# Patient Record
Sex: Male | Born: 1965
Health system: Southern US, Community
[De-identification: ages and names within clinical notes are randomized; demographics above are authoritative.]

## PROBLEM LIST (undated history)

## (undated) DIAGNOSIS — K219 Gastro-esophageal reflux disease without esophagitis: Secondary | ICD-10-CM

## (undated) DIAGNOSIS — R0600 Dyspnea, unspecified: Secondary | ICD-10-CM

## (undated) DIAGNOSIS — E119 Type 2 diabetes mellitus without complications: Secondary | ICD-10-CM

## (undated) DIAGNOSIS — I10 Essential (primary) hypertension: Secondary | ICD-10-CM

## (undated) DIAGNOSIS — J449 Chronic obstructive pulmonary disease, unspecified: Secondary | ICD-10-CM

## (undated) DIAGNOSIS — Z720 Tobacco use: Secondary | ICD-10-CM

## (undated) DIAGNOSIS — I509 Heart failure, unspecified: Secondary | ICD-10-CM

## (undated) DIAGNOSIS — I2510 Atherosclerotic heart disease of native coronary artery without angina pectoris: Secondary | ICD-10-CM

## (undated) DIAGNOSIS — I2102 ST elevation (STEMI) myocardial infarction involving left anterior descending coronary artery: Secondary | ICD-10-CM

## (undated) DIAGNOSIS — F1410 Cocaine abuse, uncomplicated: Secondary | ICD-10-CM

---

## 1998-04-16 ENCOUNTER — Emergency Department (HOSPITAL_COMMUNITY): Admission: EM | Admit: 1998-04-16 | Discharge: 1998-04-16 | Payer: Self-pay | Admitting: Emergency Medicine

## 2005-11-15 ENCOUNTER — Emergency Department (HOSPITAL_COMMUNITY): Admission: EM | Admit: 2005-11-15 | Discharge: 2005-11-15 | Payer: Self-pay | Admitting: Emergency Medicine

## 2005-11-16 ENCOUNTER — Ambulatory Visit (HOSPITAL_COMMUNITY): Admission: RE | Admit: 2005-11-16 | Discharge: 2005-11-16 | Payer: Self-pay | Admitting: *Deleted

## 2010-11-17 ENCOUNTER — Emergency Department (HOSPITAL_COMMUNITY)
Admission: EM | Admit: 2010-11-17 | Discharge: 2010-11-18 | Payer: Self-pay | Source: Home / Self Care | Admitting: Emergency Medicine

## 2011-03-10 LAB — HEPATITIS C ANTIBODY (REFLEX)

## 2011-03-10 LAB — HIV ANTIBODY (ROUTINE TESTING W REFLEX): HIV: NONREACTIVE

## 2011-03-10 LAB — HEPATITIS B SURFACE ANTIGEN: Hepatitis B Surface Ag: NEGATIVE

## 2011-05-15 NOTE — Op Note (Signed)
Tyler Wong, Tyler Wong NO.:  [id number]   MEDICAL RECORD NO.:  [id number]          PATIENT TYPE:  AMB   LOCATION:  SDS                          FACILITY:  [facility]   PHYSICIAN:  Tennis Must Meyerdierks, M.D.DATE OF BIRTH:  [birth year]   DATE OF PROCEDURE:  11/16/2005  DATE OF DISCHARGE:  11/16/2005                                 OPERATIVE REPORT   PREOPERATIVE DIAGNOSIS:  Lacerated flexor digitorum profundus and  superficialis tendons with lacerated ulnar digital nerve, right small  finger.   POSTOPERATIVE DIAGNOSIS:  Lacerated flexor digitorum profundus and  superficialis tendons with lacerated ulnar digital nerve, right small  finger.   PROCEDURE:  Repair of flexor digitorum profundus and superficialis tendons  with repair of ulnar digital nerve, right small finger.   SURGEON:  Lowell Bouton, M.D.   ANESTHESIA:  General.   OPERATIVE FINDINGS:  The patient had complete transection of the tendons at  the level of A3.  The ulnar digital nerve was divided at the same level and  the radial neurovascular bundle was intact.  The ulnar digital artery was  also transected, but was not repaired.   PROCEDURE:  Under general anesthesia, with a tourniquet on the right arm,  the right hand was prepped and draped in the usual fashion.  After elevating  the limb, the tourniquet was inflated to 250 mmHg.  The previous sutures  were removed from a transverse laceration over the proximal phalanx; the  laceration was extended in a zigzag fashion proximally and distally.  Blunt  dissection was carried down to the flexor tendon sheath.  The tendons were  retrieved proximally with a fine hemostat and held out to length with a 21-  gauge needle in the tendon sheath.  The distal end of the tendons were  identified after flexing the tip and a transverse incision was made at the  level of the proximal end of A2 to expose the stump of the profundus and  superficialis.  The  proximal quarter of the A2 was opened to allow for the  repair.  A 3-0 Ethibond Kessler suture was placed in the profundus stump  distally.  A 3entered the hospital Kessler suture was placed in the  profundus proximally and then in the superficialis proximally.  Those  tendons were then passed beneath the remaining pulley at A3, out to the  level of the repair.  The superficialis was repaired using the Kessler type  suture of 3-0 Ethibond.  The profundus had a core suture inserted in  addition to the Kessler sutures using a 3-0 Ethibond and the repair was  performed without difficulty.  There was good gliding of the tendon beneath  the pulleys.  The microscope was then brought into the field and the ulnar  digital nerve was identified.  It was trimmed back with a scissors to good  fascicles.  A 9-0 nylon suture was used to perform an epineurial repair.  Five sutures were inserted in the nerve.  The microscope was then brought  out of the field, the radial digit to the ulnar  digital artery was  coagulated and the radial neurovascular bundle was intact.  The wound  was irrigated with saline and the skin was closed with 4-0 nylon sutures.  Sterile dressings were applied followed by a dorsal splint.  The patient  tolerated the procedure well.  He was given a 0.5% Marcaine digital block  for pain control.  He went to the recovery room, awake and stable, in good  condition.      Lowell Bouton, M.D.  Electronically Signed     EMM/MEDQ  D:  11/16/2005  T:  11/17/2005  Job:  [id number]

## 2014-07-17 ENCOUNTER — Emergency Department (HOSPITAL_COMMUNITY): Payer: Self-pay

## 2014-07-17 ENCOUNTER — Encounter (HOSPITAL_COMMUNITY)
Admission: EM | Disposition: A | Discharge status: Home / Self Care | Payer: Self-pay | Source: Home / Self Care | Attending: Cardiovascular Disease

## 2014-07-17 ENCOUNTER — Encounter (HOSPITAL_COMMUNITY): Payer: Self-pay | Admitting: Emergency Medicine

## 2014-07-17 ENCOUNTER — Inpatient Hospital Stay (HOSPITAL_COMMUNITY)
Admission: EM | Admit: 2014-07-17 | Discharge: 2014-07-20 | DRG: 246 | Disposition: A | Discharge status: Home / Self Care | Payer: Self-pay | Attending: Cardiovascular Disease | Admitting: Cardiovascular Disease

## 2014-07-17 DIAGNOSIS — Z7982 Long term (current) use of aspirin: Secondary | ICD-10-CM

## 2014-07-17 DIAGNOSIS — Z720 Tobacco use: Secondary | ICD-10-CM

## 2014-07-17 DIAGNOSIS — Z8249 Family history of ischemic heart disease and other diseases of the circulatory system: Secondary | ICD-10-CM

## 2014-07-17 DIAGNOSIS — I5021 Acute systolic (congestive) heart failure: Secondary | ICD-10-CM | POA: Diagnosis present

## 2014-07-17 DIAGNOSIS — I2582 Chronic total occlusion of coronary artery: Secondary | ICD-10-CM | POA: Diagnosis present

## 2014-07-17 DIAGNOSIS — Z79899 Other long term (current) drug therapy: Secondary | ICD-10-CM

## 2014-07-17 DIAGNOSIS — F1410 Cocaine abuse, uncomplicated: Secondary | ICD-10-CM | POA: Diagnosis present

## 2014-07-17 DIAGNOSIS — I2102 ST elevation (STEMI) myocardial infarction involving left anterior descending coronary artery: Secondary | ICD-10-CM

## 2014-07-17 DIAGNOSIS — Z7902 Long term (current) use of antithrombotics/antiplatelets: Secondary | ICD-10-CM

## 2014-07-17 DIAGNOSIS — Z9861 Coronary angioplasty status: Secondary | ICD-10-CM

## 2014-07-17 DIAGNOSIS — F1490 Cocaine use, unspecified, uncomplicated: Secondary | ICD-10-CM

## 2014-07-17 DIAGNOSIS — I472 Ventricular tachycardia, unspecified: Secondary | ICD-10-CM | POA: Diagnosis not present

## 2014-07-17 DIAGNOSIS — E785 Hyperlipidemia, unspecified: Secondary | ICD-10-CM | POA: Diagnosis present

## 2014-07-17 DIAGNOSIS — I4729 Other ventricular tachycardia: Secondary | ICD-10-CM | POA: Diagnosis not present

## 2014-07-17 DIAGNOSIS — I213 ST elevation (STEMI) myocardial infarction of unspecified site: Secondary | ICD-10-CM

## 2014-07-17 DIAGNOSIS — I219 Acute myocardial infarction, unspecified: Secondary | ICD-10-CM

## 2014-07-17 DIAGNOSIS — F17200 Nicotine dependence, unspecified, uncomplicated: Secondary | ICD-10-CM | POA: Diagnosis present

## 2014-07-17 DIAGNOSIS — I2510 Atherosclerotic heart disease of native coronary artery without angina pectoris: Secondary | ICD-10-CM

## 2014-07-17 DIAGNOSIS — I509 Heart failure, unspecified: Secondary | ICD-10-CM | POA: Diagnosis present

## 2014-07-17 DIAGNOSIS — I2109 ST elevation (STEMI) myocardial infarction involving other coronary artery of anterior wall: Principal | ICD-10-CM | POA: Diagnosis present

## 2014-07-17 DIAGNOSIS — I10 Essential (primary) hypertension: Secondary | ICD-10-CM | POA: Diagnosis present

## 2014-07-17 HISTORY — DX: Cocaine abuse, uncomplicated: F14.10

## 2014-07-17 HISTORY — DX: Atherosclerotic heart disease of native coronary artery without angina pectoris: I25.10

## 2014-07-17 HISTORY — DX: ST elevation (STEMI) myocardial infarction involving left anterior descending coronary artery: I21.02

## 2014-07-17 HISTORY — DX: Tobacco use: Z72.0

## 2014-07-17 HISTORY — PX: PERCUTANEOUS CORONARY STENT INTERVENTION (PCI-S): SHX5485

## 2014-07-17 HISTORY — PX: LEFT HEART CATH: SHX5478

## 2014-07-17 LAB — POCT I-STAT TROPONIN I: Troponin i, poc: 0.24 ng/mL (ref 0.00–0.08)

## 2014-07-17 SURGERY — LEFT HEART CATH
Anesthesia: LOCAL

## 2014-07-17 MED ORDER — NITROGLYCERIN IN D5W 200-5 MCG/ML-% IV SOLN
INTRAVENOUS | Status: AC
  Filled 2014-07-17: qty 250

## 2014-07-17 MED ORDER — HEPARIN (PORCINE) IN NACL 2-0.9 UNIT/ML-% IJ SOLN
INTRAMUSCULAR | Status: AC
  Filled 2014-07-17: qty 1500

## 2014-07-17 MED ORDER — ASPIRIN 325 MG PO TABS
325.0000 mg | ORAL_TABLET | ORAL | Status: DC
  Filled 2014-07-17: qty 1

## 2014-07-17 MED ORDER — HEPARIN SODIUM (PORCINE) 5000 UNIT/ML IJ SOLN
4000.0000 [IU] | Freq: Once | INTRAMUSCULAR | Status: AC
  Administered 2014-07-17: 4000 [IU] via INTRAVENOUS
  Filled 2014-07-17: qty 1

## 2014-07-17 MED ORDER — NITROGLYCERIN 1 MG/10 ML FOR IR/CATH LAB
INTRA_ARTERIAL | Status: AC
Start: 2014-07-17 — End: 2014-07-17
  Filled 2014-07-17: qty 10

## 2014-07-17 MED ORDER — NITROGLYCERIN 1 MG/10 ML FOR IR/CATH LAB
INTRA_ARTERIAL | Status: AC
  Filled 2014-07-17: qty 10

## 2014-07-17 MED ORDER — FENTANYL CITRATE 0.05 MG/ML IJ SOLN
INTRAMUSCULAR | Status: AC
  Filled 2014-07-17: qty 2

## 2014-07-17 MED ORDER — BIVALIRUDIN 250 MG IV SOLR
INTRAVENOUS | Status: AC
  Filled 2014-07-17: qty 250

## 2014-07-17 MED ORDER — VERAPAMIL HCL 2.5 MG/ML IV SOLN
INTRAVENOUS | Status: AC
  Filled 2014-07-17: qty 2

## 2014-07-17 MED ORDER — MIDAZOLAM HCL 2 MG/2ML IJ SOLN
INTRAMUSCULAR | Status: AC
Start: 2014-07-17 — End: 2014-07-17
  Filled 2014-07-17: qty 2

## 2014-07-17 MED ORDER — ASPIRIN 81 MG PO CHEW
324.0000 mg | CHEWABLE_TABLET | Freq: Once | ORAL | Status: AC
  Administered 2014-07-17: 324 mg via ORAL
  Filled 2014-07-17: qty 4

## 2014-07-17 MED ORDER — HEPARIN (PORCINE) IN NACL 100-0.45 UNIT/ML-% IJ SOLN
1000.0000 [IU]/h | INTRAMUSCULAR | Status: DC
  Filled 2014-07-17: qty 250

## 2014-07-17 MED ORDER — LIDOCAINE HCL (PF) 1 % IJ SOLN
INTRAMUSCULAR | Status: AC
  Filled 2014-07-17: qty 30

## 2014-07-17 MED ORDER — MIDAZOLAM HCL 2 MG/2ML IJ SOLN
INTRAMUSCULAR | Status: AC
  Filled 2014-07-17: qty 2

## 2014-07-17 NOTE — ED Notes (Signed)
To Cath lab 

## 2014-07-17 NOTE — ED Notes (Signed)
EKG obtained and shown to Dr. Darl Householder. Repeat one obtained per MD.

## 2014-07-17 NOTE — ED Notes (Addendum)
Cardiology fellow at bedside. EDP and resident at bedside. Pt placed on cath lab pads.

## 2014-07-17 NOTE — ED Notes (Addendum)
Pt arrived back to room, being placed on monitor, IV being placed.

## 2014-07-17 NOTE — ED Notes (Signed)
Pt c/o non radiating left sided chest pain starting two hours ago. Pt reports pain as burning. Pt also c/o right arm numbness with the pain. Pt reports some shortness of breath and nausea associated with the pain.

## 2014-07-17 NOTE — H&P (Signed)
Tyler Wong is an 48 y.o. male.     Chief Complaint: chest pain HPI: Tyler Wong is a 47 yo man with no PMH, tobacco use ongoing, intermittent cocaine use and a mother that had an MI at unknown age who presents with 3 hours of chest pain. STEMI called on presentation by ER. He tells me his chest pain began 3 hours ago at rest, somewhat was with moving around. He's quite stoic but on deep questioning he'll answer. His spouse and daughter accompany him. He called his wife to come home and take him to the ER because he felt unwell.  He tells me nothing has improved his pain and he characterizes it more as burning substernal pain than pressure. He says last use of cocaine 24 hours ago.   History reviewed. No pertinent past medical history.  No past surgical history on file.  Family History  Problem Relation Age of Onset  . Heart attack Mother    Social History:  reports that he has been smoking.  He does not have any smokeless tobacco history on file. He reports that he uses illicit drugs (Cocaine). His alcohol history is not on file.  Allergies: No Known Allergies  No prescriptions prior to admission    Results for orders placed during the hospital encounter of 07/17/14 (from the past 48 hour(s))  POCT I-STAT TROPONIN I     Status: Abnormal   Collection Time    07/17/14 11:18 PM      Result Value Ref Range   Troponin i, poc 0.24 (*) 0.00 - 0.08 ng/mL   Comment NOTIFIED PHYSICIAN     Comment 3            Comment: Due to the release kinetics of cTnI,     a negative result within the first hours     of the onset of symptoms does not rule out     myocardial infarction with certainty.     If myocardial infarction is still suspected,     repeat the test at appropriate intervals.   Dg Chest Port 1 View  07/17/2014   CLINICAL DATA:  Chest pain with shortness of breath.  EXAM: PORTABLE CHEST - 1 VIEW  COMPARISON:  None.  FINDINGS: 2310 hr. The heart size and mediastinal contours are  normal. The lungs are clear. There is no pleural effusion or pneumothorax. No acute osseous findings are identified. Telemetry leads overlie the chest.  IMPRESSION: No active cardiopulmonary process.   Electronically Signed   By: Camie Patience M.D.   On: 07/17/2014 23:31    Review of Systems  Constitutional: Negative for fever, chills and weight loss.  HENT: Negative for ear discharge and hearing loss.   Eyes: Negative for double vision and pain.  Respiratory: Positive for shortness of breath. Negative for hemoptysis and sputum production.   Cardiovascular: Positive for chest pain. Negative for orthopnea and leg swelling.  Gastrointestinal: Negative for nausea, vomiting and abdominal pain.  Genitourinary: Negative for dysuria and hematuria.  Musculoskeletal: Negative for myalgias and neck pain.  Skin: Negative for itching and rash.  Neurological: Negative for dizziness, tingling and tremors.  Endo/Heme/Allergies: Negative for polydipsia. Does not bruise/bleed easily.  Psychiatric/Behavioral: Positive for substance abuse. Negative for suicidal ideas and hallucinations. The patient is not nervous/anxious.     Blood pressure 165/111, pulse 95, temperature 97.9 F (36.6 C), resp. rate 18, SpO2 99.00%. Physical Exam  Nursing note and vitals reviewed. Constitutional: He is oriented to person,  place, and time. He appears well-developed and well-nourished. He appears distressed.  HENT:  Head: Normocephalic and atraumatic.  Nose: Nose normal.  Mouth/Throat: Oropharynx is clear and moist. No oropharyngeal exudate.  Eyes: Conjunctivae and EOM are normal. Pupils are equal, round, and reactive to light. No scleral icterus.  Neck: Normal range of motion. Neck supple. No JVD present. No tracheal deviation present.  Cardiovascular: Normal rate, regular rhythm, normal heart sounds and intact distal pulses.  Exam reveals no gallop.   No murmur heard. Respiratory: Effort normal and breath sounds normal.  No respiratory distress. He has no wheezes. He has no rales.  GI: Soft. Bowel sounds are normal. He exhibits no distension. There is no tenderness. There is no rebound.  Musculoskeletal: Normal range of motion. He exhibits no edema and no tenderness.  Neurological: He is alert and oriented to person, place, and time. No cranial nerve deficit. Coordination normal.  Skin: Skin is warm and dry. No rash noted. He is not diaphoretic. No erythema.  Psychiatric: He has a normal mood and affect. His behavior is normal. Thought content normal.  labs pending ECG reviewed; anteroseptal infarct, ST elevation V2-V5 myocardial injury, NSR  Assessment/Plan Tyler Wong is a 48 yo man with no PMH, tobacco use and cocaine use who presents with 3 hours of chest pain. ECG consistent with evolving ST elevation MI. Cath lab activated immediately and LHC/PCI in progress with left dominant system and 100% mLAD lesion noted. Patient and family updated of need for emergent LHC and likely PCI.  1. STEMI: LHC/PCI currently of mLAD. Received aspirin 324 mg, heparin 4000 units. P2Y12 choice pending. Ticagrelor 90 mg bid written for.  - admit to ICU, daily aspirin 81 mg daily, P2Y12 ticagrelor 90 mg bid.  - atorvastatin 80 mg qHS first dose now - metoprolol 12.5 mg bid  2. Tobacco abuse: smoking cessation, nicotine patch if needed 3. Cocaine abuse: drug and alcohol counseling, urine tox   Tyler Wong 07/17/2014, 11:50 PM

## 2014-07-18 ENCOUNTER — Encounter (HOSPITAL_COMMUNITY): Payer: Self-pay | Admitting: Cardiology

## 2014-07-18 DIAGNOSIS — I10 Essential (primary) hypertension: Secondary | ICD-10-CM

## 2014-07-18 DIAGNOSIS — I2109 ST elevation (STEMI) myocardial infarction involving other coronary artery of anterior wall: Principal | ICD-10-CM

## 2014-07-18 DIAGNOSIS — Z9861 Coronary angioplasty status: Secondary | ICD-10-CM

## 2014-07-18 DIAGNOSIS — F1410 Cocaine abuse, uncomplicated: Secondary | ICD-10-CM

## 2014-07-18 DIAGNOSIS — I059 Rheumatic mitral valve disease, unspecified: Secondary | ICD-10-CM

## 2014-07-18 DIAGNOSIS — F17200 Nicotine dependence, unspecified, uncomplicated: Secondary | ICD-10-CM

## 2014-07-18 DIAGNOSIS — E785 Hyperlipidemia, unspecified: Secondary | ICD-10-CM

## 2014-07-18 DIAGNOSIS — I2510 Atherosclerotic heart disease of native coronary artery without angina pectoris: Secondary | ICD-10-CM

## 2014-07-18 HISTORY — PX: CORONARY ANGIOPLASTY WITH STENT PLACEMENT: SHX49

## 2014-07-18 LAB — TROPONIN I
Troponin I: 5.6 ng/mL (ref <0.30)
Troponin I: >20 ng/mL (ref <0.30)
Troponin I: >20 ng/mL (ref <0.30)

## 2014-07-18 LAB — CBC
HCT: 47.8 % (ref 39.0–52.0)
Hemoglobin: 16 g/dL (ref 13.0–17.0)
MCH: 30.7 pg (ref 26.0–34.0)
MCHC: 33.5 g/dL (ref 30.0–36.0)
MCV: 91.6 fL (ref 78.0–100.0)
Platelets: 235 10*3/uL (ref 150–400)
RBC: 5.22 MIL/uL (ref 4.22–5.81)
RDW: 15.2 % (ref 11.5–15.5)
WBC: 12.8 10*3/uL — ABNORMAL HIGH (ref 4.0–10.5)

## 2014-07-18 LAB — HEMOGLOBIN A1C
Hgb A1c MFr Bld: 5.8 % — ABNORMAL HIGH (ref <5.7)
Mean Plasma Glucose: 120 mg/dL — ABNORMAL HIGH (ref <117)

## 2014-07-18 LAB — BASIC METABOLIC PANEL
Anion gap: 15 (ref 5–15)
BUN: 20 mg/dL (ref 6–23)
CO2: 23 mEq/L (ref 19–32)
Calcium: 9.2 mg/dL (ref 8.4–10.5)
Chloride: 103 mEq/L (ref 96–112)
Creatinine, Ser: 1.05 mg/dL (ref 0.50–1.35)
GFR calc Af Amer: >90 mL/min (ref >90)
GFR calc non Af Amer: 82 mL/min — ABNORMAL LOW (ref >90)
Glucose, Bld: 143 mg/dL — ABNORMAL HIGH (ref 70–99)
Potassium: 4.6 mEq/L (ref 3.7–5.3)
Sodium: 141 mEq/L (ref 137–147)

## 2014-07-18 LAB — COMPREHENSIVE METABOLIC PANEL
ALT: 20 U/L (ref 0–53)
AST: 72 U/L — ABNORMAL HIGH (ref 0–37)
Albumin: 3.6 g/dL (ref 3.5–5.2)
Alkaline Phosphatase: 88 U/L (ref 39–117)
Anion gap: 15 (ref 5–15)
BUN: 16 mg/dL (ref 6–23)
CO2: 22 mEq/L (ref 19–32)
Calcium: 8.6 mg/dL (ref 8.4–10.5)
Chloride: 101 mEq/L (ref 96–112)
Creatinine, Ser: 0.82 mg/dL (ref 0.50–1.35)
GFR calc Af Amer: >90 mL/min (ref >90)
GFR calc non Af Amer: >90 mL/min (ref >90)
Glucose, Bld: 139 mg/dL — ABNORMAL HIGH (ref 70–99)
Potassium: 4.5 mEq/L (ref 3.7–5.3)
Sodium: 138 mEq/L (ref 137–147)
Total Bilirubin: 0.4 mg/dL (ref 0.3–1.2)
Total Protein: 6.8 g/dL (ref 6.0–8.3)

## 2014-07-18 LAB — LIPID PANEL
Cholesterol: 209 mg/dL — ABNORMAL HIGH (ref 0–200)
HDL: 85 mg/dL (ref >39)
LDL Cholesterol: 109 mg/dL — ABNORMAL HIGH (ref 0–99)
Total CHOL/HDL Ratio: 2.5 RATIO
Triglycerides: 77 mg/dL (ref <150)
VLDL: 15 mg/dL (ref 0–40)

## 2014-07-18 LAB — PRO B NATRIURETIC PEPTIDE: Pro B Natriuretic peptide (BNP): 441.2 pg/mL — ABNORMAL HIGH (ref 0–125)

## 2014-07-18 LAB — RAPID URINE DRUG SCREEN, HOSP PERFORMED
Amphetamines: NOT DETECTED
Barbiturates: NOT DETECTED
Benzodiazepines: POSITIVE — AB
Cocaine: POSITIVE — AB
Opiates: NOT DETECTED
Tetrahydrocannabinol: NOT DETECTED

## 2014-07-18 LAB — TSH: TSH: 0.382 u[IU]/mL (ref 0.350–4.500)

## 2014-07-18 LAB — MRSA PCR SCREENING: MRSA by PCR: NEGATIVE

## 2014-07-18 LAB — MAGNESIUM: Magnesium: 2 mg/dL (ref 1.5–2.5)

## 2014-07-18 LAB — POCT ACTIVATED CLOTTING TIME: Activated Clotting Time: 95 seconds

## 2014-07-18 MED ORDER — SODIUM CHLORIDE 0.9 % IV SOLN
1.7500 mg/kg/h | INTRAVENOUS | Status: AC
  Administered 2014-07-18: 1.75 mg/kg/h via INTRAVENOUS
  Filled 2014-07-18 (×2): qty 250

## 2014-07-18 MED ORDER — TICAGRELOR 90 MG PO TABS
90.0000 mg | ORAL_TABLET | Freq: Two times a day (BID) | ORAL | Status: DC
  Administered 2014-07-18 – 2014-07-20 (×5): 90 mg via ORAL
  Filled 2014-07-18 (×6): qty 1

## 2014-07-18 MED ORDER — BIVALIRUDIN 250 MG IV SOLR
INTRAVENOUS | Status: AC
  Filled 2014-07-18: qty 250

## 2014-07-18 MED ORDER — ATORVASTATIN CALCIUM 80 MG PO TABS
80.0000 mg | ORAL_TABLET | Freq: Every day | ORAL | Status: DC
  Administered 2014-07-18 – 2014-07-19 (×2): 80 mg via ORAL
  Filled 2014-07-18 (×3): qty 1

## 2014-07-18 MED ORDER — LORAZEPAM 2 MG/ML IJ SOLN
1.0000 mg | INTRAMUSCULAR | Status: DC | PRN
  Administered 2014-07-18: 1 mg via INTRAVENOUS
  Filled 2014-07-18: qty 1

## 2014-07-18 MED ORDER — ONDANSETRON HCL 4 MG/2ML IJ SOLN: 4.0000 mg | Freq: Four times a day (QID) | INTRAMUSCULAR | Status: DC | PRN

## 2014-07-18 MED ORDER — ASPIRIN EC 81 MG PO TBEC: 81.0000 mg | DELAYED_RELEASE_TABLET | Freq: Every day | ORAL | Status: DC

## 2014-07-18 MED ORDER — ASPIRIN 300 MG RE SUPP: 300.0000 mg | RECTAL | Status: DC

## 2014-07-18 MED ORDER — ACETAMINOPHEN 325 MG PO TABS: 650.0000 mg | ORAL_TABLET | ORAL | Status: DC | PRN

## 2014-07-18 MED ORDER — ASPIRIN EC 81 MG PO TBEC
81.0000 mg | DELAYED_RELEASE_TABLET | Freq: Every day | ORAL | Status: DC
  Administered 2014-07-18 – 2014-07-20 (×3): 81 mg via ORAL
  Filled 2014-07-18 (×3): qty 1

## 2014-07-18 MED ORDER — ATORVASTATIN CALCIUM 80 MG PO TABS: 80.0000 mg | ORAL_TABLET | Freq: Every day | ORAL | Status: DC

## 2014-07-18 MED ORDER — NITROGLYCERIN 0.4 MG SL SUBL: 0.4000 mg | SUBLINGUAL_TABLET | SUBLINGUAL | Status: DC | PRN

## 2014-07-18 MED ORDER — SODIUM CHLORIDE 0.9 % IV SOLN
INTRAVENOUS | Status: DC
Start: 2014-07-18 — End: 2014-07-20
  Administered 2014-07-18 (×2): via INTRAVENOUS

## 2014-07-18 MED ORDER — NITROGLYCERIN IN D5W 200-5 MCG/ML-% IV SOLN: 2.0000 ug/min | INTRAVENOUS | Status: DC

## 2014-07-18 MED ORDER — TICAGRELOR 90 MG PO TABS
ORAL_TABLET | ORAL | Status: AC
  Filled 2014-07-18: qty 2

## 2014-07-18 MED ORDER — LISINOPRIL 2.5 MG PO TABS
2.5000 mg | ORAL_TABLET | Freq: Every day | ORAL | Status: DC
  Filled 2014-07-18: qty 1

## 2014-07-18 MED ORDER — ASPIRIN 81 MG PO CHEW: 324.0000 mg | CHEWABLE_TABLET | ORAL | Status: DC

## 2014-07-18 MED ORDER — LISINOPRIL 5 MG PO TABS
5.0000 mg | ORAL_TABLET | Freq: Every day | ORAL | Status: DC
  Administered 2014-07-18 – 2014-07-20 (×3): 5 mg via ORAL
  Filled 2014-07-18 (×3): qty 1

## 2014-07-18 MED ORDER — METOPROLOL TARTRATE 12.5 MG HALF TABLET
12.5000 mg | ORAL_TABLET | Freq: Two times a day (BID) | ORAL | Status: DC
  Administered 2014-07-18 (×2): 12.5 mg via ORAL
  Filled 2014-07-18 (×5): qty 1

## 2014-07-18 MED FILL — Sodium Chloride IV Soln 0.9%: INTRAVENOUS | Qty: 50 | Status: AC

## 2014-07-18 NOTE — Progress Notes (Signed)
Paged Dr. Jules Husbands to update on pt status.  Advised pt right femoral site was still bleeding.  Advised Angiomaxx gtt was turned off as ordered.  Advised received critical troponin of 5.60.

## 2014-07-18 NOTE — Progress Notes (Deleted)
Subjective: No Chest pain or SOB, no new complaints currently.   Objective: Vital signs in last 24 hours: Temp:  [97.9 F (36.6 C)-98.8 F (37.1 C)] 98.8 F (37.1 C) (07/22 0832) Pulse Rate:  [67-116] 116 (07/22 0830) Resp:  [4-27] 19 (07/22 0830) BP: (128-174)/(80-120) 128/80 mmHg (07/22 0830) SpO2:  [97 %-100 %] 99 % (07/22 0830) Weight:  [176 lb 9.4 oz (80.1 kg)] 176 lb 9.4 oz (80.1 kg) (07/22 0419) Weight change:    Intake/Output from previous day: 07/21 0701 - 07/22 0700 In: 142 [I.V.:142] Out: 500 [Urine:500] Intake/Output this shift: Total I/O In: 12 [I.V.:12] Out: -   PE: General:Pleasant affect, NAD Skin:Warm and dry, brisk capillary refill HEENT:normocephalic, sclera clear, mucus membranes moist Neck:supple, no JVD, no bruits  Heart:S1S2 RRR without murmur, gallup, rub or click Lungs:clear without rales, rhonchi, or wheezes NID:POEU, non tender, + BS, do not palpate liver spleen or masses Ext:no lower ext edema, 2+ pedal pulses, 2+ radial pulses Neuro:sleepy but oriented, MAE, follows commands, + facial symmetry Lab Results:  Recent Labs  07/18/14 0255  WBC 12.8*  HGB 16.0  HCT 47.8  PLT 235   BMET  Recent Labs  07/17/14 2315 07/18/14 0255  NA 141 138  K 4.6 4.5  CL 103 101  CO2 23 22  GLUCOSE 143* 139*  BUN 20 16  CREATININE 1.05 0.82  CALCIUM 9.2 8.6    Recent Labs  07/18/14 0255  TROPONINI 5.60*    Lab Results  Component Value Date   CHOL 209* 07/18/2014   HDL 85 07/18/2014   LDLCALC 109* 07/18/2014   TRIG 77 07/18/2014   CHOLHDL 2.5 07/18/2014   No results found for this basename: HGBA1C     Lab Results  Component Value Date   TSH 0.382 07/18/2014    Hepatic Function Panel  Recent Labs  07/18/14 0255  PROT 6.8  ALBUMIN 3.6  AST 72*  ALT 20  ALKPHOS 88  BILITOT 0.4    Recent Labs  07/18/14 0255  CHOL 209*   No results found for this basename: PROTIME,  in the last 72  hours     Studies/Results: Dg Chest Port 1 View  07/17/2014   CLINICAL DATA:  Chest pain with shortness of breath.  EXAM: PORTABLE CHEST - 1 VIEW  COMPARISON:  None.  FINDINGS: 2310 hr. The heart size and mediastinal contours are normal. The lungs are clear. There is no pleural effusion or pneumothorax. No acute osseous findings are identified. Telemetry leads overlie the chest.  IMPRESSION: No active cardiopulmonary process.   Electronically Signed   By: Camie Patience M.D.   On: 07/17/2014 23:31    Medications: I have reviewed the patient's current medications. Scheduled Meds: . aspirin EC  81 mg Oral Daily  . atorvastatin  80 mg Oral q1800  . metoprolol tartrate  12.5 mg Oral BID  . ticagrelor  90 mg Oral BID   Continuous Infusions: . sodium chloride 100 mL/hr at 07/18/14 0858  . nitroGLYCERIN 40 mcg/min (07/18/14 0800)   PRN Meds:.acetaminophen, LORazepam, nitroGLYCERIN, ondansetron (ZOFRAN) IV  Assessment/Plan: Principal Problem:   ST elevation (STEMI) myocardial infarction involving left anterior descending coronary artery-Troponin currently 5.6 patient stable no chest pain. s/p LAD stent EF 45% On beta blocker and currently weaning nitro drip, add ACE inhibitor Lisinopril 2.5mg  daily. BMET and CBC in the am. Active Problems:   STEMI (ST elevation myocardial infarction)   Tobacco abuse  Cocaine use   Hyperlipidemia Lipitor 80mg  added    LOS: 1 day   Time spent with pt. :15  minutes. [facility] R  Nurse Practitioner Certified Pager [phone] or after 5pm and on weekends call [phone] 07/18/2014, 9:49 AM

## 2014-07-18 NOTE — Plan of Care (Signed)
Problem: Phase I Progression Outcomes Goal: Vascular site scale level 0 - I Vascular Site Scale Level 0: No bruising/bleeding/hematoma Level I (Mild): Bruising/Ecchymosis, minimal bleeding/ooozing, palpable hematoma < 3 cm Level II (Moderate): Bleeding not affecting hemodynamic parameters, pseudoaneurysm, palpable hematoma > 3 cm Level III (Severe) Bleeding which affects hemodynamic parameters or retroperitoneal hemorrhage  Outcome: Not Progressing Right femoral site is oozy/bleeding.  Dr. Jules Husbands aware.

## 2014-07-18 NOTE — Progress Notes (Signed)
Assessed pt right femoral cath site.  Site bleeding.  Dressing saturated and blood leaking out from dressing.  Updated Dr. Jules Husbands.  Received order from PRN Ativan to help pt rest better and remain still as pt has been very restless and has been moving right leg significantly.  Will initiate orders and continue to monitor.

## 2014-07-18 NOTE — CV Procedure (Addendum)
Tyler Wong is a 48 y.o. male    [id number]  [id number] LOCATION:  FACILITY: [place]  PHYSICIAN: [name], MD, [facility] December 20, 1966   DATE OF PROCEDURE:  07/18/2014    EMERGENCY CARDIAC CATHETERIZATION/PERCUTANEOUS CORONARY INTERVENTION     HISTORY:    Tyler Wong is a 48 y.o. male who denies prior cardiac history.  The patient has a history of tobacco, as well as cocaine use at apparently had taken cocaine yesterday.  This evening, he developed severe substernal chest pain.  He presented to the emergency room and his ECG showed Q waves V1 through V4, but with ST elevation.  ST segment elevation was dynamic period is now brought to the cardiac catheterization laboratory with a diagnosis of anterior wall ST segment elevation myocardial infarction.   PROCEDURE: Emergent cardiac catheterization: Coronary angiography, left larvae, percutaneous coronary intervention with PTCA and DES stenting of a totally occluded LAD.  The patient was brought to the cardiac catheterization laboratory and was still complaining of 5/10 chest pain.  In the emergency room he had received 4000 units of intravenous heparin, and 4 baby aspirin. He  was premedicated with Versed 2 mg and fentanyl 50 mcg. His right groin was prepped and shaved in usual sterile fashion. Xylocaine 1% was used for local anesthesia. A 6 French sheath was inserted into the R femoral artery. Diagnostic catheterizatiion was done with 5 [place] LF4, FR4, and pigtail catheters.  With the demonstration of total, the occlusion with TIMI 0 flow beyond the proximal septal perforator arteries.  The decision was made to attempt acute intervention.  IV nitroglycerin was started and titrated up to 30 mcg.  Bivalirudin bolus plus infusion was administered.  Brilinta 180 mg was given orally.  A 6 French XB LAD 3.5 guide was used.  An [employer]ahi medium wire was able to cross the total occlusion per and emerge 2.5x15 mm balloon was inserted and multiple dilatations  were made in the proximal-mid to mid LAD region.  A resolute integrity DES stent 3.5x3, 4 mm, was then inserted and covered on the septal vessels to the mid LAD.  This was dilated at 12 and 13 atmospheres.  An and see you for a 4.0x27 mm balloon was used for post stent dilatation with stent taper from 3.90-3.78 mm.  Scout angiography confirmed an excellent angiographic result.  The pigtail catheter was then inserted and left ventriculography was done with 25 cc Omnipaque contrast.  The arterial sheath was sutured in place with plans for Angiomax infusion to run 4 hours post procedure. The patient tolerated the procedure well and left the catheterization laboratory, chest pain-free with stable hemodynamics.  The   HEMODYNAMICS:   Central Aorta: 160/112   Left Ventricle: 160/19   ANGIOGRAPHY:  Left main: Angiographically normal vessel, which trifurcated into the LAD, ramus intermediate, and dominant left circumflex coronary artery   LAD: A 30% proximal narrowing prior to the 2 septal perforating arteries.  The LAD was totally occluded after this second proximal septal perforating artery.  There was TIMI 0 flow  Ramus Intermediate:  Angiographically normal vessel, which bifurcated in its midsegment  Left circumflex: Large, dominant circumflex vessel, which gave rise to a bifurcating marginal and ended in 3 branches supplying the posterior descending and posterolateral as well as inferolateral vessels.   Right coronary artery: Nondominant normal vessel  Following successful percutaneous coronary intervention with PTCA and proximal to mid and mid LAD with ultimate stenting with a 3.5x34 mm Resolute integrity DES stent, postdilated  to 3.9 mm, tapering to 3.7, 8 mm, the entire stented segment was reduced to 0%.  The LAD was large caliber and was a large vessel, which extended and wrapped around the LV apex.  The vessel, supplying 2 additional more distal diagonal vessels and several smaller septal  perforating arteries.  Left ventriculography revealed mild to moderate acute left ventricular dysfunction with mid distal, apical and infero-apical hypocontractility.  Ejection fraction is approximately 45%    IMPRESSION:  Acute ST segment elevation anterior wall myocardial infarction secondary to proximal-mid LAD occlusion.  Normal ramus intermediate vessel, dominant, left circumflex coronary artery, and nondominant right coronary arteries.  Mild/moderate acute left ventricular dysfunction with hypokinesis of the mid distal anterolateral wall and significant hypo-kinesis involving the apex and distal inferoapical segment.  Ejection fraction approximately 45%.  Successful PCI of the totally occluded LAD with PTCA/DES stenting with a resolute 3.5x34 mm stent, postdilated to 3.9 mm, tapering to 3.7, 8 mm.   Angiomax/180 mg oral Brilinta/IC and IV nitroglycerin  RECOMMENDATION:  Patient will be maintained on Angiomax 4 hours post this ST segment elevation MI treated with DES stenting.  Lifestyle modification is imperative with discontinuance of cocaine.  He will require dual antiplatelet therapy for a minimum of a year.  ACE inhibition/beta blocker therapy will be initiated and titrated as tolerated along with aggressive statin therapy.  [name], MD, [facility] 07/18/2014 12:42 AM

## 2014-07-18 NOTE — Progress Notes (Signed)
TELEMETRY: Reviewed telemetry pt in NSR, one 7 beat run NSVT: Filed Vitals:   07/18/14 0900 07/18/14 0930 07/18/14 1000 07/18/14 1030  BP: 143/87 140/78 143/81 127/70  Pulse: 73 73 68 78  Temp:      TempSrc:      Resp: 20 0 20 19  Height:      Weight:      SpO2: 99% 98% 98% 97%    Intake/Output Summary (Last 24 hours) at 07/18/14 1039 Last data filed at 07/18/14 1015  Gross per 24 hour  Intake 199.13 ml  Output   1000 ml  Net -800.87 ml   Filed Weights   07/18/14 0118 07/18/14 0419  Weight: 176 lb 9.4 oz (80.1 kg) 176 lb 9.4 oz (80.1 kg)    Subjective Denies any chest pain. Feels OK. Stoic. States he had chest pain for 5-6 hours before presentation.   Marland Kitchen aspirin EC  81 mg Oral Daily  . atorvastatin  80 mg Oral q1800  . lisinopril  5 mg Oral Daily  . metoprolol tartrate  12.5 mg Oral BID  . ticagrelor  90 mg Oral BID   . sodium chloride 100 mL/hr at 07/18/14 0858  . nitroGLYCERIN 30 mcg/min (07/18/14 1015)    LABS: Basic Metabolic Panel:  Recent Labs  07/17/14 2315 07/18/14 0255  NA 141 138  K 4.6 4.5  CL 103 101  CO2 23 22  GLUCOSE 143* 139*  BUN 20 16  CREATININE 1.05 0.82  CALCIUM 9.2 8.6  MG  --  2.0   Liver Function Tests:  Recent Labs  07/18/14 0255  AST 72*  ALT 20  ALKPHOS 88  BILITOT 0.4  PROT 6.8  ALBUMIN 3.6   No results found for this basename: LIPASE, AMYLASE,  in the last 72 hours CBC:  Recent Labs  07/18/14 0255  WBC 12.8*  HGB 16.0  HCT 47.8  MCV 91.6  PLT 235   Cardiac Enzymes:  Recent Labs  07/18/14 0255  TROPONINI 5.60*   BNP:  Recent Labs  07/18/14 0255  PROBNP 441.2*   D-Dimer: No results found for this basename: DDIMER,  in the last 72 hours Hemoglobin A1C:  Recent Labs  07/18/14 0255  HGBA1C 5.8*   Fasting Lipid Panel:  Recent Labs  07/18/14 0255  CHOL 209*  HDL 85  LDLCALC 109*  TRIG 77  CHOLHDL 2.5   Thyroid Function Tests:  Recent Labs  07/18/14 0255  TSH 0.382      Radiology/Studies:  Dg Chest Port 1 View  07/17/2014   CLINICAL DATA:  Chest pain with shortness of breath.  EXAM: PORTABLE CHEST - 1 VIEW  COMPARISON:  None.  FINDINGS: 2310 hr. The heart size and mediastinal contours are normal. The lungs are clear. There is no pleural effusion or pneumothorax. No acute osseous findings are identified. Telemetry leads overlie the chest.  IMPRESSION: No active cardiopulmonary process.   Electronically Signed   By: Camie Patience M.D.   On: 07/17/2014 23:31   Ecg: NSR with evolving anterior MI. Q waves. ST elevation persistent but improved. PHYSICAL EXAM General: Well developed, well nourished, in no acute distress. Head: Normocephalic, atraumatic, sclera non-icteric, oropharynx is clear Neck: Negative for carotid bruits. JVD not elevated. No adenopathy Lungs: Clear bilaterally to auscultation without wheezes, rales, or rhonchi. Breathing is unlabored. Heart: RRR S1 S2 without murmurs, rubs, or gallops.  Abdomen: Soft, non-tender, non-distended with normoactive bowel sounds. No hepatomegaly. No rebound/guarding. No obvious abdominal masses.  Msk:  Strength and tone appears normal for age. Extremities: No clubbing, cyanosis or edema.  Distal pedal pulses are 2+ and equal bilaterally. No right groin hematoma. Neuro: Alert and oriented X 3. Moves all extremities spontaneously. Psych:  Responds to questions appropriately with a normal affect.  ASSESSMENT AND PLAN: 1. Anterior STEMI. S/p DES of mid LAD. Needs DAPT for at least one year. Will need to use beta blocker cautiously given history of cocaine use. Continue low dose for now. Initiate ACEi. Wean IV Ntg as tolerated. Will keep in unit today.  2. Acute systolic CHF. EF 45% by cath. Will check Echo today. On ACEi and beta blocker  3. Polysubstance abuse. Tobacco and Cocaine. UDS pending. Counseled on importance of cessation.  4. HTN. BP improving. Wean IV Ntg and titrate ACEi today.  5. Dyslipidemia. On  high dose statin.  Present on Admission:  . STEMI (ST elevation myocardial infarction) . Tobacco abuse . Cocaine use . ST elevation (STEMI) myocardial infarction involving left anterior descending coronary artery  Signed, Peter Martinique, [place] 07/18/2014 10:39 AM

## 2014-07-18 NOTE — Care Management Note (Addendum)
    Page 1 of 1   07/20/2014     11:25:42 AM CARE MANAGEMENT NOTE 07/20/2014  Patient:  Tyler Wong, Tyler Wong   Account Number:  [id number]  Date Initiated:  07/18/2014  Documentation initiated by:  Cacie Gaskins  Subjective/Objective Assessment:   dx STEMI     DC Planning Services  CM consult  Medication [facility]      Status of service:  Completed, signed off  Discharge Disposition:  HOME/SELF CARE  Per UR Regulation:  Reviewed for med. necessity/level of care/duration of stay  Comments:  07/20/14 [place] patient assistance program application  for [employer] and [employer] faxed to [employer].  Pt has good support from dtr and sister and has a follow-up appt with [facility].  07/19/14 [place] MSN BSN CCM Application for [place] patient assistance program on shadow chart.  Lisinopril and carvedilol are on [employer] $4 list.  Lovastatin is also on [employer] $4 list - atorvastatin is $30 for a 30-day supply.  07/18/14 1200 [place] MSN BSN CCM Provided pt with pharmaceutical patient assistance application for [place] and card for 30-day free trial. Sister present and will assist with completion of application.  Also provided pt with contact information for [facility] and [facility] and [facility].  Pt to call chosen clinic and arrange appts for orange card application and hospital f/u.

## 2014-07-18 NOTE — ED Provider Notes (Signed)
CSN: [id number]     Arrival date & time 07/17/14  2234 History   First MD Initiated Contact with Patient 07/17/14 2309     Chief Complaint  Patient presents with  . Chest Pain  . Code STEMI     (Consider location/radiation/quality/duration/timing/severity/associated sxs/prior Treatment) HPI Comments: 48 y/o man with no PMH, tobacco use ongoing, intermittent cocaine use and a mother that had an MI at unknown age who presents with 3 hours of chest pain. Pt reports that the pain is sharp, pressure like. Pain started about 3 hours ago, while he was walking, and has been constant, and mild-moderately severe. He has had some chest pains intermittently over the past week. Pt has hx of tobacco use, intermittent cocaine use. + dyspnea.  Patient is a 48 y.o. male presenting with chest pain. The history is provided by the patient.  Chest Pain Associated symptoms: shortness of breath   Associated symptoms: no abdominal pain, no cough, no dizziness, no fever and no headache     Past Medical History  Diagnosis Date  . CAD in native artery 07/17/14    STEMI- LAD stenosis with DES  . ST elevation myocardial infarction (STEMI) involving left anterior descending (LAD) coronary artery with complication   . Cocaine abuse   . Tobacco abuse 07/17/2014   Past Surgical History  Procedure Laterality Date  . Coronary angioplasty with stent placement  07/18/14    resolute DES to LAD STEMI   Family History  Problem Relation Age of Onset  . Heart attack Mother    History  Substance Use Topics  . Smoking status: Current Every Day Smoker  . Smokeless tobacco: Not on file  . Alcohol Use: Not on file    Review of Systems  Unable to perform ROS: Acuity of condition  Constitutional: Negative for fever and activity change.  Eyes: Negative for visual disturbance.  Respiratory: Positive for shortness of breath. Negative for cough.   Cardiovascular: Positive for chest pain.  Gastrointestinal: Negative for  abdominal pain.  Musculoskeletal: Negative for neck pain.  Neurological: Negative for dizziness, light-headedness and headaches.      Allergies  Review of patient's allergies indicates no known allergies.  Home Medications   Prior to Admission medications   Not on File   BP 140/88  Pulse 77  Temp(Src) 98.1 F (36.7 C) (Oral)  Resp 20  Ht 5' 10.5" (1.791 m)  Wt 176 lb 9.4 oz (80.1 kg)  BMI 24.97 kg/m2  SpO2 99% Physical Exam  Nursing note and vitals reviewed. Constitutional: He is oriented to person, place, and time. He appears well-developed.  HENT:  Head: Normocephalic and atraumatic.  Eyes: Conjunctivae and EOM are normal. Pupils are equal, round, and reactive to light.  Neck: Normal range of motion. Neck supple.  Cardiovascular: Normal rate, regular rhythm and intact distal pulses.   Pulmonary/Chest: Effort normal and breath sounds normal.  Abdominal: Soft. Bowel sounds are normal. He exhibits no distension. There is no tenderness. There is no rebound and no guarding.  Neurological: He is alert and oriented to person, place, and time.  Skin: Skin is warm.    ED Course  Procedures (including critical care time) Labs Review Labs Reviewed  BASIC METABOLIC PANEL - Abnormal; Notable for the following:    Glucose, Bld 143 (*)    GFR calc non Af Amer 82 (*)    All other components within normal limits  TROPONIN I - Abnormal; Notable for the following:    Troponin  I 5.60 (*)    All other components within normal limits  COMPREHENSIVE METABOLIC PANEL - Abnormal; Notable for the following:    Glucose, Bld 139 (*)    AST 72 (*)    All other components within normal limits  PRO B NATRIURETIC PEPTIDE - Abnormal; Notable for the following:    Pro B Natriuretic peptide (BNP) 441.2 (*)    All other components within normal limits  HEMOGLOBIN A1C - Abnormal; Notable for the following:    Hemoglobin A1C 5.8 (*)    Mean Plasma Glucose 120 (*)    All other components within  normal limits  CBC - Abnormal; Notable for the following:    WBC 12.8 (*)    All other components within normal limits  LIPID PANEL - Abnormal; Notable for the following:    Cholesterol 209 (*)    LDL Cholesterol 109 (*)    All other components within normal limits  URINE RAPID DRUG SCREEN (HOSP PERFORMED) - Abnormal; Notable for the following:    Cocaine POSITIVE (*)    Benzodiazepines POSITIVE (*)    All other components within normal limits  POCT I-STAT TROPONIN I - Abnormal; Notable for the following:    Troponin i, poc 0.24 (*)    All other components within normal limits  MRSA PCR SCREENING  TSH  MAGNESIUM  TROPONIN I  TROPONIN I  I-STAT TROPOININ, ED  POCT ACTIVATED CLOTTING TIME    Imaging Review Dg Chest Port 1 View  07/17/2014   CLINICAL DATA:  Chest pain with shortness of breath.  EXAM: PORTABLE CHEST - 1 VIEW  COMPARISON:  None.  FINDINGS: 2310 hr. The heart size and mediastinal contours are normal. The lungs are clear. There is no pleural effusion or pneumothorax. No acute osseous findings are identified. Telemetry leads overlie the chest.  IMPRESSION: No active cardiopulmonary process.   Electronically Signed   By: Camie Patience M.D.   On: 07/17/2014 23:31     EKG Interpretation None      Date: 07/18/2014  Rate: 73  Rhythm: normal sinus rhythm  QRS Axis: normal  Intervals: normal  ST/T Wave abnormalities: ST elevations anteriorly and acute myocardial infarction  Conduction Disutrbances:none  Narrative Interpretation:   Old EKG Reviewed: none available    MDM   Final diagnoses:  ST elevation myocardial infarction (STEMI), unspecified artery    Pt comes in with cc of chest pain. Pt's triage EKG showed concerning ST elevation in the anterior leads, with q waves in the same region. Pt was roomed as soon as possible after the EKG was seen by the EDP, and STEMI was activated. Dr. Claiborne Billings from Cards at bedside immediately, and communicated directly with the  cath team.  Pt was given heparin and plavix. Stable for cath lab.  CRITICAL CARE Performed by: Varney Biles   Total critical care time: 40 minutes  Critical care time was exclusive of separately billable procedures and treating other patients.  Critical care was necessary to treat or prevent imminent or life-threatening deterioration.  Critical care was time spent personally by me on the following activities: development of treatment plan with patient and/or surrogate as well as nursing, discussions with consultants, evaluation of patient's response to treatment, examination of patient, obtaining history from patient or surrogate, ordering and performing treatments and interventions, ordering and review of laboratory studies, ordering and review of radiographic studies, pulse oximetry and re-evaluation of patient's condition.   Varney Biles, MD 07/18/14 [phone]

## 2014-07-18 NOTE — Progress Notes (Signed)
CRITICAL VALUE ALERT  Critical value received:  Troponin 5.60  Date of notification:  07/18/14  Time of notification:  0412  Critical value read back:Yes.    Nurse who received alert:  Vivia Ewing, RN  MD notified (1st page):  Dr. Jules Husbands  Time of first page:  [phone]  Responding MD:  Dr. Jules Husbands  Time MD responded:  0500

## 2014-07-18 NOTE — Progress Notes (Signed)
Order for sheath removal verified per post procedural orders. Procedure explained to patient and Rt femoral artery access site assessed: level 0, palpable dorsalis pedis and posterior tibial pulses. 6 French Sheath removed and manual pressure applied for30 minutes. Pre, peri, & post procedural vitals: HR 74, RR 16, O2 Sat upper 90's, BP128/80, Pain 0. Distal pulses remained intact after sheath removal. Access site level 0 and dressed with 4X4 gauze and tegaderm. Haskell Flirt, RN confirmed condition of site. Post procedural instructions discussed with return demonstration from patient.

## 2014-07-18 NOTE — Progress Notes (Signed)
Pt ACT =95.  Called cath lab to have someone come to pull sheath.  Barbaraann Rondo from cath lab advised it would be about an hour before someone could come over.  Will pass information along to day shift.  Pt having to reminded to keep leg straight and not move around.  Will continue to monitor.

## 2014-07-18 NOTE — Progress Notes (Signed)
EKG CRITICAL VALUE     12 lead EKG performed.  Critical value noted.  Vivia Ewing   RN notified.   Nayib Remer, CCT 07/18/2014 7:51 AM

## 2014-07-18 NOTE — Progress Notes (Signed)
  Echocardiogram 2D Echocardiogram has been performed.  Tyler Wong 07/18/2014, 4:56 PM

## 2014-07-18 NOTE — Progress Notes (Signed)
Pt restless since arrival to SICU.  Right femoral cath site oozy and bleeding.  Per verbal order 1mg  IV Ativan given.  Will continue to monitor.

## 2014-07-19 DIAGNOSIS — I5021 Acute systolic (congestive) heart failure: Secondary | ICD-10-CM

## 2014-07-19 DIAGNOSIS — I219 Acute myocardial infarction, unspecified: Secondary | ICD-10-CM

## 2014-07-19 DIAGNOSIS — I509 Heart failure, unspecified: Secondary | ICD-10-CM

## 2014-07-19 LAB — BASIC METABOLIC PANEL
Anion gap: 10 (ref 5–15)
BUN: 13 mg/dL (ref 6–23)
CO2: 28 mEq/L (ref 19–32)
Calcium: 9 mg/dL (ref 8.4–10.5)
Chloride: 101 mEq/L (ref 96–112)
Creatinine, Ser: 0.97 mg/dL (ref 0.50–1.35)
GFR calc Af Amer: >90 mL/min (ref >90)
GFR calc non Af Amer: >90 mL/min (ref >90)
Glucose, Bld: 120 mg/dL — ABNORMAL HIGH (ref 70–99)
Potassium: 4.2 mEq/L (ref 3.7–5.3)
Sodium: 139 mEq/L (ref 137–147)

## 2014-07-19 LAB — CBC
HCT: 45.7 % (ref 39.0–52.0)
Hemoglobin: 15.3 g/dL (ref 13.0–17.0)
MCH: 29.9 pg (ref 26.0–34.0)
MCHC: 33.5 g/dL (ref 30.0–36.0)
MCV: 89.4 fL (ref 78.0–100.0)
Platelets: 236 10*3/uL (ref 150–400)
RBC: 5.11 MIL/uL (ref 4.22–5.81)
RDW: 15.1 % (ref 11.5–15.5)
WBC: 12.3 10*3/uL — ABNORMAL HIGH (ref 4.0–10.5)

## 2014-07-19 MED ORDER — CARVEDILOL 6.25 MG PO TABS
6.2500 mg | ORAL_TABLET | Freq: Two times a day (BID) | ORAL | Status: DC
  Administered 2014-07-19 – 2014-07-20 (×3): 6.25 mg via ORAL
  Filled 2014-07-19 (×5): qty 1

## 2014-07-19 MED FILL — Sodium Chloride IV Soln 0.9%: INTRAVENOUS | Qty: 50 | Status: AC

## 2014-07-19 NOTE — Progress Notes (Signed)
APPLICATION FOR [facility] PATIENT ASSISTANCE PROGRAM ON SHADOW CHART.  PT HAS 30-DAY FREE TRIAL CARD.  LISINOPRIL AND CARVEDILOL ARE ON [facility] $4 LIST.  LOVASTATIN IS ALSO ON [employer] $4 LIST - ATORVASTATIN IS $30 FOR A 30-DAY SUPPLY.

## 2014-07-19 NOTE — Progress Notes (Signed)
Patient Name: Tyler Wong Date of Encounter: 07/19/2014  Principal Problem:   ST elevation (STEMI) myocardial infarction involving left anterior descending coronary artery Active Problems:   STEMI (ST elevation myocardial infarction)   Tobacco abuse   Cocaine use    Patient Profile: 48 yo male w/ hx Tob, FH CAD, intermittent cocaine use admitted 07/21 w/ STEMI.  SUBJECTIVE: No chest pain, no SOB  OBJECTIVE Filed Vitals:   07/19/14 0500 07/19/14 0600 07/19/14 0700 07/19/14 0800  BP: 106/83 137/89 130/78   Pulse: 89 70 83   Temp:    97.8 F (36.6 C)  TempSrc:    Oral  Resp: 20 18 15    Height:      Weight:      SpO2: 97% 97% 98%     Intake/Output Summary (Last 24 hours) at 07/19/14 0859 Last data filed at 07/19/14 0700  Gross per 24 hour  Intake 1658.38 ml  Output   3000 ml  Net -1341.62 ml   Filed Weights   07/18/14 0118 07/18/14 0419 07/19/14 0455  Weight: 176 lb 9.4 oz (80.1 kg) 176 lb 9.4 oz (80.1 kg) 173 lb 15.1 oz (78.9 kg)    PHYSICAL EXAM General: Well developed, well nourished, male in no acute distress. Head: Normocephalic, atraumatic.  Neck: Supple without bruits, JVD not elevated Lungs:  Resp regular and unlabored, CTA. Heart: RRR, S1, S2, no S3, S4, soft murmur; no rub. Abdomen: Soft, non-tender, non-distended, BS + x 4.  Extremities: No clubbing, cyanosis, no edema. Right groin without hematoma or bruit. Neuro: Alert and oriented X 3. Moves all extremities spontaneously. Psych: Normal affect.  LABS: CBC: Recent Labs  07/18/14 0255 07/19/14 0221  WBC 12.8* 12.3*  HGB 16.0 15.3  HCT 47.8 45.7  MCV 91.6 89.4  PLT 235 544   Basic Metabolic Panel: Recent Labs  07/17/14 2315 07/18/14 0255 07/19/14 0221  NA 141 138 139  K 4.6 4.5 4.2  CL 103 101 101  CO2 23 22 28   GLUCOSE 143* 139* 120*  BUN 20 16 13   CREATININE 1.05 0.82 0.97  CALCIUM 9.2 8.6 9.0  MG  --  2.0  --    Liver Function Tests: Recent Labs  07/18/14 0255  AST  72*  ALT 20  ALKPHOS 88  BILITOT 0.4  PROT 6.8  ALBUMIN 3.6   Cardiac Enzymes: Recent Labs  07/18/14 0255 07/18/14 1430 07/18/14 2042  TROPONINI 5.60* >20.00* >20.00*    Recent Labs  07/17/14 2318  TROPIPOC 0.24*   BNP: Pro B Natriuretic peptide (BNP)  Date/Time Value Ref Range Status  07/18/2014  2:55 AM 441.2* 0 - 125 pg/mL Final   Hemoglobin A1C: Recent Labs  07/18/14 0255  HGBA1C 5.8*   Fasting Lipid Panel: Recent Labs  07/18/14 0255  CHOL 209*  HDL 85  LDLCALC 109*  TRIG 77  CHOLHDL 2.5   Thyroid Function Tests: Recent Labs  07/18/14 0255  TSH 0.382   Drugs of Abuse     Component Value Date/Time   LABOPIA NONE DETECTED 07/18/2014 1000   COCAINSCRNUR POSITIVE* 07/18/2014 1000   LABBENZ POSITIVE* 07/18/2014 1000   AMPHETMU NONE DETECTED 07/18/2014 1000   THCU NONE DETECTED 07/18/2014 1000   LABBARB NONE DETECTED 07/18/2014 1000     TELE:  SR w/ occ PVCs      ECHO: 07/18/2014 Study Conclusions - Left ventricle: The cavity size was normal. Systolic function was mildly to moderately reduced. The estimated ejection fraction was in  the range of 40% to 45%. There is akinesis of the mid-apical anteroseptal, distal septal, distal lateral myocardium. There is akinesis of the apical myocardium. - Mitral valve: There was mild regurgitation. - Tricuspid valve: There was mild regurgitation.   Radiology/Studies: Dg Chest Port 1 View 07/17/2014   CLINICAL DATA:  Chest pain with shortness of breath.  EXAM: PORTABLE CHEST - 1 VIEW  COMPARISON:  None.  FINDINGS: 2310 hr. The heart size and mediastinal contours are normal. The lungs are clear. There is no pleural effusion or pneumothorax. No acute osseous findings are identified. Telemetry leads overlie the chest.  IMPRESSION: No active cardiopulmonary process.   Electronically Signed   By: Camie Patience M.D.   On: 07/17/2014 23:31     Current Medications:  . aspirin EC  81 mg Oral Daily  . atorvastatin  80 mg  Oral q1800  . lisinopril  5 mg Oral Daily  . metoprolol tartrate  12.5 mg Oral BID  . ticagrelor  90 mg Oral BID   . sodium chloride 100 mL/hr at 07/18/14 0858  . nitroGLYCERIN Stopped (07/18/14 1800)    ASSESSMENT AND PLAN: Principal Problem:   ST elevation (STEMI) myocardial infarction involving left anterior descending coronary artery - s/p DES LAD, EF 45%, continue ASA, statin, change BB to Coreg.  Active Problems:   Tobacco abuse/Cocaine use  - cessation encouraged for both  Plan - tx telemetry, rehab and care management to see, possible d/c in am if does well.  Signed, Rosaria Ferries , PA-C 8:59 AM 07/19/2014 Patient seen and examined and history reviewed. Agree with above findings and plan. Feeling well. No chest pain or SOB. Ecg shows evolving anteroseptal MI with Q waves and marked T wave inversion. EF 40-45% by Echo. One 4 beat run of NSVT noted on monitor. Will transfer to telemetry today. Switch metoprolol to coreg 6.25 mg bid. Continue ASA, Brilinta, ACEi, and statin. Stressed importance to smoking and Cocaine cessation. Will need to investigate drug assistance program to help with meds. Patient has no insurance and is currently unemployed.   Tyler Wong, [place] 07/19/2014 9:57 AM

## 2014-07-19 NOTE — Progress Notes (Signed)
CARDIAC REHAB PHASE I   PRE:  Rate/Rhythm: 76 SR  BP:  Supine:   Sitting: 112/88  Standing:    SaO2: 100 RA  MODE:  Ambulation: 890 ft   POST:  Rate/Rhythm: 84  BP:  Supine:   Sitting: 126/73  Standing:    SaO2: 100 RA 1330-1440 Pt tolerated ambulation well without c/o of cp or SOB. VS stable Pt back to recliner after walk with call light in reach. Completed MI and stent education with pt. He voices understanding. We discussed smoking cessation. He wants to quit, but knows that it is going to be hard for him. I gave him tips for quitting, quit smart class information and coaching contact number. We discussed Outpt. CRP, he does not drive and would not have transportation to get here. I encouraged pt to watch MI video on TV and showed him how to get it on. He was able to return information with teach  Back.  Rodney Langton RN 07/19/2014 2:42 PM

## 2014-07-19 NOTE — Progress Notes (Signed)
EKG CRITICAL VALUE     12 lead EKG performed.  Critical value noted.  Beckie Salts, RN notified.   Brinly Maietta C, CCT 07/19/2014 8:00 AM

## 2014-07-19 NOTE — Plan of Care (Signed)
Problem: Phase I Progression Outcomes Goal: Anginal pain relieved Outcome: Completed/Met Date Met:  07/19/14 s/p PCI  Problem: Phase II Progression Outcomes Goal: Anginal pain relieved Outcome: Completed/Met Date Met:  07/19/14 s/p PCI Goal: CV Risk Factors identified Outcome: Completed/Met Date Met:  07/19/14 H/o cocaine use  Problem: Phase III Progression Outcomes Goal: No anginal pain Outcome: Completed/Met Date Met:  07/19/14 S/p PCI

## 2014-07-19 NOTE — Progress Notes (Signed)
Utilization Review Completed.Donne Anon T7/23/2015

## 2014-07-20 ENCOUNTER — Encounter (HOSPITAL_COMMUNITY): Payer: Self-pay | Admitting: Physician Assistant

## 2014-07-20 MED ORDER — ATORVASTATIN CALCIUM 80 MG PO TABS: 80.0000 mg | ORAL_TABLET | Freq: Every day | ORAL | Status: DC

## 2014-07-20 MED ORDER — TICAGRELOR 90 MG PO TABS: 90.0000 mg | ORAL_TABLET | Freq: Two times a day (BID) | ORAL | Status: DC

## 2014-07-20 MED ORDER — CARVEDILOL 12.5 MG PO TABS: 12.5000 mg | ORAL_TABLET | Freq: Two times a day (BID) | ORAL | Status: DC

## 2014-07-20 MED ORDER — NITROGLYCERIN 0.4 MG SL SUBL: 0.4000 mg | SUBLINGUAL_TABLET | SUBLINGUAL | Status: DC | PRN

## 2014-07-20 MED ORDER — LISINOPRIL 5 MG PO TABS: 5.0000 mg | ORAL_TABLET | Freq: Every day | ORAL | Status: DC

## 2014-07-20 MED ORDER — ASPIRIN 81 MG PO TABS: 81.0000 mg | ORAL_TABLET | Freq: Every day | ORAL | Status: DC

## 2014-07-20 MED ORDER — CARVEDILOL 12.5 MG PO TABS
12.5000 mg | ORAL_TABLET | Freq: Two times a day (BID) | ORAL | Status: DC
  Filled 2014-07-20 (×2): qty 1

## 2014-07-20 NOTE — Discharge Instructions (Signed)
PLEASE REMEMBER TO BRING ALL OF YOUR MEDICATIONS TO EACH OF YOUR FOLLOW-UP OFFICE VISITS. ° °PLEASE ATTEND ALL SCHEDULED FOLLOW-UP APPOINTMENTS.  ° °Activity: Increase activity slowly as tolerated. You may shower, but no soaking baths (or swimming) for 1 week. No driving for 1 week. No lifting over 5 lbs for 2 weeks. No sexual activity for 1 week.  ° °You May Return to Work: in 3 weeks (if applicable) ° °Wound Care: You may wash cath site gently with soap and water. Keep cath site clean and dry. If you notice pain, swelling, bleeding or pus at your cath site, please call [phone]. ° ° ° °Cardiac Cath Site Care °Refer to this sheet in the next few weeks. These instructions provide you with information on caring for yourself after your procedure. Your caregiver may also give you more specific instructions. Your treatment has been planned according to current medical practices, but problems sometimes occur. Call your caregiver if you have any problems or questions after your procedure. °HOME CARE INSTRUCTIONS °· You may shower 24 hours after the procedure. Remove the bandage (dressing) and gently wash the site with plain soap and water. Gently pat the site dry.  °· Do not apply powder or lotion to the site.  °· Do not sit in a bathtub, swimming pool, or whirlpool for 5 to 7 days.  °· No bending, squatting, or lifting anything over 10 pounds (4.5 kg) as directed by your caregiver.  °· Inspect the site at least twice daily.  °· Do not drive home if you are discharged the same day of the procedure. Have someone else drive you.  °· You may drive 24 hours after the procedure unless otherwise instructed by your caregiver.  °What to expect: °· Any bruising will usually fade within 1 to 2 weeks.  °· Blood that collects in the tissue (hematoma) may be painful to the touch. It should usually decrease in size and tenderness within 1 to 2 weeks.  °SEEK IMMEDIATE MEDICAL CARE IF: °· You have unusual pain at the site or down the  affected limb.  °· You have redness, warmth, swelling, or pain at the site.  °· You have drainage (other than a small amount of blood on the dressing).  °· You have chills.  °· You have a fever or persistent symptoms for more than 72 hours.  °· You have a fever and your symptoms suddenly get worse.  °· Your leg becomes pale, cool, tingly, or numb.  °· You have heavy bleeding from the site. Hold pressure on the site.  °Document Released: 01/16/2011 Document Revised: 12/03/2011 Document Reviewed:  ° °

## 2014-07-20 NOTE — Progress Notes (Signed)
[id number] Cardiac Rehab On arrival pt asking some of the same things we discussed yesterday.He seems to have forgot some of the information. We reviewed information. He seems to be most concerned about his medications. I have ask his nurse to get case manager to stop back by to see him. Deon Pilling, RN 07/20/2014 8:54 AM

## 2014-07-20 NOTE — Progress Notes (Signed)
Patient Name: Tyler Wong Date of Encounter: 07/20/2014  Principal Problem:   ST elevation (STEMI) myocardial infarction involving left anterior descending coronary artery Active Problems:   STEMI (ST elevation myocardial infarction)   Tobacco abuse   Cocaine use    Patient Profile: 48 yo male w/ hx Tob, FH CAD, intermittent cocaine use admitted 07/21 w/ STEMI.  SUBJECTIVE: No chest pain, no SOB. Feels great. Wants a cigarette badly but is motivated to quit.  OBJECTIVE Filed Vitals:   07/19/14 0800 07/19/14 1213 07/19/14 2027 07/20/14 0640  BP:  112/88 121/90 120/88  Pulse:  85 77 72  Temp: 97.8 F (36.6 C) 97.8 F (36.6 C) 98.6 F (37 C) 97.7 F (36.5 C)  TempSrc: Oral Oral Oral Oral  Resp:  16 18 18   Height:      Weight:    169 lb 1.6 oz (76.703 kg)  SpO2:  98% 98% 99%    Intake/Output Summary (Last 24 hours) at 07/20/14 0821 Last data filed at 07/19/14 1800  Gross per 24 hour  Intake     20 ml  Output    750 ml  Net   -730 ml   Filed Weights   07/18/14 0419 07/19/14 0455 07/20/14 0640  Weight: 176 lb 9.4 oz (80.1 kg) 173 lb 15.1 oz (78.9 kg) 169 lb 1.6 oz (76.703 kg)    PHYSICAL EXAM General: Well developed, well nourished, male in no acute distress. Head: Normocephalic, atraumatic.  Neck: Supple without bruits, JVD not elevated Lungs:  Resp regular and unlabored, CTA. Heart: RRR, S1, S2, no S3, S4, soft murmur; no rub. Abdomen: Soft, non-tender, non-distended, BS + x 4.  Extremities: No clubbing, cyanosis, no edema. Right groin without hematoma or bruit. Neuro: Alert and oriented X 3. Moves all extremities spontaneously. Psych: Normal affect.  LABS: CBC:  Recent Labs  07/18/14 0255 07/19/14 0221  WBC 12.8* 12.3*  HGB 16.0 15.3  HCT 47.8 45.7  MCV 91.6 89.4  PLT 235 001   Basic Metabolic Panel:  Recent Labs  07/17/14 2315 07/18/14 0255 07/19/14 0221  NA 141 138 139  K 4.6 4.5 4.2  CL 103 101 101  CO2 23 22 28   GLUCOSE 143* 139*  120*  BUN 20 16 13   CREATININE 1.05 0.82 0.97  CALCIUM 9.2 8.6 9.0  MG  --  2.0  --    Liver Function Tests:  Recent Labs  07/18/14 0255  AST 72*  ALT 20  ALKPHOS 88  BILITOT 0.4  PROT 6.8  ALBUMIN 3.6   Cardiac Enzymes:  Recent Labs  07/18/14 0255 07/18/14 1430 07/18/14 2042  TROPONINI 5.60* >20.00* >20.00*    Recent Labs  07/17/14 2318  TROPIPOC 0.24*   BNP: Pro B Natriuretic peptide (BNP)  Date/Time Value Ref Range Status  07/18/2014  2:55 AM 441.2* 0 - 125 pg/mL Final   Hemoglobin A1C:  Recent Labs  07/18/14 0255  HGBA1C 5.8*   Fasting Lipid Panel:  Recent Labs  07/18/14 0255  CHOL 209*  HDL 85  LDLCALC 109*  TRIG 77  CHOLHDL 2.5   Thyroid Function Tests:  Recent Labs  07/18/14 0255  TSH 0.382   Drugs of Abuse     Component Value Date/Time   LABOPIA NONE DETECTED 07/18/2014 1000   COCAINSCRNUR POSITIVE* 07/18/2014 1000   LABBENZ POSITIVE* 07/18/2014 1000   AMPHETMU NONE DETECTED 07/18/2014 1000   THCU NONE DETECTED 07/18/2014 1000   LABBARB NONE DETECTED 07/18/2014 1000  TELE:  SR w/ rare PVCs      ECHO: 07/18/2014 Study Conclusions - Left ventricle: The cavity size was normal. Systolic function was mildly to moderately reduced. The estimated ejection fraction was in the range of 40% to 45%. There is akinesis of the mid-apical anteroseptal, distal septal, distal lateral myocardium. There is akinesis of the apical myocardium. - Mitral valve: There was mild regurgitation. - Tricuspid valve: There was mild regurgitation.   Radiology/Studies: Dg Chest Port 1 View 07/17/2014   CLINICAL DATA:  Chest pain with shortness of breath.  EXAM: PORTABLE CHEST - 1 VIEW  COMPARISON:  None.  FINDINGS: 2310 hr. The heart size and mediastinal contours are normal. The lungs are clear. There is no pleural effusion or pneumothorax. No acute osseous findings are identified. Telemetry leads overlie the chest.  IMPRESSION: No active cardiopulmonary  process.   Electronically Signed   By: Camie Patience M.D.   On: 07/17/2014 23:31   Ecg: Evolving changes of anterior MI  Current Medications:  . aspirin EC  81 mg Oral Daily  . atorvastatin  80 mg Oral q1800  . carvedilol  12.5 mg Oral BID WC  . lisinopril  5 mg Oral Daily  . ticagrelor  90 mg Oral BID   . sodium chloride 100 mL/hr at 07/18/14 3474    ASSESSMENT AND PLAN: Principal Problem:   ST elevation (STEMI) myocardial infarction involving left anterior descending coronary artery - s/p DES LAD, EF 45%, continue ASA, statin, Coreg. Will increase Coreg to 12.5. Continue ACEi.   Active Problems:   Tobacco abuse/Cocaine use  - cessation encouraged for both  Plan -DC home today.   Stressed importance to smoking and Cocaine cessation. Will need to investigate drug assistance program to help with meds. Can get Crestor 40 mg daily and Brilinta thru [employer] patient assistance. Other meds are on [facility] $4 plan. Patient has no insurance and is currently unemployed.   Kindel Rochefort [place], [place] 07/20/2014 8:21 AM

## 2014-07-20 NOTE — Progress Notes (Signed)
Patient given discharged instructions and prescription; all questions answered.  Patient discharged home.  IV and telemetry removed.  Patient escorted via wheelchair by volunteer to daughter's vehicle.

## 2014-07-20 NOTE — Discharge Summary (Signed)
Patient seen and examined and history reviewed. Agree with above findings and plan. See earlier rounding note.  Tyler Wong, Tyler Wong 07/20/2014 1:00 PM

## 2014-07-20 NOTE — Discharge Summary (Signed)
CARDIOLOGY DISCHARGE SUMMARY   Patient ID: Tyler Wong MRN: [id number] DOB/AGE: [birth year] 48 y.o.  Admit date: 07/17/2014 Discharge date: 07/20/2014  PCP: No PCP Per Patient Primary Cardiologist: Dr. Claiborne Billings  Primary Discharge Diagnosis: ST elevation (STEMI) myocardial infarction involving left anterior descending coronary artery - s/p 3.5x34 mm Resolute integrity DES stent to the LAD  Secondary Discharge Diagnosis:    STEMI (ST elevation myocardial infarction)   Tobacco abuse   Cocaine use  Procedures: Emergent cardiac catheterization: Coronary angiography, left larvae, percutaneous coronary intervention with PTCA and DES stenting of a totally occluded LAD, 2-D echocardiogram  Hospital Course: Tyler Wong is a 48 y.o. male with no history of CAD. He had chest pain for 3 hours, the day after using cocaine. He came to the emergency room. His ECG was consistent with a STEMI and he was taken emergently to the cath lab.  Cardiac catheterization results are below. He had single vessel disease with an LAD occlusion. This was treated with a drug-eluting stent. His EF was 45%. He tolerated the procedure well.  He was counseled on smoking cessation and cocaine cessation. The patient responded well and stated he thought he could quit cocaine and tobacco. He was seen by cardiac rehabilitation and educated on MI restrictions, heart healthy lifestyle modifications and exercise guidelines. Financial issues are significant so he is encouraged to follow up with cardiac rehabilitation but may not be able to afford it. Also because of financial issues, a Brilinta assistance form was filled out and signed. He was getting coupons to reduce the cost of his statin and will be on an inexpensive beta blocker/ACE inhibitor. If he is unable to afford the Brilinta, he can be changed to Plavix after the first 30 days.  On 07/23, he was seen by Dr. Martin[place] and all data were reviewed. No further inpatient workup is  indicated and he is considered stable for discharge, to follow up as an outpatient.  Labs:   Lab Results  Component Value Date   WBC 12.3* 07/19/2014   HGB 15.3 07/19/2014   HCT 45.7 07/19/2014   MCV 89.4 07/19/2014   PLT 236 07/19/2014    Recent Labs Lab 07/18/14 0255 07/19/14 0221  NA 138 139  K 4.5 4.2  CL 101 101  CO2 22 28  BUN 16 13  CREATININE 0.82 0.97  CALCIUM 8.6 9.0  PROT 6.8  --   BILITOT 0.4  --   ALKPHOS 88  --   ALT 20  --   AST 72*  --   GLUCOSE 139* 120*    Recent Labs  07/18/14 0255 07/18/14 1430 07/18/14 2042  TROPONINI 5.60* >20.00* >20.00*   Lipid Panel     Component Value Date/Time   CHOL 209* 07/18/2014 0255   TRIG 77 07/18/2014 0255   HDL 85 07/18/2014 0255   CHOLHDL 2.5 07/18/2014 0255   VLDL 15 07/18/2014 0255   LDLCALC 109* 07/18/2014 0255    Pro B Natriuretic peptide (BNP)  Date/Time Value Ref Range Status  07/18/2014  2:55 AM 441.2* 0 - 125 pg/mL Final   Drugs of Abuse     Component Value Date/Time   LABOPIA NONE DETECTED 07/18/2014 1000   COCAINSCRNUR POSITIVE* 07/18/2014 1000   LABBENZ POSITIVE* 07/18/2014 1000   AMPHETMU NONE DETECTED 07/18/2014 1000   THCU NONE DETECTED 07/18/2014 1000   LABBARB NONE DETECTED 07/18/2014 1000       Radiology: Dg Chest Port 1 View 07/17/2014   CLINICAL  DATA:  Chest pain with shortness of breath.  EXAM: PORTABLE CHEST - 1 VIEW  COMPARISON:  None.  FINDINGS: 2310 hr. The heart size and mediastinal contours are normal. The lungs are clear. There is no pleural effusion or pneumothorax. No acute osseous findings are identified. Telemetry leads overlie the chest.  IMPRESSION: No active cardiopulmonary process.   Electronically Signed   By: Camie Patience M.D.   On: 07/17/2014 23:31    Cardiac Cath: 07/18/2014 ANGIOGRAPHY:  Left main: Angiographically normal vessel, which trifurcated into the LAD, ramus intermediate, and dominant left circumflex coronary artery  LAD: A 30% proximal narrowing prior to the 2  septal perforating arteries. The LAD was totally occluded after this second proximal septal perforating artery. There was TIMI 0 flow  Ramus Intermediate: Angiographically normal vessel, which bifurcated in its midsegment  Left circumflex: Large, dominant circumflex vessel, which gave rise to a bifurcating marginal and ended in 3 branches supplying the posterior descending and posterolateral as well as inferolateral vessels.  Right coronary artery: Nondominant normal vessel  Following successful percutaneous coronary intervention with PTCA and proximal to mid and mid LAD with ultimate stenting with a , postdilated to 3.9 mm, tapering to 3.7, 8 mm, the entire stented segment was reduced to 0%. The LAD was large caliber and was a large vessel, which extended and wrapped around the LV apex. The vessel, supplying 2 additional more distal diagonal vessels and several smaller septal perforating arteries.  Left ventriculography revealed mild to moderate acute left ventricular dysfunction with mid distal, apical and infero-apical hypocontractility. Ejection fraction is approximately 45%  IMPRESSION:  Acute ST segment elevation anterior wall myocardial infarction secondary to proximal-mid LAD occlusion.  Normal ramus intermediate vessel, dominant, left circumflex coronary artery, and nondominant right coronary arteries.  Mild/moderate acute left ventricular dysfunction with hypokinesis of the mid distal anterolateral wall and significant hypo-kinesis involving the apex and distal inferoapical segment. Ejection fraction approximately 45%.  Successful PCI of the totally occluded LAD with PTCA/DES stenting with a resolute 3.5x34 mm stent, postdilated to 3.9 mm, tapering to 3.7, 8 mm.  Angiomax/180 mg oral Brilinta/IC and IV nitroglycerin  RECOMMENDATION:  Patient will be maintained on Angiomax 4 hours post this ST segment elevation MI treated with DES stenting. Lifestyle modification is imperative with  discontinuance of cocaine. He will require dual antiplatelet therapy for a minimum of a year. ACE inhibition/beta blocker therapy will be initiated and titrated as tolerated along with aggressive statin therapy.  EKG: 07/19/2014 Sinus rhythm, anterior ST and T wave changes consistent with evolving MI Vent. rate 69 BPM PR interval 150 ms QRS duration 82 ms QT/QTc 556/595 ms P-R-T axes 59 -12 112  Echo: 07/18/2014 Conclusions - Left ventricle: The cavity size was normal. Systolic function was mildly to moderately reduced. The estimated ejection fraction was in the range of 40% to 45%. There is akinesis of the mid-apical anteroseptal, distal septal, distal lateral myocardium. There is akinesis of the apical myocardium. - Mitral valve: There was mild regurgitation. - Tricuspid valve: There was mild regurgitation.   FOLLOW UP PLANS AND APPOINTMENTS No Known Allergies   Medication List         aspirin 81 MG tablet  Take 1 tablet (81 mg total) by mouth daily.     atorvastatin 80 MG tablet  Commonly known as:  LIPITOR  Take 1 tablet (80 mg total) by mouth daily.     carvedilol 12.5 MG tablet  Commonly known as:  COREG  Take  1 tablet (12.5 mg total) by mouth 2 (two) times daily with a meal.     lisinopril 5 MG tablet  Commonly known as:  PRINIVIL,ZESTRIL  Take 1 tablet (5 mg total) by mouth daily.     nitroGLYCERIN 0.4 MG SL tablet  Commonly known as:  NITROSTAT  Place 1 tablet (0.4 mg total) under the tongue every 5 (five) minutes as needed for chest pain.     ticagrelor 90 MG Tabs tablet  Commonly known as:  BRILINTA  Take 1 tablet (90 mg total) by mouth 2 (two) times daily.           Discharge Instructions   Diet - low sodium heart healthy    Complete by:  As directed      Increase activity slowly    Complete by:  As directed           Follow-up Information   Follow up with Lyda Jester, PA-C On 08/02/2014. (See for Dr. Claiborne Billings at 9:00 am)    Specialty:   Cardiology   Contact information:   [address] [place] [address] [phone]       BRING ALL MEDICATIONS WITH YOU TO FOLLOW UP APPOINTMENTS  Time spent with patient to include physician time: 44 min Signed: Rosaria Ferries, PA-C 07/20/2014, 9:43 AM Co-Sign MD

## 2014-08-02 ENCOUNTER — Ambulatory Visit (INDEPENDENT_AMBULATORY_CARE_PROVIDER_SITE_OTHER): Payer: Self-pay | Admitting: Cardiology

## 2014-08-02 ENCOUNTER — Encounter: Payer: Self-pay | Admitting: Cardiology

## 2014-08-02 VITALS — BP 126/88 | HR 66 | Ht 70.5 in | Wt 175.0 lb

## 2014-08-02 DIAGNOSIS — I2510 Atherosclerotic heart disease of native coronary artery without angina pectoris: Secondary | ICD-10-CM

## 2014-08-02 MED ORDER — TICAGRELOR 90 MG PO TABS: 90.0000 mg | ORAL_TABLET | Freq: Two times a day (BID) | ORAL | Status: DC

## 2014-08-02 NOTE — Progress Notes (Signed)
Patient ID: Tyler Wong, male   DOB: June 17, 1966, 48 y.o.   MRN: [id number]    08/02/2014 Tyler Wong   1966/05/20  [id number]  Primary Physicia No PCP Per Patient Primary Cardiologist: Dr. Claiborne Billings  HPI: The patient is a 48 year old African American male, who presents to clinic today for post hospital followup after recent hospitalization for ST elevation myocardial infarction. He was [facility] to [facility] on 07/17/2014. He presented with severe chest discomfort that occurred in the setting of recent cocaine use. He denied any prior cardiac history but also noted a history of tobacco abuse. On presentation to the emergency room, an EKG demonstrated Q waves in V1 through V4 as well as ST elevations. Subsequently, he was taken urgently to the cardiac catheterization laboratory for emergent PCI. The procedure was performed by Dr. Claiborne Billings. He was found to have a totally occluded LAD. This was successfully treated with PCI utilizing a drug-eluting stent. Ventricular ejection fraction was estimated at 45%. He was placed on dual antiplatelet therapy with aspirin plus Brilinta as well as a beta blocker, ACE inhibitor and statin. He was discharged home on 07/20/2014.  He presents to clinic today for posthospital followup. He states that he has done well since discharge. He denies any recurrent anginal symptoms and no dyspnea. He also denies any orthopnea, PND or lower extremity edema. He reports full medication compliance including a full compliance with dual antiplatelet therapy. He has not required any use of sublingual nitroglycerin. He denies any further cocaine use and also reports that he quit smoking after being discharged the hospital.   Current Outpatient Prescriptions  Medication Sig Dispense Refill  . aspirin 81 MG tablet Take 1 tablet (81 mg total) by mouth daily.      Marland Kitchen atorvastatin (LIPITOR) 80 MG tablet Take 1 tablet (80 mg total) by mouth daily.  30 tablet  11  . carvedilol (COREG) 12.5  MG tablet Take 1 tablet (12.5 mg total) by mouth 2 (two) times daily with a meal.  60 tablet  11  . lisinopril (PRINIVIL,ZESTRIL) 5 MG tablet Take 1 tablet (5 mg total) by mouth daily.  30 tablet  11  . nitroGLYCERIN (NITROSTAT) 0.4 MG SL tablet Place 1 tablet (0.4 mg total) under the tongue every 5 (five) minutes as needed for chest pain.  25 tablet  3  . ticagrelor (BRILINTA) 90 MG TABS tablet Take 1 tablet (90 mg total) by mouth 2 (two) times daily.  48 tablet  0   No current facility-administered medications for this visit.    No Known Allergies  History   Social History  . Marital Status: Single    Spouse Name: N/A    Number of Children: N/A  . Years of Education: N/A   Occupational History  . Landscaping    Social History Main Topics  . Smoking status: Former Smoker    Quit date: 07/17/2014  . Smokeless tobacco: Never Used  . Alcohol Use: No     Comment: Patient denies abuse, states social drinker  . Drug Use: No  . Sexual Activity: Not on file   Other Topics Concern  . Not on file   Social History Narrative  . No narrative on file     Review of Systems: General: negative for chills, fever, night sweats or weight changes.  Cardiovascular: negative for chest pain, dyspnea on exertion, edema, orthopnea, palpitations, paroxysmal nocturnal dyspnea or shortness of breath Dermatological: negative for rash Respiratory: negative for cough or wheezing Urologic:  negative for hematuria Abdominal: negative for nausea, vomiting, diarrhea, bright red blood per rectum, melena, or hematemesis Neurologic: negative for visual changes, syncope, or dizziness All other systems reviewed and are otherwise negative except as noted above.    Blood pressure 126/88, pulse 66, height 5' 10.5" (1.791 m), weight 175 lb (79.379 kg).  General appearance: alert, cooperative and no distress Neck: no carotid bruit and no JVD Lungs: clear to auscultation bilaterally Heart: regular rate and  rhythm, S1, S2 normal, no murmur, click, rub or gallop Extremities: no LEE Pulses: 2+ and symmetric Skin: warm and dry Neurologic: Grossly normal  EKG NSR 67 bpm  ASSESSMENT AND PLAN:   1. CAD: Status post STEMI, successfully treated with PCI plus drug-eluting stenting to the LAD. He denies any recurrent anginal symptoms. Continue dual antiplatelet therapy with aspirin plus Brilinta as well as beta blocker, ACE inhibitor and statin. Both blood pressure and heart rate are well controlled.  2. Tobacco abuse: Patient reports smoking cessation.  3. Cocaine abuse: Patient denies any further use since his MI. He was encouraged to continue to refrain from substance abuse.  PLAN  Patient appears to be doing well post discharge. He has not had any recurrent anginal symptoms. Blood pressure and heart rate are both well controlled. He has given up smoking as well as substance abuse with cocaine. He is on the appropriate medications with dual antiplatelet therapy, beta blocker therapy, ACE inhibitor and statin therapy. He has been instructed to continue his medications as directed. We discussed the importance of full daily compliance with dual antiplatelet therapy. He has been instructed to followup with Dr. Claiborne Billings in 4- 6 weeks for reassessment.  SIMMONS, BRITTAINYPA-C 08/02/2014 6:10 PM

## 2014-08-02 NOTE — Patient Instructions (Signed)
Your physician recommends that you schedule a follow-up appointment in: 6 weeks with Dr. Kelly.   

## 2014-08-03 ENCOUNTER — Encounter: Payer: Self-pay | Admitting: Cardiovascular Disease

## 2014-09-17 ENCOUNTER — Encounter: Payer: Self-pay | Admitting: Cardiovascular Disease

## 2014-09-17 ENCOUNTER — Telehealth: Payer: Self-pay | Admitting: *Deleted

## 2014-09-17 ENCOUNTER — Other Ambulatory Visit: Payer: Self-pay | Admitting: *Deleted

## 2014-09-17 ENCOUNTER — Ambulatory Visit (INDEPENDENT_AMBULATORY_CARE_PROVIDER_SITE_OTHER): Payer: Self-pay | Admitting: Cardiovascular Disease

## 2014-09-17 VITALS — BP 134/97 | HR 84 | Ht 70.5 in | Wt 169.4 lb

## 2014-09-17 DIAGNOSIS — F17200 Nicotine dependence, unspecified, uncomplicated: Secondary | ICD-10-CM

## 2014-09-17 DIAGNOSIS — I2102 ST elevation (STEMI) myocardial infarction involving left anterior descending coronary artery: Secondary | ICD-10-CM

## 2014-09-17 DIAGNOSIS — I213 ST elevation (STEMI) myocardial infarction of unspecified site: Secondary | ICD-10-CM

## 2014-09-17 DIAGNOSIS — F1490 Cocaine use, unspecified, uncomplicated: Secondary | ICD-10-CM

## 2014-09-17 DIAGNOSIS — Z720 Tobacco use: Secondary | ICD-10-CM

## 2014-09-17 DIAGNOSIS — I2109 ST elevation (STEMI) myocardial infarction involving other coronary artery of anterior wall: Secondary | ICD-10-CM

## 2014-09-17 DIAGNOSIS — F1410 Cocaine abuse, uncomplicated: Secondary | ICD-10-CM

## 2014-09-17 DIAGNOSIS — I219 Acute myocardial infarction, unspecified: Secondary | ICD-10-CM

## 2014-09-17 MED ORDER — TICAGRELOR 90 MG PO TABS: 90.0000 mg | ORAL_TABLET | Freq: Two times a day (BID) | ORAL | Status: DC

## 2014-09-17 MED ORDER — LISINOPRIL 10 MG PO TABS: 10.0000 mg | ORAL_TABLET | Freq: Every day | ORAL | Status: DC

## 2014-09-17 NOTE — Patient Instructions (Addendum)
Your physician wants you to have a Echo and  follow-up office appointment in: January 2016 You will receive a reminder letter in the mail two months in advance. If you don't receive a letter, please call our office to schedule the follow-up appointment.  Your physician has recommended you make the following change in your medication: THE LISINOPRIL HAS BEEN INCREASED ROM 5 MG TO 10 MG DAILY . This has already been sent to your [place].

## 2014-09-17 NOTE — Telephone Encounter (Signed)
Left message Brilinta samples will be left at the front desk for him to pick up.

## 2014-09-17 NOTE — Progress Notes (Signed)
Patient ID: Tyler Wong, male   DOB: June 08, 1966, 48 y.o.   MRN: [id number]     HPI: Tyler Wong is a 48 y.o. male who presents to the office today for a 2 month follow up cardiology evaluation following his ST segment elevation anterior wall myocardial infarction.  [name] is a 48 year old, African American male, who denied any significant prior cardiac history.  He presented to: Hospital in the middle of the night of 07/17/2014 with an ST segment elevation anterior wall myocardial infarction.  The patient had admitted to cocaine use.  The day before.  I performed emergent cardiac catheterization and he was found to have total proximal to mid LAD occlusion.  In a normal ramus intermediate, a dominant left circumflex, and a nondominant Right Coronary Artery.  There is mild to moderate acute left ventricular dysfunction involving the mid distal anterolateral wall and apex as well as distal inferoapical segment with initial acute ejection fraction of 45%.  He underwent successful DES stenting of his LAD and a resolute integrity 3.5x34 mm stent was inserted, postdilated to 3.9 mm, tapering to 3.7, 8 mm.  Subsequently, he was started on low-dose ACE inhibitor in addition to beta blocker therapy and statin therapy.  He saw Ellen Henri for initial evaluation, and was doing well.  He presents now for followup evaluation with me.  He denies any further cocaine use.  He denies any recurrent episodes of chest pain.  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  Past Medical History  Diagnosis Date  . CAD in native artery 07/17/14    STEMI- LAD stenosis with DES  . ST elevation myocardial infarction (STEMI) involving left anterior descending (LAD) coronary artery with complication   . Cocaine abuse   . Tobacco abuse 07/17/2014    Past Surgical History  Procedure Laterality Date  . Coronary angioplasty with stent placement  07/18/14    resolute DES to LAD STEMI    No Known Allergies  Current  Outpatient Prescriptions  Medication Sig Dispense Refill  . aspirin 81 MG tablet Take 1 tablet (81 mg total) by mouth daily.      Marland Kitchen atorvastatin (LIPITOR) 80 MG tablet Take 1 tablet (80 mg total) by mouth daily.  30 tablet  11  . carvedilol (COREG) 12.5 MG tablet Take 1 tablet (12.5 mg total) by mouth 2 (two) times daily with a meal.  60 tablet  11  . nitroGLYCERIN (NITROSTAT) 0.4 MG SL tablet Place 1 tablet (0.4 mg total) under the tongue every 5 (five) minutes as needed for chest pain.  25 tablet  3  . ticagrelor (BRILINTA) 90 MG TABS tablet Take 1 tablet (90 mg total) by mouth 2 (two) times daily.  48 tablet  0  . lisinopril (PRINIVIL,ZESTRIL) 10 MG tablet Take 1 tablet (10 mg total) by mouth daily.  90 tablet  3   No current facility-administered medications for this visit.    History   Social History  . Marital Status: Single    Spouse Name: N/A    Number of Children: N/A  . Years of Education: N/A   Occupational History  . Landscaping    Social History Main Topics  . Smoking status: Former Smoker    Quit date: 07/17/2014  . Smokeless tobacco: Never Used  . Alcohol Use: No     Comment: Patient denies abuse, states social drinker  . Drug Use: No  . Sexual Activity: Not on file   Other Topics Concern  .  Not on file   Social History Narrative  . No narrative on file    Family History  Problem Relation Age of Onset  . Heart attack Mother     ROS General: Negative; No fevers, chills, or night sweats HEENT: Negative; No changes in vision or hearing, sinus congestion, difficulty swallowing Pulmonary: Negative; No cough, wheezing, shortness of breath, hemoptysis Cardiovascular: See HPI: No chest pain, presyncope, syncope, palpatations GI: Negative; No nausea, vomiting, diarrhea, or abdominal pain GU: Negative; No dysuria, hematuria, or difficulty voiding Musculoskeletal: Negative; no myalgias, joint pain, or weakness Hematologic: Negative; no easy bruising,  bleeding Endocrine: Negative; no heat/cold intolerance; no diabetes, Neuro: Negative; no changes in balance, headaches Skin: Negative; No rashes or skin lesions Psychiatric: Negative; No behavioral problems, depression Sleep: Negative; No snoring,  daytime sleepiness, hypersomnolence, bruxism, restless legs, hypnogognic hallucinations. Other comprehensive 14 point system review is negative   Physical Exam BP 134/97  Pulse 84  Ht 5' 10.5" (1.791 m)  Wt 169 lb 6.4 oz (76.839 kg)  BMI 23.95 kg/m2 General: Alert, oriented, no distress.  Skin: normal turgor, no rashes, warm and dry HEENT: Normocephalic, atraumatic. Pupils equal round and reactive to light; sclera anicteric; extraocular muscles intact, No lid lag; Nose without nasal septal hypertrophy; Mouth/Parynx benign; Mallinpatti scale 2 Neck: No JVD, no carotid bruits; normal carotid upstroke Lungs: clear to ausculatation and percussion bilaterally; no wheezing or rales, normal inspiratory and expiratory effort Chest wall: without tenderness to palpitation Heart: PMI not displaced, RRR, s1 s2 normal, faint 1 over systolic murmur, No diastolic murmur, no rubs, gallops, thrills, or heaves Abdomen: soft, nontender; no hepatosplenomehaly, BS+; abdominal aorta nontender and not dilated by palpation. Back: no CVA tenderness Pulses: 2+ ; right groin catheterization site.  Stable  Musculoskeletal: full range of motion, normal strength, no joint deformities Extremities: Pulses 2+, no clubbing cyanosis or edema, Homan's sign negative  Neurologic: grossly nonfocal; Cranial nerves grossly wnl Psychologic: Normal mood and affect   ECG (independently read by me): Normal sinus rhythm.  Q waves V1 through V4 with T wave inversion across the precordium, concordant with his recent anterior wall myocardial infarction.  LABS:  BMET    Component Value Date/Time   NA 139 07/19/2014 0221   K 4.2 07/19/2014 0221   CL 101 07/19/2014 0221   CO2 28  07/19/2014 0221   GLUCOSE 120* 07/19/2014 0221   BUN 13 07/19/2014 0221   CREATININE 0.97 07/19/2014 0221   CALCIUM 9.0 07/19/2014 0221   GFRNONAA >90 07/19/2014 0221   GFRAA >90 07/19/2014 0221     Hepatic Function Panel     Component Value Date/Time   PROT 6.8 07/18/2014 0255   ALBUMIN 3.6 07/18/2014 0255   AST 72* 07/18/2014 0255   ALT 20 07/18/2014 0255   ALKPHOS 88 07/18/2014 0255   BILITOT 0.4 07/18/2014 0255     CBC    Component Value Date/Time   WBC 12.3* 07/19/2014 0221   RBC 5.11 07/19/2014 0221   HGB 15.3 07/19/2014 0221   HCT 45.7 07/19/2014 0221   PLT 236 07/19/2014 0221   MCV 89.4 07/19/2014 0221   MCH 29.9 07/19/2014 0221   MCHC 33.5 07/19/2014 0221   RDW 15.1 07/19/2014 0221     BNP    Component Value Date/Time   PROBNP 441.2* 07/18/2014 0255    Lipid Panel     Component Value Date/Time   CHOL 209* 07/18/2014 0255   TRIG 77 07/18/2014 0255   HDL 85 07/18/2014  0255   CHOLHDL 2.5 07/18/2014 0255   VLDL 15 07/18/2014 0255   LDLCALC 109* 07/18/2014 0255     RADIOLOGY: No results found.    ASSESSMENT AND PLAN: Mr. Keishon Chavarin is a 48 year old gentleman, who is now 2 months or sustaining an anterior wall ST segment elevation myocardial infarction undoubtedly contributed by cocaine use.  He underwent successful stenting of a totally occluded LAD with a DES stent.  His initial ejection fraction was approximately 45%.  His blood pressure today was 134/97 when taken by the nurse was 130/88 when taken by me.  I'm recommending further titration of his lisinopril from 5 mg to 10 mg.  I discussed the absolute importance of continuing to antiplatelet therapy for a minimum of a year.  He is tolerating aspirin and Brilinta without side effects.  His heart rate is controlled on his current dose of carvedilol 12.5 mg.  In January 2016, I am scheduling him for a six-month followup echo Doppler study following his event to see if there has been some improvement in LV function and wall  motion.  Again, I discussed the absolute importance of continued smoking cessation, as well as avoidance of cocaine.    [name], MD, [facility]  09/17/2014 3:37 PM

## 2014-09-17 NOTE — Telephone Encounter (Signed)
Informed patient's EC Brilinta samples left a front desk for pick-up.

## 2014-09-18 ENCOUNTER — Telehealth (HOSPITAL_COMMUNITY): Payer: Self-pay | Admitting: *Deleted

## 2014-09-19 ENCOUNTER — Telehealth (HOSPITAL_COMMUNITY): Payer: Self-pay | Admitting: *Deleted

## 2014-09-26 ENCOUNTER — Telehealth (HOSPITAL_COMMUNITY): Payer: Self-pay | Admitting: *Deleted

## 2014-10-02 ENCOUNTER — Telehealth (HOSPITAL_COMMUNITY): Payer: Self-pay | Admitting: *Deleted

## 2014-12-06 ENCOUNTER — Encounter (HOSPITAL_COMMUNITY): Payer: Self-pay | Admitting: Cardiovascular Disease

## 2016-04-13 ENCOUNTER — Encounter (HOSPITAL_COMMUNITY): Payer: Self-pay | Admitting: Emergency Medicine

## 2016-04-13 ENCOUNTER — Emergency Department (HOSPITAL_COMMUNITY)
Admission: EM | Admit: 2016-04-13 | Discharge: 2016-04-13 | Disposition: A | Discharge status: Home / Self Care | Payer: Self-pay | Attending: Emergency Medicine | Admitting: Emergency Medicine

## 2016-04-13 ENCOUNTER — Emergency Department (HOSPITAL_COMMUNITY): Payer: Self-pay

## 2016-04-13 DIAGNOSIS — R7989 Other specified abnormal findings of blood chemistry: Secondary | ICD-10-CM | POA: Insufficient documentation

## 2016-04-13 DIAGNOSIS — M546 Pain in thoracic spine: Secondary | ICD-10-CM | POA: Insufficient documentation

## 2016-04-13 DIAGNOSIS — I252 Old myocardial infarction: Secondary | ICD-10-CM | POA: Insufficient documentation

## 2016-04-13 DIAGNOSIS — Z9889 Other specified postprocedural states: Secondary | ICD-10-CM | POA: Insufficient documentation

## 2016-04-13 DIAGNOSIS — R778 Other specified abnormalities of plasma proteins: Secondary | ICD-10-CM

## 2016-04-13 DIAGNOSIS — T507X1A Poisoning by analeptics and opioid receptor antagonists, accidental (unintentional), initial encounter: Secondary | ICD-10-CM | POA: Insufficient documentation

## 2016-04-13 DIAGNOSIS — F1110 Opioid abuse, uncomplicated: Secondary | ICD-10-CM | POA: Insufficient documentation

## 2016-04-13 DIAGNOSIS — Z87891 Personal history of nicotine dependence: Secondary | ICD-10-CM | POA: Insufficient documentation

## 2016-04-13 DIAGNOSIS — F1490 Cocaine use, unspecified, uncomplicated: Secondary | ICD-10-CM | POA: Diagnosis present

## 2016-04-13 DIAGNOSIS — I2510 Atherosclerotic heart disease of native coronary artery without angina pectoris: Secondary | ICD-10-CM | POA: Insufficient documentation

## 2016-04-13 DIAGNOSIS — R0789 Other chest pain: Secondary | ICD-10-CM | POA: Insufficient documentation

## 2016-04-13 DIAGNOSIS — T401X1A Poisoning by heroin, accidental (unintentional), initial encounter: Secondary | ICD-10-CM

## 2016-04-13 DIAGNOSIS — I5042 Chronic combined systolic (congestive) and diastolic (congestive) heart failure: Secondary | ICD-10-CM

## 2016-04-13 DIAGNOSIS — Y9289 Other specified places as the place of occurrence of the external cause: Secondary | ICD-10-CM | POA: Insufficient documentation

## 2016-04-13 DIAGNOSIS — Z720 Tobacco use: Secondary | ICD-10-CM | POA: Diagnosis present

## 2016-04-13 DIAGNOSIS — Y9389 Activity, other specified: Secondary | ICD-10-CM | POA: Insufficient documentation

## 2016-04-13 DIAGNOSIS — F1410 Cocaine abuse, uncomplicated: Secondary | ICD-10-CM | POA: Insufficient documentation

## 2016-04-13 DIAGNOSIS — F1910 Other psychoactive substance abuse, uncomplicated: Secondary | ICD-10-CM

## 2016-04-13 DIAGNOSIS — Y998 Other external cause status: Secondary | ICD-10-CM | POA: Insufficient documentation

## 2016-04-13 LAB — I-STAT VENOUS BLOOD GAS, ED
Acid-Base Excess: 6 mmol/L — ABNORMAL HIGH (ref 0.0–2.0)
Bicarbonate: 32.2 mEq/L — ABNORMAL HIGH (ref 20.0–24.0)
O2 Saturation: 76 %
TCO2: 34 mmol/L (ref 0–100)
pCO2, Ven: 49.1 mmHg (ref 45.0–50.0)
pH, Ven: 7.426 — ABNORMAL HIGH (ref 7.250–7.300)
pO2, Ven: 41 mmHg (ref 31.0–45.0)

## 2016-04-13 LAB — TROPONIN I
Troponin I: 0.04 ng/mL — ABNORMAL HIGH (ref <0.031)
Troponin I: 0.03 ng/mL (ref <0.031)

## 2016-04-13 LAB — CBC WITH DIFFERENTIAL/PLATELET
Basophils Absolute: 0 10*3/uL (ref 0.0–0.1)
Basophils Relative: 0 %
Eosinophils Absolute: 0.1 10*3/uL (ref 0.0–0.7)
Eosinophils Relative: 1 %
HCT: 44.7 % (ref 39.0–52.0)
Hemoglobin: 14.6 g/dL (ref 13.0–17.0)
Lymphocytes Relative: 11 %
Lymphs Abs: 0.9 10*3/uL (ref 0.7–4.0)
MCH: 29.1 pg (ref 26.0–34.0)
MCHC: 32.7 g/dL (ref 30.0–36.0)
MCV: 89 fL (ref 78.0–100.0)
Monocytes Absolute: 0.5 10*3/uL (ref 0.1–1.0)
Monocytes Relative: 6 %
Neutro Abs: 6.7 10*3/uL (ref 1.7–7.7)
Neutrophils Relative %: 82 %
Platelets: 263 10*3/uL (ref 150–400)
RBC: 5.02 MIL/uL (ref 4.22–5.81)
RDW: 13.9 % (ref 11.5–15.5)
WBC: 8.2 10*3/uL (ref 4.0–10.5)

## 2016-04-13 LAB — COMPREHENSIVE METABOLIC PANEL
ALT: 57 U/L (ref 17–63)
AST: 80 U/L — ABNORMAL HIGH (ref 15–41)
Albumin: 3.2 g/dL — ABNORMAL LOW (ref 3.5–5.0)
Alkaline Phosphatase: 96 U/L (ref 38–126)
Anion gap: 9 (ref 5–15)
BUN: 19 mg/dL (ref 6–20)
CO2: 26 mmol/L (ref 22–32)
Calcium: 8.8 mg/dL — ABNORMAL LOW (ref 8.9–10.3)
Chloride: 104 mmol/L (ref 101–111)
Creatinine, Ser: 1.14 mg/dL (ref 0.61–1.24)
GFR calc Af Amer: >60 mL/min (ref >60)
GFR calc non Af Amer: >60 mL/min (ref >60)
Glucose, Bld: 142 mg/dL — ABNORMAL HIGH (ref 65–99)
Potassium: 4.7 mmol/L (ref 3.5–5.1)
Sodium: 139 mmol/L (ref 135–145)
Total Bilirubin: 1.1 mg/dL (ref 0.3–1.2)
Total Protein: 6.7 g/dL (ref 6.5–8.1)

## 2016-04-13 LAB — RAPID URINE DRUG SCREEN, HOSP PERFORMED
Amphetamines: NOT DETECTED
Barbiturates: NOT DETECTED
Benzodiazepines: NOT DETECTED
Cocaine: POSITIVE — AB
Opiates: POSITIVE — AB
Tetrahydrocannabinol: NOT DETECTED

## 2016-04-13 LAB — ACETAMINOPHEN LEVEL: Acetaminophen (Tylenol), Serum: <10 ug/mL — ABNORMAL LOW (ref 10–30)

## 2016-04-13 LAB — I-STAT TROPONIN, ED
Troponin i, poc: 0 ng/mL (ref 0.00–0.08)
Troponin i, poc: 0.02 ng/mL (ref 0.00–0.08)

## 2016-04-13 LAB — ETHANOL: Alcohol, Ethyl (B): <5 mg/dL (ref <5)

## 2016-04-13 LAB — SALICYLATE LEVEL: Salicylate Lvl: <4 mg/dL (ref 2.8–30.0)

## 2016-04-13 LAB — LIPASE, BLOOD: Lipase: 19 U/L (ref 11–51)

## 2016-04-13 MED ORDER — LISINOPRIL 10 MG PO TABS: 10.0000 mg | ORAL_TABLET | Freq: Every day | ORAL | Status: DC

## 2016-04-13 MED ORDER — SODIUM CHLORIDE 0.9 % IV BOLUS (SEPSIS)
1000.0000 mL | Freq: Once | INTRAVENOUS | Status: AC
  Administered 2016-04-13: 1000 mL via INTRAVENOUS

## 2016-04-13 MED ORDER — TICAGRELOR 90 MG PO TABS: 90.0000 mg | ORAL_TABLET | Freq: Two times a day (BID) | ORAL | Status: DC

## 2016-04-13 MED ORDER — ATORVASTATIN CALCIUM 80 MG PO TABS: 80.0000 mg | ORAL_TABLET | Freq: Every day | ORAL | Status: DC

## 2016-04-13 MED ORDER — ASPIRIN 81 MG PO TABS: 81.0000 mg | ORAL_TABLET | Freq: Every day | ORAL | Status: DC

## 2016-04-13 MED ORDER — GI COCKTAIL ~~LOC~~
30.0000 mL | Freq: Once | ORAL | Status: DC
  Filled 2016-04-13: qty 30
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
## 2016-04-13 NOTE — ED Provider Notes (Signed)
[name]     Arrival date & time 04/13/16  1432 History   None    Chief Complaint  Patient presents with  . Abdominal Pain  . Drug Overdose     (Consider location/radiation/quality/duration/timing/severity/associated sxs/prior Treatment) HPI   50 year old male with extensive previous cardiac history including ST elevation myocardial infarction of the LAD with drug-eluting stent in 2015 and history of polysubstance abuse presents with chest pain, nausea, and shortness of breath after receiving Narcan. The patient states he was using heroin and possibly cocaine for the last several days. He believes that he accidentally overdosed today. The first thing he remembers is EMS arriving and giving him Narcan. After receiving Narcan, the patient had acute onset of epigastric pain and nausea. He has had belching as well. He states the pain feels somewhat similar to his previous myocardial infarction but the nausea is more predominant at this time. Denies any cocaine use today but also states he is unable to remember the last 24 hours.  Past Medical History  Diagnosis Date  . CAD in native artery 07/17/14    STEMI- LAD stenosis with DES  . ST elevation myocardial infarction (STEMI) involving left anterior descending (LAD) coronary artery with complication ([place])   . Cocaine abuse   . Tobacco abuse 07/17/2014   Past Surgical History  Procedure Laterality Date  . Coronary angioplasty with stent placement  07/18/14    resolute DES to LAD STEMI  . Left heart cath N/A 07/17/2014    Procedure: LEFT HEART CATH;  Surgeon: [name], MD;  Location: [facility] CATH LAB;  Service: Cardiovascular;  Laterality: N/A;  . Percutaneous coronary stent intervention (pci-s)  07/17/2014    Procedure: PERCUTANEOUS CORONARY STENT INTERVENTION (PCI-S);  Surgeon: [name], MD;  Location: [facility] CATH LAB;  Service: Cardiovascular;;   Family History  Problem Relation Age of Onset  . Heart attack Mother    Social  History  Substance Use Topics  . Smoking status: Former Smoker -- 1.00 packs/day    Types: Cigarettes    Quit date: 07/17/2014  . Smokeless tobacco: Never Used  . Alcohol Use: No     Comment: Patient denies abuse, states social drinker    Review of Systems  Constitutional: Positive for fatigue. Negative for fever and chills.  HENT: Negative for congestion.   Eyes: Negative for visual disturbance.  Respiratory: Negative for cough and shortness of breath.   Cardiovascular: Positive for chest pain. Negative for leg swelling.  Gastrointestinal: Positive for nausea. Negative for vomiting, abdominal pain and diarrhea.  Genitourinary: Negative for flank pain.  Musculoskeletal: Negative for neck pain and neck stiffness.  Skin: Negative for rash.  Neurological: Negative for syncope, weakness and headaches.      Allergies  Review of patient's allergies indicates no known allergies.  Home Medications   Prior to Admission medications   Medication Sig Start Date End Date Taking? Authorizing Provider  nitroGLYCERIN (NITROSTAT) 0.4 MG SL tablet Place 1 tablet (0.4 mg total) under the tongue every 5 (five) minutes as needed for chest pain. 07/20/14  Yes Rhonda G Barrett, PA-C  aspirin 81 MG tablet Take 1 tablet (81 mg total) by mouth daily. 04/13/16   Duffy Bruce, MD  atorvastatin (LIPITOR) 80 MG tablet Take 1 tablet (80 mg total) by mouth daily. 04/13/16   Duffy Bruce, MD  lisinopril (PRINIVIL,ZESTRIL) 10 MG tablet Take 1 tablet (10 mg total) by mouth daily. 04/13/16   Duffy Bruce, MD  ticagrelor [facility]) 90  MG TABS tablet Take 1 tablet (90 mg total) by mouth 2 (two) times daily. 04/13/16   Duffy Bruce, MD   BP 117/87 mmHg  Pulse 76  Temp(Src) 97.7 F (36.5 C) (Oral)  Resp 13  SpO2 96% Physical Exam  Constitutional: He is oriented to person, place, and time. He appears well-developed and well-nourished. No distress.  HENT:  Head: Normocephalic.  Mouth/Throat: No  oropharyngeal exudate.  Eyes: Conjunctivae are normal. Pupils are equal, round, and reactive to light.  Neck: Normal range of motion. Neck supple.  Cardiovascular: Normal rate, regular rhythm, normal heart sounds and intact distal pulses.  Exam reveals no friction rub.   No murmur heard. Pulmonary/Chest: Effort normal and breath sounds normal. No respiratory distress. He has no wheezes. He has no rales. He exhibits tenderness (moderate chest wall TTP across anterior chest).  Abdominal: Soft. Bowel sounds are normal. He exhibits no distension. There is no tenderness.  Musculoskeletal: He exhibits no edema.  Neurological: He is alert and oriented to person, place, and time.  Skin: Skin is warm.  Nursing note and vitals reviewed.   ED Course  Procedures (including critical care time) Labs Review Labs Reviewed  COMPREHENSIVE METABOLIC PANEL - Abnormal; Notable for the following:    Glucose, Bld 142 (*)    Calcium 8.8 (*)    Albumin 3.2 (*)    AST 80 (*)    All other components within normal limits  ACETAMINOPHEN LEVEL - Abnormal; Notable for the following:    Acetaminophen (Tylenol), Serum <10 (*)    All other components within normal limits  URINE RAPID DRUG SCREEN, HOSP PERFORMED - Abnormal; Notable for the following:    Opiates POSITIVE (*)    Cocaine POSITIVE (*)    All other components within normal limits  TROPONIN I - Abnormal; Notable for the following:    Troponin I 0.04 (*)    All other components within normal limits  I-STAT VENOUS BLOOD GAS, ED - Abnormal; Notable for the following:    pH, Ven 7.426 (*)    Bicarbonate 32.2 (*)    Acid-Base Excess 6.0 (*)    All other components within normal limits  CBC WITH DIFFERENTIAL/PLATELET  LIPASE, BLOOD  SALICYLATE LEVEL  ETHANOL  TROPONIN I  I-STAT TROPOININ, ED  Randolm Idol, ED    Imaging Review Dg Chest Portable 1 View  04/13/2016  CLINICAL DATA:  Found unresponsive with suppressed respirations. Drug use. EXAM:  PORTABLE CHEST 1 VIEW COMPARISON:  07/17/2014 FINDINGS: The heart is enlarged. Mediastinal shadows are normal. The patient has taken a poor inspiration. Allowing for that, the lungs are probably clear. Vascularity is normal. No effusions. No bony abnormalities. IMPRESSION: Poor inspiration.  No active disease suspected. Electronically Signed   By: Nelson Chimes M.D.   On: 04/13/2016 15:48   I have personally reviewed and evaluated these images and lab results as part of my medical decision-making.   EKG Interpretation   Date/Time:  Monday April 13 2016 23:18:21 EDT Ventricular Rate:  89 PR Interval:  150 QRS Duration: 74 QT Interval:  371 QTC Calculation: 451 R Axis:   -27 Text Interpretation:  Sinus rhythm Borderline left axis deviation Probable  anterior infarct, age indeterminate Interpretation limited secondary to  artifact Confirmed by BEATON  MD, ROBERT [id number]) on 04/14/2016 10:38:44 AM      MDM   50 yo M with PMHx of CAD s/p PCI to LAD, h/o polysubstance abuse who presents with accidental overdose on heroin, now  alert after narcan by EMS. Pt briefly received CPR prior to EMS arrival but no loss of pulses per EMS. On arrival, VSS and WNL. Exam is as above. EKG shows likely LVH but with ST changes in V2, V3. I suspect this is 2/2 old STEMI based on prior EKGs but called and discussed with Dr. Ellyn Hack, Cardiology STEMI attending, immediately on pt arrival. Per Dr. Ellyn Hack, likely resolving changes of prior MI - no indication for STEMI activation at this time. Suspect CP is 2/2 chest wall pain from brief CPR. CXR shows no PTX, rib fx, or PNA. Pt o/w well appearing. Suspect nausea is 2/2 narcan use in setting of heroin OD. No abdominal TTP. Will check labs, monitor in ED.  Labs reviewed. CBC shows no leukocytosis or anemia. CMP with AST elevation, likely 2/2 EtOH use. Trop 0.04. Consulted cardiology. Pt is CP free now and EKGs remain unchanged. ASA has already been given.   Cardiology has  evaluated. They do not feel ACS likely and suspect mild trop elevation 2/2 cocaine abuse, overdose. They recommend d/c home if second trop is unchanged/downtrending. Pt remains HDS.  Delta trop is now downtrending and negative at 0.03. Pt remains CP free. He is alert and oriented. Will d/c home with refill of home meds, d/c of coreg given cocaine abuse (as per Cards recommendations). Discussed substance abuse with pt, as well as importance of taking his medications. PCP referral made. Will d/c home.  Clinical Impression: 1. Polysubstance abuse   2. Heroin overdose, accidental or unintentional, initial encounter   3. Cocaine abuse   4. Elevated troponin     Disposition: Discharge  Condition: Good  I have discussed the results, Dx and Tx plan with the pt(& family if present). He/she/they expressed understanding and agree(s) with the plan. Discharge instructions discussed at great length. Strict return precautions discussed and pt &/or family have verbalized understanding of the instructions. No further questions at time of discharge.    Discharge Medication List as of 04/13/2016 11:14 PM      Follow Up: [place] [address] [place] [phone]  Follow-up with your doctor in 2-3 days. It is very important that you start taking her medications as prescribed to prevent a heart attack.   Pt seen in conjunction with Dr. Hassan Buckler, MD 04/14/16 1415  Duffy Bruce, MD 04/14/16 [place], MD 04/15/16 1334

## 2016-04-13 NOTE — ED Notes (Signed)
Troponin recollected; pt given sandwich and Coke

## 2016-04-13 NOTE — Discharge Instructions (Signed)
Accidental Overdose °A drug overdose occurs when a chemical substance (drug or medication) is used in amounts large enough to overcome a person. This may result in severe illness or death. This is a type of poisoning. Accidental overdoses of medications or other substances come from a variety of reasons. When this happens accidentally, it is often because the person taking the substance does not know enough about what they have taken. Drugs which commonly cause overdose deaths are alcohol, psychotropic medications (medications which affect the mind), pain medications, illegal drugs (street drugs) such as cocaine and heroin, and multiple drugs taken at the same time. It may result from careless behavior (such as over-indulging at a party). Other causes of overdose may include multiple drug use, a lapse in memory, or drug use after a period of no drug use.  °Sometimes overdosing occurs because a person cannot remember if they have taken their medication.  °A common unintentional overdose in young children involves multi-vitamins containing iron. Iron is a part of the hemoglobin molecule in blood. It is used to transport oxygen to living cells. When taken in small amounts, iron allows the body to restock hemoglobin. In large amounts, it causes problems in the body. If this overdose is not treated, it can lead to death. °Never take medicines that show signs of tampering or do not seem quite right. Never take medicines in the dark or in poor lighting. Read the label and check each dose of medicine before you take it. When adults are poisoned, it happens most often through carelessness or lack of information. Taking medicines in the dark or taking medicine prescribed for someone else to treat the same type of problem is a dangerous practice. °SYMPTOMS  °Symptoms of overdose depend on the medication and amount taken. They can vary from over-activity with stimulant over-dosage, to sleepiness from depressants such as  alcohol, narcotics and tranquilizers. Confusion, dizziness, nausea and vomiting may be present. If problems are severe enough coma and death may result. °DIAGNOSIS  °Diagnosis and management are generally straightforward if the drug is known. Otherwise it is more difficult. At times, certain symptoms and signs exhibited by the patient, or blood tests, can reveal the drug in question.  °TREATMENT  °In an emergency department, most patients can be treated with supportive measures. Antidotes may be available if there has been an overdose of opioids or benzodiazepines. A rapid improvement will often occur if this is the cause of overdose. °At home or away from medical care: °· There may be no immediate problems or warning signs in children. °· Not everything works well in all cases of poisoning. °· Take immediate action. Poisons may act quickly. °· If you think someone has swallowed medicine or a household product, and the person is unconscious, having seizures (convulsions), or is not breathing, immediately call for an ambulance. °IF a person is conscious and appears to be doing OK but has swallowed a poison: °· Do not wait to see what effect the poison will have. Immediately call a poison control center (listed in the white pages of your telephone book under "Poison Control" or inside the front cover with other emergency numbers). Some poison control centers have TTY capability for the deaf. Check with your local center if you or someone in your family requires this service. °· Keep the container so you can read the label on the product for ingredients. °· Describe what, when, and how much was taken and the age and condition of the person poisoned.   Inform them if the person is vomiting, choking, drowsy, shows a change in color or temperature of skin, is conscious or unconscious, or is convulsing. °· Do not cause vomiting unless instructed by medical personnel. Do not induce vomiting or force liquids into a person who  is convulsing, unconscious, or very drowsy. °Stay calm and in control.  °· Activated charcoal also is sometimes used in certain types of poisoning and you may wish to add a supply to your emergency medicines. It is available without a prescription. Call a poison control center before using this medication. °PREVENTION  °Thousands of children die every year from unintentional poisoning. This may be from household chemicals, poisoning from carbon monoxide in a car, taking their parent's medications, or simply taking a few iron pills or vitamins with iron. Poisoning comes from unexpected sources. °· Store medicines out of the sight and reach of children, preferably in a locked cabinet. Do not keep medications in a food cabinet. Always store your medicines in a secure place. Get rid of expired medications. °· If you have children living with you or have them as occasional guests, you should have child-resistant caps on your medicine containers. Keep everything out of reach. Child proof your home. °· If you are called to the telephone or to answer the door while you are taking a medicine, take the container with you or put the medicine out of the reach of small children. °· Do not take your medication in front of children. Do not tell your child how good a medication is and how good it is for them. They may get the idea it is more of a treat. °· If you are an adult and have accidentally taken an overdose, you need to consider how this happened and what can be done to prevent it from happening again. If this was from a street drug or alcohol, determine if there is a problem that needs addressing. If you are not sure a problems exists, it is easy to talk to a professional and ask them if they think you have a problem. It is better to handle this problem in this way before it happens again and has a much worse consequence. °  °This information is not intended to replace advice given to you by your health care provider. Make  sure you discuss any questions you have with your health care provider. °  °Document Released: 02/27/2005 Document Revised: 01/04/2015 Document Reviewed: 06/03/2015 °Elsevier Interactive Patient Education ©2016 [employer] ° °Narcotic Overdose °A narcotic overdose is the misuse or overuse of a narcotic drug. A narcotic overdose can make you pass out and stop breathing. If you are not treated right away, this can cause permanent brain damage or stop your heart. Medicine may be given to reverse the effects of an overdose. If so, this medicine may bring on withdrawal symptoms. The symptoms may be abdominal cramps, throwing up (vomiting), sweating, chills, and nervousness. °Injecting narcotics can cause more problems than just an overdose. AIDS, hepatitis, and other very serious infections are transmitted by sharing needles and syringes. If you decide to quit using, there are medicines which can help you through the withdrawal period. Trying to quit all at once on your own can be uncomfortable, but not life-threatening. Call your caregiver, Narcotics Anonymous, or any drug and alcohol treatment program for further help.  °  °This information is not intended to replace advice given to you by your health care provider. Make sure you discuss any questions   you have with your health care provider. °  °Document Released: 01/21/2005 Document Revised: 01/04/2015 Document Reviewed: 06/05/2015 °Elsevier Interactive Patient Education ©2016 [employer] ° °

## 2016-04-13 NOTE — ED Notes (Signed)
Cardiology at bedside.

## 2016-04-13 NOTE — ED Notes (Signed)
Pt found unresponsive and "barely breathing" and called ambulance by unknown person. Fire dept first to scene and gave narcan, with little change. EMS arrived and gave second dosage of narcan. Pt received total 6mg  Narcan. En route pt received 384 ASA but refused ntg. Pt admits to using herion but is unaware of the amount. EMS vitals BP 123/85, HR 94, CBG 244. Pt A x O x 4 upon arrival and complaining of abdominal pain rated at 5. Pt blelching and pacing flatulence.

## 2016-09-01 ENCOUNTER — Encounter (HOSPITAL_COMMUNITY): Payer: Self-pay

## 2016-09-01 ENCOUNTER — Emergency Department (HOSPITAL_COMMUNITY): Payer: Self-pay

## 2016-09-01 ENCOUNTER — Emergency Department (HOSPITAL_COMMUNITY)
Admission: EM | Admit: 2016-09-01 | Discharge: 2016-09-01 | Disposition: A | Discharge status: Home / Self Care | Payer: Self-pay | Attending: Emergency Medicine | Admitting: Emergency Medicine

## 2016-09-01 DIAGNOSIS — Y999 Unspecified external cause status: Secondary | ICD-10-CM | POA: Insufficient documentation

## 2016-09-01 DIAGNOSIS — W19XXXA Unspecified fall, initial encounter: Secondary | ICD-10-CM

## 2016-09-01 DIAGNOSIS — W14XXXA Fall from tree, initial encounter: Secondary | ICD-10-CM | POA: Insufficient documentation

## 2016-09-01 DIAGNOSIS — Y929 Unspecified place or not applicable: Secondary | ICD-10-CM | POA: Insufficient documentation

## 2016-09-01 DIAGNOSIS — Z955 Presence of coronary angioplasty implant and graft: Secondary | ICD-10-CM | POA: Insufficient documentation

## 2016-09-01 DIAGNOSIS — M25521 Pain in right elbow: Secondary | ICD-10-CM | POA: Insufficient documentation

## 2016-09-01 DIAGNOSIS — I2510 Atherosclerotic heart disease of native coronary artery without angina pectoris: Secondary | ICD-10-CM | POA: Insufficient documentation

## 2016-09-01 DIAGNOSIS — F17210 Nicotine dependence, cigarettes, uncomplicated: Secondary | ICD-10-CM | POA: Insufficient documentation

## 2016-09-01 DIAGNOSIS — G589 Mononeuropathy, unspecified: Secondary | ICD-10-CM | POA: Insufficient documentation

## 2016-09-01 DIAGNOSIS — I252 Old myocardial infarction: Secondary | ICD-10-CM | POA: Insufficient documentation

## 2016-09-01 DIAGNOSIS — Y939 Activity, unspecified: Secondary | ICD-10-CM | POA: Insufficient documentation

## 2016-09-01 MED ORDER — PREDNISONE 20 MG PO TABS: 60.0000 mg | ORAL_TABLET | Freq: Every day | ORAL | 0 refills | Status: DC

## 2016-09-01 NOTE — ED Triage Notes (Signed)
Pt states while at work today he fell out of a tree, appx about 42ft. C/o R elbow pain, numbness in fingers, and forearm, swelling noted to forearm and wrist.

## 2016-09-01 NOTE — ED Provider Notes (Signed)
[facility] DEPT Provider Note   CSN: [id number] Arrival date & time: 09/01/16  [id number]  By signing my name below, I, Reola Mosher, attest that this documentation has been prepared under the direction and in the presence of Everlene Balls, MD. Electronically Signed: Reola Mosher, ED Scribe. 09/01/16. 2:21 AM.  History   Chief Complaint Chief Complaint  Patient presents with  . Fall   The history is provided by the patient. No language interpreter was used.    HPI Comments: Vikas Leis is a 50 y.o. male who presents to the Emergency Department complaining of sudden onset, gradually improving right elbow pain s/p ~53ft fall that occurred this afternoon PTA. Pt reports that he was up in a tree, when he suddenly fell out, landing on his right side. Denies LOC or head injury. Pt notes associated right-sided neck pain, right shoulder numbness, and right 2nd-4th distal digit numbness secondary to his elbow pain. He states that since the incident that he can no longer bend his elbow, and with attempted movement his pain is mildly exacerbated. Pt has been ambulatory since the incident without difficulty. No treatments were tried prior to coming into the ED. His pain has not radiated into his hand or shoulder. Pt denies any other pain otherwise, weakness, or any other associated symptoms.    Past Medical History:  Diagnosis Date  . CAD in native artery 07/17/14   STEMI- LAD stenosis with DES  . Cocaine abuse   . ST elevation myocardial infarction (STEMI) involving left anterior descending (LAD) coronary artery with complication ([place])   . Tobacco abuse 07/17/2014   Patient Active Problem List   Diagnosis Date Noted  . ST elevation (STEMI) myocardial infarction involving left anterior descending coronary artery ([place]) 07/18/2014  . STEMI (ST elevation myocardial infarction) ([place]) 07/17/2014  . Tobacco abuse 07/17/2014  . Cocaine use 07/17/2014   Past Surgical History:  Procedure  Laterality Date  . CORONARY ANGIOPLASTY WITH STENT PLACEMENT  07/18/14   resolute DES to LAD STEMI  . LEFT HEART CATH N/A 07/17/2014   Procedure: LEFT HEART CATH;  Surgeon: [name], MD;  Location: [facility] CATH LAB;  Service: Cardiovascular;  Laterality: N/A;  . PERCUTANEOUS CORONARY STENT INTERVENTION (PCI-S)  07/17/2014   Procedure: PERCUTANEOUS CORONARY STENT INTERVENTION (PCI-S);  Surgeon: [name], MD;  Location: [facility] CATH LAB;  Service: Cardiovascular;;    Home Medications    Prior to Admission medications   Medication Sig Start Date End Date Taking? Authorizing Provider  nitroGLYCERIN (NITROSTAT) 0.4 MG SL tablet Place 1 tablet (0.4 mg total) under the tongue every 5 (five) minutes as needed for chest pain. 07/20/14  Yes Rhonda G Barrett, PA-C   Family History Family History  Problem Relation Age of Onset  . Heart attack Mother    Social History Social History  Substance Use Topics  . Smoking status: Former Smoker    Packs/day: 1.00    Types: Cigarettes    Quit date: 07/17/2014  . Smokeless tobacco: Never Used  . Alcohol use No     Comment: Patient denies abuse, states social drinker   Allergies   Review of patient's allergies indicates no known allergies.  Review of Systems Review of Systems A complete 10 system review of systems was obtained and all systems are negative except as noted in the HPI and PMH.   Physical Exam Updated Vital Signs BP 128/94   Pulse 86   Temp 97.9 F (36.6 C) (Oral)  Resp 20   Ht 5\' 11"  (1.803 m)   Wt 175 lb (79.4 kg)   SpO2 98%   BMI 24.41 kg/m   Physical Exam  Constitutional: He is oriented to person, place, and time. Vital signs are normal. He appears well-developed and well-nourished.  Non-toxic appearance. He does not appear ill. No distress.  HENT:  Head: Normocephalic and atraumatic.  Nose: Nose normal.  Mouth/Throat: Oropharynx is clear and moist. No oropharyngeal exudate.  Eyes: Conjunctivae and EOM are normal.  Pupils are equal, round, and reactive to light. No scleral icterus.  Neck: Normal range of motion. Neck supple. No tracheal deviation, no edema, no erythema and normal range of motion present. No thyroid mass and no thyromegaly present.  Cardiovascular: Normal rate, regular rhythm, S1 normal, S2 normal, normal heart sounds, intact distal pulses and normal pulses.  Exam reveals no gallop and no friction rub.   No murmur heard. Pulmonary/Chest: Effort normal and breath sounds normal. No respiratory distress. He has no wheezes. He has no rhonchi. He has no rales.  Abdominal: Soft. Normal appearance and bowel sounds are normal. He exhibits no distension, no ascites and no mass. There is no hepatosplenomegaly. There is no tenderness. There is no rebound, no guarding and no CVA tenderness.  Musculoskeletal: Normal range of motion. He exhibits no edema or tenderness.  Lymphadenopathy:    He has no cervical adenopathy.  Neurological: He is alert and oriented to person, place, and time. He has normal strength. No cranial nerve deficit or sensory deficit.  Decreased sensation of radial distrubution of the right arm. 0/5 strength at the bicep muscle.   Skin: Skin is warm, dry and intact. No petechiae and no rash noted. He is not diaphoretic. No erythema. No pallor.  Nursing note and vitals reviewed.  ED Treatments / Results  DIAGNOSTIC STUDIES: Oxygen Saturation is 99% on RA, normal by my interpretation.  COORDINATION OF CARE: 2:21 AM-Discussed next steps with pt. Pt verbalized understanding and is agreeable with the plan.   Labs (all labs ordered are listed, but only abnormal results are displayed) Labs Reviewed - No data to display  EKG  EKG Interpretation None      Radiology Dg Elbow Complete Right (3+view)  Result Date: 09/01/2016 CLINICAL DATA:  50 year old male with fall and trauma to the right upper extremity EXAM: RIGHT FOREARM - 2 VIEW; RIGHT WRIST - COMPLETE 3+ VIEW; RIGHT ELBOW -  COMPLETE 3+ VIEW COMPARISON:  None. FINDINGS: There is no acute fracture or dislocation. The bones are well mineralized. No arthritic changes. There is no joint effusion. The soft tissues appear unremarkable. IMPRESSION: Negative. Electronically Signed   By: Anner Crete M.D.   On: 09/01/2016 01:31   Dg Forearm Right  Result Date: 09/01/2016 CLINICAL DATA:  50 year old male with fall and trauma to the right upper extremity EXAM: RIGHT FOREARM - 2 VIEW; RIGHT WRIST - COMPLETE 3+ VIEW; RIGHT ELBOW - COMPLETE 3+ VIEW COMPARISON:  None. FINDINGS: There is no acute fracture or dislocation. The bones are well mineralized. No arthritic changes. There is no joint effusion. The soft tissues appear unremarkable. IMPRESSION: Negative. Electronically Signed   By: Anner Crete M.D.   On: 09/01/2016 01:31   Dg Wrist Complete Right  Result Date: 09/01/2016 CLINICAL DATA:  50 year old male with fall and trauma to the right upper extremity EXAM: RIGHT FOREARM - 2 VIEW; RIGHT WRIST - COMPLETE 3+ VIEW; RIGHT ELBOW - COMPLETE 3+ VIEW COMPARISON:  None. FINDINGS: There is no  acute fracture or dislocation. The bones are well mineralized. No arthritic changes. There is no joint effusion. The soft tissues appear unremarkable. IMPRESSION: Negative. Electronically Signed   By: Anner Crete M.D.   On: 09/01/2016 01:31   Mr Cervical Spine Wo Contrast  Result Date: 09/01/2016 CLINICAL DATA:  Initial evaluation for recent trauma, fall. Difficulty with moving right upper extremity. EXAM: MRI CERVICAL SPINE WITHOUT CONTRAST TECHNIQUE: Multiplanar, multisequence MR imaging of the cervical spine was performed. No intravenous contrast was administered. COMPARISON:  None. FINDINGS: Alignment: Study moderately degraded by motion artifact. Vertebral bodies normally aligned with preservation of the normal cervical lordosis. No listhesis. Vertebrae: Vertebral body heights preserved. Signal intensity within the vertebral body bone  marrow normal. No evidence for fracture on this exam. Cord: Signal intensity within the cervical spinal cord is grossly normal without acute injury. Isointense T1 signal intensity/T2 hyperintense signal intensity within the right and left ventral epidural space extending from the craniocervical junction inferiorly through the upper thoracic spine favored to reflect prominent epidural fat. No signal loss as might be expected with blood seen on gradient echo sequence. No definite epidural collection identified. Posterior Fossa, vertebral arteries, paraspinal tissues: Visualized portions of the brain and posterior fossa demonstrate no acute abnormality. Craniocervical junction normal. Paraspinous soft tissues demonstrate no acute abnormality. No paraspinous or prevertebral edema. No findings to suggest ligamentous injury. Normal intravascular flow voids present within the vertebral arteries bilaterally. Disc levels: C2-C3: Mild bilateral uncovertebral spurring. No significant stenosis. C3-C4: Diffuse degenerative disc bulge with bilateral uncovertebral spurring. Resultant mild right foraminal stenosis. No significant left foraminal narrowing. Central disc osteophyte indents the ventral thecal sac results in mild canal stenosis. C4-C5: Diffuse degenerative disc bulge with bilateral uncovertebral spurring. Mild facet arthrosis. Resultant mild left foraminal stenosis. Central disc osteophyte indents the ventral thecal sac results in mild canal stenosis. C5-C6: Diffuse disc bulge with mild bile uncovertebral spurring and facet arthrosis. No significant canal or foraminal stenosis. C6-C7: Central disc protrusion indents the ventral thecal sac resulting in mild canal stenosis. Mild bilateral uncovertebral spurring and facet arthrosis, slightly worse on the right. Resultant moderate right with mild left foraminal narrowing. No significant canal stenosis. C7-T1:  Negative. Visualized portions of the upper thoracic spine  demonstrate no acute abnormality. IMPRESSION: 1. Motion degraded study with no definite acute traumatic injury within the cervical spine. 2. 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 No significant canal stenosis identified. Please see above report for a full description of these findings. Electronically Signed   By: Jeannine Boga M.D.   On: 09/01/2016 05:59   Procedures Procedures (including critical care time)  Medications Ordered in ED Medications - No data to display  Initial Impression / Assessment and Plan / ED Course  I have reviewed the triage vital signs and the nursing notes.  Pertinent labs & imaging results that were available during my care of the patient were reviewed by me and considered in my medical decision making (see chart for details).  Clinical Course    Patient presents to the ED for decreased movement of his R arm after a fall from a tree.  Neurological exam is significant.  Likely brachial plexus injury and he has good strength distal to his elbow.  Will obtain MRI c spine to be sure this is not a cervical injury.     6:20 AM MRI neg for cord compression.  Patient advised to follow up with neurology for further care.  I spoke  with Dr. Nicole Kindred who agrees with outpatient referral to neurology.  He also states a short course of steroids may be helpful.  He appears well and in NAD. VS remain within his normal limits and he is safe for DC. Final Clinical Impressions(s) / ED Diagnoses   Final diagnoses:  Fall    New Prescriptions New Prescriptions   No medications on file    I personally performed the services described in this documentation, which was scribed in my presence. The recorded information has been reviewed and is accurate.      Everlene Balls, MD 09/01/16 [phone]

## 2016-10-13 ENCOUNTER — Ambulatory Visit: Payer: Self-pay | Admitting: Internal Medicine

## 2016-11-04 ENCOUNTER — Ambulatory Visit: Payer: Self-pay | Admitting: Neurology

## 2016-11-13 ENCOUNTER — Encounter: Payer: Self-pay | Admitting: Neurology

## 2016-12-30 ENCOUNTER — Emergency Department (HOSPITAL_COMMUNITY): Payer: Self-pay

## 2016-12-30 ENCOUNTER — Observation Stay (HOSPITAL_COMMUNITY)
Admission: EM | Admit: 2016-12-30 | Discharge: 2016-12-31 | Disposition: A | Discharge status: Home / Self Care | Payer: Self-pay | Attending: Internal Medicine | Admitting: Internal Medicine

## 2016-12-30 ENCOUNTER — Encounter (HOSPITAL_COMMUNITY): Payer: Self-pay | Admitting: Emergency Medicine

## 2016-12-30 DIAGNOSIS — I5042 Chronic combined systolic (congestive) and diastolic (congestive) heart failure: Secondary | ICD-10-CM

## 2016-12-30 DIAGNOSIS — I252 Old myocardial infarction: Secondary | ICD-10-CM | POA: Insufficient documentation

## 2016-12-30 DIAGNOSIS — R079 Chest pain, unspecified: Secondary | ICD-10-CM | POA: Diagnosis present

## 2016-12-30 DIAGNOSIS — N179 Acute kidney failure, unspecified: Secondary | ICD-10-CM

## 2016-12-30 DIAGNOSIS — I2510 Atherosclerotic heart disease of native coronary artery without angina pectoris: Secondary | ICD-10-CM | POA: Insufficient documentation

## 2016-12-30 DIAGNOSIS — F1490 Cocaine use, unspecified, uncomplicated: Secondary | ICD-10-CM | POA: Diagnosis present

## 2016-12-30 DIAGNOSIS — Z955 Presence of coronary angioplasty implant and graft: Secondary | ICD-10-CM | POA: Insufficient documentation

## 2016-12-30 DIAGNOSIS — J069 Acute upper respiratory infection, unspecified: Secondary | ICD-10-CM

## 2016-12-30 DIAGNOSIS — Z79899 Other long term (current) drug therapy: Secondary | ICD-10-CM | POA: Insufficient documentation

## 2016-12-30 DIAGNOSIS — Z7982 Long term (current) use of aspirin: Secondary | ICD-10-CM | POA: Insufficient documentation

## 2016-12-30 DIAGNOSIS — R0789 Other chest pain: Principal | ICD-10-CM | POA: Insufficient documentation

## 2016-12-30 DIAGNOSIS — R0602 Shortness of breath: Secondary | ICD-10-CM | POA: Insufficient documentation

## 2016-12-30 DIAGNOSIS — Z87891 Personal history of nicotine dependence: Secondary | ICD-10-CM | POA: Insufficient documentation

## 2016-12-30 HISTORY — DX: Dyspnea, unspecified: R06.00

## 2016-12-30 HISTORY — DX: Essential (primary) hypertension: I10

## 2016-12-30 LAB — INFLUENZA PANEL BY PCR (TYPE A & B)
Influenza A By PCR: NEGATIVE
Influenza B By PCR: NEGATIVE

## 2016-12-30 LAB — CBC
HCT: 44.4 % (ref 39.0–52.0)
Hemoglobin: 15 g/dL (ref 13.0–17.0)
MCH: 29.2 pg (ref 26.0–34.0)
MCHC: 33.8 g/dL (ref 30.0–36.0)
MCV: 86.4 fL (ref 78.0–100.0)
Platelets: 276 10*3/uL (ref 150–400)
RBC: 5.14 MIL/uL (ref 4.22–5.81)
RDW: 14.6 % (ref 11.5–15.5)
WBC: 7.5 10*3/uL (ref 4.0–10.5)

## 2016-12-30 LAB — I-STAT TROPONIN, ED
Troponin i, poc: 0.01 ng/mL (ref 0.00–0.08)
Troponin i, poc: 0 ng/mL (ref 0.00–0.08)

## 2016-12-30 LAB — BASIC METABOLIC PANEL
Anion gap: 7 (ref 5–15)
BUN: 23 mg/dL — ABNORMAL HIGH (ref 6–20)
CO2: 27 mmol/L (ref 22–32)
Calcium: 9.4 mg/dL (ref 8.9–10.3)
Chloride: 103 mmol/L (ref 101–111)
Creatinine, Ser: 1.14 mg/dL (ref 0.61–1.24)
GFR calc Af Amer: >60 mL/min (ref >60)
GFR calc non Af Amer: >60 mL/min (ref >60)
Glucose, Bld: 107 mg/dL — ABNORMAL HIGH (ref 65–99)
Potassium: 4 mmol/L (ref 3.5–5.1)
Sodium: 137 mmol/L (ref 135–145)

## 2016-12-30 LAB — BRAIN NATRIURETIC PEPTIDE: B Natriuretic Peptide: 375.7 pg/mL — ABNORMAL HIGH (ref 0.0–100.0)

## 2016-12-30 LAB — TROPONIN I
Troponin I: <0.03 ng/mL (ref <0.03)
Troponin I: <0.03 ng/mL (ref <0.03)

## 2016-12-30 LAB — D-DIMER, QUANTITATIVE: D-Dimer, Quant: <0.27 ug/mL-FEU (ref 0.00–0.50)

## 2016-12-30 MED ORDER — ACETAMINOPHEN 325 MG PO TABS: 650.0000 mg | ORAL_TABLET | Freq: Four times a day (QID) | ORAL | Status: DC | PRN

## 2016-12-30 MED ORDER — ASPIRIN EC 81 MG PO TBEC
81.0000 mg | DELAYED_RELEASE_TABLET | Freq: Every day | ORAL | Status: DC
  Administered 2016-12-31: 81 mg via ORAL
  Filled 2016-12-30: qty 1

## 2016-12-30 MED ORDER — SENNOSIDES-DOCUSATE SODIUM 8.6-50 MG PO TABS
1.0000 | ORAL_TABLET | Freq: Every day | ORAL | Status: DC
  Filled 2016-12-30: qty 1

## 2016-12-30 MED ORDER — ONDANSETRON HCL 4 MG/2ML IJ SOLN: 4.0000 mg | Freq: Four times a day (QID) | INTRAMUSCULAR | Status: DC | PRN

## 2016-12-30 MED ORDER — ONDANSETRON HCL 4 MG PO TABS: 4.0000 mg | ORAL_TABLET | Freq: Four times a day (QID) | ORAL | Status: DC | PRN

## 2016-12-30 MED ORDER — ENOXAPARIN SODIUM 40 MG/0.4ML ~~LOC~~ SOLN
40.0000 mg | SUBCUTANEOUS | Status: DC
  Administered 2016-12-30: 40 mg via SUBCUTANEOUS
  Filled 2016-12-30: qty 0.4

## 2016-12-30 MED ORDER — ASPIRIN 81 MG PO CHEW
324.0000 mg | CHEWABLE_TABLET | Freq: Once | ORAL | Status: AC
  Administered 2016-12-30: 324 mg via ORAL
  Filled 2016-12-30: qty 4

## 2016-12-30 MED ORDER — ACETAMINOPHEN 650 MG RE SUPP: 650.0000 mg | Freq: Four times a day (QID) | RECTAL | Status: DC | PRN

## 2016-12-30 MED ORDER — NITROGLYCERIN 0.4 MG SL SUBL: 0.4000 mg | SUBLINGUAL_TABLET | SUBLINGUAL | Status: DC | PRN

## 2016-12-30 MED ORDER — SODIUM CHLORIDE 0.9% FLUSH
3.0000 mL | Freq: Two times a day (BID) | INTRAVENOUS | Status: DC
  Administered 2016-12-30 – 2016-12-31 (×2): 3 mL via INTRAVENOUS

## 2016-12-30 NOTE — H&P (Signed)
Date: 12/30/2016               Patient Name:  Tyler Wong MRN: [id number]  DOB: December 29, 1965 Age / Sex: 51 y.o., male   PCP: No Pcp Per Patient         Medical Service: Internal Medicine Teaching Service         Attending Physician: Dr. Gilles Chiquito    First Contact: Dr. Asencion Partridge Pager: [phone]  Second Contact: Dr. Burgess Estelle Pager: [phone]       After Hours (After 5p/  First Contact Pager: [phone]  weekends / holidays): Second Contact Pager: [phone]   Chief Complaint: Chest pain  History of Present Illness: Tyler Wong is a 51 y.o. gentleman with PMH CAD (s/p DES to LAD for STEMI in 2015), HFrEF (LVEF 40-45%), cocaine abuse, tobacco abuse who presents for chest pain and palpitations that began yesterday evening around 7 pm. He was sitting/at rest and left-sided chest pain and palpitations began suddenly, coinciding with dyspnea, intermittent dizziness, and eventually "sweats." He reports the pain across the left side of his chest that feels like constant waxing/waning pressure that does not radiate, non-pleuritic. He reports worsened dyspnea when supine and improvements in his pain and breathing when he stood up and paced the room. He took some Aspirin with no symptom relief. He could not sleep and his pain had not improved by this morning so he decided to come to the ED. He tried taking SL nitro prior to leaving for the ED and experienced pain relief, his pain returned and he took another for symptom relief. He endorses "reflux-like" chest discomfort, increased urination and BMs in the past 24 hours. He is also experiencing about 1 week of congestion, rhinorrhea, cough productive of scant phlegm, and subjective fevers/chills. He is supposed to be taking Brilinta and Aspirin but stopping taking the Brilinta "months to years" ago due to financial reasons. He reports taking no meds daily He has not followed up with his cardiologist. He denies orthopnea, leg swelling, denies abdominal  pain, nausea, constipation, diarrhea.  In the ED - vitals T 97.9, HR 96, RR 16, BP 122/86, 97% on RA. Received aspirin 325 mg and labs remarkable for BNP 375. Troponin 0.01 and 0.00. CBC, BMP, d-dimer unremarkable. CXR with no acute findings and EKG with PACs, biatrial enlargement, anteroseptal infarct but unchanged from previous. IMTS contacted for admission.   Meds:  Current Meds  Medication Sig  . aspirin 325 MG tablet Take 325 mg by mouth every 6 (six) hours as needed for mild pain.  . Multiple Vitamin (MULTIVITAMIN) tablet Take 1 tablet by mouth daily.  . nitroGLYCERIN (NITROSTAT) 0.4 MG SL tablet Place 1 tablet (0.4 mg total) under the tongue every 5 (five) minutes as needed for chest pain.  . Pseudoeph-CPM-DM-APAP (NIGHTTIME COLD/COUGH PO) Take 1 capsule by mouth at bedtime as needed (cold).  . ticagrelor (BRILINTA) 90 MG TABS tablet Take 90 mg by mouth 2 (two) times daily.    Allergies: Allergies as of 12/30/2016  . (No Known Allergies)   Past Medical History:  Diagnosis Date  . CAD in native artery 07/17/14   STEMI- LAD stenosis with DES  . Cocaine abuse   . ST elevation myocardial infarction (STEMI) involving left anterior descending (LAD) coronary artery with complication ([place])   . Tobacco abuse 07/17/2014    Family History:  Family History  Problem Relation Age of Onset  . Heart attack Mother   . Heart attack  Sister   . Heart attack Brother    Social History:  Social History   Social History  . Marital status: Single    Spouse name: N/A  . Number of children: N/A  . Years of education: N/A   Occupational History  . Landscaping    Social History Main Topics  . Smoking status: Former Smoker    Packs/day: 1.00    Types: Cigarettes    Quit date: 07/17/2014  . Smokeless tobacco: Never Used  . Alcohol use No     Comment: Patient denies abuse, states social drinker  . Drug use: No     Comment: heroin  . Sexual activity: Not on file   Other Topics Concern    . Not on file   Social History Narrative   Lives in [place] with his sister and girlfriend. He works Aeronautical engineer work.     Review of Systems: A complete ROS was negative except as per HPI.   Physical Exam: Blood pressure 113/73, pulse 94, temperature 98.3 F (36.8 C), temperature source Oral, resp. rate 18, height 5' 10.5" (1.791 m), weight 76.7 kg (169 lb), SpO2 97 %.  General appearance: Well-developed African American gentleman resting comfortably in bed, in no distress HENT: Normocephalic, atraumatic, moist mucous membranes, oropharynx clear, no lymphadenopathy Eyes: PERRL, EOM inact, non-icteric Cardiovascular: Regular rate and rhythm, no murmurs, rubs, gallops Respiratory: Clear to auscultation bilaterally, normal work of breathing Abdomen: BS+, soft, non-tender, non-distended Extremities: Normal bulk and range of motion, no edema, 2+ peripheral pulses Skin: Warm, dry, intact Neuro: Alert and oriented, cranial nerves grossly intact Psych: Appropriate affect, clear speech, thoughts linear and goal-directed   Assessment & Plan by Problem: Active Problems:   Chest pain  Chest pain, left-sided, constant pressure starting yesterday evening, relieved with nitroglycerin. Coinciding with dyspnea, dizziness, sweating, palpitations. Patient with history of STEMI and DES noncompliant with antiplatelet therapy, active tobacco abuse and history of cocaine abuse. Also with history of mild HFrEF 40-45% in 2015 with multifocal akinesis. Initial troponins negative x2, negative Ddimer, no clear EKG or CXR changes, BNP mildly elevated. ACS rule-out and may need further ischemic evaluation/workup. Current asymptomatic/pain-free at time of our evaluation. - Trend serial troponins - Repeat EKG in AM - UDS to screen for recent cocaine use - TTE - Telemetry, pulse ox - Cardiology consulted from ED, appreciate recs - prn Nitro for chest pain - Aspirin 81 mg daily  Upper  respiratory infection, 1 week of congestion, rhinorrhea, cough, subjective f/c - CXR clear, no leukocytosis - no overt symptoms currently - Trend temp and CBC - Check influenza A/B - negative - prns for symptom relief  FEN/GI: HH diet, replete electrolytes as needed  DVT ppx: Lovenox  Code status: Full code  Dispo: Admit patient to Observation with expected length of stay less than 2 midnights.  Signed: Asencion Partridge, MD 12/30/2016, 10:04 PM  Pager: [phone]

## 2016-12-30 NOTE — ED Triage Notes (Signed)
Patient reports central chest pain with palpitations , SOB and dry cough onset last night , denies nausea or diaphoresis. He has a history of CAD/Coronary stent placement .

## 2016-12-30 NOTE — ED Notes (Signed)
Attempted report x1. 

## 2016-12-30 NOTE — ED Provider Notes (Signed)
[facility] DEPT Provider Note   CSN: [id number] Arrival date & time: 12/30/16  0520     History   Chief Complaint Chief Complaint  Patient presents with  . Chest Pain    HPI Tyler Wong Current is a 51 y.o. male.  HPI   Last night had chest tightness, fluttering, dizziness, couldn't catch breath. Took some nitro and came here. Tried to wait it out and thought it would pass but it didn't.  When went into cold air chest pain better, but fluttering in chest continued. Nitro helped. Also seemed to be better with ambulation.  Slight dizziness, lightheadedness.  Pain similar in quality to past MI but not as severe.  Pain was left side of chest, no radiation. Associated dyspnea and orthopnea.   Smoking cigarettes. Cath 2015 for STEMI. Not currently using cocaine.   Took nyquil gel caps. Has been feeling hot and cold.  Cough for 1 week, nasal congestion.    Past Medical History:  Diagnosis Date  . CAD in native artery 07/17/14   STEMI- LAD stenosis with DES  . Cocaine abuse   . ST elevation myocardial infarction (STEMI) involving left anterior descending (LAD) coronary artery with complication ([place])   . Tobacco abuse 07/17/2014    Patient Active Problem List   Diagnosis Date Noted  . Chest pain 12/30/2016  . ST elevation (STEMI) myocardial infarction involving left anterior descending coronary artery ([place]) 07/18/2014  . STEMI (ST elevation myocardial infarction) ([place]) 07/17/2014  . Tobacco abuse 07/17/2014  . Cocaine use 07/17/2014    Past Surgical History:  Procedure Laterality Date  . CORONARY ANGIOPLASTY WITH STENT PLACEMENT  07/18/14   resolute DES to LAD STEMI  . LEFT HEART CATH N/A 07/17/2014   Procedure: LEFT HEART CATH;  Surgeon: [name], MD;  Location: [facility] CATH LAB;  Service: Cardiovascular;  Laterality: N/A;  . PERCUTANEOUS CORONARY STENT INTERVENTION (PCI-S)  07/17/2014   Procedure: PERCUTANEOUS CORONARY STENT INTERVENTION (PCI-S);  Surgeon: [name], MD;   Location: [facility] CATH LAB;  Service: Cardiovascular;;       Home Medications    Prior to Admission medications   Medication Sig Start Date End Date Taking? Authorizing Provider  aspirin 325 MG tablet Take 325 mg by mouth every 6 (six) hours as needed for mild pain.   Yes Historical Provider, MD  Multiple Vitamin (MULTIVITAMIN) tablet Take 1 tablet by mouth daily.   Yes Historical Provider, MD  nitroGLYCERIN (NITROSTAT) 0.4 MG SL tablet Place 1 tablet (0.4 mg total) under the tongue every 5 (five) minutes as needed for chest pain. 07/20/14  Yes Rhonda G Barrett, PA-C  Pseudoeph-CPM-DM-APAP (NIGHTTIME COLD/COUGH PO) Take 1 capsule by mouth at bedtime as needed (cold).   Yes Historical Provider, MD  ticagrelor (BRILINTA) 90 MG TABS tablet Take 90 mg by mouth 2 (two) times daily.   Yes Historical Provider, MD  predniSONE (DELTASONE) 20 MG tablet Take 3 tablets (60 mg total) by mouth daily. Patient not taking: Reported on 12/30/2016 09/01/16   Everlene Balls, MD    Family History Family History  Problem Relation Age of Onset  . Heart attack Mother   . Heart attack Sister   . Heart attack Brother     Social History Social History  Substance Use Topics  . Smoking status: Former Smoker    Packs/day: 0.50    Years: 30.00    Types: Cigarettes    Quit date: 07/17/2014  . Smokeless tobacco: Never Used  . Alcohol use  No     Comment: Patient denies abuse, states social drinker     Allergies   Patient has no known allergies.   Review of Systems Review of Systems  Constitutional: Positive for diaphoresis. Negative for fever.  HENT: Positive for congestion. Negative for sore throat.   Eyes: Negative for visual disturbance.  Respiratory: Positive for cough (1 week, not productive) and shortness of breath.   Cardiovascular: Positive for chest pain. Negative for leg swelling.  Gastrointestinal: Positive for nausea. Negative for abdominal pain, diarrhea and vomiting.  Genitourinary: Negative for  difficulty urinating.  Musculoskeletal: Negative for back pain and neck stiffness.  Skin: Negative for rash.  Neurological: Negative for syncope and headaches.     Physical Exam Updated Vital Signs BP 113/73 (BP Location: Left Arm)   Pulse 94   Temp 98.3 F (36.8 C) (Oral)   Resp 18   Ht 5' 10.5" (1.791 m)   Wt 169 lb (76.7 kg)   SpO2 97%   BMI 23.91 kg/m   Physical Exam  Constitutional: He is oriented to person, place, and time. He appears well-developed and well-nourished. No distress.  HENT:  Head: Normocephalic and atraumatic.  Eyes: Conjunctivae and EOM are normal.  Neck: Normal range of motion.  Cardiovascular: Normal rate, regular rhythm, normal heart sounds and intact distal pulses.  Exam reveals no gallop and no friction rub.   No murmur heard. Pulmonary/Chest: Effort normal and breath sounds normal. No respiratory distress. He has no wheezes. He has no rales.  Abdominal: Soft. He exhibits no distension. There is no tenderness. There is no guarding.  Musculoskeletal: He exhibits no edema.  Neurological: He is alert and oriented to person, place, and time.  Skin: Skin is warm and dry. He is not diaphoretic.  Nursing note and vitals reviewed.    ED Treatments / Results  Labs (all labs ordered are listed, but only abnormal results are displayed) Labs Reviewed  BASIC METABOLIC PANEL - Abnormal; Notable for the following:       Result Value   Glucose, Bld 107 (*)    BUN 23 (*)    All other components within normal limits  BRAIN NATRIURETIC PEPTIDE - Abnormal; Notable for the following:    B Natriuretic Peptide 375.7 (*)    All other components within normal limits  CBC  D-DIMER, QUANTITATIVE (NOT AT [facility])  TROPONIN I  INFLUENZA PANEL BY PCR (TYPE A & B, H1N1)  TROPONIN I  TROPONIN I  COMPREHENSIVE METABOLIC PANEL  CBC  RAPID URINE DRUG SCREEN, HOSP PERFORMED  I-STAT TROPOININ, ED  I-STAT TROPOININ, ED    EKG  EKG  Interpretation  Date/Time:  Wednesday December 30 2016 05:30:27 EST Ventricular Rate:  98 PR Interval:  154 QRS Duration: 88 QT Interval:  382 QTC Calculation: 487 R Axis:   -61 Text Interpretation:  Sinus rhythm with Premature atrial complexes Biatrial enlargement Anteroseptal infarct , age undetermined Abnormal ECG Similar ECG to 21-Jul02015 Confirmed by [facility] MD, Juno Bozard ([id number]) on 12/30/2016 9:04:57 AM       Radiology Dg Chest 2 View  Result Date: 12/30/2016 CLINICAL DATA:  Chest pain and dyspnea tonight. EXAM: CHEST  2 VIEW COMPARISON:  04/13/2016 FINDINGS: The heart size and mediastinal contours are within normal limits. Both lungs are clear. The visualized skeletal structures are unremarkable. IMPRESSION: No active cardiopulmonary disease. Electronically Signed   By: Andreas Newport M.D.   On: 12/30/2016 05:51    Procedures Procedures (including critical care time)  Medications  Ordered in ED Medications  enoxaparin (LOVENOX) injection 40 mg (40 mg Subcutaneous Given 12/30/16 2058)  sodium chloride flush (NS) 0.9 % injection 3 mL (3 mLs Intravenous Given 12/30/16 2111)  acetaminophen (TYLENOL) tablet 650 mg (not administered)    Or  acetaminophen (TYLENOL) suppository 650 mg (not administered)  senna-docusate (Senokot-S) tablet 1 tablet (1 tablet Oral Not Given 12/30/16 2111)  ondansetron (ZOFRAN) tablet 4 mg (not administered)    Or  ondansetron (ZOFRAN) injection 4 mg (not administered)  aspirin EC tablet 81 mg (not administered)  nitroGLYCERIN (NITROSTAT) SL tablet 0.4 mg (not administered)  aspirin chewable tablet 324 mg (324 mg Oral Given 12/30/16 1047)     Initial Impression / Assessment and Plan / ED Course  I have reviewed the triage vital signs and the nursing notes.  Pertinent labs & imaging results that were available during my care of the patient were reviewed by me and considered in my medical decision making (see chart for details).  Clinical Course    51yo  male with hx of CAD, past polysubstance abuse presents with concern for chest pain. ECG unchanged. Troponin negative. DDimer neg and pt low risk [place].  Doubt dissection given normal bilateral pulses, normal XR, hx. No sign of pneumonia nor pneumothorax.  Pt CP free at this time.  Pt with some atypical features, however is high risk, has not had cardiac work up since prior cath and after discussion with pt will admit for chest pain observation. Given ASA.  Final Clinical Impressions(s) / ED Diagnoses   Final diagnoses:  Chest pain, unspecified type    New Prescriptions Current Discharge Medication List       Gareth Morgan, MD 12/30/16 2244

## 2016-12-30 NOTE — Progress Notes (Signed)
Pt educated & informed that he's in a safety video room. Pt stated it was okay to remain in the room. Hoover Brunette, RN

## 2016-12-31 ENCOUNTER — Observation Stay (HOSPITAL_BASED_OUTPATIENT_CLINIC_OR_DEPARTMENT_OTHER): Payer: Self-pay

## 2016-12-31 ENCOUNTER — Encounter (HOSPITAL_COMMUNITY): Payer: Self-pay | Admitting: General Practice

## 2016-12-31 DIAGNOSIS — R079 Chest pain, unspecified: Secondary | ICD-10-CM

## 2016-12-31 DIAGNOSIS — F1410 Cocaine abuse, uncomplicated: Secondary | ICD-10-CM

## 2016-12-31 DIAGNOSIS — R0789 Other chest pain: Secondary | ICD-10-CM

## 2016-12-31 DIAGNOSIS — I340 Nonrheumatic mitral (valve) insufficiency: Secondary | ICD-10-CM

## 2016-12-31 LAB — CBC
HCT: 41.4 % (ref 39.0–52.0)
Hemoglobin: 14 g/dL (ref 13.0–17.0)
MCH: 29.2 pg (ref 26.0–34.0)
MCHC: 33.8 g/dL (ref 30.0–36.0)
MCV: 86.3 fL (ref 78.0–100.0)
Platelets: 279 10*3/uL (ref 150–400)
RBC: 4.8 MIL/uL (ref 4.22–5.81)
RDW: 14.6 % (ref 11.5–15.5)
WBC: 5.9 10*3/uL (ref 4.0–10.5)

## 2016-12-31 LAB — COMPREHENSIVE METABOLIC PANEL
ALT: 17 U/L (ref 17–63)
AST: 17 U/L (ref 15–41)
Albumin: 3 g/dL — ABNORMAL LOW (ref 3.5–5.0)
Alkaline Phosphatase: 73 U/L (ref 38–126)
Anion gap: 7 (ref 5–15)
BUN: 18 mg/dL (ref 6–20)
CO2: 26 mmol/L (ref 22–32)
Calcium: 8.8 mg/dL — ABNORMAL LOW (ref 8.9–10.3)
Chloride: 106 mmol/L (ref 101–111)
Creatinine, Ser: 1.34 mg/dL — ABNORMAL HIGH (ref 0.61–1.24)
GFR calc Af Amer: >60 mL/min (ref >60)
GFR calc non Af Amer: 60 mL/min — ABNORMAL LOW (ref >60)
Glucose, Bld: 113 mg/dL — ABNORMAL HIGH (ref 65–99)
Potassium: 4.1 mmol/L (ref 3.5–5.1)
Sodium: 139 mmol/L (ref 135–145)
Total Bilirubin: 1.1 mg/dL (ref 0.3–1.2)
Total Protein: 6 g/dL — ABNORMAL LOW (ref 6.5–8.1)

## 2016-12-31 LAB — RAPID URINE DRUG SCREEN, HOSP PERFORMED
Amphetamines: NOT DETECTED
Barbiturates: NOT DETECTED
Benzodiazepines: NOT DETECTED
Cocaine: POSITIVE — AB
Opiates: NOT DETECTED
Tetrahydrocannabinol: NOT DETECTED

## 2016-12-31 LAB — ECHOCARDIOGRAM COMPLETE
Height: 70.5 in
Weight: 2664 oz

## 2016-12-31 LAB — LIPID PANEL
Cholesterol: 191 mg/dL (ref 0–200)
HDL: 54 mg/dL (ref >40)
LDL Cholesterol: 112 mg/dL — ABNORMAL HIGH (ref 0–99)
Total CHOL/HDL Ratio: 3.5 RATIO
Triglycerides: 125 mg/dL (ref <150)
VLDL: 25 mg/dL (ref 0–40)

## 2016-12-31 LAB — GLUCOSE, CAPILLARY: Glucose-Capillary: 140 mg/dL — ABNORMAL HIGH (ref 65–99)

## 2016-12-31 MED ORDER — NITROGLYCERIN 0.4 MG SL SUBL: 0.4000 mg | SUBLINGUAL_TABLET | SUBLINGUAL | 3 refills | Status: DC | PRN

## 2016-12-31 MED ORDER — LISINOPRIL 5 MG PO TABS
5.0000 mg | ORAL_TABLET | Freq: Every day | ORAL | 2 refills | Status: DC
Start: 2016-12-31 — End: 2017-01-18

## 2016-12-31 MED ORDER — ASPIRIN 81 MG PO TBEC: 81.0000 mg | DELAYED_RELEASE_TABLET | Freq: Every day | ORAL | 2 refills | Status: DC

## 2016-12-31 MED ORDER — LISINOPRIL 5 MG PO TABS
5.0000 mg | ORAL_TABLET | Freq: Every day | ORAL | Status: DC
  Administered 2016-12-31: 5 mg via ORAL

## 2016-12-31 MED ORDER — LISINOPRIL 5 MG PO TABS
5.0000 mg | ORAL_TABLET | Freq: Every day | ORAL | 2 refills | Status: DC
Start: 2016-12-31 — End: 2016-12-31

## 2016-12-31 MED ORDER — ROSUVASTATIN CALCIUM 20 MG PO TABS: 20.0000 mg | ORAL_TABLET | Freq: Every day | ORAL | 2 refills | Status: DC

## 2016-12-31 MED ORDER — ROSUVASTATIN CALCIUM 10 MG PO TABS: 20.0000 mg | ORAL_TABLET | Freq: Every day | ORAL | Status: DC

## 2016-12-31 NOTE — Progress Notes (Signed)
Stopped by to visit w/ pt, who is better and will be discharged. It had been scary for him when he couldn't catch his breath. He was appreciative of the visit, conversation, prayer for healing, and copy of the New Testament/[place].Chaplain available for f/u.    12/31/16 1600  Clinical Encounter Type  Visited With Patient  Visit Type Follow-up;Psychological support;Spiritual support;Social support  Referral From Nurse  Spiritual Encounters  Spiritual Needs Prayer;Emotional  Stress Factors  Patient Stress Factors Health changes;Loss of control   Gerrit Heck, Chaplain

## 2016-12-31 NOTE — Discharge Instructions (Signed)
Angina Pectoris Angina pectoris is a very bad feeling in the chest, neck, or arm. Your doctor may call it angina. There are four types of angina. Angina is caused by a lack of blood in the middle and thickest layer of the heart wall (myocardium). Angina may feel like a crushing or squeezing pain in the chest. It may feel like tightness or heavy pressure in the chest. Some people say it feels like gas, heartburn, or indigestion. Some people have symptoms other than pain. These include:  Shortness of breath.  Cold sweats.  Feeling sick to your stomach (nausea).  Feeling light-headed.  Many women have chest discomfort and some of the other symptoms. However, women often have different symptoms, such as:  Feeling tired (fatigue).  Feeling nervous for no reason.  Feeling weak for no reason.  Dizziness or fainting.  Women may have angina without any symptoms. Follow these instructions at home:  Take medicines only as told by your doctor.  Take care of other health issues as told by your doctor. These include: ? High blood pressure (hypertension). ? Diabetes.  Follow a heart-healthy diet. Your doctor can help you to choose healthy food options and make changes.  Talk to your doctor to learn more about healthy cooking methods and use them. These include: ? Roasting. ? Grilling. ? Broiling. ? Baking. ? Poaching. ? Steaming. ? Stir-frying.  Follow an exercise program approved by your doctor.  Keep a healthy weight. Lose weight as told by your doctor.  Rest when you are tired.  Learn to manage stress.  Do not use any tobacco, such as cigarettes, chewing tobacco, or electronic cigarettes. If you need help quitting, ask your doctor.  If you drink alcohol, and your doctor says it is okay, limit yourself to no more than 1 drink per day. One drink equals 12 ounces of beer, 5 ounces of wine, or 1 ounces of hard liquor.  Stop illegal drug use.  Keep all follow-up visits as told  by your doctor. This is important. Do not take these medicines unless your doctor says that you can:  Nonsteroidal anti-inflammatory drugs (NSAIDs). These include: ? Ibuprofen. ? Naproxen. ? Celecoxib.  Vitamin supplements that have vitamin A, vitamin E, or both.  Hormone therapy that contains estrogen with or without progestin.  Get help right away if:  You have pain in your chest, neck, arm, jaw, stomach, or back that: ? Lasts more than a few minutes. ? Comes back. ? Does not get better after you take medicine under your tongue (sublingual nitroglycerin).  You have any of these symptoms for no reason: ? Gas, heartburn, or indigestion. ? Sweating a lot. ? Shortness of breath or trouble breathing. ? Feeling sick to your stomach or throwing up. ? Feeling more tired than usual. ? Feeling nervous or worrying more than usual. ? Feeling weak. ? Diarrhea.  You are suddenly dizzy or light-headed.  You faint or pass out. These symptoms may be an emergency. Do not wait to see if the symptoms will go away. Get medical help right away. Call your local emergency services (911 in the [place] Do not drive yourself to the hospital. This information is not intended to replace advice given to you by your health care provider. Make sure you discuss any questions you have with your health care provider. Document Released: 06/01/2008 Document Revised: 05/21/2016 Document Reviewed: 04/17/2014 Elsevier Interactive Patient Education  2017 Elsevier Inc.  

## 2016-12-31 NOTE — Care Management Note (Signed)
Case Management Note  Patient Details  Name: Roddy Laidig MRN: [id number] Date of Birth: [birth year]  Subjective/Objective:  Pt presented for Chest Pain. Pt is without insurance and a job. Pt last worked in December (seasonal). Pt states he is living with family at this time. 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                 Action/Plan: CM did try to call the [facility] to establish PCP. No appointments available for Hospital f/u at this time. Pt will have to call on Monday 01-04-17 for an appointment. Pt will still be able to utilize the pharmacy and pt is aware that medications range in cost from $4.00-$10.00. CM will provide pt with bus pas home. No further needs from CM @ this time.   Expected Discharge Date:                  Expected Discharge Plan:  Home/Self Care  In-House Referral:  NA  Discharge planning Services  CM Consult, Medication Assistance, [facility]  Post Acute Care Choice:  NA Choice offered to:  NA  DME Arranged:  N/A DME Agency:  NA  HH Arranged:  NA HH Agency:  NA  Status of Service:  Completed, signed off  If discussed at [place] of Stay Meetings, dates discussed:    Additional Comments:  [name], RN 12/31/2016, 12:49 PM

## 2016-12-31 NOTE — Progress Notes (Signed)
PT Cancellation Note  Patient Details Name: Giuliano Buckmaster MRN: [id number] DOB: 1966-03-11   Cancelled Treatment:    Reason Eval/Treat Not Completed: PT screened, no needs identified, will sign off Pt ambulating and negotiating stairs independently without issues or LOB. HR ranged from 97-117 bpm. Encouraged ambulation while in hospital. No needs identified. Signing off.    Marguarite Arbour A Candid Bovey 12/31/2016, 10:53 AM Wray Kearns, PT, DPT [phone]

## 2016-12-31 NOTE — Progress Notes (Signed)
Subjective: Tyler Wong has no acute complaints this morning. He denies chest pain, shortness of breath, or recurrence of palpitations. He is curious to know the results of his echocardiogram and this was discussed. We also discussed his recent cocaine + UDS and dangers of this substance, especially in those with a cardiac history and he voiced strong understanding - asking several questions about other ways to improve his health.  Objective: Vital signs in last 24 hours: Vitals:   12/30/16 1445 12/30/16 1526 12/30/16 2053 12/31/16 0602  BP: 124/91 (!) 114/93 113/73 (!) 123/94  Pulse: 91 93 94 98  Resp: 23 20 18 18   Temp:  98.2 F (36.8 C) 98.3 F (36.8 C) 98 F (36.7 C)  TempSrc:  Oral Oral Oral  SpO2: 95% 96% 97% 96%  Weight:  76.7 kg (169 lb)  75.5 kg (166 lb 8 oz)  Height:  5' 10.5" (1.791 m)      Intake/Output Summary (Last 24 hours) at 12/31/16 1015 Last data filed at 12/31/16 [id number]  Gross per 24 hour  Intake              220 ml  Output              400 ml  Net             -180 ml    Physical Exam Physical Exam  Constitutional: He is oriented to person, place, and time. He appears well-developed and well-nourished. No distress.  HENT:  Head: Normocephalic and atraumatic.  Mouth/Throat: Oropharynx is clear and moist.  Neck: Normal range of motion. Neck supple.  Cardiovascular: Normal rate and regular rhythm.   Murmur (3/6 holosystolic best heard over apex) heard. Pulmonary/Chest: Effort normal and breath sounds normal. No respiratory distress.  Abdominal: Soft. There is no tenderness.  Musculoskeletal: Normal range of motion. He exhibits no edema.  Neurological: He is alert and oriented to person, place, and time.  Skin: Skin is warm and dry.   Labs / Imaging / Procedures: CBC Latest Ref Rng & Units 12/31/2016 12/30/2016 04/13/2016  WBC 4.0 - 10.5 K/uL 5.9 7.5 8.2  Hemoglobin 13.0 - 17.0 g/dL 14.0 15.0 14.6  Hematocrit 39.0 - 52.0 % 41.4 44.4 44.7  Platelets 150 -  400 K/uL 279 276 263   BMP Latest Ref Rng & Units 12/31/2016 12/30/2016 04/13/2016  Glucose 65 - 99 mg/dL 113(H) 107(H) 142(H)  BUN 6 - 20 mg/dL 18 23(H) 19  Creatinine 0.61 - 1.24 mg/dL 1.34(H) 1.14 1.14  Sodium 135 - 145 mmol/L 139 137 139  Potassium 3.5 - 5.1 mmol/L 4.1 4.0 4.7  Chloride 101 - 111 mmol/L 106 103 104  CO2 22 - 32 mmol/L 26 27 26   Calcium 8.9 - 10.3 mg/dL 8.8(L) 9.4 8.8(L)   TTE 12/31/16  Study Conclusions  - Left ventricle: The cavity size was normal. Systolic function was   moderately to severely reduced. The estimated ejection fraction   was in the range of 30% to 35%. There is akinesis of the   mid-apicalanteroseptal, inferoseptal, and apical myocardium.   Doppler parameters are consistent with abnormal left ventricular   relaxation (grade 1 diastolic dysfunction). - Mitral valve: There was moderate to severe regurgitation. - Pulmonary arteries: Systolic pressure was mildly to moderately   increased. PA peak pressure: 48 mm Hg (S).  Impressions:  - Since last echo EF decreased from 45 to 35%  Also, moderate to severe mitral regurgitation  Assessment/Plan: Angelia Mordhorst is a 51  y.o. man with PMH CAD (hx STEMI s/p DES to LAD in 2015), cocaine abuse, HFrEF 40-45% previously admitted for chest pain and palpitations and URI.   Chest pain, resolved, remains asymptomatic following nitro administration, serial troponins negative, no significant changes on EKG or CXR, mild BNP elevation to 375 asymptomatic/pain-free at time of our evaluation. Now with worsening systolic heart failure and cocaine + UDS. - Cardiology consulted this AM, appreciate recs - TTE today revealed worsening EF to 30-35%, and mitral regurg - Will start Lisinopril 5 mg daily as SBP remains in 110-120s, can titrate up as needed outpatient - Will consider adding Imdur outpatient, avoid starting beta blockers due to concern over possible cocaine use - Start Crestor 20 mg daily history of STEMI and DES  to LAD plaque/stenosis - prn Nitro for chest pain - Aspirin 81 mg daily  Chronic systolic heart failure, EF 35% from 45% in 2015, due to cocaine abuse vs uncontrolled HTN - Begin ACE inhibitor today, avoiding beta blockers for now  Cocaine abuse - admitted to use within past week, discussed abstinence from further use with patient   Upper respiratory infection, 1 week of congestion, rhinorrhea, cough, subjective f/c - CXR clear, no leukocytosis - no overt symptoms currently. Influenza A/B - Conservative/symptomatic management  AKI, creatinine 1.34 today from baseline 1.1 - Encouraged hydration - Recheck as outpatient  Dispo: Anticipated discharge today.  LOS: 0 days   Asencion Partridge, MD 12/31/2016, 10:15 AM Pager: [phone]

## 2016-12-31 NOTE — Progress Notes (Signed)
  Echocardiogram 2D Echocardiogram has been performed.  Dmani, Eckerd 12/31/2016, 9:21 AM

## 2016-12-31 NOTE — Discharge Summary (Signed)
Name: Tyler Wong MRN: [id number] DOB: 11-Dec-1966 51 y.o. PCP: No Pcp Per Patient  Date of Admission: 12/30/2016  8:37 AM Date of Discharge: 12/31/2016 Attending Physician: Sid Falcon, MD  Discharge Diagnosis: 1. Cocaine-induced chest pain 2. Chronic combined systolic and diastolic congestive heart failure  Principal Problem:   Chest pain Active Problems:   Cocaine use   Chronic combined systolic and diastolic congestive heart failure (HCC)   Viral URI   AKI (acute kidney injury) ([place])   Discharge Medications: Allergies as of 12/31/2016   No Known Allergies     Medication List    STOP taking these medications   aspirin 325 MG tablet Replaced by:  aspirin 81 MG EC tablet   NIGHTTIME COLD/COUGH PO   predniSONE 20 MG tablet Commonly known as:  DELTASONE   ticagrelor 90 MG Tabs tablet Commonly known as:  BRILINTA     TAKE these medications   aspirin 81 MG EC tablet Take 1 tablet (81 mg total) by mouth daily. Replaces:  aspirin 325 MG tablet   lisinopril 5 MG tablet Commonly known as:  PRINIVIL,ZESTRIL Take 1 tablet (5 mg total) by mouth daily.   multivitamin tablet Take 1 tablet by mouth daily.   nitroGLYCERIN 0.4 MG SL tablet Commonly known as:  NITROSTAT Place 1 tablet (0.4 mg total) under the tongue every 5 (five) minutes as needed for chest pain.   rosuvastatin 20 MG tablet Commonly known as:  CRESTOR Take 1 tablet (20 mg total) by mouth daily at 6 PM.       Disposition and follow-up:   Mr.Siris Stutzman was discharged from [facility] in Stable condition.  At the hospital follow up visit please address:  1.  Chest pain - assess for anginal symptoms, palpitations  Chronic systolic heart failure - assess for orthopnea, PND, leg swelling, dyspnea, exercise intolerance, salt intake. Started Lisinopril 5 mg on discharge, titrate up to 10mg  daily if tolerated. Also may tolerate addition of Imdur for optimal medical management of  CHF.  Cocaine abuse - assess for use or abstinence from cocaine and other recreational drugs  Upper respiratory infection - assess for resolution of cold symptoms  AKI - mild creatitine elevation to 1.34 on day of discharge from baseline 1.1, also started ACE inhibitor  2.  Labs / imaging needed at time of follow-up: Urine drug screen, BMP  3.  Pending labs/ test needing follow-up: None  Follow-up Appointments: Follow-up Information    [facility]. Go on 01/14/2017.   Why:  At 9:45 AM for hospital follow up, please arrive 15 minutes early to check in. Contact information: [address] [place] [facility] Course by problem list: Principal Problem:   Chest pain Active Problems:   Cocaine use   Chronic combined systolic and diastolic congestive heart failure (HCC)   Viral URI   AKI (acute kidney injury) ([place])   1. Chest pain Tyler Wong is a 51 y.o. gentleman with PMH CAD (s/p DES to LAD for STEMI in 2015), HFrEF (LVEF 40-45%), cocaine abuse, tobacco abuse who presented on 1/3 for chest pain and palpitations that started the previous evening and seemed to resolve with nitroglycerin administration. Also was experiencing one week of cold symptoms. He had self-discontinued all medications (including antiplatelets) and had been lost to follow up with his cardiologist. He had no significant EKG changes on presentation, serial troponins negative,  but had elevated BNP, and UDS was positive for cocaine. He initially denied cocaine use but later admitted to recent use around new years. Telemetry revealed no arrhythmias and he remained completely asymptomatic from shortly after admission onward. TTE on 1/4 revealed LVEF worsened from previous at 30-35% with grade 1 diastolic dysfunction, mitral regurgitation, and moderate PAH. No signs/symptoms of CHF on exam. He was restarted on Lisinopril, Crestor, and Aspirin 81 mg for  optimal medical management of HTN, HLD, CHF. Decision was made to hold addition of beta blocker given his possible continued cocaine abuse, but would benefit from addition of Imdur in the future.    Discharge Vitals:   BP 124/73 (BP Location: Left Arm)   Pulse 91   Temp 97.5 F (36.4 C) (Axillary)   Resp 18   Ht 5' 10.5" (1.791 m)   Wt 166 lb 8 oz (75.5 kg)   SpO2 97%   BMI 23.55 kg/m   Pertinent Labs, Studies, and Procedures:   Lipid Panel     Component Value Date/Time   CHOL 191 12/31/2016 0323   TRIG 125 12/31/2016 0323   HDL 54 12/31/2016 0323   CHOLHDL 3.5 12/31/2016 0323   VLDL 25 12/31/2016 0323   LDLCALC 112 (H) 12/31/2016 0323   Drugs of Abuse     Component Value Date/Time   LABOPIA NONE DETECTED 12/30/2016 0018   COCAINSCRNUR POSITIVE (A) 12/30/2016 0018   LABBENZ NONE DETECTED 12/30/2016 0018   AMPHETMU NONE DETECTED 12/30/2016 0018   THCU NONE DETECTED 12/30/2016 0018   LABBARB NONE DETECTED 12/30/2016 0018    Results for TARRIS, BHAT (MRN [id number]) as of 01/04/2017 22:27  Ref. Range 12/30/2016 18:25 12/30/2016 23:10  Troponin I Latest Ref Range: <0.03 ng/mL <0.03 <0.03   Transthoracic echocardiogram - 12/31/2016  Study Conclusions  - Left ventricle: The cavity size was normal. Systolic function was   moderately to severely reduced. The estimated ejection fraction   was in the range of 30% to 35%. There is akinesis of the   mid-apicalanteroseptal, inferoseptal, and apical myocardium.   Doppler parameters are consistent with abnormal left ventricular   relaxation (grade 1 diastolic dysfunction). - Mitral valve: There was moderate to severe regurgitation. - Pulmonary arteries: Systolic pressure was mildly to moderately   increased. PA peak pressure: 48 mm Hg (S).  Impressions: - Since last echo EF decreased from 45 to 35%  EKG 12/31/16   Discharge Instructions: Discharge Instructions    ([place]) Call MD:  Anytime you have any of the  following symptoms: 1) 3 pound weight gain in 24 hours or 5 pounds in 1 week 2) shortness of breath, with or without a dry hacking cough 3) swelling in the hands, feet or stomach 4) if you have to sleep on extra pillows at night in order to breathe.    Complete by:  As directed    Call MD for:  difficulty breathing, headache or visual disturbances    Complete by:  As directed    Call MD for:  extreme fatigue    Complete by:  As directed    Call MD for:  severe uncontrolled pain    Complete by:  As directed    Diet - low sodium heart healthy    Complete by:  As directed    Discharge instructions    Complete by:  As directed    Please start taking Aspirin (81 mg), Lisinopril (5 mg), and Rosuvastatin (20 mg) daily from now on.  Please avoid smoking, excess drinking, and ANY drug use. Please adhere to a low-sodium diet, exercise regularly, and stay well-hydrated. Please return to our internal medicine clinic in 2 weeks for a follow up visit.   If you develop sudden chest pain, shortness of breath, palpitations, leg swelling, difficulty breathing when laying flat, or passing out please return to the ED for immediate evaluation.   Increase activity slowly    Complete by:  As directed       Signed: Asencion Partridge, MD 01/04/2017, 10:44 PM   Pager: [phone]

## 2016-12-31 NOTE — Progress Notes (Signed)
Responded to consult for major life transition. Pt out of rm for tests. Will check back upon his return.   12/31/16 0900  Clinical Encounter Type  Visited With Patient not available  Visit Type Initial  Referral From Nurse   Gerrit Heck, Chaplain

## 2016-12-31 NOTE — Progress Notes (Signed)
OT Cancellation Note  Patient Details Name: Tyler Wong MRN: [id number] DOB: Jul 20, 1966   Cancelled Treatment:    Reason Eval/Treat Not Completed: OT screened, no needs identified, will sign off. Pt reports physically he feels at baseline with no acute OT needs. Will screen and sign off. Please re-consult if needs change. Thank you for this referral.  Binnie Kand M.S., OTR/L Pager: [phone]  12/31/2016, 9:54 AM

## 2016-12-31 NOTE — Consult Note (Addendum)
Admit date: 12/30/2016 Referring Physician  Dr. Daryll Drown Primary Physician No PCP Per Patient Primary Cardiologist  Dr. Claiborne Billings Reason for Consultation  chest pain  HPI: 51 year old male with horn artery artery disease status post ST elevation myocardial infarction in 2015, LAD stenosis DES,  cocaine use (urine toxicology positive for cocaine on this admission), prior ejection fraction 40-45% here with chest discomfort beginning last evening. This occurred at rest bilateral chest wall, felt dyspnea and may be some dizziness as well as diaphoresis. The pain feels like a pressure sensation. He was anxious with this. Ended up taking extra aspirin. Just could not get to sleep. Nitroglycerin seemed to help with pain but then the pain returned and he decided to go to the emergency room.  He's also battling an upper respiratory cold. He has not been on dual antiplatelet therapy for months. Has not followed up with Dr. Claiborne Billings.  His EKG shows sinus rhythm with poor R-wave progression, subtle persistent ST elevation in the precordial leads, left anterior fascicular block with nonspecific ST-T wave changes, relatively unchanged from 04/13/16.  Troponin multiple sets, normal.  He originally denied using cocaine. I explained that we sought in his urine and he said maybe I did a week ago.  Currently chest pain-free. No SOB.  PMH:   Past Medical History:  Diagnosis Date  . CAD in native artery 07/17/14   STEMI- LAD stenosis with DES  . Cocaine abuse   . ST elevation myocardial infarction (STEMI) involving left anterior descending (LAD) coronary artery with complication ([place])   . Tobacco abuse 07/17/2014    PSH:   Past Surgical History:  Procedure Laterality Date  . CORONARY ANGIOPLASTY WITH STENT PLACEMENT  07/18/14   resolute DES to LAD STEMI  . LEFT HEART CATH N/A 07/17/2014   Procedure: LEFT HEART CATH;  Surgeon: [name], MD;  [facility] C[facility] LAB;  Service: Cardiovascular;  Laterality: N/A;    . PERCUTANEOUS CORONARY STENT INTERVENTION (PCI-S)  07/17/2014   Procedure: PERCUTANEOUS CORONARY STENT INTERVENTION (PCI-S);  Surgeon: [name], MD;  Location: [facility] CATH LAB;  Service: Cardiovascular;;   Allergies:  Patient has no known allergies. Prior to Admit Meds:   Prior to Admission medications   Medication Sig Start Date End Date Taking? Authorizing Provider  aspirin 325 MG tablet Take 325 mg by mouth every 6 (six) hours as needed for mild pain.   Yes Historical Provider, MD  Multiple Vitamin (MULTIVITAMIN) tablet Take 1 tablet by mouth daily.   Yes Historical Provider, MD  nitroGLYCERIN (NITROSTAT) 0.4 MG SL tablet Place 1 tablet (0.4 mg total) under the tongue every 5 (five) minutes as needed for chest pain. 07/20/14  Yes Rhonda G Barrett, PA-C  Pseudoeph-CPM-DM-APAP (NIGHTTIME COLD/COUGH PO) Take 1 capsule by mouth at bedtime as needed (cold).   Yes Historical Provider, MD  ticagrelor (BRILINTA) 90 MG TABS tablet Take 90 mg by mouth 2 (two) times daily.   Yes Historical Provider, MD  predniSONE (DELTASONE) 20 MG tablet Take 3 tablets (60 mg total) by mouth daily. Patient not taking: Reported on 12/30/2016 09/01/16   Everlene Balls, MD   Fam HX:    Family History  Problem Relation Age of Onset  . Heart attack Mother   . Heart attack Sister   . Heart attack Brother    Social HX:    Social History   Social History  . Marital status: Single    Spouse name: N/A  . Number of children:  N/A  . Years of education: N/A   Occupational History  . Landscaping    Social History Main Topics  . Smoking status: Former Smoker    Packs/day: 0.50    Years: 30.00    Types: Cigarettes    Quit date: 07/17/2014  . Smokeless tobacco: Never Used  . Alcohol use No     Comment: Patient denies abuse, states social drinker  . Drug use: No     Comment: heroin  . Sexual activity: Not on file   Other Topics Concern  . Not on file   Social History Narrative   Lives in [place] with his  sister and girlfriend. He works Aeronautical engineer work.      ROS: No syncope, no bleeding, no orthopnea, no PND All 11 ROS were addressed and are negative except what is stated in the HPI   Physical Exam: Blood pressure (!) 123/94, pulse 98, temperature 98 F (36.7 C), temperature source Oral, resp. rate 18, height 5' 10.5" (1.791 m), weight 166 lb 8 oz (75.5 kg), SpO2 96 %.   General: Well developed, well nourished, in no acute distress Head: Eyes PERRLA, No xanthomas.   Normal cephalic and atramatic  Lungs:   Clear bilaterally to auscultation and percussion. Normal respiratory effort. No wheezes, no rales. Heart:   HRRR S1 S2 Pulses are 2+ & equal. 2/6 HSM apex, no rubs, gallops.  No carotid bruit. No JVD.  No abdominal bruits.  Abdomen: Bowel sounds are positive, abdomen soft and non-tender without masses. No hepatosplenomegaly. Msk:  Back normal. Normal strength and tone for age. Extremities:  No clubbing, cyanosis or edema.  DP +1 Neuro: Alert and oriented X 3, non-focal, MAE x 4 GU: Deferred Rectal: Deferred Psych:  Good affect, responds appropriately      Labs: Lab Results  Component Value Date   WBC 5.9 12/31/2016   HGB 14.0 12/31/2016   HCT 41.4 12/31/2016   MCV 86.3 12/31/2016   PLT 279 12/31/2016     Recent Labs Lab 12/31/16 0321  NA 139  K 4.1  CL 106  CO2 26  BUN 18  CREATININE 1.34*  CALCIUM 8.8*  PROT 6.0*  BILITOT 1.1  ALKPHOS 73  ALT 17  AST 17  GLUCOSE 113*    Recent Labs  12/30/16 1825 12/30/16 2310  TROPONINI <0.03 <0.03   Lab Results  Component Value Date   CHOL 209 (H) 07/18/2014   HDL 85 07/18/2014   LDLCALC 109 (H) 07/18/2014   TRIG 77 07/18/2014   Lab Results  Component Value Date   DDIMER <0.27 12/30/2016     Radiology:  Dg Chest 2 View  Result Date: 12/30/2016 CLINICAL DATA:  Chest pain and dyspnea tonight. EXAM: CHEST  2 VIEW COMPARISON:  04/13/2016 FINDINGS: The heart size and mediastinal contours are within  normal limits. Both lungs are clear. The visualized skeletal structures are unremarkable. IMPRESSION: No active cardiopulmonary disease. Electronically Signed   By: Andreas Newport M.D.   On: 12/30/2016 05:51   Personally viewed.  EKG:  sinus rhythm with poor R-wave progression, subtle persistent ST elevation in the precordial leads, left anterior fascicular block with nonspecific ST-T wave changes, relatively unchanged from 04/13/16.   Personally viewed.   Echocardiogram 07/18/14:  - Left ventricle: The cavity size was normal. Systolic function was mildly to moderately reduced. The estimated ejection fraction was in the range of 40% to 45%. There is akinesis of the mid-apical anteroseptal, distal septal, distal lateral myocardium.  There is akinesis of the apical myocardium. - Mitral valve: There was mild regurgitation. - Tricuspid valve: There was mild regurgitation.  Cardiac catheterization 07/18/14:  Acute ST segment elevation anterior wall myocardial infarction secondary to proximal-mid LAD occlusion.  Normal ramus intermediate vessel, dominant, left circumflex coronary artery, and nondominant right coronary arteries.  Mild/moderate acute left ventricular dysfunction with hypokinesis of the mid distal anterolateral wall and significant hypo-kinesis involving the apex and distal inferoapical segment.  Ejection fraction approximately 45%.  Successful PCI of the totally occluded LAD with PTCA/DES stenting with a resolute 3.5x34 mm stent, postdilated to 3.9 mm, tapering to 3.7, 8 mm.  [name], MD, [facility]  ASSESSMENT/PLAN:    51 year old male with cocaine use, chest pain, coronary artery disease with prior ST elevation myocardial infarction in 2015 with DES to proximal-mid LAD with ejection fraction approximate 45% previously.  Cocaine induced chest pain  - EKG relatively unchanged from previous with no significant increase in ST elevation.  - Prior ejection fraction  45%, moderately reduced, however current echocardiogram demonstrates 35%.  - Troponins have been normal  - Agree with use of nitrates, trying to avoid beta blocker use since he is still using cocaine  - Amlodipine/isosorbide usually helpful for vasodilatory effects as well as blood pressure effects.  - Aspirin. Should be on statin.  - Since he is greater than one year out of stent placement, no need for Brilinta.  - He understands that continued use of cocaine especially with his prior history of ST elevation myocardial infarction will greatly increase his chance of morbidity/mortality.  Chronic systolic heart failure  - EF is reduced to 35% from 45%. This certainly can be related to ongoing cocaine use and perhaps uncontrolled hypertension. His anteroseptal wall is akinetic from prior heart attack.  - Normally, we would prescribe beta blockers, carvedilol for instance however I'm hesitant given his ongoing cocaine use.  - ACE inhibitor or angiotensin receptor blocker would be reasonable. I would recommend lisinopril 10 mg once a day to start.    Cocaine use  - As above. We discussed the mortality surrounding this.  Mitral regurgitation  - Likely a result of dilated left ventricle with decreased ejection fraction.  - He does not appear volume overloaded nor is he short of breath currently, no crackles on exam.  Candee Furbish, MD  12/31/2016  8:39 AM

## 2017-01-01 MED FILL — ROSUVASTATIN CALCIUM 20 MG: 20 | 30 days supply | Qty: 30 | Fill #0

## 2017-01-01 MED FILL — LISINOPRIL 5 MG TABLET: 5 | 30 days supply | Qty: 30 | Fill #0

## 2017-01-01 MED FILL — NITROGLYCERIN 0.4 MG TAB SL: 0.4 | 13 days supply | Qty: 25 | Fill #0

## 2017-01-04 DIAGNOSIS — E785 Hyperlipidemia, unspecified: Secondary | ICD-10-CM | POA: Insufficient documentation

## 2017-01-04 DIAGNOSIS — N179 Acute kidney failure, unspecified: Secondary | ICD-10-CM

## 2017-01-04 DIAGNOSIS — J069 Acute upper respiratory infection, unspecified: Secondary | ICD-10-CM

## 2017-01-04 DIAGNOSIS — I5042 Chronic combined systolic (congestive) and diastolic (congestive) heart failure: Secondary | ICD-10-CM

## 2017-01-14 ENCOUNTER — Ambulatory Visit: Payer: Self-pay

## 2017-01-15 ENCOUNTER — Telehealth: Payer: Self-pay | Admitting: General Practice

## 2017-01-15 NOTE — Telephone Encounter (Signed)
APT. REMINDER CALL, LMTCB °

## 2017-01-18 ENCOUNTER — Ambulatory Visit (INDEPENDENT_AMBULATORY_CARE_PROVIDER_SITE_OTHER): Payer: Self-pay | Admitting: Internal Medicine

## 2017-01-18 VITALS — BP 128/89 | HR 95 | Temp 98.0°F | Ht 70.5 in | Wt 179.0 lb

## 2017-01-18 DIAGNOSIS — Z09 Encounter for follow-up examination after completed treatment for conditions other than malignant neoplasm: Secondary | ICD-10-CM

## 2017-01-18 DIAGNOSIS — F1490 Cocaine use, unspecified, uncomplicated: Secondary | ICD-10-CM

## 2017-01-18 DIAGNOSIS — I5042 Chronic combined systolic (congestive) and diastolic (congestive) heart failure: Secondary | ICD-10-CM

## 2017-01-18 DIAGNOSIS — N179 Acute kidney failure, unspecified: Secondary | ICD-10-CM

## 2017-01-18 DIAGNOSIS — F17210 Nicotine dependence, cigarettes, uncomplicated: Secondary | ICD-10-CM

## 2017-01-18 DIAGNOSIS — G4700 Insomnia, unspecified: Secondary | ICD-10-CM

## 2017-01-18 DIAGNOSIS — I2510 Atherosclerotic heart disease of native coronary artery without angina pectoris: Secondary | ICD-10-CM

## 2017-01-18 DIAGNOSIS — Z9861 Coronary angioplasty status: Secondary | ICD-10-CM

## 2017-01-18 DIAGNOSIS — F1421 Cocaine dependence, in remission: Secondary | ICD-10-CM

## 2017-01-18 DIAGNOSIS — Z955 Presence of coronary angioplasty implant and graft: Secondary | ICD-10-CM

## 2017-01-18 DIAGNOSIS — Z79899 Other long term (current) drug therapy: Secondary | ICD-10-CM

## 2017-01-18 DIAGNOSIS — Z8679 Personal history of other diseases of the circulatory system: Secondary | ICD-10-CM

## 2017-01-18 DIAGNOSIS — I252 Old myocardial infarction: Secondary | ICD-10-CM

## 2017-01-18 MED ORDER — LISINOPRIL 10 MG PO TABS: 10.0000 mg | ORAL_TABLET | Freq: Every day | ORAL | 1 refills | Status: DC

## 2017-01-18 NOTE — Progress Notes (Signed)
Internal Medicine Clinic Attending  Case discussed with Dr. Rice at the time of the visit.  We reviewed the resident's history and exam and pertinent patient test results.  I agree with the assessment, diagnosis, and plan of care documented in the resident's note.  

## 2017-01-18 NOTE — Progress Notes (Signed)
   CC: Hospital follow up for chest pain  HPI:  [name] is a 51 y.o. man with a history of HFrEF secondary to cocaine use and CAD s/p STEMI in 2015 with PCI to LAD presenting for a two week follow up after hospitalization for chest pain. His chest pain resolved at the hospital without evidence of new acute coronary syndrome, but repeat TTE showed progression of his heart failure with LVEF now 30-50%. Since discharge he has not had a recurrence of this chest pain. He has had some heartburn when eating large meals that was relieved with water or ginger ale. He has had some palpitations but these are isolated and happening only a few times per day. He reports not using cocaine again since the week before his hospitalization. He has continued to have fatigue and low energy. He does not become dyspneic with mild to moderate such as walking long distances on nearly level ground.  He has some disordered sleeping with frequent awakening every 2-3 hours. He is sleepy during the day and takes naps which is not normal for him. He gets out of bed to urinate approximately once per night. He previously tried taking cough medicine at night to help sleep but stopped doing so at the recommendation of his cardiologist. He has not tried anything else for sleep.   See problem based assessment and plan below for additional details  Past Medical History:  Diagnosis Date  . CAD in native artery 07/17/14   STEMI- LAD stenosis with DES  . Cocaine abuse   . Dyspnea   . Hypertension   . ST elevation myocardial infarction (STEMI) involving left anterior descending (LAD) coronary artery with complication ([place])   . Tobacco abuse 07/17/2014    Review of Systems:  Review of Systems  Constitutional: Positive for malaise/fatigue.  HENT: Negative for tinnitus.   Eyes: Negative for blurred vision.  Respiratory: Positive for shortness of breath.   Cardiovascular: Positive for palpitations. Negative for leg swelling.    Gastrointestinal: Positive for heartburn.  Genitourinary: Negative for frequency.  Skin: Negative for rash.  Neurological: Negative for dizziness.  Psychiatric/Behavioral: The patient has insomnia.     Physical Exam: Physical Exam  Constitutional: He is oriented to person, place, and time and well-developed, well-nourished, and in no distress.  HENT:  Head: Normocephalic and atraumatic.  Eyes: Conjunctivae are normal.  Neck: Normal range of motion. Neck supple. No JVD present.  Cardiovascular:  Regular rate and rhythm, early systolic murmur audible over LUSB  Pulmonary/Chest: Effort normal and breath sounds normal. He has no wheezes.  Abdominal: Soft. There is no tenderness.  Musculoskeletal: He exhibits no edema.  Neurological: He is alert and oriented to person, place, and time. Gait normal.  Skin: Skin is warm and dry. No rash noted.  Psychiatric: Affect normal.    Vitals:   01/18/17 0817  BP: 128/89  Pulse: 95  Temp: 98 F (36.7 C)  TempSrc: Oral  SpO2: 98%  Weight: 179 lb (81.2 kg)  Height: 5' 10.5" (1.791 m)    Assessment & Plan:   See Encounters Tab for problem based charting.  Patient discussed with Dr. Dareen Piano

## 2017-01-18 NOTE — Assessment & Plan Note (Signed)
The cause of his insomnia is not readily apparent to a review today. He is having fragmented sleep due to multiple awakenings without any real problem of sleep latency. He does admit to nocturia about once per night but this does not seem to be the underlying cause. He reads, uses his phone in bed, and has been napping in the day which are poor sleep hygiene and might be contributing. He denies awakening out of breath or with headaches, has normal blood pressure, and he has a pretty thin body habitus so obstructive sleep apnea is less likely. He denies having much anxiety or restlessness distracting him from sleeping.   Asked him to try reducing naps Recommended trial of melatonin for sleep aid

## 2017-01-18 NOTE — Assessment & Plan Note (Signed)
He has previously had a normal baseline renal function with SCr 0.9-1.1 increased at 1.34 prior to discharge from the hospital 2 weeks ago. He was also started on lisinopril at that time.  -check Bmet today

## 2017-01-18 NOTE — Assessment & Plan Note (Signed)
He has an extensive family history as well as risk factors of smoking, hyperlipidemia, and cocaine use. He is not having continued chest pain since his hospitalization.  He will need indefinite ASA and high intensity statin unless these become contraindicated.

## 2017-01-18 NOTE — Assessment & Plan Note (Addendum)
Blood pressure is adequately controlled today at 128/89 but we can continue to treat aggressively unless he becomes symptomatic. He has fairly good functional status tolerating mild to moderate exertion although daily fatigue is worse possibly due to this. He is now on ASA, ACE-I, and a statin. We can discuss adding a low dose of Coreg at his next visit assuming he is tolerating the regimen well.  Extensive workups, medications, or frequent visits are problematic for him due to lack of money or insurance so he will need to meet with Bonna Gains about the [employer] since this problem is going to require regular care from [place] and Cardiology.  -Increase lisinopril to 10mg  daily -RTC in about a month -Consider starting a beta blocker at next office visit

## 2017-01-18 NOTE — Patient Instructions (Addendum)
It was a pleasure to meet you today Tyler Wong.  Your blood pressure is doing well today but I think we should treat it as aggressively as we can unless you develop symptoms such as lightheadedness. Based on this I recommend increasing lisinopril from 5mg  to 10mg  daily.  The most important change you can make to avoid worsening your heart function is to avoid any additional cocaine use. This drug puts a very high strain on the heart and can even cause another heart attack. There is no reliable safe dose of exposure so avoiding this is the only safe plan.  We are checking a blood test today to make sure your kidney function has returned to normal after this hospitalization and is tolerating the medication without problems.  For your sleep difficulty I recommend trying melatonin as a sleep aid if you are still having difficulties. If this problem persists we may need to consider alternative factors such as frequent episodes of waking up to urinate.

## 2017-01-18 NOTE — Assessment & Plan Note (Signed)
He reports abstinence from cocaine use since the week before his hospitalization (last week of December 2017). This is a major risk factor for precipitating additional ischemic injury. I counseled him for several minutes about how cocaine can contribute to worsening his heart failure or even causing another heart attack. He seemed to have fair understanding and agreed to try his best at maintenance at this time.

## 2017-01-19 LAB — BMP8+ANION GAP
Anion Gap: 15 mmol/L (ref 10.0–18.0)
BUN/Creatinine Ratio: 19 (ref 9–20)
BUN: 23 mg/dL (ref 6–24)
CO2: 25 mmol/L (ref 18–29)
Calcium: 9.4 mg/dL (ref 8.7–10.2)
Chloride: 100 mmol/L (ref 96–106)
Creatinine, Ser: 1.21 mg/dL (ref 0.76–1.27)
GFR calc Af Amer: 80 mL/min/{1.73_m2} (ref >59)
GFR calc non Af Amer: 69 mL/min/{1.73_m2} (ref >59)
Glucose: 95 mg/dL (ref 65–99)
Potassium: 4.8 mmol/L (ref 3.5–5.2)
Sodium: 140 mmol/L (ref 134–144)

## 2017-01-20 ENCOUNTER — Telehealth: Payer: Self-pay | Admitting: Internal Medicine

## 2017-01-20 NOTE — Telephone Encounter (Signed)
CALLED PT, LMTCB, NEEDS TO DO APP FOR GCCN CARD

## 2017-01-24 ENCOUNTER — Emergency Department (HOSPITAL_COMMUNITY): Payer: Self-pay

## 2017-01-24 ENCOUNTER — Encounter: Payer: Self-pay | Admitting: Internal Medicine

## 2017-01-24 ENCOUNTER — Encounter (HOSPITAL_COMMUNITY): Payer: Self-pay | Admitting: Emergency Medicine

## 2017-01-24 ENCOUNTER — Inpatient Hospital Stay (HOSPITAL_COMMUNITY)
Admission: EM | Admit: 2017-01-24 | Discharge: 2017-01-26 | DRG: 291 | Disposition: A | Discharge status: Home / Self Care | Payer: Self-pay | Attending: Internal Medicine | Admitting: Internal Medicine

## 2017-01-24 DIAGNOSIS — T502X5A Adverse effect of carbonic-anhydrase inhibitors, benzothiadiazides and other diuretics, initial encounter: Secondary | ICD-10-CM | POA: Diagnosis not present

## 2017-01-24 DIAGNOSIS — R0789 Other chest pain: Secondary | ICD-10-CM

## 2017-01-24 DIAGNOSIS — F1410 Cocaine abuse, uncomplicated: Secondary | ICD-10-CM | POA: Diagnosis present

## 2017-01-24 DIAGNOSIS — F17210 Nicotine dependence, cigarettes, uncomplicated: Secondary | ICD-10-CM | POA: Diagnosis present

## 2017-01-24 DIAGNOSIS — R079 Chest pain, unspecified: Secondary | ICD-10-CM | POA: Diagnosis present

## 2017-01-24 DIAGNOSIS — Z8249 Family history of ischemic heart disease and other diseases of the circulatory system: Secondary | ICD-10-CM

## 2017-01-24 DIAGNOSIS — R109 Unspecified abdominal pain: Secondary | ICD-10-CM

## 2017-01-24 DIAGNOSIS — Z9861 Coronary angioplasty status: Secondary | ICD-10-CM

## 2017-01-24 DIAGNOSIS — Z7982 Long term (current) use of aspirin: Secondary | ICD-10-CM

## 2017-01-24 DIAGNOSIS — Z955 Presence of coronary angioplasty implant and graft: Secondary | ICD-10-CM

## 2017-01-24 DIAGNOSIS — F1490 Cocaine use, unspecified, uncomplicated: Secondary | ICD-10-CM | POA: Diagnosis present

## 2017-01-24 DIAGNOSIS — Z79899 Other long term (current) drug therapy: Secondary | ICD-10-CM

## 2017-01-24 DIAGNOSIS — I252 Old myocardial infarction: Secondary | ICD-10-CM

## 2017-01-24 DIAGNOSIS — Y95 Nosocomial condition: Secondary | ICD-10-CM | POA: Diagnosis present

## 2017-01-24 DIAGNOSIS — J101 Influenza due to other identified influenza virus with other respiratory manifestations: Secondary | ICD-10-CM | POA: Diagnosis present

## 2017-01-24 DIAGNOSIS — I110 Hypertensive heart disease with heart failure: Principal | ICD-10-CM | POA: Diagnosis present

## 2017-01-24 DIAGNOSIS — I509 Heart failure, unspecified: Secondary | ICD-10-CM

## 2017-01-24 DIAGNOSIS — I5023 Acute on chronic systolic (congestive) heart failure: Secondary | ICD-10-CM

## 2017-01-24 DIAGNOSIS — R0600 Dyspnea, unspecified: Secondary | ICD-10-CM

## 2017-01-24 DIAGNOSIS — J189 Pneumonia, unspecified organism: Secondary | ICD-10-CM | POA: Diagnosis present

## 2017-01-24 DIAGNOSIS — I2510 Atherosclerotic heart disease of native coronary artery without angina pectoris: Secondary | ICD-10-CM | POA: Diagnosis present

## 2017-01-24 DIAGNOSIS — I5043 Acute on chronic combined systolic (congestive) and diastolic (congestive) heart failure: Secondary | ICD-10-CM | POA: Diagnosis present

## 2017-01-24 DIAGNOSIS — E785 Hyperlipidemia, unspecified: Secondary | ICD-10-CM | POA: Diagnosis present

## 2017-01-24 DIAGNOSIS — J1000 Influenza due to other identified influenza virus with unspecified type of pneumonia: Secondary | ICD-10-CM | POA: Diagnosis present

## 2017-01-24 DIAGNOSIS — N179 Acute kidney failure, unspecified: Secondary | ICD-10-CM | POA: Diagnosis not present

## 2017-01-24 DIAGNOSIS — I255 Ischemic cardiomyopathy: Secondary | ICD-10-CM | POA: Diagnosis present

## 2017-01-24 LAB — TROPONIN I: Troponin I: <0.03 ng/mL (ref <0.03)

## 2017-01-24 LAB — BASIC METABOLIC PANEL
Anion gap: 7 (ref 5–15)
BUN: 15 mg/dL (ref 6–20)
CO2: 25 mmol/L (ref 22–32)
Calcium: 9 mg/dL (ref 8.9–10.3)
Chloride: 105 mmol/L (ref 101–111)
Creatinine, Ser: 1.17 mg/dL (ref 0.61–1.24)
GFR calc Af Amer: >60 mL/min (ref >60)
GFR calc non Af Amer: >60 mL/min (ref >60)
Glucose, Bld: 132 mg/dL — ABNORMAL HIGH (ref 65–99)
Potassium: 4.3 mmol/L (ref 3.5–5.1)
Sodium: 137 mmol/L (ref 135–145)

## 2017-01-24 LAB — URINALYSIS, ROUTINE W REFLEX MICROSCOPIC
Bilirubin Urine: NEGATIVE
Glucose, UA: NEGATIVE mg/dL
Hgb urine dipstick: NEGATIVE
Ketones, ur: NEGATIVE mg/dL
Leukocytes, UA: NEGATIVE
Nitrite: NEGATIVE
Protein, ur: NEGATIVE mg/dL
Specific Gravity, Urine: 1.012 (ref 1.005–1.030)
pH: 8 (ref 5.0–8.0)

## 2017-01-24 LAB — I-STAT TROPONIN, ED: Troponin i, poc: 0.01 ng/mL (ref 0.00–0.08)

## 2017-01-24 LAB — BRAIN NATRIURETIC PEPTIDE: B Natriuretic Peptide: 1743.5 pg/mL — ABNORMAL HIGH (ref 0.0–100.0)

## 2017-01-24 LAB — CBC
HCT: 41.7 % (ref 39.0–52.0)
Hemoglobin: 13.4 g/dL (ref 13.0–17.0)
MCH: 28.8 pg (ref 26.0–34.0)
MCHC: 32.1 g/dL (ref 30.0–36.0)
MCV: 89.5 fL (ref 78.0–100.0)
Platelets: 184 10*3/uL (ref 150–400)
RBC: 4.66 MIL/uL (ref 4.22–5.81)
RDW: 14.8 % (ref 11.5–15.5)
WBC: 6.4 10*3/uL (ref 4.0–10.5)

## 2017-01-24 LAB — RAPID URINE DRUG SCREEN, HOSP PERFORMED
Amphetamines: NOT DETECTED
Barbiturates: NOT DETECTED
Benzodiazepines: NOT DETECTED
Cocaine: POSITIVE — AB
Opiates: NOT DETECTED
Tetrahydrocannabinol: NOT DETECTED

## 2017-01-24 LAB — PROCALCITONIN: Procalcitonin: 7.9 ng/mL

## 2017-01-24 LAB — STREP PNEUMONIAE URINARY ANTIGEN: Strep Pneumo Urinary Antigen: NEGATIVE

## 2017-01-24 LAB — INFLUENZA PANEL BY PCR (TYPE A & B)
Influenza A By PCR: POSITIVE — AB
Influenza B By PCR: NEGATIVE

## 2017-01-24 LAB — LIPASE, BLOOD: Lipase: 20 U/L (ref 11–51)

## 2017-01-24 MED ORDER — ROSUVASTATIN CALCIUM 10 MG PO TABS
20.0000 mg | ORAL_TABLET | Freq: Every day | ORAL | Status: DC
Start: 2017-01-24 — End: 2017-01-26
  Administered 2017-01-24 – 2017-01-25 (×2): 20 mg via ORAL
  Filled 2017-01-24 (×2): qty 2
  Filled 2017-01-24: qty 1

## 2017-01-24 MED ORDER — ENOXAPARIN SODIUM 40 MG/0.4ML ~~LOC~~ SOLN
40.0000 mg | SUBCUTANEOUS | Status: DC
  Administered 2017-01-25 – 2017-01-26 (×2): 40 mg via SUBCUTANEOUS
  Filled 2017-01-24 (×2): qty 0.4

## 2017-01-24 MED ORDER — GI COCKTAIL ~~LOC~~
30.0000 mL | Freq: Once | ORAL | Status: AC
  Administered 2017-01-24: 30 mL via ORAL
  Filled 2017-01-24: qty 30

## 2017-01-24 MED ORDER — ASPIRIN EC 81 MG PO TBEC
81.0000 mg | DELAYED_RELEASE_TABLET | Freq: Every day | ORAL | Status: DC
  Administered 2017-01-24 – 2017-01-26 (×3): 81 mg via ORAL
  Filled 2017-01-24 (×3): qty 1

## 2017-01-24 MED ORDER — FUROSEMIDE 10 MG/ML IJ SOLN
20.0000 mg | Freq: Two times a day (BID) | INTRAMUSCULAR | Status: DC
  Administered 2017-01-24 – 2017-01-26 (×5): 20 mg via INTRAVENOUS
  Filled 2017-01-24 (×2): qty 2
  Filled 2017-01-24: qty 4
  Filled 2017-01-24 (×2): qty 2

## 2017-01-24 MED ORDER — AZITHROMYCIN 250 MG PO TABS
500.0000 mg | ORAL_TABLET | ORAL | Status: DC
  Administered 2017-01-25 – 2017-01-26 (×2): 500 mg via ORAL
  Filled 2017-01-24 (×2): qty 2

## 2017-01-24 MED ORDER — ACETAMINOPHEN 325 MG PO TABS
650.0000 mg | ORAL_TABLET | Freq: Four times a day (QID) | ORAL | Status: DC | PRN
  Administered 2017-01-24: 650 mg via ORAL
  Filled 2017-01-24: qty 2

## 2017-01-24 MED ORDER — GUAIFENESIN-DM 100-10 MG/5ML PO SYRP: 5.0000 mL | ORAL_SOLUTION | ORAL | Status: DC | PRN

## 2017-01-24 MED ORDER — GI COCKTAIL ~~LOC~~
30.0000 mL | Freq: Three times a day (TID) | ORAL | Status: DC | PRN
  Administered 2017-01-24: 30 mL via ORAL
  Filled 2017-01-24: qty 30

## 2017-01-24 MED ORDER — OSELTAMIVIR PHOSPHATE 75 MG PO CAPS
75.0000 mg | ORAL_CAPSULE | Freq: Two times a day (BID) | ORAL | Status: DC
  Administered 2017-01-24 – 2017-01-26 (×4): 75 mg via ORAL
  Filled 2017-01-24 (×5): qty 1

## 2017-01-24 MED ORDER — DEXTROSE 5 % IV SOLN
1.0000 g | Freq: Once | INTRAVENOUS | Status: AC
  Administered 2017-01-24: 1 g via INTRAVENOUS
  Filled 2017-01-24: qty 1

## 2017-01-24 MED ORDER — DEXTROSE 5 % IV SOLN
1.0000 g | INTRAVENOUS | Status: DC
  Administered 2017-01-25: 1 g via INTRAVENOUS
  Filled 2017-01-24 (×3): qty 10

## 2017-01-24 MED ORDER — ACETAMINOPHEN 650 MG RE SUPP: 650.0000 mg | Freq: Four times a day (QID) | RECTAL | Status: DC | PRN

## 2017-01-24 MED ORDER — VANCOMYCIN HCL IN DEXTROSE 1-5 GM/200ML-% IV SOLN: 1000.0000 mg | Freq: Two times a day (BID) | INTRAVENOUS | Status: DC

## 2017-01-24 MED ORDER — SODIUM CHLORIDE 0.9% FLUSH
3.0000 mL | Freq: Two times a day (BID) | INTRAVENOUS | Status: DC
  Administered 2017-01-24 – 2017-01-26 (×5): 3 mL via INTRAVENOUS

## 2017-01-24 MED ORDER — LISINOPRIL 10 MG PO TABS
10.0000 mg | ORAL_TABLET | Freq: Every day | ORAL | Status: DC
  Administered 2017-01-24 – 2017-01-26 (×3): 10 mg via ORAL
  Filled 2017-01-24 (×3): qty 1

## 2017-01-24 MED ORDER — ADULT MULTIVITAMIN W/MINERALS CH
1.0000 | ORAL_TABLET | Freq: Every day | ORAL | Status: DC
  Administered 2017-01-24 – 2017-01-26 (×3): 1 via ORAL
  Filled 2017-01-24 (×3): qty 1

## 2017-01-24 MED ORDER — VANCOMYCIN HCL 10 G IV SOLR
1500.0000 mg | Freq: Once | INTRAVENOUS | Status: DC
  Administered 2017-01-24: 1500 mg via INTRAVENOUS
  Filled 2017-01-24: qty 1500

## 2017-01-24 NOTE — ED Notes (Signed)
Patient up ambulatory without any difficulty or distress to the bathroom at this time

## 2017-01-24 NOTE — H&P (Signed)
Date: 01/24/2017               Patient Name:  Tyler Wong MRN: [id number]  DOB: June 06, 1966 Age / Sex: 51 y.o., male   PCP: Collier Salina, MD         Medical Service: Internal Medicine Teaching Service         Attending Physician: Dr. Sid Falcon, MD    First Contact: Dr. Wynetta Emery Pager: [phone]  Second Contact: Dr. Tiburcio Pea  Pager: [phone]       After Hours (After 5p/  First Contact Pager: [phone]  weekends / holidays): Second Contact Pager: [phone]   Chief Complaint: cough, difficulty breathing, and abdominal pain   History of Present Illness: [name] is a 51 y.o. male with a PMH of CAD (s/p DES to LAD for STEMI in 2015), HFrEF,  cocaine and tobacco abuse who presents with cough, difficulty breathing, and abdominal pain. These symptoms began suddenly at 7 pm yesterday evening while he was sitting on the couch watching TV. He started coughing and felt chest tightness, had a hard time breathing, and developed stomach pain. He feels as though his stomach is bloated and that is what is causing his pain and difficulty breathing. He had four bowel movements yesterday which worked to relive the sensation for a short while. His stomach pain is worse over his epigastrum but he felt relief after GI cocktail given in the ED. His dry cough began yesterday and he has felt relief with mucinex and over the counter cough syrup. He denies fever, chills, myalgia, sick contacts, or sore throat. He has had difficulty breathing when he lays down flat and sudden onset of shortness of breath in the middle of the night for months. He denies leg swelling or difficulty breathing with exertion.    Of note he was recently admit 1/3-12/31/2016 for cocaine induced chest pain.   Meds:  Current Meds  Medication Sig  . aspirin 81 MG EC tablet Take 1 tablet (81 mg total) by mouth daily.  Marland Kitchen guaiFENesin-dextromethorphan (ROBITUSSIN DM) 100-10 MG/5ML syrup Take 5 mLs by mouth every 4 (four) hours as  needed for cough.  Marland Kitchen lisinopril (PRINIVIL,ZESTRIL) 10 MG tablet Take 1 tablet (10 mg total) by mouth daily.  . Multiple Vitamin (MULTIVITAMIN) tablet Take 1 tablet by mouth daily.  . nitroGLYCERIN (NITROSTAT) 0.4 MG SL tablet Place 1 tablet (0.4 mg total) under the tongue every 5 (five) minutes as needed for chest pain.  . rosuvastatin (CRESTOR) 20 MG tablet Take 1 tablet (20 mg total) by mouth daily at 6 PM.     Allergies: Allergies as of 01/24/2017  . (No Known Allergies)   Past Medical History:  Diagnosis Date  . CAD in native artery 07/17/14   STEMI- LAD stenosis with DES  . Cocaine abuse   . Dyspnea   . Hypertension   . ST elevation myocardial infarction (STEMI) involving left anterior descending (LAD) coronary artery with complication ([place])   . Tobacco abuse 07/17/2014    Family History: Sister and brother- coronary artery disease   Social History:  Smokes 2 cigarettes per day but has smoked 1/2 PPD for the past 30 years. Reports that he rarely drinks alcohol. He uses cocaine but states that he has not used this since before his admission on 1/3. He is married with 3 children.   Review of Systems: A complete ROS was negative except as per HPI.   Physical Exam: Blood pressure Marland Kitchen)  152/108, pulse 97, temperature 97.8 F (36.6 C), temperature source Oral, resp. rate 22, SpO2 95 %. Physical Exam  Constitutional: He is oriented to person, place, and time. He appears well-developed and well-nourished. No distress.  Eyes: Conjunctivae are normal. Scleral icterus is present.  Cardiovascular: Normal rate and regular rhythm.   No murmur heard. Pulmonary/Chest: Effort normal and breath sounds normal. No respiratory distress. He has no wheezes. He has no rales.  Abdominal: Soft. Bowel sounds are normal. He exhibits no distension. There is tenderness.  Epigastric, left upper and lower quadrant tenderness  Neurological: He is alert and oriented to person, place, and time.  Skin: Skin  is warm and dry. He is not diaphoretic.  Psychiatric: He has a normal mood and affect. His behavior is normal.    EKG: Sinus tachycardia, left axis deviation, biatrial enlargement, anterior septal ST segment changes unchanged from prior EKG.   [id number] review of the chest xray reveals left lower lobe consolidation. Small right pleural effusion.   BMP - Na 137, K 4.3, CO2 25, BUN 15, Crt 1.17, Glucose 132 CBC - WBC 6.4, Hgb 13.4, plt 184 BNP - 1,743 Trop poc 0.01 Urinalysis - negative hemoglobin, protein, nitrites, and protein Urine drug screen- positive for cocaine  Assessment & Plan by Problem: Principal Problem:   Acute exacerbation of congestive heart failure (HCC) Active Problems:   Cocaine use   CAD S/P percutaneous coronary angioplasty   Chest pain   HCAP (healthcare-associated pneumonia)  Acute exacerbation of congestive heart failure  Chest pain 51 y.o. male with a PMH of CAD (s/p DES to LAD for STEMI in 2015), HFrEF, cocaine and tobacco abuse who presents with cough, difficulty breathing, and abdominal pain. He describes orthopnea and PND which have been ongoing for months and appears euvolemic with no peripheral edema, abdominal distension or pulmonary congestion on exam. Echo 12/31/2016 showed EF 30-35% and grade 1 diastolic dysfunction with peak pulmonary artery pressure 48 mmHg. His weight is 179 lbs today increased from 166 lbs at the time of discharge from hospitalization in early January. In the ED labs revealed BNP 1700 on admission with baseline 370s. He does have a hx of coronary artery disease and ACS could be a cause of acute worsening of CHF but he denies chest pain at this time and initial trop poc was 0.01. PE is on the differential list given his history of recent hospitalization, Wells score for PE = 1.5 which correlates with low risk. Will begin IV diuresis and monitor for improvement in his symptoms.  -ordered IV lasix 20 gm BID  -continue GI cocktail TID PRN    -admit to telemetry -daily weights, strict intake and output   Abnormal chest xray  He describes sudden onset of dry cough last night which could be related to worsening of CHF but chest xray shows a new infiltrate which could be related to underlying pneumonia. He received 1 dose of vancomycin and cefepime which will cover him for pneumonia. He is afebrile.  -follow up procalcitonin, will use that in the setting of his clinical picture to guide need for further antibiotic therapy -follow up Influenza PCR  -follow up morning repeat chest xray  -robitussin PRN   CAD s/p LAD DES placed in 2015  Denies chest pain at this time but did recently use cocaine and described a pleuritic chest tightness last night. Initial trop poc 0.01 and EKG shows anterio septal ST segment changes consistent with prior EKG and reassuring that this presentation  is less likely ACS. Is on ASA 81 mg qd at home.  - continue ASA, lisinopril 10 mg qd, and rosuvastatin   Cocaine use  -social work consult  -follow up HIV antibody screen    DVT Ppx lovenox  Code Status FULL   Dispo: Admit patient to Observation with expected length of stay less than 2 midnights.  Signed: Ledell Noss, MD 01/24/2017, 10:56 AM  Pager: [phone]

## 2017-01-24 NOTE — ED Triage Notes (Signed)
Pt c/o 4/10 left cp and lower abd pain since last night a 1900, having some chills, nausea and vomiting.

## 2017-01-24 NOTE — ED Notes (Signed)
Admitting MDs at bedside.

## 2017-01-24 NOTE — ED Provider Notes (Signed)
[facility] DEPT Provider Note   CSN: [id number] Arrival date & time: 01/24/17  0544     History   Chief Complaint Chief Complaint  Patient presents with  . Chest Pain  . Abdominal Pain    HPI Tyler Wong is a 51 y.o. male.  HPI Patient presents with chest pain and abdominal pain. Again last night. Pain is dull. He has been coughing. Has had some nausea and vomiting. States this both feels like his previous heart attack and does feel like there is something else going on. Previous cocaine associated chest pain and STEMI. States he last used a month ago or 2 weeks ago. Also has had dull upper abdominal pain. Also reported some lower bowel pain. No dysuria. Has had some nausea vomiting. No sick contacts. Chest pain is dull. It is somewhat diffusely was chest but worse in the mid chest.   Past Medical History:  Diagnosis Date  . CAD in native artery 07/17/14   STEMI- LAD stenosis with DES  . Cocaine abuse   . Dyspnea   . Hypertension   . ST elevation myocardial infarction (STEMI) involving left anterior descending (LAD) coronary artery with complication ([place])   . Tobacco abuse 07/17/2014    Patient Active Problem List   Diagnosis Date Noted  . Insomnia 01/18/2017  . Chronic combined systolic and diastolic congestive heart failure ([place]) 01/04/2017  . AKI (acute kidney injury) ([place]) 01/04/2017  . Hyperlipidemia 01/04/2017  . Chest pain 12/30/2016  . CAD S/P percutaneous coronary angioplasty 07/18/2014  . Tobacco abuse 07/17/2014  . Cocaine use 07/17/2014    Past Surgical History:  Procedure Laterality Date  . CORONARY ANGIOPLASTY WITH STENT PLACEMENT  07/18/14   resolute DES to LAD STEMI  . LEFT HEART CATH N/A 07/17/2014   Procedure: LEFT HEART CATH;  Surgeon: [name], MD;  Location: [facility] CATH LAB;  Service: Cardiovascular;  Laterality: N/A;  . PERCUTANEOUS CORONARY STENT INTERVENTION (PCI-S)  07/17/2014   Procedure: PERCUTANEOUS CORONARY STENT INTERVENTION (PCI-S);   Surgeon: [name], MD;  Location: [facility] CATH LAB;  Service: Cardiovascular;;       Home Medications    Prior to Admission medications   Medication Sig Start Date End Date Taking? Authorizing Provider  aspirin 81 MG EC tablet Take 1 tablet (81 mg total) by mouth daily. 01/01/17  Yes Asencion Partridge, MD  guaiFENesin-dextromethorphan [facility] DM) 100-10 MG/5ML syrup Take 5 mLs by mouth every 4 (four) hours as needed for cough.   Yes Historical Provider, MD  lisinopril (PRINIVIL,ZESTRIL) 10 MG tablet Take 1 tablet (10 mg total) by mouth daily. 01/18/17  Yes Collier Salina, MD  Multiple Vitamin (MULTIVITAMIN) tablet Take 1 tablet by mouth daily.   Yes Historical Provider, MD  nitroGLYCERIN (NITROSTAT) 0.4 MG SL tablet Place 1 tablet (0.4 mg total) under the tongue every 5 (five) minutes as needed for chest pain. 12/31/16  Yes Asencion Partridge, MD  rosuvastatin (CRESTOR) 20 MG tablet Take 1 tablet (20 mg total) by mouth daily at 6 PM. 12/31/16  Yes Asencion Partridge, MD    Family History Family History  Problem Relation Age of Onset  . Heart attack Mother   . Heart attack Sister   . Heart failure Sister   . Heart attack Brother     Social History Social History  Substance Use Topics  . Smoking status: Current Every Day Smoker    Packs/day: 0.50    Years: 30.00    Types: Cigarettes  .  Smokeless tobacco: Never Used     Comment: cutting back 2-3 per day  . Alcohol use No     Comment: Patient denies abuse, states social drinker     Allergies   Patient has no known allergies.   Review of Systems Review of Systems  Constitutional: Negative for appetite change and fever.  Eyes: Negative for visual disturbance.  Respiratory: Positive for cough and shortness of breath.   Gastrointestinal: Positive for abdominal pain, nausea and vomiting.  Genitourinary: Negative for dysuria.  Musculoskeletal: Negative for back pain.  Neurological: Negative for weakness.  Hematological: Negative for  adenopathy.  Psychiatric/Behavioral: Negative for confusion.     Physical Exam Updated Vital Signs BP (!) 146/110   Pulse 90   Temp 98.7 F (37.1 C) (Oral)   Resp 26   SpO2 96%   Physical Exam  Constitutional: He appears well-developed and well-nourished.  HENT:  Head: Normocephalic.  Eyes: EOM are normal.  Neck: Normal range of motion. JVD present.  Cardiovascular: Normal rate.   Pulmonary/Chest:  Mildly harsh breath sounds without focal rales or rhonchi.  Abdominal: Soft. There is no tenderness.  Musculoskeletal: He exhibits no tenderness.  Neurological: He is alert.  Skin: Skin is warm. Capillary refill takes less than 2 seconds.  Psychiatric: He has a normal mood and affect.     ED Treatments / Results  Labs (all labs ordered are listed, but only abnormal results are displayed) Labs Reviewed  BASIC METABOLIC PANEL - Abnormal; Notable for the following:       Result Value   Glucose, Bld 132 (*)    All other components within normal limits  RAPID URINE DRUG SCREEN, HOSP PERFORMED - Abnormal; Notable for the following:    Cocaine POSITIVE (*)    All other components within normal limits  BRAIN NATRIURETIC PEPTIDE - Abnormal; Notable for the following:    B Natriuretic Peptide 1,743.5 (*)    All other components within normal limits  CBC  LIPASE, BLOOD  URINALYSIS, ROUTINE W REFLEX MICROSCOPIC  I-STAT TROPOININ, ED    EKG  EKG Interpretation  Date/Time:  Sunday January 24 2017 05:51:36 EST Ventricular Rate:  103 PR Interval:  158 QRS Duration: 78 QT Interval:  344 QTC Calculation: 450 R Axis:   -57 Text Interpretation:  Sinus tachycardia Biatrial enlargement Left anterior fascicular block Anteroseptal infarct , age undetermined Abnormal ECG Confirmed by Alvino Chapel  MD, Gianni Mihalik [id number]) on 01/24/2017 7:02:01 AM       Radiology Dg Chest 2 View  Result Date: 01/24/2017 CLINICAL DATA:  Chest pain EXAM: CHEST  2 VIEW COMPARISON:  12/30/2016 FINDINGS:  Interval development of left lower lobe airspace disease best seen on the lateral view. Suspicious for pneumonia. Small right pleural effusion. Cardiac enlargement with vascular congestion. Negative for pulmonary edema. IMPRESSION: Left lower lobe infiltrate, possible pneumonia Cardiac enlargement with vascular congestion. Small right pleural effusion. Electronically Signed   By: Franchot Gallo M.D.   On: 01/24/2017 07:07    Procedures Procedures (including critical care time)  Medications Ordered in ED Medications  ceFEPIme (MAXIPIME) 1 g in dextrose 5 % 50 mL IVPB (not administered)  gi cocktail (Maalox,Lidocaine,Donnatal) (not administered)     Initial Impression / Assessment and Plan / ED Course  I have reviewed the triage vital signs and the nursing notes.  Pertinent labs & imaging results that were available during my care of the patient were reviewed by me and considered in my medical decision making (see chart for  details).      patient with  Chest pain. Has had cough. X-ray shows possible pneumonia. Also has cocaine abuse. Patient denies recent use but drug screen is positive. History of noncompliance with his medications. Also abdominal pain. Labs reassuring. Will admit to internal medicine residents,  Who are the primary care for him. BNP also elevated.  Final Clinical Impressions(s) / ED Diagnoses   Final diagnoses:  HCAP (healthcare-associated pneumonia)  Abdominal pain, unspecified abdominal location  Cocaine abuse    New Prescriptions New Prescriptions   No medications on file     Davonna Belling, MD 01/24/17 [phone]

## 2017-01-24 NOTE — ED Notes (Signed)
Pt ambulatory to restroom

## 2017-01-25 ENCOUNTER — Observation Stay (HOSPITAL_COMMUNITY): Payer: Self-pay

## 2017-01-25 DIAGNOSIS — Z955 Presence of coronary angioplasty implant and graft: Secondary | ICD-10-CM

## 2017-01-25 DIAGNOSIS — Z7982 Long term (current) use of aspirin: Secondary | ICD-10-CM

## 2017-01-25 DIAGNOSIS — J1100 Influenza due to unidentified influenza virus with unspecified type of pneumonia: Secondary | ICD-10-CM

## 2017-01-25 DIAGNOSIS — I252 Old myocardial infarction: Secondary | ICD-10-CM

## 2017-01-25 DIAGNOSIS — E785 Hyperlipidemia, unspecified: Secondary | ICD-10-CM

## 2017-01-25 DIAGNOSIS — Z79899 Other long term (current) drug therapy: Secondary | ICD-10-CM

## 2017-01-25 LAB — COMPREHENSIVE METABOLIC PANEL
ALT: 56 U/L (ref 17–63)
AST: 31 U/L (ref 15–41)
Albumin: 3.2 g/dL — ABNORMAL LOW (ref 3.5–5.0)
Alkaline Phosphatase: 77 U/L (ref 38–126)
Anion gap: 7 (ref 5–15)
BUN: 17 mg/dL (ref 6–20)
CO2: 28 mmol/L (ref 22–32)
Calcium: 8.8 mg/dL — ABNORMAL LOW (ref 8.9–10.3)
Chloride: 102 mmol/L (ref 101–111)
Creatinine, Ser: 1.27 mg/dL — ABNORMAL HIGH (ref 0.61–1.24)
GFR calc Af Amer: >60 mL/min (ref >60)
GFR calc non Af Amer: >60 mL/min (ref >60)
Glucose, Bld: 96 mg/dL (ref 65–99)
Potassium: 3.8 mmol/L (ref 3.5–5.1)
Sodium: 137 mmol/L (ref 135–145)
Total Bilirubin: 1.5 mg/dL — ABNORMAL HIGH (ref 0.3–1.2)
Total Protein: 6.3 g/dL — ABNORMAL LOW (ref 6.5–8.1)

## 2017-01-25 LAB — CBC
HCT: 44.3 % (ref 39.0–52.0)
Hemoglobin: 14.9 g/dL (ref 13.0–17.0)
MCH: 29.3 pg (ref 26.0–34.0)
MCHC: 33.6 g/dL (ref 30.0–36.0)
MCV: 87.2 fL (ref 78.0–100.0)
Platelets: 206 10*3/uL (ref 150–400)
RBC: 5.08 MIL/uL (ref 4.22–5.81)
RDW: 14.2 % (ref 11.5–15.5)
WBC: 6.5 10*3/uL (ref 4.0–10.5)

## 2017-01-25 LAB — HIV ANTIBODY (ROUTINE TESTING W REFLEX): HIV Screen 4th Generation wRfx: NONREACTIVE

## 2017-01-25 LAB — TROPONIN I
Troponin I: <0.03 ng/mL (ref <0.03)
Troponin I: <0.03 ng/mL (ref <0.03)

## 2017-01-25 MED ORDER — CARVEDILOL 3.125 MG PO TABS
3.1250 mg | ORAL_TABLET | Freq: Two times a day (BID) | ORAL | Status: DC
  Administered 2017-01-26: 3.125 mg via ORAL
  Filled 2017-01-25: qty 1

## 2017-01-25 NOTE — Progress Notes (Signed)
Subjective: Tyler Wong reports feeling significantly better today, continues to have a mild dry cough. He endorses a history of orthopnea for several weeks/months and was interested in learning about CHF and diuretics. We discussed the need to remain vigilant over salt and fluid intake (particularly hidden salt in pre-packaged foods or restaurant meals) and keeping track of daily weights.    Discussed our concerns over his ongoing cocaine use and adverse effects on his cardiac health. He states that it is something he has continued to struggle with but understands the need for cessation.  Net -2.2 L, 7 lbs down however moved floors  Objective: Vital signs in last 24 hours: Vitals:   01/24/17 1545 01/24/17 2030 01/25/17 0008 01/25/17 0406  BP: (!) 144/110 111/79 100/86 117/89  Pulse: (!) 102 98 91 89  Resp:      Temp: 99 F (37.2 C) 100 F (37.8 C)  98.1 F (36.7 C)  TempSrc: Oral Oral  Oral  SpO2: 97% 93% 93% 97%  Weight:    172 lb 11.2 oz (78.3 kg)  Height: 5' 10.5" (1.791 m)       Intake/Output Summary (Last 24 hours) at 01/25/17 0753 Last data filed at 01/24/17 2200  Gross per 24 hour  Intake             1220 ml  Output             3460 ml  Net            -2240 ml   Filed Weights   01/25/17 0406  Weight: 172 lb 11.2 oz (78.3 kg)   Physical Exam General appearance: Well-developed thin gentleman resting comfortably in bed, in no distress, breathing comfortably HENT: Normocephalic, atraumatic, moist mucous membranes Cardiovascular: Regular rate and rhythm, no murmurs, rubs, gallops Respiratory/Chest: Clear to ausculation bilaterally, normal work of breathing Abdomen: Soft, non-tender, non-distended Skin: Warm, dry, intact Psych: Normal affect  Labs / Imaging / Procedures: CBC Latest Ref Rng & Units 01/25/2017 01/24/2017 12/31/2016  WBC 4.0 - 10.5 K/uL 6.5 6.4 5.9  Hemoglobin 13.0 - 17.0 g/dL 14.9 13.4 14.0  Hematocrit 39.0 - 52.0 % 44.3 41.7 41.4  Platelets 150 - 400  K/uL 206 184 279   BMP Latest Ref Rng & Units 01/24/2017 01/18/2017 12/31/2016  Glucose 65 - 99 mg/dL 132(H) 95 113(H)  BUN 6 - 20 mg/dL 15 23 18   Creatinine 0.61 - 1.24 mg/dL 1.17 1.21 1.34(H)  BUN/Creat Ratio 9 - 20 - 19 -  Sodium 135 - 145 mmol/L 137 140 139  Potassium 3.5 - 5.1 mmol/L 4.3 4.8 4.1  Chloride 101 - 111 mmol/L 105 100 106  CO2 22 - 32 mmol/L 25 25 26   Calcium 8.9 - 10.3 mg/dL 9.0 9.4 8.8(L)   Dg Chest 2 View  Result Date: 01/25/2017 CLINICAL DATA:  Shortness of breath. EXAM: CHEST  2 VIEW COMPARISON:  01/24/2017. FINDINGS: Mediastinum hilar structures normal. Cardiomegaly with stable mild pulmonary venous congestion. Mild bibasilar subsegmental atelectasis. Small right pleural effusion. No pneumothorax. IMPRESSION: 1. Stable cardiomegaly with mild pulmonary venous congestion. Small right pleural effusion again noted without significant change. 2. Mild bibasilar atelectasis. No evidence of progressive infiltrate. Electronically Signed   By: Marcello Moores  Register   On: 01/25/2017 07:32   Assessment/Plan:  Principal Problem:   Acute exacerbation of congestive heart failure ([place]) Active Problems:   Cocaine use   CAD S/P percutaneous coronary angioplasty   Chest pain   HCAP (healthcare-associated pneumonia)  CHF exacerbation (HCC)   Influenza A  Acute on chronic combined CHF, LVEF 30-35% and G1DD in 12/2016, ICM with CAD, recent admit for cocaine associated chest pain, ongoing cocaine abuse. Minimal evidence of CHF on physical exam but reports significant orthopnea. BNP 1700 on admit, pulm vasc congestion on CXR, and weight 13 lbs up from baseline 166 lbs - Lasix 20 mg IV BID, change to po tomorrow - Strict I/Os, daily weights - Telemetry - Continue Lisinopril 10 mg  - Will consider starting low-dose Coreg tomorrow - Will reinforce need for daily weights and rx PRN Lasix on discharge  Influenza A, several days of URI symptoms and malaise - Tamiflu 75 mg for 5 days -  Supportive care, encourage hydration, robitussin PRN  LLL infiltrate, seen on initial CXR, largely absent on repeat 1 day later. Procalcitonin elevated at 7.9, received 1 dose of vanc/cef in ED, started on Ctx/Azithromycin on 1/28 for empiric cap coverage. Patient with dry cough and influenza A at risk for superimposed bacterial pneumonia however infiltrate resolution with diuresis within 24 hours makes infectious etiology questionable. Afebrile and no leukocytosis. -- Continue empiric CTX/Azithromycin for now -- Trend CBC -- Follow repeat PCT  Cocaine abuse, UDS positive for cocaine, has been positive since 2015 -- Discussed the severity of this habit on his cardiac health and the need for cessation and he voiced understanding  Abdominal pain, likely related to cough or nonspecific gastritis, resolved with GI cocktail - GI cocktail TID PRN  CAD, s/p LAD DES placed in 2015, no chest pain or signif EKG changes this admission, serial troponins negative - continue Asa 81 mg  HLD - continue home Rosuvastatin  Diet: HH diet DVT ppx: Lovenox  Dispo: Anticipated discharge in approximately 1 day(s).   LOS: 1 day   Asencion Partridge, MD 01/25/2017, 7:53 AM Pager: [phone]

## 2017-01-25 NOTE — Progress Notes (Signed)
Report received in patient's room via mary RN using SBAR format, reviewed VS, labs, tests and patient's general condition, assumed care of patient.

## 2017-01-26 ENCOUNTER — Telehealth: Payer: Self-pay

## 2017-01-26 DIAGNOSIS — N179 Acute kidney failure, unspecified: Secondary | ICD-10-CM

## 2017-01-26 LAB — BASIC METABOLIC PANEL
Anion gap: 12 (ref 5–15)
BUN: 22 mg/dL — ABNORMAL HIGH (ref 6–20)
CO2: 27 mmol/L (ref 22–32)
Calcium: 9.1 mg/dL (ref 8.9–10.3)
Chloride: 99 mmol/L — ABNORMAL LOW (ref 101–111)
Creatinine, Ser: 1.41 mg/dL — ABNORMAL HIGH (ref 0.61–1.24)
GFR calc Af Amer: >60 mL/min (ref >60)
GFR calc non Af Amer: 56 mL/min — ABNORMAL LOW (ref >60)
Glucose, Bld: 109 mg/dL — ABNORMAL HIGH (ref 65–99)
Potassium: 4.6 mmol/L (ref 3.5–5.1)
Sodium: 138 mmol/L (ref 135–145)

## 2017-01-26 LAB — CBC
HCT: 48.1 % (ref 39.0–52.0)
Hemoglobin: 16.3 g/dL (ref 13.0–17.0)
MCH: 29.6 pg (ref 26.0–34.0)
MCHC: 33.9 g/dL (ref 30.0–36.0)
MCV: 87.3 fL (ref 78.0–100.0)
Platelets: 224 10*3/uL (ref 150–400)
RBC: 5.51 MIL/uL (ref 4.22–5.81)
RDW: 14.2 % (ref 11.5–15.5)
WBC: 5.6 10*3/uL (ref 4.0–10.5)

## 2017-01-26 LAB — PROCALCITONIN: Procalcitonin: 3.55 ng/mL

## 2017-01-26 MED ORDER — OSELTAMIVIR PHOSPHATE 75 MG PO CAPS: 75.0000 mg | ORAL_CAPSULE | Freq: Two times a day (BID) | ORAL | 0 refills | Status: AC

## 2017-01-26 MED ORDER — FUROSEMIDE 20 MG PO TABS: 20.0000 mg | ORAL_TABLET | Freq: Every day | ORAL | 1 refills | Status: DC | PRN

## 2017-01-26 MED ORDER — OSELTAMIVIR PHOSPHATE 75 MG PO CAPS: 75.0000 mg | ORAL_CAPSULE | Freq: Two times a day (BID) | ORAL | 0 refills | Status: DC

## 2017-01-26 MED ORDER — AZITHROMYCIN 250 MG PO TABS: 250.0000 mg | ORAL_TABLET | ORAL | Status: DC

## 2017-01-26 MED ORDER — AZITHROMYCIN 250 MG PO TABS: 250.0000 mg | ORAL_TABLET | Freq: Every day | ORAL | 0 refills | Status: AC

## 2017-01-26 MED ORDER — AZITHROMYCIN 250 MG PO TABS: 250.0000 mg | ORAL_TABLET | Freq: Every day | ORAL | 0 refills | Status: DC

## 2017-01-26 MED ORDER — CARVEDILOL 3.125 MG PO TABS: 3.1250 mg | ORAL_TABLET | Freq: Two times a day (BID) | ORAL | 3 refills | Status: DC

## 2017-01-26 MED FILL — CARVEDILOL 3.125 MG TABLET: 3.125 | 30 days supply | Qty: 60 | Fill #0

## 2017-01-26 MED FILL — FUROSEMIDE 20 MG TABLET: 20 | 30 days supply | Qty: 30 | Fill #0

## 2017-01-26 MED FILL — AZITHROMYCIN 250 MG TABLET: 250 | 3 days supply | Qty: 3 | Fill #0

## 2017-01-26 MED FILL — OSELTAMIVIR PHOS 75 MG CAP: 75 | 3 days supply | Qty: 6 | Fill #0

## 2017-01-26 NOTE — Telephone Encounter (Signed)
Lm for rtc 

## 2017-01-26 NOTE — Discharge Summary (Signed)
Name: Tyler Wong MRN: [id number] DOB: January 28, 1966 51 y.o. PCP: Collier Salina, MD  Date of Admission: 01/24/2017  6:44 AM Date of Discharge: 01/26/2017 Attending Physician: Sid Falcon, MD  Discharge Diagnosis: 1. Acute on chronic combined CHF 2. Influenza A 3. Community acquired pneumonia 4. Cocaine abuse 5. AKI  Principal Problem:   Acute exacerbation of congestive heart failure (HCC) Active Problems:   Cocaine use   CAD S/P percutaneous coronary angioplasty   Chest pain   AKI (acute kidney injury) ([place])   CAP (community acquired pneumonia)   CHF exacerbation ([place])   Influenza A   Discharge Medications: Allergies as of 01/26/2017   No Known Allergies     Medication List    TAKE these medications   aspirin 81 MG EC tablet Take 1 tablet (81 mg total) by mouth daily.   azithromycin 250 MG tablet Commonly known as:  ZITHROMAX Take 1 tablet (250 mg total) by mouth daily. Through 2/2 Start taking on:  01/27/2017   carvedilol 3.125 MG tablet Commonly known as:  COREG Take 1 tablet (3.125 mg total) by mouth 2 (two) times daily with a meal.   furosemide 20 MG tablet Commonly known as:  LASIX Take 1 tablet (20 mg total) by mouth daily as needed. For weight gain of 3 lbs in 1 day or 5 lbs in 1 week   guaiFENesin-dextromethorphan 100-10 MG/5ML syrup Commonly known as:  ROBITUSSIN DM Take 5 mLs by mouth every 4 (four) hours as needed for cough.   lisinopril 10 MG tablet Commonly known as:  PRINIVIL,ZESTRIL Take 1 tablet (10 mg total) by mouth daily.   multivitamin tablet Take 1 tablet by mouth daily.   nitroGLYCERIN 0.4 MG SL tablet Commonly known as:  NITROSTAT Place 1 tablet (0.4 mg total) under the tongue every 5 (five) minutes as needed for chest pain.   oseltamivir 75 MG capsule Commonly known as:  TAMIFLU Take 1 capsule (75 mg total) by mouth 2 (two) times daily. Through 2/2   rosuvastatin 20 MG tablet Commonly known as:  CRESTOR Take 1 tablet  (20 mg total) by mouth daily at 6 PM.       Disposition and follow-up:   [name] was discharged from [facility] in Stable condition.  At the hospital follow up visit please address:  1.  Acute on chronic combined CHF - assess weight, orthopnea, frequency of prn Lasix use, adherence with daily Lisinopril and Coreg BID  Influenza A - assess for resolution of URI symptoms, adherence/completion of Tamiflu - end date 2/2  Community acquired pneumonia - assess for resolution/worsening of cough, adherence/completion of Azithromycin - end date 2/2  Cocaine abuse - assess for continued cocaine use and counsel cessation or provide rehabilitation resources  AKI - assess for resolution to baseline creatinine 1.1  2.  Labs / imaging needed at time of follow-up: BMP, UDS  3.  Pending labs/ test needing follow-up: None  Follow-up Appointments: Follow-up Information    [facility]. Go on 02/09/2017.   Why:  At 9:45 AM, hospital follow up visit. Please arrive 15 minutes early to check in. Contact information: [address] [place] [facility] Course by problem list: Principal Problem:   Acute exacerbation of congestive heart failure ([place]) Active Problems:   Cocaine use   CAD S/P percutaneous coronary angioplasty   Chest pain   AKI (acute  kidney injury) ([place])   CAP (community acquired pneumonia)   CHF exacerbation ([place])   Influenza A   1. Acute on chronic combined CHF  Tyler Wong  is a 51yo man with PMH of CAD (DES in 2015), HFrEF (EF 30-35% with G1DD in 12/2016), cocaine and tobacco abuse who presented on 1/28 with cough, dyspnea, weight gain, stomach bloating, and orthopnea. BNP 1700 on admission and CXR revealed pulmonary vascular congestion and LLL infiltrate, weighing 179 lbs - 13 lbs above baseline from a few weeks ago. He was started on scheduled IV lasix and achieved brisk diuresis with  visible improvement in his repeat CXR on 1/29. He was started on Coreg 3.125 mg BID on 1/30 with incident. His symptoms had greatly improved and he was deemed stable for discharge on 1/30 with short term follow up scheduled in the IM clinic.  Influenza A - patient found to be influenza A positive on admission,  started on 5 day course of Tamiflu 75mg  BID. Symptoms improved during hospital stay and he was provided with a prescription for his remaining Tamiflu doses on discharge.  Community acquired pneumonia - presenting with new dry cough and found to have left lower lobe infiltrate on 1/28 CXR, received 1 dose Vancomycin and Cefepime in ED. Initial procalcitonin elevated to 7.90 so antibiotics were continued but switched to Ceftriaxone and Azithromycin. Repeat procalcitonin 3.55 on 1/30 and patient remained afebrile without leukocytosis so Ceftriaxone was discontinued. Patient discharge with short prescription to complete 5 day course of Azithromycin.  Cocaine abuse - UDS was cocaine positive on admission, patient endorsed ongoing struggles and use since last hospitalization. Again discussed the need for cessation and the likelihood of continued cardiac damage and worsening heart failure.   AKI - developed mild AKI with creatinine increasing from 1.17 on admission to 1.41 by day of discharge, attributed to IV diuretics.   Discharge Vitals:   BP 105/82 (BP Location: Right Arm)   Pulse 96   Temp 98.4 F (36.9 C) (Oral)   Resp 25   Ht 5' 10.5" (1.791 m)   Wt 170 lb 3.2 oz (77.2 kg)   SpO2 95%   BMI 24.08 kg/m   Pertinent Labs, Studies, and Procedures:  BMP Latest Ref Rng & Units 01/26/2017 01/25/2017 01/24/2017  Glucose 65 - 99 mg/dL 109(H) 96 132(H)  BUN 6 - 20 mg/dL 22(H) 17 15  Creatinine 0.61 - 1.24 mg/dL 1.41(H) 1.27(H) 1.17  BUN/Creat Ratio 9 - 20 - - -  Sodium 135 - 145 mmol/L 138 137 137  Potassium 3.5 - 5.1 mmol/L 4.6 3.8 4.3  Chloride 101 - 111 mmol/L 99(L) 102 105  CO2 22 - 32  mmol/L 27 28 25   Calcium 8.9 - 10.3 mg/dL 9.1 8.8(L) 9.0   Drugs of Abuse     Component Value Date/Time   LABOPIA NONE DETECTED 01/24/2017 0740   COCAINSCRNUR POSITIVE (A) 01/24/2017 0740   LABBENZ NONE DETECTED 01/24/2017 0740   AMPHETMU NONE DETECTED 01/24/2017 0740   THCU NONE DETECTED 01/24/2017 0740   LABBARB NONE DETECTED 01/24/2017 0740    Dg Chest 2 View  Result Date: 01/25/2017 CLINICAL DATA:  Shortness of breath. EXAM: CHEST  2 VIEW COMPARISON:  01/24/2017. FINDINGS: Mediastinum hilar structures normal. Cardiomegaly with stable mild pulmonary venous congestion. Mild bibasilar subsegmental atelectasis. Small right pleural effusion. No pneumothorax. IMPRESSION: 1. Stable cardiomegaly with mild pulmonary venous congestion. Small right pleural effusion again noted without significant change. 2. Mild bibasilar atelectasis. No evidence of progressive  infiltrate. Electronically Signed   By: Marcello Moores  Register   On: 01/25/2017 07:32   Dg Chest 2 View  Result Date: 01/24/2017 CLINICAL DATA:  Chest pain EXAM: CHEST  2 VIEW COMPARISON:  12/30/2016 FINDINGS: Interval development of left lower lobe airspace disease best seen on the lateral view. Suspicious for pneumonia. Small right pleural effusion. Cardiac enlargement with vascular congestion. Negative for pulmonary edema. IMPRESSION: Left lower lobe infiltrate, possible pneumonia Cardiac enlargement with vascular congestion. Small right pleural effusion. Electronically Signed   By: Franchot Gallo M.D.   On: 01/24/2017 07:07    Discharge Instructions: Discharge Instructions    (HEART FAILURE PATIENTS) Call MD:  Anytime you have any of the following symptoms: 1) 3 pound weight gain in 24 hours or 5 pounds in 1 week 2) shortness of breath, with or without a dry hacking cough 3) swelling in the hands, feet or stomach 4) if you have to sleep on extra pillows at night in order to breathe.    Complete by:  As directed    Call MD for:  difficulty  breathing, headache or visual disturbances    Complete by:  As directed    Call MD for:  persistant dizziness or light-headedness    Complete by:  As directed    Diet - low sodium heart healthy    Complete by:  As directed    Discharge instructions    Complete by:  As directed    We believe you this recent illness was the results of a combination of flu, mild pneumonia, and excess fluid on your lungs from your moderate heart failure.   Please take the Lasix 20 mg every other day for the next week.  It is very important that you weigh yourself each day and consistently keep track of your weight. If you notice 3 or more pounds of weight gain in one day or 5 or more pounds of weight gain over the course of a week you should start taking the Lasix 20 mg tablets - once daily - until your weight returns to its normal level.  If you have any questions or concerns, please call the clinic at [phone] or if it is the weekend or after hours, you may call [phone] and ask for the internal medicine resident on call.  We have added a new home medication - Carvedilol (Coreg) 3.125 mg twice daily - which should help protect your heart from worsening heart failure. Please take this in addition to your other heart-protective medications like Lisinopril, Aspirin, and Crestor.  If you notice yourself feeling more dizzy or lightheaded - try taking a half tablet of your Lisinopril (5 mg) daily instead of the full dose.  To treat your mild pneumonia we are sending a prescription for a few more days of antibiotic (Azithromycin) and a few more days of flu medicine (Tamiflu, taken twice daily). Both should end on Feb 2nd.   It is also very important that you avoid cocaine use in the future, as this will continue to damage your heart, eventually beyond repair.  Please return to the internal medicine clinic on 2/13 at 9:45 AM for a hospital follow up appointment.   Increase activity slowly    Complete by:  As  directed       Signed: Asencion Partridge, MD 01/26/2017, 2:49 PM   Pager: [phone]

## 2017-01-26 NOTE — Discharge Instructions (Addendum)
We believe you this recent illness was the results of a combination of flu, mild pneumonia, and excess fluid on your lungs from your moderate heart failure.   Please take the Lasix 20 mg every other day for the next week.  It is very important that you weigh yourself each day and consistently keep track of your weight. If you notice 3 or more pounds of weight gain in one day or 5 or more pounds of weight gain over the course of a week you should start taking the Lasix 20 mg tablets - once daily - until your weight returns to its normal level.  We have added a new home medication - Carvedilol (Coreg) 3.125 mg twice daily - which should help protect your heart from worsening heart failure. Please take this in addition to your other heart-protective medications like Lisinopril, Aspirin, and Crestor.  If you notice yourself feeling more dizzy or lightheaded - try taking a half tablet of your Lisinopril (5 mg) daily instead of the full dose.  To treat your mild pneumonia we are sending a prescription for a few more days of antibiotic (Azithromycin) and a few more days of flu medicine (Tamiflu, taken twice daily). Both should end on Feb 2nd.   It is also very important that you avoid cocaine use in the future, as this will continue to damage your heart, eventually beyond repair.  Please return to the internal medicine clinic on 2/13 at 9:45 AM for a hospital follow up appointment.

## 2017-01-26 NOTE — Progress Notes (Signed)
Subjective: Tyler Wong continues to improve, largely asymptomatic except for mild dry cough. He was started on Coreg this morning and denies any new symptoms or dizziness. He is interested in discharge this afternoon and we discussed the plan for PRN diuretics moving forward.  Net -2.9 L, -2.5 lbs  Objective: Vital signs in last 24 hours: Vitals:   01/25/17 2219 01/26/17 0105 01/26/17 0546 01/26/17 0822  BP: (!) 120/95 120/89 (!) 120/99 (!) 120/99  Pulse: 83 86 87 88  Resp:      Temp: 97.8 F (36.6 C)  98.5 F (36.9 C)   TempSrc: Oral  Oral   SpO2: 94% 95% 97%   Weight:   170 lb 3.2 oz (77.2 kg)   Height:        Intake/Output Summary (Last 24 hours) at 01/26/17 0913 Last data filed at 01/26/17 0600  Gross per 24 hour  Intake              360 ml  Output             3275 ml  Net            -2915 ml   Filed Weights   01/25/17 0406 01/26/17 0546  Weight: 172 lb 11.2 oz (78.3 kg) 170 lb 3.2 oz (77.2 kg)   Physical Exam General appearance: Well-developed thin gentleman resting comfortably in bed, in no distress, breathing comfortably HENT: Normocephalic, atraumatic, moist mucous membranes Cardiovascular: Regular rate and rhythm, no murmurs, rubs, gallops Respiratory/Chest: Clear to ausculation bilaterally, normal work of breathing Abdomen: Soft, non-tender, non-distended Skin: Warm, dry, intact Psych: Normal affect  Labs / Imaging / Procedures: CBC Latest Ref Rng & Units 01/26/2017 01/25/2017 01/24/2017  WBC 4.0 - 10.5 K/uL 5.6 6.5 6.4  Hemoglobin 13.0 - 17.0 g/dL 16.3 14.9 13.4  Hematocrit 39.0 - 52.0 % 48.1 44.3 41.7  Platelets 150 - 400 K/uL 224 206 184   BMP Latest Ref Rng & Units 01/26/2017 01/25/2017 01/24/2017  Glucose 65 - 99 mg/dL 109(H) 96 132(H)  BUN 6 - 20 mg/dL 22(H) 17 15  Creatinine 0.61 - 1.24 mg/dL 1.41(H) 1.27(H) 1.17  BUN/Creat Ratio 9 - 20 - - -  Sodium 135 - 145 mmol/L 138 137 137  Potassium 3.5 - 5.1 mmol/L 4.6 3.8 4.3  Chloride 101 - 111 mmol/L  99(L) 102 105  CO2 22 - 32 mmol/L 27 28 25   Calcium 8.9 - 10.3 mg/dL 9.1 8.8(L) 9.0   No results found. Assessment/Plan:  Principal Problem:   Acute exacerbation of congestive heart failure (HCC) Active Problems:   Cocaine use   CAD S/P percutaneous coronary angioplasty   Chest pain   HCAP (healthcare-associated pneumonia)   CHF exacerbation (HCC)   Influenza A  Acute on chronic combined CHF, LVEF 30-35% and G1DD in 12/2016, ICM with CAD, recent admit for cocaine associated chest pain, ongoing cocaine abuse. Minimal evidence of CHF on physical exam but reports significant orthopnea. BNP 1700 on admit, pulm vasc congestion on CXR, and weight 13 lbs up from baseline 166 lbs. Weight down to 170 lbs, creatinine increasing and 1.4 today. - Dc Lasix today, will Rx prn Lasix po today - Strict I/Os, daily weights - Lisinopril 10 mg  - Start low dose Carvedilol 3.125 mg BID - there is increasing evidence of safety of beta blocker use in HF patients with ongoing cocaine use - Leta Speller al 2017 - PMID: [id number])  Influenza A, several days of URI symptoms and malaise -  Tamiflu 75 mg for 5 days, 6 doses left, ends on 2/2 - Supportive care, encourage hydration, robitussin PRN  LLL infiltrate, seen on initial CXR, largely absent on repeat 1 day later. Procalcitonin elevated at 7.9, received 1 dose of vanc/cef in ED, started on Ctx/Azithromycin on 1/28 for empiric cap coverage. Patient with dry cough and influenza A at risk for superimposed bacterial pneumonia however infiltrate resolution with diuresis within 24 hours makes infectious etiology questionable. Afebrile and no leukocytosis. Repeat PCT 3.55 -- Continue empiric Azithromycin 250 mg for 3 more days to complete 5 day course for CAP, ends 2/2 -- Dc CTX  Mild AKI, Cr 1.4 from baseline 1.1 - likely due to diuresis - Encouraged adequate fluid intake  Cocaine abuse, UDS positive for cocaine, has been positive since 2015 -- Discussed the  severity of this habit on his cardiac health and the need for cessation and he voiced understanding  Abdominal pain, likely related to cough or nonspecific gastritis, resolved with GI cocktail - GI cocktail TID PRN  CAD, s/p LAD DES placed in 2015, no chest pain or signif EKG changes this admission, serial troponins negative - continue Asa 81 mg  HLD - continue home Rosuvastatin  Reports getting paperwork together for orange card, likely has financial barriers to obtaining medications, CM consult to address this today prior to DC, may need match letter  Diet: HH diet DVT ppx: Lovenox  Dispo: Anticipated discharge today.  LOS: 2 days   Tyler Partridge, MD 01/26/2017, 9:13 AM Pager: [phone]

## 2017-01-26 NOTE — Care Management Note (Signed)
Case Management Note  Patient Details  Name: Tyler Wong MRN: [id number] Date of Birth: 04-Jul-1966  Subjective/Objective:   CHF, Influenza A                 Action/Plan: Discharge Planning: AVS reviewed: NCM spoke to pt and does not have any insurance. Provided pt with MATCH. Explained he can use once per year with a $3 copay for his medication. Has appt with PCP on 02/09/2017 at 945 am.  PCP  RICE, Resa Miner MD  Expected Discharge Date:  01/26/17               Expected Discharge Plan:  Home/Self Care  In-House Referral:  NA  Discharge planning Services  CM Consult, Follow-up appt scheduled, Medication Assistance, [place] [facility]  Post Acute Care Choice:  NA Choice offered to:  NA  DME Arranged:  N/A DME Agency:  NA  HH Arranged:  NA HH Agency:  NA  Status of Service:  Completed, signed off  If discussed at [place] of Stay Meetings, dates discussed:    Additional Comments:  Erenest Rasher, RN 01/26/2017, 3:15 PM

## 2017-01-26 NOTE — Progress Notes (Signed)
Report received in patient's room via [facility] RN using [employer], updated on new orders, VS, and events of the day, assumed care of patient.

## 2017-01-26 NOTE — Telephone Encounter (Signed)
Hospital TOC. 

## 2017-01-29 NOTE — Telephone Encounter (Signed)
Pt not available - talked to his friend, Chrys Racer, stated he's doing fine but will have him to call back.

## 2017-02-08 NOTE — Telephone Encounter (Signed)
Called again, pt not available, closed

## 2017-02-09 ENCOUNTER — Ambulatory Visit: Payer: Self-pay

## 2017-03-18 ENCOUNTER — Telehealth: Payer: Self-pay | Admitting: Internal Medicine

## 2017-03-18 NOTE — Telephone Encounter (Signed)
APT. REMINDER CALL, LMTCB °

## 2017-03-19 ENCOUNTER — Encounter: Payer: Self-pay | Admitting: Internal Medicine

## 2017-04-12 ENCOUNTER — Encounter (HOSPITAL_COMMUNITY): Payer: Self-pay | Admitting: Emergency Medicine

## 2017-04-12 ENCOUNTER — Emergency Department (HOSPITAL_COMMUNITY): Payer: Self-pay

## 2017-04-12 ENCOUNTER — Observation Stay (HOSPITAL_COMMUNITY)
Admission: EM | Admit: 2017-04-12 | Discharge: 2017-04-13 | Disposition: A | Discharge status: Home / Self Care | Payer: Self-pay | Attending: Internal Medicine | Admitting: Internal Medicine

## 2017-04-12 DIAGNOSIS — I110 Hypertensive heart disease with heart failure: Principal | ICD-10-CM | POA: Insufficient documentation

## 2017-04-12 DIAGNOSIS — E785 Hyperlipidemia, unspecified: Secondary | ICD-10-CM | POA: Diagnosis present

## 2017-04-12 DIAGNOSIS — F17210 Nicotine dependence, cigarettes, uncomplicated: Secondary | ICD-10-CM | POA: Insufficient documentation

## 2017-04-12 DIAGNOSIS — Z9861 Coronary angioplasty status: Secondary | ICD-10-CM

## 2017-04-12 DIAGNOSIS — Z91120 Patient's intentional underdosing of medication regimen due to financial hardship: Secondary | ICD-10-CM

## 2017-04-12 DIAGNOSIS — N179 Acute kidney failure, unspecified: Secondary | ICD-10-CM | POA: Insufficient documentation

## 2017-04-12 DIAGNOSIS — I252 Old myocardial infarction: Secondary | ICD-10-CM | POA: Insufficient documentation

## 2017-04-12 DIAGNOSIS — I5040 Unspecified combined systolic (congestive) and diastolic (congestive) heart failure: Secondary | ICD-10-CM | POA: Diagnosis present

## 2017-04-12 DIAGNOSIS — I2510 Atherosclerotic heart disease of native coronary artery without angina pectoris: Secondary | ICD-10-CM

## 2017-04-12 DIAGNOSIS — R7989 Other specified abnormal findings of blood chemistry: Secondary | ICD-10-CM

## 2017-04-12 DIAGNOSIS — R740 Nonspecific elevation of levels of transaminase and lactic acid dehydrogenase [LDH]: Secondary | ICD-10-CM | POA: Insufficient documentation

## 2017-04-12 DIAGNOSIS — I5042 Chronic combined systolic (congestive) and diastolic (congestive) heart failure: Secondary | ICD-10-CM | POA: Diagnosis present

## 2017-04-12 DIAGNOSIS — Z79899 Other long term (current) drug therapy: Secondary | ICD-10-CM | POA: Insufficient documentation

## 2017-04-12 DIAGNOSIS — I10 Essential (primary) hypertension: Secondary | ICD-10-CM | POA: Diagnosis present

## 2017-04-12 DIAGNOSIS — Z720 Tobacco use: Secondary | ICD-10-CM | POA: Diagnosis present

## 2017-04-12 DIAGNOSIS — F1490 Cocaine use, unspecified, uncomplicated: Secondary | ICD-10-CM | POA: Diagnosis present

## 2017-04-12 DIAGNOSIS — Z8249 Family history of ischemic heart disease and other diseases of the circulatory system: Secondary | ICD-10-CM | POA: Insufficient documentation

## 2017-04-12 DIAGNOSIS — Z955 Presence of coronary angioplasty implant and graft: Secondary | ICD-10-CM | POA: Insufficient documentation

## 2017-04-12 DIAGNOSIS — Z91199 Patient's noncompliance with other medical treatment and regimen due to unspecified reason: Secondary | ICD-10-CM

## 2017-04-12 DIAGNOSIS — B1920 Unspecified viral hepatitis C without hepatic coma: Secondary | ICD-10-CM | POA: Diagnosis present

## 2017-04-12 DIAGNOSIS — Z9119 Patient's noncompliance with other medical treatment and regimen: Secondary | ICD-10-CM

## 2017-04-12 DIAGNOSIS — I509 Heart failure, unspecified: Secondary | ICD-10-CM

## 2017-04-12 DIAGNOSIS — I5043 Acute on chronic combined systolic (congestive) and diastolic (congestive) heart failure: Secondary | ICD-10-CM | POA: Diagnosis present

## 2017-04-12 DIAGNOSIS — Z7982 Long term (current) use of aspirin: Secondary | ICD-10-CM | POA: Insufficient documentation

## 2017-04-12 DIAGNOSIS — Z9114 Patient's other noncompliance with medication regimen: Secondary | ICD-10-CM | POA: Insufficient documentation

## 2017-04-12 DIAGNOSIS — F1410 Cocaine abuse, uncomplicated: Secondary | ICD-10-CM | POA: Insufficient documentation

## 2017-04-12 LAB — I-STAT VENOUS BLOOD GAS, ED
Acid-Base Excess: 3 mmol/L — ABNORMAL HIGH (ref 0.0–2.0)
Bicarbonate: 25.5 mmol/L (ref 20.0–28.0)
O2 Saturation: 93 %
TCO2: 26 mmol/L (ref 0–100)
pCO2, Ven: 32.9 mmHg — ABNORMAL LOW (ref 44.0–60.0)
pH, Ven: 7.497 — ABNORMAL HIGH (ref 7.250–7.430)
pO2, Ven: 60 mmHg — ABNORMAL HIGH (ref 32.0–45.0)

## 2017-04-12 LAB — COMPREHENSIVE METABOLIC PANEL
ALT: 103 U/L — ABNORMAL HIGH (ref 17–63)
AST: 48 U/L — ABNORMAL HIGH (ref 15–41)
Albumin: 2.9 g/dL — ABNORMAL LOW (ref 3.5–5.0)
Alkaline Phosphatase: 113 U/L (ref 38–126)
Anion gap: 8 (ref 5–15)
BUN: 25 mg/dL — ABNORMAL HIGH (ref 6–20)
CO2: 22 mmol/L (ref 22–32)
Calcium: 8.6 mg/dL — ABNORMAL LOW (ref 8.9–10.3)
Chloride: 108 mmol/L (ref 101–111)
Creatinine, Ser: 1.84 mg/dL — ABNORMAL HIGH (ref 0.61–1.24)
GFR calc Af Amer: 47 mL/min — ABNORMAL LOW (ref >60)
GFR calc non Af Amer: 41 mL/min — ABNORMAL LOW (ref >60)
Glucose, Bld: 120 mg/dL — ABNORMAL HIGH (ref 65–99)
Potassium: 4.8 mmol/L (ref 3.5–5.1)
Sodium: 138 mmol/L (ref 135–145)
Total Bilirubin: 0.9 mg/dL (ref 0.3–1.2)
Total Protein: 6 g/dL — ABNORMAL LOW (ref 6.5–8.1)

## 2017-04-12 LAB — D-DIMER, QUANTITATIVE: D-Dimer, Quant: 0.69 ug/mL-FEU — ABNORMAL HIGH (ref 0.00–0.50)

## 2017-04-12 LAB — RAPID URINE DRUG SCREEN, HOSP PERFORMED
Amphetamines: NOT DETECTED
Barbiturates: NOT DETECTED
Benzodiazepines: NOT DETECTED
Cocaine: POSITIVE — AB
Opiates: NOT DETECTED
Tetrahydrocannabinol: NOT DETECTED

## 2017-04-12 LAB — CBC
HCT: 43.5 % (ref 39.0–52.0)
Hemoglobin: 14.8 g/dL (ref 13.0–17.0)
MCH: 30 pg (ref 26.0–34.0)
MCHC: 34 g/dL (ref 30.0–36.0)
MCV: 88.1 fL (ref 78.0–100.0)
Platelets: 251 10*3/uL (ref 150–400)
RBC: 4.94 MIL/uL (ref 4.22–5.81)
RDW: 15.2 % (ref 11.5–15.5)
WBC: 6.6 10*3/uL (ref 4.0–10.5)

## 2017-04-12 LAB — TROPONIN I
Troponin I: 0.04 ng/mL (ref <0.03)
Troponin I: <0.03 ng/mL (ref <0.03)

## 2017-04-12 LAB — BRAIN NATRIURETIC PEPTIDE: B Natriuretic Peptide: 1332.5 pg/mL — ABNORMAL HIGH (ref 0.0–100.0)

## 2017-04-12 LAB — LIPASE, BLOOD: Lipase: 29 U/L (ref 11–51)

## 2017-04-12 LAB — I-STAT TROPONIN, ED: Troponin i, poc: 0.01 ng/mL (ref 0.00–0.08)

## 2017-04-12 MED ORDER — ACETAMINOPHEN 325 MG PO TABS: 650.0000 mg | ORAL_TABLET | Freq: Four times a day (QID) | ORAL | Status: DC | PRN

## 2017-04-12 MED ORDER — NITROGLYCERIN 0.4 MG SL SUBL
0.4000 mg | SUBLINGUAL_TABLET | Freq: Once | SUBLINGUAL | Status: AC
  Administered 2017-04-12: 0.4 mg via SUBLINGUAL
  Filled 2017-04-12: qty 1

## 2017-04-12 MED ORDER — ALBUTEROL SULFATE (2.5 MG/3ML) 0.083% IN NEBU: 5.0000 mg | INHALATION_SOLUTION | Freq: Once | RESPIRATORY_TRACT | Status: DC

## 2017-04-12 MED ORDER — ASPIRIN EC 81 MG PO TBEC
81.0000 mg | DELAYED_RELEASE_TABLET | Freq: Every day | ORAL | Status: DC
  Administered 2017-04-13: 81 mg via ORAL
  Filled 2017-04-12: qty 1

## 2017-04-12 MED ORDER — LISINOPRIL 10 MG PO TABS: 10.0000 mg | ORAL_TABLET | Freq: Every day | ORAL | Status: DC

## 2017-04-12 MED ORDER — HYDRALAZINE HCL 20 MG/ML IJ SOLN
5.0000 mg | Freq: Once | INTRAMUSCULAR | Status: AC
  Administered 2017-04-12: 5 mg via INTRAVENOUS
  Filled 2017-04-12: qty 1

## 2017-04-12 MED ORDER — FUROSEMIDE 10 MG/ML IJ SOLN
20.0000 mg | Freq: Once | INTRAMUSCULAR | Status: AC
  Administered 2017-04-12: 20 mg via INTRAVENOUS
  Filled 2017-04-12: qty 2

## 2017-04-12 MED ORDER — HYDRALAZINE HCL 20 MG/ML IJ SOLN: 5.0000 mg | Freq: Four times a day (QID) | INTRAMUSCULAR | Status: DC | PRN

## 2017-04-12 MED ORDER — ACETAMINOPHEN 650 MG RE SUPP: 650.0000 mg | Freq: Four times a day (QID) | RECTAL | Status: DC | PRN

## 2017-04-12 MED ORDER — HEPARIN SODIUM (PORCINE) 5000 UNIT/ML IJ SOLN
5000.0000 [IU] | Freq: Three times a day (TID) | INTRAMUSCULAR | Status: DC
  Administered 2017-04-12 – 2017-04-13 (×2): 5000 [IU] via SUBCUTANEOUS
  Filled 2017-04-12 (×2): qty 1

## 2017-04-12 MED ORDER — ROSUVASTATIN CALCIUM 20 MG PO TABS
20.0000 mg | ORAL_TABLET | Freq: Every day | ORAL | Status: DC
  Administered 2017-04-12: 20 mg via ORAL
  Filled 2017-04-12: qty 1

## 2017-04-12 MED ORDER — FUROSEMIDE 10 MG/ML IJ SOLN
20.0000 mg | Freq: Two times a day (BID) | INTRAMUSCULAR | Status: DC
  Administered 2017-04-12 – 2017-04-13 (×2): 20 mg via INTRAVENOUS
  Filled 2017-04-12 (×2): qty 2

## 2017-04-12 MED ORDER — SODIUM CHLORIDE 0.9% FLUSH
3.0000 mL | Freq: Two times a day (BID) | INTRAVENOUS | Status: DC
  Administered 2017-04-12 – 2017-04-13 (×3): 3 mL via INTRAVENOUS

## 2017-04-12 MED ORDER — IOPAMIDOL (ISOVUE-370) INJECTION 76%
INTRAVENOUS | Status: AC
  Administered 2017-04-12: 100 mL
  Filled 2017-04-12: qty 100

## 2017-04-12 MED ORDER — NITROGLYCERIN 0.4 MG SL SUBL: 0.4000 mg | SUBLINGUAL_TABLET | SUBLINGUAL | Status: DC | PRN

## 2017-04-12 NOTE — H&P (Signed)
Date: 04/12/2017               Patient Name:  Tyler Wong MRN: [id number]  DOB: 01-Feb-1966 Age / Sex: 51 y.o., male   PCP: Collier Salina, MD         Medical Service: Internal Medicine Teaching Service         Attending Physician: Dr. Lucious Groves, DO    First Contact: Dr. Gay Filler Pager: [phone]  Second Contact: Dr. Tiburcio Pea Pager: [phone]       After Hours (After 5p /  First Contact Pager: [phone]  Weekends / Holidays): Second Contact Pager: [phone]   Chief Complaint: SOB  History of Present Illness: Tyler Wong is a 51 y.o. male with a h/o of HFrEF, HTN, Cocaine abuse who presents with 3 day c/o SOB.  Patient reports that he had shortness of breath and dry cough which began 3 days ago. He denies any chest pain associated with this. He has a history of recent exacerbation of heart failure and admission on 01/24/2017. At that time his echocardiogram showed reduced ejection fraction of 30-35% from his previous of 40-45%. Patient has history of cocaine abuse and reports that his last use was one week ago. Patient endorses some increase in salt consumption over the last several days, reports that he was not taking his daily diuretics but was using them as needed due to financial barriers.  Patient reports that he began to notice some abdominal tightness, early satiety, some swelling in his lower extremity. He reported significant orthopnea over the last several days. Patient denies any productive cough, URI symptoms. In the emergency department he received 20 mg of IV Lasix and on our interview was feeling significantly improved. Pt did still complain of some shortness of breath.  On arrival patient was hemodynamically stable with mild tachypnea and borderline tachycardia. His blood pressures were 364W to 803O systolic and 12Y to 482N diastolic. Patient received 1 dose of sublingual nitroglycerin in the emergency department in addition to his IV Lasix. Patient did not require  supplemental O2 and maintained saturations in the high 90s on RA. Patient underwent CTA dissection protocol which was remarkable only for mild amount of pulmonary edema and small pleural effusions as well as some bowel wall edema and abdominal wall edema. Patient was admitted for exacerbation of CHF and diuresis.  Meds: Medications Prior to Admission  Medication Sig Dispense Refill  . aspirin 81 MG EC tablet Take 1 tablet (81 mg total) by mouth daily. 30 tablet 2  . carvedilol (COREG) 3.125 MG tablet Take 1 tablet (3.125 mg total) by mouth 2 (two) times daily with a meal. 60 tablet 3  . furosemide (LASIX) 20 MG tablet Take 1 tablet (20 mg total) by mouth daily as needed. For weight gain of 3 lbs in 1 day or 5 lbs in 1 week 30 tablet 1  . lisinopril (PRINIVIL,ZESTRIL) 10 MG tablet Take 1 tablet (10 mg total) by mouth daily. 30 tablet 1  . Multiple Vitamin (MULTIVITAMIN) tablet Take 1 tablet by mouth daily.    . rosuvastatin (CRESTOR) 20 MG tablet Take 1 tablet (20 mg total) by mouth daily at 6 PM. 30 tablet 2  . guaiFENesin-dextromethorphan (ROBITUSSIN DM) 100-10 MG/5ML syrup Take 5 mLs by mouth every 4 (four) hours as needed for cough.    . nitroGLYCERIN (NITROSTAT) 0.4 MG SL tablet Place 1 tablet (0.4 mg total) under the tongue every 5 (five) minutes as needed for chest pain.  25 tablet 3   Allergies: Allergies as of 04/12/2017  . (No Known Allergies)   Past Medical History:  Diagnosis Date  . CAD in native artery 07/17/14   STEMI- LAD stenosis with DES  . Cocaine abuse   . Dyspnea   . Hypertension   . ST elevation myocardial infarction (STEMI) involving left anterior descending (LAD) coronary artery with complication ([place])   . Tobacco abuse 07/17/2014   Family History: Pt family history includes Heart attack in his brother, mother, and sister; Heart failure in his sister.  Social History: Pt  reports that he has been smoking Cigarettes.  He has a 15.00 pack-year smoking history. He has  never used smokeless tobacco. He reports that he uses drugs, including Cocaine. He reports that he does not drink alcohol.  Review of Systems: A complete ROS was negative except as per HPI. Review of Systems  Constitutional: Negative for chills, fever and weight loss.  HENT: Negative for congestion.   Eyes: Negative for blurred vision.  Respiratory: Positive for shortness of breath. Negative for cough, sputum production and wheezing.   Cardiovascular: Positive for orthopnea and leg swelling. Negative for chest pain and palpitations.  Gastrointestinal: Negative for abdominal pain, constipation, diarrhea, nausea and vomiting.  Genitourinary: Negative for dysuria, frequency and urgency.  Musculoskeletal: Negative for myalgias.  Skin: Negative for rash.  Neurological: Negative for dizziness, tremors and headaches.  Endo/Heme/Allergies: Negative for polydipsia.  Psychiatric/Behavioral: The patient is not nervous/anxious.    Physical Exam: Vitals:   04/12/17 0904 04/12/17 0930 04/12/17 1000 04/12/17 1310  BP: (!) 129/106 (!) 124/104 (!) 130/91 (!) 137/106  Pulse:  96  (!) 103  Resp:    18  Temp:    98.3 F (36.8 C)  TempSrc:    Oral  SpO2:  97%  98%  Weight:    163 lb 14.4 oz (74.3 kg)  Height:    5\' 11"  (1.803 m)   Physical Exam  Constitutional: He is oriented to person, place, and time. He appears well-developed and well-nourished. He is cooperative. No distress.  HENT:  Head: Normocephalic and atraumatic.  Right Ear: Hearing normal.  Left Ear: Hearing normal.  Nose: Nose normal.  Mouth/Throat: Mucous membranes are normal.  Cardiovascular: Normal rate, regular rhythm, S1 normal, S2 normal and intact distal pulses.  Exam reveals no gallop.   No murmur heard. Pulmonary/Chest: Effort normal. No respiratory distress. He has no wheezes. He has no rhonchi. He has rales (coarse crackles in bilateral bases). He exhibits no tenderness.  Abdominal: Soft. Normal appearance and bowel sounds  are normal. He exhibits no distension, no fluid wave and no ascites. There is no hepatosplenomegaly. There is generalized tenderness (mild).  Musculoskeletal: He exhibits edema (No LE edema LLE, trace RLE).  Neurological: He is alert and oriented to person, place, and time. He has normal strength.  Skin: Skin is warm, dry and intact. He is not diaphoretic.  Psychiatric: He has a normal mood and affect. His speech is normal and behavior is normal.   Labs: ABG:  Recent Labs Lab 04/12/17 0926  HCO3 25.5  TCO2 26  O2SAT 93.0   CBC:  Recent Labs Lab 04/12/17 0845  WBC 6.6  HGB 14.8  HCT 43.5  MCV 88.1  PLT 976   Basic Metabolic Panel:  Recent Labs Lab 04/12/17 0845  NA 138  K 4.8  CL 108  CO2 22  GLUCOSE 120*  BUN 25*  CREATININE 1.84*  CALCIUM 8.6*   Cardiac  Enzymes:  Recent Labs Lab 04/12/17 0924  TROPIPOC 0.01   BNP (last 3 results)  Recent Labs  12/30/16 1005 01/24/17 0608 04/12/17 0845  BNP 375.7* 1,743.5* 1,332.5*   Liver Function Tests:  Recent Labs Lab 04/12/17 0845  AST 48*  ALT 103*  ALKPHOS 113  BILITOT 0.9  PROT 6.0*  ALBUMIN 2.9*    Recent Labs Lab 04/12/17 0845  LIPASE 29   CBG: Lab Results  Component Value Date   HGBA1C 5.8 (H) 07/18/2014   Drugs of Abuse     Component Value Date/Time   LABOPIA NONE DETECTED 04/12/2017 0859   COCAINSCRNUR POSITIVE (A) 04/12/2017 0859   LABBENZ NONE DETECTED 04/12/2017 0859   AMPHETMU NONE DETECTED 04/12/2017 0859   THCU NONE DETECTED 04/12/2017 0859   LABBARB NONE DETECTED 04/12/2017 0859    Imaging: EKG Interpretation  Date/Time:  Monday April 12 2017 07:57:02 EDT Ventricular Rate:  95 PR Interval:  166 QRS Duration: 78 QT Interval:  396 QTC Calculation: 497 R Axis:   -63 Text Interpretation:  Normal sinus rhythm Biatrial enlargement Left axis deviation Inferior infarct , age undetermined Anterolateral infarct , age undetermined Abnormal ECG No significant change since last  tracing Confirmed by ISAACS MD, CAMERON [phone]) on 04/12/2017 8:44:06 AM  Echocardiogram   Collection Time: 12/31/16  9:21 AM   Study Conclusions: - Left ventricle: The cavity size was normal. Systolic function was moderately to severely reduced. The estimated ejection fraction was in the range of 30% to 35%. There is akinesis of the mid-apicalanteroseptal, inferoseptal, and apical myocardium. Doppler parameters are consistent with abnormal left ventricular relaxation (grade 1 diastolic dysfunction). - Mitral valve: There was moderate to severe regurgitation. - Pulmonary arteries: Systolic pressure was mildly to moderately increased. PA peak pressure: 48 mm Hg (S).  Impressions: - Since last echo EF decreased from 45 to 35%   Dg Chest 2 View  Result Date: 04/12/2017 CLINICAL DATA:  Shortness of breath. EXAM: CHEST  2 VIEW COMPARISON:  01/25/2017.  04/13/2016. FINDINGS: Mediastinum and hilar structures are normal. Cardiomegaly with pulmonary vascular prominence and interstitial prominence. Small bilateral pleural effusions. Findings are consistent with mild CHF similar findings noted on prior exam. No pneumothorax. No acute bony abnormality . IMPRESSION: Congestive heart failure with mild bilateral pulmonary interstitial edema and small bilateral pleural effusions. Electronically Signed   By: Marcello Moores  Register   On: 04/12/2017 08:57   Ct Angio Chest/abd/pel For Dissection W And/or Wo Contrast  Result Date: 04/12/2017 CLINICAL DATA:  Chest tightness since yesterday in patient with a history of cocaine abuse. Shortness of breath today. EXAM: CT ANGIOGRAPHY CHEST, ABDOMEN AND PELVIS TECHNIQUE: Multidetector CT imaging through the chest, abdomen and pelvis was performed using the standard protocol during bolus administration of intravenous contrast. Multiplanar reconstructed images and MIPs were obtained and reviewed to evaluate the vascular anatomy. CONTRAST:  80 cc Isovue 370. COMPARISON:  PA and  lateral chest 04/12/2017 04/13/2016. FINDINGS: CTA CHEST FINDINGS Cardiovascular: There is no aortic dissection or aneurysm. Marked cardiomegaly is identified. Small pericardial effusion is identified. Stent in the LAD is noted. Contrast refluxes into the inferior vena cava consistent with right heart insufficiency. Mediastinum/Nodes: No enlarged mediastinal, hilar, or axillary lymph nodes. Thyroid gland, trachea, and esophagus demonstrate no significant findings. Lungs/Pleura: Small bilateral pleural effusions are present, larger on the right. Mild dependent atelectasis is noted. Scattered ground-glass attenuation is seen. There is some interlobular septal thickening. Airways are unremarkable. Musculoskeletal: No fracture or focal bony lesion. Review of  the MIP images confirms the above findings. CTA ABDOMEN AND PELVIS FINDINGS VASCULAR Aorta: No dissection or aneurysm.  The aorta is tortuous. Celiac: Widely patent. SMA: Widely patent. Renals: Single bilateral renal arteries are widely patent. IMA: Widely patent. Veins: Negative. Review of the MIP images confirms the above findings. NON-VASCULAR Hepatobiliary: The gallbladder appears normal. Small cluster of calcifications in the right hepatic lobe may be due to some prior infectious or inflammatory process. The liver otherwise appears normal. Pancreas: Unremarkable. No pancreatic ductal dilatation or surrounding inflammatory changes. Spleen: Normal in size without focal abnormality. Adrenals/Urinary Tract: Adrenal glands are unremarkable. Kidneys are normal, without renal calculi, focal lesion, or hydronephrosis. Bladder is unremarkable. Stomach/Bowel: Stomach is within normal limits. Appendix appears normal. No evidence of bowel wall thickening, distention, or inflammatory changes. Lymphatic: No significant vascular findings are present. No enlarged abdominal or pelvic lymph nodes. Reproductive: Prostate is unremarkable. Other: There is mild body wall and  mesenteric edema. No ascites. No hernia. Musculoskeletal: No lytic or sclerotic lesion. Review of the MIP images confirms the above findings. IMPRESSION: Negative for aortic dissection or aneurysm. Marked cardiomegaly with small bilateral pleural effusions and findings consistent with interstitial pulmonary edema. Mild body wall and mesenteric edema consistent with volume overload. Electronically Signed   By: Inge Rise M.D.   On: 04/12/2017 11:33   Assessment & Plan by Problem: Principal Problem:   Acute exacerbation of congestive heart failure (HCC) Active Problems:   Tobacco abuse   Cocaine use   CAD S/P percutaneous coronary angioplasty   Chronic combined systolic and diastolic congestive heart failure (HCC)   Hyperlipidemia   HTN (hypertension)  Tyler Wong is a 51 y.o. male with HTN, HFrEF, cocaine abuse who presents with SOB 2/2 CHF exacerbation.  1) Acute on chronic HFrEF (EF 35%): Patient presents with shortness of breath, chest x-ray concerning for pulmonary edema, CTA with abdominal bowel wall edema in addition to small bilateral pleural effusion, BNP elevated to 1300, rales on exam, small amount of lower show any edema. Patient has a history of CHF which has worsened recently at the beginning of this year on echo 12/31/2016. Patient denies chest pain, initial troponin and EKG not concerning for new ischemia. Patient does have a history of cocaine abuse with STEMI in 2015. Patient does endorse dietary indiscretion as well as poor compliance with diuretic regimen at home. This is likely etiology of his exacerbation. S/p 20 mg IV Lasix with good improvement. Patient's dry weight is unclear, patient reports 278 pounds which was his presenting weight during his last hospitalization, however his discharge weight was 263 pounds. In the ED weight was 170, on the floor 163 today. Patient presents with a cat which may be prerenal we'll follow renal function for improvement with  diuresis. - admit to tele - trend trops x3 - additional 20mg  IV Lassix tonight, 20mg  in AM - strict I/Os + daily wts - recheck BMP in AM  2) AKI: Patient with a CAD serum creatinine to 1.84 was 1.4 on discharge after his last hospitalization. Earlier in the year has had normal creatinine. Suspect he has prerenal hypoperfusion secondary to hypovolemia in the setting of CHF exacerbation. We'll continue to trend serum creatinine. - hold lisinopril - trend BMP  3) HTN: Patient presented with mild diastolic hypertension. He was given nitroglycerin without significant response. We'll hold his home lisinopril due to his history of present illness. Holding beta blocker secondary CHF exacerbation and also in the setting of positive UDS  for cocaine. Patient reports he's run out of one of his blood pressure medicines at home and is not taking this for at least several days, but is unclear on which medication this may be. - hold lisinopril, carvedilol - hydralazine 5mg  IV PRN SBP > 160, DBP > 110.  4) Cocaine Abuse: Patient with long history of cocaine abuse. Reports last use was one week ago, but UDS positive for cocaine today. Denies any chest pain. Currently prescribed carvedilol for heart failure and history of MI, however will touch base with cardiology to verify appropriateness of this medication the setting of current cocaine use. Pt was counseled on dangers of cocaine related to his cardiac history.  DVT PPx - low molecular weight heparin  Code Status - Full  Dispo: Admit patient to Inpatient with expected length of stay greater than 2 midnights.  Signed: Holley Raring, MD 04/12/2017, 3:16 PM  Pager: [phone]

## 2017-04-12 NOTE — ED Notes (Signed)
Pt taken to xray 

## 2017-04-12 NOTE — ED Triage Notes (Signed)
Pt arrives to the ED with sob and tightness in his chest. Pt has a dry non productive cough in triage, pt has labored respirations but lungs sounds clear and equal. pt reports cocaine abuse last use 1 week ago per pt. Pt able to speak in complete sentences.

## 2017-04-12 NOTE — ED Provider Notes (Signed)
[place] DEPT Provider Note   CSN: [id number] Arrival date & time: 04/12/17  0751     History   Chief Complaint Chief Complaint  Patient presents with  . Shortness of Breath    HPI Tyler Wong is a 51 y.o. male.  HPI   51 yo M with PMHx of CAD, cocaine abuse here with SOB. Pt reportedly has h/o heavy cocaine use but last use one week ago. Over past 3 days, he has had progressively worsening SOB and abdominal pain. His SOB is constant but worse with exertion and lying flat. He has also noticed swelling in his bilateral legs. He denies any overt chest pain. His abdominal pain is an aching fullness sensation in his upper abdomen which is worse with palpation and eating. No vomiting but has had nausea. No diarrhea. No alleviating factors.  Past Medical History:  Diagnosis Date  . CAD in native artery 07/17/14   STEMI- LAD stenosis with DES  . Cocaine abuse   . Dyspnea   . Hypertension   . ST elevation myocardial infarction (STEMI) involving left anterior descending (LAD) coronary artery with complication ([place])   . Tobacco abuse 07/17/2014    Patient Active Problem List   Diagnosis Date Noted  . HTN (hypertension) 04/12/2017  . CAP (community acquired pneumonia) 01/24/2017  . Acute exacerbation of congestive heart failure ([place]) 01/24/2017  . CHF exacerbation ([place]) 01/24/2017  . Influenza A 01/24/2017  . Insomnia 01/18/2017  . Chronic combined systolic and diastolic congestive heart failure ([place]) 01/04/2017  . AKI (acute kidney injury) ([place]) 01/04/2017  . Hyperlipidemia 01/04/2017  . Chest pain 12/30/2016  . CAD S/P percutaneous coronary angioplasty 07/18/2014  . Tobacco abuse 07/17/2014  . Cocaine use 07/17/2014    Past Surgical History:  Procedure Laterality Date  . CORONARY ANGIOPLASTY WITH STENT PLACEMENT  07/18/14   resolute DES to LAD STEMI  . LEFT HEART CATH N/A 07/17/2014   Procedure: LEFT HEART CATH;  Surgeon: [name], MD;  Location: [facility] CATH LAB;   Service: Cardiovascular;  Laterality: N/A;  . PERCUTANEOUS CORONARY STENT INTERVENTION (PCI-S)  07/17/2014   Procedure: PERCUTANEOUS CORONARY STENT INTERVENTION (PCI-S);  Surgeon: [name], MD;  Location: [facility] CATH LAB;  Service: Cardiovascular;;       Home Medications    Prior to Admission medications   Medication Sig Start Date End Date Taking? Authorizing Provider  aspirin 81 MG EC tablet Take 1 tablet (81 mg total) by mouth daily. 01/01/17  Yes Asencion Partridge, MD  carvedilol (COREG) 3.125 MG tablet Take 1 tablet (3.125 mg total) by mouth 2 (two) times daily with a meal. 01/26/17  Yes Asencion Partridge, MD  furosemide (LASIX) 20 MG tablet Take 1 tablet (20 mg total) by mouth daily as needed. For weight gain of 3 lbs in 1 day or 5 lbs in 1 week 01/26/17  Yes Asencion Partridge, MD  lisinopril (PRINIVIL,ZESTRIL) 10 MG tablet Take 1 tablet (10 mg total) by mouth daily. 01/18/17  Yes Collier Salina, MD  Multiple Vitamin (MULTIVITAMIN) tablet Take 1 tablet by mouth daily.   Yes Historical Provider, MD  rosuvastatin (CRESTOR) 20 MG tablet Take 1 tablet (20 mg total) by mouth daily at 6 PM. 12/31/16  Yes Asencion Partridge, MD  guaiFENesin-dextromethorphan [facility] DM) 100-10 MG/5ML syrup Take 5 mLs by mouth every 4 (four) hours as needed for cough.    Historical Provider, MD  nitroGLYCERIN (NITROSTAT) 0.4 MG SL tablet Place 1 tablet (0.4 mg total) under  the tongue every 5 (five) minutes as needed for chest pain. 12/31/16   Asencion Partridge, MD    Family History Family History  Problem Relation Age of Onset  . Heart attack Mother   . Heart attack Sister   . Heart failure Sister   . Heart attack Brother     Social History Social History  Substance Use Topics  . Smoking status: Current Every Day Smoker    Packs/day: 0.50    Years: 30.00    Types: Cigarettes  . Smokeless tobacco: Never Used     Comment: cutting back 2-3 per day  . Alcohol use No     Comment: Patient denies abuse, states social drinker      Allergies   Patient has no known allergies.   Review of Systems Review of Systems  Constitutional: Positive for fatigue. Negative for chills and fever.  HENT: Negative for congestion and rhinorrhea.   Eyes: Negative for visual disturbance.  Respiratory: Positive for shortness of breath. Negative for cough and wheezing.   Cardiovascular: Negative for chest pain and leg swelling.  Gastrointestinal: Positive for abdominal pain and nausea. Negative for diarrhea and vomiting.  Genitourinary: Negative for dysuria and flank pain.  Musculoskeletal: Negative for neck pain and neck stiffness.  Skin: Negative for rash and wound.  Allergic/Immunologic: Negative for immunocompromised state.  Neurological: Positive for weakness (generalized). Negative for syncope and headaches.  All other systems reviewed and are negative.    Physical Exam Updated Vital Signs BP (!) 137/106 (BP Location: Right Arm)   Pulse (!) 103   Temp 98.3 F (36.8 C) (Oral)   Resp 18   Ht 5\' 11"  (1.803 m)   Wt 163 lb 14.4 oz (74.3 kg)   SpO2 98%   BMI 22.86 kg/m   Physical Exam  Constitutional: He is oriented to person, place, and time. He appears well-developed and well-nourished. No distress.  HENT:  Head: Normocephalic and atraumatic.  Eyes: Conjunctivae are normal.  Neck: Neck supple.  Cardiovascular: Normal rate, regular rhythm and normal heart sounds.  Exam reveals no friction rub.   No murmur heard. Pulmonary/Chest: Effort normal and breath sounds normal. No respiratory distress. He has no wheezes. He has no rales.  Abdominal: He exhibits no distension.  Musculoskeletal: He exhibits no edema.  Neurological: He is alert and oriented to person, place, and time. He exhibits normal muscle tone.  Skin: Skin is warm. Capillary refill takes less than 2 seconds.  Psychiatric: He has a normal mood and affect.  Nursing note and vitals reviewed.    ED Treatments / Results  Labs (all labs ordered are  listed, but only abnormal results are displayed) Labs Reviewed  RAPID URINE DRUG SCREEN, HOSP PERFORMED - Abnormal; Notable for the following:       Result Value   Cocaine POSITIVE (*)    All other components within normal limits  D-DIMER, QUANTITATIVE (NOT AT [facility]) - Abnormal; Notable for the following:    D-Dimer, Quant 0.69 (*)    All other components within normal limits  BRAIN NATRIURETIC PEPTIDE - Abnormal; Notable for the following:    B Natriuretic Peptide 1,332.5 (*)    All other components within normal limits  COMPREHENSIVE METABOLIC PANEL - Abnormal; Notable for the following:    Glucose, Bld 120 (*)    BUN 25 (*)    Creatinine, Ser 1.84 (*)    Calcium 8.6 (*)    Total Protein 6.0 (*)    Albumin 2.9 (*)  AST 48 (*)    ALT 103 (*)    GFR calc non Af Amer 41 (*)    GFR calc Af Amer 47 (*)    All other components within normal limits  I-STAT VENOUS BLOOD GAS, ED - Abnormal; Notable for the following:    pH, Ven 7.497 (*)    pCO2, Ven 32.9 (*)    pO2, Ven 60.0 (*)    Acid-Base Excess 3.0 (*)    All other components within normal limits  CBC  LIPASE, BLOOD  TROPONIN I  TROPONIN I  I-STAT TROPOININ, ED    EKG  EKG Interpretation  Date/Time:  Monday April 12 2017 07:57:02 EDT Ventricular Rate:  95 PR Interval:  166 QRS Duration: 78 QT Interval:  396 QTC Calculation: 497 R Axis:   -63 Text Interpretation:  Normal sinus rhythm Biatrial enlargement Left axis deviation Inferior infarct , age undetermined Anterolateral infarct , age undetermined Abnormal ECG No significant change since last tracing Confirmed by Khalifa Knecht MD, Nesiah Jump [phone]) on 04/12/2017 8:44:06 AM       Radiology Dg Chest 2 View  Result Date: 04/12/2017 CLINICAL DATA:  Shortness of breath. EXAM: CHEST  2 VIEW COMPARISON:  01/25/2017.  04/13/2016. FINDINGS: Mediastinum and hilar structures are normal. Cardiomegaly with pulmonary vascular prominence and interstitial prominence. Small bilateral  pleural effusions. Findings are consistent with mild CHF similar findings noted on prior exam. No pneumothorax. No acute bony abnormality . IMPRESSION: Congestive heart failure with mild bilateral pulmonary interstitial edema and small bilateral pleural effusions. Electronically Signed   By: Marcello Moores  Register   On: 04/12/2017 08:57   Ct Angio Chest/abd/pel For Dissection W And/or Wo Contrast  Result Date: 04/12/2017 CLINICAL DATA:  Chest tightness since yesterday in patient with a history of cocaine abuse. Shortness of breath today. EXAM: CT ANGIOGRAPHY CHEST, ABDOMEN AND PELVIS TECHNIQUE: Multidetector CT imaging through the chest, abdomen and pelvis was performed using the standard protocol during bolus administration of intravenous contrast. Multiplanar reconstructed images and MIPs were obtained and reviewed to evaluate the vascular anatomy. CONTRAST:  80 cc Isovue 370. COMPARISON:  PA and lateral chest 04/12/2017 04/13/2016. FINDINGS: CTA CHEST FINDINGS Cardiovascular: There is no aortic dissection or aneurysm. Marked cardiomegaly is identified. Small pericardial effusion is identified. Stent in the LAD is noted. Contrast refluxes into the inferior vena cava consistent with right heart insufficiency. Mediastinum/Nodes: No enlarged mediastinal, hilar, or axillary lymph nodes. Thyroid gland, trachea, and esophagus demonstrate no significant findings. Lungs/Pleura: Small bilateral pleural effusions are present, larger on the right. Mild dependent atelectasis is noted. Scattered ground-glass attenuation is seen. There is some interlobular septal thickening. Airways are unremarkable. Musculoskeletal: No fracture or focal bony lesion. Review of the MIP images confirms the above findings. CTA ABDOMEN AND PELVIS FINDINGS VASCULAR Aorta: No dissection or aneurysm.  The aorta is tortuous. Celiac: Widely patent. SMA: Widely patent. Renals: Single bilateral renal arteries are widely patent. IMA: Widely patent. Veins:  Negative. Review of the MIP images confirms the above findings. NON-VASCULAR Hepatobiliary: The gallbladder appears normal. Small cluster of calcifications in the right hepatic lobe may be due to some prior infectious or inflammatory process. The liver otherwise appears normal. Pancreas: Unremarkable. No pancreatic ductal dilatation or surrounding inflammatory changes. Spleen: Normal in size without focal abnormality. Adrenals/Urinary Tract: Adrenal glands are unremarkable. Kidneys are normal, without renal calculi, focal lesion, or hydronephrosis. Bladder is unremarkable. Stomach/Bowel: Stomach is within normal limits. Appendix appears normal. No evidence of bowel wall thickening, distention, or inflammatory  changes. Lymphatic: No significant vascular findings are present. No enlarged abdominal or pelvic lymph nodes. Reproductive: Prostate is unremarkable. Other: There is mild body wall and mesenteric edema. No ascites. No hernia. Musculoskeletal: No lytic or sclerotic lesion. Review of the MIP images confirms the above findings. IMPRESSION: Negative for aortic dissection or aneurysm. Marked cardiomegaly with small bilateral pleural effusions and findings consistent with interstitial pulmonary edema. Mild body wall and mesenteric edema consistent with volume overload. Electronically Signed   By: Inge Rise M.D.   On: 04/12/2017 11:33    Procedures Procedures (including critical care time)  Medications Ordered in ED Medications  aspirin EC tablet 81 mg (not administered)  nitroGLYCERIN (NITROSTAT) SL tablet 0.4 mg (not administered)  rosuvastatin (CRESTOR) tablet 20 mg (not administered)  heparin injection 5,000 Units (not administered)  sodium chloride flush (NS) 0.9 % injection 3 mL (not administered)  acetaminophen (TYLENOL) tablet 650 mg (not administered)    Or  acetaminophen (TYLENOL) suppository 650 mg (not administered)  furosemide (LASIX) injection 20 mg (not administered)    hydrALAZINE (APRESOLINE) injection 5 mg (not administered)  hydrALAZINE (APRESOLINE) injection 5 mg (not administered)  nitroGLYCERIN (NITROSTAT) SL tablet 0.4 mg (0.4 mg Sublingual Given 04/12/17 0943)  furosemide (LASIX) injection 20 mg (20 mg Intravenous Given 04/12/17 0946)  iopamidol (ISOVUE-370) 76 % injection (100 mLs  Contrast Given 04/12/17 1033)     Initial Impression / Assessment and Plan / ED Course  I have reviewed the triage vital signs and the nursing notes.  Pertinent labs & imaging results that were available during my care of the patient were reviewed by me and considered in my medical decision making (see chart for details).    51 yo M with PMHx as above here with progressively worsening DOE, abdominal pain. Pt hypertensive on arrival, o/w HDS. Exam is as above. Labs show elevated BNP, CXR c/f CHF. Given his h/o cocaine use and severe abdominal pain, CT Angio obtained and is neg for dissection. He did have mildly elevated D-Dimer but in setting of cocaine use and CHf, suspect HTN urgency and CHF more likely and he has no LE signs of DVT. Will admit for further work-up. IV lasix given with good effect. BP improving with nitro and sx improved.  Final Clinical Impressions(s) / ED Diagnoses   Final diagnoses:  Acute on chronic combined systolic and diastolic congestive heart failure (HCC)  Elevated d-dimer  Elevated brain natriuretic peptide (BNP) level  AKI (acute kidney injury) [facility])    New Prescriptions Current Discharge Medication List       Duffy Bruce, MD 04/12/17 [phone]

## 2017-04-12 NOTE — Progress Notes (Signed)
MD paged and notified of critical troponin level.  No new orders. Marcille Blanco, RN

## 2017-04-12 NOTE — ED Notes (Signed)
Patient transported to CT 

## 2017-04-13 ENCOUNTER — Telehealth: Payer: Self-pay | Admitting: Internal Medicine

## 2017-04-13 DIAGNOSIS — B1920 Unspecified viral hepatitis C without hepatic coma: Secondary | ICD-10-CM | POA: Diagnosis present

## 2017-04-13 DIAGNOSIS — E785 Hyperlipidemia, unspecified: Secondary | ICD-10-CM

## 2017-04-13 DIAGNOSIS — Z91199 Patient's noncompliance with other medical treatment and regimen due to unspecified reason: Secondary | ICD-10-CM

## 2017-04-13 DIAGNOSIS — I5043 Acute on chronic combined systolic (congestive) and diastolic (congestive) heart failure: Secondary | ICD-10-CM | POA: Diagnosis present

## 2017-04-13 DIAGNOSIS — Z9119 Patient's noncompliance with other medical treatment and regimen: Secondary | ICD-10-CM

## 2017-04-13 LAB — BASIC METABOLIC PANEL
Anion gap: 10 (ref 5–15)
BUN: 22 mg/dL — ABNORMAL HIGH (ref 6–20)
CO2: 27 mmol/L (ref 22–32)
Calcium: 8.6 mg/dL — ABNORMAL LOW (ref 8.9–10.3)
Chloride: 101 mmol/L (ref 101–111)
Creatinine, Ser: 1.42 mg/dL — ABNORMAL HIGH (ref 0.61–1.24)
GFR calc Af Amer: >60 mL/min (ref >60)
GFR calc non Af Amer: 56 mL/min — ABNORMAL LOW (ref >60)
Glucose, Bld: 117 mg/dL — ABNORMAL HIGH (ref 65–99)
Potassium: 3.8 mmol/L (ref 3.5–5.1)
Sodium: 138 mmol/L (ref 135–145)

## 2017-04-13 LAB — CBC
HCT: 44 % (ref 39.0–52.0)
Hemoglobin: 14.8 g/dL (ref 13.0–17.0)
MCH: 29.4 pg (ref 26.0–34.0)
MCHC: 33.6 g/dL (ref 30.0–36.0)
MCV: 87.3 fL (ref 78.0–100.0)
Platelets: 266 10*3/uL (ref 150–400)
RBC: 5.04 MIL/uL (ref 4.22–5.81)
RDW: 15.3 % (ref 11.5–15.5)
WBC: 8.1 10*3/uL (ref 4.0–10.5)

## 2017-04-13 MED ORDER — CARVEDILOL 3.125 MG PO TABS: 3.1250 mg | ORAL_TABLET | Freq: Two times a day (BID) | ORAL | 0 refills | Status: DC

## 2017-04-13 MED ORDER — LISINOPRIL 10 MG PO TABS: 10.0000 mg | ORAL_TABLET | Freq: Every day | ORAL | 0 refills | Status: DC

## 2017-04-13 MED ORDER — ASPIRIN 81 MG PO TBEC: 81.0000 mg | DELAYED_RELEASE_TABLET | Freq: Every day | ORAL | 0 refills | Status: DC

## 2017-04-13 MED ORDER — ROSUVASTATIN CALCIUM 20 MG PO TABS: 20.0000 mg | ORAL_TABLET | Freq: Every day | ORAL | 0 refills | Status: DC

## 2017-04-13 MED ORDER — FUROSEMIDE 20 MG PO TABS: 20.0000 mg | ORAL_TABLET | Freq: Every day | ORAL | 1 refills | Status: DC

## 2017-04-13 MED FILL — ASPIR-LOW 81 MG TABLET EC: 81 | 30 days supply | Qty: 30 | Fill #0

## 2017-04-13 MED FILL — ROSUVASTATIN CALCIUM 20 MG: 20 | 30 days supply | Qty: 30 | Fill #0

## 2017-04-13 MED FILL — FUROSEMIDE 20 MG TABLET: 20 | 30 days supply | Qty: 30 | Fill #0

## 2017-04-13 MED FILL — LISINOPRIL 10 MG TABLET: 10 | 30 days supply | Qty: 30 | Fill #0

## 2017-04-13 MED FILL — CARVEDILOL 3.125 MG TABLET: 3.125 | 30 days supply | Qty: 60 | Fill #0

## 2017-04-13 NOTE — Progress Notes (Signed)
Subjective: Currently, the patient feels improved. Small amount of SOB episodically ON. No LE swelling, abd swelling. Denies CP. Ready for DC. Acknowledges need for medication compliance.  Objective: Vital signs in last 24 hours: Vitals:   04/12/17 2110 04/13/17 0000 04/13/17 0400 04/13/17 0812  BP: (!) 137/93 126/90 (!) 131/96 120/85  Pulse: (!) 104 95 98 (!) 101  Resp: 20 18 18 18   Temp: 98.6 F (37 C) 98.3 F (36.8 C) 98 F (36.7 C) 97.9 F (36.6 C)  TempSrc: Oral Oral Oral Oral  SpO2: 99% 99% 99% 97%  Weight:   160 lb 11.2 oz (72.9 kg)   Height:       24-hour weight change: Filed Weights   04/12/17 0758 04/12/17 1310 04/13/17 0400  Weight: 170 lb (77.1 kg) 163 lb 14.4 oz (74.3 kg) 160 lb 11.2 oz (72.9 kg)   Dry Wt: ?  Intake/Output:  04/16 0701 - 04/17 0700 In: 480 [P.O.:480] Out: 4125 [Urine:4125]    Physical Exam: Physical Exam  Constitutional: He appears well-developed and well-nourished. No distress.  Cardiovascular: Normal rate, regular rhythm, normal heart sounds and intact distal pulses.   Pulmonary/Chest: Effort normal and breath sounds normal. No respiratory distress. He has no wheezes. He has no rales.  Abdominal: Soft. Bowel sounds are normal. There is no tenderness.  Musculoskeletal: He exhibits edema (trace RLE, none LLE).   Labs: CBC:  Recent Labs Lab 04/12/17 0845 04/13/17 0400  WBC 6.6 8.1  HGB 14.8 14.8  HCT 43.5 44.0  MCV 88.1 87.3  PLT 627 035   Metabolic Panel:  Recent Labs Lab 04/12/17 0845 04/13/17 0400  NA 138 138  K 4.8 3.8  CL 108 101  CO2 22 27  GLUCOSE 120* 117*  BUN 25* 22*  CREATININE 1.84* 1.42*  CALCIUM 8.6* 8.6*  ALT 103*  --   ALKPHOS 113  --   BILITOT 0.9  --   PROT 6.0*  --   ALBUMIN 2.9*  --   LIPASE 29  --    Cardiac Labs:  Recent Labs Lab 04/12/17 0845 04/12/17 0924 04/12/17 1609 04/12/17 2121  TROPIPOC  --  0.01  --   --   TROPONINI  --   --  0.04* <0.03  BNP 1,332.5*  --   --   --     BG: No results for input(s): GLUCAP in the last 168 hours. Lab Results  Component Value Date   HGBA1C 5.8 (H) 07/18/2014    Medications: Infusions:  Scheduled Medications: . aspirin EC  81 mg Oral Daily  . furosemide  20 mg Intravenous BID  . heparin  5,000 Units Subcutaneous Q8H  . rosuvastatin  20 mg Oral q1800  . sodium chloride flush  3 mL Intravenous Q12H   PRN Medications: acetaminophen **OR** acetaminophen, hydrALAZINE, nitroGLYCERIN  Assessment/Plan: Pt is a 51 y.o. yo male with a PMHx of cocaine, HTN, CHF who was admitted on 04/12/2017 with symptoms of SOB, which was determined to be secondary to CHF exacerbation.  1) Acute on chronic HFrEF (EF 35%): SOB improved. Lung clear. Output net 3.6L recorded, in addition to ~3L UOP in ED. Will need to have good plan for follow up and will attempt to secure temporary medication assistance at discharge d/t financial barriers. - Restart Lasix 20mg  qD PO at home - Restart lisinopril, coreg, pravastatin, ASA for BP, HF, CAD control at home  2) AKI: Resolved w/ IVF, back to baseline from discharge in January. May have some  underlying CKD 2/2 chronic HTN, HF, cocaine use. f/u as outpt.  3) HTN: Improved this AM with diuresis. Restart home meds.  4) Cocaine Abuse:Counseled on importance of cessation.  5) Reactive HCV Ab: Mild transaminitis, h/o positive HCV Ab w/o documented f/u. Will get HCV quant and other hepatitis serologies. - f/u hepatitis serologies  Length of Stay: 1 day(s) Dispo: Anticipated discharge today.  Holley Raring, MD Pager: [phone] (7AM-5PM) 04/13/2017, 10:35 AM

## 2017-04-13 NOTE — Telephone Encounter (Deleted)
Transition Care Management Follow-up Telephone Call   Date discharged?   How have you been since you were released from the hospital? ***   Do you understand why you were in the hospital?    Do you understand the discharge instructions?   Where were you discharged to?    Items Reviewed:  Medications reviewed:   Allergies reviewed:  Dietary changes reviewed:   Referrals reviewed:    Functional Questionnaire:   Activities of Daily Living (ADLs):   He states they are independent in the following: {adls:17999} States they require assistance with the following: {adls:17999}   Any transportation issues/concerns?: {YES/NO/WILD CARDS:18581}   Any patient concerns? {YES/NO/WILD KLKJZ:79150}   Confirmed importance and date/time of follow-up visits scheduled {YES/NO/WILD VWPVX:48016}  Provider Appointment booked with  Confirmed with patient if condition begins to worsen call PCP or go to the ER.  Patient was given the office number and encouraged to call back with question or concerns.  : {YES/NO/WILD PVVZS:82707}

## 2017-04-13 NOTE — Progress Notes (Signed)
Pt has orders to be discharged. Discharge instructions given and pt has no additional questions at this time. Medication regimen reviewed and pt educated. Pt verbalized understanding and has no additional questions. Telemetry box removed. IV removed and site in good condition. Pt stable and waiting for transportation.   Damion Kant RN 

## 2017-04-13 NOTE — Telephone Encounter (Signed)
Attempt TOC wife reports he is still in hospital will call back for [facility]

## 2017-04-13 NOTE — Discharge Summary (Signed)
Name: Tyler Wong MRN: [id number] DOB: [birth year] 51 y.o. PCP: Collier Salina, MD  Date of Admission: 04/12/2017  8:02 AM Date of Discharge: 04/13/2017 Attending Physician: Lucious Groves, DO  Discharge Diagnosis: Principal Problem:   Acute exacerbation of congestive heart failure ([place]) Active Problems:   Tobacco abuse   Cocaine use   CAD S/P percutaneous coronary angioplasty   Chronic combined systolic and diastolic congestive heart failure (HCC)   Hyperlipidemia   HTN (hypertension)   Hepatitis C   Current non-adherence to medical treatment   Acute on chronic combined systolic (congestive) and diastolic (congestive) heart failure ([place])   Discharge Medications: Allergies as of 04/13/2017   No Known Allergies     Medication List    STOP taking these medications   guaiFENesin-dextromethorphan 100-10 MG/5ML syrup Commonly known as:  ROBITUSSIN DM     TAKE these medications   aspirin 81 MG EC tablet Take 1 tablet (81 mg total) by mouth daily.   carvedilol 3.125 MG tablet Commonly known as:  COREG Take 1 tablet (3.125 mg total) by mouth 2 (two) times daily with a meal.   furosemide 20 MG tablet Commonly known as:  LASIX Take 1 tablet (20 mg total) by mouth daily. What changed:  when to take this  reasons to take this  additional instructions   lisinopril 10 MG tablet Commonly known as:  PRINIVIL,ZESTRIL Take 1 tablet (10 mg total) by mouth daily.   multivitamin tablet Take 1 tablet by mouth daily.   nitroGLYCERIN 0.4 MG SL tablet Commonly known as:  NITROSTAT Place 1 tablet (0.4 mg total) under the tongue every 5 (five) minutes as needed for chest pain.   rosuvastatin 20 MG tablet Commonly known as:  CRESTOR Take 1 tablet (20 mg total) by mouth daily at 6 PM.       Disposition and follow-up:   [name] was discharged from [facility] in Good condition.  At the hospital follow up visit please address:  1.  HFrEF: ensure  medication access and compliance, assess wt and SOB. AKI: consider recheck SCr and urine protein to assess for underlying medicorenal disease as the pt has not ever returned to his baseline since hospitalization in January. HTN / CAD: ensure medication compliance Reactive HCV Ab: Mild transaminitis, h/o HCV Ab positive w/o documented f/u. Ordered HCV quant and other hepatitis serologies to be f/u  Medication Access: Please work with Dr. Maudie Mercury to assist pt with assess to medication. Received one-time fill from [facility], but will need regular access.  2.  Labs / imaging needed at time of follow-up: BMP  3. Pending labs: Hepatitis serologies  Follow-up Appointments: Follow-up Information    [place]. Go on 04/16/2017.   Why:  Appt at 1:15pm. Contact information: [address] [place] Hospital Course by problem list: Principal Problem:   Acute exacerbation of congestive heart failure ([place]) Active Problems:   Tobacco abuse   Cocaine use   CAD S/P percutaneous coronary angioplasty   Chronic combined systolic and diastolic congestive heart failure (HCC)   Hyperlipidemia   HTN (hypertension)   Hepatitis C   Current non-adherence to medical treatment   Acute on chronic combined systolic (congestive) and diastolic (congestive) heart failure ([place])   1. Acute on chronic exacerbation of HFrEF 2/2 ischemic cardiomyopathy: Patient presented with 3 day history of shortness of breath in the setting  of noncompliance with his medications due to financial barriers, dietary indiscretion with increased salt intake, and continued cocaine abuse. Patient denied any chest pain on arrival but was short of breath. He did have an oxygen requirement. His pulmonary edema was noted on chest x-ray as well as CTA dissection protocol which was negative other than pulmonary edema, bowel wall and abdominal wall edema. His BNP was elevated. He was  diuresed with IV Lasix and had good urine output. At the time of discharge he was feeling back to his baseline. He was counseled on appropriate use of his medications. Pt was provided a short supply of medication from the [facility] outpatient pharmacy in order to ensure compliance. He will follow up in our clinic in 3 days. He was discharged on 20 mg Lasix daily as well as his lisinopril, Coreg, rosuvastatin, and aspirin for management of hypertension and ischemic heart disease.  2. AKI: Patient presented with a mild increase in his serum creatinine which was thought secondary to hypervolemia related to CHF exacerbation. Patient's creatinine trended down with diuresis. At the time of discharge it was 1.4 which is at the same level of his discharge back in January 2018. He likely has some underlying CK D as result of his long-standing hypertension, cocaine abuse, and heart failure. He will need continue follow-up for his renal disease in our clinic.  3. Cocaine abuse: Patient continues to abuse cocaine regularly. He was counseled on the importance of cessation for his heart health. Patient acknowledges this and reports that he will attempt to cut back, but I'm concerned that he does not fully understand the gravity of the situation. Continue efforts to encourage sensation as an outpatient.   Discharge Vitals:   BP (!) 125/91 (BP Location: Left Arm)   Pulse 95   Temp 98.1 F (36.7 C) (Oral)   Resp 17   Ht 5\' 11"  (1.803 m)   Wt 160 lb 11.2 oz (72.9 kg)   SpO2 98%   BMI 22.41 kg/m   Pertinent Labs, Studies, and Procedures: As above.  Procedures Performed:  Dg Chest 2 View  Result Date: 04/12/2017 CLINICAL DATA:  Shortness of breath. EXAM: CHEST  2 VIEW COMPARISON:  01/25/2017.  04/13/2016. FINDINGS: Mediastinum and hilar structures are normal. Cardiomegaly with pulmonary vascular prominence and interstitial prominence. Small bilateral pleural effusions. Findings are consistent with mild CHF  similar findings noted on prior exam. No pneumothorax. No acute bony abnormality . IMPRESSION: Congestive heart failure with mild bilateral pulmonary interstitial edema and small bilateral pleural effusions. Electronically Signed   By: Marcello Moores  Register   On: 04/12/2017 08:57   Ct Angio Chest/abd/pel For Dissection W And/or Wo Contrast  Result Date: 04/12/2017 CLINICAL DATA:  Chest tightness since yesterday in patient with a history of cocaine abuse. Shortness of breath today. EXAM: CT ANGIOGRAPHY CHEST, ABDOMEN AND PELVIS TECHNIQUE: Multidetector CT imaging through the chest, abdomen and pelvis was performed using the standard protocol during bolus administration of intravenous contrast. Multiplanar reconstructed images and MIPs were obtained and reviewed to evaluate the vascular anatomy. CONTRAST:  80 cc Isovue 370. COMPARISON:  PA and lateral chest 04/12/2017 04/13/2016. FINDINGS: CTA CHEST FINDINGS Cardiovascular: There is no aortic dissection or aneurysm. Marked cardiomegaly is identified. Small pericardial effusion is identified. Stent in the LAD is noted. Contrast refluxes into the inferior vena cava consistent with right heart insufficiency. Mediastinum/Nodes: No enlarged mediastinal, hilar, or axillary lymph nodes. Thyroid gland, trachea, and esophagus demonstrate no significant findings. Lungs/Pleura: Small bilateral  pleural effusions are present, larger on the right. Mild dependent atelectasis is noted. Scattered ground-glass attenuation is seen. There is some interlobular septal thickening. Airways are unremarkable. Musculoskeletal: No fracture or focal bony lesion. Review of the MIP images confirms the above findings. CTA ABDOMEN AND PELVIS FINDINGS VASCULAR Aorta: No dissection or aneurysm.  The aorta is tortuous. Celiac: Widely patent. SMA: Widely patent. Renals: Single bilateral renal arteries are widely patent. IMA: Widely patent. Veins: Negative. Review of the MIP images confirms the above  findings. NON-VASCULAR Hepatobiliary: The gallbladder appears normal. Small cluster of calcifications in the right hepatic lobe may be due to some prior infectious or inflammatory process. The liver otherwise appears normal. Pancreas: Unremarkable. No pancreatic ductal dilatation or surrounding inflammatory changes. Spleen: Normal in size without focal abnormality. Adrenals/Urinary Tract: Adrenal glands are unremarkable. Kidneys are normal, without renal calculi, focal lesion, or hydronephrosis. Bladder is unremarkable. Stomach/Bowel: Stomach is within normal limits. Appendix appears normal. No evidence of bowel wall thickening, distention, or inflammatory changes. Lymphatic: No significant vascular findings are present. No enlarged abdominal or pelvic lymph nodes. Reproductive: Prostate is unremarkable. Other: There is mild body wall and mesenteric edema. No ascites. No hernia. Musculoskeletal: No lytic or sclerotic lesion. Review of the MIP images confirms the above findings. IMPRESSION: Negative for aortic dissection or aneurysm. Marked cardiomegaly with small bilateral pleural effusions and findings consistent with interstitial pulmonary edema. Mild body wall and mesenteric edema consistent with volume overload. Electronically Signed   By: Inge Rise M.D.   On: 04/12/2017 11:33   Discharge Instructions: Discharge Instructions    (HEART FAILURE PATIENTS) Call MD:  Anytime you have any of the following symptoms: 1) 3 pound weight gain in 24 hours or 5 pounds in 1 week 2) shortness of breath, with or without a dry hacking cough 3) swelling in the hands, feet or stomach 4) if you have to sleep on extra pillows at night in order to breathe.    Complete by:  As directed    Call MD for:  difficulty breathing, headache or visual disturbances    Complete by:  As directed    Call MD for:  extreme fatigue    Complete by:  As directed    Call MD for:  persistant dizziness or light-headedness    Complete by:   As directed    Diet - low sodium heart healthy    Complete by:  As directed    Discharge instructions    Complete by:  As directed    Had an exacerbation of her heart failure. We very important to take her medications regularly including your fluid pill every day. Additionally you should weigh yourself daily and if you notice an increase in weight or worsening shortness of breath please call our clinic.  We will be able to provide you a short supply of your medications from [facility] outpatient pharmacy. It will be very important that he picked these medications up and go to your follow-up visit which is scheduled for this Friday.   Increase activity slowly    Complete by:  As directed       Signed: Holley Raring, MD 04/13/2017, 2:42 PM   Pager: [phone]

## 2017-04-13 NOTE — Telephone Encounter (Signed)
Needs TOC discharge date 04/13/17 HFU 04/16/17

## 2017-04-14 LAB — HEPATITIS A ANTIBODY, IGM: Hep A IgM: NEGATIVE

## 2017-04-14 LAB — HEPATITIS B CORE ANTIBODY, IGM: Hep B C IgM: NEGATIVE

## 2017-04-14 LAB — HCV RNA QUANT: HCV Quantitative: NOT DETECTED IU/mL (ref >50)

## 2017-04-14 LAB — HEPATITIS B CORE ANTIBODY, TOTAL: Hep B Core Total Ab: POSITIVE — AB

## 2017-04-14 LAB — HEPATITIS B SURFACE ANTIGEN: Hepatitis B Surface Ag: NEGATIVE

## 2017-04-14 LAB — HEPATITIS B SURFACE ANTIBODY,QUALITATIVE: Hep B S Ab: REACTIVE

## 2017-04-16 ENCOUNTER — Ambulatory Visit: Payer: Self-pay

## 2017-04-26 ENCOUNTER — Ambulatory Visit (INDEPENDENT_AMBULATORY_CARE_PROVIDER_SITE_OTHER): Payer: Self-pay | Admitting: Internal Medicine

## 2017-04-26 ENCOUNTER — Encounter: Payer: Self-pay | Admitting: Internal Medicine

## 2017-04-26 VITALS — BP 124/84 | HR 100 | Temp 98.2°F | Wt 170.7 lb

## 2017-04-26 DIAGNOSIS — I5042 Chronic combined systolic (congestive) and diastolic (congestive) heart failure: Secondary | ICD-10-CM

## 2017-04-26 DIAGNOSIS — I5043 Acute on chronic combined systolic (congestive) and diastolic (congestive) heart failure: Secondary | ICD-10-CM

## 2017-04-26 DIAGNOSIS — F1490 Cocaine use, unspecified, uncomplicated: Secondary | ICD-10-CM

## 2017-04-26 DIAGNOSIS — F17210 Nicotine dependence, cigarettes, uncomplicated: Secondary | ICD-10-CM

## 2017-04-26 DIAGNOSIS — I255 Ischemic cardiomyopathy: Secondary | ICD-10-CM

## 2017-04-26 DIAGNOSIS — I252 Old myocardial infarction: Secondary | ICD-10-CM

## 2017-04-26 DIAGNOSIS — I5023 Acute on chronic systolic (congestive) heart failure: Secondary | ICD-10-CM

## 2017-04-26 DIAGNOSIS — F1420 Cocaine dependence, uncomplicated: Secondary | ICD-10-CM

## 2017-04-26 MED ORDER — FUROSEMIDE 20 MG PO TABS: 60.0000 mg | ORAL_TABLET | Freq: Every day | ORAL | 0 refills | Status: DC

## 2017-04-26 MED FILL — FUROSEMIDE 20 MG TABLET: 20 | 30 days supply | Qty: 90 | Fill #0

## 2017-04-26 NOTE — Patient Instructions (Signed)
Thank you for coming to see me today. It was a pleasure. Today we talked about:   Heart Failure: - I have sent in a new prescription to [facility] for Lasix.  Your dose is now 60mg  daily. - Check your weight daily and let us know if you gain more than 3 pounds in 24 hours or 5 pounds in 3 days. - Keep your scheduled appointment on May 3 - I am checking some labs today.  For your cough, can continue Coricidian HBP and try taking Robitussin Dm over the counter cough syrup.  If you get worse, please let us know or go to the ED/Urgent care.  If you have any questions or concerns, please do not hesitate to call the office at [phone].  Take Care,   Jule Ser, DO

## 2017-04-26 NOTE — Assessment & Plan Note (Addendum)
Assessment: Exacerbation of HFrEF due to ischemic cardiomyopathy. In the setting of not taking his Lasix for 10 days, he has experienced 10 pounds of weight gain, although, is without much peripheral edema.  He reports self-titrating his dose from 20mg  daily to 60mg  daily and this improved his symptoms until he ran out of medications.  He otherwise has been adherent to his Lisinopril and Coreg.  Plan: - resume Lasix at 60mg  daily.  New prescription sent - check BMET - reinforced importance of dietary discretion and abstinence from cocaine use - f/u on 5/3 as previously scheduled to assess response and ability to obtain meds - continue lisinopril and beta blocker as he reports adherence  ADDENDUM 7:42 AM 04/27/2017: SCr 1.5 (baseline appears around 1.4).  Plan to repeat BMET 5/3 at f/u visit.  Anticipate improvement with diuresis.  Other electrolytes ok.

## 2017-04-26 NOTE — Assessment & Plan Note (Signed)
Assessment: Ongoing use.  He reports daily use prior to hospitalization but only has used once in the past 10 days.  This is a major riks factor for him in terms of another ischemic injury.    Plan: - we discussed the need for abstinence and the potential contribution of cocaine to his worsening HF or potential for another heart attack (STEMI July 2015)

## 2017-04-26 NOTE — Progress Notes (Signed)
[facility] Medicine Clinic Attending  Case discussed with Dr. Wallace at the time of the visit.  We reviewed the resident's history and exam and pertinent patient test results.  I agree with the assessment, diagnosis, and plan of care documented in the resident's note.  

## 2017-04-26 NOTE — Progress Notes (Signed)
CC: medication issue, SOB, weight gain  HPI:  [name] is a 51 y.o. man with a past medical history listed below here today for follow up of his HFrEF and recent hospitalization for exacerbation thereof.  Upon hospital discharge, his weight was 160 pounds.  Today, his weight is 170 pounds.  He reports being discharged on 20mg  daily of Lasix, however, reports symptoms of worse peripheral edema, SOB, inadequate urine output, and dyspnea on this dose.  He subsequently self-titrated his Lasix to 60mg  daily, but prematurely ran out of his medications.  He has now been without Lasix for 10 days and reports symptoms as above.  Additionally, he reports SOB, orthopnea, cough (brownish sputum), and chills.  He thinks he has a viral illness and has been taking Coric[place] HBP without signifcant relief.  He reports trying to avoid salty foods and has used cocaine once since discharge.  Prior to hospitalization he was a daily cocaine user.  He otherwise denies hx of seasonal allergies or nasal congestion.  Denies fevers but has had some chills.  Has not tried anything else for his cough.   For details of today's visit and the status of his chronic medical issues please refer to the assessment and plan.   Past Medical History:  Diagnosis Date  . CAD in native artery 07/17/14   STEMI- LAD stenosis with DES  . Cocaine abuse   . Dyspnea   . Hypertension   . ST elevation myocardial infarction (STEMI) involving left anterior descending (LAD) coronary artery with complication ([place])   . Tobacco abuse 07/17/2014    Review of Systems:  Please see pertinent ROS reviewed in HPI and problem based charting.   Physical Exam:  Vitals:   04/26/17 0915  BP: 124/84  Pulse: 100  Temp: 98.2 F (36.8 C)  TempSrc: Oral  SpO2: 100%  Weight: 170 lb 11.2 oz (77.4 kg)   General: sitting in chair, dyspneic, on room air HEENT: EOMI, no scleral icterus Neck: + JVD Cardiac: mildly tachycardic, no rubs, murmurs or  gallops Pulm: diffuse crackles bilaterally, moving normal volumes of air Ext: warm and well perfused, trace to 1+ pedal edema Neuro: alert and oriented X3, cranial nerves II-XII grossly intact   Assessment & Plan:   See Encounters Tab for problem based charting.  Patient discussed with Dr. Lynnae January.  Acute exacerbation of congestive heart failure (HCC) Assessment: Exacerbation of HFrEF due to ischemic cardiomyopathy. In the setting of not taking his Lasix for 10 days, he has experienced 10 pounds of weight gain, although, is without much peripheral edema.  He reports self-titrating his dose from 20mg  daily to 60mg  daily and this improved his symptoms until he ran out of medications.  He otherwise has been adherent to his Lisinopril and Coreg.  Plan: - resume Lasix at 60mg  daily.  New prescription sent - check BMET - reinforced importance of dietary discretion and abstinence from cocaine use - f/u on 5/3 as previously scheduled to assess response and ability to obtain meds - continue lisinopril and beta blocker as he reports adherence  Cocaine use Assessment: Ongoing use.  He reports daily use prior to hospitalization but only has used once in the past 10 days.  This is a major riks factor for him in terms of another ischemic injury.    Plan: - we discussed the need for abstinence and the potential contribution of cocaine to his worsening HF or potential for another heart attack (STEMI July 2015)

## 2017-04-27 LAB — BMP8+ANION GAP
Anion Gap: 16 mmol/L (ref 10.0–18.0)
BUN/Creatinine Ratio: 16 (ref 9–20)
BUN: 25 mg/dL — ABNORMAL HIGH (ref 6–24)
CO2: 21 mmol/L (ref 18–29)
Calcium: 9 mg/dL (ref 8.7–10.2)
Chloride: 103 mmol/L (ref 96–106)
Creatinine, Ser: 1.56 mg/dL — ABNORMAL HIGH (ref 0.76–1.27)
GFR calc Af Amer: 59 mL/min/{1.73_m2} — ABNORMAL LOW (ref >59)
GFR calc non Af Amer: 51 mL/min/{1.73_m2} — ABNORMAL LOW (ref >59)
Glucose: 133 mg/dL — ABNORMAL HIGH (ref 65–99)
Potassium: 5.1 mmol/L (ref 3.5–5.2)
Sodium: 140 mmol/L (ref 134–144)

## 2017-04-28 ENCOUNTER — Telehealth: Payer: Self-pay | Admitting: Internal Medicine

## 2017-04-28 NOTE — Telephone Encounter (Signed)
APT. REMINDER CALL, LMTCB °

## 2017-04-29 ENCOUNTER — Ambulatory Visit: Payer: Self-pay

## 2017-04-29 ENCOUNTER — Encounter: Payer: Self-pay | Admitting: Internal Medicine

## 2017-06-03 ENCOUNTER — Encounter (HOSPITAL_COMMUNITY): Payer: Self-pay | Admitting: Emergency Medicine

## 2017-06-03 ENCOUNTER — Emergency Department (HOSPITAL_COMMUNITY)
Admission: EM | Admit: 2017-06-03 | Discharge: 2017-06-03 | Disposition: A | Discharge status: Home / Self Care | Payer: Self-pay | Attending: Emergency Medicine | Admitting: Emergency Medicine

## 2017-06-03 ENCOUNTER — Emergency Department (HOSPITAL_COMMUNITY): Payer: Self-pay

## 2017-06-03 DIAGNOSIS — I110 Hypertensive heart disease with heart failure: Secondary | ICD-10-CM | POA: Insufficient documentation

## 2017-06-03 DIAGNOSIS — I2510 Atherosclerotic heart disease of native coronary artery without angina pectoris: Secondary | ICD-10-CM | POA: Insufficient documentation

## 2017-06-03 DIAGNOSIS — R05 Cough: Secondary | ICD-10-CM | POA: Insufficient documentation

## 2017-06-03 DIAGNOSIS — F17210 Nicotine dependence, cigarettes, uncomplicated: Secondary | ICD-10-CM | POA: Insufficient documentation

## 2017-06-03 DIAGNOSIS — I5043 Acute on chronic combined systolic (congestive) and diastolic (congestive) heart failure: Secondary | ICD-10-CM | POA: Insufficient documentation

## 2017-06-03 DIAGNOSIS — R059 Cough, unspecified: Secondary | ICD-10-CM

## 2017-06-03 DIAGNOSIS — Z955 Presence of coronary angioplasty implant and graft: Secondary | ICD-10-CM | POA: Insufficient documentation

## 2017-06-03 DIAGNOSIS — I252 Old myocardial infarction: Secondary | ICD-10-CM | POA: Insufficient documentation

## 2017-06-03 DIAGNOSIS — Z7982 Long term (current) use of aspirin: Secondary | ICD-10-CM | POA: Insufficient documentation

## 2017-06-03 DIAGNOSIS — Z79899 Other long term (current) drug therapy: Secondary | ICD-10-CM | POA: Insufficient documentation

## 2017-06-03 MED ORDER — AMOXICILLIN 500 MG PO CAPS: 500.0000 mg | ORAL_CAPSULE | Freq: Three times a day (TID) | ORAL | 0 refills | Status: AC

## 2017-06-03 MED ORDER — FUROSEMIDE 20 MG PO TABS: 60.0000 mg | ORAL_TABLET | Freq: Every day | ORAL | 0 refills | Status: DC

## 2017-06-03 MED ORDER — ALBUTEROL SULFATE (2.5 MG/3ML) 0.083% IN NEBU
5.0000 mg | INHALATION_SOLUTION | Freq: Once | RESPIRATORY_TRACT | Status: AC
  Administered 2017-06-03: 5 mg via RESPIRATORY_TRACT
  Filled 2017-06-03: qty 6

## 2017-06-03 NOTE — ED Triage Notes (Signed)
Patient here from home with complaints of cough with blood tinged sputum for 3 days. Difficulty sleeping at night due to cough. Hx of CHF.

## 2017-06-03 NOTE — Discharge Instructions (Signed)
You have been seen today for a productive cough. Bronchitis is suspected. Please take all of your antibiotics until finished!   You may develop abdominal discomfort or diarrhea from the antibiotic.  You may help offset this with probiotics which you can buy or get in yogurt. Do not eat or take the probiotics until 2 hours after your antibiotic.  Continue to use the Mucinex. Be sure to take your Lasix and other medications as prescribed. Follow-up with your primary care provider on this matter. Follow up with your cardiologist. Return to the ED if symptoms worsen.

## 2017-06-03 NOTE — ED Provider Notes (Signed)
[facility] DEPT Provider Note   CSN: [id number] Arrival date & time: 06/03/17  1410     History   Chief Complaint Chief Complaint  Patient presents with  . Cough  . Hemoptysis  . Shortness of Breath    HPI Tyler Wong is a 51 y.o. male.  HPI    Tyler Wong is a 51 y.o. male, with a history of CAD, cocaine abuse,, MI, and HTN, presenting to the ED with productive cough for last four days. Improvement with Mucinex. Patient endorses some small flecks of blood intermittently in his sputum. Frequent cocaine use, last use yesterday.  Is supposed to be taking 60mg  lasix daily, but has not been compliant. Was diagnosed with heart failure in January 2018 with EF 30-35%.  Denies CP, SOB, fever/chills, peripheral edema, N/V/D, or any other complaints.    Past Medical History:  Diagnosis Date  . CAD in native artery 07/17/14   STEMI- LAD stenosis with DES  . Cocaine abuse   . Dyspnea   . Hypertension   . ST elevation myocardial infarction (STEMI) involving left anterior descending (LAD) coronary artery with complication (Home)   . Tobacco abuse 07/17/2014    Patient Active Problem List   Diagnosis Date Noted  . Hepatitis C 04/13/2017  . Current non-adherence to medical treatment 04/13/2017  . Acute on chronic combined systolic (congestive) and diastolic (congestive) heart failure ([place]) 04/13/2017  . HTN (hypertension) 04/12/2017  . CAP (community acquired pneumonia) 01/24/2017  . Acute exacerbation of congestive heart failure ([place]) 01/24/2017  . CHF exacerbation ([place]) 01/24/2017  . Influenza A 01/24/2017  . Insomnia 01/18/2017  . Chronic combined systolic and diastolic congestive heart failure ([place]) 01/04/2017  . AKI (acute kidney injury) ([place]) 01/04/2017  . Hyperlipidemia 01/04/2017  . Chest pain 12/30/2016  . CAD S/P percutaneous coronary angioplasty 07/18/2014  . Tobacco abuse 07/17/2014  . Cocaine use 07/17/2014    Past Surgical History:  Procedure Laterality  Date  . CORONARY ANGIOPLASTY WITH STENT PLACEMENT  07/18/14   resolute DES to LAD STEMI  . LEFT HEART CATH N/A 07/17/2014   Procedure: LEFT HEART CATH;  Surgeon: [name], MD;  Location: [facility] CATH LAB;  Service: Cardiovascular;  Laterality: N/A;  . PERCUTANEOUS CORONARY STENT INTERVENTION (PCI-S)  07/17/2014   Procedure: PERCUTANEOUS CORONARY STENT INTERVENTION (PCI-S);  Surgeon: [name], MD;  Location: [facility] CATH LAB;  Service: Cardiovascular;;       Home Medications    Prior to Admission medications   Medication Sig Start Date End Date Taking? Authorizing Provider  aspirin 81 MG EC tablet Take 1 tablet (81 mg total) by mouth daily. 04/13/17  Yes Holley Raring, MD  carvedilol (COREG) 3.125 MG tablet Take 1 tablet (3.125 mg total) by mouth 2 (two) times daily with a meal. 04/13/17  Yes Holley Raring, MD  furosemide (LASIX) 20 MG tablet Take 3 tablets (60 mg total) by mouth daily. 04/26/17  Yes Jule Ser, DO  lisinopril (PRINIVIL,ZESTRIL) 10 MG tablet Take 1 tablet (10 mg total) by mouth daily. 04/13/17  Yes Holley Raring, MD  Multiple Vitamin (MULTIVITAMIN) tablet Take 1 tablet by mouth daily.   Yes [provider]  nitroGLYCERIN (NITROSTAT) 0.4 MG SL tablet Place 1 tablet (0.4 mg total) under the tongue every 5 (five) minutes as needed for chest pain. 12/31/16  Yes Asencion Partridge, MD  rosuvastatin (CRESTOR) 20 MG tablet Take 1 tablet (20 mg total) by mouth daily at 6 PM. 04/13/17  Yes Holley Raring,  MD  amoxicillin (AMOXIL) 500 MG capsule Take 1 capsule (500 mg total) by mouth 3 (three) times daily. 06/03/17 06/06/17  Carigan Lister C, PA-C  furosemide (LASIX) 20 MG tablet Take 3 tablets (60 mg total) by mouth daily. 06/03/17 07/03/17  Lorayne Bender, PA-C    Family History Family History  Problem Relation Age of Onset  . Heart attack Mother   . Heart attack Sister   . Heart failure Sister   . Heart attack Brother     Social History Social History  Substance Use Topics  .  Smoking status: Current Every Day Smoker    Packs/day: 0.50    Years: 30.00    Types: Cigarettes  . Smokeless tobacco: Never Used     Comment: cutting back 2-3 per day  . Alcohol use No     Comment: Patient denies abuse, states social drinker     Allergies   Patient has no known allergies.   Review of Systems Review of Systems  Constitutional: Negative for chills and fever.  Respiratory: Positive for cough. Negative for shortness of breath.   Cardiovascular: Negative for chest pain.  Gastrointestinal: Negative for abdominal pain, diarrhea, nausea and vomiting.  Neurological: Negative for weakness.  All other systems reviewed and are negative.    Physical Exam Updated Vital Signs BP (!) 138/93 (BP Location: Right Arm)   Pulse (!) 110   Temp 97.9 F (36.6 C) (Oral)   Resp 20   Ht 5' 10.5" (1.791 m)   Wt 75.8 kg (167 lb)   SpO2 98%   BMI 23.62 kg/m   Physical Exam  Constitutional: He appears well-developed and well-nourished. No distress.  HENT:  Head: Normocephalic and atraumatic.  Eyes: Conjunctivae are normal.  Neck: Neck supple.  Cardiovascular: Normal rate, regular rhythm, normal heart sounds and intact distal pulses.   Pulmonary/Chest: Effort normal and breath sounds normal. No respiratory distress.  Abdominal: Soft. There is no tenderness. There is no guarding.  Musculoskeletal: He exhibits no edema.  Lymphadenopathy:    He has no cervical adenopathy.  Neurological: He is alert.  Skin: Skin is warm and dry. He is not diaphoretic.  Psychiatric: He has a normal mood and affect. His behavior is normal.  Nursing note and vitals reviewed.    ED Treatments / Results  Labs (all labs ordered are listed, but only abnormal results are displayed) Labs Reviewed - No data to display  EKG  EKG Interpretation  Date/Time:  Thursday June 03 2017 14:35:28 EDT Ventricular Rate:  102 PR Interval:    QRS Duration: 89 QT Interval:  379 QTC Calculation: 494 R  Axis:   -80 Text Interpretation:  Sinus tachycardia Biatrial enlargement Left anterior fascicular block Left ventricular hypertrophy Anterior Q waves, possibly due to LVH No significant change since last tracing Confirmed by Orlie Dakin [phone]) on 06/03/2017 2:42:52 PM Also confirmed by Orlie Dakin [phone]), editor Drema Pry [phone])  on 06/03/2017 2:52:48 PM       Radiology Dg Chest 2 View  Result Date: 06/03/2017 CLINICAL DATA:  Cough and blood-tinged sputum. EXAM: CHEST  2 VIEW COMPARISON:  04/12/2017 FINDINGS: Mild cardiac enlargement. Scar like density within the right upper lobe noted. The visualized skeletal structures are unremarkable. IMPRESSION: 1. Mild cardiac enlargement. 2. Right upper lobe scar Electronically Signed   By: Kerby Moors M.D.   On: 06/03/2017 14:56    Procedures Procedures (including critical care time)  Medications Ordered in ED Medications  albuterol (PROVENTIL) (2.5 MG/3ML)  0.083% nebulizer solution 5 mg (5 mg Nebulization Given 06/03/17 1603)     Initial Impression / Assessment and Plan / ED Course  I have reviewed the triage vital signs and the nursing notes.  Pertinent labs & imaging results that were available during my care of the patient were reviewed by me and considered in my medical decision making (see chart for details).     Patient presents with complaint of a cough. No acute abnormalities on chest x-ray. Patient is nontoxic appearing, afebrile, not tachycardic on my exam, not tachypneic, not hypotensive, maintains SPO2 of 97-98% on room air, and is in no apparent distress. Patient has no signs of sepsis or other serious or life-threatening condition. Doubt PE or CHF exacerbation. The patient was given instructions for home care as well as return precautions. Patient voices understanding of these instructions, accepts the plan, and is comfortable with discharge.  Findings and plan of care discussed with Jola Schmidt, MD.    Vitals:   06/03/17 1415 06/03/17 1737  BP: (!) 138/93 (!) 120/95  Pulse: (!) 110 70  Resp: 20 16  Temp: 97.9 F (36.6 C)   TempSrc: Oral   SpO2: 98% 97%  Weight: 75.8 kg (167 lb)   Height: 5' 10.5" (1.791 m)       Final Clinical Impressions(s) / ED Diagnoses   Final diagnoses:  Cough    New Prescriptions Discharge Medication List as of 06/03/2017  5:24 PM    START taking these medications   Details  amoxicillin (AMOXIL) 500 MG capsule Take 1 capsule (500 mg total) by mouth 3 (three) times daily., Starting Thu 06/03/2017, Until Sun 06/06/2017, Print    !! furosemide (LASIX) 20 MG tablet Take 3 tablets (60 mg total) by mouth daily., Starting Thu 06/03/2017, Until Sat 07/03/2017, Print     !! - Potential duplicate medications found. Please discuss with provider.       Lorayne Bender, PA-C 06/04/17 1315    Jola Schmidt, MD 06/04/17 [phone]

## 2017-06-04 MED FILL — FUROSEMIDE 20 MG TABLET: 20 | 30 days supply | Qty: 90 | Fill #0

## 2017-06-04 MED FILL — AMOXICILLIN 500 MG CAPSULE: 500 | 3 days supply | Qty: 9 | Fill #0

## 2017-07-12 ENCOUNTER — Emergency Department (HOSPITAL_COMMUNITY)
Admission: EM | Admit: 2017-07-12 | Discharge: 2017-07-12 | Disposition: A | Discharge status: Home / Self Care | Payer: Self-pay | Attending: Emergency Medicine | Admitting: Emergency Medicine

## 2017-07-12 ENCOUNTER — Encounter (HOSPITAL_COMMUNITY): Payer: Self-pay

## 2017-07-12 ENCOUNTER — Emergency Department (HOSPITAL_COMMUNITY): Payer: Self-pay

## 2017-07-12 DIAGNOSIS — J209 Acute bronchitis, unspecified: Secondary | ICD-10-CM | POA: Insufficient documentation

## 2017-07-12 DIAGNOSIS — F17210 Nicotine dependence, cigarettes, uncomplicated: Secondary | ICD-10-CM | POA: Insufficient documentation

## 2017-07-12 DIAGNOSIS — I2510 Atherosclerotic heart disease of native coronary artery without angina pectoris: Secondary | ICD-10-CM | POA: Insufficient documentation

## 2017-07-12 DIAGNOSIS — R0602 Shortness of breath: Secondary | ICD-10-CM

## 2017-07-12 DIAGNOSIS — I110 Hypertensive heart disease with heart failure: Secondary | ICD-10-CM | POA: Insufficient documentation

## 2017-07-12 DIAGNOSIS — J40 Bronchitis, not specified as acute or chronic: Secondary | ICD-10-CM

## 2017-07-12 DIAGNOSIS — Z79899 Other long term (current) drug therapy: Secondary | ICD-10-CM | POA: Insufficient documentation

## 2017-07-12 DIAGNOSIS — F1410 Cocaine abuse, uncomplicated: Secondary | ICD-10-CM | POA: Insufficient documentation

## 2017-07-12 DIAGNOSIS — Z7982 Long term (current) use of aspirin: Secondary | ICD-10-CM | POA: Insufficient documentation

## 2017-07-12 DIAGNOSIS — Z955 Presence of coronary angioplasty implant and graft: Secondary | ICD-10-CM | POA: Insufficient documentation

## 2017-07-12 DIAGNOSIS — I5042 Chronic combined systolic (congestive) and diastolic (congestive) heart failure: Secondary | ICD-10-CM | POA: Insufficient documentation

## 2017-07-12 LAB — BASIC METABOLIC PANEL
Anion gap: 8 (ref 5–15)
BUN: 27 mg/dL — ABNORMAL HIGH (ref 6–20)
CO2: 22 mmol/L (ref 22–32)
Calcium: 8.5 mg/dL — ABNORMAL LOW (ref 8.9–10.3)
Chloride: 108 mmol/L (ref 101–111)
Creatinine, Ser: 1.52 mg/dL — ABNORMAL HIGH (ref 0.61–1.24)
GFR calc Af Amer: 60 mL/min — ABNORMAL LOW (ref >60)
GFR calc non Af Amer: 51 mL/min — ABNORMAL LOW (ref >60)
Glucose, Bld: 121 mg/dL — ABNORMAL HIGH (ref 65–99)
Potassium: 3.9 mmol/L (ref 3.5–5.1)
Sodium: 138 mmol/L (ref 135–145)

## 2017-07-12 LAB — CBC WITH DIFFERENTIAL/PLATELET
Basophils Absolute: 0 10*3/uL (ref 0.0–0.1)
Basophils Relative: 1 %
Eosinophils Absolute: 0.2 10*3/uL (ref 0.0–0.7)
Eosinophils Relative: 3 %
HCT: 39.1 % (ref 39.0–52.0)
Hemoglobin: 13.1 g/dL (ref 13.0–17.0)
Lymphocytes Relative: 23 %
Lymphs Abs: 1.5 10*3/uL (ref 0.7–4.0)
MCH: 29.8 pg (ref 26.0–34.0)
MCHC: 33.5 g/dL (ref 30.0–36.0)
MCV: 89.1 fL (ref 78.0–100.0)
Monocytes Absolute: 0.5 10*3/uL (ref 0.1–1.0)
Monocytes Relative: 7 %
Neutro Abs: 4.3 10*3/uL (ref 1.7–7.7)
Neutrophils Relative %: 66 %
Platelets: 190 10*3/uL (ref 150–400)
RBC: 4.39 MIL/uL (ref 4.22–5.81)
RDW: 16 % — ABNORMAL HIGH (ref 11.5–15.5)
WBC: 6.4 10*3/uL (ref 4.0–10.5)

## 2017-07-12 LAB — HEPATIC FUNCTION PANEL
ALT: 41 U/L (ref 17–63)
AST: 32 U/L (ref 15–41)
Albumin: 2.8 g/dL — ABNORMAL LOW (ref 3.5–5.0)
Alkaline Phosphatase: 92 U/L (ref 38–126)
Bilirubin, Direct: 0.2 mg/dL (ref 0.1–0.5)
Indirect Bilirubin: 0.6 mg/dL (ref 0.3–0.9)
Total Bilirubin: 0.8 mg/dL (ref 0.3–1.2)
Total Protein: 5.8 g/dL — ABNORMAL LOW (ref 6.5–8.1)

## 2017-07-12 LAB — LIPASE, BLOOD: Lipase: 32 U/L (ref 11–51)

## 2017-07-12 LAB — I-STAT TROPONIN, ED: Troponin i, poc: 0 ng/mL (ref 0.00–0.08)

## 2017-07-12 LAB — D-DIMER, QUANTITATIVE: D-Dimer, Quant: 0.35 ug/mL-FEU (ref 0.00–0.50)

## 2017-07-12 MED ORDER — ALBUTEROL SULFATE HFA 108 (90 BASE) MCG/ACT IN AERS
2.0000 | INHALATION_SPRAY | Freq: Once | RESPIRATORY_TRACT | Status: AC
  Administered 2017-07-12: 2 via RESPIRATORY_TRACT
  Filled 2017-07-12: qty 6.7

## 2017-07-12 MED ORDER — PREDNISONE 20 MG PO TABS
60.0000 mg | ORAL_TABLET | Freq: Once | ORAL | Status: AC
  Administered 2017-07-12: 60 mg via ORAL
  Filled 2017-07-12: qty 3

## 2017-07-12 MED ORDER — PREDNISONE 20 MG PO TABS: 40.0000 mg | ORAL_TABLET | Freq: Every day | ORAL | 0 refills | Status: DC

## 2017-07-12 MED ORDER — IPRATROPIUM-ALBUTEROL 0.5-2.5 (3) MG/3ML IN SOLN
3.0000 mL | Freq: Once | RESPIRATORY_TRACT | Status: AC
  Administered 2017-07-12: 3 mL via RESPIRATORY_TRACT
  Filled 2017-07-12: qty 3

## 2017-07-12 MED ORDER — ALBUTEROL SULFATE HFA 108 (90 BASE) MCG/ACT IN AERS: 2.0000 | INHALATION_SPRAY | RESPIRATORY_TRACT | 0 refills | Status: DC | PRN

## 2017-07-12 NOTE — ED Provider Notes (Signed)
[facility] DEPT Provider Note   CSN: [id number] Arrival date & time: 07/12/17  0107   By signing my name below, I, Eunice Blase, attest that this documentation has been prepared under the direction and in the presence of Anquanette Bahner, Barbette Hair, MD. Electronically signed, Eunice Blase, ED Scribe. 07/12/17. 2:01 AM.  History   Chief Complaint Chief Complaint  Patient presents with  . Respiratory Distress   The history is provided by the patient and medical records. No language interpreter was used.    Tyler Wong is a 51 y.o. male with h/o CAD, STEMI and polysubstance abuse presenting to the Emergency Department concerning SOB x 3-4 hours prior to evaluation. He describes difficulty taking deep breaths, associated foot swelling, cough productive of grey/ yellow sputum, nausea and abdominal pain. He describes mild to moderate painful tightness in the abdomen. No inhaler use at home currently and historically. Tobacco use noted. H/o heart attack and stent placement noted. Pt taking lisinopril, ASA and carvedilol regularly at home. No chest or vomiting. No other complaints at this time.   Past Medical History:  Diagnosis Date  . CAD in native artery 07/17/14   STEMI- LAD stenosis with DES  . Cocaine abuse   . Dyspnea   . Hypertension   . ST elevation myocardial infarction (STEMI) involving left anterior descending (LAD) coronary artery with complication ([place])   . Tobacco abuse 07/17/2014    Patient Active Problem List   Diagnosis Date Noted  . Hepatitis C 04/13/2017  . Current non-adherence to medical treatment 04/13/2017  . Acute on chronic combined systolic (congestive) and diastolic (congestive) heart failure ([place]) 04/13/2017  . HTN (hypertension) 04/12/2017  . CAP (community acquired pneumonia) 01/24/2017  . Acute exacerbation of congestive heart failure ([place]) 01/24/2017  . CHF exacerbation ([place]) 01/24/2017  . Influenza A 01/24/2017  . Insomnia 01/18/2017  . Chronic  combined systolic and diastolic congestive heart failure ([place]) 01/04/2017  . AKI (acute kidney injury) ([place]) 01/04/2017  . Hyperlipidemia 01/04/2017  . Chest pain 12/30/2016  . CAD S/P percutaneous coronary angioplasty 07/18/2014  . Tobacco abuse 07/17/2014  . Cocaine use 07/17/2014    Past Surgical History:  Procedure Laterality Date  . CORONARY ANGIOPLASTY WITH STENT PLACEMENT  07/18/14   resolute DES to LAD STEMI  . LEFT HEART CATH N/A 07/17/2014   Procedure: LEFT HEART CATH;  Surgeon: [name], MD;  Location: [facility] CATH LAB;  Service: Cardiovascular;  Laterality: N/A;  . PERCUTANEOUS CORONARY STENT INTERVENTION (PCI-S)  07/17/2014   Procedure: PERCUTANEOUS CORONARY STENT INTERVENTION (PCI-S);  Surgeon: [name], MD;  Location: [facility] CATH LAB;  Service: Cardiovascular;;       Home Medications    Prior to Admission medications   Medication Sig Start Date End Date Taking? Authorizing Provider  aspirin 81 MG EC tablet Take 1 tablet (81 mg total) by mouth daily. 04/13/17  Yes Holley Raring, MD  carvedilol (COREG) 3.125 MG tablet Take 1 tablet (3.125 mg total) by mouth 2 (two) times daily with a meal. 04/13/17  Yes Holley Raring, MD  furosemide (LASIX) 20 MG tablet Take 3 tablets (60 mg total) by mouth daily. 06/03/17 07/12/17 Yes Joy, Shawn C, PA-C  HYDROCODONE-ACETAMINOPHEN PO Take 1 tablet by mouth once.   Yes [provider]  lisinopril (PRINIVIL,ZESTRIL) 10 MG tablet Take 1 tablet (10 mg total) by mouth daily. 04/13/17  Yes Holley Raring, MD  Multiple Vitamin (MULTIVITAMIN) tablet Take 1 tablet by mouth daily.   Yes [provider]  nitroGLYCERIN (NITROSTAT) 0.4 MG SL tablet Place 1 tablet (0.4 mg total) under the tongue every 5 (five) minutes as needed for chest pain. 12/31/16  Yes Asencion Partridge, MD  rosuvastatin (CRESTOR) 20 MG tablet Take 1 tablet (20 mg total) by mouth daily at 6 PM. 04/13/17  Yes Holley Raring, MD  albuterol (PROVENTIL HFA;VENTOLIN HFA) 108  (90 Base) MCG/ACT inhaler Inhale 2 puffs into the lungs every 4 (four) hours as needed for wheezing or shortness of breath. 07/12/17   Thijs Brunton, Barbette Hair, MD  furosemide (LASIX) 20 MG tablet Take 3 tablets (60 mg total) by mouth daily. Patient not taking: Reported on 07/12/2017 04/26/17   Jule Ser, DO  predniSONE (DELTASONE) 20 MG tablet Take 2 tablets (40 mg total) by mouth daily. 07/12/17   Telisa Ohlsen, Barbette Hair, MD    Family History Family History  Problem Relation Age of Onset  . Heart attack Mother   . Heart attack Sister   . Heart failure Sister   . Heart attack Brother     Social History Social History  Substance Use Topics  . Smoking status: Current Every Day Smoker    Packs/day: 0.50    Years: 30.00    Types: Cigarettes  . Smokeless tobacco: Never Used     Comment: cutting back 2-3 per day  . Alcohol use No     Comment: Patient denies abuse, states social drinker     Allergies   Patient has no known allergies.   Review of Systems Review of Systems  Constitutional: Negative for fever.  Respiratory: Positive for cough and shortness of breath.   Cardiovascular: Positive for leg swelling. Negative for chest pain.  Gastrointestinal: Positive for abdominal pain and nausea. Negative for vomiting.  All other systems reviewed and are negative.    Physical Exam Updated Vital Signs BP (!) 110/95 (BP Location: Left Arm)   Pulse (!) 106   Temp (!) 97.4 F (36.3 C) (Oral)   Resp (!) 26   Ht 5\' 10"  (1.778 m)   Wt 168 lb (76.2 kg)   SpO2 100%   BMI 24.11 kg/m   Physical Exam  Constitutional: He is oriented to person, place, and time. He appears well-developed and well-nourished. No distress.  HENT:  Head: Normocephalic and atraumatic.  Cardiovascular: Normal rate, regular rhythm and normal heart sounds.   No murmur heard. Pulmonary/Chest: Effort normal. No respiratory distress. He has wheezes.  Abdominal: Soft. Bowel sounds are normal. He exhibits no  distension. There is no tenderness. There is no rebound.  Musculoskeletal: He exhibits no edema.  Neurological: He is alert and oriented to person, place, and time.  Skin: Skin is warm and dry.  Psychiatric: He has a normal mood and affect.  Nursing note and vitals reviewed.    ED Treatments / Results  DIAGNOSTIC STUDIES: Oxygen Saturation is 100% on RA, NL by my interpretation.    COORDINATION OF CARE: 1:47 AM-Discussed next steps with pt. Pt verbalized understanding and is agreeable with the plan.   Labs (all labs ordered are listed, but only abnormal results are displayed) Labs Reviewed  CBC WITH DIFFERENTIAL/PLATELET - Abnormal; Notable for the following:       Result Value   RDW 16.0 (*)    All other components within normal limits  BASIC METABOLIC PANEL - Abnormal; Notable for the following:    Glucose, Bld 121 (*)    BUN 27 (*)    Creatinine, Ser 1.52 (*)    Calcium  8.5 (*)    GFR calc non Af Amer 51 (*)    GFR calc Af Amer 60 (*)    All other components within normal limits  HEPATIC FUNCTION PANEL - Abnormal; Notable for the following:    Total Protein 5.8 (*)    Albumin 2.8 (*)    All other components within normal limits  D-DIMER, QUANTITATIVE (NOT AT [facility])  LIPASE, BLOOD  I-STAT TROPOININ, ED    EKG  EKG Interpretation  Date/Time:  Monday July 12 2017 01:11:56 EDT Ventricular Rate:  106 PR Interval:  160 QRS Duration: 82 QT Interval:  384 QTC Calculation: 510 R Axis:   -60 Text Interpretation:  Sinus tachycardia Right atrial enlargement Left anterior fascicular block Anterolateral infarct , age undetermined Abnormal ECG No significant change since last tracing Confirmed by Thayer Jew [phone]) on 07/12/2017 1:17:37 AM       Radiology Dg Chest 2 View  Result Date: 07/12/2017 CLINICAL DATA:  Dyspnea for several hours EXAM: CHEST  2 VIEW COMPARISON:  CXR from 06/03/2017 and chest CT from 04/13/2019 FINDINGS: Cardiomegaly is again noted with  scarring in the right upper lobe. No pneumonic consolidation, effusion or pneumothorax. Aortic atherosclerosis without aneurysm is identified. No acute nor suspicious osseous abnormality. IMPRESSION: Cardiomegaly with aortic atherosclerosis. Right upper lobe scarring. Electronically Signed   By: Ashley Royalty M.D.   On: 07/12/2017 02:00    Procedures Procedures (including critical care time)  Medications Ordered in ED Medications  ipratropium-albuterol (DUONEB) 0.5-2.5 (3) MG/3ML nebulizer solution 3 mL (3 mLs Nebulization Given 07/12/17 0225)  predniSONE (DELTASONE) tablet 60 mg (60 mg Oral Given 07/12/17 0350)  albuterol (PROVENTIL HFA;VENTOLIN HFA) 108 (90 Base) MCG/ACT inhaler 2 puff (2 puffs Inhalation Given 07/12/17 0351)     Initial Impression / Assessment and Plan / ED Course  I have reviewed the triage vital signs and the nursing notes.  Pertinent labs & imaging results that were available during my care of the patient were reviewed by me and considered in my medical decision making (see chart for details).     Patient presents with shortness of breath. History of coronary artery disease and cocaine abuse. He is wheezing on exam. No signs of volume overloaded. Basic labwork obtained. EKG shows no evidence of acute ischemia. Troponin is negative. He has a history of smoking but no history of COPD. Given wheezing, will give a duo neb. Patient did report some abdominal fullness. Screening lab work is reassuring. Exam is benign. On recheck, patient states he feels much better after a duo neb. He ambulates and maintains pulse ox 98%. Will treat for bronchitis with prednisone and albuterol.  After history, exam, and medical workup I feel the patient has been appropriately medically screened and is safe for discharge home. Pertinent diagnoses were discussed with the patient. Patient was given return precautions.   Final Clinical Impressions(s) / ED Diagnoses   Final diagnoses:  Shortness of  breath  Bronchitis    New Prescriptions New Prescriptions   ALBUTEROL (PROVENTIL HFA;VENTOLIN HFA) 108 (90 BASE) MCG/ACT INHALER    Inhale 2 puffs into the lungs every 4 (four) hours as needed for wheezing or shortness of breath.   PREDNISONE (DELTASONE) 20 MG TABLET    Take 2 tablets (40 mg total) by mouth daily.  I personally performed the services described in this documentation, which was scribed in my presence. The recorded information has been reviewed and is accurate.    Merryl Hacker, MD 07/12/17 [phone]

## 2017-07-12 NOTE — ED Triage Notes (Signed)
Pt reports SOB x several hours. PT states  "i just cant catch my breath". PT is tachypenic in triage NAD. He reports a productive cough producing grey/yellow sputum

## 2017-07-12 NOTE — Discharge Instructions (Signed)
You were seen today for shortness of breath. You are wheezing on exam. He may have some bronchitis. Take prednisone as instructed. You can usually inhaler every 4 hours. If you have any new or worsening symptoms he should be reevaluated.

## 2017-07-12 NOTE — ED Notes (Signed)
Pt ambulated in hallway, with no difficulty noted. SpO2 remained at or above 98% throughout.

## 2017-07-12 NOTE — ED Notes (Signed)
Pt departed in NAD, refused use of wheelchair.  

## 2017-07-26 ENCOUNTER — Other Ambulatory Visit: Payer: Self-pay | Admitting: *Deleted

## 2017-07-28 MED ORDER — FUROSEMIDE 20 MG PO TABS
60.0000 mg | ORAL_TABLET | Freq: Every day | ORAL | 0 refills | Status: DC
Start: 2017-07-28 — End: 2017-08-03

## 2017-07-28 MED FILL — FUROSEMIDE 20 MG TABLET: 20 | 30 days supply | Qty: 90 | Fill #0

## 2017-08-03 ENCOUNTER — Emergency Department (HOSPITAL_COMMUNITY): Payer: Self-pay

## 2017-08-03 ENCOUNTER — Encounter (HOSPITAL_COMMUNITY): Payer: Self-pay | Admitting: *Deleted

## 2017-08-03 ENCOUNTER — Emergency Department (HOSPITAL_COMMUNITY)
Admission: EM | Admit: 2017-08-03 | Discharge: 2017-08-03 | Disposition: A | Discharge status: Home / Self Care | Payer: Self-pay | Attending: Emergency Medicine | Admitting: Emergency Medicine

## 2017-08-03 ENCOUNTER — Other Ambulatory Visit: Payer: Self-pay

## 2017-08-03 DIAGNOSIS — F17210 Nicotine dependence, cigarettes, uncomplicated: Secondary | ICD-10-CM | POA: Insufficient documentation

## 2017-08-03 DIAGNOSIS — E785 Hyperlipidemia, unspecified: Secondary | ICD-10-CM | POA: Insufficient documentation

## 2017-08-03 DIAGNOSIS — I2510 Atherosclerotic heart disease of native coronary artery without angina pectoris: Secondary | ICD-10-CM | POA: Insufficient documentation

## 2017-08-03 DIAGNOSIS — I10 Essential (primary) hypertension: Secondary | ICD-10-CM | POA: Insufficient documentation

## 2017-08-03 DIAGNOSIS — I5042 Chronic combined systolic (congestive) and diastolic (congestive) heart failure: Secondary | ICD-10-CM | POA: Insufficient documentation

## 2017-08-03 DIAGNOSIS — Z7982 Long term (current) use of aspirin: Secondary | ICD-10-CM | POA: Insufficient documentation

## 2017-08-03 DIAGNOSIS — Z79899 Other long term (current) drug therapy: Secondary | ICD-10-CM | POA: Insufficient documentation

## 2017-08-03 DIAGNOSIS — I509 Heart failure, unspecified: Secondary | ICD-10-CM

## 2017-08-03 DIAGNOSIS — I252 Old myocardial infarction: Secondary | ICD-10-CM | POA: Insufficient documentation

## 2017-08-03 LAB — BASIC METABOLIC PANEL
Anion gap: 7 (ref 5–15)
BUN: 25 mg/dL — ABNORMAL HIGH (ref 6–20)
CO2: 26 mmol/L (ref 22–32)
Calcium: 8.5 mg/dL — ABNORMAL LOW (ref 8.9–10.3)
Chloride: 106 mmol/L (ref 101–111)
Creatinine, Ser: 1.51 mg/dL — ABNORMAL HIGH (ref 0.61–1.24)
GFR calc Af Amer: >60 mL/min (ref >60)
GFR calc non Af Amer: 52 mL/min — ABNORMAL LOW (ref >60)
Glucose, Bld: 153 mg/dL — ABNORMAL HIGH (ref 65–99)
Potassium: 3.5 mmol/L (ref 3.5–5.1)
Sodium: 139 mmol/L (ref 135–145)

## 2017-08-03 LAB — CBC
HCT: 40.4 % (ref 39.0–52.0)
Hemoglobin: 13.5 g/dL (ref 13.0–17.0)
MCH: 29.8 pg (ref 26.0–34.0)
MCHC: 33.4 g/dL (ref 30.0–36.0)
MCV: 89.2 fL (ref 78.0–100.0)
Platelets: 257 10*3/uL (ref 150–400)
RBC: 4.53 MIL/uL (ref 4.22–5.81)
RDW: 15.8 % — ABNORMAL HIGH (ref 11.5–15.5)
WBC: 8.6 10*3/uL (ref 4.0–10.5)

## 2017-08-03 LAB — I-STAT TROPONIN, ED: Troponin i, poc: 0.03 ng/mL (ref 0.00–0.08)

## 2017-08-03 MED ORDER — FUROSEMIDE 10 MG/ML IJ SOLN
80.0000 mg | Freq: Once | INTRAMUSCULAR | Status: AC
  Administered 2017-08-03: 80 mg via INTRAVENOUS
  Filled 2017-08-03: qty 8

## 2017-08-03 MED ORDER — FUROSEMIDE 20 MG PO TABS: 60.0000 mg | ORAL_TABLET | Freq: Every day | ORAL | 0 refills | Status: DC

## 2017-08-03 NOTE — ED Notes (Signed)
ED Provider at bedside. 

## 2017-08-03 NOTE — ED Notes (Addendum)
Ambulated pt in hallway pt states states slight sob.pt has steady gait denies dizziness or other issues. HR 113 02%97 RA.

## 2017-08-03 NOTE — Discharge Planning (Signed)
Chelsia Serres J. Clydene Laming, RN, BSN, [place]  [facility] set up appointment with Domenica Fail, PA-C at [place] on 8/28@2 :00.  Spoke with pt at bedside and advised to please arrive 15 min early and take a picture ID and your current medications.  Pt verbalizes understanding of keeping appointment.

## 2017-08-03 NOTE — ED Triage Notes (Signed)
Pt arrives ambulatory, with c/o sob for several days. States he is out of his Lasix. Reports swelling in his legs. He has been using his inhaler for bronchitis. He states a "slight discomfort" in his left chest. Hx of cardiac stent.

## 2017-08-03 NOTE — ED Provider Notes (Signed)
[facility] DEPT Provider Note   CSN: [id number] Arrival date & time: 08/03/17  0454     History   Chief Complaint Chief Complaint  Patient presents with  . Shortness of Breath    HPI Tyler Wong is a 51 y.o. male.  Pt presents to the ED today with sob.  The pt has been out of his lasix for 8 days.  The pt has been using his inhaler, but it has not been helping.  The pt denies any f/c.  He had a little cp, but it is gone now.      Past Medical History:  Diagnosis Date  . CAD in native artery 07/17/14   STEMI- LAD stenosis with DES  . Cocaine abuse   . Dyspnea   . Hypertension   . ST elevation myocardial infarction (STEMI) involving left anterior descending (LAD) coronary artery with complication ([place])   . Tobacco abuse 07/17/2014    Patient Active Problem List   Diagnosis Date Noted  . Hepatitis C 04/13/2017  . Current non-adherence to medical treatment 04/13/2017  . Acute on chronic combined systolic (congestive) and diastolic (congestive) heart failure ([place]) 04/13/2017  . HTN (hypertension) 04/12/2017  . CAP (community acquired pneumonia) 01/24/2017  . Acute exacerbation of congestive heart failure ([place]) 01/24/2017  . CHF exacerbation ([place]) 01/24/2017  . Influenza A 01/24/2017  . Insomnia 01/18/2017  . Chronic combined systolic and diastolic congestive heart failure ([place]) 01/04/2017  . AKI (acute kidney injury) ([place]) 01/04/2017  . Hyperlipidemia 01/04/2017  . Chest pain 12/30/2016  . CAD S/P percutaneous coronary angioplasty 07/18/2014  . Tobacco abuse 07/17/2014  . Cocaine use 07/17/2014    Past Surgical History:  Procedure Laterality Date  . CORONARY ANGIOPLASTY WITH STENT PLACEMENT  07/18/14   resolute DES to LAD STEMI  . LEFT HEART CATH N/A 07/17/2014   Procedure: LEFT HEART CATH;  Surgeon: [name], MD;  Location: [facility] CATH LAB;  Service: Cardiovascular;  Laterality: N/A;  . PERCUTANEOUS CORONARY STENT INTERVENTION (PCI-S)  07/17/2014   Procedure: PERCUTANEOUS CORONARY STENT INTERVENTION (PCI-S);  Surgeon: [name], MD;  Location: [facility] CATH LAB;  Service: Cardiovascular;;       Home Medications    Prior to Admission medications   Medication Sig Start Date End Date Taking? Authorizing Provider  albuterol (PROVENTIL HFA;VENTOLIN HFA) 108 (90 Base) MCG/ACT inhaler Inhale 2 puffs into the lungs every 4 (four) hours as needed for wheezing or shortness of breath. 07/12/17   Horton, Barbette Hair, MD  aspirin 81 MG EC tablet Take 1 tablet (81 mg total) by mouth daily. 04/13/17   Holley Raring, MD  carvedilol (COREG) 3.125 MG tablet Take 1 tablet (3.125 mg total) by mouth 2 (two) times daily with a meal. 04/13/17   Holley Raring, MD  furosemide (LASIX) 20 MG tablet Take 3 tablets (60 mg total) by mouth daily. 08/03/17 09/02/17  Isla Pence, MD  HYDROCODONE-ACETAMINOPHEN PO Take 1 tablet by mouth once.    [provider]  lisinopril (PRINIVIL,ZESTRIL) 10 MG tablet Take 1 tablet (10 mg total) by mouth daily. 04/13/17   Holley Raring, MD  Multiple Vitamin (MULTIVITAMIN) tablet Take 1 tablet by mouth daily.    [provider]  nitroGLYCERIN (NITROSTAT) 0.4 MG SL tablet Place 1 tablet (0.4 mg total) under the tongue every 5 (five) minutes as needed for chest pain. 12/31/16   Asencion Partridge, MD  predniSONE (DELTASONE) 20 MG tablet Take 2 tablets (40 mg total) by mouth daily. 07/12/17  Horton, Barbette Hair, MD  rosuvastatin (CRESTOR) 20 MG tablet Take 1 tablet (20 mg total) by mouth daily at 6 PM. 04/13/17   Holley Raring, MD    Family History Family History  Problem Relation Age of Onset  . Heart attack Mother   . Heart attack Sister   . Heart failure Sister   . Heart attack Brother     Social History Social History  Substance Use Topics  . Smoking status: Current Every Day Smoker    Packs/day: 0.50    Years: 30.00    Types: Cigarettes  . Smokeless tobacco: Never Used     Comment: cutting back 2-3 per day  .  Alcohol use No     Comment: Patient denies abuse, states social drinker     Allergies   Patient has no known allergies.   Review of Systems Review of Systems  Respiratory: Positive for shortness of breath.   Cardiovascular: Positive for chest pain.  All other systems reviewed and are negative.    Physical Exam Updated Vital Signs BP (!) 131/106 (BP Location: Right Arm)   Pulse (!) 115   Temp (!) 97.5 F (36.4 C) (Oral)   Resp (!) 22   SpO2 100%   Physical Exam  Constitutional: He is oriented to person, place, and time. He appears well-developed and well-nourished.  HENT:  Head: Normocephalic and atraumatic.  Right Ear: External ear normal.  Left Ear: External ear normal.  Nose: Nose normal.  Mouth/Throat: Oropharynx is clear and moist.  Eyes: Pupils are equal, round, and reactive to light. Conjunctivae and EOM are normal.  Neck: Normal range of motion. Neck supple.  Cardiovascular: Tachycardia present.   Pulmonary/Chest: Tachypnea noted. He has rales.  Abdominal: Soft. Bowel sounds are normal.  Musculoskeletal: Normal range of motion.  Neurological: He is alert and oriented to person, place, and time.  Skin: Skin is warm.  Psychiatric: He has a normal mood and affect. His behavior is normal. Judgment and thought content normal.  Nursing note and vitals reviewed.    ED Treatments / Results  Labs (all labs ordered are listed, but only abnormal results are displayed) Labs Reviewed  BASIC METABOLIC PANEL - Abnormal; Notable for the following:       Result Value   Glucose, Bld 153 (*)    BUN 25 (*)    Creatinine, Ser 1.51 (*)    Calcium 8.5 (*)    GFR calc non Af Amer 52 (*)    All other components within normal limits  CBC - Abnormal; Notable for the following:    RDW 15.8 (*)    All other components within normal limits  I-STAT TROPONIN, ED    EKG  EKG Interpretation None       Radiology Dg Chest 2 View  Result Date: 08/03/2017 CLINICAL DATA:   Initial evaluation for acute cough, shortness of breath. EXAM: CHEST  2 VIEW COMPARISON:  Prior radiograph from 07/12/2017. FINDINGS: Moderate to advanced cardiomegaly, stable. Mediastinal silhouette normal. Lungs mildly hypoinflated. Diffuse vascular congestion with interstitial prominence, compatible pulmonary edema. Small bilateral pleural effusions. Probable superimposed bibasilar atelectasis. Right upper lobe linear scarring noted. No focal infiltrates. No pneumothorax. No acute osseus abnormality. IMPRESSION: Cardiomegaly with mild to moderate pulmonary edema and small bilateral pleural effusions, suggesting CHF. Electronically Signed   By: Jeannine Boga M.D.   On: 08/03/2017 05:37    Procedures Procedures (including critical care time)  Medications Ordered in ED Medications  furosemide (LASIX) injection 80  mg (80 mg Intravenous Given 08/03/17 0748)     Initial Impression / Assessment and Plan / ED Course  I have reviewed the triage vital signs and the nursing notes.  Pertinent labs & imaging results that were available during my care of the patient were reviewed by me and considered in my medical decision making (see chart for details).    Pt does not have a pcp and needs one.  He has been coming here for medication refills.  I spoke with case management who set pt up with Domenica Fail, a PA at [facility] on 8/28.  Pt knows to keep apt.    Final Clinical Impressions(s) / ED Diagnoses   Final diagnoses:  Acute on chronic congestive heart failure, unspecified heart failure type [facility])    New Prescriptions Current Discharge Medication List       Isla Pence, MD 08/03/17 [phone]

## 2017-08-17 ENCOUNTER — Telehealth: Payer: Self-pay

## 2017-08-17 ENCOUNTER — Ambulatory Visit (INDEPENDENT_AMBULATORY_CARE_PROVIDER_SITE_OTHER): Payer: Self-pay | Admitting: Internal Medicine

## 2017-08-17 ENCOUNTER — Other Ambulatory Visit: Payer: Self-pay

## 2017-08-17 VITALS — BP 105/72 | HR 95 | Temp 97.5°F | Ht 70.5 in | Wt 165.5 lb

## 2017-08-17 DIAGNOSIS — Z720 Tobacco use: Secondary | ICD-10-CM

## 2017-08-17 DIAGNOSIS — I110 Hypertensive heart disease with heart failure: Secondary | ICD-10-CM

## 2017-08-17 DIAGNOSIS — F17200 Nicotine dependence, unspecified, uncomplicated: Secondary | ICD-10-CM

## 2017-08-17 DIAGNOSIS — I5042 Chronic combined systolic (congestive) and diastolic (congestive) heart failure: Secondary | ICD-10-CM

## 2017-08-17 MED ORDER — NITROGLYCERIN 0.4 MG SL SUBL: 0.4000 mg | SUBLINGUAL_TABLET | SUBLINGUAL | 3 refills | Status: DC | PRN

## 2017-08-17 MED ORDER — CARVEDILOL 3.125 MG PO TABS: 3.1250 mg | ORAL_TABLET | Freq: Two times a day (BID) | ORAL | 0 refills | Status: DC

## 2017-08-17 MED ORDER — ALBUTEROL SULFATE HFA 108 (90 BASE) MCG/ACT IN AERS: 2.0000 | INHALATION_SPRAY | RESPIRATORY_TRACT | 0 refills | Status: DC | PRN

## 2017-08-17 MED ORDER — FUROSEMIDE 20 MG PO TABS: 80.0000 mg | ORAL_TABLET | Freq: Every day | ORAL | 2 refills | Status: DC

## 2017-08-17 MED ORDER — LISINOPRIL 10 MG PO TABS: 10.0000 mg | ORAL_TABLET | Freq: Every day | ORAL | 2 refills | Status: DC

## 2017-08-17 MED FILL — CARVEDILOL 3.125 MG TABLET: 3.125 | 30 days supply | Qty: 60 | Fill #0

## 2017-08-17 MED FILL — FUROSEMIDE 20 MG TABLET: 20 | 30 days supply | Qty: 120 | Fill #0

## 2017-08-17 MED FILL — LISINOPRIL 10 MG TABLET: 10 | 30 days supply | Qty: 30 | Fill #0

## 2017-08-17 NOTE — Progress Notes (Signed)
   CC: Shortness of Breath   HPI:  [name] is a 51 y.o. male with a PMHx significant for HFrEF with an EF of 30-35% on his most recent echo presenting with a 1 day history of SOA. He recently ran out of his Lasix 8 days prior and since has had worsening orthopnea. At baseline he is able to sleep flat. He has also noticed increased SOA with chores around that house which is changed from baseline. He feels like the Lasix 60 mg is not working as it should and most days he takes 80-100 mg, which is why he ran out so fast. He does not take his Coreg or Lisinopril on a regular basis as he feels well without them. Fails to follow a fluid restriction and drinks 4L of fluids a day. Limits his salt intake. Does not weight himself daily. Has not seen his cardiologist (Dr. Claiborne Billings) in 3 years.   Past Medical History:  Diagnosis Date  . CAD in native artery 07/17/14   STEMI- LAD stenosis with DES  . Cocaine abuse   . Dyspnea   . Hypertension   . ST elevation myocardial infarction (STEMI) involving left anterior descending (LAD) coronary artery with complication ([place])   . Tobacco abuse 07/17/2014   Review of Systems:   Endorses orthopnea, SOA, and increased cough Denies PND, LE swelling, chest pain, palpitations, weight gain, abdominal distention, and abdominal pain  Physical Exam: Vitals:   08/17/17 1344  BP: 105/72  Pulse: 95  Temp: (!) 97.5 F (36.4 C)  TempSrc: Oral  SpO2: 99%  Weight: 165 lb 8 oz (75.1 kg)  Height: 5' 10.5" (1.791 m)   Physical Exam  Constitutional: He is oriented to person, place, and time. He appears well-developed and well-nourished.  HENT:  Head: Normocephalic and atraumatic.  Eyes: Pupils are equal, round, and reactive to light. Conjunctivae are normal.  Cardiovascular: Normal rate, regular rhythm and intact distal pulses.   Systolic murmur, no JVD  Pulmonary/Chest: Effort normal and breath sounds normal.  No crackles  Abdominal: Soft. Bowel sounds are  normal. He exhibits no distension. There is no tenderness.  No hepatojugular reflex  Musculoskeletal: He exhibits no edema.  Neurological: He is alert and oriented to person, place, and time.  Skin: Skin is warm and dry. Capillary refill takes less than 2 seconds.   Assessment & Plan:   See Encounters Tab for problem based charting.  Patient seen with Dr. Daryll Drown

## 2017-08-17 NOTE — Assessment & Plan Note (Addendum)
Has not been taking his Lisinopril or Coreg regularly. Feels the 60 mg Lasix is not working so he has increased his home dose, as a result he ran out. Since he has had worsening SOA, orthopnea, and cough. Denies further cocaine use. Does not follow a fluid restriction. Limits his salt intake.   Plan: - Increased Furosemide to 80mg  QD - Continue Lisinopril and Coreg - Encouraged daily weight checks  - We discussed the need to take all his medications properly and limit his fluid intake.

## 2017-08-17 NOTE — Telephone Encounter (Signed)
furosemide (LASIX) 20 MG tablet, refill request.

## 2017-08-17 NOTE — Patient Instructions (Addendum)
It was a pleasure to meet you today. Please begin taking all your medications daily.   - START taking your Coreg and Lisinopril daily   - START taking Lasix 80 mg daily  - Cut back on how much water and soda you drink per day   - Please weigh yourself daily and if you notice your weight begins to increase call us.   - Continue to limit your salt intake.   - Dr. Claiborne Billings, your heart doctor, will contact you to schedule an appointment  Heart Failure Heart failure means your heart has trouble pumping blood. This makes it hard for your body to work well. Heart failure is usually a long-term (chronic) condition. You must take good care of yourself and follow your doctor's treatment plan. Follow these instructions at home:  Take your heart medicine as told by your doctor. ? Do not stop taking medicine unless your doctor tells you to. ? Do not skip any dose of medicine. ? Refill your medicines before they run out. ? Take other medicines only as told by your doctor or pharmacist.  Stay active if told by your doctor. The elderly and people with severe heart failure should talk with a doctor about physical activity.  Eat heart-healthy foods. Choose foods that are without trans fat and are low in saturated fat, cholesterol, and salt (sodium). This includes fresh or frozen fruits and vegetables, fish, lean meats, fat-free or low-fat dairy foods, whole grains, and high-fiber foods. Lentils and dried peas and beans (legumes) are also good choices.  Limit salt if told by your doctor.  Cook in a healthy way. Roast, grill, broil, bake, poach, steam, or stir-fry foods.  Limit fluids as told by your doctor.  Weigh yourself every morning. Do this after you pee (urinate) and before you eat breakfast. Write down your weight to give to your doctor.  Take your blood pressure and write it down if your doctor tells you to.  Ask your doctor how to check your pulse. Check your pulse as told.  Lose weight if  told by your doctor.  Stop smoking or chewing tobacco. Do not use gum or patches that help you quit without your doctor's approval.  Schedule and go to doctor visits as told.  Nonpregnant women should have no more than 1 drink a day. Men should have no more than 2 drinks a day. Talk to your doctor about drinking alcohol.  Stop illegal drug use.  Stay current with shots (immunizations).  Manage your health conditions as told by your doctor.  Learn to manage your stress.  Rest when you are tired.  If it is really hot outside: ? Avoid intense activities. ? Use air conditioning or fans, or get in a cooler place. ? Avoid caffeine and alcohol. ? Wear loose-fitting, lightweight, and light-colored clothing.  If it is really cold outside: ? Avoid intense activities. ? Layer your clothing. ? Wear mittens or gloves, a hat, and a scarf when going outside. ? Avoid alcohol.  Learn about heart failure and get support as needed.  Get help to maintain or improve your quality of life and your ability to care for yourself as needed. Contact a doctor if:  You gain weight quickly.  You are more short of breath than usual.  You cannot do your normal activities.  You tire easily.  You cough more than normal, especially with activity.  You have any or more puffiness (swelling) in areas such as your hands, feet, ankles,  or belly (abdomen).  You cannot sleep because it is hard to breathe.  You feel like your heart is beating fast (palpitations).  You get dizzy or light-headed when you stand up. Get help right away if:  You have trouble breathing.  There is a change in mental status, such as becoming less alert or not being able to focus.  You have chest pain or discomfort.  You faint. This information is not intended to replace advice given to you by your health care provider. Make sure you discuss any questions you have with your health care provider. Document Released:  09/22/2008 Document Revised: 05/21/2016 Document Reviewed: 01/30/2013 Elsevier Interactive Patient Education  2017 [employer].

## 2017-08-17 NOTE — Assessment & Plan Note (Signed)
Chronic tobacco use. Given an inhaler in the ED on 7/16 which significantly helps his breathing. Given a Proventil inhaler and ordered PFTs.

## 2017-08-17 NOTE — Telephone Encounter (Signed)
WALK-IN Pt presents with shortness of breath since last evening, states he may have gained appr 1 lb over night, he is able to carry on conversation but is having some difficulty at times, states he is no worse than last eval in ED, would rather be seen in clinic by "his" doctors and also discuss lasix refills for 90 days at a time if possible, he is added at 1415 to [facility] and is instructed to call if he feels worse and will transfer to ED via wh/ch. There are 2 people with him and they are agreeable

## 2017-08-17 NOTE — Telephone Encounter (Signed)
furosemide (LASIX) 20 MG tablet, refill request. Would like med by today.

## 2017-08-19 NOTE — Progress Notes (Signed)
Internal Medicine Clinic Attending  I saw and evaluated the patient.  I personally confirmed the key portions of the history and exam documented by Dr. Helberg and I reviewed pertinent patient test results.  The assessment, diagnosis, and plan were formulated together and I agree with the documentation in the resident's note. 

## 2017-08-24 ENCOUNTER — Telehealth: Payer: Self-pay | Admitting: Pharmacist

## 2017-08-24 ENCOUNTER — Inpatient Hospital Stay (INDEPENDENT_AMBULATORY_CARE_PROVIDER_SITE_OTHER): Payer: Self-pay | Admitting: Physician Assistant

## 2017-08-26 NOTE — Progress Notes (Signed)
Called patient to offer help with meds, unable to reach, left message to call back.

## 2017-08-27 ENCOUNTER — Telehealth: Payer: Self-pay | Admitting: Internal Medicine

## 2017-08-27 NOTE — Telephone Encounter (Signed)
CALLED PATIENT, LMTCB- HE NEEDS TO DO APPLICATION FOR [facility] CARD AND CAFA LETTER, HE IS UNINSURED.

## 2017-09-03 ENCOUNTER — Encounter (HOSPITAL_COMMUNITY): Payer: Self-pay

## 2017-09-08 ENCOUNTER — Other Ambulatory Visit: Payer: Self-pay | Admitting: Pharmacist

## 2017-09-08 DIAGNOSIS — I5042 Chronic combined systolic (congestive) and diastolic (congestive) heart failure: Secondary | ICD-10-CM

## 2017-09-08 DIAGNOSIS — Z9861 Coronary angioplasty status: Principal | ICD-10-CM

## 2017-09-08 DIAGNOSIS — I2510 Atherosclerotic heart disease of native coronary artery without angina pectoris: Secondary | ICD-10-CM

## 2017-09-08 DIAGNOSIS — Z720 Tobacco use: Secondary | ICD-10-CM

## 2017-09-09 ENCOUNTER — Encounter: Payer: Self-pay | Admitting: Pharmacist

## 2017-09-09 MED ORDER — ASPIRIN 81 MG PO TBEC: 81.0000 mg | DELAYED_RELEASE_TABLET | Freq: Every day | ORAL | 3 refills | Status: DC

## 2017-09-09 MED ORDER — LISINOPRIL 10 MG PO TABS: 10.0000 mg | ORAL_TABLET | Freq: Every day | ORAL | 3 refills | Status: DC

## 2017-09-09 MED ORDER — NITROGLYCERIN 0.4 MG SL SUBL: 0.4000 mg | SUBLINGUAL_TABLET | SUBLINGUAL | 3 refills | Status: DC | PRN

## 2017-09-09 MED ORDER — ALBUTEROL SULFATE HFA 108 (90 BASE) MCG/ACT IN AERS: 2.0000 | INHALATION_SPRAY | RESPIRATORY_TRACT | 3 refills | Status: DC | PRN

## 2017-09-09 MED ORDER — ATORVASTATIN CALCIUM 80 MG PO TABS: 80.0000 mg | ORAL_TABLET | Freq: Every day | ORAL | 3 refills | Status: DC

## 2017-09-09 MED ORDER — CARVEDILOL 3.125 MG PO TABS: 3.1250 mg | ORAL_TABLET | Freq: Two times a day (BID) | ORAL | 0 refills | Status: DC

## 2017-09-09 MED ORDER — FUROSEMIDE 20 MG PO TABS: 80.0000 mg | ORAL_TABLET | Freq: Every day | ORAL | 2 refills | Status: DC

## 2017-09-10 MED FILL — FUROSEMIDE 20 MG TABLET: 20 | 30 days supply | Qty: 120 | Fill #1

## 2017-09-28 ENCOUNTER — Encounter: Payer: Self-pay | Admitting: Cardiovascular Disease

## 2017-09-28 ENCOUNTER — Ambulatory Visit (INDEPENDENT_AMBULATORY_CARE_PROVIDER_SITE_OTHER): Payer: Self-pay | Admitting: Cardiovascular Disease

## 2017-09-28 VITALS — BP 102/72 | HR 85 | Ht 70.5 in | Wt 164.8 lb

## 2017-09-28 DIAGNOSIS — I2510 Atherosclerotic heart disease of native coronary artery without angina pectoris: Secondary | ICD-10-CM

## 2017-09-28 DIAGNOSIS — Z9861 Coronary angioplasty status: Secondary | ICD-10-CM

## 2017-09-28 DIAGNOSIS — I5042 Chronic combined systolic (congestive) and diastolic (congestive) heart failure: Secondary | ICD-10-CM

## 2017-09-28 DIAGNOSIS — I255 Ischemic cardiomyopathy: Secondary | ICD-10-CM

## 2017-09-28 DIAGNOSIS — F1410 Cocaine abuse, uncomplicated: Secondary | ICD-10-CM

## 2017-09-28 DIAGNOSIS — E7800 Pure hypercholesterolemia, unspecified: Secondary | ICD-10-CM

## 2017-09-28 DIAGNOSIS — Z79899 Other long term (current) drug therapy: Secondary | ICD-10-CM

## 2017-09-28 DIAGNOSIS — I10 Essential (primary) hypertension: Secondary | ICD-10-CM

## 2017-09-28 MED ORDER — FUROSEMIDE 20 MG PO TABS: 20.0000 mg | ORAL_TABLET | ORAL | 2 refills | Status: DC | PRN

## 2017-09-28 MED ORDER — SPIRONOLACTONE 25 MG PO TABS: 25.0000 mg | ORAL_TABLET | Freq: Every day | ORAL | 3 refills | Status: DC

## 2017-09-28 MED ORDER — FUROSEMIDE 20 MG PO TABS: 40.0000 mg | ORAL_TABLET | Freq: Every day | ORAL | 2 refills | Status: DC

## 2017-09-28 MED ORDER — SPIRONOLACTONE 25 MG PO TABS: 12.5000 mg | ORAL_TABLET | Freq: Every day | ORAL | 3 refills | Status: DC

## 2017-09-28 NOTE — Progress Notes (Signed)
Patient ID: Tyler Wong, male   DOB: 22-Dec-1966, 51 y.o.   MRN: [id number]     HPI: Tyler Wong is a 51 y.o. male who presents to the office today for a 45 month cardiology evaluation following his ST segment elevation anterior wall myocardial infarction.  Tyler Wong is an [place] American male, who denied any significant prior cardiac history.  He presented to: Hospital in the middle of the night of 07/17/2014 with an ST segment elevation anterior wall myocardial infarction.  The patient had admitted to cocaine use the day before.  I performed emergent cardiac catheterization and he was found to have total proximal to mid LAD occlusion.  In a normal ramus intermediate, a dominant left circumflex, and a nondominant Right Coronary Artery.  There is mild to moderate acute left ventricular dysfunction involving the mid distal anterolateral wall and apex as well as distal inferoapical segment with initial acute ejection fraction of 45%.  He underwent successful DES stenting of his LAD and a Resolute integrity 3.5x34 mm stent was inserted, postdilated to 3.9 mm, tapering to 3.7, 8 mm.  Subsequently, he was started on low-dose ACE inhibitor in addition to beta blocker therapy and statin therapy.  He saw Ellen Henri for initial evaluation, and was doing well.  Since his initial MI, I only seen him once in the office in September 2015.  Socially, the patient has had numerous readmissions for recurrent anginal symptoms and was admitted in January 2 hospitalizations with acute on chronic combined CHF.  He also again tested positive for cocaine.  An echo Doppler study showed an EF of 30-35% on 12/31/2006.  During that hospitalization he was seen by Dr. Tana Conch.  He was readmitted in late January with CHF exacerbation, and again has had several evaluations in June, July, and several in August.  He still admits to occasional cocaine use.  He has been taking furosemide up to 40 mg if he feels he is getting short  of breath but has not been taking this daily.  He has been on atorvastatin 80 mg, carvedilol 3.125 million twice a day, lisinopril 10 mg.  He presents to the office today with his sister.  He denies recent chest tightness.  He is unaware of palpitations.  He denies presyncope or syncope.  Past Medical History:  Diagnosis Date  . CAD in native artery 07/17/14   STEMI- LAD stenosis with DES  . Cocaine abuse ([place])   . Dyspnea   . Hypertension   . ST elevation myocardial infarction (STEMI) involving left anterior descending (LAD) coronary artery with complication ([place])   . Tobacco abuse 07/17/2014    Past Surgical History:  Procedure Laterality Date  . CORONARY ANGIOPLASTY WITH STENT PLACEMENT  07/18/14   resolute DES to LAD STEMI  . LEFT HEART CATH N/A 07/17/2014   Procedure: LEFT HEART CATH;  Surgeon: Tyler Sine, MD;  Location: [facility] CATH LAB;  Service: Cardiovascular;  Laterality: N/A;  . PERCUTANEOUS CORONARY STENT INTERVENTION (PCI-S)  07/17/2014   Procedure: PERCUTANEOUS CORONARY STENT INTERVENTION (PCI-S);  Surgeon: Tyler Sine, MD;  Location: [facility] CATH LAB;  Service: Cardiovascular;;    No Known Allergies  Current Outpatient Prescriptions  Medication Sig Dispense Refill  . albuterol (PROVENTIL HFA;VENTOLIN HFA) 108 (90 Base) MCG/ACT inhaler Inhale 2 puffs into the lungs every 4 (four) hours as needed for wheezing or shortness of breath. 1 Inhaler 3  . aspirin 81 MG EC tablet Take 1 tablet (81 mg total) by mouth  daily. 90 tablet 3  . atorvastatin (LIPITOR) 80 MG tablet Take 1 tablet (80 mg total) by mouth daily. 90 tablet 3  . carvedilol (COREG) 3.125 MG tablet Take 1 tablet (3.125 mg total) by mouth 2 (two) times daily with a meal. 180 tablet 0  . furosemide (LASIX) 20 MG tablet Take 1 tablet (20 mg total) by mouth as needed. 60 tablet 2  . HYDROCODONE-ACETAMINOPHEN PO Take 1 tablet by mouth once.    Marland Kitchen lisinopril (PRINIVIL,ZESTRIL) 10 MG tablet Take 1 tablet (10 mg total) by mouth  daily. 90 tablet 3  . Multiple Vitamin (MULTIVITAMIN) tablet Take 1 tablet by mouth daily.    . nitroGLYCERIN (NITROSTAT) 0.4 MG SL tablet Place 1 tablet (0.4 mg total) under the tongue every 5 (five) minutes as needed for chest pain. 25 tablet 3  . spironolactone (ALDACTONE) 25 MG tablet Take 1 tablet (25 mg total) by mouth daily. 90 tablet 3   No current facility-administered medications for this visit.     Social History   Social History  . Marital status: Single    Spouse name: N/A  . Number of children: N/A  . Years of education: N/A   Occupational History  . Landscaping    Social History Main Topics  . Smoking status: Current Every Day Smoker    Packs/day: 0.50    Years: 30.00    Types: Cigarettes  . Smokeless tobacco: Never Used     Comment: .5 PK PER DAY  . Alcohol use No     Comment: Patient denies abuse, states social drinker  . Drug use: Yes    Types: Cocaine     Comment: heroin  . Sexual activity: Not on file   Other Topics Concern  . Not on file   Social History Narrative   Lives in [place] with his sister and girlfriend. He works Aeronautical engineer work.     Family History  Problem Relation Age of Onset  . Cirrhosis Father   . Heart attack Mother   . Heart attack Sister   . Heart failure Sister   . Heart attack Brother     ROS General: Negative; No fevers, chills, or night sweats HEENT: Negative; No changes in vision or hearing, sinus congestion, difficulty swallowing Pulmonary: Negative; No cough, wheezing, shortness of breath, hemoptysis Cardiovascular: See history of present illness GI: Negative; No nausea, vomiting, diarrhea, or abdominal pain GU: Negative; No dysuria, hematuria, or difficulty voiding Musculoskeletal: Negative; no myalgias, joint pain, or weakness Hematologic: Negative; no easy bruising, bleeding Endocrine: Negative; no heat/cold intolerance; no diabetes, Neuro: Negative; no changes in balance, headaches Skin:  Negative; No rashes or skin lesions Psychiatric: Positive for frequent dependence with cocaine Sleep: Negative; No snoring,  daytime sleepiness, hypersomnolence, bruxism, restless legs, hypnogognic hallucinations. Other comprehensive 14 point system review is negative    Physical Exam BP 102/72   Pulse 85   Ht 5' 10.5" (1.791 m)   Wt 164 lb 12.8 oz (74.8 kg)   BMI 23.31 kg/m    Repeat blood pressure by me was 98/64 supine and 102/64 standing.  Wt Readings from Last 3 Encounters:  09/28/17 164 lb 12.8 oz (74.8 kg)  08/17/17 165 lb 8 oz (75.1 kg)  07/12/17 168 lb (76.2 kg)   General: Alert, oriented, no distress.  Skin: normal turgor, no rashes, warm and dry HEENT: Normocephalic, atraumatic. Pupils equal round and reactive to light; sclera anicteric; extraocular muscles intact;  Nose without nasal septal hypertrophy Mouth/Parynx  benign; Mallinpatti scale 2 Neck: No JVD, no carotid bruits; normal carotid upstroke Lungs: clear to ausculatation and percussion; no wheezing or rales Chest wall: without tenderness to palpitation Heart: PMI not displaced, RRR, s1 s2 normal, 1/6 systolic murmur, no diastolic murmur, no rubs, gallops, thrills, or heaves Abdomen: soft, nontender; no hepatosplenomehaly, BS+; abdominal aorta nontender and not dilated by palpation. Back: no CVA tenderness Pulses 2+ Musculoskeletal: full range of motion, normal strength, no joint deformities Extremities: no clubbing cyanosis or edema, Homan's sign negative  Neurologic: grossly nonfocal; Cranial nerves grossly wnl Psychologic: Normal mood and affect   ECG (independently read by me): Normal sinus rhythm.  Q waves V1 through V4 with T wave inversion across the precordium, concordant with his recent anterior wall myocardial infarction.  LABS: BMP Latest Ref Rng & Units 08/03/2017 07/12/2017 04/26/2017  Glucose 65 - 99 mg/dL 153(H) 121(H) 133(H)  BUN 6 - 20 mg/dL 25(H) 27(H) 25(H)  Creatinine 0.61 - 1.24 mg/dL  1.51(H) 1.52(H) 1.56(H)  BUN/Creat Ratio 9 - 20 - - 16  Sodium 135 - 145 mmol/L 139 138 140  Potassium 3.5 - 5.1 mmol/L 3.5 3.9 5.1  Chloride 101 - 111 mmol/L 106 108 103  CO2 22 - 32 mmol/L _0 Calcium 8.9 - 10.3 mg/dL 8.5(L) 8.5(L) 9.0   Hepatic Function Latest Ref Rng & Units 07/12/2017 04/12/2017 01/25/2017  Total Protein 6.5 - 8.1 g/dL 5.8(L) 6.0(L) 6.3(L)  Albumin 3.5 - 5.0 g/dL 2.8(L) 2.9(L) 3.2(L)  AST 15 - 41 U/L 32 48(H) 31  ALT 17 - 63 U/L 41 103(H) 56  Alk Phosphatase 38 - 126 U/L 92 113 77  Total Bilirubin 0.3 - 1.2 mg/dL 0.8 0.9 1.5(H)  Bilirubin, Direct 0.1 - 0.5 mg/dL 0.2 - -   CBC Latest Ref Rng & Units 08/03/2017 07/12/2017 04/13/2017  WBC 4.0 - 10.5 K/uL 8.6 6.4 8.1  Hemoglobin 13.0 - 17.0 g/dL 13.5 13.1 14.8  Hematocrit 39.0 - 52.0 % 40.4 39.1 44.0  Platelets 150 - 400 K/uL 257 190 266   Lab Results  Component Value Date   MCV 89.2 08/03/2017   MCV 89.1 07/12/2017   MCV 87.3 04/13/2017   Lab Results  Component Value Date   TSH 0.382 07/18/2014   Lipid Panel     Component Value Date/Time   CHOL 191 12/31/2016 0323   TRIG 125 12/31/2016 0323   HDL 54 12/31/2016 0323   CHOLHDL 3.5 12/31/2016 0323   VLDL 25 12/31/2016 0323   LDLCALC 112 (H) 12/31/2016 0323   RADIOLOGY: No results found.  IMPRESSION:  1. Chronic combined systolic and diastolic congestive heart failure ([place])   2. Ischemic cardiomyopathy   3. Hypertension, unspecified type   4. Pure hypercholesterolemia   5. CAD S/P percutaneous coronary angioplasty   6. Medication management   7. Cocaine abuse [facility])     ASSESSMENT AND PLAN: Tyler Wong is a 51 year old gentleman, who Suffered an anterior wall myocardial infarction most likely contributed by cocaine use in July 2015.  He underwent successful DES stenting to a totally occluded LAD.  an anterior wall ST segment elevation myocardial infarction undoubtedly contributed by cocaine use.  He underwent successful stenting of a totally  occluded LAD.  Unfortunately, the patient has had persistent cocaine use off and on a weekly basis.  He has had numerous readmissions to the hospital and has had issues with CHF exacerbation.  He is now found to have an EF of 30-35% on his most  recent echo Doppler study in January 2018.  I had a long discussion with both he and his sister.  I discussed the vasospasm often associated with cocaine and potential for subsequent MI and death.  His blood pressure is low on exam.  He has not been having significant chest pain.  At times he has been taking up to 80 mg of Lasix as needed for shortness of breath, but has not been taking Lasix on a daily basis.  As result, I have recommended he not do this.  I will initiate aldosterone blockade without with spironolactone initially 12.5 mg for the first week and then he will increase this to twice a day.  If he does have periods of increased shortness of breath.  He can take 20-40 mg of Lasix as needed.  I am recommending a follow-up echo Doppler study to reassess his LV systolic and diastolic function 10 months after his last evaluation.  I am recommending a complete set of fasting laboratory be obtained.  He is on atorvastatin 80 mg for hyperlipidemia with target LDL less than 70.  He's not having recurrent anginal type symptoms.  I will see him in 4 weeks for reevaluation and if he is doing well .  Additional guidelines recommend medical therapy will be implemented.  Time spent: 40 minutes  Tyler Sine, MD, [facility]  09/30/2017 8:02 PM

## 2017-09-28 NOTE — Patient Instructions (Addendum)
Medication Instructions:  DECREASE furosemide to 20 mg AS NEEDED.  START spironolactone 12.5mg  (1/2 tablet) daily --after 3-5 days, Blood pressure is okay you may increase to 12.5 mg (1/2 tablet) two times daily.  Labwork: Please return for FASTING labs (CMET, CBC, TSH, Lipid, HmgA1C)  Our in office lab hours are Monday-Friday 8:00-4:30, closed for lunch 1-2 pm.  No appointment needed.   Testing/Procedures: Your physician has requested that you have an echocardiogram. Echocardiography is a painless test that uses sound waves to create images of your heart. It provides your doctor with information about the size and shape of your heart and how well your heart's chambers and valves are working. This procedure takes approximately one hour. There are no restrictions for this procedure.  This will be done at our [facility] location:  [place]: Your physician recommends that you schedule a follow-up appointment in: 4 weeks with Dr. Claiborne Billings.   Any Other Special Instructions Will Be Listed Below (If Applicable).     If you need a refill on your cardiac medications before your next appointment, please call your pharmacy.

## 2017-09-30 MED FILL — SPIRONOLACTONE 25 MG TABLET: 25 | 30 days supply | Qty: 30 | Fill #0

## 2017-10-06 ENCOUNTER — Other Ambulatory Visit (HOSPITAL_COMMUNITY): Payer: Self-pay

## 2017-10-19 ENCOUNTER — Other Ambulatory Visit (HOSPITAL_COMMUNITY): Payer: Self-pay

## 2017-10-23 ENCOUNTER — Emergency Department (HOSPITAL_COMMUNITY): Payer: Self-pay

## 2017-10-23 ENCOUNTER — Emergency Department (HOSPITAL_COMMUNITY)
Admission: EM | Admit: 2017-10-23 | Discharge: 2017-10-23 | Disposition: A | Discharge status: Home / Self Care | Payer: Self-pay | Attending: Emergency Medicine | Admitting: Emergency Medicine

## 2017-10-23 ENCOUNTER — Encounter (HOSPITAL_COMMUNITY): Payer: Self-pay | Admitting: Emergency Medicine

## 2017-10-23 ENCOUNTER — Other Ambulatory Visit: Payer: Self-pay

## 2017-10-23 DIAGNOSIS — Z7982 Long term (current) use of aspirin: Secondary | ICD-10-CM | POA: Insufficient documentation

## 2017-10-23 DIAGNOSIS — R1084 Generalized abdominal pain: Secondary | ICD-10-CM | POA: Insufficient documentation

## 2017-10-23 DIAGNOSIS — Z79899 Other long term (current) drug therapy: Secondary | ICD-10-CM | POA: Insufficient documentation

## 2017-10-23 DIAGNOSIS — Z9861 Coronary angioplasty status: Secondary | ICD-10-CM

## 2017-10-23 DIAGNOSIS — I5042 Chronic combined systolic (congestive) and diastolic (congestive) heart failure: Secondary | ICD-10-CM

## 2017-10-23 DIAGNOSIS — I110 Hypertensive heart disease with heart failure: Secondary | ICD-10-CM | POA: Insufficient documentation

## 2017-10-23 DIAGNOSIS — R0789 Other chest pain: Secondary | ICD-10-CM

## 2017-10-23 DIAGNOSIS — I2510 Atherosclerotic heart disease of native coronary artery without angina pectoris: Secondary | ICD-10-CM

## 2017-10-23 DIAGNOSIS — I5043 Acute on chronic combined systolic (congestive) and diastolic (congestive) heart failure: Secondary | ICD-10-CM | POA: Insufficient documentation

## 2017-10-23 DIAGNOSIS — R0602 Shortness of breath: Secondary | ICD-10-CM

## 2017-10-23 DIAGNOSIS — F17210 Nicotine dependence, cigarettes, uncomplicated: Secondary | ICD-10-CM | POA: Insufficient documentation

## 2017-10-23 DIAGNOSIS — I252 Old myocardial infarction: Secondary | ICD-10-CM | POA: Insufficient documentation

## 2017-10-23 DIAGNOSIS — R140 Abdominal distension (gaseous): Secondary | ICD-10-CM | POA: Insufficient documentation

## 2017-10-23 LAB — CBC
HCT: 41.3 % (ref 39.0–52.0)
Hemoglobin: 14.1 g/dL (ref 13.0–17.0)
MCH: 30.3 pg (ref 26.0–34.0)
MCHC: 34.1 g/dL (ref 30.0–36.0)
MCV: 88.6 fL (ref 78.0–100.0)
Platelets: 193 10*3/uL (ref 150–400)
RBC: 4.66 MIL/uL (ref 4.22–5.81)
RDW: 14.7 % (ref 11.5–15.5)
WBC: 6.6 10*3/uL (ref 4.0–10.5)

## 2017-10-23 LAB — BASIC METABOLIC PANEL
Anion gap: 10 (ref 5–15)
BUN: 24 mg/dL — ABNORMAL HIGH (ref 6–20)
CO2: 23 mmol/L (ref 22–32)
Calcium: 9.1 mg/dL (ref 8.9–10.3)
Chloride: 104 mmol/L (ref 101–111)
Creatinine, Ser: 1.44 mg/dL — ABNORMAL HIGH (ref 0.61–1.24)
GFR calc Af Amer: >60 mL/min (ref >60)
GFR calc non Af Amer: 55 mL/min — ABNORMAL LOW (ref >60)
Glucose, Bld: 131 mg/dL — ABNORMAL HIGH (ref 65–99)
Potassium: 3.6 mmol/L (ref 3.5–5.1)
Sodium: 137 mmol/L (ref 135–145)

## 2017-10-23 LAB — HEPATIC FUNCTION PANEL
ALT: 27 U/L (ref 17–63)
AST: 25 U/L (ref 15–41)
Albumin: 3.3 g/dL — ABNORMAL LOW (ref 3.5–5.0)
Alkaline Phosphatase: 90 U/L (ref 38–126)
Bilirubin, Direct: 0.1 mg/dL (ref 0.1–0.5)
Indirect Bilirubin: 0.6 mg/dL (ref 0.3–0.9)
Total Bilirubin: 0.7 mg/dL (ref 0.3–1.2)
Total Protein: 6.3 g/dL — ABNORMAL LOW (ref 6.5–8.1)

## 2017-10-23 LAB — BRAIN NATRIURETIC PEPTIDE: B Natriuretic Peptide: 972.2 pg/mL — ABNORMAL HIGH (ref 0.0–100.0)

## 2017-10-23 LAB — I-STAT TROPONIN, ED
Troponin i, poc: 0.02 ng/mL (ref 0.00–0.08)
Troponin i, poc: 0.01 ng/mL (ref 0.00–0.08)

## 2017-10-23 LAB — LIPASE, BLOOD: Lipase: 36 U/L (ref 11–51)

## 2017-10-23 MED ORDER — SPIRONOLACTONE 25 MG PO TABS: 25.0000 mg | ORAL_TABLET | Freq: Every day | ORAL | 0 refills | Status: DC

## 2017-10-23 MED ORDER — FUROSEMIDE 10 MG/ML IJ SOLN
40.0000 mg | Freq: Once | INTRAMUSCULAR | Status: AC
  Administered 2017-10-23: 40 mg via INTRAVENOUS
  Filled 2017-10-23: qty 4

## 2017-10-23 MED ORDER — FUROSEMIDE 40 MG PO TABS: 40.0000 mg | ORAL_TABLET | ORAL | 0 refills | Status: DC | PRN

## 2017-10-23 NOTE — ED Triage Notes (Signed)
PT reports  He takes Lasix  As needed  By MD instructions. Pt does not weigh daily as directed  By MD. Pt takes the lasix when his ABD is distended.  Pt last dose of  Lasix was last night.

## 2017-10-23 NOTE — ED Triage Notes (Signed)
Pt presents with CP and SOB that began yesterday and gradually worsened; pt suggests "fluid on the lungs"; lungs sounds clear bilaterally; pt appears anxious and pupils pinpoint

## 2017-10-23 NOTE — ED Notes (Signed)
Pt's oxygen saturation fluctuated between 92% and 97% on room air while ambulating

## 2017-10-23 NOTE — ED Notes (Addendum)
Pt had steady gait while ambulating in hall. HR stayed stable.

## 2017-10-23 NOTE — Discharge Instructions (Signed)
Your lab work and imaging has been reassuring.  Did speak with the cardiologist who recommends you taking 40 mg of Lasix during tomorrow for the next 4 days.  Continue your spironolactone.  Follow-up with your echocardiogram and the cardiologist on Monday.  Return to the ED if you develop any worsening shortness of breath, worsening chest pain or your symptoms return.

## 2017-10-25 ENCOUNTER — Ambulatory Visit (HOSPITAL_COMMUNITY): Payer: Self-pay | Attending: Cardiology

## 2017-10-25 ENCOUNTER — Other Ambulatory Visit: Payer: Self-pay

## 2017-10-25 DIAGNOSIS — I252 Old myocardial infarction: Secondary | ICD-10-CM | POA: Insufficient documentation

## 2017-10-25 DIAGNOSIS — F1110 Opioid abuse, uncomplicated: Secondary | ICD-10-CM | POA: Insufficient documentation

## 2017-10-25 DIAGNOSIS — F1410 Cocaine abuse, uncomplicated: Secondary | ICD-10-CM | POA: Insufficient documentation

## 2017-10-25 DIAGNOSIS — F17200 Nicotine dependence, unspecified, uncomplicated: Secondary | ICD-10-CM | POA: Insufficient documentation

## 2017-10-25 DIAGNOSIS — R29898 Other symptoms and signs involving the musculoskeletal system: Secondary | ICD-10-CM | POA: Insufficient documentation

## 2017-10-25 DIAGNOSIS — I110 Hypertensive heart disease with heart failure: Secondary | ICD-10-CM | POA: Insufficient documentation

## 2017-10-25 DIAGNOSIS — I10 Essential (primary) hypertension: Secondary | ICD-10-CM

## 2017-10-25 DIAGNOSIS — I255 Ischemic cardiomyopathy: Secondary | ICD-10-CM | POA: Insufficient documentation

## 2017-10-25 DIAGNOSIS — Z8249 Family history of ischemic heart disease and other diseases of the circulatory system: Secondary | ICD-10-CM | POA: Insufficient documentation

## 2017-10-25 DIAGNOSIS — Z9861 Coronary angioplasty status: Secondary | ICD-10-CM | POA: Insufficient documentation

## 2017-10-25 DIAGNOSIS — I5042 Chronic combined systolic (congestive) and diastolic (congestive) heart failure: Secondary | ICD-10-CM | POA: Insufficient documentation

## 2017-10-25 DIAGNOSIS — I2510 Atherosclerotic heart disease of native coronary artery without angina pectoris: Secondary | ICD-10-CM | POA: Insufficient documentation

## 2017-10-25 DIAGNOSIS — I081 Rheumatic disorders of both mitral and tricuspid valves: Secondary | ICD-10-CM | POA: Insufficient documentation

## 2017-10-27 NOTE — ED Provider Notes (Signed)
[facility] EMERGENCY DEPARTMENT Provider Note   CSN: [id number] Arrival date & time: 10/23/17  0510     History   Chief Complaint Chief Complaint  Patient presents with  . Chest Pain  . Shortness of Breath    HPI Tyler Wong is a 51 y.o. male.  HPI 51 year old African-American male past medical history significant for cocaine abuse, CAD status post PCI, hypertension, CHF last EF was 30-35% in 2018, hypertension, tobacco abuse that presents to the emergency department today with complaints of chest pain or shortness of breath.  The patient states that his symptoms started yesterday have gradually worsened.  Reports substernal chest pain does not radiate.  States the pain is intermittent.  Describes the pain as a tightness.  Denies any associated diaphoresis, nausea, emesis.  Reports associated shortness of breath.  States that "there is fluid on my lungs or in my abdomen".  Patient states that he was started on spironolactone approximately 1 month ago when he saw his cardiologist for same symptoms.  They started about 12.5 mg and after 2 weeks he increased to 25 mg.  He is supposed to take hsis Lasix as needed for continued shortness of breath.  Patient was seen by his cardiologist one month ago for same symptoms.  I did review their note.  The patient does report generalized abdominal pain states that it is due to swelling in his belly from fluid.  Denies any associated symptoms of fever, chills, nausea, emesis, urinary symptoms, change in bowel habits.  He has not tried anything for his symptoms prior to arrival.  Patient has been admitted to the hospital several times this year for recurrent anginal symptoms and acute on chronic combined CHF.  He tested positive for cocaine at those hospitalizations.  Patient still admits to occasional cocaine use.  States that he is out of his Lasix and has not taken it since yesterday morning.  Pt denies any fever, chill, ha, vision  changes, lightheadedness, dizziness, congestion, neck pain, cough,  n/v/d, urinary symptoms, change in bowel habits, melena, hematochezia, lower extremity paresthesias.   Past Medical History:  Diagnosis Date  . CAD in native artery 07/17/14   STEMI- LAD stenosis with DES  . Cocaine abuse ([place])   . Dyspnea   . Hypertension   . ST elevation myocardial infarction (STEMI) involving left anterior descending (LAD) coronary artery with complication ([place])   . Tobacco abuse 07/17/2014    Patient Active Problem List   Diagnosis Date Noted  . Hepatitis C 04/13/2017  . Current non-adherence to medical treatment 04/13/2017  . Acute on chronic combined systolic (congestive) and diastolic (congestive) heart failure ([place]) 04/13/2017  . HTN (hypertension) 04/12/2017  . CAP (community acquired pneumonia) 01/24/2017  . Acute exacerbation of congestive heart failure ([place]) 01/24/2017  . CHF exacerbation ([place]) 01/24/2017  . Influenza A 01/24/2017  . Insomnia 01/18/2017  . Chronic combined systolic and diastolic congestive heart failure ([place]) 01/04/2017  . AKI (acute kidney injury) ([place]) 01/04/2017  . Hyperlipidemia 01/04/2017  . Chest pain 12/30/2016  . CAD S/P percutaneous coronary angioplasty 07/18/2014  . Tobacco abuse 07/17/2014  . Cocaine use 07/17/2014    Past Surgical History:  Procedure Laterality Date  . CORONARY ANGIOPLASTY WITH STENT PLACEMENT  07/18/14   resolute DES to LAD STEMI  . LEFT HEART CATH N/A 07/17/2014   Procedure: LEFT HEART CATH;  Surgeon: [name], MD;  Location[facility] C[facility] LAB;  Service: Cardiovascular;  Laterality: N/A;  .  PERCUTANEOUS CORONARY STENT INTERVENTION (PCI-S)  07/17/2014   Procedure: PERCUTANEOUS CORONARY STENT INTERVENTION (PCI-S);  Surgeon: [name], MD;  Location: [facility] CATH LAB;  Service: Cardiovascular;;       Home Medications    Prior to Admission medications   Medication Sig Start Date End Date Taking? Authorizing Provider  albuterol  (PROVENTIL HFA;VENTOLIN HFA) 108 (90 Base) MCG/ACT inhaler Inhale 2 puffs into the lungs every 4 (four) hours as needed for wheezing or shortness of breath. 09/09/17  Yes Rice, Resa Miner, MD  aspirin 81 MG EC tablet Take 1 tablet (81 mg total) by mouth daily. 09/09/17  Yes Rice, Resa Miner, MD  atorvastatin (LIPITOR) 80 MG tablet Take 1 tablet (80 mg total) by mouth daily. 09/09/17  Yes Rice, Resa Miner, MD  carvedilol (COREG) 3.125 MG tablet Take 1 tablet (3.125 mg total) by mouth 2 (two) times daily with a meal. 09/09/17 12/08/17 Yes Rice, Resa Miner, MD  lisinopril (PRINIVIL,ZESTRIL) 10 MG tablet Take 1 tablet (10 mg total) by mouth daily. 09/09/17 12/08/17 Yes Rice, Resa Miner, MD  Multiple Vitamin (MULTIVITAMIN) tablet Take 1 tablet by mouth daily.   Yes [provider]  furosemide (LASIX) 40 MG tablet Take 1 tablet (40 mg total) by mouth as needed. 10/23/17 01/21/18  Doristine Devoid, PA-C  nitroGLYCERIN (NITROSTAT) 0.4 MG SL tablet Place 1 tablet (0.4 mg total) under the tongue every 5 (five) minutes as needed for chest pain. 09/09/17   Collier Salina, MD  spironolactone (ALDACTONE) 25 MG tablet Take 1 tablet (25 mg total) by mouth daily. 10/23/17 01/21/18  Doristine Devoid, PA-C    Family History Family History  Problem Relation Age of Onset  . Cirrhosis Father   . Heart attack Mother   . Heart attack Sister   . Heart failure Sister   . Heart attack Brother     Social History Social History  Substance Use Topics  . Smoking status: Current Every Day Smoker    Packs/day: 0.50    Years: 30.00    Types: Cigarettes  . Smokeless tobacco: Never Used     Comment: .5 PK PER DAY  . Alcohol use No     Comment: Patient denies abuse, states social drinker     Allergies   Patient has no known allergies.   Review of Systems Review of Systems  Constitutional: Negative for chills, diaphoresis and fever.  HENT: Negative for congestion.   Eyes: Negative for  visual disturbance.  Respiratory: Positive for shortness of breath. Negative for cough and wheezing.   Cardiovascular: Positive for chest pain. Negative for palpitations and leg swelling.  Gastrointestinal: Positive for abdominal pain. Negative for diarrhea, nausea and vomiting.  Genitourinary: Negative for dysuria, flank pain, frequency, hematuria and urgency.  Musculoskeletal: Negative for arthralgias and myalgias.  Skin: Negative for rash.  Neurological: Negative for dizziness, syncope, weakness, light-headedness, numbness and headaches.  Psychiatric/Behavioral: Negative for sleep disturbance. The patient is not nervous/anxious.      Physical Exam Updated Vital Signs BP (!) 122/93   Pulse (!) 105   Temp 98.7 F (37.1 C) (Oral)   Resp 18   SpO2 96%   Physical Exam  Constitutional: He is oriented to person, place, and time. He appears well-developed and well-nourished.  Non-toxic appearance. No distress.  HENT:  Head: Normocephalic and atraumatic.  Nose: Nose normal.  Mouth/Throat: Oropharynx is clear and moist.  Eyes: Pupils are equal, round, and reactive to light. Conjunctivae are normal. Right eye  exhibits no discharge. Left eye exhibits no discharge.  Neck: Normal range of motion. Neck supple. No JVD present. No tracheal deviation present.  Cardiovascular: Normal rate, regular rhythm, normal heart sounds and intact distal pulses.  Exam reveals no gallop and no friction rub.   No murmur heard. Pulmonary/Chest: Effort normal and breath sounds normal. No respiratory distress. He has no wheezes. He has no rales. He exhibits no tenderness.  No hypoxia or tachypnea.  Abdominal: Soft. Bowel sounds are normal. He exhibits no distension. There is generalized tenderness. There is no rigidity, no rebound, no guarding, no CVA tenderness, no tenderness at McBurney's point and negative Murphy's sign.  Abdomen does not appear distended.  Generalized abdominal pain with palpation but no focal  tenderness.  No signs of rigidity or rebound.  Musculoskeletal: Normal range of motion.  No lower extremity edema or calf tenderness.  Lymphadenopathy:    He has no cervical adenopathy.  Neurological: He is alert and oriented to person, place, and time.  Skin: Skin is warm and dry. Capillary refill takes less than 2 seconds. He is not diaphoretic.  Psychiatric: His behavior is normal. Judgment and thought content normal.  Nursing note and vitals reviewed.    ED Treatments / Results  Labs (all labs ordered are listed, but only abnormal results are displayed) Labs Reviewed  BASIC METABOLIC PANEL - Abnormal; Notable for the following:       Result Value   Glucose, Bld 131 (*)    BUN 24 (*)    Creatinine, Ser 1.44 (*)    GFR calc non Af Amer 55 (*)    All other components within normal limits  BRAIN NATRIURETIC PEPTIDE - Abnormal; Notable for the following:    B Natriuretic Peptide 972.2 (*)    All other components within normal limits  HEPATIC FUNCTION PANEL - Abnormal; Notable for the following:    Total Protein 6.3 (*)    Albumin 3.3 (*)    All other components within normal limits  CBC  LIPASE, BLOOD  I-STAT TROPONIN, ED  I-STAT TROPONIN, ED    EKG  EKG Interpretation  Date/Time:  Saturday October 23 2017 10:45:11 EDT Ventricular Rate:  96 PR Interval:  170 QRS Duration: 87 QT Interval:  384 QTC Calculation: 486 R Axis:   -82 Text Interpretation:  Sinus rhythm Ventricular premature complex Biatrial enlargement Left anterior fascicular block Left ventricular hypertrophy Anterior infarct, old Confirmed by Ripley Fraise [phone]) on 10/24/2017 8:40:10 AM       Radiology No results found.  Procedures Procedures (including critical care time)  Medications Ordered in ED Medications  furosemide (LASIX) injection 40 mg (40 mg Intravenous Given 10/23/17 1053)     Initial Impression / Assessment and Plan / ED Course  I have reviewed the triage vital signs and  the nursing notes.  Pertinent labs & imaging results that were available during my care of the patient were reviewed by me and considered in my medical decision making (see chart for details).     Patient presents to the ED for evaluation of chest pain, shortness of breath, "fluid in the abdomen and on lungs" and generalized abdominal pain.  Patient has a significant cardiac history with congestive heart failure last EF measuring 30-35%.  Was seen by his cardiologist on 09/28/2017 for same symptoms.  At that time he was started on spironolactone and told to take Lasix as needed for increasing shortness of breath.  Patient has scheduled a cardiac ultrasound in 2  days.  Patient has had several admissions in the past year for similar symptoms.  Patient is a cocaine user and continues to use cocaine despite multiple attempts to stop.  Patient is overall well-appearing and nontoxic.  Vital signs are reassuring.  Patient is afebrile.  Mild tachypnea noted.  On exam patient's lungs are clear to auscultation bilaterally.  No crackles noted.  Generalized abdominal pain to palpation but no focal tenderness.  Regular rate and rhythm.  No lower extremity edema or pitting edema noted.  Patient's workup has been reassuring.  No leukocytosis.  Patient's creatinine is elevated at 1.44 but stable at baseline.  Liver function is normal.  Lipase is normal.  Delta troponins negative.  EKG shows no ischemic changes and appears the patient's baseline.  Chest x-ray again notes cardiomegaly with bilateral pulmonary congestion but no interstitial edema.  No focal area of consolidation concerning for pneumonia.  Patient symptoms likely due to worsening congestive heart failure versus patient's continue cocaine use.  Patient has been seen by cardiologist for the same symptoms approximate 1 month ago.  Has cardiac ultrasound in 2 days.  Patient has been out of his Lasix since yesterday morning.  On exam patient does not appear  fluid overloaded.  There is no pitting edema.  Lungs clear to auscultation bilaterally.  No interstitial edema noted on the chest x-ray.  His BNP is elevated at 950 however patient does have a history of same and actually is down from his prior admission and CHF exacerbation.  Patient does complain of generalized abdominal pain and ascites.  The patient has no focal abdominal tenderness on exam.  CT of abdomen was ordered that showed no acute abnormalities of the abdomen.  It does note cardiomegaly with small pericardial effusion and small bilateral pleural effusions.  Lipase is normal.  Liver enzymes are normal.  Low suspicion for any acute abnormalities.  No significant ascites noted.  Patient symptoms likely due to worsening congestive heart failure however patient does not appear to be fluid overload at this time.  I did discuss with cardiology Dr. Curt Bears.  He agrees to get IV Lasix and have patient follow-up in the outpatient setting.  Did not feel that he needs admitting at this time.  Patient was given IV Lasix 40 mg.  He feels very much improved.  He was able to ambulate with saturation of 95%.  Patient symptoms seem very atypical for ACS, PE, dissection. Exam reassuring given negative delta trop, stable ekg, and no sig signs of fluid overload.   I did go back to reexamine patient to see if he felt stable for discharge he had already pulled out his IV and states he was ready to go he did not want to stay any longer. He was wiping up blood on the floor.  Pt is hemodynamically stable, in NAD, & able to ambulate in the ED. Evaluation does not show pathology that would require ongoing emergent intervention or inpatient treatment. I explained the diagnosis to the patient. Pain has been managed & has no complaints prior to dc. Pt is comfortable with above plan and is stable for discharge at this time. All questions were answered prior to disposition. Strict return precautions for f/u to the ED were  discussed. Encouraged follow up with PCP.  Pt dicussed with Dr. Billy Fischer who is agreeable to the above plan.     Final Clinical Impressions(s) / ED Diagnoses   Final diagnoses:  Shortness of breath  Atypical chest pain  New Prescriptions Discharge Medication List as of 10/23/2017  1:50 PM       Doristine Devoid, PA-C 10/27/17 1606    Gareth Morgan, MD 10/28/17 1302

## 2017-10-29 ENCOUNTER — Ambulatory Visit: Payer: Self-pay | Admitting: Cardiovascular Disease

## 2017-11-08 MED FILL — FUROSEMIDE 20 MG TABLET: 20 | 30 days supply | Qty: 120 | Fill #2

## 2017-11-09 ENCOUNTER — Telehealth: Payer: Self-pay | Admitting: Cardiovascular Disease

## 2017-11-09 NOTE — Telephone Encounter (Signed)
LVM for patient to call back to schedule f/u appt with dr. Claiborne Billings or app.

## 2017-11-16 NOTE — Telephone Encounter (Signed)
Closed Encounter  °

## 2017-12-13 ENCOUNTER — Emergency Department (HOSPITAL_COMMUNITY)
Admission: EM | Admit: 2017-12-13 | Discharge: 2017-12-13 | Disposition: A | Discharge status: Home / Self Care | Payer: Self-pay | Attending: Emergency Medicine | Admitting: Emergency Medicine

## 2017-12-13 ENCOUNTER — Encounter (HOSPITAL_COMMUNITY): Payer: Self-pay

## 2017-12-13 ENCOUNTER — Emergency Department (HOSPITAL_COMMUNITY): Payer: Self-pay

## 2017-12-13 ENCOUNTER — Other Ambulatory Visit: Payer: Self-pay

## 2017-12-13 DIAGNOSIS — F17210 Nicotine dependence, cigarettes, uncomplicated: Secondary | ICD-10-CM | POA: Insufficient documentation

## 2017-12-13 DIAGNOSIS — R0602 Shortness of breath: Secondary | ICD-10-CM | POA: Insufficient documentation

## 2017-12-13 DIAGNOSIS — Z79899 Other long term (current) drug therapy: Secondary | ICD-10-CM | POA: Insufficient documentation

## 2017-12-13 DIAGNOSIS — Z955 Presence of coronary angioplasty implant and graft: Secondary | ICD-10-CM | POA: Insufficient documentation

## 2017-12-13 DIAGNOSIS — I5042 Chronic combined systolic (congestive) and diastolic (congestive) heart failure: Secondary | ICD-10-CM | POA: Insufficient documentation

## 2017-12-13 DIAGNOSIS — Z7982 Long term (current) use of aspirin: Secondary | ICD-10-CM | POA: Insufficient documentation

## 2017-12-13 DIAGNOSIS — R079 Chest pain, unspecified: Secondary | ICD-10-CM | POA: Insufficient documentation

## 2017-12-13 DIAGNOSIS — Z9861 Coronary angioplasty status: Secondary | ICD-10-CM

## 2017-12-13 DIAGNOSIS — I110 Hypertensive heart disease with heart failure: Secondary | ICD-10-CM | POA: Insufficient documentation

## 2017-12-13 DIAGNOSIS — I2510 Atherosclerotic heart disease of native coronary artery without angina pectoris: Secondary | ICD-10-CM | POA: Insufficient documentation

## 2017-12-13 LAB — CBC
HCT: 38.9 % — ABNORMAL LOW (ref 39.0–52.0)
Hemoglobin: 13.2 g/dL (ref 13.0–17.0)
MCH: 30.6 pg (ref 26.0–34.0)
MCHC: 33.9 g/dL (ref 30.0–36.0)
MCV: 90.3 fL (ref 78.0–100.0)
Platelets: 203 10*3/uL (ref 150–400)
RBC: 4.31 MIL/uL (ref 4.22–5.81)
RDW: 14.5 % (ref 11.5–15.5)
WBC: 6.9 10*3/uL (ref 4.0–10.5)

## 2017-12-13 LAB — BASIC METABOLIC PANEL
Anion gap: 8 (ref 5–15)
BUN: 15 mg/dL (ref 6–20)
CO2: 25 mmol/L (ref 22–32)
Calcium: 8.6 mg/dL — ABNORMAL LOW (ref 8.9–10.3)
Chloride: 105 mmol/L (ref 101–111)
Creatinine, Ser: 1.26 mg/dL — ABNORMAL HIGH (ref 0.61–1.24)
GFR calc Af Amer: >60 mL/min (ref >60)
GFR calc non Af Amer: >60 mL/min (ref >60)
Glucose, Bld: 154 mg/dL — ABNORMAL HIGH (ref 65–99)
Potassium: 3.6 mmol/L (ref 3.5–5.1)
Sodium: 138 mmol/L (ref 135–145)

## 2017-12-13 LAB — BRAIN NATRIURETIC PEPTIDE: B Natriuretic Peptide: 1462.3 pg/mL — ABNORMAL HIGH (ref 0.0–100.0)

## 2017-12-13 LAB — I-STAT TROPONIN, ED: Troponin i, poc: 0 ng/mL (ref 0.00–0.08)

## 2017-12-13 MED ORDER — FUROSEMIDE 10 MG/ML IJ SOLN
40.0000 mg | Freq: Once | INTRAMUSCULAR | Status: AC
  Administered 2017-12-13: 40 mg via INTRAVENOUS
  Filled 2017-12-13: qty 4

## 2017-12-13 MED ORDER — SPIRONOLACTONE 25 MG PO TABS: 25.0000 mg | ORAL_TABLET | Freq: Every day | ORAL | 2 refills | Status: DC

## 2017-12-13 MED ORDER — FUROSEMIDE 40 MG PO TABS: 40.0000 mg | ORAL_TABLET | Freq: Every day | ORAL | 2 refills | Status: DC

## 2017-12-13 NOTE — ED Triage Notes (Signed)
Pt states CP and SOB for several days. He reports he has not been taking his lasix X8 days due to being out. Pt states he feels as though fluid is building up. Pt talking in complete sentences but does appear SOB. Skin warm and dry.

## 2017-12-13 NOTE — ED Provider Notes (Signed)
[facility] EMERGENCY DEPARTMENT Provider Note   CSN: [id number] Arrival date & time: 12/13/17  1730     History   Chief Complaint Chief Complaint  Patient presents with  . Chest Pain  . Shortness of Breath    HPI Tyler Wong is a 51 y.o. male.   Shortness of Breath  This is a new problem. The problem occurs continuously.The current episode started more than 1 week ago. The problem has been gradually worsening. Pertinent negatives include no fever, no sore throat, no ear pain, no cough, no sputum production, no chest pain, no syncope, no vomiting, no abdominal pain and no rash. Precipitated by: Hasn't taken Lasix or spironolactone in 8days. Risk factors: CHF. He has tried nothing for the symptoms. Associated medical issues include CAD and heart failure.    Past Medical History:  Diagnosis Date  . CAD in native artery 07/17/14   STEMI- LAD stenosis with DES  . Cocaine abuse ([place])   . Dyspnea   . Hypertension   . ST elevation myocardial infarction (STEMI) involving left anterior descending (LAD) coronary artery with complication ([place])   . Tobacco abuse 07/17/2014    Patient Active Problem List   Diagnosis Date Noted  . Hepatitis C 04/13/2017  . Current non-adherence to medical treatment 04/13/2017  . Acute on chronic combined systolic (congestive) and diastolic (congestive) heart failure ([place]) 04/13/2017  . HTN (hypertension) 04/12/2017  . CAP (community acquired pneumonia) 01/24/2017  . Acute exacerbation of congestive heart failure ([place]) 01/24/2017  . CHF exacerbation ([place]) 01/24/2017  . Influenza A 01/24/2017  . Insomnia 01/18/2017  . Chronic combined systolic and diastolic congestive heart failure ([place]) 01/04/2017  . AKI (acute kidney injury) ([place]) 01/04/2017  . Hyperlipidemia 01/04/2017  . Chest pain 12/30/2016  . CAD S/P percutaneous coronary angioplasty 07/18/2014  . Tobacco abuse 07/17/2014  . Cocaine use 07/17/2014    Past Surgical  History:  Procedure Laterality Date  . CORONARY ANGIOPLASTY WITH STENT PLACEMENT  07/18/14   resolute DES to LAD STEMI  . LEFT HEART CATH N/A 07/17/2014   Procedure: LEFT HEART CATH;  Surgeon: [name], MD;  Location: [facility] CATH LAB;  Service: Cardiovascular;  Laterality: N/A;  . PERCUTANEOUS CORONARY STENT INTERVENTION (PCI-S)  07/17/2014   Procedure: PERCUTANEOUS CORONARY STENT INTERVENTION (PCI-S);  Surgeon: [name], MD;  Location: [facility] CATH LAB;  Service: Cardiovascular;;       Home Medications    Prior to Admission medications   Medication Sig Start Date End Date Taking? Authorizing Provider  albuterol (PROVENTIL HFA;VENTOLIN HFA) 108 (90 Base) MCG/ACT inhaler Inhale 2 puffs into the lungs every 4 (four) hours as needed for wheezing or shortness of breath. 09/09/17   Collier Salina, MD  aspirin 81 MG EC tablet Take 1 tablet (81 mg total) by mouth daily. 09/09/17   Rice, Resa Miner, MD  atorvastatin (LIPITOR) 80 MG tablet Take 1 tablet (80 mg total) by mouth daily. 09/09/17   Rice, Resa Miner, MD  carvedilol (COREG) 3.125 MG tablet Take 1 tablet (3.125 mg total) by mouth 2 (two) times daily with a meal. 09/09/17 12/13/17  Rice, Resa Miner, MD  furosemide (LASIX) 40 MG tablet Take 1 tablet (40 mg total) by mouth daily. 12/13/17 03/13/18  Jenny Reichmann, MD  lisinopril (PRINIVIL,ZESTRIL) 10 MG tablet Take 1 tablet (10 mg total) by mouth daily. 09/09/17 12/08/17  Collier Salina, MD  Multiple Vitamin (MULTIVITAMIN) tablet Take 1 tablet by mouth daily.  [provider]  nitroGLYCERIN (NITROSTAT) 0.4 MG SL tablet Place 1 tablet (0.4 mg total) under the tongue every 5 (five) minutes as needed for chest pain. 09/09/17   Collier Salina, MD  spironolactone (ALDACTONE) 25 MG tablet Take 1 tablet (25 mg total) by mouth daily. 12/13/17 03/13/18  Jenny Reichmann, MD    Family History Family History  Problem Relation Age of Onset  . Cirrhosis Father   . Heart  attack Mother   . Heart attack Sister   . Heart failure Sister   . Heart attack Brother     Social History Social History   Tobacco Use  . Smoking status: Current Every Day Smoker    Packs/day: 0.50    Years: 30.00    Pack years: 15.00    Types: Cigarettes  . Smokeless tobacco: Never Used  . Tobacco comment: .5 PK PER DAY  Substance Use Topics  . Alcohol use: No    Comment: Patient denies abuse, states social drinker  . Drug use: Yes    Types: Cocaine    Comment: heroin     Allergies   Patient has no known allergies.   Review of Systems Review of Systems  Constitutional: Negative for chills and fever.  HENT: Negative for ear pain and sore throat.   Eyes: Negative for pain and visual disturbance.  Respiratory: Positive for chest tightness and shortness of breath. Negative for cough and sputum production.   Cardiovascular: Negative for chest pain, palpitations and syncope.  Gastrointestinal: Negative for abdominal pain and vomiting.  Genitourinary: Negative for dysuria and hematuria.  Musculoskeletal: Negative for arthralgias and back pain.  Skin: Negative for color change and rash.  Neurological: Negative for seizures and syncope.  All other systems reviewed and are negative.    Physical Exam Updated Vital Signs BP 121/78   Pulse 99   Temp 98.6 F (37 C) (Oral)   Resp (!) 33   SpO2 93%   Physical Exam  Constitutional: He appears well-developed and well-nourished.  HENT:  Head: Normocephalic and atraumatic.  Eyes: Conjunctivae are normal.  Neck: Neck supple.  Cardiovascular: Regular rhythm. Tachycardia present.  No murmur heard. Pulmonary/Chest: Effort normal and breath sounds normal. No respiratory distress. He has no wheezes. He has no rhonchi. He has no rales.  Abdominal: Soft. There is no tenderness.  Musculoskeletal: Normal range of motion.       Right lower leg: He exhibits no edema.       Left lower leg: He exhibits no edema.  Neurological: He  is alert.  Skin: Skin is warm and dry.  Psychiatric: He has a normal mood and affect.  Nursing note and vitals reviewed.    ED Treatments / Results  Labs (all labs ordered are listed, but only abnormal results are displayed) Labs Reviewed  BASIC METABOLIC PANEL - Abnormal; Notable for the following components:      Result Value   Glucose, Bld 154 (*)    Creatinine, Ser 1.26 (*)    Calcium 8.6 (*)    All other components within normal limits  CBC - Abnormal; Notable for the following components:   HCT 38.9 (*)    All other components within normal limits  BRAIN NATRIURETIC PEPTIDE - Abnormal; Notable for the following components:   B Natriuretic Peptide 1,462.3 (*)    All other components within normal limits  I-STAT TROPONIN, ED    EKG  EKG Interpretation  Date/Time:  Monday December 13 2017 17:36:33 EST Ventricular  Rate:  110 PR Interval:  164 QRS Duration: 90 QT Interval:  372 QTC Calculation: 503 R Axis:   -62 Text Interpretation:  Sinus tachycardia with occasional Premature ventricular complexes Biatrial enlargement Left axis deviation Anterior infarct , age undetermined T wave abnormality, consider lateral ischemia Abnormal ECG since last tracing no significant change Confirmed by Malvin Johns [phone]) on 12/13/2017 5:41:41 PM       Radiology Dg Chest 2 View  Result Date: 12/13/2017 CLINICAL DATA:  Chest pain and shortness breath for several days. EXAM: CHEST  2 VIEW COMPARISON:  Chest x-rays dated 10/23/2017 and 12/30/2016. FINDINGS: Cardiomegaly is stable. Overall cardiomediastinal silhouette is stable. Mild scarring within the right upper lung, stable. Lungs otherwise clear. No pleural effusion or pneumothorax seen. Osseous structures about the chest are unremarkable. IMPRESSION: No active cardiopulmonary disease. No evidence of pneumonia or pulmonary edema. Stable cardiomegaly. Electronically Signed   By: Franki Cabot M.D.   On: 12/13/2017 18:22     Procedures Procedures (including critical care time)  Medications Ordered in ED Medications  furosemide (LASIX) injection 40 mg (40 mg Intravenous Given 12/13/17 2201)     Initial Impression / Assessment and Plan / ED Course  I have reviewed the triage vital signs and the nursing notes.  Pertinent labs & imaging results that were available during my care of the patient were reviewed by me and considered in my medical decision making (see chart for details).     Pt with h/o cocaine use, CAD s/p PCI, HTN, CHF w/EF 30-35%, tobacco abuse presents with SOB. Pt says he ran out of his Lasix ~8days ago and has the sensation that "fluid is building up" in his lungs. Says he's tried to get his PCP to fax a new prescription to his pharmacy, but hasn't had any luck doing so.   VS & exam as above. EKG: ST @ 110bpm w/biatrial enlargement & LAD; similar to prior tracings. CXR w/o PNA or pulmonary edema. Labs remarkable for BNP 1462.3 & undetectable troponin. 40mg  IV Lasix given in the ED. Pt likely symptomatic purely from not taking his medications; will write new rx for the Pt so that he can get them filled ASAP.  Explained all results to the Pt. Will discharge the Pt home with rx for Lasix & spironolactone. Recommending follow-up with PCP. ED return precautions provided. Pt acknowledged understanding of, and concurrence with the plan. All questions answered to his satisfaction. In stable condition at the time of discharge.  Final Clinical Impressions(s) / ED Diagnoses   Final diagnoses:  Shortness of breath    ED Discharge Orders        Ordered    spironolactone (ALDACTONE) 25 MG tablet  Daily    Comments:  CORRECT DOSE   12/13/17 2326    furosemide (LASIX) 40 MG tablet  Daily     12/13/17 2326       Jenny Reichmann, MD 12/13/17 5329    Pattricia Boss, MD 12/14/17 [phone]

## 2017-12-13 NOTE — ED Notes (Signed)
ED Provider at bedside. 

## 2017-12-13 NOTE — ED Notes (Signed)
Patient left at this time with all belongings. 

## 2017-12-14 ENCOUNTER — Other Ambulatory Visit: Payer: Self-pay | Admitting: Pharmacist

## 2017-12-14 DIAGNOSIS — I2510 Atherosclerotic heart disease of native coronary artery without angina pectoris: Secondary | ICD-10-CM

## 2017-12-14 DIAGNOSIS — Z9861 Coronary angioplasty status: Secondary | ICD-10-CM

## 2017-12-14 DIAGNOSIS — I5042 Chronic combined systolic (congestive) and diastolic (congestive) heart failure: Secondary | ICD-10-CM

## 2017-12-15 ENCOUNTER — Other Ambulatory Visit: Payer: Self-pay | Admitting: Internal Medicine

## 2017-12-15 DIAGNOSIS — Z9861 Coronary angioplasty status: Secondary | ICD-10-CM

## 2017-12-15 DIAGNOSIS — I2510 Atherosclerotic heart disease of native coronary artery without angina pectoris: Secondary | ICD-10-CM

## 2017-12-15 DIAGNOSIS — I5042 Chronic combined systolic (congestive) and diastolic (congestive) heart failure: Secondary | ICD-10-CM

## 2017-12-15 MED ORDER — EPLERENONE 25 MG PO TABS: 25.0000 mg | ORAL_TABLET | Freq: Every day | ORAL | 0 refills | Status: DC

## 2017-12-15 MED ORDER — CARVEDILOL 3.125 MG PO TABS: 3.1250 mg | ORAL_TABLET | Freq: Two times a day (BID) | ORAL | 3 refills | Status: DC

## 2017-12-15 MED ORDER — FUROSEMIDE 40 MG PO TABS: 40.0000 mg | ORAL_TABLET | Freq: Every day | ORAL | 0 refills | Status: DC

## 2017-12-17 ENCOUNTER — Ambulatory Visit: Payer: Self-pay

## 2017-12-17 ENCOUNTER — Encounter: Payer: Self-pay | Admitting: Internal Medicine

## 2017-12-29 ENCOUNTER — Encounter: Payer: Self-pay | Admitting: Internal Medicine

## 2018-03-02 MED FILL — FUROSEMIDE 20 MG TABS: 20 | 30 days supply | Qty: 120 | Fill #0

## 2018-04-18 MED FILL — FUROSEMIDE 20 MG TABS: 20 | 30 days supply | Qty: 120 | Fill #1

## 2018-05-31 MED FILL — FUROSEMIDE 20 MG TABS: 20 | 30 days supply | Qty: 120 | Fill #2

## 2018-07-19 ENCOUNTER — Other Ambulatory Visit: Payer: Self-pay | Admitting: Internal Medicine

## 2018-07-19 DIAGNOSIS — Z720 Tobacco use: Secondary | ICD-10-CM

## 2018-07-19 DIAGNOSIS — I5042 Chronic combined systolic (congestive) and diastolic (congestive) heart failure: Secondary | ICD-10-CM

## 2018-07-19 DIAGNOSIS — Z9861 Coronary angioplasty status: Secondary | ICD-10-CM

## 2018-07-19 DIAGNOSIS — I2510 Atherosclerotic heart disease of native coronary artery without angina pectoris: Secondary | ICD-10-CM

## 2018-08-05 ENCOUNTER — Other Ambulatory Visit: Payer: Self-pay | Admitting: Internal Medicine

## 2018-08-05 DIAGNOSIS — I5042 Chronic combined systolic (congestive) and diastolic (congestive) heart failure: Secondary | ICD-10-CM

## 2018-08-05 DIAGNOSIS — Z9861 Coronary angioplasty status: Secondary | ICD-10-CM

## 2018-08-05 DIAGNOSIS — I2510 Atherosclerotic heart disease of native coronary artery without angina pectoris: Secondary | ICD-10-CM

## 2018-08-06 ENCOUNTER — Emergency Department (HOSPITAL_COMMUNITY)
Admission: EM | Admit: 2018-08-06 | Discharge: 2018-08-06 | Disposition: A | Discharge status: Home / Self Care | Payer: Self-pay | Attending: Emergency Medicine | Admitting: Emergency Medicine

## 2018-08-06 ENCOUNTER — Emergency Department (HOSPITAL_COMMUNITY): Payer: Self-pay

## 2018-08-06 ENCOUNTER — Other Ambulatory Visit: Payer: Self-pay

## 2018-08-06 ENCOUNTER — Encounter (HOSPITAL_COMMUNITY): Payer: Self-pay | Admitting: Emergency Medicine

## 2018-08-06 DIAGNOSIS — R600 Localized edema: Secondary | ICD-10-CM | POA: Insufficient documentation

## 2018-08-06 DIAGNOSIS — R079 Chest pain, unspecified: Secondary | ICD-10-CM | POA: Insufficient documentation

## 2018-08-06 DIAGNOSIS — I110 Hypertensive heart disease with heart failure: Secondary | ICD-10-CM | POA: Insufficient documentation

## 2018-08-06 DIAGNOSIS — Z79899 Other long term (current) drug therapy: Secondary | ICD-10-CM | POA: Insufficient documentation

## 2018-08-06 DIAGNOSIS — I2510 Atherosclerotic heart disease of native coronary artery without angina pectoris: Secondary | ICD-10-CM | POA: Insufficient documentation

## 2018-08-06 DIAGNOSIS — F17210 Nicotine dependence, cigarettes, uncomplicated: Secondary | ICD-10-CM | POA: Insufficient documentation

## 2018-08-06 DIAGNOSIS — R0602 Shortness of breath: Secondary | ICD-10-CM | POA: Insufficient documentation

## 2018-08-06 DIAGNOSIS — I5042 Chronic combined systolic (congestive) and diastolic (congestive) heart failure: Secondary | ICD-10-CM | POA: Insufficient documentation

## 2018-08-06 DIAGNOSIS — F419 Anxiety disorder, unspecified: Secondary | ICD-10-CM | POA: Insufficient documentation

## 2018-08-06 DIAGNOSIS — J449 Chronic obstructive pulmonary disease, unspecified: Secondary | ICD-10-CM | POA: Insufficient documentation

## 2018-08-06 DIAGNOSIS — Z955 Presence of coronary angioplasty implant and graft: Secondary | ICD-10-CM | POA: Insufficient documentation

## 2018-08-06 DIAGNOSIS — Z7982 Long term (current) use of aspirin: Secondary | ICD-10-CM | POA: Insufficient documentation

## 2018-08-06 LAB — BASIC METABOLIC PANEL
Anion gap: 10 (ref 5–15)
BUN: 25 mg/dL — ABNORMAL HIGH (ref 6–20)
CO2: 27 mmol/L (ref 22–32)
Calcium: 9.1 mg/dL (ref 8.9–10.3)
Chloride: 102 mmol/L (ref 98–111)
Creatinine, Ser: 1.61 mg/dL — ABNORMAL HIGH (ref 0.61–1.24)
GFR calc Af Amer: 55 mL/min — ABNORMAL LOW (ref >60)
GFR calc non Af Amer: 48 mL/min — ABNORMAL LOW (ref >60)
Glucose, Bld: 117 mg/dL — ABNORMAL HIGH (ref 70–99)
Potassium: 4.1 mmol/L (ref 3.5–5.1)
Sodium: 139 mmol/L (ref 135–145)

## 2018-08-06 LAB — I-STAT TROPONIN, ED
Troponin i, poc: 0 ng/mL (ref 0.00–0.08)
Troponin i, poc: 0 ng/mL (ref 0.00–0.08)

## 2018-08-06 LAB — CBC
HCT: 45 % (ref 39.0–52.0)
Hemoglobin: 14.5 g/dL (ref 13.0–17.0)
MCH: 28.8 pg (ref 26.0–34.0)
MCHC: 32.2 g/dL (ref 30.0–36.0)
MCV: 89.5 fL (ref 78.0–100.0)
Platelets: 249 10*3/uL (ref 150–400)
RBC: 5.03 MIL/uL (ref 4.22–5.81)
RDW: 15.6 % — ABNORMAL HIGH (ref 11.5–15.5)
WBC: 8.7 10*3/uL (ref 4.0–10.5)

## 2018-08-06 LAB — BRAIN NATRIURETIC PEPTIDE: B Natriuretic Peptide: 1277.7 pg/mL — ABNORMAL HIGH (ref 0.0–100.0)

## 2018-08-06 MED ORDER — FUROSEMIDE 40 MG PO TABS: 40.0000 mg | ORAL_TABLET | Freq: Two times a day (BID) | ORAL | 1 refills | Status: DC

## 2018-08-06 MED ORDER — FUROSEMIDE 20 MG PO TABS
40.0000 mg | ORAL_TABLET | Freq: Once | ORAL | Status: AC
  Administered 2018-08-06: 40 mg via ORAL
  Filled 2018-08-06: qty 2

## 2018-08-06 NOTE — Discharge Instructions (Addendum)
Follow-up with your cardiologist as soon as possible.  Return here as needed °

## 2018-08-06 NOTE — ED Notes (Signed)
Pt stable, ambulatory, states understanding of discharge instructions 

## 2018-08-06 NOTE — ED Notes (Signed)
Pt did well ambulated on hall pt did not complain of any pain or sob nor dizzy o2 %100 and Hr 16 at most when walking

## 2018-08-06 NOTE — ED Provider Notes (Signed)
[facility] EMERGENCY DEPARTMENT Provider Note   CSN: [id number] Arrival date & time: 08/06/18  0756     History   Chief Complaint Chief Complaint  Patient presents with  . Chest Pain  . Shortness of Breath    HPI Tyler Wong is a 52 y.o. male.  HPI Patient presents to the emergency department with shortness of breath that started yesterday.  The patient states he also may have had a little chest discomfort associated with it.  Patient states that nothing seems to make the condition better or worse.  Patient states that he does have a history of COPD and heart failure.  Patient states his been out of his Lasix for several months.  Patient states that he has not been tracking his weights.  Patient states last night shortness of breath got worse and he is unable to lay flat in bed.  The patient deniesheadache,blurred vision, neck pain, fever, cough, weakness, numbness, dizziness, anorexia, edema, abdominal pain, nausea, vomiting, diarrhea, rash, back pain, dysuria, hematemesis, bloody stool, near syncope, or syncope. Past Medical History:  Diagnosis Date  . CAD in native artery 07/17/14   STEMI- LAD stenosis with DES  . Cocaine abuse ([place])   . Dyspnea   . Hypertension   . ST elevation myocardial infarction (STEMI) involving left anterior descending (LAD) coronary artery with complication ([place])   . Tobacco abuse 07/17/2014    Patient Active Problem List   Diagnosis Date Noted  . Hepatitis C 04/13/2017  . Current non-adherence to medical treatment 04/13/2017  . HTN (hypertension) 04/12/2017  . Insomnia 01/18/2017  . Chronic combined systolic and diastolic congestive heart failure ([place]) 01/04/2017  . Hyperlipidemia 01/04/2017  . Chest pain 12/30/2016  . CAD S/P percutaneous coronary angioplasty 07/18/2014  . Tobacco abuse 07/17/2014  . Cocaine use 07/17/2014    Past Surgical History:  Procedure Laterality Date  . CORONARY ANGIOPLASTY WITH STENT PLACEMENT   07/18/14   resolute DES to LAD STEMI  . LEFT HEART CATH N/A 07/17/2014   Procedure: LEFT HEART CATH;  Surgeon: [name], MD;  Location: [facility] CATH LAB;  Service: Cardiovascular;  Laterality: N/A;  . PERCUTANEOUS CORONARY STENT INTERVENTION (PCI-S)  07/17/2014   Procedure: PERCUTANEOUS CORONARY STENT INTERVENTION (PCI-S);  Surgeon: [name], MD;  Location: [facility] CATH LAB;  Service: Cardiovascular;;        Home Medications    Prior to Admission medications   Medication Sig Start Date End Date Taking? Authorizing Provider  albuterol (PROVENTIL HFA;VENTOLIN HFA) 108 (90 Base) MCG/ACT inhaler Inhale 2 puffs into the lungs every 4 (four) hours as needed for wheezing or shortness of breath. 09/09/17   Collier Salina, MD  aspirin 81 MG EC tablet Take 1 tablet (81 mg total) by mouth daily. 09/09/17   Rice, Resa Miner, MD  atorvastatin (LIPITOR) 80 MG tablet Take 1 tablet (80 mg total) by mouth daily. 09/09/17   Collier Salina, MD  carvedilol (COREG) 3.125 MG tablet Take 1 tablet (3.125 mg total) by mouth 2 (two) times daily with a meal. 12/15/17 03/15/18  Rice, Resa Miner, MD  eplerenone (INSPRA) 25 MG tablet Take 1 tablet (25 mg total) by mouth daily. Stop spironolactone 12/15/17   Rice, Resa Miner, MD  furosemide (LASIX) 40 MG tablet Take 1 tablet (40 mg total) by mouth daily. 12/15/17 03/15/18  Rice, Resa Miner, MD  lisinopril (PRINIVIL,ZESTRIL) 10 MG tablet Take 1 tablet (10 mg total) by mouth daily. 09/09/17 12/08/17  Collier Salina, MD  Multiple Vitamin (MULTIVITAMIN) tablet Take 1 tablet by mouth daily.    [provider]  NITROSTAT 0.4 MG SL tablet PLACE 1 TABLET UNDER TONGUE AS NEEDED FOR CHEST PAIN EVERY 5 MINUTES X 3 MAX DOSES.  CALL 911 IF PAIN PERSISTS 02/10/18   [name], MD    Family History Family History  Problem Relation Age of Onset  . Cirrhosis Father   . Heart attack Mother   . Heart attack Sister   . Heart failure Sister   . Heart  attack Brother     Social History Social History   Tobacco Use  . Smoking status: Current Every Day Smoker    Packs/day: 0.50    Years: 30.00    Pack years: 15.00    Types: Cigarettes  . Smokeless tobacco: Never Used  . Tobacco comment: .5 PK PER DAY  Substance Use Topics  . Alcohol use: No    Comment: Patient denies abuse, states social drinker  . Drug use: Yes    Types: Cocaine    Comment: heroin     Allergies   Patient has no known allergies.   Review of Systems Review of Systems  All other systems negative except as documented in the HPI. All pertinent positives and negatives as reviewed in the HPI. Physical Exam Updated Vital Signs BP (!) 115/91 (BP Location: Right Arm)   Pulse (!) 103   Temp 97.6 F (36.4 C) (Oral)   Resp (!) 35   Ht 5\' 10"  (1.778 m)   Wt 77.1 kg   SpO2 99%   BMI 24.39 kg/m   Physical Exam  Constitutional: He is oriented to person, place, and time. He appears well-developed and well-nourished. No distress.  HENT:  Head: Normocephalic and atraumatic.  Mouth/Throat: Oropharynx is clear and moist.  Eyes: Pupils are equal, round, and reactive to light.  Neck: Normal range of motion. Neck supple.  Cardiovascular: Normal rate, regular rhythm and normal pulses. Exam reveals no gallop, no distant heart sounds and no friction rub.  Murmur heard.  Systolic murmur is present with a grade of 4/6. The murmur is a harsh blowing type murmur  Pulmonary/Chest: Effort normal and breath sounds normal. No tachypnea. No respiratory distress. He has no decreased breath sounds. He has no wheezes. He has no rhonchi.  No specific abnormality other than harsher breath sounds noted in the bases.  Abdominal: Soft. Bowel sounds are normal. He exhibits no distension. There is no tenderness.  Musculoskeletal:  Patient has trace edema bilaterally  Neurological: He is alert and oriented to person, place, and time. He is not disoriented. He exhibits normal muscle  tone. Coordination normal.  Skin: Skin is warm and dry. Capillary refill takes less than 2 seconds. No rash noted. No erythema.  Psychiatric: He has a normal mood and affect. His behavior is normal. He is not agitated.  Nursing note and vitals reviewed.    ED Treatments / Results  Labs (all labs ordered are listed, but only abnormal results are displayed) Labs Reviewed  BASIC METABOLIC PANEL  CBC  BRAIN NATRIURETIC PEPTIDE  I-STAT TROPONIN, ED    EKG EKG Interpretation  Date/Time:  Saturday August 06 2018 08:06:29 EDT Ventricular Rate:  99 PR Interval:    QRS Duration: 96 QT Interval:  378 QTC Calculation: 486 R Axis:   -79 Text Interpretation:  Sinus tachycardia Multiple premature complexes, vent & supraven Biatrial enlargement Left ventricular hypertrophy Inferior infarct, old Anterior Q  waves, possibly due to LVH No significant change since last tracing Confirmed by Davonna Belling [phone]) on 08/06/2018 8:14:13 AM   Radiology No results found.  Procedures Procedures (including critical care time)  Medications Ordered in ED Medications - No data to display   Initial Impression / Assessment and Plan / ED Course  I have reviewed the triage vital signs and the nursing notes.  Pertinent labs & imaging results that were available during my care of the patient were reviewed by me and considered in my medical decision making (see chart for details).     Work-up will be initiated looking for congestive heart failure.  There is no wheezing or rhonchi noted on exam thus making COPD exacerbation a less likely cause.  Patient has minimal edema noted on exam and there is no rales on exam.  Patient is been saturating well in the room with no difficulties.  Patient does appear very anxious as well.  I feel that the patient will need close follow-up with his cardiologist I did give him a refill on his Lasix.  Final Clinical Impressions(s) / ED Diagnoses   Final diagnoses:    None    ED Discharge Orders    None       Dalia Heading, PA-C 08/06/18 1507    Davonna Belling, MD 08/06/18 [phone]

## 2018-08-06 NOTE — ED Triage Notes (Signed)
Patient presents to the ED with complaints of left-sided chest pain and Shortness of breath. Patient denies any nausea, vomiting dizziness.Patient reports Pain started last night and intermitted. Patient alert and oriented.

## 2018-08-16 ENCOUNTER — Other Ambulatory Visit: Payer: Self-pay | Admitting: Internal Medicine

## 2018-08-16 DIAGNOSIS — I2510 Atherosclerotic heart disease of native coronary artery without angina pectoris: Secondary | ICD-10-CM

## 2018-08-16 DIAGNOSIS — Z9861 Coronary angioplasty status: Secondary | ICD-10-CM

## 2018-08-16 DIAGNOSIS — I5042 Chronic combined systolic (congestive) and diastolic (congestive) heart failure: Secondary | ICD-10-CM

## 2018-08-18 ENCOUNTER — Other Ambulatory Visit: Payer: Self-pay | Admitting: Internal Medicine

## 2018-08-18 DIAGNOSIS — I2510 Atherosclerotic heart disease of native coronary artery without angina pectoris: Secondary | ICD-10-CM

## 2018-08-18 DIAGNOSIS — Z9861 Coronary angioplasty status: Secondary | ICD-10-CM

## 2018-08-18 DIAGNOSIS — I5042 Chronic combined systolic (congestive) and diastolic (congestive) heart failure: Secondary | ICD-10-CM

## 2018-09-10 ENCOUNTER — Emergency Department (HOSPITAL_COMMUNITY)
Admission: EM | Admit: 2018-09-10 | Discharge: 2018-09-10 | Disposition: A | Discharge status: Home / Self Care | Payer: Medicaid Other | Attending: Emergency Medicine | Admitting: Emergency Medicine

## 2018-09-10 ENCOUNTER — Other Ambulatory Visit: Payer: Self-pay

## 2018-09-10 ENCOUNTER — Emergency Department (HOSPITAL_COMMUNITY): Payer: Medicaid Other

## 2018-09-10 ENCOUNTER — Encounter (HOSPITAL_COMMUNITY): Payer: Self-pay | Admitting: Emergency Medicine

## 2018-09-10 DIAGNOSIS — I2510 Atherosclerotic heart disease of native coronary artery without angina pectoris: Secondary | ICD-10-CM | POA: Diagnosis not present

## 2018-09-10 DIAGNOSIS — Z7982 Long term (current) use of aspirin: Secondary | ICD-10-CM | POA: Insufficient documentation

## 2018-09-10 DIAGNOSIS — Z79899 Other long term (current) drug therapy: Secondary | ICD-10-CM | POA: Diagnosis not present

## 2018-09-10 DIAGNOSIS — I10 Essential (primary) hypertension: Secondary | ICD-10-CM | POA: Insufficient documentation

## 2018-09-10 DIAGNOSIS — R0602 Shortness of breath: Secondary | ICD-10-CM | POA: Diagnosis present

## 2018-09-10 DIAGNOSIS — J449 Chronic obstructive pulmonary disease, unspecified: Secondary | ICD-10-CM | POA: Diagnosis not present

## 2018-09-10 DIAGNOSIS — I509 Heart failure, unspecified: Secondary | ICD-10-CM | POA: Diagnosis not present

## 2018-09-10 DIAGNOSIS — F17210 Nicotine dependence, cigarettes, uncomplicated: Secondary | ICD-10-CM | POA: Insufficient documentation

## 2018-09-10 HISTORY — DX: Heart failure, unspecified: I50.9

## 2018-09-10 HISTORY — DX: Chronic obstructive pulmonary disease, unspecified: J44.9

## 2018-09-10 LAB — BASIC METABOLIC PANEL
Anion gap: 9 (ref 5–15)
BUN: 26 mg/dL — ABNORMAL HIGH (ref 6–20)
CO2: 24 mmol/L (ref 22–32)
Calcium: 8.9 mg/dL (ref 8.9–10.3)
Chloride: 107 mmol/L (ref 98–111)
Creatinine, Ser: 1.42 mg/dL — ABNORMAL HIGH (ref 0.61–1.24)
GFR calc Af Amer: >60 mL/min (ref >60)
GFR calc non Af Amer: 55 mL/min — ABNORMAL LOW (ref >60)
Glucose, Bld: 114 mg/dL — ABNORMAL HIGH (ref 70–99)
Potassium: 3.8 mmol/L (ref 3.5–5.1)
Sodium: 140 mmol/L (ref 135–145)

## 2018-09-10 LAB — URINALYSIS, ROUTINE W REFLEX MICROSCOPIC
Bacteria, UA: NONE SEEN
Bilirubin Urine: NEGATIVE
Glucose, UA: NEGATIVE mg/dL
Hgb urine dipstick: NEGATIVE
Ketones, ur: NEGATIVE mg/dL
Leukocytes, UA: NEGATIVE
Nitrite: NEGATIVE
Protein, ur: 30 mg/dL — AB
Specific Gravity, Urine: 1.027 (ref 1.005–1.030)
pH: 5 (ref 5.0–8.0)

## 2018-09-10 LAB — CBC
HCT: 43.3 % (ref 39.0–52.0)
Hemoglobin: 13.9 g/dL (ref 13.0–17.0)
MCH: 28.8 pg (ref 26.0–34.0)
MCHC: 32.1 g/dL (ref 30.0–36.0)
MCV: 89.8 fL (ref 78.0–100.0)
Platelets: 215 10*3/uL (ref 150–400)
RBC: 4.82 MIL/uL (ref 4.22–5.81)
RDW: 15.5 % (ref 11.5–15.5)
WBC: 7.2 10*3/uL (ref 4.0–10.5)

## 2018-09-10 LAB — HEPATIC FUNCTION PANEL
ALT: 41 U/L (ref 0–44)
AST: 22 U/L (ref 15–41)
Albumin: 2.9 g/dL — ABNORMAL LOW (ref 3.5–5.0)
Alkaline Phosphatase: 81 U/L (ref 38–126)
Bilirubin, Direct: 0.1 mg/dL (ref 0.0–0.2)
Indirect Bilirubin: 0.8 mg/dL (ref 0.3–0.9)
Total Bilirubin: 0.9 mg/dL (ref 0.3–1.2)
Total Protein: 5.7 g/dL — ABNORMAL LOW (ref 6.5–8.1)

## 2018-09-10 LAB — LIPASE, BLOOD: Lipase: 39 U/L (ref 11–51)

## 2018-09-10 LAB — RAPID URINE DRUG SCREEN, HOSP PERFORMED
Amphetamines: NOT DETECTED
Barbiturates: NOT DETECTED
Benzodiazepines: NOT DETECTED
Cocaine: POSITIVE — AB
Opiates: NOT DETECTED
Tetrahydrocannabinol: NOT DETECTED

## 2018-09-10 LAB — I-STAT CG4 LACTIC ACID, ED
Lactic Acid, Venous: 1.95 mmol/L — ABNORMAL HIGH (ref 0.5–1.9)
Lactic Acid, Venous: 1.39 mmol/L (ref 0.5–1.9)

## 2018-09-10 LAB — MAGNESIUM: Magnesium: 1.8 mg/dL (ref 1.7–2.4)

## 2018-09-10 LAB — I-STAT TROPONIN, ED: Troponin i, poc: 0.01 ng/mL (ref 0.00–0.08)

## 2018-09-10 LAB — BRAIN NATRIURETIC PEPTIDE: B Natriuretic Peptide: 1637.1 pg/mL — ABNORMAL HIGH (ref 0.0–100.0)

## 2018-09-10 LAB — D-DIMER, QUANTITATIVE (NOT AT ARMC): D-Dimer, Quant: <0.27 ug/mL-FEU (ref 0.00–0.50)

## 2018-09-10 MED ORDER — IPRATROPIUM-ALBUTEROL 0.5-2.5 (3) MG/3ML IN SOLN
3.0000 mL | Freq: Once | RESPIRATORY_TRACT | Status: AC
  Administered 2018-09-10: 3 mL via RESPIRATORY_TRACT
  Filled 2018-09-10: qty 3

## 2018-09-10 MED ORDER — FUROSEMIDE 40 MG PO TABS: 40.0000 mg | ORAL_TABLET | Freq: Two times a day (BID) | ORAL | 0 refills | Status: DC

## 2018-09-10 MED ORDER — FUROSEMIDE 10 MG/ML IJ SOLN
40.0000 mg | Freq: Once | INTRAMUSCULAR | Status: AC
  Administered 2018-09-10: 40 mg via INTRAVENOUS
  Filled 2018-09-10: qty 4

## 2018-09-10 NOTE — ED Triage Notes (Signed)
Pt ran out of lasix 3 days ago.  Feels fluid built up in chest.  Very short of breath and coughing.  Smokes and cocoaine.  No CP Burning in chest.

## 2018-09-10 NOTE — ED Notes (Signed)
Patient standing by side of bed stating "it's easier to breath this way". Patient placed back into bed for treatment.

## 2018-09-10 NOTE — ED Provider Notes (Signed)
[place] EMERGENCY DEPARTMENT Provider Note   CSN: [id number] Arrival date & time: 09/10/18  0700     History   Chief Complaint Chief Complaint  Patient presents with  . Shortness of Breath    HPI Tyler Wong is a 52 y.o. male.  The history is provided by the patient and medical records. No language interpreter was used.  Shortness of Breath  This is a recurrent problem. The average episode lasts 3 days. The problem occurs continuously.The current episode started more than 2 days ago. The problem has been gradually worsening. Associated symptoms include cough, sputum production, orthopnea and leg swelling (bialteral and mild). Pertinent negatives include no fever, no headaches, no coryza, no rhinorrhea, no neck pain, no hemoptysis, no wheezing, no chest pain, no syncope, no vomiting, no abdominal pain and no leg pain. It is unknown what precipitated the problem. He has tried nothing for the symptoms. The treatment provided no relief. Associated medical issues include COPD, CAD, heart failure and past MI.    Past Medical History:  Diagnosis Date  . CAD in native artery 07/17/14   STEMI- LAD stenosis with DES  . CHF (congestive heart failure) ([place])   . Cocaine abuse ([place])   . COPD (chronic obstructive pulmonary disease) ([place])   . Dyspnea   . Hypertension   . ST elevation myocardial infarction (STEMI) involving left anterior descending (LAD) coronary artery with complication ([place])   . Tobacco abuse 07/17/2014    Patient Active Problem List   Diagnosis Date Noted  . Hepatitis C 04/13/2017  . Current non-adherence to medical treatment 04/13/2017  . HTN (hypertension) 04/12/2017  . Insomnia 01/18/2017  . Chronic combined systolic and diastolic congestive heart failure ([place]) 01/04/2017  . Hyperlipidemia 01/04/2017  . Chest pain 12/30/2016  . CAD S/P percutaneous coronary angioplasty 07/18/2014  . Tobacco abuse 07/17/2014  . Cocaine use 07/17/2014     Past Surgical History:  Procedure Laterality Date  . CORONARY ANGIOPLASTY WITH STENT PLACEMENT  07/18/14   resolute DES to LAD STEMI  . LEFT HEART CATH N/A 07/17/2014   Procedure: LEFT HEART CATH;  Surgeon: [name], MD;  Location: [facility] CATH LAB;  Service: Cardiovascular;  Laterality: N/A;  . PERCUTANEOUS CORONARY STENT INTERVENTION (PCI-S)  07/17/2014   Procedure: PERCUTANEOUS CORONARY STENT INTERVENTION (PCI-S);  Surgeon: [name], MD;  Location: [facility] CATH LAB;  Service: Cardiovascular;;        Home Medications    Prior to Admission medications   Medication Sig Start Date End Date Taking? Authorizing Provider  albuterol (PROVENTIL HFA;VENTOLIN HFA) 108 (90 Base) MCG/ACT inhaler Inhale 2 puffs into the lungs every 4 (four) hours as needed for wheezing or shortness of breath. 09/09/17   Collier Salina, MD  aspirin 81 MG EC tablet Take 1 tablet (81 mg total) by mouth daily. 09/09/17   Rice, Resa Miner, MD  atorvastatin (LIPITOR) 80 MG tablet Take 1 tablet (80 mg total) by mouth daily. 09/09/17   Collier Salina, MD  carvedilol (COREG) 3.125 MG tablet Take 1 tablet (3.125 mg total) by mouth 2 (two) times daily with a meal. 12/15/17 08/06/18  Rice, Resa Miner, MD  eplerenone (INSPRA) 25 MG tablet Take 1 tablet (25 mg total) by mouth daily. Stop spironolactone Patient not taking: Reported on 08/06/2018 12/15/17   Collier Salina, MD  furosemide (LASIX) 40 MG tablet Take 1 tablet (40 mg total) by mouth daily. 12/15/17 08/06/18  Collier Salina, MD  furosemide (LASIX) 40 MG tablet Take 1 tablet (40 mg total) by mouth 2 (two) times daily. 08/06/18   Lawyer, Harrell Gave, PA-C  lisinopril (PRINIVIL,ZESTRIL) 10 MG tablet Take 1 tablet (10 mg total) by mouth daily. 09/09/17 08/06/18  Collier Salina, MD  Multiple Vitamin (MULTIVITAMIN) tablet Take 1 tablet by mouth daily.    [provider]  NITROSTAT 0.4 MG SL tablet PLACE 1 TABLET UNDER TONGUE AS NEEDED FOR  CHEST PAIN EVERY 5 MINUTES X 3 MAX DOSES.  CALL 911 IF PAIN PERSISTS Patient taking differently: Place 0.4 mg under the tongue every 5 (five) minutes as needed for chest pain.  02/10/18   [name], MD    Family History Family History  Problem Relation Age of Onset  . Cirrhosis Father   . Heart attack Mother   . Heart attack Sister   . Heart failure Sister   . Heart attack Brother     Social History Social History   Tobacco Use  . Smoking status: Current Every Day Smoker    Packs/day: 0.50    Years: 30.00    Pack years: 15.00    Types: Cigarettes  . Smokeless tobacco: Never Used  . Tobacco comment: .5 PK PER DAY  Substance Use Topics  . Alcohol use: No    Comment: Patient denies abuse, states social drinker  . Drug use: Yes    Types: Cocaine    Comment: heroin     Allergies   Patient has no known allergies.   Review of Systems Review of Systems  Constitutional: Positive for chills and fatigue. Negative for diaphoresis and fever.  HENT: Negative for congestion and rhinorrhea.   Eyes: Negative for visual disturbance.  Respiratory: Positive for cough, sputum production, chest tightness and shortness of breath. Negative for hemoptysis, choking, wheezing and stridor.   Cardiovascular: Positive for orthopnea and leg swelling (bialteral and mild). Negative for chest pain, palpitations and syncope.  Gastrointestinal: Negative for abdominal pain, constipation, diarrhea, nausea and vomiting.  Genitourinary: Negative for dysuria and flank pain.  Musculoskeletal: Negative for back pain, neck pain and neck stiffness.  Neurological: Negative for light-headedness and headaches.  Psychiatric/Behavioral: Negative for agitation.  All other systems reviewed and are negative.    Physical Exam Updated Vital Signs Pulse (!) 106   Temp 98.1 F (36.7 C) (Oral)   Resp (!) 26   Ht 5\' 10"  (1.778 m)   Wt 79.8 kg   SpO2 98%   BMI 25.25 kg/m   Physical Exam  Constitutional:  He appears well-developed and well-nourished.  Non-toxic appearance. He does not appear ill. No distress.  HENT:  Head: Normocephalic and atraumatic.  Eyes: Pupils are equal, round, and reactive to light. Conjunctivae are normal.  Neck: Normal range of motion. Neck supple.  Cardiovascular: Regular rhythm and normal heart sounds. Tachycardia present.  No murmur heard. Pulmonary/Chest: Effort normal. No stridor. Tachypnea noted. No bradypnea. No respiratory distress. He has rhonchi. He has rales in the right lower field and the left lower field.  Abdominal: Soft. He exhibits no distension. There is no tenderness. There is no guarding.  Musculoskeletal:       Right lower leg: He exhibits edema.       Left lower leg: He exhibits edema.  Neurological: He is alert.  Skin: Skin is warm and dry. No erythema. No pallor.  Psychiatric: He has a normal mood and affect.  Nursing note and vitals reviewed.    ED Treatments / Results  Labs (all labs ordered are listed, but only abnormal results are displayed) Labs Reviewed  BASIC METABOLIC PANEL - Abnormal; Notable for the following components:      Result Value   Glucose, Bld 114 (*)    BUN 26 (*)    Creatinine, Ser 1.42 (*)    GFR calc non Af Amer 55 (*)    All other components within normal limits  BRAIN NATRIURETIC PEPTIDE - Abnormal; Notable for the following components:   B Natriuretic Peptide 1,637.1 (*)    All other components within normal limits  HEPATIC FUNCTION PANEL - Abnormal; Notable for the following components:   Total Protein 5.7 (*)    Albumin 2.9 (*)    All other components within normal limits  RAPID URINE DRUG SCREEN, HOSP PERFORMED - Abnormal; Notable for the following components:   Cocaine POSITIVE (*)    All other components within normal limits  URINALYSIS, ROUTINE W REFLEX MICROSCOPIC - Abnormal; Notable for the following components:   Protein, ur 30 (*)    All other components within normal limits  I-STAT CG4  LACTIC ACID, ED - Abnormal; Notable for the following components:   Lactic Acid, Venous 1.95 (*)    All other components within normal limits  URINE CULTURE  CBC  LIPASE, BLOOD  D-DIMER, QUANTITATIVE (NOT AT [facility])  MAGNESIUM  I-STAT TROPONIN, ED  I-STAT CG4 LACTIC ACID, ED    EKG EKG Interpretation  Date/Time:  Saturday September 10 2018 07:04:36 EDT Ventricular Rate:  96 PR Interval:  176 QRS Duration: 92 QT Interval:  386 QTC Calculation: 487 R Axis:   -59 Text Interpretation:  Sinus rhythm with occasional Premature ventricular complexes Biatrial enlargement Left anterior fascicular block Anterior infarct , age undetermined Abnormal ECG When compared to prior, no significant changes seen.  No STEMI Confirmed by Antony Blackbird [phone]) on 09/10/2018 7:24:02 AM   Radiology Dg Chest 2 View  Result Date: 09/10/2018 CLINICAL DATA:  Shortness of breath for 1 day EXAM: CHEST - 2 VIEW COMPARISON:  08/06/2018 FINDINGS: Cardiac shadow remains enlarged. The lungs are well aerated bilaterally with some mild scarring in the right upper lobe. No focal infiltrate or sizable effusion is seen. Minimal scarring is noted posteriorly in the right lung base stable from the previous exam as well as a prior CT from 2018. No bony abnormality is noted. IMPRESSION: Mild chronic scarring without acute abnormality. Electronically Signed   By: Inez Catalina M.D.   On: 09/10/2018 07:54    Procedures Procedures (including critical care time)  Medications Ordered in ED Medications  ipratropium-albuterol (DUONEB) 0.5-2.5 (3) MG/3ML nebulizer solution 3 mL (3 mLs Nebulization Given 09/10/18 0853)  furosemide (LASIX) injection 40 mg (40 mg Intravenous Given 09/10/18 1026)     Initial Impression / Assessment and Plan / ED Course  I have reviewed the triage vital signs and the nursing notes.  Pertinent labs & imaging results that were available during my care of the patient were reviewed by me and considered in  my medical decision making (see chart for details).     Ghazi Rumpf is a 52 y.o. male with a past medical history significant for polysubstance abuse, COPD, CAD status post PCI, hypertension, hyperlipidemia, CHF, and hepatitis C who presents with shortness of breath and cough.  Patient reports that he has had symptoms for the last few days worsening.  He reports that he did run out of Lasix 3 days ago and has not been taking it.  He  thinks that his legs have been slightly edematous bilaterally and the fluid is going into his lungs.  He reports his shortness of breath is exertional and he has had a productive cough with phlegm.  No hemoptysis.  He denies any chest pain or chest tightness at this time.  He denies any nausea, vomiting, constipation, diarrhea, or dysuria.  No recent trauma.    On exam, patient does have some coarse breath sounds and crackles in the bases bilaterally.  Abdomen and chest are nontender.  Legs have very mild edema.  Patient symmetric pulses in upper and lower tremors.  Patient denies recent drug use.    Clinically I am most concerned about CHF exacerbation or pneumonia given the productive cough.  With his tachycardia and shortness of breath, patient will have a d-dimer ordered to help rule out PE as the etiology of the tachycardia and shortness of breath.  Anticipate reassessment after work-up.  Patient has reassuring work-up and is able to ambulate without hypoxia, and with improvement in his vital signs, patient may be a candidate for discharge home.  If any significant abnormality are discovered, patient may require admission.     8:47 AM On reassessment, patient is more tachypneic.  He was reassessed and he was having some wheezing.  DuoNeb will be given.  Patient still awaiting results of BNP.  Other laboratory testing does not show elevation of troponin or d-dimer.  Doubt PE.  Chest x-ray did not show evidence of pneumonia or frank pulmonary edema however crackles were  still appreciated in the bases.  BNP elevated at 1600.  This appears to be worse than prior.    Patient says she does not want to be admitted but wants an IV dose of Lasix and then reassess.  Shared decision making conversation was held with patient and he does not want admission.  Patient given dose of Lasix and had significant urine output.  He is already reporting feeling better.  He would like his Lasix refilled orally and he will follow-up with his primary doctor.  He reports that he will return if any symptoms change or worsen.  Patient understands risks of death for CHF exacerbation if he leaves.  Patient does not want to be admitted.   Patient is able to ablate without difficulty and had no hypoxia or dyspnea.  Patient will was discharged in stable condition with improving symptoms.  Final Clinical Impressions(s) / ED Diagnoses   Final diagnoses:  Acute on chronic congestive heart failure, unspecified heart failure type (HCC)  SOB (shortness of breath)    ED Discharge Orders         Ordered    furosemide (LASIX) 40 MG tablet  2 times daily     09/10/18 1445          Clinical Impression: 1. Acute on chronic congestive heart failure, unspecified heart failure type ([place])   2. SOB (shortness of breath)     Disposition: Discharge  Condition: Good  I have discussed the results, Dx and Tx plan with the pt(& family if present). He/she/they expressed understanding and agree(s) with the plan. Discharge instructions discussed at great length. Strict return precautions discussed and pt &/or family have verbalized understanding of the instructions. No further questions at time of discharge.    Discharge Medication List as of 09/10/2018  2:48 PM      Follow Up: [place] [id number] [phone] Schedule an appointment  as soon as possible for a visit    [facility] [address] [place]       Silus Lanzo, Gwenyth Allegra, MD 09/10/18 [phone]

## 2018-09-10 NOTE — ED Notes (Signed)
Pt alert and oriented in NAD. Pt verbilized understanding of discharge instructions.

## 2018-09-10 NOTE — Discharge Instructions (Signed)
Your work-up today showed worsened fluid overload from your congestive heart failure and being out of your diuretic medicine, Lasix.  We discussed admitting you to the hospital due to the shortness of breath however you did not want this.  Please follow-up with a primary care physician for further CHF management.  Please take the Lasix we are prescribing.  If any symptoms change or worsen, please return to the nearest emergency department.

## 2018-09-10 NOTE — ED Notes (Signed)
Pt pulled into hallway.

## 2018-09-10 NOTE — ED Notes (Signed)
Ambulated pt in hallway with pulse ox. Pts oxygen went between 90-96 with a good pleth. Pts respirations were slightly elevated but pt was walking "faster" than he normally would because he wanted to get it over with and eat something. Pt denied feeling short of breath during and after ambulation. Pt was given food.

## 2018-09-10 NOTE — ED Notes (Signed)
Nurse request to get the blood from patient he is starting  put in an IV.

## 2018-09-11 LAB — URINE CULTURE: Culture: NO GROWTH

## 2018-10-02 ENCOUNTER — Other Ambulatory Visit: Payer: Self-pay

## 2018-10-02 ENCOUNTER — Inpatient Hospital Stay (HOSPITAL_COMMUNITY)
Admission: EM | Admit: 2018-10-02 | Discharge: 2018-10-04 | DRG: 287 | Disposition: A | Discharge status: Home / Self Care | Payer: Medicaid Other | Attending: Oncology | Admitting: Oncology

## 2018-10-02 ENCOUNTER — Encounter (HOSPITAL_COMMUNITY): Payer: Self-pay | Admitting: *Deleted

## 2018-10-02 ENCOUNTER — Inpatient Hospital Stay (HOSPITAL_COMMUNITY): Payer: Medicaid Other

## 2018-10-02 ENCOUNTER — Emergency Department (HOSPITAL_COMMUNITY): Payer: Medicaid Other

## 2018-10-02 DIAGNOSIS — Z8249 Family history of ischemic heart disease and other diseases of the circulatory system: Secondary | ICD-10-CM

## 2018-10-02 DIAGNOSIS — Z955 Presence of coronary angioplasty implant and graft: Secondary | ICD-10-CM | POA: Diagnosis not present

## 2018-10-02 DIAGNOSIS — I361 Nonrheumatic tricuspid (valve) insufficiency: Secondary | ICD-10-CM

## 2018-10-02 DIAGNOSIS — F1410 Cocaine abuse, uncomplicated: Secondary | ICD-10-CM | POA: Diagnosis present

## 2018-10-02 DIAGNOSIS — F17210 Nicotine dependence, cigarettes, uncomplicated: Secondary | ICD-10-CM | POA: Diagnosis present

## 2018-10-02 DIAGNOSIS — I2510 Atherosclerotic heart disease of native coronary artery without angina pectoris: Secondary | ICD-10-CM | POA: Diagnosis present

## 2018-10-02 DIAGNOSIS — J449 Chronic obstructive pulmonary disease, unspecified: Secondary | ICD-10-CM | POA: Diagnosis present

## 2018-10-02 DIAGNOSIS — E785 Hyperlipidemia, unspecified: Secondary | ICD-10-CM | POA: Diagnosis present

## 2018-10-02 DIAGNOSIS — Z9114 Patient's other noncompliance with medication regimen: Secondary | ICD-10-CM | POA: Diagnosis not present

## 2018-10-02 DIAGNOSIS — Z79899 Other long term (current) drug therapy: Secondary | ICD-10-CM | POA: Diagnosis not present

## 2018-10-02 DIAGNOSIS — I252 Old myocardial infarction: Secondary | ICD-10-CM

## 2018-10-02 DIAGNOSIS — Z7982 Long term (current) use of aspirin: Secondary | ICD-10-CM

## 2018-10-02 DIAGNOSIS — I110 Hypertensive heart disease with heart failure: Secondary | ICD-10-CM | POA: Diagnosis present

## 2018-10-02 DIAGNOSIS — I5043 Acute on chronic combined systolic (congestive) and diastolic (congestive) heart failure: Secondary | ICD-10-CM | POA: Diagnosis present

## 2018-10-02 DIAGNOSIS — I5041 Acute combined systolic (congestive) and diastolic (congestive) heart failure: Secondary | ICD-10-CM | POA: Diagnosis present

## 2018-10-02 LAB — BASIC METABOLIC PANEL
Anion gap: 9 (ref 5–15)
BUN: 20 mg/dL (ref 6–20)
CO2: 22 mmol/L (ref 22–32)
Calcium: 8.5 mg/dL — ABNORMAL LOW (ref 8.9–10.3)
Chloride: 109 mmol/L (ref 98–111)
Creatinine, Ser: 1.42 mg/dL — ABNORMAL HIGH (ref 0.61–1.24)
GFR calc Af Amer: >60 mL/min (ref >60)
GFR calc non Af Amer: 55 mL/min — ABNORMAL LOW (ref >60)
Glucose, Bld: 162 mg/dL — ABNORMAL HIGH (ref 70–99)
Potassium: 4.1 mmol/L (ref 3.5–5.1)
Sodium: 140 mmol/L (ref 135–145)

## 2018-10-02 LAB — CBC
HCT: 41.9 % (ref 39.0–52.0)
Hemoglobin: 13.6 g/dL (ref 13.0–17.0)
MCH: 29.1 pg (ref 26.0–34.0)
MCHC: 32.5 g/dL (ref 30.0–36.0)
MCV: 89.7 fL (ref 78.0–100.0)
Platelets: 207 10*3/uL (ref 150–400)
RBC: 4.67 MIL/uL (ref 4.22–5.81)
RDW: 15.7 % — ABNORMAL HIGH (ref 11.5–15.5)
WBC: 7 10*3/uL (ref 4.0–10.5)

## 2018-10-02 LAB — TROPONIN I: Troponin I: <0.03 ng/mL (ref <0.03)

## 2018-10-02 LAB — ECHOCARDIOGRAM COMPLETE

## 2018-10-02 LAB — RENAL FUNCTION PANEL
Albumin: 3 g/dL — ABNORMAL LOW (ref 3.5–5.0)
Anion gap: 7 (ref 5–15)
BUN: 17 mg/dL (ref 6–20)
CO2: 28 mmol/L (ref 22–32)
Calcium: 8.4 mg/dL — ABNORMAL LOW (ref 8.9–10.3)
Chloride: 103 mmol/L (ref 98–111)
Creatinine, Ser: 1.41 mg/dL — ABNORMAL HIGH (ref 0.61–1.24)
GFR calc Af Amer: >60 mL/min (ref >60)
GFR calc non Af Amer: 56 mL/min — ABNORMAL LOW (ref >60)
Glucose, Bld: 85 mg/dL (ref 70–99)
Phosphorus: 2.7 mg/dL (ref 2.5–4.6)
Potassium: 3.8 mmol/L (ref 3.5–5.1)
Sodium: 138 mmol/L (ref 135–145)

## 2018-10-02 LAB — HEPATIC FUNCTION PANEL
ALT: 38 U/L (ref 0–44)
AST: 24 U/L (ref 15–41)
Albumin: 3.1 g/dL — ABNORMAL LOW (ref 3.5–5.0)
Alkaline Phosphatase: 80 U/L (ref 38–126)
Bilirubin, Direct: 0.2 mg/dL (ref 0.0–0.2)
Indirect Bilirubin: 0.5 mg/dL (ref 0.3–0.9)
Total Bilirubin: 0.7 mg/dL (ref 0.3–1.2)
Total Protein: 6.4 g/dL — ABNORMAL LOW (ref 6.5–8.1)

## 2018-10-02 LAB — MAGNESIUM: Magnesium: 2 mg/dL (ref 1.7–2.4)

## 2018-10-02 LAB — BRAIN NATRIURETIC PEPTIDE: B Natriuretic Peptide: 1660.5 pg/mL — ABNORMAL HIGH (ref 0.0–100.0)

## 2018-10-02 LAB — LIPASE, BLOOD: Lipase: 39 U/L (ref 11–51)

## 2018-10-02 MED ORDER — FUROSEMIDE 10 MG/ML IJ SOLN
40.0000 mg | Freq: Once | INTRAMUSCULAR | Status: AC
  Administered 2018-10-02: 40 mg via INTRAVENOUS
  Filled 2018-10-02: qty 4

## 2018-10-02 MED ORDER — ENOXAPARIN SODIUM 40 MG/0.4ML ~~LOC~~ SOLN
40.0000 mg | SUBCUTANEOUS | Status: DC
  Administered 2018-10-03: 40 mg via SUBCUTANEOUS
  Filled 2018-10-02 (×2): qty 0.4

## 2018-10-02 MED ORDER — ACETAMINOPHEN 325 MG PO TABS: 650.0000 mg | ORAL_TABLET | Freq: Four times a day (QID) | ORAL | Status: DC | PRN

## 2018-10-02 MED ORDER — SENNOSIDES-DOCUSATE SODIUM 8.6-50 MG PO TABS: 1.0000 | ORAL_TABLET | Freq: Every evening | ORAL | Status: DC | PRN

## 2018-10-02 MED ORDER — FUROSEMIDE 10 MG/ML IJ SOLN: 40.0000 mg | Freq: Once | INTRAMUSCULAR | Status: DC

## 2018-10-02 MED ORDER — ASPIRIN 81 MG PO CHEW
324.0000 mg | CHEWABLE_TABLET | Freq: Once | ORAL | Status: AC
  Administered 2018-10-02: 324 mg via ORAL
  Filled 2018-10-02: qty 4

## 2018-10-02 MED ORDER — ACETAMINOPHEN 650 MG RE SUPP: 650.0000 mg | Freq: Four times a day (QID) | RECTAL | Status: DC | PRN

## 2018-10-02 MED ORDER — POTASSIUM CHLORIDE CRYS ER 20 MEQ PO TBCR
40.0000 meq | EXTENDED_RELEASE_TABLET | Freq: Once | ORAL | Status: AC
  Administered 2018-10-02: 40 meq via ORAL
  Filled 2018-10-02: qty 2

## 2018-10-02 NOTE — ED Provider Notes (Signed)
[facility]ERGENCY DEPARTMENT Provider Note   CSN: [id number] Arrival date & time: 10/02/18  0154     History   Chief Complaint Chief Complaint  Patient presents with  . COPD  . Abdominal Pain    HPI Tyler Wong is a 52 y.o. male.  The history is provided by the patient.  He has history of hypertension, COPD, coronary artery disease, combined systolic and diastolic heart failure and comes in with difficulty breathing over the last 3 days.  He states that his prescription for furosemide had run out, and he was unable to get it refilled because he does not have a primary care provider.  His legs have been swollen, but the swelling in the legs has gone down, and now he feels that his abdomen is swollen.  He states his difficulty breathing is usually not a problem in his legs are swollen.  Dyspnea is worse when he lays flat.  He denies chest pain, heaviness, tightness, pressure.  He is complaining of some fullness in his abdomen.  There is been no nausea, vomiting, diaphoresis.  Past Medical History:  Diagnosis Date  . CAD in native artery 07/17/14   STEMI- LAD stenosis with DES  . CHF (congestive heart failure) ([place])   . Cocaine abuse ([place])   . COPD (chronic obstructive pulmonary disease) ([place])   . Dyspnea   . Hypertension   . ST elevation myocardial infarction (STEMI) involving left anterior descending (LAD) coronary artery with complication ([place])   . Tobacco abuse 07/17/2014    Patient Active Problem List   Diagnosis Date Noted  . Hepatitis C 04/13/2017  . Current non-adherence to medical treatment 04/13/2017  . HTN (hypertension) 04/12/2017  . Insomnia 01/18/2017  . Chronic combined systolic and diastolic congestive heart failure ([place]) 01/04/2017  . Hyperlipidemia 01/04/2017  . Chest pain 12/30/2016  . CAD S/P percutaneous coronary angioplasty 07/18/2014  . Tobacco abuse 07/17/2014  . Cocaine use 07/17/2014    Past Surgical History:  Procedure  Laterality Date  . CORONARY ANGIOPLASTY WITH STENT PLACEMENT  07/18/14   resolute DES to LAD STEMI  . LEFT HEART CATH N/A 07/17/2014   Procedure: LEFT HEART CATH;  Surgeon: [name], MD;  Location: [facility] CATH LAB;  Service: Cardiovascular;  Laterality: N/A;  . PERCUTANEOUS CORONARY STENT INTERVENTION (PCI-S)  07/17/2014   Procedure: PERCUTANEOUS CORONARY STENT INTERVENTION (PCI-S);  Surgeon: [name], MD;  Location: [facility] CATH LAB;  Service: Cardiovascular;;        Home Medications    Prior to Admission medications   Medication Sig Start Date End Date Taking? Authorizing Provider  albuterol (PROVENTIL HFA;VENTOLIN HFA) 108 (90 Base) MCG/ACT inhaler Inhale 2 puffs into the lungs every 4 (four) hours as needed for wheezing or shortness of breath. 09/09/17   Collier Salina, MD  aspirin 81 MG EC tablet Take 1 tablet (81 mg total) by mouth daily. 09/09/17   Rice, Resa Miner, MD  atorvastatin (LIPITOR) 80 MG tablet Take 1 tablet (80 mg total) by mouth daily. 09/09/17   Collier Salina, MD  carvedilol (COREG) 3.125 MG tablet Take 1 tablet (3.125 mg total) by mouth 2 (two) times daily with a meal. 12/15/17 09/10/18  Rice, Resa Miner, MD  eplerenone (INSPRA) 25 MG tablet Take 1 tablet (25 mg total) by mouth daily. Stop spironolactone Patient not taking: Reported on 08/06/2018 12/15/17   Collier Salina, MD  furosemide (LASIX) 40 MG tablet Take 1 tablet (40 mg  total) by mouth daily. Patient not taking: Reported on 09/10/2018 12/15/17 08/06/18  Collier Salina, MD  furosemide (LASIX) 40 MG tablet Take 1 tablet (40 mg total) by mouth 2 (two) times daily. 08/06/18   Lawyer, Harrell Gave, PA-C  furosemide (LASIX) 40 MG tablet Take 1 tablet (40 mg total) by mouth 2 (two) times daily. 09/10/18   Tegeler, Gwenyth Allegra, MD  lisinopril (PRINIVIL,ZESTRIL) 10 MG tablet Take 1 tablet (10 mg total) by mouth daily. 09/09/17 09/10/18  Collier Salina, MD  Multiple Vitamin (MULTIVITAMIN) tablet  Take 1 tablet by mouth daily.    [provider]  NITROSTAT 0.4 MG SL tablet PLACE 1 TABLET UNDER TONGUE AS NEEDED FOR CHEST PAIN EVERY 5 MINUTES X 3 MAX DOSES.  CALL 911 IF PAIN PERSISTS Patient taking differently: Place 0.4 mg under the tongue every 5 (five) minutes as needed for chest pain.  02/10/18   [name], MD    Family History Family History  Problem Relation Age of Onset  . Cirrhosis Father   . Heart attack Mother   . Heart attack Sister   . Heart failure Sister   . Heart attack Brother     Social History Social History   Tobacco Use  . Smoking status: Current Every Day Smoker    Packs/day: 0.50    Years: 30.00    Pack years: 15.00    Types: Cigarettes  . Smokeless tobacco: Never Used  . Tobacco comment: .5 PK PER DAY  Substance Use Topics  . Alcohol use: No    Comment: Patient denies abuse, states social drinker  . Drug use: Yes    Types: Cocaine    Comment: heroin     Allergies   Patient has no known allergies.   Review of Systems Review of Systems  All other systems reviewed and are negative.    Physical Exam Updated Vital Signs BP (!) 120/92   Pulse 98   Resp 20   SpO2 99%   Physical Exam  Nursing note and vitals reviewed.  52 year old male, here is dyspneic at rest, but is in no acute distress.  He is not using accessory muscles of respiration.  Vital signs are significant for borderline elevated diastolic blood pressure. Oxygen saturation is 99%, which is normal. Head is normocephalic and atraumatic. PERRLA, EOMI. Oropharynx is clear. Neck is nontender and supple without adenopathy or JVD. Back is nontender and there is no CVA tenderness. Lungs are clear without rales, wheezes, or rhonchi. Chest is nontender. Heart has regular rate and rhythm with 2/6 systolic ejection murmur best heard at the cardiac apex. Abdomen is soft, flat, nontender without masses or hepatosplenomegaly and peristalsis is normoactive. Extremities have  no cyanosis or edema, full range of motion is present. Skin is warm and dry without rash. Neurologic: Mental status is normal, cranial nerves are intact, there are no motor or sensory deficits.  ED Treatments / Results  Labs (all labs ordered are listed, but only abnormal results are displayed) Labs Reviewed  BASIC METABOLIC PANEL - Abnormal; Notable for the following components:      Result Value   Glucose, Bld 162 (*)    Creatinine, Ser 1.42 (*)    Calcium 8.5 (*)    GFR calc non Af Amer 55 (*)    All other components within normal limits  CBC - Abnormal; Notable for the following components:   RDW 15.7 (*)    All other components within normal limits  BRAIN NATRIURETIC PEPTIDE - Abnormal; Notable for the following components:   B Natriuretic Peptide 1,660.5 (*)    All other components within normal limits  HEPATIC FUNCTION PANEL - Abnormal; Notable for the following components:   Total Protein 6.4 (*)    Albumin 3.1 (*)    All other components within normal limits  TROPONIN I  LIPASE, BLOOD    EKG EKG Interpretation  Date/Time:  Sunday October 02 2018 01:58:18 EDT Ventricular Rate:  92 PR Interval:  172 QRS Duration: 96 QT Interval:  392 QTC Calculation: 484 R Axis:   -88 Text Interpretation:  Sinus rhythm with occasional Premature ventricular complexes Biatrial enlargement Left axis deviation Anterolateral infarct , age undetermined Abnormal ECG When compared with ECG of 09/10/2018, No significant change was found Confirmed by Delora Fuel ([id number]) on 10/02/2018 4:19:52 AM   Radiology Dg Chest 2 View  Result Date: 10/02/2018 CLINICAL DATA:  Shortness of breath. EXAM: CHEST - 2 VIEW COMPARISON:  September 10, 2018 FINDINGS: Stable cardiomegaly. No pneumothorax. Scarring in the right upper lung is stable. Increasing interstitial opacities suggest edema. I suspect small effusions with underlying atelectasis. IMPRESSION: Cardiomegaly with new mild edema. Small bilateral  effusions with underlying opacity, likely atelectasis. Electronically Signed   By: Dorise Bullion III M.D   On: 10/02/2018 02:40    Procedures Procedures  Medications Ordered in ED Medications  aspirin chewable tablet 324 mg (has no administration in time range)  furosemide (LASIX) injection 40 mg (has no administration in time range)     Initial Impression / Assessment and Plan / ED Course  I have reviewed the triage vital signs and the nursing notes.  Pertinent labs & imaging results that were available during my care of the patient were reviewed by me and considered in my medical decision making (see chart for details).  Acute dyspnea which is likely CHF secondary to running out of furosemide.  Chest x-ray is consistent with pulmonary edema.  Old records are reviewed showing numerous ED visits for CHF, also several hospitalizations for CHF.  ECG is unchanged from previous ECG.  Screening labs are ordered and he will be given a dose of furosemide intravenously.  He had excellent diuresis with above-noted treatment.  However, he continues to be dyspneic at rest.  He is maintaining adequate oxygen saturation, but he does not feel that he has responded today as he usually does.  Case is discussed with Dr. Heber Coolidge of internal medicine teaching service, who agrees to admit the patient.  Final Clinical Impressions(s) / ED Diagnoses   Final diagnoses:  Acute on chronic combined systolic (congestive) and diastolic (congestive) heart failure [facility])    ED Discharge Orders    None       Delora Fuel, MD 67/67/20 [phone]

## 2018-10-02 NOTE — ED Notes (Signed)
Pt requested water ok per [employer]. Pt given the same.

## 2018-10-02 NOTE — ED Triage Notes (Signed)
Pt c/o SOB since yesterday with epigastric discomfort. Hx of COPD, MI with stent placement. Denies CP at present

## 2018-10-02 NOTE — ED Notes (Signed)
Breakfast tray ordered; heart healthy, 1200 fluid restriction

## 2018-10-02 NOTE — Progress Notes (Signed)
MD on call notified patient in the unit at 1630. Marcille Blanco, RN

## 2018-10-02 NOTE — ED Notes (Signed)
Pt reports being out of his lasix prescription. Pt also reports feeling SOB and having abd pains. Pt states this all started 4 days ago.

## 2018-10-02 NOTE — H&P (Signed)
Date: 10/02/2018               Patient Name:  Tyler Wong MRN: [id number]  DOB: 09/13/1966 Age / Sex: 52 y.o., male   PCP: Patient, No Pcp Per         Medical Service: Internal Medicine Teaching Service         Attending Physician: Dr. Beryle Beams, Alyson Locket, MD    First Contact: Dr. Laural Golden, Areeg Pager: [phone]  Second Contact: Dr. Kalman Shan Pager: [phone]       After Hours (After 5p/  First Contact Pager: [phone]  weekends / holidays): Second Contact Pager: [phone]   Chief Complaint: SOB  History of Present Illness: Tyler Wong is a 52 yo male with a PMHx of CAD, past MI, combined systolic and diastolic CHF, HLD, HTN, cocaine and tobacco use and COPD presenting with a 3 day history of SOB, lower extremity swelling, and abdominal pain. He reported he ran out of his lasix 3 days ago and started notice difficulty breathing especially when he laid down. He said he felt a pressure on the right side of his abdomen and left flank. He said he has had this issue in the past where he runs out of his lasix and presents to the ED because of SOB and his volume status. He said he normally feels better after being diuresed but today still feels swollen and SOB after urinating ~1L. Denied any trouble urinitating. He felt nauseated but denies vomiting or diarrhea. He felt constipated the past few days but had two bowel movements today. He said he felt more comfortable standing up as that relieved the abdominal pressure he was feeling. He said he has been taking all of his medications but missed a few doses of his carvedilol as well. He has refills on his heart medications and has an appointment Thursday with a new PCP.  In the ED, he was found to be tachypneic, normotensive and afebrile. BNP elevated at 1660.5, which appears near his baseline. CXR showed cardiomegaly with new mild edema and small bilateral effusions with underlying opacity, likely atelectasis.    Meds:  Current Meds    Medication Sig  . albuterol (PROVENTIL HFA;VENTOLIN HFA) 108 (90 Base) MCG/ACT inhaler Inhale 2 puffs into the lungs every 4 (four) hours as needed for wheezing or shortness of breath.  Marland Kitchen aspirin 81 MG EC tablet Take 1 tablet (81 mg total) by mouth daily.  Marland Kitchen atorvastatin (LIPITOR) 80 MG tablet Take 1 tablet (80 mg total) by mouth daily.  . carvedilol (COREG) 3.125 MG tablet Take 1 tablet (3.125 mg total) by mouth 2 (two) times daily with a meal.  . furosemide (LASIX) 40 MG tablet Take 1 tablet (40 mg total) by mouth 2 (two) times daily.  Marland Kitchen lisinopril (PRINIVIL,ZESTRIL) 10 MG tablet Take 1 tablet (10 mg total) by mouth daily.  . Multiple Vitamin (MULTIVITAMIN) tablet Take 1 tablet by mouth daily.  Marland Kitchen NITROSTAT 0.4 MG SL tablet PLACE 1 TABLET UNDER TONGUE AS NEEDED FOR CHEST PAIN EVERY 5 MINUTES X 3 MAX DOSES.  CALL 911 IF PAIN PERSISTS (Patient taking differently: Place 0.4 mg under the tongue every 5 (five) minutes as needed for chest pain. )     Allergies: Allergies as of 10/02/2018  . (No Known Allergies)   Past Medical History:  Diagnosis Date  . CAD in native artery 07/17/14   STEMI- LAD stenosis with DES  . CHF (congestive heart failure) ([place])   .  Cocaine abuse ([place])   . COPD (chronic obstructive pulmonary disease) ([place])   . Dyspnea   . Hypertension   . ST elevation myocardial infarction (STEMI) involving left anterior descending (LAD) coronary artery with complication ([place])   . Tobacco abuse 07/17/2014    Family History:  Family History  Problem Relation Age of Onset  . Cirrhosis Father   . Heart attack Mother   . Heart attack Sister   . Heart failure Sister   . Heart attack Brother     Social History:  Social History   Socioeconomic History  . Marital status: Single    Spouse name: Not on file  . Number of children: Not on file  . Years of education: Not on file  . Highest education level: Not on file  Occupational History  . Occupation: Landscaping  Social  Needs  . Financial resource strain: Not on file  . Food insecurity:    Worry: Not on file    Inability: Not on file  . Transportation needs:    Medical: Not on file    Non-medical: Not on file  Tobacco Use  . Smoking status: Current Every Day Smoker    Packs/day: 0.50    Years: 30.00    Pack years: 15.00    Types: Cigarettes  . Smokeless tobacco: Never Used  . Tobacco comment: .5 PK PER DAY  Substance and Sexual Activity  . Alcohol use: No    Comment: Patient denies abuse, states social drinker  . Drug use: Yes    Types: Cocaine    Comment: heroin  . Sexual activity: Yes  Lifestyle  . Physical activity:    Days per week: Not on file    Minutes per session: Not on file  . Stress: Not on file  Relationships  . Social connections:    Talks on phone: Not on file    Gets together: Not on file    Attends religious service: Not on file    Active member of club or organization: Not on file    Attends meetings of clubs or organizations: Not on file    Relationship status: Not on file  . Intimate partner violence:    Fear of current or ex partner: Not on file    Emotionally abused: Not on file    Physically abused: Not on file    Forced sexual activity: Not on file  Other Topics Concern  . Not on file  Social History Narrative   Lives in [place] with his sister and girlfriend. He works Aeronautical engineer work.     Review of Systems: A complete ROS was negative except as per HPI.   Physical Exam: Blood pressure (!) 117/95, pulse 85, resp. rate (!) 30, SpO2 99 %.  Physical exam: General- seen standing next to the bedside, in NAD, at times SOB during interview Heart- pericardial friction rub appreciated Lungs- CTA bilaterally, normal effort Abdomen- no distension, RUQ and RLQ tenderness, bowel sounds present Extremities- no edema  EKG: personally reviewed my interpretation is sinus rhythm, atrial enlargement, abnormal when compared to ECG from 09/10/18  CXR:  personally reviewed my interpretation is cardiomegaly with mild edema  Assessment & Plan by Problem: Active Problems:   Acute on chronic systolic (congestive) heart failure [facility])  Mr. Guay is a 52 yo male with a PMHx of CAD, past MI, combined systolic and diastolic CHF, cocaine use, HLD, HTN, and COPD presenting with a 3 day history of SOB, orthopnea, lower extremity  swelling, and abdominal pain after his ran out of his lasix.  Acute on chronic systolic heart failure - last furosemide dose was 3 days ago; euvolemic on exam after 1 dose of 40 mg IV furosemide  - urine output ~1.2 L  - on exam pericardial friction rub appreciated; f/u echo - last echo 11/05/2017 showed LV EF 28-36%, grade 2 diastolic dysfunction, biatrial enlargement, diffuse hypokinesis and mild pulmonary hypertension   CAD Prior MI - Total proximal to mid LAD occlusion, 07/17/14; underwent successful DES stenting of his LAD  - hold atorvastatin 80 mg, carvedilol 3.125 mg bid, lisinopril 10 mg   HLD - hold atorvastatin 80 mg qd   HTN  - normotensive on admission   - hold lisinopril 10 mg qd   COPD  - continue albuterol q4h prn - started on 2L [place]   Diet: Heart Healthy, strict fluid ins and outs  DVT prophylaxis: Lovenox  Full Code  Dispo: Admit patient to Inpatient with expected length of stay greater than 2 midnights.  SignedMike Craze, DO 10/02/2018, 9:56 AM  Pager: [phone]

## 2018-10-02 NOTE — Progress Notes (Signed)
  Echocardiogram 2D Echocardiogram has been performed.  Tyler Wong 10/02/2018, 10:17 AM

## 2018-10-03 ENCOUNTER — Encounter (HOSPITAL_COMMUNITY): Payer: Self-pay | Admitting: Cardiology

## 2018-10-03 ENCOUNTER — Telehealth (HOSPITAL_COMMUNITY): Payer: Self-pay | Admitting: Surgery

## 2018-10-03 ENCOUNTER — Encounter (HOSPITAL_COMMUNITY)
Admission: EM | Disposition: A | Discharge status: Home / Self Care | Payer: Self-pay | Source: Home / Self Care | Attending: Oncology

## 2018-10-03 DIAGNOSIS — I5023 Acute on chronic systolic (congestive) heart failure: Secondary | ICD-10-CM

## 2018-10-03 DIAGNOSIS — I2510 Atherosclerotic heart disease of native coronary artery without angina pectoris: Secondary | ICD-10-CM

## 2018-10-03 DIAGNOSIS — I509 Heart failure, unspecified: Secondary | ICD-10-CM

## 2018-10-03 HISTORY — PX: RIGHT/LEFT HEART CATH AND CORONARY ANGIOGRAPHY: CATH118266

## 2018-10-03 LAB — BASIC METABOLIC PANEL
Anion gap: 9 (ref 5–15)
BUN: 17 mg/dL (ref 6–20)
CO2: 24 mmol/L (ref 22–32)
Calcium: 8.2 mg/dL — ABNORMAL LOW (ref 8.9–10.3)
Chloride: 105 mmol/L (ref 98–111)
Creatinine, Ser: 1.29 mg/dL — ABNORMAL HIGH (ref 0.61–1.24)
GFR calc Af Amer: >60 mL/min (ref >60)
GFR calc non Af Amer: >60 mL/min (ref >60)
Glucose, Bld: 110 mg/dL — ABNORMAL HIGH (ref 70–99)
Potassium: 4.3 mmol/L (ref 3.5–5.1)
Sodium: 138 mmol/L (ref 135–145)

## 2018-10-03 LAB — POCT I-STAT 3, VENOUS BLOOD GAS (G3P V)
Acid-Base Excess: 4 mmol/L — ABNORMAL HIGH (ref 0.0–2.0)
Acid-Base Excess: 5 mmol/L — ABNORMAL HIGH (ref 0.0–2.0)
Bicarbonate: 29.7 mmol/L — ABNORMAL HIGH (ref 20.0–28.0)
Bicarbonate: 30.1 mmol/L — ABNORMAL HIGH (ref 20.0–28.0)
O2 Saturation: 63 %
O2 Saturation: 67 %
TCO2: 31 mmol/L (ref 22–32)
TCO2: 31 mmol/L (ref 22–32)
pCO2, Ven: 45 mmHg (ref 44.0–60.0)
pCO2, Ven: 45.1 mmHg (ref 44.0–60.0)
pH, Ven: 7.426 (ref 7.250–7.430)
pH, Ven: 7.433 — ABNORMAL HIGH (ref 7.250–7.430)
pO2, Ven: 32 mmHg (ref 32.0–45.0)
pO2, Ven: 34 mmHg (ref 32.0–45.0)

## 2018-10-03 LAB — CBC
HCT: 41.9 % (ref 39.0–52.0)
Hemoglobin: 13.5 g/dL (ref 13.0–17.0)
MCH: 28.5 pg (ref 26.0–34.0)
MCHC: 32.2 g/dL (ref 30.0–36.0)
MCV: 88.4 fL (ref 78.0–100.0)
Platelets: 200 10*3/uL (ref 150–400)
RBC: 4.74 MIL/uL (ref 4.22–5.81)
RDW: 15.7 % — ABNORMAL HIGH (ref 11.5–15.5)
WBC: 8.2 10*3/uL (ref 4.0–10.5)

## 2018-10-03 SURGERY — RIGHT/LEFT HEART CATH AND CORONARY ANGIOGRAPHY
Anesthesia: LOCAL

## 2018-10-03 MED ORDER — SACUBITRIL-VALSARTAN 24-26 MG PO TABS
1.0000 | ORAL_TABLET | Freq: Two times a day (BID) | ORAL | Status: DC
  Administered 2018-10-03 – 2018-10-04 (×3): 1 via ORAL
  Filled 2018-10-03 (×3): qty 1

## 2018-10-03 MED ORDER — FENTANYL CITRATE (PF) 100 MCG/2ML IJ SOLN
INTRAMUSCULAR | Status: DC | PRN
  Administered 2018-10-03 (×2): 25 ug via INTRAVENOUS

## 2018-10-03 MED ORDER — ASPIRIN 81 MG PO CHEW: 81.0000 mg | CHEWABLE_TABLET | ORAL | Status: DC

## 2018-10-03 MED ORDER — LIDOCAINE HCL (PF) 1 % IJ SOLN
INTRAMUSCULAR | Status: DC | PRN
  Administered 2018-10-03: 13 mL via SUBCUTANEOUS
  Administered 2018-10-03: 1 mL via SUBCUTANEOUS

## 2018-10-03 MED ORDER — SODIUM CHLORIDE 0.9% FLUSH
3.0000 mL | Freq: Two times a day (BID) | INTRAVENOUS | Status: DC
  Administered 2018-10-03: 3 mL via INTRAVENOUS

## 2018-10-03 MED ORDER — MIDAZOLAM HCL 2 MG/2ML IJ SOLN
INTRAMUSCULAR | Status: DC | PRN
  Administered 2018-10-03: 1 mg via INTRAVENOUS

## 2018-10-03 MED ORDER — ASPIRIN EC 81 MG PO TBEC
81.0000 mg | DELAYED_RELEASE_TABLET | Freq: Every day | ORAL | Status: DC
  Administered 2018-10-03 – 2018-10-04 (×2): 81 mg via ORAL
  Filled 2018-10-03 (×2): qty 1

## 2018-10-03 MED ORDER — CARVEDILOL 3.125 MG PO TABS
3.1250 mg | ORAL_TABLET | Freq: Two times a day (BID) | ORAL | Status: DC
  Administered 2018-10-03 – 2018-10-04 (×2): 3.125 mg via ORAL
  Filled 2018-10-03 (×2): qty 1

## 2018-10-03 MED ORDER — MIDAZOLAM HCL 2 MG/2ML IJ SOLN
INTRAMUSCULAR | Status: AC
  Filled 2018-10-03: qty 2

## 2018-10-03 MED ORDER — HEPARIN SODIUM (PORCINE) 1000 UNIT/ML IJ SOLN
INTRAMUSCULAR | Status: AC
  Filled 2018-10-03: qty 1

## 2018-10-03 MED ORDER — ATORVASTATIN CALCIUM 40 MG PO TABS: 40.0000 mg | ORAL_TABLET | Freq: Every day | ORAL | Status: DC

## 2018-10-03 MED ORDER — ATORVASTATIN CALCIUM 80 MG PO TABS
80.0000 mg | ORAL_TABLET | Freq: Every day | ORAL | Status: DC
  Administered 2018-10-03: 80 mg via ORAL
  Filled 2018-10-03: qty 1

## 2018-10-03 MED ORDER — LIDOCAINE HCL (PF) 1 % IJ SOLN
INTRAMUSCULAR | Status: AC
  Filled 2018-10-03: qty 30

## 2018-10-03 MED ORDER — FUROSEMIDE 10 MG/ML IJ SOLN
40.0000 mg | Freq: Two times a day (BID) | INTRAMUSCULAR | Status: DC
  Administered 2018-10-03 (×2): 40 mg via INTRAVENOUS
  Filled 2018-10-03 (×3): qty 4

## 2018-10-03 MED ORDER — VERAPAMIL HCL 2.5 MG/ML IV SOLN
INTRAVENOUS | Status: AC
  Filled 2018-10-03: qty 2

## 2018-10-03 MED ORDER — SODIUM CHLORIDE 0.9% FLUSH: 3.0000 mL | Freq: Two times a day (BID) | INTRAVENOUS | Status: DC

## 2018-10-03 MED ORDER — SODIUM CHLORIDE 0.9 % IV SOLN
INTRAVENOUS | Status: DC
  Administered 2018-10-03: 11:00:00 via INTRAVENOUS

## 2018-10-03 MED ORDER — ACETAMINOPHEN 325 MG PO TABS: 650.0000 mg | ORAL_TABLET | ORAL | Status: DC | PRN

## 2018-10-03 MED ORDER — HEPARIN (PORCINE) IN NACL 1000-0.9 UT/500ML-% IV SOLN
INTRAVENOUS | Status: AC
  Filled 2018-10-03: qty 1000

## 2018-10-03 MED ORDER — SODIUM CHLORIDE 0.9 % IV SOLN: 250.0000 mL | INTRAVENOUS | Status: DC | PRN

## 2018-10-03 MED ORDER — VERAPAMIL HCL 2.5 MG/ML IV SOLN
INTRAVENOUS | Status: DC | PRN
  Administered 2018-10-03: 10 mL via INTRA_ARTERIAL

## 2018-10-03 MED ORDER — IOHEXOL 350 MG/ML SOLN
INTRAVENOUS | Status: DC | PRN
  Administered 2018-10-03: 70 mL via INTRA_ARTERIAL

## 2018-10-03 MED ORDER — HEPARIN (PORCINE) IN NACL 1000-0.9 UT/500ML-% IV SOLN
INTRAVENOUS | Status: DC | PRN
  Administered 2018-10-03 (×2): 500 mL

## 2018-10-03 MED ORDER — FENTANYL CITRATE (PF) 100 MCG/2ML IJ SOLN
INTRAMUSCULAR | Status: AC
  Filled 2018-10-03: qty 2

## 2018-10-03 MED ORDER — SODIUM CHLORIDE 0.9% FLUSH: 3.0000 mL | INTRAVENOUS | Status: DC | PRN

## 2018-10-03 MED ORDER — ENOXAPARIN SODIUM 40 MG/0.4ML ~~LOC~~ SOLN
40.0000 mg | SUBCUTANEOUS | Status: DC
  Filled 2018-10-03: qty 0.4

## 2018-10-03 MED ORDER — ONDANSETRON HCL 4 MG/2ML IJ SOLN: 4.0000 mg | Freq: Four times a day (QID) | INTRAMUSCULAR | Status: DC | PRN

## 2018-10-03 SURGICAL SUPPLY — 11 items

## 2018-10-03 NOTE — Consult Note (Addendum)
Advanced Heart Failure Team Consult Note   Primary Physician: Tyler Wong, No Pcp Per PCP-Cardiologist:Dr Claiborne Billings   Reason for Consultation: Heart Failure   HPI:    Tyler Wong is seen today for evaluation of heart faiure at the request of Dr Beryle Beams.   Tyler Wong is a 52 year old with history of chronic systolic heart failure, CAD MI 2015 with DES to LAD , HTN, hyperlipidemia, and cocaine abuse. He has been using cocaine for 30 years. Typically uses cocaine once a month. Drinks 1 beer a week. Smokes 1 PPD every 3 days. Runs out medications every month because he cant get refills..   Evaluated in the ED 9/14 and 08/06/2018 with shortness of breath and chest pain. Diuresed with IV lasix and was sent home the same day.    Prior to to admit he was out lasix for 3 days. Says he only takes lipitor a few days a week. Mother and sister and had heart failure. Both are deceased.   Admitted 10/22/2018 with increased shortness of breath and chest pain. CXR with mild edema and small pleural effusions.Diuresing with IV lasix. Pertinent admission labs included BNP 1660, troponin < 0.03, Creatine 1.42, and Hgb 13.6.   Feeling a little better.   Echo Oct 22, 2018  RV moderately dilated. RA severly dilated.  . LV EF20-25% severely reduced with ventricular dilation. Wall motion   appears diffusely worse at apex, involving dyskinesis of inferior   apex and severe hypokinesis/akinesis of the anterior,   anterolateral, inferolateral, and inferoseptal apex.    2. At least moderate Tyler due to functional noncoaptation of the   leaflets.    3. Moderate-severe TR with elevated right sided pressures.    4. Trivial pericardial effusion.  [place] 2015  DES to LAD 2015     Review of Systems: [y] = yes, [ ]  = no   General: Weight gain [ ] ; Weight loss [ ] ; Anorexia [ ] ; Fatigue [ ] ; Fever [ ] ; Chills [ ] ; Weakness [Y ]  Cardiac: Chest pain/pressure [ ] ; Resting SOB [ ] ; Exertional SOB [ ] ; Orthopnea [ ] ; Pedal Edema  [ ] ; Palpitations [ ] ; Syncope [ ] ; Presyncope [ ] ; Paroxysmal nocturnal dyspnea[ ]   Pulmonary: Cough [ ] ; Wheezing[ ] ; Hemoptysis[ ] ; Sputum [ ] ; Snoring [ ]   GI: Vomiting[ ] ; Dysphagia[ ] ; Melena[ ] ; Hematochezia [ ] ; Heartburn[ ] ; Abdominal pain [ ] ; Constipation [ ] ; Diarrhea [ ] ; BRBPR [ ]   GU: Hematuria[ ] ; Dysuria [ ] ; Nocturia[ ]   Vascular: Pain in legs with walking [ ] ; Pain in feet with lying flat [ ] ; Non-healing sores [ ] ; Stroke [ ] ; TIA [ ] ; Slurred speech [ ] ;  Neuro: Headaches[ ] ; Vertigo[ ] ; Seizures[ ] ; Paresthesias[ ] ;Blurred vision [ ] ; Diplopia [ ] ; Vision changes [ ]   Ortho/Skin: Arthritis [ ] ; Joint pain [Y ]; Muscle pain [ ] ; Joint swelling [ ] ; Back Pain [Y ]; Rash [ ]   Psych: Depression[ ] ; Anxiety[ ]   Heme: Bleeding problems [ ] ; Clotting disorders [ ] ; Anemia [ ]   Endocrine: Diabetes [ ] ; Thyroid dysfunction[ ]   Home Medications Prior to Admission medications   Medication Sig Start Date End Date Taking? Authorizing Provider  albuterol (PROVENTIL HFA;VENTOLIN HFA) 108 (90 Base) MCG/ACT inhaler Inhale 2 puffs into the lungs every 4 (four) hours as needed for wheezing or shortness of breath. 09/09/17  Yes Rice, Resa Miner, MD  aspirin 81 MG EC tablet Take 1 tablet (81 mg total) by  mouth daily. 09/09/17  Yes Rice, Resa Miner, MD  atorvastatin (LIPITOR) 80 MG tablet Take 1 tablet (80 mg total) by mouth daily. 09/09/17  Yes Rice, Resa Miner, MD  carvedilol (COREG) 3.125 MG tablet Take 1 tablet (3.125 mg total) by mouth 2 (two) times daily with a meal. 12/15/17 10/02/26 Yes Rice, Resa Miner, MD  furosemide (LASIX) 40 MG tablet Take 1 tablet (40 mg total) by mouth 2 (two) times daily. 08/06/18  Yes Lawyer, Harrell Gave, PA-C  lisinopril (PRINIVIL,ZESTRIL) 10 MG tablet Take 1 tablet (10 mg total) by mouth daily. 09/09/17 10/02/26 Yes Rice, Resa Miner, MD  Multiple Vitamin (MULTIVITAMIN) tablet Take 1 tablet by mouth daily.   Yes [provider]  NITROSTAT 0.4  MG SL tablet PLACE 1 TABLET UNDER TONGUE AS NEEDED FOR CHEST PAIN EVERY 5 MINUTES X 3 MAX DOSES.  CALL 911 IF PAIN PERSISTS Tyler Wong taking differently: Place 0.4 mg under the tongue every 5 (five) minutes as needed for chest pain.  02/10/18  Yes [name], MD  eplerenone (INSPRA) 25 MG tablet Take 1 tablet (25 mg total) by mouth daily. Stop spironolactone Tyler Wong not taking: Reported on 08/06/2018 12/15/17   Collier Salina, MD  furosemide (LASIX) 40 MG tablet Take 1 tablet (40 mg total) by mouth daily. Tyler Wong not taking: Reported on 10/02/2018 12/15/17 10/02/24  Collier Salina, MD  furosemide (LASIX) 40 MG tablet Take 1 tablet (40 mg total) by mouth 2 (two) times daily. Tyler Wong not taking: Reported on 10/02/2018 09/10/18   Tegeler, Gwenyth Allegra, MD    Past Medical History: Past Medical History:  Diagnosis Date  . CAD in native artery 07/17/14   STEMI- LAD stenosis with DES  . CHF (congestive heart failure) ([place])   . Cocaine abuse ([place])   . COPD (chronic obstructive pulmonary disease) ([place])   . Dyspnea   . Hypertension   . ST elevation myocardial infarction (STEMI) involving left anterior descending (LAD) coronary artery with complication ([place])   . Tobacco abuse 07/17/2014    Past Surgical History: Past Surgical History:  Procedure Laterality Date  . CORONARY ANGIOPLASTY WITH STENT PLACEMENT  07/18/14   resolute DES to LAD STEMI  . LEFT HEART CATH N/A 07/17/2014   Procedure: LEFT HEART CATH;  Surgeon: [name], MD;  Location: [facility] CATH LAB;  Service: Cardiovascular;  Laterality: N/A;  . PERCUTANEOUS CORONARY STENT INTERVENTION (PCI-S)  07/17/2014   Procedure: PERCUTANEOUS CORONARY STENT INTERVENTION (PCI-S);  Surgeon: [name], MD;  Location: [facility] CATH LAB;  Service: Cardiovascular;;    Family History: Family History  Problem Relation Age of Onset  . Cirrhosis Father   . Heart attack Mother   . Heart attack Sister   . Heart failure Sister   . Heart attack  Brother     Social History: Social History   Socioeconomic History  . Marital status: Single    Spouse name: Not on file  . Number of children: Not on file  . Years of education: Not on file  . Highest education level: Not on file  Occupational History  . Occupation: Landscaping  Social Needs  . Financial resource strain: Not on file  . Food insecurity:    Worry: Not on file    Inability: Not on file  . Transportation needs:    Medical: Not on file    Non-medical: Not on file  Tobacco Use  . Smoking status: Current Every Day Smoker    Packs/day: 0.50  Years: 30.00    Pack years: 15.00    Types: Cigarettes  . Smokeless tobacco: Never Used  . Tobacco comment: .5 PK PER DAY  Substance and Sexual Activity  . Alcohol use: No    Comment: Tyler Wong denies abuse, states social drinker  . Drug use: Yes    Types: Cocaine    Comment: heroin  . Sexual activity: Yes  Lifestyle  . Physical activity:    Days per week: Not on file    Minutes per session: Not on file  . Stress: Not on file  Relationships  . Social connections:    Talks on phone: Not on file    Gets together: Not on file    Attends religious service: Not on file    Active member of club or organization: Not on file    Attends meetings of clubs or organizations: Not on file    Relationship status: Not on file  Other Topics Concern  . Not on file  Social History Narrative   Lives in [place] with his sister and girlfriend. He works Aeronautical engineer work.     Allergies:  No Known Allergies  Objective:    Vital Signs:   Temp:  [97.6 F (36.4 C)-99 F (37.2 C)] 98.6 F (37 C) (10/07 0433) Pulse Rate:  [80-101] 80 (10/07 0433) Resp:  [18-24] 18 (10/07 0433) BP: (107-126)/(73-100) 123/90 (10/07 0433) SpO2:  [91 %-100 %] 97 % (10/07 0433) Weight:  [76.8 kg] 76.8 kg (10/07 0433) Last BM Date: 10/02/18  Weight change: Filed Weights   10/02/18 1447 10/03/18 0433  Weight: 76.8 kg 76.8 kg     Intake/Output:   Intake/Output Summary (Last 24 hours) at 10/03/2018 0830 Last data filed at 10/02/2018 2200 Gross per 24 hour  Intake 345 ml  Output 3100 ml  Net -2755 ml      Physical Exam    General: In bed  No resp difficulty HEENT: normal Neck: supple. JVP 10. Carotids 2+ bilat; no bruits. No lymphadenopathy or thyromegaly appreciated. Cor: PMI nondisplaced. Regular rate & rhythm. No rubs, gallops.  2/6 HSM LLSB/apex.  Lungs: clear Abdomen: soft, nontender, nondistended. No hepatosplenomegaly. No bruits or masses. Good bowel sounds. Extremities: no cyanosis, clubbing, rash, edema Neuro: alert & orientedx3, cranial nerves grossly intact. moves all 4 extremities w/o difficulty. Affect pleasant   Telemetry   NSR 90s personally reviewed.   EKG   SR 92 bpm with occasional PVCs.   Labs   Basic Metabolic Panel: Recent Labs  Lab 10/02/18 0210 10/02/18 0459 10/02/18 1459 10/03/18 0530  NA 140  --  138 138  K 4.1  --  3.8 4.3  CL 109  --  103 105  CO2 22  --  28 24  GLUCOSE 162*  --  85 110*  BUN 20  --  17 17  CREATININE 1.42*  --  1.41* 1.29*  CALCIUM 8.5*  --  8.4* 8.2*  MG  --  2.0  --   --   PHOS  --   --  2.7  --     Liver Function Tests: Recent Labs  Lab 10/02/18 0459 10/02/18 1459  AST 24  --   ALT 38  --   ALKPHOS 80  --   BILITOT 0.7  --   PROT 6.4*  --   ALBUMIN 3.1* 3.0*   Recent Labs  Lab 10/02/18 0459  LIPASE 39   No results for input(s): AMMONIA in the last 168 hours.  CBC: Recent Labs  Lab 10/02/18 0210 10/03/18 0530  WBC 7.0 8.2  HGB 13.6 13.5  HCT 41.9 41.9  MCV 89.7 88.4  PLT 207 200    Cardiac Enzymes: Recent Labs  Lab 10/02/18 0459  TROPONINI <0.03    BNP: BNP (last 3 results) Recent Labs    08/06/18 0819 09/10/18 0731 10/02/18 0459  BNP 1,277.7* 1,637.1* 1,660.5*    ProBNP (last 3 results) No results for input(s): PROBNP in the last 8760 hours.   CBG: No results for input(s): GLUCAP in the  last 168 hours.  Coagulation Studies: No results for input(s): LABPROT, INR in the last 72 hours.   Imaging   No results found.   Medications:     Current Medications: . enoxaparin (LOVENOX) injection  40 mg Subcutaneous Q24H    Infusions:     Tyler Wong Profile   Tyler Cassaday is a 52 year old with history of chronic systolic heart failure, CAD MI 2015 with DES to LAD , HTN, hyperlipidemia, and cocaine abuse. He has been using cocaine for 30 years. Typically uses cocaine once a month. Drinks 1 beer a week. Smokes 1 PPD every 3 days. Runs out medications every month because he cant get refills..    Assessment/Plan   1. A/C Systolic Heart Failure In 2015 EF 40-45% and now down to 20-25%. Likely ICM but also could be familial with sister and mom having heart failure  Volume status improved with IV lasix.  Continue IV lasix  40 mg IV twice a day today. Start entresto 24-26 mg twice a day. He has not had lisinopril in 4 days.  Add 3.125 mg carvedilol twice a day.  RHC/LHC today.   2. CAD  Had [place] 2015 with DES to LAD Restart lipitor and 81 mg aspirin  3. Cocaine Abuse  4. Tobacco Abuse Discussed cessation.   5. Social  Limited income. Has hard time pay for medications and transportation.   Refer to Paramedicine.  Medication concerns reviewed with Tyler Wong and pharmacy team. Barriers identified: Yes money.   Length of Stay: 1  Amy Clegg, NP  10/03/2018, 8:30 AM  Advanced Heart Failure Team Pager [phone] (M-F; [place])  Please contact [facility] for night-coverage after hours (4p -7a ) and weekends on [email or web address]  Tyler Wong seen with NP, agree with the above note.   Tyler Wong has history of CAD and ischemic cardiomyopathy, had LAD PCI in 2015.  He continues to use cocaine occasionally and to smoke cigarettes.  He had been on cardiac meds but had been out for "several days" prior to admission.  He came to the hospital with progressive dyspnea, no chest pain.  He has  diuresed well so far with IV Lasix.  ECG with NSR, old anterior MI.   On exam, JVP 10 cm, 2/6 HSM LLSB/apex, clear lungs, no edema.   1. Acute on chronic systolic CHF: Echo in 5176 with EF 40-45%, thought to be ischemic cardiomyopathy.  Now with EF down to 20-25% and RV dysfunction.  Worsening coronary disease is certainly a possibility for fall in EF though he denies chest pain.  Additionally, he has a strong family history of CHF/cardiomyopathy, so may be a component of familial cardiomyopathy.  Cocaine additionally could play a role in fall in EF.  On exam, he is still mildly volume overloaded and he says that he still feels short of breath.  - He will need left/right heart catheterization, especially to look for progressive CAD as cause  in fall in EF.  Will arrange to do this afternoon, will keep NPO.  I discussed risks/benefits with Tyler Wong and he agrees to proceed.  - Continue Lasix 40 mg IV bid today, diuresed well overnight.  - Can restart Coreg 3.125 mg bid and will add Entresto 24/26 bid.  - If BP stable tomorrow, will add back eplerenone.  2. CAD: H/o anterior STEMI with DES to LAD in 7/15. No chest pain recently but concern that fall in EF may be related to progression of CAD.  - Cath today as above.  - Restart ASA 81 and atorvastatin 80 mg daily.  3. Cocaine abuse: Counseled to quit.  4. Active smoker: Counseled to quit.  5. Social: He has no insurance.  Will involve social work, will try to get him in paramedicine program.  Loralie Champagne 10/03/2018 8:51 AM

## 2018-10-03 NOTE — Progress Notes (Signed)
Patient has continued to be non compliant with bedrest since returning from cath lab.   TR band level 0. Pt offering no c/o.

## 2018-10-03 NOTE — Progress Notes (Signed)
Patient returned from cath lab awake and alert.  V/S are being obtained in sequence per protocol. Orders released, including diet.  Right radial level 0. Right brachial level 0. Will continue to monitor.

## 2018-10-03 NOTE — Progress Notes (Signed)
Pt noted to be dressed in street clothes, walking thru hospital in search of food. Pt was seen by [facility] staff member.  This scriber was giving direct patient care in a room 3 doors down and not aware that pt had left his room /unit.  Pt encouraged to return to unit and he did so. Patient stated he was hungry and not going to wait. Patient had been told previously that a sandwich and fruit could be provided for him but he didn't wait for it to be brought to his room.  By leaving his room unattended, the patient violated the 4 hour bedrest order, Left the unit with a TR band to right radial, removed his tele box therefore was not being monitored. And also interrupted the sequential vital signs that were being obtained.  Internal Medicine and Cardiology notified of this event.  Advised patient to remain patient with staff and all needs would be addressed as timely as possible. Explained to patient the risk of bleeding. Pt stated" yea, well that didn't happen".

## 2018-10-03 NOTE — H&P (View-Only) (Signed)
Advanced Heart Failure Team Consult Note   Primary Physician: Patient, No Pcp Per PCP-Cardiologist:Dr Claiborne Billings   Reason for Consultation: Heart Failure   HPI:    Tyler Wong is seen today for evaluation of heart faiure at the request of Dr Beryle Beams.   Tyler Wong is a 52 year old with history of chronic systolic heart failure, CAD MI 2015 with DES to LAD , HTN, hyperlipidemia, and cocaine abuse. He has been using cocaine for 30 years. Typically uses cocaine once a month. Drinks 1 beer a week. Smokes 1 PPD every 3 days. Runs out medications every month because he cant get refills..   Evaluated in the ED 9/14 and 08/06/2018 with shortness of breath and chest pain. Diuresed with IV lasix and was sent home the same day.    Prior to to admit he was out lasix for 3 days. Says he only takes lipitor a few days a week. Mother and sister and had heart failure. Both are deceased.   Admitted 10-21-18 with increased shortness of breath and chest pain. CXR with mild edema and small pleural effusions.Diuresing with IV lasix. Pertinent admission labs included BNP 1660, troponin < 0.03, Creatine 1.42, and Hgb 13.6.   Feeling a little better.   Echo 21-Oct-2018  RV moderately dilated. RA severly dilated.  . LV EF20-25% severely reduced with ventricular dilation. Wall motion   appears diffusely worse at apex, involving dyskinesis of inferior   apex and severe hypokinesis/akinesis of the anterior,   anterolateral, inferolateral, and inferoseptal apex.    2. At least moderate Tyler due to functional noncoaptation of the   leaflets.    3. Moderate-severe TR with elevated right sided pressures.    4. Trivial pericardial effusion.  [place] 2015  DES to LAD 2015     Review of Systems: [y] = yes, [ ]  = no   General: Weight gain [ ] ; Weight loss [ ] ; Anorexia [ ] ; Fatigue [ ] ; Fever [ ] ; Chills [ ] ; Weakness [Y ]  Cardiac: Chest pain/pressure [ ] ; Resting SOB [ ] ; Exertional SOB [ ] ; Orthopnea [ ] ; Pedal Edema  [ ] ; Palpitations [ ] ; Syncope [ ] ; Presyncope [ ] ; Paroxysmal nocturnal dyspnea[ ]   Pulmonary: Cough [ ] ; Wheezing[ ] ; Hemoptysis[ ] ; Sputum [ ] ; Snoring [ ]   GI: Vomiting[ ] ; Dysphagia[ ] ; Melena[ ] ; Hematochezia [ ] ; Heartburn[ ] ; Abdominal pain [ ] ; Constipation [ ] ; Diarrhea [ ] ; BRBPR [ ]   GU: Hematuria[ ] ; Dysuria [ ] ; Nocturia[ ]   Vascular: Pain in legs with walking [ ] ; Pain in feet with lying flat [ ] ; Non-healing sores [ ] ; Stroke [ ] ; TIA [ ] ; Slurred speech [ ] ;  Neuro: Headaches[ ] ; Vertigo[ ] ; Seizures[ ] ; Paresthesias[ ] ;Blurred vision [ ] ; Diplopia [ ] ; Vision changes [ ]   Ortho/Skin: Arthritis [ ] ; Joint pain [Y ]; Muscle pain [ ] ; Joint swelling [ ] ; Back Pain [Y ]; Rash [ ]   Psych: Depression[ ] ; Anxiety[ ]   Heme: Bleeding problems [ ] ; Clotting disorders [ ] ; Anemia [ ]   Endocrine: Diabetes [ ] ; Thyroid dysfunction[ ]   Home Medications Prior to Admission medications   Medication Sig Start Date End Date Taking? Authorizing Provider  albuterol (PROVENTIL HFA;VENTOLIN HFA) 108 (90 Base) MCG/ACT inhaler Inhale 2 puffs into the lungs every 4 (four) hours as needed for wheezing or shortness of breath. 09/09/17  Yes Rice, Resa Miner, MD  aspirin 81 MG EC tablet Take 1 tablet (81 mg total) by  mouth daily. 09/09/17  Yes Rice, Resa Miner, MD  atorvastatin (LIPITOR) 80 MG tablet Take 1 tablet (80 mg total) by mouth daily. 09/09/17  Yes Rice, Resa Miner, MD  carvedilol (COREG) 3.125 MG tablet Take 1 tablet (3.125 mg total) by mouth 2 (two) times daily with a meal. 12/15/17 10/02/26 Yes Rice, Resa Miner, MD  furosemide (LASIX) 40 MG tablet Take 1 tablet (40 mg total) by mouth 2 (two) times daily. 08/06/18  Yes Lawyer, Harrell Gave, PA-C  lisinopril (PRINIVIL,ZESTRIL) 10 MG tablet Take 1 tablet (10 mg total) by mouth daily. 09/09/17 10/02/26 Yes Rice, Resa Miner, MD  Multiple Vitamin (MULTIVITAMIN) tablet Take 1 tablet by mouth daily.   Yes [provider]  NITROSTAT 0.4  MG SL tablet PLACE 1 TABLET UNDER TONGUE AS NEEDED FOR CHEST PAIN EVERY 5 MINUTES X 3 MAX DOSES.  CALL 911 IF PAIN PERSISTS Patient taking differently: Place 0.4 mg under the tongue every 5 (five) minutes as needed for chest pain.  02/10/18  Yes [name], MD  eplerenone (INSPRA) 25 MG tablet Take 1 tablet (25 mg total) by mouth daily. Stop spironolactone Patient not taking: Reported on 08/06/2018 12/15/17   Collier Salina, MD  furosemide (LASIX) 40 MG tablet Take 1 tablet (40 mg total) by mouth daily. Patient not taking: Reported on 10/02/2018 12/15/17 10/02/24  Collier Salina, MD  furosemide (LASIX) 40 MG tablet Take 1 tablet (40 mg total) by mouth 2 (two) times daily. Patient not taking: Reported on 10/02/2018 09/10/18   Tegeler, Gwenyth Allegra, MD    Past Medical History: Past Medical History:  Diagnosis Date  . CAD in native artery 07/17/14   STEMI- LAD stenosis with DES  . CHF (congestive heart failure) ([place])   . Cocaine abuse ([place])   . COPD (chronic obstructive pulmonary disease) ([place])   . Dyspnea   . Hypertension   . ST elevation myocardial infarction (STEMI) involving left anterior descending (LAD) coronary artery with complication ([place])   . Tobacco abuse 07/17/2014    Past Surgical History: Past Surgical History:  Procedure Laterality Date  . CORONARY ANGIOPLASTY WITH STENT PLACEMENT  07/18/14   resolute DES to LAD STEMI  . LEFT HEART CATH N/A 07/17/2014   Procedure: LEFT HEART CATH;  Surgeon: [name], MD;  Location: [facility] CATH LAB;  Service: Cardiovascular;  Laterality: N/A;  . PERCUTANEOUS CORONARY STENT INTERVENTION (PCI-S)  07/17/2014   Procedure: PERCUTANEOUS CORONARY STENT INTERVENTION (PCI-S);  Surgeon: [name], MD;  Location: [facility] CATH LAB;  Service: Cardiovascular;;    Family History: Family History  Problem Relation Age of Onset  . Cirrhosis Father   . Heart attack Mother   . Heart attack Sister   . Heart failure Sister   . Heart attack  Brother     Social History: Social History   Socioeconomic History  . Marital status: Single    Spouse name: Not on file  . Number of children: Not on file  . Years of education: Not on file  . Highest education level: Not on file  Occupational History  . Occupation: Landscaping  Social Needs  . Financial resource strain: Not on file  . Food insecurity:    Worry: Not on file    Inability: Not on file  . Transportation needs:    Medical: Not on file    Non-medical: Not on file  Tobacco Use  . Smoking status: Current Every Day Smoker    Packs/day: 0.50  Years: 30.00    Pack years: 15.00    Types: Cigarettes  . Smokeless tobacco: Never Used  . Tobacco comment: .5 PK PER DAY  Substance and Sexual Activity  . Alcohol use: No    Comment: Patient denies abuse, states social drinker  . Drug use: Yes    Types: Cocaine    Comment: heroin  . Sexual activity: Yes  Lifestyle  . Physical activity:    Days per week: Not on file    Minutes per session: Not on file  . Stress: Not on file  Relationships  . Social connections:    Talks on phone: Not on file    Gets together: Not on file    Attends religious service: Not on file    Active member of club or organization: Not on file    Attends meetings of clubs or organizations: Not on file    Relationship status: Not on file  Other Topics Concern  . Not on file  Social History Narrative   Lives in [place] with his sister and girlfriend. He works Aeronautical engineer work.     Allergies:  No Known Allergies  Objective:    Vital Signs:   Temp:  [97.6 F (36.4 C)-99 F (37.2 C)] 98.6 F (37 C) (10/07 0433) Pulse Rate:  [80-101] 80 (10/07 0433) Resp:  [18-24] 18 (10/07 0433) BP: (107-126)/(73-100) 123/90 (10/07 0433) SpO2:  [91 %-100 %] 97 % (10/07 0433) Weight:  [76.8 kg] 76.8 kg (10/07 0433) Last BM Date: 10/02/18  Weight change: Filed Weights   10/02/18 1447 10/03/18 0433  Weight: 76.8 kg 76.8 kg     Intake/Output:   Intake/Output Summary (Last 24 hours) at 10/03/2018 0830 Last data filed at 10/02/2018 2200 Gross per 24 hour  Intake 345 ml  Output 3100 ml  Net -2755 ml      Physical Exam    General: In bed  No resp difficulty HEENT: normal Neck: supple. JVP 10. Carotids 2+ bilat; no bruits. No lymphadenopathy or thyromegaly appreciated. Cor: PMI nondisplaced. Regular rate & rhythm. No rubs, gallops.  2/6 HSM LLSB/apex.  Lungs: clear Abdomen: soft, nontender, nondistended. No hepatosplenomegaly. No bruits or masses. Good bowel sounds. Extremities: no cyanosis, clubbing, rash, edema Neuro: alert & orientedx3, cranial nerves grossly intact. moves all 4 extremities w/o difficulty. Affect pleasant   Telemetry   NSR 90s personally reviewed.   EKG   SR 92 bpm with occasional PVCs.   Labs   Basic Metabolic Panel: Recent Labs  Lab 10/02/18 0210 10/02/18 0459 10/02/18 1459 10/03/18 0530  NA 140  --  138 138  K 4.1  --  3.8 4.3  CL 109  --  103 105  CO2 22  --  28 24  GLUCOSE 162*  --  85 110*  BUN 20  --  17 17  CREATININE 1.42*  --  1.41* 1.29*  CALCIUM 8.5*  --  8.4* 8.2*  MG  --  2.0  --   --   PHOS  --   --  2.7  --     Liver Function Tests: Recent Labs  Lab 10/02/18 0459 10/02/18 1459  AST 24  --   ALT 38  --   ALKPHOS 80  --   BILITOT 0.7  --   PROT 6.4*  --   ALBUMIN 3.1* 3.0*   Recent Labs  Lab 10/02/18 0459  LIPASE 39   No results for input(s): AMMONIA in the last 168 hours.  CBC: Recent Labs  Lab 10/02/18 0210 10/03/18 0530  WBC 7.0 8.2  HGB 13.6 13.5  HCT 41.9 41.9  MCV 89.7 88.4  PLT 207 200    Cardiac Enzymes: Recent Labs  Lab 10/02/18 0459  TROPONINI <0.03    BNP: BNP (last 3 results) Recent Labs    08/06/18 0819 09/10/18 0731 10/02/18 0459  BNP 1,277.7* 1,637.1* 1,660.5*    ProBNP (last 3 results) No results for input(s): PROBNP in the last 8760 hours.   CBG: No results for input(s): GLUCAP in the  last 168 hours.  Coagulation Studies: No results for input(s): LABPROT, INR in the last 72 hours.   Imaging   No results found.   Medications:     Current Medications: . enoxaparin (LOVENOX) injection  40 mg Subcutaneous Q24H    Infusions:     Patient Profile   Tyler Wong is a 52 year old with history of chronic systolic heart failure, CAD MI 2015 with DES to LAD , HTN, hyperlipidemia, and cocaine abuse. He has been using cocaine for 30 years. Typically uses cocaine once a month. Drinks 1 beer a week. Smokes 1 PPD every 3 days. Runs out medications every month because he cant get refills..    Assessment/Plan   1. A/C Systolic Heart Failure In 2015 EF 40-45% and now down to 20-25%. Likely ICM but also could be familial with sister and mom having heart failure  Volume status improved with IV lasix.  Continue IV lasix  40 mg IV twice a day today. Start entresto 24-26 mg twice a day. He has not had lisinopril in 4 days.  Add 3.125 mg carvedilol twice a day.  RHC/LHC today.   2. CAD  Had [place] 2015 with DES to LAD Restart lipitor and 81 mg aspirin  3. Cocaine Abuse  4. Tobacco Abuse Discussed cessation.   5. Social  Limited income. Has hard time pay for medications and transportation.   Refer to Paramedicine.  Medication concerns reviewed with patient and pharmacy team. Barriers identified: Yes money.   Length of Stay: 1  Amy Clegg, NP  10/03/2018, 8:30 AM  Advanced Heart Failure Team Pager [phone] (M-F; [place])  Please contact [facility] for night-coverage after hours (4p -7a ) and weekends on [email or web address]  Patient seen with NP, agree with the above note.   Patient has history of CAD and ischemic cardiomyopathy, had LAD PCI in 2015.  He continues to use cocaine occasionally and to smoke cigarettes.  He had been on cardiac meds but had been out for "several days" prior to admission.  He came to the hospital with progressive dyspnea, no chest pain.  He has  diuresed well so far with IV Lasix.  ECG with NSR, old anterior MI.   On exam, JVP 10 cm, 2/6 HSM LLSB/apex, clear lungs, no edema.   1. Acute on chronic systolic CHF: Echo in 4540 with EF 40-45%, thought to be ischemic cardiomyopathy.  Now with EF down to 20-25% and RV dysfunction.  Worsening coronary disease is certainly a possibility for fall in EF though he denies chest pain.  Additionally, he has a strong family history of CHF/cardiomyopathy, so may be a component of familial cardiomyopathy.  Cocaine additionally could play a role in fall in EF.  On exam, he is still mildly volume overloaded and he says that he still feels short of breath.  - He will need left/right heart catheterization, especially to look for progressive CAD as cause  in fall in EF.  Will arrange to do this afternoon, will keep NPO.  I discussed risks/benefits with patient and he agrees to proceed.  - Continue Lasix 40 mg IV bid today, diuresed well overnight.  - Can restart Coreg 3.125 mg bid and will add Entresto 24/26 bid.  - If BP stable tomorrow, will add back eplerenone.  2. CAD: H/o anterior STEMI with DES to LAD in 7/15. No chest pain recently but concern that fall in EF may be related to progression of CAD.  - Cath today as above.  - Restart ASA 81 and atorvastatin 80 mg daily.  3. Cocaine abuse: Counseled to quit.  4. Active smoker: Counseled to quit.  5. Social: He has no insurance.  Will involve social work, will try to get him in paramedicine program.  Loralie Champagne 10/03/2018 8:51 AM

## 2018-10-03 NOTE — Telephone Encounter (Signed)
Patient referral to HF [facility] received.  All appropriate paperwork sent via secure email to the Paramedic team.

## 2018-10-03 NOTE — Progress Notes (Signed)
Note for MEWS pilot:  Charge RN notices red MEWS. Reason for high mews score is due to patient leaving the floor without permission to search for food while frequent vitals were automatically inputting. Primary RN has notifed MD and waiting for response. Vitals obtained and WNL. MEWS is now normal.

## 2018-10-03 NOTE — Discharge Summary (Addendum)
Name: Tyler Wong MRN: [id number] DOB: 1966-05-07 52 y.o. PCP: Patient, No Pcp Per  Date of Admission: 10/02/2018  2:03 AM Date of Discharge: 10/8/201910/07/2018 Attending Physician: No att. providers found  Discharge Diagnosis: 1. Acute on Chronic Systolic Heart Failure  Discharge Medications: Allergies as of 10/04/2018   No Known Allergies     Medication List    STOP taking these medications   lisinopril 10 MG tablet Commonly known as:  PRINIVIL,ZESTRIL     TAKE these medications   albuterol 108 (90 Base) MCG/ACT inhaler Commonly known as:  PROVENTIL HFA;VENTOLIN HFA Inhale 2 puffs into the lungs every 4 (four) hours as needed for wheezing or shortness of breath.   aspirin 81 MG EC tablet Take 1 tablet (81 mg total) by mouth daily.   atorvastatin 80 MG tablet Commonly known as:  LIPITOR Take 1 tablet (80 mg total) by mouth daily.   carvedilol 3.125 MG tablet Commonly known as:  COREG Take 1 tablet (3.125 mg total) by mouth 2 (two) times daily with a meal.   eplerenone 25 MG tablet Commonly known as:  INSPRA Take 1 tablet (25 mg total) by mouth daily. Stop spironolactone   furosemide 40 MG tablet Commonly known as:  LASIX Take 1 tablet (40 mg total) by mouth 2 (two) times daily. What changed:  Another medication with the same name was removed. Continue taking this medication, and follow the directions you see here.   multivitamin tablet Take 1 tablet by mouth daily.   NITROSTAT 0.4 MG SL tablet Generic drug:  nitroGLYCERIN PLACE 1 TABLET UNDER TONGUE AS NEEDED FOR CHEST PAIN EVERY 5 MINUTES X 3 MAX DOSES.  CALL 911 IF PAIN PERSISTS What changed:  See the new instructions.   sacubitril-valsartan 24-26 MG Commonly known as:  ENTRESTO Take 1 tablet by mouth 2 (two) times daily.   spironolactone 25 MG tablet Commonly known as:  ALDACTONE Take 0.5 tablets (12.5 mg total) by mouth daily.       Disposition and follow-up:   [name] was discharged  from [facility] in Stable condition.  At the hospital follow up visit please address:  1.  Acute on Chronic Systolic HF: reassess medication compliance and understanding of new medical regimen  2.  Labs / imaging needed at time of follow-up: none  3.  Pending labs/ test needing follow-up: none  Follow-up Appointments: Follow-up Information    [facility]. Go on 10/17/2018.   Specialty:  Cardiology Why:  In the [facility] at 11:30AM.  Please bring all medications to appt.  [place] code 1700 for October.  Contact information: [address] [id number] [place] [facility] Course by problem list: 1. Acute on chronic systolic HF- [name] presented with a 3 day history of SOB, lower extremity swelling, and abdominal pain. He reported he ran out of his lasix 3 days ago and started notice difficulty breathing especially when he laid down. He said he felt a pressure on the right side of his abdomen and left flank. He said he has had this issue in the past where he runs out of his lasix and presents to the ED because of SOB and his volume status. He said he normally feels better after being diuresed but today still feels swollen and SOB after urinating ~1L. Denied any trouble urinitating. CXR showed cardiomegaly with new mild  edema and small bilateral effusions with underlying opacity, likely atelectasis. HF was consulted and they recommended starting Entresto and performed a heart catheterization to look for progressive CAD as the cause in the fall in EF. He was started on lasix 40 mg bid, entresto 24-26 mg, 3.125 mg carvedilol bid, and spironolactone 12.5 mg qd. He is to follow up with Cardiology outpatient. He was down 8 pounds on day of discharge after being diuresed for two days with IV lasix.    Left/right heart catheterization showed :  1. Elevated left heart  filling pressure (PCWP and LVEDP).  2. Preserved cardiac output.  3. Primarily pulmonary venous hypertension.  4. Nondominant RCA providing a PLV with 70% stenosis. 90% ostial moderate OM1. Will plan medical management of coronary disease. No chest pain and does not explain fall in EF.  Discharge Vitals:   BP 112/88   Pulse 75   Temp 98.4 F (36.9 C) (Oral)   Resp 18   Ht 5' 10.5" (1.791 m)   Wt 73.2 kg Comment: Scale C  SpO2 99%   BMI 22.83 kg/m   Pertinent Labs, Studies, and Procedures:   CBC Latest Ref Rng & Units 10/03/2018 10/02/2018 09/10/2018  WBC 4.0 - 10.5 K/uL 8.2 7.0 7.2  Hemoglobin 13.0 - 17.0 g/dL 13.5 13.6 13.9  Hematocrit 39.0 - 52.0 % 41.9 41.9 43.3  Platelets 150 - 400 K/uL 200 207 215    CMP Latest Ref Rng & Units 10/04/2018 10/03/2018 10/02/2018  Glucose 70 - 99 mg/dL 123(H) 110(H) 85  BUN 6 - 20 mg/dL 19 17 17   Creatinine 0.61 - 1.24 mg/dL 1.33(H) 1.29(H) 1.41(H)  Sodium 135 - 145 mmol/L 137 138 138  Potassium 3.5 - 5.1 mmol/L 4.0 4.3 3.8  Chloride 98 - 111 mmol/L 104 105 103  CO2 22 - 32 mmol/L 26 24 28   Calcium 8.9 - 10.3 mg/dL 8.5(L) 8.2(L) 8.4(L)  Total Protein 6.5 - 8.1 g/dL - - -  Total Bilirubin 0.3 - 1.2 mg/dL - - -  Alkaline Phos 38 - 126 U/L - - -  AST 15 - 41 U/L - - -  ALT 0 - 44 U/L - - -     Dg Chest 2 View  Result Date: 10/02/2018 CLINICAL DATA:  Shortness of breath. EXAM: CHEST - 2 VIEW COMPARISON:  September 10, 2018 FINDINGS: Stable cardiomegaly. No pneumothorax. Scarring in the right upper lung is stable. Increasing interstitial opacities suggest edema. I suspect small effusions with underlying atelectasis. IMPRESSION: Cardiomegaly with new mild edema. Small bilateral effusions with underlying opacity, likely atelectasis. Electronically Signed   By: Dorise Bullion III M.D   On: 10/02/2018 02:40   Dg Chest 2 View  Result Date: 09/10/2018 CLINICAL DATA:  Shortness of breath for 1 day EXAM: CHEST - 2 VIEW COMPARISON:  08/06/2018  FINDINGS: Cardiac shadow remains enlarged. The lungs are well aerated bilaterally with some mild scarring in the right upper lobe. No focal infiltrate or sizable effusion is seen. Minimal scarring is noted posteriorly in the right lung base stable from the previous exam as well as a prior CT from 2018. No bony abnormality is noted. IMPRESSION: Mild chronic scarring without acute abnormality. Electronically Signed   By: Inez Catalina M.D.   On: 09/10/2018 07:54     Discharge Instructions: Discharge Instructions    Diet - low sodium heart healthy   Complete by:  As directed    Discharge instructions   Complete by:  As directed  Tyler Wong,  Please note the following changes to your medications:   - START Entresto 24-26 mg twice a day - CONTINUE 3.125 mg carvedilol twice a day - START Spironolactone 12.5 mg daily once (half of one tablet a day) - CONTINUE Furosemide (Lasix) 40 mg twice a day  - STOP taking Lisinopril 10 mg daily   Thank you for allowing [place] to be a part of your care!   Increase activity slowly   Complete by:  As directed       Signed: Mike Craze, DO 10/04/2018, 4:04 PM   Pager: [phone]

## 2018-10-03 NOTE — Plan of Care (Signed)
  Problem: Education: Goal: Knowledge of General Education information will improve Description: Including pain rating scale, medication(s)/side effects and non-pharmacologic comfort measures Outcome: Progressing   Problem: Activity: Goal: Risk for activity intolerance will decrease Outcome: Progressing   Problem: Pain Managment: Goal: General experience of comfort will improve Outcome: Progressing   

## 2018-10-03 NOTE — Progress Notes (Addendum)
   Subjective: [name] reported feeling well today. He said his breathing has improved. He is still having some pressure in his abdomen. He denies any lower extremity swelling.   Objective:  Vital signs in last 24 hours: Vitals:   10/02/18 1447 10/02/18 2134 10/03/18 0014 10/03/18 0433  BP: (!) 119/92 121/84 125/90 123/90  Pulse: 91 (!) 101 97 80  Resp: 18 (!) 22 18 18   Temp: 97.6 F (36.4 C) 98.8 F (37.1 C) 99 F (37.2 C) 98.6 F (37 C)  TempSrc: Oral Oral Oral Oral  SpO2: 98% 96% 96% 97%  Weight: 76.8 kg   76.8 kg  Height: 5' 10.5" (1.791 m)      General- seen lying in bed in NAD Heart- RRR, no murmurs Lungs- CTA bilaterally, normal effort Abdomen- no distension, non tender, bowel sounds present Extremities- no edema  Assessment/Plan:  Active Problems:   Acute combined systolic and diastolic congestive heart failure [facility])  [name] is a 51 yo male with a PMHx of CAD, past MI, combined systolic and diastolic CHF, cocaine use, HLD, HTN, and COPD presenting with a 3 day history of SOB, orthopnea, lower extremity swelling, and abdominal pain after his ran out of his lasix.  Acute on chronic systolic heart failure - last furosemide dose was 3 days ago; euvolemic on exam, given 40 mg IV furosemide x2 - urine output ~3.5 L over the past 24 hours - Echo LV EF 34-19%, grade 3 diastolic dysfunction, diffuse hypokinesis and overall slightly worse than last echo - consulted heart failure, appreciate their recommendations - continue IV lasix 40 mg bid, start entresto 24-26 mg, add 3.125 mg carvedilol bid  - cardiology planning on left/right heart catheterization today to look for progressive CAD as cause in fall in EF  CAD Prior MI - Total proximal to mid LAD occlusion, 07/17/14; underwent successful DES stenting of his LAD  - restart atorvastatin 80 mg, carvedilol 3.125 mg bid  HLD - restart atorvastatin 80 mg qd   HTN  - normotensive on admission   - hold lisinopril  10 mg qd  - If BP stable tomorrow, add back eplerenone per HF  COPD  - continue albuterol q4h prn - started on 2L [place]   Dispo: Anticipated discharge is approximately in 1 day  Mike Craze, DO 10/03/2018, 7:13 AM Pager: [phone]

## 2018-10-03 NOTE — Interval H&P Note (Signed)
History and Physical Interval Note:  10/03/2018 2:07 PM  Tyler Wong  has presented today for surgery, with the diagnosis of hf  The various methods of treatment have been discussed with the patient and family. After consideration of risks, benefits and other options for treatment, the patient has consented to  Procedure(s): RIGHT/LEFT HEART CATH AND CORONARY ANGIOGRAPHY (N/A) as a surgical intervention .  The patient's history has been reviewed, patient examined, no change in status, stable for surgery.  I have reviewed the patient's chart and labs.  Questions were answered to the patient's satisfaction.     Vale Peraza [employer]

## 2018-10-03 NOTE — Progress Notes (Signed)
Medicine attending: I examined this patient today and reviewed clinical and laboratory database.  I concur with the evaluation and management plan as recorded by resident physician Dr Harlow Ohms.  We appreciate heart failure service consultation and will follow recommendations.  Patient symptomatically improved following total of 80 mg of parenteral furosemide yesterday.  Good diuresis but no decrease in his baseline weight.  Lungs with minimal rales at the right base.  Borderline jugular venous distention.  Regular cardiac rhythm.  No S3 gallop.  No peripheral edema. Echocardiogram shows progressive systolic and diastolic dysfunction with estimated LVEF 20-25% and grade 3 diastolic dysfunction.  In addition, there is moderate mitral regurgitation and moderate to severe tricuspid regurgitation.  Impression: Acute on chronic systolic and diastolic heart failure. Continue parenteral diuretics.  Add Entresto and Coreg.  Resume lisinopril?

## 2018-10-04 DIAGNOSIS — I5041 Acute combined systolic (congestive) and diastolic (congestive) heart failure: Secondary | ICD-10-CM

## 2018-10-04 LAB — BASIC METABOLIC PANEL
Anion gap: 7 (ref 5–15)
BUN: 19 mg/dL (ref 6–20)
CO2: 26 mmol/L (ref 22–32)
Calcium: 8.5 mg/dL — ABNORMAL LOW (ref 8.9–10.3)
Chloride: 104 mmol/L (ref 98–111)
Creatinine, Ser: 1.33 mg/dL — ABNORMAL HIGH (ref 0.61–1.24)
GFR calc Af Amer: >60 mL/min (ref >60)
GFR calc non Af Amer: 60 mL/min — ABNORMAL LOW (ref >60)
Glucose, Bld: 123 mg/dL — ABNORMAL HIGH (ref 70–99)
Potassium: 4 mmol/L (ref 3.5–5.1)
Sodium: 137 mmol/L (ref 135–145)

## 2018-10-04 MED ORDER — FUROSEMIDE 10 MG/ML IJ SOLN: 40.0000 mg | Freq: Two times a day (BID) | INTRAMUSCULAR | Status: DC

## 2018-10-04 MED ORDER — SPIRONOLACTONE 12.5 MG HALF TABLET
12.5000 mg | ORAL_TABLET | Freq: Every day | ORAL | Status: DC
  Administered 2018-10-04: 12.5 mg via ORAL
  Filled 2018-10-04: qty 1

## 2018-10-04 MED ORDER — FUROSEMIDE 40 MG PO TABS
40.0000 mg | ORAL_TABLET | Freq: Two times a day (BID) | ORAL | Status: DC
  Administered 2018-10-04: 40 mg via ORAL

## 2018-10-04 MED ORDER — FUROSEMIDE 40 MG PO TABS
40.0000 mg | ORAL_TABLET | Freq: Two times a day (BID) | ORAL | Status: DC
  Filled 2018-10-04: qty 1

## 2018-10-04 MED ORDER — SPIRONOLACTONE 25 MG PO TABS: 12.5000 mg | ORAL_TABLET | Freq: Every day | ORAL | 0 refills | Status: DC

## 2018-10-04 MED ORDER — SACUBITRIL-VALSARTAN 24-26 MG PO TABS: 1.0000 | ORAL_TABLET | Freq: Two times a day (BID) | ORAL | 0 refills | Status: DC

## 2018-10-04 MED FILL — Heparin Sodium (Porcine) Inj 1000 Unit/ML: INTRAMUSCULAR | Qty: 30 | Status: AC

## 2018-10-04 MED FILL — FUROSEMIDE 40 MG TAB: 40 | 34 days supply | Qty: 100 | Fill #0

## 2018-10-04 MED FILL — ENTRESTO 24 MG-26 MG TABLET: 24-26 | 30 days supply | Qty: 60 | Fill #0

## 2018-10-04 MED FILL — SPIRONOLACTONE 25 MG TABLET: 25 | 34 days supply | Qty: 17 | Fill #0

## 2018-10-04 MED FILL — ATORVASTATIN 80 MG TABLET: 80 | 34 days supply | Qty: 34 | Fill #0

## 2018-10-04 MED FILL — CARVEDILOL 3.125 MG TABLET: 3.125 | 34 days supply | Qty: 68 | Fill #0

## 2018-10-04 MED FILL — ASPIRIN ADULT LOW STRENGTH: 81 | 34 days supply | Qty: 34 | Fill #0

## 2018-10-04 NOTE — Progress Notes (Addendum)
Advanced Heart Failure Rounding Note  PCP-Cardiologist: No primary care provider on file.   Subjective:    Yesterday, he was started on Entresto and coreg. He went for [facility], results below.   Great diuresis with I/O -4.2 L. Weight down 8 lbs. SBP 100-110s. BMET pending.  Feels much better today. No CP, SOB, orthopnea. Walked in hallways with no problem. No dizziness.  R/LHC 10/03/18: [facility] Procedural Findings: Hemodynamics (mmHg) RA mean 7 RV 55/9 PA 57/27, mean 39 PCWP mean 20 LV 95/26 AO 95/69  Oxygen saturations: PA 65% AO 93%  Cardiac Output (Fick) 5.09  Cardiac Index (Fick) 2.59 PVR 3.7 WU  Left Main  Short, no significant disease.  Left Anterior Descending  Patent proximal LAD stent. 30% proximal LAD stenosis just proximal to stent.  Ramus Intermedius  Luminal irregularities with moderate diffuse disease in the distal branches.  Left Circumflex  Dominant vessel providing left PDA. Luminal irregularities in the LCx itself. Moderate OM1 with 90% ostial stenosis.  Right Coronary Artery  Nondominant RCA. It does provide a moderate PLV branch with 70% mid-vessel stenosis.   1. Elevated left heart filling pressure (PCWP and LVEDP).  2. Preserved cardiac output.  3. Primarily pulmonary venous hypertension.  4. Nondominant RCA providing a PLV with 70% stenosis.  90% ostial moderate OM1.  Will plan medical management of coronary disease.  No chest pain and does not explain fall in EF.   Objective:   Weight Range: 73.2 kg Body mass index is 22.83 kg/m.   Vital Signs:   Temp:  [97.6 F (36.4 C)-98.9 F (37.2 C)] 97.6 F (36.4 C) (10/08 0644) Pulse Rate:  [0-104] 79 (10/08 0644) Resp:  [0-84] 18 (10/08 0644) BP: (97-125)/(76-95) 109/85 (10/08 0644) SpO2:  [0 %-100 %] 97 % (10/08 0644) Weight:  [73.2 kg] 73.2 kg (10/08 0644) Last BM Date: 10/02/18  Weight change: Filed Weights   10/02/18 1447 10/03/18 0433 10/04/18 0644  Weight: 76.8 kg 76.8 kg 73.2 kg      Intake/Output:   Intake/Output Summary (Last 24 hours) at 10/04/2018 0730 Last data filed at 10/04/2018 0647 Gross per 24 hour  Intake 360 ml  Output 4576 ml  Net -4216 ml      Physical Exam    General:  Lying in bed No resp difficulty HEENT: Normal Neck: Supple. JVP ~7. Carotids 2+ bilat; no bruits. No lymphadenopathy or thyromegaly appreciated. Cor: PMI nondisplaced. Regular rate & rhythm. No rubs, gallops. 2/6 HSM LLSB/apex Lungs: Clear Abdomen: Soft, nontender, nondistended. No hepatosplenomegaly. No bruits or masses. Good bowel sounds. Extremities: No cyanosis, clubbing, rash, edema Neuro: Alert & orientedx3, cranial nerves grossly intact. moves all 4 extremities w/o difficulty. Affect pleasant   Telemetry   NSR 70s. Personally reviewed.   EKG    No new tracings.  Labs    CBC Recent Labs    10/02/18 0210 10/03/18 0530  WBC 7.0 8.2  HGB 13.6 13.5  HCT 41.9 41.9  MCV 89.7 88.4  PLT 207 671   Basic Metabolic Panel Recent Labs    10/02/18 0459 10/02/18 1459 10/03/18 0530  NA  --  138 138  K  --  3.8 4.3  CL  --  103 105  CO2  --  28 24  GLUCOSE  --  85 110*  BUN  --  17 17  CREATININE  --  1.41* 1.29*  CALCIUM  --  8.4* 8.2*  MG 2.0  --   --   PHOS  --  2.7  --    Liver Function Tests Recent Labs    10/02/18 0459 10/02/18 1459  AST 24  --   ALT 38  --   ALKPHOS 80  --   BILITOT 0.7  --   PROT 6.4*  --   ALBUMIN 3.1* 3.0*   Recent Labs    10/02/18 0459  LIPASE 39   Cardiac Enzymes Recent Labs    10/02/18 0459  TROPONINI <0.03    BNP: BNP (last 3 results) Recent Labs    08/06/18 0819 09/10/18 0731 10/02/18 0459  BNP 1,277.7* 1,637.1* 1,660.5*    ProBNP (last 3 results) No results for input(s): PROBNP in the last 8760 hours.   D-Dimer No results for input(s): DDIMER in the last 72 hours. Hemoglobin A1C No results for input(s): HGBA1C in the last 72 hours. Fasting Lipid Panel No results for input(s): CHOL, HDL,  LDLCALC, TRIG, CHOLHDL, LDLDIRECT in the last 72 hours. Thyroid Function Tests No results for input(s): TSH, T4TOTAL, T3FREE, THYROIDAB in the last 72 hours.  Invalid input(s): FREET3  Other results:   Imaging     No results found.   Medications:     Scheduled Medications: . aspirin EC  81 mg Oral Daily  . atorvastatin  80 mg Oral q1800  . carvedilol  3.125 mg Oral BID WC  . enoxaparin (LOVENOX) injection  40 mg Subcutaneous Q24H  . furosemide  40 mg Intravenous BID  . sacubitril-valsartan  1 tablet Oral BID  . sodium chloride flush  3 mL Intravenous Q12H     Infusions: . sodium chloride       PRN Medications:  sodium chloride, acetaminophen, ondansetron (ZOFRAN) IV, senna-docusate, sodium chloride flush    Patient Profile   Tyler Wong is a 52 year old with history of chronic systolic heart failure, CAD MI 2015 with DES to LAD , HTN, hyperlipidemia, and cocaine abuse. He has been using cocaine for 30 years. Typically uses cocaine once a month. Drinks 1 beer a week. Smokes 1 PPD every 3 days. Runs out medications every month because he cant get refills..    Assessment/Plan   1. A/C Systolic Heart Failure due to NICM In 2015 EF 40-45% and now down to 20-25%. Likely ICM but also could be familial with sister and mom having heart failure  - R/LHC 10/03/18 with CAD, but does not explain drop in EF. Elevated filling pressure with preserved CO. - Volume status okay on exam. Elevated filling pressures on cath yesterday. Continue IV lasix this morning. Can probably then transition to PO. - Continue entresto 24-26 mg twice a day.  - Continue 3.125 mg carvedilol twice a day.  - Add spiro 12.5 mg daily once BMET comes back.  - Referred to HF paramedicine.   2. CAD  Had [place] 2015 with DES to LAD - As above, LHC yesterday showed CAD, but did not explain decreased in EF - Continue lipitor and 81 mg aspirin - No s/s ischemia.  3. Cocaine Abuse  4. Tobacco  Abuse Discussed cessation. No change.  5. Social  Limited income. Has hard time pay for medications and transportation. Has been referred to HF paramedicine  Medication concerns reviewed with patient and pharmacy team. Barriers identified: Yes money.   Length of Stay: [place], NP  10/04/2018, 7:30 AM  Advanced Heart Failure Team Pager [phone] (M-F; [place])  Please contact [facility] for night-coverage after hours (4p -7a ) and weekends on [email or web address]  Patient seen with NP, agree with the above note.  He diuresed well yesterday, weight down.  On exam, he looks euvolemic.  Feeling much better.   See cath results above.  He has significant CAD but no interventional target at this time (does not explain fall in EF).  It is possible that decline in EF is related to cocaine or even to a familial cardiomyopathy.   I think he can transition to po diuretic and potentially go home today.  We will follow him in CHF clinic.  Will arrange for paramedicine.  Will see if we can get his meds through HF fund.  Cardiac meds for home: Lasix 40 mg po bid, spironolactone 12.5 daily, Entresto 24/26 bid, Coreg 3.125 bid, ASA 81 daily, atorvastatin 80 mg daily.   Loralie Champagne 10/04/2018 8:41 AM

## 2018-10-04 NOTE — Progress Notes (Addendum)
I sent the following medications to be filled through the HF Fund at [facility] per Dr. Aundra Dubin.   Entresto 24/26 BID (30 day free card) Furosemide 40 mg BID Spironolactone 12.5mg  Daily Carvedilol 3.125mg  BID Atorvastatin 80 mg Daily ASA 81 mg Daily  I have delivered medications to patient bedside.  He is requesting a bus pass to help him get home.  I will discuss with Care Management.

## 2018-10-04 NOTE — Discharge Instructions (Signed)
Please note that the following medications are at the [facility] and not at the [place]:  Delene Loll 24/26 BID (30 day free card) Furosemide 40 mg BID Spironolactone 12.5mg  Daily Carvedilol 3.125mg  BID Atorvastatin 80 mg Daily ASA 81 mg Daily

## 2018-10-04 NOTE — Progress Notes (Signed)
Hospital follow-up appointment scheduled in the Advanced Heart Failure clinic for 10/17/18 at 11:30AM.

## 2018-10-04 NOTE — Progress Notes (Signed)
Medicine attending discharge note: I personally examined this patient on the day of planned discharge and I attest to the accuracy of the discharge evaluation and plan as recorded by resident physician Dr Harlow Ohms. We appreciate ongoing cardiology input.  Patient was taken for a right and left heart catheterization yesterday.  Not clear I understand all the results.  Preserved cardiac output but ejection fraction was not estimated.  This is a disconnect with the findings on echocardiogram.  Coronaries showed a 30% proximal LAD stenosis just proximal to previous stent.  The left posterior descending artery was the dominant vessel with 90% ostial stenosis in the obtuse marginal branch.  Apparently this area is getting collateral flow from other vessels. Presenting symptoms of dyspnea have resolved with parenteral diuresis.  Lungs are clear.  No JVD.  No gallop.  No peripheral edema. Weight down from 169 pounds to 161 pounds.  Good diuresis. Electrolytes normal.  No change in chronic kidney disease stage II. Cardiology recommends adding low-dose spironolactone to his regimen.  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  Dr. Marigene Ehlers raises issue of possible inherited cardiomyopathy given family history. I wonder if he would benefit from a cardiac MRI or a technetium pyrophosphate scan to look for infiltrative disease/transthyretin amyloidosis?  Disposition: Condition stable at time of discharge He will follow-up with his main cardiologist and also with the heart failure clinic. He needs a primary care referral. There were no complications

## 2018-10-04 NOTE — Progress Notes (Signed)
   Subjective: Mr. Friedel reported feeling well today. Denied SOB, orthopnea or LE swelling.   Objective:  Vital signs in last 24 hours: Vitals:   10/03/18 1815 10/03/18 2036 10/03/18 2200 10/04/18 0644  BP: 109/81 103/85 117/84 109/85  Pulse: 87 95 80 79  Resp:  19  18  Temp:  98.9 F (37.2 C)  97.6 F (36.4 C)  TempSrc:  Oral  Oral  SpO2:  91%  97%  Weight:    73.2 kg  Height:       General- seen lying in bed in NAD Heart- RRR, no murmurs Lungs- CTA bilaterally, normal effort, no rhonchi or crackles  Abdomen- bowel sounds present, no distension Extremities- no edema   Assessment/Plan:  Active Problems:   Acute combined systolic and diastolic congestive heart failure [facility])  Mr. Cech is a 52 yo male with a PMHx of CAD, past MI, combined systolic and diastolic CHF,cocaine use,HLD, HTN,andCOPD presenting witha 3 day history of SOB, orthopnea, lower extremity swelling, and abdominal pain after his ran out of his lasix.  Acute on chronic systolic heart failure - urine output ~4.5L over the past 24 hours - Echo LV EF 41-96%, grade 3 diastolic dysfunction, diffuse hypokinesis and overall slightly worse than last echo - consulted heart failure, appreciate their recommendations - continue IV lasix 40 mg bid, start entresto 24-26 mg, add 3.125 mg carvedilol bid, add spironolactone 12.5 mg qd - Left/right heart catheterization showed :  1. Elevated left heart filling pressure (PCWP and LVEDP).  2. Preserved cardiac output.  3. Primarily pulmonary venous hypertension.  4. Nondominant RCA providing a PLV with 70% stenosis. 90% ostial moderate OM1. Will plan medical management of coronary disease. No chest pain and does not explain fall in EF.   CAD Prior MI - Total proximal to mid LAD occlusion, 07/17/14; underwent successful DES stenting of his LAD  -medical management as above  HLD -restartatorvastatin 80 mg qd   HTN  - normotensive on admission   -discontinuelisinopril 10 mg qd  - management as above  COPD  - continue albuterol q4h prn  Dispo: Anticipated discharge is today.   Mike Craze, DO 10/04/2018, 6:53 AM Pager: [phone]

## 2018-10-05 ENCOUNTER — Telehealth (HOSPITAL_COMMUNITY): Payer: Self-pay

## 2018-10-05 LAB — HIV ANTIBODY (ROUTINE TESTING W REFLEX): HIV Screen 4th Generation wRfx: NONREACTIVE

## 2018-10-05 NOTE — Telephone Encounter (Signed)
I called pt today regarding referral and to schedule home visit. Pts call went directly to VM, I left a message asking for a return call.   Marylouise Stacks, EMT-Paramedic  10/05/18

## 2018-10-05 NOTE — Progress Notes (Signed)
Patient ID: Tyler Wong, male   DOB: 01-31-1966, 52 y.o.   MRN: [id number]      Tyler Wong, is a 52 y.o. male  [id number]  [id number]  DOB - Jan 22, 1966  Subjective:  Chief Complaint and HPI: Tyler Wong is a 52 y.o. male here today to establish care and for a follow up visit After hospitalization from 10/06-10/07/2018 for acute on chronic SHF. He feels good today.  NO SOB.  No CP.  Still smoking about 5 cigs/day.  Needs RF on meds bc doesn't want to run out.   From discharge summary: Hospital Course by problem list: 1. Acute on chronic systolic HF- Mr. Harmes presented witha 3 day history of SOB, lower extremity swelling, and abdominal pain. He reported he ran out of his lasix 3 days ago and started notice difficulty breathing especially when he laid down. He said he felt a pressure on the right side of his abdomen and left flank. He said he has had this issue in the past where he runs out of his lasix and presents to the ED because of SOB and his volume status. He said he normally feels better after being diuresed but today still feels swollen and SOB after urinating ~1L. Denied any trouble urinitating. CXR showed cardiomegaly with new mild edema and small bilateral effusions with underlying opacity, likely atelectasis. HF was consulted and they recommended starting Entresto and performed a heart catheterization to look for progressive CAD as the cause in the fall in EF. He was started on lasix 40 mg bid, entresto 24-26 mg, 3.125 mg carvedilol bid, and spironolactone 12.5 mg qd. He is to follow up with Cardiology outpatient. He was down 8 pounds on day of discharge after being diuresed for two days with IV lasix.   Left/right heart catheterization showed : 1. Elevated left heart filling pressure (PCWP and LVEDP).  2. Preserved cardiac output.  3. Primarily pulmonary venous hypertension.  4. Nondominant RCA providing a PLV with 70% stenosis. 90% ostial moderate OM1. Will plan  medical management of coronary disease. No chest pain and does not explain fall in EF.  From I/P: 1.  Acute on Chronic Systolic HF: reassess medication compliance and understanding of new medical regimen  2.  Labs / imaging needed at time of follow-up: none  3.  Pending labs/ test needing follow-up: none  ED/Hospital notes reviewed and summarized above.    Family history: Mom and sister with MI, sister with HF  ROS:   Constitutional:  No f/c, No night sweats, No unexplained weight loss. EENT:  No vision changes, No blurry vision, No hearing changes. No mouth, throat, or ear problems.  Respiratory: No cough, No SOB Cardiac: No CP, no palpitations GI:  No abd pain, No N/V/D. GU: No Urinary s/sx Musculoskeletal: No joint pain Neuro: No headache, no dizziness, no motor weakness.  Skin: No rash Endocrine:  No polydipsia. No polyuria.  Psych: Denies SI/HI  No problems updated.  ALLERGIES: No Known Allergies  PAST MEDICAL HISTORY: Past Medical History:  Diagnosis Date  . CAD in native artery 07/17/14   STEMI- LAD stenosis with DES  . CHF (congestive heart failure) ([place])   . Cocaine abuse ([place])   . COPD (chronic obstructive pulmonary disease) ([place])   . Dyspnea   . Hypertension   . ST elevation myocardial infarction (STEMI) involving left anterior descending (LAD) coronary artery with complication ([place])   . Tobacco abuse 07/17/2014    MEDICATIONS AT HOME: Prior to Admission medications  Medication Sig Start Date End Date Taking? Authorizing Provider  albuterol (PROVENTIL HFA;VENTOLIN HFA) 108 (90 Base) MCG/ACT inhaler Inhale 2 puffs into the lungs every 4 (four) hours as needed for wheezing or shortness of breath. 10/06/18  Yes Argentina Donovan, PA-C  aspirin 81 MG EC tablet Take 1 tablet (81 mg total) by mouth daily. 10/06/18  Yes Zanyah Lentsch M, PA-C  atorvastatin (LIPITOR) 80 MG tablet Take 1 tablet (80 mg total) by mouth daily. 10/06/18  Yes Freeman Caldron M, PA-C   carvedilol (COREG) 3.125 MG tablet Take 1 tablet (3.125 mg total) by mouth 2 (two) times daily with a meal. 10/06/18 01/04/19 Yes Ammar Moffatt, Dionne Bucy, PA-C  furosemide (LASIX) 40 MG tablet Take 1 tablet (40 mg total) by mouth 2 (two) times daily. 10/06/18  Yes Argentina Donovan, PA-C  Multiple Vitamin (MULTIVITAMIN) tablet Take 1 tablet by mouth daily.   Yes [provider]  NITROSTAT 0.4 MG SL tablet PLACE 1 TABLET UNDER TONGUE AS NEEDED FOR CHEST PAIN EVERY 5 MINUTES X 3 MAX DOSES.  CALL 911 IF PAIN PERSISTS Patient taking differently: Place 0.4 mg under the tongue every 5 (five) minutes as needed for chest pain.  02/10/18  Yes [name], MD  sacubitril-valsartan (ENTRESTO) 24-26 MG Take 1 tablet by mouth 2 (two) times daily. 10/06/18  Yes Freeman Caldron M, PA-C  spironolactone (ALDACTONE) 25 MG tablet Take 0.5 tablets (12.5 mg total) by mouth daily. 10/06/18 11/05/18 Yes Beatriz Quintela, Dionne Bucy, PA-C     Objective:  EXAM:   Vitals:   10/06/18 1015  BP: 105/72  Pulse: 82  Resp: 18  Temp: 97.9 F (36.6 C)  TempSrc: Oral  SpO2: 97%  Weight: 177 lb (80.3 kg)  Height: 5\' 11"  (1.803 m)    General appearance : A&OX3. NAD. Non-toxic-appearing HEENT: Atraumatic and Normocephalic.  PERRLA. EOM intact.  TM clear B. Mouth-MMM, post pharynx WNL w/o erythema, No PND. Neck: supple, no JVD. No cervical lymphadenopathy. No thyromegaly Chest/Lungs:  Breathing-non-labored, Good air entry bilaterally, breath sounds normal without rales, rhonchi, or wheezing  CVS: S1 S2 regular, no murmurs, gallops, rubs  Extremities: Bilateral Lower Ext shows no edema, both legs are warm to touch with = pulse throughout Neurology:  CN II-XII grossly intact, Non focal.   Psych:  TP linear. J/I WNL. Normal speech. Appropriate eye contact and affect.  Skin:  No Rash  Data Review Lab Results  Component Value Date   HGBA1C 5.8 (H) 07/18/2014     Assessment & Plan   1. Hospital discharge follow-up Much  improved/no complaints today  2. Tobacco abuse He is doing great cutting back.  He is at about 5 cigs/day.  We discussed coming up with a plan, using patches, gum, etc as needed.  Discussed this at length.   - albuterol (PROVENTIL HFA;VENTOLIN HFA) 108 (90 Base) MCG/ACT inhaler; Inhale 2 puffs into the lungs every 4 (four) hours as needed for wheezing or shortness of breath.  Dispense: 1 Inhaler; Refill: 2  3. CAD S/P percutaneous coronary angioplasty Cardiology f/up 10/21 - atorvastatin (LIPITOR) 80 MG tablet; Take 1 tablet (80 mg total) by mouth daily.  Dispense: 90 tablet; Refill: 0 - sacubitril-valsartan (ENTRESTO) 24-26 MG; Take 1 tablet by mouth 2 (two) times daily.  Dispense: 60 tablet; Refill: 0 - aspirin 81 MG EC tablet; Take 1 tablet (81 mg total) by mouth daily.  Dispense: 90 tablet; Refill: 3  4. Chronic combined systolic and diastolic congestive heart failure ([place]) -  carvedilol (COREG) 3.125 MG tablet; Take 1 tablet (3.125 mg total) by mouth 2 (two) times daily with a meal.  Dispense: 180 tablet; Refill: 0 - sacubitril-valsartan (ENTRESTO) 24-26 MG; Take 1 tablet by mouth 2 (two) times daily.  Dispense: 60 tablet; Refill: 0 - furosemide (LASIX) 40 MG tablet; Take 1 tablet (40 mg total) by mouth 2 (two) times daily.  Dispense: 60 tablet; Refill: 1 - spironolactone (ALDACTONE) 25 MG tablet; Take 0.5 tablets (12.5 mg total) by mouth daily.  Dispense: 90 tablet; Refill: 0  5. Hyperlipidemia, unspecified hyperlipidemia type - atorvastatin (LIPITOR) 80 MG tablet; Take 1 tablet (80 mg total) by mouth daily.  Dispense: 90 tablet; Refill: 0  Patient have been counseled extensively about nutrition and exercise  Return in about 6 weeks (around 11/17/2018) for assign PCP; f/up CHF and hyperlipidemia.  The patient was given clear instructions to go to ER or return to medical center if symptoms don't improve, worsen or new problems develop. The patient verbalized understanding. The patient  was told to call to get lab results if they haven't heard anything in the next week.     Freeman Caldron, PA-C [facility] and [facility] [place], [place]   10/06/2018, 10:46 AM

## 2018-10-06 ENCOUNTER — Ambulatory Visit: Payer: Medicaid Other | Attending: Family Medicine | Admitting: Physician Assistant

## 2018-10-06 ENCOUNTER — Telehealth (HOSPITAL_COMMUNITY): Payer: Self-pay

## 2018-10-06 ENCOUNTER — Other Ambulatory Visit: Payer: Self-pay

## 2018-10-06 VITALS — BP 105/72 | HR 82 | Temp 97.9°F | Resp 18 | Ht 71.0 in | Wt 177.0 lb

## 2018-10-06 DIAGNOSIS — I5042 Chronic combined systolic (congestive) and diastolic (congestive) heart failure: Secondary | ICD-10-CM

## 2018-10-06 DIAGNOSIS — Z9861 Coronary angioplasty status: Secondary | ICD-10-CM

## 2018-10-06 DIAGNOSIS — J449 Chronic obstructive pulmonary disease, unspecified: Secondary | ICD-10-CM | POA: Diagnosis not present

## 2018-10-06 DIAGNOSIS — I110 Hypertensive heart disease with heart failure: Secondary | ICD-10-CM | POA: Diagnosis not present

## 2018-10-06 DIAGNOSIS — I5023 Acute on chronic systolic (congestive) heart failure: Secondary | ICD-10-CM | POA: Insufficient documentation

## 2018-10-06 DIAGNOSIS — I252 Old myocardial infarction: Secondary | ICD-10-CM | POA: Insufficient documentation

## 2018-10-06 DIAGNOSIS — E785 Hyperlipidemia, unspecified: Secondary | ICD-10-CM | POA: Diagnosis not present

## 2018-10-06 DIAGNOSIS — I2510 Atherosclerotic heart disease of native coronary artery without angina pectoris: Secondary | ICD-10-CM

## 2018-10-06 DIAGNOSIS — Z8249 Family history of ischemic heart disease and other diseases of the circulatory system: Secondary | ICD-10-CM | POA: Diagnosis not present

## 2018-10-06 DIAGNOSIS — F17210 Nicotine dependence, cigarettes, uncomplicated: Secondary | ICD-10-CM | POA: Diagnosis not present

## 2018-10-06 DIAGNOSIS — Z720 Tobacco use: Secondary | ICD-10-CM

## 2018-10-06 DIAGNOSIS — Z09 Encounter for follow-up examination after completed treatment for conditions other than malignant neoplasm: Secondary | ICD-10-CM

## 2018-10-06 MED ORDER — FUROSEMIDE 40 MG PO TABS: 40.0000 mg | ORAL_TABLET | Freq: Two times a day (BID) | ORAL | 1 refills | Status: DC

## 2018-10-06 MED ORDER — SPIRONOLACTONE 25 MG PO TABS: 12.5000 mg | ORAL_TABLET | Freq: Every day | ORAL | 0 refills | Status: DC

## 2018-10-06 MED ORDER — CARVEDILOL 3.125 MG PO TABS: 3.1250 mg | ORAL_TABLET | Freq: Two times a day (BID) | ORAL | 0 refills | Status: DC

## 2018-10-06 MED ORDER — ALBUTEROL SULFATE HFA 108 (90 BASE) MCG/ACT IN AERS: 2.0000 | INHALATION_SPRAY | RESPIRATORY_TRACT | 2 refills | Status: DC | PRN

## 2018-10-06 MED ORDER — FUROSEMIDE 40 MG PO TABS: 40.0000 mg | ORAL_TABLET | Freq: Two times a day (BID) | ORAL | 0 refills | Status: DC

## 2018-10-06 MED ORDER — ATORVASTATIN CALCIUM 80 MG PO TABS: 80.0000 mg | ORAL_TABLET | Freq: Every day | ORAL | 0 refills | Status: DC

## 2018-10-06 MED ORDER — SACUBITRIL-VALSARTAN 24-26 MG PO TABS: 1.0000 | ORAL_TABLET | Freq: Two times a day (BID) | ORAL | 0 refills | Status: DC

## 2018-10-06 MED ORDER — ASPIRIN 81 MG PO TBEC: 81.0000 mg | DELAYED_RELEASE_TABLET | Freq: Every day | ORAL | 3 refills | Status: DC

## 2018-10-06 NOTE — Telephone Encounter (Signed)
Attempted to call pt again regarding home visit, call went straight to VM. But it appears he made it to PCP visit today, if I dont see or talk to him next week I will catch him at his next clinic visit.   Marylouise Stacks, EMT-Paramedic  10/06/18

## 2018-10-11 ENCOUNTER — Telehealth (HOSPITAL_COMMUNITY): Payer: Self-pay | Admitting: Licensed Clinical Social Worker

## 2018-10-11 NOTE — Telephone Encounter (Signed)
Encounter entered in error.

## 2018-10-17 ENCOUNTER — Other Ambulatory Visit (HOSPITAL_COMMUNITY): Payer: Self-pay

## 2018-10-17 ENCOUNTER — Encounter (HOSPITAL_COMMUNITY): Payer: Self-pay

## 2018-10-17 ENCOUNTER — Ambulatory Visit (HOSPITAL_COMMUNITY)
Admission: RE | Admit: 2018-10-17 | Discharge: 2018-10-17 | Disposition: A | Discharge status: Home / Self Care | Payer: Medicaid Other | Source: Ambulatory Visit | Attending: Internal Medicine | Admitting: Internal Medicine

## 2018-10-17 VITALS — BP 118/68 | HR 68 | Wt 164.8 lb

## 2018-10-17 DIAGNOSIS — E785 Hyperlipidemia, unspecified: Secondary | ICD-10-CM | POA: Diagnosis not present

## 2018-10-17 DIAGNOSIS — I110 Hypertensive heart disease with heart failure: Secondary | ICD-10-CM | POA: Insufficient documentation

## 2018-10-17 DIAGNOSIS — I255 Ischemic cardiomyopathy: Secondary | ICD-10-CM

## 2018-10-17 DIAGNOSIS — J449 Chronic obstructive pulmonary disease, unspecified: Secondary | ICD-10-CM | POA: Insufficient documentation

## 2018-10-17 DIAGNOSIS — I10 Essential (primary) hypertension: Secondary | ICD-10-CM

## 2018-10-17 DIAGNOSIS — I2510 Atherosclerotic heart disease of native coronary artery without angina pectoris: Secondary | ICD-10-CM | POA: Insufficient documentation

## 2018-10-17 DIAGNOSIS — Z79899 Other long term (current) drug therapy: Secondary | ICD-10-CM | POA: Diagnosis not present

## 2018-10-17 DIAGNOSIS — F17210 Nicotine dependence, cigarettes, uncomplicated: Secondary | ICD-10-CM | POA: Insufficient documentation

## 2018-10-17 DIAGNOSIS — I428 Other cardiomyopathies: Secondary | ICD-10-CM | POA: Insufficient documentation

## 2018-10-17 DIAGNOSIS — Z7982 Long term (current) use of aspirin: Secondary | ICD-10-CM | POA: Diagnosis not present

## 2018-10-17 DIAGNOSIS — F1410 Cocaine abuse, uncomplicated: Secondary | ICD-10-CM | POA: Diagnosis not present

## 2018-10-17 DIAGNOSIS — Z8249 Family history of ischemic heart disease and other diseases of the circulatory system: Secondary | ICD-10-CM | POA: Insufficient documentation

## 2018-10-17 DIAGNOSIS — F17200 Nicotine dependence, unspecified, uncomplicated: Secondary | ICD-10-CM

## 2018-10-17 DIAGNOSIS — I252 Old myocardial infarction: Secondary | ICD-10-CM | POA: Insufficient documentation

## 2018-10-17 DIAGNOSIS — I5022 Chronic systolic (congestive) heart failure: Secondary | ICD-10-CM | POA: Insufficient documentation

## 2018-10-17 DIAGNOSIS — I5042 Chronic combined systolic (congestive) and diastolic (congestive) heart failure: Secondary | ICD-10-CM

## 2018-10-17 LAB — BASIC METABOLIC PANEL
Anion gap: 9 (ref 5–15)
BUN: 34 mg/dL — ABNORMAL HIGH (ref 6–20)
CO2: 25 mmol/L (ref 22–32)
Calcium: 8.8 mg/dL — ABNORMAL LOW (ref 8.9–10.3)
Chloride: 104 mmol/L (ref 98–111)
Creatinine, Ser: 1.31 mg/dL — ABNORMAL HIGH (ref 0.61–1.24)
GFR calc Af Amer: >60 mL/min (ref >60)
GFR calc non Af Amer: >60 mL/min (ref >60)
Glucose, Bld: 91 mg/dL (ref 70–99)
Potassium: 4.3 mmol/L (ref 3.5–5.1)
Sodium: 138 mmol/L (ref 135–145)

## 2018-10-17 MED ORDER — SPIRONOLACTONE 25 MG PO TABS: 25.0000 mg | ORAL_TABLET | Freq: Every day | ORAL | 3 refills | Status: DC

## 2018-10-17 NOTE — Patient Instructions (Signed)
INCREASE Spironolactone to 25 mg, one tab daily   Labs today We will only contact you if something comes back abnormal or we need to make some changes. Otherwise no news is good news!   Your physician recommends that you schedule a follow-up appointment in: 2-3 weeks  in the Advanced Practitioners (PA/NP) Clinic    Do the following things EVERYDAY: 1) Weigh yourself in the morning before breakfast. Write it down and keep it in a log. 2) Take your medicines as prescribed 3) Eat low salt foods-Limit salt (sodium) to 2000 mg per day.  4) Stay as active as you can everyday 5) Limit all fluids for the day to less than 2 liters

## 2018-10-17 NOTE — Progress Notes (Signed)
PCP: None  Primary Cardiologist: Dr Aundra Dubin   HPI: Tyler Wong is a 52 year old with history of chronic systolic heart failure, CAD MI 2015 with DES to LAD , HTN, hyperlipidemia, and cocaine abuse.He has been using cocaine for 30 years. Typically uses cocaine once a month. Drinks 1 beer a week. Smokes 1 PPD every 3 days. Runs out medications every month because he cant get refills..  Admitted 10/02/18 with increased shortness of breath and chest pain. Underwent RHC/LHC with  significant CAD but no target. Possible that low EF is due to cocaine abuse. HF medications started. He was given 30 day supply of HF medications prior to discharge. He was referred to paramedicine.   Today he returns for post hospital follow up. Overall feeling fine. Has SOB with steps and brisk walking. Denies PND/Orthopnea. Appetite ok. No fever or chills. No chest pain. Weight at home has been stable. No chest pain. Smoking 3-4 cigarettes per day. aking all medications. He has trouble with transportation and getting food. He has trouble paying for medications.   Echo 10/02/2018  RV moderately dilated. RA severly dilated.  . LV EF20-25% severely reduced with ventricular dilation. Wall motion appears diffusely worse at apex, involving dyskinesis of inferior apex and severe hypokinesis/akinesis of the anterior, anterolateral, inferolateral, and inferoseptal apex. 2. At least moderate Tyler due to functional noncoaptation of the leaflets. 3. Moderate-severe TR with elevated   R/LHC 10/03/18: RHC Procedural Findings: Hemodynamics (mmHg) RA mean 7 RV 55/9 PA 57/27, mean 39 PCWP mean 20 LV 95/26 AO 95/69 Oxygen saturations: PA 65% AO 93% Cardiac Output (Fick) 5.09  Cardiac Index (Fick) 2.59 PVR 3.7 WU  Left Main  Short, no significant disease.  Left Anterior Descending  Patent proximal LAD stent. 30% proximal LAD stenosis just proximal to stent.  Ramus Intermedius  Luminal irregularities with  moderate diffuse disease in the distal branches.  Left Circumflex  Dominant vessel providing left PDA. Luminal irregularities in the LCx itself. Moderate OM1 with 90% ostial stenosis.  Right Coronary Artery  Nondominant RCA. It does provide a moderate PLV branch with 70% mid-vessel stenosis.   1. Elevated left heart filling pressure (PCWP and LVEDP).  2. Preserved cardiac output.  3. Primarily pulmonary venous hypertension.  4. Nondominant RCA providing a PLV with 70% stenosis. 90% ostial moderate OM1. Will plan medical management of coronary disease. No chest pain and does not explain fall in EF.    ROS: All systems negative except as listed in HPI, PMH and Problem List.  SH:  Social History   Socioeconomic History  . Marital status: Single    Spouse name: Not on file  . Number of children: Not on file  . Years of education: Not on file  . Highest education level: Not on file  Occupational History  . Occupation: Landscaping  Social Needs  . Financial resource strain: Not on file  . Food insecurity:    Worry: Not on file    Inability: Not on file  . Transportation needs:    Medical: Not on file    Non-medical: Not on file  Tobacco Use  . Smoking status: Current Every Day Smoker    Packs/day: 0.50    Years: 30.00    Pack years: 15.00    Types: Cigarettes  . Smokeless tobacco: Never Used  . Tobacco comment: .5 PK PER DAY  Substance and Sexual Activity  . Alcohol use: No    Comment: Patient denies abuse, states social drinker  .  Drug use: Yes    Types: Cocaine    Comment: heroin  . Sexual activity: Yes  Lifestyle  . Physical activity:    Days per week: Not on file    Minutes per session: Not on file  . Stress: Not on file  Relationships  . Social connections:    Talks on phone: Not on file    Gets together: Not on file    Attends religious service: Not on file    Active member of club or organization: Not on file    Attends meetings of clubs or  organizations: Not on file    Relationship status: Not on file  . Intimate partner violence:    Fear of current or ex partner: Not on file    Emotionally abused: Not on file    Physically abused: Not on file    Forced sexual activity: Not on file  Other Topics Concern  . Not on file  Social History Narrative   Lives in [place] with his sister and girlfriend. He works Aeronautical engineer work.     FH:  Family History  Problem Relation Age of Onset  . Cirrhosis Father   . Heart attack Mother   . Heart attack Sister   . Heart failure Sister   . Heart attack Brother     Past Medical History:  Diagnosis Date  . CAD in native artery 07/17/14   STEMI- LAD stenosis with DES  . CHF (congestive heart failure) ([place])   . Cocaine abuse ([place])   . COPD (chronic obstructive pulmonary disease) ([place])   . Dyspnea   . Hypertension   . ST elevation myocardial infarction (STEMI) involving left anterior descending (LAD) coronary artery with complication ([place])   . Tobacco abuse 07/17/2014    Current Outpatient Medications  Medication Sig Dispense Refill  . albuterol (PROVENTIL HFA;VENTOLIN HFA) 108 (90 Base) MCG/ACT inhaler Inhale 2 puffs into the lungs every 4 (four) hours as needed for wheezing or shortness of breath. 1 Inhaler 2  . aspirin 81 MG EC tablet Take 1 tablet (81 mg total) by mouth daily. 90 tablet 3  . atorvastatin (LIPITOR) 80 MG tablet Take 1 tablet (80 mg total) by mouth daily. 90 tablet 0  . carvedilol (COREG) 3.125 MG tablet Take 1 tablet (3.125 mg total) by mouth 2 (two) times daily with a meal. 180 tablet 0  . furosemide (LASIX) 40 MG tablet Take 1 tablet (40 mg total) by mouth 2 (two) times daily. 180 tablet 0  . Multiple Vitamin (MULTIVITAMIN) tablet Take 1 tablet by mouth daily.    Marland Kitchen NITROSTAT 0.4 MG SL tablet PLACE 1 TABLET UNDER TONGUE AS NEEDED FOR CHEST PAIN EVERY 5 MINUTES X 3 MAX DOSES.  CALL 911 IF PAIN PERSISTS (Patient taking differently: Place 0.4 mg  under the tongue every 5 (five) minutes as needed for chest pain. ) 25 tablet 3  . sacubitril-valsartan (ENTRESTO) 24-26 MG Take 1 tablet by mouth 2 (two) times daily. 60 tablet 0  . spironolactone (ALDACTONE) 25 MG tablet Take 0.5 tablets (12.5 mg total) by mouth daily. 90 tablet 0   No current facility-administered medications for this encounter.     Vitals:   10/17/18 1129  BP: 118/68  Pulse: 68  SpO2: 96%  Weight: 74.8 kg (164 lb 12.8 oz)   Wt Readings from Last 3 Encounters:  10/17/18 74.8 kg (164 lb 12.8 oz)  10/06/18 80.3 kg (177 lb)  10/04/18 73.2 kg (161 lb 6.4  oz)    PHYSICAL EXAM: General:  Appears anxious. No resp difficulty HEENT: normal Neck: supple. JVP flat. Carotids 2+ bilaterally; no bruits. No lymphadenopathy or thryomegaly appreciated. Cor: PMI normal. Regular rate & rhythm. No rubs, gallops or murmurs. No 3rd heart sound.  Lungs: clear Abdomen: soft, nontender, nondistended. No hepatosplenomegaly. No bruits or masses. Good bowel sounds. Extremities: no cyanosis, clubbing, rash, edema Neuro: alert & orientedx3, cranial nerves grossly intact. Moves all 4 extremities w/o difficulty. Affect pleasant.   ASSESSMENT & PLAN: 1. Chronic Systolic Heart Failure due to NICM In 2015 EF 40-45% and now down to 20-25%. Likely ICM but also could be familial with sister and mom having heart failure - R/LHC 10/03/18 with CAD, but does not explain drop in EF.  -NYHA IIIB. Volume status stable. Continue lasix 40 mg twice a day.  -Increase spironolactone to 25 mg daily - Continue carvedilol 3.125 mg twice a day  - Continue entresto 24-26 mg twice a day, . -Plan to repeat ECHO in January after HF medications optimized.   2. CAD  Had [place] 2015 with DES to LAD - As above, LHC showed CAD, but did not explain decreased in EF - Continue lipitor and 81 mg aspirin - No s/s ischemia   3.Cocaine Abuse He has not used in the last few weeks. Encouraged to abstain from cocaine.     4. Tobacco Abuse Discussed cessation. No change.  5. Social  HFSW and Paramedicine saw today.   Follow up in 2 weeks. BMEt today.

## 2018-10-17 NOTE — Progress Notes (Signed)
Paramedicine CSW Initial Assessment  Tyler Wong is enrolled in the [employer] Paramedicine Program through [facility].  The patient presents today in association with an [facility] Appointment.  Patient seen today by CSW for follow up/assistance on Financial difficulties Health problems Substance abuse and/or Food, Financial trader.   Social Determinants impacting successful heart failure regimen:  Housing: patient lives with S.O. Food: Do you have enough food? No  Patient states he struggles with food insecurity and reports $192 in food stamps.  Do you know and understand healthy eating and how that affects your heart failure diagnosis? new to paramedicine and open to learning. Do you follow a low salt diet?  Yes  Utilities: Do you have gas and/or electricity on in your home? Yes  Income: What is your source of income? No income at the moment Insurance: Pt thinks he may have a pending medicaid. Transportation: Do you have transportation to your medical appointments?Yes  If yes, how? He states that family can bring him to the appointments but sometimes will need transport home after the appointment.     Daily Health Needs: Do you have a scale and weigh each day?  Yes  Are you able to adhere to your medication regimen? Yes  Do you ever take medications differently than prescribed? No  Do you know the zones of Heart Failure?  Yes  Do you know how to contact the HF Clinic appropriately with worsening symptoms or weight increases?  Yes    Do you have any identified obstacles / challenges for adherence to current treatment plan?Yes  Currently income and insurance   Patient admitted to substance and tobacco use although states not interested in any treatment at the moment. Patient provided some resources to assist in the event he decides he is ready to quit. Patient aware of community resources for food, transportation and will be  followed by [employer] for continued assessment of needs and interventions.   CSW assisted patient with [employer], Problem-solving teaching/coping strategies, Supportive Counseling and Patient declines referrals for substance/tobacco at this time.  The goal will be to Increase healthy adjustment to current life circumstances and Increase motivation to adhere to plan of care  [facility] Par[facility] Program and [facility] will continue to coordinate and monitor patient's treatment plan. CSW continues to be available for identified needs. Raquel Sarna, [place], [place]

## 2018-10-17 NOTE — Progress Notes (Signed)
Paramedicine Encounter   Patient ID: Tyler Wong , male,   DOB: 11/02/1966,51 y.o.,  MRN: [id number]   Met patient in clinic today with provider.  Time spent with patient 75mn   First visit and meeting with the pt, pt states he gets sob upon exertion, normal walking is ok. Pt denies any dizziness.  Pt lives with "baby mama" at the moment.  Pt reports very little alcohol use, if any one drink every now and then. He states last cocaine use was approx 1 month ago.  No cigarette use since last Wednesday. Usually goes through a pack every 3 days. Walking up set of stairs is difficult-he has to take breaks.  Unable to sleep lying flat because it feels uncomfortable.  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  He [facility].  Pt denies missing any doses of meds.  Pt states he has hard time with sodium intake. He feels it affects him very easily now. He gets it around his abd area.  He is using [place] med assist program.  Transportation is either daughter or his roommate.  SArlyce Harmanincreased to full tab today.  He does get food stamps.  Shows medicaid potential--? verify  B/p-118/68 P-68 Weight [facility] [facility] [facility]   ACTION: Home visit completed

## 2018-10-19 ENCOUNTER — Telehealth (HOSPITAL_COMMUNITY): Payer: Self-pay | Admitting: Licensed Clinical Social Worker

## 2018-10-19 NOTE — Telephone Encounter (Signed)
CSW called pt to follow up regarding Medicaid- pt had thought he had pending application.  Patient states that he has never submitted an application for Medicaid or disability but had discussed it with financial counseling at hospital- they have not started anything yet but are monitoring his chart to see if he qualifies for disability.  Pt states he is familiar with SS office and DHHS and understands that no one has started an application for him at this time but could initiate one for himself.  CSW will continue to follow in clinic and assist as needed  [name], [facility] [phone]

## 2018-10-24 ENCOUNTER — Telehealth (HOSPITAL_COMMUNITY): Payer: Self-pay

## 2018-10-24 NOTE — Telephone Encounter (Signed)
Pt called me this morning to cancel our appointment for this morning, he advised he was not at home but wished to resch for wednesday am around 11.   Marylouise Stacks, EMT-Paramedic  10/24/18

## 2018-10-26 ENCOUNTER — Other Ambulatory Visit (HOSPITAL_COMMUNITY): Payer: Self-pay

## 2018-10-26 NOTE — Progress Notes (Signed)
Paramedicine Encounter    Patient ID: Tyler Wong, male    DOB: Aug 18, 1966, 52 y.o.   MRN: [id number]   Patient Care Team: Patient, No Pcp Per as PCP - General (General Practice)  Patient Active Problem List   Diagnosis Date Noted  . Acute combined systolic and diastolic congestive heart failure ([place]) 10/02/2018  . Hepatitis C 04/13/2017  . Current non-adherence to medical treatment 04/13/2017  . Acute on chronic combined systolic (congestive) and diastolic (congestive) heart failure ([place]) 04/13/2017  . HTN (hypertension) 04/12/2017  . Insomnia 01/18/2017  . Chronic combined systolic and diastolic congestive heart failure ([place]) 01/04/2017  . Hyperlipidemia 01/04/2017  . Chest pain 12/30/2016  . CAD S/P percutaneous coronary angioplasty 07/18/2014  . Tobacco abuse 07/17/2014  . Cocaine use 07/17/2014    Current Outpatient Medications:  .  albuterol (PROVENTIL HFA;VENTOLIN HFA) 108 (90 Base) MCG/ACT inhaler, Inhale 2 puffs into the lungs every 4 (four) hours as needed for wheezing or shortness of breath., Disp: 1 Inhaler, Rfl: 2 .  aspirin 81 MG EC tablet, Take 1 tablet (81 mg total) by mouth daily., Disp: 90 tablet, Rfl: 3 .  atorvastatin (LIPITOR) 80 MG tablet, Take 1 tablet (80 mg total) by mouth daily., Disp: 90 tablet, Rfl: 0 .  carvedilol (COREG) 3.125 MG tablet, Take 1 tablet (3.125 mg total) by mouth 2 (two) times daily with a meal., Disp: 180 tablet, Rfl: 0 .  furosemide (LASIX) 40 MG tablet, Take 1 tablet (40 mg total) by mouth 2 (two) times daily., Disp: 180 tablet, Rfl: 0 .  Multiple Vitamin (MULTIVITAMIN) tablet, Take 1 tablet by mouth daily., Disp: , Rfl:  .  sacubitril-valsartan (ENTRESTO) 24-26 MG, Take 1 tablet by mouth 2 (two) times daily., Disp: 60 tablet, Rfl: 0 .  spironolactone (ALDACTONE) 25 MG tablet, Take 1 tablet (25 mg total) by mouth daily., Disp: 90 tablet, Rfl: 3 .  NITROSTAT 0.4 MG SL tablet, PLACE 1 TABLET UNDER TONGUE AS NEEDED FOR CHEST PAIN EVERY 5  MINUTES X 3 MAX DOSES.  CALL 911 IF PAIN PERSISTS (Patient not taking: No sig reported), Disp: 25 tablet, Rfl: 3 No Known Allergies    Social History   Socioeconomic History  . Marital status: Single    Spouse name: Not on file  . Number of children: 2  . Years of education: 8  . Highest education level: 12th grade  Occupational History  . Occupation: Landscaping  Social Needs  . Financial resource strain: Very hard  . Food insecurity:    Worry: Often true    Inability: Often true  . Transportation needs:    Medical: Yes    Non-medical: Yes  Tobacco Use  . Smoking status: Current Every Day Smoker    Packs/day: 0.50    Years: 30.00    Pack years: 15.00    Types: Cigarettes  . Smokeless tobacco: Never Used  . Tobacco comment: .5 PK PER DAY  Substance and Sexual Activity  . Alcohol use: No    Comment: Patient denies abuse, states social drinker  . Drug use: Yes    Types: Cocaine    Comment: heroin  . Sexual activity: Yes  Lifestyle  . Physical activity:    Days per week: Not on file    Minutes per session: Not on file  . Stress: Not on file  Relationships  . Social connections:    Talks on phone: Not on file    Gets together: Not on file  Attends religious service: Not on file    Active member of club or organization: Not on file    Attends meetings of clubs or organizations: Not on file    Relationship status: Not on file  . Intimate partner violence:    Fear of current or ex partner: Not on file    Emotionally abused: Not on file    Physically abused: Not on file    Forced sexual activity: Not on file  Other Topics Concern  . Not on file  Social History Narrative   Lives in [place] with his sister and girlfriend. He works Aeronautical engineer work.     Physical Exam      Future Appointments  Date Time Provider [place]  10/31/2018  8:30 AM [facility] PA/NP [facility] None  11/17/2018  9:50 AM Fulp, Cammie, MD [facility] None     There were no vitals taken for this visit.  Weight yesterday-164  Last visit weight-164 @ clinic   1st meeting with pt in the home, he lives with ex-wfie, "baby mama".  Pt states his breathing is doing ok, verified meds, he would like to use pill box, he is comfortable taking his pills out of bottle, however just to save time to use pill box. I filled it for him today and explained how to use pill box which he was familiar.  His spiro is not able to be filled by med assist pharmacy, erika will send rx to outpt pharmacy. He also has not been taking entresto BID, most of time he does do that but sometimes He is not weighing daily, he said he forgets sometimes and takes it once. He takes meds at various times of day depending on his sleep schedule, so I asked him to just pick a time he knows he is always awake to take meds same time daily.  Pt denies any dizziness, pt states he fell a few days ago helping someone cross the road and he injured his left hand, he does have swelling and tenderness to the left hand-advised him to ice it and if it doesn't improve he needs to contact his PCP for further care.  Pt denies c/p, he does get sob when he walks the stairs in his house.  Pt reports he is on low sodium foods, pt states he is keeping his fluid intake down to less than 2L.  Pt states he is smoking cigarettes when he can, hasnt smoked in a few days just because he hasnt been able to get any, but denies any drinking etoh or use of drugs.  He is not ready nor interested to quit.  Advised him of the mens support group meetings and invited him to that, he seemed semi-interested--transportation is an issue for him.    Marylouise Stacks, [facility] Paramedic  10/26/18

## 2018-10-31 ENCOUNTER — Inpatient Hospital Stay (HOSPITAL_COMMUNITY)
Admission: RE | Admit: 2018-10-31 | Discharge: 2018-10-31 | Disposition: A | Discharge status: Home / Self Care | Payer: Self-pay | Source: Ambulatory Visit

## 2018-11-10 ENCOUNTER — Inpatient Hospital Stay (HOSPITAL_COMMUNITY)
Admission: RE | Admit: 2018-11-10 | Discharge: 2018-11-10 | Disposition: A | Discharge status: Home / Self Care | Payer: Self-pay | Source: Ambulatory Visit

## 2018-11-14 ENCOUNTER — Telehealth (HOSPITAL_COMMUNITY): Payer: Self-pay

## 2018-11-14 NOTE — Telephone Encounter (Signed)
Called pt today to f/u on his missed clinic appointment last week. No answer, left VM to return my call.   Marylouise Stacks, EMT-Paramedic  11/14/18

## 2018-11-17 ENCOUNTER — Ambulatory Visit: Payer: Self-pay | Admitting: Family Medicine

## 2018-11-21 ENCOUNTER — Telehealth (HOSPITAL_COMMUNITY): Payer: Self-pay

## 2018-11-21 NOTE — Telephone Encounter (Signed)
Called pt regarding home visit, phone went straight to vm, I left my name and number for him to return my call.   Marylouise Stacks, EMT-Paramedic  11/21/18

## 2018-11-23 ENCOUNTER — Telehealth (HOSPITAL_COMMUNITY): Payer: Self-pay | Admitting: Surgery

## 2018-11-23 NOTE — Telephone Encounter (Signed)
Patient will be discharged at this time from [place] program at this time.  He has been unavailable and not answering calls to schedule home visits.

## 2019-02-22 ENCOUNTER — Emergency Department (HOSPITAL_COMMUNITY)
Admission: EM | Admit: 2019-02-22 | Discharge: 2019-02-22 | Disposition: A | Discharge status: Home / Self Care | Payer: Medicaid Other | Attending: Emergency Medicine | Admitting: Emergency Medicine

## 2019-02-22 ENCOUNTER — Encounter (HOSPITAL_COMMUNITY): Payer: Self-pay | Admitting: Emergency Medicine

## 2019-02-22 ENCOUNTER — Other Ambulatory Visit: Payer: Self-pay

## 2019-02-22 ENCOUNTER — Emergency Department (HOSPITAL_COMMUNITY): Payer: Medicaid Other

## 2019-02-22 DIAGNOSIS — I509 Heart failure, unspecified: Secondary | ICD-10-CM | POA: Diagnosis not present

## 2019-02-22 DIAGNOSIS — F17210 Nicotine dependence, cigarettes, uncomplicated: Secondary | ICD-10-CM | POA: Diagnosis not present

## 2019-02-22 DIAGNOSIS — I110 Hypertensive heart disease with heart failure: Secondary | ICD-10-CM | POA: Insufficient documentation

## 2019-02-22 DIAGNOSIS — R0602 Shortness of breath: Secondary | ICD-10-CM

## 2019-02-22 DIAGNOSIS — J449 Chronic obstructive pulmonary disease, unspecified: Secondary | ICD-10-CM | POA: Insufficient documentation

## 2019-02-22 DIAGNOSIS — B349 Viral infection, unspecified: Secondary | ICD-10-CM | POA: Diagnosis not present

## 2019-02-22 LAB — CBC
HCT: 43.6 % (ref 39.0–52.0)
Hemoglobin: 13.9 g/dL (ref 13.0–17.0)
MCH: 27.8 pg (ref 26.0–34.0)
MCHC: 31.9 g/dL (ref 30.0–36.0)
MCV: 87.2 fL (ref 80.0–100.0)
Platelets: 212 10*3/uL (ref 150–400)
RBC: 5 MIL/uL (ref 4.22–5.81)
RDW: 15.2 % (ref 11.5–15.5)
WBC: 6.7 10*3/uL (ref 4.0–10.5)
nRBC: 0 % (ref 0.0–0.2)

## 2019-02-22 LAB — BASIC METABOLIC PANEL
Anion gap: 8 (ref 5–15)
BUN: 16 mg/dL (ref 6–20)
CO2: 23 mmol/L (ref 22–32)
Calcium: 8.3 mg/dL — ABNORMAL LOW (ref 8.9–10.3)
Chloride: 104 mmol/L (ref 98–111)
Creatinine, Ser: 1.31 mg/dL — ABNORMAL HIGH (ref 0.61–1.24)
GFR calc Af Amer: >60 mL/min (ref >60)
GFR calc non Af Amer: >60 mL/min (ref >60)
Glucose, Bld: 115 mg/dL — ABNORMAL HIGH (ref 70–99)
Potassium: 4 mmol/L (ref 3.5–5.1)
Sodium: 135 mmol/L (ref 135–145)

## 2019-02-22 LAB — INFLUENZA PANEL BY PCR (TYPE A & B)
Influenza A By PCR: NEGATIVE
Influenza B By PCR: NEGATIVE

## 2019-02-22 LAB — LACTIC ACID, PLASMA: Lactic Acid, Venous: 1.3 mmol/L (ref 0.5–1.9)

## 2019-02-22 LAB — I-STAT TROPONIN, ED: Troponin i, poc: 0.01 ng/mL (ref 0.00–0.08)

## 2019-02-22 LAB — BRAIN NATRIURETIC PEPTIDE: B Natriuretic Peptide: 757.7 pg/mL — ABNORMAL HIGH (ref 0.0–100.0)

## 2019-02-22 MED ORDER — FUROSEMIDE 40 MG PO TABS: 40.0000 mg | ORAL_TABLET | Freq: Two times a day (BID) | ORAL | 2 refills | Status: DC

## 2019-02-22 MED ORDER — IPRATROPIUM-ALBUTEROL 0.5-2.5 (3) MG/3ML IN SOLN
3.0000 mL | Freq: Once | RESPIRATORY_TRACT | Status: AC
  Administered 2019-02-22: 3 mL via RESPIRATORY_TRACT
  Filled 2019-02-22: qty 3

## 2019-02-22 MED ORDER — PREDNISONE 20 MG PO TABS: 40.0000 mg | ORAL_TABLET | Freq: Every day | ORAL | 0 refills | Status: AC

## 2019-02-22 MED ORDER — SODIUM CHLORIDE 0.9 % IV BOLUS
500.0000 mL | Freq: Once | INTRAVENOUS | Status: AC
  Administered 2019-02-22: 500 mL via INTRAVENOUS

## 2019-02-22 MED ORDER — METHYLPREDNISOLONE SODIUM SUCC 125 MG IJ SOLR
125.0000 mg | Freq: Once | INTRAMUSCULAR | Status: AC
  Administered 2019-02-22: 125 mg via INTRAVENOUS
  Filled 2019-02-22: qty 2

## 2019-02-22 MED FILL — FUROSEMIDE 40 MG TAB: 40 | 15 days supply | Qty: 30 | Fill #0 | Status: TO

## 2019-02-22 MED FILL — predniSONE 20 MG TABS: 20 | 4 days supply | Qty: 8 | Fill #0

## 2019-02-22 NOTE — Discharge Instructions (Addendum)
You were evaluated in the Emergency Department and after careful evaluation, we did not find any emergent condition requiring admission or further testing in the hospital.  Your symptoms today seem to be due to a viral illness.  Please take the steroids and lasix as directed.  Please return to the Emergency Department if you experience any worsening of your condition.  We encourage you to follow up with a primary care provider.  Thank you for allowing [place] to be a part of your care.

## 2019-02-22 NOTE — ED Provider Notes (Signed)
[facility] Emergency Department Provider Note MRN:  [id number]  Arrival date & time: 02/22/19     Chief Complaint   Shortness of Breath   History of Present Illness   Tyler Wong is a 53 y.o. year-old male with a history of tobacco abuse, CAD, CHF presenting to the ED with chief complaint of shortness of breath.  4 days of persistent cough, originally dry, becoming more productive.  Developing shortness of breath, pain in the center of the chest that is worse when coughing, worse when taking deep breaths.  Endorsing nasal congestion, diffuse body aches, denies abdominal pain, no numbness weakness to the arms or legs.  Symptoms are constant, no exacerbating relieving factors.  Review of Systems  A complete 10 system review of systems was obtained and all systems are negative except as noted in the HPI and PMH.   Patient's Health History    Past Medical History:  Diagnosis Date  . CAD in native artery 07/17/14   STEMI- LAD stenosis with DES  . CHF (congestive heart failure) ([place])   . Cocaine abuse ([place])   . COPD (chronic obstructive pulmonary disease) ([place])   . Dyspnea   . Hypertension   . ST elevation myocardial infarction (STEMI) involving left anterior descending (LAD) coronary artery with complication ([place])   . Tobacco abuse 07/17/2014    Past Surgical History:  Procedure Laterality Date  . CORONARY ANGIOPLASTY WITH STENT PLACEMENT  07/18/14   resolute DES to LAD STEMI  . LEFT HEART CATH N/A 07/17/2014   Procedure: LEFT HEART CATH;  Surgeon: [name], MD;  Location: [facility] CATH LAB;  Service: Cardiovascular;  Laterality: N/A;  . PERCUTANEOUS CORONARY STENT INTERVENTION (PCI-S)  07/17/2014   Procedure: PERCUTANEOUS CORONARY STENT INTERVENTION (PCI-S);  Surgeon: [name], MD;  Location: [facility] CATH LAB;  Service: Cardiovascular;;  . RIGHT/LEFT HEART CATH AND CORONARY ANGIOGRAPHY N/A 10/03/2018   Procedure: RIGHT/LEFT HEART CATH AND CORONARY ANGIOGRAPHY;   Surgeon: [name], MD;  Location: [facility] CV LAB;  Service: Cardiovascular;  Laterality: N/A;    Family History  Problem Relation Age of Onset  . Cirrhosis Father   . Heart attack Mother   . Heart attack Sister   . Heart failure Sister   . Heart attack Brother     Social History   Socioeconomic History  . Marital status: Single    Spouse name: Not on file  . Number of children: 2  . Years of education: 73  . Highest education level: 12th grade  Occupational History  . Occupation: Landscaping  Social Needs  . Financial resource strain: Very hard  . Food insecurity:    Worry: Often true    Inability: Often true  . Transportation needs:    Medical: Yes    Non-medical: Yes  Tobacco Use  . Smoking status: Current Every Day Smoker    Packs/day: 0.50    Years: 30.00    Pack years: 15.00    Types: Cigarettes  . Smokeless tobacco: Never Used  . Tobacco comment: .5 PK PER DAY  Substance and Sexual Activity  . Alcohol use: No    Comment: Patient denies abuse, states social drinker  . Drug use: Yes    Types: Cocaine    Comment: heroin  . Sexual activity: Yes  Lifestyle  . Physical activity:    Days per week: Not on file    Minutes per session: Not on file  . Stress: Not on file  Relationships  . Social connections:    Talks on phone: Not on file    Gets together: Not on file    Attends religious service: Not on file    Active member of club or organization: Not on file    Attends meetings of clubs or organizations: Not on file    Relationship status: Not on file  . Intimate partner violence:    Fear of current or ex partner: Not on file    Emotionally abused: Not on file    Physically abused: Not on file    Forced sexual activity: Not on file  Other Topics Concern  . Not on file  Social History Narrative   Lives in [place] with his sister and girlfriend. He works Aeronautical engineer work.      Physical Exam  Vital Signs and Nursing Notes  reviewed Vitals:   02/22/19 0900 02/22/19 0930  BP: (!) 127/93 123/89  Pulse: 97 98  Resp: (!) 24 (!) 26  Temp:    SpO2: 96% 96%    CONSTITUTIONAL: Well-appearing, NAD NEURO:  Alert and oriented x 3, no focal deficits EYES:  eyes equal and reactive ENT/NECK:  no LAD, no JVD CARDIO: Tachycardic rate, well-perfused, normal S1 and S2 PULM: Faint scattered wheezes, tachypneic with shallow breaths GI/GU:  normal bowel sounds, non-distended, non-tender MSK/SPINE:  No gross deformities, no edema SKIN:  no rash, atraumatic PSYCH:  Appropriate speech and behavior  Diagnostic and Interventional Summary    EKG Interpretation  Date/Time:  Wednesday February 22 2019 07:39:04 EST Ventricular Rate:  102 PR Interval:    QRS Duration: 91 QT Interval:  377 QTC Calculation: 492 R Axis:   -70 Text Interpretation:  Sinus tachycardia Biatrial enlargement Left anterior fascicular block Left ventricular hypertrophy Anterolateral infarct, age indeterminate Baseline wander in lead(s) V2 Confirmed by Gerlene Fee [phone]) on 02/22/2019 8:07:52 AM      Labs Reviewed  BASIC METABOLIC PANEL - Abnormal; Notable for the following components:      Result Value   Glucose, Bld 115 (*)    Creatinine, Ser 1.31 (*)    Calcium 8.3 (*)    All other components within normal limits  BRAIN NATRIURETIC PEPTIDE - Abnormal; Notable for the following components:   B Natriuretic Peptide 757.7 (*)    All other components within normal limits  CULTURE, BLOOD (SINGLE)  CBC  LACTIC ACID, PLASMA  INFLUENZA PANEL BY PCR (TYPE A & B)  I-STAT TROPONIN, ED    DG Chest 2 View  Final Result      Medications  sodium chloride 0.9 % bolus 500 mL (0 mLs Intravenous Stopped 02/22/19 0921)  ipratropium-albuterol (DUONEB) 0.5-2.5 (3) MG/3ML nebulizer solution 3 mL (3 mLs Nebulization Given 02/22/19 0726)  ipratropium-albuterol (DUONEB) 0.5-2.5 (3) MG/3ML nebulizer solution 3 mL (3 mLs Nebulization Given 02/22/19 0726)    methylPREDNISolone sodium succinate (SOLU-MEDROL) 125 mg/2 mL injection 125 mg (125 mg Intravenous Given 02/22/19 5573)     Procedures Critical Care  ED Course and Medical Decision Making  I have reviewed the triage vital signs and the nursing notes.  Pertinent labs & imaging results that were available during my care of the patient were reviewed by me and considered in my medical decision making (see below for details).  Favoring viral illness, likely influenza in this 53 year old male with history of CAD, CHF.  No peripheral edema or JVD, chest pain is atypical and felt to be more likely related to chest wall pain related  to coughing.  No evidence of DVT, little to no concern for PE.  Patient also has likely underlying COPD given his smoking history, will provide duo nebs, steroids.  Pneumonia also possibility, chest x-ray pending.  Work-up is unrevealing, troponin negative, chest x-ray unremarkable, influenza negative.  Suspecting viral illness triggering reactive airway disease, possibly COPD given his tobacco use history.  Possibly multifactorial given his elevated BNP, patient explains he ran out of his Lasix 2 days ago, will represcribed.  Will also provide steroid burst, strict return precautions.  After the discussed management above, the patient was determined to be safe for discharge.  The patient was in agreement with this plan and all questions regarding their care were answered.  ED return precautions were discussed and the patient will return to the ED with any significant worsening of condition.  Barth Kirks. Sedonia Small, MD [place] [email or web address]  Final Clinical Impressions(s) / ED Diagnoses     ICD-10-CM   1. Viral illness B34.9   2. SOB (shortness of breath) R06.02 DG Chest 2 View    DG Chest 2 View    ED Discharge Orders         Ordered    predniSONE (DELTASONE) 20 MG tablet  Daily     02/22/19 0954    furosemide  (LASIX) 40 MG tablet  2 times daily     02/22/19 0954             Maudie Flakes, MD 02/22/19 1530

## 2019-02-22 NOTE — ED Notes (Signed)
Patient transported to X-ray 

## 2019-02-22 NOTE — ED Triage Notes (Signed)
Patient c/o shortness of breath and productive cough for 4 days. Patient had one episode of emesis this morning. Patient does smoke but unable to recently due to difficulty breathing.

## 2019-02-27 LAB — CULTURE, BLOOD (SINGLE): Culture: NO GROWTH

## 2019-02-27 NOTE — Progress Notes (Signed)
PCP: None  Primary Cardiologist: Dr Aundra Dubin   HPI: Tyler Wong is a 53 y.o. male  with history of chronic systolic heart failure, CAD MI 2015 with DES to LAD , HTN, hyperlipidemia, and cocaine abuse.He has been using cocaine for 30 years. Typically uses cocaine once a month. Drinks 1 beer a week. Smokes 1 PPD every 3 days. Runs out medications every month because he cant get refills..  Admitted 10/02/18 with increased shortness of breath and chest pain. Underwent RHC/LHC with  significant CAD but no target. Possible that low EF is due to cocaine abuse. HF medications started. He was given 30 day supply of HF medications prior to discharge. He was referred to paramedicine.   He presents today for regular follow up. Last seen 09/2018. Has been non-compliant with follow up. He is feeling OK, but getting over a URI for which he was seen in the ED last week. Paramedicine here today to re-establish. He has continued to get his medications filled through [facility], all but spironolactone. He has SOB with stairs, but denies with ADLs. He denies PND or Orthopnea. Appetite stable. No fevers or chills in setting of cough. Having RUQ pain with coughing, that has only started since his URI. He still does not have insurance. SW to see today.   Echo 10/02/2018  RV moderately dilated. RA severly dilated.  . LV EF20-25% severely reduced with ventricular dilation. Wall motion appears diffusely worse at apex, involving dyskinesis of inferior apex and severe hypokinesis/akinesis of the anterior, anterolateral, inferolateral, and inferoseptal apex. 2. At least moderate MR due to functional noncoaptation of the leaflets. 3. Moderate-severe TR with elevated   R/LHC 10/03/18: RHC Procedural Findings: Hemodynamics (mmHg) RA mean 7 RV 55/9 PA 57/27, mean 39 PCWP mean 20 LV 95/26 AO 95/69 Oxygen saturations: PA 65% AO 93% Cardiac Output (Fick) 5.09  Cardiac Index (Fick) 2.59 PVR 3.7  WU  Left Main  Short, no significant disease.  Left Anterior Descending  Patent proximal LAD stent. 30% proximal LAD stenosis just proximal to stent.  Ramus Intermedius  Luminal irregularities with moderate diffuse disease in the distal branches.  Left Circumflex  Dominant vessel providing left PDA. Luminal irregularities in the LCx itself. Moderate OM1 with 90% ostial stenosis.  Right Coronary Artery  Nondominant RCA. It does provide a moderate PLV branch with 70% mid-vessel stenosis.   1. Elevated left heart filling pressure (PCWP and LVEDP).  2. Preserved cardiac output.  3. Primarily pulmonary venous hypertension.  4. Nondominant RCA providing a PLV with 70% stenosis. 90% ostial moderate OM1. Will plan medical management of coronary disease. No chest pain and does not explain fall in EF.   Review of systems complete and found to be negative unless listed in HPI.    SH:  Social History   Socioeconomic History  . Marital status: Single    Spouse name: Not on file  . Number of children: 2  . Years of education: 35  . Highest education level: 12th grade  Occupational History  . Occupation: Landscaping  Social Needs  . Financial resource strain: Very hard  . Food insecurity:    Worry: Often true    Inability: Often true  . Transportation needs:    Medical: Yes    Non-medical: Yes  Tobacco Use  . Smoking status: Current Every Day Smoker    Packs/day: 0.50    Years: 30.00    Pack years: 15.00    Types: Cigarettes  . Smokeless tobacco: Never  Used  . Tobacco comment: .5 PK PER DAY  Substance and Sexual Activity  . Alcohol use: No    Comment: Patient denies abuse, states social drinker  . Drug use: Yes    Types: Cocaine    Comment: heroin  . Sexual activity: Yes  Lifestyle  . Physical activity:    Days per week: Not on file    Minutes per session: Not on file  . Stress: Not on file  Relationships  . Social connections:    Talks on phone: Not on file     Gets together: Not on file    Attends religious service: Not on file    Active member of club or organization: Not on file    Attends meetings of clubs or organizations: Not on file    Relationship status: Not on file  . Intimate partner violence:    Fear of current or ex partner: Not on file    Emotionally abused: Not on file    Physically abused: Not on file    Forced sexual activity: Not on file  Other Topics Concern  . Not on file  Social History Narrative   Lives in [place] with his sister and girlfriend. He works Aeronautical engineer work.     FH:  Family History  Problem Relation Age of Onset  . Cirrhosis Father   . Heart attack Mother   . Heart attack Sister   . Heart failure Sister   . Heart attack Brother     Past Medical History:  Diagnosis Date  . CAD in native artery 07/17/14   STEMI- LAD stenosis with DES  . CHF (congestive heart failure) ([place])   . Cocaine abuse ([place])   . COPD (chronic obstructive pulmonary disease) ([place])   . Dyspnea   . Hypertension   . ST elevation myocardial infarction (STEMI) involving left anterior descending (LAD) coronary artery with complication ([place])   . Tobacco abuse 07/17/2014    Current Outpatient Medications  Medication Sig Dispense Refill  . albuterol (PROVENTIL HFA;VENTOLIN HFA) 108 (90 Base) MCG/ACT inhaler Inhale 2 puffs into the lungs every 4 (four) hours as needed for wheezing or shortness of breath. 1 Inhaler 2  . aspirin 81 MG EC tablet Take 1 tablet (81 mg total) by mouth daily. 90 tablet 3  . atorvastatin (LIPITOR) 80 MG tablet Take 1 tablet (80 mg total) by mouth daily. 90 tablet 0  . carvedilol (COREG) 3.125 MG tablet Take 1 tablet (3.125 mg total) by mouth 2 (two) times daily with a meal. 180 tablet 0  . furosemide (LASIX) 40 MG tablet Take 1 tablet (40 mg total) by mouth 2 (two) times daily. 30 tablet 2  . Multiple Vitamin (MULTIVITAMIN) tablet Take 1 tablet by mouth daily.    Marland Kitchen NITROSTAT 0.4 MG SL tablet  PLACE 1 TABLET UNDER TONGUE AS NEEDED FOR CHEST PAIN EVERY 5 MINUTES X 3 MAX DOSES.  CALL 911 IF PAIN PERSISTS (Patient taking differently: Place 0.4 mg under the tongue every 5 (five) minutes as needed for chest pain. ) 25 tablet 3  . sacubitril-valsartan (ENTRESTO) 24-26 MG Take 1 tablet by mouth 2 (two) times daily. 60 tablet 0  . spironolactone (ALDACTONE) 25 MG tablet Take 1 tablet (25 mg total) by mouth daily. (Patient not taking: Reported on 02/28/2019) 90 tablet 3   No current facility-administered medications for this encounter.    Vitals:   02/28/19 0819  BP: 108/78  Pulse: 88  SpO2: 96%  Weight: 79.9 kg (176 lb 3.2 oz)     Wt Readings from Last 3 Encounters:  02/28/19 79.9 kg (176 lb 3.2 oz)  02/22/19 79.8 kg (176 lb)  10/17/18 74.8 kg (164 lb 12.8 oz)   PHYSICAL EXAM: General: Well appearing. No resp difficulty. HEENT: Normal Neck: Supple. JVP 5-6. Carotids 2+ bilat; no bruits. No thyromegaly or nodule noted. Cor: PMI nondisplaced. RRR, No M/G/R noted Lungs: CTAB, normal effort. Abdomen: Soft, non-tender, non-distended, no HSM. No bruits or masses. +BS  Extremities: No cyanosis, clubbing, or rash. R and LLE no edema.  Neuro: Alert & orientedx3, cranial nerves grossly intact. moves all 4 extremities w/o difficulty. Affect pleasant   ASSESSMENT & PLAN: 1. Chronic Systolic Heart Failure due to NICM In 2015 EF 40-45% and now down to 20-25%. Likely ICM but also could be familial with sister and mom having heart failure - R/LHC 10/03/18 with CAD, but does not explain drop in EF.  - NYHA III symptoms. - Volume status stable on exam.   - Continue lasix 40 mg twice a day.  - Restart spironolactone at 12.5 mg daily  - Continue carvedilol 3.125 mg BID - Continue entresto 24-26 mg BID - Plan to repeat Echo once HF meds optimized if compliant. Would plan repeat this summer.   2. CAD  - Had [place] 2015 with DES to LAD - As above, LHC showed CAD, but did not explain decreased in  EF - Continue lipitor and 81 mg aspirin - No s/s of ischemia.     3.Cocaine Abuse - He states he has not used drugs for quite some time.   4. Tobacco Abuse - Encouraged complete cessation.   5. Social  - Re-establishing with paramedicine.  - HFSW to see today.   He is doing well overall after long period of non-compliance, states he was out of town. Paramedicine and SW to see today. Meds as above above. Repeat labs 10 days with spiro. RTC 4 weeks for continued med titration, then set up for echo with MD.   Shirley Friar, PA-C  02/28/19   Greater than 50% of the 25 minute visit was spent in counseling/coordination of care regarding disease state education, salt/fluid restriction, sliding scale diuretics, and medication compliance.

## 2019-02-28 ENCOUNTER — Other Ambulatory Visit: Payer: Self-pay | Admitting: Physician Assistant

## 2019-02-28 ENCOUNTER — Ambulatory Visit (HOSPITAL_COMMUNITY)
Admission: RE | Admit: 2019-02-28 | Discharge: 2019-02-28 | Disposition: A | Discharge status: Home / Self Care | Payer: Medicaid Other | Source: Ambulatory Visit | Attending: Cardiology | Admitting: Cardiology

## 2019-02-28 ENCOUNTER — Encounter (HOSPITAL_COMMUNITY): Payer: Self-pay

## 2019-02-28 ENCOUNTER — Other Ambulatory Visit (HOSPITAL_COMMUNITY): Payer: Self-pay

## 2019-02-28 ENCOUNTER — Other Ambulatory Visit: Payer: Self-pay

## 2019-02-28 VITALS — BP 108/78 | HR 88 | Wt 176.2 lb

## 2019-02-28 DIAGNOSIS — I5042 Chronic combined systolic (congestive) and diastolic (congestive) heart failure: Secondary | ICD-10-CM

## 2019-02-28 DIAGNOSIS — F17210 Nicotine dependence, cigarettes, uncomplicated: Secondary | ICD-10-CM | POA: Diagnosis not present

## 2019-02-28 DIAGNOSIS — Z9119 Patient's noncompliance with other medical treatment and regimen: Secondary | ICD-10-CM | POA: Diagnosis not present

## 2019-02-28 DIAGNOSIS — Z7982 Long term (current) use of aspirin: Secondary | ICD-10-CM | POA: Diagnosis not present

## 2019-02-28 DIAGNOSIS — I2510 Atherosclerotic heart disease of native coronary artery without angina pectoris: Secondary | ICD-10-CM | POA: Diagnosis not present

## 2019-02-28 DIAGNOSIS — I110 Hypertensive heart disease with heart failure: Secondary | ICD-10-CM | POA: Diagnosis not present

## 2019-02-28 DIAGNOSIS — Z720 Tobacco use: Secondary | ICD-10-CM

## 2019-02-28 DIAGNOSIS — I428 Other cardiomyopathies: Secondary | ICD-10-CM | POA: Insufficient documentation

## 2019-02-28 DIAGNOSIS — J449 Chronic obstructive pulmonary disease, unspecified: Secondary | ICD-10-CM | POA: Diagnosis not present

## 2019-02-28 DIAGNOSIS — E785 Hyperlipidemia, unspecified: Secondary | ICD-10-CM | POA: Diagnosis not present

## 2019-02-28 DIAGNOSIS — F1410 Cocaine abuse, uncomplicated: Secondary | ICD-10-CM

## 2019-02-28 DIAGNOSIS — I255 Ischemic cardiomyopathy: Secondary | ICD-10-CM

## 2019-02-28 DIAGNOSIS — Z8249 Family history of ischemic heart disease and other diseases of the circulatory system: Secondary | ICD-10-CM | POA: Insufficient documentation

## 2019-02-28 DIAGNOSIS — I252 Old myocardial infarction: Secondary | ICD-10-CM | POA: Insufficient documentation

## 2019-02-28 DIAGNOSIS — Z79899 Other long term (current) drug therapy: Secondary | ICD-10-CM | POA: Diagnosis not present

## 2019-02-28 DIAGNOSIS — Z955 Presence of coronary angioplasty implant and graft: Secondary | ICD-10-CM | POA: Diagnosis not present

## 2019-02-28 DIAGNOSIS — I5022 Chronic systolic (congestive) heart failure: Secondary | ICD-10-CM | POA: Diagnosis present

## 2019-02-28 DIAGNOSIS — I10 Essential (primary) hypertension: Secondary | ICD-10-CM

## 2019-02-28 DIAGNOSIS — Z9861 Coronary angioplasty status: Secondary | ICD-10-CM

## 2019-02-28 MED ORDER — SACUBITRIL-VALSARTAN 24-26 MG PO TABS: 1.0000 | ORAL_TABLET | Freq: Two times a day (BID) | ORAL | 0 refills | Status: DC

## 2019-02-28 MED ORDER — SPIRONOLACTONE 25 MG PO TABS: 12.5000 mg | ORAL_TABLET | Freq: Every day | ORAL | 3 refills | Status: DC

## 2019-02-28 NOTE — Progress Notes (Signed)
Paramedicine Encounter   Patient ID: Tyler Wong , male,   DOB: 1966-12-27,52 y.o.,  MRN: [id number]   Met patient in clinic today with provider.   Pt called me the other week asking to restart paramedicine services--I told him to call to make appointment with clinic and I would restart after he is seen.  He did come today and he reports being out of town for past few months and needs assistance with meds.  He has caught a URI, he has had cough.   Smoking-2-3 cigarettes a day No insurance--[facility] med assist for a few meds --spiro added back on --he has been sent paperwork for medicaid but has not started the process yet--[facility] but had missed several appointments with them as well.   He has transportation issues--he can be dropped off for appointments in the morning but usually has to take the bus for trips back.  [name] got his meds worked out--all of his meds except sp[facility]. Arlyce Harman will be at [employer] and he knows where to go and will pick it up.  I will make a home visit next week.   Marylouise Stacks, [place] 02/28/2019

## 2019-02-28 NOTE — Progress Notes (Signed)
CSW consulted to help with medication concerns.  Pt reports he was getting medications through [facility] but is not sure if he is still eligible as you have to reapply every year.  CSW called [facility] who confirms he is eligible until 06/28/2019- CSW requested they fill mediations at this time and sent in [facility] refill request.  Medications not covered by [facility] sent to outpatient pharmacy through [facility]  CSW will continue to follow and assist as needed  [name], [place] Worker [facility] [phone]

## 2019-02-28 NOTE — Progress Notes (Signed)
1 bottle of Entresto 24-26 given. Lot # [id number]. Expiration: 3/22

## 2019-02-28 NOTE — Patient Instructions (Addendum)
Labs will need to be done in 10-14 days  RESTART Spironolactone 12.5mg  (0.5 tab) daily  REFILLED Entresto  Please Follow up with the Advanced Practice Provider in 4 weeks

## 2019-03-09 ENCOUNTER — Telehealth (HOSPITAL_COMMUNITY): Payer: Self-pay

## 2019-03-09 NOTE — Telephone Encounter (Signed)
Called pt to schedule home visit this week however he did not answer, I left VM for him to return my call.   Marylouise Stacks, EMT-Paramedic  03/09/19

## 2019-03-10 ENCOUNTER — Other Ambulatory Visit (HOSPITAL_COMMUNITY): Payer: Self-pay

## 2019-03-13 ENCOUNTER — Telehealth (HOSPITAL_COMMUNITY): Payer: Self-pay

## 2019-03-14 NOTE — Telephone Encounter (Signed)
Called pt to sch home visit, he did not answer, I left a message for him to return my call.   Marylouise Stacks, EMT-Paramedic  03/14/19

## 2019-03-20 ENCOUNTER — Telehealth (HOSPITAL_COMMUNITY): Payer: Self-pay

## 2019-03-20 NOTE — Telephone Encounter (Signed)
Attempted to contact patient again regarding home visit-he did not answer and I left another voicemail asking him to call me back.   Marylouise Stacks, EMT-Paramedic  03/20/19

## 2019-03-27 ENCOUNTER — Telehealth (HOSPITAL_COMMUNITY): Payer: Self-pay

## 2019-03-27 NOTE — Telephone Encounter (Signed)
Due to pt not responding back again in 3 attempts pt is going to be d/c from paramedicine program.   Marylouise Stacks, EMT-Paramedic  03/27/19

## 2019-03-28 ENCOUNTER — Telehealth (HOSPITAL_COMMUNITY): Payer: Self-pay | Admitting: Licensed Clinical Social Worker

## 2019-03-28 ENCOUNTER — Ambulatory Visit (HOSPITAL_COMMUNITY)
Admission: RE | Admit: 2019-03-28 | Discharge: 2019-03-28 | Disposition: A | Discharge status: Home / Self Care | Payer: Self-pay | Source: Ambulatory Visit | Attending: Cardiology | Admitting: Cardiology

## 2019-03-28 ENCOUNTER — Other Ambulatory Visit: Payer: Self-pay

## 2019-03-28 DIAGNOSIS — F17200 Nicotine dependence, unspecified, uncomplicated: Secondary | ICD-10-CM

## 2019-03-28 DIAGNOSIS — F17210 Nicotine dependence, cigarettes, uncomplicated: Secondary | ICD-10-CM

## 2019-03-28 DIAGNOSIS — I2510 Atherosclerotic heart disease of native coronary artery without angina pectoris: Secondary | ICD-10-CM

## 2019-03-28 DIAGNOSIS — I5042 Chronic combined systolic (congestive) and diastolic (congestive) heart failure: Secondary | ICD-10-CM

## 2019-03-28 DIAGNOSIS — Z955 Presence of coronary angioplasty implant and graft: Secondary | ICD-10-CM

## 2019-03-28 DIAGNOSIS — I5022 Chronic systolic (congestive) heart failure: Secondary | ICD-10-CM

## 2019-03-28 DIAGNOSIS — Z9861 Coronary angioplasty status: Secondary | ICD-10-CM

## 2019-03-28 DIAGNOSIS — R0683 Snoring: Secondary | ICD-10-CM

## 2019-03-28 DIAGNOSIS — I428 Other cardiomyopathies: Secondary | ICD-10-CM

## 2019-03-28 DIAGNOSIS — E785 Hyperlipidemia, unspecified: Secondary | ICD-10-CM

## 2019-03-28 DIAGNOSIS — Z79899 Other long term (current) drug therapy: Secondary | ICD-10-CM

## 2019-03-28 MED ORDER — SACUBITRIL-VALSARTAN 24-26 MG PO TABS: 1.0000 | ORAL_TABLET | Freq: Two times a day (BID) | ORAL | 3 refills | Status: DC

## 2019-03-28 MED ORDER — ATORVASTATIN CALCIUM 80 MG PO TABS: 80.0000 mg | ORAL_TABLET | Freq: Every day | ORAL | 3 refills | Status: DC

## 2019-03-28 MED ORDER — SPIRONOLACTONE 25 MG PO TABS: 12.5000 mg | ORAL_TABLET | Freq: Every day | ORAL | 3 refills | Status: DC

## 2019-03-28 MED ORDER — FUROSEMIDE 40 MG PO TABS: 40.0000 mg | ORAL_TABLET | Freq: Two times a day (BID) | ORAL | 3 refills | Status: DC

## 2019-03-28 MED ORDER — CARVEDILOL 3.125 MG PO TABS: 3.1250 mg | ORAL_TABLET | Freq: Two times a day (BID) | ORAL | 3 refills | Status: DC

## 2019-03-28 NOTE — Telephone Encounter (Signed)
CSW consulted to reconsider patient for paramedicine program- pt just discharged due to being nonresponsive to paramedic.  CSW spoke with pt who states he is interested in working with paramedicine program and will be better about responding to paramedic outreach- CSW informed pt he needs to call back paramedic to set up initial visit and if he is able to do this we will re-enroll him in the program- pt expressed understanding.  CSW will continue to follow and assist as needed  [name], [facility] Desk#: [phone] Cell#: [phone]

## 2019-03-28 NOTE — Addendum Note (Signed)
Encounter addended by: Marlise Eves, RN on: 03/28/2019 10:05 AM  Actions taken: Pharmacy for encounter modified, Order list changed, Diagnosis association updated, Clinical Note Signed

## 2019-03-28 NOTE — Patient Instructions (Addendum)
Lab work will need to be done in 2 weeks. This will be Tuesday April 14th at 8:45am. The parking code for April is 8007.  REFILLED all cardiac meds as a 90 day supply to [facility]  RESTART Spironolactone 12.5mg  (0.5 tab) daily  You have been referred to our paramedicine program. Someone will contact you in order to schedule appointments.   Please follow up with a telephone visit in 4 weeks. This will be Tuesday April 28th at 10:00am.

## 2019-03-28 NOTE — Progress Notes (Addendum)
Heart Failure TeleHealth Note  Due to national recommendations of social distancing due to [place] 19, telehealth visit is felt to be most appropriate for this patient at this time.  I discussed the limitations, risks, security and privacy concerns of performing an evaluation and management service by telephone and the availability of in person appointments. I also discussed with the patient that there may be a patient responsible charge related to this service. The patient expressed understanding and agreed to proceed.   ID:  Tyler Wong, DOB [birth year], MRN [id number]  Location: Home  Provider location: [place] Type of Visit: Established patient, etc  PCP:  Patient, No Pcp Per  Cardiologist:  Dr Aundra Dubin  Chief Complaint: Chronic Systolic Heart Failure   History of Present Illness: Tyler Wong is a 53 y.o. male with a history of chronic systolic heart failure, CAD MI 2015 with DES to LAD, HTN, hyperlipidemia, prior tobacco abuse, and prior cocaine abuse  Last seen in clinic 02/28/19 and spiro was restarted. He did not get recheck labs. Paramedicine has since signed off on him because he will not return their phone calls to schedule meetings.   He presents via Psychiatric nurse for a telehealth visit today. Did not start spiro - says the pharmacy was out of it and he forgot to check back. He only gets SOB when he eats high salt foods, but says lasix resolves symptoms. Doing fine now. Gets SOB with stairs, but recovers quickly. No edema, orthopnea, or PND. No CP. Appetite okay. Energy level poor. He does snore and has never had a sleep study. Has rare dizziness if he goes from bending over to standing quickly. Eats high salt foods when he eats out. No missed doses of medication. He forgot to call Katie with paramedicine back to set up a visit. He does not currently have insurance, but has Medicaid pending. Smoking 1 pack every 2-3 days. No cocaine use. He has somebody  delivering groceries for him and does not expect to have issues in the future getting food.   Weights: ~176 lbs, stable for him.   Pt denies symptoms of cough, fevers, chills, or new SOB worrisome for COVID 19.  No sick contacts.   Past Medical History:  Diagnosis Date  . CAD in native artery 07/17/14   STEMI- LAD stenosis with DES  . CHF (congestive heart failure) ([place])   . Cocaine abuse ([place])   . COPD (chronic obstructive pulmonary disease) ([place])   . Dyspnea   . Hypertension   . ST elevation myocardial infarction (STEMI) involving left anterior descending (LAD) coronary artery with complication ([place])   . Tobacco abuse 07/17/2014   Past Surgical History:  Procedure Laterality Date  . CORONARY ANGIOPLASTY WITH STENT PLACEMENT  07/18/14   resolute DES to LAD STEMI  . LEFT HEART CATH N/A 07/17/2014   Procedure: LEFT HEART CATH;  Surgeon: [name], MD;  Location: [facility] CATH LAB;  Service: Cardiovascular;  Laterality: N/A;  . PERCUTANEOUS CORONARY STENT INTERVENTION (PCI-S)  07/17/2014   Procedure: PERCUTANEOUS CORONARY STENT INTERVENTION (PCI-S);  Surgeon: [name], MD;  Location: [facility] CATH LAB;  Service: Cardiovascular;;  . RIGHT/LEFT HEART CATH AND CORONARY ANGIOGRAPHY N/A 10/03/2018   Procedure: RIGHT/LEFT HEART CATH AND CORONARY ANGIOGRAPHY;  Surgeon: [name], MD;  Location: [facility] CV LAB;  Service: Cardiovascular;  Laterality: N/A;     Current Outpatient Medications  Medication Sig Dispense Refill  . aspirin 81  MG EC tablet Take 1 tablet (81 mg total) by mouth daily. 90 tablet 3  . atorvastatin (LIPITOR) 80 MG tablet Take 1 tablet (80 mg total) by mouth daily. 90 tablet 0  . carvedilol (COREG) 3.125 MG tablet Take 1 tablet (3.125 mg total) by mouth 2 (two) times daily with a meal. 180 tablet 0  . furosemide (LASIX) 40 MG tablet Take 1 tablet (40 mg total) by mouth 2 (two) times daily. 30 tablet 2  . Multiple Vitamin (MULTIVITAMIN) tablet Take 1 tablet by mouth  daily.    . sacubitril-valsartan (ENTRESTO) 24-26 MG Take 1 tablet by mouth 2 (two) times daily. 60 tablet 0  . albuterol (PROVENTIL HFA;VENTOLIN HFA) 108 (90 Base) MCG/ACT inhaler Inhale 2 puffs into the lungs every 4 (four) hours as needed for wheezing or shortness of breath. 1 Inhaler 2  . NITROSTAT 0.4 MG SL tablet PLACE 1 TABLET UNDER TONGUE AS NEEDED FOR CHEST PAIN EVERY 5 MINUTES X 3 MAX DOSES.  CALL 911 IF PAIN PERSISTS (Patient taking differently: Place 0.4 mg under the tongue every 5 (five) minutes as needed for chest pain. ) 25 tablet 3  . spironolactone (ALDACTONE) 25 MG tablet Take 0.5 tablets (12.5 mg total) by mouth daily for 30 days. (Patient not taking: Reported on 03/28/2019) 90 tablet 3   No current facility-administered medications for this encounter.     Allergies:   Patient has no known allergies.   Social History:  The patient  reports that he has been smoking cigarettes. He has a 15.00 pack-year smoking history. He has never used smokeless tobacco. He reports current drug use. Drug: Cocaine. He reports that he does not drink alcohol.   Family History:  The patient's family history includes Cirrhosis in his father; Heart attack in his brother, mother, and sister; Heart failure in his sister.   ROS:  Please see the history of present illness.   All other systems are personally reviewed and negative.   Exam:  [facility] Call) Lungs: Normal respiratory effort with conversation.  Neuro: Alert & oriented x 3.   Recent Labs: 10/02/2018: ALT 38; Magnesium 2.0 02/22/2019: B Natriuretic Peptide 757.7; BUN 16; Creatinine, Ser 1.31; Hemoglobin 13.9; Platelets 212; Potassium 4.0; Sodium 135  Personally reviewed   Wt Readings from Last 3 Encounters:  02/28/19 79.9 kg (176 lb 3.2 oz)  02/22/19 79.8 kg (176 lb)  10/17/18 74.8 kg (164 lb 12.8 oz)      ASSESSMENT AND PLAN:  1. Chronic systolic HF. NICM - Echo 2015 EF 40-45% - Echo 09/2018: EF 20-25%, grade 3 DD, mod MR, LA  and RA severely dilated, RV mod dilated, mod to severe TR, trivial pericardial effusion - LHC 09/2018: showed some CAD, but did not explain drop in EF - NYHA II. Volume sounds stable.  - Continue lasix 40 mg BID - Start spiro 12.5 mg daily. He says it takes about 1 week for this to be delivered through [facility]. Will need to get a BMET a week after starting. - Continue coreg 3.125 mg BID - Continue Entresto 24/26 mg BID - Repeat echo in June or July - Discharged from paramedicine due to not returning phone calls. He said he would like to be re-enrolled and would return calls. I will contact our [place] and nurse navigator to see if this is possible.    2. CAD - Had [place] 2015 with DES to LAD - As above, LHC 09/2018 showed CAD, but did not explain decreased in  EF - Continuelipitor and 81 mg aspirin - No s/s of ischemia.    3. History of cocaine/tobacco abuse - Smoking 1 pack every 2-3 days. Encourages complete cessation. - No cocaine use recently.   4. Snores - With daytime sleepiness. He has never had a sleep study.  - He is medicaid pending. Once approved, will plan to set up for sleep study.   5. Goals of Care - We discussed that he is at risk of having complications if he were to get COVID. I encouraged him to think about and talk to his family about what he would want if he were to get sick. He did not want to discuss further.   COVID screen The patient does not have any symptoms that suggest any further testing/ screening at this time.  Social distancing reinforced today.  Food Insecurity Screen He has somebody to deliver groceries. Does not anticipate this being a problem, but knows to call us if anything changes.   Relevant cardiac medications were reviewed at length with the patient today.  The patient does not have concerns regarding their medications at this time. Will provide 90 day refills of HF medications through [facility].   Patient Risk: After full review of this  patients clinical status, I feel that they are at moderate risk for cardiac decompensation at this time.  Follow up in 6 weeks.   Today, I have spent 16 minutes with the patient with telehealth technology discussing heart failure, medications, tobacco use, OSA, and COVID.    Signed, Georgiana Shore, NP  03/28/2019 9:43 AM  [facility] [address] [facility] [address] [phone] (office) [phone] (fax)

## 2019-03-28 NOTE — Addendum Note (Signed)
Encounter addended by: Georgiana Shore, NP on: 03/28/2019 9:43 AM  Actions taken: Clinical Note Signed

## 2019-04-10 MED FILL — FUROSEMIDE 40 MG TAB: 40 | 30 days supply | Qty: 60 | Fill #0

## 2019-04-11 ENCOUNTER — Other Ambulatory Visit (HOSPITAL_COMMUNITY): Payer: Self-pay

## 2019-04-25 ENCOUNTER — Ambulatory Visit (HOSPITAL_COMMUNITY)
Admission: RE | Admit: 2019-04-25 | Discharge: 2019-04-25 | Disposition: A | Discharge status: Home / Self Care | Payer: MEDICAID | Source: Ambulatory Visit | Attending: Internal Medicine | Admitting: Internal Medicine

## 2019-04-25 ENCOUNTER — Other Ambulatory Visit: Payer: Self-pay

## 2019-05-04 ENCOUNTER — Telehealth (HOSPITAL_COMMUNITY): Payer: Self-pay | Admitting: Licensed Clinical Social Worker

## 2019-05-04 ENCOUNTER — Emergency Department (HOSPITAL_COMMUNITY): Payer: Medicaid Other

## 2019-05-04 ENCOUNTER — Other Ambulatory Visit: Payer: Self-pay

## 2019-05-04 ENCOUNTER — Inpatient Hospital Stay (HOSPITAL_COMMUNITY)
Admission: EM | Admit: 2019-05-04 | Discharge: 2019-05-06 | DRG: 292 | Disposition: A | Discharge status: Home / Self Care | Payer: Medicaid Other | Attending: Internal Medicine | Admitting: Internal Medicine

## 2019-05-04 DIAGNOSIS — I5023 Acute on chronic systolic (congestive) heart failure: Secondary | ICD-10-CM | POA: Diagnosis present

## 2019-05-04 DIAGNOSIS — Z716 Tobacco abuse counseling: Secondary | ICD-10-CM

## 2019-05-04 DIAGNOSIS — Z7151 Drug abuse counseling and surveillance of drug abuser: Secondary | ICD-10-CM

## 2019-05-04 DIAGNOSIS — I5043 Acute on chronic combined systolic (congestive) and diastolic (congestive) heart failure: Secondary | ICD-10-CM

## 2019-05-04 DIAGNOSIS — F1410 Cocaine abuse, uncomplicated: Secondary | ICD-10-CM

## 2019-05-04 DIAGNOSIS — I313 Pericardial effusion (noninflammatory): Secondary | ICD-10-CM | POA: Diagnosis present

## 2019-05-04 DIAGNOSIS — Z1159 Encounter for screening for other viral diseases: Secondary | ICD-10-CM

## 2019-05-04 DIAGNOSIS — Z955 Presence of coronary angioplasty implant and graft: Secondary | ICD-10-CM

## 2019-05-04 DIAGNOSIS — E785 Hyperlipidemia, unspecified: Secondary | ICD-10-CM

## 2019-05-04 DIAGNOSIS — Z9114 Patient's other noncompliance with medication regimen: Secondary | ICD-10-CM

## 2019-05-04 DIAGNOSIS — I110 Hypertensive heart disease with heart failure: Principal | ICD-10-CM | POA: Diagnosis present

## 2019-05-04 DIAGNOSIS — J449 Chronic obstructive pulmonary disease, unspecified: Secondary | ICD-10-CM | POA: Diagnosis present

## 2019-05-04 DIAGNOSIS — F17210 Nicotine dependence, cigarettes, uncomplicated: Secondary | ICD-10-CM | POA: Diagnosis present

## 2019-05-04 DIAGNOSIS — Z8249 Family history of ischemic heart disease and other diseases of the circulatory system: Secondary | ICD-10-CM

## 2019-05-04 DIAGNOSIS — B1920 Unspecified viral hepatitis C without hepatic coma: Secondary | ICD-10-CM | POA: Diagnosis present

## 2019-05-04 DIAGNOSIS — Z7982 Long term (current) use of aspirin: Secondary | ICD-10-CM

## 2019-05-04 DIAGNOSIS — I5042 Chronic combined systolic (congestive) and diastolic (congestive) heart failure: Secondary | ICD-10-CM | POA: Diagnosis present

## 2019-05-04 DIAGNOSIS — F1190 Opioid use, unspecified, uncomplicated: Secondary | ICD-10-CM | POA: Diagnosis present

## 2019-05-04 DIAGNOSIS — R0602 Shortness of breath: Secondary | ICD-10-CM | POA: Diagnosis not present

## 2019-05-04 DIAGNOSIS — I248 Other forms of acute ischemic heart disease: Secondary | ICD-10-CM | POA: Diagnosis present

## 2019-05-04 DIAGNOSIS — I25118 Atherosclerotic heart disease of native coronary artery with other forms of angina pectoris: Secondary | ICD-10-CM

## 2019-05-04 DIAGNOSIS — I081 Rheumatic disorders of both mitral and tricuspid valves: Secondary | ICD-10-CM | POA: Diagnosis present

## 2019-05-04 DIAGNOSIS — Z720 Tobacco use: Secondary | ICD-10-CM | POA: Diagnosis present

## 2019-05-04 DIAGNOSIS — Z9119 Patient's noncompliance with other medical treatment and regimen: Secondary | ICD-10-CM

## 2019-05-04 DIAGNOSIS — Z79899 Other long term (current) drug therapy: Secondary | ICD-10-CM

## 2019-05-04 DIAGNOSIS — I252 Old myocardial infarction: Secondary | ICD-10-CM

## 2019-05-04 DIAGNOSIS — I5020 Unspecified systolic (congestive) heart failure: Secondary | ICD-10-CM | POA: Diagnosis present

## 2019-05-04 DIAGNOSIS — G4700 Insomnia, unspecified: Secondary | ICD-10-CM | POA: Diagnosis present

## 2019-05-04 DIAGNOSIS — I2510 Atherosclerotic heart disease of native coronary artery without angina pectoris: Secondary | ICD-10-CM

## 2019-05-04 DIAGNOSIS — I509 Heart failure, unspecified: Secondary | ICD-10-CM

## 2019-05-04 DIAGNOSIS — F1490 Cocaine use, unspecified, uncomplicated: Secondary | ICD-10-CM | POA: Diagnosis present

## 2019-05-04 LAB — RAPID URINE DRUG SCREEN, HOSP PERFORMED
Amphetamines: NOT DETECTED
Barbiturates: NOT DETECTED
Benzodiazepines: NOT DETECTED
Cocaine: POSITIVE — AB
Opiates: NOT DETECTED
Tetrahydrocannabinol: NOT DETECTED

## 2019-05-04 LAB — COMPREHENSIVE METABOLIC PANEL
ALT: 19 U/L (ref 0–44)
AST: 16 U/L (ref 15–41)
Albumin: 3.1 g/dL — ABNORMAL LOW (ref 3.5–5.0)
Alkaline Phosphatase: 83 U/L (ref 38–126)
Anion gap: 12 (ref 5–15)
BUN: 26 mg/dL — ABNORMAL HIGH (ref 6–20)
CO2: 23 mmol/L (ref 22–32)
Calcium: 8.8 mg/dL — ABNORMAL LOW (ref 8.9–10.3)
Chloride: 105 mmol/L (ref 98–111)
Creatinine, Ser: 1.22 mg/dL (ref 0.61–1.24)
GFR calc Af Amer: >60 mL/min (ref >60)
GFR calc non Af Amer: >60 mL/min (ref >60)
Glucose, Bld: 123 mg/dL — ABNORMAL HIGH (ref 70–99)
Potassium: 4 mmol/L (ref 3.5–5.1)
Sodium: 140 mmol/L (ref 135–145)
Total Bilirubin: 0.9 mg/dL (ref 0.3–1.2)
Total Protein: 6.5 g/dL (ref 6.5–8.1)

## 2019-05-04 LAB — CBC WITH DIFFERENTIAL/PLATELET
Abs Immature Granulocytes: 0.03 10*3/uL (ref 0.00–0.07)
Basophils Absolute: 0 10*3/uL (ref 0.0–0.1)
Basophils Relative: 1 %
Eosinophils Absolute: 0.2 10*3/uL (ref 0.0–0.5)
Eosinophils Relative: 2 %
HCT: 42.4 % (ref 39.0–52.0)
Hemoglobin: 14 g/dL (ref 13.0–17.0)
Immature Granulocytes: 0 %
Lymphocytes Relative: 14 %
Lymphs Abs: 1.1 10*3/uL (ref 0.7–4.0)
MCH: 28.6 pg (ref 26.0–34.0)
MCHC: 33 g/dL (ref 30.0–36.0)
MCV: 86.5 fL (ref 80.0–100.0)
Monocytes Absolute: 0.9 10*3/uL (ref 0.1–1.0)
Monocytes Relative: 11 %
Neutro Abs: 5.7 10*3/uL (ref 1.7–7.7)
Neutrophils Relative %: 72 %
Platelets: 243 10*3/uL (ref 150–400)
RBC: 4.9 MIL/uL (ref 4.22–5.81)
RDW: 15.8 % — ABNORMAL HIGH (ref 11.5–15.5)
WBC: 7.9 10*3/uL (ref 4.0–10.5)
nRBC: 0 % (ref 0.0–0.2)

## 2019-05-04 LAB — SARS CORONAVIRUS 2 BY RT PCR (HOSPITAL ORDER, PERFORMED IN ~~LOC~~ HOSPITAL LAB): SARS Coronavirus 2: NEGATIVE

## 2019-05-04 LAB — TROPONIN I
Troponin I: 0.07 ng/mL (ref <0.03)
Troponin I: 0.07 ng/mL (ref <0.03)
Troponin I: 0.06 ng/mL (ref <0.03)

## 2019-05-04 LAB — BRAIN NATRIURETIC PEPTIDE: B Natriuretic Peptide: 1206.6 pg/mL — ABNORMAL HIGH (ref 0.0–100.0)

## 2019-05-04 MED ORDER — SACUBITRIL-VALSARTAN 24-26 MG PO TABS
1.0000 | ORAL_TABLET | Freq: Two times a day (BID) | ORAL | Status: DC
  Administered 2019-05-04 – 2019-05-06 (×4): 1 via ORAL
  Filled 2019-05-04 (×4): qty 1

## 2019-05-04 MED ORDER — ALBUTEROL SULFATE (2.5 MG/3ML) 0.083% IN NEBU: 3.0000 mL | INHALATION_SOLUTION | RESPIRATORY_TRACT | Status: DC | PRN

## 2019-05-04 MED ORDER — ASPIRIN 81 MG PO TBEC: 81.0000 mg | DELAYED_RELEASE_TABLET | Freq: Every day | ORAL | Status: DC

## 2019-05-04 MED ORDER — ATORVASTATIN CALCIUM 80 MG PO TABS
80.0000 mg | ORAL_TABLET | Freq: Every day | ORAL | Status: DC
  Administered 2019-05-05 – 2019-05-06 (×2): 80 mg via ORAL
  Filled 2019-05-04 (×2): qty 1

## 2019-05-04 MED ORDER — ASPIRIN 81 MG PO CHEW
324.0000 mg | CHEWABLE_TABLET | Freq: Once | ORAL | Status: AC
  Administered 2019-05-04: 324 mg via ORAL
  Filled 2019-05-04: qty 4

## 2019-05-04 MED ORDER — ACETAMINOPHEN 650 MG RE SUPP: 650.0000 mg | Freq: Four times a day (QID) | RECTAL | Status: DC | PRN

## 2019-05-04 MED ORDER — ACETAMINOPHEN 325 MG PO TABS: 650.0000 mg | ORAL_TABLET | Freq: Four times a day (QID) | ORAL | Status: DC | PRN

## 2019-05-04 MED ORDER — FUROSEMIDE 10 MG/ML IJ SOLN
40.0000 mg | Freq: Once | INTRAMUSCULAR | Status: AC
  Administered 2019-05-04: 40 mg via INTRAVENOUS
  Filled 2019-05-04: qty 4

## 2019-05-04 MED ORDER — FUROSEMIDE 10 MG/ML IJ SOLN
40.0000 mg | Freq: Two times a day (BID) | INTRAMUSCULAR | Status: DC
  Administered 2019-05-04 – 2019-05-05 (×2): 40 mg via INTRAVENOUS
  Filled 2019-05-04 (×2): qty 4

## 2019-05-04 MED ORDER — ASPIRIN EC 81 MG PO TBEC
81.0000 mg | DELAYED_RELEASE_TABLET | Freq: Every day | ORAL | Status: DC
  Administered 2019-05-05 – 2019-05-06 (×2): 81 mg via ORAL
  Filled 2019-05-04 (×2): qty 1

## 2019-05-04 MED ORDER — ENOXAPARIN SODIUM 40 MG/0.4ML ~~LOC~~ SOLN
40.0000 mg | SUBCUTANEOUS | Status: DC
  Administered 2019-05-04 – 2019-05-05 (×2): 40 mg via SUBCUTANEOUS
  Filled 2019-05-04 (×2): qty 0.4

## 2019-05-04 MED ORDER — SPIRONOLACTONE 12.5 MG HALF TABLET
12.5000 mg | ORAL_TABLET | Freq: Every day | ORAL | Status: DC
  Administered 2019-05-04 – 2019-05-06 (×3): 12.5 mg via ORAL
  Filled 2019-05-04 (×3): qty 1

## 2019-05-04 NOTE — H&P (Signed)
Date: 05/04/2019               Patient Name:  Tyler Wong MRN: [id number]  DOB: 01-Oct-1966 Age / Sex: 53 y.o., male   PCP: Patient, No Pcp Per         Medical Service: Internal Medicine Teaching Service         Attending Physician: Dr. Rebeca Alert, Raynaldo Opitz, MD    First Contact: Dr. Annie Paras Pager: [phone]  Second Contact: Dr. Shan Levans Pager: [phone]       After Hours (After 5p/  First Contact Pager: [phone]  weekends / holidays): Second Contact Pager: [phone]   Chief Complaint: dyspnea  History of Present Illness: Tyler Wong is 53 yo man with a medical history of systolic heart failure (EF 20-25% in 2019), CAD with MI and DES to LAD in 2015, HTN, HLD, and cocaine use disorder who presented with four days of dyspnea and abdominal swelling. His dyspnea is present at rest and worsens with activity and lying down. He endorses mild "burning" left-sided chest pain that worsens when he lies down as well. He has a nonproductive cough. He denies lower extremity edema, but states that his abdomen usually gets full when he gets volume overloaded. He states that he takes lasix 40mg  BID at home and has not missed any doses. He feels like he has not been urinating as much as he used to after taking lasix. He weighs himself regularly at home and has gained 5lbs recently (167 to 172). He reports compliance with a low-sodium diet. He ran out of spironolactone one month ago because of difficulties getting it through [facility]. He reports he uses cocaine infrequently with his last use one month ago.   Upon arrival to the ED, he was afebrile and normotensive. Labs significant for Cr 1.2 (BL 1.3), BNP 1206, troponin 0.07x2, and positive cocaine on UDS. EKG showed NSR with bi-atrial enlargement and no changes from prior. CXR showed cardiomegaly, but no edema or opacities. He received aspirin and lasix 40mg  IV in the ED.   Meds:  Current Meds  Medication Sig  . albuterol (PROVENTIL HFA;VENTOLIN HFA) 108  (90 Base) MCG/ACT inhaler Inhale 2 puffs into the lungs every 4 (four) hours as needed for wheezing or shortness of breath.  Marland Kitchen aspirin 81 MG EC tablet Take 1 tablet (81 mg total) by mouth daily.  Marland Kitchen atorvastatin (LIPITOR) 80 MG tablet Take 1 tablet (80 mg total) by mouth daily.  . carvedilol (COREG) 3.125 MG tablet Take 1 tablet (3.125 mg total) by mouth 2 (two) times daily with a meal.  . furosemide (LASIX) 40 MG tablet Take 1 tablet (40 mg total) by mouth 2 (two) times daily.  . Multiple Vitamin (MULTIVITAMIN) tablet Take 1 tablet by mouth daily.  Marland Kitchen NITROSTAT 0.4 MG SL tablet PLACE 1 TABLET UNDER TONGUE AS NEEDED FOR CHEST PAIN EVERY 5 MINUTES X 3 MAX DOSES.  CALL 911 IF PAIN PERSISTS (Patient taking differently: Place 0.4 mg under the tongue every 5 (five) minutes as needed for chest pain. )  . sacubitril-valsartan (ENTRESTO) 24-26 MG Take 1 tablet by mouth 2 (two) times daily.   Allergies: Allergies as of 05/04/2019  . (No Known Allergies)   Past Medical History:  Diagnosis Date  . CAD in native artery 07/17/14   STEMI- LAD stenosis with DES  . CHF (congestive heart failure) ([place])   . Cocaine abuse ([place])   . COPD (chronic obstructive pulmonary disease) ([place])   .  Dyspnea   . Hypertension   . ST elevation myocardial infarction (STEMI) involving left anterior descending (LAD) coronary artery with complication ([place])   . Tobacco abuse 07/17/2014   Past Surgical History:  Procedure Laterality Date  . CORONARY ANGIOPLASTY WITH STENT PLACEMENT  07/18/14   resolute DES to LAD STEMI  . LEFT HEART CATH N/A 07/17/2014   Procedure: LEFT HEART CATH;  Surgeon: [name], MD;  Location: [facility] CATH LAB;  Service: Cardiovascular;  Laterality: N/A;  . PERCUTANEOUS CORONARY STENT INTERVENTION (PCI-S)  07/17/2014   Procedure: PERCUTANEOUS CORONARY STENT INTERVENTION (PCI-S);  Surgeon: [name], MD;  Location: [facility] CATH LAB;  Service: Cardiovascular;;  . RIGHT/LEFT HEART CATH AND CORONARY ANGIOGRAPHY  N/A 10/03/2018   Procedure: RIGHT/LEFT HEART CATH AND CORONARY ANGIOGRAPHY;  Surgeon: [name], MD;  Location: [facility] CV LAB;  Service: Cardiovascular;  Laterality: N/A;   Family History:  Family History  Problem Relation Age of Onset  . Cirrhosis Father   . Heart attack Mother   . Heart attack Sister   . Heart failure Sister   . Heart attack Brother    Social History: Lives with male partner. Not currently working. Smokes 1 pack of cigarettes per week. Infrequent etoh use. Smokes crack cocaine, last used one month ago. Denies other illicit drugs.   Review of Systems: A complete ROS was negative except as per HPI.   Physical Exam: Blood pressure 107/87, pulse 93, temperature 97.8 F (36.6 C), temperature source Oral, resp. rate (!) 27, height 5' 10.5" (1.791 m), weight 78 kg, SpO2 99 %.  Constitutional: Well-developed, well-nourished, and in no distress. No JVD.  Eyes: Pupils are equal, round, and reactive to light. EOM are normal.  Cardiovascular: Regular rate. S4 present. Systolic murmur.  Pulmonary/Chest: Effort normal on room air. Faint bibasilar crackles.  Abdominal: Bowel sounds present. Soft, non-distended, non-tender. No edema.  Ext: No lower extremity edema. Skin: Warm and dry. No rashes or wounds.  EKG: personally reviewed my interpretation is NSR with bi-atrial enlargement and no changes from prior  CXR: personally reviewed my interpretation is cardiomegaly without edema or opacities  Assessment & Plan by Problem: Active Problems:   HFrEF (heart failure with reduced ejection fraction) [facility])  Tyler Wong is 53 yo man with a medical history of systolic heart failure (EF 20-25% in 2019), CAD with MI and DES to LAD in 2015, HTN, HLD, and cocaine use disorder who presented with dyspnea and abdominal swelling consistent with heart failure exacerbation. He is hemodynamically stable and satting well on room air.   Acute on chronic systolic heart failure  exacerbation HTN CAD  In the setting of cocaine use and medication noncompliance (has been without spironolactone for a month). Subjective weight gain of 5lbs. He reports a dry weight of 167, but weighed 176lbs at cardiology visit two months ago. Mildly volume up on exam with bibasilar crackles, but no lower extremity edema. BNP elevated >1,000. CXR without pulmonary edema. Troponin mildly elevated, likely demand ischemia in the setting of heart failure exacerbation. Last echo 09/2018 showed EF 20-25% with diffuse hypokinesis, grade 3 diastolic dysfunction, moderate MV regurg, moderate-severe TV regurg, and moderately increased pulmonary artery pressure. - Cards following, appreciate recs - IV lasix 40mg  BID - Trend troponins - Echo - Continue home aspirin  - Continue home entresto - Resume home spironolactone 12.5mg  daily - Hold home coreg in the setting of acute heart failure exacerbation - Daily weights - Strict I/Os - Tele  HLD: continue home lipitor  FEN: no IV fluids, heart healthy with 1852ml fluid restriction diet, replace electrolytes as needed  DVT ppx: lovenox Code status: FULL code  Dispo: Admit patient to Inpatient with expected length of stay greater than 2 midnights.  Signed: Corinne Ports, MD 05/04/2019, 3:36 PM  Pager: [phone]

## 2019-05-04 NOTE — Consult Note (Signed)
Cardiology Consultation:   Patient ID: Tyler Wong MRN: [id number]; DOB: 1966/11/15  Admit date: 05/04/2019 Date of Consult: 05/04/2019  Primary Care Provider: Patient, No Pcp Per Primary Cardiologist: Shelva Majestic, MD  Primary Electrophysiologist:  None  Advanced Heart failure:  Dr. Aundra Dubin  CC: SOB   Patient Profile:   Tyler Wong is a 53 y.o. male with a hx of chronic systolic HF, CAD with MI 5885, DES to LAD, HTN, HLD, and cocaine abuse.  Has been using cocaine for 30 years, + tobacco who is being seen today for the evaluation of SOB at the request of Dr. Ronnald Nian.  History of Present Illness:   Mr. Grays with above hx including chronic systolic HF with echo 02/03/73 with EF 20-25%, mod MR, mod-severe TR.  CAD with MI 2015 and LAD stent and last cath 10/02/18 with 30% in stent restenosis of LAD, nondominant RCA providing a PLV with 70% stenosis, 90% ostial OM1 with planned medical therapy.  Elevated Lt heart filling pressure, preserved cardiac output, primarily pulmonary venous hypertension.  Plan had been to repeat echo in Jan 2020.  On coreg, entresto, ASA and lipitor.    Last seen with virtual visit 03/28/19   Today he has increased SOB and sent to ER. He has increasing SOB over last several days and unable to lie flat.  He did have some chest pain yesterday.  His SOB began 4 days ago.  Some cough but more allergy type.  No fever, no headache until mild one today.  No diarrhea.  He has not been able to get his spironolactone and has been out over 1 month.  He tries not to eat salt.  He feels he has fluid in his abd, not so much in his legs.  He stated he was 172 lbs today at home in March he was 176 lbs.   He continue to smoke tobacco 1 ppW and cocain once a month.     Na 140, k+ 4.0, BUN 26 Cr 1.22 Alb. 3.1  Troponin 0.07 WBC 7.9, hgb 14, Hct 42.4  PCXR;   Cardiomegaly and right upper lobe scarring. No acute finding when compared to priors  COVID test not yet back   EKG:  The EKG  was personally reviewed and demonstrates:  SR with bi-atrial enlargement and prior R wave progression, and LVH but no acute changes. Telemetry:  Telemetry was personally reviewed and demonstrates:  SR  Currently is to receive lasix 40 mg once IV.  He is SOB and wants the lasix.  Past Medical History:  Diagnosis Date  . CAD in native artery 07/17/14   STEMI- LAD stenosis with DES  . CHF (congestive heart failure) ([place])   . Cocaine abuse ([place])   . COPD (chronic obstructive pulmonary disease) ([place])   . Dyspnea   . Hypertension   . ST elevation myocardial infarction (STEMI) involving left anterior descending (LAD) coronary artery with complication ([place])   . Tobacco abuse 07/17/2014    Past Surgical History:  Procedure Laterality Date  . CORONARY ANGIOPLASTY WITH STENT PLACEMENT  07/18/14   resolute DES to LAD STEMI  . LEFT HEART CATH N/A 07/17/2014   Procedure: LEFT HEART CATH;  Surgeon: [name], MD;  Location: [facility] CATH LAB;  Service: Cardiovascular;  Laterality: N/A;  . PERCUTANEOUS CORONARY STENT INTERVENTION (PCI-S)  07/17/2014   Procedure: PERCUTANEOUS CORONARY STENT INTERVENTION (PCI-S);  Surgeon: [name], MD;  Location: [facility] CATH LAB;  Service: Cardiovascular;;  . RIGHT/LEFT HEART  CATH AND CORONARY ANGIOGRAPHY N/A 10/03/2018   Procedure: RIGHT/LEFT HEART CATH AND CORONARY ANGIOGRAPHY;  Surgeon: [name], MD;  Location: [place] CV LAB;  Service: Cardiovascular;  Laterality: N/A;     Home Medications:  Prior to Admission medications   Medication Sig Start Date End Date Taking? Authorizing Provider  albuterol (PROVENTIL HFA;VENTOLIN HFA) 108 (90 Base) MCG/ACT inhaler Inhale 2 puffs into the lungs every 4 (four) hours as needed for wheezing or shortness of breath. 10/06/18   Argentina Donovan, PA-C  aspirin 81 MG EC tablet Take 1 tablet (81 mg total) by mouth daily. 10/06/18   Argentina Donovan, PA-C  atorvastatin (LIPITOR) 80 MG tablet Take 1 tablet (80 mg total)  by mouth daily. 03/28/19   Georgiana Shore, NP  carvedilol (COREG) 3.125 MG tablet Take 1 tablet (3.125 mg total) by mouth 2 (two) times daily with a meal. 03/28/19 06/26/19  Georgiana Shore, NP  furosemide (LASIX) 40 MG tablet Take 1 tablet (40 mg total) by mouth 2 (two) times daily. 03/28/19   Georgiana Shore, NP  Multiple Vitamin (MULTIVITAMIN) tablet Take 1 tablet by mouth daily.    [provider]  NITROSTAT 0.4 MG SL tablet PLACE 1 TABLET UNDER TONGUE AS NEEDED FOR CHEST PAIN EVERY 5 MINUTES X 3 MAX DOSES.  CALL 911 IF PAIN PERSISTS Patient taking differently: Place 0.4 mg under the tongue every 5 (five) minutes as needed for chest pain.  02/10/18   [name], MD  sacubitril-valsartan (ENTRESTO) 24-26 MG Take 1 tablet by mouth 2 (two) times daily. 03/28/19   Georgiana Shore, NP  spironolactone (ALDACTONE) 25 MG tablet Take 0.5 tablets (12.5 mg total) by mouth daily for 30 days. 03/28/19 04/27/19  Georgiana Shore, NP    Inpatient Medications: Scheduled Meds:  Continuous Infusions:  PRN Meds:   Allergies:   No Known Allergies  Social History:   Social History   Socioeconomic History  . Marital status: Single    Spouse name: Not on file  . Number of children: 2  . Years of education: 57  . Highest education level: 12th grade  Occupational History  . Occupation: Landscaping  Social Needs  . Financial resource strain: Very hard  . Food insecurity:    Worry: Often true    Inability: Often true  . Transportation needs:    Medical: Yes    Non-medical: Yes  Tobacco Use  . Smoking status: Current Every Day Smoker    Packs/day: 0.50    Years: 30.00    Pack years: 15.00    Types: Cigarettes  . Smokeless tobacco: Never Used  . Tobacco comment: .5 PK PER DAY  Substance and Sexual Activity  . Alcohol use: No    Comment: Patient denies abuse, states social drinker  . Drug use: Yes    Types: Cocaine    Comment: heroin  . Sexual activity: Yes  Lifestyle  . Physical  activity:    Days per week: Not on file    Minutes per session: Not on file  . Stress: Not on file  Relationships  . Social connections:    Talks on phone: Not on file    Gets together: Not on file    Attends religious service: Not on file    Active member of club or organization: Not on file    Attends meetings of clubs or organizations: Not on file    Relationship status: Not on file  .  Intimate partner violence:    Fear of current or ex partner: Not on file    Emotionally abused: Not on file    Physically abused: Not on file    Forced sexual activity: Not on file  Other Topics Concern  . Not on file  Social History Narrative   Lives in [place] with his sister and girlfriend. He works Aeronautical engineer work.     Family History:    Family History  Problem Relation Age of Onset  . Cirrhosis Father   . Heart attack Mother   . Heart attack Sister   . Heart failure Sister   . Heart attack Brother      ROS:  Please see the history of present illness.  General:no colds or fevers, no weight changes Skin:no rashes or ulcers HEENT:no blurred vision, no congestion CV:see HPI PUL:see HPI GI:no diarrhea constipation or melena, no indigestion GU:no hematuria, no dysuria MS:no joint pain, no claudication Neuro:no syncope, no lightheadedness Endo:no diabetes, no thyroid disease  All other ROS reviewed and negative.     Physical Exam/Data:   Vitals:   05/04/19 1030 05/04/19 1100 05/04/19 1115 05/04/19 1130  BP: 111/86 112/90 (!) 117/93 118/81  Pulse: 87 89 91 97  Resp: (!) 28 (!) 28 (!) 28 (!) 24  Temp:      TempSrc:      SpO2: 98% 99% 99% 100%  Weight:      Height:       No intake or output data in the 24 hours ending 05/04/19 1147 Last 3 Weights 05/04/2019 02/28/2019 02/22/2019  Weight (lbs) 172 lb 176 lb 3.2 oz 176 lb  Weight (kg) 78.019 kg 79.924 kg 79.833 kg     Body mass index is 24.33 kg/m.  Per Dr. Meda Coffee General:  Well nourished, well developed,  in no acute distress HEENT: normal Lymph: no adenopathy Neck: no JVD Endocrine:  No thryomegaly Vascular: No carotid bruits; FA pulses 2+ bilaterally without bruits  Cardiac:  normal S1, S2; +S4, RRR; 2/6 systolic murmur Lungs:  minimal crackles at bases B/L, no wheezing, rhonchi or rales  Abd: increased abdominal girth, no hepatomegaly  Ext: no edema Musculoskeletal:  No deformities, BUE and BLE strength normal and equal Skin: warm and dry  Neuro:  CNs 2-12 intact, no focal abnormalities noted Psych:  Normal affect   Relevant CV Studies: Echo 10/02/18 Study Conclusions  - Left ventricle: The cavity size was severely dilated. Systolic   function was severely reduced. The estimated ejection fraction   was in the range of 20% to 25%. Diffuse hypokinesis. Akinesis of   the apical myocardium. Doppler parameters are consistent with a   reversible restrictive pattern, indicative of decreased left   ventricular diastolic compliance and/or increased left atrial   pressure (grade 3 diastolic dysfunction). - Aortic valve: There was trivial regurgitation. - Mitral valve: Calcified annulus. Mildly thickened leaflets . The   findings are consistent with mild stenosis. There was moderate   regurgitation. - Left atrium: The atrium was severely dilated. - Right ventricle: The cavity size was moderately dilated. Wall   thickness was normal. - Right atrium: The atrium was severely dilated. - Atrial septum: No defect or patent foramen ovale was identified. - Tricuspid valve: There was moderate-severe regurgitation. - Pulmonary arteries: Systolic pressure was moderately increased. - Pericardium, extracardiac: A trivial pericardial effusion was   identified. There was a left pleural effusion.  Impressions: - 1. LV EF severely reduced with ventricular dilation. Wall  motion   appears diffusely worse at apex, involving dyskinesis of inferior   apex and severe hypokinesis/akinesis of the anterior,    anterolateral, inferolateral, and inferoseptal apex.     2. At least moderate MR due to functional noncoaptation of the   leaflets.     3. Moderate-severe TR with elevated right sided pressures.     4. Trivial pericardial effusion.     Overall findings are similar to prior, with slightly worse   function but similar pattern.  Rt and Lt cardiac cath 10/03/18 . Elevated left heart filling pressure (PCWP and LVEDP).  2. Preserved cardiac output.  3. Primarily pulmonary venous hypertension.  4. Nondominant RCA providing a PLV with 70% stenosis.  90% ostial moderate OM1.  Will plan medical management of coronary disease.  No chest pain and does not explain fall in EF.   Laboratory Data:  Chemistry Recent Labs  Lab 05/04/19 1006  NA 140  K 4.0  CL 105  CO2 23  GLUCOSE 123*  BUN 26*  CREATININE 1.22  CALCIUM 8.8*  GFRNONAA >60  GFRAA >60  ANIONGAP 12    Recent Labs  Lab 05/04/19 1006  PROT 6.5  ALBUMIN 3.1*  AST 16  ALT 19  ALKPHOS 83  BILITOT 0.9   Hematology Recent Labs  Lab 05/04/19 1006  WBC 7.9  RBC 4.90  HGB 14.0  HCT 42.4  MCV 86.5  MCH 28.6  MCHC 33.0  RDW 15.8*  PLT 243   Cardiac Enzymes Recent Labs  Lab 05/04/19 1006  TROPONINI 0.07*   No results for input(s): TROPIPOC in the last 168 hours.  BNPNo results for input(s): BNP, PROBNP in the last 168 hours.  DDimer No results for input(s): DDIMER in the last 168 hours.  Radiology/Studies:  Dg Chest Portable 1 View  Result Date: 05/04/2019 CLINICAL DATA:  Shortness of breath today EXAM: PORTABLE CHEST 1 VIEW COMPARISON:  02/22/2019 FINDINGS: Cardiomegaly.  Stable mediastinal contours. Unchanged streaky opacity over the right upper lobe. No edema, effusion, or pneumothorax. IMPRESSION: Cardiomegaly and right upper lobe scarring. No acute finding when compared to priors. Electronically Signed   By: Monte Fantasia M.D.   On: 05/04/2019 10:16    Assessment and Plan:   1. Acute on chronic  systolic HF with EF 09-81%.   1. Continue IV lasix 40 mg BID.  2. RESTART SPIRONOLACTONE 12.5 MG PO DAILY 3. Continue Entresto 4. repeat echocardiogram   5. Hold carvedilol in the settings of acute CHF 2. CAD with patent LAD stent and RCA disease non dominant. No chest pain. 3. Tobacco use has decreased,   4. Cocaine abuse - needs to stop. 5. COVID test neg.  We will follow  For questions or updates, please contact [place] Please consult [email or web address] for contact info under   Signed, Ena Dawley, MD 05/04/2019 11:47 AM

## 2019-05-04 NOTE — Telephone Encounter (Signed)
CSW received call from Heart Failure front desk stating that pt was there inquiring about medications that he though CSW was ordering for him.  After chart review CSW found that pt is an active member with [facility] who sends the patient medications for free but it is the patients responsibility to order these medications.    Pt without a phone at this time so CSW called [facility] and had medications ordered- they will arrive to pt home within 7-10 days.  Pt states that he only has about 2 days left of lasix- CSW saw that prescription had been sent to [facility] at the end of march and it is on the [facility] $4 list- informed pt he will need to pick up this medication to hold him over until his meds come from [facility] provided pt with number to [facility] so that he can follow up with them in the future when he is low on meds  CSW will continue to follow and assist as needed  [name], [facility] Desk#: [phone] Cell#: [phone]

## 2019-05-04 NOTE — ED Provider Notes (Signed)
[facility] EMERGENCY DEPARTMENT Tyler Wong Note   CSN: [id number] Arrival date & time: 05/04/19  0945    History   Chief Complaint Chief Complaint  Tyler Wong presents with  . Shortness of Breath    HPI Tyler Wong is a 53 y.o. male.     The history is provided by the Tyler Wong.  Shortness of Breath  Severity:  Moderate Onset quality:  Gradual Timing:  Constant Progression:  Worsening Chronicity:  Recurrent Context: not URI   Context comment:  Hx of CAD, COPD, CHF with worsening SOB. Tyler Wong went to clinic today and was sent due to increased work of breathing. Has had some chest pain yesterday. None today. Increasing SOB over last several days, unable to lay flat and worse when walking.  Relieved by:  Nothing Worsened by:  Exertion Associated symptoms: chest pain   Associated symptoms: no abdominal pain, no claudication, no cough, no diaphoresis, no ear pain, no fever, no headaches, no neck pain, no rash, no sore throat, no sputum production, no vomiting and no wheezing   Risk factors comment:  CAD, CHF   Past Medical History:  Diagnosis Date  . CAD in native artery 07/17/14   STEMI- LAD stenosis with DES  . CHF (congestive heart failure) ([place])   . Cocaine abuse ([place])   . COPD (chronic obstructive pulmonary disease) ([place])   . Dyspnea   . Hypertension   . ST elevation myocardial infarction (STEMI) involving left anterior descending (LAD) coronary artery with complication ([place])   . Tobacco abuse 07/17/2014    Tyler Wong Active Problem List   Diagnosis Date Noted  . Acute combined systolic and diastolic congestive heart failure ([place]) 10/02/2018  . Hepatitis C 04/13/2017  . Current non-adherence to medical treatment 04/13/2017  . Acute on chronic combined systolic (congestive) and diastolic (congestive) heart failure ([place]) 04/13/2017  . HTN (hypertension) 04/12/2017  . Insomnia 01/18/2017  . Chronic combined systolic and diastolic congestive heart failure  ([place]) 01/04/2017  . Hyperlipidemia 01/04/2017  . Chest pain 12/30/2016  . CAD S/P percutaneous coronary angioplasty 07/18/2014  . Tobacco abuse 07/17/2014  . Cocaine use 07/17/2014    Past Surgical History:  Procedure Laterality Date  . CORONARY ANGIOPLASTY WITH STENT PLACEMENT  07/18/14   resolute DES to LAD STEMI  . LEFT HEART CATH N/A 07/17/2014   Procedure: LEFT HEART CATH;  Surgeon: Tyler Sine, MD;  Location: [facility] CATH LAB;  Service: Cardiovascular;  Laterality: N/A;  . PERCUTANEOUS CORONARY STENT INTERVENTION (PCI-S)  07/17/2014   Procedure: PERCUTANEOUS CORONARY STENT INTERVENTION (PCI-S);  Surgeon: Tyler Sine, MD;  Location: [facility] CATH LAB;  Service: Cardiovascular;;  . RIGHT/LEFT HEART CATH AND CORONARY ANGIOGRAPHY N/A 10/03/2018   Procedure: RIGHT/LEFT HEART CATH AND CORONARY ANGIOGRAPHY;  Surgeon: [name], MD;  Location: [facility] CV LAB;  Service: Cardiovascular;  Laterality: N/A;        Home Medications    Prior to Admission medications   Medication Sig Start Date End Date Taking? Authorizing Tyler Wong  albuterol (PROVENTIL HFA;VENTOLIN HFA) 108 (90 Base) MCG/ACT inhaler Inhale 2 puffs into the lungs every 4 (four) hours as needed for wheezing or shortness of breath. 10/06/18  Yes Tyler Donovan, Tyler Wong  aspirin 81 MG EC tablet Take 1 tablet (81 mg total) by mouth daily. 10/06/18  Yes Tyler Wong, Tyler Wong, Tyler Wong  atorvastatin (LIPITOR) 80 MG tablet Take 1 tablet (80 mg total) by mouth daily. 03/28/19  Yes Tyler Shore, NP  carvedilol (COREG)  3.125 MG tablet Take 1 tablet (3.125 mg total) by mouth 2 (two) times daily with a meal. 03/28/19 06/26/19 Yes Tyler Shore, NP  furosemide (LASIX) 40 MG tablet Take 1 tablet (40 mg total) by mouth 2 (two) times daily. 03/28/19  Yes Tyler Shore, NP  Multiple Vitamin (MULTIVITAMIN) tablet Take 1 tablet by mouth daily.   Yes Tyler Wong, Historical, MD  NITROSTAT 0.4 MG SL tablet PLACE 1 TABLET UNDER TONGUE AS NEEDED FOR CHEST  PAIN EVERY 5 MINUTES X 3 MAX DOSES.  CALL 911 IF PAIN PERSISTS Tyler Wong taking differently: Place 0.4 mg under the tongue every 5 (five) minutes as needed for chest pain.  02/10/18  Yes Tyler Sine, MD  sacubitril-valsartan (ENTRESTO) 24-26 MG Take 1 tablet by mouth 2 (two) times daily. 03/28/19  Yes Tyler Shore, NP  spironolactone (ALDACTONE) 25 MG tablet Take 0.5 tablets (12.5 mg total) by mouth daily for 30 days. Tyler Wong not taking: Reported on 05/04/2019 03/28/19 04/27/19  Tyler Shore, NP    Family History Family History  Problem Relation Age of Onset  . Cirrhosis Father   . Heart attack Mother   . Heart attack Sister   . Heart failure Sister   . Heart attack Brother     Social History Social History   Tobacco Use  . Smoking status: Current Every Day Smoker    Packs/day: 0.50    Years: 30.00    Pack years: 15.00    Types: Cigarettes  . Smokeless tobacco: Never Used  . Tobacco comment: .5 PK PER DAY  Substance Use Topics  . Alcohol use: No    Comment: Tyler Wong denies abuse, states social drinker  . Drug use: Yes    Types: Cocaine    Comment: heroin     Allergies   Tyler Wong has no known allergies.   Review of Systems Review of Systems  Constitutional: Negative for chills, diaphoresis and fever.  HENT: Negative for ear pain and sore throat.   Eyes: Negative for pain and visual disturbance.  Respiratory: Positive for shortness of breath. Negative for cough, sputum production and wheezing.   Cardiovascular: Positive for chest pain. Negative for palpitations and claudication.  Gastrointestinal: Negative for abdominal pain and vomiting.  Genitourinary: Negative for dysuria and hematuria.  Musculoskeletal: Negative for arthralgias, back pain and neck pain.  Skin: Negative for color change and rash.  Neurological: Negative for seizures, syncope and headaches.  All other systems reviewed and are negative.    Physical Exam Updated Vital Signs  ED Triage Vitals   Enc Vitals Group     BP 05/04/19 0957 (!) 131/96     Pulse Rate 05/04/19 0957 91     Resp 05/04/19 0957 20     Temp 05/04/19 0957 97.8 F (36.6 C)     Temp Source 05/04/19 0957 Oral     SpO2 05/04/19 0957 99 %     Weight 05/04/19 0957 172 lb (78 kg)     Height 05/04/19 0957 5' 10.5" (1.791 Wong)     Head Circumference --      Peak Flow --      Pain Score 05/04/19 0954 0     Pain Loc --      Pain Edu? --      Excl. in [place]? --     Physical Exam Vitals signs and nursing note reviewed.  Constitutional:      General: Tyler Wong is not in acute distress.    Appearance: Tyler Wong  is well-developed. Tyler Wong is not ill-appearing.  HENT:     Head: Normocephalic and atraumatic.  Eyes:     Extraocular Movements: Extraocular movements intact.     Conjunctiva/sclera: Conjunctivae normal.  Neck:     Musculoskeletal: Normal range of motion and neck supple.  Cardiovascular:     Rate and Rhythm: Normal rate and regular rhythm.     Heart sounds: No murmur.  Pulmonary:     Effort: Tachypnea present. No respiratory distress.     Breath sounds: Decreased breath sounds and rales present. No wheezing or rhonchi.  Abdominal:     Palpations: Abdomen is soft.     Tenderness: There is no abdominal tenderness.  Musculoskeletal:     Right lower leg: No edema.     Left lower leg: No edema.  Skin:    General: Skin is warm and dry.  Neurological:     General: No focal deficit present.     Mental Status: Tyler Wong is alert.  Psychiatric:        Mood and Affect: Mood normal.      ED Treatments / Results  Labs (all labs ordered are listed, but only abnormal results are displayed) Labs Reviewed  CBC WITH DIFFERENTIAL/PLATELET - Abnormal; Notable for the following components:      Result Value   RDW 15.8 (*)    All other components within normal limits  COMPREHENSIVE METABOLIC PANEL - Abnormal; Notable for the following components:   Glucose, Bld 123 (*)    BUN 26 (*)    Calcium 8.8 (*)    Albumin 3.1 (*)    All other  components within normal limits  BRAIN NATRIURETIC PEPTIDE - Abnormal; Notable for the following components:   B Natriuretic Peptide 1,206.6 (*)    All other components within normal limits  TROPONIN I - Abnormal; Notable for the following components:   Troponin I 0.07 (*)    All other components within normal limits  RAPID URINE DRUG SCREEN, HOSP PERFORMED - Abnormal; Notable for the following components:   Cocaine POSITIVE (*)    All other components within normal limits  SARS CORONAVIRUS 2 (HOSPITAL ORDER, [facility])  TROPONIN I    EKG EKG Interpretation  Date/Time:  Thursday May 04 2019 09:57:30 EDT Ventricular Rate:  90 PR Interval:    QRS Duration: 96 QT Interval:  408 QTC Calculation: 500 R Axis:   -77 Text Interpretation:  Sinus rhythm Biatrial enlargement RSR' in V1 or V2, probably normal variant Left ventricular hypertrophy Inferior infarct, old Anterior Q waves, possibly due to LVH Confirmed by Tyler Wong [phone]) on 05/04/2019 10:05:01 AM Also confirmed by Tyler Wong [phone]), editor Tyler Wong [phone])  on 05/04/2019 11:34:29 AM   Radiology Dg Chest Portable 1 View  Result Date: 05/04/2019 CLINICAL DATA:  Shortness of breath today EXAM: PORTABLE CHEST 1 VIEW COMPARISON:  02/22/2019 FINDINGS: Cardiomegaly.  Stable mediastinal contours. Unchanged streaky opacity over the right upper lobe. No edema, effusion, or pneumothorax. IMPRESSION: Cardiomegaly and right upper lobe scarring. No acute finding when compared to priors. Electronically Signed   By: Tyler Fantasia Wong.D.   On: 05/04/2019 10:16    Procedures Procedures (including critical care time)  Medications Ordered in ED Medications  furosemide (LASIX) injection 40 mg (has no administration in time range)  spironolactone (ALDACTONE) tablet 12.5 mg (has no administration in time range)  sacubitril-valsartan (ENTRESTO) 24-26 mg per tablet (has no administration in time range)  aspirin  chewable  tablet 324 mg (324 mg Oral Given 05/04/19 1134)  furosemide (LASIX) injection 40 mg (40 mg Intravenous Given 05/04/19 1251)     Initial Impression / Assessment and Plan / ED Course  I have reviewed the triage vital signs and the nursing notes.  Pertinent labs & imaging results that were available during my care of the Tyler Wong were reviewed by me and considered in my medical decision making (see chart for details).     Tyler Wong is a 53 year old male with history of heart failure, CAD, asthma who presents to the ED with shortness of breath for the last several days.  [facility]yler Wong [facility] for follow-up appointment but they sent him here for increased work of breathing.  Tyler Wong states that Tyler Wong has been without his spironolactone for several weeks.  Has been taking his Lasix as prescribed.  States that Tyler Wong has had gradual shortness of breath over the last several days with difficulty with exertion, laying flat.  Tyler Wong has increased work of breathing on exam.  Has some rales.  No peripheral edema.  Tyler Wong states she typically does not get fluid in his legs.  Had echocardiogram last year that had an EF of 20%.  Had heart cath that showed pulmonary hypertension.  Has a history of a stent.  States Tyler Wong had some chest pain yesterday but none currently.  EKG shows sinus rhythm.  No new ischemic changes.  Overall likely Tyler Wong has volume overload.  Will screen for coronavirus given the need that Tyler Wong will likely need to be admitted.  Vital signs are overall reassuring however does have tachypnea.  Chest x-ray fairly unchanged.  No major effusions, pneumothorax, pneumonia.  Tyler Wong positive for cocaine.  BNP is elevated at 1200.  Troponin elevated to 0.07.  Suspect that Tyler Wong with heart failure exacerbation in the setting of noncompliance and cocaine abuse.  Given IV Lasix.  Possibly also has vasospasm as well.  Cardiology consulted and will come down to the ED to evaluate  the Tyler Wong for admission.   This chart was dictated using voice recognition software.  Despite best efforts to proofread,  errors can occur which can change the documentation meaning.   Kemarion Abbey was evaluated in Emergency Department on 05/04/2019 for the symptoms described in the history of present illness. Tyler Wong was evaluated in the context of the global COVID-19 pandemic, which necessitated consideration that the Tyler Wong might be at risk for infection with the SARS-CoV-2 virus that causes COVID-19. Institutional protocols and algorithms that pertain to the evaluation of patients at risk for COVID-19 are in a state of rapid change based on information released by regulatory bodies including the CDC and federal and state organizations. These policies and algorithms were followed during the Tyler Wong's care in the ED.  Final Diagnosis   Final diagnoses:  Acute on chronic congestive heart failure, unspecified heart failure type [facility])  Cocaine abuse [facility])    ED Discharge Orders    None       Tyler Sites, DO 05/04/19 1358

## 2019-05-04 NOTE — Plan of Care (Signed)
  Problem: Clinical Measurements: Goal: Will remain free from infection Outcome: Progressing Goal: Respiratory complications will improve Outcome: Progressing   Problem: Activity: Goal: Risk for activity intolerance will decrease Outcome: Progressing   Problem: Elimination: Goal: Will not experience complications related to bowel motility Outcome: Progressing

## 2019-05-04 NOTE — ED Triage Notes (Signed)
Pt here for evaluation of sob x 4 days. Says [facility] for a routine appointment and they sent him here because of his trouble breathing.

## 2019-05-04 NOTE — ED Notes (Signed)
ED TO INPATIENT HANDOFF REPORT  ED Nurse Name and Phone #: [phone]  S Name/Age/Gender Tyler Wong 53 y.o. male Room/Bed: 025C/025C  Code Status   Code Status: Full Code  Home/SNF/Other Home Patient oriented to: self, place, time and situation Is this baseline? Yes   Triage Complete: Triage complete  Chief Complaint sob  Triage Note Pt here for evaluation of sob x 4 days. [facility] and they sent him here because of his trouble breathing.    Allergies No Known Allergies  Level of Care/Admitting Diagnosis ED Disposition    ED Disposition Condition Comment   Admit  Hospital Area: [place] [[id number]]  Level of Care: Telemetry Cardiac [103]  Covid Evaluation: N/A  Diagnosis: HFrEF (heart failure with reduced ejection fraction) [facility]) [[id number]]  Admitting Physician: Oda Kilts [[id number]]  Attending Physician: Oda Kilts [phone]  PT Class (Do Not Modify): Observation [104]  PT Acc Code (Do Not Modify): Observation [10022]       B Medical/Surgery History Past Medical History:  Diagnosis Date  . CAD in native artery 07/17/14   STEMI- LAD stenosis with DES  . CHF (congestive heart failure) ([place])   . Cocaine abuse ([place])   . COPD (chronic obstructive pulmonary disease) ([place])   . Dyspnea   . Hypertension   . ST elevation myocardial infarction (STEMI) involving left anterior descending (LAD) coronary artery with complication ([place])   . Tobacco abuse 07/17/2014   Past Surgical History:  Procedure Laterality Date  . CORONARY ANGIOPLASTY WITH STENT PLACEMENT  07/18/14   resolute DES to LAD STEMI  . LEFT HEART CATH N/A 07/17/2014   Procedure: LEFT HEART CATH;  Surgeon: [name], MD;  Location: [facility] CATH LAB;  Service: Cardiovascular;  Laterality: N/A;  . PERCUTANEOUS CORONARY STENT INTERVENTION (PCI-S)  07/17/2014   Procedure: PERCUTANEOUS CORONARY STENT INTERVENTION  (PCI-S);  Surgeon: [name], MD;  Location: [facility] CATH LAB;  Service: Cardiovascular;;  . RIGHT/LEFT HEART CATH AND CORONARY ANGIOGRAPHY N/A 10/03/2018   Procedure: RIGHT/LEFT HEART CATH AND CORONARY ANGIOGRAPHY;  Surgeon: [name], MD;  Location: [facility] CV LAB;  Service: Cardiovascular;  Laterality: N/A;     A IV Location/Drains/Wounds Patient Lines/Drains/Airways Status   Active Line/Drains/Airways    Name:   Placement date:   Placement time:   Site:   Days:   Peripheral IV 05/04/19 Left Antecubital   05/04/19    0959    Antecubital   less than 1          Intake/Output Last 24 hours No intake or output data in the 24 hours ending 05/04/19 1503  Labs/Imaging Results for orders placed or performed during the hospital encounter of 05/04/19 (from the past 48 hour(s))  CBC with Differential     Status: Abnormal   Collection Time: 05/04/19 10:06 AM  Result Value Ref Range   WBC 7.9 4.0 - 10.5 K/uL   RBC 4.90 4.22 - 5.81 MIL/uL   Hemoglobin 14.0 13.0 - 17.0 g/dL   HCT 42.4 39.0 - 52.0 %   MCV 86.5 80.0 - 100.0 fL   MCH 28.6 26.0 - 34.0 pg   MCHC 33.0 30.0 - 36.0 g/dL   RDW 15.8 (H) 11.5 - 15.5 %   Platelets 243 150 - 400 K/uL   nRBC 0.0 0.0 - 0.2 %   Neutrophils Relative % 72 %   Neutro Abs 5.7 1.7 -  7.7 K/uL   Lymphocytes Relative 14 %   Lymphs Abs 1.1 0.7 - 4.0 K/uL   Monocytes Relative 11 %   Monocytes Absolute 0.9 0.1 - 1.0 K/uL   Eosinophils Relative 2 %   Eosinophils Absolute 0.2 0.0 - 0.5 K/uL   Basophils Relative 1 %   Basophils Absolute 0.0 0.0 - 0.1 K/uL   Immature Granulocytes 0 %   Abs Immature Granulocytes 0.03 0.00 - 0.07 K/uL    Comment: Performed at [facility] [address], [place], [place] [address]  Comprehensive metabolic panel     Status: Abnormal   Collection Time: 05/04/19 10:06 AM  Result Value Ref Range   Sodium 140 135 - 145 mmol/L   Potassium 4.0 3.5 - 5.1 mmol/L   Chloride 105 98 - 111 mmol/L   CO2 23 22 - 32 mmol/L    Glucose, Bld 123 (H) 70 - 99 mg/dL   BUN 26 (H) 6 - 20 mg/dL   Creatinine, Ser 1.22 0.61 - 1.24 mg/dL   Calcium 8.8 (L) 8.9 - 10.3 mg/dL   Total Protein 6.5 6.5 - 8.1 g/dL   Albumin 3.1 (L) 3.5 - 5.0 g/dL   AST 16 15 - 41 U/L   ALT 19 0 - 44 U/L   Alkaline Phosphatase 83 38 - 126 U/L   Total Bilirubin 0.9 0.3 - 1.2 mg/dL   GFR calc non Af Amer >60 >60 mL/min   GFR calc Af Amer >60 >60 mL/min   Anion gap 12 5 - 15    Comment: Performed at [facility] [address], [place], [place] [address]  Brain natriuretic peptide     Status: Abnormal   Collection Time: 05/04/19 10:06 AM  Result Value Ref Range   B Natriuretic Peptide 1,206.6 (H) 0.0 - 100.0 pg/mL    Comment: Performed at [facility] [address] [place], [place] [address]  Troponin I - ONCE - STAT     Status: Abnormal   Collection Time: 05/04/19 10:06 AM  Result Value Ref Range   Troponin I 0.07 (HH) <0.03 ng/mL    Comment: CRITICAL RESULT CALLED TO, READ BACK BY AND VERIFIED WITH: Eara Burruel,S RN @ 9528 05/04/19 LEONARD,A Performed at [facility], [place] [address], [place], [place] [address]   SARS Coronavirus 2 (CEPHEID- Performed in [facility] hospital lab), Hosp Order     Status: None   Collection Time: 05/04/19 10:06 AM  Result Value Ref Range   SARS Coronavirus 2 NEGATIVE NEGATIVE    Comment: (NOTE) If result is NEGATIVE SARS-CoV-2 target nucleic acids are NOT DETECTED. The SARS-CoV-2 RNA is generally detectable in upper and lower  respiratory specimens during the acute phase of infection. The lowest  concentration of SARS-CoV-2 viral copies this assay can detect is 250  copies / mL. A negative result does not preclude SARS-CoV-2 infection  and should not be used as the sole basis for treatment or other  patient management decisions.  A negative result may occur with  improper specimen collection / handling, submission of specimen other  than nasopharyngeal swab, presence of viral  mutation(s) within the  areas targeted by this assay, and inadequate number of viral copies  (<250 copies / mL). A negative result must be combined with clinical  observations, patient history, and epidemiological information. If result is POSITIVE SARS-CoV-2 target nucleic acids are DETECTED. The SARS-CoV-2 RNA is generally detectable in upper and lower  respiratory specimens  dur ing the acute phase of infection.  Positive  results are indicative of active infection with SARS-CoV-2.  Clinical  correlation with patient history and other diagnostic information is  necessary to determine patient infection status.  Positive results do  not rule out bacterial infection or co-infection with other viruses. If result is PRESUMPTIVE POSTIVE SARS-CoV-2 nucleic acids MAY BE PRESENT.   A presumptive positive result was obtained on the submitted specimen  and confirmed on repeat testing.  While 2019 novel coronavirus  (SARS-CoV-2) nucleic acids may be present in the submitted sample  additional confirmatory testing may be necessary for epidemiological  and / or clinical management purposes  to differentiate between  SARS-CoV-2 and other Sarbecovirus currently known to infect humans.  If clinically indicated additional testing with an alternate test  methodology [phone]) is advised. The SARS-CoV-2 RNA is generally  detectable in upper and lower respiratory sp ecimens during the acute  phase of infection. The expected result is Negative. Fact Sheet for Patients:  StrictlyIdeas.no Fact Sheet for Healthcare Providers: BankingDealers.co.za This test is not yet approved or cleared by the [place] FDA and has been authorized for detection and/or diagnosis of SARS-CoV-2 by FDA under an Emergency Use Authorization (EUA).  This EUA will remain in effect (meaning this test can be used) for the duration of the COVID-19 declaration under Section 564(b)(1)  of the Act, 21 U.S.C. section 360bbb-3(b)(1), unless the authorization is terminated or revoked sooner. Performed at [facility], [place] [address], [place], [place] [address]   Rapid urine drug screen (hospital performed)     Status: Abnormal   Collection Time: 05/04/19 11:22 AM  Result Value Ref Range   Opiates NONE DETECTED NONE DETECTED   Cocaine POSITIVE (A) NONE DETECTED   Benzodiazepines NONE DETECTED NONE DETECTED   Amphetamines NONE DETECTED NONE DETECTED   Tetrahydrocannabinol NONE DETECTED NONE DETECTED   Barbiturates NONE DETECTED NONE DETECTED    Comment: (NOTE) DRUG SCREEN FOR MEDICAL PURPOSES ONLY.  IF CONFIRMATION IS NEEDED FOR ANY PURPOSE, NOTIFY LAB WITHIN 5 DAYS. LOWEST DETECTABLE LIMITS FOR URINE DRUG SCREEN Drug Class                     Cutoff (ng/mL) Amphetamine and metabolites    1000 Barbiturate and metabolites    200 Benzodiazepine                 502 Tricyclics and metabolites     300 Opiates and metabolites        300 Cocaine and metabolites        300 THC                            50 Performed at [facility], [place] [address], [place], [place] [address]    Dg Chest Portable 1 View  Result Date: 05/04/2019 CLINICAL DATA:  Shortness of breath today EXAM: PORTABLE CHEST 1 VIEW COMPARISON:  02/22/2019 FINDINGS: Cardiomegaly.  Stable mediastinal contours. Unchanged streaky opacity over the right upper lobe. No edema, effusion, or pneumothorax. IMPRESSION: Cardiomegaly and right upper lobe scarring. No acute finding when compared to priors. Electronically Signed   By: Monte Fantasia M.D.   On: 05/04/2019 10:16    Pending Labs Unresulted Labs (From admission, onward)    Start     Ordered   05/05/19 8786  Basic metabolic panel  Tomorrow morning,   R  05/04/19 1442   05/05/19 0500  CBC  Tomorrow morning,   R     05/04/19 1442   05/04/19 1343  Troponin I - ONCE - STAT  ONCE - STAT,   STAT     05/04/19 1343           Vitals/Pain Today's Vitals   05/04/19 1315 05/04/19 1330 05/04/19 1345 05/04/19 1400  BP: 119/87 113/83 107/82 112/88  Pulse: 90 79 75 79  Resp: (!) 25 (!) 23 (!) 21 20  Temp:      TempSrc:      SpO2: 97% 100% 98% 98%  Weight:      Height:      PainSc:        Isolation Precautions Droplet and Contact precautions  Medications Medications  furosemide (LASIX) injection 40 mg (has no administration in time range)  spironolactone (ALDACTONE) tablet 12.5 mg (has no administration in time range)  sacubitril-valsartan (ENTRESTO) 24-26 mg per tablet (has no administration in time range)  enoxaparin (LOVENOX) injection 40 mg (has no administration in time range)  acetaminophen (TYLENOL) tablet 650 mg (has no administration in time range)    Or  acetaminophen (TYLENOL) suppository 650 mg (has no administration in time range)  aspirin EC tablet 81 mg (has no administration in time range)  albuterol (VENTOLIN HFA) 108 (90 Base) MCG/ACT inhaler 2 puff (has no administration in time range)  atorvastatin (LIPITOR) tablet 80 mg (has no administration in time range)  aspirin chewable tablet 324 mg (324 mg Oral Given 05/04/19 1134)  furosemide (LASIX) injection 40 mg (40 mg Intravenous Given 05/04/19 1251)    Mobility walks Low fall risk   Focused Assessments Pulmonary Assessment Handoff:  Lung sounds: Bilateral Breath Sounds: Clear L Breath Sounds: Clear R Breath Sounds: Clear O2 Device: Room Air        R Recommendations: See Admitting Provider Note  Report given to:   Additional Notes:

## 2019-05-04 NOTE — H&P (Deleted)
Cardiology Consultation:   Patient ID: Tyler Wong MRN: [id number]; DOB: Apr 02, 1966  Admit date: 05/04/2019 Date of Consult: 05/04/2019  Primary Care Provider: Patient, No Pcp Per Primary Cardiologist: Shelva Majestic, MD  Primary Electrophysiologist:  None  Advanced Heart failure:  Dr. Aundra Dubin  CC: SOB   Patient Profile:   Tyler Wong is a 53 y.o. male with a hx of chronic systolic HF, CAD with MI 1856, DES to LAD, HTN, HLD, and cocaine abuse.  Has been using cocaine for 30 years, + tobacco who is being seen today for the evaluation of SOB at the request of Dr. Ronnald Nian.  History of Present Illness:   Tyler Wong with above hx including chronic systolic HF with echo 31/4/97 with EF 20-25%, mod MR, mod-severe TR.  CAD with MI 2015 and LAD stent and last cath 10/02/18 with 30% in stent restenosis of LAD, nondominant RCA providing a PLV with 70% stenosis, 90% ostial OM1 with planned medical therapy.  Elevated Lt heart filling pressure, preserved cardiac output, primarily pulmonary venous hypertension.  Plan had been to repeat echo in Jan 2020.  On coreg, entresto, ASA and lipitor.    Last seen with virtual visit 03/28/19   Today he has increased SOB and sent to ER. He has increasing SOB over last several days and unable to lie flat.  He did have some chest pain yesterday.  His SOB began 4 days ago.  Some cough but more allergy type.  No fever, no headache until mild one today.  No diarrhea.  He has not been able to get his spironolactone and has been out over 1 month.  He tries not to eat salt.  He feels he has fluid in his abd, not so much in his legs.  He stated he was 172 lbs today at home in March he was 176 lbs.   He continue to smoke tobacco 1 ppW and cocain once a month.     Na 140, k+ 4.0, BUN 26 Cr 1.22 Alb. 3.1  Troponin 0.07 WBC 7.9, hgb 14, Hct 42.4  PCXR;   Cardiomegaly and right upper lobe scarring. No acute finding when compared to priors  COVID test not yet back   EKG:  The EKG  was personally reviewed and demonstrates:  SR with bi-atrial enlargement and prior R wave progression, and LVH but no acute changes. Telemetry:  Telemetry was personally reviewed and demonstrates:  SR  Currently is to receive lasix 40 mg once IV.  He is SOB and wants the lasix.  Past Medical History:  Diagnosis Date  . CAD in native artery 07/17/14   STEMI- LAD stenosis with DES  . CHF (congestive heart failure) ([place])   . Cocaine abuse ([place])   . COPD (chronic obstructive pulmonary disease) ([place])   . Dyspnea   . Hypertension   . ST elevation myocardial infarction (STEMI) involving left anterior descending (LAD) coronary artery with complication ([place])   . Tobacco abuse 07/17/2014    Past Surgical History:  Procedure Laterality Date  . CORONARY ANGIOPLASTY WITH STENT PLACEMENT  07/18/14   resolute DES to LAD STEMI  . LEFT HEART CATH N/A 07/17/2014   Procedure: LEFT HEART CATH;  Surgeon: [name], MD;  Location: [facility] CATH LAB;  Service: Cardiovascular;  Laterality: N/A;  . PERCUTANEOUS CORONARY STENT INTERVENTION (PCI-S)  07/17/2014   Procedure: PERCUTANEOUS CORONARY STENT INTERVENTION (PCI-S);  Surgeon: [name], MD;  Location: [facility] CATH LAB;  Service: Cardiovascular;;  . RIGHT/LEFT HEART  CATH AND CORONARY ANGIOGRAPHY N/A 10/03/2018   Procedure: RIGHT/LEFT HEART CATH AND CORONARY ANGIOGRAPHY;  Surgeon: [name], MD;  Location: [place] CV LAB;  Service: Cardiovascular;  Laterality: N/A;     Home Medications:  Prior to Admission medications   Medication Sig Start Date End Date Taking? Authorizing Provider  albuterol (PROVENTIL HFA;VENTOLIN HFA) 108 (90 Base) MCG/ACT inhaler Inhale 2 puffs into the lungs every 4 (four) hours as needed for wheezing or shortness of breath. 10/06/18   Argentina Donovan, PA-C  aspirin 81 MG EC tablet Take 1 tablet (81 mg total) by mouth daily. 10/06/18   Argentina Donovan, PA-C  atorvastatin (LIPITOR) 80 MG tablet Take 1 tablet (80 mg total)  by mouth daily. 03/28/19   Georgiana Shore, NP  carvedilol (COREG) 3.125 MG tablet Take 1 tablet (3.125 mg total) by mouth 2 (two) times daily with a meal. 03/28/19 06/26/19  Georgiana Shore, NP  furosemide (LASIX) 40 MG tablet Take 1 tablet (40 mg total) by mouth 2 (two) times daily. 03/28/19   Georgiana Shore, NP  Multiple Vitamin (MULTIVITAMIN) tablet Take 1 tablet by mouth daily.    [provider]  NITROSTAT 0.4 MG SL tablet PLACE 1 TABLET UNDER TONGUE AS NEEDED FOR CHEST PAIN EVERY 5 MINUTES X 3 MAX DOSES.  CALL 911 IF PAIN PERSISTS Patient taking differently: Place 0.4 mg under the tongue every 5 (five) minutes as needed for chest pain.  02/10/18   [name], MD  sacubitril-valsartan (ENTRESTO) 24-26 MG Take 1 tablet by mouth 2 (two) times daily. 03/28/19   Georgiana Shore, NP  spironolactone (ALDACTONE) 25 MG tablet Take 0.5 tablets (12.5 mg total) by mouth daily for 30 days. 03/28/19 04/27/19  Georgiana Shore, NP    Inpatient Medications: Scheduled Meds:  Continuous Infusions:  PRN Meds:   Allergies:   No Known Allergies  Social History:   Social History   Socioeconomic History  . Marital status: Single    Spouse name: Not on file  . Number of children: 2  . Years of education: 5  . Highest education level: 12th grade  Occupational History  . Occupation: Landscaping  Social Needs  . Financial resource strain: Very hard  . Food insecurity:    Worry: Often true    Inability: Often true  . Transportation needs:    Medical: Yes    Non-medical: Yes  Tobacco Use  . Smoking status: Current Every Day Smoker    Packs/day: 0.50    Years: 30.00    Pack years: 15.00    Types: Cigarettes  . Smokeless tobacco: Never Used  . Tobacco comment: .5 PK PER DAY  Substance and Sexual Activity  . Alcohol use: No    Comment: Patient denies abuse, states social drinker  . Drug use: Yes    Types: Cocaine    Comment: heroin  . Sexual activity: Yes  Lifestyle  . Physical  activity:    Days per week: Not on file    Minutes per session: Not on file  . Stress: Not on file  Relationships  . Social connections:    Talks on phone: Not on file    Gets together: Not on file    Attends religious service: Not on file    Active member of club or organization: Not on file    Attends meetings of clubs or organizations: Not on file    Relationship status: Not on file  .  Intimate partner violence:    Fear of current or ex partner: Not on file    Emotionally abused: Not on file    Physically abused: Not on file    Forced sexual activity: Not on file  Other Topics Concern  . Not on file  Social History Narrative   Lives in [place] with his sister and girlfriend. He works Aeronautical engineer work.     Family History:    Family History  Problem Relation Age of Onset  . Cirrhosis Father   . Heart attack Mother   . Heart attack Sister   . Heart failure Sister   . Heart attack Brother      ROS:  Please see the history of present illness.  General:no colds or fevers, no weight changes Skin:no rashes or ulcers HEENT:no blurred vision, no congestion CV:see HPI PUL:see HPI GI:no diarrhea constipation or melena, no indigestion GU:no hematuria, no dysuria MS:no joint pain, no claudication Neuro:no syncope, no lightheadedness Endo:no diabetes, no thyroid disease  All other ROS reviewed and negative.     Physical Exam/Data:   Vitals:   05/04/19 1030 05/04/19 1100 05/04/19 1115 05/04/19 1130  BP: 111/86 112/90 (!) 117/93 118/81  Pulse: 87 89 91 97  Resp: (!) 28 (!) 28 (!) 28 (!) 24  Temp:      TempSrc:      SpO2: 98% 99% 99% 100%  Weight:      Height:       No intake or output data in the 24 hours ending 05/04/19 1147 Last 3 Weights 05/04/2019 02/28/2019 02/22/2019  Weight (lbs) 172 lb 176 lb 3.2 oz 176 lb  Weight (kg) 78.019 kg 79.924 kg 79.833 kg     Body mass index is 24.33 kg/m.  Per Dr. Meda Coffee General:  Well nourished, well developed,  in no acute distress HEENT: normal Lymph: no adenopathy Neck: no JVD Endocrine:  No thryomegaly Vascular: No carotid bruits; FA pulses 2+ bilaterally without bruits  Cardiac:  normal S1, S2; +S4, RRR; 2/6 systolic murmur Lungs:  minimal crackles at bases B/L, no wheezing, rhonchi or rales  Abd: increased abdominal girth, no hepatomegaly  Ext: no edema Musculoskeletal:  No deformities, BUE and BLE strength normal and equal Skin: warm and dry  Neuro:  CNs 2-12 intact, no focal abnormalities noted Psych:  Normal affect   Relevant CV Studies: Echo 10/02/18 Study Conclusions  - Left ventricle: The cavity size was severely dilated. Systolic   function was severely reduced. The estimated ejection fraction   was in the range of 20% to 25%. Diffuse hypokinesis. Akinesis of   the apical myocardium. Doppler parameters are consistent with a   reversible restrictive pattern, indicative of decreased left   ventricular diastolic compliance and/or increased left atrial   pressure (grade 3 diastolic dysfunction). - Aortic valve: There was trivial regurgitation. - Mitral valve: Calcified annulus. Mildly thickened leaflets . The   findings are consistent with mild stenosis. There was moderate   regurgitation. - Left atrium: The atrium was severely dilated. - Right ventricle: The cavity size was moderately dilated. Wall   thickness was normal. - Right atrium: The atrium was severely dilated. - Atrial septum: No defect or patent foramen ovale was identified. - Tricuspid valve: There was moderate-severe regurgitation. - Pulmonary arteries: Systolic pressure was moderately increased. - Pericardium, extracardiac: A trivial pericardial effusion was   identified. There was a left pleural effusion.  Impressions: - 1. LV EF severely reduced with ventricular dilation. Wall  motion   appears diffusely worse at apex, involving dyskinesis of inferior   apex and severe hypokinesis/akinesis of the anterior,    anterolateral, inferolateral, and inferoseptal apex.     2. At least moderate MR due to functional noncoaptation of the   leaflets.     3. Moderate-severe TR with elevated right sided pressures.     4. Trivial pericardial effusion.     Overall findings are similar to prior, with slightly worse   function but similar pattern.  Rt and Lt cardiac cath 10/03/18 . Elevated left heart filling pressure (PCWP and LVEDP).  2. Preserved cardiac output.  3. Primarily pulmonary venous hypertension.  4. Nondominant RCA providing a PLV with 70% stenosis.  90% ostial moderate OM1.  Will plan medical management of coronary disease.  No chest pain and does not explain fall in EF.   Laboratory Data:  Chemistry Recent Labs  Lab 05/04/19 1006  NA 140  K 4.0  CL 105  CO2 23  GLUCOSE 123*  BUN 26*  CREATININE 1.22  CALCIUM 8.8*  GFRNONAA >60  GFRAA >60  ANIONGAP 12    Recent Labs  Lab 05/04/19 1006  PROT 6.5  ALBUMIN 3.1*  AST 16  ALT 19  ALKPHOS 83  BILITOT 0.9   Hematology Recent Labs  Lab 05/04/19 1006  WBC 7.9  RBC 4.90  HGB 14.0  HCT 42.4  MCV 86.5  MCH 28.6  MCHC 33.0  RDW 15.8*  PLT 243   Cardiac Enzymes Recent Labs  Lab 05/04/19 1006  TROPONINI 0.07*   No results for input(s): TROPIPOC in the last 168 hours.  BNPNo results for input(s): BNP, PROBNP in the last 168 hours.  DDimer No results for input(s): DDIMER in the last 168 hours.  Radiology/Studies:  Dg Chest Portable 1 View  Result Date: 05/04/2019 CLINICAL DATA:  Shortness of breath today EXAM: PORTABLE CHEST 1 VIEW COMPARISON:  02/22/2019 FINDINGS: Cardiomegaly.  Stable mediastinal contours. Unchanged streaky opacity over the right upper lobe. No edema, effusion, or pneumothorax. IMPRESSION: Cardiomegaly and right upper lobe scarring. No acute finding when compared to priors. Electronically Signed   By: Monte Fantasia M.D.   On: 05/04/2019 10:16    Assessment and Plan:   1. Acute on chronic  systolic HF with EF 11-91%.   1. Continue IV lasix 40 mg BID.  2. RESTART SPIRONOLACTONE 12.5 MG PO DAILY 3. Continue Entresto 4. repeat echocardiogram   5. Hold carvedilol in the settings of acute CHF 2. CAD with patent LAD stent and RCA disease non dominant. No chest pain. 3. Tobacco use has decreased,   4. Cocaine abuse - needs to stop. 5. COVID test neg.  We will follow  For questions or updates, please contact [place] Please consult [email or web address] for contact info under   Signed, Ena Dawley, MD 05/04/2019 11:47 AM

## 2019-05-05 ENCOUNTER — Observation Stay (HOSPITAL_BASED_OUTPATIENT_CLINIC_OR_DEPARTMENT_OTHER): Payer: Medicaid Other

## 2019-05-05 ENCOUNTER — Telehealth (HOSPITAL_COMMUNITY): Payer: Self-pay | Admitting: Surgery

## 2019-05-05 DIAGNOSIS — Z9119 Patient's noncompliance with other medical treatment and regimen: Secondary | ICD-10-CM | POA: Diagnosis not present

## 2019-05-05 DIAGNOSIS — I5023 Acute on chronic systolic (congestive) heart failure: Secondary | ICD-10-CM | POA: Diagnosis present

## 2019-05-05 DIAGNOSIS — J449 Chronic obstructive pulmonary disease, unspecified: Secondary | ICD-10-CM | POA: Diagnosis present

## 2019-05-05 DIAGNOSIS — I313 Pericardial effusion (noninflammatory): Secondary | ICD-10-CM | POA: Diagnosis present

## 2019-05-05 DIAGNOSIS — B1920 Unspecified viral hepatitis C without hepatic coma: Secondary | ICD-10-CM | POA: Diagnosis present

## 2019-05-05 DIAGNOSIS — Z1159 Encounter for screening for other viral diseases: Secondary | ICD-10-CM | POA: Diagnosis not present

## 2019-05-05 DIAGNOSIS — Z7982 Long term (current) use of aspirin: Secondary | ICD-10-CM | POA: Diagnosis not present

## 2019-05-05 DIAGNOSIS — Z7151 Drug abuse counseling and surveillance of drug abuser: Secondary | ICD-10-CM | POA: Diagnosis not present

## 2019-05-05 DIAGNOSIS — I5043 Acute on chronic combined systolic (congestive) and diastolic (congestive) heart failure: Secondary | ICD-10-CM | POA: Diagnosis present

## 2019-05-05 DIAGNOSIS — Z79899 Other long term (current) drug therapy: Secondary | ICD-10-CM | POA: Diagnosis not present

## 2019-05-05 DIAGNOSIS — I110 Hypertensive heart disease with heart failure: Secondary | ICD-10-CM | POA: Diagnosis present

## 2019-05-05 DIAGNOSIS — R0602 Shortness of breath: Secondary | ICD-10-CM | POA: Diagnosis not present

## 2019-05-05 DIAGNOSIS — F17210 Nicotine dependence, cigarettes, uncomplicated: Secondary | ICD-10-CM | POA: Diagnosis present

## 2019-05-05 DIAGNOSIS — I081 Rheumatic disorders of both mitral and tricuspid valves: Secondary | ICD-10-CM | POA: Diagnosis present

## 2019-05-05 DIAGNOSIS — F1190 Opioid use, unspecified, uncomplicated: Secondary | ICD-10-CM | POA: Diagnosis present

## 2019-05-05 DIAGNOSIS — Z8249 Family history of ischemic heart disease and other diseases of the circulatory system: Secondary | ICD-10-CM | POA: Diagnosis not present

## 2019-05-05 DIAGNOSIS — I340 Nonrheumatic mitral (valve) insufficiency: Secondary | ICD-10-CM

## 2019-05-05 DIAGNOSIS — Z9114 Patient's other noncompliance with medication regimen: Secondary | ICD-10-CM | POA: Diagnosis not present

## 2019-05-05 DIAGNOSIS — I252 Old myocardial infarction: Secondary | ICD-10-CM | POA: Diagnosis not present

## 2019-05-05 DIAGNOSIS — I361 Nonrheumatic tricuspid (valve) insufficiency: Secondary | ICD-10-CM

## 2019-05-05 DIAGNOSIS — G4700 Insomnia, unspecified: Secondary | ICD-10-CM | POA: Diagnosis present

## 2019-05-05 DIAGNOSIS — I2510 Atherosclerotic heart disease of native coronary artery without angina pectoris: Secondary | ICD-10-CM | POA: Diagnosis present

## 2019-05-05 DIAGNOSIS — Z716 Tobacco abuse counseling: Secondary | ICD-10-CM | POA: Diagnosis not present

## 2019-05-05 DIAGNOSIS — F1490 Cocaine use, unspecified, uncomplicated: Secondary | ICD-10-CM | POA: Diagnosis present

## 2019-05-05 DIAGNOSIS — I248 Other forms of acute ischemic heart disease: Secondary | ICD-10-CM | POA: Diagnosis present

## 2019-05-05 DIAGNOSIS — Z955 Presence of coronary angioplasty implant and graft: Secondary | ICD-10-CM | POA: Diagnosis not present

## 2019-05-05 DIAGNOSIS — E785 Hyperlipidemia, unspecified: Secondary | ICD-10-CM | POA: Diagnosis present

## 2019-05-05 LAB — BASIC METABOLIC PANEL
Anion gap: 12 (ref 5–15)
BUN: 24 mg/dL — ABNORMAL HIGH (ref 6–20)
CO2: 27 mmol/L (ref 22–32)
Calcium: 8.6 mg/dL — ABNORMAL LOW (ref 8.9–10.3)
Chloride: 100 mmol/L (ref 98–111)
Creatinine, Ser: 1.3 mg/dL — ABNORMAL HIGH (ref 0.61–1.24)
GFR calc Af Amer: >60 mL/min (ref >60)
GFR calc non Af Amer: >60 mL/min (ref >60)
Glucose, Bld: 116 mg/dL — ABNORMAL HIGH (ref 70–99)
Potassium: 3.8 mmol/L (ref 3.5–5.1)
Sodium: 139 mmol/L (ref 135–145)

## 2019-05-05 LAB — ECHOCARDIOGRAM LIMITED
Height: 70.5 in
Weight: 2686.4 oz

## 2019-05-05 LAB — CBC
HCT: 43 % (ref 39.0–52.0)
Hemoglobin: 14.4 g/dL (ref 13.0–17.0)
MCH: 28.2 pg (ref 26.0–34.0)
MCHC: 33.5 g/dL (ref 30.0–36.0)
MCV: 84.3 fL (ref 80.0–100.0)
Platelets: 243 10*3/uL (ref 150–400)
RBC: 5.1 MIL/uL (ref 4.22–5.81)
RDW: 15.5 % (ref 11.5–15.5)
WBC: 6.4 10*3/uL (ref 4.0–10.5)
nRBC: 0 % (ref 0.0–0.2)

## 2019-05-05 MED ORDER — FUROSEMIDE 40 MG PO TABS
40.0000 mg | ORAL_TABLET | Freq: Two times a day (BID) | ORAL | Status: DC
  Administered 2019-05-05 – 2019-05-06 (×2): 40 mg via ORAL
  Filled 2019-05-05 (×2): qty 1

## 2019-05-05 NOTE — Progress Notes (Signed)
   Subjective: No overnight events. Patient seen walking around the hallway this morning. He states he likes to move to help get the fluid off. He reports he feels better this morning without dyspnea or abdominal swelling. He is motivated to stop using cocaine and discusses the risks/benefits of his cocaine use with the team with good insight. He states he was supposed to establish care with a [facility] PCP, but he doesn't have a phone and all visits have been tele-health recently. All questions addressed.   Objective:  Vital signs in last 24 hours: Vitals:   05/04/19 1900 05/05/19 0033 05/05/19 0145 05/05/19 0421  BP: 96/67 97/69 (!) 142/65 102/77  Pulse: 76 83 62 90  Resp: 18 18 18 18   Temp: (!) 97 F (36.1 C) 97.9 F (36.6 C) 98.3 F (36.8 C) 98.5 F (36.9 C)  TempSrc: Oral Oral Oral Oral  SpO2: 96% 100% 97% 100%  Weight:  76.2 kg    Height:       Gen: alert and oriented, no distress CV: RRR, systolic murmur, no JVD Pulm: CTAB, no crackles, normal effort on room air Abd: bs+, soft, non-distended, non-tender Ext: no edema   ECHO - EF 35-40% - Diffuse LV hypokinesis - Trivial pericardial effusion - Moderate MV regurg with mild thickening of the MV leaflet - Moderate tricuspid regurg  Assessment/Plan:  Active Problems:   HFrEF (heart failure with reduced ejection fraction) [facility])  Mr. Markwood is 53 yo man with a medical history of systolic heart failure (EF 20-25% in 2019), CAD with MI and DES to LAD in 2015, HTN, HLD, and cocaine use disorder who presented with dyspnea and abdominal swelling consistent with heart failure exacerbation. He is hemodynamically stable and satting well on room air.   Acute on chronic systolic heart failure exacerbation HTN CAD  In the setting of cocaine use and medication noncompliance (has been without spironolactone for a month).  Mildly elevated troponin, likely demand ischemia. Diuresed 3L after 40mg  IV lasix x2 yesterday, total net negative 2.2L.  Weight stable at 167lbs today. Appears euvolemic on exam today. ECHO showed improved EF from 2019 study, increasing from 20-25% to 35-40% with persistent diffuse hypokinesis. MV and tricuspid valve regurg still present.  - Cards following, appreciate recs - IV lasix 40mg  BID - Continue home aspirin, entresto, and spironolactone - Holding home coreg - Daily weights - Strict I/Os - Tele  HLD: continue home lipitor  Dispo: Anticipated discharge in approximately 1 day   Dorrell, Andree Elk, MD 05/05/2019, 6:20 AM Pager: [phone]

## 2019-05-05 NOTE — TOC Initial Note (Signed)
Transition of Care [facility]) - Initial/Assessment Note    Patient Details  Name: Tyler Wong MRN: [id number] Date of Birth: 20-May-1966  Transition of Care [facility]) CM/SW Contact:    Sherrilyn Rist Phone Number: [phone] 05/05/2019, 12:49 PM  Clinical Narrative:                 Patient lives with his girlfriend and child; no PCP, he is agreeable to go to the [facility] for primary care. No medical insurance; Financial counselor to see patient to determine what he might qualify for. Patient is independent prior to admission; CM will continue to follow for progression of care.  Expected Discharge Plan: Home/Self Care Barriers to Discharge: No Barriers Identified   Patient Goals and CMS Choice Patient states their goals for this hospitalization and ongoing recovery are:: to stay at home CMS Medicare.gov Compare Post Acute Care list provided to:: Patient    Expected Discharge Plan and Services Expected Discharge Plan: Home/Self Care In-house Referral: Financial Counselor Discharge Planning Services: CM Consult, [place] arrangements for the past 2 months: Single Family Home                 DME Arranged: N/A DME Agency: NA       HH Arranged: NA HH Agency: NA        Prior Living Arrangements/Services Living arrangements for the past 2 months: Single Family Home Lives with:: Significant Other Patient language and need for interpreter reviewed:: Yes Do you feel safe going back to the place where you live?: Yes      Need for Family Participation in Patient Care: No (Comment) Care giver support system in place?: Yes (comment)   Criminal Activity/Legal Involvement Pertinent to Current Situation/Hospitalization: No - Comment as needed  Activities of Daily Living      Permission Sought/Granted Permission sought to share information with : Case Manager Permission granted to share information with : Yes, Verbal Permission Granted               Emotional Assessment Appearance:: Developmentally appropriate Attitude/Demeanor/Rapport: Gracious Affect (typically observed): Accepting Orientation: : Oriented to Self, Oriented to  Time, Oriented to Place, Oriented to Situation Alcohol / Substance Use: Illicit Drugs Psych Involvement: No (comment)  Admission diagnosis:  Cocaine abuse (HCC) [F14.10] Acute on chronic congestive heart failure, unspecified heart failure type [name]) [I50.9] Patient Active Problem List   Diagnosis Date Noted  . HFrEF (heart failure with reduced ejection fraction) ([place]) 05/04/2019  . Acute combined systolic and diastolic congestive heart failure ([place]) 10/02/2018  . Hepatitis C 04/13/2017  . Current non-adherence to medical treatment 04/13/2017  . Acute on chronic combined systolic (congestive) and diastolic (congestive) heart failure ([place]) 04/13/2017  . HTN (hypertension) 04/12/2017  . Insomnia 01/18/2017  . Chronic combined systolic and diastolic congestive heart failure ([place]) 01/04/2017  . Hyperlipidemia 01/04/2017  . Chest pain 12/30/2016  . CAD S/P percutaneous coronary angioplasty 07/18/2014  . Tobacco abuse 07/17/2014  . Cocaine use 07/17/2014   PCP:  Patient, No Pcp Per Pharmacy:   [place], [place] - [address] [place] [address] Phone: [phone] Fax: [place], [place] - [address] [facility] [place] [address] Phone: [phone] Fax: [phone]  [facility], [place], [place] [address], [place] [address] Phone: [phone] Fax:  [phone]     Social Determinants of Health (SDOH) Interventions    Readmission Risk Interventions No flowsheet data found.

## 2019-05-05 NOTE — TOC Initial Note (Signed)
Transition of Care [facility]) - Initial/Assessment Note    Patient Details  Name: Tyler Wong MRN: [id number] Date of Birth: [birth year]  Transition [facility]) CM/SW Contact:    Candie Chroman, LCSW Phone Number: 05/05/2019, 12:03 PM  Clinical Narrative:  CSW met with patient, introduced role, and inquired about substance abuse treatment interest. Patient agreeable to taking list of local inpatient and outpatient options. He also inquired about getting set back up with the paramedicine program. Patient lost his phone so he believes this is why did did not follow up after that. CSW called Daphne with the heart failure clinic. She will notify the heart failure social workers of patient's continued interest. She stated that since he doesn't have a phone they might be able to just show up at his house. Patient stated he may be able to buy a new phone. This CSW provided him information on getting an [employer] phone if needed. Also provided booklets for food pantries and free meals and list for emergency assistance needs. Patient has someone that can drive him if needed 1-2 times per week. His Medicaid is currently pending. CSW encouraged him to sign up for Medicaid transportation through DSS once he has been approved. No further concerns. CSW signing off as social work intervention is no longer needed.         Expected Discharge Plan: Home/Self Care Barriers to Discharge: Continued Medical Work up   Patient Goals and CMS Choice        Expected Discharge Plan and Services Expected Discharge Plan: Home/Self Care       Living arrangements for the past 2 months: Single Family Home                                      Prior Living Arrangements/Services Living arrangements for the past 2 months: Single Family Home   Patient language and need for interpreter reviewed:: No Do you feel safe going back to the place where you live?: Yes      Need for Family Participation in Patient Care: No  (Comment) Care giver support system in place?: No (comment)   Criminal Activity/Legal Involvement Pertinent to Current Situation/Hospitalization: No - Comment as needed  Activities of Daily Living      Permission Sought/Granted Permission sought to share information with : Other (comment)(Heart failure team)                Emotional Assessment Appearance:: Appears stated age Attitude/Demeanor/Rapport: Engaged, Gracious Affect (typically observed): Accepting, Appropriate, Calm, Pleasant Orientation: : Oriented to Self, Oriented to Place, Oriented to  Time, Oriented to Situation Alcohol / Substance Use: Illicit Drugs, Tobacco Use Psych Involvement: No (comment)  Admission diagnosis:  Cocaine abuse (HCC) [F14.10] Acute on chronic congestive heart failure, unspecified heart failure type [facility]) [I50.9] Patient Active Problem List   Diagnosis Date Noted  . HFrEF (heart failure with reduced ejection fraction) ([place]) 05/04/2019  . Acute combined systolic and diastolic congestive heart failure ([place]) 10/02/2018  . Hepatitis C 04/13/2017  . Current non-adherence to medical treatment 04/13/2017  . Acute on chronic combined systolic (congestive) and diastolic (congestive) heart failure ([place]) 04/13/2017  . HTN (hypertension) 04/12/2017  . Insomnia 01/18/2017  . Chronic combined systolic and diastolic congestive heart failure ([place]) 01/04/2017  . Hyperlipidemia 01/04/2017  . Chest pain 12/30/2016  . CAD S/P percutaneous coronary angioplasty 07/18/2014  . Tobacco abuse 07/17/2014  .  Cocaine use 07/17/2014   PCP:  Patient, No Pcp Per Pharmacy:   [place], [place] - [address] [place] [address] Phone: [phone] Fax: [place], [place] - [address] [facility]. [address] [place] [address] Phone: [phone] Fax: [phone]  [facility], [place], [place] [address] [place] [address] Phone: [phone] Fax: [phone]     Social Determinants of Health (SDOH) Interventions    Readmission Risk Interventions No flowsheet data found.

## 2019-05-05 NOTE — Progress Notes (Signed)
Progress Note  Patient Name: Tyler Wong Date of Encounter: 05/05/2019  Primary Cardiologist: Shelva Majestic, MD  Heart failure cardiologist- Dr. Loralie Champagne   Subjective   Feeling better this morning after diuresing 2.3 L with IV Lasix overnight.  Inpatient Medications    Scheduled Meds: . aspirin EC  81 mg Oral Daily  . atorvastatin  80 mg Oral Daily  . enoxaparin (LOVENOX) injection  40 mg Subcutaneous Q24H  . furosemide  40 mg Intravenous BID  . sacubitril-valsartan  1 tablet Oral BID  . spironolactone  12.5 mg Oral Daily   Continuous Infusions:  PRN Meds: acetaminophen **OR** acetaminophen, albuterol   Vital Signs    Vitals:   05/05/19 0033 05/05/19 0145 05/05/19 0421 05/05/19 0733  BP: 97/69 (!) 142/65 102/77 111/85  Pulse: 83 62 90 96  Resp: 18 18 18 18   Temp: 97.9 F (36.6 C) 98.3 F (36.8 C) 98.5 F (36.9 C) 98.3 F (36.8 C)  TempSrc: Oral Oral Oral Oral  SpO2: 100% 97% 100% 99%  Weight: 76.2 kg     Height:        Intake/Output Summary (Last 24 hours) at 05/05/2019 0951 Last data filed at 05/05/2019 0901 Gross per 24 hour  Intake 1025 ml  Output 3325 ml  Net -2300 ml   Last 3 Weights 05/05/2019 05/04/2019 05/04/2019  Weight (lbs) 167 lb 14.4 oz 167 lb 12.8 oz 172 lb  Weight (kg) 76.159 kg 76.114 kg 78.019 kg      Telemetry    Normal sinus rhythm- Personally Reviewed  ECG    None today- Personally Reviewed  Physical Exam   GEN: No acute distress.   Neck: No JVD Cardiac: RRR, no murmurs, rubs, or gallops.  Respiratory: Clear to auscultation bilaterally. GI: Soft, nontender, non-distended  MS: No edema; No deformity. Neuro:  Nonfocal  Psych: Normal affect   Labs    Chemistry Recent Labs  Lab 05/04/19 1006 05/05/19 0245  NA 140 139  K 4.0 3.8  CL 105 100  CO2 23 27  GLUCOSE 123* 116*  BUN 26* 24*  CREATININE 1.22 1.30*  CALCIUM 8.8* 8.6*  PROT 6.5  --   ALBUMIN 3.1*  --   AST 16  --   ALT 19  --   ALKPHOS 83  --   BILITOT  0.9  --   GFRNONAA >60 >60  GFRAA >60 >60  ANIONGAP 12 12     Hematology Recent Labs  Lab 05/04/19 1006 05/05/19 0245  WBC 7.9 6.4  RBC 4.90 5.10  HGB 14.0 14.4  HCT 42.4 43.0  MCV 86.5 84.3  MCH 28.6 28.2  MCHC 33.0 33.5  RDW 15.8* 15.5  PLT 243 243    Cardiac Enzymes Recent Labs  Lab 05/04/19 1006 05/04/19 1437 05/04/19 2131  TROPONINI 0.07* 0.07* 0.06*   No results for input(s): TROPIPOC in the last 168 hours.   BNP Recent Labs  Lab 05/04/19 1006  BNP 1,206.6*     DDimer No results for input(s): DDIMER in the last 168 hours.   Radiology    Dg Chest Portable 1 View  Result Date: 05/04/2019 CLINICAL DATA:  Shortness of breath today EXAM: PORTABLE CHEST 1 VIEW COMPARISON:  02/22/2019 FINDINGS: Cardiomegaly.  Stable mediastinal contours. Unchanged streaky opacity over the right upper lobe. No edema, effusion, or pneumothorax. IMPRESSION: Cardiomegaly and right upper lobe scarring. No acute finding when compared to priors. Electronically Signed   By: Monte Fantasia M.D.   On:  05/04/2019 10:16    Cardiac Studies   2D echo pending  Patient Profile     Tyler Wong is a 53 y.o. male with a hx of chronic systolic HF, CAD with MI 1610, DES to LAD, HTN, HLD, and cocaine abuse.  Has been using cocaine for 30 years, + tobacco who we were asked to see because of volume overload and systolic heart failure most likely due to medication noncompliance.  Assessment & Plan    1: Acute on chronic systolic heart failure- EF in the 20 to 25% range on appropriate pharmacology as an outpatient including Entresto, carvedilol, Lasix and spironolactone.  He ran out of his diuretic a month ago and was admitted with volume overload and heart failure.  He has diuresed 2.3 L and IV diuretics.  Can probably transition back to spironolactone and anticipate discharge in next 24 to 48 hours.  2: Coronary artery disease- history of CAD status post remote LAD stent with RCA disease that was  nondominant.  He denies chest pain.  3: Tobacco abuse- continues to smoke, counseled about the importance of smoking cessation  4: Cocaine abuse- counseled about the importance of stopping  Discussed case with Dr. Aundra Dubin who is going to assume his care.     For questions or updates, please contact [place] Please consult [email or web address] for contact info under        Signed, Quay Burow, MD  05/05/2019, 9:51 AM

## 2019-05-05 NOTE — Progress Notes (Signed)
  Echocardiogram 2D Echocardiogram has been performed.  Tyler Wong 05/05/2019, 8:27 AM

## 2019-05-05 NOTE — Telephone Encounter (Signed)
Referral received for HF [facility].  I have sent all appropriate paperwork via secure email to the paramedic team.

## 2019-05-05 NOTE — Plan of Care (Signed)
  Problem: Activity: Goal: Risk for activity intolerance will decrease Outcome: Progressing   Problem: Safety: Goal: Ability to remain free from injury will improve Outcome: Progressing   Problem: Activity: Goal: Capacity to carry out activities will improve Outcome: Progressing   

## 2019-05-06 DIAGNOSIS — Z9114 Patient's other noncompliance with medication regimen: Secondary | ICD-10-CM

## 2019-05-06 DIAGNOSIS — I081 Rheumatic disorders of both mitral and tricuspid valves: Secondary | ICD-10-CM

## 2019-05-06 DIAGNOSIS — R011 Cardiac murmur, unspecified: Secondary | ICD-10-CM

## 2019-05-06 DIAGNOSIS — I2510 Atherosclerotic heart disease of native coronary artery without angina pectoris: Secondary | ICD-10-CM

## 2019-05-06 DIAGNOSIS — Z9861 Coronary angioplasty status: Secondary | ICD-10-CM

## 2019-05-06 LAB — BASIC METABOLIC PANEL
Anion gap: 10 (ref 5–15)
BUN: 24 mg/dL — ABNORMAL HIGH (ref 6–20)
CO2: 27 mmol/L (ref 22–32)
Calcium: 8.7 mg/dL — ABNORMAL LOW (ref 8.9–10.3)
Chloride: 101 mmol/L (ref 98–111)
Creatinine, Ser: 1.35 mg/dL — ABNORMAL HIGH (ref 0.61–1.24)
GFR calc Af Amer: >60 mL/min (ref >60)
GFR calc non Af Amer: 60 mL/min — ABNORMAL LOW (ref >60)
Glucose, Bld: 124 mg/dL — ABNORMAL HIGH (ref 70–99)
Potassium: 3.9 mmol/L (ref 3.5–5.1)
Sodium: 138 mmol/L (ref 135–145)

## 2019-05-06 MED ORDER — SPIRONOLACTONE 25 MG PO TABS: 12.5000 mg | ORAL_TABLET | Freq: Every day | ORAL | 0 refills | Status: DC

## 2019-05-06 MED ORDER — FUROSEMIDE 40 MG PO TABS: 40.0000 mg | ORAL_TABLET | Freq: Two times a day (BID) | ORAL | 0 refills | Status: DC

## 2019-05-06 NOTE — Progress Notes (Signed)
Progress Note  Patient Name: Tyler Wong Date of Encounter: 05/06/2019  Primary Cardiologist: Shelva Majestic, MD; Candee Furbish, MD  Subjective   Feels much better. Lying fully supine, walking w/o dyspnea. Weight 167 lb, looks like dry weight is around 165 lb.  Inpatient Medications    Scheduled Meds: . aspirin EC  81 mg Oral Daily  . atorvastatin  80 mg Oral Daily  . enoxaparin (LOVENOX) injection  40 mg Subcutaneous Q24H  . furosemide  40 mg Oral BID  . sacubitril-valsartan  1 tablet Oral BID  . spironolactone  12.5 mg Oral Daily   Continuous Infusions:  PRN Meds: acetaminophen **OR** acetaminophen, albuterol   Vital Signs    Vitals:   05/05/19 1945 05/06/19 0417 05/06/19 0421 05/06/19 0836  BP: 100/81 98/72  103/69  Pulse: 98 85  78  Resp: 20 20    Temp: 98.3 F (36.8 C) 98.4 F (36.9 C)    TempSrc: Oral Oral    SpO2: 98% 94%  98%  Weight:   76 kg   Height:        Intake/Output Summary (Last 24 hours) at 05/06/2019 0931 Last data filed at 05/06/2019 0900 Gross per 24 hour  Intake 1044 ml  Output 2525 ml  Net -1481 ml   Last 3 Weights 05/06/2019 05/05/2019 05/04/2019  Weight (lbs) 167 lb 8 oz 167 lb 14.4 oz 167 lb 12.8 oz  Weight (kg) 75.978 kg 76.159 kg 76.114 kg      Telemetry    NSR, occ PVCs - Personally Reviewed  ECG    NSR, LAA, left axis deviation, PRWP and old inferior MI. No acute ischemic changes - Personally Reviewed  Physical Exam  Comfortable GEN: No acute distress.   Neck: No JVD Cardiac: laterally displaced apical impulse, RRR, mild holosystolic murmur LLSB, no diastolic murmurs, rubs, or gallops.  Respiratory: Clear to auscultation bilaterally. GI: Soft, nontender, non-distended  MS: No edema; No deformity. Neuro:  Nonfocal  Psych: Normal affect   Labs    Chemistry Recent Labs  Lab 05/04/19 1006 05/05/19 0245 05/06/19 0239  NA 140 139 138  K 4.0 3.8 3.9  CL 105 100 101  CO2 23 27 27   GLUCOSE 123* 116* 124*  BUN 26* 24* 24*   CREATININE 1.22 1.30* 1.35*  CALCIUM 8.8* 8.6* 8.7*  PROT 6.5  --   --   ALBUMIN 3.1*  --   --   AST 16  --   --   ALT 19  --   --   ALKPHOS 83  --   --   BILITOT 0.9  --   --   GFRNONAA >60 >60 60*  GFRAA >60 >60 >60  ANIONGAP 12 12 10      Hematology Recent Labs  Lab 05/04/19 1006 05/05/19 0245  WBC 7.9 6.4  RBC 4.90 5.10  HGB 14.0 14.4  HCT 42.4 43.0  MCV 86.5 84.3  MCH 28.6 28.2  MCHC 33.0 33.5  RDW 15.8* 15.5  PLT 243 243    Cardiac Enzymes Recent Labs  Lab 05/04/19 1006 05/04/19 1437 05/04/19 2131  TROPONINI 0.07* 0.07* 0.06*   No results for input(s): TROPIPOC in the last 168 hours.   BNP Recent Labs  Lab 05/04/19 1006  BNP 1,206.6*     DDimer No results for input(s): DDIMER in the last 168 hours.   Radiology    Dg Chest Portable 1 View  Result Date: 05/04/2019 CLINICAL DATA:  Shortness of breath today EXAM:  PORTABLE CHEST 1 VIEW COMPARISON:  02/22/2019 FINDINGS: Cardiomegaly.  Stable mediastinal contours. Unchanged streaky opacity over the right upper lobe. No edema, effusion, or pneumothorax. IMPRESSION: Cardiomegaly and right upper lobe scarring. No acute finding when compared to priors. Electronically Signed   By: Monte Fantasia M.D.   On: 05/04/2019 10:16    Cardiac Studies   ECHO 05/05/2019  1. The left ventricle has moderately reduced systolic function, with an ejection fraction of 35-40%. The cavity size was moderately dilated. There is mildly increased left ventricular wall thickness. Left ventricular diastolic Doppler parameters are  consistent with pseudonormalization. Elevated left atrial and left ventricular end-diastolic pressures Left ventricular diffuse hypokinesis.  2. The right ventricle has normal systolic function. The cavity was normal. There is no increase in right ventricular wall thickness.  3. Left atrial size was moderately dilated.  4. Right atrial size was mildly dilated.  5. The pericardial effusion is posterior to the  left ventricle.  6. Trivial pericardial effusion is present.  7. The mitral valve is abnormal. Mild thickening of the mitral valve leaflet. Mitral valve regurgitation is moderate to severe by color flow Doppler.  8. The tricuspid valve is grossly normal. Tricuspid valve regurgitation is moderate.  9. The aortic valve is tricuspid. Mild sclerosis of the aortic valve.  Patient Profile     53 y.o. male with history of chronic systolic heart failure, CAD MI 2015 with DES to LAD , HTN, hyperlipidemia, and cocaine abuse, admitted w heart failure exacerbation after running out of diuretics.  Assessment & Plan    1: Acute on chronic systolic heart failure- EF improved on medical Rx including Entresto, carvedilol, Lasix and spironolactone.  He ran out of his diuretic a month ago and was admitted with volume overload and heart failure.  Appears close to dry weight of about 165 lb.His meds are delivered with about 1 week delay - would benefit from [place]. Reinforced the need for sodium restriction and daily weight monitoring.  2: Coronary artery disease- history of CAD status post remote LAD stent with RCA disease that was nondominant. Angina free, no evidence of acute coronary insufficiency.  3: Tobacco abuse- continues to smoke, counseled about the importance of smoking cessation  4: Cocaine abuse- counseled about the importance of stopping and high risk of death with cocaine.  [facility] will sign off.   Medication Recommendations:  Resume home meds Other recommendations (labs, testing, etc):  n/a Follow up as an outpatient:  CHF clinic 2-3 weeks  For questions or updates, please contact [facility] Please consult [email or web address] for contact info under        Signed, Sanda Klein, MD  05/06/2019, 9:31 AM

## 2019-05-06 NOTE — Discharge Summary (Signed)
Name: Tyler Wong MRN: [id number] DOB: 14-Dec-1966 53 y.o. PCP: Patient, No Pcp Per  Date of Admission: 05/04/2019  9:47 AM Date of Discharge: 05/06/2019 Attending Physician: Moise Boring Discharge Diagnosis: Acute on chronic systolic and diastolic heart failure  Discharge Medications: Allergies as of 05/06/2019   No Known Allergies     Medication List    TAKE these medications   albuterol 108 (90 Base) MCG/ACT inhaler Commonly known as:  VENTOLIN HFA Inhale 2 puffs into the lungs every 4 (four) hours as needed for wheezing or shortness of breath.   aspirin 81 MG EC tablet Take 1 tablet (81 mg total) by mouth daily.   atorvastatin 80 MG tablet Commonly known as:  LIPITOR Take 1 tablet (80 mg total) by mouth daily.   carvedilol 3.125 MG tablet Commonly known as:  COREG Take 1 tablet (3.125 mg total) by mouth 2 (two) times daily with a meal.   furosemide 40 MG tablet Commonly known as:  Lasix Take 1 tablet (40 mg total) by mouth 2 (two) times daily for 30 days.   multivitamin tablet Take 1 tablet by mouth daily.   Nitrostat 0.4 MG SL tablet Generic drug:  nitroGLYCERIN PLACE 1 TABLET UNDER TONGUE AS NEEDED FOR CHEST PAIN EVERY 5 MINUTES X 3 MAX DOSES.  CALL 911 IF PAIN PERSISTS What changed:  See the new instructions.   sacubitril-valsartan 24-26 MG Commonly known as:  ENTRESTO Take 1 tablet by mouth 2 (two) times daily.   spironolactone 25 MG tablet Commonly known as:  ALDACTONE Take 0.5 tablets (12.5 mg total) by mouth daily for 30 days. What changed:  Another medication with the same name was added. Make sure you understand how and when to take each.   spironolactone 25 MG tablet Commonly known as:  ALDACTONE Take 0.5 tablets (12.5 mg total) by mouth daily. What changed:  You were already taking a medication with the same name, and this prescription was added. Make sure you understand how and when to take each.       Disposition and follow-up:   Mr.Tyler  Wong was discharged from [facility] in Good condition.  At the hospital follow up visit please address:  1. Acute on chronic systolic and diastolic heart failure  -ensure pt compliant with medication regimen -continue to encourage cocaine cessation -repeat bmp and weight  2.  Labs / imaging needed at time of follow-up: bmp  3.  Pending labs/ test needing follow-up: none  Follow-up Appointments: Follow-up Information    [name], MD Follow up in 1 week(s).   Specialty:  Cardiology Contact information: [place] [address] [phone]        Argentina Donovan, PA-C Follow up in 1 week(s).   Specialty:  Family Medicine Why:  Please reestablish care with your primary care provider Contact information: [place] [address] [phone]           Hospital Course by problem list:  Acute on chronic systolic and diastolic heart failure  [name] is 53 yo man with a medical history of systolic heart failure (EF 20-25% in 2019), CAD with MI and DES to LAD in 2015, HTN, HLD, and cocaine use disorderwho presented with dyspnea and abdominal swelling consistent with heart failure exacerbation. He was hemodynamically stable and satting well on room air. In the setting of cocaine use and medication noncompliance (has been without spironolactone for a month).  Mildly elevated troponin,  likely demand ischemia. Diuresed with IV lasix and transitioned to PO. Weight stable at 167lbs. Diuresed to euvolemia. ECHO showed improved EF from 2019 study, increasing from 20-25% to 35-40% with persistent diffuse hypokinesis. MV and tricuspid valve regurg still present. Pt was discharged with prescriptions for medications he needed namely spironolactone and lasix.  He does have medassist and will order resupply as well so he was given a short supply.  He will have close follow up with cardiology and will reestablish with his PCP.     Discharge Vitals:   BP 92/71 (BP Location: Left Arm)   Pulse 86   Temp 97.9 F (36.6 C)   Resp 20   Ht 5' 10.5" (1.791 m)   Wt 76 kg Comment: C scale  SpO2 98%   BMI 23.69 kg/m   Pertinent Labs, Studies, and Procedures:  CBC Latest Ref Rng & Units 05/05/2019 05/04/2019 02/22/2019  WBC 4.0 - 10.5 K/uL 6.4 7.9 6.7  Hemoglobin 13.0 - 17.0 g/dL 14.4 14.0 13.9  Hematocrit 39.0 - 52.0 % 43.0 42.4 43.6  Platelets 150 - 400 K/uL 243 243 212   BMP Latest Ref Rng & Units 05/06/2019 05/05/2019 05/04/2019  Glucose 70 - 99 mg/dL 124(H) 116(H) 123(H)  BUN 6 - 20 mg/dL 24(H) 24(H) 26(H)  Creatinine 0.61 - 1.24 mg/dL 1.35(H) 1.30(H) 1.22  BUN/Creat Ratio 9 - 20 - - -  Sodium 135 - 145 mmol/L 138 139 140  Potassium 3.5 - 5.1 mmol/L 3.9 3.8 4.0  Chloride 98 - 111 mmol/L 101 100 105  CO2 22 - 32 mmol/L 27 27 23   Calcium 8.9 - 10.3 mg/dL 8.7(L) 8.6(L) 8.8(L)   IMPRESSIONS    1. The left ventricle has moderately reduced systolic function, with an ejection fraction of 35-40%. The cavity size was moderately dilated. There is mildly increased left ventricular wall thickness. Left ventricular diastolic Doppler parameters are  consistent with pseudonormalization. Elevated left atrial and left ventricular end-diastolic pressures Left ventricular diffuse hypokinesis.  2. The right ventricle has normal systolic function. The cavity was normal. There is no increase in right ventricular wall thickness.  3. Left atrial size was moderately dilated.  4. Right atrial size was mildly dilated.  5. The pericardial effusion is posterior to the left ventricle.  6. Trivial pericardial effusion is present.  7. The mitral valve is abnormal. Mild thickening of the mitral valve leaflet. Mitral valve regurgitation is moderate to severe by color flow Doppler.  8. The tricuspid valve is grossly normal. Tricuspid valve regurgitation is moderate.  9. The aortic valve is tricuspid. Mild sclerosis of the aortic valve.  SUMMARY    LVEF 35-40%, global hypokinesis, moderately dilated LV, grade 2 DD with elevated LV filling pressure, moderate to severe MR, moderate LAE, moderate TR, RVSP 45 mmHg, normal IVC, trivial pericardial effusion  Discharge Instructions: Discharge Instructions    Diet - low sodium heart healthy   Complete by:  As directed    Discharge instructions   Complete by:  As directed    [name] please make an effort to discontinue cocaine use as we discussed.  Please take your medications as prescribed and please follow these special instructions:  1. Follow a low-salt diet - you are allowed no more than 2,000mg  of sodium per day. Watch your fluid intake. In general, you should not be taking in more than 2 liters of fluid per day (no more than 8 glasses per day). This includes sources of water in foods like soup,  coffee, tea, milk, etc. 2. Weigh yourself on the same scale at same time of day and keep a log. 3. Call your doctor: (Anytime you feel any of the following symptoms)  - 3lb weight gain overnight or 5lb within a few days - Shortness of breath, with or without a dry hacking cough  - Swelling in the hands, feet or stomach  - If you have to sleep on extra pillows at night in order to breathe   Increase activity slowly   Complete by:  As directed       Signed: Katherine Roan, MD 05/06/2019, 6:40 PM

## 2019-05-06 NOTE — Progress Notes (Signed)
   Subjective: No overnight events. Patient walked around more yesterday without dyspnea.  He denies PND, orthopnea or chest pain.     Objective:  Vital signs in last 24 hours: Vitals:   05/05/19 1945 05/06/19 0417 05/06/19 0421 05/06/19 0836  BP: 100/81 98/72  103/69  Pulse: 98 85  78  Resp: 20 20    Temp: 98.3 F (36.8 C) 98.4 F (36.9 C)    TempSrc: Oral Oral    SpO2: 98% 94%  98%  Weight:   76 kg   Height:       Gen: alert and oriented, no distress CV: RRR, systolic murmur, no JVD Pulm: CTAB, no crackles, normal effort on room air Abd: bs+, soft, non-distended, non-tender Ext: no edema   ECHO - EF 35-40% - Diffuse LV hypokinesis - Trivial pericardial effusion - Moderate MV regurg with mild thickening of the MV leaflet - Moderate tricuspid regurg  Assessment/Plan:  Principal Problem:   Acute on chronic combined systolic (congestive) and diastolic (congestive) heart failure (HCC) Active Problems:   Tobacco abuse   Cocaine use   CAD S/P percutaneous coronary angioplasty   Chronic combined systolic and diastolic congestive heart failure (HCC)   HFrEF (heart failure with reduced ejection fraction) (HCC)   Acute on chronic HFrEF (heart failure with reduced ejection fraction) [facility])  Tyler Wong is 53 yo man with a medical history of systolic heart failure (EF 20-25% in 2019), CAD with MI and DES to LAD in 2015, HTN, HLD, and cocaine use disorder who presented with dyspnea and abdominal swelling consistent with heart failure exacerbation. He was hemodynamically stable and satting well on room air. In the setting of cocaine use and medication noncompliance (has been without spironolactone for a month).  Mildly elevated troponin, likely demand ischemia. Diuresed with IV lasix and transitioned to PO. Weight stable at 167lbs. Diuresed to euvolemia. ECHO showed improved EF from 2019 study, increasing from 20-25% to 35-40% with persistent diffuse hypokinesis. MV and tricuspid valve  regurg still present.   Acute on chronic systolic heart failure exacerbation HTN CAD: euvolemic to dry on exam, urine output decreasing uncertain if from being dry or transition to lasix.  Feeling better, eager to get home  - Cards following, appreciate recs - He is back on home lasix dose - Continue home aspirin, entresto, and spironolactone - Holding home coreg - Daily weights - Strict I/Os - Tele  HLD: continue home lipitor  Dispo: Anticipated discharge today pending cardiology review Katherine Roan, MD 05/06/2019, 9:11 AM

## 2019-05-06 NOTE — Progress Notes (Signed)
Talked with MD Hazle Coca since there are two orders for spironolactone on AVS. Per MD patient needs to take only spironolactone one timed daily. Patient informed and understands instructions.

## 2019-05-06 NOTE — Progress Notes (Signed)
Patient discharged: Home   Via: Wheelchair   Discharge paperwork given: to patient and family  Reviewed with teach back  IV and telemetry disconnected  Belongings given to patient    

## 2019-05-08 ENCOUNTER — Telehealth (HOSPITAL_COMMUNITY): Payer: Self-pay | Admitting: Licensed Clinical Social Worker

## 2019-05-08 NOTE — Telephone Encounter (Addendum)
Pt is new referral to [facility].  Pt currently without a phone and has no way to reliable communicate with clinic or assigned paramedic.  CSW discussed with CSW team member who will pick up pt a minute phone on Wednesday and community paramedic will take it out in person to the patient.  CSW able to speak with pt through his friends phone number and inform him of the plan.  Pt is agreeable and is thankful for clinic assistance.  Pt also reports he was able to pick up his lasix at [facility] today so he will be able to take it until his delivery from [facility] arrives.  CSW will continue to follow and assist as needed  [name], [place] Desk#: [phone] Cell#: [phone]

## 2019-05-10 ENCOUNTER — Telehealth (HOSPITAL_COMMUNITY): Payer: Self-pay | Admitting: Licensed Clinical Social Worker

## 2019-05-10 NOTE — Telephone Encounter (Signed)
CSW called pt friend, Chrys Racer, and informed her that we have gotten pt a minute phone and it is at the clinic front desk for him to pick up.  Pt friend thinks she will be seeing the patient today and will pass along the message.  CSW will continue to follow and assist as needed  [name], [place] [facility] Desk#: [phone] Cell#: [phone]

## 2019-05-12 ENCOUNTER — Telehealth (HOSPITAL_COMMUNITY): Payer: Self-pay | Admitting: Licensed Clinical Social Worker

## 2019-05-12 NOTE — Telephone Encounter (Signed)
CSW called pt friend and was able to confirm pt picked up the minute from from the clinic.  Pt was at the home and able to provide CSW with new cell phone number.  Number added to patient chart.  CSW will continue to follow and assist as needed  [name], [place] [facility] Desk#: [phone] Cell#: [phone]

## 2019-05-17 ENCOUNTER — Telehealth (HOSPITAL_COMMUNITY): Payer: Self-pay | Admitting: Licensed Clinical Social Worker

## 2019-05-17 ENCOUNTER — Other Ambulatory Visit (HOSPITAL_COMMUNITY): Payer: Self-pay

## 2019-05-17 ENCOUNTER — Telehealth: Payer: Self-pay

## 2019-05-17 NOTE — Telephone Encounter (Signed)
Message received from Marylouise Stacks, EMT requesting a hospital follow up for patient.  Informed her that an appointment has been scheduled for 05/26/2019 @ 1030.

## 2019-05-17 NOTE — Progress Notes (Signed)
Paramedicine Encounter    Patient ID: [name], male    DOB: [birth year], 53 y.o.   MRN: [id number]   Patient Care Team: Patient, No Pcp Per as PCP - General (General Practice) [name], MD as PCP - Cardiology (Cardiology) [name], MD as PCP - Advanced Heart Failure (Cardiology)  Patient Active Problem List   Diagnosis Date Noted  . Acute on chronic HFrEF (heart failure with reduced ejection fraction) ([place]) 05/05/2019  . HFrEF (heart failure with reduced ejection fraction) ([place]) 05/04/2019  . Acute combined systolic and diastolic congestive heart failure ([place]) 10/02/2018  . Hepatitis C 04/13/2017  . Current non-adherence to medical treatment 04/13/2017  . Acute on chronic combined systolic (congestive) and diastolic (congestive) heart failure ([place]) 04/13/2017  . HTN (hypertension) 04/12/2017  . Insomnia 01/18/2017  . Chronic combined systolic and diastolic congestive heart failure ([place]) 01/04/2017  . Hyperlipidemia 01/04/2017  . Chest pain 12/30/2016  . CAD S/P percutaneous coronary angioplasty 07/18/2014  . Tobacco abuse 07/17/2014  . Cocaine use 07/17/2014    Current Outpatient Medications:  .  albuterol (PROVENTIL HFA;VENTOLIN HFA) 108 (90 Base) MCG/ACT inhaler, Inhale 2 puffs into the lungs every 4 (four) hours as needed for wheezing or shortness of breath., Disp: 1 Inhaler, Rfl: 2 .  aspirin 81 MG EC tablet, Take 1 tablet (81 mg total) by mouth daily., Disp: 90 tablet, Rfl: 3 .  atorvastatin (LIPITOR) 80 MG tablet, Take 1 tablet (80 mg total) by mouth daily., Disp: 90 tablet, Rfl: 3 .  carvedilol (COREG) 3.125 MG tablet, Take 1 tablet (3.125 mg total) by mouth 2 (two) times daily with a meal., Disp: 180 tablet, Rfl: 3 .  furosemide (LASIX) 40 MG tablet, Take 1 tablet (40 mg total) by mouth 2 (two) times daily for 30 days., Disp: 60 tablet, Rfl: 0 .  Multiple Vitamin (MULTIVITAMIN) tablet, Take 1 tablet by mouth daily., Disp: , Rfl:  .  sacubitril-valsartan  (ENTRESTO) 24-26 MG, Take 1 tablet by mouth 2 (two) times daily., Disp: 180 tablet, Rfl: 3 .  spironolactone (ALDACTONE) 25 MG tablet, Take 0.5 tablets (12.5 mg total) by mouth daily., Disp: 7 tablet, Rfl: 0 .  NITROSTAT 0.4 MG SL tablet, PLACE 1 TABLET UNDER TONGUE AS NEEDED FOR CHEST PAIN EVERY 5 MINUTES X 3 MAX DOSES.  CALL 911 IF PAIN PERSISTS (Patient not taking: No sig reported), Disp: 25 tablet, Rfl: 3 .  spironolactone (ALDACTONE) 25 MG tablet, Take 0.5 tablets (12.5 mg total) by mouth daily for 30 days. (Patient not taking: Reported on 05/04/2019), Disp: 45 tablet, Rfl: 3 No Known Allergies    Social History   Socioeconomic History  . Marital status: Single    Spouse name: Not on file  . Number of children: 2  . Years of education: 39  . Highest education level: 12th grade  Occupational History  . Occupation: Landscaping  Social Needs  . Financial resource strain: Very hard  . Food insecurity:    Worry: Often true    Inability: Often true  . Transportation needs:    Medical: Yes    Non-medical: Yes  Tobacco Use  . Smoking status: Current Every Day Smoker    Packs/day: 0.50    Years: 30.00    Pack years: 15.00    Types: Cigarettes  . Smokeless tobacco: Never Used  . Tobacco comment: .5 PK PER DAY  Substance and Sexual Activity  . Alcohol use: No    Comment: Patient denies abuse, states  social drinker  . Drug use: Yes    Types: Cocaine    Comment: heroin  . Sexual activity: Yes  Lifestyle  . Physical activity:    Days per week: Not on file    Minutes per session: Not on file  . Stress: Not on file  Relationships  . Social connections:    Talks on phone: Not on file    Gets together: Not on file    Attends religious service: Not on file    Active member of club or organization: Not on file    Attends meetings of clubs or organizations: Not on file    Relationship status: Not on file  . Intimate partner violence:    Fear of current or ex partner: Not on file     Emotionally abused: Not on file    Physically abused: Not on file    Forced sexual activity: Not on file  Other Topics Concern  . Not on file  Social History Narrative   Lives in [place] with his sister and girlfriend. He works Aeronautical engineer work.     Physical Exam      Future Appointments  Date Time Provider [place]  05/19/2019  9:00 AM [facility] PA/NP [facility] None    BP 110/74   Pulse 80   Temp (!) 97.5 F (36.4 C)   Resp 15   SpO2 98%  B/p standing 124/systolic  Weight [id number]  First visit back with pt, he had lost his phone and the clinic was able to provide one for him and we were able to make a home visit.  He reports no drug use since he was d/c . He said he was on a good path right now and does not need counseling at this time but will keep it in mind.  He states prior to his last admission he felt his breathing was worsening. He also noticed more swelling to his abd and slight edema to lower extremities.   He has albuterol inhaler however the dog chewed on the end of it but he thinks he has another one.   He started process of medicaid/disabilty but lost track and states he needs to start it over.   Pt reports he has been feeling ok. Taking all his meds per pt. He does use a pill box but he hasnt filled it up recently. He states getting food was an issue due to lack of transportation-will see if he can be placed on food program.    Pt denies h/a, no dizziness, no c/p, no sob.  Will need spiro sent in to outpt pharm through HF fund and lasix needs to be sent to [facility] for future refills.   Marylouise Stacks, [facility] Paramedic  05/17/19

## 2019-05-17 NOTE — Telephone Encounter (Signed)
CSW received call from community paramedic requesting help getting refills for pt medications.  CSW sent in request to clinic for refill on pt spirolactone sent to [facility] under the heart failure fund and refill for pt lasix sent to [facility] will continue to follow and assist as needed  [name], [place] [facility] Desk#: [phone] Cell#: [phone]

## 2019-05-18 ENCOUNTER — Other Ambulatory Visit (HOSPITAL_COMMUNITY): Payer: Self-pay

## 2019-05-18 ENCOUNTER — Telehealth (HOSPITAL_COMMUNITY): Payer: Self-pay | Admitting: Licensed Clinical Social Worker

## 2019-05-18 DIAGNOSIS — I5042 Chronic combined systolic (congestive) and diastolic (congestive) heart failure: Secondary | ICD-10-CM

## 2019-05-18 MED ORDER — FUROSEMIDE 40 MG PO TABS: 40.0000 mg | ORAL_TABLET | Freq: Two times a day (BID) | ORAL | 0 refills | Status: DC

## 2019-05-18 MED ORDER — SPIRONOLACTONE 25 MG PO TABS: 12.5000 mg | ORAL_TABLET | Freq: Every day | ORAL | 3 refills | Status: DC

## 2019-05-18 MED FILL — SPIRONOLACTONE 25 MG TABS: 25 | 34 days supply | Qty: 17 | Fill #0

## 2019-05-18 NOTE — Progress Notes (Signed)
Heart Failure TeleHealth Note  Due to national recommendations of social distancing due to [place] 19, telehealth visit is felt to be most appropriate for this patient at this time.  I discussed the limitations, risks, security and privacy concerns of performing an evaluation and management service by telephone and the availability of in person appointments. I also discussed with the patient that there may be a patient responsible charge related to this service. The patient expressed understanding and agreed to proceed.   ID:  Tyler Wong, DOB Nov 12, 1966, MRN [id number]  Location: Home  Provider location: [address] [place] Type of Visit: Established patient   PCP:  Patient, No Pcp Per  Cardiologist:  Shelva Majestic, MD Primary HF: Dr Aundra Dubin  Chief Complaint: Hospital f/u   History of Present Illness: Tyler Wong is a 53 y.o. male  with history of chronic systolic heart failure, CAD MI 2015 with DES to LAD , HTN, hyperlipidemia, and cocaine abuse.He has been using cocaine for 30 years. Typically uses cocaine once a month. Drinks 1 beer a week. Smokes 1 PPD every 3 days. Runs out medications every month because he cant get refills..  Admitted 10/02/18 with increased shortness of breathand chest pain. Underwent RHC/LHC with  significant CAD but no target. Possible that low EF is due to cocaine abuse. HF medications started. He was given 30 day supply of HF medications prior to discharge. He was referred to paramedicine.   Admitted 04/2019 with volume overload in setting of cocaine use and medication noncompliance (out of spiro). Echo showed improved EF 35-40%. Diuresed and received refills of medications at [facility].  Patient presents via audio conferencing for a telehealth visit today. Overall doing fine now. No more SOB or CP. No edema, orthopnea, or PND. UOP better with lasix now. He thought that he had built up a tolerance. Appetite fine. No dizziness. Getting meds through med  assist and HF fund. Followed by HF paramedicine. Does not have transportation for food, but has been enrolled in meal delivery program. Weight at baseline 167 lbs. BP 110s on paramedic checks. He is not working right now. Denies recent cocaine use. Smoking 1 pack in 7 days.   Pt denies symptoms of cough, fevers, chills, or new SOB worrisome for COVID 19.   Echo 04/2019: EF 35-40%, RV normal, mod to severe MR, mod TR   Echo10/05/2018  RV moderately dilated. RA severly dilated. . LV EF20-25%severely reduced with ventricular dilation. Wall motion appears diffusely worse at apex, involving dyskinesis of inferior apex and severe hypokinesis/akinesis of the anterior, anterolateral, inferolateral, and inferoseptal apex. 2. At least moderate MR due to functional noncoaptation of the leaflets. 3. Moderate-severe TR with elevated   R/LHC 10/03/18: RHC Procedural Findings: Hemodynamics (mmHg) RA mean 7 RV 55/9 PA 57/27, mean 39 PCWP mean 20 LV 95/26 AO 95/69 Oxygen saturations: PA 65% AO 93% Cardiac Output (Fick) 5.09  Cardiac Index (Fick) 2.59 PVR 3.7 WU  Left Main  Short, no significant disease.  Left Anterior Descending  Patent proximal LAD stent. 30% proximal LAD stenosis just proximal to stent.  [place]amus Intermedius  Luminal irregularities with moderate diffuse disease in the distal branches.  Left Circumflex  Dominant vessel providing left PDA. Luminal irregularities in the LCx itself. Moderate OM1 with 90% ostial stenosis.  Right Coronary Artery  Nondominant RCA. It does provide a moderate PLV branch with 70% mid-vessel stenosis.   1. Elevated left heart filling pressure (PCWP and LVEDP).  2. Preserved  cardiac output.  3. Primarily pulmonary venous hypertension.  4. Nondominant RCA providing a PLV with 70% stenosis. 90% ostial moderate OM1. Will plan medical management of coronary disease. No chest pain and does not explain fall in EF.  Past Medical  History:  Diagnosis Date   CAD in native artery 07/17/14   STEMI- LAD stenosis with DES   CHF (congestive heart failure) (HCC)    Cocaine abuse (HCC)    COPD (chronic obstructive pulmonary disease) (HCC)    Dyspnea    Hypertension    ST elevation myocardial infarction (STEMI) involving left anterior descending (LAD) coronary artery with complication ([place])    Tobacco abuse 07/17/2014   Past Surgical History:  Procedure Laterality Date   CORONARY ANGIOPLASTY WITH STENT PLACEMENT  07/18/14   resolute DES to LAD STEMI   LEFT HEART CATH N/A 07/17/2014   Procedure: LEFT HEART CATH;  Surgeon: [name], MD;  Location: [facility] CATH LAB;  Service: Cardiovascular;  Laterality: N/A;   PERCUTANEOUS CORONARY STENT INTERVENTION (PCI-S)  07/17/2014   Procedure: PERCUTANEOUS CORONARY STENT INTERVENTION (PCI-S);  Surgeon: [name], MD;  Location: [facility] CATH LAB;  Service: Cardiovascular;;   RIGHT/LEFT HEART CATH AND CORONARY ANGIOGRAPHY N/A 10/03/2018   Procedure: RIGHT/LEFT HEART CATH AND CORONARY ANGIOGRAPHY;  Surgeon: [name], MD;  Location: [facility] CV LAB;  Service: Cardiovascular;  Laterality: N/A;     Current Outpatient Medications  Medication Sig Dispense Refill   albuterol (PROVENTIL HFA;VENTOLIN HFA) 108 (90 Base) MCG/ACT inhaler Inhale 2 puffs into the lungs every 4 (four) hours as needed for wheezing or shortness of breath. 1 Inhaler 2   aspirin 81 MG EC tablet Take 1 tablet (81 mg total) by mouth daily. 90 tablet 3   atorvastatin (LIPITOR) 80 MG tablet Take 1 tablet (80 mg total) by mouth daily. 90 tablet 3   carvedilol (COREG) 3.125 MG tablet Take 1 tablet (3.125 mg total) by mouth 2 (two) times daily with a meal. 180 tablet 3   furosemide (LASIX) 40 MG tablet Take 1 tablet (40 mg total) by mouth 2 (two) times daily for 30 days. 60 tablet 0   Multiple Vitamin (MULTIVITAMIN) tablet Take 1 tablet by mouth daily.     NITROSTAT 0.4 MG SL tablet PLACE 1 TABLET  UNDER TONGUE AS NEEDED FOR CHEST PAIN EVERY 5 MINUTES X 3 MAX DOSES.  CALL 911 IF PAIN PERSISTS (Patient not taking: No sig reported) 25 tablet 3   sacubitril-valsartan (ENTRESTO) 24-26 MG Take 1 tablet by mouth 2 (two) times daily. 180 tablet 3   spironolactone (ALDACTONE) 25 MG tablet Take 0.5 tablets (12.5 mg total) by mouth daily. 7 tablet 0   spironolactone (ALDACTONE) 25 MG tablet Take 0.5 tablets (12.5 mg total) by mouth daily for 30 days. 45 tablet 3   No current facility-administered medications for this encounter.     Allergies:   Patient has no known allergies.   Social History:  The patient  reports that he has been smoking cigarettes. He has a 15.00 pack-year smoking history. He has never used smokeless tobacco. He reports current drug use. Drug: Cocaine. He reports that he does not drink alcohol.   Family History:  The patient's family history includes Cirrhosis in his father; Heart attack in his brother, mother, and sister; Heart failure in his sister.   ROS:  Please see the history of present illness.   All other systems are personally reviewed and negative.    Exam:  (  Video/Tele Health Call; Exam is subjective and or/visual.) General:  Speaks in full sentences. No resp difficulty. Lungs: Normal respiratory effort with conversation.  Abdomen: No distension per patient report Extremities: Pt denies edema. Neuro: Alert & oriented x 3.   Recent Labs: 10/02/2018: Magnesium 2.0 05/04/2019: ALT 19; B Natriuretic Peptide 1,206.6 05/05/2019: Hemoglobin 14.4; Platelets 243 05/06/2019: BUN 24; Creatinine, Ser 1.35; Potassium 3.9; Sodium 138  Personally reviewed   Wt Readings from Last 3 Encounters:  05/06/19 76 kg (167 lb 8 oz)  02/28/19 79.9 kg (176 lb 3.2 oz)  02/22/19 79.8 kg (176 lb)      ASSESSMENT AND PLAN:  1. Chronic systolic HF. NICM - Echo 2015 EF 40-45% - Echo 09/2018: EF 20-25%, grade 3 DD, mod MR, LA and RA severely dilated, RV mod dilated, mod to severe TR,  trivial pericardial effusion - LHC 09/2018: showed some CAD, but did not explain drop in EF - Echo 04/2019: EF 35-40%, RV normal, mod to severe MR, mod TR  - NYHA II. Volume sounds stable. - Continue lasix 40 mg BID - Continue spiro 12.5 mg daily. Getting filled at [facility] now because [facility] doesn't have it. - Continue coreg 3.125 mg BID - Continue Entresto 24/26 mg BID - Followed by HF paramedicine. Gets meds through med assist and HF fund.   2. CAD -Had LHC 2015 with DES to LAD - As above, LHC 09/2018 showed CAD, but did not explain decreased in EF - Continuelipitor and 81 mg aspirin -No s/s of ischemia.  3. History of cocaine/tobacco abuse - Smoking 1 pack in 1 week. Encouraged complete cessation. - Denies cocaine use recently.   4. Snores - With daytime sleepiness. He has never had a sleep study.  - He is medicaid pending. He says he did not complete the paperwork. Once approved, will plan to set up for sleep study.   5. No PCP - He has been calling [facility], but has been unable to get an appointment.   COVID screen The patient does not have any symptoms that suggest any further testing/ screening at this time.  Social distancing reinforced today.  Patient Risk: After full review of this patients clinical status, I feel that they are at moderate risk for cardiac decompensation at this time.  Orders/Follow up: Refer to HF CSW for assistance with applying for medicaid and help with getting appointment CHW. BMET in HF clinic next week. He will take the bus. Follow up 6 weeks in APP clinic.   Today, I have spent 13 minutes with the patient with telehealth technology discussing the above issues.    Signed, Georgiana Shore, NP  05/19/2019 9:01 AM   [facility] [address] [facility] [place] [address] [phone] (office) [phone] (fax)

## 2019-05-18 NOTE — Telephone Encounter (Signed)
CSW informed by community paramedic that pt with food insecurity concerns.  CSW spoke with patient regarding current concerns and he endorses that it has been hard to get enough food due to financial and transportation barriers.  CSW discussed CV food delivery program with patient and he is agreeable to referral.  Pt expresses understanding that food would be delivered to front door with no face to face contact.  CSW also called [facility] and had them fill and mail out patients spirolactone.  CSW will continue to follow and assist as needed  [name], [facility] Desk#: [phone] Cell#: [phone]

## 2019-05-19 ENCOUNTER — Telehealth (HOSPITAL_COMMUNITY): Payer: Self-pay

## 2019-05-19 ENCOUNTER — Other Ambulatory Visit: Payer: Self-pay

## 2019-05-19 ENCOUNTER — Ambulatory Visit (HOSPITAL_COMMUNITY)
Admission: RE | Admit: 2019-05-19 | Discharge: 2019-05-19 | Disposition: A | Discharge status: Home / Self Care | Payer: Medicaid Other | Source: Ambulatory Visit | Attending: Cardiology | Admitting: Cardiology

## 2019-05-19 DIAGNOSIS — Z9861 Coronary angioplasty status: Secondary | ICD-10-CM

## 2019-05-19 DIAGNOSIS — F17200 Nicotine dependence, unspecified, uncomplicated: Secondary | ICD-10-CM

## 2019-05-19 DIAGNOSIS — I2510 Atherosclerotic heart disease of native coronary artery without angina pectoris: Secondary | ICD-10-CM

## 2019-05-19 DIAGNOSIS — I5022 Chronic systolic (congestive) heart failure: Secondary | ICD-10-CM

## 2019-05-19 DIAGNOSIS — R0683 Snoring: Secondary | ICD-10-CM

## 2019-05-19 NOTE — Telephone Encounter (Signed)
Reviewed AVS with pt  Lab appt 6/1 @9   6 wk f/u APP 7/6 @930 

## 2019-05-19 NOTE — Addendum Note (Signed)
Encounter addended by: Jovita Kussmaul, RN on: 05/19/2019 9:26 AM  Actions taken: Order list changed, Diagnosis association updated

## 2019-05-24 ENCOUNTER — Telehealth (HOSPITAL_COMMUNITY): Payer: Self-pay | Admitting: Licensed Clinical Social Worker

## 2019-05-24 NOTE — Telephone Encounter (Signed)
CSW received call from pt to confirm upcoming appts.  CSW informed pt of CHW appt on Friday as well as lab appt with clinic next Monday- pt has written these appts down and does not forsee any barriers to attending them.  CSW will continue to follow and assist as needed  [name], [facility] Desk#: [phone] Cell#: [phone]

## 2019-05-26 ENCOUNTER — Ambulatory Visit: Payer: Self-pay | Attending: Primary Care | Admitting: Primary Care

## 2019-05-26 ENCOUNTER — Encounter: Payer: Self-pay | Admitting: Primary Care

## 2019-05-26 ENCOUNTER — Telehealth (HOSPITAL_COMMUNITY): Payer: Self-pay | Admitting: Licensed Clinical Social Worker

## 2019-05-26 ENCOUNTER — Other Ambulatory Visit: Payer: Self-pay

## 2019-05-26 DIAGNOSIS — F1910 Other psychoactive substance abuse, uncomplicated: Secondary | ICD-10-CM

## 2019-05-26 DIAGNOSIS — I5041 Acute combined systolic (congestive) and diastolic (congestive) heart failure: Secondary | ICD-10-CM

## 2019-05-26 DIAGNOSIS — I10 Essential (primary) hypertension: Secondary | ICD-10-CM

## 2019-05-26 DIAGNOSIS — R0602 Shortness of breath: Secondary | ICD-10-CM

## 2019-05-26 NOTE — Progress Notes (Signed)
Virtual Visit via Telephone Note  I connected with Tyler Wong on 05/26/19 at 10:30 AM EDT by telephone and verified that I am speaking with the correct person using two identifiers.   I discussed the limitations, risks, security and privacy concerns of performing an evaluation and management service by telephone and the availability of in person appointments. I also discussed with the patient that there may be a patient responsible charge related to this service. The patient expressed understanding and agreed to proceed.   History of Present Illness: Tyler Wong is being seen via tele to establish care.  Previous hospitalization May 7 through May 06, 2019 for acute on chronic combined systolic and diastolic heart failure.  Past medical history of CAD, dyspnea on exertion, cocaine abuse   Observations/Objective: Review of Systems  Constitutional: Negative.   HENT: Negative.   Eyes: Negative.   Respiratory: Positive for shortness of breath.   Cardiovascular: Negative.   Gastrointestinal: Negative.   Genitourinary: Positive for frequency.  Musculoskeletal: Negative.   Skin: Negative.   Neurological: Positive for weakness.  Endo/Heme/Allergies: Negative.     Assessment and Plan: [name] was seen today for congestive heart failure.  Diagnoses and all orders for this visit:  Short of breath on exertion This was exacerbated by congestive heart failure.  Diuresed in the hospital improving  Acute combined systolic and diastolic congestive heart failure (HCC) Followed by Dr. Loralie Champagne cardiology for acute on chronic systolic and diastolic heart failure at that appointment he will need a repeat BMP and weight.  Ask patient was seen instructed to weigh daily stated yes and when to inform cardiologist.  Essential hypertension Discharge from the hospital on Entresto take 1 tablet twice daily patient is uninsured and unlikely to be able to afford medication continuously.  Advised patient to  discuss affordability with medications  Drug abuse ([place]) Patient has a substance abuse with cocaine drug of choice explained this drug can increase blood pressure and heart rate and distress.heart electrical signals also stimulates to nervous system to include having a heart attack.  Patient voiced understanding.    Follow Up Instructions:    I discussed the assessment and treatment plan with the patient. The patient was provided an opportunity to ask questions and all were answered. The patient agreed with the plan and demonstrated an understanding of the instructions.   The patient was advised to call back or seek an in-person evaluation if the symptoms worsen or if the condition fails to improve as anticipated.  I provided 20 minutes of non-face-to-face time during this encounter.  This includes reviewing encounters, labs and images   [name], NP

## 2019-05-26 NOTE — Progress Notes (Signed)
Per pt this visit is about his CHF. Per pt right now everything has been feeling well, but just run out of breath easily and it's been going on for a little while but got worse lately. Per pt it's worse when going up a stairs. Per pt he fall asleep too much

## 2019-05-26 NOTE — Telephone Encounter (Signed)
CSW contacted patient to follow up on weekly food delivery package. Patient informed of no face to face contact during delivery. CSW discussed transition option for food delivery as the Covid 19 Food relief program will be ending on June 09, 2019. Patient verbalizes understanding and grateful for the assistance.  CSW continues to follow for supportive needs.  Loleta Frommelt H. Taylia Berber, LCSW Clinical Social Worker [facility] Desk#: [phone] Cell#: [phone]   

## 2019-05-28 ENCOUNTER — Encounter: Payer: Self-pay | Admitting: Primary Care

## 2019-05-29 ENCOUNTER — Other Ambulatory Visit (HOSPITAL_COMMUNITY): Payer: Self-pay

## 2019-05-29 NOTE — Progress Notes (Signed)
Pt contacted me and advised he has an emergency and is unable to keep his appointment with me today. Will resch for next week.   Marylouise Stacks, EMT-Paramedic  05/29/19

## 2019-06-01 ENCOUNTER — Telehealth (HOSPITAL_COMMUNITY): Payer: Self-pay | Admitting: Licensed Clinical Social Worker

## 2019-06-01 NOTE — Telephone Encounter (Signed)
CSW contacted patient to follow up on weekly food delivery package. Patient informed of delivery time and no face to face contact during delivery. CSW shared transition option for food delivery as the Covid 19 Food relief program will be ending on June 09, 2019. Message left as no answer.  CSW continues to follow for supportive needs.   Marris Frontera H. Laruen Risser, LCSW Clinical Social Worker [facility] Desk#: [phone] Cell#: [phone]   

## 2019-06-05 ENCOUNTER — Telehealth (HOSPITAL_COMMUNITY): Payer: Self-pay

## 2019-06-05 NOTE — Telephone Encounter (Signed)
I contacted pt regarding home visit this week--he was not able to meet last week due to an emergency. He told me he would contact me in the morning as he is in and out this week.   Marylouise Stacks, EMT-Paramedic  06/05/19

## 2019-06-08 ENCOUNTER — Telehealth (HOSPITAL_COMMUNITY): Payer: Self-pay

## 2019-06-08 NOTE — Telephone Encounter (Signed)
Pt texted me and said he is still away and will text me in the morning.   By Thursday--he has yet to reach out to me.   Marylouise Stacks, EMT-Paramedic  06/08/19

## 2019-06-12 ENCOUNTER — Telehealth (HOSPITAL_COMMUNITY): Payer: Self-pay

## 2019-06-12 NOTE — Telephone Encounter (Signed)
Unable to reach pt--his phone is not accepting calls nor texts.   Marylouise Stacks, EMT-Paramedic  06/12/19

## 2019-06-19 ENCOUNTER — Telehealth (HOSPITAL_COMMUNITY): Payer: Self-pay

## 2019-06-19 NOTE — Telephone Encounter (Signed)
Pt texted me last night he reports he had new phone and new number--I asked him if he was available for a visit this week and he advised he would check and get back to me.  His number is updated in chart.   Marylouise Stacks, EMT-Paramedic  06/19/19

## 2019-06-26 ENCOUNTER — Telehealth (HOSPITAL_COMMUNITY): Payer: Self-pay

## 2019-06-28 ENCOUNTER — Telehealth (HOSPITAL_COMMUNITY): Payer: Self-pay

## 2019-06-28 NOTE — Telephone Encounter (Signed)
Attempted to reach pt regarding appointment this week, he did not respond back.  He has clinic appoint next week.   Marylouise Stacks, EMT-Paramedic  06/28/19

## 2019-06-28 NOTE — Telephone Encounter (Signed)
Pt contacted me back, pt states he has been staying with his sister in [place] and will be back to his place soon.  I also reminded pt of his upcoming appointment on Monday, he states he will not be available for that appointment. I advised him to call the clinic to reschedule that appointment.  I did call to cancel that appoint for him, but he needs to call to get appoint that is best for him.   Marylouise Stacks, EMT-Paramedic  06/28/19

## 2019-07-03 ENCOUNTER — Encounter (HOSPITAL_COMMUNITY): Payer: Self-pay

## 2019-07-03 ENCOUNTER — Telehealth (HOSPITAL_COMMUNITY): Payer: Self-pay

## 2019-07-03 NOTE — Telephone Encounter (Signed)
Contacted pt regarding home visit this week--no answer.   Marylouise Stacks, EMT-Paramedic  07/03/19

## 2019-07-10 ENCOUNTER — Telehealth (HOSPITAL_COMMUNITY): Payer: Self-pay

## 2019-07-10 NOTE — Telephone Encounter (Signed)
Pt is being d/c from paramedicine program due to lack of contact with pt after numerous attempts.   Marylouise Stacks, EMT-Paramedic  07/10/19

## 2019-07-17 MED FILL — FUROSEMIDE 40 MG TAB: 40 | 34 days supply | Qty: 100 | Fill #0

## 2019-09-11 MED FILL — FUROSEMIDE 40 MG TAB: 40 | 34 days supply | Qty: 100 | Fill #1

## 2019-10-19 ENCOUNTER — Other Ambulatory Visit: Payer: Self-pay | Admitting: Internal Medicine

## 2019-10-19 DIAGNOSIS — K029 Dental caries, unspecified: Secondary | ICD-10-CM | POA: Diagnosis not present

## 2019-10-19 MED FILL — HYDROCODON-APAP 10-325: 10-325 | 5 days supply | Qty: 20 | Fill #0

## 2019-10-19 MED FILL — AMOX-CLAV 875-125 MG TABLET: 875-125 | 10 days supply | Qty: 20 | Fill #0

## 2019-10-19 MED FILL — FUROSEMIDE 40 MG TAB: 40 | 30 days supply | Qty: 60 | Fill #0

## 2019-11-13 MED FILL — FUROSEMIDE 40 MG TAB: 40 | 30 days supply | Qty: 60 | Fill #1

## 2019-12-01 MED FILL — FUROSEMIDE 40 MG TAB: 40 | 30 days supply | Qty: 60 | Fill #2

## 2019-12-21 ENCOUNTER — Other Ambulatory Visit (HOSPITAL_COMMUNITY): Payer: Self-pay

## 2019-12-21 MED ORDER — FUROSEMIDE 40 MG PO TABS: 40.0000 mg | ORAL_TABLET | Freq: Two times a day (BID) | ORAL | 3 refills | Status: DC

## 2019-12-21 MED FILL — FUROSEMIDE 40 MG TAB: 40 | 30 days supply | Qty: 60 | Fill #3

## 2020-01-16 ENCOUNTER — Other Ambulatory Visit: Payer: Self-pay | Admitting: Cardiology

## 2020-01-16 MED FILL — FUROSEMIDE 40 MG TAB: 40 | 30 days supply | Qty: 60 | Fill #0

## 2020-01-24 ENCOUNTER — Other Ambulatory Visit: Payer: Self-pay

## 2020-01-24 ENCOUNTER — Encounter (INDEPENDENT_AMBULATORY_CARE_PROVIDER_SITE_OTHER): Payer: Self-pay | Admitting: Primary Care

## 2020-01-24 ENCOUNTER — Ambulatory Visit (INDEPENDENT_AMBULATORY_CARE_PROVIDER_SITE_OTHER): Payer: Medicaid Other | Admitting: Primary Care

## 2020-01-24 VITALS — BP 117/78 | HR 93 | Temp 97.3°F | Ht 70.5 in | Wt 185.0 lb

## 2020-01-24 DIAGNOSIS — Z23 Encounter for immunization: Secondary | ICD-10-CM | POA: Diagnosis not present

## 2020-01-24 DIAGNOSIS — R7303 Prediabetes: Secondary | ICD-10-CM | POA: Diagnosis not present

## 2020-01-24 DIAGNOSIS — R0602 Shortness of breath: Secondary | ICD-10-CM

## 2020-01-24 DIAGNOSIS — Z720 Tobacco use: Secondary | ICD-10-CM | POA: Diagnosis not present

## 2020-01-24 DIAGNOSIS — R1084 Generalized abdominal pain: Secondary | ICD-10-CM

## 2020-01-24 DIAGNOSIS — I509 Heart failure, unspecified: Secondary | ICD-10-CM

## 2020-01-24 LAB — POCT GLYCOSYLATED HEMOGLOBIN (HGB A1C): Hemoglobin A1C: 6.5 % — AB (ref 4.0–5.6)

## 2020-01-24 MED ORDER — OMEPRAZOLE 20 MG PO CPDR: 20.0000 mg | DELAYED_RELEASE_CAPSULE | Freq: Every day | ORAL | 3 refills | Status: DC

## 2020-01-24 MED ORDER — ALBUTEROL SULFATE HFA 108 (90 BASE) MCG/ACT IN AERS: 2.0000 | INHALATION_SPRAY | RESPIRATORY_TRACT | 1 refills | Status: DC | PRN

## 2020-01-24 MED FILL — OMEPRAZOLE DR 20 MG CAPSULE: 20 | 30 days supply | Qty: 30 | Fill #0

## 2020-01-24 MED FILL — VENTOLIN HFA 90 MCG INHALER: 108 (90 BAS | 17 days supply | Qty: 18 | Fill #0

## 2020-01-24 NOTE — Progress Notes (Signed)
Acute Office Visit  Subjective:    Patient ID: Tyler Wong, male    DOB: 11-26-1966, 54 y.o.   MRN: [id number]  Chief Complaint  Patient presents with  . Abdominal Pain  . Shortness of Breath    HPI Patient is in today for an acute visit with complaints of shortness of breath and abdominal pain this has been present for several months. Unable to identify what makes it worst or better.  Past Medical History:  Diagnosis Date  . CAD in native artery 07/17/14   STEMI- LAD stenosis with DES  . CHF (congestive heart failure) ([place])   . Cocaine abuse ([place])   . COPD (chronic obstructive pulmonary disease) ([place])   . Dyspnea   . Hypertension   . ST elevation myocardial infarction (STEMI) involving left anterior descending (LAD) coronary artery with complication ([place])   . Tobacco abuse 07/17/2014    Past Surgical History:  Procedure Laterality Date  . CORONARY ANGIOPLASTY WITH STENT PLACEMENT  07/18/14   resolute DES to LAD STEMI  . LEFT HEART CATH N/A 07/17/2014   Procedure: LEFT HEART CATH;  Surgeon: [name], MD;  Location: [facility] CATH LAB;  Service: Cardiovascular;  Laterality: N/A;  . PERCUTANEOUS CORONARY STENT INTERVENTION (PCI-S)  07/17/2014   Procedure: PERCUTANEOUS CORONARY STENT INTERVENTION (PCI-S);  Surgeon: [name], MD;  Location: [facility] CATH LAB;  Service: Cardiovascular;;  . RIGHT/LEFT HEART CATH AND CORONARY ANGIOGRAPHY N/A 10/03/2018   Procedure: RIGHT/LEFT HEART CATH AND CORONARY ANGIOGRAPHY;  Surgeon: [name], MD;  Location: [place] CV LAB;  Service: Cardiovascular;  Laterality: N/A;    Family History  Problem Relation Age of Onset  . Cirrhosis Father   . Heart attack Mother   . Heart attack Sister   . Heart failure Sister   . Heart attack Brother     Social History   Socioeconomic History  . Marital status: Single    Spouse name: Not on file  . Number of children: 2  . Years of education: 38  . Highest education level: 12th grade   Occupational History  . Occupation: Landscaping  Tobacco Use  . Smoking status: Current Every Day Smoker    Packs/day: 0.50    Years: 30.00    Pack years: 15.00    Types: Cigarettes  . Smokeless tobacco: Never Used  . Tobacco comment: .5 PK PER DAY  Substance and Sexual Activity  . Alcohol use: No    Comment: Patient denies abuse, states social drinker  . Drug use: Not Currently    Types: Cocaine    Comment: heroin  . Sexual activity: Yes  Other Topics Concern  . Not on file  Social History Narrative   Lives in [place] with his sister and girlfriend. He works Aeronautical engineer work.    Social Determinants of Health   Financial Resource Strain:   . Difficulty of Paying Living Expenses: Not on file  Food Insecurity:   . Worried About Charity fundraiser in the Last Year: Not on file  . Ran Out of Food in the Last Year: Not on file  Transportation Needs:   . Lack of Transportation (Medical): Not on file  . Lack of Transportation (Non-Medical): Not on file  Physical Activity:   . Days of Exercise per Week: Not on file  . Minutes of Exercise per Session: Not on file  Stress:   . Feeling of Stress : Not on file  Social Connections:   .  Frequency of Communication with Friends and Family: Not on file  . Frequency of Social Gatherings with Friends and Family: Not on file  . Attends Religious Services: Not on file  . Active Member of Clubs or Organizations: Not on file  . Attends Archivist Meetings: Not on file  . Marital Status: Not on file  Intimate Partner Violence:   . Fear of Current or Ex-Partner: Not on file  . Emotionally Abused: Not on file  . Physically Abused: Not on file  . Sexually Abused: Not on file    Outpatient Medications Prior to Visit  Medication Sig Dispense Refill  . furosemide (LASIX) 40 MG tablet TAKE 1 TABLET BY MOUTH TWICE A DAY 60 tablet 3  . aspirin 81 MG EC tablet Take 1 tablet (81 mg total) by mouth daily. 90 tablet 3   . atorvastatin (LIPITOR) 80 MG tablet Take 1 tablet (80 mg total) by mouth daily. 90 tablet 3  . Multiple Vitamin (MULTIVITAMIN) tablet Take 1 tablet by mouth daily.    Marland Kitchen NITROSTAT 0.4 MG SL tablet PLACE 1 TABLET UNDER TONGUE AS NEEDED FOR CHEST PAIN EVERY 5 MINUTES X 3 MAX DOSES.  CALL 911 IF PAIN PERSISTS 25 tablet 3  . sacubitril-valsartan (ENTRESTO) 24-26 MG Take 1 tablet by mouth 2 (two) times daily. 180 tablet 3  . albuterol (PROVENTIL HFA;VENTOLIN HFA) 108 (90 Base) MCG/ACT inhaler Inhale 2 puffs into the lungs every 4 (four) hours as needed for wheezing or shortness of breath. 1 Inhaler 2  . carvedilol (COREG) 3.125 MG tablet Take 1 tablet (3.125 mg total) by mouth 2 (two) times daily with a meal. 180 tablet 3  . spironolactone (ALDACTONE) 25 MG tablet Take 0.5 tablets (12.5 mg total) by mouth daily for 30 days. 45 tablet 3   No facility-administered medications prior to visit.    No Known Allergies  Review of Systems  Respiratory: Positive for shortness of breath.   Gastrointestinal: Positive for abdominal distention and abdominal pain.  All other systems reviewed and are negative.      Objective:    Physical Exam Vitals reviewed.  Constitutional:      Appearance: He is well-developed. He is obese.  HENT:     Head: Normocephalic.  Cardiovascular:     Rate and Rhythm: Normal rate and regular rhythm.  Abdominal:     General: Abdomen is protuberant.     Tenderness: There is generalized abdominal tenderness.  Skin:    General: Skin is warm and dry.  Neurological:     Mental Status: He is alert and oriented to person, place, and time.  Psychiatric:        Mood and Affect: Mood normal.        Behavior: Behavior normal.    BP 117/78 (BP Location: Right Arm, Patient Position: Sitting, Cuff Size: Normal)   Pulse 93   Temp (!) 97.3 F (36.3 C) (Temporal)   Ht 5' 10.5" (1.791 m)   Wt 185 lb (83.9 kg)   SpO2 98%   BMI 26.17 kg/m  Wt Readings from Last 3 Encounters:   01/30/20 179 lb (81.2 kg)  01/24/20 185 lb (83.9 kg)  05/06/19 167 lb 8 oz (76 kg)    Health Maintenance Due  Topic Date Due  . COLONOSCOPY  11/12/2015    There are no preventive care reminders to display for this patient.   Lab Results  Component Value Date   TSH 0.382 07/18/2014   Lab Results  Component Value Date   WBC 6.4 05/05/2019   HGB 14.4 05/05/2019   HCT 43.0 05/05/2019   MCV 84.3 05/05/2019   PLT 243 05/05/2019   Lab Results  Component Value Date   NA 138 05/06/2019   K 3.9 05/06/2019   CO2 27 05/06/2019   GLUCOSE 124 (H) 05/06/2019   BUN 24 (H) 05/06/2019   CREATININE 1.35 (H) 05/06/2019   BILITOT 0.9 05/04/2019   ALKPHOS 83 05/04/2019   AST 16 05/04/2019   ALT 19 05/04/2019   PROT 6.5 05/04/2019   ALBUMIN 3.1 (L) 05/04/2019   CALCIUM 8.7 (L) 05/06/2019   ANIONGAP 10 05/06/2019   Lab Results  Component Value Date   CHOL 191 12/31/2016   Lab Results  Component Value Date   HDL 54 12/31/2016   Lab Results  Component Value Date   LDLCALC 112 (H) 12/31/2016   Lab Results  Component Value Date   TRIG 125 12/31/2016   Lab Results  Component Value Date   CHOLHDL 3.5 12/31/2016   Lab Results  Component Value Date   HGBA1C 6.5 (A) 01/24/2020       Assessment & Plan:  Brevin was seen today for abdominal pain and shortness of breath.  Diagnoses and all orders for this visit:  Prediabetes -     HgB A1c 6.5  New diagnosis of Type 2 diabetes . Start with lifestyle modifications diet modification decreasing carbohydrates, rice, potatoes, sweets, soda's and exercise 7mins daily or 150 mins weekly   Tobacco abuse He is aware of increased risk for lung cancer and other respiratory diseases recommend cessation.  This will be reminded at each clinical visit. -     albuterol (VENTOLIN HFA) 108 (90 Base) MCG/ACT inhaler; Inhale 2 puffs into the lungs every 4 (four) hours as needed for wheezing or shortness of breath.  Generalized abdominal  pain Discussed eating small frequent meal, reduction in acidic foods, fried foods ,spicy foods, alcohol caffeine and tobacco and certain medications. Avoid laying down after eating 42mins-1hour, elevated head of the bed.  Shortness of breath May be contributed to CHF weigh daily call if weight gain >/= 5lb . Chest pain , increase shortness of breath proceed to ED or call 911  Need for Tdap vaccination Tdap is recommended every 10 years for adults weekly or primary she gets tetanus..  At least 1 of those doses should be with Tdap in adults age 75 and older who have previously received Tdap.  Recommend by the CDC. -     Tdap vaccine greater than or equal to 7yo IM  Congestive heart failure, unspecified HF chronicity, unspecified heart failure type ([place]) -     Ambulatory referral to Cardiology  Other orders -     omeprazole (PRILOSEC) 20 MG capsule; Take 1 capsule (20 mg total) by mouth daily.    Meds ordered this encounter  Medications  . albuterol (VENTOLIN HFA) 108 (90 Base) MCG/ACT inhaler    Sig: Inhale 2 puffs into the lungs every 4 (four) hours as needed for wheezing or shortness of breath.    Dispense:  6.7 g    Refill:  1  . omeprazole (PRILOSEC) 20 MG capsule    Sig: Take 1 capsule (20 mg total) by mouth daily.    Dispense:  30 capsule    Refill:  [place], NP

## 2020-01-24 NOTE — Patient Instructions (Signed)

## 2020-01-24 NOTE — Progress Notes (Signed)
Pt believes he has an ulcer Pt states he has fluid build up that he believes is causing his SOB

## 2020-01-24 NOTE — Progress Notes (Deleted)
Cardiology Office Note   Date:  01/24/2020   ID:  Tyler Wong, DOB 1966/05/11, MRN [id number]  PCP:  [name], NP  Cardiologist:   Shelva Majestic, MD Referring:  ***  No chief complaint on file.     History of Present Illness: Tyler Wong is a 54 y.o. male who presents for ***    She had an echo in May 2020 and had an EF of 35 - 40%.    There was moderate to severe MR.    Past Medical History:  Diagnosis Date  . CAD in native artery 07/17/14   STEMI- LAD stenosis with DES  . CHF (congestive heart failure) ([place])   . Cocaine abuse ([place])   . COPD (chronic obstructive pulmonary disease) ([place])   . Dyspnea   . Hypertension   . ST elevation myocardial infarction (STEMI) involving left anterior descending (LAD) coronary artery with complication ([place])   . Tobacco abuse 07/17/2014    Past Surgical History:  Procedure Laterality Date  . CORONARY ANGIOPLASTY WITH STENT PLACEMENT  07/18/14   resolute DES to LAD STEMI  . LEFT HEART CATH N/A 07/17/2014   Procedure: LEFT HEART CATH;  Surgeon: [name], MD;  Location: [facility] CATH LAB;  Service: Cardiovascular;  Laterality: N/A;  . PERCUTANEOUS CORONARY STENT INTERVENTION (PCI-S)  07/17/2014   Procedure: PERCUTANEOUS CORONARY STENT INTERVENTION (PCI-S);  Surgeon: [name], MD;  Location: [facility] CATH LAB;  Service: Cardiovascular;;  . RIGHT/LEFT HEART CATH AND CORONARY ANGIOGRAPHY N/A 10/03/2018   Procedure: RIGHT/LEFT HEART CATH AND CORONARY ANGIOGRAPHY;  Surgeon: [name], MD;  Location: [place] CV LAB;  Service: Cardiovascular;  Laterality: N/A;     Current Outpatient Medications  Medication Sig Dispense Refill  . albuterol (VENTOLIN HFA) 108 (90 Base) MCG/ACT inhaler Inhale 2 puffs into the lungs every 4 (four) hours as needed for wheezing or shortness of breath. 6.7 g 1  . aspirin 81 MG EC tablet Take 1 tablet (81 mg total) by mouth daily. 90 tablet 3  . atorvastatin (LIPITOR) 80 MG tablet Take 1 tablet  (80 mg total) by mouth daily. 90 tablet 3  . carvedilol (COREG) 3.125 MG tablet Take 1 tablet (3.125 mg total) by mouth 2 (two) times daily with a meal. 180 tablet 3  . furosemide (LASIX) 40 MG tablet TAKE 1 TABLET BY MOUTH TWICE A DAY 60 tablet 3  . Multiple Vitamin (MULTIVITAMIN) tablet Take 1 tablet by mouth daily.    Marland Kitchen NITROSTAT 0.4 MG SL tablet PLACE 1 TABLET UNDER TONGUE AS NEEDED FOR CHEST PAIN EVERY 5 MINUTES X 3 MAX DOSES.  CALL 911 IF PAIN PERSISTS 25 tablet 3  . omeprazole (PRILOSEC) 20 MG capsule Take 1 capsule (20 mg total) by mouth daily. 30 capsule 3  . sacubitril-valsartan (ENTRESTO) 24-26 MG Take 1 tablet by mouth 2 (two) times daily. 180 tablet 3  . spironolactone (ALDACTONE) 25 MG tablet Take 0.5 tablets (12.5 mg total) by mouth daily for 30 days. 45 tablet 3   No current facility-administered medications for this visit.    Allergies:   Patient has no known allergies.    Social History:  The patient  reports that he has been smoking cigarettes. He has a 15.00 pack-year smoking history. He has never used smokeless tobacco. He reports previous drug use. Drug: Cocaine. He reports that he does not drink alcohol.   Family History:  The patient's ***family history includes Cirrhosis in his father; Heart  attack in his brother, mother, and sister; Heart failure in his sister.    ROS:  Please see the history of present illness.   Otherwise, review of systems are positive for {NONE DEFAULTED:18576::"none"}.   All other systems are reviewed and negative.    PHYSICAL EXAM: VS:  There were no vitals taken for this visit. , BMI There is no height or weight on file to calculate BMI. GENERAL:  Well appearing HEENT:  Pupils equal round and reactive, fundi not visualized, oral mucosa unremarkable NECK:  No jugular venous distention, waveform within normal limits, carotid upstroke brisk and symmetric, no bruits, no thyromegaly LYMPHATICS:  No cervical, inguinal adenopathy LUNGS:  Clear  to auscultation bilaterally BACK:  No CVA tenderness CHEST:  Unremarkable HEART:  PMI not displaced or sustained,S1 and S2 within normal limits, no S3, no S4, no clicks, no rubs, *** murmurs ABD:  Flat, positive bowel sounds normal in frequency in pitch, no bruits, no rebound, no guarding, no midline pulsatile mass, no hepatomegaly, no splenomegaly EXT:  2 plus pulses throughout, no edema, no cyanosis no clubbing SKIN:  No rashes no nodules NEURO:  Cranial nerves II through XII grossly intact, motor grossly intact throughout PSYCH:  Cognitively intact, oriented to person place and time    EKG:  EKG {ACTION; IS/IS [id number] ordered today. The ekg ordered today demonstrates ***   Recent Labs: 05/04/2019: ALT 19; B Natriuretic Peptide 1,206.6 05/05/2019: Hemoglobin 14.4; Platelets 243 05/06/2019: BUN 24; Creatinine, Ser 1.35; Potassium 3.9; Sodium 138    Lipid Panel    Component Value Date/Time   CHOL 191 12/31/2016 0323   TRIG 125 12/31/2016 0323   HDL 54 12/31/2016 0323   CHOLHDL 3.5 12/31/2016 0323   VLDL 25 12/31/2016 0323   LDLCALC 112 (H) 12/31/2016 0323      Wt Readings from Last 3 Encounters:  01/24/20 185 lb (83.9 kg)  05/06/19 167 lb 8 oz (76 kg)  02/28/19 176 lb 3.2 oz (79.9 kg)      Other studies Reviewed: Additional studies/ records that were reviewed today include: ***. Review of the above records demonstrates:  Please see elsewhere in the note.  ***   ASSESSMENT AND PLAN:  ***   Current medicines are reviewed at length with the patient today.  The patient {ACTIONS; HAS/DOES NOT HAVE:19233} concerns regarding medicines.  The following changes have been made:  {PLAN; NO CHANGE:13088:s}  Labs/ tests ordered today include: *** No orders of the defined types were placed in this encounter.    Disposition:   FU with ***    Signed, Minus Breeding, MD  01/24/2020 7:22 PM    [facility]

## 2020-01-25 ENCOUNTER — Ambulatory Visit (INDEPENDENT_AMBULATORY_CARE_PROVIDER_SITE_OTHER): Payer: Medicaid Other | Admitting: Primary Care

## 2020-01-25 ENCOUNTER — Telehealth: Payer: Self-pay | Admitting: *Deleted

## 2020-01-25 ENCOUNTER — Ambulatory Visit: Payer: Medicaid Other | Admitting: Cardiology

## 2020-01-25 NOTE — Telephone Encounter (Signed)
Per Dr Percival Spanish patient needs follow up with Dr Claiborne Billings Patient already scheduled for 2/5

## 2020-01-30 ENCOUNTER — Encounter: Payer: Self-pay | Admitting: Cardiovascular Disease

## 2020-01-30 ENCOUNTER — Ambulatory Visit (INDEPENDENT_AMBULATORY_CARE_PROVIDER_SITE_OTHER): Payer: Medicaid Other | Admitting: Cardiovascular Disease

## 2020-01-30 ENCOUNTER — Other Ambulatory Visit: Payer: Self-pay

## 2020-01-30 DIAGNOSIS — I5043 Acute on chronic combined systolic (congestive) and diastolic (congestive) heart failure: Secondary | ICD-10-CM

## 2020-01-30 DIAGNOSIS — I340 Nonrheumatic mitral (valve) insufficiency: Secondary | ICD-10-CM

## 2020-01-30 DIAGNOSIS — Z9861 Coronary angioplasty status: Secondary | ICD-10-CM

## 2020-01-30 DIAGNOSIS — I255 Ischemic cardiomyopathy: Secondary | ICD-10-CM

## 2020-01-30 DIAGNOSIS — Z720 Tobacco use: Secondary | ICD-10-CM

## 2020-01-30 DIAGNOSIS — I5041 Acute combined systolic (congestive) and diastolic (congestive) heart failure: Secondary | ICD-10-CM | POA: Diagnosis not present

## 2020-01-30 DIAGNOSIS — E785 Hyperlipidemia, unspecified: Secondary | ICD-10-CM

## 2020-01-30 DIAGNOSIS — I10 Essential (primary) hypertension: Secondary | ICD-10-CM | POA: Diagnosis not present

## 2020-01-30 DIAGNOSIS — Z79899 Other long term (current) drug therapy: Secondary | ICD-10-CM

## 2020-01-30 DIAGNOSIS — I2510 Atherosclerotic heart disease of native coronary artery without angina pectoris: Secondary | ICD-10-CM

## 2020-01-30 DIAGNOSIS — F1410 Cocaine abuse, uncomplicated: Secondary | ICD-10-CM

## 2020-01-30 DIAGNOSIS — I5042 Chronic combined systolic (congestive) and diastolic (congestive) heart failure: Secondary | ICD-10-CM | POA: Diagnosis not present

## 2020-01-30 MED ORDER — FUROSEMIDE 40 MG PO TABS: 40.0000 mg | ORAL_TABLET | Freq: Two times a day (BID) | ORAL | 3 refills | Status: DC

## 2020-01-30 MED ORDER — DIGOXIN 125 MCG PO TABS: 0.0625 mg | ORAL_TABLET | Freq: Every day | ORAL | 3 refills | Status: DC

## 2020-01-30 MED ORDER — SPIRONOLACTONE 25 MG PO TABS: 12.5000 mg | ORAL_TABLET | Freq: Every day | ORAL | 3 refills | Status: DC

## 2020-01-30 MED ORDER — CARVEDILOL 6.25 MG PO TABS: 6.2500 mg | ORAL_TABLET | Freq: Two times a day (BID) | ORAL | 3 refills | Status: DC

## 2020-01-30 MED FILL — CARVEDILOL 6.25 MG TABLET: 6.25 | 90 days supply | Qty: 180 | Fill #0

## 2020-01-30 MED FILL — SPIRONOLACTONE 25 MG TABS: 25 | 90 days supply | Qty: 45 | Fill #0

## 2020-01-30 MED FILL — DIGOXIN 0.125 MG TABLET: 125 | 90 days supply | Qty: 45 | Fill #0

## 2020-01-30 NOTE — Progress Notes (Signed)
Patient ID: Tyler Wong, male   DOB: [birth year], 54 y.o.   MRN: [id number]     HPI: Tyler Wong is a 54 y.o. male who presents to the office today for a 55 month cardiology evaluation.  I last saw him in October 2018.  Tyler Wong is an African American male who denied any significant prior cardiac history and presented to [facility] in the middle of the night of 07/17/2014 with an ST segment elevation anterior wall myocardial infarction.  The patient had admitted to cocaine use the day before.  I performed emergent cardiac catheterization and he was found to have total proximal to mid LAD occlusion.  In a normal ramus intermediate, a dominant left circumflex, and a nondominant RCA.  There was mild to moderate acute left ventricular dysfunction involving the mid distal anterolateral wall and apex as well as distal inferoapical segment with initial acute ejection fraction of 45%.  He underwent successful DES stenting of his LAD and a Resolute integrity 3.5x34 mm stent was inserted, postdilated to 3.9 mm, tapering to 3.7, 8 mm.  Subsequently, he was started on low-dose ACE inhibitor in addition to beta blocker therapy and statin therapy.  He saw Ellen Henri for initial evaluation, and was doing well.  Since his initial MI, I only seen him once in the office in September 2015.  Subsequently he had  numerous readmissions for recurrent anginal symptoms and was admitted in January 2 hospitalizations with acute on chronic combined CHF.  He also again tested positive for cocaine.  An echo Doppler study showed an EF of 30-35% on 12/31/2016.  He was readmitted in late January with CHF exacerbation, and again has had several evaluations in June, July, and several in August.  I had last seen him in October 2018 at which time he still admitted to occasional cocaine use.  He was taking furosemide up to 40 mg if he feels he is getting short of breath but has not been taking this daily.  He was also on atorvastatin  80 mg, carvedilol 3.125 mg twice a day, lisinopril 10 mg.  During that evaluation I had a lengthy discussion with he and his sister and discussed the vasospasm often associated with cocaine and potential for subsequent MI and death.  I initiated aldosterone blockade with spironolactone initially at 12.5 mg with plans to titrate up to twice a day.  Complete set of laboratory was recommended as well as a follow-up echo.  I have not seen Tyler Wong since the 2019 evaluation.  Unfortunately, he had immediately used cocaine although significantly less than previously.  He was hospitalized on several occasions and in October 2019 underwent right and left heart catheterization by Dr. Aundra Dubin which showed elevated left heart filling pressure with LVEDP at 26 mm.  He had preserved cardiac output.  He had primarily pulmonary venous hypertension.  It was 70% PLV stenosis of a small nondominant RCA and 90% ostial moderate OM1 stenosis.  His LAD was patent.  His most recent hospitalization was in  May 2020 when again he was readmitted with acute on chronic systolic and diastolic heart failure.  He was seen in the advanced heart failure clinic post hospitalization.  Presently, he admits to occasional shortness of breath and feels that there is fluid and pressure in his stomach.  He states he rarely uses cocaine but admits to perhaps 1 time per month and he smokes a pack cigarettes every 4 days.  He has been on atorvastatin 80 mg,  carvedilol 3.125 twice a day, furosemide 40 mg twice a day, and low-dose Entresto 24/26 twice a day.  Admits to short of breath with activity.  He last echo was during his May 2020 hospitalization which showed slightly improved LV function with EF 35 to 40%.  There was biatrial enlargement with left atrium and moderate and right atrial mildly dilated.  There was moderate to severe mitral regurgitation noted at Doppler.  He had mild aortic valve sclerosis.  He presents for in office cardiology  evaluation.   Past Medical History:  Diagnosis Date  . CAD in native artery 07/17/14   STEMI- LAD stenosis with DES  . CHF (congestive heart failure) ([place])   . Cocaine abuse ([place])   . COPD (chronic obstructive pulmonary disease) ([place])   . Dyspnea   . Hypertension   . ST elevation myocardial infarction (STEMI) involving left anterior descending (LAD) coronary artery with complication ([place])   . Tobacco abuse 07/17/2014    Past Surgical History:  Procedure Laterality Date  . CORONARY ANGIOPLASTY WITH STENT PLACEMENT  07/18/14   resolute DES to LAD STEMI  . LEFT HEART CATH N/A 07/17/2014   Procedure: LEFT HEART CATH;  Surgeon: [name], MD;  Location: [facility] CATH LAB;  Service: Cardiovascular;  Laterality: N/A;  . PERCUTANEOUS CORONARY STENT INTERVENTION (PCI-S)  07/17/2014   Procedure: PERCUTANEOUS CORONARY STENT INTERVENTION (PCI-S);  Surgeon: [name], MD;  Location: [facility] CATH LAB;  Service: Cardiovascular;;  . RIGHT/LEFT HEART CATH AND CORONARY ANGIOGRAPHY N/A 10/03/2018   Procedure: RIGHT/LEFT HEART CATH AND CORONARY ANGIOGRAPHY;  Surgeon: [name], MD;  Location: [place] CV LAB;  Service: Cardiovascular;  Laterality: N/A;    No Known Allergies  Current Outpatient Medications  Medication Sig Dispense Refill  . albuterol (VENTOLIN HFA) 108 (90 Base) MCG/ACT inhaler Inhale 2 puffs into the lungs every 4 (four) hours as needed for wheezing or shortness of breath. 6.7 g 1  . aspirin 81 MG EC tablet Take 1 tablet (81 mg total) by mouth daily. 90 tablet 3  . atorvastatin (LIPITOR) 80 MG tablet Take 1 tablet (80 mg total) by mouth daily. 90 tablet 3  . furosemide (LASIX) 40 MG tablet Take 1 tablet (40 mg total) by mouth 2 (two) times daily. 180 tablet 3  . Multiple Vitamin (MULTIVITAMIN) tablet Take 1 tablet by mouth daily.    Marland Kitchen NITROSTAT 0.4 MG SL tablet PLACE 1 TABLET UNDER TONGUE AS NEEDED FOR CHEST PAIN EVERY 5 MINUTES X 3 MAX DOSES.  CALL 911 IF PAIN PERSISTS 25  tablet 3  . omeprazole (PRILOSEC) 20 MG capsule Take 1 capsule (20 mg total) by mouth daily. 30 capsule 3  . sacubitril-valsartan (ENTRESTO) 24-26 MG Take 1 tablet by mouth 2 (two) times daily. 180 tablet 3  . carvedilol (COREG) 6.25 MG tablet Take 1 tablet (6.25 mg total) by mouth 2 (two) times daily with a meal. 180 tablet 3  . digoxin (LANOXIN) 0.125 MG tablet Take 0.5 tablets (0.0625 mg total) by mouth daily. 45 tablet 3  . spironolactone (ALDACTONE) 25 MG tablet Take 0.5 tablets (12.5 mg total) by mouth daily. 45 tablet 3   No current facility-administered medications for this visit.    Social History   Socioeconomic History  . Marital status: Single    Spouse name: Not on file  . Number of children: 2  . Years of education: 40  . Highest education level: 12th grade  Occupational History  . Occupation: Biomedical scientist  Tobacco Use  . Smoking status: Current Every Day Smoker    Packs/day: 0.50    Years: 30.00    Pack years: 15.00    Types: Cigarettes  . Smokeless tobacco: Never Used  . Tobacco comment: .5 PK PER DAY  Substance and Sexual Activity  . Alcohol use: No    Comment: Patient denies abuse, states social drinker  . Drug use: Not Currently    Types: Cocaine    Comment: heroin  . Sexual activity: Yes  Other Topics Concern  . Not on file  Social History Narrative   Lives in [place] with his sister and girlfriend. He works Aeronautical engineer work.    Social Determinants of Health   Financial Resource Strain:   . Difficulty of Paying Living Expenses: Not on file  Food Insecurity:   . Worried About Charity fundraiser in the Last Year: Not on file  . Ran Out of Food in the Last Year: Not on file  Transportation Needs:   . Lack of Transportation (Medical): Not on file  . Lack of Transportation (Non-Medical): Not on file  Physical Activity:   . Days of Exercise per Week: Not on file  . Minutes of Exercise per Session: Not on file  Stress:   . Feeling  of Stress : Not on file  Social Connections:   . Frequency of Communication with Friends and Family: Not on file  . Frequency of Social Gatherings with Friends and Family: Not on file  . Attends Religious Services: Not on file  . Active Member of Clubs or Organizations: Not on file  . Attends Archivist Meetings: Not on file  . Marital Status: Not on file  Intimate Partner Violence:   . Fear of Current or Ex-Partner: Not on file  . Emotionally Abused: Not on file  . Physically Abused: Not on file  . Sexually Abused: Not on file    Family History  Problem Relation Age of Onset  . Cirrhosis Father   . Heart attack Mother   . Heart attack Sister   . Heart failure Sister   . Heart attack Brother     ROS General: Negative; No fevers, chills, or night sweats HEENT: Negative; No changes in vision or hearing, sinus congestion, difficulty swallowing Pulmonary: Negative; No cough, wheezing, shortness of breath, hemoptysis Cardiovascular: See history of present illness GI: Negative; No nausea, vomiting, diarrhea, or abdominal pain GU: Negative; No dysuria, hematuria, or difficulty voiding Musculoskeletal: Negative; no myalgias, joint pain, or weakness Hematologic: Negative; no easy bruising, bleeding Endocrine: Negative; no heat/cold intolerance; no diabetes, Neuro: Negative; no changes in balance, headaches Skin: Negative; No rashes or skin lesions Psychiatric: Positive for frequent dependence with cocaine Sleep: Negative; No snoring,  daytime sleepiness, hypersomnolence, bruxism, restless legs, hypnogognic hallucinations. Other comprehensive 14 point system review is negative    Physical Exam BP 106/80   Pulse 97   Ht 5' 10"  (1.778 m)   Wt 179 lb (81.2 kg)   SpO2 97%   BMI 25.68 kg/m    Repeat blood pressure by me was 104/80  Wt Readings from Last 3 Encounters:  01/30/20 179 lb (81.2 kg)  01/24/20 185 lb (83.9 kg)  05/06/19 167 lb 8 oz (76 kg)   General:  Alert, oriented, no distress.  Skin: normal turgor, no rashes, warm and dry HEENT: Normocephalic, atraumatic. Pupils equal round and reactive to light; sclera anicteric; extraocular muscles intact;  Nose without nasal septal hypertrophy Mouth/Parynx benign;  Mallinpatti scale 3 Neck: Mildly elevated JVD at 8 cm no carotid bruits; normal carotid upstroke Lungs: clear to ausculatation and percussion; no wheezing or rales Chest wall: without tenderness to palpitation Heart: PMI not displaced, RRR, s1 s2 normal, 3/6 harsh systolic murmur at the apex radiating to the axilla consistent with MR, no diastolic murmur, no rubs, gallops, thrills, or heaves Abdomen: soft, nontender; no hepatosplenomehaly, BS+; abdominal aorta nontender and not dilated by palpation. Back: no CVA tenderness Pulses 2+ Musculoskeletal: full range of motion, normal strength, no joint deformities Extremities: no clubbing cyanosis or edema, Homan's sign negative  Neurologic: grossly nonfocal; Cranial nerves grossly wnl Psychologic: Normal mood and affect   ECG (independently read by me): Normal sinus rhythm with an isolated PAC.  Biatrial enlargement.  Left anterior hemiblock.  QS complex V1 through V5 consistent with old anterior MI.  QTc interval 497 ms  October 2018 ECG (independently read by me): Normal sinus rhythm.  Q waves V1 through V4 with T wave inversion across the precordium, concordant with his recent anterior wall myocardial infarction.  LABS: BMP Latest Ref Rng & Units 01/30/2020 05/06/2019 05/05/2019  Glucose 65 - 99 mg/dL 101(H) 124(H) 116(H)  BUN 6 - 24 mg/dL 23 24(H) 24(H)  Creatinine 0.76 - 1.27 mg/dL 1.55(H) 1.35(H) 1.30(H)  BUN/Creat Ratio 9 - 20 15 - -  Sodium 134 - 144 mmol/L 141 138 139  Potassium 3.5 - 5.2 mmol/L 4.1 3.9 3.8  Chloride 96 - 106 mmol/L 99 101 100  CO2 20 - 29 mmol/L 25 27 27   Calcium 8.7 - 10.2 mg/dL 9.5 8.7(L) 8.6(L)   Hepatic Function Latest Ref Rng & Units 01/30/2020 05/04/2019  10/02/2018  Total Protein 6.0 - 8.5 g/dL 7.1 6.5 -  Albumin 3.8 - 4.9 g/dL 4.1 3.1(L) 3.0(L)  AST 0 - 40 IU/L 21 16 -  ALT 0 - 44 IU/L 26 19 -  Alk Phosphatase 39 - 117 IU/L 112 83 -  Total Bilirubin 0.0 - 1.2 mg/dL 0.6 0.9 -  Bilirubin, Direct 0.0 - 0.2 mg/dL - - -   CBC Latest Ref Rng & Units 01/30/2020 05/05/2019 05/04/2019  WBC 3.4 - 10.8 x10E3/uL 8.6 6.4 7.9  Hemoglobin 13.0 - 17.7 g/dL 14.1 14.4 14.0  Hematocrit 37.5 - 51.0 % 45.5 43.0 42.4  Platelets 150 - 450 x10E3/uL 302 243 243   Lab Results  Component Value Date   MCV 83 01/30/2020   MCV 84.3 05/05/2019   MCV 86.5 05/04/2019   Lab Results  Component Value Date   TSH 1.920 01/30/2020   Lipid Panel     Component Value Date/Time   CHOL 191 12/31/2016 0323   TRIG 125 12/31/2016 0323   HDL 54 12/31/2016 0323   CHOLHDL 3.5 12/31/2016 0323   VLDL 25 12/31/2016 0323   LDLCALC 112 (H) 12/31/2016 0323   RADIOLOGY: No results found.  IMPRESSION:  1. CAD S/P anterior MI 2015 with DES stent to LAD   2. Ischemic cardiomyopathy   3. Acute on chronic combined systolic (congestive) and diastolic (congestive) heart failure ([place])   4. Essential hypertension   5. Moderate to severe mitral regurgitation   6. Hyperlipidemia, unspecified hyperlipidemia type   7. Cocaine abuse ([place])   8. Tobacco abuse   9. Medication management     ASSESSMENT AND PLAN: Tyler Wong is a 54 year old African-American male who suffered an anterior wall myocardial infarction most likely contributed by cocaine use in July 2015.  He underwent successful DES stenting  to a totally occluded LAD.  He has subsequently undergone cardiac catheterization which has shown his LAD to be patent and he had 70% stenosis in the PLV branch of a nondominant RCA and 90% ostial moderate OM1 stenosis at catheterization by Dr. Aundra Dubin in 2019 for which medical therapy was recommended.  Unfortunately, he had continued to use cocaine over the last several years.  He now admits  that uses approximately 1 time per month.  I have not seen him since my evaluation in the office in October 2018.  On exam today he has a significant murmur of mitral regurgitation.  His last echo Doppler study in May 2020 which was done in the setting of cocaine use and medication noncompliance after running out of spironolactone did show some slight improvement in EF at 35 to 40%.  He apparently is no longer taking spironolactone.  His ventricular rate is elevated at 97 bpm.  Presently, I am adding low-dose digoxin at 0.0 6.25 mg and then resuming spironolactone 12.5 mg daily.  In 1 week, I have recommended slight titration of carvedilol to 1-1/2 pills of his 3.125 mg tablet and after 1 week if he feels improved pulse is still elevated to further titrate to 6.25 mg twice a day.  His blood pressure is too low today to further titrate Entresto.  In the future he may be a candidate for [place] or [place].  However I am concerned about his significant MR murmur and recommending a follow-up 2D echo Doppler study for reassessment.  If LV function remains further reduced and significant MR is confirmed, he most likely will need TEE with subsequent consultation for mitral valve clip consideration.  I have asked that he see Joslyn Hy, Pharm.D. in 2 weeks with planned follow-up with me in 4 weeks   Time spent: 35 minutes [name], MD, [facility]  01/31/2020 4:40 PM

## 2020-01-30 NOTE — Patient Instructions (Addendum)
Medication Instructions:  RESUME TAKING SPIRONOLACTONE 12.5 MG DAILY = 1/2 TABLET  BEGIN TAKING DIGOXIN 0.0625 MG DAILY = 1/2 TABLET    BEGIN TAKING CARVEDILOL( START BY TAKING 1 (3.125MG ) AND 1/2(1.5625 MG) TABLET 2TIMES DAILY FOR 1 WEEK) THEN INCREASE TO 1 (6.25 MG) TABLET 2 TIMES DAILY  *If you need a refill on your cardiac medications before your next appointment, please call your pharmacy*  Lab Work: CMET, PRO BNP, TSH, CBC TODAY If you have labs (blood work) drawn today and your tests are completely normal, you will receive your results only by: Marland Kitchen MyChart Message (if you have MyChart) OR . A paper copy in the mail If you have any lab test that is abnormal or we need to change your treatment, we will call you to review the results.  Testing/Procedures: Your physician has requested that you have an echocardiogram. Echocardiography is a painless test that uses sound waves to create images of your heart. It provides your doctor with information about the size and shape of your heart and how well your heart's chambers and valves are working. This procedure takes approximately one hour. There are no restrictions for this procedure.  [place]  Follow-Up: At [facility], you and your health needs are our priority.  As part of our continuing mission to provide you with exceptional heart care, we have created designated Provider Care Teams.  These Care Teams include your primary Cardiologist (physician) and Advanced Practice Providers (APPs -  Physician Assistants and Nurse Practitioners) who all work together to provide you with the care you need, when you need it.  Your next appointment:   4 week(s)  The format for your next appointment:   In Person  Provider:   Shelva Majestic, MD  Other Instructions SEE KRISTIN IN PHARMACY IN 2 WEEKS FOR HTN CLINIC/MED REC.

## 2020-01-31 ENCOUNTER — Encounter: Payer: Self-pay | Admitting: Cardiovascular Disease

## 2020-01-31 LAB — CBC
Hematocrit: 45.5 % (ref 37.5–51.0)
Hemoglobin: 14.1 g/dL (ref 13.0–17.7)
MCH: 25.7 pg — ABNORMAL LOW (ref 26.6–33.0)
MCHC: 31 g/dL — ABNORMAL LOW (ref 31.5–35.7)
MCV: 83 fL (ref 79–97)
Platelets: 302 10*3/uL (ref 150–450)
RBC: 5.48 x10E6/uL (ref 4.14–5.80)
RDW: 18.1 % — ABNORMAL HIGH (ref 11.6–15.4)
WBC: 8.6 10*3/uL (ref 3.4–10.8)

## 2020-01-31 LAB — COMPREHENSIVE METABOLIC PANEL
ALT: 26 IU/L (ref 0–44)
AST: 21 IU/L (ref 0–40)
Albumin/Globulin Ratio: 1.4 (ref 1.2–2.2)
Albumin: 4.1 g/dL (ref 3.8–4.9)
Alkaline Phosphatase: 112 IU/L (ref 39–117)
BUN/Creatinine Ratio: 15 (ref 9–20)
BUN: 23 mg/dL (ref 6–24)
Bilirubin Total: 0.6 mg/dL (ref 0.0–1.2)
CO2: 25 mmol/L (ref 20–29)
Calcium: 9.5 mg/dL (ref 8.7–10.2)
Chloride: 99 mmol/L (ref 96–106)
Creatinine, Ser: 1.55 mg/dL — ABNORMAL HIGH (ref 0.76–1.27)
GFR calc Af Amer: 58 mL/min/{1.73_m2} — ABNORMAL LOW (ref >59)
GFR calc non Af Amer: 50 mL/min/{1.73_m2} — ABNORMAL LOW (ref >59)
Globulin, Total: 3 g/dL (ref 1.5–4.5)
Glucose: 101 mg/dL — ABNORMAL HIGH (ref 65–99)
Potassium: 4.1 mmol/L (ref 3.5–5.2)
Sodium: 141 mmol/L (ref 134–144)
Total Protein: 7.1 g/dL (ref 6.0–8.5)

## 2020-01-31 LAB — PRO B NATRIURETIC PEPTIDE: NT-Pro BNP: 1759 pg/mL — ABNORMAL HIGH (ref 0–121)

## 2020-01-31 LAB — TSH: TSH: 1.92 u[IU]/mL (ref 0.450–4.500)

## 2020-02-02 ENCOUNTER — Ambulatory Visit: Payer: Medicaid Other | Admitting: Cardiovascular Disease

## 2020-02-08 MED FILL — SPIRONOLACTONE 25 MG TABS: 25 | 90 days supply | Qty: 45 | Fill #0

## 2020-02-08 MED FILL — FUROSEMIDE 40 MG TAB: 40 | 90 days supply | Qty: 180 | Fill #0

## 2020-02-08 MED FILL — DIGOXIN 0.125 MG TABLET: 125 | 90 days supply | Qty: 45 | Fill #0

## 2020-02-08 MED FILL — CARVEDILOL 6.25 MG TABLET: 6.25 | 90 days supply | Qty: 180 | Fill #0

## 2020-02-09 ENCOUNTER — Ambulatory Visit (HOSPITAL_COMMUNITY): Payer: Medicaid Other | Attending: Internal Medicine

## 2020-02-09 ENCOUNTER — Other Ambulatory Visit: Payer: Self-pay

## 2020-02-09 ENCOUNTER — Other Ambulatory Visit (HOSPITAL_COMMUNITY): Payer: Medicaid Other

## 2020-02-09 DIAGNOSIS — I10 Essential (primary) hypertension: Secondary | ICD-10-CM | POA: Diagnosis not present

## 2020-02-09 DIAGNOSIS — I5043 Acute on chronic combined systolic (congestive) and diastolic (congestive) heart failure: Secondary | ICD-10-CM | POA: Insufficient documentation

## 2020-02-09 DIAGNOSIS — E785 Hyperlipidemia, unspecified: Secondary | ICD-10-CM | POA: Insufficient documentation

## 2020-02-15 ENCOUNTER — Other Ambulatory Visit (HOSPITAL_COMMUNITY): Payer: Medicaid Other

## 2020-02-22 ENCOUNTER — Telehealth: Payer: Self-pay | Admitting: Cardiovascular Disease

## 2020-02-22 DIAGNOSIS — Z79899 Other long term (current) drug therapy: Secondary | ICD-10-CM

## 2020-02-22 NOTE — Telephone Encounter (Signed)
New Message  Pt was calling back to receive his echo results.  Please call to go over

## 2020-02-22 NOTE — Telephone Encounter (Signed)
Patient called w/echo & lab results. Advised to decrease lasix in half from 40mg  BID to 40mg  QD per Dr. Claiborne Billings and repeat BMET next week, if possible before 3/4 OV with MD

## 2020-02-29 ENCOUNTER — Ambulatory Visit: Payer: Medicaid Other | Admitting: Cardiovascular Disease

## 2020-03-19 ENCOUNTER — Encounter: Payer: Self-pay | Admitting: Cardiovascular Disease

## 2020-03-28 ENCOUNTER — Telehealth: Payer: Self-pay | Admitting: Cardiovascular Disease

## 2020-03-28 NOTE — Telephone Encounter (Signed)
Agree with recommendations, thanks.

## 2020-03-28 NOTE — Telephone Encounter (Signed)
Spoke with Chrys Racer (mother of pts children) she states that pt is having trouble breathing, he is constipated, not eating, "talking out his head" like hallucinations, restless, coughing, and cannot catch his breath.Denies mention of chest pain. They know he missed an OV with Dr.Kelly on 3/4. But per Chrys Racer he has been at her home for 4 days sick and he is scared.  Spoke with DOD Dr.Jordan and he recommended immediate evaluation in the emergency room.   Notified Chrys Racer of Dr.Jordan's advice that Shakim needs to be seen in the ER for evaluation immediately. Also told them to keep his up coming appt with Dr.Kelly as well. She verbalized understanding and that she would take him to the ER. No other questions at this time.

## 2020-03-28 NOTE — Telephone Encounter (Signed)
New Message  Pt c/o Shortness Of Breath: STAT if SOB developed within the last 24 hours or pt is noticeably SOB on the phone  1. Are you currently SOB (can you hear that pt is SOB on the phone)? Yes  2. How long have you been experiencing SOB? 4 days  3. Are you SOB when sitting or when up moving around? Both  4. Are you currently experiencing any other symptoms? Coughing, hallucinating, constipated, not eating, SOB.  Patient went and had covid vaccine yesterday.

## 2020-04-01 ENCOUNTER — Encounter (HOSPITAL_COMMUNITY): Payer: Self-pay | Admitting: *Deleted

## 2020-04-01 ENCOUNTER — Emergency Department (HOSPITAL_COMMUNITY): Payer: Medicaid Other

## 2020-04-01 ENCOUNTER — Emergency Department (HOSPITAL_COMMUNITY)
Admission: EM | Admit: 2020-04-01 | Discharge: 2020-04-01 | Discharge status: Against Medical Advice | Payer: Medicaid Other | Attending: Emergency Medicine | Admitting: Emergency Medicine

## 2020-04-01 ENCOUNTER — Other Ambulatory Visit: Payer: Self-pay

## 2020-04-01 DIAGNOSIS — I110 Hypertensive heart disease with heart failure: Secondary | ICD-10-CM | POA: Diagnosis not present

## 2020-04-01 DIAGNOSIS — I2510 Atherosclerotic heart disease of native coronary artery without angina pectoris: Secondary | ICD-10-CM | POA: Diagnosis not present

## 2020-04-01 DIAGNOSIS — Z7982 Long term (current) use of aspirin: Secondary | ICD-10-CM | POA: Insufficient documentation

## 2020-04-01 DIAGNOSIS — Z955 Presence of coronary angioplasty implant and graft: Secondary | ICD-10-CM | POA: Diagnosis not present

## 2020-04-01 DIAGNOSIS — Z5320 Procedure and treatment not carried out because of patient's decision for unspecified reasons: Secondary | ICD-10-CM | POA: Insufficient documentation

## 2020-04-01 DIAGNOSIS — R778 Other specified abnormalities of plasma proteins: Secondary | ICD-10-CM | POA: Insufficient documentation

## 2020-04-01 DIAGNOSIS — R05 Cough: Secondary | ICD-10-CM | POA: Insufficient documentation

## 2020-04-01 DIAGNOSIS — R0602 Shortness of breath: Secondary | ICD-10-CM | POA: Diagnosis not present

## 2020-04-01 DIAGNOSIS — I5043 Acute on chronic combined systolic (congestive) and diastolic (congestive) heart failure: Secondary | ICD-10-CM | POA: Diagnosis not present

## 2020-04-01 DIAGNOSIS — R7989 Other specified abnormal findings of blood chemistry: Secondary | ICD-10-CM

## 2020-04-01 DIAGNOSIS — E875 Hyperkalemia: Secondary | ICD-10-CM | POA: Diagnosis not present

## 2020-04-01 DIAGNOSIS — N289 Disorder of kidney and ureter, unspecified: Secondary | ICD-10-CM

## 2020-04-01 DIAGNOSIS — R000 Tachycardia, unspecified: Secondary | ICD-10-CM | POA: Diagnosis not present

## 2020-04-01 DIAGNOSIS — Z79899 Other long term (current) drug therapy: Secondary | ICD-10-CM | POA: Diagnosis not present

## 2020-04-01 DIAGNOSIS — R7401 Elevation of levels of liver transaminase levels: Secondary | ICD-10-CM

## 2020-04-01 DIAGNOSIS — K5900 Constipation, unspecified: Secondary | ICD-10-CM | POA: Insufficient documentation

## 2020-04-01 DIAGNOSIS — R079 Chest pain, unspecified: Secondary | ICD-10-CM | POA: Diagnosis not present

## 2020-04-01 DIAGNOSIS — I252 Old myocardial infarction: Secondary | ICD-10-CM | POA: Diagnosis not present

## 2020-04-01 DIAGNOSIS — F17210 Nicotine dependence, cigarettes, uncomplicated: Secondary | ICD-10-CM | POA: Diagnosis not present

## 2020-04-01 DIAGNOSIS — R1013 Epigastric pain: Secondary | ICD-10-CM | POA: Diagnosis not present

## 2020-04-01 DIAGNOSIS — J449 Chronic obstructive pulmonary disease, unspecified: Secondary | ICD-10-CM | POA: Diagnosis not present

## 2020-04-01 LAB — BASIC METABOLIC PANEL
Anion gap: 13 (ref 5–15)
BUN: 21 mg/dL — ABNORMAL HIGH (ref 6–20)
CO2: 27 mmol/L (ref 22–32)
Calcium: 8.6 mg/dL — ABNORMAL LOW (ref 8.9–10.3)
Chloride: 97 mmol/L — ABNORMAL LOW (ref 98–111)
Creatinine, Ser: 1.58 mg/dL — ABNORMAL HIGH (ref 0.61–1.24)
GFR calc Af Amer: 57 mL/min — ABNORMAL LOW (ref >60)
GFR calc non Af Amer: 49 mL/min — ABNORMAL LOW (ref >60)
Glucose, Bld: 147 mg/dL — ABNORMAL HIGH (ref 70–99)
Potassium: 3.4 mmol/L — ABNORMAL LOW (ref 3.5–5.1)
Sodium: 137 mmol/L (ref 135–145)

## 2020-04-01 LAB — BRAIN NATRIURETIC PEPTIDE: B Natriuretic Peptide: 773.7 pg/mL — ABNORMAL HIGH (ref 0.0–100.0)

## 2020-04-01 LAB — HEPATIC FUNCTION PANEL
ALT: 66 U/L — ABNORMAL HIGH (ref 0–44)
AST: 28 U/L (ref 15–41)
Albumin: 3 g/dL — ABNORMAL LOW (ref 3.5–5.0)
Alkaline Phosphatase: 84 U/L (ref 38–126)
Bilirubin, Direct: 0.2 mg/dL (ref 0.0–0.2)
Indirect Bilirubin: 0.6 mg/dL (ref 0.3–0.9)
Total Bilirubin: 0.8 mg/dL (ref 0.3–1.2)
Total Protein: 7.1 g/dL (ref 6.5–8.1)

## 2020-04-01 LAB — LIPASE, BLOOD: Lipase: 50 U/L (ref 11–51)

## 2020-04-01 LAB — CBC
HCT: 41.3 % (ref 39.0–52.0)
Hemoglobin: 13.3 g/dL (ref 13.0–17.0)
MCH: 27.5 pg (ref 26.0–34.0)
MCHC: 32.2 g/dL (ref 30.0–36.0)
MCV: 85.3 fL (ref 80.0–100.0)
Platelets: 275 10*3/uL (ref 150–400)
RBC: 4.84 MIL/uL (ref 4.22–5.81)
RDW: 19.3 % — ABNORMAL HIGH (ref 11.5–15.5)
WBC: 7.2 10*3/uL (ref 4.0–10.5)
nRBC: 0 % (ref 0.0–0.2)

## 2020-04-01 LAB — TROPONIN I (HIGH SENSITIVITY)
Troponin I (High Sensitivity): 111 ng/L (ref <18)
Troponin I (High Sensitivity): 104 ng/L (ref <18)

## 2020-04-01 MED ORDER — ONDANSETRON HCL 4 MG/2ML IJ SOLN
4.0000 mg | Freq: Once | INTRAMUSCULAR | Status: DC
  Filled 2020-04-01: qty 2

## 2020-04-01 MED ORDER — PANTOPRAZOLE SODIUM 40 MG PO TBEC
40.0000 mg | DELAYED_RELEASE_TABLET | Freq: Once | ORAL | Status: AC
  Administered 2020-04-01: 40 mg via ORAL
  Filled 2020-04-01: qty 1

## 2020-04-01 MED ORDER — FUROSEMIDE 10 MG/ML IJ SOLN
40.0000 mg | Freq: Once | INTRAMUSCULAR | Status: AC
  Administered 2020-04-01: 40 mg via INTRAVENOUS
  Filled 2020-04-01: qty 4

## 2020-04-01 MED ORDER — POTASSIUM CHLORIDE CRYS ER 20 MEQ PO TBCR
40.0000 meq | EXTENDED_RELEASE_TABLET | Freq: Once | ORAL | Status: AC
  Administered 2020-04-01: 40 meq via ORAL
  Filled 2020-04-01: qty 2

## 2020-04-01 MED ORDER — ALUM & MAG HYDROXIDE-SIMETH 200-200-20 MG/5ML PO SUSP
30.0000 mL | Freq: Once | ORAL | Status: AC
  Administered 2020-04-01: 30 mL via ORAL
  Filled 2020-04-01: qty 30

## 2020-04-01 MED ORDER — SODIUM CHLORIDE 0.9% FLUSH: 3.0000 mL | Freq: Once | INTRAVENOUS | Status: DC

## 2020-04-01 MED ORDER — LIDOCAINE VISCOUS HCL 2 % MT SOLN
15.0000 mL | Freq: Once | OROMUCOSAL | Status: AC
  Administered 2020-04-01: 15 mL via ORAL
  Filled 2020-04-01: qty 15

## 2020-04-01 NOTE — ED Notes (Signed)
Rounded on patient who was taking off leads and expressed desire to leave. Dr Angus Seller informed, stated that patient did not meet criteria for admission and that he would come and talk to patient when available but that patient would be discharged. Pt was informed of this

## 2020-04-01 NOTE — ED Notes (Signed)
Patient has left room and wants to leave. Patient seen walking out of room to ED main entrance. Patients IV removed. MD Tegeler notified.

## 2020-04-01 NOTE — ED Triage Notes (Signed)
The pt has had chest pain for one week with some sob for 2 days he has a cold  Chills no temp cocaine use

## 2020-04-01 NOTE — ED Notes (Signed)
Patient D/C AMA

## 2020-04-01 NOTE — ED Provider Notes (Signed)
[facility] EMERGENCY DEPARTMENT Provider Note   CSN: [id number] Arrival date & time: 04/01/20  0416     History Chief Complaint  Patient presents with  . Chest Pain  . Shortness of Breath    Saket Papini is a 54 y.o. male.  The history is provided by the patient.  Chest Pain Associated symptoms: shortness of breath   Shortness of Breath Associated symptoms: chest pain   He has history of hypertension, coronary artery disease, combined systolic and diastolic heart failure who comes in with worsening dyspnea.  He has been having worsening shortness of breath over the last 3 weeks.  This is worse when he lays flat.  There is a mild, chronic cough which is nonproductive.  He denies fever, chills, sweats.  He has also been having some intermittent pressure feeling in the left anterior chest.  This also seems to come on more when he lays flat and is not exertional.  He has chronic nausea but has not vomited and nausea is not any worse with current symptoms.  He denies diaphoresis.  He is also complaining of problems with constipation and epigastric pain.  He had been taking omeprazole for acid reflux, but had stopped taking it.  Of note, he does have history of cocaine abuse in the past, but denies current cocaine use.  Past Medical History:  Diagnosis Date  . CAD in native artery 07/17/14   STEMI- LAD stenosis with DES  . CHF (congestive heart failure) ([place])   . Cocaine abuse ([place])   . COPD (chronic obstructive pulmonary disease) ([place])   . Dyspnea   . Hypertension   . ST elevation myocardial infarction (STEMI) involving left anterior descending (LAD) coronary artery with complication ([place])   . Tobacco abuse 07/17/2014    Patient Active Problem List   Diagnosis Date Noted  . Acute on chronic HFrEF (heart failure with reduced ejection fraction) ([place]) 05/05/2019  . HFrEF (heart failure with reduced ejection fraction) ([place]) 05/04/2019  . Acute combined systolic and  diastolic congestive heart failure ([place]) 10/02/2018  . Hepatitis C 04/13/2017  . Current non-adherence to medical treatment 04/13/2017  . Acute on chronic combined systolic (congestive) and diastolic (congestive) heart failure ([place]) 04/13/2017  . HTN (hypertension) 04/12/2017  . Insomnia 01/18/2017  . Chronic combined systolic and diastolic congestive heart failure ([place]) 01/04/2017  . Hyperlipidemia 01/04/2017  . Chest pain 12/30/2016  . CAD S/P percutaneous coronary angioplasty 07/18/2014  . Tobacco abuse 07/17/2014  . Cocaine use 07/17/2014    Past Surgical History:  Procedure Laterality Date  . CORONARY ANGIOPLASTY WITH STENT PLACEMENT  07/18/14   resolute DES to LAD STEMI  . LEFT HEART CATH N/A 07/17/2014   Procedure: LEFT HEART CATH;  Surgeon: [name], MD;  Location: [facility] CATH LAB;  Service: Cardiovascular;  Laterality: N/A;  . PERCUTANEOUS CORONARY STENT INTERVENTION (PCI-S)  07/17/2014   Procedure: PERCUTANEOUS CORONARY STENT INTERVENTION (PCI-S);  Surgeon: [name], MD;  Location: [facility] CATH LAB;  Service: Cardiovascular;;  . RIGHT/LEFT HEART CATH AND CORONARY ANGIOGRAPHY N/A 10/03/2018   Procedure: RIGHT/LEFT HEART CATH AND CORONARY ANGIOGRAPHY;  Surgeon: [name], MD;  Location: [facility] CV LAB;  Service: Cardiovascular;  Laterality: N/A;       Family History  Problem Relation Age of Onset  . Cirrhosis Father   . Heart attack Mother   . Heart attack Sister   . Heart failure Sister   . Heart attack Brother  Social History   Tobacco Use  . Smoking status: Current Every Day Smoker    Packs/day: 0.50    Years: 30.00    Pack years: 15.00    Types: Cigarettes  . Smokeless tobacco: Never Used  . Tobacco comment: .5 PK PER DAY  Substance Use Topics  . Alcohol use: No    Comment: Patient denies abuse, states social drinker  . Drug use: Not Currently    Types: Cocaine    Comment: heroin    Home Medications Prior to Admission medications     Medication Sig Start Date End Date Taking? Authorizing Provider  albuterol (VENTOLIN HFA) 108 (90 Base) MCG/ACT inhaler Inhale 2 puffs into the lungs every 4 (four) hours as needed for wheezing or shortness of breath. 01/24/20   [name], NP  aspirin 81 MG EC tablet Take 1 tablet (81 mg total) by mouth daily. 10/06/18   Argentina Donovan, PA-C  atorvastatin (LIPITOR) 80 MG tablet Take 1 tablet (80 mg total) by mouth daily. 03/28/19   Georgiana Shore, NP  carvedilol (COREG) 6.25 MG tablet Take 1 tablet (6.25 mg total) by mouth 2 (two) times daily with a meal. 01/30/20 04/29/20  [name], MD  digoxin (LANOXIN) 0.125 MG tablet Take 0.5 tablets (0.0625 mg total) by mouth daily. 01/30/20   [name], MD  furosemide (LASIX) 40 MG tablet Take 40 mg by mouth daily.    [provider]  Multiple Vitamin (MULTIVITAMIN) tablet Take 1 tablet by mouth daily.    [provider]  NITROSTAT 0.4 MG SL tablet PLACE 1 TABLET UNDER TONGUE AS NEEDED FOR CHEST PAIN EVERY 5 MINUTES X 3 MAX DOSES.  CALL 911 IF PAIN PERSISTS 02/10/18   [name], MD  omeprazole (PRILOSEC) 20 MG capsule Take 1 capsule (20 mg total) by mouth daily. 01/24/20   [name], NP  sacubitril-valsartan (ENTRESTO) 24-26 MG Take 1 tablet by mouth 2 (two) times daily. 03/28/19   Georgiana Shore, NP  spironolactone (ALDACTONE) 25 MG tablet Take 0.5 tablets (12.5 mg total) by mouth daily. 01/30/20 02/29/20  [name], MD    Allergies    Patient has no known allergies.  Review of Systems   Review of Systems  Respiratory: Positive for shortness of breath.   Cardiovascular: Positive for chest pain.  All other systems reviewed and are negative.   Physical Exam Updated Vital Signs BP 110/81 (BP Location: Right Arm)   Pulse (!) 55   Temp 97.7 F (36.5 C) (Oral)   Resp 16   Ht 5' 10.5" (1.791 m)   Wt 81.2 kg   SpO2 100%   BMI 25.32 kg/m   Physical Exam Vitals and nursing note reviewed.   54  year old male, resting comfortably and in no acute distress. Vital signs are significant for slightly low heart rate. Oxygen saturation is 100%, which is normal. Head is normocephalic and atraumatic. PERRLA, EOMI. Oropharynx is clear. Neck is nontender and supple without adenopathy. JVD is present. Back is nontender and there is no CVA tenderness. Lungs have faint bibasilar rales without wheezes or rhonchi. Chest is nontender. Heart has regular rate and rhythm without murmur. Abdomen is soft, flat, with mild epigastric tenderness.  There is no rebound or guarding.  There are no masses or hepatosplenomegaly and peristalsis is normoactive. Extremities have 1+ edema, full range of motion is present. Skin is warm and dry without rash. Neurologic: Mental status is normal,  cranial nerves are intact, there are no motor or sensory deficits.  ED Results / Procedures / Treatments   Labs (all labs ordered are listed, but only abnormal results are displayed) Labs Reviewed  CBC - Abnormal; Notable for the following components:      Result Value   RDW 19.3 (*)    All other components within normal limits  BASIC METABOLIC PANEL  BRAIN NATRIURETIC PEPTIDE  HEPATIC FUNCTION PANEL  LIPASE, BLOOD  TROPONIN I (HIGH SENSITIVITY)  TROPONIN I (HIGH SENSITIVITY)    EKG EKG Interpretation  Date/Time:  Monday April 01 2020 04:19:34 EDT Ventricular Rate:  112 PR Interval:  176 QRS Duration: 92 QT Interval:  318 QTC Calculation: 434 R Axis:   -65 Text Interpretation: Sinus tachycardia with frequent and consecutive Premature ventricular complexes Biatrial enlargement Left anterior fascicular block Minimal voltage criteria for LVH, may be normal variant ( Cornell product ) Cannot rule out Inferior infarct (masked by fascicular block?) , age undetermined Anterior infarct , age undetermined Abnormal ECG When compared with ECG of 05/04/2019, Premature ventricular complexes are now present Reconfirmed by Delora Fuel ([id number]) on 04/01/2020 6:26:45 AM   Radiology DG Chest Portable 1 View  Result Date: 04/01/2020 CLINICAL DATA:  Chest pain and shortness of breath for 2 days. EXAM: PORTABLE CHEST 1 VIEW COMPARISON:  05/04/2019 FINDINGS: Cardiac enlargement appears stable. Mild central vascular congestion without pulmonary edema. No pleural effusions. No focal infiltrates. No worrisome pulmonary lesions. IMPRESSION: Stable cardiac enlargement and mild central vascular congestion. No edema, infiltrates or effusions. Electronically Signed   By: Marijo Sanes M.D.   On: 04/01/2020 05:26    Procedures Procedures   Medications Ordered in ED Medications  sodium chloride flush (NS) 0.9 % injection 3 mL (3 mLs Intravenous Not Given 04/01/20 0503)  ondansetron (ZOFRAN) injection 4 mg (has no administration in time range)  furosemide (LASIX) injection 40 mg (has no administration in time range)    ED Course  I have reviewed the triage vital signs and the nursing notes.  Pertinent labs & imaging results that were available during my care of the patient were reviewed by me and considered in my medical decision making (see chart for details).  Dyspnea which seems most likely to be CHF exacerbation.  He does have findings on physical exam suggestive of CHF.  Although he does have cough, doubt pneumonia without fever.  Chest x-ray shows no evidence of pneumonia.  He has chronic cardiomegaly and mild pulmonary vascular congestion.  ECG shows no acute changes.  Epigastric pain is most likely GERD.  He will be given a dose of antacids and pantoprazole.  We will also give a dose of intravenous furosemide.  Old records are reviewed showing outpatient management of coronary artery disease and combined systolic and diastolic heart failure, prior admissions for heart failure.  Potassium has come back borderline low at 3.4 and is given oral potassium.  BNP is moderately elevated at 774.  Hepatic function studies show trivial  elevation of ALT.  Case is signed out to Dr. Sherry Ruffing.  MDM Rules/Calculators/A&P  Final Clinical Impression(s) / ED Diagnoses Final diagnoses:  Acute on chronic combined systolic and diastolic heart failure (HCC)  Elevated troponin  Renal insufficiency  Hyperkalemia  Elevated ALT measurement    Rx / DC Orders ED Discharge Orders    None       Delora Fuel, MD [id number] [phone]

## 2020-04-01 NOTE — ED Provider Notes (Signed)
8:44 AM Care assumed from Dr. Roxanne Mins.  At time of transfer of care, patient is awaiting reassessment after work-up is completed.  Patient presented with concern for worsening chest discomfort and shortness of breath for the last week.  Here reports that his symptoms are worse when he lays flat and was nonexertional.  Previous team was more concerned about CHF exacerbation and ordered Lasix for him.  He also does not been taking his omeprazole for reflux.  Initial troponin was 111 and second was slightly down creasing to 104.  Previous team had low suspicion for pneumonia without fever.  Patient was found to have borderline low potassium of 3.4 and was given oral potassium.  If patient is able to go home, the previous provider wanted patient to be on oral potassium supplementation.  We will reassess patient shortly and then may touch base with medicine team about his heart failure management.  10:03 AM Just reassessed the patient and is still complaining of chest tightness and shortness of breath.  He does not think the Lasix he received earlier helped him very much.  I spoke with Dr. Tarri Abernethy with the internal medicine and heart failure team who recommended giving the second dose of 40 IV Lasix and he is going to review the chart and discuss disposition.  Anticipate following up on the recommendations.  Spoke again with the internal medicine team and they feel he does not meet the criteria as he has been positive for cocaine on previous UDS test.  They feel he is safe for discharge home with outpatient follow-up instead.  I went back to talk with the patient but he eloped while waiting for the internal medicine recommendations.  Anticipate patient will follow up with his PCP and return if symptoms change or worsen.  Patient eloped in stable condition.  Clinical Impression: 1. Acute on chronic combined systolic and diastolic heart failure (HCC)   2. Elevated troponin   3. Renal insufficiency   4.  Hyperkalemia   5. Elevated ALT measurement     Disposition: Eloped   Condition: stable   New Prescriptions   No medications on file    Follow Up: No follow-up provider specified.    Jaquane Boughner, Gwenyth Allegra, MD 04/01/20 [phone]

## 2020-04-09 ENCOUNTER — Other Ambulatory Visit: Payer: Self-pay

## 2020-04-09 ENCOUNTER — Emergency Department (HOSPITAL_COMMUNITY): Payer: Medicaid Other

## 2020-04-09 ENCOUNTER — Inpatient Hospital Stay (HOSPITAL_COMMUNITY): Payer: Medicaid Other

## 2020-04-09 ENCOUNTER — Encounter (HOSPITAL_COMMUNITY): Payer: Self-pay

## 2020-04-09 ENCOUNTER — Inpatient Hospital Stay (HOSPITAL_COMMUNITY)
Admission: EM | Admit: 2020-04-09 | Discharge: 2020-04-15 | DRG: 917 | Disposition: A | Discharge status: Home / Self Care | Payer: Medicaid Other | Attending: Internal Medicine | Admitting: Internal Medicine

## 2020-04-09 DIAGNOSIS — I493 Ventricular premature depolarization: Secondary | ICD-10-CM | POA: Diagnosis not present

## 2020-04-09 DIAGNOSIS — R079 Chest pain, unspecified: Secondary | ICD-10-CM | POA: Diagnosis not present

## 2020-04-09 DIAGNOSIS — J449 Chronic obstructive pulmonary disease, unspecified: Secondary | ICD-10-CM | POA: Diagnosis present

## 2020-04-09 DIAGNOSIS — T405X1A Poisoning by cocaine, accidental (unintentional), initial encounter: Principal | ICD-10-CM | POA: Diagnosis present

## 2020-04-09 DIAGNOSIS — E876 Hypokalemia: Secondary | ICD-10-CM | POA: Diagnosis not present

## 2020-04-09 DIAGNOSIS — K761 Chronic passive congestion of liver: Secondary | ICD-10-CM | POA: Diagnosis present

## 2020-04-09 DIAGNOSIS — Z20822 Contact with and (suspected) exposure to covid-19: Secondary | ICD-10-CM | POA: Diagnosis present

## 2020-04-09 DIAGNOSIS — R05 Cough: Secondary | ICD-10-CM

## 2020-04-09 DIAGNOSIS — Z9114 Patient's other noncompliance with medication regimen: Secondary | ICD-10-CM

## 2020-04-09 DIAGNOSIS — N1832 Chronic kidney disease, stage 3b: Secondary | ICD-10-CM | POA: Diagnosis present

## 2020-04-09 DIAGNOSIS — D649 Anemia, unspecified: Secondary | ICD-10-CM | POA: Diagnosis not present

## 2020-04-09 DIAGNOSIS — Z9861 Coronary angioplasty status: Secondary | ICD-10-CM

## 2020-04-09 DIAGNOSIS — Z515 Encounter for palliative care: Secondary | ICD-10-CM

## 2020-04-09 DIAGNOSIS — R570 Cardiogenic shock: Secondary | ICD-10-CM

## 2020-04-09 DIAGNOSIS — E872 Acidosis: Secondary | ICD-10-CM | POA: Diagnosis not present

## 2020-04-09 DIAGNOSIS — I2510 Atherosclerotic heart disease of native coronary artery without angina pectoris: Secondary | ICD-10-CM

## 2020-04-09 DIAGNOSIS — D631 Anemia in chronic kidney disease: Secondary | ICD-10-CM | POA: Diagnosis present

## 2020-04-09 DIAGNOSIS — E875 Hyperkalemia: Secondary | ICD-10-CM | POA: Diagnosis not present

## 2020-04-09 DIAGNOSIS — Z79899 Other long term (current) drug therapy: Secondary | ICD-10-CM

## 2020-04-09 DIAGNOSIS — I5041 Acute combined systolic (congestive) and diastolic (congestive) heart failure: Secondary | ICD-10-CM

## 2020-04-09 DIAGNOSIS — I5043 Acute on chronic combined systolic (congestive) and diastolic (congestive) heart failure: Secondary | ICD-10-CM | POA: Diagnosis present

## 2020-04-09 DIAGNOSIS — F17210 Nicotine dependence, cigarettes, uncomplicated: Secondary | ICD-10-CM | POA: Diagnosis present

## 2020-04-09 DIAGNOSIS — R7989 Other specified abnormal findings of blood chemistry: Secondary | ICD-10-CM

## 2020-04-09 DIAGNOSIS — N179 Acute kidney failure, unspecified: Secondary | ICD-10-CM | POA: Diagnosis present

## 2020-04-09 DIAGNOSIS — T50906A Underdosing of unspecified drugs, medicaments and biological substances, initial encounter: Secondary | ICD-10-CM | POA: Diagnosis present

## 2020-04-09 DIAGNOSIS — R0602 Shortness of breath: Secondary | ICD-10-CM | POA: Diagnosis not present

## 2020-04-09 DIAGNOSIS — I252 Old myocardial infarction: Secondary | ICD-10-CM

## 2020-04-09 DIAGNOSIS — R109 Unspecified abdominal pain: Secondary | ICD-10-CM | POA: Diagnosis not present

## 2020-04-09 DIAGNOSIS — I427 Cardiomyopathy due to drug and external agent: Secondary | ICD-10-CM | POA: Diagnosis present

## 2020-04-09 DIAGNOSIS — E785 Hyperlipidemia, unspecified: Secondary | ICD-10-CM | POA: Diagnosis present

## 2020-04-09 DIAGNOSIS — Z955 Presence of coronary angioplasty implant and graft: Secondary | ICD-10-CM

## 2020-04-09 DIAGNOSIS — Z7982 Long term (current) use of aspirin: Secondary | ICD-10-CM

## 2020-04-09 DIAGNOSIS — I081 Rheumatic disorders of both mitral and tricuspid valves: Secondary | ICD-10-CM | POA: Diagnosis present

## 2020-04-09 DIAGNOSIS — R059 Cough, unspecified: Secondary | ICD-10-CM

## 2020-04-09 DIAGNOSIS — I2781 Cor pulmonale (chronic): Secondary | ICD-10-CM | POA: Diagnosis present

## 2020-04-09 DIAGNOSIS — Z8249 Family history of ischemic heart disease and other diseases of the circulatory system: Secondary | ICD-10-CM

## 2020-04-09 DIAGNOSIS — N183 Chronic kidney disease, stage 3 unspecified: Secondary | ICD-10-CM

## 2020-04-09 DIAGNOSIS — F1490 Cocaine use, unspecified, uncomplicated: Secondary | ICD-10-CM | POA: Diagnosis not present

## 2020-04-09 DIAGNOSIS — I110 Hypertensive heart disease with heart failure: Secondary | ICD-10-CM | POA: Diagnosis not present

## 2020-04-09 DIAGNOSIS — I130 Hypertensive heart and chronic kidney disease with heart failure and stage 1 through stage 4 chronic kidney disease, or unspecified chronic kidney disease: Secondary | ICD-10-CM | POA: Diagnosis present

## 2020-04-09 DIAGNOSIS — Y92009 Unspecified place in unspecified non-institutional (private) residence as the place of occurrence of the external cause: Secondary | ICD-10-CM

## 2020-04-09 DIAGNOSIS — I361 Nonrheumatic tricuspid (valve) insufficiency: Secondary | ICD-10-CM | POA: Diagnosis not present

## 2020-04-09 DIAGNOSIS — I248 Other forms of acute ischemic heart disease: Secondary | ICD-10-CM | POA: Diagnosis present

## 2020-04-09 DIAGNOSIS — F1420 Cocaine dependence, uncomplicated: Secondary | ICD-10-CM | POA: Diagnosis present

## 2020-04-09 DIAGNOSIS — Z91128 Patient's intentional underdosing of medication regimen for other reason: Secondary | ICD-10-CM

## 2020-04-09 DIAGNOSIS — R579 Shock, unspecified: Secondary | ICD-10-CM

## 2020-04-09 LAB — URINALYSIS, ROUTINE W REFLEX MICROSCOPIC
Bilirubin Urine: NEGATIVE
Glucose, UA: NEGATIVE mg/dL
Hgb urine dipstick: NEGATIVE
Ketones, ur: NEGATIVE mg/dL
Leukocytes,Ua: NEGATIVE
Nitrite: NEGATIVE
Protein, ur: 100 mg/dL — AB
Specific Gravity, Urine: 1.018 (ref 1.005–1.030)
pH: 5 (ref 5.0–8.0)

## 2020-04-09 LAB — COMPREHENSIVE METABOLIC PANEL
ALT: 321 U/L — ABNORMAL HIGH (ref 0–44)
AST: 92 U/L — ABNORMAL HIGH (ref 15–41)
Albumin: 4 g/dL (ref 3.5–5.0)
Alkaline Phosphatase: 154 U/L — ABNORMAL HIGH (ref 38–126)
Anion gap: 13 (ref 5–15)
BUN: 41 mg/dL — ABNORMAL HIGH (ref 6–20)
CO2: 26 mmol/L (ref 22–32)
Calcium: 9.8 mg/dL (ref 8.9–10.3)
Chloride: 100 mmol/L (ref 98–111)
Creatinine, Ser: 1.76 mg/dL — ABNORMAL HIGH (ref 0.61–1.24)
GFR calc Af Amer: 50 mL/min — ABNORMAL LOW (ref >60)
GFR calc non Af Amer: 43 mL/min — ABNORMAL LOW (ref >60)
Glucose, Bld: 114 mg/dL — ABNORMAL HIGH (ref 70–99)
Potassium: 4.5 mmol/L (ref 3.5–5.1)
Sodium: 139 mmol/L (ref 135–145)
Total Bilirubin: 2.3 mg/dL — ABNORMAL HIGH (ref 0.3–1.2)
Total Protein: 9 g/dL — ABNORMAL HIGH (ref 6.5–8.1)

## 2020-04-09 LAB — CBC
HCT: 42.3 % (ref 39.0–52.0)
Hemoglobin: 13.4 g/dL (ref 13.0–17.0)
MCH: 27.8 pg (ref 26.0–34.0)
MCHC: 31.7 g/dL (ref 30.0–36.0)
MCV: 87.8 fL (ref 80.0–100.0)
Platelets: 232 10*3/uL (ref 150–400)
RBC: 4.82 MIL/uL (ref 4.22–5.81)
RDW: 19.8 % — ABNORMAL HIGH (ref 11.5–15.5)
WBC: 11.1 10*3/uL — ABNORMAL HIGH (ref 4.0–10.5)
nRBC: 0.3 % — ABNORMAL HIGH (ref 0.0–0.2)

## 2020-04-09 LAB — RETICULOCYTES
Immature Retic Fract: 22.4 % — ABNORMAL HIGH (ref 2.3–15.9)
RBC.: 5.46 MIL/uL (ref 4.22–5.81)
Retic Count, Absolute: 134.9 10*3/uL (ref 19.0–186.0)
Retic Ct Pct: 2.5 % (ref 0.4–3.1)

## 2020-04-09 LAB — VITAMIN B12: Vitamin B-12: 1401 pg/mL — ABNORMAL HIGH (ref 180–914)

## 2020-04-09 LAB — CBC WITH DIFFERENTIAL/PLATELET
Abs Immature Granulocytes: 0.02 10*3/uL (ref 0.00–0.07)
Basophils Absolute: 0 10*3/uL (ref 0.0–0.1)
Basophils Relative: 0 %
Eosinophils Absolute: 0 10*3/uL (ref 0.0–0.5)
Eosinophils Relative: 1 %
HCT: 12.1 % — ABNORMAL LOW (ref 39.0–52.0)
Hemoglobin: 3.6 g/dL — CL (ref 13.0–17.0)
Immature Granulocytes: 1 %
Lymphocytes Relative: 10 %
Lymphs Abs: 0.3 10*3/uL — ABNORMAL LOW (ref 0.7–4.0)
MCH: 27.1 pg (ref 26.0–34.0)
MCHC: 29.8 g/dL — ABNORMAL LOW (ref 30.0–36.0)
MCV: 91 fL (ref 80.0–100.0)
Monocytes Absolute: 0.3 10*3/uL (ref 0.1–1.0)
Monocytes Relative: 9 %
Neutro Abs: 2.3 10*3/uL (ref 1.7–7.7)
Neutrophils Relative %: 79 %
Platelets: 253 10*3/uL (ref 150–400)
RBC: 1.33 MIL/uL — ABNORMAL LOW (ref 4.22–5.81)
RDW: 19.8 % — ABNORMAL HIGH (ref 11.5–15.5)
WBC: 2.9 10*3/uL — ABNORMAL LOW (ref 4.0–10.5)
nRBC: 0 % (ref 0.0–0.2)

## 2020-04-09 LAB — LIPASE, BLOOD: Lipase: 178 U/L — ABNORMAL HIGH (ref 11–51)

## 2020-04-09 LAB — FERRITIN: Ferritin: 47 ng/mL (ref 24–336)

## 2020-04-09 LAB — RAPID URINE DRUG SCREEN, HOSP PERFORMED
Amphetamines: NOT DETECTED
Barbiturates: NOT DETECTED
Benzodiazepines: NOT DETECTED
Cocaine: POSITIVE — AB
Opiates: NOT DETECTED
Tetrahydrocannabinol: NOT DETECTED

## 2020-04-09 LAB — IRON AND TIBC
Iron: 49 ug/dL (ref 45–182)
Saturation Ratios: 13 % — ABNORMAL LOW (ref 17.9–39.5)
TIBC: 387 ug/dL (ref 250–450)
UIBC: 338 ug/dL

## 2020-04-09 LAB — FOLATE: Folate: 12.6 ng/mL (ref >5.9)

## 2020-04-09 LAB — PREPARE RBC (CROSSMATCH)

## 2020-04-09 LAB — HEMOGLOBIN AND HEMATOCRIT, BLOOD
HCT: 46.1 % (ref 39.0–52.0)
Hemoglobin: 15.6 g/dL (ref 13.0–17.0)

## 2020-04-09 LAB — BRAIN NATRIURETIC PEPTIDE: B Natriuretic Peptide: 1195.9 pg/mL — ABNORMAL HIGH (ref 0.0–100.0)

## 2020-04-09 LAB — ABO/RH: ABO/RH(D): B POS

## 2020-04-09 LAB — AMYLASE: Amylase: 152 U/L — ABNORMAL HIGH (ref 28–100)

## 2020-04-09 LAB — LACTIC ACID, PLASMA: Lactic Acid, Venous: 4 mmol/L (ref 0.5–1.9)

## 2020-04-09 LAB — TROPONIN I (HIGH SENSITIVITY): Troponin I (High Sensitivity): 173 ng/L (ref <18)

## 2020-04-09 MED ORDER — SODIUM CHLORIDE (PF) 0.9 % IJ SOLN
INTRAMUSCULAR | Status: AC
  Filled 2020-04-09: qty 50

## 2020-04-09 MED ORDER — OCTREOTIDE LOAD VIA INFUSION
50.0000 ug | Freq: Once | INTRAVENOUS | Status: DC
  Filled 2020-04-09: qty 25

## 2020-04-09 MED ORDER — IOHEXOL 350 MG/ML SOLN
100.0000 mL | Freq: Once | INTRAVENOUS | Status: AC | PRN
  Administered 2020-04-09: 100 mL via INTRAVENOUS

## 2020-04-09 MED ORDER — SODIUM CHLORIDE 0.9 % IV SOLN
10.0000 mL/h | Freq: Once | INTRAVENOUS | Status: AC
  Administered 2020-04-09: 15:00:00 10 mL/h via INTRAVENOUS

## 2020-04-09 MED ORDER — SODIUM CHLORIDE 0.9 % IV SOLN
50.0000 ug/h | INTRAVENOUS | Status: DC
  Filled 2020-04-09: qty 1

## 2020-04-09 MED ORDER — FUROSEMIDE 10 MG/ML IJ SOLN
40.0000 mg | Freq: Once | INTRAMUSCULAR | Status: AC
  Administered 2020-04-09: 40 mg via INTRAVENOUS
  Filled 2020-04-09: qty 4

## 2020-04-09 MED ORDER — SODIUM CHLORIDE 0.9 % IV SOLN
80.0000 mg | Freq: Once | INTRAVENOUS | Status: AC
  Administered 2020-04-09: 80 mg via INTRAVENOUS
  Filled 2020-04-09: qty 80

## 2020-04-09 MED ORDER — SODIUM CHLORIDE 0.9% FLUSH: 3.0000 mL | Freq: Once | INTRAVENOUS | Status: DC

## 2020-04-09 MED ORDER — PANTOPRAZOLE SODIUM 40 MG IV SOLR
40.0000 mg | Freq: Two times a day (BID) | INTRAVENOUS | Status: DC
  Administered 2020-04-09 – 2020-04-10 (×2): 40 mg via INTRAVENOUS
  Filled 2020-04-09 (×2): qty 40

## 2020-04-09 MED ORDER — SODIUM CHLORIDE 0.9 % IV BOLUS
1000.0000 mL | Freq: Once | INTRAVENOUS | Status: AC
  Administered 2020-04-09: 1000 mL via INTRAVENOUS

## 2020-04-09 NOTE — ED Provider Notes (Addendum)
[facility] DEPT Provider Note   CSN: [id number] Arrival date & time: 04/09/20  [id number]     History Chief Complaint  Patient presents with  . Abdominal Pain    Tyler Wong is a 54 y.o. male.  HPI    54 year old male comes in a chief complaint of abdominal pain and shortness of breath.  He has history of coronary artery disease, CHF, cocaine use, COPD.  He reports that over the last 2 days he has been having abdominal pain.  Abdominal pain is generalized and he noted that there was associated distention of his abdomen.  He went to [facility] emergency room earlier today and left without being seen.  He came to the ER because he wants to ensure there is nothing wrong, however his abdominal distention has come down and he no longer has severe abdominal pain.  Patient also complains of worsening swelling in his legs.  He has history of CHF and he has been taking his medications as prescribed.  Review of system is also positive for orthopnea and paroxysmal nocturnal dyspnea.  Patient denies any chest pain.  He denies any cocaine use, history does indicate that he has used cocaine in the past.  Past Medical History:  Diagnosis Date  . CAD in native artery 07/17/14   STEMI- LAD stenosis with DES  . CHF (congestive heart failure) ([place])   . Cocaine abuse ([place])   . COPD (chronic obstructive pulmonary disease) ([place])   . Dyspnea   . Hypertension   . ST elevation myocardial infarction (STEMI) involving left anterior descending (LAD) coronary artery with complication ([place])   . Tobacco abuse 07/17/2014    Patient Active Problem List   Diagnosis Date Noted  . Acute on chronic HFrEF (heart failure with reduced ejection fraction) ([place]) 05/05/2019  . HFrEF (heart failure with reduced ejection fraction) ([place]) 05/04/2019  . Acute combined systolic and diastolic congestive heart failure ([place]) 10/02/2018  . Hepatitis C 04/13/2017  . Current non-adherence to medical  treatment 04/13/2017  . Acute on chronic combined systolic (congestive) and diastolic (congestive) heart failure ([place]) 04/13/2017  . HTN (hypertension) 04/12/2017  . Insomnia 01/18/2017  . Chronic combined systolic and diastolic congestive heart failure ([place]) 01/04/2017  . Hyperlipidemia 01/04/2017  . Chest pain 12/30/2016  . CAD S/P percutaneous coronary angioplasty 07/18/2014  . Tobacco abuse 07/17/2014  . Cocaine use 07/17/2014    Past Surgical History:  Procedure Laterality Date  . CORONARY ANGIOPLASTY WITH STENT PLACEMENT  07/18/14   resolute DES to LAD STEMI  . LEFT HEART CATH N/A 07/17/2014   Procedure: LEFT HEART CATH;  Surgeon: [name], MD;  Location: [facility] CATH LAB;  Service: Cardiovascular;  Laterality: N/A;  . PERCUTANEOUS CORONARY STENT INTERVENTION (PCI-S)  07/17/2014   Procedure: PERCUTANEOUS CORONARY STENT INTERVENTION (PCI-S);  Surgeon: [name], MD;  Location: [facility] CATH LAB;  Service: Cardiovascular;;  . RIGHT/LEFT HEART CATH AND CORONARY ANGIOGRAPHY N/A 10/03/2018   Procedure: RIGHT/LEFT HEART CATH AND CORONARY ANGIOGRAPHY;  Surgeon: [name], MD;  Location: [facility] CV LAB;  Service: Cardiovascular;  Laterality: N/A;       Family History  Problem Relation Age of Onset  . Cirrhosis Father   . Heart attack Mother   . Heart attack Sister   . Heart failure Sister   . Heart attack Brother     Social History   Tobacco Use  . Smoking status: Current Every Day Smoker    Packs/day:  0.50    Years: 30.00    Pack years: 15.00    Types: Cigarettes  . Smokeless tobacco: Never Used  . Tobacco comment: .5 PK PER DAY  Substance Use Topics  . Alcohol use: No    Comment: Patient denies abuse, states social drinker  . Drug use: Not Currently    Types: Cocaine    Comment: heroin    Home Medications Prior to Admission medications   Medication Sig Start Date End Date Taking? Authorizing Provider  albuterol (VENTOLIN HFA) 108 (90 Base) MCG/ACT  inhaler Inhale 2 puffs into the lungs every 4 (four) hours as needed for wheezing or shortness of breath. 01/24/20   [name], NP  aspirin 81 MG EC tablet Take 1 tablet (81 mg total) by mouth daily. 10/06/18   Argentina Donovan, PA-C  atorvastatin (LIPITOR) 80 MG tablet Take 1 tablet (80 mg total) by mouth daily. 03/28/19   Georgiana Shore, NP  carvedilol (COREG) 6.25 MG tablet Take 1 tablet (6.25 mg total) by mouth 2 (two) times daily with a meal. 01/30/20 04/29/20  [name], MD  digoxin (LANOXIN) 0.125 MG tablet Take 0.5 tablets (0.0625 mg total) by mouth daily. 01/30/20   [name], MD  docusate sodium (COLACE) 100 MG capsule Take 100 mg by mouth daily as needed for mild constipation.    [provider]  furosemide (LASIX) 40 MG tablet Take 40 mg by mouth daily.    [provider]  Multiple Vitamin (MULTIVITAMIN) tablet Take 1 tablet by mouth daily.    [provider]  NITROSTAT 0.4 MG SL tablet PLACE 1 TABLET UNDER TONGUE AS NEEDED FOR CHEST PAIN EVERY 5 MINUTES X 3 MAX DOSES.  CALL 911 IF PAIN PERSISTS Patient taking differently: Place 0.4 mg under the tongue every 5 (five) minutes as needed for chest pain.  02/10/18   [name], MD  omeprazole (PRILOSEC) 20 MG capsule Take 1 capsule (20 mg total) by mouth daily. Patient taking differently: Take 20 mg by mouth daily as needed (reflux/heartburn).  01/24/20   [name], NP  sacubitril-valsartan (ENTRESTO) 24-26 MG Take 1 tablet by mouth 2 (two) times daily. 03/28/19   Georgiana Shore, NP  spironolactone (ALDACTONE) 25 MG tablet Take 0.5 tablets (12.5 mg total) by mouth daily. 01/30/20 04/01/20  [name], MD    Allergies    Patient has no known allergies.  Review of Systems   Review of Systems  Constitutional: Positive for activity change.  Respiratory: Positive for shortness of breath.   Cardiovascular: Negative for chest pain.  Gastrointestinal: Positive for abdominal pain.  Negative for nausea and vomiting.  All other systems reviewed and are negative.   Physical Exam Updated Vital Signs BP (!) 116/38   Pulse 89   Temp 98.1 F (36.7 C) (Oral)   Resp (!) 22   Ht 5\' 10"  (1.778 m)   Wt 78 kg   SpO2 98%   BMI 24.68 kg/m   Physical Exam Vitals and nursing note reviewed.  Constitutional:      Appearance: He is well-developed.  HENT:     Head: Atraumatic.  Cardiovascular:     Rate and Rhythm: Normal rate.  Pulmonary:     Effort: Pulmonary effort is normal.  Musculoskeletal:     Cervical back: Neck supple.  Skin:    General: Skin is warm.  Neurological:     Mental Status: He is alert and oriented to person,  place, and time.     ED Results / Procedures / Treatments   Labs (all labs ordered are listed, but only abnormal results are displayed) Labs Reviewed  URINALYSIS, ROUTINE W REFLEX MICROSCOPIC - Abnormal; Notable for the following components:      Result Value   APPearance HAZY (*)    Protein, ur 100 (*)    Bacteria, UA RARE (*)    All other components within normal limits  CBC WITH DIFFERENTIAL/PLATELET - Abnormal; Notable for the following components:   WBC 2.9 (*)    RBC 1.33 (*)    Hemoglobin 3.6 (*)    HCT 12.1 (*)    MCHC 29.8 (*)    RDW 19.8 (*)    Lymphs Abs 0.3 (*)    All other components within normal limits  BRAIN NATRIURETIC PEPTIDE - Abnormal; Notable for the following components:   B Natriuretic Peptide 1,195.9 (*)    All other components within normal limits  COMPREHENSIVE METABOLIC PANEL - Abnormal; Notable for the following components:   Glucose, Bld 114 (*)    BUN 41 (*)    Creatinine, Ser 1.76 (*)    Total Protein 9.0 (*)    AST 92 (*)    ALT 321 (*)    Alkaline Phosphatase 154 (*)    Total Bilirubin 2.3 (*)    GFR calc non Af Amer 43 (*)    GFR calc Af Amer 50 (*)    All other components within normal limits  LIPASE, BLOOD - Abnormal; Notable for the following components:   Lipase 178 (*)    All other  components within normal limits  RAPID URINE DRUG SCREEN, HOSP PERFORMED - Abnormal; Notable for the following components:   Cocaine POSITIVE (*)    All other components within normal limits  VITAMIN B12 - Abnormal; Notable for the following components:   Vitamin B-12 1,401 (*)    All other components within normal limits  IRON AND TIBC - Abnormal; Notable for the following components:   Saturation Ratios 13 (*)    All other components within normal limits  RETICULOCYTES - Abnormal; Notable for the following components:   Immature Retic Fract 22.4 (*)    All other components within normal limits  FOLATE  FERRITIN  POC OCCULT BLOOD, ED  TYPE AND SCREEN  PREPARE RBC (CROSSMATCH)  ABO/RH    EKG EKG Interpretation  Date/Time:  Tuesday April 09 2020 06:08:31 EDT Ventricular Rate:  113 PR Interval:    QRS Duration: 103 QT Interval:  364 QTC Calculation: 500 R Axis:   -86 Text Interpretation: Sinus tachycardia Multiple ventricular premature complexes Right atrial enlargement Inferior infarct, old Abnormal lateral Q waves Anterior infarct, old Reconfirmed by Varney Biles [phone]) on 04/09/2020 12:57:10 PM   Radiology DG Chest Port 1 View  Result Date: 04/09/2020 CLINICAL DATA:  Shortness of breath. EXAM: PORTABLE CHEST 1 VIEW COMPARISON:  04/01/2020 FINDINGS: Lungs are adequately inflated without focal airspace consolidation or effusion. Minimal stable scarring right upper lung. Stable cardiomegaly. Remainder the exam is unchanged. IMPRESSION: No acute cardiopulmonary disease. Stable cardiomegaly. Electronically Signed   By: Marin Olp M.D.   On: 04/09/2020 12:54   CT Angio Chest/Abd/Pel for Dissection W and/or Wo Contrast  Result Date: 04/09/2020 CLINICAL DATA:  Chest pain, shortness of breath, severe anemia and abdominal pain. History of cocaine abuse. Evaluate for possible dissection or retroperitoneal bleed. EXAM: CT ANGIOGRAPHY CHEST, ABDOMEN AND PELVIS TECHNIQUE:  Multidetector CT imaging through the chest, abdomen  and pelvis was performed using the standard protocol during bolus administration of intravenous contrast. Multiplanar reconstructed images and MIPs were obtained and reviewed to evaluate the vascular anatomy. CONTRAST:  159mL OMNIPAQUE IOHEXOL 350 MG/ML SOLN COMPARISON:  CT abdomen/pelvis 10/23/2017 and CT angio chest, FINDINGS: CTA CHEST FINDINGS Cardiovascular: Mild to moderate cardiac enlargement. Four-chamber enlargement but most notably the left ventricle. The aorta is normal in caliber. No dissection. The aortic branch vessels are patent. Minimal scattered atherosclerotic calcifications at the aortic arch. Fairly extensive/age advanced three-vessel coronary artery calcifications. Moderate reflux of contrast down the IVC and into dilated hepatic veins suggesting tricuspid regurgitation or some component of right heart failure. Mediastinum/Nodes: No mediastinal or hilar mass or lymphadenopathy. The esophagus is grossly normal. Lungs/Pleura: Stable mild emphysematous changes and areas of pulmonary scarring. There are also some areas of mild bronchiectasis. I do not see any worrisome pulmonary lesions or pulmonary nodules. There is a small right-sided pleural effusion with minimal overlying atelectasis. Musculoskeletal: No significant bony findings. Review of the MIP images confirms the above findings. CTA ABDOMEN AND PELVIS FINDINGS VASCULAR Aorta: Normal Celiac: Normal SMA: Normal Renals: Normal IMA: Normal Inflow: Normal Veins: Normal Review of the MIP images confirms the above findings. NON-VASCULAR Hepatobiliary: Severe hepatic congestion findings with reflux of contrast down the IVC, into the dilated hepatic veins and into the adjacent lung parenchyma likely due to heart failure and or tricuspid regurgitation. Small cluster of calcifications in the right hepatic lobe. No worrisome hepatic lesions. No biliary dilatation. The gallbladder is grossly normal. No  common bile duct dilatation. Pancreas: No mass, inflammation or ductal dilatation. Spleen: Normal size. No focal lesions. Adrenals/Urinary Tract: The adrenal glands and kidneys are grossly normal. The bladder is unremarkable. Stomach/Bowel: The stomach, duodenum, small bowel and colon are grossly normal without oral contrast. No acute inflammatory changes, mass lesions or obstructive findings. The terminal ileum and appendix are normal. Lymphatic: No mesenteric or retroperitoneal mass, adenopathy or hematoma. Reproductive: The prostate gland and seminal vesicles are unremarkable. Other: Mild mesenteric edema with a small amount of free abdominal/pelvic fluid. There is also body wall edema. Findings suggest anasarca are related to chronic CHF. Musculoskeletal: No significant bony findings. Review of the MIP images confirms the above findings. IMPRESSION: 1. No CT findings for aortic dissection or aneurysm. 2. Four-chamber enlargement but most notably the left ventricle. 3. Age advanced three-vessel coronary artery calcifications. 4. Reflux of contrast down the IVC and into the dilated hepatic veins and into the adjacent hepatic parenchyma (hepatic congestion) likely due to heart failure and or tricuspid regurgitation. 5. Small right-sided pleural effusion and minimal overlying atelectasis. 6. Mild emphysematous changes and pulmonary scarring. 7. No acute abdominal/pelvic findings, mass lesions or adenopathy. 8. Emphysema and aortic atherosclerosis. Aortic Atherosclerosis (ICD10-I70.0) and Emphysema (ICD10-J43.9). Electronically Signed   By: Marijo Sanes M.D.   On: 04/09/2020 15:28    Procedures .Critical Care Performed by: Varney Biles, MD Authorized by: Varney Biles, MD   Critical care provider statement:    Critical care time (minutes):  101   Critical care was necessary to treat or prevent imminent or life-threatening deterioration of the following conditions:  Cardiac failure   Critical care was  time spent personally by me on the following activities:  Discussions with consultants, evaluation of patient's response to treatment, examination of patient, ordering and performing treatments and interventions, ordering and review of laboratory studies, ordering and review of radiographic studies, pulse oximetry, re-evaluation of patient's condition, obtaining history from patient or  surrogate and review of old charts   (including critical care time)  Medications Ordered in ED Medications  sodium chloride flush (NS) 0.9 % injection 3 mL (3 mLs Intravenous Not Given 04/09/20 1229)  sodium chloride (PF) 0.9 % injection (has no administration in time range)  pantoprazole (PROTONIX) 80 mg in sodium chloride 0.9 % 100 mL IVPB (0 mg Intravenous Stopped 04/09/20 1447)  0.9 %  sodium chloride infusion (10 mL/hr Intravenous New Bag/Given 04/09/20 1527)  iohexol (OMNIPAQUE) 350 MG/ML injection 100 mL (100 mLs Intravenous Contrast Given 04/09/20 1435)    ED Course  I have reviewed the triage vital signs and the nursing notes.  Pertinent labs & imaging results that were available during my care of the patient were reviewed by me and considered in my medical decision making (see chart for details).    MDM Rules/Calculators/A&P                       54 year old comes in a chief complaint of abdominal pain.  He also complains of shortness of breath.  He has history of CHF with a EF of about 25%, CAD, COPD and prior history of cocaine use.  He also appears to have hepatitis history.  On exam he is noted to have epigastric and right upper quadrant tenderness.  He denies any chest pain.  Lungs are clear to auscultation with pitting edema over the lower extremities.  Clinically it appears that he is having CHF exacerbation causing shortness of breath.  Abdominal pain is slightly unclear.  Epigastric pain due to gastritis, pancreatitis in the differential.  Cholelithiasis also considered.  Additionally because he  is using cocaine, cocaine related complications are also possible.  For now we will start with getting basic labs and reassess the patient.   Final Clinical Impression(s) / ED Diagnoses Final diagnoses:  Symptomatic anemia  Acute combined systolic and diastolic congestive heart failure (HCC)  Hepatic congestion    Rx / DC Orders ED Discharge Orders    None       Varney Biles, MD 04/09/20 [place], Whittany [place], MD 04/09/20 1544

## 2020-04-09 NOTE — H&P (Signed)
NAME:  Tyler Wong, MRN:  [id number], DOB:  April 02, 1966, LOS: 0 ADMISSION DATE:  04/09/2020, CONSULTATION DATE:  04/09/20 REFERRING MD:  Dollene Cleveland MD, CHIEF COMPLAINT: Symptomatic anemia, CHF  Brief History   54 year old with history of coronary artery disease, CHF [EF 25%], cocaine use, COPD Admitted with shortness of breath, abdominal pain.  Found to have symptomatic anemia with hemoglobin of 3.6.  PCCM consulted for admission  Patient was recently evaluated in the ED on 4/5 with hemoglobin of 13.3.  Denies any melena, dark stools, hematemesis Hemoccult ED is reportedly negative Currently getting 1 unit PRBC. He is hemodynamically stable.  Asking for something to eat.`  Past Medical History    has a past medical history of CAD in native artery (07/17/14), CHF (congestive heart failure) ([place]), Cocaine abuse ([place]), COPD (chronic obstructive pulmonary disease) ([place]), Dyspnea, Hypertension, ST elevation myocardial infarction (STEMI) involving left anterior descending (LAD) coronary artery with complication ([place]), and Tobacco abuse (07/17/2014).  Significant Hospital Events   4/13- admit  Consults:    Procedures:  CTA chest abdomen pelvis 04/09/2020-cardiac enlargement, coronary artery calcification, mild emphysema with pulmonary scarring.  No acute abdominal process.  Significant Diagnostic Tests:    Micro Data:    Antimicrobials:    Interim history/subjective:    Objective   Blood pressure 119/72, pulse 80, temperature 98.1 F (36.7 C), temperature source Oral, resp. rate 18, height 5\' 10"  (1.778 m), weight 78 kg, SpO2 98 %.        Intake/Output Summary (Last 24 hours) at 04/09/2020 1705 Last data filed at 04/09/2020 1516 Gross per 24 hour  Intake 300 ml  Output --  Net 300 ml   Filed Weights   04/09/20 0609 04/09/20 1251  Weight: 79.8 kg 78 kg    Examination: Gen:      No acute distress HEENT:  EOMI, sclera anicteric Neck:     No masses; no thyromegaly Lungs:     Clear to auscultation bilaterally; normal respiratory effort CV:         Regular rate and rhythm; no murmurs Abd:      + bowel sounds; soft, non-tender; no palpable masses, no distension Ext:    Trace edema; adequate peripheral perfusion Skin:      Warm and dry; no rash Neuro: alert and oriented x 3 Psych: normal mood and affect  Resolved Hospital Problem list     Assessment & Plan:  Severe anemia, unknown type Not sure why his hemoglobin is still low.  He has had normal hemoglobin 10 days ago with no obvious source of bleed, Hemoccult, iron studies are negative.  Wonder if it is a lab error Is currently getting 1 unit PRBC.  We will repeat a CBC after this to reassess Transfuse as needed to keep hemoglobin greater then 8 at least given his history of CHF, coronary artery disease IV PPI twice daily No emergent need for EGD until he is resuscitated and CBC reassessed.  We will hold off on GI consult for now  Acute on chronic systolic and diastolic heart failure Lasix 40 IV after first unit of blood Telemetry monitoring, check troponins Hold home aspirin, Lipitor and Coreg, Entresto and diuretics for now  Cocaine, tobacco abuse We will need cessation counseling   Best practice:  Diet: NPO Pain/Anxiety/Delirium protocol (if indicated): NA VAP protocol (if indicated): NA DVT prophylaxis: SCDs GI prophylaxis: PPI Glucose control: Monitor Mobility: Bed Code Status: Full Family Communication: patient updated Disposition: Full  Labs  CBC: Recent Labs  Lab 04/09/20 1209  WBC 2.9*  NEUTROABS 2.3  HGB 3.6*  HCT 12.1*  MCV 91.0  PLT [id number]    Basic Metabolic Panel: Recent Labs  Lab 04/09/20 1342  NA 139  K 4.5  CL 100  CO2 26  GLUCOSE 114*  BUN 41*  CREATININE 1.76*  CALCIUM 9.8   GFR: Estimated Creatinine Clearance: 49.5 mL/min (A) (by C-G formula based on SCr of 1.76 mg/dL (H)). Recent Labs  Lab 04/09/20 1209  WBC 2.9*    Liver Function Tests: Recent  Labs  Lab 04/09/20 1342  AST 92*  ALT 321*  ALKPHOS 154*  BILITOT 2.3*  PROT 9.0*  ALBUMIN 4.0   Recent Labs  Lab 04/09/20 1342  LIPASE 178*   No results for input(s): AMMONIA in the last 168 hours.  ABG    Component Value Date/Time   HCO3 29.7 (H) 10/03/2018 1419   HCO3 30.1 (H) 10/03/2018 1419   TCO2 31 10/03/2018 1419   TCO2 31 10/03/2018 1419   O2SAT 63.0 10/03/2018 1419   O2SAT 67.0 10/03/2018 1419     Coagulation Profile: No results for input(s): INR, PROTIME in the last 168 hours.  Cardiac Enzymes: No results for input(s): CKTOTAL, CKMB, CKMBINDEX, TROPONINI in the last 168 hours.  HbA1C: Hemoglobin A1C  Date/Time Value Ref Range Status  01/24/2020 11:21 AM 6.5 (A) 4.0 - 5.6 % Final   Hgb A1c MFr Bld  Date/Time Value Ref Range Status  07/18/2014 02:55 AM 5.8 (H) <5.7 % Final    Comment:    (NOTE)                                                                       According to the ADA Clinical Practice Recommendations for 2011, when HbA1c is used as a screening test:  >=6.5%   Diagnostic of Diabetes Mellitus           (if abnormal result is confirmed) 5.7-6.4%   Increased risk of developing Diabetes Mellitus References:Diagnosis and Classification of Diabetes Mellitus,Diabetes [id number] 1):S62-S69 and Standards of Medical Care in         Diabetes - 2011,Diabetes [id number] (Suppl 1):S11-S61.    CBG: No results for input(s): GLUCAP in the last 168 hours.  Review of Systems:   REVIEW OF SYSTEMS:   All negative; except for those that are bolded, which indicate positives.  Constitutional: weight loss, weight gain, night sweats, fevers, chills, fatigue, weakness.  HEENT: headaches, sore throat, sneezing, nasal congestion, post nasal drip, difficulty swallowing, tooth/dental problems, visual complaints, visual changes, ear aches. Neuro: difficulty with speech, weakness, numbness, ataxia. CV:  chest pain, orthopnea, PND, swelling in lower  extremities, dizziness, palpitations, syncope.  Resp: cough, hemoptysis, dyspnea, wheezing. GI: heartburn, indigestion, abdominal pain, nausea, vomiting, diarrhea, constipation, change in bowel habits, loss of appetite, hematemesis, melena, hematochezia.  GU: dysuria, change in color of urine, urgency or frequency, flank pain, hematuria. MSK: joint pain or swelling, decreased range of motion. Psych: change in mood or affect, depression, anxiety, suicidal ideations, homicidal ideations. Skin: rash, itching, bruising.  Past Medical History  He,  has a past medical history of CAD in native artery (07/17/14), CHF (congestive heart failure) ([place]), Cocaine abuse ([place]), COPD (  chronic obstructive pulmonary disease) ([place]), Dyspnea, Hypertension, ST elevation myocardial infarction (STEMI) involving left anterior descending (LAD) coronary artery with complication ([place]), and Tobacco abuse (07/17/2014).   Surgical History    Past Surgical History:  Procedure Laterality Date  . CORONARY ANGIOPLASTY WITH STENT PLACEMENT  07/18/14   resolute DES to LAD STEMI  . LEFT HEART CATH N/A 07/17/2014   Procedure: LEFT HEART CATH;  Surgeon: [name], MD;  Location: [facility] CATH LAB;  Service: Cardiovascular;  Laterality: N/A;  . PERCUTANEOUS CORONARY STENT INTERVENTION (PCI-S)  07/17/2014   Procedure: PERCUTANEOUS CORONARY STENT INTERVENTION (PCI-S);  Surgeon: [name], MD;  Location[facility] CATH LAB;  Service: Cardiovascular;;  . RIGHT/LEFT HEART CATH AND CORONARY ANGIOGRAPHY N/A 10/03/2018   Procedure: RIGHT/LEFT HEART CATH AND CORONARY ANGIOGRAPHY;  Surgeon: [name], MD;  Location: [facility] CV LAB;  Service: Cardiovascular;  Laterality: N/A;     Social History   reports that he has been smoking cigarettes. He has a 15.00 pack-year smoking history. He has never used smokeless tobacco. He reports previous drug use. Drug: Cocaine. He reports that he does not drink alcohol.   Family History   His family  history includes Cirrhosis in his father; Heart attack in his brother, mother, and sister; Heart failure in his sister.   Allergies No Known Allergies   Home Medications  Prior to Admission medications   Medication Sig Start Date End Date Taking? Authorizing Provider  albuterol (VENTOLIN HFA) 108 (90 Base) MCG/ACT inhaler Inhale 2 puffs into the lungs every 4 (four) hours as needed for wheezing or shortness of breath. 01/24/20   [name], NP  aspirin 81 MG EC tablet Take 1 tablet (81 mg total) by mouth daily. 10/06/18   Argentina Donovan, PA-C  atorvastatin (LIPITOR) 80 MG tablet Take 1 tablet (80 mg total) by mouth daily. 03/28/19   Georgiana Shore, NP  carvedilol (COREG) 6.25 MG tablet Take 1 tablet (6.25 mg total) by mouth 2 (two) times daily with a meal. 01/30/20 04/29/20  [name], MD  digoxin (LANOXIN) 0.125 MG tablet Take 0.5 tablets (0.0625 mg total) by mouth daily. 01/30/20   [name], MD  docusate sodium (COLACE) 100 MG capsule Take 100 mg by mouth daily as needed for mild constipation.    [provider]  furosemide (LASIX) 40 MG tablet Take 40 mg by mouth daily.    [provider]  Multiple Vitamin (MULTIVITAMIN) tablet Take 1 tablet by mouth daily.    [provider]  NITROSTAT 0.4 MG SL tablet PLACE 1 TABLET UNDER TONGUE AS NEEDED FOR CHEST PAIN EVERY 5 MINUTES X 3 MAX DOSES.  CALL 911 IF PAIN PERSISTS Patient taking differently: Place 0.4 mg under the tongue every 5 (five) minutes as needed for chest pain.  02/10/18   [name], MD  omeprazole (PRILOSEC) 20 MG capsule Take 1 capsule (20 mg total) by mouth daily. Patient taking differently: Take 20 mg by mouth daily as needed (reflux/heartburn).  01/24/20   [name], NP  sacubitril-valsartan (ENTRESTO) 24-26 MG Take 1 tablet by mouth 2 (two) times daily. 03/28/19   Georgiana Shore, NP  spironolactone (ALDACTONE) 25 MG tablet Take 0.5 tablets (12.5 mg total) by mouth daily.  01/30/20 04/01/20  [name], MD     Critical care time:     The patient is critically ill with multiple organ system failure and requires high complexity decision making for  assessment and support, frequent evaluation and titration of therapies, advanced monitoring, review of radiographic studies and interpretation of complex data.   Critical Care Time devoted to patient care services, exclusive of separately billable procedures, described in this note is 45 minutes.   Marshell Garfinkel MD [facility] Please see [email or web address] for pager details.  04/09/2020, 5:21 PM

## 2020-04-09 NOTE — ED Notes (Signed)
IV team at bedside 

## 2020-04-09 NOTE — Progress Notes (Signed)
[facility] Progress Note Patient Name: Kinney Guereca DOB: 11-13-1966 MRN: [id number]   Date of Service  04/09/2020  HPI/Events of Note  Multiple issues: 1. Lactic Acid = 4.0. Recent CXR negative and 2. Troponin = 173. Demand ischemia? Repeat Troponin pending.   eICU Interventions  Will order: 1. Bolus with 0.9 NaCl 1 liter over 1 hour now.  2. 12 Lead EKG STAT 3. Continue to trend Troponin.      Intervention Category Major Interventions: Other:;Acid-Base disturbance - evaluation and management  Lysle Dingwall 04/09/2020, 10:35 PM

## 2020-04-09 NOTE — ED Notes (Signed)
Attempted to call report- per ICU, rapid response pt is occupying room 1230 and Mr Stavig cannot be placed there. Informed bed placement.

## 2020-04-09 NOTE — ED Triage Notes (Signed)
Patient arrived with complaints of abdominal pressure that feels like "bloating" that started two days ago. Reports one episode of vomiting.

## 2020-04-09 NOTE — ED Notes (Signed)
Per Dr. Kathrynn Humble, increase rate of blood tranfusion. Rate changed from 120 mL/hr to 150 mL/hr.

## 2020-04-09 NOTE — ED Notes (Signed)
1510 Blood bank called to say blood is ready.

## 2020-04-09 NOTE — ED Notes (Signed)
Date and time results received: 04/09/20 6:34 PM  (use smartphrase ".now" to insert current time)  Test: Trop Critical Value: 173  Name of Provider Notified: Mannam  Orders Received? Or Actions Taken?: Orders Received - See Orders for details

## 2020-04-09 NOTE — ED Notes (Signed)
Pt asked numerous times to stay in bed, pt hbg 3.6 and pt has SOB with exertion. Pt educated about patient safety.

## 2020-04-09 NOTE — Progress Notes (Signed)
1945: RN noticed Hgb 13.4 after previous Hgb 3.6. 2nd unit of RBC infusing. E-Link notified. Pt c/o difficulty breathing. Ordered to stop RBC infusion. Stat H&H ordered. Will continue to monitor.  2230: E-Link notified of critical Lactic Acid 4.0. RN also pointed out Hgb is now 15.6. and Troponins elevated from previous lab draw. Stat EKG ordered and 1L fluid bolus. E-link notified that EKG is resulted. Will continue to monitor.

## 2020-04-09 NOTE — ED Notes (Signed)
Delay in report requested by ICU- responding to rapid response.

## 2020-04-09 NOTE — ED Notes (Signed)
Pt reminded to stay in bed for patient safety, pt c/o SOB with exertion but RN found patient walking around room.

## 2020-04-09 NOTE — ED Notes (Signed)
Per Dr. Kathrynn Humble, collect new ordered CBC after transfusion of 1 bag RBC before starting second bag of RBC. First bag started at 1516.

## 2020-04-09 NOTE — Progress Notes (Signed)
CRITICAL VALUE ALERT  Critical Value:  Lactic Acid 4.0  Date & Time Notied:  04/09/2020 2220   Provider Notified: E-Link  Orders Received/Actions taken: waiting for new orders.

## 2020-04-10 ENCOUNTER — Inpatient Hospital Stay: Payer: Self-pay

## 2020-04-10 ENCOUNTER — Inpatient Hospital Stay (HOSPITAL_COMMUNITY): Payer: Medicaid Other

## 2020-04-10 DIAGNOSIS — I361 Nonrheumatic tricuspid (valve) insufficiency: Secondary | ICD-10-CM

## 2020-04-10 DIAGNOSIS — R570 Cardiogenic shock: Secondary | ICD-10-CM | POA: Diagnosis not present

## 2020-04-10 DIAGNOSIS — D649 Anemia, unspecified: Secondary | ICD-10-CM | POA: Diagnosis not present

## 2020-04-10 LAB — BASIC METABOLIC PANEL
Anion gap: 15 (ref 5–15)
Anion gap: 17 — ABNORMAL HIGH (ref 5–15)
Anion gap: 12 (ref 5–15)
BUN: 48 mg/dL — ABNORMAL HIGH (ref 6–20)
BUN: 49 mg/dL — ABNORMAL HIGH (ref 6–20)
BUN: 48 mg/dL — ABNORMAL HIGH (ref 6–20)
CO2: 18 mmol/L — ABNORMAL LOW (ref 22–32)
CO2: 21 mmol/L — ABNORMAL LOW (ref 22–32)
CO2: 27 mmol/L (ref 22–32)
Calcium: 8.6 mg/dL — ABNORMAL LOW (ref 8.9–10.3)
Calcium: 9.1 mg/dL (ref 8.9–10.3)
Calcium: 9 mg/dL (ref 8.9–10.3)
Chloride: 103 mmol/L (ref 98–111)
Chloride: 101 mmol/L (ref 98–111)
Chloride: 100 mmol/L (ref 98–111)
Creatinine, Ser: 1.94 mg/dL — ABNORMAL HIGH (ref 0.61–1.24)
Creatinine, Ser: 2.05 mg/dL — ABNORMAL HIGH (ref 0.61–1.24)
Creatinine, Ser: 2.05 mg/dL — ABNORMAL HIGH (ref 0.61–1.24)
GFR calc Af Amer: 44 mL/min — ABNORMAL LOW (ref >60)
GFR calc Af Amer: 41 mL/min — ABNORMAL LOW (ref >60)
GFR calc Af Amer: 41 mL/min — ABNORMAL LOW (ref >60)
GFR calc non Af Amer: 38 mL/min — ABNORMAL LOW (ref >60)
GFR calc non Af Amer: 36 mL/min — ABNORMAL LOW (ref >60)
GFR calc non Af Amer: 36 mL/min — ABNORMAL LOW (ref >60)
Glucose, Bld: 105 mg/dL — ABNORMAL HIGH (ref 70–99)
Glucose, Bld: 94 mg/dL (ref 70–99)
Glucose, Bld: 154 mg/dL — ABNORMAL HIGH (ref 70–99)
Potassium: 6 mmol/L — ABNORMAL HIGH (ref 3.5–5.1)
Potassium: 6.1 mmol/L — ABNORMAL HIGH (ref 3.5–5.1)
Potassium: 4.4 mmol/L (ref 3.5–5.1)
Sodium: 136 mmol/L (ref 135–145)
Sodium: 139 mmol/L (ref 135–145)
Sodium: 139 mmol/L (ref 135–145)

## 2020-04-10 LAB — CBC WITH DIFFERENTIAL/PLATELET
Abs Immature Granulocytes: 0.1 10*3/uL — ABNORMAL HIGH (ref 0.00–0.07)
Basophils Absolute: 0 10*3/uL (ref 0.0–0.1)
Basophils Relative: 0 %
Eosinophils Absolute: 0 10*3/uL (ref 0.0–0.5)
Eosinophils Relative: 0 %
HCT: 43 % (ref 39.0–52.0)
Hemoglobin: 14.2 g/dL (ref 13.0–17.0)
Immature Granulocytes: 1 %
Lymphocytes Relative: 12 %
Lymphs Abs: 1.3 10*3/uL (ref 0.7–4.0)
MCH: 28.2 pg (ref 26.0–34.0)
MCHC: 33 g/dL (ref 30.0–36.0)
MCV: 85.3 fL (ref 80.0–100.0)
Monocytes Absolute: 1.1 10*3/uL — ABNORMAL HIGH (ref 0.1–1.0)
Monocytes Relative: 10 %
Neutro Abs: 8.7 10*3/uL — ABNORMAL HIGH (ref 1.7–7.7)
Neutrophils Relative %: 77 %
Platelets: 233 10*3/uL (ref 150–400)
RBC: 5.04 MIL/uL (ref 4.22–5.81)
RDW: 19.4 % — ABNORMAL HIGH (ref 11.5–15.5)
WBC: 11.3 10*3/uL — ABNORMAL HIGH (ref 4.0–10.5)
nRBC: 0.3 % — ABNORMAL HIGH (ref 0.0–0.2)

## 2020-04-10 LAB — LACTIC ACID, PLASMA
Lactic Acid, Venous: 5.4 mmol/L (ref 0.5–1.9)
Lactic Acid, Venous: 3.7 mmol/L (ref 0.5–1.9)
Lactic Acid, Venous: 3.9 mmol/L (ref 0.5–1.9)

## 2020-04-10 LAB — HIV ANTIBODY (ROUTINE TESTING W REFLEX): HIV Screen 4th Generation wRfx: NONREACTIVE

## 2020-04-10 LAB — MAGNESIUM: Magnesium: 2.1 mg/dL (ref 1.7–2.4)

## 2020-04-10 LAB — TROPONIN I (HIGH SENSITIVITY): Troponin I (High Sensitivity): 124 ng/L (ref <18)

## 2020-04-10 LAB — ECHOCARDIOGRAM COMPLETE
Height: 70 in
Weight: 2752 oz

## 2020-04-10 LAB — COOXEMETRY PANEL
Carboxyhemoglobin: 1.4 % (ref 0.5–1.5)
Carboxyhemoglobin: 1.6 % — ABNORMAL HIGH (ref 0.5–1.5)
Methemoglobin: 0.9 % (ref 0.0–1.5)
Methemoglobin: 0.9 % (ref 0.0–1.5)
O2 Saturation: 37.3 %
O2 Saturation: 62.7 %
Total hemoglobin: 14 g/dL (ref 12.0–16.0)
Total hemoglobin: 13.3 g/dL (ref 12.0–16.0)

## 2020-04-10 LAB — DIGOXIN LEVEL: Digoxin Level: <0.2 ng/mL — ABNORMAL LOW (ref 0.8–2.0)

## 2020-04-10 LAB — MRSA PCR SCREENING: MRSA by PCR: NEGATIVE

## 2020-04-10 LAB — GLUCOSE, CAPILLARY: Glucose-Capillary: 118 mg/dL — ABNORMAL HIGH (ref 70–99)

## 2020-04-10 LAB — SARS CORONAVIRUS 2 (TAT 6-24 HRS): SARS Coronavirus 2: NEGATIVE

## 2020-04-10 MED ORDER — ASPIRIN EC 81 MG PO TBEC
81.0000 mg | DELAYED_RELEASE_TABLET | Freq: Every day | ORAL | Status: DC
  Administered 2020-04-10 – 2020-04-15 (×6): 81 mg via ORAL
  Filled 2020-04-10 (×6): qty 1

## 2020-04-10 MED ORDER — SODIUM CHLORIDE 0.9% FLUSH: 10.0000 mL | INTRAVENOUS | Status: DC | PRN

## 2020-04-10 MED ORDER — CHLORHEXIDINE GLUCONATE CLOTH 2 % EX PADS
6.0000 | MEDICATED_PAD | Freq: Every day | CUTANEOUS | Status: DC
  Administered 2020-04-10 – 2020-04-15 (×5): 6 via TOPICAL

## 2020-04-10 MED ORDER — PANTOPRAZOLE SODIUM 40 MG PO TBEC
40.0000 mg | DELAYED_RELEASE_TABLET | Freq: Every day | ORAL | Status: DC
  Administered 2020-04-10 – 2020-04-15 (×6): 40 mg via ORAL
  Filled 2020-04-10 (×6): qty 1

## 2020-04-10 MED ORDER — MILRINONE LACTATE IN DEXTROSE 20-5 MG/100ML-% IV SOLN
0.1250 ug/kg/min | INTRAVENOUS | Status: DC
  Administered 2020-04-10 – 2020-04-11 (×2): 0.25 ug/kg/min via INTRAVENOUS
  Filled 2020-04-10 (×2): qty 100

## 2020-04-10 MED ORDER — SODIUM ZIRCONIUM CYCLOSILICATE 10 G PO PACK
10.0000 g | PACK | Freq: Once | ORAL | Status: AC
  Administered 2020-04-10: 11:00:00 10 g via ORAL
  Filled 2020-04-10: qty 1

## 2020-04-10 MED ORDER — MILRINONE LACTATE IN DEXTROSE 20-5 MG/100ML-% IV SOLN
0.2500 ug/kg/min | INTRAVENOUS | Status: DC
  Filled 2020-04-10: qty 100

## 2020-04-10 MED ORDER — CARVEDILOL 6.25 MG PO TABS: 6.2500 mg | ORAL_TABLET | Freq: Two times a day (BID) | ORAL | Status: DC

## 2020-04-10 MED ORDER — ATORVASTATIN CALCIUM 80 MG PO TABS
80.0000 mg | ORAL_TABLET | Freq: Every day | ORAL | Status: DC
  Administered 2020-04-10 – 2020-04-15 (×6): 80 mg via ORAL
  Filled 2020-04-10: qty 1
  Filled 2020-04-10: qty 2
  Filled 2020-04-10 (×3): qty 1
  Filled 2020-04-10: qty 2

## 2020-04-10 MED ORDER — SIMETHICONE 80 MG PO CHEW
160.0000 mg | CHEWABLE_TABLET | Freq: Once | ORAL | Status: AC
  Administered 2020-04-10: 160 mg via ORAL
  Filled 2020-04-10: qty 2

## 2020-04-10 MED ORDER — FUROSEMIDE 10 MG/ML IJ SOLN
160.0000 mg | Freq: Two times a day (BID) | INTRAVENOUS | Status: DC
  Administered 2020-04-10 (×2): 160 mg via INTRAVENOUS
  Filled 2020-04-10 (×5): qty 16

## 2020-04-10 MED ORDER — FUROSEMIDE 40 MG PO TABS: 40.0000 mg | ORAL_TABLET | Freq: Two times a day (BID) | ORAL | Status: DC

## 2020-04-10 MED ORDER — HYDRALAZINE HCL 10 MG PO TABS
10.0000 mg | ORAL_TABLET | Freq: Three times a day (TID) | ORAL | Status: DC
  Administered 2020-04-10 – 2020-04-11 (×3): 10 mg via ORAL
  Filled 2020-04-10 (×4): qty 1

## 2020-04-10 MED ORDER — SODIUM CHLORIDE 0.9% FLUSH
10.0000 mL | Freq: Two times a day (BID) | INTRAVENOUS | Status: DC
  Administered 2020-04-10 – 2020-04-14 (×5): 10 mL

## 2020-04-10 MED ORDER — HEPARIN SODIUM (PORCINE) 5000 UNIT/ML IJ SOLN
5000.0000 [IU] | Freq: Three times a day (TID) | INTRAMUSCULAR | Status: DC
  Administered 2020-04-10 – 2020-04-15 (×16): 5000 [IU] via SUBCUTANEOUS
  Filled 2020-04-10 (×16): qty 1

## 2020-04-10 MED ORDER — ISOSORBIDE DINITRATE 5 MG PO TABS
5.0000 mg | ORAL_TABLET | Freq: Three times a day (TID) | ORAL | Status: DC
  Administered 2020-04-10 (×2): 5 mg via ORAL
  Filled 2020-04-10 (×5): qty 1

## 2020-04-10 NOTE — Progress Notes (Signed)
  Echocardiogram 2D Echocardiogram has been performed.  Darlina Sicilian M 04/10/2020, 12:22 PM

## 2020-04-10 NOTE — Progress Notes (Signed)
CRITICAL VALUE ALERT  Critical Value:  LA 3.9  Date & Time Notied:  04/10/20 9:09 PM  Provider Notified: Warren Lacy

## 2020-04-10 NOTE — Progress Notes (Signed)
[facility] Progress Note Patient Name: Tyler Wong DOB: June 11, 1966 MRN: [id number]   Date of Service  04/10/2020  HPI/Events of Note  Lactic Acid = 3.7 --> 3.9. Will need to recheck COOX on Milrinone.   eICU Interventions  Will order: 1. COOX at 10 PM.      Intervention Category Major Interventions: Acid-Base disturbance - evaluation and management  Lysle Dingwall 04/10/2020, 9:23 PM

## 2020-04-10 NOTE — Progress Notes (Signed)
[facility] Progress Note Patient Name: Ohene Petrosyan DOB: 01-01-1966 MRN: [id number]   Date of Service  04/10/2020  HPI/Events of Note  Troponin = 173 --> 124. EKG: Sinus tachycardia. HR = 104.  Biatrial enlargement. Left axis deviation. Left ventricular hypertrophy. Lateral infarct , age undetermined. Inferior infarct , age undetermined  eICU Interventions  Clinical picture suggests demand ischemia.      Intervention Category Major Interventions: Other:  Lysle Dingwall 04/10/2020, 2:13 AM

## 2020-04-10 NOTE — Progress Notes (Signed)
[facility] Progress Note Patient Name: Tyler Wong DOB: [birth year] MRN: [id number]   Date of Service  04/10/2020  HPI/Events of Note  Patient c/o intestinal gas.   eICU Interventions  Will order: 1. Simethicone 160 mg PO X 1 now.      Intervention Category Major Interventions: Other:  Lysle Dingwall 04/10/2020, 6:27 AM

## 2020-04-10 NOTE — Consult Note (Addendum)
Cardiology Consultation:   Patient ID: Tyler Wong MRN: [id number]; DOB: June 01, 1966  Admit date: 04/09/2020 Date of Consult: 04/10/2020  Primary Care Provider: [name], NP Primary Cardiologist: Shelva Majestic, MD  Primary Electrophysiologist:  None    Patient Profile:   Tyler Wong is a 54 y.o. male with a hx of CAD with MI in 2015 s/p DES to LAD, HLD, HTN, CHF (EF 25%-30 01/2019), cocaine use, tobacco use, severe MR, and COPD who is being seen today for the evaluation of heart failure at the request of Dr. Vaughan Browner.  History of Present Illness:   Tyler Wong has history of CAD with MI in 2015 with LAD stent. EF at that time was 40-45%.  was Patient admitted 09/2018 for sob and chest pain. Echo in 2019 showed EF 30-35%.  Patient was admitted 09/2018 for chest pain and sob. Echo 09/2018 showed decrease function 20-25%. Cath showed 30% in stent restenosis of LAD, nondominant RCA providing a PLV with 70% stenosis, 90% ostial OM1. No PCI targets; decreased EF thought to be due to cocaine use. Continued medical therapy. Admitted 04/2019 with volume overload in the setting of cocaine use and noncompliance. Echo showed improved EF 35-40%. Last seen 01/30/20 by Dr. Claiborne Billings and patient reported compliance with meds and shortness of breath. Echo was ordered for follow-up of MR which showed EF 25-30%, G2DD, mildly reduced RV function, severely dilated atrium, severe MR. He was referred to Dr. Burt Knack for Mitral Valve clip evaluation.  The patient presented to the ED 4/13 for shortness of breath, abdominal pain found to have symptomatic anemia with hemoglobin 3.6. He had been seen in the ED 4/5 and Hgb was 13.3 at that time. He said symptoms were worsening over the last week. Also had orthopnea and lower leg edema. Stomach was feeling full. No chest pain. He ran out of lasix 6 days ago. He has not been consistent with his other heart failure medications taking them every other day. He last used cocaine 2  weeks ago. He still smokes. He tries to follow low salt diet. In the ED other labs showed potassium 4.5, BUN 41, creatinine 1.76, alk phos 154, lipase 178, AST 92, ALT 321, total bili 2.3. BNP 1,195. WBC 2.9. HS troponin 173>124. Lactic acid 4.0. Vitals were stable other than RR 22. Urine cocaine positive. CXR unremarkable. CTA chest/abd/pelvis negative for aortic dissection, aneurysm, dilated hepatic veins, small right sided-pleural effusion. EKG showed sinus tachycardia, LVH, LAD, biatrial enlargement. He was [facility] to [facility].    Past Medical History:  Diagnosis Date  . CAD in native artery 07/17/14   STEMI- LAD stenosis with DES  . CHF (congestive heart failure) ([place])   . Cocaine abuse ([place])   . COPD (chronic obstructive pulmonary disease) ([place])   . Dyspnea   . Hypertension   . ST elevation myocardial infarction (STEMI) involving left anterior descending (LAD) coronary artery with complication ([place])   . Tobacco abuse 07/17/2014    Past Surgical History:  Procedure Laterality Date  . CORONARY ANGIOPLASTY WITH STENT PLACEMENT  07/18/14   resolute DES to LAD STEMI  . LEFT HEART CATH N/A 07/17/2014   Procedure: LEFT HEART CATH;  Surgeon: [name], MD;  Location: [facility] CATH LAB;  Service: Cardiovascular;  Laterality: N/A;  . PERCUTANEOUS CORONARY STENT INTERVENTION (PCI-S)  07/17/2014   Procedure: PERCUTANEOUS CORONARY STENT INTERVENTION (PCI-S);  Surgeon: [name], MD;  Location: [facility] CATH LAB;  Service: Cardiovascular;;  . RIGHT/LEFT HEART CATH AND CORONARY  ANGIOGRAPHY N/A 10/03/2018   Procedure: RIGHT/LEFT HEART CATH AND CORONARY ANGIOGRAPHY;  Surgeon: [name], MD;  Location: [place] CV LAB;  Service: Cardiovascular;  Laterality: N/A;     Home Medications:  Prior to Admission medications   Medication Sig Start Date End Date Taking? Authorizing Provider  albuterol (VENTOLIN HFA) 108 (90 Base) MCG/ACT inhaler Inhale 2 puffs into the lungs every 4 (four) hours as needed  for wheezing or shortness of breath. 01/24/20  Yes [name], NP  aspirin 81 MG EC tablet Take 1 tablet (81 mg total) by mouth daily. 10/06/18  Yes Freeman Caldron M, PA-C  carvedilol (COREG) 6.25 MG tablet Take 1 tablet (6.25 mg total) by mouth 2 (two) times daily with a meal. 01/30/20 04/29/20 Yes [name], MD  docusate sodium (COLACE) 100 MG capsule Take 100 mg by mouth daily as needed for mild constipation.   Yes [provider]  furosemide (LASIX) 40 MG tablet Take 40 mg by mouth daily.   Yes [provider]  Multiple Vitamin (MULTIVITAMIN) tablet Take 1 tablet by mouth daily.   Yes [provider]  NITROSTAT 0.4 MG SL tablet PLACE 1 TABLET UNDER TONGUE AS NEEDED FOR CHEST PAIN EVERY 5 MINUTES X 3 MAX DOSES.  CALL 911 IF PAIN PERSISTS Patient taking differently: Place 0.4 mg under the tongue every 5 (five) minutes as needed for chest pain.  02/10/18  Yes [name], MD  omeprazole (PRILOSEC) 20 MG capsule Take 1 capsule (20 mg total) by mouth daily. Patient taking differently: Take 20 mg by mouth daily as needed (reflux/heartburn).  01/24/20  Yes Edwards, Milford Cage, NP  sacubitril-valsartan (ENTRESTO) 24-26 MG Take 1 tablet by mouth 2 (two) times daily. 03/28/19  Yes Georgiana Shore, NP  spironolactone (ALDACTONE) 25 MG tablet Take 0.5 tablets (12.5 mg total) by mouth daily. 01/30/20 04/09/20 Yes [name], MD  atorvastatin (LIPITOR) 80 MG tablet Take 1 tablet (80 mg total) by mouth daily. Patient not taking: Reported on 04/09/2020 03/28/19   Georgiana Shore, NP  digoxin (LANOXIN) 0.125 MG tablet Take 0.5 tablets (0.0625 mg total) by mouth daily. Patient not taking: Reported on 04/09/2020 01/30/20   [name], MD    Inpatient Medications: Scheduled Meds: . Chlorhexidine Gluconate Cloth  6 each Topical Daily  . pantoprazole (PROTONIX) IV  40 mg Intravenous Q12H  . sodium chloride flush  3 mL Intravenous Once   Continuous Infusions:  PRN  Meds:   Allergies:   No Known Allergies  Social History:   Social History   Socioeconomic History  . Marital status: Single    Spouse name: Not on file  . Number of children: 2  . Years of education: 34  . Highest education level: 12th grade  Occupational History  . Occupation: Landscaping  Tobacco Use  . Smoking status: Current Every Day Smoker    Packs/day: 0.50    Years: 30.00    Pack years: 15.00    Types: Cigarettes  . Smokeless tobacco: Never Used  . Tobacco comment: .5 PK PER DAY  Substance and Sexual Activity  . Alcohol use: No    Comment: Patient denies abuse, states social drinker  . Drug use: Not Currently    Types: Cocaine    Comment: heroin  . Sexual activity: Yes  Other Topics Concern  . Not on file  Social History Narrative   Lives in [place] with his sister and girlfriend. He works [employer]  doing landscape work.    Social Determinants of Health   Financial Resource Strain:   . Difficulty of Paying Living Expenses:   Food Insecurity:   . Worried About Charity fundraiser in the Last Year:   . Arboriculturist in the Last Year:   Transportation Needs:   . Film/video editor (Medical):   Marland Kitchen Lack of Transportation (Non-Medical):   Physical Activity:   . Days of Exercise per Week:   . Minutes of Exercise per Session:   Stress:   . Feeling of Stress :   Social Connections:   . Frequency of Communication with Friends and Family:   . Frequency of Social Gatherings with Friends and Family:   . Attends Religious Services:   . Active Member of Clubs or Organizations:   . Attends Archivist Meetings:   Marland Kitchen Marital Status:   Intimate Partner Violence:   . Fear of Current or Ex-Partner:   . Emotionally Abused:   Marland Kitchen Physically Abused:   . Sexually Abused:     Family History:   Family History  Problem Relation Age of Onset  . Cirrhosis Father   . Heart attack Mother   . Heart attack Sister   . Heart failure Sister   . Heart attack  Brother      ROS:  Please see the history of present illness.  All other ROS reviewed and negative.     Physical Exam/Data:   Vitals:   04/10/20 0500 04/10/20 0600 04/10/20 0700 04/10/20 0800  BP: 114/78 (!) 110/42 (!) 120/101   Pulse: 94 99 95   Resp: 17 (!) 26 13   Temp:    (!) 97.2 F (36.2 C)  TempSrc:    Axillary  SpO2: 100% 94% 100%   Weight:      Height:        Intake/Output Summary (Last 24 hours) at 04/10/2020 0937 Last data filed at 04/10/2020 0400 Gross per 24 hour  Intake 1111 ml  Output 1075 ml  Net 36 ml   Last 3 Weights 04/09/2020 04/09/2020 04/01/2020  Weight (lbs) 172 lb 176 lb 179 lb 0.2 oz  Weight (kg) 78.019 kg 79.833 kg 81.2 kg     Body mass index is 24.68 kg/m.  General:  Well nourished, well developed, in no acute distress HEENT: normal Lymph: no adenopathy Neck: + JVD Endocrine:  No thryomegaly Vascular: No carotid bruits; FA pulses 2+ bilaterally without bruits  Cardiac:  normal S1, S2; RRR; + murmur  Lungs:  Mild crackles  Abd: soft, nontender, no hepatomegaly  Ext: 1+ edema Musculoskeletal:  No deformities, BUE and BLE strength normal and equal Skin: warm and dry  Neuro:  CNs 2-12 intact, no focal abnormalities noted Psych:  Normal affect   EKG:  The EKG was personally reviewed and demonstrates:   EKG showed sinus tachycardia, LVH, LAD, biatrial enlargement Telemetry:  Telemetry was personally reviewed and demonstrates:  NSR, sinus tachycardia, HR 90-105bpm  Relevant CV Studies:  Echo 02/09/20 1. Left ventricular ejection fraction, by estimation, is 25-30%. The left  ventricle has severely decreased function. The left ventricle demonstrates  global hypokinesis, best preserved function in lateral wall. The left  ventricular internal cavity size  was severely dilated. Left ventricular diastolic parameters are consistent  with Grade III diastolic dysfunction (restrictive). Elevated left  ventricular end-diastolic pressure.  2. Right  ventricular systolic function is mildly reduced. The right  ventricular size is moderately enlarged. There is moderate-severely  elevated pulmonary artery systolic pressure. The estimated right  ventricular systolic pressure is 38.2 mmHg.  3. Left atrial size was severely dilated.  4. Right atrial size was severely dilated.  5. Tenting of mitral valve leaflets due to LV dysfunction. The mitral  valve is grossly normal. Severe mitral valve regurgitation. No evidence of  mitral stenosis.  6. Mechanism of TR likely annular dilation, lack of coaptation of  tricuspid valve leaflets. Tricuspid valve regurgitation is severe.  7. The aortic valve is tricuspid. Aortic valve regurgitation is trivial .  No aortic stenosis is present.  8. The inferior vena cava is dilated in size with <50% respiratory  variability, suggesting right atrial pressure of 15 mmHg.   Cardiac cath 09/2018 1. Elevated left heart filling pressure (PCWP and LVEDP).  2. Preserved cardiac output.  3. Primarily pulmonary venous hypertension.  4. Nondominant RCA providing a PLV with 70% stenosis.  90% ostial moderate OM1.  Will plan medical management of coronary disease.  No chest pain and does not explain fall in EF.  Laboratory Data:  High Sensitivity Troponin:   Recent Labs  Lab 04/01/20 0427 04/01/20 0629 04/09/20 1745 04/10/20 0035  TROPONINIHS 111* 104* 173* 124*     Chemistry Recent Labs  Lab 04/09/20 1342 04/10/20 0611  NA 139 136  K 4.5 6.0*  CL 100 103  CO2 26 18*  GLUCOSE 114* 105*  BUN 41* 48*  CREATININE 1.76* 1.94*  CALCIUM 9.8 8.6*  GFRNONAA 43* 38*  GFRAA 50* 44*  ANIONGAP 13 15    Recent Labs  Lab 04/09/20 1342  PROT 9.0*  ALBUMIN 4.0  AST 92*  ALT 321*  ALKPHOS 154*  BILITOT 2.3*   Hematology Recent Labs  Lab 04/09/20 1209 04/09/20 1209 04/09/20 1342 04/09/20 1745 04/09/20 2108 04/10/20 0611  WBC 2.9*  --   --  11.1*  --  11.3*  RBC 1.33*  --  5.46 4.82  --  5.04    HGB 3.6*   < >  --  13.4 15.6 14.2  HCT 12.1*   < >  --  42.3 46.1 43.0  MCV 91.0  --   --  87.8  --  85.3  MCH 27.1  --   --  27.8  --  28.2  MCHC 29.8*  --   --  31.7  --  33.0  RDW 19.8*  --   --  19.8*  --  19.4*  PLT 253  --   --  232  --  233   < > = values in this interval not displayed.   BNP Recent Labs  Lab 04/09/20 1209  BNP 1,195.9*    DDimer No results for input(s): DDIMER in the last 168 hours.   Radiology/Studies:  DG CHEST PORT 1 VIEW  Result Date: 04/09/2020 CLINICAL DATA:  Shortness of breath EXAM: PORTABLE CHEST 1 VIEW COMPARISON:  04/09/2020 FINDINGS: Cardiac shadow is enlarged but stable. The lungs are well aerated bilaterally. No focal infiltrate or sizable effusion is seen. Mild areas of scarring in the right upper lobe are seen. No other focal abnormality is noted. IMPRESSION: No acute abnormality noted. Electronically Signed   By: Inez Catalina M.D.   On: 04/09/2020 20:14   DG Chest Port 1 View  Result Date: 04/09/2020 CLINICAL DATA:  Shortness of breath. EXAM: PORTABLE CHEST 1 VIEW COMPARISON:  04/01/2020 FINDINGS: Lungs are adequately inflated without focal airspace consolidation or effusion. Minimal stable scarring right upper lung. Stable cardiomegaly. Remainder  the exam is unchanged. IMPRESSION: No acute cardiopulmonary disease. Stable cardiomegaly. Electronically Signed   By: Marin Olp M.D.   On: 04/09/2020 12:54   CT Angio Chest/Abd/Pel for Dissection W and/or Wo Contrast  Result Date: 04/09/2020 CLINICAL DATA:  Chest pain, shortness of breath, severe anemia and abdominal pain. History of cocaine abuse. Evaluate for possible dissection or retroperitoneal bleed. EXAM: CT ANGIOGRAPHY CHEST, ABDOMEN AND PELVIS TECHNIQUE: Multidetector CT imaging through the chest, abdomen and pelvis was performed using the standard protocol during bolus administration of intravenous contrast. Multiplanar reconstructed images and MIPs were obtained and reviewed to  evaluate the vascular anatomy. CONTRAST:  184m OMNIPAQUE IOHEXOL 350 MG/ML SOLN COMPARISON:  CT abdomen/pelvis 10/23/2017 and CT angio chest, FINDINGS: CTA CHEST FINDINGS Cardiovascular: Mild to moderate cardiac enlargement. Four-chamber enlargement but most notably the left ventricle. The aorta is normal in caliber. No dissection. The aortic branch vessels are patent. Minimal scattered atherosclerotic calcifications at the aortic arch. Fairly extensive/age advanced three-vessel coronary artery calcifications. Moderate reflux of contrast down the IVC and into dilated hepatic veins suggesting tricuspid regurgitation or some component of right heart failure. Mediastinum/Nodes: No mediastinal or hilar mass or lymphadenopathy. The esophagus is grossly normal. Lungs/Pleura: Stable mild emphysematous changes and areas of pulmonary scarring. There are also some areas of mild bronchiectasis. I do not see any worrisome pulmonary lesions or pulmonary nodules. There is a small right-sided pleural effusion with minimal overlying atelectasis. Musculoskeletal: No significant bony findings. Review of the MIP images confirms the above findings. CTA ABDOMEN AND PELVIS FINDINGS VASCULAR Aorta: Normal Celiac: Normal SMA: Normal Renals: Normal IMA: Normal Inflow: Normal Veins: Normal Review of the MIP images confirms the above findings. NON-VASCULAR Hepatobiliary: Severe hepatic congestion findings with reflux of contrast down the IVC, into the dilated hepatic veins and into the adjacent lung parenchyma likely due to heart failure and or tricuspid regurgitation. Small cluster of calcifications in the right hepatic lobe. No worrisome hepatic lesions. No biliary dilatation. The gallbladder is grossly normal. No common bile duct dilatation. Pancreas: No mass, inflammation or ductal dilatation. Spleen: Normal size. No focal lesions. Adrenals/Urinary Tract: The adrenal glands and kidneys are grossly normal. The bladder is unremarkable.  Stomach/Bowel: The stomach, duodenum, small bowel and colon are grossly normal without oral contrast. No acute inflammatory changes, mass lesions or obstructive findings. The terminal ileum and appendix are normal. Lymphatic: No mesenteric or retroperitoneal mass, adenopathy or hematoma. Reproductive: The prostate gland and seminal vesicles are unremarkable. Other: Mild mesenteric edema with a small amount of free abdominal/pelvic fluid. There is also body wall edema. Findings suggest anasarca are related to chronic CHF. Musculoskeletal: No significant bony findings. Review of the MIP images confirms the above findings. IMPRESSION: 1. No CT findings for aortic dissection or aneurysm. 2. Four-chamber enlargement but most notably the left ventricle. 3. Age advanced three-vessel coronary artery calcifications. 4. Reflux of contrast down the IVC and into the dilated hepatic veins and into the adjacent hepatic parenchyma (hepatic congestion) likely due to heart failure and or tricuspid regurgitation. 5. Small right-sided pleural effusion and minimal overlying atelectasis. 6. Mild emphysematous changes and pulmonary scarring. 7. No acute abdominal/pelvic findings, mass lesions or adenopathy. 8. Emphysema and aortic atherosclerosis. Aortic Atherosclerosis (ICD10-I70.0) and Emphysema (ICD10-J43.9). Electronically Signed   By: PMarijo SanesM.D.   On: 04/09/2020 15:28   {   Assessment and Plan:   Acute anemia, resolved - On arrival Hgb 3.6. Patient was transfused 2 units of blood. Hgb was re-checked which  showed 13.6>> possible lab error - Fecal occult negative. Patient denies active bleeding - Hgb 14.2 today. Continue to monitor  Acute on chronic systolic and diastolic HF Patient presented with 1 week of worsening SOB and abdominal pain. Patient reported noncompliance with meds, ran out of lasix 6 days ago. CTA chest showed small right sided pleural effusion and hepatic congestion. LFTs were elevated. BNP up to  11,195 with lactic acidosis. Given IV lasix x1. - started on home lasix 47m BID - At home patient is on Coreg 6.280mBID, spironolactone 12.46m37mID, Digoxin 0.1246m446mily, Entresto 24-246mg86m>>held on admission. - Recent Echo 01/2019 showed EF 25-30%, G3DD, moderate pulmonary HTN, severe MR. Will repeat - patient has put out 1L urine overnight - JVD and swelling on exam. Spoke with MD>>Increase lasix to 160mg 67m -  hold Entresto and Spiro for worsening kidney function - check digoxin level - start hydralazine and Isordil - MD to see  AKI on CKD stage 3 - baseline around 1.3 - on admission was 1.76. BUN 41 - Today creatinine 1.94 - Hold spiro and entresto  CAD s/p DES LAD - continue Aspirin and lipitor  - no chest pain [place] but no PCI targets - Hs trop mildly elevated with flat trend  HTN - IV lasix as above - coreg and entresto and spiro held - pressures stable right now, continue to monitor for low pressures - Hydralazine and Isordil  HLD - atorvastatin>>might need to hold if LFTs do not improve - no recent labs  Substance abuse - Urine drug screen cocaine positive - Recommend Tobacco cessation  Severe MR - seen on recent echo - referred to Dr. CooperBurt Knackossible Mitral valve clip procedure   For questions or updates, please contact [facility] Please consult [email or web address] for contact info under     Signed, Kalaysia Demonbreun H FurtNinfa Meeker  04/10/2020 9:37 AM

## 2020-04-10 NOTE — Progress Notes (Signed)
[place] Progress Note Patient Name: Tyler Wong DOB: 1966/03/13 MRN: [id number]   Date of Service  04/10/2020  HPI/Events of Note  No AM lab orders.  eICU Interventions  Will order: 1. CBC with platelets, BMP and Mg++ level at 5 AM.      Intervention Category Major Interventions: Other:  Lysle Dingwall 04/10/2020, 4:29 AM

## 2020-04-10 NOTE — Progress Notes (Signed)
NAME:  Tyler Wong, MRN:  [id number], DOB:  October 06, 1966, LOS: 1 ADMISSION DATE:  04/09/2020, CONSULTATION DATE:  04/09/20 REFERRING MD:  Dollene Cleveland MD, CHIEF COMPLAINT: Symptomatic anemia, CHF  Brief History   54 year old with history of coronary artery disease, CHF [EF 25%], cocaine use, COPD Admitted with shortness of breath, abdominal pain.  ->initailly admitted w/ working dx of symptomatic anemia (hgb 3.6) received 1 unit of blood, CBC follow-up was obtained in between transfusions yielding hemoglobin of 13.4, so second transfusion discontinued  Past Medical History    has a past medical history of CAD in native artery (07/17/14), CHF (congestive heart failure) ([place]), Cocaine abuse ([place]), COPD (chronic obstructive pulmonary disease) ([place]), Dyspnea, Hypertension, ST elevation myocardial infarction (STEMI) involving left anterior descending (LAD) coronary artery with complication ([place]), and Tobacco abuse (07/17/2014).  Significant Hospital Events   4/13- admit with chief complaint of abdominal pain and shortness of breath, initially admitted with a working diagnosis of symptomatic anemia; Initial hemoglobin was noted at 3.6, however after only 1 unit was 13.4, in light of no active clear source of bleeding and hemodynamic stability it was determined initial reading was likely erroneous.  Additional ER findings included lactic acidosis, with lactate of 4, acute on chronic renal injury  with serum creatinine up to 1.76 from baseline of 1.3, mildly elevated troponin, elevated BNP up to 1195, urine drug screen positive for cocaine. 4/14: Troponin trending down, hemoglobin remaining stable at 14.2, worsening renal failure with serum creatinine up to 1.94, worsening hyperkalemia, mild anion gap metabolic acidosis   Consults:   Cards eval  Procedures:  CTA chest abdomen pelvis 04/09/2020-cardiac enlargement, coronary artery calcification, mild emphysema with pulmonary scarring.  No acute abdominal  process.  Significant Diagnostic Tests:    Micro Data:    Antimicrobials:    Interim history/subjective:  No issues overnight  Objective   Blood pressure (Abnormal) 120/101, pulse 95, temperature (Abnormal) 97.2 F (36.2 C), temperature source Axillary, resp. rate 13, height 5\' 10"  (1.778 m), weight 78 kg, SpO2 100 %.        Intake/Output Summary (Last 24 hours) at 04/10/2020 0924 Last data filed at 04/10/2020 0400 Gross per 24 hour  Intake 1111 ml  Output 1075 ml  Net 36 ml   Filed Weights   04/09/20 0609 04/09/20 1251  Weight: 79.8 kg 78 kg    Examination:  General this pleasant 54 year old white male he is resting in bed in no acute distress HEENT normocephalic atraumatic no jugular venous distention appreciated Cardiac: Regular rate diastolic murmur appreciated soft Pulmonary clear to auscultation Abdomen nontender to palpation still complains of abdominal discomfort positive bowel sounds Extremities are warm and dry with brisk capillary refill Voids Neuro intact  Resolved Hospital Problem list   Anemia, this was a lab error  Assessment & Plan:     Acute on chronic systolic and diastolic heart failure w/ demand ischemia (suspect 2/2 cocaine abuse) Plan Add back lasix  Cont tele  Resume ASA Hold BB given cocaine abuse. I wonder if on-going BB is a wise choice for him going further given risk of cocaine abuse  Hold spironolactone for now given hyperkalemia Resume Lipitor Ask HF team to see   abd pain. Suspect transient demand ischemia I wonder if this is exacerbated by concomitant cocaine abuse and beta-blockade Plan Adv diet   Hyperkalemia  Plan Lokelma X 1 Hold further spironolactone   Acute on chronic renal failure ? IV dye induced Plan Lasix X 1  Trend chem Avoid nephrotoxins   Cocaine, tobacco abuse Plan Needs to stop    Best practice:  Diet: NPO-->advance  Pain/Anxiety/Delirium protocol (if indicated): NA VAP protocol (if  indicated): NA DVT prophylaxis: SCDs GI prophylaxis: PPI Glucose control: Monitor Mobility: Bed Code Status: Full Family Communication: patient updated Disposition:to tele awaiting cards eval  Erick Colace ACNP-BC [place] Pager # [phone] OR # [phone] if no answer

## 2020-04-10 NOTE — Plan of Care (Signed)
  Problem: Education: Goal: Knowledge of General Education information will improve Description Including pain rating scale, medication(s)/side effects and non-pharmacologic comfort measures Outcome: Progressing   Problem: Health Behavior/Discharge Planning: Goal: Ability to manage health-related needs will improve Outcome: Progressing   Problem: Clinical Measurements: Goal: Ability to maintain clinical measurements within normal limits will improve Outcome: Progressing   Problem: Clinical Measurements: Goal: Will remain free from infection Outcome: Progressing   Problem: Clinical Measurements: Goal: Diagnostic test results will improve Outcome: Progressing   Problem: Activity: Goal: Risk for activity intolerance will decrease Outcome: Progressing   

## 2020-04-10 NOTE — Progress Notes (Addendum)
Pharmacy Consult for Milrinone (Primacor) Initiation  Indication:   Acute Decompensated Heart Failure with volume overload and low cardiac output  No Known Allergies  Temp:  [97.2 F (36.2 C)-98.6 F (37 C)] 97.7 F (36.5 C) (04/14 1200) Pulse Rate:  [80-110] 103 (04/14 1400) Cardiac Rhythm: Sinus tachycardia (04/14 1200) Resp:  [11-32] 18 (04/14 1400) BP: (98-121)/(42-101) 111/89 (04/14 1400) SpO2:  [94 %-100 %] 100 % (04/14 1400)  LABS    Component Value Date/Time   NA 139 04/10/2020 1027   NA 141 01/30/2020 1645   K 6.1 (H) 04/10/2020 1027   CL 101 04/10/2020 1027   CO2 21 (L) 04/10/2020 1027   GLUCOSE 94 04/10/2020 1027   BUN 49 (H) 04/10/2020 1027   BUN 23 01/30/2020 1645   CREATININE 2.05 (H) 04/10/2020 1027   CALCIUM 9.1 04/10/2020 1027   GFRNONAA 36 (L) 04/10/2020 1027   GFRAA 41 (L) 04/10/2020 1027   Last magnesium:  Lab Results  Component Value Date   MG 2.1 04/10/2020   Estimated Creatinine Clearance: 42.5 mL/min (A) (by C-G formula based on SCr of 2.05 mg/dL (H)). Serum creatinine: 2.05 mg/dL (H) 04/10/20 1027 Estimated creatinine clearance: 42.5 mL/min (A) estimated creatinine clearance is 42.5 mL/min (A) (by C-G formula based on SCr of 2.05 mg/dL (H)).   Intake/Output Summary (Last 24 hours) at 04/10/2020 1659 Last data filed at 04/10/2020 1500 Gross per 24 hour  Intake 877 ml  Output 2550 ml  Net -1673 ml    Filed Weights   04/09/20 0609 04/09/20 1251  Weight: 79.8 kg (176 lb) 78 kg (172 lb)   Assessment:   54 y.o. male admitted 04/09/2020 with acute decompensated congestive heart failure to be initiated on milrinone. Cardiology also following   Patient with EF < 20%. He received lasix IV 40mg  on 4/13 and 160mg  IV on 4/14.  Patient's output is  ~ 1500 ml today.  -K= 6.1, Mg= 2.1 -SCr= 2.05 (trend up; baseline ~ 1.3-1.4)  He was given lokelma 10mg  x1 earlier today  Plan:  1. Initiate milrinone based on renal function: (Consider starting  dose of 0.125 - 0.25 for patients with SBP <174mmHg) [facility] Calculated CrCl Dose  []  > 50 ml/min 0.375 mcg/kg/min  [x]  20-49 ml/min 0.250 mcg/kg/min  []  < 20 ml/min 0.125 mcg/kg/min   2. Nursing to monitor vital signs per milrinone protocol and physician parameters. 3. Pharmacy to follow peripherally, please reconsult if needed or there is further questions. 4.  Please contact MD for further dosing instructions.  Thank you for allowing [place] to be a part of this patient's care.  Hildred Laser, PharmD Clinical Pharmacist **Pharmacist phone directory can now be found on [email or web address] ([email or web address]).  Listed under [place].

## 2020-04-11 ENCOUNTER — Inpatient Hospital Stay (HOSPITAL_COMMUNITY): Payer: Medicaid Other

## 2020-04-11 DIAGNOSIS — R570 Cardiogenic shock: Secondary | ICD-10-CM

## 2020-04-11 DIAGNOSIS — R579 Shock, unspecified: Secondary | ICD-10-CM

## 2020-04-11 DIAGNOSIS — N183 Chronic kidney disease, stage 3 unspecified: Secondary | ICD-10-CM

## 2020-04-11 DIAGNOSIS — I5041 Acute combined systolic (congestive) and diastolic (congestive) heart failure: Secondary | ICD-10-CM

## 2020-04-11 LAB — BASIC METABOLIC PANEL
Anion gap: 13 (ref 5–15)
Anion gap: 10 (ref 5–15)
BUN: 38 mg/dL — ABNORMAL HIGH (ref 6–20)
BUN: 35 mg/dL — ABNORMAL HIGH (ref 6–20)
CO2: 30 mmol/L (ref 22–32)
CO2: 29 mmol/L (ref 22–32)
Calcium: 8.7 mg/dL — ABNORMAL LOW (ref 8.9–10.3)
Calcium: 8.1 mg/dL — ABNORMAL LOW (ref 8.9–10.3)
Chloride: 95 mmol/L — ABNORMAL LOW (ref 98–111)
Chloride: 96 mmol/L — ABNORMAL LOW (ref 98–111)
Creatinine, Ser: 1.81 mg/dL — ABNORMAL HIGH (ref 0.61–1.24)
Creatinine, Ser: 1.66 mg/dL — ABNORMAL HIGH (ref 0.61–1.24)
GFR calc Af Amer: 48 mL/min — ABNORMAL LOW (ref >60)
GFR calc Af Amer: 53 mL/min — ABNORMAL LOW (ref >60)
GFR calc non Af Amer: 41 mL/min — ABNORMAL LOW (ref >60)
GFR calc non Af Amer: 46 mL/min — ABNORMAL LOW (ref >60)
Glucose, Bld: 134 mg/dL — ABNORMAL HIGH (ref 70–99)
Glucose, Bld: 147 mg/dL — ABNORMAL HIGH (ref 70–99)
Potassium: 3.7 mmol/L (ref 3.5–5.1)
Potassium: 3.8 mmol/L (ref 3.5–5.1)
Sodium: 138 mmol/L (ref 135–145)
Sodium: 135 mmol/L (ref 135–145)

## 2020-04-11 LAB — COOXEMETRY PANEL
Carboxyhemoglobin: 2.3 % — ABNORMAL HIGH (ref 0.5–1.5)
Carboxyhemoglobin: 2.1 % — ABNORMAL HIGH (ref 0.5–1.5)
Carboxyhemoglobin: 1.9 % — ABNORMAL HIGH (ref 0.5–1.5)
Methemoglobin: 1.2 % (ref 0.0–1.5)
Methemoglobin: 1.1 % (ref 0.0–1.5)
Methemoglobin: 1.3 % (ref 0.0–1.5)
O2 Saturation: 97.8 %
O2 Saturation: 66.1 %
O2 Saturation: 71.6 %
Total hemoglobin: 14.4 g/dL (ref 12.0–16.0)
Total hemoglobin: 13.9 g/dL (ref 12.0–16.0)
Total hemoglobin: 13.6 g/dL (ref 12.0–16.0)

## 2020-04-11 LAB — CBC
HCT: 40.6 % (ref 39.0–52.0)
Hemoglobin: 13.4 g/dL (ref 13.0–17.0)
MCH: 27.5 pg (ref 26.0–34.0)
MCHC: 33 g/dL (ref 30.0–36.0)
MCV: 83.4 fL (ref 80.0–100.0)
Platelets: 226 10*3/uL (ref 150–400)
RBC: 4.87 MIL/uL (ref 4.22–5.81)
RDW: 18.9 % — ABNORMAL HIGH (ref 11.5–15.5)
WBC: 10.2 10*3/uL (ref 4.0–10.5)
nRBC: 0 % (ref 0.0–0.2)

## 2020-04-11 LAB — TYPE AND SCREEN
ABO/RH(D): B POS
Antibody Screen: NEGATIVE

## 2020-04-11 LAB — HEPATIC FUNCTION PANEL
ALT: 198 U/L — ABNORMAL HIGH (ref 0–44)
AST: 67 U/L — ABNORMAL HIGH (ref 15–41)
Albumin: 2.8 g/dL — ABNORMAL LOW (ref 3.5–5.0)
Alkaline Phosphatase: 115 U/L (ref 38–126)
Bilirubin, Direct: 1.1 mg/dL — ABNORMAL HIGH (ref 0.0–0.2)
Indirect Bilirubin: 2.6 mg/dL — ABNORMAL HIGH (ref 0.3–0.9)
Total Bilirubin: 3.7 mg/dL — ABNORMAL HIGH (ref 0.3–1.2)
Total Protein: 6.4 g/dL — ABNORMAL LOW (ref 6.5–8.1)

## 2020-04-11 LAB — LACTIC ACID, PLASMA: Lactic Acid, Venous: 1.7 mmol/L (ref 0.5–1.9)

## 2020-04-11 LAB — ABO/RH: ABO/RH(D): B POS

## 2020-04-11 MED ORDER — SPIRONOLACTONE 12.5 MG HALF TABLET
12.5000 mg | ORAL_TABLET | Freq: Every day | ORAL | Status: DC
  Administered 2020-04-11 – 2020-04-13 (×3): 12.5 mg via ORAL
  Filled 2020-04-11 (×3): qty 1

## 2020-04-11 MED ORDER — DIGOXIN 125 MCG PO TABS
0.1250 mg | ORAL_TABLET | Freq: Every day | ORAL | Status: DC
  Administered 2020-04-11 – 2020-04-15 (×5): 0.125 mg via ORAL
  Filled 2020-04-11 (×5): qty 1

## 2020-04-11 MED ORDER — FUROSEMIDE 80 MG PO TABS
80.0000 mg | ORAL_TABLET | Freq: Every day | ORAL | Status: DC
  Administered 2020-04-11 – 2020-04-13 (×3): 80 mg via ORAL
  Filled 2020-04-11 (×2): qty 1
  Filled 2020-04-11: qty 2

## 2020-04-11 NOTE — Progress Notes (Addendum)
NAME:  Tyler Wong, MRN:  [id number], DOB:  [birth year], LOS: 2 ADMISSION DATE:  04/09/2020, CONSULTATION DATE:  04/09/20 REFERRING MD:  Dollene Cleveland MD, CHIEF COMPLAINT: Symptomatic anemia, CHF  Brief History   54 year old with history of coronary artery disease, CHF [EF 25%], cocaine use, COPD Admitted with shortness of breath, abdominal pain.  Initailly admitted w/ working dx of symptomatic anemia (hgb 3.6) received 1 unit of blood, CBC follow-up was obtained in between transfusions yielding hemoglobin of 13.4, so second transfusion discontinued. Since, has been treated for A/C HFrEF in the setting of medication nonadherance and cocaine use.  Past Medical History    has a past medical history of CAD in native artery (07/17/14), CHF (congestive heart failure) ([place]), Cocaine abuse ([place]), COPD (chronic obstructive pulmonary disease) ([place]), Dyspnea, Hypertension, ST elevation myocardial infarction (STEMI) involving left anterior descending (LAD) coronary artery with complication ([place]), and Tobacco abuse (07/17/2014).  Significant Hospital Events   4/13: Admit w/ CC abd pain and SOB. Hgb 3.6 initially (lab error), repeat after 1U pRBCs 13.4. Since, treat for A/C HFrEF  Consults:  Cards >> HF    Procedures:  CTA chest abdomen pelvis 04/09/2020-cardiac enlargement, coronary artery calcification, mild emphysema with pulmonary scarring.  No acute abdominal process.  Significant Diagnostic Tests:  Echocardiogram 04/10/20: IMPRESSIONS   1. Left ventricular ejection fraction, by estimation, is <20%. The left ventricle has severely decreased function. The left ventricle demonstrates global hypokinesis. The left ventricular internal cavity size was moderately dilated. There is mild left ventricular hypertrophy. Left ventricular diastolic parameters are  consistent with Grade II diastolic dysfunction (pseudonormalization).  2. Right ventricular systolic function is mildly reduced. The right ventricular  size is moderately enlarged. There is moderately elevated pulmonary artery systolic pressure.  3. Left atrial size was moderately dilated.  4. Right atrial size was moderately dilated.  5. The mitral valve is normal in structure. Moderate mitral valve regurgitation. No evidence of mitral stenosis.  6. Tricuspid valve regurgitation is severe.  7. The aortic valve is normal in structure. Aortic valve regurgitation is not visualized. No aortic stenosis is present.   Micro Data:    Antimicrobials:    Interim history/subjective:  No issues overnight. Reports not taking medications consistently and out of diuretic for about a week PTA. Responding well to Milrinone and Lasix with UOP of 6.8L and net -5.6L.  Objective   Blood pressure 109/74, pulse 99, temperature 97.8 F (36.6 C), temperature source Oral, resp. rate (!) 21, height 5\' 10"  (1.778 m), weight 73.9 kg, SpO2 96 %. CVP:  [6 mmHg-20 mmHg] 7 mmHg      Intake/Output Summary (Last 24 hours) at 04/11/2020 1245 Last data filed at 04/11/2020 1200 Gross per 24 hour  Intake 1839.22 ml  Output 7525 ml  Net -5685.78 ml   Filed Weights   04/09/20 0609 04/09/20 1251 04/11/20 0500  Weight: 79.8 kg 78 kg 73.9 kg    Examination:  General: Pleasant, Resting comfortably in bed HEENT: normocephalic atraumatic no jugular venous distention appreciated Cardiac: RRR, Systolic Murmur Pulmonary: CTAB Abdomen: Non-tender, positive bowel sounds Extremities: Warm and dry with brisk capillary refill Neuro: Baseline, Alert and oriented  Resolved Hospital Problem list   Anemia: This was a lab error  Abd pain: Suspect 2/2 congestive hepatopathy  Assessment & Plan:   CAD Acute on chronic systolic and diastolic heart failure: 2/2 medication nonadherance and cocaine use. CVP improved to 6 this AM. - Cardiology and Now HF following - S/P Lasix 160mg   BID with massive diuresis, now appears near euvolemic - Further Diuresis per HF - On  milrinone, wean per HF - Continue ASA, Lipitor  - CO-OX improved 47 >> 66 - Abd pain 2/2 congestive hepatopathy resolved, LFTs downtrending - Stable for transfer to Hospitalist service and Step Down  Acute on chronic renal failure: In setting of low output heart failure. - Improving; 2/05 >> 1.81 - Baseline 1.3 - 1.6 - Avoid nephrotoxins - Follow renal function and E-lytes  Cocaine, Tobacco use - Counseled on cessation and the risk of death with continued cocaine use in setting of his heart failure  Best practice:  Diet: HH  Pain/Anxiety/Delirium protocol (if indicated): NA VAP protocol (if indicated): NA DVT prophylaxis: Heparin GI prophylaxis: PPI Glucose control: Monitor Mobility: Bed Code Status: Full Family Communication: patient updated Disposition: Transfer to Step Down and Hospitalist Service  Pearson Grippe, DO IM PGY-3 Pager: [phone]

## 2020-04-11 NOTE — Consult Note (Addendum)
Advanced Heart Failure Team Consult Note   Primary Physician: [name], NP PCP-Cardiologist:  Shelva Majestic, MD  HF MD: Dr Aundra Dubin   Reason for Consultation: Cardiogenic Shock   HPI:    Tyler Wong is seen today for evaluation of cardiogenic shock at the request of Dr Marisue Ivan.   Tyler Wong is a 54 year old with a history of CAD, DES to LAD in 2015, HTN, cocaine, and chronic systolic heart failure.   Admitted 10/02/18 with increased shortness of breathand chest pain. Underwent RHC/LHC with significant CAD but no target. Possible that low EF is due to cocaine abuse. HF medications started.   In the past he was followed in the HF clinic and HF Paramedicine. He was discharged from HF Paramedicine July 2020 because due to lack of contact.   On 04/01/20 he presented to ED with increased shortness of breath in the setting of a/c systolic heart failure. Given IV lasix and left without being discharged.   Ran out of lasix 6 days prior to admit. Says he was taking extra lasix and was unable to get a refill. Using cocaine when he can get it.   On 04/09/20 he presented to [facility] with increased shortness of breath and abdominal pain. Ran out of lasix 6 days prior to admit. Had hgb 3.6 but this was a lab error. CXR no acute findings. CTA- No disection, emphysema, small r sided pleural effusion, and advanced 3 vessel coronary calcification.  Pertinent admission labs: SARS 2 negative, BNP 1195, WBC 11, HS Trop 173>124, HIV NR, lactic acid 4, and UDS + cocaine.    Yesterday transferred to [facility] for HF Team consultation. Had low CO-OX and hypotension. Started milrinone 0.25 mcg started. Diuresing with IV lasix and now CVP down to 7. Weight has gone down 14 pounds.   Lactic Acid trending down 5.4> 3.9>1.7   Feeling better.   Echo 2021 < Ef 20%  RV mildly reduced. Grade IIDD.  Echo 2019 EF 20-25%  Echo 2015 EF 40-45%   Review of Systems: [y] = yes, [ ]  = no   . General: Weight gain [ ] ; Weight  loss [ ] ; Anorexia [ ] ; Fatigue [ Y]; Fever [ ] ; Chills [ ] ; Weakness [ ]   . Cardiac: Chest pain/pressure [ ] ; Resting SOB [ ] ; Exertional SOB [ Y]; Orthopnea [Y ]; Pedal Edema [ ] ; Palpitations [ ] ; Syncope [ ] ; Presyncope [ ] ; Paroxysmal nocturnal dyspnea[ ]   . Pulmonary: Cough [ ] ; Wheezing[ ] ; Hemoptysis[ ] ; Sputum [ ] ; Snoring [ ]   . GI: Vomiting[ ] ; Dysphagia[ ] ; Melena[ ] ; Hematochezia [ ] ; Heartburn[ ] ; Abdominal pain [ ] ; Constipation [ ] ; Diarrhea [ ] ; BRBPR [ ]   . GU: Hematuria[ ] ; Dysuria [ ] ; Nocturia[ ]   . Vascular: Pain in legs with walking [ ] ; Pain in feet with lying flat [ ] ; Non-healing sores [ ] ; Stroke [ ] ; TIA [ ] ; Slurred speech [ ] ;  . Neuro: Headaches[ ] ; Vertigo[ ] ; Seizures[ ] ; Paresthesias[ ] ;Blurred vision [ ] ; Diplopia [ ] ; Vision changes [ ]   . Ortho/Skin: Arthritis [ ] ; Joint pain [Y ]; Muscle pain [ ] ; Joint swelling [ ] ; Back Pain [ Y]; Rash [ ]   . Psych: Depression[ ] ; Anxiety[ ]   . Heme: Bleeding problems [ ] ; Clotting disorders [ ] ; Anemia [ ]   . Endocrine: Diabetes [ ] ; Thyroid dysfunction[ ]   Home Medications Prior to Admission medications   Medication Sig Start Date End Date  Taking? Authorizing Provider  albuterol (VENTOLIN HFA) 108 (90 Base) MCG/ACT inhaler Inhale 2 puffs into the lungs every 4 (four) hours as needed for wheezing or shortness of breath. 01/24/20  Yes [name], NP  aspirin 81 MG EC tablet Take 1 tablet (81 mg total) by mouth daily. 10/06/18  Yes Freeman Caldron M, PA-C  carvedilol (COREG) 6.25 MG tablet Take 1 tablet (6.25 mg total) by mouth 2 (two) times daily with a meal. 01/30/20 04/29/20 Yes [name], MD  docusate sodium (COLACE) 100 MG capsule Take 100 mg by mouth daily as needed for mild constipation.   Yes [provider]  furosemide (LASIX) 40 MG tablet Take 40 mg by mouth daily.   Yes [provider]  Multiple Vitamin (MULTIVITAMIN) tablet Take 1 tablet by mouth daily.   Yes [provider]    NITROSTAT 0.4 MG SL tablet PLACE 1 TABLET UNDER TONGUE AS NEEDED FOR CHEST PAIN EVERY 5 MINUTES X 3 MAX DOSES.  CALL 911 IF PAIN PERSISTS Patient taking differently: Place 0.4 mg under the tongue every 5 (five) minutes as needed for chest pain.  02/10/18  Yes [name], MD  omeprazole (PRILOSEC) 20 MG capsule Take 1 capsule (20 mg total) by mouth daily. Patient taking differently: Take 20 mg by mouth daily as needed (reflux/heartburn).  01/24/20  Yes Edwards, Milford Cage, NP  sacubitril-valsartan (ENTRESTO) 24-26 MG Take 1 tablet by mouth 2 (two) times daily. 03/28/19  Yes Georgiana Shore, NP  spironolactone (ALDACTONE) 25 MG tablet Take 0.5 tablets (12.5 mg total) by mouth daily. 01/30/20 04/09/20 Yes [name], MD  atorvastatin (LIPITOR) 80 MG tablet Take 1 tablet (80 mg total) by mouth daily. Patient not taking: Reported on 04/09/2020 03/28/19   Georgiana Shore, NP  digoxin (LANOXIN) 0.125 MG tablet Take 0.5 tablets (0.0625 mg total) by mouth daily. Patient not taking: Reported on 04/09/2020 01/30/20   [name], MD    Past Medical History: Past Medical History:  Diagnosis Date  . CAD in native artery 07/17/14   STEMI- LAD stenosis with DES  . CHF (congestive heart failure) ([place])   . Cocaine abuse ([place])   . COPD (chronic obstructive pulmonary disease) ([place])   . Dyspnea   . Hypertension   . ST elevation myocardial infarction (STEMI) involving left anterior descending (LAD) coronary artery with complication ([place])   . Tobacco abuse 07/17/2014    Past Surgical History: Past Surgical History:  Procedure Laterality Date  . CORONARY ANGIOPLASTY WITH STENT PLACEMENT  07/18/14   resolute DES to LAD STEMI  . LEFT HEART CATH N/A 07/17/2014   Procedure: LEFT HEART CATH;  Surgeon: [name], MD;  Location: [facility] CATH LAB;  Service: Cardiovascular;  Laterality: N/A;  . PERCUTANEOUS CORONARY STENT INTERVENTION (PCI-S)  07/17/2014   Procedure: PERCUTANEOUS CORONARY STENT INTERVENTION  (PCI-S);  Surgeon: [name], MD;  Location: [facility] CATH LAB;  Service: Cardiovascular;;  . RIGHT/LEFT HEART CATH AND CORONARY ANGIOGRAPHY N/A 10/03/2018   Procedure: RIGHT/LEFT HEART CATH AND CORONARY ANGIOGRAPHY;  Surgeon: [name], MD;  Location: [facility] CV LAB;  Service: Cardiovascular;  Laterality: N/A;    Family History: Family History  Problem Relation Age of Onset  . Cirrhosis Father   . Heart attack Mother   . Heart attack Sister   . Heart failure Sister   . Heart attack Brother     Social History: Social History   Socioeconomic History  .  Marital status: Single    Spouse name: Not on file  . Number of children: 2  . Years of education: 62  . Highest education level: 12th grade  Occupational History  . Occupation: Landscaping  Tobacco Use  . Smoking status: Current Every Day Smoker    Packs/day: 0.50    Years: 30.00    Pack years: 15.00    Types: Cigarettes  . Smokeless tobacco: Never Used  . Tobacco comment: .5 PK PER DAY  Substance and Sexual Activity  . Alcohol use: No    Comment: Patient denies abuse, states social drinker  . Drug use: Not Currently    Types: Cocaine    Comment: heroin  . Sexual activity: Yes  Other Topics Concern  . Not on file  Social History Narrative   Lives in [place] with his sister and girlfriend. He works Aeronautical engineer work.    Social Determinants of Health   Financial Resource Strain:   . Difficulty of Paying Living Expenses:   Food Insecurity:   . Worried About Charity fundraiser in the Last Year:   . Arboriculturist in the Last Year:   Transportation Needs:   . Film/video editor (Medical):   Marland Kitchen Lack of Transportation (Non-Medical):   Physical Activity:   . Days of Exercise per Week:   . Minutes of Exercise per Session:   Stress:   . Feeling of Stress :   Social Connections:   . Frequency of Communication with Friends and Family:   . Frequency of Social Gatherings with Friends and  Family:   . Attends Religious Services:   . Active Member of Clubs or Organizations:   . Attends Archivist Meetings:   Marland Kitchen Marital Status:     Allergies:  No Known Allergies  Objective:    Vital Signs:   Temp:  [97.6 F (36.4 C)-97.9 F (36.6 C)] 97.9 F (36.6 C) (04/15 0830) Pulse Rate:  [41-129] 95 (04/15 0930) Resp:  [11-31] 18 (04/15 0930) BP: (84-155)/(49-143) 86/61 (04/15 0930) SpO2:  [58 %-100 %] 95 % (04/15 0930) Weight:  [73.9 kg] 73.9 kg (04/15 0500) Last BM Date: 04/09/20  Weight change: Filed Weights   04/09/20 0609 04/09/20 1251 04/11/20 0500  Weight: 79.8 kg 78 kg 73.9 kg    Intake/Output:   Intake/Output Summary (Last 24 hours) at 04/11/2020 0946 Last data filed at 04/11/2020 0800 Gross per 24 hour  Intake 1208.01 ml  Output 7275 ml  Net -6066.99 ml      Physical Exam   CVP personally checked 7  General:  No resp difficulty HEENT: normal Neck: supple. JVP 7-8  . Carotids 2+ bilat; no bruits. No lymphadenopathy or thyromegaly appreciated. Cor: PMI nondisplaced. Regular rate & rhythm. No rubs, or murmurs. +S3  Lungs: clear Abdomen: soft, nontender, nondistended. No hepatosplenomegaly. No bruits or masses. Good bowel sounds. Extremities: no cyanosis, clubbing, rash, edema Neuro: alert & orientedx3, cranial nerves grossly intact. moves all 4 extremities w/o difficulty. Affect pleasant   Telemetry   ST 100s   EKG   ST 106 bpm   Labs   Basic Metabolic Panel: Recent Labs  Lab 04/09/20 1342 04/09/20 1342 04/10/20 0611 04/10/20 0611 04/10/20 1027 04/10/20 1619 04/11/20 0500  NA 139  --  136  --  139 139 138  K 4.5  --  6.0*  --  6.1* 4.4 3.7  CL 100  --  103  --  101 100 95*  CO2 26  --  18*  --  21* 27 30  GLUCOSE 114*  --  105*  --  94 154* 134*  BUN 41*  --  48*  --  49* 48* 38*  CREATININE 1.76*  --  1.94*  --  2.05* 2.05* 1.81*  CALCIUM 9.8   < > 8.6*   < > 9.1 9.0 8.7*  MG  --   --  2.1  --   --   --   --    < > =  values in this interval not displayed.    Liver Function Tests: Recent Labs  Lab 04/09/20 1342 04/11/20 0500  AST 92* 67*  ALT 321* 198*  ALKPHOS 154* 115  BILITOT 2.3* 3.7*  PROT 9.0* 6.4*  ALBUMIN 4.0 2.8*   Recent Labs  Lab 04/09/20 1342 04/09/20 1745  LIPASE 178*  --   AMYLASE  --  152*   No results for input(s): AMMONIA in the last 168 hours.  CBC: Recent Labs  Lab 04/09/20 1209 04/09/20 1745 04/09/20 2108 04/10/20 0611 04/11/20 0500  WBC 2.9* 11.1*  --  11.3* 10.2  NEUTROABS 2.3  --   --  8.7*  --   HGB 3.6* 13.4 15.6 14.2 13.4  HCT 12.1* 42.3 46.1 43.0 40.6  MCV 91.0 87.8  --  85.3 83.4  PLT 253 232  --  233 226    Cardiac Enzymes: No results for input(s): CKTOTAL, CKMB, CKMBINDEX, TROPONINI in the last 168 hours.  BNP: BNP (last 3 results) Recent Labs    05/04/19 1006 04/01/20 0629 04/09/20 1209  BNP 1,206.6* 773.7* 1,195.9*    ProBNP (last 3 results) Recent Labs    01/30/20 1645  PROBNP 1,759*     CBG: Recent Labs  Lab 04/10/20 1647  GLUCAP 118*    Coagulation Studies: No results for input(s): LABPROT, INR in the last 72 hours.   Imaging   ECHOCARDIOGRAM COMPLETE  Result Date: 04/10/2020    ECHOCARDIOGRAM REPORT   Patient Name:   Tyler Wong Date of Exam: 04/10/2020 Medical Rec #:  [id number]   Height:       70.0 in Accession #:    [id number]  Weight:       172.0 lb Date of Birth:  1966-04-20  BSA:          1.958 m Patient Age:    32 years    BP:           120/101 mmHg Patient Gender: M           HR:           108 bpm. Exam Location:  Inpatient Procedure: 2D Echo and 3D Echo STAT ECHO Indications:    Dyspnea 786.09 / R06.00  History:        Patient has prior history of Echocardiogram examinations, most                 recent 02/18/2020. CHF, CAD and Previous Myocardial Infarction,                 COPD; Risk Factors:Hypertension, Dyslipidemia and Current                 Smoker. Cocaine abuse.  Sonographer:    Darlina Sicilian RDCS  Referring Phys: [id number] [place]  1. Left ventricular ejection fraction, by estimation, is <20%. The left ventricle has severely decreased function. The left ventricle demonstrates global hypokinesis. The left ventricular internal  cavity size was moderately dilated. There is mild left ventricular hypertrophy. Left ventricular diastolic parameters are consistent with Grade II diastolic dysfunction (pseudonormalization).  2. Right ventricular systolic function is mildly reduced. The right ventricular size is moderately enlarged. There is moderately elevated pulmonary artery systolic pressure.  3. Left atrial size was moderately dilated.  4. Right atrial size was moderately dilated.  5. The mitral valve is normal in structure. Moderate mitral valve regurgitation. No evidence of mitral stenosis.  6. Tricuspid valve regurgitation is severe.  7. The aortic valve is normal in structure. Aortic valve regurgitation is not visualized. No aortic stenosis is present. FINDINGS  Left Ventricle: Left ventricular ejection fraction, by estimation, is <20%. The left ventricle has severely decreased function. The left ventricle demonstrates global hypokinesis. The left ventricular internal cavity size was moderately dilated. There is mild left ventricular hypertrophy. Left ventricular diastolic parameters are consistent with Grade II diastolic dysfunction (pseudonormalization). Right Ventricle: The right ventricular size is moderately enlarged. No increase in right ventricular wall thickness. Right ventricular systolic function is mildly reduced. There is moderately elevated pulmonary artery systolic pressure. The tricuspid regurgitant velocity is 2.92 m/s, and with an assumed right atrial pressure of 15 mmHg, the estimated right ventricular systolic pressure is 123456 mmHg. Left Atrium: Left atrial size was moderately dilated. Right Atrium: Right atrial size was moderately dilated. Pericardium: Trivial pericardial  effusion is present. Mitral Valve: The mitral valve is normal in structure. Moderate mitral valve regurgitation. No evidence of mitral valve stenosis. Tricuspid Valve: The tricuspid valve is normal in structure. Tricuspid valve regurgitation is severe. No evidence of tricuspid stenosis. Aortic Valve: The aortic valve is normal in structure. Aortic valve regurgitation is not visualized. No aortic stenosis is present. Pulmonic Valve: The pulmonic valve was normal in structure. Pulmonic valve regurgitation is trivial. No evidence of pulmonic stenosis. Aorta: The aortic root and ascending aorta are structurally normal, with no evidence of dilitation. IAS/Shunts: The atrial septum is grossly normal.  LEFT VENTRICLE PLAX 2D LVOT diam:     2.00 cm      Diastology LV SV:         26           LV e' lateral:   7.87 cm/s LV SV Index:   14           LV E/e' lateral: 15.1 LVOT Area:     3.14 cm     LV e' medial:    3.89 cm/s                             LV E/e' medial:  30.6  LV Volumes (MOD) LV vol d, MOD A2C: 260.0 ml LV vol d, MOD A4C: 262.0 ml LV vol s, MOD A2C: 210.0 ml LV vol s, MOD A4C: 224.0 ml LV SV MOD A2C:     50.0 ml LV SV MOD A4C:     262.0 ml LV SV MOD BP:      41.0 ml RIGHT VENTRICLE         IVC TAPSE (M-mode): 1.6 cm  IVC diam: 2.90 cm LEFT ATRIUM              Index       RIGHT ATRIUM           Index LA Vol (A2C):   102.0 ml 52.10 ml/m RA Area:     31.30 cm LA Vol (A4C):   62.4  ml  31.87 ml/m RA Volume:   117.00 ml 59.76 ml/m LA Biplane Vol: 83.6 ml  42.70 ml/m  AORTIC VALVE LVOT Vmax:   67.50 cm/s LVOT Vmean:  45.500 cm/s LVOT VTI:    0.084 m  AORTA Ao Root diam: 2.90 cm MITRAL VALVE                 TRICUSPID VALVE MV Area (PHT): 7.99 cm      TR Peak grad:   34.1 mmHg MV Decel Time: 95 msec       TR Vmax:        292.00 cm/s Tyler Peak grad:    75.0 mmHg Tyler Mean grad:    48.0 mmHg   SHUNTS Tyler Vmax:         433.00 cm/s Systemic VTI:  0.08 m Tyler Vmean:        322.0 cm/s  Systemic Diam: 2.00 cm Tyler PISA:          1.57 cm Tyler PISA Eff ROA: 15 mm Tyler PISA Radius:  0.50 cm MV E velocity: 119.00 cm/s MV A velocity: 105.00 cm/s MV E/A ratio:  1.13 Mertie Moores MD Electronically signed by Mertie Moores MD Signature Date/Time: 04/10/2020/12:54:30 PM    Final    [place] EKG SITE RITE  Result Date: 04/10/2020 If Site Rite image not attached, placement could not be confirmed due to current cardiac rhythm.  US Abdomen Limited RUQ  Result Date: 04/11/2020 CLINICAL DATA:  Elevated liver function tests EXAM: ULTRASOUND ABDOMEN LIMITED RIGHT UPPER QUADRANT COMPARISON:  CT from 2 days ago FINDINGS: Gallbladder: No gallstones or wall thickening visualized. No sonographic Murphy sign noted by sonographer. Common bile duct: Diameter: 5 mm. Liver: Coarse shadowing calcification in the right lobe liver. Portal vein is patent on color Doppler imaging with normal direction of blood flow towards the liver. Other: Distended appearance of the IVC and hepatic veins. 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 Electronically Signed   By: Monte Fantasia M.D.   On: 04/11/2020 06:00      Medications:     Current Medications: . aspirin EC  81 mg Oral Daily  . atorvastatin  80 mg Oral Daily  . Chlorhexidine Gluconate Cloth  6 each Topical Daily  . heparin injection (subcutaneous)  5,000 Units Subcutaneous Q8H  . pantoprazole  40 mg Oral Daily  . sodium chloride flush  10-40 mL Intracatheter Q12H  . sodium chloride flush  3 mL Intravenous Once     Infusions: . milrinone 0.25 mcg/kg/min (04/11/20 0940)       Assessment/Plan   1. Cardiogenic Shock / Acute on Chronic Systolic HF - ECHO [id number] this admit < 20%.  -Lactic Acid 5.4 >1.7 today.  - LFTs elevated on admit in the setting of shock - Initial CO-OX 37%. Start milrinone 0.25 mcg on 04/10/20 . Todays CO-OX is 66%.  - Cut back milrinone to 0.125 mcg and repeat CO-OX  this afternoon.  - CVP down to 7. Stop IV lasix and start lasix 80 mg po daily.  - Add digoxin 0.125 mg daily  - No bb for now. - No Arb for now - Not a candidate for home inotropes with ongoing cocaine abuse.   2. CAD LHC 2015 DES to LAD - Hs Trop 173>124 . No trend - Continue asa and statin.   3. CKD Stage IIIb  -Creatinine 1.8. - Stopping  IV lasix today.    4.Substance Abuse UDS + for cocaine. - Consult SW for substance abuse.   5. Tobacco Abuse Discussed cessation.     He is not a candidate for advanced therapies with ongoing substance abuse. Will consult Palliative Care for Goals of Care.   Consult HFSW for HF Paramedicine at d/c  Medication concerns reviewed with patient and pharmacy team. Barriers identified: Yes.  Will need all meds prior to discharge.   Length of Stay: 2  Darrick Grinder, NP  04/11/2020, 9:46 AM  Advanced Heart Failure Team Pager [phone] (M-F; 7a - 4p)  Please contact [facility] for night-coverage after hours (4p -7a ) and weekends on [email or web address]  Patient seen with NP, agree with the above note.   He was admitted with worsening dyspnea and chest pain.  HS-TnI mildly elevated with no trend.  He had been out of Lasix for about a week, questionable compliance with other meds.  He was positive for cocaine on UDS.  Lactate elevated, cardiogenic shock yesterday.  Milrinone started with marked improvement.  Co-ox 72% today.  Excellent diuresis with weight down and CVP 7.   General: NAD Neck: JVP 7-8 cm, no thyromegaly or thyroid nodule.  Lungs: Clear to auscultation bilaterally with normal respiratory effort. CV: Lateral PMI.  Heart regular S1/S2, no S3/S4, 3/6 HSM apex.  No peripheral edema.   Abdomen: Soft, nontender, no hepatosplenomegaly, no distention.  Skin: Intact without lesions or rashes.  Neurologic: Alert and oriented x 3.  Psych: Normal affect. Extremities: No clubbing or cyanosis.  HEENT: Normal.   1. Acute on chronic systolic CHF:  Echo in [id number] with EF 40-45%, thought to be ischemic cardiomyopathy.  Now with EF down to <20% with mild RV dysfunction.  Cath in 10/19 showed 90% ostial OM1 and 70% PLV, no interventional target.  Suspect mixed ischemic/nonischemic cardiomyopathy.  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  Cocaine likely plays a role in his cardiomyopathy (actively using).  Cardiogenic shock yesterday, improved now on milrinone. CVP 7 and co-ox 72%.   - Not a candidate for advanced therapies or home milrinone with active use of cocaine.   - Can wean milrinone to 0.125 mcg/kg/min and add digoxin 0.125 daily.  - Add spironolactone 12.5 daily.  - With improved volume status, will transition to po Lasix 80 mg daily.  - Entresto versus losartan tomorrow depending on BP and creatinine.   2. CAD: H/o anterior STEMI with DES to LAD in 7/15. As above, cath in 10/19 with 90% ostial OM1 and 70% PLV, no interventional target. Mildly elevated HS-TnI with no trend at admission, suspect demand ischemia from volume overload.  - Continue ASA 81 and atorvastatin 80 mg daily.  3. Cocaine abuse: Counseled to quit.  4. Active smoker: Counseled to quit.  5. AKI: Creatinine trending down, 1.8 currently.  Suspect cardiorenal syndrome.   Loralie Champagne 04/11/2020 4:14 PM

## 2020-04-11 NOTE — Progress Notes (Signed)
Report given to Gowen and 2MW charge April RN. To be tx w/ April.

## 2020-04-11 NOTE — Progress Notes (Signed)
Cardiology Progress Note  Patient ID: Tyler Wong MRN: [id number] DOB: 10-22-1966 Date of Encounter: 04/11/2020  Primary Cardiologist: Shelva Majestic, MD  Subjective  MVO2 37->66. Net negative 5.6 L. Cr improving CVP 7. Feels better. Bps marginal with narrow pulse pressure. Feels better today.   ROS:  All other ROS reviewed and negative. Pertinent positives noted in the HPI.     Inpatient Medications  Scheduled Meds: . aspirin EC  81 mg Oral Daily  . atorvastatin  80 mg Oral Daily  . Chlorhexidine Gluconate Cloth  6 each Topical Daily  . heparin injection (subcutaneous)  5,000 Units Subcutaneous Q8H  . pantoprazole  40 mg Oral Daily  . sodium chloride flush  10-40 mL Intracatheter Q12H  . sodium chloride flush  3 mL Intravenous Once   Continuous Infusions: . milrinone 0.25 mcg/kg/min (04/11/20 0800)   PRN Meds: sodium chloride flush   Vital Signs   Vitals:   04/11/20 0645 04/11/20 0700 04/11/20 0730 04/11/20 0750  BP: (!) 112/58 117/60 94/81 123/68  Pulse: (!) 43 (!) 43 83 95  Resp: (!) 23 17 20 15   Temp:      TempSrc:      SpO2: 95% 96% 98% 100%  Weight:      Height:        Intake/Output Summary (Last 24 hours) at 04/11/2020 0857 Last data filed at 04/11/2020 0800 Gross per 24 hour  Intake 1208.01 ml  Output 7275 ml  Net -6066.99 ml   Last 3 Weights 04/11/2020 04/09/2020 04/09/2020  Weight (lbs) 162 lb 14.7 oz 172 lb 176 lb  Weight (kg) 73.9 kg 78.019 kg 79.833 kg      Telemetry  Overnight telemetry shows NSR 90s, PVCs, which I personally reviewed.   ECG  The most recent ECG shows Sinus tachycardia 106 bpm, biatrial enlargement, LVH, which I personally reviewed.   Physical Exam   Vitals:   04/11/20 0645 04/11/20 0700 04/11/20 0730 04/11/20 0750  BP: (!) 112/58 117/60 94/81 123/68  Pulse: (!) 43 (!) 43 83 95  Resp: (!) 23 17 20 15   Temp:      TempSrc:      SpO2: 95% 96% 98% 100%  Weight:      Height:         Intake/Output Summary (Last 24 hours) at  04/11/2020 0857 Last data filed at 04/11/2020 0800 Gross per 24 hour  Intake 1208.01 ml  Output 7275 ml  Net -6066.99 ml    Last 3 Weights 04/11/2020 04/09/2020 04/09/2020  Weight (lbs) 162 lb 14.7 oz 172 lb 176 lb  Weight (kg) 73.9 kg 78.019 kg 79.833 kg    Body mass index is 23.38 kg/m.    General: Well nourished, well developed, in no acute distress Head: Atraumatic, normal size  Eyes: PEERLA, EOMI  Neck: Supple, JVD 6-8 cm H20 Endocrine: No thryomegaly Cardiac: Normal S1, S2; RRR; 2/6 HSM Lungs: Clear to auscultation bilaterally, no wheezing, rhonchi or rales  Abd: Soft, nontender, no hepatomegaly  Ext: No edema, pulses 2+ Musculoskeletal: No deformities, BUE and BLE strength normal and equal Skin: Warm and dry, no rashes   Neuro: Alert and oriented to person, place, time, and situation, CNII-XII grossly intact, no focal deficits  Psych: Normal mood and affect   Labs  High Sensitivity Troponin:   Recent Labs  Lab 04/01/20 0427 04/01/20 0629 04/09/20 1745 04/10/20 0035  TROPONINIHS 111* 104* 173* 124*     Cardiac EnzymesNo results for input(s): TROPONINI in the last  168 hours. No results for input(s): TROPIPOC in the last 168 hours.  Chemistry Recent Labs  Lab 04/09/20 1342 04/10/20 0611 04/10/20 1027 04/10/20 1619 04/11/20 0500  NA 139   < > 139 139 138  K 4.5   < > 6.1* 4.4 3.7  CL 100   < > 101 100 95*  CO2 26   < > 21* 27 30  GLUCOSE 114*   < > 94 154* 134*  BUN 41*   < > 49* 48* 38*  CREATININE 1.76*   < > 2.05* 2.05* 1.81*  CALCIUM 9.8   < > 9.1 9.0 8.7*  PROT 9.0*  --   --   --  6.4*  ALBUMIN 4.0  --   --   --  2.8*  AST 92*  --   --   --  67*  ALT 321*  --   --   --  198*  ALKPHOS 154*  --   --   --  115  BILITOT 2.3*  --   --   --  3.7*  GFRNONAA 43*   < > 36* 36* 41*  GFRAA 50*   < > 41* 41* 48*  ANIONGAP 13   < > 17* 12 13   < > = values in this interval not displayed.    Hematology Recent Labs  Lab 04/09/20 1745 04/09/20 1745  04/09/20 2108 04/10/20 0611 04/11/20 0500  WBC 11.1*  --   --  11.3* 10.2  RBC 4.82  --   --  5.04 4.87  HGB 13.4   < > 15.6 14.2 13.4  HCT 42.3   < > 46.1 43.0 40.6  MCV 87.8  --   --  85.3 83.4  MCH 27.8  --   --  28.2 27.5  MCHC 31.7  --   --  33.0 33.0  RDW 19.8*  --   --  19.4* 18.9*  PLT 232  --   --  233 226   < > = values in this interval not displayed.   BNP Recent Labs  Lab 04/09/20 1209  BNP 1,195.9*    DDimer No results for input(s): DDIMER in the last 168 hours.   Radiology  DG CHEST PORT 1 VIEW  Result Date: 04/09/2020 CLINICAL DATA:  Shortness of breath EXAM: PORTABLE CHEST 1 VIEW COMPARISON:  04/09/2020 FINDINGS: Cardiac shadow is enlarged but stable. The lungs are well aerated bilaterally. No focal infiltrate or sizable effusion is seen. Mild areas of scarring in the right upper lobe are seen. No other focal abnormality is noted. IMPRESSION: No acute abnormality noted. Electronically Signed   By: Inez Catalina M.D.   On: 04/09/2020 20:14   DG Chest Port 1 View  Result Date: 04/09/2020 CLINICAL DATA:  Shortness of breath. EXAM: PORTABLE CHEST 1 VIEW COMPARISON:  04/01/2020 FINDINGS: Lungs are adequately inflated without focal airspace consolidation or effusion. Minimal stable scarring right upper lung. Stable cardiomegaly. Remainder the exam is unchanged. IMPRESSION: No acute cardiopulmonary disease. Stable cardiomegaly. Electronically Signed   By: Marin Olp M.D.   On: 04/09/2020 12:54   ECHOCARDIOGRAM COMPLETE  Result Date: 04/10/2020    ECHOCARDIOGRAM REPORT   Patient Name:   Tyler Wong Date of Exam: 04/10/2020 Medical Rec #:  [id number]   Height:       70.0 in Accession #:    [id number]  Weight:       172.0 lb Date of Birth:  04-02-1966  BSA:  1.958 m Patient Age:    54 years    BP:           120/101 mmHg Patient Gender: M           HR:           108 bpm. Exam Location:  Inpatient Procedure: 2D Echo and 3D Echo STAT ECHO Indications:    Dyspnea 786.09 /  R06.00  History:        Patient has prior history of Echocardiogram examinations, most                 recent 02/18/2020. CHF, CAD and Previous Myocardial Infarction,                 COPD; Risk Factors:Hypertension, Dyslipidemia and Current                 Smoker. Cocaine abuse.  Sonographer:    Darlina Sicilian RDCS Referring Phys: [id number] [place]  1. Left ventricular ejection fraction, by estimation, is <20%. The left ventricle has severely decreased function. The left ventricle demonstrates global hypokinesis. The left ventricular internal cavity size was moderately dilated. There is mild left ventricular hypertrophy. Left ventricular diastolic parameters are consistent with Grade II diastolic dysfunction (pseudonormalization).  2. Right ventricular systolic function is mildly reduced. The right ventricular size is moderately enlarged. There is moderately elevated pulmonary artery systolic pressure.  3. Left atrial size was moderately dilated.  4. Right atrial size was moderately dilated.  5. The mitral valve is normal in structure. Moderate mitral valve regurgitation. No evidence of mitral stenosis.  6. Tricuspid valve regurgitation is severe.  7. The aortic valve is normal in structure. Aortic valve regurgitation is not visualized. No aortic stenosis is present. FINDINGS  Left Ventricle: Left ventricular ejection fraction, by estimation, is <20%. The left ventricle has severely decreased function. The left ventricle demonstrates global hypokinesis. The left ventricular internal cavity size was moderately dilated. There is mild left ventricular hypertrophy. Left ventricular diastolic parameters are consistent with Grade II diastolic dysfunction (pseudonormalization). Right Ventricle: The right ventricular size is moderately enlarged. No increase in right ventricular wall thickness. Right ventricular systolic function is mildly reduced. There is moderately elevated pulmonary artery systolic  pressure. The tricuspid regurgitant velocity is 2.92 m/s, and with an assumed right atrial pressure of 15 mmHg, the estimated right ventricular systolic pressure is 123456 mmHg. Left Atrium: Left atrial size was moderately dilated. Right Atrium: Right atrial size was moderately dilated. Pericardium: Trivial pericardial effusion is present. Mitral Valve: The mitral valve is normal in structure. Moderate mitral valve regurgitation. No evidence of mitral valve stenosis. Tricuspid Valve: The tricuspid valve is normal in structure. Tricuspid valve regurgitation is severe. No evidence of tricuspid stenosis. Aortic Valve: The aortic valve is normal in structure. Aortic valve regurgitation is not visualized. No aortic stenosis is present. Pulmonic Valve: The pulmonic valve was normal in structure. Pulmonic valve regurgitation is trivial. No evidence of pulmonic stenosis. Aorta: The aortic root and ascending aorta are structurally normal, with no evidence of dilitation. IAS/Shunts: The atrial septum is grossly normal.  LEFT VENTRICLE PLAX 2D LVOT diam:     2.00 cm      Diastology LV SV:         26           LV e' lateral:   7.87 cm/s LV SV Index:   14           LV E/e'  lateral: 15.1 LVOT Area:     3.14 cm     LV e' medial:    3.89 cm/s                             LV E/e' medial:  30.6  LV Volumes (MOD) LV vol d, MOD A2C: 260.0 ml LV vol d, MOD A4C: 262.0 ml LV vol s, MOD A2C: 210.0 ml LV vol s, MOD A4C: 224.0 ml LV SV MOD A2C:     50.0 ml LV SV MOD A4C:     262.0 ml LV SV MOD BP:      41.0 ml RIGHT VENTRICLE         IVC TAPSE (M-mode): 1.6 cm  IVC diam: 2.90 cm LEFT ATRIUM              Index       RIGHT ATRIUM           Index LA Vol (A2C):   102.0 ml 52.10 ml/m RA Area:     31.30 cm LA Vol (A4C):   62.4 ml  31.87 ml/m RA Volume:   117.00 ml 59.76 ml/m LA Biplane Vol: 83.6 ml  42.70 ml/m  AORTIC VALVE LVOT Vmax:   67.50 cm/s LVOT Vmean:  45.500 cm/s LVOT VTI:    0.084 m  AORTA Ao Root diam: 2.90 cm MITRAL VALVE                  TRICUSPID VALVE MV Area (PHT): 7.99 cm      TR Peak grad:   34.1 mmHg MV Decel Time: 95 msec       TR Vmax:        292.00 cm/s MR Peak grad:    75.0 mmHg MR Mean grad:    48.0 mmHg   SHUNTS MR Vmax:         433.00 cm/s Systemic VTI:  0.08 m MR Vmean:        322.0 cm/s  Systemic Diam: 2.00 cm MR PISA:         1.57 cm MR PISA Eff ROA: 15 mm MR PISA Radius:  0.50 cm MV E velocity: 119.00 cm/s MV A velocity: 105.00 cm/s MV E/A ratio:  1.13 Mertie Moores MD Electronically signed by Mertie Moores MD Signature Date/Time: 04/10/2020/12:54:30 PM    Final    CT Angio Chest/Abd/Pel for Dissection W and/or Wo Contrast  Result Date: 04/09/2020 CLINICAL DATA:  Chest pain, shortness of breath, severe anemia and abdominal pain. History of cocaine abuse. Evaluate for possible dissection or retroperitoneal bleed. EXAM: CT ANGIOGRAPHY CHEST, ABDOMEN AND PELVIS TECHNIQUE: Multidetector CT imaging through the chest, abdomen and pelvis was performed using the standard protocol during bolus administration of intravenous contrast. Multiplanar reconstructed images and MIPs were obtained and reviewed to evaluate the vascular anatomy. CONTRAST:  166mL OMNIPAQUE IOHEXOL 350 MG/ML SOLN COMPARISON:  CT abdomen/pelvis 10/23/2017 and CT angio chest, FINDINGS: CTA CHEST FINDINGS Cardiovascular: Mild to moderate cardiac enlargement. Four-chamber enlargement but most notably the left ventricle. The aorta is normal in caliber. No dissection. The aortic branch vessels are patent. Minimal scattered atherosclerotic calcifications at the aortic arch. Fairly extensive/age advanced three-vessel coronary artery calcifications. Moderate reflux of contrast down the IVC and into dilated hepatic veins suggesting tricuspid regurgitation or some component of right heart failure. Mediastinum/Nodes: No mediastinal or hilar mass or lymphadenopathy. The esophagus is grossly normal. Lungs/Pleura: Stable mild emphysematous changes and  areas of pulmonary  scarring. There are also some areas of mild bronchiectasis. I do not see any worrisome pulmonary lesions or pulmonary nodules. There is a small right-sided pleural effusion with minimal overlying atelectasis. Musculoskeletal: No significant bony findings. Review of the MIP images confirms the above findings. CTA ABDOMEN AND PELVIS FINDINGS VASCULAR Aorta: Normal Celiac: Normal SMA: Normal Renals: Normal IMA: Normal Inflow: Normal Veins: Normal Review of the MIP images confirms the above findings. NON-VASCULAR Hepatobiliary: Severe hepatic congestion findings with reflux of contrast down the IVC, into the dilated hepatic veins and into the adjacent lung parenchyma likely due to heart failure and or tricuspid regurgitation. Small cluster of calcifications in the right hepatic lobe. No worrisome hepatic lesions. No biliary dilatation. The gallbladder is grossly normal. No common bile duct dilatation. Pancreas: No mass, inflammation or ductal dilatation. Spleen: Normal size. No focal lesions. Adrenals/Urinary Tract: The adrenal glands and kidneys are grossly normal. The bladder is unremarkable. Stomach/Bowel: The stomach, duodenum, small bowel and colon are grossly normal without oral contrast. No acute inflammatory changes, mass lesions or obstructive findings. The terminal ileum and appendix are normal. Lymphatic: No mesenteric or retroperitoneal mass, adenopathy or hematoma. Reproductive: The prostate gland and seminal vesicles are unremarkable. Other: Mild mesenteric edema with a small amount of free abdominal/pelvic fluid. There is also body wall edema. Findings suggest anasarca are related to chronic CHF. Musculoskeletal: No significant bony findings. Review of the MIP images confirms the above findings. IMPRESSION: 1. No CT findings for aortic dissection or aneurysm. 2. Four-chamber enlargement but most notably the left ventricle. 3. Age advanced three-vessel coronary artery calcifications. 4. Reflux of contrast  down the IVC and into the dilated hepatic veins and into the adjacent hepatic parenchyma (hepatic congestion) likely due to heart failure and or tricuspid regurgitation. 5. Small right-sided pleural effusion and minimal overlying atelectasis. 6. Mild emphysematous changes and pulmonary scarring. 7. No acute abdominal/pelvic findings, mass lesions or adenopathy. 8. Emphysema and aortic atherosclerosis. Aortic Atherosclerosis (ICD10-I70.0) and Emphysema (ICD10-J43.9). Electronically Signed   By: Marijo Sanes M.D.   On: 04/09/2020 15:28   [place] EKG SITE RITE  Result Date: 04/10/2020 If Site Rite image not attached, placement could not be confirmed due to current cardiac rhythm.  US Abdomen Limited RUQ  Result Date: 04/11/2020 CLINICAL DATA:  Elevated liver function tests EXAM: ULTRASOUND ABDOMEN LIMITED RIGHT UPPER QUADRANT COMPARISON:  CT from 2 days ago FINDINGS: Gallbladder: No gallstones or wall thickening visualized. No sonographic Murphy sign noted by sonographer. Common bile duct: Diameter: 5 mm. Liver: Coarse shadowing calcification in the right lobe liver. Portal vein is patent on color Doppler imaging with normal direction of blood flow towards the liver. Other: Distended appearance of the IVC and hepatic veins. 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 Electronically Signed   By: Monte Fantasia M.D.   On: 04/11/2020 06:00    Cardiac Studies  TTE 04/10/2020 1. Left ventricular ejection fraction, by estimation, is <20%. The left  ventricle has severely decreased function. The left ventricle demonstrates  global hypokinesis. The left ventricular internal cavity size was  moderately dilated. There is mild left  ventricular hypertrophy. Left ventricular diastolic parameters are  consistent with Grade II diastolic dysfunction (pseudonormalization).  2. Right ventricular systolic function is  mildly reduced. The right  ventricular size is moderately enlarged. There is moderately elevated  pulmonary artery systolic pressure.  3. Left atrial size  was moderately dilated.  4. Right atrial size was moderately dilated.  5. The mitral valve is normal in structure. Moderate mitral valve  regurgitation. No evidence of mitral stenosis.  6. Tricuspid valve regurgitation is severe.  7. The aortic valve is normal in structure. Aortic valve regurgitation is  not visualized. No aortic stenosis is present.   Patient Profile  Mr. Longton is a 54 year old male with medical history of CAD status post MI with drug-eluting stent to LAD in 2015, hypertension, systolic heart failure ejection fraction 25-30%, cocaine abuse, tobacco abuse who was admitted on 04/09/2020 for worsening shortness of breath consistent with acute decompensated systolic heart failure.  Assessment & Plan   Acute systolic heart failure, ejection fraction 10%, LVOT VTI 8.4 cm, lactic acidosis, acute kidney injury, acute liver injury, cardiogenic shock  -admitted with AKI, acute liver injury, volume overload, lactic acidosis concerning for cardiogenic shock -EF 10%, LVOT VTI 8.4 cm on echocardiogram with severe MR and severe TR.  IVC was noted to be around 3 cm.  This continues to cardiac output of 2.6 L/min, 1.3 L/min/m2.  -MVO2 37 -> 66 on milrinone 0.25 mcg/kg/min -Net negative 5.6 L. CVP 7. 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 Has not been taking any meds. Severe MR/TR from volume overload and will need to be assessed after GDMT -lactic acidosis has resolved -I suspect we may be able to wean milrinone but I have asked the advanced heart failure team to weigh in on his case.  -continue to hold BB/CCB -will follow-up after advance hf assessment   For questions or updates, please contact [place] Please consult [email or web address] for contact info under     Time Spent with Patient: I have spent a total of 25 minutes with patient reviewing hospital notes, telemetry, EKGs, labs and examining the patient as well as establishing an assessment and plan that was discussed with the patient.  > 50% of time was spent in direct patient care.    Signed, Addison Naegeli. Audie Box, [place]  04/11/2020 8:57 AM

## 2020-04-12 ENCOUNTER — Inpatient Hospital Stay (HOSPITAL_COMMUNITY): Payer: Medicaid Other

## 2020-04-12 DIAGNOSIS — I5041 Acute combined systolic (congestive) and diastolic (congestive) heart failure: Secondary | ICD-10-CM | POA: Diagnosis not present

## 2020-04-12 DIAGNOSIS — F1490 Cocaine use, unspecified, uncomplicated: Secondary | ICD-10-CM

## 2020-04-12 DIAGNOSIS — Z515 Encounter for palliative care: Secondary | ICD-10-CM

## 2020-04-12 DIAGNOSIS — R570 Cardiogenic shock: Secondary | ICD-10-CM | POA: Diagnosis not present

## 2020-04-12 LAB — HEPATIC FUNCTION PANEL
ALT: 139 U/L — ABNORMAL HIGH (ref 0–44)
AST: 48 U/L — ABNORMAL HIGH (ref 15–41)
Albumin: 2.2 g/dL — ABNORMAL LOW (ref 3.5–5.0)
Alkaline Phosphatase: 99 U/L (ref 38–126)
Bilirubin, Direct: 0.5 mg/dL — ABNORMAL HIGH (ref 0.0–0.2)
Indirect Bilirubin: 0.6 mg/dL (ref 0.3–0.9)
Total Bilirubin: 1.1 mg/dL (ref 0.3–1.2)
Total Protein: 5.3 g/dL — ABNORMAL LOW (ref 6.5–8.1)

## 2020-04-12 LAB — BASIC METABOLIC PANEL
Anion gap: 7 (ref 5–15)
BUN: 32 mg/dL — ABNORMAL HIGH (ref 6–20)
CO2: 30 mmol/L (ref 22–32)
Calcium: 7.9 mg/dL — ABNORMAL LOW (ref 8.9–10.3)
Chloride: 97 mmol/L — ABNORMAL LOW (ref 98–111)
Creatinine, Ser: 1.52 mg/dL — ABNORMAL HIGH (ref 0.61–1.24)
GFR calc Af Amer: 59 mL/min — ABNORMAL LOW (ref >60)
GFR calc non Af Amer: 51 mL/min — ABNORMAL LOW (ref >60)
Glucose, Bld: 162 mg/dL — ABNORMAL HIGH (ref 70–99)
Potassium: 3.8 mmol/L (ref 3.5–5.1)
Sodium: 134 mmol/L — ABNORMAL LOW (ref 135–145)

## 2020-04-12 LAB — MAGNESIUM: Magnesium: 1.5 mg/dL — ABNORMAL LOW (ref 1.7–2.4)

## 2020-04-12 LAB — CBC
HCT: 39.1 % (ref 39.0–52.0)
Hemoglobin: 12.9 g/dL — ABNORMAL LOW (ref 13.0–17.0)
MCH: 27.8 pg (ref 26.0–34.0)
MCHC: 33 g/dL (ref 30.0–36.0)
MCV: 84.3 fL (ref 80.0–100.0)
Platelets: 220 10*3/uL (ref 150–400)
RBC: 4.64 MIL/uL (ref 4.22–5.81)
RDW: 18.7 % — ABNORMAL HIGH (ref 11.5–15.5)
WBC: 9.9 10*3/uL (ref 4.0–10.5)
nRBC: 0 % (ref 0.0–0.2)

## 2020-04-12 LAB — COOXEMETRY PANEL
Carboxyhemoglobin: 1.5 % (ref 0.5–1.5)
Methemoglobin: 1.1 % (ref 0.0–1.5)
O2 Saturation: 61.8 %
Total hemoglobin: 13.4 g/dL (ref 12.0–16.0)

## 2020-04-12 LAB — HEMOGLOBIN A1C
Hgb A1c MFr Bld: 6.5 % — ABNORMAL HIGH (ref 4.8–5.6)
Mean Plasma Glucose: 139.85 mg/dL

## 2020-04-12 MED ORDER — POLYETHYLENE GLYCOL 3350 17 G PO PACK
17.0000 g | PACK | Freq: Every day | ORAL | Status: DC
  Administered 2020-04-12 – 2020-04-15 (×4): 17 g via ORAL
  Filled 2020-04-12 (×4): qty 1

## 2020-04-12 MED ORDER — MAGNESIUM SULFATE 4 GM/100ML IV SOLN
4.0000 g | Freq: Once | INTRAVENOUS | Status: AC
  Administered 2020-04-12: 4 g via INTRAVENOUS
  Filled 2020-04-12: qty 100

## 2020-04-12 MED ORDER — GUAIFENESIN ER 600 MG PO TB12
600.0000 mg | ORAL_TABLET | Freq: Two times a day (BID) | ORAL | Status: DC
  Administered 2020-04-12 (×2): 600 mg via ORAL
  Filled 2020-04-12 (×7): qty 1

## 2020-04-12 MED ORDER — POTASSIUM CHLORIDE CRYS ER 20 MEQ PO TBCR
20.0000 meq | EXTENDED_RELEASE_TABLET | Freq: Once | ORAL | Status: AC
  Administered 2020-04-12: 20 meq via ORAL
  Filled 2020-04-12: qty 1

## 2020-04-12 MED ORDER — LOSARTAN POTASSIUM 25 MG PO TABS
12.5000 mg | ORAL_TABLET | Freq: Every day | ORAL | Status: DC
  Administered 2020-04-12 – 2020-04-13 (×2): 12.5 mg via ORAL
  Filled 2020-04-12 (×2): qty 1

## 2020-04-12 NOTE — Progress Notes (Signed)
PROGRESS NOTE  Tracker Mance WUJ:[id number] DOB: July 30, 1966 DOA: 04/09/2020 PCP: [name], NP  Tyler Wong is a 54 year old with a history of tobacco use, copd, CAD, DES to LAD in 2015, HTN, cocaine use, and chronic systolic heart failure. Admitted for [name] in the setting of poor med compliance and cocaine use (UDS+). Found to have low co-ox and hypotension and started on milrinone.   Echo this admit shows biventricular dysfunction. LVEF down to <20% (previously 40-45%). RV mildly reduce.   Initially presented to [name] is complaining of short of breath ,bloating and one episode of vomiting Initial hemoglobin was 3.6, he received 1 unit PRBC in the ED , critical care consulted for admission CTA chest abdomen pelvis 04/09/2020-cardiac enlargement, coronary artery calcification, mild emphysema with pulmonary scarring.  No acute abdominal process. fobt negative Repeat cbc after one unit of prbc transfusion is 13.4 , so felt initial cbc result was false   HPI/Recap of past 24 hours:  C/o cough with green sputum Denies chest pain, no sob, no edema   Assessment/Plan: Active Problems:   Cocaine use   Acute combined systolic and diastolic congestive heart failure (HCC)   Symptomatic anemia   Cardiogenic shock (HCC)   CKD (chronic kidney disease) stage 3, GFR 30-59 ml/min  Acute on chronic systolic CHF/severe Tyler/cardiomyopathy/cardiogenic shock -plan per Heart failure team  -Treated with miodarone drip which is discontinued today in the pm after lactic acid normalization and improvement in coocx -on dig, spironolactone, losartan , lasix -no ? blocker w/ shock and low BP  -Not a candidate for advanced therapies or home milrinone with active use of cocaine.  -palliative care consulted per cardiology recommendation  Hypokalemia/hyppmagnesemia: Replace k and mag Repeat in am  CAD s/p DES LAD On asa, statin Denies chest pain  AKI on CKD aki likely due to cardiorenal  syndrome Bun/cr 49/2.05 on presentation Improving, today bun/cr is 32/1.5  Elevated lft Likely from liver congestion  initial  cta ab showed "Hepatobiliary: Severe hepatic congestion findings with reflux of contrast down the IVC, into the dilated hepatic veins and into the adjacent lung parenchyma likely due to heart failure and or tricuspid regurgitation." Check hepatitis panel  Impaired fasting blood glucose A.m. blood glucose 162  check A1c  HTN Presented with cardiogenic shock BP improving, off milrinone drip BP stable on lasix/ spironolactone/ losartan  Substance abuse Active cocaine use, cessation education provided  Smoking cessation education provided   DVT Prophylaxis: Subcu heparin  Code Status: Full  Family Communication: patient and wife at bedside with permission  Disposition Plan:    Patient came from:                      Home                                                                                     Anticipated d/c place:  TBD  Barriers to d/c OR conditions which need to be met to effect a safe d/c:  Needs cardiology clearance   Consultants:  Critical care  Cardiology/heart failure team  Palliative care  Procedures:  PRBC transfusion x1 on admission  PICC line placement  Antibiotics:  None   Objective: BP 95/60 (BP Location: Left Leg)   Pulse 97   Temp 98 F (36.7 C) (Oral)   Resp 20   Ht 5' 10"  (1.778 m)   Wt 75.3 kg   SpO2 94%   BMI 23.83 kg/m   Intake/Output Summary (Last 24 hours) at 04/12/2020 1114 Last data filed at 04/12/2020 1039 Gross per 24 hour  Intake 1127.62 ml  Output 700 ml  Net 427.62 ml   Filed Weights   04/11/20 0500 04/11/20 1553 04/12/20 0329  Weight: 73.9 kg 74 kg 75.3 kg    Exam: Patient is examined daily including today on 04/12/2020, exams remain the same as of yesterday except that has changed    General:  NAD  Cardiovascular: RRR  Respiratory: CTABL  Abdomen: Soft/ND/NT,  positive BS  Musculoskeletal: No Edema  Neuro: alert, oriented   Data Reviewed: Basic Metabolic Panel: Recent Labs  Lab 04/10/20 0611 04/10/20 0611 04/10/20 1027 04/10/20 1619 04/11/20 0500 04/11/20 1800 04/12/20 0403  NA 136   < > 139 139 138 135 134*  K 6.0*   < > 6.1* 4.4 3.7 3.8 3.8  CL 103   < > 101 100 95* 96* 97*  CO2 18*   < > 21* 27 30 29 30   GLUCOSE 105*   < > 94 154* 134* 147* 162*  BUN 48*   < > 49* 48* 38* 35* 32*  CREATININE 1.94*   < > 2.05* 2.05* 1.81* 1.66* 1.52*  CALCIUM 8.6*   < > 9.1 9.0 8.7* 8.1* 7.9*  MG 2.1  --   --   --   --   --  1.5*   < > = values in this interval not displayed.   Liver Function Tests: Recent Labs  Lab 04/09/20 1342 04/11/20 0500 04/12/20 0403  AST 92* 67* 48*  ALT 321* 198* 139*  ALKPHOS 154* 115 99  BILITOT 2.3* 3.7* 1.1  PROT 9.0* 6.4* 5.3*  ALBUMIN 4.0 2.8* 2.2*   Recent Labs  Lab 04/09/20 1342 04/09/20 1745  LIPASE 178*  --   AMYLASE  --  152*   No results for input(s): AMMONIA in the last 168 hours. CBC: Recent Labs  Lab 04/09/20 1209 04/09/20 1209 04/09/20 1745 04/09/20 2108 04/10/20 0611 04/11/20 0500 04/12/20 0403  WBC 2.9*  --  11.1*  --  11.3* 10.2 9.9  NEUTROABS 2.3  --   --   --  8.7*  --   --   HGB 3.6*   < > 13.4 15.6 14.2 13.4 12.9*  HCT 12.1*   < > 42.3 46.1 43.0 40.6 39.1  MCV 91.0  --  87.8  --  85.3 83.4 84.3  PLT 253  --  232  --  233 226 220   < > = values in this interval not displayed.   Cardiac Enzymes:   No results for input(s): CKTOTAL, CKMB, CKMBINDEX, TROPONINI in the last 168 hours. BNP (last 3 results) Recent Labs    05/04/19 1006 04/01/20 0629 04/09/20 1209  BNP 1,206.6* 773.7* 1,195.9*    ProBNP (last 3 results) Recent Labs    01/30/20 1645  PROBNP 1,759*    CBG: Recent Labs  Lab 04/10/20 1647  GLUCAP 118*    Recent Results (from the past 240 hour(s))  MRSA PCR Screening     Status: None   Collection Time: 04/09/20  1:21 AM  Specimen: Nasal  Mucosa; Nasopharyngeal  Result Value Ref Range Status   MRSA by PCR NEGATIVE NEGATIVE Final    Comment:        The GeneXpert MRSA Assay (FDA approved for NASAL specimens only), is one component of a comprehensive MRSA colonization surveillance program. It is not intended to diagnose MRSA infection nor to guide or monitor treatment for MRSA infections. Performed at [facility], [place] [address], [place], [place] [address]   SARS CORONAVIRUS 2 (TAT 6-24 HRS) Nasopharyngeal Nasopharyngeal Swab     Status: None   Collection Time: 04/09/20  5:27 PM   Specimen: Nasopharyngeal Swab  Result Value Ref Range Status   SARS Coronavirus 2 NEGATIVE NEGATIVE Final    Comment: (NOTE) SARS-CoV-2 target nucleic acids are NOT DETECTED. The SARS-CoV-2 RNA is generally detectable in upper and lower respiratory specimens during the acute phase of infection. Negative results do not preclude SARS-CoV-2 infection, do not rule out co-infections with other pathogens, and should not be used as the sole basis for treatment or other patient management decisions. Negative results must be combined with clinical observations, patient history, and epidemiological information. The expected result is Negative. Fact Sheet for Patients: SugarRoll.be Fact Sheet for Healthcare Providers: https://www.woods-mathews.com/ This test is not yet approved or cleared by the [place] FDA and  has been authorized for detection and/or diagnosis of SARS-CoV-2 by FDA under an Emergency Use Authorization (EUA). This EUA will remain  in effect (meaning this test can be used) for the duration of the COVID-19 declaration under Section 56 4(b)(1) of the Act, 21 U.S.C. section 360bbb-3(b)(1), unless the authorization is terminated or revoked sooner. Performed at [facility], [place] [address] [place], [place] [address]      Studies: No results  found.  Scheduled Meds: . aspirin EC  81 mg Oral Daily  . atorvastatin  80 mg Oral Daily  . Chlorhexidine Gluconate Cloth  6 each Topical Daily  . digoxin  0.125 mg Oral Daily  . furosemide  80 mg Oral Daily  . heparin injection (subcutaneous)  5,000 Units Subcutaneous Q8H  . pantoprazole  40 mg Oral Daily  . polyethylene glycol  17 g Oral Daily  . sodium chloride flush  10-40 mL Intracatheter Q12H  . sodium chloride flush  3 mL Intravenous Once  . spironolactone  12.5 mg Oral Daily    Continuous Infusions: . magnesium sulfate bolus IVPB 4 g (04/12/20 1039)  . milrinone 0.125 mcg/kg/min (04/12/20 0400)     Time spent: 81mns I have personally reviewed and interpreted on  04/12/2020 daily labs, tele strips, imagings as discussed above under date review session and assessment and plans.  I reviewed all nursing notes, pharmacy notes, consultant notes,  vitals, pertinent old records  I have discussed plan of care as described above with RN , patient and family on 04/12/2020   FFlorencia ReasonsMD, PhD, FACP  [facility]  Available via Epic secure chat 7am-7pm for nonurgent issues Please page for urgent issues, pager number available through [email or web address] .   04/12/2020, 11:14 AM  LOS: 3 days

## 2020-04-12 NOTE — Progress Notes (Addendum)
Advanced Heart Failure Rounding Note  PCP-Cardiologist: Shelva Majestic, MD     Patient Profile   Tyler Wong is a 54 year old with a history of CAD, DES to LAD in 2015, HTN, cocaine use, and chronic systolic heart failure. Admitted for [name] in the setting of poor med compliance and cocaine use (UDS+). Found to have low co-ox and hypotension and started on milrinone.   Echo this admit shows biventricular dysfunction. LVEF down to <20% (previously 40-45%). RV mildly reduce.   Subjective:    Overall feels better. Dyspnea, orthopnea and PND resolved.   Resting comfortably in bed.   Remains on 0.125 mcg of milrinone, Co-ox 62%.   BP soft in mid 90s. SCr improving, down to 1.5 today (peaked at 2.05)  Volume status ok. CVP 9   Occasional PVCs on tele. No VT.    Objective:   Weight Range: 75.3 kg Body mass index is 23.83 kg/m.   Vital Signs:   Temp:  [97.6 F (36.4 C)-98.3 F (36.8 C)] 98 F (36.7 C) (04/16 0820) Pulse Rate:  [94-103] 97 (04/16 0820) Resp:  [18-21] 20 (04/16 0820) BP: (95-123)/(60-90) 95/60 (04/16 0820) SpO2:  [94 %-100 %] 94 % (04/16 0820) Weight:  [74 kg-75.3 kg] 75.3 kg (04/16 0329) Last BM Date: 04/09/20  Weight change: Filed Weights   04/11/20 0500 04/11/20 1553 04/12/20 0329  Weight: 73.9 kg 74 kg 75.3 kg    Intake/Output:   Intake/Output Summary (Last 24 hours) at 04/12/2020 1251 Last data filed at 04/12/2020 1100 Gross per 24 hour  Intake 1124.69 ml  Output 900 ml  Net 224.69 ml      Physical Exam    CVP 9 General:  Well appearing, thin AAM. No resp difficulty HEENT: Normal Neck: Supple. JVP 8 cm . Carotids 2+ bilat; no bruits. No lymphadenopathy or thyromegaly appreciated. Cor: PMI nondisplaced. Regular rhythm, mildly tachy rate. No rubs, gallops or murmurs. Lungs: Clear Abdomen: Soft, nontender, nondistended. No hepatosplenomegaly. No bruits or masses. Good bowel sounds. Extremities: No cyanosis, clubbing, rash,  edema Neuro: Alert & orientedx3, cranial nerves grossly intact. moves all 4 extremities w/o difficulty. Affect pleasant   Telemetry   Sinus tach w/ occasional PVCs. No VT   EKG    No new EKG to review   Labs    CBC Recent Labs    04/10/20 0611 04/10/20 0611 04/11/20 0500 04/12/20 0403  WBC 11.3*   < > 10.2 9.9  NEUTROABS 8.7*  --   --   --   HGB 14.2   < > 13.4 12.9*  HCT 43.0   < > 40.6 39.1  MCV 85.3   < > 83.4 84.3  PLT 233   < > 226 220   < > = values in this interval not displayed.   Basic Metabolic Panel Recent Labs    04/10/20 0611 04/10/20 1027 04/11/20 1800 04/12/20 0403  NA 136   < > 135 134*  K 6.0*   < > 3.8 3.8  CL 103   < > 96* 97*  CO2 18*   < > 29 30  GLUCOSE 105*   < > 147* 162*  BUN 48*   < > 35* 32*  CREATININE 1.94*   < > 1.66* 1.52*  CALCIUM 8.6*   < > 8.1* 7.9*  MG 2.1  --   --  1.5*   < > = values in this interval not displayed.   Liver Function Tests Recent  Labs    04/11/20 0500 04/12/20 0403  AST 67* 48*  ALT 198* 139*  ALKPHOS 115 99  BILITOT 3.7* 1.1  PROT 6.4* 5.3*  ALBUMIN 2.8* 2.2*   Recent Labs    04/09/20 1342 04/09/20 1745  LIPASE 178*  --   AMYLASE  --  152*   Cardiac Enzymes No results for input(s): CKTOTAL, CKMB, CKMBINDEX, TROPONINI in the last 72 hours.  BNP: BNP (last 3 results) Recent Labs    05/04/19 1006 04/01/20 0629 04/09/20 1209  BNP 1,206.6* 773.7* 1,195.9*    ProBNP (last 3 results) Recent Labs    01/30/20 1645  PROBNP 1,759*     D-Dimer No results for input(s): DDIMER in the last 72 hours. Hemoglobin A1C No results for input(s): HGBA1C in the last 72 hours. Fasting Lipid Panel No results for input(s): CHOL, HDL, LDLCALC, TRIG, CHOLHDL, LDLDIRECT in the last 72 hours. Thyroid Function Tests No results for input(s): TSH, T4TOTAL, T3FREE, THYROIDAB in the last 72 hours.  Invalid input(s): FREET3  Other results:   Imaging     No results found.   Medications:      Scheduled Medications: . aspirin EC  81 mg Oral Daily  . atorvastatin  80 mg Oral Daily  . Chlorhexidine Gluconate Cloth  6 each Topical Daily  . digoxin  0.125 mg Oral Daily  . furosemide  80 mg Oral Daily  . guaiFENesin  600 mg Oral BID  . heparin injection (subcutaneous)  5,000 Units Subcutaneous Q8H  . pantoprazole  40 mg Oral Daily  . polyethylene glycol  17 g Oral Daily  . sodium chloride flush  10-40 mL Intracatheter Q12H  . sodium chloride flush  3 mL Intravenous Once  . spironolactone  12.5 mg Oral Daily     Infusions: . milrinone 0.125 mcg/kg/min (04/12/20 0400)     PRN Medications:  sodium chloride flush    Assessment/Plan   1. 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 Now with EF down to <20% with mild RV dysfunction. Cath in 10/19 showed 90% ostial OM1 and 70% PLV, no interventional target.  Suspect mixed ischemic/nonischemic cardiomyopathy.  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 Cocaine likely plays a role in his cardiomyopathy (actively using).  Developed cardiogenic shock this admit, improved now on milrinone. Co-ox 62% on 0.125 mcg. Lactic acid normalized   - stop milrinone today and follow co-ox - Continue Digoxin 0.125 mg  - Continue Spiro 12.5 mg daily.  - no ARB/ARNi yet w/ AKI and low BP  - no  blocker w/ shock and low BP  - Not a candidate for advanced therapies or home milrinone with active use of cocaine.   - Volume overall improved w/ IV Lasix. Now on PO, continue Lasix 80 mg daily  2. CAD: H/o anterior STEMI with DES to LAD in 7/15. As above, cath in 10/19 with 90% ostial OM1 and 70% PLV, no interventional target. Mildly elevated HS-TnI with no trend at admission, suspect demand ischemia from volume overload.  - Continue ASA 81 and atorvastatin 80 mg daily.  3. Cocaine abuse: Counseled to quit.  4. Active smoker:  Counseled to quit.  5. AKI: Creatinine trending down, 2.1>>1.8>>1.6>>1.5.  Suspect cardiorenal syndrome.   Length of Stay: [address] Tyler Wong  04/12/2020, 12:51 PM  Advanced Heart Failure Team Pager [phone] (M-F; 7a - 4p)  Please contact  [facility] for night-coverage after hours (4p -7a ) and weekends on [email or web address]  Patient seen with PA, agree with the above note.   Creatinine down to 1.5 today.  Co-ox 62%, CVP 9 on my measure.  No complaints.   General: NAD Neck: JVP 8 cm, no thyromegaly or thyroid nodule.  Lungs: Clear to auscultation bilaterally with normal respiratory effort. CV: Lateral PMI.  Heart regular S1/S2, no S3/S4, no murmur.  No peripheral edema.   Abdomen: Soft, nontender, no hepatosplenomegaly, no distention.  Skin: Intact without lesions or rashes.  Neurologic: Alert and oriented x 3.  Psych: Normal affect. Extremities: No clubbing or cyanosis.  HEENT: Normal.   Stop milrinone today.  Continue current digoxin and spironolactone and will add losartan 12.5 mg daily.  He does not seem to have enough BP room to restart Entresto at this point.  Will reassess this tomorrow.    Continue Lasix 80 mg daily, CVP 9 today.   Not a candidate for advanced therapies due to cocaine abuse.  Hopefully he will quit.  Aim for home over the weekend.   Loralie Champagne 04/12/2020 3:15 PM

## 2020-04-12 NOTE — Consult Note (Addendum)
Consultation Note Date: 04/12/2020   Patient Name: Tyler Wong  DOB: June 30, 1966  MRN: [id number]  Age / Sex: 54 y.o., male  PCP: [name], NP Referring Physician: Florencia Reasons, MD  Reason for Consultation: Establishing goals of care  HPI/Patient Profile: 54 y.o. 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  Toxicology screen was cocaine positive.  Echocardiogram this admission shows his ejection fraction has decreased to less than 20%, he has grade 2 diastolic dysfunction and he has significant valvular disease.  He is not a candidate for home ionotrope or other advanced heart failure therapies as he has on-going cocaine abuse.  Clinical Assessment and Goals of Care:  I have reviewed medical records including EPIC notes, labs and imaging, received report from the care team, examined the patient and talked with him at bedside to discuss diagnosis prognosis, GOC, EOL wishes, disposition and options.  I introduced Palliative Medicine as specialized medical care for people living with serious illness. It focuses on providing relief from the symptoms and stress of a serious illness.   We discussed a brief life review of the patient. He lives locally and has two daughters with whom he is close.  He used to enjoy travelling for pleasure and working (building decks and Manufacturing engineer) but states he can't do much anymore.  He believes in God and considers himself Tyler Wong but does not practice religion.  We discussed his current illness and what it means in the larger context of his on-going co-morbidities.  Copelin seems to understand that his heart disease is progressive.  He tells me that the MDs have told him the medications he is receiving may help or may not.  We  talked about the decrease in his ejection fraction and the fact that this was very concerning.   I expressed that we will do everything to support his health and keep him alive but want both he and his family to be prepared for what may be coming.  I explained that one of the things Palliative does in the hospital is talk about life and death - and how you want to spend your last months.  Shulem gave me a look of surprise.  I attempted to elicit values and goals of care important to the patient. Deago stated that he had not considered that and needed to think about it.  Then we talked further and he indicated that he would not want to suffer in pain if he was near death.  He also agreed with certain things being necessary for quality of life such as - being able to talk and interact with his family, being able to care of himself, being able to eat without a tube.  Tarak talked about his sister that died of heart failure at [facility].  The experience changed him.  He stated that the family had conversations about what they  would and would not want in a similar situation.    Carrie stated that his sister Tyler Wong and his "baby mama" Tyler Wong know his wishes and he would want them to be his medical decision making surrogate.  I reviewed an Scientist, physiological with Mordecai and encouraged him to fill it out in order to benefit his family - so that if they had to make hard choices on his behalf they would be supported by his written guidance.   I offered to facilitate a conversation with Lorre Nick, and Chrys Racer, but Perris said he would prefer to talk with them on his own and he felt they knew his wishes.  I encouraged Juell to talk with them anyway for their benefit.  Questions and concerns were addressed.  The patient was encouraged to call with questions or concerns.    Primary Decision Maker:  PATIENT    SUMMARY OF RECOMMENDATIONS    I'll visit Tyler Wong again tomorrow regarding HCPOA and advanced directives.   I believe our conversation was surprising and he may have had a chance to reflect on it when I follow up with him tomorrow.   Code Status/Advance Care Planning: Full  Symptom Management:   Per primary.    Additional Recommendations (Limitations, Scope, Preferences):  Full Scope Treatment  Psycho-social/Spiritual:   Desire for further Chaplaincy support: Not at this time.  Prognosis: Very concerning particularly if he continues to use cocaine.  He is at high risk for acute decline and death or at a minimum rehospitalization.    Discharge Planning: Home with Home Health      Primary Diagnoses: Present on Admission: . Symptomatic anemia . Cocaine use . Acute combined systolic and diastolic congestive heart failure ([place])   I have reviewed the medical record, interviewed the patient and family, and examined the patient. The following aspects are pertinent.  Past Medical History:  Diagnosis Date  . CAD in native artery 07/17/14   STEMI- LAD stenosis with DES  . CHF (congestive heart failure) ([place])   . Cocaine abuse ([place])   . COPD (chronic obstructive pulmonary disease) ([place])   . Dyspnea   . Hypertension   . ST elevation myocardial infarction (STEMI) involving left anterior descending (LAD) coronary artery with complication ([place])   . Tobacco abuse 07/17/2014   Social History   Socioeconomic History  . Marital status: Single    Spouse name: Not on file  . Number of children: 2  . Years of education: 54  . Highest education level: 12th grade  Occupational History  . Occupation: Landscaping  Tobacco Use  . Smoking status: Current Every Day Smoker    Packs/day: 0.50    Years: 30.00    Pack years: 15.00    Types: Cigarettes  . Smokeless tobacco: Never Used  . Tobacco comment: .5 PK PER DAY  Substance and Sexual Activity  . Alcohol use: No    Comment: Patient denies abuse, states social drinker  . Drug use: Not Currently    Types: Cocaine    Comment: heroin  .  Sexual activity: Yes  Other Topics Concern  . Not on file  Social History Narrative   Lives in [place] with his sister and girlfriend. He works Aeronautical engineer work.    Social Determinants of Health   Financial Resource Strain:   . Difficulty of Paying Living Expenses:   Food Insecurity:   . Worried About Charity fundraiser in the Last Year:   . Ran  Out of Food in the Last Year:   Transportation Needs:   . Lack of Transportation (Medical):   Marland Kitchen Lack of Transportation (Non-Medical):   Physical Activity:   . Days of Exercise per Week:   . Minutes of Exercise per Session:   Stress:   . Feeling of Stress :   Social Connections:   . Frequency of Communication with Friends and Family:   . Frequency of Social Gatherings with Friends and Family:   . Attends Religious Services:   . Active Member of Clubs or Organizations:   . Attends Archivist Meetings:   Marland Kitchen Marital Status:    Family History  Problem Relation Age of Onset  . Cirrhosis Father   . Heart attack Mother   . Heart attack Sister   . Heart failure Sister   . Heart attack Brother    Scheduled Meds: . aspirin EC  81 mg Oral Daily  . atorvastatin  80 mg Oral Daily  . Chlorhexidine Gluconate Cloth  6 each Topical Daily  . digoxin  0.125 mg Oral Daily  . furosemide  80 mg Oral Daily  . guaiFENesin  600 mg Oral BID  . heparin injection (subcutaneous)  5,000 Units Subcutaneous Q8H  . pantoprazole  40 mg Oral Daily  . polyethylene glycol  17 g Oral Daily  . sodium chloride flush  10-40 mL Intracatheter Q12H  . sodium chloride flush  3 mL Intravenous Once  . spironolactone  12.5 mg Oral Daily   Continuous Infusions: PRN Meds:.sodium chloride flush No Known Allergies    Vital Signs: BP 95/60 (BP Location: Left Leg)   Pulse 97   Temp 98 F (36.7 C) (Oral)   Resp 20   Ht 5\' 10"  (1.778 m)   Wt 75.3 kg   SpO2 94%   BMI 23.83 kg/m  Pain Scale: 0-10   Pain Score: 0-No pain   SpO2: SpO2:  94 % O2 Device:SpO2: 94 % O2 Flow Rate: .O2 Flow Rate (L/min): 2 L/min  IO: Intake/output summary:   Intake/Output Summary (Last 24 hours) at 04/12/2020 1429 Last data filed at 04/12/2020 1100 Gross per 24 hour  Intake 878.85 ml  Output 900 ml  Net -21.15 ml    LBM: Last BM Date: 04/09/20 Baseline Weight: Weight: 79.8 kg Most recent weight: Weight: 75.3 kg     Palliative Assessment/Data: 40%     Time In: 1:30 Time Out: 2:30 Time Total: 60 min. Visit consisted of counseling and education dealing with the complex and emotionally intense issues surrounding the need for palliative care and symptom management in the setting of serious and potentially life-threatening illness. Greater than 50%  of this time was spent counseling and coordinating care related to the above assessment and plan.  Signed by: Florentina Jenny, PA-C Palliative Medicine  Please contact Palliative Medicine Team phone at [phone] for questions and concerns.  For individual provider: See Shea Evans

## 2020-04-12 NOTE — Progress Notes (Signed)
pts spouse Chrys Racer is at bedside.  Nurse spoke with pts spouse and pt regarding plan of care with pt's permission.

## 2020-04-12 NOTE — Progress Notes (Signed)
   04/12/20 1500  Mobility  Activity Ambulated in hall  Range of Motion/Exercises Active;All extremities  Level of Assistance Minimal assist, patient does 75% or more  Assistive Device None  Distance Ambulated (ft) 200 ft  Mobility Response Tolerated well   Oxygen saturation 97% at room air while ambulating.

## 2020-04-13 DIAGNOSIS — I5041 Acute combined systolic (congestive) and diastolic (congestive) heart failure: Secondary | ICD-10-CM | POA: Diagnosis not present

## 2020-04-13 LAB — BPAM RBC
Blood Product Expiration Date: 202105042359
Blood Product Expiration Date: 202105062359
Blood Product Expiration Date: 202105062359
Blood Product Expiration Date: 202105062359
Blood Product Expiration Date: 202105062359
ISSUE DATE / TIME: 202104131509
ISSUE DATE / TIME: 202104131741
Unit Type and Rh: 7300
Unit Type and Rh: 7300
Unit Type and Rh: 7300
Unit Type and Rh: 7300
Unit Type and Rh: 7300

## 2020-04-13 LAB — TYPE AND SCREEN
ABO/RH(D): B POS
Antibody Screen: NEGATIVE
Unit division: 0
Unit division: 0
Unit division: 0
Unit division: 0
Unit division: 0

## 2020-04-13 LAB — COOXEMETRY PANEL
Carboxyhemoglobin: 0.9 % (ref 0.5–1.5)
Methemoglobin: 0.7 % (ref 0.0–1.5)
O2 Saturation: 59.3 %
Total hemoglobin: 13 g/dL (ref 12.0–16.0)

## 2020-04-13 LAB — HEPATIC FUNCTION PANEL
ALT: 122 U/L — ABNORMAL HIGH (ref 0–44)
AST: 37 U/L (ref 15–41)
Albumin: 2.4 g/dL — ABNORMAL LOW (ref 3.5–5.0)
Alkaline Phosphatase: 103 U/L (ref 38–126)
Bilirubin, Direct: 0.3 mg/dL — ABNORMAL HIGH (ref 0.0–0.2)
Indirect Bilirubin: 0.7 mg/dL (ref 0.3–0.9)
Total Bilirubin: 1 mg/dL (ref 0.3–1.2)
Total Protein: 6 g/dL — ABNORMAL LOW (ref 6.5–8.1)

## 2020-04-13 LAB — BASIC METABOLIC PANEL
Anion gap: 8 (ref 5–15)
BUN: 26 mg/dL — ABNORMAL HIGH (ref 6–20)
CO2: 29 mmol/L (ref 22–32)
Calcium: 8.3 mg/dL — ABNORMAL LOW (ref 8.9–10.3)
Chloride: 97 mmol/L — ABNORMAL LOW (ref 98–111)
Creatinine, Ser: 1.36 mg/dL — ABNORMAL HIGH (ref 0.61–1.24)
GFR calc Af Amer: >60 mL/min (ref >60)
GFR calc non Af Amer: 59 mL/min — ABNORMAL LOW (ref >60)
Glucose, Bld: 144 mg/dL — ABNORMAL HIGH (ref 70–99)
Potassium: 4.2 mmol/L (ref 3.5–5.1)
Sodium: 134 mmol/L — ABNORMAL LOW (ref 135–145)

## 2020-04-13 LAB — MAGNESIUM: Magnesium: 1.9 mg/dL (ref 1.7–2.4)

## 2020-04-13 LAB — HEPATITIS PANEL, ACUTE
HCV Ab: REACTIVE — AB
Hep A IgM: NONREACTIVE
Hep B C IgM: NONREACTIVE
Hepatitis B Surface Ag: NONREACTIVE

## 2020-04-13 MED ORDER — BIOTENE DRY MOUTH MT LIQD: 15.0000 mL | OROMUCOSAL | Status: DC | PRN

## 2020-04-13 MED ORDER — SPIRONOLACTONE 25 MG PO TABS
25.0000 mg | ORAL_TABLET | Freq: Every day | ORAL | Status: DC
  Administered 2020-04-14 – 2020-04-15 (×2): 25 mg via ORAL
  Filled 2020-04-13 (×2): qty 1

## 2020-04-13 MED ORDER — SPIRONOLACTONE 12.5 MG HALF TABLET
12.5000 mg | ORAL_TABLET | Freq: Once | ORAL | Status: AC
  Administered 2020-04-13: 12.5 mg via ORAL
  Filled 2020-04-13: qty 1

## 2020-04-13 MED ORDER — FUROSEMIDE 10 MG/ML IJ SOLN
80.0000 mg | Freq: Two times a day (BID) | INTRAMUSCULAR | Status: DC
  Administered 2020-04-13 – 2020-04-14 (×2): 80 mg via INTRAVENOUS
  Filled 2020-04-13 (×2): qty 8

## 2020-04-13 NOTE — Progress Notes (Signed)
Patient ID: Tyler Wong, male   DOB: 1966-08-17, 54 y.o.   MRN: [id number]     Advanced Heart Failure Rounding Note  PCP-Cardiologist: Shelva Majestic, MD     Patient Profile   Tyler Wong is a 54 year old with a history of CAD, DES to LAD in 2015, HTN, cocaine use, and chronic systolic heart failure. Admitted for [name] in the setting of poor med compliance and cocaine use (UDS+). Found to have low co-ox and hypotension and started on milrinone.   Echo this admit shows biventricular dysfunction. LVEF down to <20% (previously 40-45%). RV mildly reduce.   Subjective:    He is now off milrinone, co-ox 59% today.  He is mildly tachypneic, feels a little more short of breath though wants to go home.  CVP 14-15, higher today.  Has been on po Lasix.  Weight up 2 lbs.   Objective:   Weight Range: 76.3 kg Body mass index is 24.13 kg/m.   Vital Signs:   Temp:  [97.4 F (36.3 C)-98.5 F (36.9 C)] 97.4 F (36.3 C) (04/17 0322) Pulse Rate:  [99-102] 99 (04/17 0322) Resp:  [17-23] 17 (04/17 0320) BP: (99-119)/(73-94) 99/73 (04/17 0320) SpO2:  [97 %] 97 % (04/17 0322) Weight:  [76.3 kg] 76.3 kg (04/17 0027) Last BM Date: 04/13/20  Weight change: Filed Weights   04/11/20 1553 04/12/20 0329 04/13/20 0027  Weight: 74 kg 75.3 kg 76.3 kg    Intake/Output:   Intake/Output Summary (Last 24 hours) at 04/13/2020 1013 Last data filed at 04/13/2020 0843 Gross per 24 hour  Intake 1200 ml  Output 2300 ml  Net -1100 ml      Physical Exam    CVP 14-15 General: NAD Neck: JVP 12-14 cm, no thyromegaly or thyroid nodule.  Lungs: Clear to auscultation bilaterally with normal respiratory effort. CV: Lateral PMI.  Heart regular S1/S2, soft S3, no murmur.  No peripheral edema.   Abdomen: Soft, nontender, no hepatosplenomegaly, no distention.  Skin: Intact without lesions or rashes.  Neurologic: Alert and oriented x 3.  Psych: Normal affect. Extremities: No clubbing or cyanosis.  HEENT: Normal.      Telemetry   NSR (personally reviewed)  EKG    No new EKG to review   Labs    CBC Recent Labs    04/11/20 0500 04/12/20 0403  WBC 10.2 9.9  HGB 13.4 12.9*  HCT 40.6 39.1  MCV 83.4 84.3  PLT 226 [id number]   Basic Metabolic Panel Recent Labs    04/12/20 0403 04/13/20 0500  NA 134* 134*  K 3.8 4.2  CL 97* 97*  CO2 30 29  GLUCOSE 162* 144*  BUN 32* 26*  CREATININE 1.52* 1.36*  CALCIUM 7.9* 8.3*  MG 1.5* 1.9   Liver Function Tests Recent Labs    04/12/20 0403 04/13/20 0500  AST 48* 37  ALT 139* 122*  ALKPHOS 99 103  BILITOT 1.1 1.0  PROT 5.3* 6.0*  ALBUMIN 2.2* 2.4*   No results for input(s): LIPASE, AMYLASE in the last 72 hours. Cardiac Enzymes No results for input(s): CKTOTAL, CKMB, CKMBINDEX, TROPONINI in the last 72 hours.  BNP: BNP (last 3 results) Recent Labs    05/04/19 1006 04/01/20 0629 04/09/20 1209  BNP 1,206.6* 773.7* 1,195.9*    ProBNP (last 3 results) Recent Labs    01/30/20 1645  PROBNP 1,759*     D-Dimer No results for input(s): DDIMER in the last 72 hours. Hemoglobin A1C Recent Labs  04/12/20 0403  HGBA1C 6.5*   Fasting Lipid Panel No results for input(s): CHOL, HDL, LDLCALC, TRIG, CHOLHDL, LDLDIRECT in the last 72 hours. Thyroid Function Tests No results for input(s): TSH, T4TOTAL, T3FREE, THYROIDAB in the last 72 hours.  Invalid input(s): FREET3  Other results:   Imaging    DG Chest 2 View  Result Date: 04/12/2020 CLINICAL DATA:  Cough. EXAM: CHEST - 2 VIEW COMPARISON:  04/09/2020. FINDINGS: PICC line noted with tip over SVC. Cardiomegaly. No pulmonary venous congestion. Bilateral interstitial prominence. CHF could present in this fashion. Small right pleural effusion. No pneumothorax. IMPRESSION: 1.  PICC line noted with tip over SVC. 2. Cardiomegaly. Bilateral interstitial prominence consistent interstitial edema. Small right pleural effusion. Findings consistent with CHF. Electronically Signed   By: Marcello Moores   Register   On: 04/12/2020 13:46     Medications:     Scheduled Medications: . aspirin EC  81 mg Oral Daily  . atorvastatin  80 mg Oral Daily  . Chlorhexidine Gluconate Cloth  6 each Topical Daily  . digoxin  0.125 mg Oral Daily  . furosemide  80 mg Intravenous BID  . guaiFENesin  600 mg Oral BID  . heparin injection (subcutaneous)  5,000 Units Subcutaneous Q8H  . losartan  12.5 mg Oral QHS  . pantoprazole  40 mg Oral Daily  . polyethylene glycol  17 g Oral Daily  . sodium chloride flush  10-40 mL Intracatheter Q12H  . sodium chloride flush  3 mL Intravenous Once  . spironolactone  12.5 mg Oral Once  . [START ON 04/14/2020] spironolactone  25 mg Oral Daily    Infusions:   PRN Medications: sodium chloride flush    Assessment/Plan   1. 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 Now with EF down to <20% with mild RV dysfunction. Cath in 10/19 showed 90% ostial OM1 and 70% PLV, no interventional target.  Suspect mixed ischemic/nonischemic cardiomyopathy.  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 Cocaine likely plays a role in his cardiomyopathy (actively using).  Developed cardiogenic shock this admit, improved on milrinone. Weaned off milrinone and co-ox 59% today.  CVP rising on po Lasix, 14-15 today.  Looks more short of breath. - Stay off milrinone for now.  - With increased volume, will restart Lasix 80 mg IV bid.  He had po Lasix this morning so will start this afternoon.  - Continue digoxin 0.125 daily  - Increase spironolactone to 25 mg daily.  - Continue losartan 12.5 mg daily, SBP 90s-100s so not on ARNI yet.   - no  blocker w/ shock and low BP  - Not a candidate for advanced therapies or home milrinone with active use of cocaine.   2. CAD: H/o anterior STEMI with DES to LAD in 7/15. As above, cath in 10/19 with 90% ostial OM1 and 70% PLV, no  interventional target. Mildly elevated HS-TnI with no trend at admission, suspect demand ischemia from volume overload.  - Continue ASA 81 and atorvastatin 80 mg daily.  3. Cocaine abuse: Counseled to quit.  4. Active smoker: Counseled to quit.  5. AKI: Creatinine trending down, 2.1>>1.8>>1.6>>1.5>>1.36.  Suspect cardiorenal syndrome.   Length of Stay: [place], MD  04/13/2020, 10:13 AM  Advanced Heart Failure Team Pager [phone] (M-F; [place])  Please contact [facility] for night-coverage after hours (4p -7a ) and weekends on [email or web address]

## 2020-04-13 NOTE — Progress Notes (Signed)
PROGRESS NOTE  Tyler Wong WNI:[id number] DOB: [birth year] DOA: 04/09/2020 PCP: [name], NP  Tyler Wong is a 54 year old with a history of tobacco use, copd, CAD, DES to LAD in 2015, HTN, cocaine use, and chronic systolic heart failure. Admitted for [name] in the setting of poor med compliance and cocaine use (UDS+). Found to have low co-ox and hypotension and started on milrinone.   Echo this admit shows biventricular dysfunction. LVEF down to <20% (previously 40-45%). RV mildly reduce.   Initially presented to [facility] ED is complaining of short of breath ,bloating and one episode of vomiting Initial hemoglobin was 3.6, he received 1 unit PRBC in the ED , critical care consulted for admission CTA chest abdomen pelvis 04/09/2020-cardiac enlargement, coronary artery calcification, mild emphysema with pulmonary scarring.  No acute abdominal process. fobt negative Repeat cbc after one unit of prbc transfusion is 13.4 , so felt initial cbc result was false   HPI/Recap of past 24 hours:  Feeling more sob today, c/o stomach tightness Denies chest pain, no hypoxia at rest   Assessment/Plan: Active Problems:   Cocaine use   Acute combined systolic and diastolic congestive heart failure (HCC)   Symptomatic anemia   Cardiogenic shock (HCC)   CKD (chronic kidney disease) stage 3, GFR 30-59 ml/min   Palliative care encounter  Acute on chronic systolic CHF/severe Tyler/cardiomyopathy/cardiogenic shock -plan per Heart failure team  -Treated with miodarone drip which is discontinued on 4/16 pm after lactic acid normalization and improvement in coocx -on dig, spironolactone, losartan , lasix -no ? blocker w/ shock and low BP  -Not a candidate for advanced therapies or home milrinone with active use of cocaine. -feeling more sob on 4/17, feeling stomach tightness  , lasix dose increased to 61m bid per heart failure team -palliative care consulted per cardiology  recommendation  Hypokalemia/hyppmagnesemia: Replaced, improved, Repeat in am  CAD s/p DES LAD On asa, statin Denies chest pain  AKI on CKD aki likely due to cardiorenal syndrome Bun/cr 49/2.05 on presentation Improving, today bun/cr is 26/1.36  Elevated lft Likely from liver congestion  initial  cta ab showed "Hepatobiliary: Severe hepatic congestion findings with reflux of contrast down the IVC, into the dilated hepatic veins and into the adjacent lung parenchyma likely due to heart failure and or tricuspid regurgitation." Check hepatitis panel  Impaired fasting blood glucose A.m. blood glucose 162-144  A1c 6.5 Carb modified diet  HTN Presented with cardiogenic shock BP improving, off milrinone drip BP stable on lasix/ spironolactone/ losartan  Substance abuse Active cocaine use, cessation education provided  Smoking cessation education provided   DVT Prophylaxis: Subcu heparin  Code Status: Full  Family Communication: patient and wife at bedside with permission  Disposition Plan:    Patient came from:                      Home                                                                                     Anticipated d/c place:  TBD  Barriers to d/c OR conditions which need to be  met to effect a safe d/c:  Needs cardiology clearance   Consultants:  Critical care  Cardiology/heart failure team  Palliative care  Procedures:  PRBC transfusion x1 on admission  PICC line placement  Antibiotics:  None   Objective: BP 99/73 (BP Location: Left Leg)   Pulse 99   Temp (!) 97.4 F (36.3 C) (Oral)   Resp 17   Ht 5' 10"  (1.778 m)   Wt 76.3 kg   SpO2 97%   BMI 24.13 kg/m   Intake/Output Summary (Last 24 hours) at 04/13/2020 1023 Last data filed at 04/13/2020 0843 Gross per 24 hour  Intake 1200 ml  Output 2300 ml  Net -1100 ml   Filed Weights   04/11/20 1553 04/12/20 0329 04/13/20 0027  Weight: 74 kg 75.3 kg 76.3 kg    Exam: Patient  is examined daily including today on 04/13/2020, exams remain the same as of yesterday except that has changed    General:  NAD  Cardiovascular: RRR  Respiratory: diminished at basis  Abdomen: Soft/ND/NT, positive BS  Musculoskeletal: No Edema  Neuro: alert, oriented   Data Reviewed: Basic Metabolic Panel: Recent Labs  Lab 04/10/20 0611 04/10/20 1027 04/10/20 1619 04/11/20 0500 04/11/20 1800 04/12/20 0403 04/13/20 0500  NA 136   < > 139 138 135 134* 134*  K 6.0*   < > 4.4 3.7 3.8 3.8 4.2  CL 103   < > 100 95* 96* 97* 97*  CO2 18*   < > 27 30 29 30 29   GLUCOSE 105*   < > 154* 134* 147* 162* 144*  BUN 48*   < > 48* 38* 35* 32* 26*  CREATININE 1.94*   < > 2.05* 1.81* 1.66* 1.52* 1.36*  CALCIUM 8.6*   < > 9.0 8.7* 8.1* 7.9* 8.3*  MG 2.1  --   --   --   --  1.5* 1.9   < > = values in this interval not displayed.   Liver Function Tests: Recent Labs  Lab 04/09/20 1342 04/11/20 0500 04/12/20 0403 04/13/20 0500  AST 92* 67* 48* 37  ALT 321* 198* 139* 122*  ALKPHOS 154* 115 99 103  BILITOT 2.3* 3.7* 1.1 1.0  PROT 9.0* 6.4* 5.3* 6.0*  ALBUMIN 4.0 2.8* 2.2* 2.4*   Recent Labs  Lab 04/09/20 1342 04/09/20 1745  LIPASE 178*  --   AMYLASE  --  152*   No results for input(s): AMMONIA in the last 168 hours. CBC: Recent Labs  Lab 04/09/20 1209 04/09/20 1209 04/09/20 1745 04/09/20 2108 04/10/20 0611 04/11/20 0500 04/12/20 0403  WBC 2.9*  --  11.1*  --  11.3* 10.2 9.9  NEUTROABS 2.3  --   --   --  8.7*  --   --   HGB 3.6*   < > 13.4 15.6 14.2 13.4 12.9*  HCT 12.1*   < > 42.3 46.1 43.0 40.6 39.1  MCV 91.0  --  87.8  --  85.3 83.4 84.3  PLT 253  --  232  --  233 226 220   < > = values in this interval not displayed.   Cardiac Enzymes:   No results for input(s): CKTOTAL, CKMB, CKMBINDEX, TROPONINI in the last 168 hours. BNP (last 3 results) Recent Labs    05/04/19 1006 04/01/20 0629 04/09/20 1209  BNP 1,206.6* 773.7* 1,195.9*    ProBNP (last 3  results) Recent Labs    01/30/20 1645  PROBNP 1,759*    CBG: Recent Labs  Lab 04/10/20 1647  GLUCAP 118*    Recent Results (from the past 240 hour(s))  MRSA PCR Screening     Status: None   Collection Time: 04/09/20  1:21 AM   Specimen: Nasal Mucosa; Nasopharyngeal  Result Value Ref Range Status   MRSA by PCR NEGATIVE NEGATIVE Final    Comment:        The GeneXpert MRSA Assay (FDA approved for NASAL specimens only), is one component of a comprehensive MRSA colonization surveillance program. It is not intended to diagnose MRSA infection nor to guide or monitor treatment for MRSA infections. Performed at [facility], [place] [address], [place], [place] [address]   SARS CORONAVIRUS 2 (TAT 6-24 HRS) Nasopharyngeal Nasopharyngeal Swab     Status: None   Collection Time: 04/09/20  5:27 PM   Specimen: Nasopharyngeal Swab  Result Value Ref Range Status   SARS Coronavirus 2 NEGATIVE NEGATIVE Final    Comment: (NOTE) SARS-CoV-2 target nucleic acids are NOT DETECTED. The SARS-CoV-2 RNA is generally detectable in upper and lower respiratory specimens during the acute phase of infection. Negative results do not preclude SARS-CoV-2 infection, do not rule out co-infections with other pathogens, and should not be used as the sole basis for treatment or other patient management decisions. Negative results must be combined with clinical observations, patient history, and epidemiological information. The expected result is Negative. Fact Sheet for Patients: SugarRoll.be Fact Sheet for Healthcare Providers: https://www.woods-mathews.com/ This test is not yet approved or cleared by the [place] FDA and  has been authorized for detection and/or diagnosis of SARS-CoV-2 by FDA under an Emergency Use Authorization (EUA). This EUA will remain  in effect (meaning this test can be used) for the duration of the COVID-19  declaration under Section 56 4(b)(1) of the Act, 21 U.S.C. section 360bbb-3(b)(1), unless the authorization is terminated or revoked sooner. Performed at [facility], [place] [address], [place], [place] [address]      Studies: DG Chest 2 View  Result Date: 04/12/2020 CLINICAL DATA:  Cough. EXAM: CHEST - 2 VIEW COMPARISON:  04/09/2020. FINDINGS: PICC line noted with tip over SVC. Cardiomegaly. No pulmonary venous congestion. Bilateral interstitial prominence. CHF could present in this fashion. Small right pleural effusion. No pneumothorax. IMPRESSION: 1.  PICC line noted with tip over SVC. 2. Cardiomegaly. Bilateral interstitial prominence consistent interstitial edema. Small right pleural effusion. Findings consistent with CHF. Electronically Signed   By: Marcello Moores  Register   On: 04/12/2020 13:46    Scheduled Meds: . aspirin EC  81 mg Oral Daily  . atorvastatin  80 mg Oral Daily  . Chlorhexidine Gluconate Cloth  6 each Topical Daily  . digoxin  0.125 mg Oral Daily  . furosemide  80 mg Intravenous BID  . guaiFENesin  600 mg Oral BID  . heparin injection (subcutaneous)  5,000 Units Subcutaneous Q8H  . losartan  12.5 mg Oral QHS  . pantoprazole  40 mg Oral Daily  . polyethylene glycol  17 g Oral Daily  . sodium chloride flush  10-40 mL Intracatheter Q12H  . sodium chloride flush  3 mL Intravenous Once  . spironolactone  12.5 mg Oral Once  . [START ON 04/14/2020] spironolactone  25 mg Oral Daily    Continuous Infusions:    Time spent: 77mns I have personally reviewed and interpreted on  04/13/2020 daily labs, tele strips, imagings as discussed above under date review session and assessment and plans.  I reviewed all nursing notes, pharmacy notes,  consultant notes,  vitals, pertinent old records  I have discussed plan of care as described above with RN , patient and family on 04/13/2020   Florencia Reasons MD, PhD, FACP  [facility]  Available via Epic secure chat 7am-7pm for  nonurgent issues Please page for urgent issues, pager number available through [email or web address] .   04/13/2020, 10:23 AM  LOS: 4 days

## 2020-04-13 NOTE — Progress Notes (Signed)
Pt had 12 bts of Vtach nonsustain. Pt has no complains and he "feels ok" as per pt. Will monitor

## 2020-04-13 NOTE — Progress Notes (Signed)
Daily Progress Note   Patient Name: Tyler Wong       Date: 04/13/2020 DOB: Oct 15, 1966  Age: 54 y.o. MRN#: [id number] Attending Physician: Florencia Reasons, MD Primary Care Physician: [name], NP Admit Date: 04/09/2020  Reason for Consultation/Follow-up: To discuss complex medical decision making related to patient's goals of care  Subjective: Patient greets me hesitantly.   His sister is at bedside.  Kendell states he is not doing well (he is dyspneic and having jerking motions).  He states his fluid is building up and his stomach hurts.  His RN is about to give him lasix.  His sister [name] noted that I am with Palliative and said "we need to talk to you".  Lydia was somewhat in distress and in no mood for a goals of care discussion so I gave her my contact information and invited her to contact me anytime that she and Estuardo felt like talking.  Fortunately [name] called me when she left the hospital.  We plan to meet in Vega's room on 4/18 around 11:30 to discuss goals of care.  [name] said she feels Cevon would absolutely not want long term life support because of what the family went thru with his mother and sister.   However at this point she is in agreement with full code.  Assessment: Advanced heart failure - today in some distress with symptoms.  Tyler Wong's body language and responses to questions indicate that he does not want to talk about what his wishes would be if he was nearing end of life.   Patient Profile/HPI:   54 y.o. 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  Toxicology screen was cocaine positive.  Echocardiogram this admission  shows his ejection fraction has decreased to less than 20%, he has grade 2 diastolic dysfunction and he has significant valvular disease.  He is not a candidate for home ionotrope or other advanced heart failure therapies as he has on-going cocaine abuse.   Length of Stay: 4   Vital Signs: BP (!) 159/88 (BP Location: Left Leg)   Pulse 96   Temp (!) 97.4 F (36.3 C) (Oral)   Resp 20   Ht  5\' 10"  (1.778 m)   Wt 76.3 kg   SpO2 96%   BMI 24.13 kg/m  SpO2: SpO2: 96 % O2 Device: O2 Device: Room Air O2 Flow Rate: O2 Flow Rate (L/min): 2 L/min       Palliative Assessment/Data:  50%     Palliative Care Plan    Recommendations/Plan:  Tyler Wong does not seem accepting of the need to discuss his EOL wishes - he expresses that his family knows what those wishes are.  His sister indicated that they do not clearly know what his wishes are.  It would be helpful if Cardiology & TRH encouraged Amire to discuss his goals with his sister and me.     Aahaan wants his sister and SO to be the HCPOAs.  Without paperwork that responsibility would default to his daughters.  Consequently the [facility] paperwork is important for him.   PMT will follow up on Sunday at 11:30.   Code Status:  Full code  Prognosis:   < 6 months given advanced heart failure with cor pulmonale, valvular disease and CKD.   Discharge Planning:  Home with Home Health and Palliative care.  Care plan was discussed with patient and sister.  Thank you for allowing the Palliative Medicine Team to assist in the care of this patient.  Total time spent:  35 min.     Greater than 50%  of this time was spent counseling and coordinating care related to the above assessment and plan.  Florentina Jenny, PA-C Palliative Medicine  Please contact Palliative MedicineTeam phone at [phone] for questions and concerns between 7 am - 7 pm.   Please see AMION for individual provider pager numbers.

## 2020-04-14 LAB — HEPATIC FUNCTION PANEL
ALT: 120 U/L — ABNORMAL HIGH (ref 0–44)
AST: 35 U/L (ref 15–41)
Albumin: 2.8 g/dL — ABNORMAL LOW (ref 3.5–5.0)
Alkaline Phosphatase: 101 U/L (ref 38–126)
Bilirubin, Direct: 0.4 mg/dL — ABNORMAL HIGH (ref 0.0–0.2)
Indirect Bilirubin: 0.9 mg/dL (ref 0.3–0.9)
Total Bilirubin: 1.3 mg/dL — ABNORMAL HIGH (ref 0.3–1.2)
Total Protein: 7 g/dL (ref 6.5–8.1)

## 2020-04-14 LAB — COOXEMETRY PANEL
Carboxyhemoglobin: 1 % (ref 0.5–1.5)
Carboxyhemoglobin: 0.9 % (ref 0.5–1.5)
Methemoglobin: 1.1 % (ref 0.0–1.5)
Methemoglobin: 0.8 % (ref 0.0–1.5)
O2 Saturation: 42.7 %
O2 Saturation: 54.7 %
Total hemoglobin: 15 g/dL (ref 12.0–16.0)
Total hemoglobin: 13.8 g/dL (ref 12.0–16.0)

## 2020-04-14 LAB — BASIC METABOLIC PANEL
Anion gap: 7 (ref 5–15)
BUN: 25 mg/dL — ABNORMAL HIGH (ref 6–20)
CO2: 29 mmol/L (ref 22–32)
Calcium: 8.8 mg/dL — ABNORMAL LOW (ref 8.9–10.3)
Chloride: 100 mmol/L (ref 98–111)
Creatinine, Ser: 1.45 mg/dL — ABNORMAL HIGH (ref 0.61–1.24)
GFR calc Af Amer: >60 mL/min (ref >60)
GFR calc non Af Amer: 54 mL/min — ABNORMAL LOW (ref >60)
Glucose, Bld: 105 mg/dL — ABNORMAL HIGH (ref 70–99)
Potassium: 4.7 mmol/L (ref 3.5–5.1)
Sodium: 136 mmol/L (ref 135–145)

## 2020-04-14 LAB — MAGNESIUM: Magnesium: 1.8 mg/dL (ref 1.7–2.4)

## 2020-04-14 MED ORDER — MAGNESIUM SULFATE 2 GM/50ML IV SOLN
2.0000 g | Freq: Once | INTRAVENOUS | Status: AC
  Administered 2020-04-14: 2 g via INTRAVENOUS
  Filled 2020-04-14: qty 50

## 2020-04-14 MED ORDER — LOSARTAN POTASSIUM 25 MG PO TABS
25.0000 mg | ORAL_TABLET | Freq: Every day | ORAL | Status: DC
  Administered 2020-04-14: 25 mg via ORAL
  Filled 2020-04-14 (×2): qty 1

## 2020-04-14 MED ORDER — TORSEMIDE 20 MG PO TABS
40.0000 mg | ORAL_TABLET | Freq: Two times a day (BID) | ORAL | Status: DC
  Administered 2020-04-15 (×2): 40 mg via ORAL
  Filled 2020-04-14 (×2): qty 2

## 2020-04-14 NOTE — Progress Notes (Signed)
Daily Progress Note   Patient Name: Collin Rengel       Date: 04/14/2020 DOB: 1966-08-19  Age: 54 y.o. MRN#: [id number] Attending Physician: Florencia Reasons, MD Primary Care Physician: [name], NP Admit Date: 04/09/2020  Reason for Consultation/Follow-up: To discuss complex medical decision making related to patient's goals of care  Discussed briefly with Dr. Haroldine Laws.  Subjective: Met with patient and sister [name]) at bedside.  Fedor appeared much more comfortable today and states he is feeling much better.  Jaelynn and [name] are looking forward to talking with the doctors as they round.  We discussed  HCPOA and a Living Will.  Karas indicated that he wanted [name] as his HCPOA and Hoyle Sauer as the back up.  HOPOA is completed and ready for notary.  We reviewed each of the questions in the Living Will together.  Aurthur was receptive and asked if he could read it thoroughly and discuss it with [name] prior to signing.    I explained that I will request a Chaplain consult to notarize the document and that he should sign it in front of the notary.   Assessment: Improving.  Friendly patient with advancing heart failure and CKD.  Unfortunately he has cocaine addiction.   Patient Profile/HPI:  54 y.o. 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  Toxicology screen was cocaine positive.  Echocardiogram this admission shows his ejection fraction has decreased to less than 20%, he has grade 2 diastolic dysfunction and he has significant valvular disease.  He is not a candidate for home ionotrope or other advanced heart failure therapies as he has on-going  cocaine abuse.  Length of Stay: 5   Vital Signs: BP 105/74   Pulse 93   Temp 98.2 F (36.8 C) (Oral)   Resp 20   Ht 5' 10"  (1.778 m)   Wt 74.1 kg   SpO2 93%   BMI 23.43 kg/m  SpO2: SpO2: 93 % O2 Device: O2 Device: Room Air O2 Flow Rate: O2 Flow Rate (L/min): 2 L/min       Palliative Assessment/Data: 70%     Palliative Care Plan    Recommendations/Plan:  Will consult Chaplain for  completion of the Living Will and notarization  PMT will follow at a distance while he is hospitalized.   Please call us if we can be of more immediate assistance.  Recommend he seek rehab / professional counseling for cocaine addiction.  At this point Dhruv wishes to remain a full code but would not want long term life support (based on his family's experiences)  Code Status:  Full code  Prognosis:   Unable to determine.  He is at high risk for decompensation and readmission.   Discharge Planning:  Home with [place] was discussed with MD, Patient and sister.  Thank you for allowing the Palliative Medicine Team to assist in the care of this patient.  Total time spent:  25 min.     Greater than 50%  of this time was spent counseling and coordinating care related to the above assessment and plan.  Florentina Jenny, PA-C Palliative Medicine  Please contact Palliative MedicineTeam phone at [phone] for questions and concerns between 7 am - 7 pm.   Please see AMION for individual provider pager numbers.

## 2020-04-14 NOTE — Progress Notes (Signed)
Patient ID: Tyler Wong, male   DOB: [birth year], 54 y.o.   MRN: [id number]     Advanced Heart Failure Rounding Note  PCP-Cardiologist: Shelva Majestic, MD     Patient Profile   Mr Tyler Wong is a 54 year old with a history of CAD, DES to LAD in 2015, HTN, cocaine use, and chronic systolic heart failure. Admitted for [name] in the setting of poor med compliance and cocaine use (UDS+). Found to have low co-ox and hypotension and started on milrinone.   Echo this admit shows biventricular dysfunction. LVEF down to <20% (previously 40-45%). RV mildly reduce.   Subjective:    He remains off milrinone, co-ox 59% -> 43% this am. On recheck 55%    IV lasix restarted yesterday, CVP now 4-5. Creatinine 1.26 -> 1.45  Says he feels ok. Able to walk around room.   Denies SOB, orthopnea or PND. Appetite ok. LFTs normalizing.   Objective:   Weight Range: 74.1 kg Body mass index is 23.43 kg/m.   Vital Signs:   Temp:  [97.7 F (36.5 C)-99 F (37.2 C)] 99 F (37.2 C) (04/18 1220) Pulse Rate:  [52-101] 52 (04/18 1220) Resp:  [17-25] 20 (04/18 1220) BP: (91-124)/(58-97) 124/77 (04/18 1220) SpO2:  [93 %-100 %] 98 % (04/18 1220) Weight:  [74.1 kg] 74.1 kg (04/18 0426) Last BM Date: 04/13/20  Weight change: Filed Weights   04/12/20 0329 04/13/20 0027 04/14/20 0426  Weight: 75.3 kg 76.3 kg 74.1 kg    Intake/Output:   Intake/Output Summary (Last 24 hours) at 04/14/2020 1436 Last data filed at 04/14/2020 1203 Gross per 24 hour  Intake 600 ml  Output 4450 ml  Net -3850 ml      Physical Exam    CVP 4-5 General:  Sitting up in bed. No resp difficulty HEENT: normal Neck: supple. no JVD. Carotids 2+ bilat; no bruits. No lymphadenopathy or thryomegaly appreciated. Cor: PMI nondisplaced. Regular tachy. 2/6 MR at apex Lungs: clear Abdomen: soft, nontender, nondistended. No hepatosplenomegaly. No bruits or masses. Good bowel sounds. Extremities: no cyanosis, clubbing, rash, edema Neuro:  alert & orientedx3, cranial nerves grossly intact. moves all 4 extremities w/o difficulty. Affect pleasant  Telemetry   Sinus tach 100-105 Personally reviewed  EKG    No new EKG to review   Labs    CBC Recent Labs    04/12/20 0403  WBC 9.9  HGB 12.9*  HCT 39.1  MCV 84.3  PLT [id number]   Basic Metabolic Panel Recent Labs    04/13/20 0500 04/14/20 0422  NA 134* 136  K 4.2 4.7  CL 97* 100  CO2 29 29  GLUCOSE 144* 105*  BUN 26* 25*  CREATININE 1.36* 1.45*  CALCIUM 8.3* 8.8*  MG 1.9 1.8   Liver Function Tests Recent Labs    04/13/20 0500 04/14/20 0422  AST 37 35  ALT 122* 120*  ALKPHOS 103 101  BILITOT 1.0 1.3*  PROT 6.0* 7.0  ALBUMIN 2.4* 2.8*   No results for input(s): LIPASE, AMYLASE in the last 72 hours. Cardiac Enzymes No results for input(s): CKTOTAL, CKMB, CKMBINDEX, TROPONINI in the last 72 hours.  BNP: BNP (last 3 results) Recent Labs    05/04/19 1006 04/01/20 0629 04/09/20 1209  BNP 1,206.6* 773.7* 1,195.9*    ProBNP (last 3 results) Recent Labs    01/30/20 1645  PROBNP 1,759*     D-Dimer No results for input(s): DDIMER in the last 72 hours. Hemoglobin A1C Recent Labs  04/12/20 0403  HGBA1C 6.5*   Fasting Lipid Panel No results for input(s): CHOL, HDL, LDLCALC, TRIG, CHOLHDL, LDLDIRECT in the last 72 hours. Thyroid Function Tests No results for input(s): TSH, T4TOTAL, T3FREE, THYROIDAB in the last 72 hours.  Invalid input(s): FREET3  Other results:   Imaging    No results found.   Medications:     Scheduled Medications: . aspirin EC  81 mg Oral Daily  . atorvastatin  80 mg Oral Daily  . Chlorhexidine Gluconate Cloth  6 each Topical Daily  . digoxin  0.125 mg Oral Daily  . furosemide  80 mg Intravenous BID  . guaiFENesin  600 mg Oral BID  . heparin injection (subcutaneous)  5,000 Units Subcutaneous Q8H  . losartan  12.5 mg Oral QHS  . pantoprazole  40 mg Oral Daily  . polyethylene glycol  17 g Oral Daily  .  sodium chloride flush  10-40 mL Intracatheter Q12H  . sodium chloride flush  3 mL Intravenous Once  . spironolactone  25 mg Oral Daily    Infusions:   PRN Medications: antiseptic oral rinse, sodium chloride flush    Assessment/Plan   1. 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 Now with EF down to <20% with mild RV dysfunction. Cath in 10/19 showed 90% ostial OM1 and 70% PLV, no interventional target.  Suspect mixed ischemic/nonischemic cardiomyopathy.  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 Cocaine likely plays a role in his cardiomyopathy (actively using).  Developed cardiogenic shock this admit, improved on milrinone. Weaned off milrinone and co-ox 59% -> 43% this am. Then 55% on recheck. Back on IV lasix. CVP down to 4-5 - Will continue to follow off milrinone for now as not candidate for advanced therapies.  - Volume status better. Switch IV lasix to torsemide 40 bid - Continue digoxin 0.125 daily  - Continue spironolactone to 25 mg daily.  - Increase losartan to 25 daily. Consider ARNI soon - no  blocker w/ shock and low BP  - Not a candidate for advanced therapies or home milrinone with active use of cocaine.   - Long talk about him and his sister today as well as Palliative Care team. Explained that he is not candidate for advanced therapies or IV milrinone currently. Discussed ned for absolute abstinence from substance abuse. Hopefully we can stabilize off milrinone. If not, then will have to re-evaluate GOC 2. CAD: H/o anterior STEMI with DES to LAD in 7/15. As above, cath in 10/19 with 90% ostial OM1 and 70% PLV, no interventional target. Mildly elevated HS-TnI with no trend at admission, suspect demand ischemia from volume overload.  - Continue ASA 81 and atorvastatin 80 mg daily.  - No further CP  3. Cocaine abuse: Counseled to quit.  4. Active smoker:  Counseled to quit.  5. AKI: Creatinine now stabilized with hemodynamic support, 2.1>>1.8>>1.6>>1.5>>1.36 > 1.45.  Suspect cardiorenal syndrome.  6. Transaminitis - due to HF. Improving  Length of Stay: Capac, MD  04/14/2020, 2:36 PM  Advanced Heart Failure Team Pager [phone] (M-F; 7a - 4p)  Please contact [facility] for night-coverage after hours (4p -7a ) and weekends on [email or web address]

## 2020-04-14 NOTE — Progress Notes (Signed)
CO-OX order placed per verbal order from Dr. Haroldine Laws and obtained.

## 2020-04-14 NOTE — Progress Notes (Addendum)
PROGRESS NOTE  Tyler Wong IOE:[id number] DOB: 01-17-1966 DOA: 04/09/2020 PCP: Tyler Perna, NP  Tyler Wong is a 54 year old with a history of tobacco use, copd, CAD, DES to LAD in 2015, HTN, cocaine use, and chronic systolic heart failure. Admitted for [name] in the setting of poor med compliance and cocaine use (UDS+). Found to have low co-ox and hypotension and started on milrinone.   Echo this admit shows biventricular dysfunction. LVEF down to <20% (previously 40-45%). RV mildly reduce.   Initially presented to [facility] ED is complaining of short of breath ,bloating and one episode of vomiting Initial hemoglobin was 3.6, he received 1 unit PRBC in the ED , critical care consulted for admission CTA chest abdomen pelvis 04/09/2020-cardiac enlargement, coronary artery calcification, mild emphysema with pulmonary scarring.  No acute abdominal process. fobt negative Repeat cbc after one unit of prbc transfusion is 13.4 , so felt initial cbc result was false   HPI/Recap of past 24 hours:   Feeling better today, less sob, no chest pain, no hypoxia, no edema 3.4liter urine output last 24hrs, co-ox decreasing   Assessment/Plan: Active Problems:   Cocaine use   Acute combined systolic and diastolic congestive heart failure (HCC)   Symptomatic anemia   Cardiogenic shock (HCC)   CKD (chronic kidney disease) stage 3, GFR 30-59 ml/min   Palliative care encounter  Acute on chronic systolic CHF/severe Tyler/cardiomyopathy/cardiogenic shock- -Treated with miodarone drip which is discontinued on 4/16 pm after lactic acid normalization and improvement in coocx -on dig, spironolactone, losartan , lasix -no ? blocker w/ shock and low BP  -Not a candidate for advanced therapies or home milrinone with active use of cocaine. -feeling more sob on 4/17, feeling stomach tightness  , lasix dose increased to 69m bid per heart failure team -palliative care consulted per cardiology recommendation  -plan per Heart failure team   Hypokalemia/hyppmagnesemia: Replaced, improved, Repeat in am  CAD s/p DES LAD On asa, statin Denies chest pain  AKI on CKD aki likely due to cardiorenal syndrome Bun/cr 49/2.05 on presentation Improving, today bun/cr is 25/1.45  Elevated lft Likely from liver congestion  initial  cta ab showed "Hepatobiliary: Severe hepatic congestion findings with reflux of contrast down the IVC, into the dilated hepatic veins and into the adjacent lung parenchyma likely due to heart failure and or tricuspid regurgitation."  hepatitis panel panel + for hep c ab,  check hepc viral RNA  Impaired fasting blood glucose A.m. blood glucose 162-144  A1c 6.5 Carb modified diet  HTN Presented with cardiogenic shock BP improving, off milrinone drip BP stable on lasix/ spironolactone/ losartan  Substance abuse Active cocaine use, cessation education provided  Smoking cessation education provided   DVT Prophylaxis: Subcu heparin  Code Status: Full  Family Communication: patient and sister at bedside with permission on 4/18  Disposition Plan:    Patient came from:                      Home  Anticipated d/c place:  TBD  Barriers to d/c OR conditions which need to be met to effect a safe d/c:  Needs cardiology clearance   Consultants:  Critical care  Cardiology/heart failure team  Palliative care  Procedures:  PRBC transfusion x1 on admission  PICC line placement  Antibiotics:  None   Objective: BP 105/74   Pulse 93   Temp 98.2 F (36.8 C) (Oral)   Resp 20   Ht 5' 10"  (1.778 m)   Wt 74.1 kg   SpO2 93%   BMI 23.43 kg/m   Intake/Output Summary (Last 24 hours) at 04/14/2020 1020 Last data filed at 04/14/2020 0848 Gross per 24 hour  Intake 840 ml  Output 4550 ml  Net -3710 ml   Filed Weights   04/12/20 0329 04/13/20 0027 04/14/20 0426  Weight: 75.3  kg 76.3 kg 74.1 kg    Exam: Patient is examined daily including today on 04/14/2020, exams remain the same as of yesterday except that has changed    General:  NAD  Cardiovascular: RRR  Respiratory: diminished at basis  Abdomen: Soft/ND/NT, positive BS  Musculoskeletal: No Edema  Neuro: alert, oriented   Data Reviewed: Basic Metabolic Panel: Recent Labs  Lab 04/10/20 0611 04/10/20 1027 04/11/20 0500 04/11/20 1800 04/12/20 0403 04/13/20 0500 04/14/20 0422  NA 136   < > 138 135 134* 134* 136  K 6.0*   < > 3.7 3.8 3.8 4.2 4.7  CL 103   < > 95* 96* 97* 97* 100  CO2 18*   < > 30 29 30 29 29   GLUCOSE 105*   < > 134* 147* 162* 144* 105*  BUN 48*   < > 38* 35* 32* 26* 25*  CREATININE 1.94*   < > 1.81* 1.66* 1.52* 1.36* 1.45*  CALCIUM 8.6*   < > 8.7* 8.1* 7.9* 8.3* 8.8*  MG 2.1  --   --   --  1.5* 1.9 1.8   < > = values in this interval not displayed.   Liver Function Tests: Recent Labs  Lab 04/09/20 1342 04/11/20 0500 04/12/20 0403 04/13/20 0500 04/14/20 0422  AST 92* 67* 48* 37 35  ALT 321* 198* 139* 122* 120*  ALKPHOS 154* 115 99 103 101  BILITOT 2.3* 3.7* 1.1 1.0 1.3*  PROT 9.0* 6.4* 5.3* 6.0* 7.0  ALBUMIN 4.0 2.8* 2.2* 2.4* 2.8*   Recent Labs  Lab 04/09/20 1342 04/09/20 1745  LIPASE 178*  --   AMYLASE  --  152*   No results for input(s): AMMONIA in the last 168 hours. CBC: Recent Labs  Lab 04/09/20 1209 04/09/20 1209 04/09/20 1745 04/09/20 2108 04/10/20 0611 04/11/20 0500 04/12/20 0403  WBC 2.9*  --  11.1*  --  11.3* 10.2 9.9  NEUTROABS 2.3  --   --   --  8.7*  --   --   HGB 3.6*   < > 13.4 15.6 14.2 13.4 12.9*  HCT 12.1*   < > 42.3 46.1 43.0 40.6 39.1  MCV 91.0  --  87.8  --  85.3 83.4 84.3  PLT 253  --  232  --  233 226 220   < > = values in this interval not displayed.   Cardiac Enzymes:   No results for input(s): CKTOTAL, CKMB, CKMBINDEX, TROPONINI in the last 168 hours. BNP (last 3 results) Recent Labs    05/04/19 1006 04/01/20  0629 04/09/20 1209  BNP 1,206.6* 773.7* 1,195.9*    ProBNP (last 3 results)  Recent Labs    01/30/20 1645  PROBNP 1,759*    CBG: Recent Labs  Lab 04/10/20 1647  GLUCAP 118*    Recent Results (from the past 240 hour(s))  MRSA PCR Screening     Status: None   Collection Time: 04/09/20  1:21 AM   Specimen: Nasal Mucosa; Nasopharyngeal  Result Value Ref Range Status   MRSA by PCR NEGATIVE NEGATIVE Final    Comment:        The GeneXpert MRSA Assay (FDA approved for NASAL specimens only), is one component of a comprehensive MRSA colonization surveillance program. It is not intended to diagnose MRSA infection nor to guide or monitor treatment for MRSA infections. Performed at [facility], [place] [address], [place], [place] [address]   SARS CORONAVIRUS 2 (TAT 6-24 HRS) Nasopharyngeal Nasopharyngeal Swab     Status: None   Collection Time: 04/09/20  5:27 PM   Specimen: Nasopharyngeal Swab  Result Value Ref Range Status   SARS Coronavirus 2 NEGATIVE NEGATIVE Final    Comment: (NOTE) SARS-CoV-2 target nucleic acids are NOT DETECTED. The SARS-CoV-2 RNA is generally detectable in upper and lower respiratory specimens during the acute phase of infection. Negative results do not preclude SARS-CoV-2 infection, do not rule out co-infections with other pathogens, and should not be used as the sole basis for treatment or other patient management decisions. Negative results must be combined with clinical observations, patient history, and epidemiological information. The expected result is Negative. Fact Sheet for Patients: SugarRoll.be Fact Sheet for Healthcare Providers: https://www.woods-mathews.com/ This test is not yet approved or cleared by the [place] FDA and  has been authorized for detection and/or diagnosis of SARS-CoV-2 by FDA under an Emergency Use Authorization (EUA). This EUA will remain  in effect  (meaning this test can be used) for the duration of the COVID-19 declaration under Section 56 4(b)(1) of the Act, 21 U.S.C. section 360bbb-3(b)(1), unless the authorization is terminated or revoked sooner. Performed at [facility], [place] [address], [place], [place] [address]      Studies: No results found.  Scheduled Meds: . aspirin EC  81 mg Oral Daily  . atorvastatin  80 mg Oral Daily  . Chlorhexidine Gluconate Cloth  6 each Topical Daily  . digoxin  0.125 mg Oral Daily  . furosemide  80 mg Intravenous BID  . guaiFENesin  600 mg Oral BID  . heparin injection (subcutaneous)  5,000 Units Subcutaneous Q8H  . losartan  12.5 mg Oral QHS  . pantoprazole  40 mg Oral Daily  . polyethylene glycol  17 g Oral Daily  . sodium chloride flush  10-40 mL Intracatheter Q12H  . sodium chloride flush  3 mL Intravenous Once  . spironolactone  25 mg Oral Daily    Continuous Infusions:    Time spent: 3mns I have personally reviewed and interpreted on  04/14/2020 daily labs, tele strips, imagings as discussed above under date review session and assessment and plans.  I reviewed all nursing notes, pharmacy notes, consultant notes,  vitals, pertinent old records  I have discussed plan of care as described above with RN , patient and family on 04/14/2020   FFlorencia ReasonsMD, PhD, FACP  [facility]  Available via Epic secure chat 7am-7pm for nonurgent issues Please page for urgent issues, pager number available through [email or web address] .   04/14/2020, 10:20 AM  LOS: 5 days

## 2020-04-15 DIAGNOSIS — I5041 Acute combined systolic (congestive) and diastolic (congestive) heart failure: Secondary | ICD-10-CM | POA: Diagnosis not present

## 2020-04-15 LAB — BASIC METABOLIC PANEL
Anion gap: 9 (ref 5–15)
BUN: 33 mg/dL — ABNORMAL HIGH (ref 6–20)
CO2: 27 mmol/L (ref 22–32)
Calcium: 9.3 mg/dL (ref 8.9–10.3)
Chloride: 101 mmol/L (ref 98–111)
Creatinine, Ser: 1.52 mg/dL — ABNORMAL HIGH (ref 0.61–1.24)
GFR calc Af Amer: 59 mL/min — ABNORMAL LOW (ref >60)
GFR calc non Af Amer: 51 mL/min — ABNORMAL LOW (ref >60)
Glucose, Bld: 111 mg/dL — ABNORMAL HIGH (ref 70–99)
Potassium: 4.9 mmol/L (ref 3.5–5.1)
Sodium: 137 mmol/L (ref 135–145)

## 2020-04-15 LAB — COOXEMETRY PANEL
Carboxyhemoglobin: 0.9 % (ref 0.5–1.5)
Methemoglobin: 0.8 % (ref 0.0–1.5)
O2 Saturation: 50.3 %
Total hemoglobin: 14.9 g/dL (ref 12.0–16.0)

## 2020-04-15 LAB — MAGNESIUM: Magnesium: 2.4 mg/dL (ref 1.7–2.4)

## 2020-04-15 MED ORDER — TORSEMIDE 20 MG PO TABS: 40.0000 mg | ORAL_TABLET | Freq: Two times a day (BID) | ORAL | 1 refills | Status: DC

## 2020-04-15 MED ORDER — DIGOXIN 125 MCG PO TABS: 0.1250 mg | ORAL_TABLET | Freq: Every day | ORAL | 1 refills | Status: DC

## 2020-04-15 MED ORDER — SPIRONOLACTONE 25 MG PO TABS: 25.0000 mg | ORAL_TABLET | Freq: Every day | ORAL | 1 refills | Status: DC

## 2020-04-15 MED ORDER — ATORVASTATIN CALCIUM 80 MG PO TABS: 80.0000 mg | ORAL_TABLET | Freq: Every day | ORAL | 3 refills | Status: DC

## 2020-04-15 MED ORDER — LOSARTAN POTASSIUM 25 MG PO TABS: 25.0000 mg | ORAL_TABLET | Freq: Every day | ORAL | 1 refills | Status: DC

## 2020-04-15 MED ORDER — ASPIRIN 81 MG PO TBEC: 81.0000 mg | DELAYED_RELEASE_TABLET | Freq: Every day | ORAL | 1 refills | Status: DC

## 2020-04-15 MED FILL — SPIRONOLACTONE 25 MG TABS: 25 | 30 days supply | Qty: 30 | Fill #0

## 2020-04-15 MED FILL — DIGOXIN 0.125 MG TABLET: 125 | 30 days supply | Qty: 30 | Fill #0

## 2020-04-15 MED FILL — TORSEMIDE 20 MG TABLET: 20 | 15 days supply | Qty: 60 | Fill #0

## 2020-04-15 MED FILL — ATORVASTATIN 80 MG TABLET: 80 | 90 days supply | Qty: 90 | Fill #0

## 2020-04-15 MED FILL — LOSARTAN POTASSIUM 25 MG TA: 25 | 30 days supply | Qty: 30 | Fill #0

## 2020-04-15 NOTE — Progress Notes (Signed)
Patient discharged: Home with family  Via: Wheelchair   Discharge paperwork given: to patient and family  Reviewed with teach back  PICC and telemetry disconnected  Belongings given to patient  Losartan provided for tonight since pharmacy will be closed.

## 2020-04-15 NOTE — Progress Notes (Signed)
PROGRESS NOTE    Tyler Wong  [id number] DOB: 02/28/1966 DOA: 04/09/2020 PCP: [name], NP   Brief Narrative:  Patient is a 54 year old male with history of diabetes, COPD, coronary disease, hypertension, cocaine abuse, chronic systolic heart failure who was admitted for management of congestive heart failure in the setting of poor medical compliance, cocaine use.  He was hypotensive on presentation.  Hemoglobin found to be in the range of 3 on presentation but quickly improved after a unit of PRBC so suspected an error on lab.  He was initially admitted on PCCM service.   CHF team has been  following.  Started on milrinone.  Echo on this admission showed biventricular dysfunction, ejection fraction of less than 20%.  Assessment & Plan:   Active Problems:   Cocaine use   Acute combined systolic and diastolic congestive heart failure (HCC)   Symptomatic anemia   Cardiogenic shock (HCC)   CKD (chronic kidney disease) stage 3, GFR 30-59 ml/min   Palliative care encounter   Acute on chronic systolic CHF: CHF team following.  He was previously started on  milrinone drip but has been discontinued now after improvement. On digoxin, sotalol, losartan, Lasix.  Not on beta-blocker due to low blood pressure.  Not a candidate for advanced therapies or home milrinone due to active use of cocaine. Palliative care also consulted due to end-stage congestive heart failure.  He is full code.  Hypokalemia/hypomagnesemia: Being supplemented and monitored.  Coronary artery disease: Status post DES of LAD.  On aspirin, statin.  Denies any chest pain.  AKI on CKD stage 2 a: AKI likely associated with cardiorenal syndrome.  Kidney function stabilizing.  Baseline creatinine around 1.3.  Elevated LFTs: Likely from liver congestion.  Hepatitis panel positive hepatitis antibody.  Pending HCV RNA.  History of drug abuse.  Hypertension: Presented with cardiogenic shock.  BP improving.  Off  milrinone drip.  BP stable on Lasix, spinal lactone, losartan  Substance abuse: Counseled cessation.  Active cocaine abuse.  Also smokes.  Severe anemia on presentation: On presentation, his hemoglobin was in the range of 3.  Rapidly improved with a unit  of PRBC.  Started later on lab.  Hemoccult, iron studies are normal.         DVT prophylaxis:  heparin Code Status: Full Family Communication: None  Status is: Inpatient  Remains inpatient appropriate because:Unsafe d/c plan   Dispo: The patient is from: Home              Anticipated d/c is to: Home              Anticipated d/c date is: 2 days              Patient currently is not medically stable to d/c.  Needs cardiology clearance before discharge.       Consultants: Cardiology  Procedures:None  Antimicrobials:  Anti-infectives (From admission, onward)   None      Subjective: Patient seen and examined at the bedside this morning.  Hemodynamically stable.  Comfortable.  Denies any chest pain or shortness of breath.  Blood pressure soft.  Objective: Vitals:   04/15/20 0413 04/15/20 0414 04/15/20 0431 04/15/20 0829  BP:  92/64 105/68 93/74  Pulse:   93 80  Resp: (!) 27 (!) 22  16  Temp:  (!) 97.4 F (36.3 C) 98.3 F (36.8 C)   TempSrc:  Oral Oral   SpO2:   99% 100%  Weight:  Height:        Intake/Output Summary (Last 24 hours) at 04/15/2020 0946 Last data filed at 04/15/2020 0838 Gross per 24 hour  Intake 410 ml  Output 1750 ml  Net -1340 ml   Filed Weights   04/13/20 0027 04/14/20 0426 04/15/20 0104  Weight: 76.3 kg 74.1 kg 72.3 kg    Examination:  General exam: Comfortable HEENT:PERRL,Oral mucosa moist, Ear/Nose normal on gross exam Respiratory system: Bilateral equal air entry, normal vesicular breath sounds, no wheezes or crackles  Cardiovascular system: pansystolic murmur. No JVD, murmurs, rubs, gallops or clicks. No pedal edema. Gastrointestinal system: Abdomen is nondistended,  soft and nontender. No organomegaly or masses felt. Normal bowel sounds heard. Central nervous system: Alert and oriented. No focal neurological deficits. Extremities: No edema, no clubbing ,no cyanosis Skin: No rashes, lesions or ulcers,no icterus ,no pallor   Data Reviewed: I have personally reviewed following labs and imaging studies  CBC: Recent Labs  Lab 04/09/20 1209 04/09/20 1209 04/09/20 1745 04/09/20 2108 04/10/20 0611 04/11/20 0500 04/12/20 0403  WBC 2.9*  --  11.1*  --  11.3* 10.2 9.9  NEUTROABS 2.3  --   --   --  8.7*  --   --   HGB 3.6*   < > 13.4 15.6 14.2 13.4 12.9*  HCT 12.1*   < > 42.3 46.1 43.0 40.6 39.1  MCV 91.0  --  87.8  --  85.3 83.4 84.3  PLT 253  --  232  --  233 226 220   < > = values in this interval not displayed.   Basic Metabolic Panel: Recent Labs  Lab 04/10/20 0611 04/10/20 1027 04/11/20 1800 04/12/20 0403 04/13/20 0500 04/14/20 0422 04/15/20 0500  NA 136   < > 135 134* 134* 136 137  K 6.0*   < > 3.8 3.8 4.2 4.7 4.9  CL 103   < > 96* 97* 97* 100 101  CO2 18*   < > 29 30 29 29 27   GLUCOSE 105*   < > 147* 162* 144* 105* 111*  BUN 48*   < > 35* 32* 26* 25* 33*  CREATININE 1.94*   < > 1.66* 1.52* 1.36* 1.45* 1.52*  CALCIUM 8.6*   < > 8.1* 7.9* 8.3* 8.8* 9.3  MG 2.1  --   --  1.5* 1.9 1.8 2.4   < > = values in this interval not displayed.   GFR: Estimated Creatinine Clearance: 56.8 mL/min (A) (by C-G formula based on SCr of 1.52 mg/dL (H)). Liver Function Tests: Recent Labs  Lab 04/09/20 1342 04/11/20 0500 04/12/20 0403 04/13/20 0500 04/14/20 0422  AST 92* 67* 48* 37 35  ALT 321* 198* 139* 122* 120*  ALKPHOS 154* 115 99 103 101  BILITOT 2.3* 3.7* 1.1 1.0 1.3*  PROT 9.0* 6.4* 5.3* 6.0* 7.0  ALBUMIN 4.0 2.8* 2.2* 2.4* 2.8*   Recent Labs  Lab 04/09/20 1342 04/09/20 1745  LIPASE 178*  --   AMYLASE  --  152*   No results for input(s): AMMONIA in the last 168 hours. Coagulation Profile: No results for input(s): INR, PROTIME  in the last 168 hours. Cardiac Enzymes: No results for input(s): CKTOTAL, CKMB, CKMBINDEX, TROPONINI in the last 168 hours. BNP (last 3 results) Recent Labs    01/30/20 1645  PROBNP 1,759*   HbA1C: No results for input(s): HGBA1C in the last 72 hours. CBG: Recent Labs  Lab 04/10/20 1647  GLUCAP 118*   Lipid Profile: No results  for input(s): CHOL, HDL, LDLCALC, TRIG, CHOLHDL, LDLDIRECT in the last 72 hours. Thyroid Function Tests: No results for input(s): TSH, T4TOTAL, FREET4, T3FREE, THYROIDAB in the last 72 hours. Anemia Panel: No results for input(s): VITAMINB12, FOLATE, FERRITIN, TIBC, IRON, RETICCTPCT in the last 72 hours. Sepsis Labs: Recent Labs  Lab 04/10/20 1027 04/10/20 1615 04/10/20 2022 04/11/20 0017  LATICACIDVEN 5.4* 3.7* 3.9* 1.7    Recent Results (from the past 240 hour(s))  MRSA PCR Screening     Status: None   Collection Time: 04/09/20  1:21 AM   Specimen: Nasal Mucosa; Nasopharyngeal  Result Value Ref Range Status   MRSA by PCR NEGATIVE NEGATIVE Final    Comment:        The GeneXpert MRSA Assay (FDA approved for NASAL specimens only), is one component of a comprehensive MRSA colonization surveillance program. It is not intended to diagnose MRSA infection nor to guide or monitor treatment for MRSA infections. Performed at [facility], [place] [address], [place], [place] [address]   SARS CORONAVIRUS 2 (TAT 6-24 HRS) Nasopharyngeal Nasopharyngeal Swab     Status: None   Collection Time: 04/09/20  5:27 PM   Specimen: Nasopharyngeal Swab  Result Value Ref Range Status   SARS Coronavirus 2 NEGATIVE NEGATIVE Final    Comment: (NOTE) SARS-CoV-2 target nucleic acids are NOT DETECTED. The SARS-CoV-2 RNA is generally detectable in upper and lower respiratory specimens during the acute phase of infection. Negative results do not preclude SARS-CoV-2 infection, do not rule out co-infections with other pathogens, and should not be used  as the sole basis for treatment or other patient management decisions. Negative results must be combined with clinical observations, patient history, and epidemiological information. The expected result is Negative. Fact Sheet for Patients: SugarRoll.be Fact Sheet for Healthcare Providers: https://www.woods-mathews.com/ This test is not yet approved or cleared by the [place] FDA and  has been authorized for detection and/or diagnosis of SARS-CoV-2 by FDA under an Emergency Use Authorization (EUA). This EUA will remain  in effect (meaning this test can be used) for the duration of the COVID-19 declaration under Section 56 4(b)(1) of the Act, 21 U.S.C. section 360bbb-3(b)(1), unless the authorization is terminated or revoked sooner. Performed at [facility], [place] [address], [place], [place] [address]          Radiology Studies: No results found.      Scheduled Meds: . aspirin EC  81 mg Oral Daily  . atorvastatin  80 mg Oral Daily  . Chlorhexidine Gluconate Cloth  6 each Topical Daily  . digoxin  0.125 mg Oral Daily  . guaiFENesin  600 mg Oral BID  . heparin injection (subcutaneous)  5,000 Units Subcutaneous Q8H  . losartan  25 mg Oral QHS  . pantoprazole  40 mg Oral Daily  . polyethylene glycol  17 g Oral Daily  . sodium chloride flush  10-40 mL Intracatheter Q12H  . sodium chloride flush  3 mL Intravenous Once  . spironolactone  25 mg Oral Daily  . torsemide  40 mg Oral BID   Continuous Infusions:   LOS: 6 days    Time spent: 35 mins.More than 50% of that time was spent in counseling and/or coordination of care.      Shelly Coss, MD [facility] P4/19/2021, 9:46 AM

## 2020-04-15 NOTE — Discharge Summary (Signed)
Physician Discharge Summary  Tyler Wong [id number] DOB: Oct 17, 1966 DOA: 04/09/2020  PCP: [name], NP  Admit date: 04/09/2020 Discharge date: 04/15/2020  Admitted From: Home Disposition:  Home  Discharge Condition:Stable CODE STATUS:FULL Diet recommendation: Heart Healthy  Brief/Interim Summary:  Patient is a 54 year old male with history of diabetes, COPD, coronary disease, hypertension, cocaine abuse, chronic systolic heart failure who was admitted for management of congestive heart failure in the setting of poor medical compliance, cocaine use.  He was hypotensive on presentation.  Hemoglobin found to be in the range of 3 on presentation but quickly improved after a unit of PRBC so suspected an error on lab.  He was initially admitted on PCCM service.   CHF team has been  following.  Started on milrinone.  Echo on this admission showed biventricular dysfunction, ejection fraction of less than 20%.  Milrinone has been stopped.  Currently he is near euvolemic status.  Cardiology cleared him for discharge today.  Following problems were addressed during his hospitalization:  Acute on chronic systolic CHF: CHF team following.  He was previously started on  milrinone drip but has been discontinued now after improvement. Currently On digoxin, losartan, torsemide.  Not on beta-blocker due to low blood pressure.  Not a candidate for advanced therapies or home milrinone due to active use of cocaine. Palliative care also consulted due to end-stage congestive heart failure.  He remains full code.  Hypokalemia/hypomagnesemia: Being supplemented and monitored.  Coronary artery disease: Status post DES of LAD.  On aspirin, statin.  Denies any chest pain.  AKI on CKD stage 2 a: AKI likely associated with cardiorenal syndrome.  Kidney function stabilizing.  Baseline creatinine around 1.3.  Elevated LFTs: Likely from liver congestion.  Hepatitis panel positive hepatitis antibody.   Pending HCV RNA.  History of drug abuse.  Follow-up with infectious disease as an outpatient for treatment.  Hypertension: Presented with cardiogenic shock.  BP improving.  Off milrinone drip.  BP stable   Substance abuse: Counseled cessation.  UDS was positive for cocaine. Severe anemia on presentation: On presentation, his hemoglobin was in the range of 3.  Rapidly improved with a unit  of PRBC.  Suspected error on lab.  Hemoccult, iron studies are normal.   Discharge Diagnoses:  Active Problems:   Cocaine use   Acute combined systolic and diastolic congestive heart failure (HCC)   Symptomatic anemia   Cardiogenic shock (HCC)   CKD (chronic kidney disease) stage 3, GFR 30-59 ml/min   Palliative care encounter    Discharge Instructions  Discharge Instructions    Diet - low sodium heart healthy   Complete by: As directed    Discharge instructions   Complete by: As directed    1)Please take prescribed medications as instructed. 2)Follow up with cardiology as an outpatient on 04/25/2020 at 2:30 PM.   Increase activity slowly   Complete by: As directed      Allergies as of 04/15/2020   No Known Allergies     Medication List    STOP taking these medications   carvedilol 6.25 MG tablet Commonly known as: COREG   furosemide 40 MG tablet Commonly known as: LASIX   sacubitril-valsartan 24-26 MG Commonly known as: ENTRESTO     TAKE these medications   albuterol 108 (90 Base) MCG/ACT inhaler Commonly known as: VENTOLIN HFA Inhale 2 puffs into the lungs every 4 (four) hours as needed for wheezing or shortness of breath.   aspirin 81 MG EC tablet Take  1 tablet (81 mg total) by mouth daily.   atorvastatin 80 MG tablet Commonly known as: LIPITOR Take 1 tablet (80 mg total) by mouth daily.   digoxin 0.125 MG tablet Commonly known as: LANOXIN Take 1 tablet (0.125 mg total) by mouth daily. Start taking on: April 16, 2020 What changed: how much to take   docusate  sodium 100 MG capsule Commonly known as: COLACE Take 100 mg by mouth daily as needed for mild constipation.   losartan 25 MG tablet Commonly known as: COZAAR Take 1 tablet (25 mg total) by mouth at bedtime.   multivitamin tablet Take 1 tablet by mouth daily.   Nitrostat 0.4 MG SL tablet Generic drug: nitroGLYCERIN PLACE 1 TABLET UNDER TONGUE AS NEEDED FOR CHEST PAIN EVERY 5 MINUTES X 3 MAX DOSES.  CALL 911 IF PAIN PERSISTS What changed: See the new instructions.   omeprazole 20 MG capsule Commonly known as: PRILOSEC Take 1 capsule (20 mg total) by mouth daily. What changed:   when to take this  reasons to take this   spironolactone 25 MG tablet Commonly known as: ALDACTONE Take 1 tablet (25 mg total) by mouth daily. Start taking on: April 16, 2020 What changed: how much to take   torsemide 20 MG tablet Commonly known as: DEMADEX Take 2 tablets (40 mg total) by mouth 2 (two) times daily. Start taking on: April 16, 2020      Follow-up Information    [facility] .   Specialty: Radiology       [facility] Follow up on 04/25/2020.   Specialty: Cardiology Why: 2:30 PM [facility] Parking Garage Code 5009 Contact information: [address] [id number] [place] [phone]         No Known Allergies  Consultations:  Cardiology   Procedures/Studies: DG Chest 2 View  Result Date: 04/12/2020 CLINICAL DATA:  Cough. EXAM: CHEST - 2 VIEW COMPARISON:  04/09/2020. FINDINGS: PICC line noted with tip over SVC. Cardiomegaly. No pulmonary venous congestion. Bilateral interstitial prominence. CHF could present in this fashion. Small right pleural effusion. No pneumothorax. IMPRESSION: 1.  PICC line noted with tip over SVC. 2. Cardiomegaly. Bilateral interstitial prominence consistent interstitial edema. Small right pleural effusion. Findings consistent with CHF.  Electronically Signed   By: Marcello Moores  Register   On: 04/12/2020 13:46   DG CHEST PORT 1 VIEW  Result Date: 04/09/2020 CLINICAL DATA:  Shortness of breath EXAM: PORTABLE CHEST 1 VIEW COMPARISON:  04/09/2020 FINDINGS: Cardiac shadow is enlarged but stable. The lungs are well aerated bilaterally. No focal infiltrate or sizable effusion is seen. Mild areas of scarring in the right upper lobe are seen. No other focal abnormality is noted. IMPRESSION: No acute abnormality noted. Electronically Signed   By: Inez Catalina M.D.   On: 04/09/2020 20:14   DG Chest Port 1 View  Result Date: 04/09/2020 CLINICAL DATA:  Shortness of breath. EXAM: PORTABLE CHEST 1 VIEW COMPARISON:  04/01/2020 FINDINGS: Lungs are adequately inflated without focal airspace consolidation or effusion. Minimal stable scarring right upper lung. Stable cardiomegaly. Remainder the exam is unchanged. IMPRESSION: No acute cardiopulmonary disease. Stable cardiomegaly. Electronically Signed   By: Marin Olp M.D.   On: 04/09/2020 12:54   DG Chest Portable 1 View  Result Date: 04/01/2020 CLINICAL DATA:  Chest pain and shortness of breath for 2 days. EXAM: PORTABLE CHEST 1 VIEW COMPARISON:  05/04/2019 FINDINGS: Cardiac enlargement appears stable. Mild central vascular  congestion without pulmonary edema. No pleural effusions. No focal infiltrates. No worrisome pulmonary lesions. IMPRESSION: Stable cardiac enlargement and mild central vascular congestion. No edema, infiltrates or effusions. Electronically Signed   By: Marijo Sanes M.D.   On: 04/01/2020 05:26   ECHOCARDIOGRAM COMPLETE  Result Date: 04/10/2020    ECHOCARDIOGRAM REPORT   Patient Name:   Tyler Wong Date of Exam: 04/10/2020 Medical Rec #:  [id number]   Height:       70.0 in Accession #:    [id number]  Weight:       172.0 lb Date of Birth:  [birth year]  BSA:          1.958 m Patient Age:    62 years    BP:           120/101 mmHg Patient Gender: M           HR:           108 bpm. Exam  Location:  Inpatient Procedure: 2D Echo and 3D Echo STAT ECHO Indications:    Dyspnea 786.09 / R06.00  History:        Patient has prior history of Echocardiogram examinations, most                 recent 02/18/2020. CHF, CAD and Previous Myocardial Infarction,                 COPD; Risk Factors:Hypertension, Dyslipidemia and Current                 Smoker. Cocaine abuse.  Sonographer:    Darlina Sicilian RDCS Referring Phys: [id number] [place]  1. Left ventricular ejection fraction, by estimation, is <20%. The left ventricle has severely decreased function. The left ventricle demonstrates global hypokinesis. The left ventricular internal cavity size was moderately dilated. There is mild left ventricular hypertrophy. Left ventricular diastolic parameters are consistent with Grade II diastolic dysfunction (pseudonormalization).  2. Right ventricular systolic function is mildly reduced. The right ventricular size is moderately enlarged. There is moderately elevated pulmonary artery systolic pressure.  3. Left atrial size was moderately dilated.  4. Right atrial size was moderately dilated.  5. The mitral valve is normal in structure. Moderate mitral valve regurgitation. No evidence of mitral stenosis.  6. Tricuspid valve regurgitation is severe.  7. The aortic valve is normal in structure. Aortic valve regurgitation is not visualized. No aortic stenosis is present. FINDINGS  Left Ventricle: Left ventricular ejection fraction, by estimation, is <20%. The left ventricle has severely decreased function. The left ventricle demonstrates global hypokinesis. The left ventricular internal cavity size was moderately dilated. There is mild left ventricular hypertrophy. Left ventricular diastolic parameters are consistent with Grade II diastolic dysfunction (pseudonormalization). Right Ventricle: The right ventricular size is moderately enlarged. No increase in right ventricular wall thickness. Right ventricular  systolic function is mildly reduced. There is moderately elevated pulmonary artery systolic pressure. The tricuspid regurgitant velocity is 2.92 m/s, and with an assumed right atrial pressure of 15 mmHg, the estimated right ventricular systolic pressure is 123456 mmHg. Left Atrium: Left atrial size was moderately dilated. Right Atrium: Right atrial size was moderately dilated. Pericardium: Trivial pericardial effusion is present. Mitral Valve: The mitral valve is normal in structure. Moderate mitral valve regurgitation. No evidence of mitral valve stenosis. Tricuspid Valve: The tricuspid valve is normal in structure. Tricuspid valve regurgitation is severe. No evidence of tricuspid stenosis. Aortic Valve: The aortic valve is normal in structure. Aortic valve  regurgitation is not visualized. No aortic stenosis is present. Pulmonic Valve: The pulmonic valve was normal in structure. Pulmonic valve regurgitation is trivial. No evidence of pulmonic stenosis. Aorta: The aortic root and ascending aorta are structurally normal, with no evidence of dilitation. IAS/Shunts: The atrial septum is grossly normal.  LEFT VENTRICLE PLAX 2D LVOT diam:     2.00 cm      Diastology LV SV:         26           LV e' lateral:   7.87 cm/s LV SV Index:   14           LV E/e' lateral: 15.1 LVOT Area:     3.14 cm     LV e' medial:    3.89 cm/s                             LV E/e' medial:  30.6  LV Volumes (MOD) LV vol d, MOD A2C: 260.0 ml LV vol d, MOD A4C: 262.0 ml LV vol s, MOD A2C: 210.0 ml LV vol s, MOD A4C: 224.0 ml LV SV MOD A2C:     50.0 ml LV SV MOD A4C:     262.0 ml LV SV MOD BP:      41.0 ml RIGHT VENTRICLE         IVC TAPSE (M-mode): 1.6 cm  IVC diam: 2.90 cm LEFT ATRIUM              Index       RIGHT ATRIUM           Index LA Vol (A2C):   102.0 ml 52.10 ml/m RA Area:     31.30 cm LA Vol (A4C):   62.4 ml  31.87 ml/m RA Volume:   117.00 ml 59.76 ml/m LA Biplane Vol: 83.6 ml  42.70 ml/m  AORTIC VALVE LVOT Vmax:   67.50 cm/s LVOT  Vmean:  45.500 cm/s LVOT VTI:    0.084 m  AORTA Ao Root diam: 2.90 cm MITRAL VALVE                 TRICUSPID VALVE MV Area (PHT): 7.99 cm      TR Peak grad:   34.1 mmHg MV Decel Time: 95 msec       TR Vmax:        292.00 cm/s MR Peak grad:    75.0 mmHg MR Mean grad:    48.0 mmHg   SHUNTS MR Vmax:         433.00 cm/s Systemic VTI:  0.08 m MR Vmean:        322.0 cm/s  Systemic Diam: 2.00 cm MR PISA:         1.57 cm MR PISA Eff ROA: 15 mm MR PISA Radius:  0.50 cm MV E velocity: 119.00 cm/s MV A velocity: 105.00 cm/s MV E/A ratio:  1.13 Mertie Moores MD Electronically signed by Mertie Moores MD Signature Date/Time: 04/10/2020/12:54:30 PM    Final    CT Angio Chest/Abd/Pel for Dissection W and/or Wo Contrast  Result Date: 04/09/2020 CLINICAL DATA:  Chest pain, shortness of breath, severe anemia and abdominal pain. History of cocaine abuse. Evaluate for possible dissection or retroperitoneal bleed. EXAM: CT ANGIOGRAPHY CHEST, ABDOMEN AND PELVIS TECHNIQUE: Multidetector CT imaging through the chest, abdomen and pelvis was performed using the standard protocol during bolus administration of intravenous contrast. Multiplanar reconstructed images and  MIPs were obtained and reviewed to evaluate the vascular anatomy. CONTRAST:  168mL OMNIPAQUE IOHEXOL 350 MG/ML SOLN COMPARISON:  CT abdomen/pelvis 10/23/2017 and CT angio chest, FINDINGS: CTA CHEST FINDINGS Cardiovascular: Mild to moderate cardiac enlargement. Four-chamber enlargement but most notably the left ventricle. The aorta is normal in caliber. No dissection. The aortic branch vessels are patent. Minimal scattered atherosclerotic calcifications at the aortic arch. Fairly extensive/age advanced three-vessel coronary artery calcifications. Moderate reflux of contrast down the IVC and into dilated hepatic veins suggesting tricuspid regurgitation or some component of right heart failure. Mediastinum/Nodes: No mediastinal or hilar mass or lymphadenopathy. The esophagus  is grossly normal. Lungs/Pleura: Stable mild emphysematous changes and areas of pulmonary scarring. There are also some areas of mild bronchiectasis. I do not see any worrisome pulmonary lesions or pulmonary nodules. There is a small right-sided pleural effusion with minimal overlying atelectasis. Musculoskeletal: No significant bony findings. Review of the MIP images confirms the above findings. CTA ABDOMEN AND PELVIS FINDINGS VASCULAR Aorta: Normal Celiac: Normal SMA: Normal Renals: Normal IMA: Normal Inflow: Normal Veins: Normal Review of the MIP images confirms the above findings. NON-VASCULAR Hepatobiliary: Severe hepatic congestion findings with reflux of contrast down the IVC, into the dilated hepatic veins and into the adjacent lung parenchyma likely due to heart failure and or tricuspid regurgitation. Small cluster of calcifications in the right hepatic lobe. No worrisome hepatic lesions. No biliary dilatation. The gallbladder is grossly normal. No common bile duct dilatation. Pancreas: No mass, inflammation or ductal dilatation. Spleen: Normal size. No focal lesions. Adrenals/Urinary Tract: The adrenal glands and kidneys are grossly normal. The bladder is unremarkable. Stomach/Bowel: The stomach, duodenum, small bowel and colon are grossly normal without oral contrast. No acute inflammatory changes, mass lesions or obstructive findings. The terminal ileum and appendix are normal. Lymphatic: No mesenteric or retroperitoneal mass, adenopathy or hematoma. Reproductive: The prostate gland and seminal vesicles are unremarkable. Other: Mild mesenteric edema with a small amount of free abdominal/pelvic fluid. There is also body wall edema. Findings suggest anasarca are related to chronic CHF. Musculoskeletal: No significant bony findings. Review of the MIP images confirms the above findings. IMPRESSION: 1. No CT findings for aortic dissection or aneurysm. 2. Four-chamber enlargement but most notably the left  ventricle. 3. Age advanced three-vessel coronary artery calcifications. 4. Reflux of contrast down the IVC and into the dilated hepatic veins and into the adjacent hepatic parenchyma (hepatic congestion) likely due to heart failure and or tricuspid regurgitation. 5. Small right-sided pleural effusion and minimal overlying atelectasis. 6. Mild emphysematous changes and pulmonary scarring. 7. No acute abdominal/pelvic findings, mass lesions or adenopathy. 8. Emphysema and aortic atherosclerosis. Aortic Atherosclerosis (ICD10-I70.0) and Emphysema (ICD10-J43.9). Electronically Signed   By: Marijo Sanes M.D.   On: 04/09/2020 15:28   [place] EKG SITE RITE  Result Date: 04/10/2020 If Site Rite image not attached, placement could not be confirmed due to current cardiac rhythm.  US Abdomen Limited RUQ  Result Date: 04/11/2020 CLINICAL DATA:  Elevated liver function tests EXAM: ULTRASOUND ABDOMEN LIMITED RIGHT UPPER QUADRANT COMPARISON:  CT from 2 days ago FINDINGS: Gallbladder: No gallstones or wall thickening visualized. No sonographic Murphy sign noted by sonographer. Common bile duct: Diameter: 5 mm. Liver: Coarse shadowing calcification in the right lobe liver. Portal vein is patent on color Doppler imaging with normal direction of blood flow towards the liver. Other: Distended appearance of the IVC and hepatic veins. IMPRESSION: Distended hepatic veins and IVC with hepatic reflux on recent CTA, question passive  hepatic congestion as cause of lab abnormality. Electronically Signed   By: Monte Fantasia M.D.   On: 04/11/2020 06:00       Subjective: Patient seen and examined at the bedside this morning.  Hemodynamically stable for discharge today.  Discharge Exam: Vitals:   04/15/20 1303 04/15/20 1620  BP: 98/65 96/65  Pulse:    Resp: 20   Temp:    SpO2:     Vitals:   04/15/20 0829 04/15/20 1157 04/15/20 1303 04/15/20 1620  BP: 93/74 (!) 88/69 98/65 96/65   Pulse: 80 80    Resp: 16  20   Temp:   98.4 F (36.9 C)    TempSrc:  Oral    SpO2: 100% 98%    Weight:      Height:        General: Pt is alert, awake, not in acute distress Cardiovascular: RRR, S1/S2 +, no rubs, no gallops Respiratory: CTA bilaterally, no wheezing, no rhonchi Abdominal: Soft, NT, ND, bowel sounds + Extremities: no edema, no cyanosis    The results of significant diagnostics from this hospitalization (including imaging, microbiology, ancillary and laboratory) are listed below for reference.     Microbiology: Recent Results (from the past 240 hour(s))  MRSA PCR Screening     Status: None   Collection Time: 04/09/20  1:21 AM   Specimen: Nasal Mucosa; Nasopharyngeal  Result Value Ref Range Status   MRSA by PCR NEGATIVE NEGATIVE Final    Comment:        The GeneXpert MRSA Assay (FDA approved for NASAL specimens only), is one component of a comprehensive MRSA colonization surveillance program. It is not intended to diagnose MRSA infection nor to guide or monitor treatment for MRSA infections. Performed at [facility], [place] [address] [place], [place] [address]   SARS CORONAVIRUS 2 (TAT 6-24 HRS) Nasopharyngeal Nasopharyngeal Swab     Status: None   Collection Time: 04/09/20  5:27 PM   Specimen: Nasopharyngeal Swab  Result Value Ref Range Status   SARS Coronavirus 2 NEGATIVE NEGATIVE Final    Comment: (NOTE) SARS-CoV-2 target nucleic acids are NOT DETECTED. The SARS-CoV-2 RNA is generally detectable in upper and lower respiratory specimens during the acute phase of infection. Negative results do not preclude SARS-CoV-2 infection, do not rule out co-infections with other pathogens, and should not be used as the sole basis for treatment or other patient management decisions. Negative results must be combined with clinical observations, patient history, and epidemiological information. The expected result is Negative. Fact Sheet for  Patients: SugarRoll.be Fact Sheet for Healthcare Providers: https://www.woods-mathews.com/ This test is not yet approved or cleared by the [place] FDA and  has been authorized for detection and/or diagnosis of SARS-CoV-2 by FDA under an Emergency Use Authorization (EUA). This EUA will remain  in effect (meaning this test can be used) for the duration of the COVID-19 declaration under Section 56 4(b)(1) of the Act, 21 U.S.C. section 360bbb-3(b)(1), unless the authorization is terminated or revoked sooner. Performed at [facility], [place] [address] [place], [place] [address]      Labs: BNP (last 3 results) Recent Labs    05/04/19 1006 04/01/20 0629 04/09/20 1209  BNP 1,206.6* 773.7* [id number]*   Basic Metabolic Panel: Recent Labs  Lab 04/10/20 0611 04/10/20 1027 04/11/20 1800 04/12/20 0403 04/13/20 0500 04/14/20 0422 04/15/20 0500  NA 136   < > 135 134* 134* 136 137  K 6.0*   < > 3.8 3.8  4.2 4.7 4.9  CL 103   < > 96* 97* 97* 100 101  CO2 18*   < > 29 30 29 29 27   GLUCOSE 105*   < > 147* 162* 144* 105* 111*  BUN 48*   < > 35* 32* 26* 25* 33*  CREATININE 1.94*   < > 1.66* 1.52* 1.36* 1.45* 1.52*  CALCIUM 8.6*   < > 8.1* 7.9* 8.3* 8.8* 9.3  MG 2.1  --   --  1.5* 1.9 1.8 2.4   < > = values in this interval not displayed.   Liver Function Tests: Recent Labs  Lab 04/09/20 1342 04/11/20 0500 04/12/20 0403 04/13/20 0500 04/14/20 0422  AST 92* 67* 48* 37 35  ALT 321* 198* 139* 122* 120*  ALKPHOS 154* 115 99 103 101  BILITOT 2.3* 3.7* 1.1 1.0 1.3*  PROT 9.0* 6.4* 5.3* 6.0* 7.0  ALBUMIN 4.0 2.8* 2.2* 2.4* 2.8*   Recent Labs  Lab 04/09/20 1342 04/09/20 1745  LIPASE 178*  --   AMYLASE  --  152*   No results for input(s): AMMONIA in the last 168 hours. CBC: Recent Labs  Lab 04/09/20 1209 04/09/20 1209 04/09/20 1745 04/09/20 2108 04/10/20 0611 04/11/20 0500 04/12/20 0403  WBC 2.9*  --  11.1*  --  11.3*  10.2 9.9  NEUTROABS 2.3  --   --   --  8.7*  --   --   HGB 3.6*   < > 13.4 15.6 14.2 13.4 12.9*  HCT 12.1*   < > 42.3 46.1 43.0 40.6 39.1  MCV 91.0  --  87.8  --  85.3 83.4 84.3  PLT 253  --  232  --  233 226 220   < > = values in this interval not displayed.   Cardiac Enzymes: No results for input(s): CKTOTAL, CKMB, CKMBINDEX, TROPONINI in the last 168 hours. BNP: Invalid input(s): POCBNP CBG: Recent Labs  Lab 04/10/20 1647  GLUCAP 118*   D-Dimer No results for input(s): DDIMER in the last 72 hours. Hgb A1c No results for input(s): HGBA1C in the last 72 hours. Lipid Profile No results for input(s): CHOL, HDL, LDLCALC, TRIG, CHOLHDL, LDLDIRECT in the last 72 hours. Thyroid function studies No results for input(s): TSH, T4TOTAL, T3FREE, THYROIDAB in the last 72 hours.  Invalid input(s): FREET3 Anemia work up No results for input(s): VITAMINB12, FOLATE, FERRITIN, TIBC, IRON, RETICCTPCT in the last 72 hours. Urinalysis    Component Value Date/Time   COLORURINE YELLOW 04/09/2020 1347   APPEARANCEUR HAZY (A) 04/09/2020 1347   LABSPEC 1.018 04/09/2020 1347   PHURINE 5.0 04/09/2020 1347   GLUCOSEU NEGATIVE 04/09/2020 1347   HGBUR NEGATIVE 04/09/2020 1347   BILIRUBINUR NEGATIVE 04/09/2020 1347   KETONESUR NEGATIVE 04/09/2020 1347   PROTEINUR 100 (A) 04/09/2020 1347   NITRITE NEGATIVE 04/09/2020 1347   LEUKOCYTESUR NEGATIVE 04/09/2020 1347   Sepsis Labs Invalid input(s): PROCALCITONIN,  WBC,  LACTICIDVEN Microbiology Recent Results (from the past 240 hour(s))  MRSA PCR Screening     Status: None   Collection Time: 04/09/20  1:21 AM   Specimen: Nasal Mucosa; Nasopharyngeal  Result Value Ref Range Status   MRSA by PCR NEGATIVE NEGATIVE Final    Comment:        The GeneXpert MRSA Assay (FDA approved for NASAL specimens only), is one component of a comprehensive MRSA colonization surveillance program. It is not intended to diagnose MRSA infection nor to guide  or monitor treatment for MRSA infections. Performed  at [facility], [place] [address] [place], [place] [address]   SARS CORONAVIRUS 2 (TAT 6-24 HRS) Nasopharyngeal Nasopharyngeal Swab     Status: None   Collection Time: 04/09/20  5:27 PM   Specimen: Nasopharyngeal Swab  Result Value Ref Range Status   SARS Coronavirus 2 NEGATIVE NEGATIVE Final    Comment: (NOTE) SARS-CoV-2 target nucleic acids are NOT DETECTED. The SARS-CoV-2 RNA is generally detectable in upper and lower respiratory specimens during the acute phase of infection. Negative results do not preclude SARS-CoV-2 infection, do not rule out co-infections with other pathogens, and should not be used as the sole basis for treatment or other patient management decisions. Negative results must be combined with clinical observations, patient history, and epidemiological information. The expected result is Negative. Fact Sheet for Patients: SugarRoll.be Fact Sheet for Healthcare Providers: https://www.woods-mathews.com/ This test is not yet approved or cleared by the [place] FDA and  has been authorized for detection and/or diagnosis of SARS-CoV-2 by FDA under an Emergency Use Authorization (EUA). This EUA will remain  in effect (meaning this test can be used) for the duration of the COVID-19 declaration under Section 56 4(b)(1) of the Act, 21 U.S.C. section 360bbb-3(b)(1), unless the authorization is terminated or revoked sooner. Performed at [facility], [place] [address], [place], [place] [address]     Please note: You were cared for by a hospitalist during your hospital stay. Once you are discharged, your primary care physician will handle any further medical issues. Please note that NO REFILLS for any discharge medications will be authorized once you are discharged, as it is imperative that you return to your primary care physician (or establish a  relationship with a primary care physician if you do not have one) for your post hospital discharge needs so that they can reassess your need for medications and monitor your lab values.    Time coordinating discharge: 40 minutes  SIGNED:   Shelly Coss, MD  [facility] 04/15/2020, 5:07 PM Pager [phone]  If 7PM-7AM, please contact night-coverage [email or web address] Password [email or web address]

## 2020-04-15 NOTE — Progress Notes (Signed)
Outpatient CSW consulted to meet with pt and discuss being enrolled in [employer].  Pt had previously been on paramedicine program but was discharged after only a few visits due to lack of communication- pt is agreeable to rejoining at this time and feels motivated to work with [place] better moving forward.  Paramedicine Initial Assessment:  Housing:  In what kind of housing do you live? House/apt/trailer/shelter? house  Do you rent/pay a mortgage/own? Pt not currently paying anything as he has no source of income- depends on his long term girlfriend and his daughter to provide for him  Do you live with anyone? girlfriend  Are you currently worried about losing your housing? No- has been there on and off for 15 years  Within the past 12 months have you ever stayed outside, in a car, tent, a shelter, or temporarily with someone? no  Within the past 12 months have you been unable to get utilities when it was really needed? no  Social:  What is your current marital status? single  Do you have any children? 2 daughters- one in [place] and one in [place].   Food:  Within the past 12 months were you ever worried that food would run out before you got money to buy more? Sometimes  Within the past 50months have you run out of food and didn't have money to buy more? No  Gets food stamps (around $206/month) but since he will often buy and prepare food for not just himself those monies are sometimes stretched thin.  Reports they are aware of local food resources like pantries, however, and can access them if his food situation is ever dire.  Income:  What is your current source of income? None currently- had been set up with [facility] to apply months ago but did not make any of his appts so never completed the process.  Used to work as a Development worker, international aid and is hopeful that he might feel well enough to do some part time work in the future.  How hard is it for you to pay for  the basics like food housing, medical care, and utilities? Not very hard as his girlfriend and daughter work and pay for the home  Do you have outstanding medical bills? no  Insurance:  Are you currently insured?  Medicaid  Do you have prescription coverage?   Transportation:  Do you have transportation to your medical appointments? Yes   If yes, how? His daughter or girlfriend usually drive him to and from appts but if they are unavailable he takes the bus system which he states he is very familiar with.  CSW informed pt of Medicaid transport and how to get established if he is interested.  In the past 12 months has lack of transportation kept you from medical appts or from getting medications? no  In the past 12 months has lack of transportation kept you from meetings, work, or getting things you needed? no   Daily Health Needs:  Do you ever take your medications differently than prescribed? Reports sometimes forgetting to take his meds and has been adjusting his own lasix prior to admission as he thought he had extra fluid on him- this meant that he ran out of lasix early and he was out for 5-6 days prior to coming to hospital.  Do you have issues affording your medications? No- states not a problem now with medicaid.  If yes, has this ever prevented you from obtaining medications? no  Do you have any concerns with mobility at home? no  Do you use any assistive devices at home or have PCS at home? no  Do you have a PCP? Yes- Juluis Mire but states it has been some time since his last appt with her.   Are there any additional barriers you see to getting the care you need?  Pt current drug use is the only other barrier discussed at this time.  Was positive for cocaine on admission and pt admits to use but states it has been very infrequent recently maybe only using once a month.  Pt states he has been to rehab in the past and is not sure if he would like to get involved again  at this time.  Pt does not wish to discuss cessation options anymore at this time but is agreeable to discuss when he comes in for appt next week- want to get home and see how things go before getting anything set up.  CSW will continue to follow through paramedicine program and assist as needed.  [name], LCSW Clinical Social Worker [facility] Desk#: [phone] Cell#: [phone]

## 2020-04-15 NOTE — Progress Notes (Signed)
Pt stating that he is going home saying that he is told this per cardiology . Paged MD Amrit to confirm and for orders.

## 2020-04-15 NOTE — Progress Notes (Signed)
Chaplain provided education around Advanced Directive and had paperwork notarized.  Chaplain also explained spiritual support services offered.  Chaplain will follow-up with Tyler Wong as needed.

## 2020-04-15 NOTE — Progress Notes (Signed)
PICC removal: Site unremarkable. Pt instructed to remain flat in bed until 1800. Keep dressing dry and intact for 24 hours.

## 2020-04-15 NOTE — Progress Notes (Addendum)
Patient ID: Tyler Wong, male   DOB: 22-Oct-1966, 54 y.o.   MRN: [id number]     Advanced Heart Failure Rounding Note  PCP-Cardiologist: Shelva Majestic, MD     Patient Profile   Tyler Wong is a 54 year old with a history of CAD, DES to LAD in 2015, HTN, cocaine use, and chronic systolic heart failure. Admitted for [name] in the setting of poor med compliance and cocaine use (UDS+). Found to have low co-ox and hypotension and started on milrinone.   Echo this admit shows biventricular dysfunction. LVEF down to <20% (previously 40-45%). RV mildly reduce.   Subjective:    He remains off milrinone w/ low co-ox 50%, creatinine rising, 1.26>>1.45>>1.52  Despite hemodynamics, he states that he feels well. No dyspnea. No significant fatigue.   Volume status improved, CVP 6-7.   Variable BP readings this am w/ dynamap, SBP range 88-133. Diastolic range 99991111. RN to check manually.   Objective:   Weight Range: 72.3 kg Body mass index is 22.86 kg/m.   Vital Signs:   Temp:  [97.4 F (36.3 C)-99 F (37.2 C)] 98.4 F (36.9 C) (04/19 1157) Pulse Rate:  [46-114] 80 (04/19 1157) Resp:  [16-27] 16 (04/19 0829) BP: (88-133)/(64-110) 88/69 (04/19 1157) SpO2:  [96 %-100 %] 98 % (04/19 1157) Weight:  [72.3 kg] 72.3 kg (04/19 0104) Last BM Date: 04/13/20  Weight change: Filed Weights   04/13/20 0027 04/14/20 0426 04/15/20 0104  Weight: 76.3 kg 74.1 kg 72.3 kg    Intake/Output:   Intake/Output Summary (Last 24 hours) at 04/15/2020 1217 Last data filed at 04/15/2020 1210 Gross per 24 hour  Intake 530 ml  Output 2250 ml  Net -1720 ml      Physical Exam    CVP 6-7 General:  Well appearing AAM, Sitting up in bed. No resp difficulty HEENT: normal Neck: supple. no JVD. Carotids 2+ bilat; no bruits. No lymphadenopathy or thryomegaly appreciated. Cor: PMI nondisplaced. RRR 2/6 Tyler at apex Lungs: clear, no wheezing  Abdomen: soft, nontender, nondistended. No hepatosplenomegaly. No bruits  or masses. Good bowel sounds. Extremities: no cyanosis, clubbing, rash, edema Neuro: alert & orientedx3, cranial nerves grossly intact. moves all 4 extremities w/o difficulty. Affect pleasant  Telemetry   NSR/ sinus tach, 90s-low 100s, brief runs of NSVT 5 beats Personally reviewed  EKG    No new EKG to review   Labs    CBC No results for input(s): WBC, NEUTROABS, HGB, HCT, MCV, PLT in the last 72 hours. Basic Metabolic Panel Recent Labs    04/14/20 0422 04/15/20 0500  NA 136 137  K 4.7 4.9  CL 100 101  CO2 29 27  GLUCOSE 105* 111*  BUN 25* 33*  CREATININE 1.45* 1.52*  CALCIUM 8.8* 9.3  MG 1.8 2.4   Liver Function Tests Recent Labs    04/13/20 0500 04/14/20 0422  AST 37 35  ALT 122* 120*  ALKPHOS 103 101  BILITOT 1.0 1.3*  PROT 6.0* 7.0  ALBUMIN 2.4* 2.8*   No results for input(s): LIPASE, AMYLASE in the last 72 hours. Cardiac Enzymes No results for input(s): CKTOTAL, CKMB, CKMBINDEX, TROPONINI in the last 72 hours.  BNP: BNP (last 3 results) Recent Labs    05/04/19 1006 04/01/20 0629 04/09/20 1209  BNP 1,206.6* 773.7* 1,195.9*    ProBNP (last 3 results) Recent Labs    01/30/20 1645  PROBNP 1,759*     D-Dimer No results for input(s): DDIMER in the last 72 hours.  Hemoglobin A1C No results for input(s): HGBA1C in the last 72 hours. Fasting Lipid Panel No results for input(s): CHOL, HDL, LDLCALC, TRIG, CHOLHDL, LDLDIRECT in the last 72 hours. Thyroid Function Tests No results for input(s): TSH, T4TOTAL, T3FREE, THYROIDAB in the last 72 hours.  Invalid input(s): FREET3  Other results:   Imaging    No results found.   Medications:     Scheduled Medications: . aspirin EC  81 mg Oral Daily  . atorvastatin  80 mg Oral Daily  . Chlorhexidine Gluconate Cloth  6 each Topical Daily  . digoxin  0.125 mg Oral Daily  . guaiFENesin  600 mg Oral BID  . heparin injection (subcutaneous)  5,000 Units Subcutaneous Q8H  . losartan  25 mg Oral  QHS  . pantoprazole  40 mg Oral Daily  . polyethylene glycol  17 g Oral Daily  . sodium chloride flush  10-40 mL Intracatheter Q12H  . sodium chloride flush  3 mL Intravenous Once  . spironolactone  25 mg Oral Daily  . torsemide  40 mg Oral BID    Infusions:   PRN Medications: antiseptic oral rinse, sodium chloride flush    Assessment/Plan   1. 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 Now with EF down to <20% with mild RV dysfunction. Cath in 10/19 showed 90% ostial OM1 and 70% PLV, no interventional target.  Suspect mixed ischemic/nonischemic cardiomyopathy.  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 Cocaine likely plays a role in his cardiomyopathy (actively using).  Developed cardiogenic shock this admit, improved on milrinone, up to 72%. Weaned off milrinone on 4/16, co-ox low at 50%. Unfortunately, not a candidate for advanced therapies or home milrinone with active use of cocaine.   - despite hemodynamics, he states he feels pretty decent  - Volume status stable, CVP 6-7, he has diuresed 17 lb overall  - Continue torsemide 40 mg bid - Continue digoxin 0.125 daily  - Continue spironolactone to 25 mg daily.  - Continue losartan to 25 daily. Consider switch to [facility] if BP stable (will have RN check manual pressure) - no  blocker w/ shock/ low co-ox - Not a candidate for advanced therapies or home milrinone with active use of cocaine.   - Long talk w/ pt and sister this admit, as well as Palliative Care team. Explained that he is not candidate for advanced therapies or IV milrinone currently. Discussed need for absolute abstinence from substance abuse. Hopefully we can stabilize off milrinone. If not, then will have to re-evaluate GOC 2. CAD: H/o anterior STEMI with DES to LAD in 7/15. As above, cath in 10/19 with 90% ostial OM1 and 70% PLV, no interventional  target. Mildly elevated HS-TnI with no trend at admission, suspect demand ischemia from volume overload.  - Continue ASA 81 and atorvastatin 80 mg daily.  - No further CP  3. Cocaine abuse: Counseled to quit.  4. Active smoker: Counseled to quit.  5. AKI: Creatinine now trending back up off of milrinone, 1.36>>1.45>>1.52 (peaked at 2.05 this admit).    - Suspect cardiorenal syndrome.  6. Transaminitis - due to HF. Improving  Length of Stay: [address] [place]  04/15/2020, 12:17 PM  Advanced Heart Failure Team Pager [phone] (M-F; 7a - 4p)  Please contact [facility] for night-coverage after hours (4p -7a ) and weekends on [email or web address]  Patient seen with PA, agree  with the above note.    Co-ox remains marginal but not candidate for advanced therapies or home milrinone with active cocaine use.  He feels good and has walked up and down the hall twice.  CVP 7.  Creatinine mildly higher at 1.5.  SBP 90s-100s generally.   General: NAD Neck: No JVD, no thyromegaly or thyroid nodule.  Lungs: Clear to auscultation bilaterally with normal respiratory effort. CV: Lateral PMI.  Heart regular S1/S2, no S3/S4, no murmur.  No peripheral edema.   Abdomen: Soft, nontender, no hepatosplenomegaly, no distention.  Skin: Intact without lesions or rashes.  Neurologic: Alert and oriented x 3.  Psych: Normal affect. Extremities: No clubbing or cyanosis.  HEENT: Normal.   I think that he can go home today.  He will need close followup in CHF clinic and will arrange for paramedicine.  Meds for discharge: ASA 81, atorvastatin 80, digoxin 0.125 daily, losartan 25 daily, spironolactone 25 daily, torsemide 40 mg bid.   Loralie Champagne 04/15/2020 1:43 PM

## 2020-04-15 NOTE — Progress Notes (Signed)
CARDIAC REHAB PHASE I   PRE:  Rate/Rhythm: 90 SR    BP: sitting 90/78    SaO2:   MODE:  Ambulation: 940 ft   POST:  Rate/Rhythm: 103 ST    BP: sitting 96/76     SaO2:   Tolerated well, independent, no c/o. Eager to d/c. Discussed HF booklet and pt receptive, asking questions. Gave extra low sodium diets. [place], ACSM 04/15/2020 2:08 PM

## 2020-04-16 ENCOUNTER — Telehealth (HOSPITAL_COMMUNITY): Payer: Self-pay | Admitting: Licensed Clinical Social Worker

## 2020-04-16 ENCOUNTER — Encounter: Payer: Self-pay | Admitting: Cardiovascular Disease

## 2020-04-16 ENCOUNTER — Telehealth (HOSPITAL_COMMUNITY): Payer: Self-pay

## 2020-04-16 ENCOUNTER — Ambulatory Visit (INDEPENDENT_AMBULATORY_CARE_PROVIDER_SITE_OTHER): Payer: Medicaid Other | Admitting: Cardiovascular Disease

## 2020-04-16 ENCOUNTER — Other Ambulatory Visit: Payer: Self-pay

## 2020-04-16 ENCOUNTER — Telehealth: Payer: Self-pay

## 2020-04-16 DIAGNOSIS — I2510 Atherosclerotic heart disease of native coronary artery without angina pectoris: Secondary | ICD-10-CM | POA: Diagnosis not present

## 2020-04-16 DIAGNOSIS — I5043 Acute on chronic combined systolic (congestive) and diastolic (congestive) heart failure: Secondary | ICD-10-CM

## 2020-04-16 DIAGNOSIS — Z720 Tobacco use: Secondary | ICD-10-CM

## 2020-04-16 DIAGNOSIS — F1410 Cocaine abuse, uncomplicated: Secondary | ICD-10-CM

## 2020-04-16 DIAGNOSIS — I255 Ischemic cardiomyopathy: Secondary | ICD-10-CM

## 2020-04-16 DIAGNOSIS — I340 Nonrheumatic mitral (valve) insufficiency: Secondary | ICD-10-CM

## 2020-04-16 DIAGNOSIS — Z9861 Coronary angioplasty status: Secondary | ICD-10-CM

## 2020-04-16 LAB — HCV RNA QUANT
HCV Quantitative Log: 1.301 log10 IU/mL — ABNORMAL LOW (ref >1.70)
HCV Quantitative: 20 IU/mL — ABNORMAL LOW (ref >50)

## 2020-04-16 NOTE — Patient Instructions (Signed)
Medication Instructions:  IF YOU BEGIN TO FEEL DIZZY, BACK DOWN ON YOUR TORSEMIDE TO 40MG  IN THE MORNING AND 20MG  AT NIGHT OR CUT YOUR SPIRONOLACTONE IN HALF TO 12.5MG   OTHERWISE NO CHANGES *If you need a refill on your cardiac medications before your next appointment, please call your pharmacy*   Follow-Up: [place]

## 2020-04-16 NOTE — Telephone Encounter (Signed)
Spoke to pt regarding re-referral and he is wanting to be more involved with further care and states he will do better with staying in touch and keeping appointments.  He states he just getting home from doc office-his sister picked him up.  I tried to sch appointment tomor with him-he thinks he has appoint already tomor somewhere- I dont see anything for a [facility] provider-he is going to look over his paperwork in a little bit and call me back to let me know if he has something tomor or not.  I will call in the morning if I dont hear from him to f/u.   Marylouise Stacks, EMT-Paramedic  04/16/20

## 2020-04-16 NOTE — Telephone Encounter (Signed)
Transition Care Management Follow-up Telephone Call Date of discharge and from where: 04/15/2020, [facility]  Call placed to patient and he said that he just left his cardiology appointment and was waiting for his ride   He was in agreement to having this CM call him back tomorrow.

## 2020-04-16 NOTE — Progress Notes (Signed)
Patient ID: [name], male   DOB: Sep 29, 1966, 54 y.o.   MRN: [id number]     HPI: Tyler Wong is a 54 y.o. male who I last saw in February 2021 after not having seen him in almost 2-1/2 years.  He presents to the office today for follow-up evaluation and apparently was discharged from the hospital yesterday with acute on chronic heart failure  Tyler Wong is an African American male who denied any significant prior cardiac history and presented to [facility] in the middle of the night of 07/17/2014 with an ST segment elevation anterior wall myocardial infarction.  The patient had admitted to cocaine use the day before.  I performed emergent cardiac catheterization and he was found to have total proximal to mid LAD occlusion.  In a normal ramus intermediate, a dominant left circumflex, and a nondominant RCA.  There was mild to moderate acute left ventricular dysfunction involving the mid distal anterolateral wall and apex as well as distal inferoapical segment with initial acute ejection fraction of 45%.  He underwent successful DES stenting of his LAD and a Resolute integrity 3.5x34 mm stent was inserted, postdilated to 3.9 mm, tapering to 3.7, 8 mm.  Subsequently, he was started on low-dose ACE inhibitor in addition to beta blocker therapy and statin therapy.  He saw Ellen Henri for initial evaluation, and was doing well.  Since his initial MI, I only seen him once in the office in September 2015.  Subsequently he had  numerous readmissions for recurrent anginal symptoms and was admitted in January 2 hospitalizations with acute on chronic combined CHF.  He also again tested positive for cocaine.  An echo Doppler study showed an EF of 30-35% on 12/31/2016.  He was readmitted in late January with CHF exacerbation, and again has had several evaluations in June, July, and several in August.  I had last seen him in October 2018 at which time he still admitted to occasional cocaine use.  He was taking  furosemide up to 40 mg if he feels he is getting short of breath but has not been taking this daily.  He was also on atorvastatin 80 mg, carvedilol 3.125 mg twice a day, lisinopril 10 mg.  During that evaluation I had a lengthy discussion with he and his sister and discussed the vasospasm often associated with cocaine and potential for subsequent MI and death.  I initiated aldosterone blockade with spironolactone initially at 12.5 mg with plans to titrate up to twice a day.  Complete set of laboratory was recommended as well as a follow-up echo.  I have not seen Tyler Wong since the 2019 evaluation.  Unfortunately, he had immediately used cocaine although significantly less than previously.  He was hospitalized on several occasions and in October 2019 underwent right and left heart catheterization by Dr. Aundra Dubin which showed elevated left heart filling pressure with LVEDP at 26 mm.  He had preserved cardiac output.  He had primarily pulmonary venous hypertension.  It was 70% PLV stenosis of a small nondominant RCA and 90% ostial moderate OM1 stenosis.  His LAD was patent.  His most recent hospitalization was in  May 2020 when again he was readmitted with acute on chronic systolic and diastolic heart failure.  He was seen in the advanced heart failure clinic post hospitalization.  I had not seen Tyler Wong since October 2018 and recently saw him on January 30, 2020.  At that time he admitted to experiencing occasional shortness of breath and feels  that there is fluid and pressure in his stomach.  He states he rarely uses cocaine but admits to perhaps 1 time per month and he smokes a pack cigarettes every 4 days.  He was on atorvastatin 80 mg, carvedilol 3.125 twice a day, furosemide 40 mg twice a day, and low-dose Entresto 24/26 twice a day.   He last echo was during his May 2020 hospitalization which showed slightly improved LV function with EF 35 to 40%.  There was biatrial enlargement with left atrium and  moderate and right atrial mildly dilated.  There was moderate to severe mitral regurgitation noted at Doppler.  He had mild aortic valve sclerosis.  During that evaluation, he was no longer taking spironolactone.  His ventricular rate was elevated at 97 bpm.  I recommended addition of low-dose digoxin at 0.0625 mg daily as well as resuming spironolactone 12.5 mg daily.  After 1 week I recommended slight titration of carvedilol from 3.125 mg twice a day to a pill and a half twice a day and after several weeks to attempt further titration of 6.25 mg twice a day.  His blood pressure was too low to further titrate Entresto.  I discussed possible future use of [place] or Jardiance.  Amended he have a follow-up echo Doppler study which was done on February 09, 2020.  This now showed reduced LV function with EF 5 to 30% with global hypokinesis.  There was grade 3 diastolic dysfunction.  He had severe dilation of left ventricular internal cavity.  He had severe biatrial enlargement.  He was felt to have severe mitral regurgitation.  Tricuspid regurgitation most likely due to annular dilatation and lack of coaptation of the tricuspid valve leaflets.  Apparently, since I saw him he had run out of his furosemide which led to admission to the hospital on April 09, 2020 with exacerbation of heart failure.  He was treated with an initial milrinone drip with subsequently was discontinued.  He was felt not to be a candidate for advanced therapies or home milrinone due to his active use of cocaine.  Due to initial AKI, Entresto, carvedilol and furosemide were discontinued.  He was noted to have LFT elevation most likely from liver congestion.  He was followed by Dr. Marigene Ehlers.  He ultimately was discharged on aspirin 81 mg, atorvastatin 80 mg, digoxin 0.125 mg daily, losartan 25 mg, spironolactone 25 mg, as well as torsemide 40 mg twice a day.  He apparently had this old appointment scheduled and presents for evaluation the  following day but he is scheduled to see advanced heart failure clinic in several weeks.  Past Medical History:  Diagnosis Date  . CAD in native artery 07/17/14   STEMI- LAD stenosis with DES  . CHF (congestive heart failure) ([place])   . Cocaine abuse ([place])   . COPD (chronic obstructive pulmonary disease) ([place])   . Dyspnea   . Hypertension   . ST elevation myocardial infarction (STEMI) involving left anterior descending (LAD) coronary artery with complication ([place])   . Tobacco abuse 07/17/2014    Past Surgical History:  Procedure Laterality Date  . CORONARY ANGIOPLASTY WITH STENT PLACEMENT  07/18/14   resolute DES to LAD STEMI  . LEFT HEART CATH N/A 07/17/2014   Procedure: LEFT HEART CATH;  Surgeon: [name], MD;  Location: [facility] CATH LAB;  Service: Cardiovascular;  Laterality: N/A;  . PERCUTANEOUS CORONARY STENT INTERVENTION (PCI-S)  07/17/2014   Procedure: PERCUTANEOUS CORONARY STENT INTERVENTION (PCI-S);  Surgeon: Joyice Faster  Claiborne Billings, MD;  Location: [facility] CATH LAB;  Service: Cardiovascular;;  . RIGHT/LEFT HEART CATH AND CORONARY ANGIOGRAPHY N/A 10/03/2018   Procedure: RIGHT/LEFT HEART CATH AND CORONARY ANGIOGRAPHY;  Surgeon: [name], MD;  Location: [place] CV LAB;  Service: Cardiovascular;  Laterality: N/A;    No Known Allergies  Current Outpatient Medications  Medication Sig Dispense Refill  . albuterol (VENTOLIN HFA) 108 (90 Base) MCG/ACT inhaler Inhale 2 puffs into the lungs every 4 (four) hours as needed for wheezing or shortness of breath. 6.7 g 1  . aspirin 81 MG EC tablet Take 1 tablet (81 mg total) by mouth daily. 30 tablet 1  . atorvastatin (LIPITOR) 80 MG tablet Take 1 tablet (80 mg total) by mouth daily. 90 tablet 3  . digoxin (LANOXIN) 0.125 MG tablet Take 1 tablet (0.125 mg total) by mouth daily. 30 tablet 1  . docusate sodium (COLACE) 100 MG capsule Take 100 mg by mouth daily as needed for mild constipation.    Marland Kitchen losartan (COZAAR) 25 MG tablet Take 1 tablet (25  mg total) by mouth at bedtime. 30 tablet 1  . Multiple Vitamin (MULTIVITAMIN) tablet Take 1 tablet by mouth daily.    Marland Kitchen NITROSTAT 0.4 MG SL tablet PLACE 1 TABLET UNDER TONGUE AS NEEDED FOR CHEST PAIN EVERY 5 MINUTES X 3 MAX DOSES.  CALL 911 IF PAIN PERSISTS (Patient taking differently: Place 0.4 mg under the tongue every 5 (five) minutes as needed for chest pain. ) 25 tablet 3  . omeprazole (PRILOSEC) 20 MG capsule Take 1 capsule (20 mg total) by mouth daily. (Patient taking differently: Take 20 mg by mouth daily as needed (reflux/heartburn). ) 30 capsule 3  . spironolactone (ALDACTONE) 25 MG tablet Take 1 tablet (25 mg total) by mouth daily. 30 tablet 1  . torsemide (DEMADEX) 20 MG tablet Take 2 tablets (40 mg total) by mouth 2 (two) times daily. 60 tablet 1   No current facility-administered medications for this visit.    Social History   Socioeconomic History  . Marital status: Single    Spouse name: Not on file  . Number of children: 2  . Years of education: 57  . Highest education level: 12th grade  Occupational History  . Occupation: Landscaping  Tobacco Use  . Smoking status: Current Every Day Smoker    Packs/day: 0.50    Years: 30.00    Pack years: 15.00    Types: Cigarettes  . Smokeless tobacco: Never Used  . Tobacco comment: .5 PK PER DAY  Substance and Sexual Activity  . Alcohol use: No    Comment: Patient denies abuse, states social drinker  . Drug use: Not Currently    Types: Cocaine    Comment: heroin  . Sexual activity: Yes  Other Topics Concern  . Not on file  Social History Narrative   Lives in [place] with his sister and girlfriend. He works Aeronautical engineer work.    Social Determinants of Health   Financial Resource Strain:   . Difficulty of Paying Living Expenses:   Food Insecurity: Food Insecurity Present  . Worried About Charity fundraiser in the Last Year: Sometimes true  . Ran Out of Food in the Last Year: Never true  Transportation  Needs: No Transportation Needs  . Lack of Transportation (Medical): No  . Lack of Transportation (Non-Medical): No  Physical Activity:   . Days of Exercise per Week:   . Minutes of Exercise per Session:   Stress:   .  Feeling of Stress :   Social Connections:   . Frequency of Communication with Friends and Family:   . Frequency of Social Gatherings with Friends and Family:   . Attends Religious Services:   . Active Member of Clubs or Organizations:   . Attends Archivist Meetings:   Marland Kitchen Marital Status:   Intimate Partner Violence:   . Fear of Current or Ex-Partner:   . Emotionally Abused:   Marland Kitchen Physically Abused:   . Sexually Abused:     Family History  Problem Relation Age of Onset  . Cirrhosis Father   . Heart attack Mother   . Heart attack Sister   . Heart failure Sister   . Heart attack Brother     ROS General: Negative; No fevers, chills, or night sweats HEENT: Negative; No changes in vision or hearing, sinus congestion, difficulty swallowing Pulmonary: Negative; No cough, wheezing, shortness of breath, hemoptysis Cardiovascular: See history of present illness GI: Negative; No nausea, vomiting, diarrhea, or abdominal pain GU: Negative; No dysuria, hematuria, or difficulty voiding Musculoskeletal: Negative; no myalgias, joint pain, or weakness Hematologic: Negative; no easy bruising, bleeding Endocrine: Negative; no heat/cold intolerance; no diabetes, Neuro: Negative; no changes in balance, headaches Skin: Negative; No rashes or skin lesions Psychiatric: Positive for frequent dependence with cocaine Sleep: Negative; No snoring,  daytime sleepiness, hypersomnolence, bruxism, restless legs, hypnogognic hallucinations. Other comprehensive 14 point system review is negative    Physical Exam BP (!) 90/48 (BP Location: Left Arm, Patient Position: Sitting, Cuff Size: Normal)   Pulse 94   Ht 5' 10.5" (1.791 m)   Wt 161 lb (73 kg)   BMI 22.77 kg/m    Blood  pressure by me was low at 84/50 supine and 84/52 standing.  Wt Readings from Last 3 Encounters:  04/16/20 161 lb (73 kg)  04/15/20 159 lb 4.8 oz (72.3 kg)  04/01/20 179 lb 0.2 oz (81.2 kg)   General: Alert, oriented, no distress.  Skin: normal turgor, no rashes, warm and dry HEENT: Normocephalic, atraumatic. Pupils equal round and reactive to light; sclera anicteric; extraocular muscles intact;  Nose without nasal septal hypertrophy Mouth/Parynx benign; Mallinpatti scale 3 Neck: No JVD, no carotid bruits; normal carotid upstroke Lungs: clear to ausculatation and percussion; no wheezing or rales Chest wall: without tenderness to palpitation Heart: PMI not displaced, RRR, s1 s2 normal, 1/6 systolic murmur, no diastolic murmur, no rubs, gallops, thrills, or heaves Abdomen: soft, nontender; no hepatosplenomehaly, BS+; abdominal aorta nontender and not dilated by palpation. Back: no CVA tenderness Pulses 2+ Musculoskeletal: full range of motion, normal strength, no joint deformities Extremities: no clubbing cyanosis or edema, Homan's sign negative  Neurologic: grossly nonfocal; Cranial nerves grossly wnl Psychologic: Normal mood and affect   ECG (independently read by me): Sinus rhythm at 94 bpm with an isolated PVC.  Again biatrial enlargement.  Left anterior hemiblock.  Anterolateral infarct with QS complex V1 through V5.     ECG (independently read by me): Normal sinus rhythm with an isolated PAC.  Biatrial enlargement.  Left anterior hemiblock.  QS complex V1 through V5 consistent with old anterior MI.  QTc interval 497 ms  October 2018 ECG (independently read by me): Normal sinus rhythm.  Q waves V1 through V4 with T wave inversion across the precordium, concordant with his recent anterior wall myocardial infarction.  LABS: BMP Latest Ref Rng & Units 04/15/2020 04/14/2020 04/13/2020  Glucose 70 - 99 mg/dL 111(H) 105(H) 144(H)  BUN 6 - 20 mg/dL  33(H) 25(H) 26(H)  Creatinine 0.61 -  1.24 mg/dL 1.52(H) 1.45(H) 1.36(H)  BUN/Creat Ratio 9 - 20 - - -  Sodium 135 - 145 mmol/L 137 136 134(L)  Potassium 3.5 - 5.1 mmol/L 4.9 4.7 4.2  Chloride 98 - 111 mmol/L 101 100 97(L)  CO2 22 - 32 mmol/L 27 29 29   Calcium 8.9 - 10.3 mg/dL 9.3 8.8(L) 8.3(L)   Hepatic Function Latest Ref Rng & Units 04/14/2020 04/13/2020 04/12/2020  Total Protein 6.5 - 8.1 g/dL 7.0 6.0(L) 5.3(L)  Albumin 3.5 - 5.0 g/dL 2.8(L) 2.4(L) 2.2(L)  AST 15 - 41 U/L 35 37 48(H)  ALT 0 - 44 U/L 120(H) 122(H) 139(H)  Alk Phosphatase 38 - 126 U/L 101 103 99  Total Bilirubin 0.3 - 1.2 mg/dL 1.3(H) 1.0 1.1  Bilirubin, Direct 0.0 - 0.2 mg/dL 0.4(H) 0.3(H) 0.5(H)   CBC Latest Ref Rng & Units 04/12/2020 04/11/2020 04/10/2020  WBC 4.0 - 10.5 K/uL 9.9 10.2 11.3(H)  Hemoglobin 13.0 - 17.0 g/dL 12.9(L) 13.4 14.2  Hematocrit 39.0 - 52.0 % 39.1 40.6 43.0  Platelets 150 - 400 K/uL 220 226 233   Lab Results  Component Value Date   MCV 84.3 04/12/2020   MCV 83.4 04/11/2020   MCV 85.3 04/10/2020   Lab Results  Component Value Date   TSH 1.920 01/30/2020   Lipid Panel     Component Value Date/Time   CHOL 191 12/31/2016 0323   TRIG 125 12/31/2016 0323   HDL 54 12/31/2016 0323   CHOLHDL 3.5 12/31/2016 0323   VLDL 25 12/31/2016 0323   LDLCALC 112 (H) 12/31/2016 0323   RADIOLOGY: No results found.  ECHO 02/09/2020 IMPRESSIONS  1. Left ventricular ejection fraction, by estimation, is 25-30%. The left  ventricle has severely decreased function. The left ventricle demonstrates  global hypokinesis, best preserved function in lateral wall. The left  ventricular internal cavity size  was severely dilated. Left ventricular diastolic parameters are consistent  with Grade III diastolic dysfunction (restrictive). Elevated left  ventricular end-diastolic pressure.  2. Right ventricular systolic function is mildly reduced. The right  ventricular size is moderately enlarged. There is moderate-severely  elevated pulmonary artery  systolic pressure. The estimated right  ventricular systolic pressure is 57.0 mmHg.  3. Left atrial size was severely dilated.  4. Right atrial size was severely dilated.  5. Tenting of mitral valve leaflets due to LV dysfunction. The mitral  valve is grossly normal. Severe mitral valve regurgitation. No evidence of  mitral stenosis.  6. Mechanism of TR likely annular dilation, lack of coaptation of  tricuspid valve leaflets. Tricuspid valve regurgitation is severe.  7. The aortic valve is tricuspid. Aortic valve regurgitation is trivial .  No aortic stenosis is present.  8. The inferior vena cava is dilated in size with <50% respiratory  variability, suggesting right atrial pressure of 15 mmHg.   Comparison(s): A prior study was performed on 05/05/2019. Prior images  reviewed side by side. LVEF has decreased. MR and TR have increased and  are now severe.   ECHO 04/10/2020 IMPRESSIONS  1. Left ventricular ejection fraction, by estimation, is <20%. The left  ventricle has severely decreased function. The left ventricle demonstrates  global hypokinesis. The left ventricular internal cavity size was  moderately dilated. There is mild left  ventricular hypertrophy. Left ventricular diastolic parameters are  consistent with Grade II diastolic dysfunction (pseudonormalization).  2. Right ventricular systolic function is mildly reduced. The right  ventricular size is moderately enlarged. There is  moderately elevated  pulmonary artery systolic pressure.  3. Left atrial size was moderately dilated.  4. Right atrial size was moderately dilated.  5. The mitral valve is normal in structure. Moderate mitral valve  regurgitation. No evidence of mitral stenosis.  6. Tricuspid valve regurgitation is severe.  7. The aortic valve is normal in structure. Aortic valve regurgitation is  not visualized. No aortic stenosis is present.   IMPRESSION:  No diagnosis found.  ASSESSMENT AND  PLAN: Tyler Wong is a 54 year old African-American male who suffered an anterior wall myocardial infarction most likely contributed by cocaine use in July 2015.  He underwent successful DES stenting to a totally occluded LAD.  He has subsequently undergone cardiac catheterization which has shown his LAD to be patent and he had 70% stenosis in the PLV branch of a nondominant RCA and 90% ostial moderate OM1 stenosis at catheterization by Dr. Aundra Dubin in 2019 for which medical therapy was recommended.  Unfortunately, he had continued to use cocaine over the last several years.  He now admits that uses approximately 1 time per month.  I have not seen him since my evaluation in the office in October 2018.  When I saw him on January 30, 2020 physical exam suggested a murmur of mitral regurgitation.  I recommended that he have a repeat echo Doppler study.  Evaluation I made mild medication adjustments.  He apparently did not have his echo and unfortunately ran out of his furosemide leading to admission to [facility] on April 13 through April 19 with acute on chronic systolic heart failure exacerbation.  While hospitalized a follow-up echo Doppler study now showed an EF of less than 20%.  There was moderate mitral regurgitation.  Left ventricular internal cavity size was moderately dilated.  During his hospitalization, Entresto furosemide and carvedilol were discontinued he was started on low-dose losartan and discharged on torsemide 40 mg twice a day in addition to spironolactone 25 mg daily.  His blood pressure today is low at 84/50 without orthostatic change.  His last use of cocaine was 3 weeks previously.  During this evaluation I had recommended reduction of his torsemide dose particularly with his low blood pressure but he did not feel comfortable doing that since it had just been increased yesterday by the heart failure team.  As result with his low blood pressure I have suggested slight reduction of  spironolactone to 12.5 mg daily.  He is to continue his additional medications and keep his scheduled appointment with advanced heart failure clinic following his hospitalization.  LV function will need to be followed and if he continues have significant MR as long as there is not continued significant annular dilatation discussion regarding potential candidacy for mitral valve clip consideration may be considered.   [name], MD, [facility]  04/16/2020 2:24 PM

## 2020-04-16 NOTE — Telephone Encounter (Signed)
CSW called pt to check in after hospital DC yesterday.  Pt reports he is feeling ok- was unable to pick up his medications last night after DC as it was closed but reports picking up all his medications this morning and has already taken them.  CSW informed pt that Joellen Jersey would be his paramedic and reminded him to look out for her phone call- also informed pt she would be bringing out [facility] application forms for him to sign so we can place that referral for him- pt agreeable.  CSW also discussed need to get reestablished with his PCP- pt agreeable to [place] getting him an appt- appt set for May 11th and 9:30am and CSW texted pt the details.  CSW will continue to follow and assist as needed  [name], [facility] Desk#: [phone] Cell#: [phone]

## 2020-04-17 ENCOUNTER — Other Ambulatory Visit (HOSPITAL_COMMUNITY): Payer: Self-pay

## 2020-04-17 ENCOUNTER — Telehealth: Payer: Self-pay

## 2020-04-17 NOTE — Telephone Encounter (Signed)
Transition Care Management Follow-up Telephone Call  Date of discharge and from where: 04/15/2020, [facility]   How have you been since you were released from the hospital? He said he is " doing fine, as good as to be expected." Marylouise Stacks.EMT just left his home.   Any questions or concerns?none at this time  Items Reviewed:  Did the pt receive and understand the discharge instructions provided? yes   Medications obtained and verified? he said that [facility], EMT was very thorough and reviewed everything with him.  No questions about meds at this time.   Any new allergies since your discharge?  none reported   Do you have support at home?  yes  Other (ie: DME, Home Health, etc) receives community paramedicine services - Katie Lynch,EMT  Functional Questionnaire: (I = Independent and D = Dependent) ADL's: independent   Follow up appointments reviewed:    PCP Hospital f/u appt confirmed? appoinmtent at [facility] 05/07/2020 @ 0930.  He did not want to schedule an appointment any sooner.   [facility] f/u appt confirmed? .he saw the cardiologist yesterday, has appointment at heart and vascular 04/25/2020  Are transportation arrangements needed?  no, he has someone to drive him to appointments.  Provided him with the phone number to call medicaid to register for transportation.   If their condition worsens, is the pt aware to call  their PCP or go to the ED? yes  Was the patient provided with contact information for the PCP's office or ED? yes  Was the pt encouraged to call back with questions or concerns?  yes

## 2020-04-17 NOTE — Progress Notes (Signed)
Paramedicine Encounter    Patient ID: Tyler Wong, male    DOB: 1966/05/29, 54 y.o.   MRN: [id number]   Patient Care Team: [name], NP as PCP - General (Internal Medicine) [name], MD as PCP - Cardiology (Cardiology) [name], MD as PCP - Advanced Heart Failure (Cardiology) [name], LCSW as Social Worker (Licensed Clinical Social Worker)  Patient Active Problem List   Diagnosis Date Noted  . Palliative care encounter   . Cardiogenic shock ([place]) 04/11/2020  . CKD (chronic kidney disease) stage 3, GFR 30-59 ml/min 04/11/2020  . Symptomatic anemia 04/09/2020  . Acute on chronic HFrEF (heart failure with reduced ejection fraction) ([place]) 05/05/2019  . HFrEF (heart failure with reduced ejection fraction) ([place]) 05/04/2019  . Acute combined systolic and diastolic congestive heart failure ([place]) 10/02/2018  . Hepatitis C 04/13/2017  . Current non-adherence to medical treatment 04/13/2017  . Acute on chronic combined systolic (congestive) and diastolic (congestive) heart failure ([place]) 04/13/2017  . HTN (hypertension) 04/12/2017  . Insomnia 01/18/2017  . Chronic combined systolic and diastolic congestive heart failure ([place]) 01/04/2017  . Hyperlipidemia 01/04/2017  . Chest pain 12/30/2016  . CAD S/P percutaneous coronary angioplasty 07/18/2014  . Tobacco abuse 07/17/2014  . Cocaine use 07/17/2014    Current Outpatient Medications:  .  aspirin 81 MG EC tablet, Take 1 tablet (81 mg total) by mouth daily., Disp: 30 tablet, Rfl: 1 .  atorvastatin (LIPITOR) 80 MG tablet, Take 1 tablet (80 mg total) by mouth daily., Disp: 90 tablet, Rfl: 3 .  digoxin (LANOXIN) 0.125 MG tablet, Take 1 tablet (0.125 mg total) by mouth daily., Disp: 30 tablet, Rfl: 1 .  docusate sodium (COLACE) 100 MG capsule, Take 100 mg by mouth daily as needed for mild constipation., Disp: , Rfl:  .  losartan (COZAAR) 25 MG tablet, Take 1 tablet (25 mg total) by mouth at bedtime. (Patient taking  differently: Take 25 mg by mouth at bedtime. ), Disp: 30 tablet, Rfl: 1 .  Multiple Vitamin (MULTIVITAMIN) tablet, Take 1 tablet by mouth daily., Disp: , Rfl:  .  omeprazole (PRILOSEC) 20 MG capsule, Take 1 capsule (20 mg total) by mouth daily. (Patient taking differently: Take 20 mg by mouth daily as needed (reflux/heartburn). ), Disp: 30 capsule, Rfl: 3 .  spironolactone (ALDACTONE) 25 MG tablet, Take 1 tablet (25 mg total) by mouth daily., Disp: 30 tablet, Rfl: 1 .  torsemide (DEMADEX) 20 MG tablet, Take 2 tablets (40 mg total) by mouth 2 (two) times daily., Disp: 60 tablet, Rfl: 1 .  albuterol (VENTOLIN HFA) 108 (90 Base) MCG/ACT inhaler, Inhale 2 puffs into the lungs every 4 (four) hours as needed for wheezing or shortness of breath. (Patient not taking: Reported on 04/17/2020), Disp: 6.7 g, Rfl: 1 .  NITROSTAT 0.4 MG SL tablet, PLACE 1 TABLET UNDER TONGUE AS NEEDED FOR CHEST PAIN EVERY 5 MINUTES X 3 MAX DOSES.  CALL 911 IF PAIN PERSISTS (Patient not taking: No sig reported), Disp: 25 tablet, Rfl: 3 No Known Allergies    Social History   Socioeconomic History  . Marital status: Single    Spouse name: Not on file  . Number of children: 2  . Years of education: 36  . Highest education level: 12th grade  Occupational History  . Occupation: Landscaping  Tobacco Use  . Smoking status: Current Every Day Smoker    Packs/day: 0.50    Years: 30.00    Pack years: 15.00  Types: Cigarettes  . Smokeless tobacco: Never Used  . Tobacco comment: .5 PK PER DAY  Substance and Sexual Activity  . Alcohol use: No    Comment: Patient denies abuse, states social drinker  . Drug use: Not Currently    Types: Cocaine    Comment: heroin  . Sexual activity: Yes  Other Topics Concern  . Not on file  Social History Narrative   Lives in [place] with his sister and girlfriend. He works Aeronautical engineer work.    Social Determinants of Health   Financial Resource Strain:   . Difficulty  of Paying Living Expenses:   Food Insecurity: Food Insecurity Present  . Worried About Charity fundraiser in the Last Year: Sometimes true  . Ran Out of Food in the Last Year: Never true  Transportation Needs: No Transportation Needs  . Lack of Transportation (Medical): No  . Lack of Transportation (Non-Medical): No  Physical Activity:   . Days of Exercise per Week:   . Minutes of Exercise per Session:   Stress:   . Feeling of Stress :   Social Connections:   . Frequency of Communication with Friends and Family:   . Frequency of Social Gatherings with Friends and Family:   . Attends Religious Services:   . Active Member of Clubs or Organizations:   . Attends Archivist Meetings:   Marland Kitchen Marital Status:   Intimate Partner Violence:   . Fear of Current or Ex-Partner:   . Emotionally Abused:   Marland Kitchen Physically Abused:   . Sexually Abused:     Physical Exam      Future Appointments  Date Time Provider [place]  04/25/2020  2:30 PM [facility] PA/NP [facility] None  05/07/2020  9:30 AM [name], NP [facility] None    BP 94/76   Pulse (!) 102   Temp 97.9 F (36.6 C)   Resp 16   Wt 158 lb (71.7 kg)   SpO2 95%   BMI 22.35 kg/m   Weight yesterday-161   Pt re-referred to paramedicine. This is another first initial home visit.  He said someone called him this morning from a community care but he wasn't familiar with them and he cant remember the name of agency or who called-he said they would be sending him information soon.  He states he is going to try to not use cocaine-he states he used socially and has a strong family support system in place to help him avoid using. Offered our support as well.  Pt denies c/p.   Tyler Wong is working with him for [employer] application/disability. He signed paper for [facility] to get their assistance as well.   [facility]  meds verified--he does not have the ventolin inhaler.  No asa bottle but states he  takes it.  Spiro bottle showing 1/2 tab directions so will need to make sure pharmacy has full tab instructions when he needs it refilled.   He has pill box set up for his AM dose of meds for the whole 4 rows and he takes his 2nd dose of torsemide from bottle he takes everything else in the morning.  Torsemide rx was only written for 15day supply so at his next clinic visit that will have to be sent in for 30days.  He was d/c the other day. He states his breathing is doing much better.  Denies sob.  He states he feels really thirsty all the time and has dry  mouth. Suggested sugar free hard candy to try.  Or try the dry mouth rinse to see if that helps. Will see him at clinic next week for f/u.    Marylouise Stacks, [facility] Paramedic  04/17/20

## 2020-04-19 ENCOUNTER — Telehealth (HOSPITAL_COMMUNITY): Payer: Self-pay | Admitting: Licensed Clinical Social Worker

## 2020-04-19 NOTE — Telephone Encounter (Signed)
Referral to [facility] for assistance with disability completed and turned in for review- hopeful they will assist pt in completing disability process.  CSW will continue to follow and assist as needed  [name], [facility] Desk#: [phone] Cell#: [phone]

## 2020-04-23 ENCOUNTER — Encounter: Payer: Self-pay | Admitting: Cardiovascular Disease

## 2020-04-25 ENCOUNTER — Other Ambulatory Visit: Payer: Self-pay

## 2020-04-25 ENCOUNTER — Other Ambulatory Visit (HOSPITAL_COMMUNITY): Payer: Self-pay

## 2020-04-25 ENCOUNTER — Telehealth (HOSPITAL_COMMUNITY): Payer: Self-pay

## 2020-04-25 ENCOUNTER — Encounter (HOSPITAL_COMMUNITY): Payer: Self-pay

## 2020-04-25 ENCOUNTER — Ambulatory Visit (HOSPITAL_COMMUNITY)
Admit: 2020-04-25 | Discharge: 2020-04-25 | Disposition: A | Discharge status: Home / Self Care | Payer: Medicaid Other | Source: Ambulatory Visit | Attending: Cardiology | Admitting: Cardiology

## 2020-04-25 VITALS — BP 110/80 | HR 100 | Wt 155.2 lb

## 2020-04-25 DIAGNOSIS — Z955 Presence of coronary angioplasty implant and graft: Secondary | ICD-10-CM | POA: Insufficient documentation

## 2020-04-25 DIAGNOSIS — I110 Hypertensive heart disease with heart failure: Secondary | ICD-10-CM | POA: Diagnosis not present

## 2020-04-25 DIAGNOSIS — F17210 Nicotine dependence, cigarettes, uncomplicated: Secondary | ICD-10-CM | POA: Diagnosis not present

## 2020-04-25 DIAGNOSIS — Z7901 Long term (current) use of anticoagulants: Secondary | ICD-10-CM | POA: Insufficient documentation

## 2020-04-25 DIAGNOSIS — Z8249 Family history of ischemic heart disease and other diseases of the circulatory system: Secondary | ICD-10-CM | POA: Diagnosis not present

## 2020-04-25 DIAGNOSIS — I252 Old myocardial infarction: Secondary | ICD-10-CM | POA: Insufficient documentation

## 2020-04-25 DIAGNOSIS — I5042 Chronic combined systolic (congestive) and diastolic (congestive) heart failure: Secondary | ICD-10-CM

## 2020-04-25 DIAGNOSIS — Z9861 Coronary angioplasty status: Secondary | ICD-10-CM | POA: Diagnosis not present

## 2020-04-25 DIAGNOSIS — I2510 Atherosclerotic heart disease of native coronary artery without angina pectoris: Secondary | ICD-10-CM | POA: Insufficient documentation

## 2020-04-25 DIAGNOSIS — J449 Chronic obstructive pulmonary disease, unspecified: Secondary | ICD-10-CM | POA: Insufficient documentation

## 2020-04-25 DIAGNOSIS — E785 Hyperlipidemia, unspecified: Secondary | ICD-10-CM

## 2020-04-25 DIAGNOSIS — Z7982 Long term (current) use of aspirin: Secondary | ICD-10-CM | POA: Insufficient documentation

## 2020-04-25 DIAGNOSIS — I5023 Acute on chronic systolic (congestive) heart failure: Secondary | ICD-10-CM | POA: Insufficient documentation

## 2020-04-25 DIAGNOSIS — F1410 Cocaine abuse, uncomplicated: Secondary | ICD-10-CM | POA: Diagnosis not present

## 2020-04-25 DIAGNOSIS — Z79899 Other long term (current) drug therapy: Secondary | ICD-10-CM | POA: Insufficient documentation

## 2020-04-25 LAB — BASIC METABOLIC PANEL
Anion gap: 13 (ref 5–15)
BUN: 72 mg/dL — ABNORMAL HIGH (ref 6–20)
CO2: 28 mmol/L (ref 22–32)
Calcium: 9.4 mg/dL (ref 8.9–10.3)
Chloride: 93 mmol/L — ABNORMAL LOW (ref 98–111)
Creatinine, Ser: 2.08 mg/dL — ABNORMAL HIGH (ref 0.61–1.24)
GFR calc Af Amer: 41 mL/min — ABNORMAL LOW (ref >60)
GFR calc non Af Amer: 35 mL/min — ABNORMAL LOW (ref >60)
Glucose, Bld: 119 mg/dL — ABNORMAL HIGH (ref 70–99)
Potassium: 5.6 mmol/L — ABNORMAL HIGH (ref 3.5–5.1)
Sodium: 134 mmol/L — ABNORMAL LOW (ref 135–145)

## 2020-04-25 LAB — HEPATIC FUNCTION PANEL
ALT: 39 U/L (ref 0–44)
AST: 31 U/L (ref 15–41)
Albumin: 3.8 g/dL (ref 3.5–5.0)
Alkaline Phosphatase: 94 U/L (ref 38–126)
Bilirubin, Direct: 0.4 mg/dL — ABNORMAL HIGH (ref 0.0–0.2)
Indirect Bilirubin: 0.9 mg/dL (ref 0.3–0.9)
Total Bilirubin: 1.3 mg/dL — ABNORMAL HIGH (ref 0.3–1.2)
Total Protein: 8.2 g/dL — ABNORMAL HIGH (ref 6.5–8.1)

## 2020-04-25 LAB — DIGOXIN LEVEL: Digoxin Level: 0.8 ng/mL (ref 0.8–2.0)

## 2020-04-25 MED ORDER — SACUBITRIL-VALSARTAN 24-26 MG PO TABS: 1.0000 | ORAL_TABLET | Freq: Two times a day (BID) | ORAL | 5 refills | Status: DC

## 2020-04-25 MED ORDER — TORSEMIDE 20 MG PO TABS: 40.0000 mg | ORAL_TABLET | Freq: Two times a day (BID) | ORAL | 4 refills | Status: DC

## 2020-04-25 MED ORDER — SPIRONOLACTONE 25 MG PO TABS: 25.0000 mg | ORAL_TABLET | Freq: Every day | ORAL | 5 refills | Status: DC

## 2020-04-25 MED ORDER — ASPIRIN 81 MG PO TBEC: 81.0000 mg | DELAYED_RELEASE_TABLET | Freq: Every day | ORAL | 5 refills | Status: DC

## 2020-04-25 MED ORDER — DIGOXIN 125 MCG PO TABS: 0.1250 mg | ORAL_TABLET | Freq: Every day | ORAL | 5 refills | Status: DC

## 2020-04-25 MED ORDER — ATORVASTATIN CALCIUM 80 MG PO TABS: 80.0000 mg | ORAL_TABLET | Freq: Every day | ORAL | 3 refills | Status: DC

## 2020-04-25 NOTE — Progress Notes (Signed)
Advanced Heart Failure Clinic Note   Referring Physician: PCP: [name], NP PCP-Cardiologist: Shelva Majestic, MD  [facility]: Dr. Aundra Dubin   Reason for Visit: [facility] F/u for Systolic Heart Failure   HPI: Mr Lecher is a 54 year old with a history of CAD, DES to LAD in 2015, HTN, cocaine, and chronic systolic heart failure.   Admitted 10/02/18 with increased shortness of breathand chest pain. Underwent RHC/LHC with significant CAD but no target. Possible that low EF is due to cocaine abuse. HF medications started.   In the past he was followed in the HF clinic and HF Paramedicine. He was discharged from HF Paramedicine July 2020 due to lack of contact.   Recently admitted to [facility] 4/21 w/ acute on chronic systolic HF w/ low output and volume overload, in the setting of missed medications and cocaine use. Pertinent admission labs: SARS 2 negative, BNP 1195, WBC 11, HS Trop 173>124, HIV NR, lactic acid 4, and UDS + cocaine.  Had low CO-OX and hypotension. Started milrinone 0.25 mcg and IV Lasix. Co-ox and CVPs monitored through PICC line. Co-ox improved w/ milrinone and he diuresed well. Lactic acid normalized. He was weaned of milrinone w/ marginal co-ox, however not a candidate for home inotropes nor advanced therapies due to drug abuse and poor compliance. He was placed on GDMT. He was able to tolerate losartan but BP was too soft, at the time, for [facility]. No  blocker due to low output.  He was transitioned off of IV Lasix and to PO torsemide. Was discharged home on 4/19. D/c wt was 159 lb. He has since been followed by paramedicine.   He presents to clinic today for f/u. Here w/ paramedic, Katie. Breathing much improved. Denies exertional dyspnea. No resting dyspnea. He does however endorses fatigue but also got his second Covid vaccine yesterday, which he thinks may be contributing. He reports full med compliance. Denies dizziness. No syncope/ near syncope. Occasional palpitations  but not sustained. Continues to smoke cigarettes. He denies any further cocaine use.     Review of Systems: [y] = yes, [ ]  = no   General: Weight gain [ ] ; Weight loss [ ] ; Anorexia [ ] ; Fatigue [ ] ; Fever [ ] ; Chills [ ] ; Weakness [ ]   Cardiac: Chest pain/pressure [ ] ; Resting SOB [ ] ; Exertional SOB [ ] ; Orthopnea [ ] ; Pedal Edema [ ] ; Palpitations [ ] ; Syncope [ ] ; Presyncope [ ] ; Paroxysmal nocturnal dyspnea[ ]   Pulmonary: Cough [ ] ; Wheezing[ ] ; Hemoptysis[ ] ; Sputum [ ] ; Snoring [ ]   GI: Vomiting[ ] ; Dysphagia[ ] ; Melena[ ] ; Hematochezia [ ] ; Heartburn[ ] ; Abdominal pain [ ] ; Constipation [ ] ; Diarrhea [ ] ; BRBPR [ ]   GU: Hematuria[ ] ; Dysuria [ ] ; Nocturia[ ]   Vascular: Pain in legs with walking [ ] ; Pain in feet with lying flat [ ] ; Non-healing sores [ ] ; Stroke [ ] ; TIA [ ] ; Slurred speech [ ] ;  Neuro: Headaches[ ] ; Vertigo[ ] ; Seizures[ ] ; Paresthesias[ ] ;Blurred vision [ ] ; Diplopia [ ] ; Vision changes [ ]   Ortho/Skin: Arthritis [ ] ; Joint pain [ ] ; Muscle pain [ ] ; Joint swelling [ ] ; Back Pain [ ] ; Rash [ ]   Psych: Depression[ ] ; Anxiety[ ]   Heme: Bleeding problems [ ] ; Clotting disorders [ ] ; Anemia [ ]   Endocrine: Diabetes [ ] ; Thyroid dysfunction[ ]    Past Medical History:  Diagnosis Date  . CAD in native artery 07/17/14   STEMI- LAD stenosis with DES  .  CHF (congestive heart failure) ([place])   . Cocaine abuse ([place])   . COPD (chronic obstructive pulmonary disease) ([place])   . Dyspnea   . Hypertension   . ST elevation myocardial infarction (STEMI) involving left anterior descending (LAD) coronary artery with complication ([place])   . Tobacco abuse 07/17/2014    Current Outpatient Medications  Medication Sig Dispense Refill  . albuterol (VENTOLIN HFA) 108 (90 Base) MCG/ACT inhaler Inhale 2 puffs into the lungs every 4 (four) hours as needed for wheezing or shortness of breath. 6.7 g 1  . aspirin 81 MG EC tablet Take 1 tablet (81 mg total) by mouth daily. 30 tablet 1  .  atorvastatin (LIPITOR) 80 MG tablet Take 1 tablet (80 mg total) by mouth daily. 90 tablet 3  . digoxin (LANOXIN) 0.125 MG tablet Take 1 tablet (0.125 mg total) by mouth daily. 30 tablet 1  . docusate sodium (COLACE) 100 MG capsule Take 100 mg by mouth daily as needed for mild constipation.    Marland Kitchen losartan (COZAAR) 25 MG tablet Take 1 tablet (25 mg total) by mouth at bedtime. (Patient taking differently: Take 25 mg by mouth at bedtime. ) 30 tablet 1  . Multiple Vitamin (MULTIVITAMIN) tablet Take 1 tablet by mouth daily.    Marland Kitchen NITROSTAT 0.4 MG SL tablet PLACE 1 TABLET UNDER TONGUE AS NEEDED FOR CHEST PAIN EVERY 5 MINUTES X 3 MAX DOSES.  CALL 911 IF PAIN PERSISTS 25 tablet 3  . omeprazole (PRILOSEC) 20 MG capsule Take 1 capsule (20 mg total) by mouth daily. (Patient taking differently: Take 20 mg by mouth daily as needed (reflux/heartburn). ) 30 capsule 3  . spironolactone (ALDACTONE) 25 MG tablet Take 1 tablet (25 mg total) by mouth daily. 30 tablet 1  . torsemide (DEMADEX) 20 MG tablet Take 2 tablets (40 mg total) by mouth 2 (two) times daily. 60 tablet 1   No current facility-administered medications for this encounter.    No Known Allergies    Social History   Socioeconomic History  . Marital status: Single    Spouse name: Not on file  . Number of children: 2  . Years of education: 40  . Highest education level: 12th grade  Occupational History  . Occupation: Landscaping  Tobacco Use  . Smoking status: Current Every Day Smoker    Packs/day: 0.50    Years: 30.00    Pack years: 15.00    Types: Cigarettes  . Smokeless tobacco: Never Used  . Tobacco comment: .5 PK PER DAY  Substance and Sexual Activity  . Alcohol use: No    Comment: Patient denies abuse, states social drinker  . Drug use: Not Currently    Types: Cocaine    Comment: heroin  . Sexual activity: Yes  Other Topics Concern  . Not on file  Social History Narrative   Lives in [place] with his sister and girlfriend.  He works Aeronautical engineer work.    Social Determinants of Health   Financial Resource Strain:   . Difficulty of Paying Living Expenses:   Food Insecurity: Food Insecurity Present  . Worried About Charity fundraiser in the Last Year: Sometimes true  . Ran Out of Food in the Last Year: Never true  Transportation Needs: No Transportation Needs  . Lack of Transportation (Medical): No  . Lack of Transportation (Non-Medical): No  Physical Activity:   . Days of Exercise per Week:   . Minutes of Exercise per Session:   Stress:   .  Feeling of Stress :   Social Connections:   . Frequency of Communication with Friends and Family:   . Frequency of Social Gatherings with Friends and Family:   . Attends Religious Services:   . Active Member of Clubs or Organizations:   . Attends Archivist Meetings:   Marland Kitchen Marital Status:   Intimate Partner Violence:   . Fear of Current or Ex-Partner:   . Emotionally Abused:   Marland Kitchen Physically Abused:   . Sexually Abused:       Family History  Problem Relation Age of Onset  . Cirrhosis Father   . Heart attack Mother   . Heart attack Sister   . Heart failure Sister   . Heart attack Brother     Vitals:   04/25/20 1436  BP: 110/80  Pulse: 100  SpO2: 99%  Weight: 70.4 kg (155 lb 3.2 oz)     PHYSICAL EXAM: General: fatigue appearing, thin AAM. No respiratory difficulty HEENT: normal Neck: supple. no JVD. Carotids 2+ bilat; no bruits. No lymphadenopathy or thyromegaly appreciated. Cor: PMI nondisplaced. Regular rate & rhythm. No rubs, gallops or murmurs. Lungs: clear Abdomen: soft, nontender, nondistended. No hepatosplenomegaly. No bruits or masses. Good bowel sounds. Extremities: no cyanosis, clubbing, rash, edema Neuro: alert & oriented x 3, cranial nerves grossly intact. moves all 4 extremities w/o difficulty. Affect pleasant.  ECG: not performed    ASSESSMENT & PLAN:  1. Acute on chronic systolic CHF: Echo in [id number] with  EF 40-45%, thought to be ischemic cardiomyopathy. Cath in 10/19 showed 90% ostial OM1 and 70% PLV, no interventional target. Suspect mixed ischemic/nonischemic cardiomyopathy. 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 Recent admit for cardiogenic shock =>improved on milrinone but weaned off w/ marginal co-ox. Unfortunately, not a candidate for advanced therapies or home milrinone with active use of cocaine.  - Volume status stable today  - NYHA II-III. Main complaint is mild fatigue today. Thinks may be 2/2 his 2nd covid vaccine he got yesterday.  - He is tolerating Losartan ok. Will stop and transition to Entresto 24-26 bid - We discussed reducing his torsemide from 40 mg bid to once daily but he is hesitant to do this. He will monitor BP and wts at home. Can change to once daily if his wt/ BP drops too much  - Continue digoxin 0.125 daily. Check dig level today  - Continue spironolactone to 25 mg daily.  - Check BMP today and again in 7 days  - no ? blocker w/ recent shock and current fatigue  -Not a candidate for advanced therapies or home milrinone with active use of cocaine.  - he reports that he is still smoking but denies any further cocaine use to me. If he can refrain from further use, may be able to re-evaluate for advanced therapies in the future.  2. CAD: H/o anterior STEMI with DES to LAD in 7/15.As above, cath in 10/19 with 90% ostial OM1 and 70% PLV, no interventional target.  - Denies ischemic chest pain -ContinueASA 81 and atorvastatin 80 mg daily.  3. Cocaine abuse: he denies any further use since hospital d/c. We disused importance of complete abstinence  4. Active smoker: Counseled to quit.  F/u in 2-3 weeks w/ pharmD for further med titration.     Lyda Jester,  PA-C 04/25/20

## 2020-04-25 NOTE — Progress Notes (Signed)
Paramedicine Encounter   Patient ID: Toby Breithaupt , male,   DOB: 01/31/1966,53 y.o.,  MRN: [id number]   Met patient in clinic today with provider.  Caught the end of the visit.  [place] is stopping his losartan, adding low dose entresto. Samples was given to him today to get started until he gets the rx.  Labs done today.  Pt reports feeling more tired. [place] explained that b/c his EF was so low that is to be expected.  [facility] will be working with him for the disability.  He is interested in getting more info on outpt counseling for his drug/alcohol use. He isnt sure about txp  to get him to private sessions.  He had questions about inpatient rehab-he was speaking to [place] about this and she will be handling this for him.  Will f/u with him next week.  Nurse traci sent over his meds to [place].   Weight @ clinic-155 B/p-110/80 p-100 sp02-99    Marylouise Stacks, EMT-Paramedic [phone] 04/25/2020

## 2020-04-25 NOTE — Telephone Encounter (Signed)
Tyler Wong called me to inform me of the many med changers per his labs today.  She was not able to reach pt this afternoon-no answer I also called a bit later and he did not answer.  I also sent him a text to call me but no response. I asked jenna to f/u tomor as well since I am not in the office tomor.  I did call pharmacy to d/c the delivery of those d/c meds and the only ones they will be sending is asa, atorvastatin and torsemide.   Marylouise Stacks, EMT-Paramedic  04/25/20

## 2020-04-25 NOTE — Progress Notes (Signed)
Medication Samples have been provided to the patient.  Drug name: Entresto       Strength: 24/26        Qty: 56  LOT: [id number]  Exp.Date: 8/22  Dosing instructions: 1 tab twice a day  The patient has been instructed regarding the correct time, dose, and frequency of taking this medication, including desired effects and most common side effects.   Tyler Wong 3:21 PM 04/25/2020

## 2020-04-25 NOTE — Progress Notes (Signed)
CSW met with pt during clinic visit to check in.  Pt reports he has not used since he has been home from the hospital but is interested in getting some more information about rehab programs if he decides that he needs further help.  States he currently has some friends who have gotten clean who can take him to NA meetings when he needs but thinks he might benefit from outpatient or inpatient treatment.  Wants to learn more about the options to see if he might want to pursue- CSW provided with list of local substance abuse rehab options and he will plan to call and discuss further with a staff member.  No further needs at this time, CSW will continue to follow and assist as needed  [name], [facility] Desk#: [phone] Cell#: [phone]

## 2020-04-25 NOTE — Patient Instructions (Addendum)
STOP Losartan  START Entresto 24/26mg  (1 tab) twice a day. START TOMORROW  You received samples for [facility] today and repeat in 1 week We will only contact you if something comes back abnormal or we need to make some changes. Otherwise no news is good news!  LAB APPOINTMENT: Thursday May 6th, 2021 at [place]: Tuesday May 18th, 2021 at [facility] code 5008  Please call office at [phone] option 2 if you have any questions or concerns.   At the [facility], you and your health needs are our priority. As part of our continuing mission to provide you with exceptional heart care, we have created designated Provider Care Teams. These Care Teams include your primary Cardiologist (physician) and Advanced Practice Providers (APPs- Physician Assistants and Nurse Practitioners) who all work together to provide you with the care you need, when you need it.   You may see any of the following providers on your designated Care Team at your next follow up: Marland Kitchen Dr Glori Bickers . Dr Loralie Champagne . Darrick Grinder, NP . Lyda Jester, PA . Audry Riles, PharmD   Please be sure to bring in all your medications bottles to every appointment.

## 2020-04-26 ENCOUNTER — Telehealth (HOSPITAL_COMMUNITY): Payer: Self-pay | Admitting: Cardiology

## 2020-04-26 MED ORDER — TORSEMIDE 20 MG PO TABS: 40.0000 mg | ORAL_TABLET | Freq: Every day | ORAL | 4 refills | Status: DC

## 2020-04-26 MED FILL — TORSEMIDE 20 MG TABLET: 20 | 30 days supply | Qty: 60 | Fill #0

## 2020-04-26 NOTE — Telephone Encounter (Signed)
-----   Message from Consuelo Pandy, [place] sent at 04/25/2020  5:45 PM EDT ----- SCr elevated at 2.0 and K 5.6. Recommend that he does not start Entresto. Would not go back to losartan. Stay off ARB/ARNi. Stop digoxin given AKI w/ SCr > 2.0. Hold spironolactone for now. He can take torsemide dose tonight to help w/ hyperkalemia but recommend holding for 2 days due to AKI, then resuming lower dose at 40 mg once daily. Suspect AKI may be due to over diuresis but may also be due to potential low output. Not a candidate for home inotropes. I placed phone call to pt and got VM and left detailed message regarding abnormal labs and med recs. Also updated paramedicine. Will attempt to reach pt again in am for repeat BMP to ensure hyperkalemia has resolved.

## 2020-04-30 ENCOUNTER — Telehealth (HOSPITAL_COMMUNITY): Payer: Self-pay

## 2020-04-30 NOTE — Telephone Encounter (Signed)
Called pt and LVM for him to return my call and also sent him a text but no response.   Marylouise Stacks, EMT-Paramedic 04/30/20

## 2020-05-02 ENCOUNTER — Other Ambulatory Visit (HOSPITAL_COMMUNITY): Payer: Medicaid Other

## 2020-05-02 ENCOUNTER — Telehealth (HOSPITAL_COMMUNITY): Payer: Self-pay

## 2020-05-02 NOTE — Telephone Encounter (Signed)
Called pt again to see if I could make a home visit today and to remind him of lab appointment today. No answer again.   Marylouise Stacks, EMT-Paramedic  05/02/20

## 2020-05-06 ENCOUNTER — Ambulatory Visit (HOSPITAL_COMMUNITY)
Admission: RE | Admit: 2020-05-06 | Discharge: 2020-05-06 | Disposition: A | Discharge status: Home / Self Care | Payer: Medicaid Other | Source: Ambulatory Visit | Attending: Cardiology | Admitting: Cardiology

## 2020-05-06 ENCOUNTER — Other Ambulatory Visit: Payer: Self-pay

## 2020-05-06 ENCOUNTER — Telehealth (HOSPITAL_COMMUNITY): Payer: Self-pay | Admitting: Licensed Clinical Social Worker

## 2020-05-06 DIAGNOSIS — I5042 Chronic combined systolic (congestive) and diastolic (congestive) heart failure: Secondary | ICD-10-CM

## 2020-05-06 NOTE — Telephone Encounter (Signed)
CSW received call from pt informing that he thinks he missed a lab appt.  CSW able to confirm that he missed appt on 5/6.  Per patient he was told to hold his medications until he was able to get labs redrawn so he would like to get them done ASAP.  CSW spoke with clinic RN and had them schedule pt for 11am this morning- pt confirms he can make it to appt.  CSW will continue to follow and assist as needed  [name], [facility] Desk#: [phone] Cell#: [phone]

## 2020-05-07 ENCOUNTER — Other Ambulatory Visit: Payer: Self-pay

## 2020-05-07 ENCOUNTER — Encounter (INDEPENDENT_AMBULATORY_CARE_PROVIDER_SITE_OTHER): Payer: Medicaid Other | Admitting: Primary Care

## 2020-05-08 ENCOUNTER — Telehealth (HOSPITAL_COMMUNITY): Payer: Self-pay | Admitting: *Deleted

## 2020-05-08 ENCOUNTER — Telehealth (HOSPITAL_COMMUNITY): Payer: Self-pay

## 2020-05-08 NOTE — Telephone Encounter (Signed)
Called pt to schedule visit this week, no answer, LVM for him to return my call.   Marylouise Stacks, EMT-Paramedic  05/08/20

## 2020-05-08 NOTE — Telephone Encounter (Signed)
Called pt to r/s lab appointment. No answer/left VM requesting pt return my call .

## 2020-05-08 NOTE — Addendum Note (Signed)
Encounter addended by: Harvie Junior, CMA on: 05/08/2020 1:50 PM  Actions taken: Charge Capture section accepted

## 2020-05-09 ENCOUNTER — Encounter (HOSPITAL_COMMUNITY): Payer: Medicaid Other

## 2020-05-09 ENCOUNTER — Telehealth (HOSPITAL_COMMUNITY): Payer: Self-pay | Admitting: Licensed Clinical Social Worker

## 2020-05-09 NOTE — Telephone Encounter (Signed)
CSW informed yesterday by [employer] that pt labs have not come back after being drawn on Monday.  CSW inquired with triage line yesterday regarding this and per lab he will need to get his labs redrawn.  Community paramedic attempted to contact yesterday regarding getting them rescheduled and CSW attempted to call this morning to get set up- unable to reach- left VM  Will continue to reach out and assist as needed  [name], [facility] Desk#: [phone] Cell#: [phone]

## 2020-05-13 ENCOUNTER — Telehealth (HOSPITAL_COMMUNITY): Payer: Self-pay | Admitting: Licensed Clinical Social Worker

## 2020-05-13 ENCOUNTER — Telehealth (HOSPITAL_COMMUNITY): Payer: Self-pay | Admitting: *Deleted

## 2020-05-13 NOTE — Telephone Encounter (Signed)
CSW informed by [employer] that pt is now responding to communication.  CSW called pt to check in.  Pt reports he is doing ok- had some difficulties last week which made it so he wasn't returning our calls- does not wish to elaborate on difficulties but states they are resolved.  Pt confirms he can come to appt tomorrow and CSW will assist with transport back home.  Pt also reports he got the messages from the [facility] to help with disability application and was able to call and reschedule with them.  CSW will continue to follow and assist as needed  [name], [facility] Desk#: [phone] Cell#: [phone]

## 2020-05-13 NOTE — Telephone Encounter (Signed)
Tyler Wong w/paramedicine called to r/s pts lab appointment. Lab added to office visit tomorrow 5/18

## 2020-05-14 ENCOUNTER — Encounter (HOSPITAL_COMMUNITY): Payer: Medicaid Other

## 2020-05-14 ENCOUNTER — Other Ambulatory Visit (HOSPITAL_COMMUNITY): Payer: Self-pay

## 2020-05-15 ENCOUNTER — Telehealth (HOSPITAL_COMMUNITY): Payer: Self-pay

## 2020-05-15 NOTE — Progress Notes (Signed)
Pt did not show up for his appointment today.  I had spoken to pt yesterday regarding the importance of making it to appointment today and for the labs to be done, he had said he was able to make it but may need cab to get him back home.   Marylouise Stacks, EMT-Paramedic  05/15/20

## 2020-05-15 NOTE — Telephone Encounter (Signed)
Pt had missed clinic appointment yesterday.  Called him to f/u on that but no answer--he sent me to VM.   Marylouise Stacks, EMT-Paramedic  05/15/20

## 2020-05-16 DIAGNOSIS — Z03818 Encounter for observation for suspected exposure to other biological agents ruled out: Secondary | ICD-10-CM | POA: Diagnosis not present

## 2020-05-17 MED FILL — TORSEMIDE 20 MG TABLET: 20 | 30 days supply | Qty: 60 | Fill #0

## 2020-05-19 NOTE — Progress Notes (Signed)
erroneous

## 2020-05-21 ENCOUNTER — Telehealth (HOSPITAL_COMMUNITY): Payer: Self-pay

## 2020-05-21 NOTE — Telephone Encounter (Signed)
Called pt, no answer, LVM for him to return my call.   Marylouise Stacks, EMT-Paramedic  05/21/20

## 2020-05-23 ENCOUNTER — Telehealth (HOSPITAL_COMMUNITY): Payer: Self-pay

## 2020-05-23 NOTE — Telephone Encounter (Signed)
Pt will be d/c from paramedicine program due to lack of contact after numerous attempts.    Marylouise Stacks, EMT-Paramedic  05/23/20

## 2020-05-27 DIAGNOSIS — Z03818 Encounter for observation for suspected exposure to other biological agents ruled out: Secondary | ICD-10-CM | POA: Diagnosis not present

## 2020-06-04 ENCOUNTER — Telehealth (HOSPITAL_COMMUNITY): Payer: Self-pay | Admitting: Licensed Clinical Social Worker

## 2020-06-04 NOTE — Telephone Encounter (Signed)
CSW received email from the [facility] stating that they had successfully assisted pt in applying for disability online.  No further needs at this time  [name], [facility] Desk#: [phone] Cell#: [phone]

## 2020-06-10 DIAGNOSIS — Z03818 Encounter for observation for suspected exposure to other biological agents ruled out: Secondary | ICD-10-CM | POA: Diagnosis not present

## 2020-06-21 ENCOUNTER — Inpatient Hospital Stay (HOSPITAL_COMMUNITY)
Admission: EM | Admit: 2020-06-21 | Discharge: 2020-06-29 | DRG: 291 | Disposition: A | Discharge status: Home / Self Care | Payer: Medicaid Other | Attending: Internal Medicine | Admitting: Internal Medicine

## 2020-06-21 ENCOUNTER — Encounter (HOSPITAL_COMMUNITY): Payer: Self-pay

## 2020-06-21 ENCOUNTER — Other Ambulatory Visit: Payer: Self-pay

## 2020-06-21 ENCOUNTER — Emergency Department (HOSPITAL_COMMUNITY): Payer: Medicaid Other

## 2020-06-21 DIAGNOSIS — J449 Chronic obstructive pulmonary disease, unspecified: Secondary | ICD-10-CM | POA: Diagnosis present

## 2020-06-21 DIAGNOSIS — F1490 Cocaine use, unspecified, uncomplicated: Secondary | ICD-10-CM | POA: Diagnosis present

## 2020-06-21 DIAGNOSIS — Z91199 Patient's noncompliance with other medical treatment and regimen due to unspecified reason: Secondary | ICD-10-CM

## 2020-06-21 DIAGNOSIS — N1832 Chronic kidney disease, stage 3b: Secondary | ICD-10-CM | POA: Diagnosis present

## 2020-06-21 DIAGNOSIS — I428 Other cardiomyopathies: Secondary | ICD-10-CM | POA: Diagnosis present

## 2020-06-21 DIAGNOSIS — Z720 Tobacco use: Secondary | ICD-10-CM | POA: Diagnosis present

## 2020-06-21 DIAGNOSIS — Z9119 Patient's noncompliance with other medical treatment and regimen: Secondary | ICD-10-CM

## 2020-06-21 DIAGNOSIS — I5023 Acute on chronic systolic (congestive) heart failure: Secondary | ICD-10-CM | POA: Diagnosis present

## 2020-06-21 DIAGNOSIS — R0602 Shortness of breath: Secondary | ICD-10-CM | POA: Diagnosis not present

## 2020-06-21 DIAGNOSIS — I081 Rheumatic disorders of both mitral and tricuspid valves: Secondary | ICD-10-CM | POA: Diagnosis present

## 2020-06-21 DIAGNOSIS — Z8249 Family history of ischemic heart disease and other diseases of the circulatory system: Secondary | ICD-10-CM

## 2020-06-21 DIAGNOSIS — I252 Old myocardial infarction: Secondary | ICD-10-CM

## 2020-06-21 DIAGNOSIS — Z20822 Contact with and (suspected) exposure to covid-19: Secondary | ICD-10-CM | POA: Diagnosis present

## 2020-06-21 DIAGNOSIS — I110 Hypertensive heart disease with heart failure: Secondary | ICD-10-CM | POA: Diagnosis not present

## 2020-06-21 DIAGNOSIS — R570 Cardiogenic shock: Secondary | ICD-10-CM | POA: Diagnosis present

## 2020-06-21 DIAGNOSIS — E876 Hypokalemia: Secondary | ICD-10-CM | POA: Diagnosis present

## 2020-06-21 DIAGNOSIS — N170 Acute kidney failure with tubular necrosis: Secondary | ICD-10-CM | POA: Diagnosis present

## 2020-06-21 DIAGNOSIS — E785 Hyperlipidemia, unspecified: Secondary | ICD-10-CM | POA: Diagnosis present

## 2020-06-21 DIAGNOSIS — R079 Chest pain, unspecified: Secondary | ICD-10-CM | POA: Diagnosis not present

## 2020-06-21 DIAGNOSIS — Z955 Presence of coronary angioplasty implant and graft: Secondary | ICD-10-CM

## 2020-06-21 DIAGNOSIS — Z79899 Other long term (current) drug therapy: Secondary | ICD-10-CM

## 2020-06-21 DIAGNOSIS — Z7982 Long term (current) use of aspirin: Secondary | ICD-10-CM

## 2020-06-21 DIAGNOSIS — I10 Essential (primary) hypertension: Secondary | ICD-10-CM | POA: Diagnosis present

## 2020-06-21 DIAGNOSIS — R000 Tachycardia, unspecified: Secondary | ICD-10-CM | POA: Diagnosis present

## 2020-06-21 DIAGNOSIS — N183 Chronic kidney disease, stage 3 unspecified: Secondary | ICD-10-CM | POA: Diagnosis present

## 2020-06-21 DIAGNOSIS — F17210 Nicotine dependence, cigarettes, uncomplicated: Secondary | ICD-10-CM | POA: Diagnosis present

## 2020-06-21 DIAGNOSIS — I1310 Hypertensive heart and chronic kidney disease without heart failure, with stage 1 through stage 4 chronic kidney disease, or unspecified chronic kidney disease: Secondary | ICD-10-CM

## 2020-06-21 DIAGNOSIS — I2510 Atherosclerotic heart disease of native coronary artery without angina pectoris: Secondary | ICD-10-CM | POA: Diagnosis present

## 2020-06-21 DIAGNOSIS — I255 Ischemic cardiomyopathy: Secondary | ICD-10-CM

## 2020-06-21 DIAGNOSIS — F1410 Cocaine abuse, uncomplicated: Secondary | ICD-10-CM | POA: Diagnosis present

## 2020-06-21 DIAGNOSIS — I130 Hypertensive heart and chronic kidney disease with heart failure and stage 1 through stage 4 chronic kidney disease, or unspecified chronic kidney disease: Principal | ICD-10-CM | POA: Diagnosis present

## 2020-06-21 DIAGNOSIS — I517 Cardiomegaly: Secondary | ICD-10-CM | POA: Diagnosis not present

## 2020-06-21 LAB — BASIC METABOLIC PANEL
Anion gap: 15 (ref 5–15)
BUN: 40 mg/dL — ABNORMAL HIGH (ref 6–20)
CO2: 31 mmol/L (ref 22–32)
Calcium: 9.2 mg/dL (ref 8.9–10.3)
Chloride: 89 mmol/L — ABNORMAL LOW (ref 98–111)
Creatinine, Ser: 1.75 mg/dL — ABNORMAL HIGH (ref 0.61–1.24)
GFR calc Af Amer: 50 mL/min — ABNORMAL LOW (ref >60)
GFR calc non Af Amer: 43 mL/min — ABNORMAL LOW (ref >60)
Glucose, Bld: 128 mg/dL — ABNORMAL HIGH (ref 70–99)
Potassium: 3.2 mmol/L — ABNORMAL LOW (ref 3.5–5.1)
Sodium: 135 mmol/L (ref 135–145)

## 2020-06-21 LAB — CBC
HCT: 44.6 % (ref 39.0–52.0)
Hemoglobin: 14.4 g/dL (ref 13.0–17.0)
MCH: 28.7 pg (ref 26.0–34.0)
MCHC: 32.3 g/dL (ref 30.0–36.0)
MCV: 88.8 fL (ref 80.0–100.0)
Platelets: 281 10*3/uL (ref 150–400)
RBC: 5.02 MIL/uL (ref 4.22–5.81)
RDW: 18.4 % — ABNORMAL HIGH (ref 11.5–15.5)
WBC: 9.5 10*3/uL (ref 4.0–10.5)
nRBC: 0 % (ref 0.0–0.2)

## 2020-06-21 LAB — TROPONIN I (HIGH SENSITIVITY)
Troponin I (High Sensitivity): 109 ng/L (ref <18)
Troponin I (High Sensitivity): 110 ng/L (ref <18)

## 2020-06-21 NOTE — ED Triage Notes (Signed)
Pt reports chest pain and sob for the past 3 days, hx of MI with stent placement in 2015. Pt a.o, tachypneic in triage.

## 2020-06-21 NOTE — ED Notes (Signed)
Dr Alvino Chapel aware of pt troponin. No new orders.

## 2020-06-22 ENCOUNTER — Observation Stay: Payer: Self-pay

## 2020-06-22 DIAGNOSIS — I5023 Acute on chronic systolic (congestive) heart failure: Secondary | ICD-10-CM | POA: Insufficient documentation

## 2020-06-22 DIAGNOSIS — R0602 Shortness of breath: Secondary | ICD-10-CM | POA: Diagnosis not present

## 2020-06-22 DIAGNOSIS — F17210 Nicotine dependence, cigarettes, uncomplicated: Secondary | ICD-10-CM | POA: Diagnosis present

## 2020-06-22 DIAGNOSIS — F1410 Cocaine abuse, uncomplicated: Secondary | ICD-10-CM | POA: Diagnosis present

## 2020-06-22 DIAGNOSIS — R570 Cardiogenic shock: Secondary | ICD-10-CM | POA: Diagnosis not present

## 2020-06-22 DIAGNOSIS — E876 Hypokalemia: Secondary | ICD-10-CM | POA: Diagnosis present

## 2020-06-22 DIAGNOSIS — I110 Hypertensive heart disease with heart failure: Secondary | ICD-10-CM | POA: Diagnosis not present

## 2020-06-22 DIAGNOSIS — I428 Other cardiomyopathies: Secondary | ICD-10-CM | POA: Diagnosis present

## 2020-06-22 DIAGNOSIS — I130 Hypertensive heart and chronic kidney disease with heart failure and stage 1 through stage 4 chronic kidney disease, or unspecified chronic kidney disease: Secondary | ICD-10-CM | POA: Diagnosis present

## 2020-06-22 DIAGNOSIS — Z8249 Family history of ischemic heart disease and other diseases of the circulatory system: Secondary | ICD-10-CM | POA: Diagnosis not present

## 2020-06-22 DIAGNOSIS — R000 Tachycardia, unspecified: Secondary | ICD-10-CM | POA: Diagnosis present

## 2020-06-22 DIAGNOSIS — N170 Acute kidney failure with tubular necrosis: Secondary | ICD-10-CM | POA: Diagnosis present

## 2020-06-22 DIAGNOSIS — I252 Old myocardial infarction: Secondary | ICD-10-CM | POA: Diagnosis not present

## 2020-06-22 DIAGNOSIS — I5043 Acute on chronic combined systolic (congestive) and diastolic (congestive) heart failure: Secondary | ICD-10-CM | POA: Insufficient documentation

## 2020-06-22 DIAGNOSIS — Z79899 Other long term (current) drug therapy: Secondary | ICD-10-CM | POA: Diagnosis not present

## 2020-06-22 DIAGNOSIS — I517 Cardiomegaly: Secondary | ICD-10-CM | POA: Diagnosis not present

## 2020-06-22 DIAGNOSIS — I081 Rheumatic disorders of both mitral and tricuspid valves: Secondary | ICD-10-CM | POA: Diagnosis present

## 2020-06-22 DIAGNOSIS — I2510 Atherosclerotic heart disease of native coronary artery without angina pectoris: Secondary | ICD-10-CM | POA: Diagnosis not present

## 2020-06-22 DIAGNOSIS — Z7982 Long term (current) use of aspirin: Secondary | ICD-10-CM | POA: Diagnosis not present

## 2020-06-22 DIAGNOSIS — N1832 Chronic kidney disease, stage 3b: Secondary | ICD-10-CM | POA: Diagnosis present

## 2020-06-22 DIAGNOSIS — E785 Hyperlipidemia, unspecified: Secondary | ICD-10-CM | POA: Diagnosis present

## 2020-06-22 DIAGNOSIS — Z955 Presence of coronary angioplasty implant and graft: Secondary | ICD-10-CM | POA: Diagnosis not present

## 2020-06-22 DIAGNOSIS — J449 Chronic obstructive pulmonary disease, unspecified: Secondary | ICD-10-CM | POA: Diagnosis not present

## 2020-06-22 DIAGNOSIS — R079 Chest pain, unspecified: Secondary | ICD-10-CM | POA: Diagnosis not present

## 2020-06-22 DIAGNOSIS — Z03818 Encounter for observation for suspected exposure to other biological agents ruled out: Secondary | ICD-10-CM | POA: Diagnosis not present

## 2020-06-22 DIAGNOSIS — Z20822 Contact with and (suspected) exposure to covid-19: Secondary | ICD-10-CM | POA: Diagnosis not present

## 2020-06-22 LAB — MRSA PCR SCREENING: MRSA by PCR: NEGATIVE

## 2020-06-22 LAB — COMPREHENSIVE METABOLIC PANEL
ALT: 34 U/L (ref 0–44)
AST: 46 U/L — ABNORMAL HIGH (ref 15–41)
Albumin: 2.9 g/dL — ABNORMAL LOW (ref 3.5–5.0)
Alkaline Phosphatase: 81 U/L (ref 38–126)
Anion gap: 17 — ABNORMAL HIGH (ref 5–15)
BUN: 36 mg/dL — ABNORMAL HIGH (ref 6–20)
CO2: 24 mmol/L (ref 22–32)
Calcium: 8.4 mg/dL — ABNORMAL LOW (ref 8.9–10.3)
Chloride: 91 mmol/L — ABNORMAL LOW (ref 98–111)
Creatinine, Ser: 1.76 mg/dL — ABNORMAL HIGH (ref 0.61–1.24)
GFR calc Af Amer: 50 mL/min — ABNORMAL LOW (ref >60)
GFR calc non Af Amer: 43 mL/min — ABNORMAL LOW (ref >60)
Glucose, Bld: 190 mg/dL — ABNORMAL HIGH (ref 70–99)
Potassium: 4.6 mmol/L (ref 3.5–5.1)
Sodium: 132 mmol/L — ABNORMAL LOW (ref 135–145)
Total Bilirubin: 1.8 mg/dL — ABNORMAL HIGH (ref 0.3–1.2)
Total Protein: 6.1 g/dL — ABNORMAL LOW (ref 6.5–8.1)

## 2020-06-22 LAB — BASIC METABOLIC PANEL
Anion gap: 11 (ref 5–15)
BUN: 35 mg/dL — ABNORMAL HIGH (ref 6–20)
CO2: 32 mmol/L (ref 22–32)
Calcium: 9 mg/dL (ref 8.9–10.3)
Chloride: 91 mmol/L — ABNORMAL LOW (ref 98–111)
Creatinine, Ser: 1.64 mg/dL — ABNORMAL HIGH (ref 0.61–1.24)
GFR calc Af Amer: 54 mL/min — ABNORMAL LOW (ref >60)
GFR calc non Af Amer: 47 mL/min — ABNORMAL LOW (ref >60)
Glucose, Bld: 159 mg/dL — ABNORMAL HIGH (ref 70–99)
Potassium: 3.4 mmol/L — ABNORMAL LOW (ref 3.5–5.1)
Sodium: 134 mmol/L — ABNORMAL LOW (ref 135–145)

## 2020-06-22 LAB — RAPID URINE DRUG SCREEN, HOSP PERFORMED
Amphetamines: NOT DETECTED
Barbiturates: NOT DETECTED
Benzodiazepines: NOT DETECTED
Cocaine: POSITIVE — AB
Opiates: NOT DETECTED
Tetrahydrocannabinol: NOT DETECTED

## 2020-06-22 LAB — COOXEMETRY PANEL
Carboxyhemoglobin: 0.8 % (ref 0.5–1.5)
Methemoglobin: 1 % (ref 0.0–1.5)
O2 Saturation: 37.8 %
Total hemoglobin: 14.5 g/dL (ref 12.0–16.0)

## 2020-06-22 LAB — CBC
HCT: 41 % (ref 39.0–52.0)
Hemoglobin: 13.4 g/dL (ref 13.0–17.0)
MCH: 28.9 pg (ref 26.0–34.0)
MCHC: 32.7 g/dL (ref 30.0–36.0)
MCV: 88.4 fL (ref 80.0–100.0)
Platelets: 232 10*3/uL (ref 150–400)
RBC: 4.64 MIL/uL (ref 4.22–5.81)
RDW: 18.2 % — ABNORMAL HIGH (ref 11.5–15.5)
WBC: 8.1 10*3/uL (ref 4.0–10.5)
nRBC: 0 % (ref 0.0–0.2)

## 2020-06-22 LAB — BRAIN NATRIURETIC PEPTIDE: B Natriuretic Peptide: 913 pg/mL — ABNORMAL HIGH (ref 0.0–100.0)

## 2020-06-22 LAB — SARS CORONAVIRUS 2 BY RT PCR (HOSPITAL ORDER, PERFORMED IN ~~LOC~~ HOSPITAL LAB): SARS Coronavirus 2: NEGATIVE

## 2020-06-22 LAB — LACTIC ACID, PLASMA: Lactic Acid, Venous: 2.6 mmol/L (ref 0.5–1.9)

## 2020-06-22 LAB — DIGOXIN LEVEL: Digoxin Level: <0.2 ng/mL — ABNORMAL LOW (ref 0.8–2.0)

## 2020-06-22 LAB — MAGNESIUM: Magnesium: 1.8 mg/dL (ref 1.7–2.4)

## 2020-06-22 MED ORDER — DOCUSATE SODIUM 100 MG PO CAPS
100.0000 mg | ORAL_CAPSULE | Freq: Every day | ORAL | Status: DC | PRN
  Administered 2020-06-22 – 2020-06-24 (×3): 100 mg via ORAL
  Filled 2020-06-22 (×3): qty 1

## 2020-06-22 MED ORDER — ADULT MULTIVITAMIN W/MINERALS CH
1.0000 | ORAL_TABLET | Freq: Every day | ORAL | Status: DC
  Administered 2020-06-22 – 2020-06-29 (×8): 1 via ORAL
  Filled 2020-06-22 (×8): qty 1

## 2020-06-22 MED ORDER — SODIUM CHLORIDE 0.9% FLUSH
3.0000 mL | Freq: Two times a day (BID) | INTRAVENOUS | Status: DC
  Administered 2020-06-22 – 2020-06-28 (×3): 3 mL via INTRAVENOUS

## 2020-06-22 MED ORDER — LORAZEPAM 2 MG/ML IJ SOLN
1.0000 mg | Freq: Once | INTRAMUSCULAR | Status: DC
  Filled 2020-06-22: qty 1

## 2020-06-22 MED ORDER — ATORVASTATIN CALCIUM 80 MG PO TABS
80.0000 mg | ORAL_TABLET | Freq: Every day | ORAL | Status: DC
  Administered 2020-06-22 – 2020-06-27 (×6): 80 mg via ORAL
  Filled 2020-06-22 (×6): qty 1

## 2020-06-22 MED ORDER — PANTOPRAZOLE SODIUM 40 MG PO TBEC
40.0000 mg | DELAYED_RELEASE_TABLET | Freq: Every day | ORAL | Status: DC
  Administered 2020-06-22 – 2020-06-29 (×8): 40 mg via ORAL
  Filled 2020-06-22 (×8): qty 1

## 2020-06-22 MED ORDER — HEPARIN SODIUM (PORCINE) 5000 UNIT/ML IJ SOLN
5000.0000 [IU] | Freq: Three times a day (TID) | INTRAMUSCULAR | Status: DC
  Administered 2020-06-22 – 2020-06-29 (×22): 5000 [IU] via SUBCUTANEOUS
  Filled 2020-06-22 (×22): qty 1

## 2020-06-22 MED ORDER — ASPIRIN EC 81 MG PO TBEC
81.0000 mg | DELAYED_RELEASE_TABLET | Freq: Every day | ORAL | Status: DC
  Administered 2020-06-22 – 2020-06-29 (×8): 81 mg via ORAL
  Filled 2020-06-22 (×8): qty 1

## 2020-06-22 MED ORDER — FUROSEMIDE 10 MG/ML IJ SOLN
40.0000 mg | Freq: Every day | INTRAMUSCULAR | Status: DC
  Administered 2020-06-22 – 2020-06-23 (×2): 40 mg via INTRAVENOUS
  Filled 2020-06-22 (×2): qty 4

## 2020-06-22 MED ORDER — SODIUM CHLORIDE 0.9% FLUSH
10.0000 mL | Freq: Two times a day (BID) | INTRAVENOUS | Status: DC
  Administered 2020-06-22 – 2020-06-28 (×10): 10 mL

## 2020-06-22 MED ORDER — SODIUM CHLORIDE 0.9 % IV SOLN: 250.0000 mL | INTRAVENOUS | Status: DC | PRN

## 2020-06-22 MED ORDER — ALBUTEROL SULFATE (2.5 MG/3ML) 0.083% IN NEBU: 2.5000 mg | INHALATION_SOLUTION | RESPIRATORY_TRACT | Status: DC | PRN

## 2020-06-22 MED ORDER — SODIUM CHLORIDE 0.9 % IV BOLUS
125.0000 mL | Freq: Once | INTRAVENOUS | Status: AC
  Administered 2020-06-23: 125 mL via INTRAVENOUS

## 2020-06-22 MED ORDER — SODIUM CHLORIDE 0.9% FLUSH: 3.0000 mL | INTRAVENOUS | Status: DC | PRN

## 2020-06-22 MED ORDER — CHLORHEXIDINE GLUCONATE CLOTH 2 % EX PADS
6.0000 | MEDICATED_PAD | Freq: Every day | CUTANEOUS | Status: DC
  Administered 2020-06-22 – 2020-06-29 (×6): 6 via TOPICAL

## 2020-06-22 MED ORDER — ALBUTEROL SULFATE HFA 108 (90 BASE) MCG/ACT IN AERS
4.0000 | INHALATION_SPRAY | Freq: Once | RESPIRATORY_TRACT | Status: AC
  Administered 2020-06-22: 4 via RESPIRATORY_TRACT
  Filled 2020-06-22: qty 6.7

## 2020-06-22 MED ORDER — ACETAMINOPHEN 325 MG PO TABS
650.0000 mg | ORAL_TABLET | ORAL | Status: DC | PRN
  Administered 2020-06-28 (×2): 650 mg via ORAL
  Filled 2020-06-22 (×2): qty 2

## 2020-06-22 MED ORDER — DIGOXIN 125 MCG PO TABS
0.1250 mg | ORAL_TABLET | Freq: Every day | ORAL | Status: DC
  Administered 2020-06-22 – 2020-06-29 (×8): 0.125 mg via ORAL
  Filled 2020-06-22 (×8): qty 1

## 2020-06-22 MED ORDER — SODIUM CHLORIDE 0.9% FLUSH: 10.0000 mL | INTRAVENOUS | Status: DC | PRN

## 2020-06-22 MED ORDER — FUROSEMIDE 10 MG/ML IJ SOLN: 80.0000 mg | Freq: Once | INTRAMUSCULAR | Status: DC

## 2020-06-22 MED ORDER — MILRINONE LACTATE IN DEXTROSE 20-5 MG/100ML-% IV SOLN
0.1250 ug/kg/min | INTRAVENOUS | Status: DC
  Administered 2020-06-22 – 2020-06-23 (×3): 0.25 ug/kg/min via INTRAVENOUS
  Administered 2020-06-25 (×2): 0.375 ug/kg/min via INTRAVENOUS
  Administered 2020-06-26: 0.25 ug/kg/min via INTRAVENOUS
  Administered 2020-06-26: 0.375 ug/kg/min via INTRAVENOUS
  Administered 2020-06-27: 0.125 ug/kg/min via INTRAVENOUS
  Filled 2020-06-22 (×8): qty 100

## 2020-06-22 MED ORDER — POTASSIUM CHLORIDE CRYS ER 20 MEQ PO TBCR
20.0000 meq | EXTENDED_RELEASE_TABLET | Freq: Once | ORAL | Status: AC
  Administered 2020-06-22: 20 meq via ORAL
  Filled 2020-06-22: qty 1

## 2020-06-22 NOTE — Progress Notes (Signed)
Pt had a 7 beats of V-tach non-sustained, pt is asymptomatic, paged to cardiology on call. Otherwise pt is not complaining any chest pain and SOB, oxygen saturation is maintained between 95-100. Milrinone continue at 5.48 ml/hr. Will continue to monitor the patient  Palma Holter, RN

## 2020-06-22 NOTE — Progress Notes (Signed)
CRITICAL VALUE ALERT  Critical Value:  Lactid acid - 2.6  Date & Time Notied:  06/22/2020, 7.52am  Provider Notified: Dr Loralie Champagne  Orders Received/Actions taken: waiting for the worders  Palma Holter, RN

## 2020-06-22 NOTE — Progress Notes (Signed)
   Progress Note  Patient Name: Tyler Wong Date of Encounter: 06/22/2020  Primary Cardiologist: Dr. Shelva Majestic / Dr. Loralie Champagne  PICC line placed and subsequent co-ox 37.8.  Recent systolics ranging 19T to 660A.  Starting milrinone at 0.25 mcg/kg/min.  Continue Lanoxin and IV Lasix for now.  Signed, Rozann Lesches, MD  06/22/2020, 2:38 PM

## 2020-06-22 NOTE — Progress Notes (Signed)
Peripherally Inserted Central Catheter Placement  The IV Nurse has discussed with the patient and/or persons authorized to consent for the patient, the purpose of this procedure and the potential benefits and risks involved with this procedure.  The benefits include less needle sticks, lab draws from the catheter, and the patient may be discharged home with the catheter. Risks include, but not limited to, infection, bleeding, blood clot (thrombus formation), and puncture of an artery; nerve damage and irregular heartbeat and possibility to perform a PICC exchange if needed/ordered by physician.  Alternatives to this procedure were also discussed.  Bard Power PICC patient education guide, fact sheet on infection prevention and patient information card has been provided to patient /or left at bedside.    PICC Placement Documentation  PICC Double Lumen 06/22/20 PICC Right Brachial 40 cm 0 cm (Active)  Indication for Insertion or Continuance of Line Chronic illness with exacerbations (CF, Sickle Cell, etc.) 06/22/20 1332  Exposed Catheter (cm) 0 cm 06/22/20 1332  Site Assessment Clean;Dry;Intact 06/22/20 1332  Lumen #1 Status Flushed;Saline locked;Blood return noted 06/22/20 1332  Lumen #2 Status Flushed;Saline locked;Blood return noted 06/22/20 1332  Dressing Type Transparent 06/22/20 1332  Dressing Status Clean;Dry;Intact;Antimicrobial disc in place 06/22/20 1332  Safety Lock Not Applicable 36/62/94 7654  Line Care Connections checked and tightened 06/22/20 1332  Line Adjustment (NICU/IV Team Only) No 06/22/20 1332  Dressing Intervention New dressing 06/22/20 1332  Dressing Change Due 06/29/20 06/22/20 1332       Rolena Infante 06/22/2020, 1:33 PM

## 2020-06-22 NOTE — Progress Notes (Signed)
Spoke with Palma Holter, RN c/o PICC order. Patient able to sign consent.

## 2020-06-22 NOTE — H&P (Signed)
Cardiology Admission History and Physical:   Patient ID: Tyler Wong MRN: [id number]; DOB: April 07, 1966   Admission date: 06/21/2020  Primary Care Provider: [name], NP [facility] Cardiologist:  Tyler Wong Electrophysiologist:  None   Chief Complaint:  Shortness of breath.   Patient Profile:   Tyler Wong is a 54 year old with a history of CAD, DES to LAD in 2015, HTN, cocaine, and chronic systolic heart failure.   History of Present Illness:   Admitted 10/02/18 with increased shortness of breathand chest pain. Underwent RHC/LHC with significant CAD but no target. Possible that low EF is due to cocaine abuse. HF medications started.    Admitted to [facility] 4/21 w/ acute on chronic systolic HF w/ low output and volume overload, in the setting of missed medications and cocaine use. Pertinent admission labs: SARS 2 negative,BNP 1195,WBC 11, HS Trop 173>124, HIV NR, lactic acid 4, and UDS + cocaine. Had low CO-OXand hypotension. Startedmilrinone 0.25 mcg and IV Lasix. Co-ox and CVPs monitored through PICC line. Co-ox improved w/ milrinone and he diuresed well. Lactic acid normalized. He was weaned of milrinone w/ marginal co-ox, however not a candidate for home inotropes nor advanced therapies due to drug abuse and poor compliance. He was placed on GDMT. He was able to tolerate losartan but BP was too soft, at the time, for [facility] No ? blocker due to low output.  He was transitioned off of IV Lasix and to PO torsemide. Was discharged home on 4/19. D/c wt was 159 lb.   He followed up with HF after discharge and was noted, by labs, to have worsening AKI with Cr 2 and K 5.6. He was instructed to stop digoxin, spironolacone, and losartan. He was scheduled to return for follow up labs and appointment but he did not show.Over the last few weeks he has noted worsening SOB and chest pressure as well as some abdominal fullness and bloating. He self-increased his torsemide to 60mg  twice  a day (from 40mg  twice a day). He has not been checking his home blood pressures with this. He last used cocaine "a few weeks ago"  Upon arrival to the ER, EKG with sinus tachycardia. BP ranged from 91/78 to 120/95. EKG non-ischemic and unchanged. hsTn 100 -> 101. BNP to 900s. CXR with stable cardiomegaly. Cardiology consulted given patients desire to re-initiate his HF medications.    Past Medical History:  Diagnosis Date  . CAD in native artery 07/17/14   STEMI- LAD stenosis with DES  . CHF (congestive heart failure) ([place])   . Cocaine abuse ([place])   . COPD (chronic obstructive pulmonary disease) ([place])   . Dyspnea   . Hypertension   . ST elevation myocardial infarction (STEMI) involving left anterior descending (LAD) coronary artery with complication ([place])   . Tobacco abuse 07/17/2014    Past Surgical History:  Procedure Laterality Date  . CORONARY ANGIOPLASTY WITH STENT PLACEMENT  07/18/14   resolute DES to LAD STEMI  . LEFT HEART CATH N/A 07/17/2014   Procedure: LEFT HEART CATH;  Surgeon: Tyler Sine, MD;  Location: [facility] CATH LAB;  Service: Cardiovascular;  Laterality: N/A;  . PERCUTANEOUS CORONARY STENT INTERVENTION (PCI-S)  07/17/2014   Procedure: PERCUTANEOUS CORONARY STENT INTERVENTION (PCI-S);  Surgeon: Tyler Sine, MD;  Location: [facility] CATH LAB;  Service: Cardiovascular;;  . RIGHT/LEFT HEART CATH AND CORONARY ANGIOGRAPHY N/A 10/03/2018   Procedure: RIGHT/LEFT HEART CATH AND CORONARY ANGIOGRAPHY;  Surgeon: Tyler Dresser, MD;  Location: [place]  CV LAB;  Service: Cardiovascular;  Laterality: N/A;     Medications Prior to Admission: Prior to Admission medications   Medication Sig Start Date End Date Taking? Authorizing Provider  albuterol (VENTOLIN HFA) 108 (90 Base) MCG/ACT inhaler Inhale 2 puffs into the lungs every 4 (four) hours as needed for wheezing or shortness of breath. 01/24/20   Tyler Perna, NP  aspirin 81 MG EC tablet Take 1 tablet (81 mg total) by mouth  daily. 04/25/20   Tyler Jester M, PA-C  atorvastatin (LIPITOR) 80 MG tablet Take 1 tablet (80 mg total) by mouth daily. 04/25/20   Tyler Jester M, PA-C  docusate sodium (COLACE) 100 MG capsule Take 100 mg by mouth daily as needed for mild constipation.    [provider]  Multiple Vitamin (MULTIVITAMIN) tablet Take 1 tablet by mouth daily.    [provider]  NITROSTAT 0.4 MG SL tablet PLACE 1 TABLET UNDER TONGUE AS NEEDED FOR CHEST PAIN EVERY 5 MINUTES X 3 MAX DOSES.  CALL 911 IF PAIN PERSISTS 02/10/18   Tyler Sine, MD  omeprazole (PRILOSEC) 20 MG capsule Take 1 capsule (20 mg total) by mouth daily. Patient taking differently: Take 20 mg by mouth daily as needed (reflux/heartburn).  01/24/20   Tyler Perna, NP  torsemide (DEMADEX) 20 MG tablet Take 2 tablets (40 mg total) by mouth daily. 04/29/20   Tyler Pandy, PA-C     Allergies:   No Known Allergies  Social History:   Social History   Socioeconomic History  . Marital status: Single    Spouse name: Not on file  . Number of children: 2  . Years of education: 36  . Highest education level: 12th grade  Occupational History  . Occupation: Landscaping  Tobacco Use  . Smoking status: Current Every Day Smoker    Packs/day: 0.50    Years: 30.00    Pack years: 15.00    Types: Cigarettes  . Smokeless tobacco: Never Used  . Tobacco comment: .5 PK PER DAY  Vaping Use  . Vaping Use: Never used  Substance and Sexual Activity  . Alcohol use: No    Comment: Patient denies abuse, states social drinker  . Drug use: Not Currently    Types: Cocaine    Comment: heroin  . Sexual activity: Yes  Other Topics Concern  . Not on file  Social History Narrative   Lives in [place] with his sister and girlfriend. He works Aeronautical engineer work.    Social Determinants of Health   Financial Resource Strain:   . Difficulty of Paying Living Expenses:   Food Insecurity: Food Insecurity Present    . Worried About Charity fundraiser in the Last Year: Sometimes true  . Ran Out of Food in the Last Year: Never true  Transportation Needs: No Transportation Needs  . Lack of Transportation (Medical): No  . Lack of Transportation (Non-Medical): No  Physical Activity:   . Days of Exercise per Week:   . Minutes of Exercise per Session:   Stress:   . Feeling of Stress :   Social Connections:   . Frequency of Communication with Friends and Family:   . Frequency of Social Gatherings with Friends and Family:   . Attends Religious Services:   . Active Member of Clubs or Organizations:   . Attends Archivist Meetings:   Marland Kitchen Marital Status:   Intimate Partner Violence:   . Fear of Current or Ex-Partner:   .  Emotionally Abused:   Marland Kitchen Physically Abused:   . Sexually Abused:     Family History:   The patient's family history includes Cirrhosis in his father; Heart attack in his brother, mother, and sister; Heart failure in his sister.    ROS:  Please see the history of present illness.  All other ROS reviewed and negative.     Physical Exam/Data:   Vitals:   06/22/20 0215 06/22/20 0316 06/22/20 0407 06/22/20 0433  BP: 106/64 102/69 91/78 (!) 116/96  Pulse: (!) 58  (!) 107   Resp: 13 19 (!) 26 19  Temp:      TempSrc:      SpO2: 100%  100%   Weight:      Height:       No intake or output data in the 24 hours ending 06/22/20 0520 Last 3 Weights 06/21/2020 04/25/2020 04/17/2020  Weight (lbs) 176 lb 155 lb 3.2 oz 158 lb  Weight (kg) 79.833 kg 70.398 kg 71.668 kg     Body mass index is 24.9 kg/Wong.  General:  Thin african [place] man, pleasant, in no distress.  HEENT: normal Neck: JVD to 10 mmH20 Vascular: No carotid bruits; FA pulses 2+ bilaterally without bruits  Cardiac:  normal S1, S2; RRR; no murmurs Lungs:  clear to auscultation bilaterally, no wheezing, rhonchi or rales  Abd: firm, mildly tender.  Ext: no edema. Warm and well perfused.  Musculoskeletal:  No  deformities, BUE and BLE strength normal and equal Skin: warm and dry  Psych:  Normal affect   EKG:  Sinus tachycardia LVH, LAFB.   Relevant CV Studies: TTE 03/2020: 1. Left ventricular ejection fraction, by estimation, is <20%. The left  ventricle has severely decreased function. The left ventricle demonstrates  global hypokinesis. The left ventricular internal cavity size was  moderately dilated. There is mild left  ventricular hypertrophy. Left ventricular diastolic parameters are  consistent with Grade II diastolic dysfunction (pseudonormalization).  2. Right ventricular systolic function is mildly reduced. The right  ventricular size is moderately enlarged. There is moderately elevated  pulmonary artery systolic pressure.  3. Left atrial size was moderately dilated.  4. Right atrial size was moderately dilated.  5. The mitral valve is normal in structure. Moderate mitral valve  regurgitation. No evidence of mitral stenosis.  6. Tricuspid valve regurgitation is severe.  7. The aortic valve is normal in structure. Aortic valve regurgitation is  not visualized. No aortic stenosis is present.   Laboratory Data:  High Sensitivity Troponin:   Recent Labs  Lab 06/21/20 1508 06/21/20 1933  TROPONINIHS 109* 110*      Chemistry Recent Labs  Lab 06/21/20 1508  NA 135  K 3.2*  CL 89*  CO2 31  GLUCOSE 128*  BUN 40*  CREATININE 1.75*  CALCIUM 9.2  GFRNONAA 43*  GFRAA 50*  ANIONGAP 15    No results for input(s): PROT, ALBUMIN, AST, ALT, ALKPHOS, BILITOT in the last 168 hours. Hematology Recent Labs  Lab 06/21/20 1508  WBC 9.5  RBC 5.02  HGB 14.4  HCT 44.6  MCV 88.8  MCH 28.7  MCHC 32.3  RDW 18.4*  PLT 281   BNP Recent Labs  Lab 06/22/20 0105  BNP 913.0*    DDimer No results for input(s): DDIMER in the last 168 hours.   Radiology/Studies:  DG Chest 2 View  Result Date: 06/21/2020 CLINICAL DATA:  Left-sided chest pain with shortness of breath for  a few days. History of COPD. EXAM:  CHEST - 2 VIEW COMPARISON:  Radiographs 04/12/2020 and 04/09/2020.  CT 04/09/2020. FINDINGS: PICC has been removed. There is stable moderate cardiomegaly with vascular congestion and chronic interstitial prominence. This does not appear significantly changed over the last several chest radiographs. Stable linear right upper lobe scarring. There is no confluent airspace opacity, pleural effusion or pneumothorax. The bones appear unchanged. IMPRESSION: Stable cardiomegaly, vascular congestion and chronic interstitial prominence. No acute findings. Electronically Signed   By: Richardean Sale Wong.D.   On: 06/21/2020 15:45    New York Heart Association (NYHA) Functional Class: IIIB-IV  Assessment and Plan:   ASSESSMENT & PLAN:  1. 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 Cath in 10/19 showed 90% ostial OM1 and 70% PLV, no interventional target. Suspect mixed ischemic/nonischemic cardiomyopathy. 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 Recent admit for cardiogenic shock =>improved on milrinone but weaned off w/ marginal co-ox. Unfortunately, not a candidate for advanced therapies or home milrinone with active use of cocaine. - Volume status only mildly hypervolemic today.  - NYHA IIIb - He has been off all medications with significant difficulty in following up. I discussed this with him today and we agreed an observation admission for medication titration with close monitoring of BP and creatinine likely the safest as he recognizes follow up has been a challenge.  - Will replete K and repeat CMP. If K > 3.5 will plan to restart digoxin 0.125 daily as well as spironolactone 25mg  daily to ensure his renal  function, K tolerates this.  - He last took 40mg  torsemide with reasonable UOP yesterday PM; pending AM labs will redose diuretic.  - no ?blocker w/ recent shock and current fatigue. -Not a candidate for advanced therapies or home milrinone with active use of cocaine.   2. CAD: H/o anterior STEMI with DES to LAD in 7/15.As above, cath in 10/19 with 90% ostial OM1 and 70% PLV, no interventional target.  - Denies ischemic chest pain -ContinueASA 81 and atorvastatin 80 mg daily.   Severity of Illness: The appropriate patient status for this patient is OBSERVATION. Observation status is judged to be reasonable and necessary in order to provide the required intensity of service to ensure the patient's safety. The patient's presenting symptoms, physical exam findings, and initial radiographic and laboratory data in the context of their medical condition is felt to place them at decreased risk for further clinical deterioration. Furthermore, it is anticipated that the patient will be medically stable for discharge from the hospital within 2 midnights of admission. The following factors support the patient status of observation.   " The patient's presenting symptoms include shortness of breath.. " The physical exam findings include tachycardia.  " The initial radiographic and laboratory data are suggestive of ADHF.    For questions or updates, please contact [place] Please consult [email or web address] for contact info under     Signed, Milus Banister, MD  06/22/2020 5:20 AM

## 2020-06-22 NOTE — ED Notes (Signed)
Pt ambulated in hallway with pulse oximetry. Pt maintained 100% on room air throughout ambulation.

## 2020-06-22 NOTE — ED Provider Notes (Signed)
[facility] EMERGENCY DEPARTMENT Provider Note   CSN: [id number] Arrival date & time: 06/21/20  1428     History Chief Complaint  Patient presents with  . Chest Pain  . Shortness of Breath    Tyler Wong is a 54 y.o. male.  HPI     54 year old male comes in a chief complaint of chest pain and shortness of breath. Patient has history of CAD, CHF, COPD, ongoing cocaine use. Patient reports that his been feeling shortness of breath for the last few days.  His shortness of breath is worse with laying flat and with exertion.  Normally patient does not have any difficulty ambulating, now with minimal exertion gets up short of breath.  He has no new chest pain.  He denies any new cough, fevers, chills.  Patient does admit to using cocaine again, last use was couple of weeks ago.  He continues to smoke cigarettes.  He reports that his been taking his Lasix as prescribed.  All the rest of his medications were discontinued by his cardiologist.  Past Medical History:  Diagnosis Date  . CAD in native artery 07/17/14   STEMI- LAD stenosis with DES  . CHF (congestive heart failure) ([place])   . Cocaine abuse ([place])   . COPD (chronic obstructive pulmonary disease) ([place])   . Dyspnea   . Hypertension   . ST elevation myocardial infarction (STEMI) involving left anterior descending (LAD) coronary artery with complication ([place])   . Tobacco abuse 07/17/2014    Patient Active Problem List   Diagnosis Date Noted  . Acute on chronic clinical systolic heart failure ([place]) 06/22/2020  . Acute on chronic systolic (congestive) heart failure ([place]) 06/22/2020  . Palliative care encounter   . Cardiogenic shock ([place]) 04/11/2020  . CKD (chronic kidney disease) stage 3, GFR 30-59 ml/min 04/11/2020  . Symptomatic anemia 04/09/2020  . Acute on chronic HFrEF (heart failure with reduced ejection fraction) ([place]) 05/05/2019  . HFrEF (heart failure with reduced ejection fraction) ([place]) 05/04/2019    . Acute combined systolic and diastolic congestive heart failure ([place]) 10/02/2018  . Hepatitis C 04/13/2017  . Current non-adherence to medical treatment 04/13/2017  . Acute on chronic combined systolic (congestive) and diastolic (congestive) heart failure ([place]) 04/13/2017  . HTN (hypertension) 04/12/2017  . Insomnia 01/18/2017  . Chronic combined systolic and diastolic congestive heart failure ([place]) 01/04/2017  . Hyperlipidemia 01/04/2017  . Chest pain 12/30/2016  . CAD S/P percutaneous coronary angioplasty 07/18/2014  . Tobacco abuse 07/17/2014  . Cocaine use 07/17/2014    Past Surgical History:  Procedure Laterality Date  . CORONARY ANGIOPLASTY WITH STENT PLACEMENT  07/18/14   resolute DES to LAD STEMI  . LEFT HEART CATH N/A 07/17/2014   Procedure: LEFT HEART CATH;  Surgeon: [name], MD;  Location: [facility] CATH LAB;  Service: Cardiovascular;  Laterality: N/A;  . PERCUTANEOUS CORONARY STENT INTERVENTION (PCI-S)  07/17/2014   Procedure: PERCUTANEOUS CORONARY STENT INTERVENTION (PCI-S);  Surgeon: [name], MD;  Location: [facility] CATH LAB;  Service: Cardiovascular;;  . RIGHT/LEFT HEART CATH AND CORONARY ANGIOGRAPHY N/A 10/03/2018   Procedure: RIGHT/LEFT HEART CATH AND CORONARY ANGIOGRAPHY;  Surgeon: [name], MD;  Location: [facility] CV LAB;  Service: Cardiovascular;  Laterality: N/A;       Family History  Problem Relation Age of Onset  . Cirrhosis Father   . Heart attack Mother   . Heart attack Sister   . Heart failure Sister   . Heart  attack Brother     Social History   Tobacco Use  . Smoking status: Current Every Day Smoker    Packs/day: 0.50    Years: 30.00    Pack years: 15.00    Types: Cigarettes  . Smokeless tobacco: Never Used  . Tobacco comment: .5 PK PER DAY  Vaping Use  . Vaping Use: Never used  Substance Use Topics  . Alcohol use: No    Comment: Patient denies abuse, states social drinker  . Drug use: Not Currently    Types: Cocaine     Comment: heroin    Home Medications Prior to Admission medications   Medication Sig Start Date End Date Taking? Authorizing Provider  albuterol (VENTOLIN HFA) 108 (90 Base) MCG/ACT inhaler Inhale 2 puffs into the lungs every 4 (four) hours as needed for wheezing or shortness of breath. 01/24/20  Yes [name], NP  aspirin 81 MG EC tablet Take 1 tablet (81 mg total) by mouth daily. 04/25/20  Yes Rosita Fire, Brittainy M, PA-C  atorvastatin (LIPITOR) 80 MG tablet Take 1 tablet (80 mg total) by mouth daily. 04/25/20  Yes Lyda Jester M, PA-C  docusate sodium (COLACE) 100 MG capsule Take 100 mg by mouth daily as needed for mild constipation.   Yes [provider]  Multiple Vitamin (MULTIVITAMIN) tablet Take 1 tablet by mouth daily.   Yes [provider]  NITROSTAT 0.4 MG SL tablet PLACE 1 TABLET UNDER TONGUE AS NEEDED FOR CHEST PAIN EVERY 5 MINUTES X 3 MAX DOSES.  CALL 911 IF PAIN PERSISTS Patient taking differently: Place 0.4 mg under the tongue every 5 (five) minutes as needed for chest pain.  02/10/18  Yes [name], MD  omeprazole (PRILOSEC) 20 MG capsule Take 1 capsule (20 mg total) by mouth daily. Patient taking differently: Take 20 mg by mouth daily as needed (reflux/heartburn).  01/24/20  Yes [name], NP  torsemide (DEMADEX) 20 MG tablet Take 2 tablets (40 mg total) by mouth daily. 04/29/20  Yes Consuelo Pandy, PA-C    Allergies    Patient has no known allergies.  Review of Systems   Review of Systems  Constitutional: Positive for activity change.  Respiratory: Positive for shortness of breath. Negative for cough.   Cardiovascular: Negative for chest pain.  Gastrointestinal: Negative for nausea and vomiting.  Musculoskeletal: Negative for back pain.  Hematological: Does not bruise/bleed easily.  All other systems reviewed and are negative.   Physical Exam Updated Vital Signs BP 109/81 (BP Location: Left Arm)   Pulse (!) 115   Temp  97.6 F (36.4 C) (Oral)   Resp 20   Ht 5' 10.5" (1.791 m)   Wt 73.1 kg   SpO2 100%   BMI 22.80 kg/m   Physical Exam Vitals and nursing note reviewed.  Constitutional:      Appearance: He is well-developed.  HENT:     Head: Atraumatic.  Neck:     Vascular: JVD present.  Cardiovascular:     Rate and Rhythm: Normal rate.  Pulmonary:     Effort: Pulmonary effort is normal.     Breath sounds: Examination of the right-lower field reveals decreased breath sounds. Examination of the left-lower field reveals decreased breath sounds. Decreased breath sounds present. No rales.  Musculoskeletal:     Cervical back: Neck supple.     Right lower leg: No edema.     Left lower leg: No edema.  Skin:    General: Skin is warm.  Neurological:     Mental Status: He is alert and oriented to person, place, and time.     ED Results / Procedures / Treatments   Labs (all labs ordered are listed, but only abnormal results are displayed) Labs Reviewed  BASIC METABOLIC PANEL - Abnormal; Notable for the following components:      Result Value   Potassium 3.2 (*)    Chloride 89 (*)    Glucose, Bld 128 (*)    BUN 40 (*)    Creatinine, Ser 1.75 (*)    GFR calc non Af Amer 43 (*)    GFR calc Af Amer 50 (*)    All other components within normal limits  CBC - Abnormal; Notable for the following components:   RDW 18.4 (*)    All other components within normal limits  BRAIN NATRIURETIC PEPTIDE - Abnormal; Notable for the following components:   B Natriuretic Peptide 913.0 (*)    All other components within normal limits  DIGOXIN LEVEL - Abnormal; Notable for the following components:   Digoxin Level <0.2 (*)    All other components within normal limits  CBC - Abnormal; Notable for the following components:   RDW 18.2 (*)    All other components within normal limits  COMPREHENSIVE METABOLIC PANEL - Abnormal; Notable for the following components:   Sodium 132 (*)    Chloride 91 (*)    Glucose,  Bld 190 (*)    BUN 36 (*)    Creatinine, Ser 1.76 (*)    Calcium 8.4 (*)    Total Protein 6.1 (*)    Albumin 2.9 (*)    AST 46 (*)    Total Bilirubin 1.8 (*)    GFR calc non Af Amer 43 (*)    GFR calc Af Amer 50 (*)    Anion gap 17 (*)    All other components within normal limits  LACTIC ACID, PLASMA - Abnormal; Notable for the following components:   Lactic Acid, Venous 2.6 (*)    All other components within normal limits  RAPID URINE DRUG SCREEN, HOSP PERFORMED - Abnormal; Notable for the following components:   Cocaine POSITIVE (*)    All other components within normal limits  BASIC METABOLIC PANEL - Abnormal; Notable for the following components:   Sodium 134 (*)    Potassium 3.4 (*)    Chloride 91 (*)    Glucose, Bld 159 (*)    BUN 35 (*)    Creatinine, Ser 1.64 (*)    GFR calc non Af Amer 47 (*)    GFR calc Af Amer 54 (*)    All other components within normal limits  TROPONIN I (HIGH SENSITIVITY) - Abnormal; Notable for the following components:   Troponin I (High Sensitivity) 109 (*)    All other components within normal limits  TROPONIN I (HIGH SENSITIVITY) - Abnormal; Notable for the following components:   Troponin I (High Sensitivity) 110 (*)    All other components within normal limits  SARS CORONAVIRUS 2 BY RT PCR (HOSPITAL ORDER, [facility])  MRSA PCR SCREENING  COOXEMETRY PANEL  MAGNESIUM  BASIC METABOLIC PANEL  CBC  COOXEMETRY PANEL    EKG EKG Interpretation  Date/Time:  Saturday June 22 2020 01:06:28 EDT Ventricular Rate:  111 PR Interval:  172 QRS Duration: 101 QT Interval:  315 QTC Calculation: 428 R Axis:   -81 Text Interpretation: Sinus tachycardia Prolonged PR interval Biatrial enlargement Left anterior fascicular  block Left ventricular hypertrophy Anterior infarct, old No acute changes Nonspecific ST and T wave abnormality No significant change since last tracing Confirmed by Varney Biles [id number]) on 06/22/2020  2:10:03 AM   Radiology DG Chest 2 View  Result Date: 06/21/2020 CLINICAL DATA:  Left-sided chest pain with shortness of breath for a few days. History of COPD. EXAM: CHEST - 2 VIEW COMPARISON:  Radiographs 04/12/2020 and 04/09/2020.  CT 04/09/2020. FINDINGS: PICC has been removed. There is stable moderate cardiomegaly with vascular congestion and chronic interstitial prominence. This does not appear significantly changed over the last several chest radiographs. Stable linear right upper lobe scarring. There is no confluent airspace opacity, pleural effusion or pneumothorax. The bones appear unchanged. IMPRESSION: Stable cardiomegaly, vascular congestion and chronic interstitial prominence. No acute findings. Electronically Signed   By: Richardean Sale M.D.   On: 06/21/2020 15:45   [place] EKG SITE RITE  Result Date: 06/22/2020 If Site Rite image not attached, placement could not be confirmed due to current cardiac rhythm.   Procedures Procedures (including critical care time)  Medications Ordered in ED Medications  sodium chloride flush (NS) 0.9 % injection 3 mL (3 mLs Intravenous Not Given 06/22/20 2104)  sodium chloride flush (NS) 0.9 % injection 3 mL (has no administration in time range)  0.9 %  sodium chloride infusion (has no administration in time range)  acetaminophen (TYLENOL) tablet 650 mg (has no administration in time range)  heparin injection 5,000 Units (5,000 Units Subcutaneous Given 06/22/20 2103)  digoxin (LANOXIN) tablet 0.125 mg (0.125 mg Oral Given 06/22/20 0846)  aspirin EC tablet 81 mg (81 mg Oral Given 06/22/20 0846)  atorvastatin (LIPITOR) tablet 80 mg (80 mg Oral Given 06/22/20 1712)  docusate sodium (COLACE) capsule 100 mg (100 mg Oral Given 06/22/20 2004)  pantoprazole (PROTONIX) EC tablet 40 mg (40 mg Oral Given 06/22/20 0846)  multivitamin with minerals tablet 1 tablet (1 tablet Oral Given 06/22/20 0845)  albuterol (PROVENTIL) (2.5 MG/3ML) 0.083% nebulizer solution 2.5 mg  (has no administration in time range)  furosemide (LASIX) injection 40 mg (40 mg Intravenous Given 06/22/20 1203)  sodium chloride flush (NS) 0.9 % injection 10-40 mL (10 mLs Intracatheter Given 06/22/20 2103)  sodium chloride flush (NS) 0.9 % injection 10-40 mL (has no administration in time range)  Chlorhexidine Gluconate Cloth 2 % PADS 6 each (6 each Topical Given 06/22/20 1453)  milrinone (PRIMACOR) 20 MG/100 ML (0.2 mg/mL) infusion (0.25 mcg/kg/min  73.1 kg Intravenous New Bag/Given 06/22/20 1458)  albuterol (VENTOLIN HFA) 108 (90 Base) MCG/ACT inhaler 4 puff (4 puffs Inhalation Given 06/22/20 0132)  potassium chloride SA (KLOR-CON) CR tablet 20 mEq (20 mEq Oral Given 06/22/20 7026)    ED Course  I have reviewed the triage vital signs and the nursing notes.  Pertinent labs & imaging results that were available during my care of the patient were reviewed by me and considered in my medical decision making (see chart for details).    MDM Rules/Calculators/A&P                          54 year old comes in a chief complaint of shortness of breath.  He has history of advanced CHF, COPD, CAD.  Patient is having orthopnea and exertional dyspnea without any chest pain.  He does admit to cocaine use and active cigarette smoking.  On exam there is no clear evidence of volume overload besides mild JVD.  No pitting edema, no rales.  Diminished breath sounds at the bases.   X-ray reviewed.  No signs of pneumonia. Delta troponin are flat.  Doubt ACS at this time. BNP is pending.  Breathing treatment provided.  We will reassess the patient to see if we need cardiology consult and admission versus discharge.  Reassessment: Cardiology team has seen the patient and they have decided to admit him as observation.  They are focusing on med optimization.  Final Clinical Impression(s) / ED Diagnoses Final diagnoses:  Acute on chronic systolic CHF (congestive heart failure) ([place])    Rx / DC Orders ED  Discharge Orders    None       Varney Biles, MD 06/22/20 2312

## 2020-06-22 NOTE — Progress Notes (Signed)
Progress Note  Patient Name: Tyler Wong Date of Encounter: 06/22/2020  Primary Cardiologist: Dr. Shelva Majestic / Dr. Loralie Champagne  Subjective   Lying on his side in bed, nearly supine.  Reports shortness of breath was main reason for presentation, no chest pain or syncope.  Inpatient Medications    Scheduled Meds: . aspirin EC  81 mg Oral Daily  . atorvastatin  80 mg Oral q1800  . digoxin  0.125 mg Oral Daily  . heparin  5,000 Units Subcutaneous Q8H  . multivitamin with minerals  1 tablet Oral Daily  . pantoprazole  40 mg Oral Daily  . sodium chloride flush  3 mL Intravenous Q12H   Continuous Infusions: . sodium chloride     PRN Meds: sodium chloride, acetaminophen, albuterol, docusate sodium, sodium chloride flush   Vital Signs    Vitals:   06/22/20 0659 06/22/20 0741 06/22/20 0800 06/22/20 0939  BP: 116/87 92/73  99/79  Pulse:  (!) 115 60 100  Resp: 20 20 20 18   Temp: 98.3 F (36.8 C) 98.3 F (36.8 C)    TempSrc: Oral Oral    SpO2: 100% 100% 100% 100%  Weight: 73.1 kg     Height:        Intake/Output Summary (Last 24 hours) at 06/22/2020 1055 Last data filed at 06/22/2020 0800 Gross per 24 hour  Intake 240 ml  Output 300 ml  Net -60 ml   Filed Weights   06/21/20 1453 06/22/20 0659  Weight: 79.8 kg 73.1 kg    Telemetry    Sinus rhythm and sinus tachycardia.  Personally reviewed.  ECG    An ECG dated 06/22/2020 was personally reviewed today and demonstrated:  Sinus tachycardia with LVH, poor R wave progression rule out old anterior infarct pattern, biatrial enlargement, repolarization abnormalities.  Physical Exam   GEN: No acute distress.   Neck:  Mildly elevated JVP. Cardiac: RRR, 3-2/2 apical systolic murmur, S3 gallop.  Respiratory: Nonlabored. Clear to auscultation bilaterally. GI: Soft, nontender, bowel sounds present. MS:  Trace ankle edema; No deformity. Neuro:  Nonfocal. Psych: Alert and oriented x 3. Normal affect.  Labs      Chemistry Recent Labs  Lab 06/21/20 1508 06/22/20 0515  NA 135 132*  K 3.2* 4.6  CL 89* 91*  CO2 31 24  GLUCOSE 128* 190*  BUN 40* 36*  CREATININE 1.75* 1.76*  CALCIUM 9.2 8.4*  PROT  --  6.1*  ALBUMIN  --  2.9*  AST  --  46*  ALT  --  34  ALKPHOS  --  81  BILITOT  --  1.8*  GFRNONAA 43* 43*  GFRAA 50* 50*  ANIONGAP 15 17*     Hematology Recent Labs  Lab 06/21/20 1508 06/22/20 0515  WBC 9.5 8.1  RBC 5.02 4.64  HGB 14.4 13.4  HCT 44.6 41.0  MCV 88.8 88.4  MCH 28.7 28.9  MCHC 32.3 32.7  RDW 18.4* 18.2*  PLT 281 232    Cardiac Enzymes Recent Labs  Lab 06/21/20 1508 06/21/20 1933  TROPONINIHS 109* 110*    BNP Recent Labs  Lab 06/22/20 0105  BNP 913.0*     Radiology    DG Chest 2 View  Result Date: 06/21/2020 CLINICAL DATA:  Left-sided chest pain with shortness of breath for a few days. History of COPD. EXAM: CHEST - 2 VIEW COMPARISON:  Radiographs 04/12/2020 and 04/09/2020.  CT 04/09/2020. FINDINGS: PICC has been removed. There is stable moderate cardiomegaly with vascular  congestion and chronic interstitial prominence. This does not appear significantly changed over the last several chest radiographs. Stable linear right upper lobe scarring. There is no confluent airspace opacity, pleural effusion or pneumothorax. The bones appear unchanged. IMPRESSION: Stable cardiomegaly, vascular congestion and chronic interstitial prominence. No acute findings. Electronically Signed   By: Richardean Sale M.D.   On: 06/21/2020 15:45    Cardiac Studies   Echocardiogram 04/10/2020: 1. Left ventricular ejection fraction, by estimation, is <20%. The left  ventricle has severely decreased function. The left ventricle demonstrates  global hypokinesis. The left ventricular internal cavity size was  moderately dilated. There is mild left  ventricular hypertrophy. Left ventricular diastolic parameters are  consistent with Grade II diastolic dysfunction  (pseudonormalization).  2. Right ventricular systolic function is mildly reduced. The right  ventricular size is moderately enlarged. There is moderately elevated  pulmonary artery systolic pressure.  3. Left atrial size was moderately dilated.  4. Right atrial size was moderately dilated.  5. The mitral valve is normal in structure. Moderate mitral valve  regurgitation. No evidence of mitral stenosis.  6. Tricuspid valve regurgitation is severe.  7. The aortic valve is normal in structure. Aortic valve regurgitation is  not visualized. No aortic stenosis is present.   Patient Profile     54 y.o. 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  He progresses with progressive shortness of breath.  Assessment & Plan    1.  Acute on chronic combined heart failure, history of low output with admission to the hospital in April on milrinone.  Last visit in the heart failure clinic was in April with subsequent lab work showing worsening renal function and hyperkalemia which resulted in discontinuation of losartan, Aldactone, and Lanoxin.  Last LVEF was less than 20% with moderate diastolic dysfunction and mild RV dysfunction.  Does not appear markedly fluid overloaded now, perhaps mildly so in terms of vascular congestion and trace ankle edema.  Main concern is for low output, not felt to be a good candidate for advanced strategies or even home milrinone due to his cocaine use.  Lactic acid 2.6.  2.  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  3.  CAD status post anterior STEMI with DES to the LAD in 2015, follow-up cardiac catheterization in 2019 showing 90% ostial OM1 and 70% PLV, managed  medically.  4.  CKD stage IIIb, creatinine up to 2.08 in late April resulting in medication adjustments as noted above, currently 1.76.  Case reviewed and discussed with Dr. Aundra Dubin.  I would suggest placing a PICC line with cooximetry and most likely start him back on milrinone at least in the short-term to get him further stabilized.  We will check a UDS to reassess cocaine use.  There will need to be further discussion possibly with palliative care consultation as it has not been felt that he is a good candidate for advanced therapies or home milrinone.  Continue Lanoxin but hold off on Aldactone, ARB, and beta-blocker.  Start IV Lasix at low-dose for now.  Signed, Rozann Lesches, MD  06/22/2020, 10:55 AM

## 2020-06-22 NOTE — Progress Notes (Addendum)
   06/22/20 2326  Assess: MEWS Score  Temp 97.9 F (36.6 C)  BP (!) 79/64  Pulse Rate (!) 107  ECG Heart Rate (!) 108  Resp (!) 26  Level of Consciousness Alert  SpO2 99 %  O2 Device Room Air  Assess: MEWS Score  MEWS Temp 0  MEWS Systolic 2  MEWS Pulse 1  MEWS RR 2  MEWS LOC 0  MEWS Score 5  MEWS Score Color Red  Assess: if the MEWS score is Yellow or Red  Were vital signs taken at a resting state? Yes  Focused Assessment Documented focused assessment  Early Detection of Sepsis Score *See Row Information* Medium  MEWS guidelines implemented *See Row Information* Yes  Treat  MEWS Interventions Escalated (See documentation below)  Take Vital Signs  Increase Vital Sign Frequency  Red: Q 1hr X 4 then Q 4hr X 4, if remains red, continue Q 4hrs  Escalate  MEWS: Escalate Red: discuss with charge nurse/RN and provider, consider discussing with RRT  Notify: Charge Nurse/RN  Name of Charge Nurse/RN Optometrist RN  Date Charge Nurse/RN Notified 06/22/20  Time Charge Nurse/RN Notified 2330  Notify: Provider  Provider Name/Title Dr. Marletta Lor  Date Provider Notified 06/22/20  Time Provider Notified 2330  Notification Type Page  Notification Reason Change in status  Response See new orders  Date of Provider Response 06/22/20  Time of Provider Response 2339   Pt hypotensive. Pt aysymptomatic. Dr. Marletta Lor paged. 125 ml bolus of NS given. Will continue to monitor.

## 2020-06-22 NOTE — Plan of Care (Signed)
  Problem: Education: Goal: Knowledge of General Education information will improve Description: Including pain rating scale, medication(s)/side effects and non-pharmacologic comfort measures Outcome: Progressing   Problem: Health Behavior/Discharge Planning: Goal: Ability to manage health-related needs will improve Outcome: Progressing   Problem: Clinical Measurements: Goal: Ability to maintain clinical measurements within normal limits will improve Outcome: Progressing   Problem: Clinical Measurements: Goal: Diagnostic test results will improve Outcome: Progressing   Problem: Clinical Measurements: Goal: Cardiovascular complication will be avoided Outcome: Progressing   Problem: Nutrition: Goal: Adequate nutrition will be maintained Outcome: Progressing   Problem: Coping: Goal: Level of anxiety will decrease Outcome: Progressing   Problem: Pain Managment: Goal: General experience of comfort will improve Outcome: Progressing   Problem: Safety: Goal: Ability to remain free from injury will improve Outcome: Progressing   Problem: Skin Integrity: Goal: Risk for impaired skin integrity will decrease Outcome: Progressing

## 2020-06-22 NOTE — ED Notes (Signed)
First attempt to call report unsuccessful. 

## 2020-06-22 NOTE — ED Notes (Signed)
SDU Breakfast Ordered 

## 2020-06-23 LAB — CBC
HCT: 41.9 % (ref 39.0–52.0)
Hemoglobin: 13.5 g/dL (ref 13.0–17.0)
MCH: 28.7 pg (ref 26.0–34.0)
MCHC: 32.2 g/dL (ref 30.0–36.0)
MCV: 89 fL (ref 80.0–100.0)
Platelets: 257 10*3/uL (ref 150–400)
RBC: 4.71 MIL/uL (ref 4.22–5.81)
RDW: 17.6 % — ABNORMAL HIGH (ref 11.5–15.5)
WBC: 9.5 10*3/uL (ref 4.0–10.5)
nRBC: 0 % (ref 0.0–0.2)

## 2020-06-23 LAB — BASIC METABOLIC PANEL
Anion gap: 9 (ref 5–15)
BUN: 34 mg/dL — ABNORMAL HIGH (ref 6–20)
CO2: 32 mmol/L (ref 22–32)
Calcium: 8.9 mg/dL (ref 8.9–10.3)
Chloride: 92 mmol/L — ABNORMAL LOW (ref 98–111)
Creatinine, Ser: 1.62 mg/dL — ABNORMAL HIGH (ref 0.61–1.24)
GFR calc Af Amer: 55 mL/min — ABNORMAL LOW (ref >60)
GFR calc non Af Amer: 47 mL/min — ABNORMAL LOW (ref >60)
Glucose, Bld: 126 mg/dL — ABNORMAL HIGH (ref 70–99)
Potassium: 3.3 mmol/L — ABNORMAL LOW (ref 3.5–5.1)
Sodium: 133 mmol/L — ABNORMAL LOW (ref 135–145)

## 2020-06-23 LAB — COOXEMETRY PANEL
Carboxyhemoglobin: 1.4 % (ref 0.5–1.5)
Methemoglobin: 1.3 % (ref 0.0–1.5)
O2 Saturation: 45.7 %
Total hemoglobin: 13.7 g/dL (ref 12.0–16.0)

## 2020-06-23 MED ORDER — POTASSIUM CHLORIDE CRYS ER 20 MEQ PO TBCR
40.0000 meq | EXTENDED_RELEASE_TABLET | Freq: Two times a day (BID) | ORAL | Status: DC
  Administered 2020-06-23 – 2020-06-26 (×7): 40 meq via ORAL
  Filled 2020-06-23 (×7): qty 2

## 2020-06-23 MED ORDER — SPIRONOLACTONE 12.5 MG HALF TABLET
12.5000 mg | ORAL_TABLET | Freq: Every day | ORAL | Status: DC
  Administered 2020-06-23 – 2020-06-26 (×4): 12.5 mg via ORAL
  Filled 2020-06-23 (×4): qty 1

## 2020-06-23 MED ORDER — FUROSEMIDE 10 MG/ML IJ SOLN
80.0000 mg | Freq: Two times a day (BID) | INTRAMUSCULAR | Status: DC
  Administered 2020-06-23 – 2020-06-26 (×6): 80 mg via INTRAVENOUS
  Filled 2020-06-23 (×6): qty 8

## 2020-06-23 NOTE — Plan of Care (Signed)
  Problem: Education: Goal: Knowledge of General Education information will improve Description: Including pain rating scale, medication(s)/side effects and non-pharmacologic comfort measures Outcome: Progressing   Problem: Clinical Measurements: Goal: Ability to maintain clinical measurements within normal limits will improve Outcome: Progressing   Problem: Clinical Measurements: Goal: Diagnostic test results will improve Outcome: Progressing   Problem: Clinical Measurements: Goal: Cardiovascular complication will be avoided Outcome: Progressing   Problem: Nutrition: Goal: Adequate nutrition will be maintained Outcome: Progressing   Problem: Coping: Goal: Level of anxiety will decrease Outcome: Progressing   Problem: Elimination: Goal: Will not experience complications related to urinary retention Outcome: Progressing   Problem: Pain Managment: Goal: General experience of comfort will improve Outcome: Progressing   Problem: Safety: Goal: Ability to remain free from injury will improve Outcome: Progressing

## 2020-06-23 NOTE — Progress Notes (Signed)
Pt had 9 beat run of non-sustained V-tach. Pt asymptomatic with no complaints of chest pain or SOB. Cardiology MD paged. Will continue to monitor.

## 2020-06-23 NOTE — Progress Notes (Addendum)
CVP started measured, current CVP 20, pt is still complaining SOB even saturation between 98-100, pt has a BM earlier and said he is feeling much better after that, ST on monitor, denies chest pain, will continue to monitor  Palma Holter, RN

## 2020-06-23 NOTE — Consult Note (Signed)
Advanced Heart Failure Team Consult Note   Primary Physician: [name], NP PCP-Cardiologist:  Shelva Majestic, MD  HF: Aundra Dubin  Consulting: Domenic Polite  Reason for Consultation: End-stage HF  HPI:    Tyler Wong is seen today for evaluation of end-stage HF at the request of Dr. Domenic Polite.   Tyler Wong is a 54 year old with a history of CAD, DES to LAD in 2015, HTN, cocaine, and chronic systolic heart failure due primarily to NICM.   Admitted 10/19 with ADHF and drop in EF. Cath: Nondominant RCA providing a PLV with 70% stenosis.  90% ostial moderate OM1.  LAD with patent stent. Will plan medical management of coronary disease.  No chest pain and does not explain fall in EF.   Readmitted 4/21 w/ acute on chronic systolic HF w/ low output and volume overload, in the setting of missed medications and cocaine use.UDS + cocaine. Echo with severe biventricular dysfunction. LVEF down to <20% (previously 40-45%). RV mildly reduce. Had low CO-OXand hypotension. Startedmilrinone 0.25 mcgand IV Lasix. Co-ox and CVPs monitored through PICC line. Co-ox improved w/ milrinone and he diuresed well. Lactic acid normalized. He was weaned of milrinone w/ marginal co-ox, however not a candidate for home inotropes nor advanced therapies due to drug abuse and poor compliance. He was placed on GDMT. He was able to tolerate losartan but BP was too soft, at the time, for [facility]. No?blocker due to low output. He was transitioned off of IV Lasix and to PO torsemide. Was discharged home on 4/19. D/c wt was 159 lb.   He followed up with HF after discharge and was noted, by labs, to have worsening AKI with Cr 2 and K 5.6. He was instructed to stop digoxin, spironolacone, and losartan. He was scheduled to return for follow up labs and appointment but he did not show. He was also non-responsive to Paramedicine calls   Over the last few weeks he has noted worsening SOB and chest pressure as well as some  abdominal fullness and bloating. He self-increased his torsemide to 60mg  twice a day (from 40mg  twice a day). Readmitted on 6/25 with worsening HF and sinus tach. Lactic acid 2.6. PICC placed and initial co-ox 37%. UDS + for cocaine again    Now on milrinone. Feeling a bit better. Diuresis has picked up. Co-ox 46%  CVP 15-16   Review of Systems: [y] = yes, [ ]  = no   . General: Weight gain [ ] ; Weight loss [ ] ; Anorexia [ ] ; Fatigue [ y]; Fever [ ] ; Chills [ ] ; Weakness [ ]   . Cardiac: Chest pain/pressure [ y]; Resting SOB Blue.Reese ]; Exertional SOB y[ ] ; Orthopnea Blue.Reese ]; Pedal Edema [ ] ; Palpitations [ ] ; Syncope [ ] ; Presyncope [ ] ; Paroxysmal nocturnal dyspnea[ ]   . Pulmonary: Cough Blue.Reese ]; Wheezing[ ] ; Hemoptysis[ ] ; Sputum [ ] ; Snoring [ ]   . GI: Vomiting[ ] ; Dysphagia[ ] ; Melena[ ] ; Hematochezia [ ] ; Heartburn[ ] ; Abdominal pain [ ] ; Constipation [ ] ; Diarrhea [ ] ; BRBPR [ ]   . GU: Hematuria[ ] ; Dysuria [ ] ; Nocturia[ ]   . Vascular: Pain in legs with walking [ ] ; Pain in feet with lying flat [ ] ; Non-healing sores [ ] ; Stroke [ ] ; TIA [ ] ; Slurred speech [ ] ;  . Neuro: Headaches[ ] ; Vertigo[ ] ; Seizures[ ] ; Paresthesias[ ] ;Blurred vision [ ] ; Diplopia [ ] ; Vision changes [ ]   . Ortho/Skin: Arthritis Blue.Reese ]; Joint pain [ y]; Muscle pain [ ] ;  Joint swelling [ ] ; Back Pain [ ] ; Rash [ ]   . Psych: Depression[ ] ; Anxiety[ ]   . Heme: Bleeding problems [ ] ; Clotting disorders [ ] ; Anemia [ ]   . Endocrine: Diabetes [ ] ; Thyroid dysfunction[ ]   Home Medications Prior to Admission medications   Medication Sig Start Date End Date Taking? Authorizing Provider  albuterol (VENTOLIN HFA) 108 (90 Base) MCG/ACT inhaler Inhale 2 puffs into the lungs every 4 (four) hours as needed for wheezing or shortness of breath. 01/24/20  Yes [name], NP  aspirin 81 MG EC tablet Take 1 tablet (81 mg total) by mouth daily. 04/25/20  Yes Rosita Fire, Brittainy M, PA-C  atorvastatin (LIPITOR) 80 MG tablet Take 1 tablet (80  mg total) by mouth daily. 04/25/20  Yes Lyda Jester M, PA-C  docusate sodium (COLACE) 100 MG capsule Take 100 mg by mouth daily as needed for mild constipation.   Yes [provider]  Multiple Vitamin (MULTIVITAMIN) tablet Take 1 tablet by mouth daily.   Yes [provider]  NITROSTAT 0.4 MG SL tablet PLACE 1 TABLET UNDER TONGUE AS NEEDED FOR CHEST PAIN EVERY 5 MINUTES X 3 MAX DOSES.  CALL 911 IF PAIN PERSISTS Patient taking differently: Place 0.4 mg under the tongue every 5 (five) minutes as needed for chest pain.  02/10/18  Yes [name], MD  omeprazole (PRILOSEC) 20 MG capsule Take 1 capsule (20 mg total) by mouth daily. Patient taking differently: Take 20 mg by mouth daily as needed (reflux/heartburn).  01/24/20  Yes [name], NP  torsemide (DEMADEX) 20 MG tablet Take 2 tablets (40 mg total) by mouth daily. 04/29/20  Yes Consuelo Pandy, PA-C    Past Medical History: Past Medical History:  Diagnosis Date  . CAD in native artery 07/17/14   STEMI- LAD stenosis with DES  . CHF (congestive heart failure) ([place])   . Cocaine abuse ([place])   . COPD (chronic obstructive pulmonary disease) ([place])   . Dyspnea   . Hypertension   . ST elevation myocardial infarction (STEMI) involving left anterior descending (LAD) coronary artery with complication ([place])   . Tobacco abuse 07/17/2014    Past Surgical History: Past Surgical History:  Procedure Laterality Date  . CORONARY ANGIOPLASTY WITH STENT PLACEMENT  07/18/14   resolute DES to LAD STEMI  . LEFT HEART CATH N/A 07/17/2014   Procedure: LEFT HEART CATH;  Surgeon: [name], MD;  Location: [facility] CATH LAB;  Service: Cardiovascular;  Laterality: N/A;  . PERCUTANEOUS CORONARY STENT INTERVENTION (PCI-S)  07/17/2014   Procedure: PERCUTANEOUS CORONARY STENT INTERVENTION (PCI-S);  Surgeon: [name], MD;  Location: [facility] CATH LAB;  Service: Cardiovascular;;  . RIGHT/LEFT HEART CATH AND CORONARY ANGIOGRAPHY N/A  10/03/2018   Procedure: RIGHT/LEFT HEART CATH AND CORONARY ANGIOGRAPHY;  Surgeon: [name], MD;  Location: [facility] CV LAB;  Service: Cardiovascular;  Laterality: N/A;    Family History: Family History  Problem Relation Age of Onset  . Cirrhosis Father   . Heart attack Mother   . Heart attack Sister   . Heart failure Sister   . Heart attack Brother     Social History: Social History   Socioeconomic History  . Marital status: Single    Spouse name: Not on file  . Number of children: 2  . Years of education: 51  . Highest education level: 12th grade  Occupational History  . Occupation: Landscaping  Tobacco Use  . Smoking status: Current Every  Day Smoker    Packs/day: 0.50    Years: 30.00    Pack years: 15.00    Types: Cigarettes  . Smokeless tobacco: Never Used  . Tobacco comment: .5 PK PER DAY  Vaping Use  . Vaping Use: Never used  Substance and Sexual Activity  . Alcohol use: No    Comment: Patient denies abuse, states social drinker  . Drug use: Not Currently    Types: Cocaine    Comment: heroin  . Sexual activity: Yes  Other Topics Concern  . Not on file  Social History Narrative   Lives in [place] with his sister and girlfriend. He works Aeronautical engineer work.    Social Determinants of Health   Financial Resource Strain:   . Difficulty of Paying Living Expenses:   Food Insecurity: Food Insecurity Present  . Worried About Charity fundraiser in the Last Year: Sometimes true  . Ran Out of Food in the Last Year: Never true  Transportation Needs: No Transportation Needs  . Lack of Transportation (Medical): No  . Lack of Transportation (Non-Medical): No  Physical Activity:   . Days of Exercise per Week:   . Minutes of Exercise per Session:   Stress:   . Feeling of Stress :   Social Connections:   . Frequency of Communication with Friends and Family:   . Frequency of Social Gatherings with Friends and Family:   . Attends Religious  Services:   . Active Member of Clubs or Organizations:   . Attends Archivist Meetings:   Marland Kitchen Marital Status:     Allergies:  No Known Allergies  Objective:    Wong Signs:   Temp:  [97.6 F (36.4 C)-98 F (36.7 C)] 97.8 F (36.6 C) (06/27 1146) Pulse Rate:  [60-127] 110 (06/27 1200) Resp:  [16-26] 16 (06/27 1200) BP: (69-113)/(51-100) 84/64 (06/27 1200) SpO2:  [91 %-100 %] 97 % (06/27 1200) Weight:  [75.1 kg] 75.1 kg (06/27 0405) Last BM Date: 06/22/20  Weight change: Filed Weights   06/21/20 1453 06/22/20 0659 06/23/20 0405  Weight: 79.8 kg 73.1 kg 75.1 kg    Intake/Output:   Intake/Output Summary (Last 24 hours) at 06/23/2020 1237 Last data filed at 06/23/2020 1148 Gross per 24 hour  Intake 1961.3 ml  Output 1900 ml  Net 61.3 ml      Physical Exam    General: Lying in bed No resp difficulty HEENT: normal Neck: supple. JVP to jaw prominent CV waves . Carotids 2+ bilat; no bruits. No lymphadenopathy or thyromegaly appreciated. Cor: PMI nondisplaced. Tachy regular + s3. 3/6 Tyler Lungs: clear Abdomen: soft, nontender, nondistended. No hepatosplenomegaly. No bruits or masses. Good bowel sounds. Extremities: no cyanosis, clubbing, rash, 1+ edema cool  Neuro: alert & orientedx3, cranial nerves grossly intact. moves all 4 extremities w/o difficulty. Affect pleasant   Telemetry   Sinus tach 110-120 Personally reviewed  Labs   Basic Metabolic Panel: Recent Labs  Lab 06/21/20 1508 06/21/20 1508 06/22/20 0515 06/22/20 1851 06/23/20 0406  NA 135  --  132* 134* 133*  K 3.2*  --  4.6 3.4* 3.3*  CL 89*  --  91* 91* 92*  CO2 31  --  24 32 32  GLUCOSE 128*  --  190* 159* 126*  BUN 40*  --  36* 35* 34*  CREATININE 1.75*  --  1.76* 1.64* 1.62*  CALCIUM 9.2   < > 8.4* 9.0 8.9  MG  --   --   --  1.8  --    < > = values in this interval not displayed.    Liver Function Tests: Recent Labs  Lab 06/22/20 0515  AST 46*  ALT 34  ALKPHOS 81  BILITOT 1.8*   PROT 6.1*  ALBUMIN 2.9*   No results for input(s): LIPASE, AMYLASE in the last 168 hours. No results for input(s): AMMONIA in the last 168 hours.  CBC: Recent Labs  Lab 06/21/20 1508 06/22/20 0515 06/23/20 0406  WBC 9.5 8.1 9.5  HGB 14.4 13.4 13.5  HCT 44.6 41.0 41.9  MCV 88.8 88.4 89.0  PLT 281 232 257    Cardiac Enzymes: No results for input(s): CKTOTAL, CKMB, CKMBINDEX, TROPONINI in the last 168 hours.  BNP: BNP (last 3 results) Recent Labs    04/01/20 0629 04/09/20 1209 06/22/20 0105  BNP 773.7* 1,195.9* 913.0*    ProBNP (last 3 results) Recent Labs    01/30/20 1645  PROBNP 1,759*     CBG: No results for input(s): GLUCAP in the last 168 hours.  Coagulation Studies: No results for input(s): LABPROT, INR in the last 72 hours.   Imaging    No results found.   Medications:     Current Medications: . aspirin EC  81 mg Oral Daily  . atorvastatin  80 mg Oral q1800  . Chlorhexidine Gluconate Cloth  6 each Topical Daily  . digoxin  0.125 mg Oral Daily  . furosemide  40 mg Intravenous Daily  . heparin  5,000 Units Subcutaneous Q8H  . multivitamin with minerals  1 tablet Oral Daily  . pantoprazole  40 mg Oral Daily  . sodium chloride flush  10-40 mL Intracatheter Q12H  . sodium chloride flush  3 mL Intravenous Q12H     Infusions: . sodium chloride    . milrinone 0.25 mcg/kg/min (06/23/20 0504)       Assessment/Plan    1. Acute on chronic systolic CHF -> cardiogenic shock:  - Echo in 2015 with EF 40-45%, thought to be ischemic cardiomyopathy.  - Ech 4/21 EF down to<20% with mild RV dysfunction. - Cath in 10/19 showed 90% ostial OM1 and 70% PLV, no interventional target. Suspect mixed ischemic/nonischemic cardiomyopathy. 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 - Cocaine likely plays a role in his cardiomyopathy (actively using).  - Failed outpatient management due to  AKI and poor compliance - Now with recurrent cardiogenic shock with co-ox 37%. Back on milrinone per primary team -As I discussed with him and his sister (and the Palliative Care team) during his last admission, he is not a candidate for advanced therapies or home milrinone with active use of cocaine and noncompliance - He has again failed outpatient management. Only option seems to be Hospice. I discussed this with him openly and he became quite upset. Does not want Palliative Care involved. Says he is not ready to give up but if there are no options doesn't want to be a burden on anyone else.  - Will continue milrinone for now in an attempt to optimize in house. - Continue dig. Add spiro.  - Will likely try to use low-dose Bidil 2. CAD: H/o anterior STEMI with DES to LAD in 7/15.As above, cath in 10/19 with 90% ostial OM1 and 70% PLV. Medical therapy.  -ContinueASA 81 and atorvastatin 80 mg daily.  - No further CP  3. Cocaine abuse: Counseled to quit. But continues to use 4. Active smoker: Discussed need for  cessation 5. Acute on CKD 3a due to cardiorenal/ATN - baseline creatinine ~1.4 - 2.1 on admit now 1.7 6. Hypokalemia/hyperkalemia - Will supp today  CRITICAL CARE Performed by: Glori Bickers  Total critical care time: 35 minutes  Critical care time was exclusive of separately billable procedures and treating other patients.  Critical care was necessary to treat or prevent imminent or life-threatening deterioration.  Critical care was time spent personally by me (independent of midlevel providers or residents) on the following activities: development of treatment plan with patient and/or surrogate as well as nursing, discussions with consultants, evaluation of patient's response to treatment, examination of patient, obtaining history from patient or surrogate, ordering and performing treatments and interventions, ordering and review of laboratory studies, ordering and review  of radiographic studies, pulse oximetry and re-evaluation of patient's condition.    Length of Stay: 1  Glori Bickers, MD  06/23/2020, 12:37 PM  Advanced Heart Failure Team Pager [phone] (M-F; 7a - 4p)  Please contact [facility] for night-coverage after hours (4p -7a ) and weekends on [email or web address]

## 2020-06-23 NOTE — Progress Notes (Addendum)
Pt is running ST on monitor, complaining SOB, but sat is 95-100 in RA, oxygen provided for comfort, pt seems very anxious, moving a lot in a bed, Denies chest pain, getting up for bathroom, BP's running soft, holding Lasix this am after talking with PA, Pt's BP still soft, will continue to monitor  Informed to Rapid response nurse regarding his status.  Palma Holter, RN

## 2020-06-23 NOTE — Significant Event (Signed)
Rapid Response Event Note  Overview: Time Called: 8828 Arrival Time: 0034 Delayed in arrival due to tending to another emergency. Event Type: Hypotension Pt BP was 69/57 at 0800, at 0900 99/62.   Initial Focused Assessment: Pt lying in bed, awake. He is alert and oriented- moving all extremities appropriately. Pt states that he was able to have a bowel movement this morning, but his stomach still feels a little tender. Pt is concerned that he is accumulating fluid in his abdomen. He denies pain to palpation of his abdomen. Abdomen is soft. Lung sounds are clear. Pt breathing appears mildly labored, but he denies shortness of breath. BP appears to be trending up at this time. Pt is currently on a Milrinone gtt and primary RN received orders to hold his morning dose of lasix while he is hypotensive.   Interventions: -No intervention from rapid response RN  Plan of Care (if not transferred): -Follow up with attending regarding administration of lasix. Pt is requesting he get his lasix.  -Increase frequency of BP checks -Closely monitor pt for increased work of breathing and shortness of breath  Call rapid response for additional needs.   Event Summary: Primary RN has been in touch with attending team. Event End Time: [place]

## 2020-06-24 LAB — BASIC METABOLIC PANEL
Anion gap: 8 (ref 5–15)
BUN: 33 mg/dL — ABNORMAL HIGH (ref 6–20)
CO2: 30 mmol/L (ref 22–32)
Calcium: 8.8 mg/dL — ABNORMAL LOW (ref 8.9–10.3)
Chloride: 99 mmol/L (ref 98–111)
Creatinine, Ser: 1.8 mg/dL — ABNORMAL HIGH (ref 0.61–1.24)
GFR calc Af Amer: 48 mL/min — ABNORMAL LOW (ref >60)
GFR calc non Af Amer: 42 mL/min — ABNORMAL LOW (ref >60)
Glucose, Bld: 139 mg/dL — ABNORMAL HIGH (ref 70–99)
Potassium: 4.1 mmol/L (ref 3.5–5.1)
Sodium: 137 mmol/L (ref 135–145)

## 2020-06-24 LAB — COOXEMETRY PANEL
Carboxyhemoglobin: 1.2 % (ref 0.5–1.5)
Methemoglobin: 1 % (ref 0.0–1.5)
O2 Saturation: 47.8 %
Total hemoglobin: 13.1 g/dL (ref 12.0–16.0)

## 2020-06-24 NOTE — Progress Notes (Addendum)
Advanced Heart Failure Rounding Note  PCP-Cardiologist: Shelva Majestic, MD   Subjective:    CO-OX 48% on milrinone 0.25 mcg.   Diuresing with IV lasix. Neg 1.3 liters.    Complaining of belly discomfort. Denies SOB.  Objective:   Weight Range: 75 kg Body mass index is 23.39 kg/m.   Vital Signs:   Temp:  [97.5 F (36.4 C)-97.8 F (36.6 C)] 97.5 F (36.4 C) (06/28 0332) Pulse Rate:  [29-127] 120 (06/28 0655) Resp:  [13-24] 20 (06/28 0655) BP: (69-113)/(49-100) 91/61 (06/28 0655) SpO2:  [89 %-100 %] 99 % (06/28 0655) Weight:  [75 kg] 75 kg (06/28 0332) Last BM Date: 06/23/20  Weight change: Filed Weights   06/22/20 0659 06/23/20 0405 06/24/20 0332  Weight: 73.1 kg 75.1 kg 75 kg    Intake/Output:   Intake/Output Summary (Last 24 hours) at 06/24/2020 0755 Last data filed at 06/24/2020 0500 Gross per 24 hour  Intake 1097.9 ml  Output 2350 ml  Net -1252.1 ml      Physical Exam   CVP 12 personally reviewed.  General:  Well appearing. No resp difficulty HEENT: Normal Neck: Supple. JVP 11-12  . Carotids 2+ bilat; no bruits. No lymphadenopathy or thyromegaly appreciated. Cor: PMI nondisplaced. Regular rate & rhythm. No rubs, or murmurs. +S3  Lungs: Clear Abdomen: Soft, nontender, nondistended. No hepatosplenomegaly. No bruits or masses. Good bowel sounds. Extremities: No cyanosis, clubbing, rash, edema. RUE PICC  Neuro: Alert & orientedx3, cranial nerves grossly intact. moves all 4 extremities w/o difficulty. Affect pleasant   Telemetry   SR/ST 90-100s   EKG   n/a  Labs    CBC Recent Labs    06/22/20 0515 06/23/20 0406  WBC 8.1 9.5  HGB 13.4 13.5  HCT 41.0 41.9  MCV 88.4 89.0  PLT 232 716   Basic Metabolic Panel Recent Labs    06/22/20 1851 06/22/20 1851 06/23/20 0406 06/24/20 0245  NA 134*   < > 133* 137  K 3.4*   < > 3.3* 4.1  CL 91*   < > 92* 99  CO2 32   < > 32 30  GLUCOSE 159*   < > 126* 139*  BUN 35*   < > 34* 33*  CREATININE  1.64*   < > 1.62* 1.80*  CALCIUM 9.0   < > 8.9 8.8*  MG 1.8  --   --   --    < > = values in this interval not displayed.   Liver Function Tests Recent Labs    06/22/20 0515  AST 46*  ALT 34  ALKPHOS 81  BILITOT 1.8*  PROT 6.1*  ALBUMIN 2.9*   No results for input(s): LIPASE, AMYLASE in the last 72 hours. Cardiac Enzymes No results for input(s): CKTOTAL, CKMB, CKMBINDEX, TROPONINI in the last 72 hours.  BNP: BNP (last 3 results) Recent Labs    04/01/20 0629 04/09/20 1209 06/22/20 0105  BNP 773.7* 1,195.9* 913.0*    ProBNP (last 3 results) Recent Labs    01/30/20 1645  PROBNP 1,759*     D-Dimer No results for input(s): DDIMER in the last 72 hours. Hemoglobin A1C No results for input(s): HGBA1C in the last 72 hours. Fasting Lipid Panel No results for input(s): CHOL, HDL, LDLCALC, TRIG, CHOLHDL, LDLDIRECT in the last 72 hours. Thyroid Function Tests No results for input(s): TSH, T4TOTAL, T3FREE, THYROIDAB in the last 72 hours.  Invalid input(s): FREET3  Other results:   Imaging     No  results found.   Medications:     Scheduled Medications: . aspirin EC  81 mg Oral Daily  . atorvastatin  80 mg Oral q1800  . Chlorhexidine Gluconate Cloth  6 each Topical Daily  . digoxin  0.125 mg Oral Daily  . furosemide  80 mg Intravenous BID  . heparin  5,000 Units Subcutaneous Q8H  . multivitamin with minerals  1 tablet Oral Daily  . pantoprazole  40 mg Oral Daily  . potassium chloride  40 mEq Oral BID  . sodium chloride flush  10-40 mL Intracatheter Q12H  . sodium chloride flush  3 mL Intravenous Q12H  . spironolactone  12.5 mg Oral Daily     Infusions: . sodium chloride    . milrinone 0.25 mcg/kg/min (06/23/20 2322)     PRN Medications:  sodium chloride, acetaminophen, albuterol, docusate sodium, sodium chloride flush, sodium chloride flush    Patient Profile  Mr Hosie is a 54 year old with a history of CAD, DES to LAD in 2015, HTN, cocaine,  and chronic systolic heart failure due primarily to NICM.  Admitted with A/C Biventricular HF. Started on milrinone.    Assessment/Plan  . Acute on chronic systolic CHF -> cardiogenic shock:  - Echo in 2015 with EF 40-45%, thought to be ischemic cardiomyopathy.  - Ech 4/21 EF down to<20% with mild RV dysfunction. - Cath in 10/19 showed 90% ostial OM1 and 70% PLV, no interventional target. Suspect mixed ischemic/nonischemic cardiomyopathy. 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 - Cocaine likely plays a role in his cardiomyopathy (actively using).  - Failed outpatient management due to AKI and poor compliance - Now with recurrent cardiogenic shock with co-ox 37%. Back on milrinone per primary team.  - Todays CO-OX is 48%. Will continue milrinone for now in an attempt to optimize in house. - Continue dig. - Continue spiro.  - BP to soft for low-dose Bidil 2. CAD: H/o anterior STEMI with DES to LAD in 7/15.As above, cath in 10/19 with 90% ostial OM1 and 70% PLV. Medical therapy.  -ContinueASA 81 and atorvastatin 80 mg daily. - No chest pain 3. Cocaine abuse: Counseled to quit. But continues to use. + UDS on admit 4. Active smoker: Discussed need for cessation 5. Acute on CKD 3a due to cardiorenal/ATN - baseline creatinine ~1.4 - 2.1 on admit now 1.8 6. Hypokalemia/hyperkalemia - K stable today.  7. Goals of Care Not a candidate for advanced therapies or home milrinone with active use of cocaine and noncompliance - He has again failed outpatient management. Only option seems to be Hospice.  - He is not interested. Would like to consult Palliative Care however he declined.    Length of Stay: 2  Darrick Grinder, NP  06/24/2020, 7:55 AM  Advanced Heart Failure Team Pager [phone] (M-F; 7a - 4p)  Please contact [facility] for night-coverage after hours (4p -7a ) and weekends on [email or web address]  Patient seen and  examined with the above-signed Advanced Practice Provider and/or Housestaff. I personally reviewed laboratory data, imaging studies and relevant notes. I independently examined the patient and formulated the important aspects of the plan. I have edited the note to reflect any of my changes or salient points. I have personally discussed the plan with the patient and/or family.  Remains on milrinone. Co-ox 48%. CVP 12. C/o weakness and ab pain. Likely low output  General:  Weak appearing. No resp difficulty HEENT: normal Neck: supple. JVP  to jaw. Carotids 2+ bilat; no bruits. No lymphadenopathy or thryomegaly appreciated. Cor: PMI nondisplaced. Tachy regular + s3 3/6 MR Lungs: clear Abdomen: soft, nontender, nondistended. No hepatosplenomegaly. No bruits or masses. Good bowel sounds. Extremities: no cyanosis, clubbing, rash, tr edema cool  Neuro: alert & orientedx3, cranial nerves grossly intact. moves all 4 extremities w/o difficulty. Affect pleasant  He has end-stage low output HF in setting of ongoing cocaine use. No durable options remain. Refusing palliative care. Will try to optimize in house prior to d/c. Increase milrinone to 0.375. Continue IV lasix.   Glori Bickers, MD  8:30 AM

## 2020-06-24 NOTE — Progress Notes (Signed)
CARDIAC REHAB PHASE I   PRE:  Rate/Rhythm: 106 ST  BP:  Supine:   Sitting: 106/86  Standing:    SaO2: 96%RA  MODE:  Ambulation: 650 ft   POST:  Rate/Rhythm: 115-127 ST  BP:  Supine:   Sitting: 100/84  Standing:    SaO2: 96%RA 1015-1100 Pt walked 650 ft on RA with steady gait and tolerated well. No DOE but did c/o left foot arch bothering him and he was limping slightly. To bathroom after walk and then helped to sitting on side of bed. Pt given smoking cessation and low sodium handouts. Pt stated he has cut down on cigarettes.   Graylon Good, RN BSN  06/24/2020 10:56 AM

## 2020-06-24 NOTE — Plan of Care (Signed)

## 2020-06-25 DIAGNOSIS — Z03818 Encounter for observation for suspected exposure to other biological agents ruled out: Secondary | ICD-10-CM | POA: Diagnosis not present

## 2020-06-25 LAB — BASIC METABOLIC PANEL
Anion gap: 8 (ref 5–15)
BUN: 30 mg/dL — ABNORMAL HIGH (ref 6–20)
CO2: 28 mmol/L (ref 22–32)
Calcium: 9.3 mg/dL (ref 8.9–10.3)
Chloride: 101 mmol/L (ref 98–111)
Creatinine, Ser: 1.55 mg/dL — ABNORMAL HIGH (ref 0.61–1.24)
GFR calc Af Amer: 58 mL/min — ABNORMAL LOW (ref >60)
GFR calc non Af Amer: 50 mL/min — ABNORMAL LOW (ref >60)
Glucose, Bld: 129 mg/dL — ABNORMAL HIGH (ref 70–99)
Potassium: 4.8 mmol/L (ref 3.5–5.1)
Sodium: 137 mmol/L (ref 135–145)

## 2020-06-25 LAB — COOXEMETRY PANEL
Carboxyhemoglobin: 1.6 % — ABNORMAL HIGH (ref 0.5–1.5)
Methemoglobin: 1.3 % (ref 0.0–1.5)
O2 Saturation: 54.1 %
Total hemoglobin: 13.4 g/dL (ref 12.0–16.0)

## 2020-06-25 LAB — CBC
HCT: 39.5 % (ref 39.0–52.0)
Hemoglobin: 12.9 g/dL — ABNORMAL LOW (ref 13.0–17.0)
MCH: 29 pg (ref 26.0–34.0)
MCHC: 32.7 g/dL (ref 30.0–36.0)
MCV: 88.8 fL (ref 80.0–100.0)
Platelets: 242 10*3/uL (ref 150–400)
RBC: 4.45 MIL/uL (ref 4.22–5.81)
RDW: 17.7 % — ABNORMAL HIGH (ref 11.5–15.5)
WBC: 9.3 10*3/uL (ref 4.0–10.5)
nRBC: 0 % (ref 0.0–0.2)

## 2020-06-25 MED ORDER — METOLAZONE 2.5 MG PO TABS
2.5000 mg | ORAL_TABLET | Freq: Once | ORAL | Status: AC
  Administered 2020-06-25: 2.5 mg via ORAL
  Filled 2020-06-25: qty 1

## 2020-06-25 MED ORDER — ISOSORB DINITRATE-HYDRALAZINE 20-37.5 MG PO TABS
0.5000 | ORAL_TABLET | Freq: Three times a day (TID) | ORAL | Status: DC
  Administered 2020-06-25 – 2020-06-26 (×3): 0.5 via ORAL
  Filled 2020-06-25 (×3): qty 1

## 2020-06-25 NOTE — Plan of Care (Signed)

## 2020-06-25 NOTE — Plan of Care (Signed)
  Problem: Education: Goal: Knowledge of General Education information will improve Description: Including pain rating scale, medication(s)/side effects and non-pharmacologic comfort measures Outcome: Progressing   Problem: Health Behavior/Discharge Planning: Goal: Ability to manage health-related needs will improve Outcome: Progressing   Problem: Clinical Measurements: Goal: Will remain free from infection Outcome: Progressing Goal: Diagnostic test results will improve Outcome: Progressing Goal: Cardiovascular complication will be avoided Outcome: Progressing   Problem: Activity: Goal: Risk for activity intolerance will decrease Outcome: Progressing   Problem: Nutrition: Goal: Adequate nutrition will be maintained Outcome: Progressing   Problem: Coping: Goal: Level of anxiety will decrease Outcome: Progressing   Problem: Elimination: Goal: Will not experience complications related to urinary retention Outcome: Progressing   Problem: Pain Managment: Goal: General experience of comfort will improve Outcome: Progressing   Problem: Safety: Goal: Ability to remain free from injury will improve Outcome: Progressing

## 2020-06-25 NOTE — Progress Notes (Signed)
Outpatient CSW informed that pt has been admitted to the hospital.  CSW was following pt through outpatient paramedicine program but pt was discharged from program at the end of May when he stopped responding to communication efforts.    Pt reports that he started feeling better and so therefore stopped communicating with [place]- stated he let his medications run out but kept taking his torsemide and he was feeling good for awhile.  He also moved out of his long term partners home and moved in with his girlfriend in [place] for awhile.  Pt apologized for losing contact and recognizes that him not keeping up with [place] and with his medications contributed to his return to the hospital.    CSW also spoke with pt about his drug use.  Pt admits to slipping and starting to use cocaine again- understands the strong negative effect this has on his heart and expresses strong desire to stop using.  When CSW brought up inpatient or outpatient rehab he was initially against this option- feels as if he can be successful on his own.  Pt states he plans to return to living with his long term partner in [facility] which is away from areas he can obtain drugs easily and where he feels most supportive- thinks this will allow him to quit use.  Also feels as if this admission and what the MDs have told him about his health have scared him into stopping.  CSW and pt discussed for awhile and CSW pointed out that pt has said he was strongly motivated to stop due to his health in the past and was not successful and expressed concerns about him being unsuccessful on his own again this time.  Pt stated that he might be ok going to inpatient rehab if he can't manage at home and is willing to set up intake appt for [facility] rehab once we know his DC date.  Pt also sometimes has transportation issues to appts- CSW has assist with this in the past and reiterated to pt that when his discharges we are available to help get him to appts as  needed- pt expressed understanding.  Pt reports his only other barrier might be getting his medications consistently.  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  Pt also inquiring about disability- states he got a rejection notice.  CSW explained that CSW received confirmation from [facility] that an application had been submitted for him on 6/7 and that the rejection could have been for SSDI because pt does not have sufficient work history.  Had pt call his long term partner to see if he had received any mail from [facility] or [employer]- she was able to confirm that he had and she was assisting pt in completing and returning.  CSW encouraged him to let me know if they needed assistance completing these forms.  CSW will continue to follow pt outpatient and assist as needed  [name], [facility] Desk#: [phone] Cell#: [phone]

## 2020-06-25 NOTE — Progress Notes (Addendum)
Advanced Heart Failure Rounding Note  PCP-Cardiologist: Shelva Majestic, MD   Subjective:    Yesterday milrinone was increased to 0.375 mcg.  CO-OX up from 48%-->54%.   Diuresing with IV lasix. Neg 1.6 liters.   Feeling a little better. Denies SOB. Belly discomfort better.    Objective:   Weight Range: 74.7 kg Body mass index is 23.3 kg/m.   Vital Signs:   Temp:  [97.4 F (36.3 C)-98 F (36.7 C)] 97.9 F (36.6 C) (06/29 0325) Pulse Rate:  [63-109] 109 (06/29 0325) Resp:  [18-25] 20 (06/29 0325) BP: (91-108)/(54-86) 91/67 (06/29 0325) SpO2:  [93 %-100 %] 93 % (06/29 0325) Weight:  [74.7 kg] 74.7 kg (06/29 0500) Last BM Date: 06/24/20  Weight change: Filed Weights   06/23/20 0405 06/24/20 0332 06/25/20 0500  Weight: 75.1 kg 75 kg 74.7 kg    Intake/Output:   Intake/Output Summary (Last 24 hours) at 06/25/2020 0704 Last data filed at 06/25/2020 0400 Gross per 24 hour  Intake 1482.1 ml  Output 3175 ml  Net -1692.9 ml      Physical Exam   CVP 11 personally checked.   General:   No resp difficulty HEENT: normal Neck: supple. JVP 11-12  Carotids 2+ bilat; no bruits. No lymphadenopathy or thryomegaly appreciated. Cor: PMI nondisplaced. Regular rate & rhythm. No rubs,  or murmurs. +S3  Lungs: clear Abdomen: soft, nontender, nondistended. No hepatosplenomegaly. No bruits or masses. Good bowel sounds. Extremities: no cyanosis, clubbing, rash, edema Neuro: alert & orientedx3, cranial nerves grossly intact. moves all 4 extremities w/o difficulty. Affect pleasant   Telemetry   SR/ST 90-100s  EKG   n/a  Labs    CBC Recent Labs    06/23/20 0406 06/25/20 0338  WBC 9.5 9.3  HGB 13.5 12.9*  HCT 41.9 39.5  MCV 89.0 88.8  PLT 257 951   Basic Metabolic Panel Recent Labs    06/22/20 1851 06/23/20 0406 06/24/20 0245 06/25/20 0338  NA 134*   < > 137 137  K 3.4*   < > 4.1 4.8  CL 91*   < > 99 101  CO2 32   < > 30 28  GLUCOSE 159*   < > 139* 129*  BUN  35*   < > 33* 30*  CREATININE 1.64*   < > 1.80* 1.55*  CALCIUM 9.0   < > 8.8* 9.3  MG 1.8  --   --   --    < > = values in this interval not displayed.   Liver Function Tests No results for input(s): AST, ALT, ALKPHOS, BILITOT, PROT, ALBUMIN in the last 72 hours. No results for input(s): LIPASE, AMYLASE in the last 72 hours. Cardiac Enzymes No results for input(s): CKTOTAL, CKMB, CKMBINDEX, TROPONINI in the last 72 hours.  BNP: BNP (last 3 results) Recent Labs    04/01/20 0629 04/09/20 1209 06/22/20 0105  BNP 773.7* 1,195.9* 913.0*    ProBNP (last 3 results) Recent Labs    01/30/20 1645  PROBNP 1,759*     D-Dimer No results for input(s): DDIMER in the last 72 hours. Hemoglobin A1C No results for input(s): HGBA1C in the last 72 hours. Fasting Lipid Panel No results for input(s): CHOL, HDL, LDLCALC, TRIG, CHOLHDL, LDLDIRECT in the last 72 hours. Thyroid Function Tests No results for input(s): TSH, T4TOTAL, T3FREE, THYROIDAB in the last 72 hours.  Invalid input(s): FREET3  Other results:   Imaging    No results found.   Medications:  Scheduled Medications: . aspirin EC  81 mg Oral Daily  . atorvastatin  80 mg Oral q1800  . Chlorhexidine Gluconate Cloth  6 each Topical Daily  . digoxin  0.125 mg Oral Daily  . furosemide  80 mg Intravenous BID  . heparin  5,000 Units Subcutaneous Q8H  . multivitamin with minerals  1 tablet Oral Daily  . pantoprazole  40 mg Oral Daily  . potassium chloride  40 mEq Oral BID  . sodium chloride flush  10-40 mL Intracatheter Q12H  . sodium chloride flush  3 mL Intravenous Q12H  . spironolactone  12.5 mg Oral Daily    Infusions: . sodium chloride    . milrinone 0.375 mcg/kg/min (06/25/20 0551)    PRN Medications: sodium chloride, acetaminophen, albuterol, docusate sodium, sodium chloride flush, sodium chloride flush    Patient Profile  Mr Tyler Wong is a 54 year old with a history of CAD, DES to LAD in 2015, HTN,  cocaine, and chronic systolic heart failure due primarily to NICM.  Admitted with A/C Biventricular HF. Started on milrinone.    Assessment/Plan  . Acute on chronic systolic CHF -> cardiogenic shock:  - Echo in 2015 with EF 40-45%, thought to be ischemic cardiomyopathy.  - Ech 4/21 EF down to<20% with mild RV dysfunction. - Cath in 10/19 showed 90% ostial OM1 and 70% PLV, no interventional target. Suspect mixed ischemic/nonischemic cardiomyopathy. 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 - Cocaine likely plays a role in his cardiomyopathy (actively using).  - Failed outpatient management due to AKI and poor compliance - Now with recurrent cardiogenic shock with co-ox 37%. Back on milrinone per primary team.  - CO-OX improved to 54% on milrinone 0.375 mcg.  - CVP 11. Continue IV lasix 80 mg twice a day and given 2.5 mg metolazone.  - Continue dig. - Continue spiro. - Renal function stable.   - BP to soft for low-dose Bidil 2. CAD: H/o anterior STEMI with DES to LAD in 7/15.As above, cath in 10/19 with 90% ostial OM1 and 70% PLV. Medical therapy.  -ContinueASA 81 and atorvastatin 80 mg daily. - No chest pain.  3. Cocaine abuse: Counseled to quit. But continues to use. + UDS on admit - Has had outpatient treatment in the past.  4. Active smoker: Discussed need for cessation 5. Acute on CKD 3a due to cardiorenal/ATN - baseline creatinine ~1.4 - 2.1 on admit now 1.55 - BMET in am.  6. Hypokalemia/hyperkalemia - K stable today.  7. Goals of Care Not a candidate for advanced therapies or home milrinone with active use of cocaine and noncompliance - He has again failed outpatient management. Only option seems to be Hospice.  - He is not interested. Would like to consult Palliative Care however he declined.  8. Social  Tranportation/Substance Abuse.  issues. I will message HFSW team.    Length of Stay:  3  Amy Clegg, NP  06/25/2020, 7:04 AM  Advanced Heart Failure Team Pager [phone] (M-F; 7a - 4p)  Please contact [facility] for night-coverage after hours (4p -7a ) and weekends on [email or web address]   Patient seen and examined with the above-signed Advanced Practice Provider and/or Housestaff. I personally reviewed laboratory data, imaging studies and relevant notes. I independently examined the patient and formulated the important aspects of the plan. I have edited the note to reflect any of my changes or salient points. I have personally discussed the plan with the  patient and/or family.  Remains tenuous but co-ox improving on higher dose of milrinone. Diuresing well. CVP 10-11. Fatigues easily.   General:  Sitting up in bed  No resp difficulty HEENT: normal Neck: supple. JVP 11 Carotids 2+ bilat; no bruits. No lymphadenopathy or thryomegaly appreciated. Cor: PMI nondisplaced. Regular tachy +s3 3/6 MR Lungs: clear Abdomen: soft, nontender, nondistended. No hepatosplenomegaly. No bruits or masses. Good bowel sounds. Extremities: no cyanosis, clubbing, rash, edema Neuro: alert & orientedx3, cranial nerves grossly intact. moves all 4 extremities w/o difficulty. Affect pleasant  Remains very tenuous despite high-dose milrinone. Will continue to diurese. Add low-dose bidil. Continue spiro.   Glori Bickers, MD  1:03 PM

## 2020-06-25 NOTE — Progress Notes (Signed)
CARDIAC REHAB PHASE I   PRE:  Rate/Rhythm: 107 ST  BP:  Supine:   Sitting: 113/82  Standing:    SaO2: 96%RA  MODE:  Ambulation: 1400 ft   POST:  Rate/Rhythm: 121 ST few PVCs  BP:  Supine:   Sitting: 111/87  Standing:    SaO2: 96%RA 1432-1500 Pt requested to walk farther so he could look out door to outside. Walked 1400 ft on RA with steady gait and I managed IV pole and lines. Denied SOB or CP. Tolerated well. Wants to go outside. Discussed with pt he would need MD order to be able to go out and sit. To sitting on side of bed after walk.   Graylon Good, RN BSN  06/25/2020 2:58 PM

## 2020-06-26 LAB — COOXEMETRY PANEL
Carboxyhemoglobin: 1 % (ref 0.5–1.5)
Methemoglobin: 0.9 % (ref 0.0–1.5)
O2 Saturation: 56.4 %
Total hemoglobin: 15.1 g/dL (ref 12.0–16.0)

## 2020-06-26 LAB — BASIC METABOLIC PANEL
Anion gap: 10 (ref 5–15)
BUN: 31 mg/dL — ABNORMAL HIGH (ref 6–20)
CO2: 29 mmol/L (ref 22–32)
Calcium: 9.9 mg/dL (ref 8.9–10.3)
Chloride: 93 mmol/L — ABNORMAL LOW (ref 98–111)
Creatinine, Ser: 1.7 mg/dL — ABNORMAL HIGH (ref 0.61–1.24)
GFR calc Af Amer: 52 mL/min — ABNORMAL LOW (ref >60)
GFR calc non Af Amer: 45 mL/min — ABNORMAL LOW (ref >60)
Glucose, Bld: 153 mg/dL — ABNORMAL HIGH (ref 70–99)
Potassium: 4.8 mmol/L (ref 3.5–5.1)
Sodium: 132 mmol/L — ABNORMAL LOW (ref 135–145)

## 2020-06-26 MED ORDER — ISOSORB DINITRATE-HYDRALAZINE 20-37.5 MG PO TABS
1.0000 | ORAL_TABLET | Freq: Three times a day (TID) | ORAL | Status: DC
  Administered 2020-06-26 – 2020-06-29 (×9): 1 via ORAL
  Filled 2020-06-26 (×9): qty 1

## 2020-06-26 MED ORDER — POLYETHYLENE GLYCOL 3350 17 G PO PACK
17.0000 g | PACK | Freq: Every day | ORAL | Status: DC
  Administered 2020-06-26 – 2020-06-29 (×4): 17 g via ORAL
  Filled 2020-06-26 (×5): qty 1

## 2020-06-26 NOTE — Progress Notes (Addendum)
Advanced Heart Failure Rounding Note  PCP-Cardiologist: Shelva Majestic, MD   Subjective:    Remains on milrinone 0.375 mcg.  CO-OX up from 48%-->54%> 56%.   Yesterday diuresed with IV lasix + metolazone. Brisk diuresis noted. Overall weight down 18 pounds.  CVP 3-4   Complaining of leg cramps. Denies SOB.    Objective:   Weight Range: 71.7 kg Body mass index is 22.35 kg/m.   Vital Signs:   Temp:  [98 F (36.7 C)-98.3 F (36.8 C)] 98.3 F (36.8 C) (06/30 0831) Pulse Rate:  [95-108] 107 (06/30 1127) Resp:  [15-21] 20 (06/30 1127) BP: (93-117)/(65-90) 108/74 (06/30 1127) SpO2:  [96 %-100 %] 99 % (06/30 1127) Weight:  [71.7 kg] 71.7 kg (06/30 0425) Last BM Date: 06/25/20  Weight change: Filed Weights   06/24/20 0332 06/25/20 0500 06/26/20 0425  Weight: 75 kg 74.7 kg 71.7 kg    Intake/Output:   Intake/Output Summary (Last 24 hours) at 06/26/2020 1222 Last data filed at 06/26/2020 1125 Gross per 24 hour  Intake 720 ml  Output 3825 ml  Net -3105 ml      Physical Exam  CVP 4-5  General:  Well appearing. No resp difficulty HEENT: normal Neck: supple. no JVD. Carotids 2+ bilat; no bruits. No lymphadenopathy or thryomegaly appreciated. Cor: PMI nondisplaced. Regular rate & rhythm. No rubs. + 3 3/6 Tyler . Lungs: clear Abdomen: soft, nontender, nondistended. No hepatosplenomegaly. No bruits or masses. Good bowel sounds. Extremities: no cyanosis, clubbing, rash, edema Neuro: alert & orientedx3, cranial nerves grossly intact. moves all 4 extremities w/o difficulty. Affect pleasant    Telemetry   Sr -ST 90-100s   EKG   n/a  Labs    CBC Recent Labs    06/25/20 0338  WBC 9.3  HGB 12.9*  HCT 39.5  MCV 88.8  PLT 607   Basic Metabolic Panel Recent Labs    06/25/20 0338 06/26/20 0440  NA 137 132*  K 4.8 4.8  CL 101 93*  CO2 28 29  GLUCOSE 129* 153*  BUN 30* 31*  CREATININE 1.55* 1.70*  CALCIUM 9.3 9.9   Liver Function Tests No results for  input(s): AST, ALT, ALKPHOS, BILITOT, PROT, ALBUMIN in the last 72 hours. No results for input(s): LIPASE, AMYLASE in the last 72 hours. Cardiac Enzymes No results for input(s): CKTOTAL, CKMB, CKMBINDEX, TROPONINI in the last 72 hours.  BNP: BNP (last 3 results) Recent Labs    04/01/20 0629 04/09/20 1209 06/22/20 0105  BNP 773.7* 1,195.9* 913.0*    ProBNP (last 3 results) Recent Labs    01/30/20 1645  PROBNP 1,759*     D-Dimer No results for input(s): DDIMER in the last 72 hours. Hemoglobin A1C No results for input(s): HGBA1C in the last 72 hours. Fasting Lipid Panel No results for input(s): CHOL, HDL, LDLCALC, TRIG, CHOLHDL, LDLDIRECT in the last 72 hours. Thyroid Function Tests No results for input(s): TSH, T4TOTAL, T3FREE, THYROIDAB in the last 72 hours.  Invalid input(s): FREET3  Other results:   Imaging    No results found.   Medications:     Scheduled Medications: . aspirin EC  81 mg Oral Daily  . atorvastatin  80 mg Oral q1800  . Chlorhexidine Gluconate Cloth  6 each Topical Daily  . digoxin  0.125 mg Oral Daily  . heparin  5,000 Units Subcutaneous Q8H  . isosorbide-hydrALAZINE  1 tablet Oral TID  . multivitamin with minerals  1 tablet Oral Daily  . pantoprazole  40 mg Oral Daily  . polyethylene glycol  17 g Oral Daily  . sodium chloride flush  10-40 mL Intracatheter Q12H  . sodium chloride flush  3 mL Intravenous Q12H  . spironolactone  12.5 mg Oral Daily    Infusions: . sodium chloride    . milrinone 0.25 mcg/kg/min (06/26/20 1125)    PRN Medications: sodium chloride, acetaminophen, albuterol, docusate sodium, sodium chloride flush, sodium chloride flush    Patient Profile  Tyler Wong is a 54 year old with a history of CAD, DES to LAD in 2015, HTN, cocaine, and chronic systolic heart failure due primarily to NICM.  Admitted with A/C Biventricular HF. Started on milrinone.    Assessment/Plan  . Acute on chronic systolic CHF ->  cardiogenic shock:  - Echo in 2015 with EF 40-45%, thought to be ischemic cardiomyopathy.  - Ech 4/21 EF down to<20% with mild RV dysfunction. - Cath in 10/19 showed 90% ostial OM1 and 70% PLV, no interventional target. Suspect mixed ischemic/nonischemic cardiomyopathy. 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 - Cocaine likely plays a role in his cardiomyopathy (actively using).  - Failed outpatient management due to AKI and poor compliance - Admitted with recurrent cardiogenic shock with co-ox 37%. Started on milrinone per primary team.  - CVP down to 4-5. Stop IV lasix. Anticipate starting torsemide 60 mg daily (on torsemide 40 mg )  - CO-OX 56%. Cut back milrinone 0.25 mcg.  - Continue dig. - Continue spiro. - Continue low dose  low-dose Bidil 2. CAD: H/o anterior STEMI with DES to LAD in 7/15.As above, cath in 10/19 with 90% ostial OM1 and 70% PLV. Medical therapy.  -ContinueASA 81 and atorvastatin 80 mg daily. - no chest pain  3. Cocaine abuse: Counseled to quit. But continues to use. + UDS on admit - Has had outpatient treatment in the past.  4. Active smoker: Discussed need for cessation 5. Acute on CKD 3a due to cardiorenal/ATN - baseline creatinine ~1.4 - 2.1 on admit now 1.7  - BMET in am.  6. Hypokalemia/hyperkalemia - K stable today.  7. Goals of Care Not a candidate for advanced therapies or home milrinone with active use of cocaine and noncompliance - He has again failed outpatient management. Only option seems to be Hospice.  - He is not interested. Would like to consult Palliative Care however he declined.  8. Social  Tranportation/Substance Abuse.  HFSW consulted.   Continue to wean milrinone over the next few days. Ambulate.   Length of Stay: [place], NP  06/26/2020, 12:22 PM  Patient seen and examined with the above-signed Advanced Practice Provider and/or Housestaff. I personally  reviewed laboratory data, imaging studies and relevant notes. I independently examined the patient and formulated the important aspects of the plan. I have edited the note to reflect any of my changes or salient points. I have personally discussed the plan with the patient and/or family.  Remains on milrinone 0.375. Co-ox 56%. Feeling better but having severe cramps. No SOB, orthopnea or PND. CVP 3-4. Creatinine up slightly 1.55-> 1.70.   General:  Lying in bed. No resp difficulty HEENT: normal Neck: supple. no JVD. Carotids 2+ bilat; no bruits. No lymphadenopathy or thryomegaly appreciated. Cor: PMI nondisplaced. Regular rate & rhythm. +s3. 2/6 Tyler Lungs: clear Abdomen: soft, nontender, nondistended. No hepatosplenomegaly. No bruits or masses. Good bowel sounds. Extremities: no cyanosis, clubbing, rash, edema Neuro: alert & orientedx3, cranial  nerves grossly intact. moves all 4 extremities w/o difficulty. Affect pleasant  Co-ox improved by still marginal on milrinone 0.375. He is now full diuresed. Stop IV lasix. Increase Bidil. Continue slow milrinone wean. If co-ox drops he understands we have no other options for advanced therapies at this point until he can prove abstinence from substance abuse and compliance with f/u.   Glori Bickers, MD  9:25 PM

## 2020-06-26 NOTE — Plan of Care (Signed)

## 2020-06-26 NOTE — Plan of Care (Signed)
  Problem: Education: Goal: Knowledge of General Education information will improve Description: Including pain rating scale, medication(s)/side effects and non-pharmacologic comfort measures Outcome: Progressing   Problem: Clinical Measurements: Goal: Will remain free from infection Outcome: Progressing Goal: Diagnostic test results will improve Outcome: Progressing Goal: Cardiovascular complication will be avoided Outcome: Progressing   Problem: Activity: Goal: Risk for activity intolerance will decrease Outcome: Progressing   Problem: Nutrition: Goal: Adequate nutrition will be maintained Outcome: Progressing   Problem: Coping: Goal: Level of anxiety will decrease Outcome: Progressing

## 2020-06-27 LAB — BASIC METABOLIC PANEL
Anion gap: 8 (ref 5–15)
BUN: 35 mg/dL — ABNORMAL HIGH (ref 6–20)
CO2: 27 mmol/L (ref 22–32)
Calcium: 9.5 mg/dL (ref 8.9–10.3)
Chloride: 99 mmol/L (ref 98–111)
Creatinine, Ser: 1.89 mg/dL — ABNORMAL HIGH (ref 0.61–1.24)
GFR calc Af Amer: 46 mL/min — ABNORMAL LOW (ref >60)
GFR calc non Af Amer: 39 mL/min — ABNORMAL LOW (ref >60)
Glucose, Bld: 135 mg/dL — ABNORMAL HIGH (ref 70–99)
Potassium: 5.2 mmol/L — ABNORMAL HIGH (ref 3.5–5.1)
Sodium: 134 mmol/L — ABNORMAL LOW (ref 135–145)

## 2020-06-27 LAB — COOXEMETRY PANEL
Carboxyhemoglobin: 1.4 % (ref 0.5–1.5)
Methemoglobin: 1.4 % (ref 0.0–1.5)
O2 Saturation: 73.3 %
Total hemoglobin: 14.3 g/dL (ref 12.0–16.0)

## 2020-06-27 NOTE — Progress Notes (Signed)
CARDIAC REHAB PHASE I   PRE:  Rate/Rhythm: 102-110 ST  BP:  Supine:   Sitting: 116/83  Standing:    SaO2: 98%RA  MODE:  Ambulation: 1200 ft   POST:  Rate/Rhythm: 113 ST  BP:  Supine:   Sitting: 120/88  Standing:    SaO2: 99%RA 1451-1520 Pt walked 1200 ft independently with steady gait and tolerated well. Reinforced daily weights and 2000 mg sodium restriction. Showed pt how to read salad dressing packet to see how much sodium was in it. Pt surprised. Stated he has CHF booklet at home. Knew to call MD with 3 lb weight gain overnight.   Graylon Good, RN BSN  06/27/2020 3:17 PM

## 2020-06-27 NOTE — Progress Notes (Addendum)
Advanced Heart Failure Rounding Note  PCP-Cardiologist: Shelva Majestic, MD   Subjective:    Yesterday milrinone cut back to 0.25 mcg. IV diuretics stopped. Co-ox 73%  Feeling ok. Denies SOB.  Able to walk around the unit.      Objective:   Weight Range: 71.6 kg Body mass index is 22.33 kg/m.   Vital Signs:   Temp:  [97.8 F (36.6 C)-98.3 F (36.8 C)] 97.9 F (36.6 C) (07/01 0802) Pulse Rate:  [90-115] 94 (07/01 0802) Resp:  [18-20] 20 (07/01 0802) BP: (100-138)/(66-98) 107/80 (07/01 0802) SpO2:  [95 %-100 %] 97 % (07/01 0802) Weight:  [71.6 kg] 71.6 kg (07/01 0401) Last BM Date: 06/26/20  Weight change: Filed Weights   06/25/20 0500 06/26/20 0425 06/27/20 0401  Weight: 74.7 kg 71.7 kg 71.6 kg    Intake/Output:   Intake/Output Summary (Last 24 hours) at 06/27/2020 0808 Last data filed at 06/27/2020 0802 Gross per 24 hour  Intake 1185.27 ml  Output 2825 ml  Net -1639.73 ml      Physical Exam  CVP 2-3  General:  No resp difficulty HEENT: normal Neck: supple. no JVD. Carotids 2+ bilat; no bruits. No lymphadenopathy or thryomegaly appreciated. Cor: PMI nondisplaced. Regular rate & rhythm. No rubs,  + S3  3/6 MR  Lungs: clear Abdomen: soft, nontender, nondistended. No hepatosplenomegaly. No bruits or masses. Good bowel sounds. Extremities: no cyanosis, clubbing, rash, edema. RUE PICC  Neuro: alert & orientedx3, cranial nerves grossly intact. moves all 4 extremities w/o difficulty. Affect pleasant    Telemetry  SR 100-110s   EKG   n/a  Labs    CBC Recent Labs    06/25/20 0338  WBC 9.3  HGB 12.9*  HCT 39.5  MCV 88.8  PLT 720   Basic Metabolic Panel Recent Labs    06/26/20 0440 06/27/20 0515  NA 132* 134*  K 4.8 5.2*  CL 93* 99  CO2 29 27  GLUCOSE 153* 135*  BUN 31* 35*  CREATININE 1.70* 1.89*  CALCIUM 9.9 9.5   Liver Function Tests No results for input(s): AST, ALT, ALKPHOS, BILITOT, PROT, ALBUMIN in the last 72 hours. No results  for input(s): LIPASE, AMYLASE in the last 72 hours. Cardiac Enzymes No results for input(s): CKTOTAL, CKMB, CKMBINDEX, TROPONINI in the last 72 hours.  BNP: BNP (last 3 results) Recent Labs    04/01/20 0629 04/09/20 1209 06/22/20 0105  BNP 773.7* 1,195.9* 913.0*    ProBNP (last 3 results) Recent Labs    01/30/20 1645  PROBNP 1,759*     D-Dimer No results for input(s): DDIMER in the last 72 hours. Hemoglobin A1C No results for input(s): HGBA1C in the last 72 hours. Fasting Lipid Panel No results for input(s): CHOL, HDL, LDLCALC, TRIG, CHOLHDL, LDLDIRECT in the last 72 hours. Thyroid Function Tests No results for input(s): TSH, T4TOTAL, T3FREE, THYROIDAB in the last 72 hours.  Invalid input(s): FREET3  Other results:   Imaging    No results found.   Medications:     Scheduled Medications:  aspirin EC  81 mg Oral Daily   atorvastatin  80 mg Oral q1800   Chlorhexidine Gluconate Cloth  6 each Topical Daily   digoxin  0.125 mg Oral Daily   heparin  5,000 Units Subcutaneous Q8H   isosorbide-hydrALAZINE  1 tablet Oral TID   multivitamin with minerals  1 tablet Oral Daily   pantoprazole  40 mg Oral Daily   polyethylene glycol  17 g Oral  Daily   sodium chloride flush  10-40 mL Intracatheter Q12H   sodium chloride flush  3 mL Intravenous Q12H   spironolactone  12.5 mg Oral Daily    Infusions:  sodium chloride     milrinone 0.25 mcg/kg/min (06/27/20 0400)    PRN Medications: sodium chloride, acetaminophen, albuterol, docusate sodium, sodium chloride flush, sodium chloride flush    Patient Profile  Mr Barner is a 54 year old with a history of CAD, DES to LAD in 2015, HTN, cocaine, and chronic systolic heart failure due primarily to NICM.  Admitted with A/C Biventricular HF. Started on milrinone.    Assessment/Plan  . Acute on chronic systolic CHF -> cardiogenic shock:  - Echo in 2015 with EF 40-45%, thought to be ischemic  cardiomyopathy.  - Ech 4/21 EF down to<20% with mild RV dysfunction. - Cath in 10/19 showed 90% ostial OM1 and 70% PLV, no interventional target. Suspect mixed ischemic/nonischemic cardiomyopathy. 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 - Cocaine likely plays a role in his cardiomyopathy (actively using).  - Failed outpatient management due to AKI and poor compliance - Admitted with recurrent cardiogenic shock with co-ox 37%. Started on milrinone per primary team.  - CO-OX stable. Cut back milrinone to 0.125 mcg.  - CVP low 2  - Creatinine trending up. Hold diuretics today. Stop spiro.  - Continue dig. - Continue  Bidil 2. CAD: H/o anterior STEMI with DES to LAD in 7/15.As above, cath in 10/19 with 90% ostial OM1 and 70% PLV. Medical therapy.  -ContinueASA 81 and atorvastatin 80 mg daily. - No chest pain.  3. Cocaine abuse: Counseled to quit. But continues to use. + UDS on admit - Has had outpatient treatment in the past.  4. Active smoker: Discussed need for cessation 5. Acute on CKD 3a due to cardiorenal/ATN - baseline creatinine ~1.4 - 2.1 on admit now 1.9   - BMET in am. Hold diuretics and spiro.  6. Hypokalemia/hyperkalemia - K running higher today. Hold spiro.  - Repeat BMET in am. .  7. Goals of Care Not a candidate for advanced therapies or home milrinone with active use of cocaine and noncompliance - He has again failed outpatient management. Only option seems to be Hospice.  - He is not interested. Would like to consult Palliative Care however he declined.  8. Social  Tranportation/Substance Abuse.  HFSW consulted.    Length of Stay: [place], NP  06/27/2020, 8:08 AM  Patient seen and examined with the above-signed Advanced Practice Provider and/or Housestaff. I personally reviewed laboratory data, imaging studies and relevant notes. I independently examined the patient and formulated the  important aspects of the plan. I have edited the note to reflect any of my changes or salient points. I have personally discussed the plan with the patient and/or family.  Remains on milrinone 0.25. Co-ox stable at 73%. CVP low. Creatinine climbing.   General:  Sitting up in bed. No resp difficulty HEENT: normal Neck: supple. no JVD. Carotids 2+ bilat; no bruits. No lymphadenopathy or thryomegaly appreciated. Cor: PMI nondisplaced. Regular rate & rhythm. +s3 2/6 MR Lungs: clear Abdomen: soft, nontender, nondistended. No hepatosplenomegaly. No bruits or masses. Good bowel sounds. Extremities: no cyanosis, clubbing, rash, edema Neuro: alert & orientedx3, cranial nerves grossly intact. moves all 4 extremities w/o difficulty. Affect pleasant   Drop milrinone to 0.125. Hold diuretics. Follow co-ox and renal function closely. Remains tenuous. Not sure  he will fly off inotropes.   Glori Bickers, MD  6:34 PM

## 2020-06-27 NOTE — Plan of Care (Signed)

## 2020-06-27 NOTE — Progress Notes (Signed)
Outpatient CSW informed that pt might be stable to leave over the weekend so met with pt to revisit rehab discussion.  Pt is agreeable to trying to set up appt with [facility] inpatient rehab in case he is struggling to remain clean at home.  CSW called [facility] who reports next available intake appt is next Friday but due to pt medical concerns they would need further information about his condition faxed for review.  Pt agreeable to this so CSW faxed referral for review- they will reach out to CSW after they reviewed to set up initial appt if they feel they can manage pt needs.  CSW also revisited pt joining paramedicine.  Pt states he does think he would benefit from this again so CSW placed referral for [facility] to pick pt back up.  CSW will continue to follow and assist as needed in outpatient setting  [name], [facility] Desk#: [phone] Cell#: [phone]

## 2020-06-28 ENCOUNTER — Encounter (HOSPITAL_COMMUNITY): Payer: Self-pay | Admitting: Cardiology

## 2020-06-28 LAB — BASIC METABOLIC PANEL
Anion gap: 8 (ref 5–15)
BUN: 30 mg/dL — ABNORMAL HIGH (ref 6–20)
CO2: 26 mmol/L (ref 22–32)
Calcium: 9.6 mg/dL (ref 8.9–10.3)
Chloride: 99 mmol/L (ref 98–111)
Creatinine, Ser: 1.42 mg/dL — ABNORMAL HIGH (ref 0.61–1.24)
GFR calc Af Amer: >60 mL/min (ref >60)
GFR calc non Af Amer: 56 mL/min — ABNORMAL LOW (ref >60)
Glucose, Bld: 133 mg/dL — ABNORMAL HIGH (ref 70–99)
Potassium: 5 mmol/L (ref 3.5–5.1)
Sodium: 133 mmol/L — ABNORMAL LOW (ref 135–145)

## 2020-06-28 LAB — DIGOXIN LEVEL: Digoxin Level: 0.5 ng/mL — ABNORMAL LOW (ref 0.8–2.0)

## 2020-06-28 LAB — COOXEMETRY PANEL
Carboxyhemoglobin: 1.4 % (ref 0.5–1.5)
Methemoglobin: 1.4 % (ref 0.0–1.5)
O2 Saturation: 70 %
Total hemoglobin: 12.5 g/dL (ref 12.0–16.0)

## 2020-06-28 NOTE — Plan of Care (Signed)

## 2020-06-28 NOTE — Progress Notes (Signed)
CARDIAC REHAB PHASE I   PRE:  Rate/Rhythm: 110 ST  BP:  Supine:   Sitting: 110/86  Standing:    SaO2: 98%RA  MODE:  Ambulation: 600 ft   POST:  Rate/Rhythm: 112 ST  BP:  Supine:   Sitting: 109/84  Standing:    SaO2: 99%RA 1110-1132 Pt stated he has walked twice already. Walked 600 ft on RA with steady gait. Foot bothering him a little again today. Seems to come and go. Since pt has no questions re ed done and he is walking independently, we will sign off.    Graylon Good, RN BSN  06/28/2020 11:29 AM

## 2020-06-28 NOTE — Progress Notes (Addendum)
Advanced Heart Failure Rounding Note  PCP-Cardiologist: Shelva Majestic, MD   Subjective:    Co-ox stable w/ milrinone wean. 70% today at 0.125 mcg.   Scr improving w/ diuretic hold, 1.89>>1.42 today. K improved but still upper limits of normal at 5.0 (5.2 yesterday).   Na 133.   -2.5L in UOP yesterday. CVP 7.   BP controlled on current regimen.     Objective:   Weight Range: 72.7 kg Body mass index is 22.67 kg/m.   Vital Signs:   Temp:  [97.4 F (36.3 C)-98.1 F (36.7 C)] 97.4 F (36.3 C) (07/02 0732) Pulse Rate:  [100-108] 100 (07/02 0732) Resp:  [19-20] 19 (07/02 0732) BP: (92-148)/(68-89) 111/74 (07/02 0732) SpO2:  [94 %-100 %] 99 % (07/02 0732) Weight:  [72.7 kg] 72.7 kg (07/02 0420) Last BM Date: 06/27/20  Weight change: Filed Weights   06/26/20 0425 06/27/20 0401 06/28/20 0420  Weight: 71.7 kg 71.6 kg 72.7 kg    Intake/Output:   Intake/Output Summary (Last 24 hours) at 06/28/2020 0954 Last data filed at 06/28/2020 0425 Gross per 24 hour  Intake 675.51 ml  Output 1925 ml  Net -1249.49 ml      Physical Exam  CVP 7  General:  Well appearing. Sitting up in bed. No resp difficulty HEENT: normal Neck: supple. no JVD. Carotids 2+ bilat; no bruits. No lymphadenopathy or thryomegaly appreciated. Cor: PMI nondisplaced. Regular rate & rhythm. No rubs, 3/6 HSM at apex  Lungs: clear, no wheezing  Abdomen: soft, nontender, nondistended. No hepatosplenomegaly. No bruits or masses. Good bowel sounds. Extremities: no cyanosis, clubbing, rash, edema. + RUE PICC  Neuro: alert & orientedx3, cranial nerves grossly intact. moves all 4 extremities w/o difficulty. Affect pleasant    Telemetry   Sinus tach, low 100s   EKG   n/a  Labs    CBC No results for input(s): WBC, NEUTROABS, HGB, HCT, MCV, PLT in the last 72 hours. Basic Metabolic Panel Recent Labs    06/27/20 0515 06/28/20 0340  NA 134* 133*  K 5.2* 5.0  CL 99 99  CO2 27 26  GLUCOSE 135* 133*    BUN 35* 30*  CREATININE 1.89* 1.42*  CALCIUM 9.5 9.6   Liver Function Tests No results for input(s): AST, ALT, ALKPHOS, BILITOT, PROT, ALBUMIN in the last 72 hours. No results for input(s): LIPASE, AMYLASE in the last 72 hours. Cardiac Enzymes No results for input(s): CKTOTAL, CKMB, CKMBINDEX, TROPONINI in the last 72 hours.  BNP: BNP (last 3 results) Recent Labs    04/01/20 0629 04/09/20 1209 06/22/20 0105  BNP 773.7* 1,195.9* 913.0*    ProBNP (last 3 results) Recent Labs    01/30/20 1645  PROBNP 1,759*     D-Dimer No results for input(s): DDIMER in the last 72 hours. Hemoglobin A1C No results for input(s): HGBA1C in the last 72 hours. Fasting Lipid Panel No results for input(s): CHOL, HDL, LDLCALC, TRIG, CHOLHDL, LDLDIRECT in the last 72 hours. Thyroid Function Tests No results for input(s): TSH, T4TOTAL, T3FREE, THYROIDAB in the last 72 hours.  Invalid input(s): FREET3  Other results:   Imaging    No results found.   Medications:     Scheduled Medications: . aspirin EC  81 mg Oral Daily  . atorvastatin  80 mg Oral q1800  . Chlorhexidine Gluconate Cloth  6 each Topical Daily  . digoxin  0.125 mg Oral Daily  . heparin  5,000 Units Subcutaneous Q8H  . isosorbide-hydrALAZINE  1 tablet  Oral TID  . multivitamin with minerals  1 tablet Oral Daily  . pantoprazole  40 mg Oral Daily  . polyethylene glycol  17 g Oral Daily  . sodium chloride flush  10-40 mL Intracatheter Q12H  . sodium chloride flush  3 mL Intravenous Q12H    Infusions: . sodium chloride    . milrinone 0.125 mcg/kg/min (06/28/20 0425)    PRN Medications: sodium chloride, acetaminophen, albuterol, docusate sodium, sodium chloride flush, sodium chloride flush    Patient Profile  Mr Viveros is a 55 year old with a history of CAD, DES to LAD in 2015, HTN, cocaine, and chronic systolic heart failure due primarily to NICM.  Admitted with A/C Biventricular HF. Started on milrinone.     Assessment/Plan   1. Acute on chronic systolic CHF -> cardiogenic shock:  - Echo in 2015 with EF 40-45%, thought to be ischemic cardiomyopathy.  - Ech 4/21 EF down to<20% with mild RV dysfunction. - Cath in 10/19 showed 90% ostial OM1 and 70% PLV, no interventional target. Suspect mixed ischemic/nonischemic cardiomyopathy. 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 - Cocaine likely plays a role in his cardiomyopathy (actively using).  - Failed outpatient management due to AKI and poor compliance - Admitted with recurrent cardiogenic shock with co-ox 37%. Started on milrinone per primary team.  - now weaning down milrinone, currently at 0.125 - CO-OX stable at 70%. Stop milrinone today and follow co-ox  - Volume status good. CVP 7  - Creatinine improving, 1.8>>1.4. Continue to hold PO diuretics today. Plan to resume PO torsemide 40 mg daily tomorrow  - off spiro w/ hyperkalemia  - Continue dig. Dig level ok today 0.5  - Continue  Bidil 1 tab tid  2. CAD: H/o anterior STEMI with DES to LAD in 7/15.As above, cath in 10/19 with 90% ostial OM1 and 70% PLV. Medical therapy.  -ContinueASA 81 and atorvastatin 80 mg daily. - No chest pain.  3. Cocaine abuse: Counseled to quit. But continues to use. + UDS on admit - Has had outpatient treatment in the past.  4. Active smoker: Discussed need for cessation 5. Acute on CKD 3a due to cardiorenal/ATN - SCr bumped to 2.1 this admit - baseline creatinine ~1.4 - improved today, back to 1.4. continue to follow  6. Hypokalemia/hyperkalemia - K 5.2 yesterday. Arlyce Harman discontinued - 5.0 today. Continue to monitor  7. Goals of Care - Not a candidate for advanced therapies or home milrinone with active use of cocaine and noncompliance - He has again failed outpatient management. Only option seems to be Hospice.  - He is not interested. Would like to consult Palliative Care  however he declined. - he is agreeable to restart paramedicine to help w/ compliance. SW assisting   8. Social  Tranportation/Substance Abuse.  HFSW consulted.    Length of Stay: [address] [place]  06/28/2020, 9:54 AM  Patient seen and examined with the above-signed Advanced Practice Provider and/or Housestaff. I personally reviewed laboratory data, imaging studies and relevant notes. I independently examined the patient and formulated the important aspects of the plan. I have edited the note to reflect any of my changes or salient points. I have personally discussed the plan with the patient and/or family.  Coox stable on low-dose milrinone. Creatinine improved with stopping iv lasix. Tolerating Bidil.   General:  Sitting up in bed.  No resp difficulty HEENT: normal Neck: supple. no JVD. Carotids  2+ bilat; no bruits. No lymphadenopathy or thryomegaly appreciated. Cor: PMI nondisplaced. Regular rate & rhythm.+ MR Lungs: clear Abdomen: soft, nontender, nondistended. No hepatosplenomegaly. No bruits or masses. Good bowel sounds. Extremities: no cyanosis, clubbing, rash, edema Neuro: alert & orientedx3, cranial nerves grossly intact. moves all 4 extremities w/o difficulty. Affect pleasant  Tolerating milrinone wean well. Will stop milrinone today. Watch co-ox and renal function very closely. Titrate GDMT as able.   Glori Bickers, MD  4:39 PM

## 2020-06-29 DIAGNOSIS — I1310 Hypertensive heart and chronic kidney disease without heart failure, with stage 1 through stage 4 chronic kidney disease, or unspecified chronic kidney disease: Secondary | ICD-10-CM

## 2020-06-29 DIAGNOSIS — I255 Ischemic cardiomyopathy: Secondary | ICD-10-CM

## 2020-06-29 LAB — COOXEMETRY PANEL
Carboxyhemoglobin: 1 % (ref 0.5–1.5)
Methemoglobin: 0.9 % (ref 0.0–1.5)
O2 Saturation: 69.3 %
Total hemoglobin: 14.1 g/dL (ref 12.0–16.0)

## 2020-06-29 LAB — BASIC METABOLIC PANEL
Anion gap: 8 (ref 5–15)
BUN: 27 mg/dL — ABNORMAL HIGH (ref 6–20)
CO2: 25 mmol/L (ref 22–32)
Calcium: 9.2 mg/dL (ref 8.9–10.3)
Chloride: 98 mmol/L (ref 98–111)
Creatinine, Ser: 1.4 mg/dL — ABNORMAL HIGH (ref 0.61–1.24)
GFR calc Af Amer: >60 mL/min (ref >60)
GFR calc non Af Amer: 57 mL/min — ABNORMAL LOW (ref >60)
Glucose, Bld: 125 mg/dL — ABNORMAL HIGH (ref 70–99)
Potassium: 4.5 mmol/L (ref 3.5–5.1)
Sodium: 131 mmol/L — ABNORMAL LOW (ref 135–145)

## 2020-06-29 MED ORDER — DIGOXIN 125 MCG PO TABS: 0.1250 mg | ORAL_TABLET | Freq: Every day | ORAL | 6 refills | Status: DC

## 2020-06-29 MED ORDER — ISOSORB DINITRATE-HYDRALAZINE 20-37.5 MG PO TABS: 1.0000 | ORAL_TABLET | Freq: Three times a day (TID) | ORAL | 6 refills | Status: DC

## 2020-06-29 NOTE — Progress Notes (Signed)
Patient's HR went up to 129. Patient had walked down the hall couple of times @0500 . HR was 110-115 while walking.  This RN assessed the patient. Patient asymptomatic, sitting at the edge of the bed. Patient has been ST throughout the admission. On call MD paged. Waiting for orders. Will continue to monitor the patient.

## 2020-06-29 NOTE — Progress Notes (Signed)
Advanced Heart Failure Rounding Note  PCP-Cardiologist: Shelva Majestic, MD   Subjective:    Feels good. No CP or SOB. Milrinone stopped yesterday.   Co-ox stable at 69%  Creatinine stable    Objective:   Weight Range: 74.7 kg Body mass index is 23.3 kg/m.   Vital Signs:   Temp:  [97.4 F (36.3 C)-98.4 F (36.9 C)] 98 F (36.7 C) (07/03 0359) Pulse Rate:  [97-103] 100 (07/03 0359) Resp:  [19-20] 20 (07/03 0359) BP: (98-118)/(68-75) 98/71 (07/03 0359) SpO2:  [98 %-99 %] 99 % (07/03 0359) Weight:  [74.7 kg] 74.7 kg (07/03 0444) Last BM Date: 06/28/20  Weight change: Filed Weights   06/27/20 0401 06/28/20 0420 06/29/20 0444  Weight: 71.6 kg 72.7 kg 74.7 kg    Intake/Output:   Intake/Output Summary (Last 24 hours) at 06/29/2020 0551 Last data filed at 06/29/2020 0400 Gross per 24 hour  Intake 1108.9 ml  Output 1075 ml  Net 33.9 ml      Physical Exam   General:  Well appearing. No resp difficulty HEENT: normal Neck: supple. no JVD. Carotids 2+ bilat; no bruits. No lymphadenopathy or thryomegaly appreciated. Cor: PMI nondisplaced. Regular rate & rhythm. + MR Lungs: clear Abdomen: soft, nontender, nondistended. No hepatosplenomegaly. No bruits or masses. Good bowel sounds. Extremities: no cyanosis, clubbing, rash, edema Neuro: alert & orientedx3, cranial nerves grossly intact. moves all 4 extremities w/o difficulty. Affect pleasant    Telemetry   Sinus 95-105 Personally reviewed  EKG   n/a  Labs    CBC No results for input(s): WBC, NEUTROABS, HGB, HCT, MCV, PLT in the last 72 hours. Basic Metabolic Panel Recent Labs    06/28/20 0340 06/29/20 0350  NA 133* 131*  K 5.0 4.5  CL 99 98  CO2 26 25  GLUCOSE 133* 125*  BUN 30* 27*  CREATININE 1.42* 1.40*  CALCIUM 9.6 9.2   Liver Function Tests No results for input(s): AST, ALT, ALKPHOS, BILITOT, PROT, ALBUMIN in the last 72 hours. No results for input(s): LIPASE, AMYLASE in the last 72  hours. Cardiac Enzymes No results for input(s): CKTOTAL, CKMB, CKMBINDEX, TROPONINI in the last 72 hours.  BNP: BNP (last 3 results) Recent Labs    04/01/20 0629 04/09/20 1209 06/22/20 0105  BNP 773.7* 1,195.9* 913.0*    ProBNP (last 3 results) Recent Labs    01/30/20 1645  PROBNP 1,759*     D-Dimer No results for input(s): DDIMER in the last 72 hours. Hemoglobin A1C No results for input(s): HGBA1C in the last 72 hours. Fasting Lipid Panel No results for input(s): CHOL, HDL, LDLCALC, TRIG, CHOLHDL, LDLDIRECT in the last 72 hours. Thyroid Function Tests No results for input(s): TSH, T4TOTAL, T3FREE, THYROIDAB in the last 72 hours.  Invalid input(s): FREET3  Other results:   Imaging    No results found.   Medications:     Scheduled Medications: . aspirin EC  81 mg Oral Daily  . atorvastatin  80 mg Oral q1800  . Chlorhexidine Gluconate Cloth  6 each Topical Daily  . digoxin  0.125 mg Oral Daily  . heparin  5,000 Units Subcutaneous Q8H  . isosorbide-hydrALAZINE  1 tablet Oral TID  . multivitamin with minerals  1 tablet Oral Daily  . pantoprazole  40 mg Oral Daily  . polyethylene glycol  17 g Oral Daily  . sodium chloride flush  10-40 mL Intracatheter Q12H  . sodium chloride flush  3 mL Intravenous Q12H    Infusions: .  sodium chloride      PRN Medications: sodium chloride, acetaminophen, albuterol, docusate sodium, sodium chloride flush, sodium chloride flush    Patient Profile  Mr Shugart is a 54 year old with a history of CAD, DES to LAD in 2015, HTN, cocaine, and chronic systolic heart failure due primarily to NICM.  Admitted with A/C Biventricular HF. Started on milrinone.    Assessment/Plan   1. Acute on chronic systolic CHF -> cardiogenic shock:  - Echo in 2015 with EF 40-45%, thought to be ischemic cardiomyopathy.  - Ech 4/21 EF down to<20% with mild RV dysfunction. - Cath in 10/19 showed 90% ostial OM1 and 70% PLV, no  interventional target. Suspect mixed ischemic/nonischemic cardiomyopathy. 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 - Cocaine likely plays a role in his cardiomyopathy (actively using).  - Failed outpatient management due to AKI and poor compliance - Admitted with recurrent cardiogenic shock with co-ox 37%. Started on milrinone per primary team.  - Milrinone stopped 7/2 Co-ox stable at 69% - CO-OX stable at 70%. Stop milrinone today and follow co-ox  - Volume status good. - Creatinine improved, 1.8>>1.4 - off spiro w/ hyperkalemia  - Continue dig. Dig level ok today 0.5  - Continue  Bidil 1 tab tid  - Start torsemdide 40 daily 2. CAD: H/o anterior STEMI with DES to LAD in 7/15.As above, cath in 10/19 with 90% ostial OM1 and 70% PLV. Medical therapy.  -ContinueASA 81 and atorvastatin 80 mg daily. - No chest pain.  3. Cocaine abuse: Counseled to quit. But continues to use. + UDS on admit - Has had outpatient treatment in the past.  4. Active smoker: Discussed need for cessation 5. Acute on CKD 3a due to cardiorenal/ATN - SCr bumped to 2.1 this admit - baseline creatinine ~1.4 - improved now back to baeline  6. Hypokalemia/hyperkalemia - K 5.2 yesterday. Arlyce Harman discontinued - 4.5 today. Continue to monitor  7. Goals of Care - Not a candidate for advanced therapies or home milrinone with active use of cocaine and noncompliance - He has again failed outpatient management. Only option seems to be Hospice.  - He is not interested. Would like to consult Palliative Care however he declined. - he is agreeable to restart paramedicine to help w/ compliance. SW assisting   8. Social  Tranportation/Substance Abuse.  HFSW consulted.   [place] for home today on  Bidil 1 tab tid (new)  Digoxin 0.125 daily (new) Torsemide 40 daily  ECASA 81 daily Atorva 80 daily  F/u in [facility]  Length of Stay: 7  Glori Bickers, MD   06/29/2020, 5:51 AM

## 2020-06-29 NOTE — Progress Notes (Signed)
Dr. Haroldine Laws at bedside. Discussed about patient's increased HR. Patient had been up walking in the room. MD signed off for discharge. PICC line removed by MD at bedside. Patient getting discharged today.

## 2020-06-29 NOTE — Discharge Instructions (Signed)
***  PLEASE REMEMBER TO BRING ALL OF YOUR MEDICATIONS TO EACH OF YOUR FOLLOW-UP OFFICE VISITS.  

## 2020-06-29 NOTE — Discharge Summary (Addendum)
Discharge Summary    Patient ID: Tyler Wong MRN: [id number]; DOB: [birth year]  Admit date: 06/21/2020 Discharge date: 06/29/2020  Primary Care Provider: [name], NP  Primary Cardiologist: Shelva Majestic, MD / D. Vail Vuncannon, MD - CHF Primary Electrophysiologist:  None   Discharge Diagnoses    Principal Problem:   Acute on chronic clinical systolic heart failure (HCC)  **Minus 10.3L this admission.  **Discharge weight 74.7Kg.  Active Problems:   Cardiorenal syndrome  **Discharge creat 1.40.   Mixed Ischemic & Nonischemic Cardiomyopathy   Cocaine use   CAD S/P percutaneous coronary angioplasty   HTN (hypertension)   CKD (chronic kidney disease) stage 3, GFR 30-59 ml/min   Tobacco abuse   Hyperlipidemia   Current non-adherence to medical treatment  Diagnostic Studies/Procedures    None _____________   History of Present Illness     Tyler Wong is a 54 y.o. male with a h/o CAD s/p prior DES  LAD in 2015, mixed ICM/NICM and HFrEF w/ an EF of <20% by echo 03/2020, HTN, HL, ongoing tobacco & cocaine abuse, CKD III, and medication/lifestyle non-adherence.  He was most recently admitted in 03/2020 due to low output CHF and volume overload.  As noted, EF <20 % by echo and course was complicated by hypotension.  He required milrinone and aggressive diuresis and was eventually transitioned to oral torsemide.  He was discharged but found to have worsening renal function (Creat 2) and hyperkalemia (K 5.6) at CHF clinic follow-up, resulting in discontinuation of digoxin, spironolactone, and losartan.  Following this, he was lost to follow-up.  Unfortunately, he began to experience progressive dyspnea, chest pressure, abdominal fullness, and wt gain.  He presented back to the ED on 6/25 where he was found to be in sinus tachycardia w/ a lactic acid of 2.6.  UDS + for cocaine again.  PICC was placed and he was found to be low-ouput with an initial co-ox of 37%.   Hospital Course      Consultants: [facility] Failure [facility]   He was placed on milrinone following admission with slow but steady improvement in co-ox and volume status.  Spironolactone and digoxin were added back to his regimen however, spironolactone was subsequently discontinued secondary to rising potassium (max 5.2 on 7/1). Once BP allowed, bidil was added.  Though creatinine reached a high of 1.89, with inotropic support (titration of milrinone to 0.375 mcg/kg/min) and diuresis, creatinine stabilized.  On 6/29, he received one dose of metolazone in addition to IV lasix and he was noted to have brisk diuresis with normalization of CVP to 3-4 by 6/30.  Following this, milrinone was weaned and subsequently discontinued on 7/2.  Co-ox remained improved and stable following milrinone discontinuation, and transition of diuretics from IV to PO.  Co-ox this AM is 69%. For this admission, Tyler Wong is net negative 9.8 liters with reduction in weight from a peak to 79.8kg on admission to a low of 71.6kg on 7/1.  Wt on discharge is 74.7kg.  Creat this AM is 1.4.  Pt has been counseled on the importance of medication compliance, symptom reporting, outpatient follow-up, and complete tobacco and cocaine cessation.  He is not a candidate for advanced therapies or home milrinone due to active cocaine use and noncompliance.  He was offered palliative care consultation but declined.  He is willing to resume paramedicine and clinical social work has assisted in arranging.  He will be discharged home today in stable condition.  We will arrange for  outpatient [facility] follow-up within the next week.  Did the patient have an acute coronary syndrome (MI, NSTEMI, STEMI, etc) this admission?:  No                               Did the patient have a percutaneous coronary intervention (stent / angioplasty)?:  No.   _____________  Discharge Vitals Blood pressure 106/86, pulse (!) 109, temperature (!) 97.3 F (36.3 C),  temperature source Oral, resp. rate 18, height 5' 10.5" (1.791 m), weight 74.7 kg, SpO2 99 %.  Filed Weights   06/27/20 0401 06/28/20 0420 06/29/20 0444  Weight: 71.6 kg 72.7 kg 74.7 kg    Labs & Radiologic Studies    CBC Lab Results  Component Value Date   WBC 9.3 06/25/2020   HGB 12.9 (L) 06/25/2020   HCT 39.5 06/25/2020   MCV 88.8 06/25/2020   PLT 242 82/95/6213    Basic Metabolic Panel Recent Labs    06/28/20 0340 06/29/20 0350  NA 133* 131*  K 5.0 4.5  CL 99 98  CO2 26 25  GLUCOSE 133* 125*  BUN 30* 27*  CREATININE 1.42* 1.40*  CALCIUM 9.6 9.2   Liver Function Tests Lab Results  Component Value Date   ALT 34 06/22/2020   AST 46 (H) 06/22/2020   ALKPHOS 81 06/22/2020   BILITOT 1.8 (H) 06/22/2020    High Sensitivity Troponin:   Recent Labs  Lab 06/21/20 1508 06/21/20 1933  TROPONINIHS 109* 110*    _____________  DG Chest 2 View  Result Date: 06/21/2020 CLINICAL DATA:  Left-sided chest pain with shortness of breath for a few days. History of COPD. EXAM: CHEST - 2 VIEW COMPARISON:  Radiographs 04/12/2020 and 04/09/2020.  CT 04/09/2020. FINDINGS: PICC has been removed. There is stable moderate cardiomegaly with vascular congestion and chronic interstitial prominence. This does not appear significantly changed over the last several chest radiographs. Stable linear right upper lobe scarring. There is no confluent airspace opacity, pleural effusion or pneumothorax. The bones appear unchanged. IMPRESSION: Stable cardiomegaly, vascular congestion and chronic interstitial prominence. No acute findings. Electronically Signed   By: Richardean Sale M.D.   On: 06/21/2020 15:45   [place] EKG SITE RITE  Result Date: 06/22/2020 If Site Rite image not attached, placement could not be confirmed due to current cardiac rhythm.  Disposition   Pt is being discharged home today in good condition.  Follow-up Plans & Appointments     Follow-up Information     Savilla Turbyfill, Shaune Pascal,  MD Follow up in 1 week(s).   Specialty: Cardiology Why: We will arrange and contact you. Contact information: [place] [address] [phone]                Discharge Instructions     (HEART FAILURE PATIENTS) Call MD:  Anytime you have any of the following symptoms: 1) 3 pound weight gain in 24 hours or 5 pounds in 1 week 2) shortness of breath, with or without a dry hacking cough 3) swelling in the hands, feet or stomach 4) if you have to sleep on extra pillows at night in order to breathe.   Complete by: As directed    Call MD for:  difficulty breathing, headache or visual disturbances   Complete by: As directed    Diet - low sodium heart healthy   Complete by: As directed    Heart  Failure patients record your daily weight using the same scale at the same time of day   Complete by: As directed    Increase activity slowly   Complete by: As directed        Discharge Medications   Allergies as of 06/29/2020   No Known Allergies      Medication List     STOP taking these medications    docusate sodium 100 MG capsule Commonly known as: COLACE       TAKE these medications    albuterol 108 (90 Base) MCG/ACT inhaler Commonly known as: VENTOLIN HFA Inhale 2 puffs into the lungs every 4 (four) hours as needed for wheezing or shortness of breath.   aspirin 81 MG EC tablet Take 1 tablet (81 mg total) by mouth daily.   atorvastatin 80 MG tablet Commonly known as: LIPITOR Take 1 tablet (80 mg total) by mouth daily.   digoxin 0.125 MG tablet Commonly known as: LANOXIN Take 1 tablet (0.125 mg total) by mouth daily.   isosorbide-hydrALAZINE 20-37.5 MG tablet Commonly known as: BIDIL Take 1 tablet by mouth 3 (three) times daily.   multivitamin tablet Take 1 tablet by mouth daily.   Nitrostat 0.4 MG SL tablet Generic drug: nitroGLYCERIN PLACE 1 TABLET UNDER TONGUE AS NEEDED FOR CHEST PAIN EVERY 5 MINUTES X 3 MAX DOSES.  CALL  911 IF PAIN PERSISTS What changed: See the new instructions.   omeprazole 20 MG capsule Commonly known as: PRILOSEC Take 1 capsule (20 mg total) by mouth daily. What changed:  when to take this reasons to take this   torsemide 20 MG tablet Commonly known as: DEMADEX Take 2 tablets (40 mg total) by mouth daily.           Outstanding Labs/Studies   Will need f/u BMET and digoxin level @ outpatient follow-up.  Duration of Discharge Encounter   Greater than 30 minutes including physician time.  Signed, Murray Hodgkins, NP 06/29/2020, 8:46 AM   Agree with above. He is stable for d/c.   Glori Bickers, MD  1:38 PM

## 2020-07-01 ENCOUNTER — Telehealth (HOSPITAL_COMMUNITY): Payer: Self-pay

## 2020-07-02 ENCOUNTER — Telehealth (HOSPITAL_COMMUNITY): Payer: Self-pay | Admitting: Licensed Clinical Social Worker

## 2020-07-02 NOTE — Telephone Encounter (Signed)
CSW received notification from [facility] that they would be unable to take pt for rehab due to medical complexity.  CSW called pt to check in following hospital DC and discuss other potential rehab options.  Unable to reach- left VM requesting return call.  CSW will continue to reach out and assist as needed  [name], [facility] Desk#: [phone] Cell#: [phone]

## 2020-07-02 NOTE — Telephone Encounter (Signed)
Called pt regarding referral again to paramedicine post d/c. He did not answer, I was able to LVM for him to call me back. Will try again tomor.   Marylouise Stacks, EMT-Paramedic  07/01/2020

## 2020-07-03 ENCOUNTER — Telehealth (HOSPITAL_COMMUNITY): Payer: Self-pay

## 2020-07-03 ENCOUNTER — Telehealth: Payer: Self-pay

## 2020-07-03 NOTE — Telephone Encounter (Signed)
Attempted to reach pt again today for home visit post d/c. No answer. I did send him a text to reply back to me regarding home visit.   Marylouise Stacks, EMT-Paramedic  07/03/20

## 2020-07-03 NOTE — Telephone Encounter (Signed)
Transition Care Management Follow-up Telephone Call Date of discharge and from where: 06/29/2020, [facility]   Call placed to patient, he said that he was not able to talk now. He has the phone number for the clinic and said he would call back.   Needs to schedule  TOC with PCP at [facility].

## 2020-07-05 ENCOUNTER — Telehealth: Payer: Self-pay

## 2020-07-05 NOTE — Telephone Encounter (Signed)
Patient has not scheduled hospital follow up appt, letter sent to him requesting he call [facility] to schedule this appointment

## 2020-07-08 ENCOUNTER — Telehealth (HOSPITAL_COMMUNITY): Payer: Self-pay

## 2020-07-08 NOTE — Telephone Encounter (Signed)
Called pt twice today regarding need of home visit. Called him this morning, no answer. Called back this afternoon, no answer again.   Marylouise Stacks, EMT-Paramedic  07/08/20

## 2020-07-10 ENCOUNTER — Telehealth (HOSPITAL_COMMUNITY): Payer: Self-pay | Admitting: Licensed Clinical Social Worker

## 2020-07-10 MED FILL — TORSEMIDE 20 MG TABLET: 20 | 30 days supply | Qty: 120 | Fill #1
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
## 2020-07-11 ENCOUNTER — Telehealth (HOSPITAL_COMMUNITY): Payer: Self-pay | Admitting: Licensed Clinical Social Worker

## 2020-07-11 ENCOUNTER — Other Ambulatory Visit (HOSPITAL_COMMUNITY): Payer: Self-pay | Admitting: Cardiology

## 2020-07-11 ENCOUNTER — Other Ambulatory Visit (HOSPITAL_COMMUNITY): Payer: Self-pay

## 2020-07-11 MED ORDER — DIGOXIN 125 MCG PO TABS: 0.1250 mg | ORAL_TABLET | Freq: Every day | ORAL | 6 refills | Status: DC

## 2020-07-11 MED ORDER — ISOSORB DINITRATE-HYDRALAZINE 20-37.5 MG PO TABS: 1.0000 | ORAL_TABLET | Freq: Three times a day (TID) | ORAL | 6 refills | Status: DC

## 2020-07-11 NOTE — Telephone Encounter (Signed)
CSW called pt this morning to check in regarding housing situation.  Pt states he was able to stay at his girlfriends house last night (the address we have on file) and thinks he can continue to stay there- is agreeable to paramedic coming out there today to assist with his medications.  States he is out of the new medications the hospital DC'd him on cause the pharmacy didn't have him when he [place] and the hospital only gave him enough to get through the weekend- never went to fill them again.  CSW spoke with paramedic who will plan to see pt this afternoon.  Will continue to follow and assist as needed  [name], [facility] Desk#: [phone] Cell#: [phone]

## 2020-07-11 NOTE — Progress Notes (Signed)
Paramedicine Encounter    Patient ID: Tyler Wong, male    DOB: 15-Aug-1966, 54 y.o.   MRN: [id number]   Patient Care Team: [name], NP as PCP - General (Internal Medicine) [name], MD as PCP - Cardiology (Cardiology) [name], MD as PCP - Advanced Heart Failure (Cardiology)  Patient Active Problem List   Diagnosis Date Noted  . Cardiorenal syndrome 06/29/2020  . 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 Acute on chronic clinical systolic heart failure ([place]) 06/22/2020  . Acute on chronic systolic (congestive) heart failure ([place]) 06/22/2020  . Palliative care encounter   . Cardiogenic shock ([place]) 04/11/2020  . CKD (chronic kidney disease) stage 3, GFR 30-59 ml/min 04/11/2020  . Symptomatic anemia 04/09/2020  . Acute on chronic HFrEF (heart failure with reduced ejection fraction) ([place]) 05/05/2019  . HFrEF (heart failure with reduced ejection fraction) ([place]) 05/04/2019  . Acute combined systolic and diastolic congestive heart failure ([place]) 10/02/2018  . Hepatitis C 04/13/2017  . Current non-adherence to medical treatment 04/13/2017  . Acute on chronic combined systolic (congestive) and diastolic (congestive) heart failure ([place]) 04/13/2017  . HTN (hypertension) 04/12/2017  . Insomnia 01/18/2017  . Chronic combined systolic and diastolic congestive heart failure ([place]) 01/04/2017  . Hyperlipidemia 01/04/2017  . Chest pain 12/30/2016  . CAD S/P percutaneous coronary angioplasty 07/18/2014  . Tobacco abuse 07/17/2014  . Cocaine use 07/17/2014    Current Outpatient Medications:  .  albuterol (VENTOLIN HFA) 108 (90 Base) MCG/ACT inhaler, Inhale 2 puffs into the lungs every 4 (four) hours as needed for wheezing or shortness of breath., Disp: 6.7 g, Rfl: 1 .  aspirin 81 MG EC tablet, Take 1 tablet (81 mg total) by mouth daily., Disp: 30 tablet, Rfl: 5 .  atorvastatin (LIPITOR) 80 MG tablet, Take 1 tablet (80 mg total) by mouth daily., Disp: 90  tablet, Rfl: 3 .  digoxin (LANOXIN) 0.125 MG tablet, Take 1 tablet (0.125 mg total) by mouth daily., Disp: 30 tablet, Rfl: 6 .  torsemide (DEMADEX) 20 MG tablet, Take 2 tablets (40 mg total) by mouth daily., Disp: 60 tablet, Rfl: 4 .  isosorbide-hydrALAZINE (BIDIL) 20-37.5 MG tablet, Take 1 tablet by mouth 3 (three) times daily. (Patient not taking: Reported on 07/11/2020), Disp: 90 tablet, Rfl: 6 .  Multiple Vitamin (MULTIVITAMIN) tablet, Take 1 tablet by mouth daily. (Patient not taking: Reported on 07/11/2020), Disp: , Rfl:  .  NITROSTAT 0.4 MG SL tablet, PLACE 1 TABLET UNDER TONGUE AS NEEDED FOR CHEST PAIN EVERY 5 MINUTES X 3 MAX DOSES.  CALL 911 IF PAIN PERSISTS (Patient not taking: Reported on 07/11/2020), Disp: 25 tablet, Rfl: 3 .  omeprazole (PRILOSEC) 20 MG capsule, Take 1 capsule (20 mg total) by mouth daily. (Patient not taking: Reported on 07/11/2020), Disp: 30 capsule, Rfl: 3 No Known Allergies    Social History   Socioeconomic History  . Marital status: Single    Spouse name: Not on file  . Number of children: 2  . Years of education: 67  . Highest education level: 12th grade  Occupational History  . Occupation: Landscaping  Tobacco Use  . Smoking status: Current Every Day Smoker    Packs/day: 0.50    Years: 30.00    Pack years: 15.00    Types: Cigarettes  . Smokeless tobacco: Never Used  . Tobacco comment: .5 PK PER DAY  Vaping Use  . Vaping Use: Never used  Substance and Sexual Activity  . Alcohol use:  No    Comment: Patient denies abuse, states social drinker  . Drug use: Yes    Types: Cocaine    Comment: heroin  . Sexual activity: Yes  Other Topics Concern  . Not on file  Social History Narrative   Lives in [place] with his sister and girlfriend. He works Aeronautical engineer work.    Social Determinants of Health   Financial Resource Strain:   . Difficulty of Paying Living Expenses:   Food Insecurity: Food Insecurity Present  . Worried About  Charity fundraiser in the Last Year: Sometimes true  . Ran Out of Food in the Last Year: Never true  Transportation Needs: No Transportation Needs  . Lack of Transportation (Medical): No  . Lack of Transportation (Non-Medical): No  Physical Activity:   . Days of Exercise per Week:   . Minutes of Exercise per Session:   Stress:   . Feeling of Stress :   Social Connections:   . Frequency of Communication with Friends and Family:   . Frequency of Social Gatherings with Friends and Family:   . Attends Religious Services:   . Active Member of Clubs or Organizations:   . Attends Archivist Meetings:   Marland Kitchen Marital Status:   Intimate Partner Violence:   . Fear of Current or Ex-Partner:   . Emotionally Abused:   Marland Kitchen Physically Abused:   . Sexually Abused:     Physical Exam      Future Appointments  Date Time Provider [place]  08/27/2020 10:00 AM [facility] PA/NP [facility] None    BP 116/72   Pulse 92   Temp 98.2 F (36.8 C)   Resp 18   SpO2 97%     First visit with pt in the home since d/c. He texted me yesterday and said he was homeless, he then told jenna he was going to talk to his (ex)  gf and see if she would allow him to stay here so that is the plan for now.  I went to p/u his digoxin and bidil from [facility] said the bidil was too soon to fill.  Pt advised he never picked it up at all form the initial order for it.  He needs his meds to go to [facility] so they can waive his co-pay and deliver. Pt reports his breathing is doing ok.  He has not been weighing daily.  He states he has been urinating a lot since he has been back on his meds.  Last week was his most recent drug use.  We spoke about that and how imperative it is for him to stay clean. He was open to idea of rehab or something to help him. He did state he hadnt thought that far ahead since he is just now getting back in the home.  He has been taking torsemide 40mg  BID  not the 40mg  daily like his d/c instructions. That was corrected for him. Per d/c instructions he is to take 40mg  daily.  He needs a clinic f/u.  He will start taking the dig today.  Pt denies c/p, no dizziness.  He just feels tired and weak. --sent kamilah message asking for the clinic f/u appointment.   ---[name] got his bidil sent to [facility], I called [facility] and they will deliver that to him tomor. I let him know as well and he knows he has to answer phone or they will not deliver his meds.  Relayed  the poss rehab inquiry to [place] and she will call him on that.   Marylouise Stacks, [facility] Paramedic  07/11/20

## 2020-07-15 ENCOUNTER — Telehealth (HOSPITAL_COMMUNITY): Payer: Self-pay

## 2020-07-15 NOTE — Telephone Encounter (Signed)
[facility] to see which meds they on file for him, they have asa, atorvastatin, digoxin, bidil---they delivered on 7/15--torsemide.  I asked them to pull his albuterol and omeprazole over from [facility]-she will work on that.  In a few weeks if there are not any med changes, then I will get him moved over to bubble packs.   Marylouise Stacks, EMT-Paramedic  07/15/20

## 2020-07-16 NOTE — Telephone Encounter (Signed)
Attempted to reach out to pt to sch home visit this week, he did not answer.  I will be out of the office next week, so will have to f/u week after if I dont hear back from him this week.   Marylouise Stacks, EMT-Paramedic  07/16/20

## 2020-07-30 ENCOUNTER — Telehealth (HOSPITAL_COMMUNITY): Payer: Self-pay

## 2020-07-30 NOTE — Telephone Encounter (Signed)
Called pt to sch home visit this week, no answer but I did LVM for him to return my call.   Marylouise Stacks, EMT-Paramedic  07/30/20

## 2020-08-05 ENCOUNTER — Telehealth (HOSPITAL_COMMUNITY): Payer: Self-pay

## 2020-08-05 NOTE — Telephone Encounter (Signed)
I called pt again this morning. No answer. LVM for him to return my call.   Marylouise Stacks, EMT-Paramedic  08/05/20

## 2020-08-07 ENCOUNTER — Telehealth (HOSPITAL_COMMUNITY): Payer: Self-pay | Admitting: Licensed Clinical Social Worker

## 2020-08-07 NOTE — Telephone Encounter (Signed)
CSW attempted to call and check in- unable to reach- left VM requesting return call  [name], [facility] Desk#: [phone] Cell#: [phone]

## 2020-08-08 ENCOUNTER — Telehealth (HOSPITAL_COMMUNITY): Payer: Self-pay | Admitting: Licensed Clinical Social Worker

## 2020-08-08 ENCOUNTER — Other Ambulatory Visit (HOSPITAL_COMMUNITY): Payer: Self-pay

## 2020-08-08 NOTE — Telephone Encounter (Signed)
Pt called CSW to inform he is at the clinic and requesting bus passes- CSW provided with several bus passes.  Pt reports he is doing ok- no needs currently but might need help with transportation to appt later this month- CSW will plan to check in to make sure he has ride   [name], [facility] Desk#: [phone] Cell#: [phone]

## 2020-08-08 NOTE — Progress Notes (Signed)
Paramedicine Encounter    Patient ID: Tyler Wong, male    DOB: [birth year], 54 y.o.   MRN: [id number]   Patient Care Team: [name], NP as PCP - General (Internal Medicine) [name], MD as PCP - Cardiology (Cardiology) [name], MD as PCP - Advanced Heart Failure (Cardiology)  Patient Active Problem List   Diagnosis Date Noted   Cardiorenal syndrome 06/29/2020   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   Acute on chronic clinical systolic heart failure ([place]) 06/22/2020   Acute on chronic systolic (congestive) heart failure ([place]) 06/22/2020   Palliative care encounter    Cardiogenic shock ([place]) 04/11/2020   CKD (chronic kidney disease) stage 3, GFR 30-59 ml/min 04/11/2020   Symptomatic anemia 04/09/2020   Acute on chronic HFrEF (heart failure with reduced ejection fraction) ([place]) 05/05/2019   HFrEF (heart failure with reduced ejection fraction) ([place]) 05/04/2019   Acute combined systolic and diastolic congestive heart failure ([place]) 10/02/2018   Hepatitis C 04/13/2017   Current non-adherence to medical treatment 04/13/2017   Acute on chronic combined systolic (congestive) and diastolic (congestive) heart failure ([place]) 04/13/2017   HTN (hypertension) 04/12/2017   Insomnia 01/18/2017   Chronic combined systolic and diastolic congestive heart failure ([place]) 01/04/2017   Hyperlipidemia 01/04/2017   Chest pain 12/30/2016   CAD S/P percutaneous coronary angioplasty 07/18/2014   Tobacco abuse 07/17/2014   Cocaine use 07/17/2014    Current Outpatient Medications:    albuterol (VENTOLIN HFA) 108 (90 Base) MCG/ACT inhaler, Inhale 2 puffs into the lungs every 4 (four) hours as needed for wheezing or shortness of breath., Disp: 6.7 g, Rfl: 1   aspirin 81 MG EC tablet, Take 1 tablet (81 mg total) by mouth daily., Disp: 30 tablet, Rfl: 5   atorvastatin (LIPITOR) 80 MG tablet, Take 1 tablet (80 mg total) by mouth daily., Disp: 90  tablet, Rfl: 3   digoxin (LANOXIN) 0.125 MG tablet, Take 1 tablet (0.125 mg total) by mouth daily., Disp: 30 tablet, Rfl: 6   isosorbide-hydrALAZINE (BIDIL) 20-37.5 MG tablet, Take 1 tablet by mouth 3 (three) times daily., Disp: 90 tablet, Rfl: 6   torsemide (DEMADEX) 20 MG tablet, Take 2 tablets (40 mg total) by mouth daily., Disp: 60 tablet, Rfl: 4   Multiple Vitamin (MULTIVITAMIN) tablet, Take 1 tablet by mouth daily. (Patient not taking: Reported on 07/11/2020), Disp: , Rfl:    NITROSTAT 0.4 MG SL tablet, PLACE 1 TABLET UNDER TONGUE AS NEEDED FOR CHEST PAIN EVERY 5 MINUTES X 3 MAX DOSES.  CALL 911 IF PAIN PERSISTS (Patient not taking: Reported on 07/11/2020), Disp: 25 tablet, Rfl: 3   omeprazole (PRILOSEC) 20 MG capsule, Take 1 capsule (20 mg total) by mouth daily. (Patient not taking: Reported on 07/11/2020), Disp: 30 capsule, Rfl: 3 No Known Allergies    Social History   Socioeconomic History   Marital status: Single    Spouse name: Not on file   Number of children: 2   Years of education: 13   Highest education level: 12th grade  Occupational History   Occupation: Landscaping  Tobacco Use   Smoking status: Current Every Day Smoker    Packs/day: 0.50    Years: 30.00    Pack years: 15.00    Types: Cigarettes   Smokeless tobacco: Never Used   Tobacco comment: .5 PK PER DAY  Vaping Use   Vaping Use: Never used  Substance and Sexual Activity   Alcohol use: No    Comment: Patient  denies abuse, states social drinker   Drug use: Yes    Types: Cocaine    Comment: heroin   Sexual activity: Yes  Other Topics Concern   Not on file  Social History Narrative   Lives in [place] with his sister and girlfriend. He works Aeronautical engineer work.    Social Determinants of Health   Financial Resource Strain:    Difficulty of Paying Living Expenses:   Food Insecurity: Food Insecurity Present   Worried About Running Out of Food in the Last Year: Sometimes  true   Ran Out of Food in the Last Year: Never true  Transportation Needs: No Transportation Needs   Lack of Transportation (Medical): No   Lack of Transportation (Non-Medical): No  Physical Activity:    Days of Exercise per Week:    Minutes of Exercise per Session:   Stress:    Feeling of Stress :   Social Connections:    Frequency of Communication with Friends and Family:    Frequency of Social Gatherings with Friends and Family:    Attends Religious Services:    Active Member of Clubs or Organizations:    Attends Archivist Meetings:    Marital Status:   Intimate Partner Violence:    Fear of Current or Ex-Partner:    Emotionally Abused:    Physically Abused:    Sexually Abused:     Physical Exam      Future Appointments  Date Time Provider [place]  08/27/2020 10:00 AM [facility] PA/NP [facility] None    BP 110/78    Pulse 100    Temp 97.8 F (36.6 C)    Resp 18    SpO2 98%   Weight yesterday-176  Last visit weight-?? Didn't have one 3  Pt reports he has been home mostly, he has been c/o stomach pains. He has been trying to use laxatives and a probiotic and finally it works. But then he feels like his bowels get stuck in his stomach and causes bloating. I suggested he go see his PCP as with prolonged cocaine use it could cause the GI issues or it could be his heart his weakened and not pumping blood to those other organs??  Spoke with lauren and she suggestded miralax when he needs it but its best to try a colace softener daily first. He states his urine output is good. Color is yellow. States he drinks a lot of water Last drug was 3 wks ago. Not really interested in rehab.  meds verified-he does not use pill box. He states sometimes he takes 40mg  extra if he feels like the 40mg  am dose doesn't work then he will take the extra-averaging about once a week.  He states he has been taking a potassium supplement every day b/c he had cramps- I  told him that if he needed potassium he would be prescribed it and his recent labs his potassium was fine. He said the cramps didn't come often but it hurt the few times it happened. But I have asked him to hold off on that potassium until his next clinic visit to get a good lab value based on his prescriptions.  He is in agreement for bubble packs. I will talk to victor at [facility] for that.  I will take med list to victor next week to start the bubble packs.  Pt seemed quite anxious today and like in a rush for visit to be done.   Marylouise Stacks, EMT-Paramedic  [facility]  08/08/20

## 2020-08-12 ENCOUNTER — Telehealth (HOSPITAL_COMMUNITY): Payer: Self-pay

## 2020-08-12 ENCOUNTER — Other Ambulatory Visit (HOSPITAL_COMMUNITY): Payer: Self-pay | Admitting: *Deleted

## 2020-08-12 MED ORDER — TORSEMIDE 20 MG PO TABS: 40.0000 mg | ORAL_TABLET | Freq: Every day | ORAL | 4 refills | Status: DC

## 2020-08-14 ENCOUNTER — Other Ambulatory Visit (HOSPITAL_COMMUNITY): Payer: Self-pay

## 2020-08-14 NOTE — Telephone Encounter (Signed)
Reached out to pt regarding home visit, I had spoken to victor at pharmacy and he is going to have bubble packs together this week and I will deliver so I know he understands how to take them.  No answer.   Marylouise Stacks, EMT-Paramedic  08/12/2020

## 2020-08-14 NOTE — Progress Notes (Signed)
Telephone encounter---  Contacted pt again but no answer again.  I called pharmacy and they were able to reach him yesterday and they delivered his bubble packs of meds. Pharmacy confirmed that he answered the phone and they were able to deliver them. Pt not responding back to me, but at least he has his meds.   Marylouise Stacks, EMT-Paramedic  08/14/20

## 2020-08-19 ENCOUNTER — Telehealth (HOSPITAL_COMMUNITY): Payer: Self-pay | Admitting: Licensed Clinical Social Worker

## 2020-08-19 NOTE — Telephone Encounter (Signed)
Pt called CSW and requested bus passes.  CSW provided pt with bus passes and pt confirms he has been in contact with paramedic and plans to see this week.  No further needs at this time  [name], [facility] Desk#: [phone] Cell#: [phone]

## 2020-08-21 ENCOUNTER — Telehealth (HOSPITAL_COMMUNITY): Payer: Self-pay

## 2020-08-21 NOTE — Telephone Encounter (Signed)
Pt texted me at 7am today to cancel our 9am appointment. He said he would not be home at that time. He asked for either this afternoon or tomor appointment--I texted back when I got in office that my afternoon is booked and I could do tomor at 9 but no response yet.    Marylouise Stacks, EMT-Paramedic  08/21/20

## 2020-08-22 ENCOUNTER — Telehealth (HOSPITAL_COMMUNITY): Payer: Self-pay | Admitting: Licensed Clinical Social Worker

## 2020-08-22 ENCOUNTER — Telehealth (HOSPITAL_COMMUNITY): Payer: Self-pay

## 2020-08-22 NOTE — Telephone Encounter (Signed)
Pt is now staying with someone in [place] which is outside [employer] area.  Paramedic informed pt that he would be discharged from the program at this time but encouraged pt to reach back out if he comes back to [facility] and is in need of assistance.  Will continue to follow and assist as needed  [name], [facility] Desk#: [phone] Cell#: [phone]

## 2020-08-22 NOTE — Telephone Encounter (Signed)
I contacted pt to see if he was still able to keep his rescheduled appointment with me this morning, he replied that he was not able to, that he was still in [place].   Marylouise Stacks, EMT-Paramedic  08/22/20

## 2020-08-26 ENCOUNTER — Telehealth: Payer: Self-pay | Admitting: Licensed Clinical Social Worker

## 2020-08-26 NOTE — Telephone Encounter (Signed)
CSW called pt to remind of appt tomorrow at clinic and confirm he has transportation to come to that appt.  Pt confirms he has transport for tomorrow and will be able to come.  No further needs at this time  [name], [facility] Desk#: [phone] Cell#: [phone]

## 2020-08-27 ENCOUNTER — Ambulatory Visit (HOSPITAL_COMMUNITY)
Admission: RE | Admit: 2020-08-27 | Discharge: 2020-08-27 | Disposition: A | Discharge status: Home / Self Care | Payer: Medicaid Other | Source: Ambulatory Visit | Attending: Internal Medicine | Admitting: Internal Medicine

## 2020-08-27 ENCOUNTER — Other Ambulatory Visit: Payer: Self-pay

## 2020-08-27 ENCOUNTER — Encounter (HOSPITAL_COMMUNITY): Payer: Self-pay

## 2020-08-27 VITALS — BP 102/70 | HR 88 | Ht 70.5 in | Wt 166.0 lb

## 2020-08-27 DIAGNOSIS — Z79899 Other long term (current) drug therapy: Secondary | ICD-10-CM | POA: Insufficient documentation

## 2020-08-27 DIAGNOSIS — I5023 Acute on chronic systolic (congestive) heart failure: Secondary | ICD-10-CM | POA: Diagnosis present

## 2020-08-27 DIAGNOSIS — I5042 Chronic combined systolic (congestive) and diastolic (congestive) heart failure: Secondary | ICD-10-CM | POA: Diagnosis not present

## 2020-08-27 DIAGNOSIS — F17210 Nicotine dependence, cigarettes, uncomplicated: Secondary | ICD-10-CM | POA: Insufficient documentation

## 2020-08-27 DIAGNOSIS — Z8249 Family history of ischemic heart disease and other diseases of the circulatory system: Secondary | ICD-10-CM | POA: Insufficient documentation

## 2020-08-27 DIAGNOSIS — F1410 Cocaine abuse, uncomplicated: Secondary | ICD-10-CM | POA: Insufficient documentation

## 2020-08-27 DIAGNOSIS — I2510 Atherosclerotic heart disease of native coronary artery without angina pectoris: Secondary | ICD-10-CM | POA: Diagnosis not present

## 2020-08-27 DIAGNOSIS — J449 Chronic obstructive pulmonary disease, unspecified: Secondary | ICD-10-CM | POA: Insufficient documentation

## 2020-08-27 DIAGNOSIS — Z7982 Long term (current) use of aspirin: Secondary | ICD-10-CM | POA: Insufficient documentation

## 2020-08-27 DIAGNOSIS — I110 Hypertensive heart disease with heart failure: Secondary | ICD-10-CM | POA: Diagnosis not present

## 2020-08-27 DIAGNOSIS — Z955 Presence of coronary angioplasty implant and graft: Secondary | ICD-10-CM | POA: Diagnosis not present

## 2020-08-27 DIAGNOSIS — Z7984 Long term (current) use of oral hypoglycemic drugs: Secondary | ICD-10-CM | POA: Insufficient documentation

## 2020-08-27 DIAGNOSIS — I252 Old myocardial infarction: Secondary | ICD-10-CM | POA: Insufficient documentation

## 2020-08-27 LAB — BASIC METABOLIC PANEL
Anion gap: 9 (ref 5–15)
BUN: 30 mg/dL — ABNORMAL HIGH (ref 6–20)
CO2: 32 mmol/L (ref 22–32)
Calcium: 8.4 mg/dL — ABNORMAL LOW (ref 8.9–10.3)
Chloride: 98 mmol/L (ref 98–111)
Creatinine, Ser: 1.88 mg/dL — ABNORMAL HIGH (ref 0.61–1.24)
GFR calc Af Amer: 46 mL/min — ABNORMAL LOW (ref >60)
GFR calc non Af Amer: 40 mL/min — ABNORMAL LOW (ref >60)
Glucose, Bld: 156 mg/dL — ABNORMAL HIGH (ref 70–99)
Potassium: 3 mmol/L — ABNORMAL LOW (ref 3.5–5.1)
Sodium: 139 mmol/L (ref 135–145)

## 2020-08-27 LAB — BRAIN NATRIURETIC PEPTIDE: B Natriuretic Peptide: 1036.1 pg/mL — ABNORMAL HIGH (ref 0.0–100.0)

## 2020-08-27 LAB — DIGOXIN LEVEL: Digoxin Level: <0.2 ng/mL — ABNORMAL LOW (ref 0.8–2.0)

## 2020-08-27 MED ORDER — TORSEMIDE 20 MG PO TABS: 20.0000 mg | ORAL_TABLET | ORAL | 0 refills | Status: DC | PRN

## 2020-08-27 MED ORDER — EMPAGLIFLOZIN 10 MG PO TABS
10.0000 mg | ORAL_TABLET | Freq: Every day | ORAL | 11 refills | Status: DC
Start: 2020-08-27 — End: 2020-10-09

## 2020-08-27 MED ORDER — TORSEMIDE 20 MG PO TABS: ORAL_TABLET | ORAL | 0 refills | Status: DC

## 2020-08-27 NOTE — Patient Instructions (Addendum)
START Jardiance 10 mg, one tab daily   Be sure to only take Torsemide 20 mg as needed for 3 lb weight gain overnight or 5 lb weight gain in a week-these tablets will be outside of the pill packs   Labs today We will only contact you if something comes back abnormal or we need to make some changes. Otherwise no news is good news!  Your physician recommends that you schedule a follow-up appointment in: 3-4 weeks with pharmacy team  Your physician recommends that you schedule a follow-up appointment in: 8 weeks with Dr Aundra Dubin   If you have any questions or concerns before your next appointment please send [place] a message through [facility] or call our office at [phone].    TO LEAVE A MESSAGE FOR THE NURSE SELECT OPTION 2, PLEASE LEAVE A MESSAGE INCLUDING: . YOUR NAME . DATE OF BIRTH . CALL BACK NUMBER . REASON FOR CALL**this is important as we prioritize the call backs  YOU WILL RECEIVE A CALL BACK THE SAME DAY AS LONG AS YOU CALL BEFORE 4:00 PM

## 2020-08-27 NOTE — Progress Notes (Signed)
Advanced Heart Failure Clinic Note   Referring Physician: PCP: [name], NP PCP-Cardiologist: Shelva Majestic, MD  [facility]: Dr. Aundra Dubin   Reason for Visit: [facility] F/u for Systolic Heart Failure   HPI: Tyler Wong is a 54 year old with a history of CAD, DES to LAD in 2015, HTN, cocaine, and chronic systolic heart failure.   Admitted 10/02/18 with increased shortness of breathand chest pain. Underwent RHC/LHC with significant CAD but no target. Possible that low EF is due to cocaine abuse. HF medications started.   In the past he was followed in the HF clinic and HF Paramedicine. He was discharged from HF Paramedicine July 2020 due to lack of contact.   He was admitted to [facility] 4/21 w/ acute on chronic systolic HF w/ low output and volume overload, in the setting of missed medications and cocaine use. Pertinent admission labs: SARS 2 negative, BNP 1195, WBC 11, HS Trop 173>124, HIV NR, lactic acid 4, and UDS + cocaine.  Had low CO-OX and hypotension. Started milrinone 0.25 mcg and IV Lasix. Co-ox and CVPs monitored through PICC line. Co-ox improved w/ milrinone and he diuresed well. Lactic acid normalized. He was weaned of milrinone w/ marginal co-ox, however not a candidate for home inotropes nor advanced therapies due to drug abuse and poor compliance. He was placed on GDMT. He was able to tolerate losartan but BP was too soft, at the time, for [facility]. No  blocker due to low output.  He was transitioned off of IV Lasix and to PO torsemide. Was discharged home on 4/19. D/c wt was 159 lb.   At his f/u visit 4/29, labs showed an AKI + hyperkalemia. He was instructed to temporarily hold torsemide, stop losartan, spiro and dig. He was ordered to return for repeat clinic f/u and repeat labs but no showed. Subsequently, he developed recurrent volume overload and was readmitted to [facility] in July for acute CHF exacerbation c/b low output. He required milrinone to aid w/ diuresis. UDS also + for  cocaine. Responded well to IV Lasix. Milrinone weaned. Co-ox remained stable off inotrope. Not a candidate for home milrinone w/ drug use. He was restarted on toremide. He failed retial of spironolactone (had hyperkalemia). Losartan not added back. He was placed on Bidil. D/c wt was 164 lb.   He returns back for f/u. Feels that he is doing ok. Denies any significant exertional dyspnea today. No resting symptoms. He can tell days that he is fluid overloaded, typically occurs after he eats a high sodium meal. He does admit to some cocaine use since discharge, but no use in the last 2 weeks. No CP. BP soft 102/70. No orthostatic symptoms. No LEE, orthopnea/ PND. Still smoking cigarettes. He is 2 lb up frrom recent d/c wt, 164>>166 lb.     Review of Systems: [y] = yes, [ ]  = no   General: Weight gain [ ] ; Weight loss [ ] ; Anorexia [ ] ; Fatigue [ Y]; Fever [ ] ; Chills [ ] ; Weakness [ ]   Cardiac: Chest pain/pressure [ ] ; Resting SOB [ ] ; Exertional SOB [ ] ; Orthopnea [ ] ; Pedal Edema [ ] ; Palpitations [ ] ; Syncope [ ] ; Presyncope [ ] ; Paroxysmal nocturnal dyspnea[ ]   Pulmonary: Cough [ ] ; Wheezing[ ] ; Hemoptysis[ ] ; Sputum [ ] ; Snoring [ ]   GI: Vomiting[ ] ; Dysphagia[ ] ; Melena[ ] ; Hematochezia [ ] ; Heartburn[ ] ; Abdominal pain [ ] ; Constipation [ ] ; Diarrhea [ ] ; BRBPR [ ]   GU: Hematuria[ ] ; Dysuria [ ] ;  Nocturia[ ]   Vascular: Pain in legs with walking [ ] ; Pain in feet with lying flat [ ] ; Non-healing sores [ ] ; Stroke [ ] ; TIA [ ] ; Slurred speech [ ] ;  Neuro: Headaches[ ] ; Vertigo[ ] ; Seizures[ ] ; Paresthesias[ ] ;Blurred vision [ ] ; Diplopia [ ] ; Vision changes [ ]   Ortho/Skin: Arthritis [ ] ; Joint pain [ ] ; Muscle pain [ ] ; Joint swelling [ ] ; Back Pain [ ] ; Rash [ ]   Psych: Depression[ ] ; Anxiety[ ]   Heme: Bleeding problems [ ] ; Clotting disorders [ ] ; Anemia [ ]   Endocrine: Diabetes [ ] ; Thyroid dysfunction[ ]    Past Medical History:  Diagnosis Date  . CAD in native artery 07/17/14   STEMI-  LAD stenosis with DES  . CHF (congestive heart failure) ([place])   . Cocaine abuse ([place])   . COPD (chronic obstructive pulmonary disease) ([place])   . Dyspnea   . Hypertension   . ST elevation myocardial infarction (STEMI) involving left anterior descending (LAD) coronary artery with complication ([place])   . Tobacco abuse 07/17/2014    Current Outpatient Medications  Medication Sig Dispense Refill  . albuterol (VENTOLIN HFA) 108 (90 Base) MCG/ACT inhaler Inhale 2 puffs into the lungs every 4 (four) hours as needed for wheezing or shortness of breath. 6.7 g 1  . aspirin 81 MG EC tablet Take 1 tablet (81 mg total) by mouth daily. 30 tablet 5  . atorvastatin (LIPITOR) 80 MG tablet Take 1 tablet (80 mg total) by mouth daily. 90 tablet 3  . digoxin (LANOXIN) 0.125 MG tablet Take 1 tablet (0.125 mg total) by mouth daily. 30 tablet 6  . isosorbide-hydrALAZINE (BIDIL) 20-37.5 MG tablet Take 1 tablet by mouth 3 (three) times daily. 90 tablet 6  . Multiple Vitamin (MULTIVITAMIN) tablet Take 1 tablet by mouth daily.     Marland Kitchen omeprazole (PRILOSEC) 20 MG capsule Take 1 capsule (20 mg total) by mouth daily. 30 capsule 3  . torsemide (DEMADEX) 20 MG tablet Take 2 tablets (40 mg total) by mouth daily. 60 tablet 4  . NITROSTAT 0.4 MG SL tablet PLACE 1 TABLET UNDER TONGUE AS NEEDED FOR CHEST PAIN EVERY 5 MINUTES X 3 MAX DOSES.  CALL 911 IF PAIN PERSISTS (Patient not taking: Reported on 07/11/2020) 25 tablet 3   No current facility-administered medications for this encounter.    No Known Allergies    Social History   Socioeconomic History  . Marital status: Single    Spouse name: Not on file  . Number of children: 2  . Years of education: 67  . Highest education level: 12th grade  Occupational History  . Occupation: Landscaping  Tobacco Use  . Smoking status: Current Every Day Smoker    Packs/day: 0.50    Years: 30.00    Pack years: 15.00    Types: Cigarettes  . Smokeless tobacco: Never Used  . Tobacco  comment: .5 PK PER DAY  Vaping Use  . Vaping Use: Never used  Substance and Sexual Activity  . Alcohol use: No    Comment: Patient denies abuse, states social drinker  . Drug use: Yes    Types: Cocaine    Comment: heroin  . Sexual activity: Yes  Other Topics Concern  . Not on file  Social History Narrative   Lives in [place] with his sister and girlfriend. He works Aeronautical engineer work.    Social Determinants of Health   Financial Resource Strain:   . Difficulty of Paying Living Expenses: Not  on file  Food Insecurity: Food Insecurity Present  . Worried About Charity fundraiser in the Last Year: Sometimes true  . Ran Out of Food in the Last Year: Never true  Transportation Needs: No Transportation Needs  . Lack of Transportation (Medical): No  . Lack of Transportation (Non-Medical): No  Physical Activity:   . Days of Exercise per Week: Not on file  . Minutes of Exercise per Session: Not on file  Stress:   . Feeling of Stress : Not on file  Social Connections:   . Frequency of Communication with Friends and Family: Not on file  . Frequency of Social Gatherings with Friends and Family: Not on file  . Attends Religious Services: Not on file  . Active Member of Clubs or Organizations: Not on file  . Attends Archivist Meetings: Not on file  . Marital Status: Not on file  Intimate Partner Violence:   . Fear of Current or Ex-Partner: Not on file  . Emotionally Abused: Not on file  . Physically Abused: Not on file  . Sexually Abused: Not on file      Family History  Problem Relation Age of Onset  . Cirrhosis Father   . Heart attack Mother   . Heart attack Sister   . Heart failure Sister   . Heart attack Brother     Vitals:   08/27/20 1011  BP: 102/70  Pulse: 88  SpO2: 97%  Weight: 75.3 kg (166 lb)  Height: 5' 10.5" (1.791 m)   PHYSICAL EXAM: General:  Thin, mildly fatigue appearing. No respiratory difficulty HEENT: normal Neck: supple.  no JVD. Carotids 2+ bilat; no bruits. No lymphadenopathy or thyromegaly appreciated. Cor: PMI nondisplaced. Regular rate & rhythm. 2/6 Tyler murmur at apex  Lungs: clear Abdomen: soft, nontender, nondistended. No hepatosplenomegaly. No bruits or masses. Good bowel sounds. Extremities: no cyanosis, clubbing, rash, edema Neuro: alert & oriented x 3, cranial nerves grossly intact. moves all 4 extremities w/o difficulty. Affect pleasant.   ECG: not performed    ASSESSMENT & PLAN:  1. Acute on chronic systolic CHF: Echo in 3149 with EF 40-45%, thought to be ischemic cardiomyopathy. Cath in 10/19 showed 90% ostial OM1 and 70% PLV, no interventional target. Suspect mixed ischemic/nonischemic cardiomyopathy. 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 2 recent admits for cardiogenic shock in the last 6 months =>improved on milrinone but weaned off w/ marginal co-ox. Unfortunately, not a candidate for advanced therapies or home milrinone with active use of cocaine.  - NYHA II-III, volume status up based on BNP (1,036) - BP too soft for Entresto - SCr 1.8, GFR 40. K 3.0  - Start Jardiance 10 mg daily (hgb a1c 6.3) - Increase torsemide to 40 qam/ 20 qpm x 2 days then return to 40 mg daily  - start 20 mEq of KCl daily  - Continue digoxin 0.125 daily. Check dig level today  - did not tolerate spiro due to persistent hyperkalemia. If he continues to have issues w/ fluid overload, may consider restarting and adding daily Veltassa for hyperkalemia  --no ? blocker w/ recent shock and current fatigue  - Repeat BMP in 7 days  -Not a candidate for advanced therapies or home milrinone with active use of cocaine.  - he reports that he is still  smoking but denies any further cocaine use to me. If he can refrain  from further use, may be able to re-evaluate for advanced therapies in the future.  - he needs to re-enroll in paramedicine. Will refer back  2. CAD: H/o anterior STEMI with DES to LAD in 7/15.As above, cath in 10/19 with 90% ostial OM1 and 70% PLV, no interventional target.  - no ischemic CP -ContinueASA 81 and atorvastatin 80 mg daily.  3. Cocaine abuse: continues to use cocaine. We disused importance of complete abstinence  4. Active smoker: Counseled to quit.  F/u in 2-3 weeks to reassess fluid status and  further titrate meds  Lyda Jester, PA-C 08/27/20

## 2020-08-27 NOTE — Progress Notes (Signed)
CSW saw pt in clinic.  Pt states that he is doing well with the pill packs at home and has requested extra torsemide from physician to have in case he is concerned about swelling- they have sent in to pharmacy.  Pt is back living with his daughters mother and feels like he is doing pretty well- no needs at this time.  CSW provided with 2 bus passes to get home.  Will continue to follow through clinic and assist as needed  [name], [facility] Desk#: [phone] Cell#: [phone]

## 2020-08-29 ENCOUNTER — Telehealth (HOSPITAL_COMMUNITY): Payer: Self-pay

## 2020-08-29 MED ORDER — POTASSIUM CHLORIDE CRYS ER 20 MEQ PO TBCR
20.0000 meq | EXTENDED_RELEASE_TABLET | Freq: Every day | ORAL | 3 refills | Status: DC
Start: 2020-08-29 — End: 2020-10-09

## 2020-08-29 NOTE — Telephone Encounter (Signed)
Contacted pts pharmacy to see when his next month of bubble packs are due to be set up and delivered. It wont be for another 2 wks until he can do the bubble packs and he did not get the rx of the potassium sent over.  I called triage nurse and she will send the potassium and they also have samples so she will get me a couple wks of those and I will take the extra torsemide, potassium and jardiance samples to him.   Marylouise Stacks, EMT-Paramedic  08/29/20

## 2020-08-29 NOTE — Telephone Encounter (Signed)
Called pt numerous times today along with texting to see if he was home or could be home to get a short supply of those med changes that was made in clinic this week until his bubble packs can be renewed. No answer.  Will try again next week.   Marylouise Stacks, EMT-Paramedic  08/29/20

## 2020-08-29 NOTE — Telephone Encounter (Signed)
Medication Samples have been provided to the patient.  Drug name: Jardiance       Strength: 10mg         Qty: 4  LOT: [id number]   Exp.Date: 02/22   Dosing instructions: take 1 tab po qd  The patient has been instructed regarding the correct time, dose, and frequency of taking this medication, including desired effects and most common side effects.   Veronica Guerrant R Audrea Bolte 8:30 PM 08/29/2020

## 2020-09-04 ENCOUNTER — Other Ambulatory Visit (HOSPITAL_COMMUNITY): Payer: Self-pay

## 2020-09-04 NOTE — Progress Notes (Signed)
Paramedicine Encounter    Patient ID: Tyler Wong, male    DOB: 1966/11/27, 54 y.o.   MRN: [id number]   Patient Care Team: [name], NP as PCP - General (Internal Medicine) [name], MD as PCP - Cardiology (Cardiology) [name], MD as PCP - Advanced Heart Failure (Cardiology)  Patient Active Problem List   Diagnosis Date Noted  . Cardiorenal syndrome 06/29/2020  . 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 Acute on chronic clinical systolic heart failure ([place]) 06/22/2020  . Acute on chronic systolic (congestive) heart failure ([place]) 06/22/2020  . Palliative care encounter   . Cardiogenic shock ([place]) 04/11/2020  . CKD (chronic kidney disease) stage 3, GFR 30-59 ml/min 04/11/2020  . Symptomatic anemia 04/09/2020  . Acute on chronic HFrEF (heart failure with reduced ejection fraction) ([place]) 05/05/2019  . HFrEF (heart failure with reduced ejection fraction) ([place]) 05/04/2019  . Acute combined systolic and diastolic congestive heart failure ([place]) 10/02/2018  . Hepatitis C 04/13/2017  . Current non-adherence to medical treatment 04/13/2017  . Acute on chronic combined systolic (congestive) and diastolic (congestive) heart failure ([place]) 04/13/2017  . HTN (hypertension) 04/12/2017  . Insomnia 01/18/2017  . Chronic combined systolic and diastolic congestive heart failure ([place]) 01/04/2017  . Hyperlipidemia 01/04/2017  . Chest pain 12/30/2016  . CAD S/P percutaneous coronary angioplasty 07/18/2014  . Tobacco abuse 07/17/2014  . Cocaine use 07/17/2014    Current Outpatient Medications:  .  albuterol (VENTOLIN HFA) 108 (90 Base) MCG/ACT inhaler, Inhale 2 puffs into the lungs every 4 (four) hours as needed for wheezing or shortness of breath., Disp: 6.7 g, Rfl: 1 .  aspirin 81 MG EC tablet, Take 1 tablet (81 mg total) by mouth daily., Disp: 30 tablet, Rfl: 5 .  atorvastatin (LIPITOR) 80 MG tablet, Take 1 tablet (80 mg total) by mouth daily., Disp: 90  tablet, Rfl: 3 .  digoxin (LANOXIN) 0.125 MG tablet, Take 1 tablet (0.125 mg total) by mouth daily., Disp: 30 tablet, Rfl: 6 .  empagliflozin (JARDIANCE) 10 MG TABS tablet, Take 1 tablet (10 mg total) by mouth daily before breakfast., Disp: 30 tablet, Rfl: 11 .  isosorbide-hydrALAZINE (BIDIL) 20-37.5 MG tablet, Take 1 tablet by mouth 3 (three) times daily., Disp: 90 tablet, Rfl: 6 .  Multiple Vitamin (MULTIVITAMIN) tablet, Take 1 tablet by mouth daily. , Disp: , Rfl:  .  NITROSTAT 0.4 MG SL tablet, PLACE 1 TABLET UNDER TONGUE AS NEEDED FOR CHEST PAIN EVERY 5 MINUTES X 3 MAX DOSES.  CALL 911 IF PAIN PERSISTS (Patient not taking: Reported on 07/11/2020), Disp: 25 tablet, Rfl: 3 .  omeprazole (PRILOSEC) 20 MG capsule, Take 1 capsule (20 mg total) by mouth daily., Disp: 30 capsule, Rfl: 3 .  potassium chloride SA (KLOR-CON) 20 MEQ tablet, Take 1 tablet (20 mEq total) by mouth daily., Disp: 90 tablet, Rfl: 3 .  torsemide (DEMADEX) 20 MG tablet, Take 40 mg daily and additional 20 mg as needed For 3 lb weight gain over night or 5 lb weight gain in a week, Disp: 30 tablet, Rfl: 0 No Known Allergies    Social History   Socioeconomic History  . Marital status: Single    Spouse name: Not on file  . Number of children: 2  . Years of education: 29  . Highest education level: 12th grade  Occupational History  . Occupation: Landscaping  Tobacco Use  . Smoking status: Current Every Day Smoker    Packs/day: 0.50  Years: 30.00    Pack years: 15.00    Types: Cigarettes  . Smokeless tobacco: Never Used  . Tobacco comment: .5 PK PER DAY  Vaping Use  . Vaping Use: Never used  Substance and Sexual Activity  . Alcohol use: No    Comment: Patient denies abuse, states social drinker  . Drug use: Yes    Types: Cocaine    Comment: heroin  . Sexual activity: Yes  Other Topics Concern  . Not on file  Social History Narrative   Lives in [place] with his sister and girlfriend. He works Water quality scientist work.    Social Determinants of Health   Financial Resource Strain:   . Difficulty of Paying Living Expenses: Not on file  Food Insecurity: Food Insecurity Present  . Worried About Charity fundraiser in the Last Year: Sometimes true  . Ran Out of Food in the Last Year: Never true  Transportation Needs: No Transportation Needs  . Lack of Transportation (Medical): No  . Lack of Transportation (Non-Medical): No  Physical Activity:   . Days of Exercise per Week: Not on file  . Minutes of Exercise per Session: Not on file  Stress:   . Feeling of Stress : Not on file  Social Connections:   . Frequency of Communication with Friends and Family: Not on file  . Frequency of Social Gatherings with Friends and Family: Not on file  . Attends Religious Services: Not on file  . Active Member of Clubs or Organizations: Not on file  . Attends Archivist Meetings: Not on file  . Marital Status: Not on file  Intimate Partner Violence:   . Fear of Current or Ex-Partner: Not on file  . Emotionally Abused: Not on file  . Physically Abused: Not on file  . Sexually Abused: Not on file    Physical Exam      Future Appointments  Date Time Provider [place]  09/25/2020 12:00 PM [facility] None  10/28/2020 12:00 PM [name], MD [facility] None    BP 104/82   Pulse 90   Temp 97.6 F (36.4 C)   Resp 18   SpO2 98%   Weight yesterday-166 Last visit weight-166 @ clinic   P/u samples of jardiance and p/u enough potassium and extra torsemide from pharmacy to get him though until his bubble packs are needed to be filled--in about 1.5 wks.  He states he can tell when he has extra fluid is the feeling he gets in his abd-"weird" full feeling. He is weighing daily for most part. He does see a change in weight sometimes.  Those med changes were made today.  He did not have the extra torsemide last week to take the extra like she wanted him to.  He  said today he feels normal, yesterday he felt bad with increased sob, but that has resolved.  I advised him to cut back on his fluids to prevent the buildup if possible is better than him drinking a lot of fluid and then he have to take more medicine.  Lungs clear. No edema noted.  He did report that a few days ago while he was laying around the house he began to have rapid heart beat, it lasted about 10 seconds. havent happened again since. He denied increased sob, he did have dizziness during that time. No c/p during that episode as well.  I advised him if he should feel that again to let  us know, he does have pharmacy visit at end of month for f/u and will relay this info at that time.   Marylouise Stacks, [facility] Paramedic  09/04/20

## 2020-09-13 ENCOUNTER — Telehealth (HOSPITAL_COMMUNITY): Payer: Self-pay | Admitting: Pharmacy Technician

## 2020-09-13 NOTE — Telephone Encounter (Signed)
Patient Advocate Encounter   Received notification from [facility] that prior authorization for Tyler Wong is required.   PA submitted on CoverMyMeds Key [id number] Status is pending   Will continue to follow.

## 2020-09-16 NOTE — Telephone Encounter (Signed)
Advanced Heart Failure Patient Advocate Encounter  Prior Authorization for Vania Rea has been approved.    PA# [id number] Effective dates: 09/13/20 through 09/13/21  Charlann Boxer, CPhT

## 2020-09-19 ENCOUNTER — Emergency Department (HOSPITAL_COMMUNITY): Payer: Medicaid Other

## 2020-09-19 ENCOUNTER — Other Ambulatory Visit: Payer: Self-pay

## 2020-09-19 ENCOUNTER — Inpatient Hospital Stay (HOSPITAL_COMMUNITY)
Admission: EM | Admit: 2020-09-19 | Discharge: 2020-09-21 | DRG: 177 | Disposition: A | Discharge status: Home / Self Care | Payer: Medicaid Other | Attending: Internal Medicine | Admitting: Internal Medicine

## 2020-09-19 DIAGNOSIS — I130 Hypertensive heart and chronic kidney disease with heart failure and stage 1 through stage 4 chronic kidney disease, or unspecified chronic kidney disease: Secondary | ICD-10-CM | POA: Diagnosis present

## 2020-09-19 DIAGNOSIS — J9601 Acute respiratory failure with hypoxia: Secondary | ICD-10-CM | POA: Diagnosis present

## 2020-09-19 DIAGNOSIS — R7303 Prediabetes: Secondary | ICD-10-CM | POA: Diagnosis present

## 2020-09-19 DIAGNOSIS — I10 Essential (primary) hypertension: Secondary | ICD-10-CM | POA: Diagnosis not present

## 2020-09-19 DIAGNOSIS — Z7982 Long term (current) use of aspirin: Secondary | ICD-10-CM | POA: Diagnosis not present

## 2020-09-19 DIAGNOSIS — I5042 Chronic combined systolic (congestive) and diastolic (congestive) heart failure: Secondary | ICD-10-CM | POA: Diagnosis present

## 2020-09-19 DIAGNOSIS — J069 Acute upper respiratory infection, unspecified: Secondary | ICD-10-CM | POA: Diagnosis present

## 2020-09-19 DIAGNOSIS — Z8249 Family history of ischemic heart disease and other diseases of the circulatory system: Secondary | ICD-10-CM | POA: Diagnosis not present

## 2020-09-19 DIAGNOSIS — I255 Ischemic cardiomyopathy: Secondary | ICD-10-CM | POA: Diagnosis present

## 2020-09-19 DIAGNOSIS — N183 Chronic kidney disease, stage 3 unspecified: Secondary | ICD-10-CM | POA: Diagnosis present

## 2020-09-19 DIAGNOSIS — Z955 Presence of coronary angioplasty implant and graft: Secondary | ICD-10-CM | POA: Diagnosis not present

## 2020-09-19 DIAGNOSIS — Z209 Contact with and (suspected) exposure to unspecified communicable disease: Secondary | ICD-10-CM | POA: Diagnosis not present

## 2020-09-19 DIAGNOSIS — J449 Chronic obstructive pulmonary disease, unspecified: Secondary | ICD-10-CM | POA: Diagnosis present

## 2020-09-19 DIAGNOSIS — R000 Tachycardia, unspecified: Secondary | ICD-10-CM | POA: Diagnosis not present

## 2020-09-19 DIAGNOSIS — I444 Left anterior fascicular block: Secondary | ICD-10-CM | POA: Diagnosis present

## 2020-09-19 DIAGNOSIS — I248 Other forms of acute ischemic heart disease: Secondary | ICD-10-CM | POA: Diagnosis present

## 2020-09-19 DIAGNOSIS — F17210 Nicotine dependence, cigarettes, uncomplicated: Secondary | ICD-10-CM | POA: Diagnosis present

## 2020-09-19 DIAGNOSIS — I2510 Atherosclerotic heart disease of native coronary artery without angina pectoris: Secondary | ICD-10-CM | POA: Diagnosis not present

## 2020-09-19 DIAGNOSIS — R778 Other specified abnormalities of plasma proteins: Secondary | ICD-10-CM

## 2020-09-19 DIAGNOSIS — Z9861 Coronary angioplasty status: Secondary | ICD-10-CM | POA: Diagnosis not present

## 2020-09-19 DIAGNOSIS — I252 Old myocardial infarction: Secondary | ICD-10-CM

## 2020-09-19 DIAGNOSIS — R0689 Other abnormalities of breathing: Secondary | ICD-10-CM | POA: Diagnosis not present

## 2020-09-19 DIAGNOSIS — U071 COVID-19: Principal | ICD-10-CM | POA: Diagnosis present

## 2020-09-19 DIAGNOSIS — E875 Hyperkalemia: Secondary | ICD-10-CM | POA: Diagnosis not present

## 2020-09-19 DIAGNOSIS — Z79899 Other long term (current) drug therapy: Secondary | ICD-10-CM | POA: Diagnosis not present

## 2020-09-19 DIAGNOSIS — I499 Cardiac arrhythmia, unspecified: Secondary | ICD-10-CM | POA: Diagnosis not present

## 2020-09-19 DIAGNOSIS — I428 Other cardiomyopathies: Secondary | ICD-10-CM | POA: Diagnosis present

## 2020-09-19 DIAGNOSIS — J9600 Acute respiratory failure, unspecified whether with hypoxia or hypercapnia: Secondary | ICD-10-CM | POA: Diagnosis not present

## 2020-09-19 DIAGNOSIS — R0602 Shortness of breath: Secondary | ICD-10-CM | POA: Diagnosis not present

## 2020-09-19 LAB — COMPREHENSIVE METABOLIC PANEL
ALT: 26 U/L (ref 0–44)
AST: 29 U/L (ref 15–41)
Albumin: 2.8 g/dL — ABNORMAL LOW (ref 3.5–5.0)
Alkaline Phosphatase: 78 U/L (ref 38–126)
Anion gap: 12 (ref 5–15)
BUN: 19 mg/dL (ref 6–20)
CO2: 28 mmol/L (ref 22–32)
Calcium: 8.3 mg/dL — ABNORMAL LOW (ref 8.9–10.3)
Chloride: 94 mmol/L — ABNORMAL LOW (ref 98–111)
Creatinine, Ser: 1.58 mg/dL — ABNORMAL HIGH (ref 0.61–1.24)
GFR calc Af Amer: 57 mL/min — ABNORMAL LOW (ref >60)
GFR calc non Af Amer: 49 mL/min — ABNORMAL LOW (ref >60)
Glucose, Bld: 117 mg/dL — ABNORMAL HIGH (ref 70–99)
Potassium: 4.2 mmol/L (ref 3.5–5.1)
Sodium: 134 mmol/L — ABNORMAL LOW (ref 135–145)
Total Bilirubin: 0.9 mg/dL (ref 0.3–1.2)
Total Protein: 6.7 g/dL (ref 6.5–8.1)

## 2020-09-19 LAB — CBC WITH DIFFERENTIAL/PLATELET
Abs Immature Granulocytes: 0.02 10*3/uL (ref 0.00–0.07)
Basophils Absolute: 0 10*3/uL (ref 0.0–0.1)
Basophils Relative: 0 %
Eosinophils Absolute: 0 10*3/uL (ref 0.0–0.5)
Eosinophils Relative: 0 %
HCT: 41.2 % (ref 39.0–52.0)
Hemoglobin: 13.4 g/dL (ref 13.0–17.0)
Immature Granulocytes: 0 %
Lymphocytes Relative: 10 %
Lymphs Abs: 0.5 10*3/uL — ABNORMAL LOW (ref 0.7–4.0)
MCH: 28.2 pg (ref 26.0–34.0)
MCHC: 32.5 g/dL (ref 30.0–36.0)
MCV: 86.7 fL (ref 80.0–100.0)
Monocytes Absolute: 0.7 10*3/uL (ref 0.1–1.0)
Monocytes Relative: 14 %
Neutro Abs: 3.8 10*3/uL (ref 1.7–7.7)
Neutrophils Relative %: 76 %
Platelets: 167 10*3/uL (ref 150–400)
RBC: 4.75 MIL/uL (ref 4.22–5.81)
RDW: 15.6 % — ABNORMAL HIGH (ref 11.5–15.5)
WBC: 5 10*3/uL (ref 4.0–10.5)
nRBC: 0 % (ref 0.0–0.2)

## 2020-09-19 LAB — URINALYSIS, MICROSCOPIC (REFLEX)
Bacteria, UA: NONE SEEN
RBC / HPF: NONE SEEN RBC/hpf (ref 0–5)
WBC, UA: NONE SEEN WBC/hpf (ref 0–5)

## 2020-09-19 LAB — URINALYSIS, ROUTINE W REFLEX MICROSCOPIC
Bilirubin Urine: NEGATIVE
Glucose, UA: >=500 mg/dL — AB
Hgb urine dipstick: NEGATIVE
Ketones, ur: NEGATIVE mg/dL
Leukocytes,Ua: NEGATIVE
Nitrite: NEGATIVE
Protein, ur: NEGATIVE mg/dL
Specific Gravity, Urine: 1.01 (ref 1.005–1.030)
pH: 7 (ref 5.0–8.0)

## 2020-09-19 LAB — BRAIN NATRIURETIC PEPTIDE: B Natriuretic Peptide: 478.3 pg/mL — ABNORMAL HIGH (ref 0.0–100.0)

## 2020-09-19 LAB — I-STAT CHEM 8, ED
BUN: 21 mg/dL — ABNORMAL HIGH (ref 6–20)
Calcium, Ion: 0.97 mmol/L — ABNORMAL LOW (ref 1.15–1.40)
Chloride: 94 mmol/L — ABNORMAL LOW (ref 98–111)
Creatinine, Ser: 1.6 mg/dL — ABNORMAL HIGH (ref 0.61–1.24)
Glucose, Bld: 114 mg/dL — ABNORMAL HIGH (ref 70–99)
HCT: 41 % (ref 39.0–52.0)
Hemoglobin: 13.9 g/dL (ref 13.0–17.0)
Potassium: 3.9 mmol/L (ref 3.5–5.1)
Sodium: 133 mmol/L — ABNORMAL LOW (ref 135–145)
TCO2: 28 mmol/L (ref 22–32)

## 2020-09-19 LAB — SARS CORONAVIRUS 2 BY RT PCR (HOSPITAL ORDER, PERFORMED IN ~~LOC~~ HOSPITAL LAB): SARS Coronavirus 2: POSITIVE — AB

## 2020-09-19 LAB — TRIGLYCERIDES: Triglycerides: 52 mg/dL (ref <150)

## 2020-09-19 LAB — PROCALCITONIN: Procalcitonin: 0.25 ng/mL

## 2020-09-19 LAB — D-DIMER, QUANTITATIVE: D-Dimer, Quant: 0.37 ug/mL-FEU (ref 0.00–0.50)

## 2020-09-19 LAB — FERRITIN: Ferritin: 169 ng/mL (ref 24–336)

## 2020-09-19 LAB — FIBRINOGEN: Fibrinogen: >800 mg/dL — ABNORMAL HIGH (ref 210–475)

## 2020-09-19 LAB — TROPONIN I (HIGH SENSITIVITY): Troponin I (High Sensitivity): 279 ng/L (ref <18)

## 2020-09-19 LAB — LACTIC ACID, PLASMA: Lactic Acid, Venous: 1.1 mmol/L (ref 0.5–1.9)

## 2020-09-19 LAB — C-REACTIVE PROTEIN: CRP: 7.9 mg/dL — ABNORMAL HIGH (ref <1.0)

## 2020-09-19 LAB — LACTATE DEHYDROGENASE: LDH: 186 U/L (ref 98–192)

## 2020-09-19 MED ORDER — DEXAMETHASONE SODIUM PHOSPHATE 10 MG/ML IJ SOLN
6.0000 mg | Freq: Once | INTRAMUSCULAR | Status: AC
  Administered 2020-09-19: 6 mg via INTRAVENOUS
  Filled 2020-09-19: qty 1

## 2020-09-19 MED ORDER — ALBUTEROL SULFATE HFA 108 (90 BASE) MCG/ACT IN AERS
2.0000 | INHALATION_SPRAY | Freq: Once | RESPIRATORY_TRACT | Status: AC
  Administered 2020-09-19: 2 via RESPIRATORY_TRACT
  Filled 2020-09-19: qty 6.7

## 2020-09-19 MED ORDER — SODIUM CHLORIDE 0.9 % IV SOLN
200.0000 mg | Freq: Once | INTRAVENOUS | Status: AC
  Administered 2020-09-20: 200 mg via INTRAVENOUS
  Filled 2020-09-19: qty 40

## 2020-09-19 MED ORDER — ACETAMINOPHEN 325 MG PO TABS
650.0000 mg | ORAL_TABLET | Freq: Once | ORAL | Status: AC
  Administered 2020-09-19: 650 mg via ORAL
  Filled 2020-09-19: qty 2

## 2020-09-19 MED ORDER — SODIUM CHLORIDE 0.9 % IV SOLN
100.0000 mg | Freq: Every day | INTRAVENOUS | Status: DC
  Administered 2020-09-21: 100 mg via INTRAVENOUS
  Filled 2020-09-19: qty 20

## 2020-09-19 NOTE — ED Provider Notes (Addendum)
Medical screening examination/treatment/procedure(s) were conducted as a shared visit with non-physician practitioner(s) and myself.  I personally evaluated the patient during the encounter.  EKG Interpretation  Date/Time:  Thursday September 19 2020 20:31:50 EDT Ventricular Rate:  104 PR Interval:    QRS Duration: 97 QT Interval:  397 QTC Calculation: 523 R Axis:   -71 Text Interpretation: Sinus tachycardia Biatrial enlargement Left anterior fascicular block Left ventricular hypertrophy Anterior infarct, old ST elevation suggests acute pericarditis Prolonged QT interval No significant change since last tracing Confirmed by Wandra Arthurs ([id number]) on 09/19/2020 9:50:50 PM  Tyler Wong is a 54 y.o. male history of CAD with stent, CHF here presenting with shortness of breath.  Patient has been having intermittent shortness of breath that is chronic.  He states that he have gotten worse over the last week or so.  He states that he is busy taking care of his wife who apparently had a fall and he was trying to catch her.  He initially called EMS for his wife but they noticed that he is very tachypneic some to command.  Patient was noted to be febrile and tachycardic and Borderline low blood pressure in triage.  Patient has extensive history of CHF and required admission and has been follow up with the heart failure team.  He states that he is vaccinated against Covid already.  Heis also compliant with his meds.  He has some bibasilar crackles on exam.  He has trace pitting edema which is chronic.  Patient unfortunately has positive Covid.  His inflammatory markers are also elevated.  He does not have any oxygen requirement but is tachypneic to the 20s.  Patient also has elevated troponin to 279.  I think this is demand ischemia secondary to Covid.  I ordered Decadron and remdesivir.  Given his underlying heart problem and elevated troponin, will admit for rule out ACS as well as Covid. Talked to Dr. Hassell Done from  cardiology who recommend continue to trend trop and consult cardiology as needed.     Drenda Freeze, MD 09/19/20 [id number]    Drenda Freeze, MD 09/19/20 [phone]

## 2020-09-19 NOTE — ED Provider Notes (Signed)
[facility] EMERGENCY DEPARTMENT Provider Note   CSN: [id number] Arrival date & time: 09/19/20  2018     History Chief Complaint  Patient presents with  . Shortness of Breath    Tyler Wong is a 54 y.o. male who  has a past medical history of CAD in native artery (07/17/14), CHF (congestive heart failure) ([place]), Cocaine abuse ([place]), COPD (chronic obstructive pulmonary disease) ([place]), Dyspnea, Hypertension, ST elevation myocardial infarction (STEMI) involving left anterior descending (LAD) coronary artery with complication ([place]), and Tobacco abuse (07/17/2014). He presents to the ED with SOB. Patient has had several days of flulike symptoms with fevers, chills, body aches, cough, hoarse voice. He is a daily smoker. Patient states that his wife has had the same symptoms and he actually called EMS for her because she fell at home and was weak and seemed confused. They both had very poor appetite. He states that he became very short of breath because he was walking up the stairs and he said that EMS became concerned with his breathing and made him come to the emergency department. He states that he has been compliant with his medication and that he did get vaccinated for coronavirus. He states" I think I got Pfizer and had 2 doses.". Initial, Not. The lady that I saw earlier was very upset that I did take her urinary urgency very seriously cramping twice daily for 1 week Questran just make. She to see and they believe that by Dr. Venetia Constable  HPI     Past Medical History:  Diagnosis Date  . CAD in native artery 07/17/14   STEMI- LAD stenosis with DES  . CHF (congestive heart failure) ([place])   . Cocaine abuse ([place])   . COPD (chronic obstructive pulmonary disease) ([place])   . Dyspnea   . Hypertension   . ST elevation myocardial infarction (STEMI) involving left anterior descending (LAD) coronary artery with complication ([place])   . Tobacco abuse 07/17/2014    Patient Active Problem  List   Diagnosis Date Noted  . Cardiorenal syndrome 06/29/2020  . 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 Acute on chronic clinical systolic heart failure ([place]) 06/22/2020  . Acute on chronic systolic (congestive) heart failure ([place]) 06/22/2020  . Palliative care encounter   . Cardiogenic shock ([place]) 04/11/2020  . CKD (chronic kidney disease) stage 3, GFR 30-59 ml/min 04/11/2020  . Symptomatic anemia 04/09/2020  . Acute on chronic HFrEF (heart failure with reduced ejection fraction) ([place]) 05/05/2019  . HFrEF (heart failure with reduced ejection fraction) ([place]) 05/04/2019  . Acute combined systolic and diastolic congestive heart failure ([place]) 10/02/2018  . Hepatitis C 04/13/2017  . Current non-adherence to medical treatment 04/13/2017  . Acute on chronic combined systolic (congestive) and diastolic (congestive) heart failure ([place]) 04/13/2017  . HTN (hypertension) 04/12/2017  . Insomnia 01/18/2017  . Chronic combined systolic and diastolic congestive heart failure ([place]) 01/04/2017  . Hyperlipidemia 01/04/2017  . Chest pain 12/30/2016  . CAD S/P percutaneous coronary angioplasty 07/18/2014  . Tobacco abuse 07/17/2014  . Cocaine use 07/17/2014    Past Surgical History:  Procedure Laterality Date  . CORONARY ANGIOPLASTY WITH STENT PLACEMENT  07/18/14   resolute DES to LAD STEMI  . LEFT HEART CATH N/A 07/17/2014   Procedure: LEFT HEART CATH;  Surgeon: [name], MD;  Location: [facility] CATH LAB;  Service: Cardiovascular;  Laterality: N/A;  . PERCUTANEOUS CORONARY STENT INTERVENTION (PCI-S)  07/17/2014   Procedure: PERCUTANEOUS CORONARY STENT INTERVENTION (PCI-S);  Surgeon: [name], MD;  Location: [facility] CATH LAB;  Service: Cardiovascular;;  . RIGHT/LEFT HEART CATH AND CORONARY ANGIOGRAPHY N/A 10/03/2018   Procedure: RIGHT/LEFT HEART CATH AND CORONARY ANGIOGRAPHY;  Surgeon: [name], MD;  Location: [facility] CV LAB;  Service: Cardiovascular;  Laterality: N/A;        Family History  Problem Relation Age of Onset  . Cirrhosis Father   . Heart attack Mother   . Heart attack Sister   . Heart failure Sister   . Heart attack Brother     Social History   Tobacco Use  . Smoking status: Current Every Day Smoker    Packs/day: 0.50    Years: 30.00    Pack years: 15.00    Types: Cigarettes  . Smokeless tobacco: Never Used  . Tobacco comment: .5 PK PER DAY  Vaping Use  . Vaping Use: Never used  Substance Use Topics  . Alcohol use: No    Comment: Patient denies abuse, states social drinker  . Drug use: Yes    Types: Cocaine    Comment: heroin    Home Medications Prior to Admission medications   Medication Sig Start Date End Date Taking? Authorizing Provider  albuterol (VENTOLIN HFA) 108 (90 Base) MCG/ACT inhaler Inhale 2 puffs into the lungs every 4 (four) hours as needed for wheezing or shortness of breath. 01/24/20   [name], NP  aspirin 81 MG EC tablet Take 1 tablet (81 mg total) by mouth daily. 04/25/20   Lyda Jester M, PA-C  atorvastatin (LIPITOR) 80 MG tablet Take 1 tablet (80 mg total) by mouth daily. 04/25/20   Lyda Jester M, PA-C  digoxin (LANOXIN) 0.125 MG tablet Take 1 tablet (0.125 mg total) by mouth daily. 07/11/20   [name], MD  empagliflozin (JARDIANCE) 10 MG TABS tablet Take 1 tablet (10 mg total) by mouth daily before breakfast. 08/27/20   Lyda Jester M, PA-C  isosorbide-hydrALAZINE (BIDIL) 20-37.5 MG tablet Take 1 tablet by mouth 3 (three) times daily. 07/11/20   [name], MD  Multiple Vitamin (MULTIVITAMIN) tablet Take 1 tablet by mouth daily.     [provider]  NITROSTAT 0.4 MG SL tablet PLACE 1 TABLET UNDER TONGUE AS NEEDED FOR CHEST PAIN EVERY 5 MINUTES X 3 MAX DOSES.  CALL 911 IF PAIN PERSISTS Patient not taking: Reported on 07/11/2020 02/10/18   [name], MD  omeprazole (PRILOSEC) 20 MG capsule Take 1 capsule (20 mg total) by mouth daily. 01/24/20   [name], NP  potassium chloride SA (KLOR-CON) 20 MEQ tablet Take 1 tablet (20 mEq total) by mouth daily. 08/29/20   Lyda Jester M, PA-C  torsemide (DEMADEX) 20 MG tablet Take 40 mg daily and additional 20 mg as needed For 3 lb weight gain over night or 5 lb weight gain in a week 08/27/20   Consuelo Pandy, PA-C    Allergies    Patient has no known allergies.  Review of Systems   Review of Systems Ten systems reviewed and are negative for acute change, except as noted in the HPI.   Physical Exam Updated Vital Signs BP 102/80 (BP Location: Right Arm)   Pulse (!) 104   Temp (!) 101.2 F (38.4 C) (Oral)   Resp 14   Ht 5\' 10"  (1.778 m)   Wt 75.8 kg   SpO2 96%   BMI 23.96 kg/m   Physical Exam Vitals and nursing note reviewed.  Constitutional:  General: He is not in acute distress.    Appearance: He is well-developed. He is not diaphoretic.  HENT:     Head: Normocephalic and atraumatic.  Eyes:     General: No scleral icterus.    Conjunctiva/sclera: Conjunctivae normal.  Cardiovascular:     Rate and Rhythm: Normal rate and regular rhythm.     Heart sounds: Normal heart sounds.  Pulmonary:     Effort: Pulmonary effort is normal. Tachypnea present. No respiratory distress.     Breath sounds: Normal breath sounds. No wheezing or rales.  Abdominal:     Palpations: Abdomen is soft.     Tenderness: There is no abdominal tenderness.  Musculoskeletal:     Cervical back: Normal range of motion and neck supple.  Skin:    General: Skin is warm and dry.  Neurological:     Mental Status: He is alert.  Psychiatric:        Behavior: Behavior normal.     ED Results / Procedures / Treatments   Labs (all labs ordered are listed, but only abnormal results are displayed) Labs Reviewed  SARS CORONAVIRUS 2 BY RT PCR (HOSPITAL ORDER, [facility])  CULTURE, BLOOD (ROUTINE X 2)  CULTURE, BLOOD (ROUTINE X 2)  LACTIC ACID, PLASMA  LACTIC ACID,  PLASMA  CBC WITH DIFFERENTIAL/PLATELET  COMPREHENSIVE METABOLIC PANEL  D-DIMER, QUANTITATIVE (NOT AT [facility])  PROCALCITONIN  LACTATE DEHYDROGENASE  FERRITIN  TRIGLYCERIDES  FIBRINOGEN  C-REACTIVE PROTEIN  URINALYSIS, ROUTINE W REFLEX MICROSCOPIC    EKG None  Radiology No results found.  Procedures Procedures (including critical care time)  Medications Ordered in ED Medications  acetaminophen (TYLENOL) tablet 650 mg (has no administration in time range)    ED Course  I have reviewed the triage vital signs and the nursing notes.  Pertinent labs & imaging results that were available during my care of the patient were reviewed by me and considered in my medical decision making (see chart for details).    MDM Rules/Calculators/A&P                          Patient here with cough and sob. Covid POS. Multiple comorbidities. Labs and workup pending. Sign out given to Dr. Hiram Gash was evaluated in Emergency Department on 09/21/2020 for the symptoms described in the history of present illness. He was evaluated in the context of the global COVID-19 pandemic, which necessitated consideration that the patient might be at risk for infection with the SARS-CoV-2 virus that causes COVID-19. Institutional protocols and algorithms that pertain to the evaluation of patients at risk for COVID-19 are in a state of rapid change based on information released by regulatory bodies including the CDC and federal and state organizations. These policies and algorithms were followed during the patient's care in the ED.  Final Clinical Impression(s) / ED Diagnoses Final diagnoses:  None    Rx / DC Orders ED Discharge Orders    None       Margarita Mail, PA-C 09/21/20 [id number]    Drenda Freeze, MD 09/23/20 1504

## 2020-09-19 NOTE — ED Triage Notes (Signed)
Pt arrived from home via ems reporting sob which he reports "its been that way on and off." Pt states he called EMS for his wife but they suggested to him that he shoul; be seen as well.

## 2020-09-20 ENCOUNTER — Encounter (HOSPITAL_COMMUNITY): Payer: Self-pay | Admitting: Internal Medicine

## 2020-09-20 DIAGNOSIS — U071 COVID-19: Principal | ICD-10-CM

## 2020-09-20 DIAGNOSIS — J9600 Acute respiratory failure, unspecified whether with hypoxia or hypercapnia: Secondary | ICD-10-CM

## 2020-09-20 DIAGNOSIS — J069 Acute upper respiratory infection, unspecified: Secondary | ICD-10-CM | POA: Diagnosis present

## 2020-09-20 LAB — RAPID URINE DRUG SCREEN, HOSP PERFORMED
Amphetamines: NOT DETECTED
Barbiturates: NOT DETECTED
Benzodiazepines: NOT DETECTED
Cocaine: NOT DETECTED
Opiates: NOT DETECTED
Tetrahydrocannabinol: NOT DETECTED

## 2020-09-20 LAB — CBC
HCT: 41.8 % (ref 39.0–52.0)
Hemoglobin: 13.6 g/dL (ref 13.0–17.0)
MCH: 28.3 pg (ref 26.0–34.0)
MCHC: 32.5 g/dL (ref 30.0–36.0)
MCV: 86.9 fL (ref 80.0–100.0)
Platelets: 165 10*3/uL (ref 150–400)
RBC: 4.81 MIL/uL (ref 4.22–5.81)
RDW: 15.6 % — ABNORMAL HIGH (ref 11.5–15.5)
WBC: 3.6 10*3/uL — ABNORMAL LOW (ref 4.0–10.5)
nRBC: 0 % (ref 0.0–0.2)

## 2020-09-20 LAB — CREATININE, SERUM
Creatinine, Ser: 1.65 mg/dL — ABNORMAL HIGH (ref 0.61–1.24)
GFR calc Af Amer: 54 mL/min — ABNORMAL LOW (ref >60)
GFR calc non Af Amer: 46 mL/min — ABNORMAL LOW (ref >60)

## 2020-09-20 LAB — TROPONIN I (HIGH SENSITIVITY)
Troponin I (High Sensitivity): 278 ng/L (ref <18)
Troponin I (High Sensitivity): 256 ng/L (ref <18)

## 2020-09-20 MED ORDER — TORSEMIDE 20 MG PO TABS: 40.0000 mg | ORAL_TABLET | Freq: Every day | ORAL | Status: DC

## 2020-09-20 MED ORDER — ISOSORB DINITRATE-HYDRALAZINE 20-37.5 MG PO TABS
1.0000 | ORAL_TABLET | Freq: Three times a day (TID) | ORAL | Status: DC
  Administered 2020-09-20 – 2020-09-21 (×3): 1 via ORAL
  Filled 2020-09-20 (×5): qty 1

## 2020-09-20 MED ORDER — DEXAMETHASONE SODIUM PHOSPHATE 10 MG/ML IJ SOLN
6.0000 mg | Freq: Every day | INTRAMUSCULAR | Status: DC
  Administered 2020-09-20 – 2020-09-21 (×2): 6 mg via INTRAVENOUS
  Filled 2020-09-20 (×2): qty 1

## 2020-09-20 MED ORDER — ATORVASTATIN CALCIUM 80 MG PO TABS
80.0000 mg | ORAL_TABLET | Freq: Every day | ORAL | Status: DC
  Administered 2020-09-20 – 2020-09-21 (×2): 80 mg via ORAL
  Filled 2020-09-20: qty 1
  Filled 2020-09-20: qty 2

## 2020-09-20 MED ORDER — ENOXAPARIN SODIUM 40 MG/0.4ML ~~LOC~~ SOLN
40.0000 mg | SUBCUTANEOUS | Status: DC
  Administered 2020-09-20 – 2020-09-21 (×2): 40 mg via SUBCUTANEOUS
  Filled 2020-09-20 (×2): qty 0.4

## 2020-09-20 MED ORDER — EPINEPHRINE 0.3 MG/0.3ML IJ SOAJ
0.3000 mg | Freq: Once | INTRAMUSCULAR | Status: DC | PRN
  Filled 2020-09-20: qty 0.6

## 2020-09-20 MED ORDER — ALBUTEROL SULFATE HFA 108 (90 BASE) MCG/ACT IN AERS
2.0000 | INHALATION_SPRAY | RESPIRATORY_TRACT | Status: DC | PRN
  Filled 2020-09-20: qty 6.7

## 2020-09-20 MED ORDER — SODIUM CHLORIDE 0.9 % IV SOLN
1200.0000 mg | Freq: Once | INTRAVENOUS | Status: AC
  Administered 2020-09-20: 1200 mg via INTRAVENOUS
  Filled 2020-09-20: qty 10

## 2020-09-20 MED ORDER — POTASSIUM CHLORIDE CRYS ER 20 MEQ PO TBCR
20.0000 meq | EXTENDED_RELEASE_TABLET | Freq: Every day | ORAL | Status: DC
  Administered 2020-09-20 – 2020-09-21 (×2): 20 meq via ORAL
  Filled 2020-09-20 (×2): qty 1

## 2020-09-20 MED ORDER — SODIUM CHLORIDE 0.9 % IV SOLN: INTRAVENOUS | Status: DC | PRN

## 2020-09-20 MED ORDER — DIGOXIN 125 MCG PO TABS
0.1250 mg | ORAL_TABLET | Freq: Every day | ORAL | Status: DC
  Administered 2020-09-20 – 2020-09-21 (×2): 0.125 mg via ORAL
  Filled 2020-09-20 (×2): qty 1

## 2020-09-20 MED ORDER — ASCORBIC ACID 500 MG PO TABS
500.0000 mg | ORAL_TABLET | Freq: Every day | ORAL | Status: DC
  Administered 2020-09-20 – 2020-09-21 (×2): 500 mg via ORAL
  Filled 2020-09-20 (×2): qty 1

## 2020-09-20 MED ORDER — METHYLPREDNISOLONE SODIUM SUCC 125 MG IJ SOLR
125.0000 mg | Freq: Once | INTRAMUSCULAR | Status: AC | PRN
  Administered 2020-09-20: 125 mg via INTRAVENOUS
  Filled 2020-09-20: qty 2

## 2020-09-20 MED ORDER — DIPHENHYDRAMINE HCL 50 MG/ML IJ SOLN: 50.0000 mg | Freq: Once | INTRAMUSCULAR | Status: DC | PRN

## 2020-09-20 MED ORDER — CHLORHEXIDINE GLUCONATE CLOTH 2 % EX PADS
6.0000 | MEDICATED_PAD | Freq: Every day | CUTANEOUS | Status: DC
  Administered 2020-09-21: 6 via TOPICAL

## 2020-09-20 MED ORDER — ASPIRIN EC 81 MG PO TBEC
81.0000 mg | DELAYED_RELEASE_TABLET | Freq: Every day | ORAL | Status: DC
  Administered 2020-09-20 – 2020-09-21 (×2): 81 mg via ORAL
  Filled 2020-09-20 (×2): qty 1

## 2020-09-20 MED ORDER — ACETAMINOPHEN 650 MG RE SUPP
650.0000 mg | Freq: Four times a day (QID) | RECTAL | Status: DC | PRN
  Filled 2020-09-20: qty 1

## 2020-09-20 MED ORDER — ALBUTEROL SULFATE HFA 108 (90 BASE) MCG/ACT IN AERS
2.0000 | INHALATION_SPRAY | Freq: Once | RESPIRATORY_TRACT | Status: DC | PRN
  Filled 2020-09-20: qty 6.7

## 2020-09-20 MED ORDER — FAMOTIDINE IN NACL 20-0.9 MG/50ML-% IV SOLN
20.0000 mg | Freq: Once | INTRAVENOUS | Status: DC | PRN
  Filled 2020-09-20: qty 50

## 2020-09-20 MED ORDER — ZINC SULFATE 220 (50 ZN) MG PO CAPS
220.0000 mg | ORAL_CAPSULE | Freq: Every day | ORAL | Status: DC
  Administered 2020-09-20 – 2020-09-21 (×2): 220 mg via ORAL
  Filled 2020-09-20 (×2): qty 1

## 2020-09-20 MED ORDER — PANTOPRAZOLE SODIUM 40 MG PO TBEC
40.0000 mg | DELAYED_RELEASE_TABLET | Freq: Every day | ORAL | Status: DC
  Administered 2020-09-20 – 2020-09-21 (×2): 40 mg via ORAL
  Filled 2020-09-20 (×2): qty 1

## 2020-09-20 MED ORDER — ACETAMINOPHEN 325 MG PO TABS
650.0000 mg | ORAL_TABLET | Freq: Four times a day (QID) | ORAL | Status: DC | PRN
  Administered 2020-09-20: 650 mg via ORAL
  Filled 2020-09-20: qty 2

## 2020-09-20 MED ORDER — EMPAGLIFLOZIN 10 MG PO TABS
10.0000 mg | ORAL_TABLET | Freq: Every day | ORAL | Status: DC
  Administered 2020-09-20 – 2020-09-21 (×2): 10 mg via ORAL
  Filled 2020-09-20 (×2): qty 1

## 2020-09-20 NOTE — Progress Notes (Signed)
RN noted patient hear rate in the 200 and rounded on patient.  He was in bed and stated that he walked to the window.  He denied any palpitation, chest pain, dizziness, or shortness of breath.  Vital sign was taken and respiration was in the 30's which raised his MEWS score to yellow.  He is not in any acute distress.  MEWS score is discussed with charge RN and vital sign protocol implemented.  Patient is in bed resting.  Will continue to monitor.

## 2020-09-20 NOTE — Progress Notes (Signed)
BP is 94/76.  Dr. Myna Hidalgo text paged and he called back with verbal instruction to hold tonight dose of BIDIL.  Patient is resting.  Will continue to monitor.

## 2020-09-20 NOTE — ED Notes (Signed)
Report attempted. No answer with swot.

## 2020-09-20 NOTE — ED Notes (Signed)
Pt given information sheets and wishes to proceed with administration

## 2020-09-20 NOTE — Progress Notes (Signed)
PROGRESS NOTE                                                                                                                                                                                                             Patient Demographics:    Tyler Wong, is a 54 y.o. male, DOB - 1966-04-15, GMW:[id number]  Admit date - 09/19/2020   Admitting Physician Rise Patience, MD  Outpatient Primary MD for the patient is [name], NP  LOS - 1   Chief Complaint  Patient presents with  . Shortness of Breath       Brief Narrative    No charge note as patient was seen and admitted earlier today by Dr. Hal Hope, chart, imaging and labs were reviewed  HPI: Tyler Wong is a 54 y.o. male with history of CAD status post stenting, cardiomyopathy but ischemic and nonischemic with last EF measured in April 2021 was less than 20% with grade 2 diastolic dysfunction, history of cocaine abuse patient states he has not had any cocaine for more than a month was brought to the ER after patient started developing shortness of breath.  Originally EMS was called for shortness of breath for his wife who happened to be having COVID-19 infection.  When EMS arrived patient was exerting himself and started feeling short of breath and became hypoxic and was brought to the ER.  Patient states he is chronically short of breath but acutely got worsened when EMS came.  Not having any chest pain.  Has some nonproductive cough.  ED Course: In the ER patient was febrile with temperature 102 F chest x-ray showing nothing acute EKG showing sinus tachycardia.  Patient had a high sensitive troponin of 279 cardiology recommended trending the troponins.  Patient turned out to be positive for Covid infection CRP was 7.9 D-dimer was normal.  Given the comorbidities patient was started on remdesivir and steroids admitted for Covid infection and also to trend cardiac markers.     Subjective:    Tyler Wong today has, No headache, No chest pain, No abdominal pain, does report generalized weakness, fatigue, some dyspnea.    Assessment  & Plan :    Principal Problem:   Acute respiratory failure due to COVID-19 [facility]) Active Problems:   CAD S/P percutaneous coronary angioplasty   Chronic combined systolic  and diastolic congestive heart failure ([place])   Acute respiratory disease due to COVID-19 virus   COVID-19 infection  -Patient reports he is vaccinated with Pfizer vaccine few month ago, cannot recall exact date, but he knows it was in her state of pharmacy. -Patient currently with no hypoxia, no evidence of COVID-19 pneumonia on imaging. -I have discussed with patient about monoclonal antibody, given his significant cardiac history he qualifies, he is agreeable, he received monoclonal antibody 9/24. -Continue with steroids and Remdesivir.  Elevated troponin with history of CAD status post stenting denies any chest pain.  Cardiology was notified recommended trending cardiac markers.  Patient is on aspirin and statins.  History of cardiomyopathy denies any shortness of breath at this time.  Will trend cardiac markers patient's BiDil is on hold due to low normal blood pressure.  Patient is on torsemide digoxin.  Chronic kidney disease stage III creatinine appears to be at baseline.  Post to monitor.  If there is any further worsening of creatinine may have to hold digoxin.    COVID-19 Labs  Recent Labs    09/19/20 2058  DDIMER 0.37  FERRITIN 169  LDH 186  CRP 7.9*    Lab Results  Component Value Date   SARSCOV2NAA POSITIVE (A) 09/19/2020   SARSCOV2NAA NEGATIVE 06/22/2020   SARSCOV2NAA NEGATIVE 04/09/2020   [name] NEGATIVE 05/04/2019     Code Status : FULL  Disposition Plan  :  Status is: Inpatient  Remains inpatient appropriate because:IV treatments appropriate due to intensity of illness or inability to take PO   Dispo: The patient is  from: Home              Anticipated d/c is to: Home              Anticipated d/c date is: 2 days              Patient currently is not medically stable to d/c.        Barriers For Discharge :   Consults  :  NONE  Procedures  : none   DVT Prophylaxis  :  [place] lovenox  Lab Results  Component Value Date   PLT 165 09/20/2020    Antibiotics  :   Anti-infectives (From admission, onward)   Start     Dose/Rate Route Frequency Ordered Stop   09/21/20 1000  remdesivir 100 mg in sodium chloride 0.9 % 100 mL IVPB       "Followed by" Linked Group Details   100 mg 200 mL/hr over 30 Minutes Intravenous Daily 09/19/20 2327 09/25/20 0959   09/20/20 0100  remdesivir 200 mg in sodium chloride 0.9% 250 mL IVPB       "Followed by" Linked Group Details   200 mg 580 mL/hr over 30 Minutes Intravenous Once 09/19/20 2327 09/20/20 0207        Objective:   Vitals:   09/20/20 1227 09/20/20 1305 09/20/20 1307 09/20/20 1445  BP:   102/80 (!) 82/59  Pulse:   94 93  Resp:   (!) 28 (!) 21  Temp: 97.9 F (36.6 C) 97.9 F (36.6 C)  98 F (36.7 C)  TempSrc: Oral Oral  Oral  SpO2:   97% 95%  Weight:      Height:        Wt Readings from Last 3 Encounters:  09/19/20 75.8 kg  08/27/20 75.3 kg  06/29/20 74.7 kg     Intake/Output Summary (Last 24 hours) at 09/20/2020 1608 Last  data filed at 09/20/2020 1538 Gross per 24 hour  Intake 250 ml  Output 1525 ml  Net -1275 ml     Physical Exam  Awake Alert, Oriented X 3, No new F.N deficits, Normal affect Symmetrical Chest wall movement, Good air movement bilaterally, CTAB RRR,No Gallops,Rubs or new Murmurs, No Parasternal Heave +ve B.Sounds, Abd Soft, No tenderness,No rebound - guarding or rigidity. No Cyanosis, Clubbing or edema, No new Rash or bruise      Data Review:    CBC Recent Labs  Lab 09/19/20 2058 09/19/20 2237 09/20/20 0705  WBC 5.0  --  3.6*  HGB 13.4 13.9 13.6  HCT 41.2 41.0 41.8  PLT 167  --  165  MCV 86.7  --   86.9  MCH 28.2  --  28.3  MCHC 32.5  --  32.5  RDW 15.6*  --  15.6*  LYMPHSABS 0.5*  --   --   MONOABS 0.7  --   --   EOSABS 0.0  --   --   BASOSABS 0.0  --   --     Chemistries  Recent Labs  Lab 09/19/20 2058 09/19/20 2237 09/20/20 0125  NA 134* 133*  --   K 4.2 3.9  --   CL 94* 94*  --   CO2 28  --   --   GLUCOSE 117* 114*  --   BUN 19 21*  --   CREATININE 1.58* 1.60* 1.65*  CALCIUM 8.3*  --   --   AST 29  --   --   ALT 26  --   --   ALKPHOS 78  --   --   BILITOT 0.9  --   --    ------------------------------------------------------------------------------------------------------------------ Recent Labs    09/19/20 2058  TRIG 52    Lab Results  Component Value Date   HGBA1C 6.5 (H) 04/12/2020   ------------------------------------------------------------------------------------------------------------------ No results for input(s): TSH, T4TOTAL, T3FREE, THYROIDAB in the last 72 hours.  Invalid input(s): FREET3 ------------------------------------------------------------------------------------------------------------------ Recent Labs    09/19/20 2058  FERRITIN 169    Coagulation profile No results for input(s): INR, PROTIME in the last 168 hours.  Recent Labs    09/19/20 2058  DDIMER 0.37    Cardiac Enzymes No results for input(s): CKMB, TROPONINI, MYOGLOBIN in the last 168 hours.  Invalid input(s): CK ------------------------------------------------------------------------------------------------------------------    Component Value Date/Time   BNP 478.3 (H) 09/19/2020 2058    Inpatient Medications  Scheduled Meds: . vitamin C  500 mg Oral Daily  . aspirin EC  81 mg Oral Daily  . atorvastatin  80 mg Oral Daily  . Chlorhexidine Gluconate Cloth  6 each Topical Daily  . dexamethasone (DECADRON) injection  6 mg Intravenous Q0600  . digoxin  0.125 mg Oral Daily  . empagliflozin  10 mg Oral QAC breakfast  . enoxaparin (LOVENOX) injection   40 mg Subcutaneous Q24H  . isosorbide-hydrALAZINE  1 tablet Oral TID  . pantoprazole  40 mg Oral Daily  . potassium chloride SA  20 mEq Oral Daily  . zinc sulfate  220 mg Oral Daily   Continuous Infusions: . sodium chloride    . famotidine (PEPCID) IV    . [START ON 09/21/2020] remdesivir 100 mg in NS 100 mL     PRN Meds:.sodium chloride, acetaminophen **OR** acetaminophen, albuterol, albuterol, diphenhydrAMINE, EPINEPHrine, famotidine (PEPCID) IV  Micro Results Recent Results (from the past 240 hour(s))  SARS Coronavirus 2 by RT PCR (hospital order, performed in [facility] hospital lab) Nasopharyngeal Nasopharyngeal Swab     Status: Abnormal   Collection Time: 09/19/20  8:58 PM   Specimen: Nasopharyngeal Swab  Result Value Ref Range Status   SARS Coronavirus 2 POSITIVE (A) NEGATIVE Final    Comment: RESULT CALLED TO, READ BACK BY AND VERIFIED WITH: A COLEMAN RN 09/19/20 2218 JDW (NOTE) SARS-CoV-2 target nucleic acids are DETECTED  SARS-CoV-2 RNA is generally detectable in upper respiratory specimens  during the acute phase of infection.  Positive results are indicative  of the presence of the identified virus, but do not rule out bacterial infection or co-infection with other pathogens not detected by the test.  Clinical correlation with patient history and  other diagnostic information is necessary to determine patient infection status.  The expected result is negative.  Fact Sheet for Patients:   StrictlyIdeas.no   Fact Sheet for Healthcare Providers:   BankingDealers.co.za    This test is not yet approved or cleared by the [place] FDA and  has been authorized for detection and/or diagnosis of SARS-CoV-2 by FDA under an Emergency Use Authorization (EUA).  This EUA will remain in effect (meaning this test ca n be used) for the duration of  the COVID-19 declaration under Section 564(b)(1) of the Act, 21 U.S.C. section  360-bbb-3(b)(1), unless the authorization is terminated or revoked sooner.  Performed at [facility], [place] [address] [place], [place] [address]     Radiology Reports DG Chest [place] 1 View  Result Date: 09/19/2020 CLINICAL DATA:  Shortness of breath EXAM: PORTABLE CHEST 1 VIEW COMPARISON:  Chest x-ray 06/21/2020, CT chest 04/09/2020 FINDINGS: The heart size and mediastinal contours are unchanged. Similar-appearing cardiomegaly. Coronary artery calcifications. No focal consolidation. No pulmonary edema. No pleural effusion. No pneumothorax. No acute osseous abnormality. IMPRESSION: No active cardiopulmonary disease. Electronically Signed   By: Iven Finn M.D.   On: 09/19/2020 21:11      Phillips Climes M.D on 09/20/2020 at 4:08 PM    [facility] -  Office  [phone]

## 2020-09-20 NOTE — ED Notes (Signed)
Report attempted 

## 2020-09-20 NOTE — H&P (Signed)
History and Physical    Tyler Wong NIO:[id number] DOB: 1966/10/15 DOA: 09/19/2020  PCP: [name], NP  Patient coming from: Home.  Chief Complaint: Shortness of breath.  HPI: Tyler Wong is a 54 y.o. male with history of CAD status post stenting, cardiomyopathy but ischemic and nonischemic with last EF measured in April 2021 was less than 20% with grade 2 diastolic dysfunction, history of cocaine abuse patient states he has not had any cocaine for more than a month was brought to the ER after patient started developing shortness of breath.  Originally EMS was called for shortness of breath for his wife who happened to be having COVID-19 infection.  When EMS arrived patient was exerting himself and started feeling short of breath and became hypoxic and was brought to the ER.  Patient states he is chronically short of breath but acutely got worsened when EMS came.  Not having any chest pain.  Has some nonproductive cough.  ED Course: In the ER patient was febrile with temperature 102 F chest x-ray showing nothing acute EKG showing sinus tachycardia.  Patient had a high sensitive troponin of 279 cardiology recommended trending the troponins.  Patient turned out to be positive for Covid infection CRP was 7.9 D-dimer was normal.  Given the comorbidities patient was started on remdesivir and steroids admitted for Covid infection and also to trend cardiac markers.  Review of Systems: As per HPI, rest all negative.   Past Medical History:  Diagnosis Date  . CAD in native artery 07/17/14   STEMI- LAD stenosis with DES  . CHF (congestive heart failure) ([place])   . Cocaine abuse ([place])   . COPD (chronic obstructive pulmonary disease) ([place])   . Dyspnea   . Hypertension   . ST elevation myocardial infarction (STEMI) involving left anterior descending (LAD) coronary artery with complication ([place])   . Tobacco abuse 07/17/2014    Past Surgical History:  Procedure Laterality Date  . CORONARY  ANGIOPLASTY WITH STENT PLACEMENT  07/18/14   resolute DES to LAD STEMI  . LEFT HEART CATH N/A 07/17/2014   Procedure: LEFT HEART CATH;  Surgeon: [name], MD;  Location: [facility] CATH LAB;  Service: Cardiovascular;  Laterality: N/A;  . PERCUTANEOUS CORONARY STENT INTERVENTION (PCI-S)  07/17/2014   Procedure: PERCUTANEOUS CORONARY STENT INTERVENTION (PCI-S);  Surgeon: [name], MD;  Location: [facility] CATH LAB;  Service: Cardiovascular;;  . RIGHT/LEFT HEART CATH AND CORONARY ANGIOGRAPHY N/A 10/03/2018   Procedure: RIGHT/LEFT HEART CATH AND CORONARY ANGIOGRAPHY;  Surgeon: [name], MD;  Location: [facility] CV LAB;  Service: Cardiovascular;  Laterality: N/A;     reports that he has been smoking cigarettes. He has a 15.00 pack-year smoking history. He has never used smokeless tobacco. He reports current drug use. Drug: Cocaine. He reports that he does not drink alcohol.  No Known Allergies  Family History  Problem Relation Age of Onset  . Cirrhosis Father   . Heart attack Mother   . Heart attack Sister   . Heart failure Sister   . Heart attack Brother     Prior to Admission medications   Medication Sig Start Date End Date Taking? Authorizing Provider  albuterol (VENTOLIN HFA) 108 (90 Base) MCG/ACT inhaler Inhale 2 puffs into the lungs every 4 (four) hours as needed for wheezing or shortness of breath. 01/24/20  Yes [name], NP  aspirin 81 MG EC tablet Take 1 tablet (81 mg total) by mouth daily. 04/25/20  Yes Lyda Jester M, PA-C  atorvastatin (LIPITOR) 80 MG tablet Take 1 tablet (80 mg total) by mouth daily. 04/25/20  Yes Lyda Jester M, PA-C  digoxin (LANOXIN) 0.125 MG tablet Take 1 tablet (0.125 mg total) by mouth daily. 07/11/20  Yes [name], MD  empagliflozin (JARDIANCE) 10 MG TABS tablet Take 1 tablet (10 mg total) by mouth daily before breakfast. 08/27/20  Yes Rosita Fire, Brittainy M, PA-C  isosorbide-hydrALAZINE (BIDIL) 20-37.5 MG tablet Take 1 tablet  by mouth 3 (three) times daily. 07/11/20  Yes [name], MD  NITROSTAT 0.4 MG SL tablet PLACE 1 TABLET UNDER TONGUE AS NEEDED FOR CHEST PAIN EVERY 5 MINUTES X 3 MAX DOSES.  CALL 911 IF PAIN PERSISTS Patient taking differently: Place 0.4 mg under the tongue every 5 (five) minutes x 3 doses as needed for chest pain.  02/10/18  Yes [name], MD  potassium chloride SA (KLOR-CON) 20 MEQ tablet Take 1 tablet (20 mEq total) by mouth daily. 08/29/20  Yes Rosita Fire, Brittainy M, PA-C  torsemide (DEMADEX) 20 MG tablet Take 40 mg daily and additional 20 mg as needed For 3 lb weight gain over night or 5 lb weight gain in a week Patient taking differently: Take 20-40 mg by mouth See admin instructions. Take 40 mg by mouth daily and an additional 20 mg as needed for a 3-lbs weight gain overnight or 5 lbs in a week 08/27/20  Yes Lyda Jester M, PA-C  Multiple Vitamin (MULTIVITAMIN) tablet Take 1 tablet by mouth daily.     [provider]  omeprazole (PRILOSEC) 20 MG capsule Take 1 capsule (20 mg total) by mouth daily. 01/24/20   [name], NP    Physical Exam: Constitutional: Moderately built and nourished. Vitals:   09/19/20 2252 09/19/20 2326 09/19/20 2330 09/20/20 0030  BP: 103/90  (!) 105/55 94/64  Pulse: (!) 104 (!) 102 (!) 46 97  Resp: (!) 29 (!) 22 (!) 23 19  Temp:      TempSrc:      SpO2: 96% 95% 94% 95%  Weight:      Height:       Eyes: Anicteric no pallor. ENMT: No discharge from the ears eyes nose or mouth. Neck: No mass felt.  No neck rigidity. Respiratory: No rhonchi or crepitations. Cardiovascular: S1-S2 heard. Abdomen: Soft nontender bowel sounds present. Musculoskeletal: No edema. Skin: No rash. Neurologic: Alert awake oriented to time place and person.  Moves all extremities. Psychiatric: Appears normal.  Normal affect.   Labs on Admission: I have personally reviewed following labs and imaging studies  CBC: Recent Labs  Lab 09/19/20 2058  09/19/20 2237  WBC 5.0  --   NEUTROABS 3.8  --   HGB 13.4 13.9  HCT 41.2 41.0  MCV 86.7  --   PLT 167  --    Basic Metabolic Panel: Recent Labs  Lab 09/19/20 2058 09/19/20 2237  NA 134* 133*  K 4.2 3.9  CL 94* 94*  CO2 28  --   GLUCOSE 117* 114*  BUN 19 21*  CREATININE 1.58* 1.60*  CALCIUM 8.3*  --    GFR: Estimated Creatinine Clearance: 54.5 mL/min (A) (by C-G formula based on SCr of 1.6 mg/dL (H)). Liver Function Tests: Recent Labs  Lab 09/19/20 2058  AST 29  ALT 26  ALKPHOS 78  BILITOT 0.9  PROT 6.7  ALBUMIN 2.8*   No results for input(s): LIPASE, AMYLASE in the last 168 hours. No results for  input(s): AMMONIA in the last 168 hours. Coagulation Profile: No results for input(s): INR, PROTIME in the last 168 hours. Cardiac Enzymes: No results for input(s): CKTOTAL, CKMB, CKMBINDEX, TROPONINI in the last 168 hours. BNP (last 3 results) Recent Labs    01/30/20 1645  PROBNP 1,759*   HbA1C: No results for input(s): HGBA1C in the last 72 hours. CBG: No results for input(s): GLUCAP in the last 168 hours. Lipid Profile: Recent Labs    09/19/20 2058  TRIG 52   Thyroid Function Tests: No results for input(s): TSH, T4TOTAL, FREET4, T3FREE, THYROIDAB in the last 72 hours. Anemia Panel: Recent Labs    09/19/20 2058  FERRITIN 169   Urine analysis:    Component Value Date/Time   COLORURINE YELLOW 09/19/2020 2247   APPEARANCEUR CLEAR 09/19/2020 2247   LABSPEC 1.010 09/19/2020 2247   PHURINE 7.0 09/19/2020 2247   GLUCOSEU >=500 (A) 09/19/2020 2247   HGBUR NEGATIVE 09/19/2020 2247   [place] NEGATIVE 09/19/2020 2247   [place] 09/19/2020 2247   PROTEINUR NEGATIVE 09/19/2020 2247   NITRITE NEGATIVE 09/19/2020 2247   LEUKOCYTESUR NEGATIVE 09/19/2020 2247   Sepsis Labs: @LABRCNTIP (procalcitonin:4,lacticidven:4) ) Recent Results (from the past 240 hour(s))  SARS Coronavirus 2 by RT PCR (hospital order, performed in [place] hospital  lab) Nasopharyngeal Nasopharyngeal Swab     Status: Abnormal   Collection Time: 09/19/20  8:58 PM   Specimen: Nasopharyngeal Swab  Result Value Ref Range Status   SARS Coronavirus 2 POSITIVE (A) NEGATIVE Final    Comment: RESULT CALLED TO, READ BACK BY AND VERIFIED WITH: A COLEMAN RN 09/19/20 2218 JDW (NOTE) SARS-CoV-2 target nucleic acids are DETECTED  SARS-CoV-2 RNA is generally detectable in upper respiratory specimens  during the acute phase of infection.  Positive results are indicative  of the presence of the identified virus, but do not rule out bacterial infection or co-infection with other pathogens not detected by the test.  Clinical correlation with patient history and  other diagnostic information is necessary to determine patient infection status.  The expected result is negative.  Fact Sheet for Patients:   StrictlyIdeas.no   Fact Sheet for Healthcare Providers:   BankingDealers.co.za    This test is not yet approved or cleared by the [place] FDA and  has been authorized for detection and/or diagnosis of SARS-CoV-2 by FDA under an Emergency Use Authorization (EUA).  This EUA will remain in effect (meaning this test ca n be used) for the duration of  the COVID-19 declaration under Section 564(b)(1) of the Act, 21 U.S.C. section 360-bbb-3(b)(1), unless the authorization is terminated or revoked sooner.  Performed at [facility], [place] [address] [place], [place] [address]      Radiological Exams on Admission: DG Chest [place] 1 View  Result Date: 09/19/2020 CLINICAL DATA:  Shortness of breath EXAM: PORTABLE CHEST 1 VIEW COMPARISON:  Chest x-ray 06/21/2020, CT chest 04/09/2020 FINDINGS: The heart size and mediastinal contours are unchanged. Similar-appearing cardiomegaly. Coronary artery calcifications. No focal consolidation. No pulmonary edema. No pleural effusion. No pneumothorax. No acute osseous abnormality.  IMPRESSION: No active cardiopulmonary disease. Electronically Signed   By: Iven Finn M.D.   On: 09/19/2020 21:11    EKG: Independently reviewed.  Sinus tachycardia.  Assessment/Plan Principal Problem:   Acute respiratory failure due to COVID-19 [facility]) Active Problems:   CAD S/P percutaneous coronary angioplasty   Chronic combined systolic and diastolic congestive heart failure ([facility])   Acute respiratory disease due to COVID-19 virus  1. Acute respiratory failure secondary to COVID-19 infection patient is not requiring oxygen but appears mildly short of breath but is definitely febrile.  With elevated CRP levels and multiple comorbidities patient has been started on remdesivir and the steroids.  Closely monitor respiratory status and inflammatory markers. 2. Elevated troponin with history of CAD status post stenting denies any chest pain.  Cardiology was notified recommended trending cardiac markers.  Patient is on aspirin and statins. 3. History of cardiomyopathy denies any shortness of breath at this time.  Will trend cardiac markers patient's BiDil is on hold due to low normal blood pressure.  Patient is on torsemide digoxin. 4. Chronic kidney disease stage III creatinine appears to be at baseline.  Post to monitor.  If there is any further worsening of creatinine may have to hold digoxin.  Since patient has Covid infection with fever and shortness of breath will need close monitoring for any further worsening in inpatient status.   DVT prophylaxis: Lovenox. Code Status: Full code. Family Communication: Discussed with patient's wife. Disposition Plan: Home. Consults called: Cardiology. Admission status: Inpatient.   Rise Patience MD [facility] Pager [phone].  If 7PM-7AM, please contact night-coverage [email or web address] Password [email or web address]  09/20/2020, 1:49 AM

## 2020-09-21 DIAGNOSIS — Z9861 Coronary angioplasty status: Secondary | ICD-10-CM

## 2020-09-21 DIAGNOSIS — I2510 Atherosclerotic heart disease of native coronary artery without angina pectoris: Secondary | ICD-10-CM

## 2020-09-21 LAB — COMPREHENSIVE METABOLIC PANEL
ALT: 26 U/L (ref 0–44)
AST: 28 U/L (ref 15–41)
Albumin: 2.6 g/dL — ABNORMAL LOW (ref 3.5–5.0)
Alkaline Phosphatase: 72 U/L (ref 38–126)
Anion gap: 13 (ref 5–15)
BUN: 31 mg/dL — ABNORMAL HIGH (ref 6–20)
CO2: 26 mmol/L (ref 22–32)
Calcium: 8.6 mg/dL — ABNORMAL LOW (ref 8.9–10.3)
Chloride: 94 mmol/L — ABNORMAL LOW (ref 98–111)
Creatinine, Ser: 1.61 mg/dL — ABNORMAL HIGH (ref 0.61–1.24)
GFR calc Af Amer: 55 mL/min — ABNORMAL LOW (ref >60)
GFR calc non Af Amer: 48 mL/min — ABNORMAL LOW (ref >60)
Glucose, Bld: 182 mg/dL — ABNORMAL HIGH (ref 70–99)
Potassium: 4.3 mmol/L (ref 3.5–5.1)
Sodium: 133 mmol/L — ABNORMAL LOW (ref 135–145)
Total Bilirubin: 0.7 mg/dL (ref 0.3–1.2)
Total Protein: 7 g/dL (ref 6.5–8.1)

## 2020-09-21 LAB — CBC WITH DIFFERENTIAL/PLATELET
Abs Immature Granulocytes: 0.04 10*3/uL (ref 0.00–0.07)
Basophils Absolute: 0 10*3/uL (ref 0.0–0.1)
Basophils Relative: 0 %
Eosinophils Absolute: 0 10*3/uL (ref 0.0–0.5)
Eosinophils Relative: 0 %
HCT: 41.3 % (ref 39.0–52.0)
Hemoglobin: 14 g/dL (ref 13.0–17.0)
Immature Granulocytes: 0 %
Lymphocytes Relative: 5 %
Lymphs Abs: 0.5 10*3/uL — ABNORMAL LOW (ref 0.7–4.0)
MCH: 28.9 pg (ref 26.0–34.0)
MCHC: 33.9 g/dL (ref 30.0–36.0)
MCV: 85.2 fL (ref 80.0–100.0)
Monocytes Absolute: 0.4 10*3/uL (ref 0.1–1.0)
Monocytes Relative: 4 %
Neutro Abs: 8.2 10*3/uL — ABNORMAL HIGH (ref 1.7–7.7)
Neutrophils Relative %: 91 %
Platelets: 209 10*3/uL (ref 150–400)
RBC: 4.85 MIL/uL (ref 4.22–5.81)
RDW: 15.6 % — ABNORMAL HIGH (ref 11.5–15.5)
WBC: 9.1 10*3/uL (ref 4.0–10.5)
nRBC: 0 % (ref 0.0–0.2)

## 2020-09-21 LAB — D-DIMER, QUANTITATIVE: D-Dimer, Quant: 0.34 ug/mL-FEU (ref 0.00–0.50)

## 2020-09-21 LAB — BRAIN NATRIURETIC PEPTIDE: B Natriuretic Peptide: 578.1 pg/mL — ABNORMAL HIGH (ref 0.0–100.0)

## 2020-09-21 LAB — C-REACTIVE PROTEIN: CRP: 6 mg/dL — ABNORMAL HIGH (ref <1.0)

## 2020-09-21 MED ORDER — ISOSORB DINITRATE-HYDRALAZINE 20-37.5 MG PO TABS: 1.0000 | ORAL_TABLET | Freq: Three times a day (TID) | ORAL | 6 refills | Status: DC

## 2020-09-21 MED ORDER — DEXAMETHASONE 6 MG PO TABS: 6.0000 mg | ORAL_TABLET | Freq: Every day | ORAL | 0 refills | Status: DC

## 2020-09-21 NOTE — Discharge Instructions (Signed)
Patient scheduled for outpatient Remdesivir infusions at 10am on Sunday 9/26, Monday 9/27, and Tuesday 9/28 at [facility]. Please inform the patient to park at [place], as staff will be escorting the patient through the [place] entrance of the hospital. Appointments take approximately 45 minutes.    There is a wave flag banner located near the entrance on [address]. Turn into this entrance and immediately turn left and park in 1 of the 5 designated Covid Infusion Parking spots. There is a phone number on the sign, please call and let the staff know what spot you are in and we will come out and get you. For questions call [phone].  Thanks.       Person Under Monitoring Name: Tyler Wong  Location: [place] [address]   Infection Prevention Recommendations for Individuals Confirmed to have, or Being Evaluated for, 2019 Novel Coronavirus (COVID-19) Infection Who Receive Care at Home  Individuals who are confirmed to have, or are being evaluated for, COVID-19 should follow the prevention steps below until a healthcare provider or local or state health department says they can return to normal activities.  Stay home except to get medical care You should restrict activities outside your home, except for getting medical care. Do not go to work, school, or public areas, and do not use public transportation or taxis.  Call ahead before visiting your doctor Before your medical appointment, call the healthcare provider and tell them that you have, or are being evaluated for, COVID-19 infection. This will help the healthcare provider's office take steps to keep other people from getting infected. Ask your healthcare provider to call the local or state health department.  Monitor your symptoms Seek prompt medical attention if your illness is worsening (e.g., difficulty breathing). Before going to your medical appointment, call the  healthcare provider and tell them that you have, or are being evaluated for, COVID-19 infection. Ask your healthcare provider to call the local or state health department.  Wear a facemask You should wear a facemask that covers your nose and mouth when you are in the same room with other people and when you visit a healthcare provider. People who live with or visit you should also wear a facemask while they are in the same room with you.  Separate yourself from other people in your home As much as possible, you should stay in a different room from other people in your home. Also, you should use a separate bathroom, if available.  Avoid sharing household items You should not share dishes, drinking glasses, cups, eating utensils, towels, bedding, or other items with other people in your home. After using these items, you should wash them thoroughly with soap and water.  Cover your coughs and sneezes Cover your mouth and nose with a tissue when you cough or sneeze, or you can cough or sneeze into your sleeve. Throw used tissues in a lined trash can, and immediately wash your hands with soap and water for at least 20 seconds or use an alcohol-based hand rub.  Wash your [facility] your hands often and thoroughly with soap and water for at least 20 seconds. You can use an alcohol-based hand sanitizer if soap and water are not available and if your hands are not visibly dirty. Avoid touching your eyes, nose, and mouth with unwashed hands.   Prevention Steps for Caregivers and Household Members of Individuals Confirmed to have, or Being Evaluated  for, COVID-19 Infection Being Cared for in the Home  If you live with, or provide care at home for, a person confirmed to have, or being evaluated for, COVID-19 infection please follow these guidelines to prevent infection:  Follow healthcare provider's instructions Make sure that you understand and can help the patient follow any healthcare  provider instructions for all care.  Provide for the patient's basic needs You should help the patient with basic needs in the home and provide support for getting groceries, prescriptions, and other personal needs.  Monitor the patient's symptoms If they are getting sicker, call his or her medical provider and tell them that the patient has, or is being evaluated for, COVID-19 infection. This will help the healthcare provider's office take steps to keep other people from getting infected. Ask the healthcare provider to call the local or state health department.  Limit the number of people who have contact with the patient  If possible, have only one caregiver for the patient.  Other household members should stay in another home or place of residence. If this is not possible, they should stay  in another room, or be separated from the patient as much as possible. Use a separate bathroom, if available.  Restrict visitors who do not have an essential need to be in the home.  Keep older adults, very young children, and other sick people away from the patient Keep older adults, very young children, and those who have compromised immune systems or chronic health conditions away from the patient. This includes people with chronic heart, lung, or kidney conditions, diabetes, and cancer.  Ensure good ventilation Make sure that shared spaces in the home have good air flow, such as from an air conditioner or an opened window, weather permitting.  Wash your hands often  Wash your hands often and thoroughly with soap and water for at least 20 seconds. You can use an alcohol based hand sanitizer if soap and water are not available and if your hands are not visibly dirty.  Avoid touching your eyes, nose, and mouth with unwashed hands.  Use disposable paper towels to dry your hands. If not available, use dedicated cloth towels and replace them when they become wet.  Wear a facemask and  gloves  Wear a disposable facemask at all times in the room and gloves when you touch or have contact with the patient's blood, body fluids, and/or secretions or excretions, such as sweat, saliva, sputum, nasal mucus, vomit, urine, or feces.  Ensure the mask fits over your nose and mouth tightly, and do not touch it during use.  Throw out disposable facemasks and gloves after using them. Do not reuse.  Wash your hands immediately after removing your facemask and gloves.  If your personal clothing becomes contaminated, carefully remove clothing and launder. Wash your hands after handling contaminated clothing.  Place all used disposable facemasks, gloves, and other waste in a lined container before disposing them with other household waste.  Remove gloves and wash your hands immediately after handling these items.  Do not share dishes, glasses, or other household items with the patient  Avoid sharing household items. You should not share dishes, drinking glasses, cups, eating utensils, towels, bedding, or other items with a patient who is confirmed to have, or being evaluated for, COVID-19 infection.  After the person uses these items, you should wash them thoroughly with soap and water.  Wash laundry thoroughly  Immediately remove and wash clothes or bedding that have  blood, body fluids, and/or secretions or excretions, such as sweat, saliva, sputum, nasal mucus, vomit, urine, or feces, on them.  Wear gloves when handling laundry from the patient.  Read and follow directions on labels of laundry or clothing items and detergent. In general, wash and dry with the warmest temperatures recommended on the label.  Clean all areas the individual has used often  Clean all touchable surfaces, such as counters, tabletops, doorknobs, bathroom fixtures, toilets, phones, keyboards, tablets, and bedside tables, every day. Also, clean any surfaces that may have blood, body fluids, and/or secretions or  excretions on them.  Wear gloves when cleaning surfaces the patient has come in contact with.  Use a diluted bleach solution (e.g., dilute bleach with 1 part bleach and 10 parts water) or a household disinfectant with a label that says EPA-registered for coronaviruses. To make a bleach solution at home, add 1 tablespoon of bleach to 1 quart (4 cups) of water. For a larger supply, add  cup of bleach to 1 gallon (16 cups) of water.  Read labels of cleaning products and follow recommendations provided on product labels. Labels contain instructions for safe and effective use of the cleaning product including precautions you should take when applying the product, such as wearing gloves or eye protection and making sure you have good ventilation during use of the product.  Remove gloves and wash hands immediately after cleaning.  Monitor yourself for signs and symptoms of illness Caregivers and household members are considered close contacts, should monitor their health, and will be asked to limit movement outside of the home to the extent possible. Follow the monitoring steps for close contacts listed on the symptom monitoring form.   ? If you have additional questions, contact your local health department or call the epidemiologist on call at [phone] (available 24/7). ? This guidance is subject to change. For the most up-to-date guidance from [facility], please refer to their website: [email or web address]

## 2020-09-21 NOTE — Progress Notes (Signed)
Tyler Wong to be D/C'd home per MD order. Discussed with the patient and all questions fully answered.   VVS, Skin clean, dry and intact without evidence of skin break down, no evidence of skin tears noted.  IV catheter discontinued intact. Site without signs and symptoms of complications. Dressing and pressure applied.  An After Visit Summary was printed and given to the patient.  Patient escorted via [place], and D/C home via private auto.  Tyler Wong  09/21/2020 4:51 PM

## 2020-09-21 NOTE — Plan of Care (Signed)
  Problem: Education: Goal: Knowledge of General Education information will improve Description: Including pain rating scale, medication(s)/side effects and non-pharmacologic comfort measures Outcome: Progressing   Problem: Clinical Measurements: Goal: Ability to maintain clinical measurements within normal limits will improve Outcome: Progressing   Problem: Coping: Goal: Level of anxiety will decrease Outcome: Progressing   

## 2020-09-21 NOTE — Progress Notes (Signed)
Patient scheduled for outpatient Remdesivir infusions at 10am on Sunday 9/26, Monday 9/27, and Tuesday 9/28 at [facility]. Please inform the patient to park at [place], as staff will be escorting the patient through the [place] entrance of the hospital. Appointments take approximately 45 minutes.    There is a wave flag banner located near the entrance on [address]. Turn into this entrance and immediately turn left and park in 1 of the 5 designated Covid Infusion Parking spots. There is a phone number on the sign, please call and let the staff know what spot you are in and we will come out and get you. For questions call [phone].  Thanks.

## 2020-09-21 NOTE — Discharge Summary (Signed)
Tyler Wong, is a 54 y.o. male  DOB 02/22/1966  MRN [id number].  Admission date:  09/19/2020  Admitting Physician  Rise Patience, MD  Discharge Date:  09/21/2020   Primary MD  [name], NP  Recommendations for primary care physician for things to follow:  -Please check CBC, CMP during next visit. -Patient instructed to resume BiDil in 3 days given soft blood pressure during hospital stay.   Admission Diagnosis  Elevated troponin [R77.8] Acute respiratory failure due to COVID-19 (HCC) [U07.1, J96.00] Acute respiratory disease due to COVID-19 virus [U07.1, J06.9] COVID-19 [U07.1]   Discharge Diagnosis  Elevated troponin [R77.8] Acute respiratory failure due to COVID-19 (HCC) [U07.1, J96.00] Acute respiratory disease due to COVID-19 virus [U07.1, J06.9] COVID-19 [U07.1]    Principal Problem:   Acute respiratory failure due to COVID-19 [facility]) Active Problems:   CAD S/P percutaneous coronary angioplasty   Chronic combined systolic and diastolic congestive heart failure ([place])   Acute respiratory disease due to COVID-19 virus      Past Medical History:  Diagnosis Date  . CAD in native artery 07/17/14   STEMI- LAD stenosis with DES  . CHF (congestive heart failure) ([place])   . Cocaine abuse ([place])   . COPD (chronic obstructive pulmonary disease) ([place])   . Dyspnea   . Hypertension   . ST elevation myocardial infarction (STEMI) involving left anterior descending (LAD) coronary artery with complication ([place])   . Tobacco abuse 07/17/2014    Past Surgical History:  Procedure Laterality Date  . CORONARY ANGIOPLASTY WITH STENT PLACEMENT  07/18/14   resolute DES to LAD STEMI  . LEFT HEART CATH N/A 07/17/2014   Procedure: LEFT HEART CATH;  Surgeon: [name], MD;  Location: [facility] CATH LAB;  Service: Cardiovascular;  Laterality: N/A;  . PERCUTANEOUS CORONARY STENT INTERVENTION (PCI-S)   07/17/2014   Procedure: PERCUTANEOUS CORONARY STENT INTERVENTION (PCI-S);  Surgeon: [name], MD;  Location: [facility] CATH LAB;  Service: Cardiovascular;;  . RIGHT/LEFT HEART CATH AND CORONARY ANGIOGRAPHY N/A 10/03/2018   Procedure: RIGHT/LEFT HEART CATH AND CORONARY ANGIOGRAPHY;  Surgeon: [name], MD;  Location: [facility] CV LAB;  Service: Cardiovascular;  Laterality: N/A;       History of present illness and  Hospital Course:     Kindly see H&P for history of present illness and admission details, please review complete Labs, Consult reports and Test reports for all details in brief  HPI  from the history and physical done on the day of admission 09/19/2020   HPI: Tyler Wong is a 54 y.o. male with history of CAD status post stenting, cardiomyopathy but ischemic and nonischemic with last EF measured in April 2021 was less than 20% with grade 2 diastolic dysfunction, history of cocaine abuse patient states he has not had any cocaine for more than a month was brought to the ER after patient started developing shortness of breath.  Originally EMS was called for shortness of breath for his wife who happened to be having  COVID-19 infection.  When EMS arrived patient was exerting himself and started feeling short of breath and became hypoxic and was brought to the ER.  Patient states he is chronically short of breath but acutely got worsened when EMS came.  Not having any chest pain.  Has some nonproductive cough.  ED Course: In the ER patient was febrile with temperature 102 F chest x-ray showing nothing acute EKG showing sinus tachycardia.  Patient had a high sensitive troponin of 279 cardiology recommended trending the troponins.  Patient turned out to be positive for Covid infection CRP was 7.9 D-dimer was normal.  Given the comorbidities patient was started on remdesivir and steroids admitted for Covid infection and also to trend cardiac markers.  Hospital Course   COVID-19  infection  -Patient reports he is vaccinated with Pfizer vaccine few month ago, cannot recall exact date, but he knows it was at [employer]. -Patient currently with no hypoxia, no evidence of COVID-19 pneumonia on imaging which is reassuring, but given his history of severe cardiomyopathy with EF less than 20% he was admitted for further management.  He was treated with monoclonal antibody which he tolerated very well, he is treated with steroids and Remdesivir, today he is with no hypoxia, no dyspnea, so he will be discharged home to finish his remdesivir as an outpatient, and Decadron p.o. regimen for total of 10 days course..   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 -Continue with digoxin  Chronic kidney disease stage III creatinine appears to be at baseline.   Prediabetes -A1c of 6.3, resume Jardiance on discharge.    Discharge Condition:  stable     Discharge Instructions  and  Discharge Medications    Discharge Instructions    Discharge instructions   Complete by: As directed    Follow with Primary MD [name], NP in 10 days   Get CBC, CMP,  checked  by Primary MD next visit.    Activity: As tolerated with Full fall precautions use walker/cane & assistance as needed   Disposition Home    Diet: Heart Healthy , low-salt diet, carb modified, with feeding assistance and aspiration precautions.  For Heart failure patients - Check your Weight same time  everyday, if you gain over 2 pounds, or you develop in leg swelling, experience more shortness of breath or chest pain, call your Primary MD immediately. Follow Cardiac Low Salt Diet and 1.5 lit/day fluid restriction.   On your next visit with your primary care physician please Get Medicines reviewed and adjusted.   Please request your Prim.MD to go over all Hospital Tests and Procedure/Radiological results at the follow up, please get all Hospital records sent to your Prim MD by signing hospital release before you go home.   If you experience worsening of your admission symptoms, develop shortness of breath, life threatening emergency, suicidal or homicidal thoughts you must seek medical attention immediately by calling 911 or calling your MD immediately  if symptoms less severe.  You Must read complete instructions/literature along with all the possible adverse reactions/side effects for all the Medicines you take and that have been prescribed to you. Take any new Medicines after you have completely understood and accpet all the possible adverse reactions/side effects.   Do not drive, operating heavy machinery, perform activities at heights, swimming or participation in water activities or provide baby sitting services if your were admitted for syncope or siezures until you have seen by Primary MD or a Neurologist and advised to do so again.  Do not drive when taking Pain medications.    Do not take more than prescribed Pain, Sleep and Anxiety Medications  Special Instructions: If you have smoked or chewed Tobacco  in the last 2 yrs please stop smoking, stop any regular Alcohol  and or any Recreational drug use.  Wear Seat belts while driving.   Please note  You were cared for by a hospitalist during your hospital stay. If you have any questions about your discharge medications or the care you received while you were in the hospital after you are discharged, you can call the unit and  asked to speak with the hospitalist on call if the hospitalist that took care of you is not available. Once you are discharged, your primary care physician will handle any further medical issues. Please note that NO REFILLS for any discharge medications will be authorized once you are discharged, as it is imperative that you return to your primary care physician (or establish a relationship with a primary care physician if you do not have one) for your aftercare needs so that they can reassess your need for medications and monitor your lab values.   Increase activity slowly   Complete by: As directed      Allergies as of 09/21/2020   No Known Allergies     Medication List    TAKE these medications   albuterol 108 (90 Base) MCG/ACT inhaler Commonly known as: VENTOLIN HFA Inhale 2 puffs into the lungs every 4 (four) hours as needed for wheezing or shortness of breath.   aspirin 81 MG EC tablet Take 1 tablet (81 mg total) by mouth daily.   atorvastatin 80 MG tablet Commonly known as: LIPITOR Take 1 tablet (80 mg total) by mouth daily.   dexamethasone 6 MG tablet Commonly known as: DECADRON Take 1 tablet (6 mg total) by mouth daily. Start taking on: September 22, 2020   digoxin 0.125 MG tablet Commonly known as: LANOXIN Take 1 tablet (0.125 mg total) by mouth daily.   empagliflozin 10 MG Tabs tablet Commonly known as: Jardiance Take 1 tablet (10 mg total) by mouth daily before breakfast.   isosorbide-hydrALAZINE 20-37.5 MG tablet Commonly known as: BIDIL Take 1 tablet by mouth 3 (three) times daily. Start taking on: September 24, 2020 What changed: These instructions start on September 24, 2020. If you are unsure what to do until then, ask your doctor or other care provider.   multivitamin tablet Take 1 tablet by mouth daily.   Nitrostat 0.4 MG SL tablet Generic drug: nitroGLYCERIN PLACE 1 TABLET UNDER TONGUE AS NEEDED FOR CHEST PAIN EVERY 5 MINUTES X 3 MAX DOSES.  CALL 911  IF PAIN PERSISTS What changed: See the new instructions.   omeprazole 20 MG capsule Commonly known as: PRILOSEC Take 1 capsule (20 mg total) by mouth daily.   potassium chloride SA 20 MEQ tablet Commonly known as: KLOR-CON Take 1 tablet (20 mEq total) by mouth daily.  torsemide 20 MG tablet Commonly known as: DEMADEX Take 40 mg daily and additional 20 mg as needed For 3 lb weight gain over night or 5 lb weight gain in a week What changed:   how much to take  how to take this  when to take this  additional instructions         Diet and Activity recommendation: See Discharge Instructions above   Consults obtained -  none   Major procedures and Radiology Reports - PLEASE review detailed and final reports for all details, in brief -     DG Chest Port 1 View  Result Date: 09/19/2020 CLINICAL DATA:  Shortness of breath EXAM: PORTABLE CHEST 1 VIEW COMPARISON:  Chest x-ray 06/21/2020, CT chest 04/09/2020 FINDINGS: The heart size and mediastinal contours are unchanged. Similar-appearing cardiomegaly. Coronary artery calcifications. No focal consolidation. No pulmonary edema. No pleural effusion. No pneumothorax. No acute osseous abnormality. IMPRESSION: No active cardiopulmonary disease. Electronically Signed   By: Iven Finn M.D.   On: 09/19/2020 21:11    Micro Results    Recent Results (from the past 240 hour(s))  SARS Coronavirus 2 by RT PCR (hospital order, performed in [facility] hospital lab) Nasopharyngeal Nasopharyngeal Swab     Status: Abnormal   Collection Time: 09/19/20  8:58 PM   Specimen: Nasopharyngeal Swab  Result Value Ref Range Status   SARS Coronavirus 2 POSITIVE (A) NEGATIVE Final    Comment: RESULT CALLED TO, READ BACK BY AND VERIFIED WITH: A COLEMAN RN 09/19/20 2218 JDW (NOTE) SARS-CoV-2 target nucleic acids are DETECTED  SARS-CoV-2 RNA is generally detectable in upper respiratory specimens  during the acute phase of infection.  Positive  results are indicative  of the presence of the identified virus, but do not rule out bacterial infection or co-infection with other pathogens not detected by the test.  Clinical correlation with patient history and  other diagnostic information is necessary to determine patient infection status.  The expected result is negative.  Fact Sheet for Patients:   StrictlyIdeas.no   Fact Sheet for Healthcare Providers:   BankingDealers.co.za    This test is not yet approved or cleared by the [place] FDA and  has been authorized for detection and/or diagnosis of SARS-CoV-2 by FDA under an Emergency Use Authorization (EUA).  This EUA will remain in effect (meaning this test ca n be used) for the duration of  the COVID-19 declaration under Section 564(b)(1) of the Act, 21 U.S.C. section 360-bbb-3(b)(1), unless the authorization is terminated or revoked sooner.  Performed at [facility], [place] [address] [place], [place] [address]        Today   Subjective:   Tyler Wong today has no headache,no chest abdominal pain,no new weakness tingling or numbness, feels much better wants to go home today.   Objective:   Blood pressure 114/76, pulse 95, temperature 97.6 F (36.4 C), temperature source Axillary, resp. rate 20, height 5\' 10"  (1.778 m), weight 74.3 kg, SpO2 95 %.   Intake/Output Summary (Last 24 hours) at 09/21/2020 1240 Last data filed at 09/21/2020 1036 Gross per 24 hour  Intake 1260 ml  Output 5000 ml  Net -3740 ml    Exam Awake Alert, Oriented x 3, No new F.N deficits, Normal affect Symmetrical Chest wall movement, Good air movement bilaterally, CTAB RRR,No Gallops,Rubs or new Murmurs, No Parasternal Heave +ve B.Sounds, Abd Soft, Non tende, No rebound -guarding or rigidity. No Cyanosis, Clubbing or edema, No new Rash or  bruise  Data Review   CBC w Diff:  Lab Results  Component Value Date   WBC 9.1  09/21/2020   HGB 14.0 09/21/2020   HGB 14.1 01/30/2020   HCT 41.3 09/21/2020   HCT 45.5 01/30/2020   PLT 209 09/21/2020   PLT 302 01/30/2020   LYMPHOPCT 5 09/21/2020   MONOPCT 4 09/21/2020   EOSPCT 0 09/21/2020   BASOPCT 0 09/21/2020    CMP:  Lab Results  Component Value Date   NA 133 (L) 09/21/2020   NA 141 01/30/2020   K 4.3 09/21/2020   CL 94 (L) 09/21/2020   CO2 26 09/21/2020   BUN 31 (H) 09/21/2020   BUN 23 01/30/2020   CREATININE 1.61 (H) 09/21/2020   PROT 7.0 09/21/2020   PROT 7.1 01/30/2020   ALBUMIN 2.6 (L) 09/21/2020   ALBUMIN 4.1 01/30/2020   BILITOT 0.7 09/21/2020   BILITOT 0.6 01/30/2020   ALKPHOS 72 09/21/2020   AST 28 09/21/2020   ALT 26 09/21/2020  .   Total Time in preparing paper work, data evaluation and todays exam - 75 minutes  Phillips Climes M.D on 09/21/2020 at 12:40 PM  [facility]   Office  [phone]

## 2020-09-22 ENCOUNTER — Ambulatory Visit (HOSPITAL_COMMUNITY)
Admit: 2020-09-22 | Discharge: 2020-09-22 | Disposition: A | Discharge status: Home / Self Care | Payer: Medicaid Other | Attending: Pulmonary Disease | Admitting: Pulmonary Disease

## 2020-09-22 DIAGNOSIS — U071 COVID-19: Secondary | ICD-10-CM | POA: Insufficient documentation

## 2020-09-22 MED ORDER — SODIUM CHLORIDE 0.9 % IV SOLN: INTRAVENOUS | Status: DC | PRN

## 2020-09-22 MED ORDER — DIPHENHYDRAMINE HCL 50 MG/ML IJ SOLN: 50.0000 mg | Freq: Once | INTRAMUSCULAR | Status: DC | PRN

## 2020-09-22 MED ORDER — FAMOTIDINE IN NACL 20-0.9 MG/50ML-% IV SOLN: 20.0000 mg | Freq: Once | INTRAVENOUS | Status: DC | PRN

## 2020-09-22 MED ORDER — EPINEPHRINE 0.3 MG/0.3ML IJ SOAJ: 0.3000 mg | Freq: Once | INTRAMUSCULAR | Status: DC | PRN

## 2020-09-22 MED ORDER — SODIUM CHLORIDE 0.9 % IV SOLN
100.0000 mg | Freq: Once | INTRAVENOUS | Status: AC
  Administered 2020-09-22: 100 mg via INTRAVENOUS
  Filled 2020-09-22: qty 20

## 2020-09-22 MED ORDER — METHYLPREDNISOLONE SODIUM SUCC 125 MG IJ SOLR: 125.0000 mg | Freq: Once | INTRAMUSCULAR | Status: DC | PRN

## 2020-09-22 MED ORDER — ALBUTEROL SULFATE HFA 108 (90 BASE) MCG/ACT IN AERS: 2.0000 | INHALATION_SPRAY | Freq: Once | RESPIRATORY_TRACT | Status: DC | PRN

## 2020-09-22 NOTE — Discharge Instructions (Signed)
10 Things You Can Do to Manage Your COVID-19 Symptoms at Home If you have possible or confirmed COVID-19: 1. Stay home from work and school. And stay away from other public places. If you must go out, avoid using any kind of public transportation, ridesharing, or taxis. 2. Monitor your symptoms carefully. If your symptoms get worse, call your healthcare provider immediately. 3. Get rest and stay hydrated. 4. If you have a medical appointment, call the healthcare provider ahead of time and tell them that you have or may have COVID-19. 5. For medical emergencies, call 911 and notify the dispatch personnel that you have or may have COVID-19. 6. Cover your cough and sneezes with a tissue or use the inside of your elbow. 7. Wash your hands often with soap and water for at least 20 seconds or clean your hands with an alcohol-based hand sanitizer that contains at least 60% alcohol. 8. As much as possible, stay in a specific room and away from other people in your home. Also, you should use a separate bathroom, if available. If you need to be around other people in or outside of the home, wear a mask. 9. Avoid sharing personal items with other people in your household, like dishes, towels, and bedding. 10. Clean all surfaces that are touched often, like counters, tabletops, and doorknobs. Use household cleaning sprays or wipes according to the label instructions. cdc.gov/coronavirus 06/28/2019 This information is not intended to replace advice given to you by your health care provider. Make sure you discuss any questions you have with your health care provider. Document Revised: 11/30/2019 Document Reviewed: 11/30/2019 Elsevier Patient Education  2020 Elsevier Inc.  

## 2020-09-22 NOTE — Progress Notes (Signed)
  Diagnosis: COVID-19  Physician: Dr. Wright  Procedure: [facility] Infusion Clinic Med: remdesivir infusion - Provided patient with remdesivir fact sheet for patients, parents and caregivers prior to infusion.  Complications: No immediate complications noted.  Discharge: Discharged home   Kainat Pizana R Braylynn Ghan 09/22/2020   

## 2020-09-23 ENCOUNTER — Telehealth: Payer: Self-pay

## 2020-09-23 ENCOUNTER — Ambulatory Visit (HOSPITAL_COMMUNITY)
Admission: RE | Admit: 2020-09-23 | Discharge: 2020-09-23 | Disposition: A | Discharge status: Home / Self Care | Payer: Medicaid Other | Source: Ambulatory Visit | Attending: Pulmonary Disease | Admitting: Pulmonary Disease

## 2020-09-23 DIAGNOSIS — U071 COVID-19: Secondary | ICD-10-CM | POA: Insufficient documentation

## 2020-09-23 MED ORDER — METHYLPREDNISOLONE SODIUM SUCC 125 MG IJ SOLR: 125.0000 mg | Freq: Once | INTRAMUSCULAR | Status: DC | PRN

## 2020-09-23 MED ORDER — DIPHENHYDRAMINE HCL 50 MG/ML IJ SOLN: 50.0000 mg | Freq: Once | INTRAMUSCULAR | Status: DC | PRN

## 2020-09-23 MED ORDER — SODIUM CHLORIDE 0.9 % IV SOLN: INTRAVENOUS | Status: DC | PRN

## 2020-09-23 MED ORDER — FAMOTIDINE IN NACL 20-0.9 MG/50ML-% IV SOLN: 20.0000 mg | Freq: Once | INTRAVENOUS | Status: DC | PRN

## 2020-09-23 MED ORDER — EPINEPHRINE 0.3 MG/0.3ML IJ SOAJ: 0.3000 mg | Freq: Once | INTRAMUSCULAR | Status: DC | PRN

## 2020-09-23 MED ORDER — SODIUM CHLORIDE 0.9 % IV SOLN
100.0000 mg | Freq: Once | INTRAVENOUS | Status: AC
  Administered 2020-09-23: 100 mg via INTRAVENOUS
  Filled 2020-09-23: qty 20

## 2020-09-23 MED ORDER — ALBUTEROL SULFATE HFA 108 (90 BASE) MCG/ACT IN AERS: 2.0000 | INHALATION_SPRAY | Freq: Once | RESPIRATORY_TRACT | Status: DC | PRN

## 2020-09-23 NOTE — Telephone Encounter (Signed)
Transition Care Management Follow-up Telephone Call  Date of discharge and from where: 09/21/2020, [facility]   How have you been since you were released from the hospital? He stated he is " okay, feeling better."  Any questions or concerns?  none at this time  Items Reviewed:  Did the pt receive and understand the discharge instructions provided?  yes  Medications obtained and verified?  he said he has all medications including the decadron and did not have any questions about his med regime.  He stated that he understood the instruction of when to start taking the bidil   Any new allergies since your discharge?  none reported   Do you have support at home?  he lives alone but said he has people who can assist him as needed.   He understands he is to remain quarantined but no one told him specifically how long he needs to remain quarantined.  The recommendation has been for 3 week and he tested COVID + on 09/19/2020.   He receives services from Marylouise Stacks, EMT [employer] program.  He said that he calls her when needed.  He stated that he has a scale and keeps a log of his daily weights.   No home health or DME ordered  He said that he is adhering to a 2L/day fluid restriction yet his AVS stated 1.5/L/day   Functional Questionnaire: (I = Independent and D = Dependent) ADLs: independent  Follow up appointments reviewed:   PCP Hospital f/u appt confirmed? Juluis Mire, NP 10/03/2020 - virtual/tele  [facility] f/u appt confirmed?remdesivir infusion tomorrow  - 09/24/2020  Are transportation arrangements needed?  no  If their condition worsens, is the pt aware to call PCP or go to the Emergency Dept.?  yes  Was the patient provided with contact information for the PCP's office or ED?  the phone  Number for [facility] was text to him as requested  Was to pt encouraged to call back with questions or concerns? yes

## 2020-09-23 NOTE — Telephone Encounter (Signed)
Transition Care Management Follow-up Telephone Call  Date of discharge and from where: 09/21/2020 from [facility]  How have you been since you were released from the hospital? Patient states that he feels about the same.   Any questions or concerns? No  Items Reviewed:  Did the pt receive and understand the discharge instructions provided? Yes   Medications obtained and verified? Yes   Any new allergies since your discharge? Yes   Dietary orders reviewed? Yes  Do you have support at home? Yes   Functional Questionnaire: (I = Independent and D = Dependent) ADLs: I Bathing/Dressing- I Meal Prep- I Eating- I Maintaining continence- I Transferring/Ambulation- I Managing Meds- I  Follow up appointments reviewed:   PCP Hospital f/u appt confirmed? No  Patient does not want to schedule an appointment at this time. Patient states that he has an appointment for an infusion and will wait til after that is completed.   Are transportation arrangements needed? No   If their condition worsens, is the pt aware to call PCP or go to the Emergency Dept.? Yes  Was the patient provided with contact information for the PCP's office or ED? Yes  Was to pt encouraged to call back with questions or concerns? Yes

## 2020-09-23 NOTE — Progress Notes (Signed)
  Diagnosis: COVID-19  Physician: Joya Gaskins    Procedure: Covid Infusion Clinic Med: remdesivir infusion - Provided patient with remdesivir fact sheet for patients, parents and caregivers prior to infusion.  Complications: No immediate complications noted.  Discharge: Discharged home   Tyler Wong 09/23/2020

## 2020-09-23 NOTE — Discharge Instructions (Signed)
10 Things You Can Do to Manage Your COVID-19 Symptoms at Home If you have possible or confirmed COVID-19: 1. Stay home from work and school. And stay away from other public places. If you must go out, avoid using any kind of public transportation, ridesharing, or taxis. 2. Monitor your symptoms carefully. If your symptoms get worse, call your healthcare provider immediately. 3. Get rest and stay hydrated. 4. If you have a medical appointment, call the healthcare provider ahead of time and tell them that you have or may have COVID-19. 5. For medical emergencies, call 911 and notify the dispatch personnel that you have or may have COVID-19. 6. Cover your cough and sneezes with a tissue or use the inside of your elbow. 7. Wash your hands often with soap and water for at least 20 seconds or clean your hands with an alcohol-based hand sanitizer that contains at least 60% alcohol. 8. As much as possible, stay in a specific room and away from other people in your home. Also, you should use a separate bathroom, if available. If you need to be around other people in or outside of the home, wear a mask. 9. Avoid sharing personal items with other people in your household, like dishes, towels, and bedding. 10. Clean all surfaces that are touched often, like counters, tabletops, and doorknobs. Use household cleaning sprays or wipes according to the label instructions. cdc.gov/coronavirus 06/28/2019 This information is not intended to replace advice given to you by your health care provider. Make sure you discuss any questions you have with your health care provider. Document Revised: 11/30/2019 Document Reviewed: 11/30/2019 Elsevier Patient Education  2020 Elsevier Inc.  

## 2020-09-24 ENCOUNTER — Telehealth (HOSPITAL_COMMUNITY): Payer: Self-pay | Admitting: Licensed Clinical Social Worker

## 2020-09-24 ENCOUNTER — Ambulatory Visit (HOSPITAL_COMMUNITY): Payer: Medicaid Other | Attending: Pulmonary Disease

## 2020-09-24 LAB — CULTURE, BLOOD (ROUTINE X 2)
Culture: NO GROWTH
Culture: NO GROWTH

## 2020-09-24 NOTE — Telephone Encounter (Signed)
CSW called pt to inform he would be unable to come to his pharmacy appt tomorrow as he was just in the hospital with positive COVID test.  Phone went straight to VM- left message to inform.  [name], LCSW Clinical Social Worker [facility] Desk#: [phone] Cell#: [phone]

## 2020-09-25 ENCOUNTER — Inpatient Hospital Stay (HOSPITAL_COMMUNITY): Admission: RE | Admit: 2020-09-25 | Payer: Medicaid Other | Source: Ambulatory Visit

## 2020-10-03 ENCOUNTER — Encounter (HOSPITAL_COMMUNITY): Payer: Self-pay | Admitting: Emergency Medicine

## 2020-10-03 ENCOUNTER — Inpatient Hospital Stay (HOSPITAL_COMMUNITY)
Admission: EM | Admit: 2020-10-03 | Discharge: 2020-10-09 | DRG: 291 | Disposition: A | Discharge status: Home / Self Care | Payer: Medicaid Other | Attending: Cardiovascular Disease | Admitting: Cardiovascular Disease

## 2020-10-03 ENCOUNTER — Emergency Department: Payer: Self-pay

## 2020-10-03 ENCOUNTER — Other Ambulatory Visit: Payer: Self-pay

## 2020-10-03 ENCOUNTER — Emergency Department (HOSPITAL_COMMUNITY): Payer: Medicaid Other

## 2020-10-03 ENCOUNTER — Telehealth (INDEPENDENT_AMBULATORY_CARE_PROVIDER_SITE_OTHER): Payer: Medicaid Other | Admitting: Primary Care

## 2020-10-03 DIAGNOSIS — I5023 Acute on chronic systolic (congestive) heart failure: Secondary | ICD-10-CM | POA: Diagnosis present

## 2020-10-03 DIAGNOSIS — R509 Fever, unspecified: Secondary | ICD-10-CM

## 2020-10-03 DIAGNOSIS — Z452 Encounter for adjustment and management of vascular access device: Secondary | ICD-10-CM

## 2020-10-03 DIAGNOSIS — Z79899 Other long term (current) drug therapy: Secondary | ICD-10-CM

## 2020-10-03 DIAGNOSIS — I5084 End stage heart failure: Secondary | ICD-10-CM | POA: Diagnosis present

## 2020-10-03 DIAGNOSIS — U071 COVID-19: Secondary | ICD-10-CM | POA: Diagnosis not present

## 2020-10-03 DIAGNOSIS — K5900 Constipation, unspecified: Secondary | ICD-10-CM | POA: Diagnosis present

## 2020-10-03 DIAGNOSIS — J449 Chronic obstructive pulmonary disease, unspecified: Secondary | ICD-10-CM | POA: Diagnosis present

## 2020-10-03 DIAGNOSIS — Z9119 Patient's noncompliance with other medical treatment and regimen: Secondary | ICD-10-CM

## 2020-10-03 DIAGNOSIS — F418 Other specified anxiety disorders: Secondary | ICD-10-CM | POA: Diagnosis not present

## 2020-10-03 DIAGNOSIS — I110 Hypertensive heart disease with heart failure: Secondary | ICD-10-CM | POA: Diagnosis not present

## 2020-10-03 DIAGNOSIS — N1831 Chronic kidney disease, stage 3a: Secondary | ICD-10-CM | POA: Diagnosis present

## 2020-10-03 DIAGNOSIS — Y92230 Patient room in hospital as the place of occurrence of the external cause: Secondary | ICD-10-CM | POA: Diagnosis not present

## 2020-10-03 DIAGNOSIS — Z7982 Long term (current) use of aspirin: Secondary | ICD-10-CM

## 2020-10-03 DIAGNOSIS — Z20822 Contact with and (suspected) exposure to covid-19: Secondary | ICD-10-CM

## 2020-10-03 DIAGNOSIS — I82621 Acute embolism and thrombosis of deep veins of right upper extremity: Secondary | ICD-10-CM | POA: Diagnosis present

## 2020-10-03 DIAGNOSIS — F17210 Nicotine dependence, cigarettes, uncomplicated: Secondary | ICD-10-CM | POA: Diagnosis present

## 2020-10-03 DIAGNOSIS — I130 Hypertensive heart and chronic kidney disease with heart failure and stage 1 through stage 4 chronic kidney disease, or unspecified chronic kidney disease: Principal | ICD-10-CM | POA: Diagnosis present

## 2020-10-03 DIAGNOSIS — I255 Ischemic cardiomyopathy: Secondary | ICD-10-CM | POA: Diagnosis present

## 2020-10-03 DIAGNOSIS — I509 Heart failure, unspecified: Secondary | ICD-10-CM | POA: Diagnosis present

## 2020-10-03 DIAGNOSIS — R109 Unspecified abdominal pain: Secondary | ICD-10-CM | POA: Diagnosis not present

## 2020-10-03 DIAGNOSIS — J90 Pleural effusion, not elsewhere classified: Secondary | ICD-10-CM | POA: Diagnosis not present

## 2020-10-03 DIAGNOSIS — I428 Other cardiomyopathies: Secondary | ICD-10-CM | POA: Diagnosis present

## 2020-10-03 DIAGNOSIS — I5021 Acute systolic (congestive) heart failure: Secondary | ICD-10-CM | POA: Diagnosis not present

## 2020-10-03 DIAGNOSIS — E785 Hyperlipidemia, unspecified: Secondary | ICD-10-CM | POA: Diagnosis present

## 2020-10-03 DIAGNOSIS — T82818A Embolism of vascular prosthetic devices, implants and grafts, initial encounter: Secondary | ICD-10-CM | POA: Diagnosis not present

## 2020-10-03 DIAGNOSIS — I517 Cardiomegaly: Secondary | ICD-10-CM | POA: Diagnosis not present

## 2020-10-03 DIAGNOSIS — I472 Ventricular tachycardia: Secondary | ICD-10-CM | POA: Diagnosis present

## 2020-10-03 DIAGNOSIS — Z955 Presence of coronary angioplasty implant and graft: Secondary | ICD-10-CM

## 2020-10-03 DIAGNOSIS — I340 Nonrheumatic mitral (valve) insufficiency: Secondary | ICD-10-CM | POA: Diagnosis present

## 2020-10-03 DIAGNOSIS — Z5900 Homelessness unspecified: Secondary | ICD-10-CM

## 2020-10-03 DIAGNOSIS — R52 Pain, unspecified: Secondary | ICD-10-CM | POA: Diagnosis not present

## 2020-10-03 DIAGNOSIS — Z7189 Other specified counseling: Secondary | ICD-10-CM

## 2020-10-03 DIAGNOSIS — B1920 Unspecified viral hepatitis C without hepatic coma: Secondary | ICD-10-CM | POA: Diagnosis present

## 2020-10-03 DIAGNOSIS — I2510 Atherosclerotic heart disease of native coronary artery without angina pectoris: Secondary | ICD-10-CM | POA: Diagnosis not present

## 2020-10-03 DIAGNOSIS — Z7984 Long term (current) use of oral hypoglycemic drugs: Secondary | ICD-10-CM

## 2020-10-03 DIAGNOSIS — Y838 Other surgical procedures as the cause of abnormal reaction of the patient, or of later complication, without mention of misadventure at the time of the procedure: Secondary | ICD-10-CM | POA: Diagnosis not present

## 2020-10-03 DIAGNOSIS — F1420 Cocaine dependence, uncomplicated: Secondary | ICD-10-CM | POA: Diagnosis present

## 2020-10-03 DIAGNOSIS — N179 Acute kidney failure, unspecified: Secondary | ICD-10-CM | POA: Diagnosis not present

## 2020-10-03 DIAGNOSIS — Z8249 Family history of ischemic heart disease and other diseases of the circulatory system: Secondary | ICD-10-CM

## 2020-10-03 DIAGNOSIS — I252 Old myocardial infarction: Secondary | ICD-10-CM | POA: Diagnosis not present

## 2020-10-03 DIAGNOSIS — J811 Chronic pulmonary edema: Secondary | ICD-10-CM | POA: Diagnosis not present

## 2020-10-03 DIAGNOSIS — Z515 Encounter for palliative care: Secondary | ICD-10-CM | POA: Diagnosis not present

## 2020-10-03 DIAGNOSIS — R0902 Hypoxemia: Secondary | ICD-10-CM | POA: Diagnosis not present

## 2020-10-03 DIAGNOSIS — J189 Pneumonia, unspecified organism: Secondary | ICD-10-CM | POA: Diagnosis not present

## 2020-10-03 DIAGNOSIS — R609 Edema, unspecified: Secondary | ICD-10-CM | POA: Diagnosis not present

## 2020-10-03 DIAGNOSIS — R1084 Generalized abdominal pain: Secondary | ICD-10-CM | POA: Diagnosis not present

## 2020-10-03 LAB — CBC
HCT: 38.8 % — ABNORMAL LOW (ref 39.0–52.0)
HCT: 38.7 % — ABNORMAL LOW (ref 39.0–52.0)
Hemoglobin: 12.6 g/dL — ABNORMAL LOW (ref 13.0–17.0)
Hemoglobin: 12.7 g/dL — ABNORMAL LOW (ref 13.0–17.0)
MCH: 28.4 pg (ref 26.0–34.0)
MCH: 28.5 pg (ref 26.0–34.0)
MCHC: 32.5 g/dL (ref 30.0–36.0)
MCHC: 32.8 g/dL (ref 30.0–36.0)
MCV: 87.4 fL (ref 80.0–100.0)
MCV: 86.8 fL (ref 80.0–100.0)
Platelets: 296 10*3/uL (ref 150–400)
Platelets: 263 10*3/uL (ref 150–400)
RBC: 4.44 MIL/uL (ref 4.22–5.81)
RBC: 4.46 MIL/uL (ref 4.22–5.81)
RDW: 17.4 % — ABNORMAL HIGH (ref 11.5–15.5)
RDW: 17.3 % — ABNORMAL HIGH (ref 11.5–15.5)
WBC: 8.2 10*3/uL (ref 4.0–10.5)
WBC: 8.4 10*3/uL (ref 4.0–10.5)
nRBC: 0 % (ref 0.0–0.2)
nRBC: 0 % (ref 0.0–0.2)

## 2020-10-03 LAB — HEPATIC FUNCTION PANEL
ALT: 39 U/L (ref 0–44)
AST: 31 U/L (ref 15–41)
Albumin: 2.6 g/dL — ABNORMAL LOW (ref 3.5–5.0)
Alkaline Phosphatase: 110 U/L (ref 38–126)
Bilirubin, Direct: 0.4 mg/dL — ABNORMAL HIGH (ref 0.0–0.2)
Indirect Bilirubin: 0.4 mg/dL (ref 0.3–0.9)
Total Bilirubin: 0.8 mg/dL (ref 0.3–1.2)
Total Protein: 5.7 g/dL — ABNORMAL LOW (ref 6.5–8.1)

## 2020-10-03 LAB — URINALYSIS, ROUTINE W REFLEX MICROSCOPIC
Bacteria, UA: NONE SEEN
Bilirubin Urine: NEGATIVE
Glucose, UA: NEGATIVE mg/dL
Hgb urine dipstick: NEGATIVE
Ketones, ur: NEGATIVE mg/dL
Leukocytes,Ua: NEGATIVE
Nitrite: NEGATIVE
Protein, ur: 100 mg/dL — AB
Specific Gravity, Urine: 1.021 (ref 1.005–1.030)
pH: 5 (ref 5.0–8.0)

## 2020-10-03 LAB — COMPREHENSIVE METABOLIC PANEL
ALT: 41 U/L (ref 0–44)
AST: 36 U/L (ref 15–41)
Albumin: 3 g/dL — ABNORMAL LOW (ref 3.5–5.0)
Alkaline Phosphatase: 130 U/L — ABNORMAL HIGH (ref 38–126)
Anion gap: 11 (ref 5–15)
BUN: 37 mg/dL — ABNORMAL HIGH (ref 6–20)
CO2: 23 mmol/L (ref 22–32)
Calcium: 9.1 mg/dL (ref 8.9–10.3)
Chloride: 104 mmol/L (ref 98–111)
Creatinine, Ser: 2.08 mg/dL — ABNORMAL HIGH (ref 0.61–1.24)
GFR calc non Af Amer: 35 mL/min — ABNORMAL LOW (ref >60)
Glucose, Bld: 100 mg/dL — ABNORMAL HIGH (ref 70–99)
Potassium: 4 mmol/L (ref 3.5–5.1)
Sodium: 138 mmol/L (ref 135–145)
Total Bilirubin: 1.3 mg/dL — ABNORMAL HIGH (ref 0.3–1.2)
Total Protein: 6.5 g/dL (ref 6.5–8.1)

## 2020-10-03 LAB — TROPONIN I (HIGH SENSITIVITY): Troponin I (High Sensitivity): 185 ng/L (ref <18)

## 2020-10-03 LAB — RAPID URINE DRUG SCREEN, HOSP PERFORMED
Amphetamines: NOT DETECTED
Barbiturates: NOT DETECTED
Benzodiazepines: NOT DETECTED
Cocaine: POSITIVE — AB
Opiates: NOT DETECTED
Tetrahydrocannabinol: NOT DETECTED

## 2020-10-03 LAB — MAGNESIUM: Magnesium: 2 mg/dL (ref 1.7–2.4)

## 2020-10-03 LAB — CREATININE, SERUM
Creatinine, Ser: 2.04 mg/dL — ABNORMAL HIGH (ref 0.61–1.24)
GFR calc non Af Amer: 36 mL/min — ABNORMAL LOW (ref >60)

## 2020-10-03 LAB — LIPASE, BLOOD: Lipase: 38 U/L (ref 11–51)

## 2020-10-03 LAB — DIGOXIN LEVEL: Digoxin Level: 0.3 ng/mL — ABNORMAL LOW (ref 1.0–2.0)

## 2020-10-03 LAB — BRAIN NATRIURETIC PEPTIDE: B Natriuretic Peptide: 2190.9 pg/mL — ABNORMAL HIGH (ref 0.0–100.0)

## 2020-10-03 MED ORDER — ASPIRIN EC 81 MG PO TBEC
81.0000 mg | DELAYED_RELEASE_TABLET | Freq: Every day | ORAL | Status: DC
  Administered 2020-10-04 – 2020-10-09 (×6): 81 mg via ORAL
  Filled 2020-10-03 (×6): qty 1

## 2020-10-03 MED ORDER — ACETAMINOPHEN 325 MG PO TABS
650.0000 mg | ORAL_TABLET | ORAL | Status: DC | PRN
  Administered 2020-10-06 – 2020-10-08 (×3): 650 mg via ORAL
  Filled 2020-10-03 (×3): qty 2

## 2020-10-03 MED ORDER — ADULT MULTIVITAMIN W/MINERALS CH
ORAL_TABLET | Freq: Every day | ORAL | Status: DC
  Administered 2020-10-04 – 2020-10-09 (×6): 1 via ORAL
  Filled 2020-10-03 (×6): qty 1

## 2020-10-03 MED ORDER — MILRINONE LACTATE IN DEXTROSE 20-5 MG/100ML-% IV SOLN
0.2500 ug/kg/min | INTRAVENOUS | Status: DC
  Administered 2020-10-03 – 2020-10-06 (×5): 0.25 ug/kg/min via INTRAVENOUS
  Filled 2020-10-03 (×5): qty 100

## 2020-10-03 MED ORDER — ISOSORB DINITRATE-HYDRALAZINE 20-37.5 MG PO TABS: 1.0000 | ORAL_TABLET | Freq: Three times a day (TID) | ORAL | Status: DC

## 2020-10-03 MED ORDER — NITROGLYCERIN 0.4 MG SL SUBL: 0.4000 mg | SUBLINGUAL_TABLET | SUBLINGUAL | Status: DC | PRN

## 2020-10-03 MED ORDER — FUROSEMIDE 10 MG/ML IJ SOLN
80.0000 mg | Freq: Once | INTRAMUSCULAR | Status: AC
  Administered 2020-10-03: 80 mg via INTRAVENOUS
  Filled 2020-10-03: qty 8

## 2020-10-03 MED ORDER — HEPARIN SODIUM (PORCINE) 5000 UNIT/ML IJ SOLN
5000.0000 [IU] | Freq: Three times a day (TID) | INTRAMUSCULAR | Status: DC
  Administered 2020-10-03 – 2020-10-09 (×14): 5000 [IU] via SUBCUTANEOUS
  Filled 2020-10-03 (×15): qty 1

## 2020-10-03 MED ORDER — ALBUTEROL SULFATE HFA 108 (90 BASE) MCG/ACT IN AERS
2.0000 | INHALATION_SPRAY | RESPIRATORY_TRACT | Status: DC | PRN
  Administered 2020-10-05: 2 via RESPIRATORY_TRACT
  Filled 2020-10-03: qty 6.7

## 2020-10-03 MED ORDER — ATORVASTATIN CALCIUM 80 MG PO TABS
80.0000 mg | ORAL_TABLET | Freq: Every day | ORAL | Status: DC
  Administered 2020-10-04 – 2020-10-09 (×6): 80 mg via ORAL
  Filled 2020-10-03 (×6): qty 1

## 2020-10-03 MED ORDER — FUROSEMIDE 10 MG/ML IJ SOLN: 80.0000 mg | Freq: Two times a day (BID) | INTRAMUSCULAR | Status: DC

## 2020-10-03 MED ORDER — POTASSIUM CHLORIDE CRYS ER 20 MEQ PO TBCR
20.0000 meq | EXTENDED_RELEASE_TABLET | Freq: Every day | ORAL | Status: DC
  Administered 2020-10-04 – 2020-10-09 (×6): 20 meq via ORAL
  Filled 2020-10-03 (×6): qty 1

## 2020-10-03 MED ORDER — EMPAGLIFLOZIN 10 MG PO TABS: 10.0000 mg | ORAL_TABLET | Freq: Every day | ORAL | Status: DC

## 2020-10-03 MED ORDER — PANTOPRAZOLE SODIUM 40 MG PO TBEC
40.0000 mg | DELAYED_RELEASE_TABLET | Freq: Every day | ORAL | Status: DC
  Administered 2020-10-04 – 2020-10-09 (×6): 40 mg via ORAL
  Filled 2020-10-03 (×6): qty 1

## 2020-10-03 MED ORDER — FUROSEMIDE 10 MG/ML IJ SOLN
80.0000 mg | Freq: Two times a day (BID) | INTRAMUSCULAR | Status: DC
  Administered 2020-10-03 – 2020-10-04 (×3): 80 mg via INTRAVENOUS
  Filled 2020-10-03 (×3): qty 8

## 2020-10-03 MED ORDER — DIGOXIN 125 MCG PO TABS
0.1250 mg | ORAL_TABLET | Freq: Every day | ORAL | Status: DC
  Administered 2020-10-04: 0.125 mg via ORAL
  Filled 2020-10-03: qty 1

## 2020-10-03 MED ORDER — ONDANSETRON HCL 4 MG/2ML IJ SOLN: 4.0000 mg | Freq: Four times a day (QID) | INTRAMUSCULAR | Status: DC | PRN

## 2020-10-03 MED ORDER — SODIUM CHLORIDE 0.9% FLUSH
3.0000 mL | Freq: Two times a day (BID) | INTRAVENOUS | Status: DC
  Administered 2020-10-04 – 2020-10-09 (×7): 3 mL via INTRAVENOUS

## 2020-10-03 MED ORDER — SODIUM CHLORIDE 0.9 % IV SOLN: 250.0000 mL | INTRAVENOUS | Status: DC | PRN

## 2020-10-03 MED ORDER — SODIUM CHLORIDE 0.9% FLUSH: 3.0000 mL | INTRAVENOUS | Status: DC | PRN

## 2020-10-03 MED ORDER — ASPIRIN 81 MG PO TBEC: 81.0000 mg | DELAYED_RELEASE_TABLET | Freq: Every day | ORAL | Status: DC

## 2020-10-03 NOTE — ED Triage Notes (Signed)
To ED [facility]-- pt c/o severe abd pain-  Recently d/c'd from hospital 9/26- with COVID, has EF <20%, has NOT been taking meds for approx 9 days since discharged from hospital.  States abd is not as painful at present.  Admits to smoking crack within past 48 hours

## 2020-10-03 NOTE — ED Notes (Signed)
Pt seems to be working harder to breath- more comfortable standing or sitting on edge of bed- MD made aware- O2 placed at 2L/m/[place]. sats = 100%

## 2020-10-03 NOTE — H&P (Addendum)
Cardiology Admission History and Physical:   Patient ID: Tyler Wong MRN: [id number]; DOB: May 25, 1966   Admission date: 10/03/2020  Primary Care Provider: [name], NP [facility] Cardiologist: Shelva Majestic, MD  [place] Electrophysiologist:  None   Chief Complaint:  Severe abd and SOB  Patient Profile:   Tyler Wong is a 54 y.o. male with hx of CAD- remote stent, DES to LAD, 2015, HTN, cocaine, chronic systolic HF followed by Dr. Aundra Dubin, recent [place] with admit 09/19/20 and discharge 09/21/20 now arrived with SOB and abd pain by EMS     History of Present Illness:   [name] with hx of CAD, DES to LAD in 2015, HTN, cocaine, and chronic systolic HF, RHC and LHC 24/0973 with significant CAD but no target.  Possible low EF due to cocaine abuse.   EF <20%. Was placed on HF meds.   4/21 with acute systolic HF, and low co-ox, hypotensive, + milrinone and IV lasix.  Improved, but not a candidate for home inotrope nor advanced therapies due to drug abuse and poor compliance.  BP too soft for Entresto, no BB due to low output.  On po torsemide  Prior d/c wt 159 lb.  Failed retrial of spironolactone due to hyperkalemia and losartan not added back  WT then was 164 lbs.    Was admitted 09/19/20 COVID + with respiratory failure. Troponin elevated 279.  Given remdesivir, monoclonal antibody and steroids.  No PNA. He improved with normal sp02 and was discharged 09/21/20.    Decadron as outpt for total 10 day course.    Today pt presents by EMS with acute CHF. + abd distention.  Has not taken home meds     EKG:  The ECG that was done today with ST and no acute ST changes was personally reviewed  Na 138, K+ 4.0, glucose 100, BUN 37, Cr 2.08 alk phos 130, albumn 3.0  BNP 2190 Hgb 12.6, WBC 8.2, plts 296 PCXR  FINDINGS: Small focus of scar in right upper lobe noted. The lungs are otherwise clear. There is cardiomegaly. Aortic atherosclerosis. No pneumothorax or pleural effusion. No  acute or focal bony abnormality.   IMPRESSION: Cardiomegaly without acute disease.   Rec'd 80 mg IV lasix at 1526 with small output Beginning milrinone   BP 107/96, HR 120s to 100    Past Medical History:  Diagnosis Date   CAD in native artery 07/17/14   STEMI- LAD stenosis with DES   CHF (congestive heart failure) (HCC)    Cocaine abuse (HCC)    COPD (chronic obstructive pulmonary disease) (HCC)    Dyspnea    Hypertension    ST elevation myocardial infarction (STEMI) involving left anterior descending (LAD) coronary artery with complication ([place])    Tobacco abuse 07/17/2014    Past Surgical History:  Procedure Laterality Date   CORONARY ANGIOPLASTY WITH STENT PLACEMENT  07/18/14   resolute DES to LAD STEMI   LEFT HEART CATH N/A 07/17/2014   Procedure: LEFT HEART CATH;  Surgeon: [name], MD;  Location: [facility] CATH LAB;  Service: Cardiovascular;  Laterality: N/A;   PERCUTANEOUS CORONARY STENT INTERVENTION (PCI-S)  07/17/2014   Procedure: PERCUTANEOUS CORONARY STENT INTERVENTION (PCI-S);  Surgeon: [name], MD;  Location: [facility] CATH LAB;  Service: Cardiovascular;;   RIGHT/LEFT HEART CATH AND CORONARY ANGIOGRAPHY N/A 10/03/2018   Procedure: RIGHT/LEFT HEART CATH AND CORONARY ANGIOGRAPHY;  Surgeon: [name], MD;  Location: [place] CV LAB;  Service: Cardiovascular;  Laterality: N/A;     Medications Prior to Admission: Prior to Admission medications   Medication Sig Start Date End Date Taking? Authorizing Provider  albuterol (VENTOLIN HFA) 108 (90 Base) MCG/ACT inhaler Inhale 2 puffs into the lungs every 4 (four) hours as needed for wheezing or shortness of breath. 01/24/20   [name], NP  aspirin 81 MG EC tablet Take 1 tablet (81 mg total) by mouth daily. 04/25/20   Lyda Jester M, PA-C  atorvastatin (LIPITOR) 80 MG tablet Take 1 tablet (80 mg total) by mouth daily. 04/25/20   Lyda Jester M, PA-C  dexamethasone (DECADRON) 6 MG tablet Take 1  tablet (6 mg total) by mouth daily. 09/22/20   Elgergawy, Silver Huguenin, MD  digoxin (LANOXIN) 0.125 MG tablet Take 1 tablet (0.125 mg total) by mouth daily. 07/11/20   [name], MD  empagliflozin (JARDIANCE) 10 MG TABS tablet Take 1 tablet (10 mg total) by mouth daily before breakfast. 08/27/20   Lyda Jester M, PA-C  isosorbide-hydrALAZINE (BIDIL) 20-37.5 MG tablet Take 1 tablet by mouth 3 (three) times daily. 09/24/20   Elgergawy, Silver Huguenin, MD  Multiple Vitamin (MULTIVITAMIN) tablet Take 1 tablet by mouth daily.     [provider]  NITROSTAT 0.4 MG SL tablet PLACE 1 TABLET UNDER TONGUE AS NEEDED FOR CHEST PAIN EVERY 5 MINUTES X 3 MAX DOSES.  CALL 911 IF PAIN PERSISTS Patient taking differently: Place 0.4 mg under the tongue every 5 (five) minutes x 3 doses as needed for chest pain.  02/10/18   [name], MD  omeprazole (PRILOSEC) 20 MG capsule Take 1 capsule (20 mg total) by mouth daily. 01/24/20   [name], NP  potassium chloride SA (KLOR-CON) 20 MEQ tablet Take 1 tablet (20 mEq total) by mouth daily. 08/29/20   Lyda Jester M, PA-C  torsemide (DEMADEX) 20 MG tablet Take 40 mg daily and additional 20 mg as needed For 3 lb weight gain over night or 5 lb weight gain in a week Patient taking differently: Take 20-40 mg by mouth See admin instructions. Take 40 mg by mouth daily and an additional 20 mg as needed for a 3-lbs weight gain overnight or 5 lbs in a week 08/27/20   Lyda Jester M, PA-C   PT WAS ON NO MEDS PRIOR TO ADMIT SINCE LAST DISCHARGE  Allergies:   No Known Allergies  Social History:   Social History   Socioeconomic History   Marital status: Single    Spouse name: Not on file   Number of children: 2   Years of education: 12   Highest education level: 12th grade  Occupational History   Occupation: Landscaping  Tobacco Use   Smoking status: Current Every Day Smoker    Packs/day: 0.50    Years: 30.00    Pack years: 15.00    Types:  Cigarettes   Smokeless tobacco: Never Used   Tobacco comment: .5 PK PER DAY  Vaping Use   Vaping Use: Never used  Substance and Sexual Activity   Alcohol use: No    Comment: Patient denies abuse, states social drinker   Drug use: Yes    Types: Cocaine    Comment: heroin   Sexual activity: Yes  Other Topics Concern   Not on file  Social History Narrative   Lives in [place] with his sister and girlfriend. He works Aeronautical engineer work.    Social Determinants of Health   Financial Resource Strain:  Difficulty of Paying Living Expenses: Not on file  Food Insecurity: Food Insecurity Present   Worried About [place] in the Last Year: Sometimes true   Ran Out of Food in the Last Year: Never true  Transportation Needs: No Transportation Needs   Lack of Transportation (Medical): No   Lack of Transportation (Non-Medical): No  Physical Activity:    Days of Exercise per Week: Not on file   Minutes of Exercise per Session: Not on file  Stress:    Feeling of Stress : Not on file  Social Connections:    Frequency of Communication with Friends and Family: Not on file   Frequency of Social Gatherings with Friends and Family: Not on file   Attends Religious Services: Not on file   Active Member of Clubs or Organizations: Not on file   Attends Archivist Meetings: Not on file   Marital Status: Not on file  Intimate Partner Violence:    Fear of Current or Ex-Partner: Not on file   Emotionally Abused: Not on file   Physically Abused: Not on file   Sexually Abused: Not on file    Family History:   The patient's family history includes Cirrhosis in his father; Heart attack in his brother, mother, and sister; Heart failure in his sister.    ROS:  Please see the history of present illness.  All other ROS reviewed and negative.    General:no colds or fevers, no weight changes Skin:no rashes or ulcers HEENT:no blurred vision, no congestion CV:see  HPI PUL:see HPI GI:no diarrhea constipation or melena, no indigestion GU:no hematuria, no dysuria MS:no joint pain, no claudication Neuro:no syncope, no lightheadedness Endo:no diabetes, no thyroid disease   Physical Exam/Data:   Vitals:   10/03/20 1037 10/03/20 1038 10/03/20 1047  BP: 104/87    Pulse: 94    Resp: (!) 38    Temp: 97.6 F (36.4 C)    TempSrc: Oral    SpO2: 96%  96%  Weight:  74 kg   Height:  5' 10"  (1.778 m)    No intake or output data in the 24 hours ending 10/03/20 1807 Last 3 Weights 10/03/2020 09/21/2020 09/20/2020  Weight (lbs) 163 lb 2.3 oz 163 lb 12.8 oz 161 lb 13.1 oz  Weight (kg) 74 kg 74.3 kg 73.4 kg     Body mass index is 23.41 kg/m.  EXAM per Dr. Sallyanne Kuster  General:Pleasant affect, NAD Skin:Warm and dry, brisk capillary refill HEENT:normocephalic, sclera clear, mucus membranes moist Neck:supple, + JVD, no bruits  Heart:S1S2 RRR with +  murmur, no gallup, rub or click Lungs:clear without rales, rhonchi, or wheezes HBZ:JIRC, non tender, + BS, do not palpate liver spleen or masses Ext:+ lower ext edema, 2+ pedal pulses, 2+ radial pulses Neuro:alert and oriented X 3, MAE, follows commands, + facial symmetry   Relevant CV Studies: Last echo 04/10/20 IMPRESSIONS     1. Left ventricular ejection fraction, by estimation, is <20%. The left  ventricle has severely decreased function. The left ventricle demonstrates  global hypokinesis. The left ventricular internal cavity size was  moderately dilated. There is mild left  ventricular hypertrophy. Left ventricular diastolic parameters are  consistent with Grade II diastolic dysfunction (pseudonormalization).   2. Right ventricular systolic function is mildly reduced. The right  ventricular size is moderately enlarged. There is moderately elevated  pulmonary artery systolic pressure.   3. Left atrial size was moderately dilated.   4. Right atrial size was moderately dilated.  5. The mitral valve  is normal in structure. Moderate mitral valve  regurgitation. No evidence of mitral stenosis.   6. Tricuspid valve regurgitation is severe.   7. The aortic valve is normal in structure. Aortic valve regurgitation is  not visualized. No aortic stenosis is present.   FINDINGS   Left Ventricle: Left ventricular ejection fraction, by estimation, is  <20%. The left ventricle has severely decreased function. The left  ventricle demonstrates global hypokinesis. The left ventricular internal  cavity size was moderately dilated. There  is mild left ventricular hypertrophy. Left ventricular diastolic  parameters are consistent with Grade II diastolic dysfunction  (pseudonormalization).   Right Ventricle: The right ventricular size is moderately enlarged. No  increase in right ventricular wall thickness. Right ventricular systolic  function is mildly reduced. There is moderately elevated pulmonary artery  systolic pressure. The tricuspid  regurgitant velocity is 2.92 m/s, and with an assumed right atrial  pressure of 15 mmHg, the estimated right ventricular systolic pressure is  80.3 mmHg.   Left Atrium: Left atrial size was moderately dilated.   Right Atrium: Right atrial size was moderately dilated.   Pericardium: Trivial pericardial effusion is present.   Mitral Valve: The mitral valve is normal in structure. Moderate mitral  valve regurgitation. No evidence of mitral valve stenosis.   Tricuspid Valve: The tricuspid valve is normal in structure. Tricuspid  valve regurgitation is severe. No evidence of tricuspid stenosis.   Aortic Valve: The aortic valve is normal in structure. Aortic valve  regurgitation is not visualized. No aortic stenosis is present.   Pulmonic Valve: The pulmonic valve was normal in structure. Pulmonic valve  regurgitation is trivial. No evidence of pulmonic stenosis.   Aorta: The aortic root and ascending aorta are structurally normal, with  no evidence of  dilitation.   IAS/Shunts: The atrial septum is grossly normal.      LEFT VENTRICLE   Laboratory Data:  High Sensitivity Troponin:   Recent Labs  Lab 09/19/20 2058 09/20/20 0125 09/20/20 0708  TROPONINIHS 279* 278* 256*      Chemistry Recent Labs  Lab 10/03/20 1141  NA 138  K 4.0  CL 104  CO2 23  GLUCOSE 100*  BUN 37*  CREATININE 2.08*  CALCIUM 9.1  GFRNONAA 35*  ANIONGAP 11    Recent Labs  Lab 10/03/20 1141  PROT 6.5  ALBUMIN 3.0*  AST 36  ALT 41  ALKPHOS 130*  BILITOT 1.3*   Hematology Recent Labs  Lab 10/03/20 1050  WBC 8.2  RBC 4.44  HGB 12.6*  HCT 38.8*  MCV 87.4  MCH 28.4  MCHC 32.5  RDW 17.4*  PLT 296   BNP Recent Labs  Lab 10/03/20 1222  BNP 2,190.9*    DDimer No results for input(s): DDIMER in the last 168 hours.   Radiology/Studies:  DG Chest Port 1 View  Result Date: 10/03/2020 CLINICAL DATA:  The patient was discharged from the hospital 09/22/2020 after admission for COVID-19 pneumonia. Abdominal pain today. EXAM: PORTABLE CHEST 1 VIEW COMPARISON:  Single-view of the chest 09/19/2020. PA and lateral chest 12/13/2017. FINDINGS: Small focus of scar in right upper lobe noted. The lungs are otherwise clear. There is cardiomegaly. Aortic atherosclerosis. No pneumothorax or pleural effusion. No acute or focal bony abnormality. IMPRESSION: Cardiomegaly without acute disease. Electronically Signed   By: Inge Rise M.D.   On: 10/03/2020 12:10     Assessment and Plan:   Acute on chronic systolic HF - with edema, abd  distention, SOB - no outpt meds since 09/21/20.  BNP elevated. Plan to place PICC and do CO-OX and start milrinone.  Lasix 40 IV every 12 hours.  OK for PICC line - not a candidate for dialysis. hold bidil tonight. strict I&O COVID + 09/19/20 will place on isolation Cocaine use - needs UDS  Pt intolerant to BB as well CAD with stent to LAD with MI in 2015. Mild troponin elevation with COVID last admit, no chest pain AKI  with cr 2.08 up from 1.61 on 09/19/20  Non compliance   Talked to IM about isolation and 10-14 days is time for isolation if mild symptoms.   So will not add isolation     did not retest for COVID - he most likely will be   New York Heart Association (NYHA) Functional Class NYHA Class IV   Severity of Illness: The appropriate patient status for this patient is INPATIENT. Inpatient status is judged to be reasonable and necessary in order to provide the required intensity of service to ensure the patient's safety. The patient's presenting symptoms, physical exam findings, and initial radiographic and laboratory data in the context of their chronic comorbidities is felt to place them at high risk for further clinical deterioration. Furthermore, it is not anticipated that the patient will be medically stable for discharge from the hospital within 2 midnights of admission. The following factors support the patient status of inpatient.   " The patient's presenting symptoms include acute abd pain due to acute CHF and lower ext edema. " The worrisome physical exam findings include edema. " The initial radiographic and laboratory data are worrisome because of elevated BNP . " The chronic co-morbidities include EF <20% .   * I certify that at the point of admission it is my clinical judgment that the patient will require inpatient hospital care spanning beyond 2 midnights from the point of admission due to high intensity of service, high risk for further deterioration and high frequency of surveillance required.*    For questions or updates, please contact [place] Please consult [email or web address] for contact info under     Signed, Cecilie Kicks, NP  10/03/2020 6:07 PM   I have seen and examined the patient along with Cecilie Kicks, NP .  I have reviewed the chart, notes and new data.  I agree with PA/NP's note.  Key new complaints: slight improvement in breathing since arrival, but still  orthopneic. Missed all HF meds for 9 days. Used cocaine 2-3 days ago. Recent COVID-19 infection (dx 9/23, received MAb), currently asymptomatic. Wife kicked him out after his drug relapse, has been living outside. Key examination changes: JVP 8-9 cm, rales bilateral bases, RRR, tachycardic, tight pulse pressure, 3/6 holosystolic apical and LLSB murmurs, S3 present, warm hands, cool toes. UO starting to improve Key new findings / data: worsened renal function, mild hs Trop elevation in flat pattern  PLAN: In HF exacerbation with some signs of low CO, at risk for cardiogenic shock. Start IV milrinone, low dose adjusted for renal function and continue IV diuretics. Premature to initiate oral HF meds. Compliance with medications, abstinence from drug use and social issues/shelter are likely to adversely affect his prognosis.  Sanda Klein, MD, [place] [phone] 10/04/2020, 8:46 AM

## 2020-10-03 NOTE — ED Provider Notes (Signed)
[facility] EMERGENCY DEPARTMENT Provider Note   CSN: [id number] Arrival date & time: 10/03/20  1019     History Chief Complaint  Patient presents with  . Abdominal Pain  . Covid Positive    Tyler Wong is a 54 y.o. male with past medical history of CAD, hypertension, COPD, CHF with EF of less than 25%, recent COVID-19 virus, hepatitis C, presenting to the emergency department via EMS my postop with complaint of abdominal pain.  Patient states he has not had his home medications since he was discharged from the hospital because his wife kicked him out of the house.  His medications are at his home.  He was recently admitted in September for acute respiratory failure in the setting of COVID-19 virus, and discharged on the 25th.  He states he began feeling his abdomen becomes distended and painful, he was having difficulty taking a deep breath.  His abdominal distention felt similar to when he was experiencing too much fluid from his CHF.  He endorses worsening lower leg swelling as well.  He denies shortness of breath or chest pain.  His abdominal pain has improved since EMS has brought him here to the ED.  No new fevers or chills.  Patient has history of cocaine abuse.  States he has not used cocaine in "48 hours."  The history is provided by the patient and medical records.       Past Medical History:  Diagnosis Date  . CAD in native artery 07/17/14   STEMI- LAD stenosis with DES  . CHF (congestive heart failure) ([place])   . Cocaine abuse ([place])   . COPD (chronic obstructive pulmonary disease) ([place])   . Dyspnea   . Hypertension   . ST elevation myocardial infarction (STEMI) involving left anterior descending (LAD) coronary artery with complication ([place])   . Tobacco abuse 07/17/2014    Patient Active Problem List   Diagnosis Date Noted  . Acute respiratory disease due to COVID-19 virus 09/20/2020  . Acute respiratory failure due to COVID-19 ([place]) 09/19/2020  .  Cardiorenal syndrome 06/29/2020  . 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 Acute on chronic clinical systolic heart failure ([place]) 06/22/2020  . Acute on chronic systolic (congestive) heart failure ([place]) 06/22/2020  . Palliative care encounter   . Cardiogenic shock ([place]) 04/11/2020  . CKD (chronic kidney disease) stage 3, GFR 30-59 ml/min (HCC) 04/11/2020  . Symptomatic anemia 04/09/2020  . Acute on chronic HFrEF (heart failure with reduced ejection fraction) ([place]) 05/05/2019  . HFrEF (heart failure with reduced ejection fraction) ([place]) 05/04/2019  . Acute combined systolic and diastolic congestive heart failure ([place]) 10/02/2018  . Hepatitis C 04/13/2017  . Current non-adherence to medical treatment 04/13/2017  . Acute on chronic combined systolic (congestive) and diastolic (congestive) heart failure ([place]) 04/13/2017  . HTN (hypertension) 04/12/2017  . Insomnia 01/18/2017  . Chronic combined systolic and diastolic congestive heart failure ([place]) 01/04/2017  . Hyperlipidemia 01/04/2017  . Chest pain 12/30/2016  . CAD S/P percutaneous coronary angioplasty 07/18/2014  . Tobacco abuse 07/17/2014  . Cocaine use 07/17/2014    Past Surgical History:  Procedure Laterality Date  . CORONARY ANGIOPLASTY WITH STENT PLACEMENT  07/18/14   resolute DES to LAD STEMI  . LEFT HEART CATH N/A 07/17/2014   Procedure: LEFT HEART CATH;  Surgeon: [name], MD;  Location[facility] C[facility] LAB;  Service: Cardiovascular;  Laterality: N/A;  . PERCUTANEOUS CORONARY STENT INTERVENTION (PCI-S)  07/17/2014   Procedure:  PERCUTANEOUS CORONARY STENT INTERVENTION (PCI-S);  Surgeon: [name], MD;  Location: [facility] CATH LAB;  Service: Cardiovascular;;  . RIGHT/LEFT HEART CATH AND CORONARY ANGIOGRAPHY N/A 10/03/2018   Procedure: RIGHT/LEFT HEART CATH AND CORONARY ANGIOGRAPHY;  Surgeon: [name], MD;  Location: [facility] CV LAB;  Service: Cardiovascular;  Laterality: N/A;       Family History    Problem Relation Age of Onset  . Cirrhosis Father   . Heart attack Mother   . Heart attack Sister   . Heart failure Sister   . Heart attack Brother     Social History   Tobacco Use  . Smoking status: Current Every Day Smoker    Packs/day: 0.50    Years: 30.00    Pack years: 15.00    Types: Cigarettes  . Smokeless tobacco: Never Used  . Tobacco comment: .5 PK PER DAY  Vaping Use  . Vaping Use: Never used  Substance Use Topics  . Alcohol use: No    Comment: Patient denies abuse, states social drinker  . Drug use: Yes    Types: Cocaine    Comment: heroin    Home Medications Prior to Admission medications   Medication Sig Start Date End Date Taking? Authorizing Provider  albuterol (VENTOLIN HFA) 108 (90 Base) MCG/ACT inhaler Inhale 2 puffs into the lungs every 4 (four) hours as needed for wheezing or shortness of breath. 01/24/20   [name], NP  aspirin 81 MG EC tablet Take 1 tablet (81 mg total) by mouth daily. 04/25/20   Lyda Jester M, PA-C  atorvastatin (LIPITOR) 80 MG tablet Take 1 tablet (80 mg total) by mouth daily. 04/25/20   Lyda Jester M, PA-C  dexamethasone (DECADRON) 6 MG tablet Take 1 tablet (6 mg total) by mouth daily. 09/22/20   Elgergawy, Silver Huguenin, MD  digoxin (LANOXIN) 0.125 MG tablet Take 1 tablet (0.125 mg total) by mouth daily. 07/11/20   [name], MD  empagliflozin (JARDIANCE) 10 MG TABS tablet Take 1 tablet (10 mg total) by mouth daily before breakfast. 08/27/20   Lyda Jester M, PA-C  isosorbide-hydrALAZINE (BIDIL) 20-37.5 MG tablet Take 1 tablet by mouth 3 (three) times daily. 09/24/20   Elgergawy, Silver Huguenin, MD  Multiple Vitamin (MULTIVITAMIN) tablet Take 1 tablet by mouth daily.     [provider]  NITROSTAT 0.4 MG SL tablet PLACE 1 TABLET UNDER TONGUE AS NEEDED FOR CHEST PAIN EVERY 5 MINUTES X 3 MAX DOSES.  CALL 911 IF PAIN PERSISTS Patient taking differently: Place 0.4 mg under the tongue every 5 (five) minutes  x 3 doses as needed for chest pain.  02/10/18   [name], MD  omeprazole (PRILOSEC) 20 MG capsule Take 1 capsule (20 mg total) by mouth daily. 01/24/20   [name], NP  potassium chloride SA (KLOR-CON) 20 MEQ tablet Take 1 tablet (20 mEq total) by mouth daily. 08/29/20   Lyda Jester M, PA-C  torsemide (DEMADEX) 20 MG tablet Take 40 mg daily and additional 20 mg as needed For 3 lb weight gain over night or 5 lb weight gain in a week Patient taking differently: Take 20-40 mg by mouth See admin instructions. Take 40 mg by mouth daily and an additional 20 mg as needed for a 3-lbs weight gain overnight or 5 lbs in a week 08/27/20   Consuelo Pandy, PA-C    Allergies    Patient has no known allergies.  Review of Systems  Review of Systems  All other systems reviewed and are negative.   Physical Exam Updated Vital Signs BP 104/87 (BP Location: Left Arm)   Pulse 94   Temp 97.6 F (36.4 C) (Oral)   Resp (!) 38   Ht 5\' 10"  (1.778 m)   Wt 74 kg   SpO2 96%   BMI 23.41 kg/m   Physical Exam Vitals and nursing note reviewed.  Constitutional:      Appearance: He is well-developed.     Comments: Chronically ill-appearing, no acute distress.  HENT:     Head: Normocephalic and atraumatic.  Eyes:     Conjunctiva/sclera: Conjunctivae normal.  Cardiovascular:     Rate and Rhythm: Normal rate and regular rhythm.     Heart sounds: Gallop present.      Comments: 1+ pitting edema bilateral lower leg, JVD is present Pulmonary:     Effort: Pulmonary effort is normal. No respiratory distress.  Abdominal:     General: Bowel sounds are normal.     Palpations: Abdomen is soft.     Tenderness: There is no abdominal tenderness. There is no guarding or rebound.     Comments: Very slight distention if any is noted.  Skin:    General: Skin is warm.  Neurological:     Mental Status: He is alert.  Psychiatric:        Behavior: Behavior normal.     ED Results / Procedures /  Treatments   Labs (all labs ordered are listed, but only abnormal results are displayed) Labs Reviewed  CBC - Abnormal; Notable for the following components:      Result Value   Hemoglobin 12.6 (*)    HCT 38.8 (*)    RDW 17.4 (*)    All other components within normal limits  BRAIN NATRIURETIC PEPTIDE - Abnormal; Notable for the following components:   B Natriuretic Peptide 2,190.9 (*)    All other components within normal limits  COMPREHENSIVE METABOLIC PANEL - Abnormal; Notable for the following components:   Glucose, Bld 100 (*)    BUN 37 (*)    Creatinine, Ser 2.08 (*)    Albumin 3.0 (*)    Alkaline Phosphatase 130 (*)    Total Bilirubin 1.3 (*)    GFR calc non Af Amer 35 (*)    All other components within normal limits  LIPASE, BLOOD  URINALYSIS, ROUTINE W REFLEX MICROSCOPIC    EKG EKG Interpretation  Date/Time:  Thursday October 03 2020 10:32:14 EDT Ventricular Rate:  96 PR Interval:    QRS Duration: 99 QT Interval:  395 QTC Calculation: 500 R Axis:   -75 Text Interpretation: Sinus tachycardia Paired ventricular premature complexes Biatrial enlargement Inferior infarct, old Anterior infarct, old Lateral leads are also involved similar to Sept 2021 Confirmed by Sherwood Gambler [phone]) on 10/03/2020 1:34:39 PM   Radiology DG Chest Port 1 View  Result Date: 10/03/2020 CLINICAL DATA:  The patient was discharged from the hospital 09/22/2020 after admission for COVID-19 pneumonia. Abdominal pain today. EXAM: PORTABLE CHEST 1 VIEW COMPARISON:  Single-view of the chest 09/19/2020. PA and lateral chest 12/13/2017. FINDINGS: Small focus of scar in right upper lobe noted. The lungs are otherwise clear. There is cardiomegaly. Aortic atherosclerosis. No pneumothorax or pleural effusion. No acute or focal bony abnormality. IMPRESSION: Cardiomegaly without acute disease. Electronically Signed   By: Inge Rise M.D.   On: 10/03/2020 12:10    Procedures Procedures (including  critical care time)  Medications Ordered in ED  Medications  furosemide (LASIX) injection 80 mg (has no administration in time range)    ED Course  I have reviewed the triage vital signs and the nursing notes.  Pertinent labs & imaging results that were available during my care of the patient were reviewed by me and considered in my medical decision making (see chart for details).    MDM Rules/Calculators/A&P                          Patient with history of CHF, noncompliant with medications, EF of less than 20%, presenting to the ED via EMS for abdominal distention and lower extremity edema.  He was discharged from the hospital on the 26th, admitted for acute respiratory failure in the setting of COVID-19.  Since discharge he has been noncompliant with his medications because his medications have been in his house, however his wife kicked him out and locked him out.  He is not complaining of shortness of breath or chest pain on arrival though does endorse increased lower extremity edema and some abdominal distention and discomfort.  He states abdominal pain improved upon arrival to the ED.  On exam, he has 1+ pitting edema to lower extremities, lung sounds are diminished with fine rales, heart sounds with gallop.  He is not hypoxic.  Chest x-ray is negative for edema or infiltrate.  BNP is markedly elevated at 2200.  Given lack of respiratory symptoms at this time, plan to IV diuresis.  If symptom improvement, and stabilization, will consider discharge with refill of medications.  If decompensating, will need admission.  Care assumed at shift change by PA couture. Final Clinical Impression(s) / ED Diagnoses Final diagnoses:  None    Rx / DC Orders ED Discharge Orders    None       Kerstie Agent, [place] N, PA-C 10/03/20 1603    Sherwood Gambler, MD 10/08/20 [phone]

## 2020-10-03 NOTE — Consult Note (Addendum)
Medical Consultation   Tyler Wong  MPN:[id number]  DOB: 1966-02-28  DOA: 10/03/2020  PCP: [name], NP   Requesting physician:  Cardiology   Reason for consultation:  Management of non-cardiac issues    History of Present Illness: Tyler Wong is an 54 y.o. male coronary artery disease, hypertension, cocaine abuse, and chronic systolic CHF with EF less than 20%, now presenting to the emergency department with abdominal distention and increased lower extremity edema in the setting of medication compliance.  Patient reports that he was recently kicked out of his house by his spouse after an argument and has not had access to his medications for that reason.  Patient was diagnosed with COVID-19 14 days ago.  He had mild disease, was treated with monoclonal antibodies, is not having fevers or respiratory symptoms, and no longer requires isolation for this.  He was found to be afebrile, saturating in the 90s on room air, and with systolic blood pressure in the 80s.  EKG features a sinus rhythm with PVCs.  Chest x-ray notable for cardiomegaly without acute findings.  He was found to have creatinine of 2.08, up from 1.61 couple weeks ago.  BNP was elevated to 2191 and troponin elevated to 185.  He was treated with 30 mg IV Lasix in the ED, admitted to the cardiology service, and started on milrinone infusion.   Review of Systems:  ROS As per HPI otherwise 10 point review of systems negative.     Past Medical History: Past Medical History:  Diagnosis Date  . CAD in native artery 07/17/14   STEMI- LAD stenosis with DES  . CHF (congestive heart failure) ([place])   . Cocaine abuse ([place])   . COPD (chronic obstructive pulmonary disease) ([place])   . Dyspnea   . Hypertension   . ST elevation myocardial infarction (STEMI) involving left anterior descending (LAD) coronary artery with complication ([place])   . Tobacco abuse 07/17/2014    Past Surgical History: Past Surgical  History:  Procedure Laterality Date  . CORONARY ANGIOPLASTY WITH STENT PLACEMENT  07/18/14   resolute DES to LAD STEMI  . LEFT HEART CATH N/A 07/17/2014   Procedure: LEFT HEART CATH;  Surgeon: [name], MD;  Location: [facility] CATH LAB;  Service: Cardiovascular;  Laterality: N/A;  . PERCUTANEOUS CORONARY STENT INTERVENTION (PCI-S)  07/17/2014   Procedure: PERCUTANEOUS CORONARY STENT INTERVENTION (PCI-S);  Surgeon: [name], MD;  Location: [facility] CATH LAB;  Service: Cardiovascular;;  . RIGHT/LEFT HEART CATH AND CORONARY ANGIOGRAPHY N/A 10/03/2018   Procedure: RIGHT/LEFT HEART CATH AND CORONARY ANGIOGRAPHY;  Surgeon: [name], MD;  Location: [facility] CV LAB;  Service: Cardiovascular;  Laterality: N/A;     Allergies:  No Known Allergies   Social History:  reports that he has been smoking cigarettes. He has a 15.00 pack-year smoking history. He has never used smokeless tobacco. He reports current drug use. Drug: Cocaine. He reports that he does not drink alcohol.   Family History: Family History  Problem Relation Age of Onset  . Cirrhosis Father   . Heart attack Mother   . Heart attack Sister   . Heart failure Sister   . Heart attack Brother     Physical Exam: Vitals:   10/03/20 1945 10/03/20 2000 10/03/20 2015 10/03/20 2030  BP: (!) 127/106 103/81 97/69 98/75   Pulse: 95 82 96 92  Resp:   18  Temp:      TempSrc:      SpO2: 100% 96% 94% 94%  Weight:      Height:        Constitutional: Alert and awake, oriented x3, not in any acute distress. Eyes: PERLA, EOMI, irises appear normal, anicteric sclera,  ENMT: external ears and nose appear normal, lips appears normal, oropharynx mucosa, tongue, posterior pharynx appear normal  Neck: no masses, normal ROM, neck veins are distrended   CVS: regular rate and rhythm, pretibial pitting edema bilaterally, JVD noted Respiratory:  no wheezing, rales or rhonchi. Respiratory effort normal. No accessory muscle use.  Abdomen: soft  nontender, nondistended, normal bowel sounds   Musculoskeletal: : no cyanosis, clubbing or edema noted bilaterally  Neuro: Cranial nerves II-XII intact, strength, sensation, reflexes Psych: judgement and insight appear normal, stable mood and affect, mental status Skin: no rashes or lesions or ulcers, no induration or nodules   Data reviewed:  I have personally reviewed following labs and imaging studies Labs:  CBC: Recent Labs  Lab 10/03/20 1050  WBC 8.2  HGB 12.6*  HCT 38.8*  MCV 87.4  PLT 557    Basic Metabolic Panel: Recent Labs  Lab 10/03/20 1141  NA 138  K 4.0  CL 104  CO2 23  GLUCOSE 100*  BUN 37*  CREATININE 2.08*  CALCIUM 9.1   GFR Estimated Creatinine Clearance: 41.9 mL/min (A) (by C-G formula based on SCr of 2.08 mg/dL (H)). Liver Function Tests: Recent Labs  Lab 10/03/20 1141  AST 36  ALT 41  ALKPHOS 130*  BILITOT 1.3*  PROT 6.5  ALBUMIN 3.0*   Recent Labs  Lab 10/03/20 1141  LIPASE 38   No results for input(s): AMMONIA in the last 168 hours. Coagulation profile No results for input(s): INR, PROTIME in the last 168 hours.  Cardiac Enzymes: No results for input(s): CKTOTAL, CKMB, CKMBINDEX, TROPONINI in the last 168 hours. BNP: Invalid input(s): POCBNP CBG: No results for input(s): GLUCAP in the last 168 hours. D-Dimer No results for input(s): DDIMER in the last 72 hours. Hgb A1c No results for input(s): HGBA1C in the last 72 hours. Lipid Profile No results for input(s): CHOL, HDL, LDLCALC, TRIG, CHOLHDL, LDLDIRECT in the last 72 hours. Thyroid function studies No results for input(s): TSH, T4TOTAL, T3FREE, THYROIDAB in the last 72 hours.  Invalid input(s): FREET3 Anemia work up No results for input(s): VITAMINB12, FOLATE, FERRITIN, TIBC, IRON, RETICCTPCT in the last 72 hours. Urinalysis    Component Value Date/Time   COLORURINE YELLOW 10/03/2020 1600   APPEARANCEUR CLEAR 10/03/2020 1600   LABSPEC 1.021 10/03/2020 1600   PHURINE  5.0 10/03/2020 1600   GLUCOSEU NEGATIVE 10/03/2020 1600   HGBUR NEGATIVE 10/03/2020 1600   BILIRUBINUR NEGATIVE 10/03/2020 1600   KETONESUR NEGATIVE 10/03/2020 1600   PROTEINUR 100 (A) 10/03/2020 1600   NITRITE NEGATIVE 10/03/2020 1600   LEUKOCYTESUR NEGATIVE 10/03/2020 1600     Microbiology No results found for this or any previous visit (from the past 240 hour(s)).     Inpatient Medications:   Scheduled Meds: . furosemide  80 mg Intravenous Q12H   Continuous Infusions: . milrinone 0.25 mcg/kg/min (10/03/20 1900)     Radiological Exams on Admission: DG Chest Port 1 View  Result Date: 10/03/2020 CLINICAL DATA:  The patient was discharged from the hospital 09/22/2020 after admission for COVID-19 pneumonia. Abdominal pain today. EXAM: PORTABLE CHEST 1 VIEW COMPARISON:  Single-view of the chest 09/19/2020. PA and lateral chest 12/13/2017. FINDINGS: Small focus  of scar in right upper lobe noted. The lungs are otherwise clear. There is cardiomegaly. Aortic atherosclerosis. No pneumothorax or pleural effusion. No acute or focal bony abnormality. IMPRESSION: Cardiomegaly without acute disease. Electronically Signed   By: Inge Rise M.D.   On: 10/03/2020 12:10   [place] EKG SITE RITE  Result Date: 10/03/2020 If Site Rite image not attached, placement could not be confirmed due to current cardiac rhythm.   Impression/Recommendations  1. Acute on chronic systolic CHF  - Patient with EF <20% in April 2021 presents with hypervolemia in setting of cocaine abuse and medication non-compliance  - He is started on milrinone infusion and IV Lasix by cardiology   2. Acute kidney injury superimposed on CKD IIIa  - SCr is 2.08 on admission, up from 1.61 last month  - He is hypervolemic and being diuresed  - Renally-dose medications, avoid nephrotoxins, monitor electrolytes and renal function while diuresing   3. CAD  - No anginal complaints  - Continue ASA and statin    4. Cocaine  abuse  - Patient admits to ongoing use  - Counseled, TOC consulted for possible resources   5. Recent COVID-19 infection  - He is more than 10 days out from COVID diagnosis, only had mild respiratory sxs which have essentially resolved and isolation no longer indicated, does not need to be retested    Thank you for this consultation.  Our [facility] hospitalist team will follow the patient with you.   Time Spent: 57 minutes.   Ilene Qua Roniqua Kintz M.D. [facility] 10/03/2020, 9:13 PM

## 2020-10-03 NOTE — ED Notes (Signed)
Dr. Myna Hidalgo admitting MD  advised RN to discontinue airborne precaution / will not need to swab patient for Covid .

## 2020-10-03 NOTE — ED Notes (Signed)
Admitting MD notified on patient's elevated Troponin . Patient denies chest pain /respirations unlabored .

## 2020-10-03 NOTE — ED Provider Notes (Signed)
Care assumed from Tyler Wong, [place]. See her note for full H&P.  Per her note, "Tyler Wong is a 54 y.o. male with past medical history of CAD, hypertension, COPD, CHF with EF of less than 25%, recent COVID-19 virus, hepatitis C, presenting to the emergency department via EMS my postop with complaint of abdominal pain.  Patient states he has not had his home medications since he was discharged from the hospital because his wife kicked him out of the house.  His medications are at his home.  He was recently admitted in September for acute respiratory failure in the setting of COVID-19 virus, and discharged on the 25th.  He states he began feeling his abdomen becomes distended and painful, he was having difficulty taking a deep breath.  His abdominal distention felt similar to when he was experiencing too much fluid from his CHF.  He endorses worsening lower leg swelling as well.  He denies shortness of breath or chest pain.  His abdominal pain has improved since EMS has brought him here to the ED.  No new fevers or chills.  Patient has history of cocaine abuse.  States he has not used cocaine in "48 hours.""  Physical Exam  BP (!) 107/96   Pulse 70   Temp 97.6 F (36.4 C) (Oral)   Resp 20   Ht 5\' 10"  (1.778 m)   Wt 74 kg   SpO2 100%   BMI 23.41 kg/m   Physical Exam Constitutional:      General: He is not in acute distress.    Appearance: He is well-developed.  Eyes:     Conjunctiva/sclera: Conjunctivae normal.  Cardiovascular:     Rate and Rhythm: Normal rate and regular rhythm.  Pulmonary:     Comments: Tachypneic, speaking in short sentences Abdominal:     General: Bowel sounds are normal.     Palpations: Abdomen is soft.     Tenderness: There is no guarding or rebound.     Comments: No focal ttp on exam, no distension  Musculoskeletal:     Right lower leg: Edema present.     Left lower leg: Edema present.  Skin:    General: Skin is warm and dry.  Neurological:     Mental  Status: He is alert and oriented to person, place, and time.       ED Course/Procedures     Procedures  Results for orders placed or performed during the hospital encounter of 10/03/20  CBC  Result Value Ref Range   WBC 8.2 4.0 - 10.5 K/uL   RBC 4.44 4.22 - 5.81 MIL/uL   Hemoglobin 12.6 (L) 13.0 - 17.0 g/dL   HCT 38.8 (L) 39 - 52 %   MCV 87.4 80.0 - 100.0 fL   MCH 28.4 26.0 - 34.0 pg   MCHC 32.5 30.0 - 36.0 g/dL   RDW 17.4 (H) 11.5 - 15.5 %   Platelets 296 150 - 400 K/uL   nRBC 0.0 0.0 - 0.2 %  Urinalysis, Routine w reflex microscopic  Result Value Ref Range   Color, Urine YELLOW YELLOW   APPearance CLEAR CLEAR   Specific Gravity, Urine 1.021 1.005 - 1.030   pH 5.0 5.0 - 8.0   Glucose, UA NEGATIVE NEGATIVE mg/dL   Hgb urine dipstick NEGATIVE NEGATIVE   Bilirubin Urine NEGATIVE NEGATIVE   Ketones, ur NEGATIVE NEGATIVE mg/dL   Protein, ur 100 (A) NEGATIVE mg/dL   Nitrite NEGATIVE NEGATIVE   Leukocytes,Ua NEGATIVE NEGATIVE  RBC / HPF 0-5 0 - 5 RBC/hpf   WBC, UA 0-5 0 - 5 WBC/hpf   Bacteria, UA NONE SEEN NONE SEEN   Mucus PRESENT   Brain natriuretic peptide  Result Value Ref Range   B Natriuretic Peptide 2,190.9 (H) 0.0 - 100.0 pg/mL  Comprehensive metabolic panel  Result Value Ref Range   Sodium 138 135 - 145 mmol/L   Potassium 4.0 3.5 - 5.1 mmol/L   Chloride 104 98 - 111 mmol/L   CO2 23 22 - 32 mmol/L   Glucose, Bld 100 (H) 70 - 99 mg/dL   BUN 37 (H) 6 - 20 mg/dL   Creatinine, Ser 2.08 (H) 0.61 - 1.24 mg/dL   Calcium 9.1 8.9 - 10.3 mg/dL   Total Protein 6.5 6.5 - 8.1 g/dL   Albumin 3.0 (L) 3.5 - 5.0 g/dL   AST 36 15 - 41 U/L   ALT 41 0 - 44 U/L   Alkaline Phosphatase 130 (H) 38 - 126 U/L   Total Bilirubin 1.3 (H) 0.3 - 1.2 mg/dL   GFR calc non Af Amer 35 (L) >60 mL/min   Anion gap 11 5 - 15  Lipase, blood  Result Value Ref Range   Lipase 38 11 - 51 U/L   DG Chest Port 1 View  Result Date: 10/03/2020 CLINICAL DATA:  The patient was discharged from the  hospital 09/22/2020 after admission for COVID-19 pneumonia. Abdominal pain today. EXAM: PORTABLE CHEST 1 VIEW COMPARISON:  Single-view of the chest 09/19/2020. PA and lateral chest 12/13/2017. FINDINGS: Small focus of scar in right upper lobe noted. The lungs are otherwise clear. There is cardiomegaly. Aortic atherosclerosis. No pneumothorax or pleural effusion. No acute or focal bony abnormality. IMPRESSION: Cardiomegaly without acute disease. Electronically Signed   By: Inge Rise M.D.   On: 10/03/2020 12:10   DG Chest Port 1 View  Result Date: 09/19/2020 CLINICAL DATA:  Shortness of breath EXAM: PORTABLE CHEST 1 VIEW COMPARISON:  Chest x-ray 06/21/2020, CT chest 04/09/2020 FINDINGS: The heart size and mediastinal contours are unchanged. Similar-appearing cardiomegaly. Coronary artery calcifications. No focal consolidation. No pulmonary edema. No pleural effusion. No pneumothorax. No acute osseous abnormality. IMPRESSION: No active cardiopulmonary disease. Electronically Signed   By: Iven Finn M.D.   On: 09/19/2020 21:11   [place] EKG SITE RITE  Result Date: 10/03/2020 If Site Rite image not attached, placement could not be confirmed due to current cardiac rhythm.  CRITICAL CARE Performed by: Rodney Booze   Total critical care time: 33 minutes  Critical care time was exclusive of separately billable procedures and treating other patients.  Critical care was necessary to treat or prevent imminent or life-threatening deterioration.  Critical care was time spent personally by me on the following activities: development of treatment plan with patient and/or surrogate as well as nursing, discussions with consultants, evaluation of patient's response to treatment, examination of patient, obtaining history from patient or surrogate, ordering and performing treatments and interventions, ordering and review of laboratory studies, ordering and review of radiographic studies, pulse oximetry and  re-evaluation of patient's condition.   MDM   Briefly, patient is a 54 year old male recently admitted with Covid.  Has a history of CHF with an EF of less than 25%.  After discharge he did not take any of his medications and presented today with complaints of abdominal distention.  On exam he appears to be fluid overloaded.  He was initially mildly tachypneic however over the course of his ED  stay he has become more tachypneic and short of breath.  He is placed on oxygen for comfort though his sats are not low.  Reviewed/interpreted labs CBC is without leukocytosis, mild anemia present CMP with elevated BUN at 37, elevated creatinine at 2 which is worse from prior, bilirubin mildly elevated at 1.3, LFTs are normal.  Electrolytes are grossly normal. Lipase is negative BNP is significant elevated at 2200 Trop and dig level added at shift change, pending on admission  EKG with sinus tach, paired PVCs, biatrial enlargement, anterior and inferior infarct which are old, similar to prior EKG in September/2021  Chest x-ray reviewed/interpreted, and showed cardiomegaly without acute disease  Pt with what appears to be decompensated heart failure. IV lasix given in the ED.  4:35 PM Paged Cardiology.  5:30 PM Have not heard from cardiology. Reported to secretary who will repage them  6:05 PM CONSULT with Dr. Sallyanne Kuster who will come see the patient. Recommends starting mirinone at 0.25  Cards will admit patient.      Rodney Booze, PA-C 10/03/20 1846    Quintella Reichert, MD 10/05/20 0006

## 2020-10-03 NOTE — ED Notes (Signed)
O2 sats are in 80's% O2 turned up to 5L/m/[place], pt is comfortable - resting in the bed.

## 2020-10-03 NOTE — ED Notes (Signed)
Admitting MD paged by secretary for RN to update on elevated Troponin result.

## 2020-10-04 ENCOUNTER — Inpatient Hospital Stay (HOSPITAL_COMMUNITY): Payer: Medicaid Other

## 2020-10-04 DIAGNOSIS — I5023 Acute on chronic systolic (congestive) heart failure: Secondary | ICD-10-CM

## 2020-10-04 DIAGNOSIS — I2510 Atherosclerotic heart disease of native coronary artery without angina pectoris: Secondary | ICD-10-CM | POA: Diagnosis not present

## 2020-10-04 LAB — BASIC METABOLIC PANEL
Anion gap: 10 (ref 5–15)
BUN: 32 mg/dL — ABNORMAL HIGH (ref 6–20)
CO2: 26 mmol/L (ref 22–32)
Calcium: 8.5 mg/dL — ABNORMAL LOW (ref 8.9–10.3)
Chloride: 100 mmol/L (ref 98–111)
Creatinine, Ser: 1.98 mg/dL — ABNORMAL HIGH (ref 0.61–1.24)
GFR calc non Af Amer: 37 mL/min — ABNORMAL LOW (ref >60)
Glucose, Bld: 146 mg/dL — ABNORMAL HIGH (ref 70–99)
Potassium: 4 mmol/L (ref 3.5–5.1)
Sodium: 136 mmol/L (ref 135–145)

## 2020-10-04 LAB — ECHOCARDIOGRAM COMPLETE
Area-P 1/2: 5.02 cm2
Calc EF: -14.1 %
Height: 70 in
MV M vel: 4.6 m/s
MV Peak grad: 84.6 mmHg
Radius: 0.4 cm
S' Lateral: 5.9 cm
Single Plane A2C EF: 15.1 %
Single Plane A4C EF: -5.1 %
Weight: 2659.63 oz

## 2020-10-04 LAB — COOXEMETRY PANEL
Carboxyhemoglobin: 1.2 % (ref 0.5–1.5)
Methemoglobin: 1 % (ref 0.0–1.5)
O2 Saturation: 64.6 %
Total hemoglobin: 12.5 g/dL (ref 12.0–16.0)

## 2020-10-04 LAB — TSH: TSH: 0.443 u[IU]/mL (ref 0.350–4.500)

## 2020-10-04 MED ORDER — SENNA 8.6 MG PO TABS
1.0000 | ORAL_TABLET | Freq: Every day | ORAL | Status: DC
  Administered 2020-10-04 – 2020-10-09 (×6): 8.6 mg via ORAL
  Filled 2020-10-04 (×6): qty 1

## 2020-10-04 MED ORDER — SODIUM CHLORIDE 0.9% FLUSH: 10.0000 mL | INTRAVENOUS | Status: DC | PRN

## 2020-10-04 MED ORDER — DOCUSATE SODIUM 100 MG PO CAPS
100.0000 mg | ORAL_CAPSULE | Freq: Every day | ORAL | Status: DC
  Administered 2020-10-04 – 2020-10-09 (×6): 100 mg via ORAL
  Filled 2020-10-04 (×6): qty 1

## 2020-10-04 MED ORDER — CHLORHEXIDINE GLUCONATE CLOTH 2 % EX PADS
6.0000 | MEDICATED_PAD | Freq: Every day | CUTANEOUS | Status: DC
  Administered 2020-10-05 – 2020-10-08 (×3): 6 via TOPICAL

## 2020-10-04 MED ORDER — SODIUM CHLORIDE 0.9% FLUSH
10.0000 mL | Freq: Two times a day (BID) | INTRAVENOUS | Status: DC
  Administered 2020-10-04 – 2020-10-08 (×6): 10 mL

## 2020-10-04 NOTE — Plan of Care (Signed)

## 2020-10-04 NOTE — Progress Notes (Signed)
PROGRESS NOTE    Tyler Wong  FTD:[id number] DOB: 05-Jun-1966 DOA: 10/03/2020 PCP: [name], NP   Brief Narrative: 54 year old with past medical history significant for CAD, hypertension, cocaine abuse, chronic systolic heart failure ejection fraction 20% present complaining of abdominal distention, lower extremity edema and shortness of breath. He was recently diagnosed with COVID-19 14 days ago.  He no longer requires isolation for these.  He was found to be hypoxic oxygen saturation in the 90s, hypotensive systolic blood pressure in the 80s, creatinine 2.0 elevated BNP troponin I 85, chest x-ray showed cardiomegaly.  Patient was admitted under cardiology service for heart failure exacerbation and milrinone infusion.     Assessment & Plan:   Active Problems:   Coronary artery disease involving native heart   Acute systolic HF (heart failure) (HCC)   Acute on chronic systolic CHF (congestive heart failure) ([place])  1-Acute on chronic systolic heart failure; Ejection fraction less than 20% April 2021. Continue with IV Lasix and milrinone infusion per cardiology  2-AKI on CKD stage IIIa: Fattening up to 2.0 on admission.  1.6 last month. Related to cardiorenal syndrome. Continue to monitor  3-CAD: Continue with aspirin and statin.  Cardiology following  4-cocaine abuse: Counseling provided  5-Covid 19 infection: Off of isolation. 6-bronchitis, start nebulizers   Estimated body mass index is 23.85 kg/m as calculated from the following:   Height as of this encounter: 5\' 10"  (1.778 m).   Weight as of this encounter: 75.4 kg.   DVT prophylaxis: Heparin Code Status: Full code Family Communication: Care discussed with patient Disposition Plan:  Status is: Inpatient  Remains inpatient appropriate because:IV treatments appropriate due to intensity of illness or inability to take PO   Dispo: The patient is from: Home              Anticipated d/c is to: Home               Anticipated d/c date is: 3 days              Patient currently is not medically stable to d/c.        Procedures:   Echo  Antimicrobials:  None  Subjective: Report feeling a little bit better, denies chest pain, reports shortness of breath.  Objective: Vitals:   10/03/20 2231 10/04/20 0015 10/04/20 0414 10/04/20 0714  BP: (!) 112/92 105/81 (!) 94/55 92/60  Pulse: 80 (!) 108 94   Resp: (!) 24 20 20 15   Temp: 98.5 F (36.9 C) 98 F (36.7 C) 98.2 F (36.8 C) 98.6 F (37 C)  TempSrc: Oral Oral Oral Oral  SpO2: 97% 96% 91% 92%  Weight: 75.4 kg     Height: 5\' 10"  (1.778 m)       Intake/Output Summary (Last 24 hours) at 10/04/2020 0745 Last data filed at 10/04/2020 0429 Gross per 24 hour  Intake 760.88 ml  Output 3775 ml  Net -3014.12 ml   Filed Weights   10/03/20 1038 10/03/20 2231  Weight: 74 kg 75.4 kg    Examination:  General exam: Appears calm and comfortable  Respiratory system: Bilateral rhonchorous wheezing Cardiovascular system: S1 & S2 heard, RRR Gastrointestinal system: Abdomen is nondistended, soft and nontender. No organomegaly or masses felt. Normal bowel sounds heard. Central nervous system: Alert and oriented. No focal neurological deficits. Extremities: Symmetric 5 x 5 power.  Data Reviewed: I have personally reviewed following labs and imaging studies  CBC: Recent Labs  Lab 10/03/20 1050 10/03/20 2250  WBC 8.2 8.4  HGB 12.6* 12.7*  HCT 38.8* 38.7*  MCV 87.4 86.8  PLT 296 497   Basic Metabolic Panel: Recent Labs  Lab 10/03/20 1141 10/03/20 2051 10/03/20 2250 10/04/20 0027  NA 138  --   --  136  K 4.0  --   --  4.0  CL 104  --   --  100  CO2 23  --   --  26  GLUCOSE 100*  --   --  146*  BUN 37*  --   --  32*  CREATININE 2.08*  --  2.04* 1.98*  CALCIUM 9.1  --   --  8.5*  MG  --  2.0  --   --    GFR: Estimated Creatinine Clearance: 44 mL/min (A) (by C-G formula based on SCr of 1.98 mg/dL (H)). Liver Function  Tests: Recent Labs  Lab 10/03/20 1141 10/03/20 2051  AST 36 31  ALT 41 39  ALKPHOS 130* 110  BILITOT 1.3* 0.8  PROT 6.5 5.7*  ALBUMIN 3.0* 2.6*   Recent Labs  Lab 10/03/20 1141  LIPASE 38   No results for input(s): AMMONIA in the last 168 hours. Coagulation Profile: No results for input(s): INR, PROTIME in the last 168 hours. Cardiac Enzymes: No results for input(s): CKTOTAL, CKMB, CKMBINDEX, TROPONINI in the last 168 hours. BNP (last 3 results) Recent Labs    01/30/20 1645  PROBNP 1,759*   HbA1C: No results for input(s): HGBA1C in the last 72 hours. CBG: No results for input(s): GLUCAP in the last 168 hours. Lipid Profile: No results for input(s): CHOL, HDL, LDLCALC, TRIG, CHOLHDL, LDLDIRECT in the last 72 hours. Thyroid Function Tests: Recent Labs    10/03/20 2250  TSH 0.443   Anemia Panel: No results for input(s): VITAMINB12, FOLATE, FERRITIN, TIBC, IRON, RETICCTPCT in the last 72 hours. Sepsis Labs: No results for input(s): PROCALCITON, LATICACIDVEN in the last 168 hours.  No results found for this or any previous visit (from the past 240 hour(s)).       Radiology Studies: DG Chest Port 1 View  Result Date: 10/03/2020 CLINICAL DATA:  The patient was discharged from the hospital 09/22/2020 after admission for COVID-19 pneumonia. Abdominal pain today. EXAM: PORTABLE CHEST 1 VIEW COMPARISON:  Single-view of the chest 09/19/2020. PA and lateral chest 12/13/2017. FINDINGS: Small focus of scar in right upper lobe noted. The lungs are otherwise clear. There is cardiomegaly. Aortic atherosclerosis. No pneumothorax or pleural effusion. No acute or focal bony abnormality. IMPRESSION: Cardiomegaly without acute disease. Electronically Signed   By: Inge Rise M.D.   On: 10/03/2020 12:10   [place] EKG SITE RITE  Result Date: 10/03/2020 If Site Rite image not attached, placement could not be confirmed due to current cardiac rhythm.       Scheduled Meds:   aspirin EC  81 mg Oral Daily   atorvastatin  80 mg Oral Daily   digoxin  0.125 mg Oral Daily   furosemide  80 mg Intravenous Q12H   heparin  5,000 Units Subcutaneous Q8H   isosorbide-hydrALAZINE  1 tablet Oral TID   multivitamin with minerals   Oral Daily   pantoprazole  40 mg Oral Daily   potassium chloride SA  20 mEq Oral Daily   sodium chloride flush  3 mL Intravenous Q12H   Continuous Infusions:  sodium chloride     milrinone 0.25 mcg/kg/min (10/03/20 1900)     LOS: 1 day    Time spent: 35 minutes.  Elmarie Shiley, MD [facility]   If 7PM-7AM, please contact night-coverage [email or web address]  10/04/2020, 7:45 AM

## 2020-10-04 NOTE — Plan of Care (Signed)
  Problem: Education: Goal: Knowledge of General Education information will improve Description: Including pain rating scale, medication(s)/side effects and non-pharmacologic comfort measures Outcome: Progressing   Problem: Health Behavior/Discharge Planning: Goal: Ability to manage health-related needs will improve Outcome: Progressing   Problem: Nutrition: Goal: Adequate nutrition will be maintained Outcome: Progressing   Problem: Elimination: Goal: Will not experience complications related to bowel motility Outcome: Progressing Goal: Will not experience complications related to urinary retention Outcome: Progressing   Problem: Elimination: Goal: Will not experience complications related to urinary retention Outcome: Progressing   Problem: Pain Managment: Goal: General experience of comfort will improve Outcome: Progressing

## 2020-10-04 NOTE — CV Procedure (Signed)
2D echo attempted, but patient in sterile procedure. Will try later.

## 2020-10-04 NOTE — Progress Notes (Signed)
  Echocardiogram 2D Echocardiogram has been performed.  Bobbye Charleston 10/04/2020, 10:36 AM

## 2020-10-04 NOTE — Progress Notes (Addendum)
Advanced Heart Failure Rounding Note  PCP-Cardiologist: Shelva Majestic, MD  [facility]: Dr. 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 Also w/ h/o poor compliance. Previous referred to paramedicine but not fully cooperative. Recently diagnosed w/ COVID 09/19/20.   Admitted overnight for [name] and presumed low output. Started on empiric milrinone 0.25 + IV lasix 80 q12. UDS + for cocaine.   PICC line just got placed. Waiting on co-ox and CVP set up.   - 3.8L in UOP overnight.   Scr improving, down from 2.08>>1.98. BP soft but stable.   Sinus tach 110s on tele, 6 beats NSVT. K 4.0   If he is feeling a bit better. No current dyspnea sitting up. Unable to lay flat. Continues w/ orthopnea. No CP. Complains of constipation.    Objective:   Weight Range: 75.4 kg Body mass index is 23.85 kg/m.   Vital Signs:   Temp:  [98 F (36.7 C)-98.6 F (37 C)] 98.6 F (37 C) (10/08 0714) Pulse Rate:  [36-125] 125 (10/08 1033) Resp:  [11-40] 20 (10/08 1033) BP: (84-136)/(32-106) 101/83 (10/08 1033) SpO2:  [87 %-100 %] 96 % (10/08 1033) Weight:  [75.4 kg] 75.4 kg (10/07 2231)    Weight change: Filed Weights   10/03/20 1038 10/03/20 2231  Weight: 74 kg 75.4 kg    Intake/Output:   Intake/Output Summary (Last 24 hours) at 10/04/2020 1102 Last data filed at 10/04/2020 0817 Gross per 24 hour  Intake 760.88 ml  Output 4225 ml  Net -3464.12 ml      Physical Exam    General:  Thin/ fatigue appearing. No resp difficulty HEENT: Normal Neck: Supple. JVP ~10 cm . Carotids 2+ bilat; no bruits. No lymphadenopathy or thyromegaly appreciated. Cor: PMI nondisplaced. Regular rhythm, tachy rate. 3/6 MR murmur at apex Lungs: bibasilar crackles,  R>L Abdomen: Soft, nontender, nondistended. No hepatosplenomegaly. No bruits or masses. Good bowel sounds. Extremities: No cyanosis, clubbing, rash, edema + RUE PICC Neuro: Alert & orientedx3, cranial nerves grossly intact. moves all 4 extremities w/o difficulty. Affect pleasant   Telemetry   Sinus tach, low 100s, 6 beats NSVT   EKG    NSR PVCs 96 bpm   Labs    CBC Recent Labs    10/03/20 1050 10/03/20 2250  WBC 8.2 8.4  HGB 12.6* 12.7*  HCT 38.8* 38.7*  MCV 87.4 86.8  PLT 296 144   Basic Metabolic Panel Recent Labs    10/03/20 1141 10/03/20 1141 10/03/20 2051 10/03/20 2250 10/04/20 0027  NA 138  --   --   --  136  K 4.0  --   --   --  4.0  CL 104  --   --   --  100  CO2 23  --   --   --  26  GLUCOSE 100*  --   --   --  146*  BUN 37*  --   --   --  32*  CREATININE 2.08*   < >  --  2.04* 1.98*  CALCIUM 9.1  --   --   --  8.5*  MG  --   --  2.0  --   --    < > =  values in this interval not displayed.   Liver Function Tests Recent Labs    10/03/20 1141 10/03/20 2051  AST 36 31  ALT 41 39  ALKPHOS 130* 110  BILITOT 1.3* 0.8  PROT 6.5 5.7*  ALBUMIN 3.0* 2.6*   Recent Labs    10/03/20 1141  LIPASE 38   Cardiac Enzymes No results for input(s): CKTOTAL, CKMB, CKMBINDEX, TROPONINI in the last 72 hours.  BNP: BNP (last 3 results) Recent Labs    09/19/20 2058 09/21/20 0138 10/03/20 1222  BNP 478.3* 578.1* 2,190.9*    ProBNP (last 3 results) Recent Labs    01/30/20 1645  PROBNP 1,759*     D-Dimer No results for input(s): DDIMER in the last 72 hours. Hemoglobin A1C No results for input(s): HGBA1C in the last 72 hours. Fasting Lipid Panel No results for input(s): CHOL, HDL, LDLCALC, TRIG, CHOLHDL, LDLDIRECT in the last 72 hours. Thyroid Function Tests Recent Labs    10/03/20 2250  TSH 0.443    Other results:   Imaging    DG Chest Port 1 View  Result Date: 10/03/2020 CLINICAL DATA:  The patient was discharged from the hospital  09/22/2020 after admission for COVID-19 pneumonia. Abdominal pain today. EXAM: PORTABLE CHEST 1 VIEW COMPARISON:  Single-view of the chest 09/19/2020. PA and lateral chest 12/13/2017. FINDINGS: Small focus of scar in right upper lobe noted. The lungs are otherwise clear. There is cardiomegaly. Aortic atherosclerosis. No pneumothorax or pleural effusion. No acute or focal bony abnormality. IMPRESSION: Cardiomegaly without acute disease. Electronically Signed   By: Inge Rise M.D.   On: 10/03/2020 12:10   [place] EKG SITE RITE  Result Date: 10/03/2020 If Site Rite image not attached, placement could not be confirmed due to current cardiac rhythm.     Medications:     Scheduled Medications: . aspirin EC  81 mg Oral Daily  . atorvastatin  80 mg Oral Daily  . Chlorhexidine Gluconate Cloth  6 each Topical Daily  . digoxin  0.125 mg Oral Daily  . furosemide  80 mg Intravenous Q12H  . heparin  5,000 Units Subcutaneous Q8H  . multivitamin with minerals   Oral Daily  . pantoprazole  40 mg Oral Daily  . potassium chloride SA  20 mEq Oral Daily  . sodium chloride flush  10-40 mL Intracatheter Q12H  . sodium chloride flush  3 mL Intravenous Q12H     Infusions: . sodium chloride    . milrinone 0.25 mcg/kg/min (10/03/20 1900)     PRN Medications:  sodium chloride, acetaminophen, albuterol, nitroGLYCERIN, ondansetron (ZOFRAN) IV, sodium chloride flush, sodium chloride flush   Assessment/Plan   1. Acute on chronic Systolic HF w/ Presumed Low-output: Echo in 2015 with EF 40-45%, thought to be ischemic cardiomyopathy. Cath in 10/19 showed 90% ostial OM1 and 70% PLV, no interventional target. Suspect mixed ischemic/nonischemic cardiomyopathy. 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  Multiple re-admits for cardiogenic shock in the last 6 months requiring milrinone. Unfortunately, not a candidate for advanced therapies or home milrinone with active use of cocaine. - He has been started on empiric milrinone this admit. Initial SCr up to 2.08, improving 1.98 today. - PICC line just placed. Will check co-ox and CVPs. Will continue milrinone at 0.25 until adequately diuresed, but as noted above, not a  candidate for home inotropes.  - Continue IV Lasix 80 bid  - hold digoxin w/ AKI  - Home HF regimen held on admit given hypotension and AKI, will try to resume back once AKI resolves   2. CAD: H/o anterior STEMI with DES to LAD in 7/15.As above, cath in 10/19 with 90% ostial OM1 and 70% PLV, no interventional target.  - no chest pain  -ContinueASA 81 and atorvastatin 80 mg daily.   3. Cocaine abuse: continues to use cocaine. UDS +.  Abstinence imperative   4. Active smoker: Counseled to quit.  5. AKI:  - Scr 2.08 on admit, baseline ~1.6 - AKI 2/2 low ouput/ CHF - improving w/ inotrope and diuretics, 1.98 today - follow BMP   6. COVID Infection  - tested + 09/19/20 - no active symptoms   7. Constipation - add colace and senokot   8. Mitral Regurgitation - moderate on last echo 4/21, likely functional  - 3/6 murmur at apex - repeat echo pending    Length of Stay: [place], PA-C  10/04/2020, 11:02 AM  Advanced Heart Failure Team Pager [phone] (M-F; 7a - 4p)  Please contact [facility] for night-coverage after hours (4p -7a ) and weekends on [email or web address]  Patient seen and examined with the above-signed Advanced Practice Provider and/or Housestaff. I personally reviewed laboratory data, imaging studies and relevant notes. I independently examined the patient and formulated the important aspects of the plan. I have edited the note to reflect any of my changes or salient points. I have personally discussed the plan with the patient and/or  family.  55 y/o male with polysubstance abuse and severe systolic HF with mixed ischemic/NICM.  Readmitted last night with recurrent low output HF/shock in setting of ongoing cocaine abuse.  Started on milrinone. Co-ox and renal function now improving. Diuresing. Echo today EF 20-25%  General:  Lying in bed. No resp difficulty HEENT: normal Neck: supple. JVP to jaw  Carotids 2+ bilat; no bruits. No lymphadenopathy or thryomegaly appreciated. Cor: PMI nondisplaced. Regular rate & rhythm. +s33/6 MR Lungs: clear Abdomen: soft, nontender, nondistended. No hepatosplenomegaly. No bruits or masses. Good bowel sounds. Extremities: no cyanosis, clubbing, rash, edema Neuro: alert & orientedx3, cranial nerves grossly intact. moves all 4 extremities w/o difficulty. Affect pleasant  Will continue IV diuresis and milrinone at least one more day. Given ongoing substance abuse he is not candidate for home inotropes or other advanced therapies. Need to involve Palliative Care.   Glori Bickers, MD  6:23 PM

## 2020-10-04 NOTE — Progress Notes (Signed)
Peripherally Inserted Central Catheter Placement  The IV Nurse has discussed with the patient and/or persons authorized to consent for the patient, the purpose of this procedure and the potential benefits and risks involved with this procedure.  The benefits include less needle sticks, lab draws from the catheter, and the patient may be discharged home with the catheter. Risks include, but not limited to, infection, bleeding, blood clot (thrombus formation), and puncture of an artery; nerve damage and irregular heartbeat and possibility to perform a PICC exchange if needed/ordered by physician.  Alternatives to this procedure were also discussed.  Bard Power PICC patient education guide, fact sheet on infection prevention and patient information card has been provided to patient /or left at bedside.    PICC Placement Documentation  PICC Double Lumen 10/04/20 PICC Right Brachial 42 cm 2 cm (Active)  Indication for Insertion or Continuance of Line Chronic illness with exacerbations (CF, Sickle Cell, etc.) 10/04/20 0900  Exposed Catheter (cm) 2 cm 10/04/20 0900  Site Assessment Clean;Dry;Intact 10/04/20 0900  Lumen #1 Status Flushed;Saline locked;Blood return noted 10/04/20 0900  Lumen #2 Status Flushed;Saline locked;Blood return noted 10/04/20 0900  Dressing Type Transparent;Securing device 10/04/20 0900  Dressing Status Clean;Dry;Intact 10/04/20 0900  Antimicrobial disc in place? Yes 10/04/20 0900  Line Care Connections checked and tightened 10/04/20 0900  Dressing Intervention New dressing 10/04/20 0900  Dressing Change Due 10/11/20 10/04/20 0900       Holley Bouche Renee 10/04/2020, 9:51 AM

## 2020-10-05 DIAGNOSIS — I2510 Atherosclerotic heart disease of native coronary artery without angina pectoris: Secondary | ICD-10-CM | POA: Diagnosis not present

## 2020-10-05 DIAGNOSIS — I5023 Acute on chronic systolic (congestive) heart failure: Secondary | ICD-10-CM | POA: Diagnosis not present

## 2020-10-05 LAB — BASIC METABOLIC PANEL
Anion gap: 9 (ref 5–15)
BUN: 27 mg/dL — ABNORMAL HIGH (ref 6–20)
CO2: 32 mmol/L (ref 22–32)
Calcium: 8.6 mg/dL — ABNORMAL LOW (ref 8.9–10.3)
Chloride: 98 mmol/L (ref 98–111)
Creatinine, Ser: 1.85 mg/dL — ABNORMAL HIGH (ref 0.61–1.24)
GFR, Estimated: 40 mL/min — ABNORMAL LOW (ref >60)
Glucose, Bld: 128 mg/dL — ABNORMAL HIGH (ref 70–99)
Potassium: 3.6 mmol/L (ref 3.5–5.1)
Sodium: 139 mmol/L (ref 135–145)

## 2020-10-05 LAB — CBC
HCT: 38.2 % — ABNORMAL LOW (ref 39.0–52.0)
Hemoglobin: 12.5 g/dL — ABNORMAL LOW (ref 13.0–17.0)
MCH: 28.7 pg (ref 26.0–34.0)
MCHC: 32.7 g/dL (ref 30.0–36.0)
MCV: 87.6 fL (ref 80.0–100.0)
Platelets: 241 10*3/uL (ref 150–400)
RBC: 4.36 MIL/uL (ref 4.22–5.81)
RDW: 17 % — ABNORMAL HIGH (ref 11.5–15.5)
WBC: 9 10*3/uL (ref 4.0–10.5)
nRBC: 0 % (ref 0.0–0.2)

## 2020-10-05 LAB — COOXEMETRY PANEL
Carboxyhemoglobin: 1.2 % (ref 0.5–1.5)
Methemoglobin: 1.2 % (ref 0.0–1.5)
O2 Saturation: 51.4 %
Total hemoglobin: 12.6 g/dL (ref 12.0–16.0)

## 2020-10-05 LAB — MAGNESIUM: Magnesium: 1.5 mg/dL — ABNORMAL LOW (ref 1.7–2.4)

## 2020-10-05 MED ORDER — ALUM & MAG HYDROXIDE-SIMETH 200-200-20 MG/5ML PO SUSP
30.0000 mL | ORAL | Status: DC | PRN
  Administered 2020-10-05: 30 mL via ORAL
  Filled 2020-10-05: qty 30

## 2020-10-05 MED ORDER — POTASSIUM CHLORIDE CRYS ER 20 MEQ PO TBCR: 40.0000 meq | EXTENDED_RELEASE_TABLET | Freq: Once | ORAL | Status: DC

## 2020-10-05 MED ORDER — MAGNESIUM SULFATE 4 GM/100ML IV SOLN
4.0000 g | Freq: Once | INTRAVENOUS | Status: AC
  Administered 2020-10-05: 4 g via INTRAVENOUS
  Filled 2020-10-05: qty 100

## 2020-10-05 MED ORDER — POTASSIUM CHLORIDE CRYS ER 20 MEQ PO TBCR
40.0000 meq | EXTENDED_RELEASE_TABLET | Freq: Once | ORAL | Status: AC
  Administered 2020-10-05: 40 meq via ORAL
  Filled 2020-10-05: qty 2

## 2020-10-05 MED ORDER — DIGOXIN 125 MCG PO TABS
0.1250 mg | ORAL_TABLET | Freq: Every day | ORAL | Status: DC
  Administered 2020-10-05 – 2020-10-09 (×5): 0.125 mg via ORAL
  Filled 2020-10-05 (×5): qty 1

## 2020-10-05 MED ORDER — MAGNESIUM SULFATE 50 % IJ SOLN
3.0000 g | Freq: Once | INTRAVENOUS | Status: DC
  Filled 2020-10-05: qty 6

## 2020-10-05 NOTE — Plan of Care (Signed)

## 2020-10-05 NOTE — Progress Notes (Signed)
Advanced Heart Failure Rounding Note  PCP-Cardiologist: Shelva Majestic, MD  [facility]: Dr. 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 Also w/ h/o poor compliance. Previous referred to paramedicine but not fully cooperative. Recently diagnosed w/ COVID 09/19/20.   Remains on milrinone. Sitting up eating breakfast.   Denies SOB, orthopnea or PND. Co-ox 51% Creatinine 2.0 -> 1.8. CVP not set up.   Objective:   Weight Range: 72.8 kg Body mass index is 23.03 kg/m.   Vital Signs:   Temp:  [97.5 F (36.4 C)-99 F (37.2 C)] 98.2 F (36.8 C) (10/09 0758) Pulse Rate:  [57-111] 94 (10/09 0758) Resp:  [17-21] 19 (10/09 0758) BP: (85-114)/(66-92) 85/73 (10/09 0758) SpO2:  [93 %-97 %] 94 % (10/09 0323) Weight:  [72.8 kg] 72.8 kg (10/09 0542)    Weight change: Filed Weights   10/03/20 1038 10/03/20 2231 10/05/20 0542  Weight: 74 kg 75.4 kg 72.8 kg    Intake/Output:   Intake/Output Summary (Last 24 hours) at 10/05/2020 0801 Last data filed at 10/05/2020 0700 Gross per 24 hour  Intake 960.52 ml  Output 4925 ml  Net -3964.48 ml      Physical Exam    General:  Sitting up on side of bed . No resp difficulty HEENT: normal Neck: supple. JVP 5-6 Carotids 2+ bilat; no bruits. No lymphadenopathy or thryomegaly appreciated. Cor: PMI nondisplaced. Regular rate & rhythm. No rubs, gallops or murmurs. Lungs: clear Abdomen: soft, nontender, nondistended. No hepatosplenomegaly. No bruits or masses. Good bowel sounds. Extremities: no cyanosis, clubbing, rash, edema Neuro: alert & orientedx3, cranial nerves grossly intact. moves all 4 extremities w/o difficulty. Affect pleasant   Telemetry   Sinus 90s Personally reviewed  Labs     CBC Recent Labs    10/03/20 2250 10/05/20 0410  WBC 8.4 9.0  HGB 12.7* 12.5*  HCT 38.7* 38.2*  MCV 86.8 87.6  PLT 263 637   Basic Metabolic Panel Recent Labs    10/03/20 2051 10/03/20 2250 10/04/20 0027 10/05/20 0410  NA  --   --  136 139  K  --   --  4.0 3.6  CL  --   --  100 98  CO2  --   --  26 32  GLUCOSE  --   --  146* 128*  BUN  --   --  32* 27*  CREATININE  --    < > 1.98* 1.85*  CALCIUM  --   --  8.5* 8.6*  MG 2.0  --   --  1.5*   < > = values in this interval not displayed.   Liver Function Tests Recent Labs    10/03/20 1141 10/03/20 2051  AST 36 31  ALT 41 39  ALKPHOS 130* 110  BILITOT 1.3* 0.8  PROT 6.5 5.7*  ALBUMIN 3.0* 2.6*   Recent Labs    10/03/20 1141  LIPASE 38   Cardiac Enzymes No results for input(s): CKTOTAL, CKMB, CKMBINDEX, TROPONINI in the last 72 hours.  BNP: BNP (last 3 results) Recent Labs    09/19/20 2058 09/21/20 0138 10/03/20 1222  BNP 478.3* 578.1* 2,190.9*    ProBNP (last 3 results) Recent Labs    01/30/20 [place]*  D-Dimer No results for input(s): DDIMER in the last 72 hours. Hemoglobin A1C No results for input(s): HGBA1C in the last 72 hours. Fasting Lipid Panel No results for input(s): CHOL, HDL, LDLCALC, TRIG, CHOLHDL, LDLDIRECT in the last 72 hours. Thyroid Function Tests Recent Labs    10/03/20 2250  TSH 0.443    Other results:   Imaging    ECHOCARDIOGRAM COMPLETE  Result Date: 10/04/2020    ECHOCARDIOGRAM REPORT   Patient Name:   Tyler Wong Date of Exam: 10/04/2020 Medical Rec #:  [id number]   Height:       70.0 in Accession #:    [id number]  Weight:       166.2 lb Date of Birth:  02-08-1966  BSA:          1.930 m Patient Age:    71 years    BP:           92/60 mmHg Patient Gender: M           HR:           109 bpm. Exam Location:  Inpatient Procedure: 2D Echo, 3D Echo, Cardiac Doppler and Color Doppler Indications:    I50.40* Unspecified combined systolic (congestive) and  diastolic                 (congestive) heart failure  History:        Patient has prior history of Echocardiogram examinations. CHF,                 Previous Myocardial Infarction and CAD, Abnormal ECG,                 Signs/Symptoms:Chest Pain; Risk Factors:Current Smoker and                 Hypertension. Cardiogenic shock. Cocaine use. COvid 19 infection                 14 days ago.  Sonographer:    Roseanna Rainbow RDCS Referring Phys: [place] Comments: Patient moving throughout exam. IMPRESSIONS  1. 3 D images of the LV were obtained. . Left ventricular ejection fraction, by estimation, is 20 to 25%. The left ventricle has severely decreased function. The left ventricle demonstrates global hypokinesis. The left ventricular internal cavity size was moderately dilated. There is mild left ventricular hypertrophy. Left ventricular diastolic parameters are consistent with Grade II diastolic dysfunction (pseudonormalization).  2. Right ventricular systolic function is low normal. The right ventricular size is normal. There is moderately elevated pulmonary artery systolic pressure. The estimated right ventricular systolic pressure is 82.9 mmHg.  3. Left atrial size was moderately dilated.  4. Right atrial size was severely dilated.  5. The mitral valve is grossly normal. Moderate to severe mitral valve regurgitation. Mild mitral stenosis.  6. Tricuspid valve regurgitation is moderate to severe.  7. The aortic valve is normal in structure. Aortic valve regurgitation is not visualized. No aortic stenosis is present. FINDINGS  Left Ventricle: 3 D images of the LV were obtained. Left ventricular ejection fraction, by estimation, is 20 to 25%. The left ventricle has severely decreased function. The left ventricle demonstrates global hypokinesis. The left ventricular internal cavity size was moderately dilated. There is mild left ventricular hypertrophy. Left ventricular diastolic parameters are consistent  with Grade II diastolic dysfunction (pseudonormalization). Right Ventricle: The right ventricular size is normal. No increase in right ventricular wall thickness. Right ventricular systolic function is low normal. There  is moderately elevated pulmonary artery systolic pressure. The tricuspid regurgitant velocity  is 3.23 m/s, and with an assumed right atrial pressure of 15 mmHg, the estimated right ventricular systolic pressure is 40.1 mmHg. Left Atrium: Left atrial size was moderately dilated. Right Atrium: Right atrial size was severely dilated. Pericardium: There is no evidence of pericardial effusion. Mitral Valve: The mitral valve is grossly normal. There is mild thickening of the mitral valve leaflet(s). Moderate to severe mitral valve regurgitation. Mild mitral valve stenosis. MV peak gradient, 14.3 mmHg. The mean mitral valve gradient is 5.0 mmHg. Tricuspid Valve: The tricuspid valve is grossly normal. Tricuspid valve regurgitation is moderate to severe. Aortic Valve: The aortic valve is normal in structure. Aortic valve regurgitation is not visualized. No aortic stenosis is present. Pulmonic Valve: The pulmonic valve was grossly normal. Pulmonic valve regurgitation is trivial. No evidence of pulmonic stenosis. Aorta: The aortic root and ascending aorta are structurally normal, with no evidence of dilitation. IAS/Shunts: The atrial septum is grossly normal.  LEFT VENTRICLE PLAX 2D LVIDd:         6.30 cm      Diastology LVIDs:         5.90 cm      LV e' medial:    8.05 cm/s LV PW:         1.60 cm      LV E/e' medial:  9.9 LV IVS:        1.20 cm      LV e' lateral:   16.80 cm/s LVOT diam:     2.00 cm      LV E/e' lateral: 4.8 LV SV:         34 LV SV Index:   17 LVOT Area:     3.14 cm  LV Volumes (MOD) LV vol d, MOD A2C: 298.0 ml LV vol d, MOD A4C: 197.3 ml LV vol s, MOD A2C: 253.0 ml LV vol s, MOD A4C: 207.3 ml LV SV MOD A2C:     45.0 ml LV SV MOD A4C:     197.3 ml LV SV MOD BP:      -30.0 ml RIGHT VENTRICLE              IVC RV S prime:     16.60 cm/s  IVC diam: 2.80 cm TAPSE (M-mode): 1.7 cm LEFT ATRIUM            Index       RIGHT ATRIUM           Index LA diam:      6.00 cm  3.11 cm/m  RA Area:     36.00 cm LA Vol (A2C): 108.0 ml 55.97 ml/m RA Volume:   154.00 ml 79.81 ml/m LA Vol (A4C): 77.8 ml  40.32 ml/m  AORTIC VALVE LVOT Vmax:   80.20 cm/s LVOT Vmean:  56.000 cm/s LVOT VTI:    0.107 m  AORTA Ao Root diam: 3.20 cm Ao Asc diam:  3.40 cm MITRAL VALVE                 TRICUSPID VALVE MV Area (PHT): 5.02 cm      TR Peak grad:   41.7 mmHg MV Peak grad:  14.3 mmHg     TR Vmax:        323.00 cm/s MV Mean grad:  5.0 mmHg MV Vmax:       1.89 m/s      SHUNTS MV Vmean:  99.1 cm/s     Systemic VTI:  0.11 m MV Decel Time: 151 msec      Systemic Diam: 2.00 cm MR Peak grad:    84.6 mmHg MR Mean grad:    53.0 mmHg MR Vmax:         460.00 cm/s MR Vmean:        338.0 cm/s MR PISA:         1.01 cm MR PISA Eff ROA: 9 mm MR PISA Radius:  0.40 cm MV E velocity: 79.87 cm/s MV A velocity: 99.00 cm/s MV E/A ratio:  0.81 Mertie Moores MD Electronically signed by Mertie Moores MD Signature Date/Time: 10/04/2020/1:26:01 PM    Final      Medications:     Scheduled Medications:  aspirin EC  81 mg Oral Daily   atorvastatin  80 mg Oral Daily   Chlorhexidine Gluconate Cloth  6 each Topical Daily   docusate sodium  100 mg Oral Daily   furosemide  80 mg Intravenous Q12H   heparin  5,000 Units Subcutaneous Q8H   multivitamin with minerals   Oral Daily   pantoprazole  40 mg Oral Daily   potassium chloride SA  20 mEq Oral Daily   senna  1 tablet Oral Daily   sodium chloride flush  10-40 mL Intracatheter Q12H   sodium chloride flush  3 mL Intravenous Q12H    Infusions:  sodium chloride     milrinone 0.25 mcg/kg/min (10/05/20 0700)    PRN Medications: sodium chloride, acetaminophen, albuterol, nitroGLYCERIN, ondansetron (ZOFRAN) IV, sodium chloride flush, sodium chloride flush   Assessment/Plan    1. Acute on chronic Systolic HF w/ Presumed Low-output: Echo in 2015 with EF 40-45%, thought to be ischemic cardiomyopathy. Cath in 10/19 showed 90% ostial OM1 and 70% PLV, no interventional target. Suspect mixed ischemic/nonischemic cardiomyopathy. 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 Multiple re-admits for cardiogenic shock in the last 6 months requiring milrinone. Unfortunately, not a candidate for advanced therapies or home milrinone with active use of cocaine.He is also now homeless. He has been started on empiric milrinone this admit. Co-ox 65% -> 51% today. Feels ok on milrinone 0.25 - Volume status looks good. Will check CVP. Suspect we can switch to po lasix today.  - Restart digoxin  - Not candidate for advanced therapies or home inotropes. Will start milrinone wean tomorrow. If doesn't tolerate will need to consider Palliative Care - Long talk about his situation and social issues. Will touch base with SW.   2. CAD: H/o anterior STEMI with DES to LAD in 7/15.As above, cath in 10/19 with 90% ostial OM1 and 70% PLV, no interventional target.  - no s/s ischemia -ContinueASA 81 and atorvastatin 80 mg daily.   3. Cocaine abuse: continues to use cocaine. UDS +.  Abstinence imperative   4. Active smoker: Counseled to quit.  5. AKI:  - Scr 2.08 on admit, baseline ~1.6 - AKI 2/2 low ouput/ CHF - improving w/ inotrope and diuretics, 1.85 today - follow BMET daily  6. COVID Infection  - tested + 09/19/20 - no active symptoms   7. Mitral Regurgitation - moderate to severe functional MR   Length of Stay: 2  Glori Bickers, MD  10/05/2020, 8:01 AM  Advanced Heart Failure Team Pager [phone] (M-F; 7a -  4p)  Please contact [facility] for night-coverage after  hours (4p -7a ) and weekends on [email or web address]

## 2020-10-05 NOTE — Progress Notes (Signed)
PROGRESS NOTE    Tyler Wong  ZOX:[id number] DOB: 07/31/1966 DOA: 10/03/2020 PCP: [name], NP   Brief Narrative: 54 year old with past medical history significant for CAD, hypertension, cocaine abuse, chronic systolic heart failure ejection fraction 20% present complaining of abdominal distention, lower extremity edema and shortness of breath. He was recently diagnosed with COVID-19 14 days ago.  He no longer requires isolation for these.  He was found to be hypoxic oxygen saturation in the 90s, hypotensive systolic blood pressure in the 80s, creatinine 2.0 elevated BNP troponin I 85, chest x-ray showed cardiomegaly.  Patient was admitted under cardiology service for heart failure exacerbation and milrinone infusion.   Assessment & Plan:   Active Problems:   Coronary artery disease involving native heart   Acute systolic HF (heart failure) (HCC)   Acute on chronic systolic CHF (congestive heart failure) ([place])  1-Acute on chronic systolic heart failure; EF less than 20% April 2021. Treated with IV Lasix and milrinone infusion. Holding IV lasix.  Cardiology planning to wean milrinone. Not candidate for advance heart failure therapy.  If he is not able to tolerates milrinone wean off, he will need palliative care.  Continue with digoxin,   2-AKI on CKD stage IIIa: Fattening up to 2.0 on admission.  1.6 last month. Related to cardiorenal syndrome. Continue to monitor Cr 1.8.   Hypomagnesemia; replete IV>   3-CAD: Continue with aspirin and statin.  Cardiology following  4-cocaine abuse: Counseling provided  5-Covid 19 infection: Off of isolation. 6-Bronchitis, on  nebulizers prn.    Estimated body mass index is 23.03 kg/m as calculated from the following:   Height as of this encounter: 5\' 10"  (1.778 m).   Weight as of this encounter: 72.8 kg.   DVT prophylaxis: Heparin Code Status: Full code Family Communication: Care discussed with patient Disposition  Plan:  Status is: Inpatient  Remains inpatient appropriate because:IV treatments appropriate due to intensity of illness or inability to take PO   Dispo: The patient is from: Home              Anticipated d/c is to: Home              Anticipated d/c date is: 3 days              Patient currently is not medically stable to d/c.        Procedures:   Echo  Antimicrobials:  None  Subjective: He is breathing better.  Denies chest pain.   Objective: Vitals:   10/05/20 0758 10/05/20 0800 10/05/20 1245 10/05/20 1300  BP: (!) 85/73 102/77 90/65 (!) 92/52  Pulse: 94 92 98 98  Resp: 19 18 18 20   Temp: 98.2 F (36.8 C) 98.2 F (36.8 C) 98.4 F (36.9 C)   TempSrc: Oral  Oral   SpO2:  92%  90%  Weight:      Height:        Intake/Output Summary (Last 24 hours) at 10/05/2020 1706 Last data filed at 10/05/2020 1600 Gross per 24 hour  Intake 943.84 ml  Output 2275 ml  Net -1331.16 ml   Filed Weights   10/03/20 1038 10/03/20 2231 10/05/20 0542  Weight: 74 kg 75.4 kg 72.8 kg    Examination:  General exam: NAD Respiratory system: BL ronchus Cardiovascular system: S 1, S 2 RRR Gastrointestinal system: BS present, soft, nt Central nervous system: alert Extremities: Symmetric power.   Data Reviewed: I have personally reviewed following labs and imaging studies  CBC: Recent Labs  Lab 10/03/20 1050 10/03/20 2250 10/05/20 0410  WBC 8.2 8.4 9.0  HGB 12.6* 12.7* 12.5*  HCT 38.8* 38.7* 38.2*  MCV 87.4 86.8 87.6  PLT 296 263 269   Basic Metabolic Panel: Recent Labs  Lab 10/03/20 1141 10/03/20 2051 10/03/20 2250 10/04/20 0027 10/05/20 0410  NA 138  --   --  136 139  K 4.0  --   --  4.0 3.6  CL 104  --   --  100 98  CO2 23  --   --  26 32  GLUCOSE 100*  --   --  146* 128*  BUN 37*  --   --  32* 27*  CREATININE 2.08*  --  2.04* 1.98* 1.85*  CALCIUM 9.1  --   --  8.5* 8.6*  MG  --  2.0  --   --  1.5*   GFR: Estimated Creatinine Clearance: 47 mL/min (A) (by  C-G formula based on SCr of 1.85 mg/dL (H)). Liver Function Tests: Recent Labs  Lab 10/03/20 1141 10/03/20 2051  AST 36 31  ALT 41 39  ALKPHOS 130* 110  BILITOT 1.3* 0.8  PROT 6.5 5.7*  ALBUMIN 3.0* 2.6*   Recent Labs  Lab 10/03/20 1141  LIPASE 38   No results for input(s): AMMONIA in the last 168 hours. Coagulation Profile: No results for input(s): INR, PROTIME in the last 168 hours. Cardiac Enzymes: No results for input(s): CKTOTAL, CKMB, CKMBINDEX, TROPONINI in the last 168 hours. BNP (last 3 results) Recent Labs    01/30/20 1645  PROBNP 1,759*   HbA1C: No results for input(s): HGBA1C in the last 72 hours. CBG: No results for input(s): GLUCAP in the last 168 hours. Lipid Profile: No results for input(s): CHOL, HDL, LDLCALC, TRIG, CHOLHDL, LDLDIRECT in the last 72 hours. Thyroid Function Tests: Recent Labs    10/03/20 2250  TSH 0.443   Anemia Panel: No results for input(s): VITAMINB12, FOLATE, FERRITIN, TIBC, IRON, RETICCTPCT in the last 72 hours. Sepsis Labs: No results for input(s): PROCALCITON, LATICACIDVEN in the last 168 hours.  No results found for this or any previous visit (from the past 240 hour(s)).       Radiology Studies: ECHOCARDIOGRAM COMPLETE  Result Date: 10/04/2020    ECHOCARDIOGRAM REPORT   Patient Name:   Tyler Wong Date of Exam: 10/04/2020 Medical Rec #:  [id number]   Height:       70.0 in Accession #:    [id number]  Weight:       166.2 lb Date of Birth:  1966-07-04  BSA:          1.930 m Patient Age:    25 years    BP:           92/60 mmHg Patient Gender: M           HR:           109 bpm. Exam Location:  Inpatient Procedure: 2D Echo, 3D Echo, Cardiac Doppler and Color Doppler Indications:    I50.40* Unspecified combined systolic (congestive) and diastolic                 (congestive) heart failure  History:        Patient has prior history of Echocardiogram examinations. CHF,                 Previous Myocardial Infarction and CAD,  Abnormal ECG,  Signs/Symptoms:Chest Pain; Risk Factors:Current Smoker and                 Hypertension. Cardiogenic shock. Cocaine use. COvid 19 infection                 14 days ago.  Sonographer:    Roseanna Rainbow RDCS Referring Phys: [place] Comments: Patient moving throughout exam. IMPRESSIONS  1. 3 D images of the LV were obtained. . Left ventricular ejection fraction, by estimation, is 20 to 25%. The left ventricle has severely decreased function. The left ventricle demonstrates global hypokinesis. The left ventricular internal cavity size was moderately dilated. There is mild left ventricular hypertrophy. Left ventricular diastolic parameters are consistent with Grade II diastolic dysfunction (pseudonormalization).  2. Right ventricular systolic function is low normal. The right ventricular size is normal. There is moderately elevated pulmonary artery systolic pressure. The estimated right ventricular systolic pressure is 54.0 mmHg.  3. Left atrial size was moderately dilated.  4. Right atrial size was severely dilated.  5. The mitral valve is grossly normal. Moderate to severe mitral valve regurgitation. Mild mitral stenosis.  6. Tricuspid valve regurgitation is moderate to severe.  7. The aortic valve is normal in structure. Aortic valve regurgitation is not visualized. No aortic stenosis is present. FINDINGS  Left Ventricle: 3 D images of the LV were obtained. Left ventricular ejection fraction, by estimation, is 20 to 25%. The left ventricle has severely decreased function. The left ventricle demonstrates global hypokinesis. The left ventricular internal cavity size was moderately dilated. There is mild left ventricular hypertrophy. Left ventricular diastolic parameters are consistent with Grade II diastolic dysfunction (pseudonormalization). Right Ventricle: The right ventricular size is normal. No increase in right ventricular wall thickness. Right ventricular systolic  function is low normal. There is moderately elevated pulmonary artery systolic pressure. The tricuspid regurgitant velocity  is 3.23 m/s, and with an assumed right atrial pressure of 15 mmHg, the estimated right ventricular systolic pressure is 08.6 mmHg. Left Atrium: Left atrial size was moderately dilated. Right Atrium: Right atrial size was severely dilated. Pericardium: There is no evidence of pericardial effusion. Mitral Valve: The mitral valve is grossly normal. There is mild thickening of the mitral valve leaflet(s). Moderate to severe mitral valve regurgitation. Mild mitral valve stenosis. MV peak gradient, 14.3 mmHg. The mean mitral valve gradient is 5.0 mmHg. Tricuspid Valve: The tricuspid valve is grossly normal. Tricuspid valve regurgitation is moderate to severe. Aortic Valve: The aortic valve is normal in structure. Aortic valve regurgitation is not visualized. No aortic stenosis is present. Pulmonic Valve: The pulmonic valve was grossly normal. Pulmonic valve regurgitation is trivial. No evidence of pulmonic stenosis. Aorta: The aortic root and ascending aorta are structurally normal, with no evidence of dilitation. IAS/Shunts: The atrial septum is grossly normal.  LEFT VENTRICLE PLAX 2D LVIDd:         6.30 cm      Diastology LVIDs:         5.90 cm      LV e' medial:    8.05 cm/s LV PW:         1.60 cm      LV E/e' medial:  9.9 LV IVS:        1.20 cm      LV e' lateral:   16.80 cm/s LVOT diam:     2.00 cm      LV E/e' lateral: 4.8 LV SV:  34 LV SV Index:   17 LVOT Area:     3.14 cm  LV Volumes (MOD) LV vol d, MOD A2C: 298.0 ml LV vol d, MOD A4C: 197.3 ml LV vol s, MOD A2C: 253.0 ml LV vol s, MOD A4C: 207.3 ml LV SV MOD A2C:     45.0 ml LV SV MOD A4C:     197.3 ml LV SV MOD BP:      -30.0 ml RIGHT VENTRICLE             IVC RV S prime:     16.60 cm/s  IVC diam: 2.80 cm TAPSE (M-mode): 1.7 cm LEFT ATRIUM            Index       RIGHT ATRIUM           Index LA diam:      6.00 cm  3.11 cm/m  RA  Area:     36.00 cm LA Vol (A2C): 108.0 ml 55.97 ml/m RA Volume:   154.00 ml 79.81 ml/m LA Vol (A4C): 77.8 ml  40.32 ml/m  AORTIC VALVE LVOT Vmax:   80.20 cm/s LVOT Vmean:  56.000 cm/s LVOT VTI:    0.107 m  AORTA Ao Root diam: 3.20 cm Ao Asc diam:  3.40 cm MITRAL VALVE                 TRICUSPID VALVE MV Area (PHT): 5.02 cm      TR Peak grad:   41.7 mmHg MV Peak grad:  14.3 mmHg     TR Vmax:        323.00 cm/s MV Mean grad:  5.0 mmHg MV Vmax:       1.89 m/s      SHUNTS MV Vmean:      99.1 cm/s     Systemic VTI:  0.11 m MV Decel Time: 151 msec      Systemic Diam: 2.00 cm MR Peak grad:    84.6 mmHg MR Mean grad:    53.0 mmHg MR Vmax:         460.00 cm/s MR Vmean:        338.0 cm/s MR PISA:         1.01 cm MR PISA Eff ROA: 9 mm MR PISA Radius:  0.40 cm MV E velocity: 79.87 cm/s MV A velocity: 99.00 cm/s MV E/A ratio:  0.81 Mertie Moores MD Electronically signed by Mertie Moores MD Signature Date/Time: 10/04/2020/1:26:01 PM    Final    [place] EKG SITE RITE  Result Date: 10/03/2020 If Site Rite image not attached, placement could not be confirmed due to current cardiac rhythm.       Scheduled Meds: . aspirin EC  81 mg Oral Daily  . atorvastatin  80 mg Oral Daily  . Chlorhexidine Gluconate Cloth  6 each Topical Daily  . digoxin  0.125 mg Oral Daily  . docusate sodium  100 mg Oral Daily  . heparin  5,000 Units Subcutaneous Q8H  . multivitamin with minerals   Oral Daily  . pantoprazole  40 mg Oral Daily  . potassium chloride SA  20 mEq Oral Daily  . senna  1 tablet Oral Daily  . sodium chloride flush  10-40 mL Intracatheter Q12H  . sodium chloride flush  3 mL Intravenous Q12H   Continuous Infusions: . sodium chloride    . milrinone 0.25 mcg/kg/min (10/05/20 0700)     LOS: 2 days    Time spent: 35  minutes.     Elmarie Shiley, MD [facility]   If 7PM-7AM, please contact night-coverage [email or web address]  10/05/2020, 5:06 PM

## 2020-10-06 DIAGNOSIS — Z515 Encounter for palliative care: Secondary | ICD-10-CM

## 2020-10-06 DIAGNOSIS — I2510 Atherosclerotic heart disease of native coronary artery without angina pectoris: Secondary | ICD-10-CM | POA: Diagnosis not present

## 2020-10-06 DIAGNOSIS — I5023 Acute on chronic systolic (congestive) heart failure: Secondary | ICD-10-CM | POA: Diagnosis not present

## 2020-10-06 DIAGNOSIS — Z20822 Contact with and (suspected) exposure to covid-19: Secondary | ICD-10-CM

## 2020-10-06 DIAGNOSIS — Z7189 Other specified counseling: Secondary | ICD-10-CM

## 2020-10-06 LAB — COOXEMETRY PANEL
Carboxyhemoglobin: 0.9 % (ref 0.5–1.5)
Carboxyhemoglobin: 1.2 % (ref 0.5–1.5)
Methemoglobin: 1.1 % (ref 0.0–1.5)
Methemoglobin: 1.3 % (ref 0.0–1.5)
O2 Saturation: 40.8 %
O2 Saturation: 60.9 %
Total hemoglobin: 12 g/dL (ref 12.0–16.0)
Total hemoglobin: 10.7 g/dL — ABNORMAL LOW (ref 12.0–16.0)

## 2020-10-06 LAB — GLUCOSE, CAPILLARY: Glucose-Capillary: 167 mg/dL — ABNORMAL HIGH (ref 70–99)

## 2020-10-06 LAB — MAGNESIUM: Magnesium: 1.9 mg/dL (ref 1.7–2.4)

## 2020-10-06 LAB — BASIC METABOLIC PANEL
Anion gap: 8 (ref 5–15)
BUN: 22 mg/dL — ABNORMAL HIGH (ref 6–20)
CO2: 29 mmol/L (ref 22–32)
Calcium: 8.8 mg/dL — ABNORMAL LOW (ref 8.9–10.3)
Chloride: 98 mmol/L (ref 98–111)
Creatinine, Ser: 1.45 mg/dL — ABNORMAL HIGH (ref 0.61–1.24)
GFR, Estimated: 54 mL/min — ABNORMAL LOW (ref >60)
Glucose, Bld: 109 mg/dL — ABNORMAL HIGH (ref 70–99)
Potassium: 4.1 mmol/L (ref 3.5–5.1)
Sodium: 135 mmol/L (ref 135–145)

## 2020-10-06 MED ORDER — CLONAZEPAM 0.5 MG PO TABS
0.5000 mg | ORAL_TABLET | Freq: Two times a day (BID) | ORAL | Status: DC
  Administered 2020-10-06 – 2020-10-08 (×6): 0.5 mg via ORAL
  Filled 2020-10-06 (×7): qty 1

## 2020-10-06 MED ORDER — TORSEMIDE 20 MG PO TABS
40.0000 mg | ORAL_TABLET | Freq: Every day | ORAL | Status: DC
  Administered 2020-10-06: 40 mg via ORAL
  Filled 2020-10-06: qty 2

## 2020-10-06 MED ORDER — FUROSEMIDE 10 MG/ML IJ SOLN
80.0000 mg | Freq: Two times a day (BID) | INTRAMUSCULAR | Status: AC
  Administered 2020-10-06 – 2020-10-07 (×3): 80 mg via INTRAVENOUS
  Filled 2020-10-06 (×3): qty 8

## 2020-10-06 NOTE — Plan of Care (Signed)

## 2020-10-06 NOTE — Consult Note (Addendum)
Palliative Medicine Inpatient Consult Note  Reason for consult:  Severe Heart Failure  HPI:  Per intake H&P --> 54 year old with past medical history significant for CAD, hypertension, cocaine abuse, chronic systolic heart failure ejection fraction 20% present complaining of abdominal distention, lower extremity edema and shortness of breath. He is not a candidate for home ionotrope or other advanced heart failure therapies as he has on-going cocaine abuse.  Palliative care had met with Tyler Wong in April of this year as he had been admitted then with cardiogenic shock.   Palliative care was asked to get involved during this admission in the setting of severe heart failure.  Clinical Assessment/Goals of Care: I have reviewed medical records including EPIC notes, labs and imaging, received report from bedside RN, assessed the patient.    I met with Tyler Wong to further discuss diagnosis prognosis, GOC, EOL wishes, disposition and options.   I introduced Palliative Medicine as specialized medical care for people living with serious illness. It focuses on providing relief from the symptoms and stress of a serious illness. The goal is to improve quality of life for both the patient and the family. He asked why I am seeing him as he spoke to "a lady" in April. I shared with Tyler Wong that for any admission it is important that we check in as sometimes goals change based upon illness severity.  Tyler Wong lives in a home with his long term girlfriend of 30(+) years, Tyler Wong. He has two daughters with whom he is close. 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 He used to enjoy travelling for pleasure.  He is a man of faith and practices within the [employer] den[employer].  Prior to admission Tyler Wong was able to perform bADL's and iADL's.   I asked Tyler Wong what his understanding of his heart failure is.  He said that he realizes his heart failure is bad, as this is what the cardiology team continues to tell him.   He then says he does not really want to talk more about that.  I tried to explore these feelings though this attempt was refuted.   I explained that in instances such as his it is exceptionally worrisome in terms of his ejection fraction being so low indicating poor heart function. We talked about his cocaine abuse which is severe and he tends to deny.   A discussion was had today regarding advanced directives - Tyler Wong said he already "told us this already and filled these out in April". Per Tyler Wong document review his sister - Tyler Wong and Tyler Wong are his HCPOA's.   Concepts specific to code status, artifical feeding and hydration, continued IV antibiotics and rehospitalization was had.  Tyler Wong states that again - he would want everything done and if things did not improve he would not want to be on life support for prolonged durations. I asked if he would be willing to fill a MOST form with me to better reflect this - he said he would not and asked that I leave the room.  I kindly excused myself and shared if he is interested in touching base again that I am in the hospital throughout the day.  Discussed the importance of continued conversation with family and their  medical providers regarding overall plan of care and treatment options, ensuring decisions are within the context of the patients values and GOCs. ________________________________________________________________________________________ Addendum: I reached out to patient's Sister Tyler Wong.  I asked Tyler Wong how things had been going since April when  Tyler Wong and she had met with the palliative care team.  She shares initially when Tyler Wong left the hospital he was doing very well in terms of compliance with medications.    He unfortunately, fell back into drug abuse which is likely what led to his downfall per Tyler Wong.  He was unable to get into his home for a period of time due to a family altercation of sorts and had about 9 to 10 days without  medication.  Tyler Wong shares this is likely why he is back at Tyler Wong in severe heart failure.  In terms of Tyler Wong's drug addiction - this has been a long struggle per Tyler Wong which he has not been able to overcome. He had at one point considered rehabilitation though he felt better and then started using again so was not a candidate.   We talked in greater detail about the severity of his heart failure in relation to if we were to pursue resuscitation efforts.  We reviewed that resuscitation efforts are not able to fix the underlying chronic disease processes and often result in more injury and harm to patients as well as more psychological trauma for family.    Tyler Wong did understand this as per her conversations with Tyler Wong in the past he would want every effort made to sustain living.  Though, if he were in a position where he were not improving after period of days and the medical team felt that he would not improve he would wish for his family to make the decision to cessate from care.  I introduced the topic of hospice care.  I described hospice as a service for patients for have a life expectancy of < 6 months. It preserves dignity and quality at the end phases of life. The focus changes from curative to symptom relief. Tyler Wong shares that she has some familiarity as she works in the death industry at a [employer].  Tyler Wong shares that we need to be honest with her brother so that he understands the seriousness of the situation at hand.   Tyler Wong describes that Tyler Wong is also enduring additional existential stress as his longtime partner is in the medical ICU for Covid pneumonia.  She shares that she does not believe she is doing very well.  She does think requesting spiritual evaluation could aid in supporting him during this trial-some time.  Tyler Wong is interested in learning more about palliative and hospice services. I was able to provide a brief description of each for her.  Decision  Maker: Patient makes decisions for himself though if unable to do so he would elect his sister, Tyler Wong to make decisions on his behalf.  SUMMARY OF RECOMMENDATIONS   Full code / Full scope of treatment  TOC - OP Palliative support  Spiritual - Christian  Ongoing PMT support - it appears that patient is having a difficult time recognizing that his illness is potentially fatal  Code Status/Advance Care Planning: FULL CODE   Palliative Prophylaxis:   Oral Care, Mobility  Additional Recommendations (Limitations, Scope, Preferences):  Continue full scope of treatment   Psycho-social/Spiritual:   Desire for further Chaplaincy support: Yes - Christian  Additional Recommendations: Education on disease severity   Prognosis: Poor in the setting of continued cocaine use and severe heart failure. < 6 months  Discharge Planning: Discharge plan is unclear  PPS: 60-70%   This conversation/these recommendations were discussed with patient primary care team.  Time In: 1200 Time Out: 1310 Total Time: 70  Greater than 50%  of this time was spent counseling and coordinating care related to the above assessment and plan.  Tyler Wong Team Team Cell Phone: [phone] Please utilize secure chat with additional questions, if there is no response within 30 minutes please call the above phone number  Palliative Medicine Team providers are available by phone from 7am to 7pm daily and can be reached through the team cell phone.  Should this patient require assistance outside of these hours, please call the patient's attending physician.

## 2020-10-06 NOTE — Progress Notes (Addendum)
Called that Tyler Wong was extremely restless/anxious. Assessed patient and before talking to him he was very restless in bed, moving around constantly.   VS: HR 105, BP 110/80s, RR 12-18, 100% on RA CVP (18) zeroed Checked POC BG at (167)  Per patient he was not taking any other non prescription meds other than cocaine prior to admission and has no recent etoh hx so says there is nothing he could be actively w/d from. He was perseverating on his diuretic regimen, was on torsemide 40 mg PO daily (although unsure how consistent he was taking it). He has been receiving lasix 80 mg IV bid which was d/c 10/09 AM (LD 10/08 at 2119). Team planning on switching back to PO diuresis yesterday/today based on notes. Will start back on torsemide 40 mg PO daily and primary team can titrate based on response. He doesn't appear to be developing worsening cardiogenic shock based off current exam and I am less excited about giving anti anxiety meds given his ongoing narcotic use hx and extremely poor cardiac fxn. He seems to be perseverating about diuretics so feel it won't be an issue to restart PO diuresis earlier today.  - start torsemide 40 mg PO daily

## 2020-10-06 NOTE — Progress Notes (Signed)
Advanced Heart Failure Rounding Note  PCP-Cardiologist: Shelva Majestic, MD  [facility]: Dr. 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 Also w/ h/o poor compliance. Previous referred to paramedicine but not fully cooperative. Recently diagnosed w/ COVID 09/19/20.   Remains on milrinone 0.25. Very agitated this morning. Impulsive and restless. Says he feels terrible. More SOB. Says he needs IV lasix.   CVP 16 Co-ox 41%  Objective:   Weight Range: 75.1 kg Body mass index is 23.76 kg/m.   Vital Signs:   Temp:  [98.1 F (36.7 C)-98.4 F (36.9 C)] 98.3 F (36.8 C) (10/10 0805) Pulse Rate:  [97-119] 99 (10/10 0805) Resp:  [17-40] 20 (10/10 0805) BP: (90-123)/(52-90) 96/76 (10/10 0805) SpO2:  [90 %-98 %] 93 % (10/10 0805) Weight:  [75.1 kg] 75.1 kg (10/10 0354)    Weight change: Filed Weights   10/03/20 2231 10/05/20 0542 10/06/20 0354  Weight: 75.4 kg 72.8 kg 75.1 kg    Intake/Output:   Intake/Output Summary (Last 24 hours) at 10/06/2020 0954 Last data filed at 10/06/2020 0810 Gross per 24 hour  Intake 943.32 ml  Output 1550 ml  Net -606.68 ml      Physical Exam    General:  Restless agitated impulsive. Uncomfortable  HEENT: normal Neck: supple. JVP to jaw Carotids 2+ bilat; no bruits. No lymphadenopathy or thryomegaly appreciated. Cor: PMI nondisplaced. Tachy regular 3/6 MR +s3 Lungs: clear Abdomen: soft, nontender, nondistended. No hepatosplenomegaly. No bruits or masses. Good bowel sounds. Extremities: no cyanosis, clubbing, rash, edema Neuro: alert & orientedx3, cranial nerves grossly intact. moves all 4 extremities w/o difficulty. Affect angry   Telemetry   Sinus 90-110 Personally reviewed   Labs      CBC Recent Labs    10/03/20 2250 10/05/20 0410  WBC 8.4 9.0  HGB 12.7* 12.5*  HCT 38.7* 38.2*  MCV 86.8 87.6  PLT 263 790   Basic Metabolic Panel Recent Labs    10/03/20 2051 10/03/20 2250 10/05/20 0410 10/06/20 0425  NA  --    < > 139 135  K  --    < > 3.6 4.1  CL  --    < > 98 98  CO2  --    < > 32 29  GLUCOSE  --    < > 128* 109*  BUN  --    < > 27* 22*  CREATININE  --    < > 1.85* 1.45*  CALCIUM  --    < > 8.6* 8.8*  MG 2.0  --  1.5*  --    < > = values in this interval not displayed.   Liver Function Tests Recent Labs    10/03/20 1141 10/03/20 2051  AST 36 31  ALT 41 39  ALKPHOS 130* 110  BILITOT 1.3* 0.8  PROT 6.5 5.7*  ALBUMIN 3.0* 2.6*   Recent Labs    10/03/20 1141  LIPASE 38   Cardiac Enzymes No results for input(s): CKTOTAL, CKMB, CKMBINDEX, TROPONINI in the last 72 hours.  BNP: BNP (last 3 results) Recent Labs    09/19/20 2058 09/21/20 0138 10/03/20 1222  BNP 478.3* 578.1* 2,190.9*    ProBNP (last 3 results) Recent Labs    01/30/20  Davie*     D-Dimer No results for input(s): DDIMER in the last 72 hours. Hemoglobin A1C No results for input(s): HGBA1C in the last 72 hours. Fasting Lipid Panel No results for input(s): CHOL, HDL, LDLCALC, TRIG, CHOLHDL, LDLDIRECT in the last 72 hours. Thyroid Function Tests Recent Labs    10/03/20 2250  TSH 0.443    Other results:   Imaging    No results found.   Medications:     Scheduled Medications:  aspirin EC  81 mg Oral Daily   atorvastatin  80 mg Oral Daily   Chlorhexidine Gluconate Cloth  6 each Topical Daily   clonazePAM  0.5 mg Oral BID   digoxin  0.125 mg Oral Daily   docusate sodium  100 mg Oral Daily   furosemide  80 mg Intravenous BID   heparin  5,000 Units Subcutaneous Q8H   multivitamin with minerals   Oral Daily   pantoprazole  40 mg Oral Daily   potassium chloride SA  20 mEq Oral Daily   senna  1 tablet Oral Daily   sodium  chloride flush  10-40 mL Intracatheter Q12H   sodium chloride flush  3 mL Intravenous Q12H    Infusions:  sodium chloride     milrinone 0.25 mcg/kg/min (10/05/20 2004)    PRN Medications: sodium chloride, acetaminophen, albuterol, alum & mag hydroxide-simeth, nitroGLYCERIN, ondansetron (ZOFRAN) IV, sodium chloride flush, sodium chloride flush   Assessment/Plan   1. Acute on chronic Systolic HF w/ Presumed Low-output: Echo in 2015 with EF 40-45%, thought to be ischemic cardiomyopathy. Cath in 10/19 showed 90% ostial OM1 and 70% PLV, no interventional target. Suspect mixed ischemic/nonischemic cardiomyopathy. 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 Multiple re-admits for cardiogenic shock in the last 6 months requiring milrinone. Unfortunately, not a candidate for advanced therapies or home milrinone with active use of cocaine.He is also now homeless. He has been started on empiric milrinone this admit - now on 0.25. Co-ox 65% -> 51% -> 41% today. CVP 15 - He has end-stage HF with falling co-ox despite milrinone. CVP climbing as well - Given social situation he is not a candidate for any advanced therapies including home milrinone and I explained this again to him and his sister at the bedside - I suspect his restlessness is a combination of withdrawal symptoms (which he adamantly denies) and worsening HF - Currently no role for increasing inotropes as we will eventually have to wean anyway - I offered him morphine for comfort but he deferred. He has spoken to [facility] before and says he knows where this is going - I will resume IV lasix. Keep milrinone at 0.25. Continue digoxin. - Start klonopin for help with anxiety and possible WD - Re-engage Palliative care today - His  sister was in room for entire discussion and realizes the gravity of the situation   2. CAD: H/o anterior STEMI with DES to LAD in 7/15.As above, cath in 10/19 with 90% ostial OM1 and 70% PLV, no interventional target.  - no s/s ischemia -ContinueASA 81 and atorvastatin 80 mg daily.   3. Cocaine abuse: continues to use cocaine. UDS +.   - question having WD symptoms  4. Active smoker: Counseled to quit.  5. AKI:  - Scr 2.08 on admit, baseline ~  1.6 - AKI 2/2 low ouput/ CHF - improved w/ inotrope and diuretics, 1.45 today - follow BMET daily  6. COVID Infection  - tested + 09/19/20 - no active symptoms   7. Mitral Regurgitation - moderate to severe functional MR   Length of Stay: 3  Glori Bickers, MD  10/06/2020, 9:54 AM  Advanced Heart Failure Team Pager [phone] (M-F; 7a - 4p)  Please contact [facility] for night-coverage after hours (4p -7a ) and weekends on [email or web address]

## 2020-10-06 NOTE — Progress Notes (Addendum)
PROGRESS NOTE    Tyler Wong  CZY:[id number] DOB: [birth year] DOA: 10/03/2020 PCP: [name], NP   Brief Narrative: 54 year old with past medical history significant for CAD, hypertension, cocaine abuse, chronic systolic heart failure ejection fraction 20% present complaining of abdominal distention, lower extremity edema and shortness of breath. He was recently diagnosed with COVID-19 14 days ago.  He no longer requires isolation for these.  He was found to be hypoxic oxygen saturation in the 90s, hypotensive systolic blood pressure in the 80s, creatinine 2.0 elevated BNP troponin I 85, chest x-ray showed cardiomegaly.  Patient was admitted under cardiology service for heart failure exacerbation and milrinone infusion.   Assessment & Plan:   Active Problems:   Coronary artery disease involving native heart   Acute systolic HF (heart failure) (HCC)   Acute on chronic systolic CHF (congestive heart failure) ([place])  1-Acute on chronic systolic heart failure; EF less than 20% April 2021. Treated with IV Lasix and milrinone infusion. IV lasix discontinue. Last night started Torsemide.  Cardiology planning to wean milrinone. Not candidate for advance heart failure therapy.  If he is not able to tolerates milrinone wean off, he will need palliative care.  Continue with digoxin. -Events from last night notice, worsening dyspnea, restlessness. He improved after torsemide.  -He is breathing better this am.  -Long conversation in regards abstinence from drugs,.  -Negative 7 L. Urine out put yesterday 800 cc.  Further diuretics per cardiology.   2-AKI on CKD stage IIIa: Fattening up to 2.0 on admission.  1.6 last month. Related to cardiorenal syndrome. Continue to monitor Cr down to 1.5.   Hypomagnesemia; Repeat labs this am.   3-CAD: Continue with aspirin and statin.  Cardiology following  4-Cocaine abuse: Counseling provided  5-Covid 19 infection: Off of  isolation. 6-Bronchitis, on  nebulizers prn.    Estimated body mass index is 23.76 kg/m as calculated from the following:   Height as of this encounter: 5\' 10"  (1.778 m).   Weight as of this encounter: 75.1 kg.   DVT prophylaxis: Heparin Code Status: Full code Family Communication: Care discussed with patient Disposition Plan:  Status is: Inpatient  Remains inpatient appropriate because:IV treatments appropriate due to intensity of illness or inability to take PO   Dispo: The patient is from: Home              Anticipated d/c is to: Home              Anticipated d/c date is: 3 days              Patient currently is not medically stable to d/c.        Procedures:   Echo  Antimicrobials:  None  Subjective: Events from last night notice.  He report dyspnea improved compare to last night.   Objective: Vitals:   10/06/20 0452 10/06/20 0456 10/06/20 0548 10/06/20 0805  BP:    96/76  Pulse: (!) 118 (!) 119 97 99  Resp: (!) 29 (!) 25 20 20   Temp:    98.3 F (36.8 C)  TempSrc:    Oral  SpO2: 95% 95% 96% 93%  Weight:      Height:        Intake/Output Summary (Last 24 hours) at 10/06/2020 0819 Last data filed at 10/06/2020 0810 Gross per 24 hour  Intake 943.32 ml  Output 1550 ml  Net -606.68 ml   Filed Weights   10/03/20 2231 10/05/20 0542 10/06/20 0354  Weight: 75.4 kg 72.8 kg 75.1 kg    Examination:  General exam: NAD Respiratory system: Crackles bases Cardiovascular system: S 1, S 2 RRR Gastrointestinal system: BS present, soft, nt Central nervous system: Alert, following command Extremities: Symmetric power.   Data Reviewed: I have personally reviewed following labs and imaging studies  CBC: Recent Labs  Lab 10/03/20 1050 10/03/20 2250 10/05/20 0410  WBC 8.2 8.4 9.0  HGB 12.6* 12.7* 12.5*  HCT 38.8* 38.7* 38.2*  MCV 87.4 86.8 87.6  PLT 296 263 782   Basic Metabolic Panel: Recent Labs  Lab 10/03/20 1141 10/03/20 2051 10/03/20 2250  10/04/20 0027 10/05/20 0410 10/06/20 0425  NA 138  --   --  136 139 135  K 4.0  --   --  4.0 3.6 4.1  CL 104  --   --  100 98 98  CO2 23  --   --  26 32 29  GLUCOSE 100*  --   --  146* 128* 109*  BUN 37*  --   --  32* 27* 22*  CREATININE 2.08*  --  2.04* 1.98* 1.85* 1.45*  CALCIUM 9.1  --   --  8.5* 8.6* 8.8*  MG  --  2.0  --   --  1.5*  --    GFR: Estimated Creatinine Clearance: 60.1 mL/min (A) (by C-G formula based on SCr of 1.45 mg/dL (H)). Liver Function Tests: Recent Labs  Lab 10/03/20 1141 10/03/20 2051  AST 36 31  ALT 41 39  ALKPHOS 130* 110  BILITOT 1.3* 0.8  PROT 6.5 5.7*  ALBUMIN 3.0* 2.6*   Recent Labs  Lab 10/03/20 1141  LIPASE 38   No results for input(s): AMMONIA in the last 168 hours. Coagulation Profile: No results for input(s): INR, PROTIME in the last 168 hours. Cardiac Enzymes: No results for input(s): CKTOTAL, CKMB, CKMBINDEX, TROPONINI in the last 168 hours. BNP (last 3 results) Recent Labs    01/30/20 1645  PROBNP 1,759*   HbA1C: No results for input(s): HGBA1C in the last 72 hours. CBG: Recent Labs  Lab 10/06/20 0522  GLUCAP 167*   Lipid Profile: No results for input(s): CHOL, HDL, LDLCALC, TRIG, CHOLHDL, LDLDIRECT in the last 72 hours. Thyroid Function Tests: Recent Labs    10/03/20 2250  TSH 0.443   Anemia Panel: No results for input(s): VITAMINB12, FOLATE, FERRITIN, TIBC, IRON, RETICCTPCT in the last 72 hours. Sepsis Labs: No results for input(s): PROCALCITON, LATICACIDVEN in the last 168 hours.  No results found for this or any previous visit (from the past 240 hour(s)).       Radiology Studies: ECHOCARDIOGRAM COMPLETE  Result Date: 10/04/2020    ECHOCARDIOGRAM REPORT   Patient Name:   Tyler Wong Date of Exam: 10/04/2020 Medical Rec #:  [id number]   Height:       70.0 in Accession #:    [id number]  Weight:       166.2 lb Date of Birth:  31-May-1966  BSA:          1.930 m Patient Age:    56 years    BP:           92/60  mmHg Patient Gender: M           HR:           109 bpm. Exam Location:  Inpatient Procedure: 2D Echo, 3D Echo, Cardiac Doppler and Color Doppler Indications:    I50.40* Unspecified combined systolic (congestive) and diastolic                 (  congestive) heart failure  History:        Patient has prior history of Echocardiogram examinations. CHF,                 Previous Myocardial Infarction and CAD, Abnormal ECG,                 Signs/Symptoms:Chest Pain; Risk Factors:Current Smoker and                 Hypertension. Cardiogenic shock. Cocaine use. COvid 19 infection                 14 days ago.  Sonographer:    Roseanna Rainbow RDCS Referring Phys: Boon Comments: Patient moving throughout exam. IMPRESSIONS  1. 3 D images of the LV were obtained. . Left ventricular ejection fraction, by estimation, is 20 to 25%. The left ventricle has severely decreased function. The left ventricle demonstrates global hypokinesis. The left ventricular internal cavity size was moderately dilated. There is mild left ventricular hypertrophy. Left ventricular diastolic parameters are consistent with Grade II diastolic dysfunction (pseudonormalization).  2. Right ventricular systolic function is low normal. The right ventricular size is normal. There is moderately elevated pulmonary artery systolic pressure. The estimated right ventricular systolic pressure is 66.4 mmHg.  3. Left atrial size was moderately dilated.  4. Right atrial size was severely dilated.  5. The mitral valve is grossly normal. Moderate to severe mitral valve regurgitation. Mild mitral stenosis.  6. Tricuspid valve regurgitation is moderate to severe.  7. The aortic valve is normal in structure. Aortic valve regurgitation is not visualized. No aortic stenosis is present. FINDINGS  Left Ventricle: 3 D images of the LV were obtained. Left ventricular ejection fraction, by estimation, is 20 to 25%. The left ventricle has severely decreased function.  The left ventricle demonstrates global hypokinesis. The left ventricular internal cavity size was moderately dilated. There is mild left ventricular hypertrophy. Left ventricular diastolic parameters are consistent with Grade II diastolic dysfunction (pseudonormalization). Right Ventricle: The right ventricular size is normal. No increase in right ventricular wall thickness. Right ventricular systolic function is low normal. There is moderately elevated pulmonary artery systolic pressure. The tricuspid regurgitant velocity  is 3.23 m/s, and with an assumed right atrial pressure of 15 mmHg, the estimated right ventricular systolic pressure is 40.3 mmHg. Left Atrium: Left atrial size was moderately dilated. Right Atrium: Right atrial size was severely dilated. Pericardium: There is no evidence of pericardial effusion. Mitral Valve: The mitral valve is grossly normal. There is mild thickening of the mitral valve leaflet(s). Moderate to severe mitral valve regurgitation. Mild mitral valve stenosis. MV peak gradient, 14.3 mmHg. The mean mitral valve gradient is 5.0 mmHg. Tricuspid Valve: The tricuspid valve is grossly normal. Tricuspid valve regurgitation is moderate to severe. Aortic Valve: The aortic valve is normal in structure. Aortic valve regurgitation is not visualized. No aortic stenosis is present. Pulmonic Valve: The pulmonic valve was grossly normal. Pulmonic valve regurgitation is trivial. No evidence of pulmonic stenosis. Aorta: The aortic root and ascending aorta are structurally normal, with no evidence of dilitation. IAS/Shunts: The atrial septum is grossly normal.  LEFT VENTRICLE PLAX 2D LVIDd:         6.30 cm      Diastology LVIDs:         5.90 cm      LV e' medial:    8.05 cm/s LV PW:  1.60 cm      LV E/e' medial:  9.9 LV IVS:        1.20 cm      LV e' lateral:   16.80 cm/s LVOT diam:     2.00 cm      LV E/e' lateral: 4.8 LV SV:         34 LV SV Index:   17 LVOT Area:     3.14 cm  LV Volumes  (MOD) LV vol d, MOD A2C: 298.0 ml LV vol d, MOD A4C: 197.3 ml LV vol s, MOD A2C: 253.0 ml LV vol s, MOD A4C: 207.3 ml LV SV MOD A2C:     45.0 ml LV SV MOD A4C:     197.3 ml LV SV MOD BP:      -30.0 ml RIGHT VENTRICLE             IVC RV S prime:     16.60 cm/s  IVC diam: 2.80 cm TAPSE (M-mode): 1.7 cm LEFT ATRIUM            Index       RIGHT ATRIUM           Index LA diam:      6.00 cm  3.11 cm/m  RA Area:     36.00 cm LA Vol (A2C): 108.0 ml 55.97 ml/m RA Volume:   154.00 ml 79.81 ml/m LA Vol (A4C): 77.8 ml  40.32 ml/m  AORTIC VALVE LVOT Vmax:   80.20 cm/s LVOT Vmean:  56.000 cm/s LVOT VTI:    0.107 m  AORTA Ao Root diam: 3.20 cm Ao Asc diam:  3.40 cm MITRAL VALVE                 TRICUSPID VALVE MV Area (PHT): 5.02 cm      TR Peak grad:   41.7 mmHg MV Peak grad:  14.3 mmHg     TR Vmax:        323.00 cm/s MV Mean grad:  5.0 mmHg MV Vmax:       1.89 m/s      SHUNTS MV Vmean:      99.1 cm/s     Systemic VTI:  0.11 m MV Decel Time: 151 msec      Systemic Diam: 2.00 cm MR Peak grad:    84.6 mmHg MR Mean grad:    53.0 mmHg MR Vmax:         460.00 cm/s MR Vmean:        338.0 cm/s MR PISA:         1.01 cm MR PISA Eff ROA: 9 mm MR PISA Radius:  0.40 cm MV E velocity: 79.87 cm/s MV A velocity: 99.00 cm/s MV E/A ratio:  0.81 Mertie Moores MD Electronically signed by Mertie Moores MD Signature Date/Time: 10/04/2020/1:26:01 PM    Final         Scheduled Meds: . aspirin EC  81 mg Oral Daily  . atorvastatin  80 mg Oral Daily  . Chlorhexidine Gluconate Cloth  6 each Topical Daily  . digoxin  0.125 mg Oral Daily  . docusate sodium  100 mg Oral Daily  . heparin  5,000 Units Subcutaneous Q8H  . multivitamin with minerals   Oral Daily  . pantoprazole  40 mg Oral Daily  . potassium chloride SA  20 mEq Oral Daily  . senna  1 tablet Oral Daily  . sodium chloride flush  10-40 mL Intracatheter Q12H  .  sodium chloride flush  3 mL Intravenous Q12H  . torsemide  40 mg Oral Daily   Continuous Infusions: . sodium  chloride    . milrinone 0.25 mcg/kg/min (10/05/20 2004)     LOS: 3 days    Time spent: 35 minutes.     Elmarie Shiley, MD [facility]   If 7PM-7AM, please contact night-coverage [email or web address]  10/06/2020, 8:19 AM

## 2020-10-06 NOTE — Progress Notes (Signed)
CVP: 19

## 2020-10-07 DIAGNOSIS — I5023 Acute on chronic systolic (congestive) heart failure: Secondary | ICD-10-CM | POA: Diagnosis not present

## 2020-10-07 LAB — COOXEMETRY PANEL
Carboxyhemoglobin: 1.5 % (ref 0.5–1.5)
Methemoglobin: 1.2 % (ref 0.0–1.5)
O2 Saturation: 67 %
Total hemoglobin: 12.8 g/dL (ref 12.0–16.0)

## 2020-10-07 LAB — BASIC METABOLIC PANEL
Anion gap: 9 (ref 5–15)
BUN: 22 mg/dL — ABNORMAL HIGH (ref 6–20)
CO2: 28 mmol/L (ref 22–32)
Calcium: 8.4 mg/dL — ABNORMAL LOW (ref 8.9–10.3)
Chloride: 100 mmol/L (ref 98–111)
Creatinine, Ser: 1.71 mg/dL — ABNORMAL HIGH (ref 0.61–1.24)
GFR, Estimated: 44 mL/min — ABNORMAL LOW (ref >60)
Glucose, Bld: 128 mg/dL — ABNORMAL HIGH (ref 70–99)
Potassium: 4.1 mmol/L (ref 3.5–5.1)
Sodium: 137 mmol/L (ref 135–145)

## 2020-10-07 LAB — MAGNESIUM: Magnesium: 1.7 mg/dL (ref 1.7–2.4)

## 2020-10-07 MED ORDER — TORSEMIDE 20 MG PO TABS
80.0000 mg | ORAL_TABLET | Freq: Every day | ORAL | Status: DC
  Administered 2020-10-08 – 2020-10-09 (×2): 80 mg via ORAL
  Filled 2020-10-07 (×3): qty 4

## 2020-10-07 MED ORDER — MILRINONE LACTATE IN DEXTROSE 20-5 MG/100ML-% IV SOLN
0.1250 ug/kg/min | INTRAVENOUS | Status: DC
  Administered 2020-10-07: 0.125 ug/kg/min via INTRAVENOUS
  Filled 2020-10-07: qty 100

## 2020-10-07 MED ORDER — MAGNESIUM SULFATE 2 GM/50ML IV SOLN
2.0000 g | Freq: Once | INTRAVENOUS | Status: AC
  Administered 2020-10-07: 2 g via INTRAVENOUS
  Filled 2020-10-07: qty 50

## 2020-10-07 NOTE — Progress Notes (Addendum)
Advanced Heart Failure Rounding Note  PCP-Cardiologist: Shelva Majestic, MD  [facility]: Dr. 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 Also w/ h/o poor compliance. Previous referred to paramedicine but not fully cooperative. Recently diagnosed w/ COVID 09/19/20.   Remains on milrinone 0.25 mcg.  CO-OX 67%.   Yesterday diuresed with IV lasix. Brisk diuresis noted. CVP down 7-8.   Asking if we can help him with medications when is discharged.    Objective:   Weight Range: 72.2 kg Body mass index is 22.83 kg/m.   Vital Signs:   Temp:  [97.7 F (36.5 C)-99.4 F (37.4 C)] 98.8 F (37.1 C) (10/11 0447) Pulse Rate:  [94-104] 94 (10/11 0009) Resp:  [18-24] 18 (10/11 0447) BP: (92-120)/(69-85) 98/78 (10/11 0447) SpO2:  [91 %-95 %] 93 % (10/11 0447) Weight:  [72.2 kg] 72.2 kg (10/11 0447)    Weight change: Filed Weights   10/05/20 0542 10/06/20 0354 10/07/20 0447  Weight: 72.8 kg 75.1 kg 72.2 kg    Intake/Output:   Intake/Output Summary (Last 24 hours) at 10/07/2020 0731 Last data filed at 10/07/2020 2130 Gross per 24 hour  Intake 240 ml  Output 5105 ml  Net -4865 ml      Physical Exam  CVP 7-8 sitting on the side of the bed.   General:  No resp difficulty HEENT: normal Neck: supple. JVP 7-8 . Carotids 2+ bilat; no bruits. No lymphadenopathy or thryomegaly appreciated. Cor: PMI nondisplaced. Regular rate & rhythm. No rubs, gallops or murmurs. Lungs: clear Abdomen: soft, nontender, nondistended. No hepatosplenomegaly. No bruits or masses. Good bowel sounds. Extremities: no cyanosis, clubbing, rash, edema. RUE PICC  Neuro: alert & orientedx3, cranial nerves grossly intact. moves all 4 extremities w/o difficulty.  Affect pleasant    Telemetry   SR -ST 90-100s    Labs    CBC Recent Labs    10/05/20 0410  WBC 9.0  HGB 12.5*  HCT 38.2*  MCV 87.6  PLT 865   Basic Metabolic Panel Recent Labs    10/05/20 0410 10/06/20 0425 10/06/20 1030  NA 139 135  --   K 3.6 4.1  --   CL 98 98  --   CO2 32 29  --   GLUCOSE 128* 109*  --   BUN 27* 22*  --   CREATININE 1.85* 1.45*  --   CALCIUM 8.6* 8.8*  --   MG 1.5*  --  1.9   Liver Function Tests No results for input(s): AST, ALT, ALKPHOS, BILITOT, PROT, ALBUMIN in the last 72 hours. No results for input(s): LIPASE, AMYLASE in the last 72 hours. Cardiac Enzymes No results for input(s): CKTOTAL, CKMB, CKMBINDEX, TROPONINI in the last 72 hours.  BNP: BNP (last 3 results) Recent Labs    09/19/20 2058 09/21/20 0138 10/03/20 1222  BNP 478.3* 578.1* 2,190.9*    ProBNP (last 3 results) Recent Labs    01/30/20 1645  PROBNP 1,759*     D-Dimer No results for input(s): DDIMER in the last 72 hours. Hemoglobin A1C No results for input(s): HGBA1C in the last 72 hours. Fasting Lipid Panel No results for input(s): CHOL, HDL, LDLCALC, TRIG, CHOLHDL, LDLDIRECT in the last 72 hours. Thyroid Function Tests No results  for input(s): TSH, T4TOTAL, T3FREE, THYROIDAB in the last 72 hours.  Invalid input(s): FREET3  Other results:   Imaging    No results found.   Medications:     Scheduled Medications: . aspirin EC  81 mg Oral Daily  . atorvastatin  80 mg Oral Daily  . Chlorhexidine Gluconate Cloth  6 each Topical Daily  . clonazePAM  0.5 mg Oral BID  . digoxin  0.125 mg Oral Daily  . docusate sodium  100 mg Oral Daily  . furosemide  80 mg Intravenous BID  . heparin  5,000 Units Subcutaneous Q8H  . multivitamin with minerals   Oral Daily  . pantoprazole  40 mg Oral Daily  . potassium chloride SA  20 mEq Oral Daily  . senna  1 tablet Oral Daily  . sodium chloride flush  10-40 mL Intracatheter Q12H  . sodium chloride flush  3  mL Intravenous Q12H    Infusions: . sodium chloride    . milrinone 0.25 mcg/kg/min (10/06/20 1305)    PRN Medications: sodium chloride, acetaminophen, albuterol, alum & mag hydroxide-simeth, nitroGLYCERIN, ondansetron (ZOFRAN) IV, sodium chloride flush, sodium chloride flush   Assessment/Plan   1. Acute on chronic Systolic HF w/ Presumed Low-output: Echo in 2015 with EF 40-45%, thought to be ischemic cardiomyopathy. Cath in 10/19 showed 90% ostial OM1 and 70% PLV, no interventional target. Suspect mixed ischemic/nonischemic cardiomyopathy. 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 Multiple re-admits for cardiogenic shock in the last 6 months requiring milrinone. Unfortunately, not a candidate for advanced therapies or home milrinone with active use of cocaine.He is also now homeless. He has been started on empiric milrinone this admit - He has end-stage HF with falling co-ox despite milrinone.  - Given social situation he is not a candidate for any advanced therapies including home milrinone and I explained this again to him and his sister at the bedside -Remains on milrinone 0.25 mcg. CO-OX 65%. CVP now down to 7-8.  - Cut back milrinone 0.125 mcg.  - Give one more dose of IV lasix the start torsemide 80 mg daily tomorrow.  -Continue klonopin for help with anxiety and possible WD - Palliative Care appreciated.   2. CAD: H/o anterior STEMI with DES to LAD in 7/15.As above, cath in 10/19 with 90% ostial OM1 and 70% PLV, no interventional target.  - No chest pain.  -ContinueASA 81 and atorvastatin 80 mg daily.   3. Cocaine abuse: continues to use cocaine. UDS +.   - question having WD symptoms  4. Active smoker: Counseled to quit.  5. AKI:  - Scr 2.08 on admit, baseline  ~1.6 - AKI 2/2 low ouput/ CHF - BMET pending.   6. COVID Infection  - tested + 09/19/20 - no active symptoms   7. Mitral Regurgitation - moderate to severe functional MR  8. GOC Palliative Care following.   9. Social:  He will need 30 day supply of meds prior to discharge. He is homeless.   Ambulate today.   Length of Stay: 4  Amy Clegg, NP  10/07/2020, 7:31 AM  Advanced Heart Failure Team Pager [phone] (M-F; 7a - 4p)  Please contact [facility] for night-coverage after hours (4p -7a ) and weekends on [email or web address]  Patient seen and examined with the above-signed Advanced Practice Provider  and/or Housestaff. I personally reviewed laboratory data, imaging studies and relevant notes. I independently examined the patient and formulated the important aspects of the plan. I have edited the note to reflect any of my changes or salient points. I have personally discussed the plan with the patient and/or family.  Improved today on milrinone 0.25 and with diuresis. Co-ox improved . CVP 7-8. Breathing better. Refused to talk to Palliative Care yesterday.   General:   Lying in bed No resp difficulty HEENT: normal Neck: supple. JVP 7-8 Carotids 2+ bilat; no bruits. No lymphadenopathy or thryomegaly appreciated. Cor: PMI nondisplaced. Regular rate & rhythm. +s3 Lungs: clear Abdomen: soft, nontender, nondistended. No hepatosplenomegaly. No bruits or masses. Good bowel sounds. Extremities: no cyanosis, clubbing, rash, edema Neuro: alert & orientedx3, cranial nerves grossly intact. moves all 4 extremities w/o difficulty. Affect pleasant  Continue milrinone and IV lasix one more day. Supp mag.  Not candidate for advanced therapies. He is currently homeless due to an argument with his daughter. Have SW work see. (He has spoken to [place] before).   Glori Bickers, MD  8:10 AM

## 2020-10-07 NOTE — Progress Notes (Addendum)
Chaplain attempted spiritual care. Pt. sleeping at time of visit. Chaplain will F/U today.  *1529 Chaplain F/U spiritual care visit.  The Pt. room is dark. The chaplain communicated with the Pt. RN-Michael.  The chaplain understands the Pt. did not sleep well last night.  The chaplain will F/U on Tuesday.

## 2020-10-07 NOTE — Progress Notes (Signed)
CARDIAC REHAB PHASE I   PRE:  Rate/Rhythm: 107 ST  BP:  Supine: 95/82  Sitting: 100/89  Standing:    SaO2: 94%RA  MODE:  Ambulation: 600 ft   POST:  Rate/Rhythm: 116 ST  BP:  Supine:   Sitting: 104/85  Standing:    SaO2: 98%RA 0945-1030 Pt walked 600 ft on RA with steady gait and tolerated well. C/o left arch of foot being uncomfortable but improved with walk. Bed to sitting on side of bed after walk. No DOE.   Graylon Good, RN BSN  10/07/2020 10:26 AM

## 2020-10-07 NOTE — Progress Notes (Signed)
PROGRESS NOTE    Tyler Wong  TMH:[id number] DOB: 25-Jul-1966 DOA: 10/03/2020 PCP: [name], NP   Brief Narrative: 54 year old with past medical history significant for CAD, hypertension, cocaine abuse, chronic systolic heart failure ejection fraction 20% present complaining of abdominal distention, lower extremity edema and shortness of breath. He was recently diagnosed with COVID-19 14 days ago.  He no longer requires isolation for these.  He was found to be hypoxic oxygen saturation in the 90s, hypotensive systolic blood pressure in the 80s, creatinine 2.0 elevated BNP troponin I 85, chest x-ray showed cardiomegaly.  Patient was admitted under cardiology service for heart failure exacerbation and milrinone infusion. Currently weaning milrinone.   Assessment & Plan:   Active Problems:   Coronary artery disease involving native heart   Acute systolic HF (heart failure) (HCC)   Acute on chronic systolic CHF (congestive heart failure) ([place])   Palliative care by specialist   Goals of care, counseling/discussion  1-Acute on chronic systolic heart failure; -EF less than 20% April 2021. -Treated with IV Lasix and milrinone infusion.  -Not candidate for advance heart failure therapy.  -Palliative care following.  -Continue with digoxin. -Patient develop worsening dyspnea, restlessness the night of 10/10. He improved after torsemide.  -Negative 11 L. Urine out put yesterday 5 L.  -Plan to continue to wean milrinone and IV lasix today.   2-AKI on CKD stage IIIa: Fattening up to 2.0 on admission.  1.6 last month. Related to cardiorenal syndrome. Cr at 1.7   Hypomagnesemia; replete   3-CAD: Continue with aspirin and statin.  Cardiology following  4-Cocaine abuse: Counseling provided  5-Covid 19 infection: Off of isolation. 6-Bronchitis, on  nebulizers prn.    Estimated body mass index is 22.83 kg/m as calculated from the following:   Height as of this encounter: 5'  10" (1.778 m).   Weight as of this encounter: 72.2 kg.   DVT prophylaxis: Heparin Code Status: Full code Family Communication: Care discussed with patient Disposition Plan:  Status is: Inpatient  Remains inpatient appropriate because:IV treatments appropriate due to intensity of illness or inability to take PO   Dispo: The patient is from: Home              Anticipated d/c is to: Home              Anticipated d/c date is: 3 days              Patient currently is not medically stable to d/c.        Procedures:   Echo  Antimicrobials:  None  Subjective: I encourage him to speak with palliative care about CODE status and goals of care.  He is breathing better this morning.   Objective: Vitals:   10/06/20 2056 10/07/20 0009 10/07/20 0447 10/07/20 0805  BP:  92/70 98/78 (!) 83/60  Pulse:  94  100  Resp: 20 18 18 20   Temp:  98.8 F (37.1 C) 98.8 F (37.1 C) 97.8 F (36.6 C)  TempSrc:  Oral Oral Oral  SpO2:  91% 93% 94%  Weight:   72.2 kg   Height:        Intake/Output Summary (Last 24 hours) at 10/07/2020 1254 Last data filed at 10/07/2020 9211 Gross per 24 hour  Intake 240 ml  Output 2680 ml  Net -2440 ml   Filed Weights   10/05/20 0542 10/06/20 0354 10/07/20 0447  Weight: 72.8 kg 75.1 kg 72.2 kg    Examination:  General exam: NAD Respiratory system: Normal respiratory effort, crackles bases.  Cardiovascular system: S 1, S 2 RRR Gastrointestinal system: BS present, soft, nt Central nervous system: alert, follows command Extremities: no edema  Data Reviewed: I have personally reviewed following labs and imaging studies  CBC: Recent Labs  Lab 10/03/20 1050 10/03/20 2250 10/05/20 0410  WBC 8.2 8.4 9.0  HGB 12.6* 12.7* 12.5*  HCT 38.8* 38.7* 38.2*  MCV 87.4 86.8 87.6  PLT 296 263 161   Basic Metabolic Panel: Recent Labs  Lab 10/03/20 1141 10/03/20 1141 10/03/20 2051 10/03/20 2250 10/04/20 0027 10/05/20 0410 10/06/20 0425  10/06/20 1030 10/07/20 0458  NA 138  --   --   --  136 139 135  --  137  K 4.0  --   --   --  4.0 3.6 4.1  --  4.1  CL 104  --   --   --  100 98 98  --  100  CO2 23  --   --   --  26 32 29  --  28  GLUCOSE 100*  --   --   --  146* 128* 109*  --  128*  BUN 37*  --   --   --  32* 27* 22*  --  22*  CREATININE 2.08*   < >  --  2.04* 1.98* 1.85* 1.45*  --  1.71*  CALCIUM 9.1  --   --   --  8.5* 8.6* 8.8*  --  8.4*  MG  --   --  2.0  --   --  1.5*  --  1.9 1.7   < > = values in this interval not displayed.   GFR: Estimated Creatinine Clearance: 50.4 mL/min (A) (by C-G formula based on SCr of 1.71 mg/dL (H)). Liver Function Tests: Recent Labs  Lab 10/03/20 1141 10/03/20 2051  AST 36 31  ALT 41 39  ALKPHOS 130* 110  BILITOT 1.3* 0.8  PROT 6.5 5.7*  ALBUMIN 3.0* 2.6*   Recent Labs  Lab 10/03/20 1141  LIPASE 38   No results for input(s): AMMONIA in the last 168 hours. Coagulation Profile: No results for input(s): INR, PROTIME in the last 168 hours. Cardiac Enzymes: No results for input(s): CKTOTAL, CKMB, CKMBINDEX, TROPONINI in the last 168 hours. BNP (last 3 results) Recent Labs    01/30/20 1645  PROBNP 1,759*   HbA1C: No results for input(s): HGBA1C in the last 72 hours. CBG: Recent Labs  Lab 10/06/20 0522  GLUCAP 167*   Lipid Profile: No results for input(s): CHOL, HDL, LDLCALC, TRIG, CHOLHDL, LDLDIRECT in the last 72 hours. Thyroid Function Tests: No results for input(s): TSH, T4TOTAL, FREET4, T3FREE, THYROIDAB in the last 72 hours. Anemia Panel: No results for input(s): VITAMINB12, FOLATE, FERRITIN, TIBC, IRON, RETICCTPCT in the last 72 hours. Sepsis Labs: No results for input(s): PROCALCITON, LATICACIDVEN in the last 168 hours.  No results found for this or any previous visit (from the past 240 hour(s)).       Radiology Studies: No results found.      Scheduled Meds: . aspirin EC  81 mg Oral Daily  . atorvastatin  80 mg Oral Daily  .  Chlorhexidine Gluconate Cloth  6 each Topical Daily  . clonazePAM  0.5 mg Oral BID  . digoxin  0.125 mg Oral Daily  . docusate sodium  100 mg Oral Daily  . heparin  5,000 Units Subcutaneous Q8H  . multivitamin with minerals  Oral Daily  . pantoprazole  40 mg Oral Daily  . potassium chloride SA  20 mEq Oral Daily  . senna  1 tablet Oral Daily  . sodium chloride flush  10-40 mL Intracatheter Q12H  . sodium chloride flush  3 mL Intravenous Q12H  . [START ON 10/08/2020] torsemide  80 mg Oral Daily   Continuous Infusions: . sodium chloride    . milrinone 0.125 mcg/kg/min (10/07/20 0837)     LOS: 4 days    Time spent: 35 minutes.     Elmarie Shiley, MD [facility]   If 7PM-7AM, please contact night-coverage [email or web address]  10/07/2020, 12:54 PM

## 2020-10-08 ENCOUNTER — Inpatient Hospital Stay (HOSPITAL_COMMUNITY): Payer: Medicaid Other

## 2020-10-08 ENCOUNTER — Encounter (HOSPITAL_COMMUNITY): Payer: Medicaid Other

## 2020-10-08 DIAGNOSIS — N179 Acute kidney failure, unspecified: Secondary | ICD-10-CM

## 2020-10-08 DIAGNOSIS — I5023 Acute on chronic systolic (congestive) heart failure: Secondary | ICD-10-CM | POA: Diagnosis not present

## 2020-10-08 DIAGNOSIS — I2510 Atherosclerotic heart disease of native coronary artery without angina pectoris: Secondary | ICD-10-CM | POA: Diagnosis not present

## 2020-10-08 LAB — URINALYSIS, ROUTINE W REFLEX MICROSCOPIC
Bilirubin Urine: NEGATIVE
Glucose, UA: NEGATIVE mg/dL
Hgb urine dipstick: NEGATIVE
Ketones, ur: NEGATIVE mg/dL
Leukocytes,Ua: NEGATIVE
Nitrite: NEGATIVE
Protein, ur: NEGATIVE mg/dL
Specific Gravity, Urine: 1.008 (ref 1.005–1.030)
pH: 7 (ref 5.0–8.0)

## 2020-10-08 LAB — COOXEMETRY PANEL
Carboxyhemoglobin: 1.9 % — ABNORMAL HIGH (ref 0.5–1.5)
Methemoglobin: 1.1 % (ref 0.0–1.5)
O2 Saturation: 65.1 %
Total hemoglobin: 13.1 g/dL (ref 12.0–16.0)

## 2020-10-08 LAB — HEPATIC FUNCTION PANEL
ALT: 22 U/L (ref 0–44)
AST: 20 U/L (ref 15–41)
Albumin: 2.5 g/dL — ABNORMAL LOW (ref 3.5–5.0)
Alkaline Phosphatase: 81 U/L (ref 38–126)
Bilirubin, Direct: 0.4 mg/dL — ABNORMAL HIGH (ref 0.0–0.2)
Indirect Bilirubin: 1.2 mg/dL — ABNORMAL HIGH (ref 0.3–0.9)
Total Bilirubin: 1.6 mg/dL — ABNORMAL HIGH (ref 0.3–1.2)
Total Protein: 5.9 g/dL — ABNORMAL LOW (ref 6.5–8.1)

## 2020-10-08 LAB — CBC
HCT: 36.9 % — ABNORMAL LOW (ref 39.0–52.0)
Hemoglobin: 12.3 g/dL — ABNORMAL LOW (ref 13.0–17.0)
MCH: 28.7 pg (ref 26.0–34.0)
MCHC: 33.3 g/dL (ref 30.0–36.0)
MCV: 86.2 fL (ref 80.0–100.0)
Platelets: 175 10*3/uL (ref 150–400)
RBC: 4.28 MIL/uL (ref 4.22–5.81)
RDW: 17 % — ABNORMAL HIGH (ref 11.5–15.5)
WBC: 6.5 10*3/uL (ref 4.0–10.5)
nRBC: 0 % (ref 0.0–0.2)

## 2020-10-08 LAB — BASIC METABOLIC PANEL
Anion gap: 10 (ref 5–15)
BUN: 23 mg/dL — ABNORMAL HIGH (ref 6–20)
CO2: 25 mmol/L (ref 22–32)
Calcium: 8.7 mg/dL — ABNORMAL LOW (ref 8.9–10.3)
Chloride: 97 mmol/L — ABNORMAL LOW (ref 98–111)
Creatinine, Ser: 1.47 mg/dL — ABNORMAL HIGH (ref 0.61–1.24)
GFR, Estimated: 53 mL/min — ABNORMAL LOW (ref >60)
Glucose, Bld: 135 mg/dL — ABNORMAL HIGH (ref 70–99)
Potassium: 4.4 mmol/L (ref 3.5–5.1)
Sodium: 132 mmol/L — ABNORMAL LOW (ref 135–145)

## 2020-10-08 MED ORDER — LOSARTAN POTASSIUM 25 MG PO TABS
12.5000 mg | ORAL_TABLET | Freq: Every day | ORAL | Status: DC
  Administered 2020-10-08: 12.5 mg via ORAL
  Filled 2020-10-08: qty 1

## 2020-10-08 MED ORDER — SODIUM CHLORIDE 0.9 % IV BOLUS
250.0000 mL | Freq: Once | INTRAVENOUS | Status: AC
  Administered 2020-10-08: 250 mL via INTRAVENOUS

## 2020-10-08 MED ORDER — MILRINONE LACTATE IN DEXTROSE 20-5 MG/100ML-% IV SOLN: 0.1250 ug/kg/min | INTRAVENOUS | Status: DC

## 2020-10-08 MED ORDER — SODIUM CHLORIDE 0.9 % IV SOLN: 1.0000 g | INTRAVENOUS | Status: DC

## 2020-10-08 MED ORDER — ALTEPLASE 2 MG IJ SOLR
2.0000 mg | Freq: Once | INTRAMUSCULAR | Status: AC
  Administered 2020-10-08: 2 mg
  Filled 2020-10-08: qty 2

## 2020-10-08 MED ORDER — SODIUM CHLORIDE 0.9 % IV SOLN
2.0000 g | Freq: Two times a day (BID) | INTRAVENOUS | Status: DC
  Administered 2020-10-08 – 2020-10-09 (×2): 2 g via INTRAVENOUS
  Filled 2020-10-08 (×2): qty 2

## 2020-10-08 MED ORDER — VANCOMYCIN HCL 1250 MG/250ML IV SOLN
1250.0000 mg | INTRAVENOUS | Status: DC
  Filled 2020-10-08: qty 250

## 2020-10-08 MED ORDER — VANCOMYCIN HCL 1250 MG/250ML IV SOLN
1250.0000 mg | Freq: Once | INTRAVENOUS | Status: AC
  Administered 2020-10-08: 1250 mg via INTRAVENOUS
  Filled 2020-10-08: qty 250

## 2020-10-08 MED ORDER — SODIUM CHLORIDE 0.9 % IV BOLUS: 250.0000 mL | Freq: Once | INTRAVENOUS | Status: AC

## 2020-10-08 NOTE — Progress Notes (Signed)
Radiology questionning if pt should be on isolation due to having COVID positive on 9/23, notified IP which mentionned pt outside isolation window, notified MD Dr. Ethelle Lyon of findings and ok to leave pt off isolation per IP recommendations.

## 2020-10-08 NOTE — Progress Notes (Signed)
This chaplain was able to complete yesterday's attempted spiritual care visit and referral from PMT.  The Pt. is sitting up bedside eating a banana at the time of the visit. The chaplain shared respect for the Pt. loss.  The Pt. shared he is leaning on his daughters and sister during this time and doesn't really have any thing to say.  The Pt. shares tomorrow the MD will re-evaluate for next steps in the Pt. care. The Pt. accepts the chaplain private prayer for the Pt. two life events and a F/U spiritual care visit.

## 2020-10-08 NOTE — Progress Notes (Signed)
Advanced Heart Failure Rounding Note  PCP-Cardiologist: Shelva Majestic, MD  [facility]: Dr. 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 Also w/ h/o poor compliance. Previous referred to paramedicine but not fully cooperative. Recently diagnosed w/ COVID 09/19/20.   Milrinone cut back yesterday to 0.125 mcg.  CO-OX stable at 65%. Creatinine improved from 1.7 -> 1.5  Sad this am as his wife died from Magazine last night on [place].  Lasix stopped yesterday and now more SOB. Neck veins up. CVP line not hooked up.    Objective:   Weight Range: 73.4 kg Body mass index is 23.22 kg/m.   Vital Signs:   Temp:  [97.7 F (36.5 C)-98.2 F (36.8 C)] 97.7 F (36.5 C) (10/11 2333) Pulse Rate:  [100] 100 (10/11 0805) Resp:  [19-20] 19 (10/11 2333) BP: (83-103)/(60-84) 103/84 (10/11 2333) SpO2:  [94 %-99 %] 98 % (10/12 0400) Weight:  [73.4 kg] 73.4 kg (10/12 0500) Last BM Date: 10/07/20  Weight change: Filed Weights   10/06/20 0354 10/07/20 0447 10/08/20 0500  Weight: 75.1 kg 72.2 kg 73.4 kg    Intake/Output:   Intake/Output Summary (Last 24 hours) at 10/08/2020 0630 Last data filed at 10/08/2020 0500 Gross per 24 hour  Intake 1118.44 ml  Output 3000 ml  Net -1881.56 ml      Physical Exam   General:  Sad No resp difficulty HEENT: normal Neck: supple. JVP to jaw Carotids 2+ bilat; no bruits. No lymphadenopathy or thryomegaly appreciated. Cor: PMI nondisplaced. Tachy regular +s3 Lungs: clear Abdomen: soft, nontender, nondistended. No hepatosplenomegaly. No bruits or masses. Good bowel sounds. Extremities: no cyanosis, clubbing, rash, edema Neuro: alert & orientedx3, cranial nerves grossly intact. moves all 4 extremities w/o  difficulty. Affect tearful   Telemetry   Sinus 100-110 Personally reviewed  Labs    CBC No results for input(s): WBC, NEUTROABS, HGB, HCT, MCV, PLT in the last 72 hours. Basic Metabolic Panel Recent Labs    10/06/20 0425 10/06/20 1030 10/07/20 0458 10/08/20 0450  NA   < >  --  137 132*  K   < >  --  4.1 4.4  CL   < >  --  100 97*  CO2   < >  --  28 25  GLUCOSE   < >  --  128* 135*  BUN   < >  --  22* 23*  CREATININE   < >  --  1.71* 1.47*  CALCIUM   < >  --  8.4* 8.7*  MG  --  1.9 1.7  --    < > = values in this interval not displayed.   Liver Function Tests No results for input(s): AST, ALT, ALKPHOS, BILITOT, PROT, ALBUMIN in the last 72 hours. No results for input(s): LIPASE, AMYLASE in the last 72 hours. Cardiac Enzymes No results for input(s): CKTOTAL, CKMB, CKMBINDEX, TROPONINI in the last 72 hours.  BNP: BNP (last 3 results) Recent Labs    09/19/20 2058 09/21/20 0138 10/03/20 1222  BNP 478.3* 578.1* 2,190.9*    ProBNP (last 3 results) Recent Labs    01/30/20 1645  PROBNP 1,759*     D-Dimer No results for input(s): DDIMER in the last 72 hours.  Hemoglobin A1C No results for input(s): HGBA1C in the last 72 hours. Fasting Lipid Panel No results for input(s): CHOL, HDL, LDLCALC, TRIG, CHOLHDL, LDLDIRECT in the last 72 hours. Thyroid Function Tests No results for input(s): TSH, T4TOTAL, T3FREE, THYROIDAB in the last 72 hours.  Invalid input(s): FREET3  Other results:   Imaging    No results found.   Medications:     Scheduled Medications: . aspirin EC  81 mg Oral Daily  . atorvastatin  80 mg Oral Daily  . Chlorhexidine Gluconate Cloth  6 each Topical Daily  . clonazePAM  0.5 mg Oral BID  . digoxin  0.125 mg Oral Daily  . docusate sodium  100 mg Oral Daily  . heparin  5,000 Units Subcutaneous Q8H  . multivitamin with minerals   Oral Daily  . pantoprazole  40 mg Oral Daily  . potassium chloride SA  20 mEq Oral Daily  . senna  1  tablet Oral Daily  . sodium chloride flush  10-40 mL Intracatheter Q12H  . sodium chloride flush  3 mL Intravenous Q12H  . torsemide  80 mg Oral Daily    Infusions: . sodium chloride    . milrinone 0.125 mcg/kg/min (10/07/20 0837)    PRN Medications: sodium chloride, acetaminophen, albuterol, alum & mag hydroxide-simeth, nitroGLYCERIN, ondansetron (ZOFRAN) IV, sodium chloride flush, sodium chloride flush   Assessment/Plan   1. Acute on chronic Systolic HF w/ Presumed Low-output: Echo in 2015 with EF 40-45%, thought to be ischemic cardiomyopathy. Cath in 10/19 showed 90% ostial OM1 and 70% PLV, no interventional target. Suspect mixed ischemic/nonischemic cardiomyopathy. 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 Multiple re-admits for cardiogenic shock in the last 6 months requiring milrinone. Unfortunately, not a candidate for advanced therapies or home milrinone with active use of cocaine.He is also now homeless. He has been started on empiric milrinone this admit - He has end-stage HF.   - Given social situation he is not a candidate for any advanced therapies including home milrinone and I explained this again to him and his sister at the bedside - Milrinone decreased yesterday to 0.125 mcg. CO-OX stable at 65%. - Now on torsemide 80 mg daily. Will give dose now and see if he responds  - Will continue milrinone today as CVP up and more SOB. If responds to torsemide can stop milrinone later today or in am  - Continue digoxin - start spiro 12.5 & losartan 12.5  - Continue klonopin for help with anxiety and possible WD - Palliative Care appreciated though he remains resistant to their help at this time   2. CAD: H/o anterior STEMI with DES to LAD in 7/15.As above, cath in 10/19 with  90% ostial OM1 and 70% PLV, no interventional target.  - No chest pain.  -ContinueASA 81 and atorvastatin 80 mg daily.   3. Cocaine abuse: continues to use cocaine. UDS +.   - question having WD symptoms  4. Active smoker: Counseled to quit.  5. AKI:  - Scr 2.08 on admit, baseline ~1.6 - AKI 2/2 low ouput/ CHF - Creatinine back to baseline 1.5   6. COVID Infection  - tested + 09/19/20 - no active symptoms   7. Mitral Regurgitation - moderate to severe functional MR  8. [place] - Palliative Care following.  9. Social:  - He will need 30 day supply of meds prior to discharge. He is homeless.    Length of Stay: [place], MD  10/08/2020, 6:30 AM  Advanced Heart Failure Team Pager [phone] (M-F; 7a - 4p)  Please contact [facility] for night-coverage after hours (4p -7a ) and weekends on [email or web address]

## 2020-10-08 NOTE — Progress Notes (Signed)
Called by nursing regarding fever.   Temp 102.6 BP 90/64 Pulse 109. O2 sats 96%.   He does have PICC in RUE that has clotted x2 today and required TPA x2. Earlier this morning losartan and torsemide started. He has also been on milrinone 0.125 mcg.   On exam he is A&O x3, lungs clear, RUE non tender with palpable pulse. In SR.   He is not a candidate for inotropes so I will stop milrinon and remove PICC. Obtain CBC, blood cultures x2,  and UA now. Remove PICC.  Hold losartan and torsemide.   Start vanc and cefepime.   Check RUE for DVT once PICC out.    Lenoria Narine NP-C  1:45 PM

## 2020-10-08 NOTE — Plan of Care (Signed)

## 2020-10-08 NOTE — Progress Notes (Signed)
PROGRESS NOTE    Tyler Wong  TDD:[id number] DOB: Jul 26, 1966 DOA: 10/03/2020 PCP: [name], NP   Brief Narrative: 54 year old with past medical history significant for CAD, hypertension, cocaine abuse, chronic systolic heart failure ejection fraction 20% present complaining of abdominal distention, lower extremity edema and shortness of breath. He was recently diagnosed with COVID-19 14 days ago.  He no longer requires isolation for these.  He was found to be hypoxic oxygen saturation in the 90s, hypotensive systolic blood pressure in the 80s, creatinine 2.0 elevated BNP troponin I 85, chest x-ray showed cardiomegaly.  Patient was admitted under cardiology service for heart failure exacerbation and milrinone infusion. Currently weaning milrinone.  Patient spike fever 102 on 10/12. Chest x ray negative for PNA. He has PICC line that will be remove. Blood cultures and UA ordered. Will cover with IV antibiotics.    Assessment & Plan:   Active Problems:   Coronary artery disease involving native heart   Acute systolic HF (heart failure) (HCC)   Acute on chronic systolic CHF (congestive heart failure) ([place])   Palliative care by specialist   Goals of care, counseling/discussion  1-Acute on chronic systolic heart failure; -EF less than 20% April 2021. -Treated with IV Lasix and milrinone infusion.  -Not candidate for advance heart failure therapy.  -Palliative care following.  -Continue with digoxin. -Patient develop worsening dyspnea, restlessness the night of 10/10. He improved after torsemide.  -Negative 15 L. Urine out put yesterday 2.9 L.  -Plan to continue to wean milrinone. -on oral torsemide.   2-Fever;  Patient spike fever, SBP low (but it could be related to HF, medications; losartan).  Order CBC, UA, Blood culture.  Will cover with Vancomycin and ceftriaxone.  Chest x ray done earlier today negative for PNA>   AKI on CKD stage IIIa: Fattening up to 2.0 on  admission.  1.6 last month. Related to cardiorenal syndrome. Cr at 1.4  Hypomagnesemia; replaced.   3-CAD: Continue with aspirin and statin.  Cardiology following  4-Cocaine abuse: Counseling provided  5-Covid 19 infection: Off of isolation. 6-Bronchitis, on  nebulizers prn.    Estimated body mass index is 23.22 kg/m as calculated from the following:   Height as of this encounter: 5\' 10"  (1.778 m).   Weight as of this encounter: 73.4 kg.   DVT prophylaxis: Heparin Code Status: Full code Family Communication: Care discussed with patient Disposition Plan:  Status is: Inpatient  Remains inpatient appropriate because:IV treatments appropriate due to intensity of illness or inability to take PO   Dispo: The patient is from: Home              Anticipated d/c is to: Home              Anticipated d/c date is: 3 days              Patient currently is not medically stable to d/c.        Procedures:   Echo  Antimicrobials:  None  Subjective: He is feeling down, his girlfriend die today.  He report no worsening dyspnea. Denies chest pain, cough, dysuria.   Objective: Vitals:   10/07/20 2000 10/07/20 2333 10/08/20 0400 10/08/20 0500  BP:  103/84    Pulse:      Resp:  19    Temp: 98.2 F (36.8 C) 97.7 F (36.5 C)    TempSrc: Oral Oral    SpO2: 99% 99% 98%   Weight:    73.4 kg  Height:        Intake/Output Summary (Last 24 hours) at 10/08/2020 0803 Last data filed at 10/08/2020 0500 Gross per 24 hour  Intake 1118.44 ml  Output 2900 ml  Net -1781.56 ml   Filed Weights   10/06/20 0354 10/07/20 0447 10/08/20 0500  Weight: 75.1 kg 72.2 kg 73.4 kg    Examination:  General exam: NAD Respiratory system: Normal respiratory effort, BL ronchus Cardiovascular system: S 1, S 2 RRR Gastrointestinal system: BS present, soft nt Central nervous system: alert, follows command Extremities: no edema  Data Reviewed: I have personally reviewed following labs and  imaging studies  CBC: Recent Labs  Lab 10/03/20 1050 10/03/20 2250 10/05/20 0410  WBC 8.2 8.4 9.0  HGB 12.6* 12.7* 12.5*  HCT 38.8* 38.7* 38.2*  MCV 87.4 86.8 87.6  PLT 296 263 778   Basic Metabolic Panel: Recent Labs  Lab 10/03/20 2051 10/03/20 2250 10/04/20 0027 10/05/20 0410 10/06/20 0425 10/06/20 1030 10/07/20 0458 10/08/20 0450  NA  --   --  136 139 135  --  137 132*  K  --   --  4.0 3.6 4.1  --  4.1 4.4  CL  --   --  100 98 98  --  100 97*  CO2  --   --  26 32 29  --  28 25  GLUCOSE  --   --  146* 128* 109*  --  128* 135*  BUN  --   --  32* 27* 22*  --  22* 23*  CREATININE  --    < > 1.98* 1.85* 1.45*  --  1.71* 1.47*  CALCIUM  --   --  8.5* 8.6* 8.8*  --  8.4* 8.7*  MG 2.0  --   --  1.5*  --  1.9 1.7  --    < > = values in this interval not displayed.   GFR: Estimated Creatinine Clearance: 59.3 mL/min (A) (by C-G formula based on SCr of 1.47 mg/dL (H)). Liver Function Tests: Recent Labs  Lab 10/03/20 1141 10/03/20 2051  AST 36 31  ALT 41 39  ALKPHOS 130* 110  BILITOT 1.3* 0.8  PROT 6.5 5.7*  ALBUMIN 3.0* 2.6*   Recent Labs  Lab 10/03/20 1141  LIPASE 38   No results for input(s): AMMONIA in the last 168 hours. Coagulation Profile: No results for input(s): INR, PROTIME in the last 168 hours. Cardiac Enzymes: No results for input(s): CKTOTAL, CKMB, CKMBINDEX, TROPONINI in the last 168 hours. BNP (last 3 results) Recent Labs    01/30/20 1645  PROBNP 1,759*   HbA1C: No results for input(s): HGBA1C in the last 72 hours. CBG: Recent Labs  Lab 10/06/20 0522  GLUCAP 167*   Lipid Profile: No results for input(s): CHOL, HDL, LDLCALC, TRIG, CHOLHDL, LDLDIRECT in the last 72 hours. Thyroid Function Tests: No results for input(s): TSH, T4TOTAL, FREET4, T3FREE, THYROIDAB in the last 72 hours. Anemia Panel: No results for input(s): VITAMINB12, FOLATE, FERRITIN, TIBC, IRON, RETICCTPCT in the last 72 hours. Sepsis Labs: No results for input(s):  PROCALCITON, LATICACIDVEN in the last 168 hours.  No results found for this or any previous visit (from the past 240 hour(s)).       Radiology Studies: No results found.      Scheduled Meds: . aspirin EC  81 mg Oral Daily  . atorvastatin  80 mg Oral Daily  . Chlorhexidine Gluconate Cloth  6 each Topical Daily  . clonazePAM  0.5 mg Oral BID  . digoxin  0.125 mg Oral Daily  . docusate sodium  100 mg Oral Daily  . heparin  5,000 Units Subcutaneous Q8H  . losartan  12.5 mg Oral Daily  . multivitamin with minerals   Oral Daily  . pantoprazole  40 mg Oral Daily  . potassium chloride SA  20 mEq Oral Daily  . senna  1 tablet Oral Daily  . sodium chloride flush  10-40 mL Intracatheter Q12H  . sodium chloride flush  3 mL Intravenous Q12H  . torsemide  80 mg Oral Daily   Continuous Infusions: . sodium chloride    . milrinone       LOS: 5 days    Time spent: 35 minutes.     Elmarie Shiley, MD [facility]   If 7PM-7AM, please contact night-coverage [email or web address]  10/08/2020, 8:03 AM

## 2020-10-08 NOTE — Progress Notes (Signed)
1300 Noted pt had loss this morning. Will hold ambulation and let pt have some quiet time. Will continue to follow. Graylon Good RN BSN 10/08/2020 1:01 PM

## 2020-10-08 NOTE — Progress Notes (Signed)
Pharmacy Antibiotic Note  Tyler Wong is a 54 y.o. male admitted on 10/03/2020 with sepsis. Tm102, drop in blood pressure Cr 1.47, CrCl 57ml/min Pharmacy has been consulted for cefepime and vancomycin  dosing.  Plan: Cefepime 2gm q12h Vancomycin 1250mg  q24h AUC 424  Height: 5\' 10"  (177.8 cm) Weight: 73.4 kg (161 lb 13.1 oz) IBW/kg (Calculated) : 73  Temp (24hrs), Avg:99.6 F (37.6 C), Min:97.7 F (36.5 C), Max:102.6 F (39.2 C)  Recent Labs  Lab 10/03/20 1050 10/03/20 1141 10/03/20 2250 10/03/20 2250 10/04/20 0027 10/05/20 0410 10/06/20 0425 10/07/20 0458 10/08/20 0450  WBC 8.2  --  8.4  --   --  9.0  --   --   --   CREATININE  --    < > 2.04*   < > 1.98* 1.85* 1.45* 1.71* 1.47*   < > = values in this interval not displayed.    Estimated Creatinine Clearance: 59.3 mL/min (A) (by C-G formula based on SCr of 1.47 mg/dL (H)).    No Known Allergies  Antimicrobials this admission:   Dose adjustments this admission: Microbiology results: 10/12 BCx:  10/12  UCx:    Bonnita Nasuti Pharm.D. CPP, BCPS Clinical Pharmacist [phone] 10/08/2020 3:47 PM

## 2020-10-08 NOTE — Progress Notes (Signed)
°   10/08/20 1229  Assess: MEWS Score  Temp (!) 102.6 F (39.2 C)  BP 90/64  Pulse Rate 100  ECG Heart Rate (!) 109  Resp 19  Level of Consciousness Alert  SpO2 96 %  O2 Device Room Air  Assess: MEWS Score  MEWS Temp 2  MEWS Systolic 1  MEWS Pulse 1  MEWS RR 0  MEWS LOC 0  MEWS Score 4  MEWS Score Color Red  Take Vital Signs  Increase Vital Sign Frequency  Red: Q 1hr X 4 then Q 4hr X 4, if remains red, continue Q 4hrs  Escalate  MEWS: Escalate Red: discuss with charge nurse/RN and provider, consider discussing with RRT  Notify: Charge Nurse/RN  Name of Charge Nurse/RN Notified Marita [place]  Date Charge Nurse/RN Notified 10/08/20  Time Charge Nurse/RN Notified 8295  Notify: Provider  Provider Name/Title dr Tyrell Antonio  Date Provider Notified 10/08/20  Time Provider Notified 1230  Notification Type Page (text chat)  Notification Reason Change in status  Response See new orders  Date of Provider Response 10/08/20  Time of Provider Response 1250  Document  Patient Outcome Not stable and remains on department  Progress note created (see row info) Yes  new orders rec'd will continue to monitor. Radiology concerned with pt being positve for covid on 9/23. Clarification rec'd from Infection prevention. No isolation needed.

## 2020-10-08 NOTE — Progress Notes (Signed)
Assessed PICC after TPA and was able to pull back blood and flush however it was difficult to flush. The purple port could not be flushed with no blood return. Requesting Chest X Ray at this time due to positional PICC line. The picc line was placed with 2cm out and now the picc line is out 4cm.

## 2020-10-09 ENCOUNTER — Other Ambulatory Visit (HOSPITAL_COMMUNITY): Payer: Self-pay | Admitting: Adult Health

## 2020-10-09 ENCOUNTER — Inpatient Hospital Stay (HOSPITAL_COMMUNITY): Payer: Medicaid Other

## 2020-10-09 DIAGNOSIS — R609 Edema, unspecified: Secondary | ICD-10-CM | POA: Diagnosis not present

## 2020-10-09 DIAGNOSIS — I5023 Acute on chronic systolic (congestive) heart failure: Secondary | ICD-10-CM | POA: Diagnosis not present

## 2020-10-09 DIAGNOSIS — R509 Fever, unspecified: Secondary | ICD-10-CM | POA: Diagnosis not present

## 2020-10-09 DIAGNOSIS — I2510 Atherosclerotic heart disease of native coronary artery without angina pectoris: Secondary | ICD-10-CM | POA: Diagnosis not present

## 2020-10-09 DIAGNOSIS — I5021 Acute systolic (congestive) heart failure: Secondary | ICD-10-CM

## 2020-10-09 LAB — BASIC METABOLIC PANEL
Anion gap: 9 (ref 5–15)
BUN: 28 mg/dL — ABNORMAL HIGH (ref 6–20)
CO2: 23 mmol/L (ref 22–32)
Calcium: 8.8 mg/dL — ABNORMAL LOW (ref 8.9–10.3)
Chloride: 100 mmol/L (ref 98–111)
Creatinine, Ser: 1.71 mg/dL — ABNORMAL HIGH (ref 0.61–1.24)
GFR, Estimated: 44 mL/min — ABNORMAL LOW (ref >60)
Glucose, Bld: 138 mg/dL — ABNORMAL HIGH (ref 70–99)
Potassium: 4.8 mmol/L (ref 3.5–5.1)
Sodium: 132 mmol/L — ABNORMAL LOW (ref 135–145)

## 2020-10-09 MED ORDER — PANTOPRAZOLE SODIUM 40 MG PO TBEC: 40.0000 mg | DELAYED_RELEASE_TABLET | Freq: Every day | ORAL | 6 refills | Status: DC

## 2020-10-09 MED ORDER — TORSEMIDE 20 MG PO TABS: 80.0000 mg | ORAL_TABLET | Freq: Every day | ORAL | 6 refills | Status: DC

## 2020-10-09 MED ORDER — DOXYCYCLINE HYCLATE 100 MG PO TABS: 100.0000 mg | ORAL_TABLET | Freq: Two times a day (BID) | ORAL | 0 refills | Status: DC

## 2020-10-09 MED ORDER — DIGOXIN 125 MCG PO TABS: 0.1250 mg | ORAL_TABLET | Freq: Every day | ORAL | 6 refills | Status: DC

## 2020-10-09 MED ORDER — DOXYCYCLINE HYCLATE 100 MG PO TABS
100.0000 mg | ORAL_TABLET | Freq: Two times a day (BID) | ORAL | Status: DC
  Administered 2020-10-09: 100 mg via ORAL
  Filled 2020-10-09: qty 1

## 2020-10-09 MED ORDER — POTASSIUM CHLORIDE CRYS ER 20 MEQ PO TBCR: 20.0000 meq | EXTENDED_RELEASE_TABLET | Freq: Every day | ORAL | 3 refills | Status: DC

## 2020-10-09 MED ORDER — ATORVASTATIN CALCIUM 80 MG PO TABS: 80.0000 mg | ORAL_TABLET | Freq: Every day | ORAL | 3 refills | Status: DC

## 2020-10-09 MED FILL — DOXYCYCLINE HYCLATE 100 MG: 100 | 7 days supply | Qty: 14 | Fill #0

## 2020-10-09 MED FILL — TORSEMIDE 20 MG TABLET: 20 | 7 days supply | Qty: 28 | Fill #0

## 2020-10-09 MED FILL — PANTOPRAZOLE SOD DR 40 MG T: 40 | 30 days supply | Qty: 30 | Fill #0

## 2020-10-09 MED FILL — ATORVASTATIN CALCIUM 80 MG: 80 | 7 days supply | Qty: 7 | Fill #0

## 2020-10-09 MED FILL — DIGOXIN 0.125 MG TABLET: 125 | 7 days supply | Qty: 7 | Fill #0

## 2020-10-09 MED FILL — POTASSIUM CHLORIDE 20meqER: 20 | 7 days supply | Qty: 7 | Fill #0

## 2020-10-09 NOTE — Progress Notes (Signed)
Patient has small blood clot on Rt. Brachial. NP Amy made aware of it. It's okay to go home. Removed PIV access and received discharge instructions. HS [employer]

## 2020-10-09 NOTE — TOC Initial Note (Signed)
Transition of Care [facility]) - Initial/Assessment Note    Patient Details  Name: Tyler Wong MRN: [id number] Date of Birth: 05-22-1966  [facility]) CM/SW Contact:    Zenon Mayo, RN Phone Number: 10/09/2020, 12:19 PM  Clinical Narrative:                   Expected Discharge Plan: Home/Self Care Barriers to Discharge: No Barriers Identified   Patient Goals and CMS Choice  Patient for dc today, per [name] he does not want any HH services and he does not want outpatient palliative services.  He has no other needs.   Choice offered to / list presented to : NA  Expected Discharge Plan and Services Expected Discharge Plan: Home/Self Care   Discharge Planning Services: CM Consult Post Acute Care Choice: NA Living arrangements for the past 2 months: Single Family Home Expected Discharge Date: 10/09/20                         [facility] Arranged: NA          Prior Living Arrangements/Services Living arrangements for the past 2 months: Single Family Home Lives with:: Other (Comment) Patient language and need for interpreter reviewed:: Yes Do you feel safe going back to the place where you live?: Yes      Need for Family Participation in Patient Care: No (Comment) Care giver support system in place?: No (comment)   Criminal Activity/Legal Involvement Pertinent to Current Situation/Hospitalization: No - Comment as needed  Activities of Daily Living Home Assistive Devices/Equipment: None ADL Screening (condition at time of admission) Patient's cognitive ability adequate to safely complete daily activities?: Yes Is the patient deaf or have difficulty hearing?: No Does the patient have difficulty seeing, even when wearing glasses/contacts?: No Does the patient have difficulty concentrating, remembering, or making decisions?: No Patient able to express need for assistance with ADLs?: Yes Does the patient have difficulty dressing or bathing?: No Independently  performs ADLs?: Yes (appropriate for developmental age) Does the patient have difficulty walking or climbing stairs?: No Weakness of Legs: None Weakness of Arms/Hands: None  Permission Sought/Granted                  Emotional Assessment   Attitude/Demeanor/Rapport: Engaged Affect (typically observed): Appropriate Orientation: : Oriented to Self, Oriented to Place, Oriented to  Time, Oriented to Situation Alcohol / Substance Use: Not Applicable Psych Involvement: No (comment)  Admission diagnosis:  Acute systolic HF (heart failure) ([place]) [I50.21] Acute on chronic systolic CHF (congestive heart failure) ([place]) [I50.23] Patient Active Problem List   Diagnosis Date Noted  . Palliative care by specialist   . Goals of care, counseling/discussion   . Acute systolic HF (heart failure) ([place]) 10/03/2020  . Acute on chronic systolic CHF (congestive heart failure) ([place]) 10/03/2020  . Acute renal failure superimposed on stage 3a chronic kidney disease ([place])   . Acute respiratory disease due to COVID-19 virus 09/20/2020  . Acute respiratory failure due to COVID-19 ([place]) 09/19/2020  . Cardiorenal syndrome 06/29/2020  . 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 Acute on chronic clinical systolic heart failure ([place]) 06/22/2020  . Acute on chronic systolic (congestive) heart failure ([place]) 06/22/2020  . Palliative care encounter   . Cardiogenic shock ([place]) 04/11/2020  . CKD (chronic kidney disease) stage 3, GFR 30-59 ml/min (HCC) 04/11/2020  . Symptomatic anemia 04/09/2020  . Acute on chronic HFrEF (heart failure with reduced ejection fraction) (  [place]) 05/05/2019  . HFrEF (heart failure with reduced ejection fraction) ([place]) 05/04/2019  . Acute combined systolic and diastolic congestive heart failure ([place]) 10/02/2018  . Hepatitis C 04/13/2017  . Current non-adherence to medical treatment 04/13/2017  . Acute on chronic combined systolic (congestive) and diastolic (congestive)  heart failure ([place]) 04/13/2017  . HTN (hypertension) 04/12/2017  . Insomnia 01/18/2017  . Chronic combined systolic and diastolic congestive heart failure ([place]) 01/04/2017  . Hyperlipidemia 01/04/2017  . Chest pain 12/30/2016  . Coronary artery disease involving native heart 07/18/2014  . Tobacco abuse 07/17/2014  . Cocaine use 07/17/2014   PCP:  [name], NP Pharmacy:   [place], [place] - [address] [facility]. [address] [place] [address] Phone: [phone] Fax: [place], [place] - [address] [place] [address] Phone: [phone] Fax: [place] [address], [place] [address] Phone: [phone] Fax: [phone]  [facility] [place] [address] [place] [address] Phone: [phone] Fax: [phone]     Social Determinants of Health (SDOH) Interventions    Readmission Risk Interventions Readmission Risk Prevention Plan 10/09/2020  Transportation Screening Complete  Medication Review ([place]) Complete  HRI or [place] Complete  SW Recovery Care/Counseling Consult Complete  [place] Not Applicable  Some recent data might be hidden

## 2020-10-09 NOTE — Progress Notes (Signed)
Right Upper Ext. study completed.   See CVProc for preliminary results.   Diamon Reddinger, RDMS, RVT 

## 2020-10-09 NOTE — Progress Notes (Addendum)
Advanced Heart Failure Rounding Note  PCP-Cardiologist: Shelva Majestic, MD  [facility]: Dr. 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 Also w/ h/o poor compliance. Previous referred to paramedicine but not fully cooperative. Recently diagnosed w/ COVID 09/19/20.   He is now off milrinone, last co-ox 65%.  Creatinine stable 1.7.    Sad this am as his wife died from [place] 20-Apr-2023 night on [place].  Says he has to go home today to arrange funeral.  Denies dyspnea.    Objective:   Weight Range: 72.3 kg Body mass index is 22.87 kg/m.   Vital Signs:   Temp:  [98 F (36.7 C)-102.6 F (39.2 C)] 98.7 F (37.1 C) (10/13 0503) Pulse Rate:  [95-103] 100 (10/13 0503) Resp:  [17-29] 20 (10/13 0503) BP: (81-118)/(48-97) 93/76 (10/13 0503) SpO2:  [95 %-98 %] 98 % (10/13 0503) Weight:  [72.3 kg] 72.3 kg (10/13 0614) Last BM Date: 10/08/20  Weight change: Filed Weights   10/07/20 0447 10/08/20 0500 10/09/20 0614  Weight: 72.2 kg 73.4 kg 72.3 kg    Intake/Output:   Intake/Output Summary (Last 24 hours) at 10/09/2020 0735 Last data filed at 10/09/2020 0500 Gross per 24 hour  Intake 1164.9 ml  Output 3350 ml  Net -2185.1 ml      Physical Exam  General:  No resp difficulty HEENT: normal Neck: supple. JVP 7-8 . Carotids 2+ bilat; no bruits. No lymphadenopathy or thryomegaly appreciated. Cor: PMI nondisplaced. Regular rate & rhythm. No rubs,or murmurs. + S3  Lungs: clear Abdomen: soft, nontender, nondistended. No hepatosplenomegaly. No bruits or masses. Good bowel sounds. Extremities: no cyanosis, clubbing, rash, edema Neuro: alert & orientedx3, cranial nerves grossly intact. moves all 4 extremities w/o difficulty. Affect  pleasant   Telemetry   Sinus Tach 100s   Labs    CBC Recent Labs    10/08/20 1509  WBC 6.5  HGB 12.3*  HCT 36.9*  MCV 86.2  PLT 098   Basic Metabolic Panel Recent Labs    10/06/20 1030 10/07/20 0458 10/07/20 0458 10/08/20 0450 10/09/20 0022  NA  --  137   < > 132* 132*  K  --  4.1   < > 4.4 4.8  CL  --  100   < > 97* 100  CO2  --  28   < > 25 23  GLUCOSE  --  128*   < > 135* 138*  BUN  --  22*   < > 23* 28*  CREATININE  --  1.71*   < > 1.47* 1.71*  CALCIUM  --  8.4*   < > 8.7* 8.8*  MG 1.9 1.7  --   --   --    < > = values in this interval not displayed.   Liver Function Tests Recent Labs    10/08/20 1509  AST 20  ALT 22  ALKPHOS 81  BILITOT 1.6*  PROT 5.9*  ALBUMIN 2.5*   No results for input(s): LIPASE, AMYLASE in the last 72 hours. Cardiac Enzymes No results for input(s): CKTOTAL, CKMB, CKMBINDEX, TROPONINI in the last 72 hours.  BNP: BNP (last 3 results) Recent Labs    09/19/20 2058 09/21/20 0138 10/03/20 1222  BNP 478.3* 578.1*  2,190.9*    ProBNP (last 3 results) Recent Labs    01/30/20 1645  PROBNP 1,759*     D-Dimer No results for input(s): DDIMER in the last 72 hours. Hemoglobin A1C No results for input(s): HGBA1C in the last 72 hours. Fasting Lipid Panel No results for input(s): CHOL, HDL, LDLCALC, TRIG, CHOLHDL, LDLDIRECT in the last 72 hours. Thyroid Function Tests No results for input(s): TSH, T4TOTAL, T3FREE, THYROIDAB in the last 72 hours.  Invalid input(s): FREET3  Other results:   Imaging    DG CHEST PORT 1 VIEW  Result Date: 10/08/2020 CLINICAL DATA:  PICC placement EXAM: PORTABLE CHEST 1 VIEW COMPARISON:  10/03/2020 FINDINGS: Right PICC line tip overlies the SVC. Pulmonary vascular congestion. No pleural effusion. Cardiomegaly. No pneumothorax. IMPRESSION: PICC line tip overlies SVC. Pulmonary vascular congestion and cardiomegaly. Electronically Signed   By: Macy Mis M.D.   On: 10/08/2020 11:04      Medications:     Scheduled Medications: . aspirin EC  81 mg Oral Daily  . atorvastatin  80 mg Oral Daily  . Chlorhexidine Gluconate Cloth  6 each Topical Daily  . clonazePAM  0.5 mg Oral BID  . digoxin  0.125 mg Oral Daily  . docusate sodium  100 mg Oral Daily  . heparin  5,000 Units Subcutaneous Q8H  . multivitamin with minerals   Oral Daily  . pantoprazole  40 mg Oral Daily  . potassium chloride SA  20 mEq Oral Daily  . senna  1 tablet Oral Daily  . sodium chloride flush  10-40 mL Intracatheter Q12H  . sodium chloride flush  3 mL Intravenous Q12H  . torsemide  80 mg Oral Daily    Infusions: . sodium chloride    . ceFEPime (MAXIPIME) IV 200 mL/hr at 10/09/20 0500  . vancomycin      PRN Medications: sodium chloride, acetaminophen, albuterol, alum & mag hydroxide-simeth, nitroGLYCERIN, ondansetron (ZOFRAN) IV, sodium chloride flush, sodium chloride flush   Assessment/Plan   1. Acute on chronic Systolic HF w/ Presumed Low-output: Echo in 2015 with EF 40-45%, thought to be ischemic cardiomyopathy. Cath in 10/19 showed 90% ostial OM1 and 70% PLV, no interventional target. Suspect mixed ischemic/nonischemic cardiomyopathy. 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 Multiple re-admits for cardiogenic shock in the last 6 months requiring milrinone. Unfortunately, not a candidate for advanced therapies or home milrinone with active use of cocaine.He is also now homeless. He has been started on empiric milrinone this admit - He has end-stage HF.   - Given social situation he is not a candidate for any advanced therapies including home milrinone and I explained this again to him.  Off milrinone. PICC pulled due to fever.  - Volume status stable. Continue torsemide 80 mg daily.   - Continue digoxin - No other HF meds with SBP 90s.  - Palliative Care appreciated though he remains resistant to their help at this time   2. CAD: H/o anterior STEMI with DES to LAD in 7/15.As above, cath in 10/19 with 90% ostial OM1 and 70% PLV, no interventional target.  - NO chest pain.  -ContinueASA 81 and atorvastatin 80 mg daily.   3. Cocaine abuse: continues to use cocaine. UDS +.   - question having WD symptoms  4. Active smoker: Counseled to quit.  5. AKI:  - Scr 2.08 on admit, baseline ~1.6 - AKI 2/2 low ouput/ CHF - Creatinine 1.7 today.   6. COVID Infection  - tested + 09/19/20 - no active symptoms   7. Mitral Regurgitation - moderate to severe functional MR  8. [place] - Palliative Care following.   9. Social:  Plans to return to his daughters house.   10. Fever  10/08/20. PICC removed with no further fevers. UA negative.  Given vanc + cefeipime.  Blood Cx obtained. NGTD in left hand. L arm gram + rods. May be contaminant.  - CXR ok.  - He will need 30 day supply of meds prior to discharge. He is homeless.   Wants to go home to arrange girlfriends funeral.  Will need 30 day supply of HF meds. He asked that the f/u appointment be set up in few weeks.   Length of Stay: [place], NP  10/09/2020, 7:35 AM  Advanced Heart Failure Team Pager [phone] (M-F; 7a - 4p)  Please contact [facility] for night-coverage after hours (4p -7a ) and weekends on [email or web address]  Patient seen with NP, agree with the above note.   Insists that he must go home today to arrange for girlfriend's funeral.  He is now off milrinone.  Limited on started other GDMT due to soft BP, only on digoxin 0.125 daily and torsemide.    Fever yesterday afternoon, resolved after pulling PICC line.  UA negative, CXR negative. Blood cultures NGTD.   General: NAD Neck: JVP 7-8 cm, no thyromegaly or thyroid nodule.  Lungs: Clear to auscultation bilaterally with normal respiratory  effort. CV: Lateral PMI.  Heart regular S1/S2, no S3/S4, 2/6 HSM apex.  No peripheral edema.   Abdomen: Soft, nontender, no hepatosplenomegaly, no distention.  Skin: Intact without lesions or rashes.  Neurologic: Alert and oriented x 3.  Psych: Normal affect. Extremities: No clubbing or cyanosis.  HEENT: Normal.   I will let him go home today to arrange funeral.  Will need close followup in CHF clinic.  With ongoing active cocaine use, he is not candidate for advanced therapies or home milrinone despite milrinone-dependent HF on last several admissions.  Will send home on torsemide and digoxin.   Will give 1 week of doxycycline for fever of unknown origin, ?due to PICC.   Loralie Champagne 10/09/2020 8:11 AM

## 2020-10-09 NOTE — TOC Transition Note (Signed)
Transition of Care [facility]) - CM/SW Discharge Note   Patient Details  Name: Branton Einstein MRN: [id number] Date of Birth: 1966/06/06  [facility]) CM/SW Contact:  Zenon Mayo, RN Phone Number: 10/09/2020, 12:20 PM   Clinical Narrative:    Patient for dc today, per [name] he does not want any HH services and he does not want outpatient palliative services.  He has no other needs.   Final next level of care: Home/Self Care Barriers to Discharge: No Barriers Identified   Patient Goals and CMS Choice     Choice offered to / list presented to : NA  Discharge Placement                       Discharge Plan and Services   Discharge Planning Services: CM Consult Post Acute Care Choice: NA            DME Agency: NA       HH Arranged: NA          Social Determinants of Health (SDOH) Interventions     Readmission Risk Interventions Readmission Risk Prevention Plan 10/09/2020  Transportation Screening Complete  Medication Review Press photographer) Complete  HRI or [place] Complete  SW Recovery Care/Counseling Consult Complete  [name] Not Applicable  Some recent data might be hidden

## 2020-10-09 NOTE — Progress Notes (Signed)
PHARMACY - PHYSICIAN COMMUNICATION CRITICAL VALUE ALERT - BLOOD CULTURE IDENTIFICATION (BCID)  Tyler Wong is an 54 y.o. male who presented to [facility] on 10/03/2020 with a chief complaint of acute on chronic systolic heart failure  85/99 Blood>>1/4 bottles gram positive rods   Assessment: Tmax 102.6, last temp 98.7, WBC WNL  Name of physician (or Provider) Contacted: Dr. Cyd Silence  Current antibiotics: Vancomycin/Cefepime  Changes to prescribed antibiotics recommended:  No changes for now  No results found for this or any previous visit.  Narda Bonds 10/09/2020  6:06 AM

## 2020-10-09 NOTE — Discharge Summary (Addendum)
Advanced Heart Failure Team  Discharge Summary   Patient ID: Tyler Wong MRN: [id number], DOB/AGE: 1966-01-25 55 y.o. Admit date: 10/03/2020 D/C date:     10/09/2020   Primary Discharge Diagnoses: 1. Acute on chronic Systolic HF w/ Presumed Low-output 2. CAD 3. Cocaine abuse 4. Active smoker 5. AKI:  6. COVID Infection  7. Mitral Regurgitation 8. GOC 9. Social  10. Fever   Hospital Course:  Tyler Wong is a 54 y.o. male with hx of CAD- remote stent, DES to LAD, 2015, HTN, cocaine, chronic systolic HF followed by Dr. Aundra Dubin, recent [place] with admit 09/19/20 and discharge 09/21/20.   Admitted with A/C systolic heart failure and now arrived with SOB and abd pain by EMS . He had been out of his HF meds > 1 week. On 10/09/20 he was adamant he needed to be discharged because he needed arrange his girlfriends funeral. She died of COVID complications earlier this week.   We will set up follow up in the HF clinic.See below for detailed problem list,    1. Acute on chronic Systolic HF w/ Presumed Low-output: Echo in 2015 with EF 40-45%, thought to be ischemic cardiomyopathy. Cath in 10/19 showed 90% ostial OM1 and 70% PLV, no interventional target. Suspect mixed ischemic/nonischemic cardiomyopathy. 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 Unfortunately, not a candidate for advanced therapies or home milrinone with active use of cocaine.He is also now homeless. He has been started on empiric milrinone this admit - He has end-stage HF so he was not a candidate for candidate for any advanced therapies including home milrinone and I explained this again to him.  PICC placed and started on empiric milrinone.  Once diuresed he was transitioned off milrinone and placed on torsemide.  - GDMT limited due to hypotension.  - Continue digoxin - Palliative Care consulted though he remains resistant to their help at this time  2. CAD: H/o anterior STEMI with DES to LAD in 7/15.As above, cath in 10/19 with 90% ostial OM1 and 70% PLV, no interventional target.  - NO chest pain.  -ContinueASA 81 and atorvastatin 80 mg daily.  3. Cocaine abuse:continues to use cocaine. UDS + on admit.  4. Active smoker: Counseled to quit. 5. AKI:  - Scr 2.08 on admit, baseline ~1.6 - AKI 2/2 low ouput/ CHF - Renal function followed closely.  6. COVID Infection  - tested + 09/19/20 - no active symptoms  7. Mitral Regurgitation - moderate to severe functional MR 8. [place] - Palliative Care following.  9. Social:  Plans to return to his daughters house.  10. Fever  10/08/20. PICC removed with no further fevers. UA negative.  Given vanc + cefepime. Blood Cx obtained. NGTD in left hand. L arm gram + rods but suspect contaminant. Discussed with Dr Aundra Dubin and will d/c with doxycyline 100 mg twice a day x 7 days.  - RUE doppler- small right brachial dvt that was non occlusive.  - CXR ok.    Discharge Vitals: Blood pressure 93/76, pulse 100, temperature 98.7 F (37.1 C), temperature source Oral, resp. rate 20, height 5\' 10"  (1.778 m), weight 72.3 kg, SpO2 98 %.  Labs: Lab Results  Component Value Date   WBC 6.5 10/08/2020   HGB 12.3 (  L) 10/08/2020   HCT 36.9 (L) 10/08/2020   MCV 86.2 10/08/2020   PLT 175 10/08/2020    Recent Labs  Lab 10/08/20 0450 10/08/20 1509 10/09/20 0022  NA   < >  --  132*  K   < >  --  4.8  CL   < >  --  100  CO2   < >  --  23  BUN   < >  --  28*  CREATININE   < >  --  1.71*  CALCIUM   < >  --  8.8*  PROT  --  5.9*  --   BILITOT  --  1.6*  --   ALKPHOS  --  81  --   ALT  --  22  --   AST  --  20  --   GLUCOSE   < >  --  138*   < > = values in this interval not  displayed.   Lab Results  Component Value Date   CHOL 191 12/31/2016   HDL 54 12/31/2016   LDLCALC 112 (H) 12/31/2016   TRIG 52 09/19/2020   BNP (last 3 results) Recent Labs    09/19/20 2058 09/21/20 0138 10/03/20 1222  BNP 478.3* 578.1* 2,190.9*    ProBNP (last 3 results) Recent Labs    01/30/20 1645  PROBNP 1,759*     Diagnostic Studies/Procedures   DG CHEST PORT 1 VIEW  Result Date: 10/08/2020 CLINICAL DATA:  PICC placement EXAM: PORTABLE CHEST 1 VIEW COMPARISON:  10/03/2020 FINDINGS: Right PICC line tip overlies the SVC. Pulmonary vascular congestion. No pleural effusion. Cardiomegaly. No pneumothorax. IMPRESSION: PICC line tip overlies SVC. Pulmonary vascular congestion and cardiomegaly. Electronically Signed   By: Macy Mis M.D.   On: 10/08/2020 11:04    Discharge Medications   Allergies as of 10/09/2020   No Known Allergies     Medication List    STOP taking these medications   dexamethasone 6 MG tablet Commonly known as: DECADRON   empagliflozin 10 MG Tabs tablet Commonly known as: Jardiance   isosorbide-hydrALAZINE 20-37.5 MG tablet Commonly known as: BIDIL   Nitrostat 0.4 MG SL tablet Generic drug: nitroGLYCERIN   omeprazole 20 MG capsule Commonly known as: PRILOSEC Replaced by: pantoprazole 40 MG tablet     TAKE these medications   albuterol 108 (90 Base) MCG/ACT inhaler Commonly known as: VENTOLIN HFA Inhale 2 puffs into the lungs every 4 (four) hours as needed for wheezing or shortness of breath.   aspirin 81 MG EC tablet Take 1 tablet (81 mg total) by mouth daily.   atorvastatin 80 MG tablet Commonly known as: LIPITOR Take 1 tablet (80 mg total) by mouth daily.   digoxin 0.125 MG tablet Commonly known as: LANOXIN Take 1 tablet (0.125 mg total) by mouth daily.   doxycycline 100 MG tablet Commonly known as: VIBRA-TABS Take 1 tablet (100 mg total) by mouth every 12 (twelve) hours.   multivitamin tablet Take 1 tablet by  mouth daily.   pantoprazole 40 MG tablet Commonly known as: PROTONIX Take 1 tablet (40 mg total) by mouth daily. Replaces: omeprazole 20 MG capsule   potassium chloride SA 20 MEQ tablet Commonly known as: KLOR-CON Take 1 tablet (20 mEq total) by mouth daily.   torsemide 20 MG tablet Commonly known as: DEMADEX Take 4 tablets (80 mg total) by mouth daily. What changed:   how much to take  how to take this  when to take  this  additional instructions       Disposition   The patient will be discharged in stable condition to home. Discharge Instructions    Diet - low sodium heart healthy   Complete by: As directed    Heart Failure patients record your daily weight using the same scale at the same time of day   Complete by: As directed    Increase activity slowly   Complete by: As directed       Follow-up Information    [place] Follow up on 10/24/2020.   Specialty: Cardiology Why: at 1000 Contact information: [address] [id number] [place] [phone]                Duration of Discharge Encounter: Greater than 35 minutes   Signed, Darrick Grinder NP-C  10/09/2020, 8:08 AM

## 2020-10-09 NOTE — Progress Notes (Signed)
PROGRESS NOTE    Tyler Wong  TZG:[id number] DOB: 02-05-1966 DOA: 10/03/2020 PCP: [name], NP   Brief Narrative:  Patient admitted with CHF exac by cardiology. Found to have a fever. UA and CXR showed no infection. Blood culture showed GPC which is likely a contaminant.  Patient has been afebrile. PICC was removed as well.  Assessment & Plan   Fever -Patient did spike a fever, however has been afebrile since -UA and chest x-ray were unremarkable for infection -Blood culture showed 1 bottle with GPC, suspect contaminant -Patient was placed on vancomycin and ceftriaxone -PICC line was removed  Acute on chronic systolic heart failure -EF was 20% in April 2021 -Echocardiogram 10/04/2020 shows an EF of 20 to 75%, grade 2 diastolic dysfunction -Cardiology currently managing  Acute kidney injury on chronic kidney disease, stage IIIa -creatinine was up to 2.04, down to 1.7  Hypomagnesemia  CAD -Chest pain-free -Continue aspirin, statin  Cocaine abuse -Counseling was provided  Bronchitis -Continue nebulizer treatments as needed  COVID-19 infection -Currently off of isolation   DVT Prophylaxis  Heparin  Code Status: Full  Family Communication: None at bedside  Disposition Plan:  Status is: Inpatient Disposition per primary team.  Patient being discharged today    Consultants TRH  Procedures  echo  Antibiotics   Anti-infectives (From admission, onward)   Start     Dose/Rate Route Frequency Ordered Stop   10/09/20 1600  vancomycin (VANCOREADY) IVPB 1250 mg/250 mL        1,250 mg 166.7 mL/hr over 90 Minutes Intravenous Every 24 hours 10/08/20 1544     10/09/20 1000  doxycycline (VIBRA-TABS) tablet 100 mg        100 mg Oral Every 12 hours 10/09/20 0801 10/16/20 0959   10/09/20 0000  doxycycline (VIBRA-TABS) 100 MG tablet        100 mg Oral Every 12 hours 10/09/20 0805     10/08/20 1630  ceFEPIme (MAXIPIME) 2 g in sodium chloride 0.9 % 100 mL IVPB         2 g 200 mL/hr over 30 Minutes Intravenous Every 12 hours 10/08/20 1541     10/08/20 1630  vancomycin (VANCOREADY) IVPB 1250 mg/250 mL        1,250 mg 166.7 mL/hr over 90 Minutes Intravenous  Once 10/08/20 1541 10/08/20 2009   10/08/20 1500  cefTRIAXone (ROCEPHIN) 1 g in sodium chloride 0.9 % 100 mL IVPB  Status:  Discontinued        1 g 200 mL/hr over 30 Minutes Intravenous Every 24 hours 10/08/20 1423 10/08/20 1526      Subjective:   Tyler Wong seen and examined today.  Feels breathing has improved and states he wants to go home.  Denies current chest pain, abdominal pain, nausea or vomiting, diarrhea or constipation, dizziness or headache. Objective:   Vitals:   10/09/20 0005 10/09/20 0503 10/09/20 0614 10/09/20 0854  BP: 99/70 93/76  96/77  Pulse:  100    Resp:  20  (!) 24  Temp:  98.7 F (37.1 C)  98.4 F (36.9 C)  TempSrc:  Oral  Oral  SpO2:  98%  96%  Weight:   72.3 kg   Height:        Intake/Output Summary (Last 24 hours) at 10/09/2020 0953 Last data filed at 10/09/2020 0935 Gross per 24 hour  Intake 1167.9 ml  Output 2950 ml  Net -1782.1 ml   Filed Weights   10/07/20 0447 10/08/20 0500 10/09/20  0614  Weight: 72.2 kg 73.4 kg 72.3 kg    Exam  General: Well developed, thin, NAD  HEENT: NCAT, mucous membranes moist.   Cardiovascular: S1 S2 auscultated, RRR  Respiratory: Clear to auscultation bilaterally, no wheezing  Abdomen: Soft, nontender, nondistended, + bowel sounds  Extremities: warm dry without cyanosis clubbing or edema  Neuro: AAOx3, nonfocal  Psych: appropriate   Data Reviewed: I have personally reviewed following labs and imaging studies  CBC: Recent Labs  Lab 10/03/20 1050 10/03/20 2250 10/05/20 0410 10/08/20 1509  WBC 8.2 8.4 9.0 6.5  HGB 12.6* 12.7* 12.5* 12.3*  HCT 38.8* 38.7* 38.2* 36.9*  MCV 87.4 86.8 87.6 86.2  PLT 296 263 241 161   Basic Metabolic Panel: Recent Labs  Lab 10/03/20 2051 10/03/20 2250  10/05/20 0410 10/06/20 0425 10/06/20 1030 10/07/20 0458 10/08/20 0450 10/09/20 0022  NA  --    < > 139 135  --  137 132* 132*  K  --    < > 3.6 4.1  --  4.1 4.4 4.8  CL  --    < > 98 98  --  100 97* 100  CO2  --    < > 32 29  --  28 25 23   GLUCOSE  --    < > 128* 109*  --  128* 135* 138*  BUN  --    < > 27* 22*  --  22* 23* 28*  CREATININE  --    < > 1.85* 1.45*  --  1.71* 1.47* 1.71*  CALCIUM  --    < > 8.6* 8.8*  --  8.4* 8.7* 8.8*  MG 2.0  --  1.5*  --  1.9 1.7  --   --    < > = values in this interval not displayed.   GFR: Estimated Creatinine Clearance: 50.5 mL/min (A) (by C-G formula based on SCr of 1.71 mg/dL (H)). Liver Function Tests: Recent Labs  Lab 10/03/20 1141 10/03/20 2051 10/08/20 1509  AST 36 31 20  ALT 41 39 22  ALKPHOS 130* 110 81  BILITOT 1.3* 0.8 1.6*  PROT 6.5 5.7* 5.9*  ALBUMIN 3.0* 2.6* 2.5*   Recent Labs  Lab 10/03/20 1141  LIPASE 38   No results for input(s): AMMONIA in the last 168 hours. Coagulation Profile: No results for input(s): INR, PROTIME in the last 168 hours. Cardiac Enzymes: No results for input(s): CKTOTAL, CKMB, CKMBINDEX, TROPONINI in the last 168 hours. BNP (last 3 results) Recent Labs    01/30/20 1645  PROBNP 1,759*   HbA1C: No results for input(s): HGBA1C in the last 72 hours. CBG: Recent Labs  Lab 10/06/20 0522  GLUCAP 167*   Lipid Profile: No results for input(s): CHOL, HDL, LDLCALC, TRIG, CHOLHDL, LDLDIRECT in the last 72 hours. Thyroid Function Tests: No results for input(s): TSH, T4TOTAL, FREET4, T3FREE, THYROIDAB in the last 72 hours. Anemia Panel: No results for input(s): VITAMINB12, FOLATE, FERRITIN, TIBC, IRON, RETICCTPCT in the last 72 hours. Urine analysis:    Component Value Date/Time   COLORURINE YELLOW 10/08/2020 1510   APPEARANCEUR CLEAR 10/08/2020 1510   LABSPEC 1.008 10/08/2020 1510   PHURINE 7.0 10/08/2020 1510   GLUCOSEU NEGATIVE 10/08/2020 1510   HGBUR NEGATIVE 10/08/2020 1510    BILIRUBINUR NEGATIVE 10/08/2020 [place] 10/08/2020 1510   PROTEINUR NEGATIVE 10/08/2020 1510   NITRITE NEGATIVE 10/08/2020 1510   LEUKOCYTESUR NEGATIVE 10/08/2020 1510   Sepsis Labs: @LABRCNTIP (procalcitonin:4,lacticidven:4)  ) Recent Results (from  the past 240 hour(s))  Culture, blood (routine x 2)     Status: None (Preliminary result)   Collection Time: 10/08/20  3:10 PM   Specimen: BLOOD LEFT HAND  Result Value Ref Range Status   Specimen Description BLOOD LEFT HAND  Final   Special Requests   Final    BOTTLES DRAWN AEROBIC AND ANAEROBIC Blood Culture adequate volume   Culture   Final    NO GROWTH < 24 HOURS Performed at [facility], [place] [address] [place], [place] [address]    Report Status PENDING  Incomplete  Culture, blood (routine x 2)     Status: None (Preliminary result)   Collection Time: 10/08/20  3:20 PM   Specimen: BLOOD LEFT ARM  Result Value Ref Range Status   Specimen Description BLOOD LEFT ARM  Final   Special Requests   Final    BOTTLES DRAWN AEROBIC AND ANAEROBIC Blood Culture adequate volume   Culture  Setup Time   Final    IN BOTH AEROBIC AND ANAEROBIC BOTTLES GRAM POSITIVE RODS CRITICAL RESULT CALLED TO, READ BACK BY AND VERIFIED WITHKarsten Ro [facility] 10/09/20 0602 JDW Performed at [facility], [place] [address] [place], [place] [address]    Culture GRAM POSITIVE RODS  Final   Report Status PENDING  Incomplete      Radiology Studies: DG CHEST PORT 1 VIEW  Result Date: 10/08/2020 CLINICAL DATA:  PICC placement EXAM: PORTABLE CHEST 1 VIEW COMPARISON:  10/03/2020 FINDINGS: Right PICC line tip overlies the SVC. Pulmonary vascular congestion. No pleural effusion. Cardiomegaly. No pneumothorax. IMPRESSION: PICC line tip overlies SVC. Pulmonary vascular congestion and cardiomegaly. Electronically Signed   By: Macy Mis M.D.   On: 10/08/2020 11:04     Scheduled Meds: . aspirin EC  81 mg Oral Daily  . atorvastatin   80 mg Oral Daily  . Chlorhexidine Gluconate Cloth  6 each Topical Daily  . clonazePAM  0.5 mg Oral BID  . digoxin  0.125 mg Oral Daily  . docusate sodium  100 mg Oral Daily  . doxycycline  100 mg Oral Q12H  . heparin  5,000 Units Subcutaneous Q8H  . multivitamin with minerals   Oral Daily  . pantoprazole  40 mg Oral Daily  . potassium chloride SA  20 mEq Oral Daily  . senna  1 tablet Oral Daily  . sodium chloride flush  10-40 mL Intracatheter Q12H  . sodium chloride flush  3 mL Intravenous Q12H  . torsemide  80 mg Oral Daily   Continuous Infusions: . sodium chloride    . ceFEPime (MAXIPIME) IV 200 mL/hr at 10/09/20 0500  . vancomycin       LOS: 6 days   Time Spent in minutes   30 minutes  Braylen Staller D.O. on 10/09/2020 at 9:53 AM  Between 7am to 7pm - Please see pager noted on [email or web address]  After 7pm go to [email or web address]  And look for the night coverage person covering for me after hours  [facility] Office  [phone]

## 2020-10-10 ENCOUNTER — Telehealth: Payer: Self-pay

## 2020-10-10 NOTE — Telephone Encounter (Signed)
Transition Care Management Follow-up Telephone Call  Date of discharge and from where: 10/09/2020, [facility]   Called pt , Unable to reach left VM to call back .

## 2020-10-11 LAB — CULTURE, BLOOD (ROUTINE X 2): Special Requests: ADEQUATE

## 2020-10-12 LAB — CULTURE, BLOOD (ROUTINE X 2): Special Requests: ADEQUATE

## 2020-10-14 ENCOUNTER — Telehealth: Payer: Self-pay

## 2020-10-14 NOTE — Telephone Encounter (Signed)
Note     Transition Care Management Follow-up Telephone Call  Date of discharge and from where:10/09/2020, [facility]  2 nd call / Called pt , call answered when asked name and  to verify DOB, call hanged up. Reached again , same thing happened.

## 2020-10-16 ENCOUNTER — Other Ambulatory Visit: Payer: Self-pay | Admitting: *Deleted

## 2020-10-16 NOTE — Patient Outreach (Signed)
Care Coordination  10/16/2020  Hanif Radin 1966-08-23 [id number]  An unsuccessful telephone outreach was attempted today. The patient was referred to the case management team for assistance with care management and care coordination.   Follow Up Plan: A HIPAA compliant phone message was left for the patient providing contact information and requesting a return call.  The Managed Medicaid care management team will reach out to the patient again over the next 7 days.   Lurena Joiner RN, BSN [place]  [employer] manager

## 2020-10-16 NOTE — Patient Instructions (Signed)
Visit Information  Mr. [name]  - as a part of your Medicaid benefit, you are eligible for care management and care coordination services at no cost or copay. I was unable to reach you by phone today but would be happy to help you with your health related needs. Please feel free to call me @ [phone].   A member of the Managed Medicaid care management team will reach out to you again over the next 7 days. 10/25/20 @ 3:30pm  Lurena Joiner RN, BSN [place] RN Care Coordinator

## 2020-10-21 ENCOUNTER — Telehealth: Payer: Self-pay

## 2020-10-21 NOTE — Telephone Encounter (Signed)
Letter sent to patient requesting he call [facility] to schedule a follow up appointment as we have not been able to reach him,

## 2020-10-22 ENCOUNTER — Other Ambulatory Visit (INDEPENDENT_AMBULATORY_CARE_PROVIDER_SITE_OTHER): Payer: Self-pay | Admitting: Primary Care

## 2020-10-22 NOTE — Telephone Encounter (Signed)
Requested medication (s) are due for refill today: yes  Requested medication (s) are on the active medication list: no  Last refill: ?  Future visit scheduled: no  Notes to clinic:  d/c'd on 10/09/20 per hospital discharge orders    Requested Prescriptions  Pending Prescriptions Disp Refills   omeprazole (PRILOSEC) 20 MG capsule [Pharmacy Med Name: OMEPRAZOLE  20 MG DR CAPSULE] 30 capsule     Sig: TAKE ONE CAPSULE BY MOUTH ONCE DAILY      Gastroenterology: Proton Pump Inhibitors Passed - 10/22/2020  9:55 AM      Passed - Valid encounter within last 12 months    Recent Outpatient Visits           9 months ago Prediabetes   [place] [name], Tyler Wong   1 year ago Short of breath on exertion   [name], Tyler Cage, Tyler Wong   2 years ago Hyperlipidemia, unspecified hyperlipidemia type   [name], Tyler Wong, Tyler Wong

## 2020-10-23 ENCOUNTER — Telehealth (HOSPITAL_COMMUNITY): Payer: Self-pay | Admitting: Licensed Clinical Social Worker

## 2020-10-23 NOTE — Telephone Encounter (Signed)
CSW received call from patient requesting inpatient rehab resources.  CSW sent list of local rehab options to patient to patient who will plan to make calls and follow up.  CSW also encouraged pt to go to [facility] for assessment and assistance getting into intensive outpatient as when CSW called inpatient options they had a long wait at this time.  Will continue to follow and assist as needed  [name], [facility] Desk#: [phone] Cell#: [phone]

## 2020-10-24 ENCOUNTER — Ambulatory Visit (HOSPITAL_COMMUNITY)
Admit: 2020-10-24 | Discharge: 2020-10-24 | Disposition: A | Discharge status: Home / Self Care | Payer: Medicaid Other | Source: Ambulatory Visit | Attending: Cardiology | Admitting: Cardiology

## 2020-10-24 ENCOUNTER — Encounter (HOSPITAL_COMMUNITY): Payer: Self-pay

## 2020-10-24 ENCOUNTER — Encounter (HOSPITAL_COMMUNITY): Payer: Self-pay | Admitting: Emergency Medicine

## 2020-10-24 ENCOUNTER — Telehealth: Payer: Self-pay | Admitting: Primary Care

## 2020-10-24 ENCOUNTER — Ambulatory Visit (HOSPITAL_COMMUNITY)
Admission: EM | Admit: 2020-10-24 | Discharge: 2020-10-24 | Disposition: A | Discharge status: Other Acute Inpatient Hospital | Payer: Self-pay

## 2020-10-24 ENCOUNTER — Other Ambulatory Visit: Payer: Self-pay

## 2020-10-24 VITALS — BP 98/80 | HR 107 | Wt 166.0 lb

## 2020-10-24 DIAGNOSIS — Z79899 Other long term (current) drug therapy: Secondary | ICD-10-CM | POA: Diagnosis not present

## 2020-10-24 DIAGNOSIS — I5022 Chronic systolic (congestive) heart failure: Secondary | ICD-10-CM | POA: Diagnosis present

## 2020-10-24 DIAGNOSIS — I2510 Atherosclerotic heart disease of native coronary artery without angina pectoris: Secondary | ICD-10-CM | POA: Diagnosis not present

## 2020-10-24 DIAGNOSIS — Z7982 Long term (current) use of aspirin: Secondary | ICD-10-CM | POA: Diagnosis not present

## 2020-10-24 DIAGNOSIS — I340 Nonrheumatic mitral (valve) insufficiency: Secondary | ICD-10-CM | POA: Diagnosis not present

## 2020-10-24 DIAGNOSIS — Z955 Presence of coronary angioplasty implant and graft: Secondary | ICD-10-CM | POA: Insufficient documentation

## 2020-10-24 DIAGNOSIS — T501X6A Underdosing of loop [high-ceiling] diuretics, initial encounter: Secondary | ICD-10-CM | POA: Insufficient documentation

## 2020-10-24 DIAGNOSIS — Z8249 Family history of ischemic heart disease and other diseases of the circulatory system: Secondary | ICD-10-CM | POA: Diagnosis not present

## 2020-10-24 DIAGNOSIS — I130 Hypertensive heart and chronic kidney disease with heart failure and stage 1 through stage 4 chronic kidney disease, or unspecified chronic kidney disease: Secondary | ICD-10-CM | POA: Diagnosis not present

## 2020-10-24 DIAGNOSIS — N183 Chronic kidney disease, stage 3 unspecified: Secondary | ICD-10-CM | POA: Insufficient documentation

## 2020-10-24 DIAGNOSIS — I5042 Chronic combined systolic (congestive) and diastolic (congestive) heart failure: Secondary | ICD-10-CM | POA: Diagnosis not present

## 2020-10-24 DIAGNOSIS — F17210 Nicotine dependence, cigarettes, uncomplicated: Secondary | ICD-10-CM | POA: Insufficient documentation

## 2020-10-24 DIAGNOSIS — I252 Old myocardial infarction: Secondary | ICD-10-CM | POA: Insufficient documentation

## 2020-10-24 DIAGNOSIS — Z91138 Patient's unintentional underdosing of medication regimen for other reason: Secondary | ICD-10-CM | POA: Insufficient documentation

## 2020-10-24 DIAGNOSIS — Z8616 Personal history of COVID-19: Secondary | ICD-10-CM | POA: Diagnosis not present

## 2020-10-24 DIAGNOSIS — I5084 End stage heart failure: Secondary | ICD-10-CM | POA: Insufficient documentation

## 2020-10-24 DIAGNOSIS — Z634 Disappearance and death of family member: Secondary | ICD-10-CM | POA: Insufficient documentation

## 2020-10-24 DIAGNOSIS — Z5900 Homelessness unspecified: Secondary | ICD-10-CM | POA: Diagnosis not present

## 2020-10-24 DIAGNOSIS — F1410 Cocaine abuse, uncomplicated: Secondary | ICD-10-CM | POA: Diagnosis not present

## 2020-10-24 DIAGNOSIS — Z5941 Food insecurity: Secondary | ICD-10-CM | POA: Insufficient documentation

## 2020-10-24 LAB — BASIC METABOLIC PANEL
Anion gap: 15 (ref 5–15)
BUN: 62 mg/dL — ABNORMAL HIGH (ref 6–20)
CO2: 29 mmol/L (ref 22–32)
Calcium: 8.6 mg/dL — ABNORMAL LOW (ref 8.9–10.3)
Chloride: 93 mmol/L — ABNORMAL LOW (ref 98–111)
Creatinine, Ser: 2.24 mg/dL — ABNORMAL HIGH (ref 0.61–1.24)
GFR, Estimated: 34 mL/min — ABNORMAL LOW (ref >60)
Glucose, Bld: 85 mg/dL (ref 70–99)
Potassium: 3.9 mmol/L (ref 3.5–5.1)
Sodium: 137 mmol/L (ref 135–145)

## 2020-10-24 LAB — DIGOXIN LEVEL: Digoxin Level: 0.7 ng/mL — ABNORMAL LOW (ref 1.0–2.0)

## 2020-10-24 NOTE — ED Triage Notes (Signed)
Patient states he is here for help getting off of crack cocaine. Patient states he wants to go to a treatment facility. Patient denies SI/HI and A/V/H.

## 2020-10-24 NOTE — Social Work (Signed)
CSW team met with pt during appointment. Pt still interested in seeking support for cessation from crack cocaine. He plans to go over to the [facility] today for a walk in assessment.   He has been provided with inpatient treatment options but has not yet called any of them- states his sister assists him with that and she plans to call. We encouraged him to start doing that as there are waitlists for all facilities we have spoken with. Assisted pt with getting on waitlist for [facility]- received verbal permission to send them requested clinicals. [facility] will need to provide medical cleared to add pt to wailist/accept. At this time pt states hesitation with outpatient supportive programs/counseling.  Pt states he is doing okay, he plans to stay with his sister for the time being for some stability. Assisted pt with getting to his [facility] appointment and provided bus passes as requested.    Westley Hummer, MSW, [place] Work

## 2020-10-24 NOTE — BH Assessment (Signed)
Franko Hilliker 54 y/o male presented to [facility] voluntarily looking for resources for substance abuse treatment. Patient was seen today at Heart Failure clinic and states that he was recommended to come to [facility] to get resources for substance abuse treatment. Patient was triage and does not have any SI/HI/AVH and only wants resource for SA treatment. TTS consulted with Letitia Libra, NP, who recommends giving patient resources to call for SA treatment. SW provided resources. TTS informed patient of [place] services and assessed for safety. Patient denies SI/HI/AVH and has plans to go to [facility] and call a family member to assist him. Patient states that he is on waiting list at several facilities, so he did not take resources. Patient states he arrived at [facility] via cab provided by clinic, and he requested a bus pass and bus pass was provided. TTS encouraged patient to return if he has thoughts of harming himself or anyone else.

## 2020-10-24 NOTE — Progress Notes (Signed)
Advanced Heart Failure Clinic Note   Referring Physician: PCP: [name], NP PCP-Cardiologist: Shelva Majestic, MD  [facility]: Dr. 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 Also w/ h/o poor compliance. Previous referred to paramedicine but not fully cooperative. Recently diagnosed w/ COVID 09/19/20.   Had recent admit earlier this month for a/c systolic heart failure w/ low out-put and AKI requiring milrinone to help w/ diuresis. He was cocaine positive. Diuresed and weaned off milrinone w/ stable co-ox at 65%. Scr had improved from 2.1>>1.7. D/c wt 159 lb. Unfortunately, his wife was also hospitalized for COVID and died while he was admitted.   He returns for post hospital f/u. Struggling since his wife died. Remer Macho was this past 2023-03-23. Admits to ongoing cocaine use. Reports he last used 2 days ago but looks high in clinic today. In transition of moving. Forgot to take his meds for the last 2 days but remembered to take his torsemide today. His wt is up 7 lb since his discharge. Denies significant dyspnea but notes abdominal fullness. Continues to smoke cigarettes 2-3/day. Denies ETOH. He is asking for assistance w/ rehab to help w/ drug detox.    Review of systems complete and found to be negative unless listed in HPI.      Past Medical History:  Diagnosis Date  . CAD in native artery 07/17/14   STEMI- LAD stenosis with DES  . CHF (congestive heart failure) ([place])   . Cocaine abuse ([place])   . COPD (chronic obstructive pulmonary disease) ([place])   . Dyspnea   . Hypertension   . ST elevation myocardial infarction (STEMI) involving left anterior descending (LAD)  coronary artery with complication ([place])   . Tobacco abuse 07/17/2014    Current Outpatient Medications  Medication Sig Dispense Refill  . albuterol (VENTOLIN HFA) 108 (90 Base) MCG/ACT inhaler Inhale 2 puffs into the lungs every 4 (four) hours as needed for wheezing or shortness of breath. 6.7 g 1  . aspirin 81 MG EC tablet Take 1 tablet (81 mg total) by mouth daily. 30 tablet 5  . atorvastatin (LIPITOR) 80 MG tablet Take 1 tablet (80 mg total) by mouth daily. 30 tablet 3  . digoxin (LANOXIN) 0.125 MG tablet Take 1 tablet (0.125 mg total) by mouth daily. 30 tablet 6  . doxycycline (VIBRA-TABS) 100 MG tablet Take 1 tablet (100 mg total) by mouth every 12 (twelve) hours. 14 tablet 0  . Multiple Vitamin (MULTIVITAMIN) tablet Take 1 tablet by mouth daily.     . pantoprazole (PROTONIX) 40 MG tablet Take 1 tablet (40 mg total) by mouth daily. 30 tablet 6  . potassium chloride SA (KLOR-CON) 20 MEQ tablet Take 1 tablet (20 mEq total) by mouth daily. 30 tablet 3  . torsemide (DEMADEX) 20 MG tablet Take 4 tablets (80 mg total) by mouth daily. 120 tablet 6   No current facility-administered medications for this encounter.    No Known Allergies    Social History   Socioeconomic History  . Marital status: Single    Spouse name: Not on file  . Number of children: 2  . Years of education: 24  . Highest education  level: 12th grade  Occupational History  . Occupation: Landscaping  Tobacco Use  . Smoking status: Current Every Day Smoker    Packs/day: 0.50    Years: 30.00    Pack years: 15.00    Types: Cigarettes  . Smokeless tobacco: Never Used  . Tobacco comment: .5 PK PER DAY  Vaping Use  . Vaping Use: Never used  Substance and Sexual Activity  . Alcohol use: No    Comment: Patient denies abuse, states social drinker  . Drug use: Yes    Types: Cocaine    Comment: heroin  . Sexual activity: Yes  Other Topics Concern  . Not on file  Social History Narrative   Lives in [place]  with his sister and girlfriend. He works Aeronautical engineer work.    Social Determinants of Health   Financial Resource Strain:   . Difficulty of Paying Living Expenses: Not on file  Food Insecurity: Food Insecurity Present  . Worried About Charity fundraiser in the Last Year: Sometimes true  . Ran Out of Food in the Last Year: Never true  Transportation Needs: No Transportation Needs  . Lack of Transportation (Medical): No  . Lack of Transportation (Non-Medical): No  Physical Activity:   . Days of Exercise per Week: Not on file  . Minutes of Exercise per Session: Not on file  Stress:   . Feeling of Stress : Not on file  Social Connections:   . Frequency of Communication with Friends and Family: Not on file  . Frequency of Social Gatherings with Friends and Family: Not on file  . Attends Religious Services: Not on file  . Active Member of Clubs or Organizations: Not on file  . Attends Archivist Meetings: Not on file  . Marital Status: Not on file  Intimate Partner Violence:   . Fear of Current or Ex-Partner: Not on file  . Emotionally Abused: Not on file  . Physically Abused: Not on file  . Sexually Abused: Not on file      Family History  Problem Relation Age of Onset  . Cirrhosis Father   . Heart attack Mother   . Heart attack Sister   . Heart failure Sister   . Heart attack Brother     Vitals:   10/24/20 1123  BP: 98/80  Pulse: (!) 107  SpO2: 97%  Weight: 75.3 kg (166 lb)     PHYSICAL EXAM: General:  Thin male appears to be high on drugs. No respiratory difficulty HEENT: normal  Neck: supple. JVD 10 cm. Carotids 2+ bilat; no bruits. No lymphadenopathy or thyromegaly appreciated. Cor: PMI nondisplaced. Regular rhythm, mildly tachy rate. No rubs, gallops. +3/6 MR murmur at apex Lungs: decreased BS at the bases bilaterally  Abdomen: soft, nontender, nondistended. No hepatosplenomegaly. No bruits or masses. Good bowel sounds. Extremities: no  cyanosis, clubbing, rash, edema Neuro: alert & oriented x 3, cranial nerves grossly intact. moves all 4 extremities w/o difficulty. Affect pleasant.  ECG: sinus tach 107 bpm    ASSESSMENT & PLAN:  1. Chronic Systolic HF: Echo in 5993 with EF 40-45%, thought to be ischemic cardiomyopathy. Cath in 10/19 showed 90% ostial OM1 and 70% PLV, no interventional target. Suspect mixed ischemic/nonischemic cardiomyopathy. Recent Echo 10/21 showed EF 20-25% with mild RV dysfunction.He has a strong family history of CHF/cardiomyopathy, so may be a component of familial cardiomyopathy.Cocaine likely plays a role in his cardiomyopathy.Multiple re-admits forcardiogenic shockin the last 6 months requiring milrinone. Unfortunately, not  a candidate for advanced therapies or home milrinone with active use of cocaine.He is also now homeless (SW following).  - He has end-stage HF.   - Given social situation and ongoing substance abuse he is not a candidate for any advanced therapies including home milrinone - will continue medical management, although limited by low BP and CKD  - Volume status is up, in setting of missed meds for the last 2 days. Increase torsemide to 100 mg daily x 2 days, then reduce back down to 80 mg daily.    - Continue digoxin. Check dig level  - No other HF meds given low BP and CKD.  - Check BMP today   2. CAD: H/o anterior STEMI with DES to LAD in 7/15. Cath in 10/19 with 90% ostial OM1 and 70% PLV, no interventional target.  - denies CP   -ContinueASA 81 and atorvastatin 80 mg daily.  - needs to refrain from using cocaine  3. Cocaine abuse: - UDS + recent admit 2023-11-15 - admits to continued use, last used ~2 days ago  - abstinence encouraged  - SW assisting to help w/ getting him to a rehab facility for detox   4. Active smoker:  - smoking cessation advised   5. Stage III CKD  - Baseline SCr ~1.6 - Check BMP today   6. Recent COVID Infection  - tested +  09/19/20 - no active symptoms  - unfortunately, his wife recently died from [place] 11-15-23    7. Mitral Regurgitation - moderate to severe on recent echo, suspect functional MR from dilated CM   F/u in 2 weeks w/ APP   Lyda Jester, PA-C 10/24/20

## 2020-10-24 NOTE — Patient Instructions (Signed)
INCREASE Torsemide to 100mg  (5 tabs) daily for 2 days only, then go back to normal daily dose.    Labs today We will only contact you if something comes back abnormal or we need to make some changes. Otherwise no news is good news!   Your physician recommends that you schedule a follow-up appointment in: 2 weeks with Nurse Practitioner/Physicians Assistant.    Please call office at [phone] option 2 if you have any questions or concerns.    At the [facility], you and your health needs are our priority. As part of our continuing mission to provide you with exceptional heart care, we have created designated Provider Care Teams. These Care Teams include your primary Cardiologist (physician) and Advanced Practice Providers (APPs- Physician Assistants and Nurse Practitioners) who all work together to provide you with the care you need, when you need it.   You may see any of the following providers on your designated Care Team at your next follow up: Marland Kitchen Dr Glori Bickers . Dr Loralie Champagne . Darrick Grinder, NP . Lyda Jester, PA . Audry Riles, PharmD   Please be sure to bring in all your medications bottles to every appointment.

## 2020-10-24 NOTE — Addendum Note (Signed)
Encounter addended by: Alexander Mt, LCSW on: 10/24/2020 1:37 PM  Actions taken: Pend clinical note

## 2020-10-24 NOTE — Telephone Encounter (Signed)
°   Tyler Wong DOB: August 25, 1966 MRN: [id number]   RIDER WAIVER AND RELEASE OF LIABILITY  For purposes of improving physical access to our facilities, [facility] is pleased to partner with third parties to provide [facility] patients or other authorized individuals the option of convenient, on-demand ground transportation services (the [employer]) through use of the technology service that enables users to request on-demand ground transportation from independent third-party providers.  By opting to use and accept these [employer], I, the undersigned, hereby agree on behalf of myself, and on behalf of any minor child using the [employer] for whom I am the parent or legal guardian, as follows:  1. Government social research officer provided to me are provided by independent third-party transportation providers who are not [employer] or employees and who are unaffiliated with [employer]. 2. [name] is neither a transportation carrier nor a common or public carrier. 3. [facility] has no control over the quality or safety of the transportation that occurs as a result of the [employer]. 4. [facility] cannot guarantee that any third-party transportation provider will complete any arranged transportation service. 5. [facility] makes no representation, warranty, or guarantee regarding the reliability, timeliness, quality, safety, suitability, or availability of any of the Transport Services or that they will be error free. 6. I fully understand that traveling by vehicle involves risks and dangers of serious bodily injury, including permanent disability, paralysis, and death. I agree, on behalf of myself and on behalf of any minor child using the Transport Services for whom I am the parent or legal guardian, that the entire risk arising out of my use of the [employer] remains solely with me, to the maximum extent permitted under applicable law. 7. The [employer]  are provided as is and as available. [name] disclaims all representations and warranties, express, implied or statutory, not expressly set out in these terms, including the implied warranties of merchantability and fitness for a particular purpose. 8. I hereby waive and release [name], its agents, employees, officers, directors, representatives, insurers, attorneys, assigns, successors, subsidiaries, and affiliates from any and all past, present, or future claims, demands, liabilities, actions, causes of action, or suits of any kind directly or indirectly arising from acceptance and use of the [employer]. 9. I further waive and release [name] and its affiliates from all present and future liability and responsibility for any injury or death to persons or damages to property caused by or related to the use of the [employer]. 10. I have read this Waiver and Release of Liability, and I understand the terms used in it and their legal significance. This Waiver is freely and voluntarily given with the understanding that my right (as well as the right of any minor child for whom I am the parent or legal guardian using the [employer]) to legal recourse against  in connection with the [employer] is knowingly surrendered in return for use of these services.   I attest that I read the consent document to Tyler Wong, gave Tyler Wong the opportunity to ask questions and answered the questions asked (if any). I affirm that Tyler Wong then provided consent for he's participation in this program.     [facility] Wong

## 2020-10-25 ENCOUNTER — Other Ambulatory Visit: Payer: Self-pay | Admitting: *Deleted

## 2020-10-25 ENCOUNTER — Telehealth (HOSPITAL_COMMUNITY): Payer: Self-pay | Admitting: Licensed Clinical Social Worker

## 2020-10-25 NOTE — Telephone Encounter (Signed)
Clinicals sent to [facility] for review to make sure pt is medically appropriate for their inpatient program- fax confirmation received- awaiting determination  [name], [facility] Desk#: [phone] Cell#: [phone]

## 2020-10-25 NOTE — Patient Outreach (Signed)
Care Coordination  10/25/2020  Tyler Wong 02-28-1966 [id number]   A second unsuccessful telephone outreach was attempted today. The patient was referred to the case management team for assistance with care management and care coordination.   Follow Up Plan: A HIPAA compliant phone message was left for the patient providing contact information and requesting a return call.  The Managed Medicaid care management team will reach out to the patient again over the next 7-14 days.   [place] RN, BSN   [employer] manager

## 2020-10-25 NOTE — Patient Instructions (Signed)
Visit Information  [name]  - as a part of your Medicaid benefit, you are eligible for care management and care coordination services at no cost or copay. I was unable to reach you by phone today but would be happy to help you with your health related needs. Please feel free to call me @ [phone].   A member of the Managed Medicaid care management team will reach out to you again over the next 7-14 days.   Lurena Joiner RN, BSN [place]  [employer] manager

## 2020-10-27 ENCOUNTER — Other Ambulatory Visit: Payer: Self-pay

## 2020-10-27 ENCOUNTER — Ambulatory Visit (HOSPITAL_COMMUNITY)
Admission: EM | Admit: 2020-10-27 | Discharge: 2020-10-27 | Disposition: A | Discharge status: Other Acute Inpatient Hospital | Payer: Medicaid Other

## 2020-10-27 ENCOUNTER — Emergency Department (HOSPITAL_COMMUNITY)
Admission: EM | Admit: 2020-10-27 | Discharge: 2020-10-28 | Disposition: A | Discharge status: Home / Self Care | Payer: Medicaid Other | Attending: Emergency Medicine | Admitting: Emergency Medicine

## 2020-10-27 ENCOUNTER — Encounter (HOSPITAL_COMMUNITY): Payer: Self-pay | Admitting: Emergency Medicine

## 2020-10-27 DIAGNOSIS — K5900 Constipation, unspecified: Secondary | ICD-10-CM | POA: Insufficient documentation

## 2020-10-27 DIAGNOSIS — I5021 Acute systolic (congestive) heart failure: Secondary | ICD-10-CM | POA: Diagnosis not present

## 2020-10-27 DIAGNOSIS — F1420 Cocaine dependence, uncomplicated: Secondary | ICD-10-CM | POA: Diagnosis not present

## 2020-10-27 DIAGNOSIS — F17210 Nicotine dependence, cigarettes, uncomplicated: Secondary | ICD-10-CM | POA: Diagnosis not present

## 2020-10-27 DIAGNOSIS — N183 Chronic kidney disease, stage 3 unspecified: Secondary | ICD-10-CM | POA: Insufficient documentation

## 2020-10-27 DIAGNOSIS — Z79899 Other long term (current) drug therapy: Secondary | ICD-10-CM | POA: Diagnosis not present

## 2020-10-27 DIAGNOSIS — Z955 Presence of coronary angioplasty implant and graft: Secondary | ICD-10-CM | POA: Diagnosis not present

## 2020-10-27 DIAGNOSIS — Z20822 Contact with and (suspected) exposure to covid-19: Secondary | ICD-10-CM | POA: Diagnosis not present

## 2020-10-27 DIAGNOSIS — J449 Chronic obstructive pulmonary disease, unspecified: Secondary | ICD-10-CM | POA: Insufficient documentation

## 2020-10-27 DIAGNOSIS — I2510 Atherosclerotic heart disease of native coronary artery without angina pectoris: Secondary | ICD-10-CM | POA: Insufficient documentation

## 2020-10-27 DIAGNOSIS — Z7982 Long term (current) use of aspirin: Secondary | ICD-10-CM | POA: Diagnosis not present

## 2020-10-27 DIAGNOSIS — R5381 Other malaise: Secondary | ICD-10-CM | POA: Diagnosis not present

## 2020-10-27 DIAGNOSIS — F1910 Other psychoactive substance abuse, uncomplicated: Secondary | ICD-10-CM

## 2020-10-27 DIAGNOSIS — F1410 Cocaine abuse, uncomplicated: Secondary | ICD-10-CM

## 2020-10-27 DIAGNOSIS — I130 Hypertensive heart and chronic kidney disease with heart failure and stage 1 through stage 4 chronic kidney disease, or unspecified chronic kidney disease: Secondary | ICD-10-CM | POA: Diagnosis not present

## 2020-10-27 LAB — COMPREHENSIVE METABOLIC PANEL
ALT: 36 U/L (ref 0–44)
AST: 35 U/L (ref 15–41)
Albumin: 3.4 g/dL — ABNORMAL LOW (ref 3.5–5.0)
Alkaline Phosphatase: 116 U/L (ref 38–126)
Anion gap: 14 (ref 5–15)
BUN: 45 mg/dL — ABNORMAL HIGH (ref 6–20)
CO2: 27 mmol/L (ref 22–32)
Calcium: 8.9 mg/dL (ref 8.9–10.3)
Chloride: 99 mmol/L (ref 98–111)
Creatinine, Ser: 1.8 mg/dL — ABNORMAL HIGH (ref 0.61–1.24)
GFR, Estimated: 44 mL/min — ABNORMAL LOW (ref >60)
Glucose, Bld: 158 mg/dL — ABNORMAL HIGH (ref 70–99)
Potassium: 3.6 mmol/L (ref 3.5–5.1)
Sodium: 140 mmol/L (ref 135–145)
Total Bilirubin: 0.8 mg/dL (ref 0.3–1.2)
Total Protein: 7.1 g/dL (ref 6.5–8.1)

## 2020-10-27 LAB — CBC WITH DIFFERENTIAL/PLATELET
Abs Immature Granulocytes: 0.06 10*3/uL (ref 0.00–0.07)
Basophils Absolute: 0.1 10*3/uL (ref 0.0–0.1)
Basophils Relative: 1 %
Eosinophils Absolute: 0.1 10*3/uL (ref 0.0–0.5)
Eosinophils Relative: 1 %
HCT: 38.7 % — ABNORMAL LOW (ref 39.0–52.0)
Hemoglobin: 12.8 g/dL — ABNORMAL LOW (ref 13.0–17.0)
Immature Granulocytes: 1 %
Lymphocytes Relative: 14 %
Lymphs Abs: 1.3 10*3/uL (ref 0.7–4.0)
MCH: 28.8 pg (ref 26.0–34.0)
MCHC: 33.1 g/dL (ref 30.0–36.0)
MCV: 87.2 fL (ref 80.0–100.0)
Monocytes Absolute: 1.1 10*3/uL — ABNORMAL HIGH (ref 0.1–1.0)
Monocytes Relative: 12 %
Neutro Abs: 6.5 10*3/uL (ref 1.7–7.7)
Neutrophils Relative %: 71 %
Platelets: 290 10*3/uL (ref 150–400)
RBC: 4.44 MIL/uL (ref 4.22–5.81)
RDW: 17.5 % — ABNORMAL HIGH (ref 11.5–15.5)
WBC: 9.2 10*3/uL (ref 4.0–10.5)
nRBC: 0 % (ref 0.0–0.2)

## 2020-10-27 LAB — RAPID URINE DRUG SCREEN, HOSP PERFORMED
Amphetamines: NOT DETECTED
Barbiturates: NOT DETECTED
Benzodiazepines: NOT DETECTED
Cocaine: POSITIVE — AB
Opiates: NOT DETECTED
Tetrahydrocannabinol: NOT DETECTED

## 2020-10-27 LAB — URINALYSIS, ROUTINE W REFLEX MICROSCOPIC
Bacteria, UA: NONE SEEN
Bilirubin Urine: NEGATIVE
Glucose, UA: >=500 mg/dL — AB
Hgb urine dipstick: NEGATIVE
Ketones, ur: NEGATIVE mg/dL
Leukocytes,Ua: NEGATIVE
Nitrite: NEGATIVE
Protein, ur: NEGATIVE mg/dL
Specific Gravity, Urine: 1.005 (ref 1.005–1.030)
pH: 7 (ref 5.0–8.0)

## 2020-10-27 LAB — ETHANOL: Alcohol, Ethyl (B): <10 mg/dL (ref <10)

## 2020-10-27 NOTE — ED Notes (Signed)
Annamary Carolin, Charge RN @ [facility] notified of impending transfer

## 2020-10-27 NOTE — BH Assessment (Signed)
Clinician met with pt and his two sisters with NP, Tanika Lewis, to review plan of action for pt due to his high blood pressure. Pt gave verbal consent for clinician and NP to speak to his sisters, and pt and his sisters expressed no concern regarding speaking in the lobby, as no other patients were in the lobby (only staff were present). Clinician shared she and NP had reviewed pt's chart and it appears that pt was referred to [facility] for SA Treatment, which pt and his sisters verified was the plan. Clinician stated pt's BP is currently high and of concern, so pt is going to be transferred to the hospital via EMS to ensure pt can be cleared medically. NP shared that clinician is the only one working at the [facility] tonight and that, if he is d/c to the [facility] following being medically cleared, she will be the person doing his BH Assessment. She continued that, if his assessment is ordered while he is still at the hospital, it will either be her or the clinician at [facility] conducting the assessment. Clinician continued that if pt is transferred to the [facility], he will remain there for up to 24 hours, but most likely be d/c prior to that; she shared there is also a chance pt will stay at the hospital overnight for observation. Pt and his sisters expressed an understanding.  Pt's sister requested to speak to clinician and NP in a separate area and we obliged. Pt's sister shared that one of the staff members working at the front desk knows a member of their family and she wants to ensure that none of this information is spread to anyone. Clinician ensured pt's sister that [facility] takes HIPAA very seriously and that there should be no acknowledgement by any staff member of [facility] at any time that any member of their family is a patient. Pt's sister expressed understanding and appreciation regarding this, though she inquired as to why pt's sisters expressed that they were fine discussing their brother's situation in the front  lobby where the front staff members could hear the conversation. Pt's sisters shared they are not concerned about what these staff members heard, they are concerned about them stating that they saw them there. Clinician and NP again reinforced that this will not be an issue and that, to relieve any concerns pt's sisters have, they will reinforce the importance of this with the front staff; pt's sisters expressed an appreciation regarding this. 

## 2020-10-27 NOTE — ED Provider Notes (Signed)
[facility] DEPT Provider Note   CSN: [id number] Arrival date & time: 10/27/20  1957     History Chief Complaint  Patient presents with  . Constipation    Tyler Wong is a 54 y.o. male.  The history is provided by the patient and medical records.  Constipation  Tyler Wong is a 54 y.o. male who presents to the Emergency Department complaining of medical clearance. Level V caveat due to intoxication. Patient reports that he is in the emergency department to be watched overnight and reassess by psychiatry in the morning. He denies any current complaints on assessment but is drowsy and difficult to awaken. On triage note there is a complaint of constipation. He denies any fevers, chest pain, difficulty breathing. He does use crack cocaine, last use was a few days ago. He denies any SI, HI. He is currently without a place to live.      Past Medical History:  Diagnosis Date  . CAD in native artery 07/17/14   STEMI- LAD stenosis with DES  . CHF (congestive heart failure) ([place])   . Cocaine abuse ([place])   . COPD (chronic obstructive pulmonary disease) ([place])   . Dyspnea   . Hypertension   . ST elevation myocardial infarction (STEMI) involving left anterior descending (LAD) coronary artery with complication ([place])   . Tobacco abuse 07/17/2014    Patient Active Problem List   Diagnosis Date Noted  . Palliative care by specialist   . Goals of care, counseling/discussion   . Acute systolic HF (heart failure) ([place]) 10/03/2020  . Acute on chronic systolic CHF (congestive heart failure) ([place]) 10/03/2020  . Acute renal failure superimposed on stage 3a chronic kidney disease ([place])   . Acute respiratory disease due to COVID-19 virus 09/20/2020  . Acute respiratory failure due to COVID-19 ([place]) 09/19/2020  . Cardiorenal syndrome 06/29/2020  . 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 Acute on chronic clinical systolic heart failure ([place])  06/22/2020  . Acute on chronic systolic (congestive) heart failure ([place]) 06/22/2020  . Palliative care encounter   . Cardiogenic shock ([place]) 04/11/2020  . CKD (chronic kidney disease) stage 3, GFR 30-59 ml/min (H[facility]) 04/11/2020  . Symptomatic anemia 04/09/2020  . Acute on chronic HFrEF (heart failure with reduced ejection fraction) ([place]) 05/05/2019  . HFrEF (heart failure with reduced ejection fraction) ([place]) 05/04/2019  . Acute combined systolic and diastolic congestive heart failure ([place]) 10/02/2018  . Hepatitis C 04/13/2017  . Current non-adherence to medical treatment 04/13/2017  . Acute on chronic combined systolic (congestive) and diastolic (congestive) heart failure ([place]) 04/13/2017  . HTN (hypertension) 04/12/2017  . Insomnia 01/18/2017  . Chronic combined systolic and diastolic congestive heart failure ([place]) 01/04/2017  . Hyperlipidemia 01/04/2017  . Chest pain 12/30/2016  . Coronary artery disease involving native heart 07/18/2014  . Tobacco abuse 07/17/2014  . Cocaine use 07/17/2014    Past Surgical History:  Procedure Laterality Date  . CORONARY ANGIOPLASTY WITH STENT PLACEMENT  07/18/14   resolute DES to LAD STEMI  . LEFT HEART CATH N/A 07/17/2014   Procedure: LEFT HEART CATH;  Surgeon: [name], MD;  Location: [facility] LAB;  Service: Cardiovascular;  Laterality: N/A;  . PERCUTANEOUS CORONARY STENT INTERVENTION (PCI-S)  07/17/2014   Procedure: PERCUTANEOUS CORONARY STENT INTERVENTION (PCI-S);  Surgeon: [name], MD;  Location: [facility] CATH LAB;  Service: Cardiovascular;;  . RIGHT/LEFT HEART CATH AND CORONARY ANGIOGRAPHY N/A 10/03/2018   Procedure: RIGHT/LEFT HEART CATH AND CORONARY  ANGIOGRAPHY;  Surgeon: [name], MD;  Location: [facility] CV LAB;  Service: Cardiovascular;  Laterality: N/A;       Family History  Problem Relation Age of Onset  . Cirrhosis Father   . Heart attack Mother   . Heart attack Sister   . Heart failure Sister   . Heart attack  Brother     Social History   Tobacco Use  . Smoking status: Current Every Day Smoker    Packs/day: 0.25    Years: 30.00    Pack years: 7.50    Types: Cigarettes  . Smokeless tobacco: Never Used  . Tobacco comment: .5 PK PER DAY  Vaping Use  . Vaping Use: Never used  Substance Use Topics  . Alcohol use: No    Comment: Patient denies abuse, states social drinker  . Drug use: Yes    Types: Cocaine, "Crack" cocaine    Comment: heroin    Home Medications Prior to Admission medications   Medication Sig Start Date End Date Taking? Authorizing Provider  albuterol (VENTOLIN HFA) 108 (90 Base) MCG/ACT inhaler Inhale 2 puffs into the lungs every 4 (four) hours as needed for wheezing or shortness of breath. 01/24/20  Yes [name], NP  aspirin 81 MG EC tablet Take 1 tablet (81 mg total) by mouth daily. 04/25/20  Yes Rosita Fire, Brittainy M, PA-C  atorvastatin (LIPITOR) 80 MG tablet Take 1 tablet (80 mg total) by mouth daily. 10/09/20  Yes Croitoru, Mihai, MD  digoxin (LANOXIN) 0.125 MG tablet Take 1 tablet (0.125 mg total) by mouth daily. 10/09/20  Yes Croitoru, Mihai, MD  empagliflozin (JARDIANCE) 10 MG TABS tablet Take 10 mg by mouth daily.   Yes [provider]  isosorbide-hydrALAZINE (BIDIL) 20-37.5 MG tablet Take 1 tablet by mouth 3 (three) times daily.   Yes [provider]  Multiple Vitamin (MULTIVITAMIN) tablet Take 1 tablet by mouth daily.    Yes [provider]  omeprazole (PRILOSEC) 20 MG capsule Take 20 mg by mouth every morning. 10/22/20  Yes [provider]  pantoprazole (PROTONIX) 40 MG tablet Take 1 tablet (40 mg total) by mouth daily. 10/09/20  Yes Clegg, Amy D, NP  potassium chloride SA (KLOR-CON) 20 MEQ tablet Take 1 tablet (20 mEq total) by mouth daily. 10/09/20  Yes Croitoru, Mihai, MD  torsemide (DEMADEX) 20 MG tablet Take 4 tablets (80 mg total) by mouth daily. 10/09/20  Yes Croitoru, Mihai, MD  doxycycline (VIBRA-TABS) 100 MG  tablet Take 1 tablet (100 mg total) by mouth every 12 (twelve) hours. Patient not taking: Reported on 10/27/2020 10/09/20   Conrad [place], NP    Allergies    Patient has no known allergies.  Review of Systems   Review of Systems  Gastrointestinal: Positive for constipation.  All other systems reviewed and are negative.   Physical Exam Updated Vital Signs BP 108/87   Pulse (!) 115   Temp 99.1 F (37.3 C) (Oral)   Resp 18   SpO2 98%   Physical Exam Vitals and nursing note reviewed.  Constitutional:      Appearance: He is well-developed.     Comments: Drowsy but awakens to verbal stimuli  HENT:     Head: Normocephalic and atraumatic.  Cardiovascular:     Rate and Rhythm: Regular rhythm. Tachycardia present.  Pulmonary:     Effort: Pulmonary effort is normal. No respiratory distress.  Abdominal:     Palpations: Abdomen is soft.     Tenderness:  There is no abdominal tenderness. There is no guarding or rebound.  Musculoskeletal:        General: No tenderness.  Skin:    General: Skin is warm and dry.  Neurological:     Mental Status: He is oriented to person, place, and time.  Psychiatric:        Behavior: Behavior normal.     ED Results / Procedures / Treatments   Labs (all labs ordered are listed, but only abnormal results are displayed) Labs Reviewed  COMPREHENSIVE METABOLIC PANEL - Abnormal; Notable for the following components:      Result Value   Glucose, Bld 158 (*)    BUN 45 (*)    Creatinine, Ser 1.80 (*)    Albumin 3.4 (*)    GFR, Estimated 44 (*)    All other components within normal limits  CBC WITH DIFFERENTIAL/PLATELET - Abnormal; Notable for the following components:   Hemoglobin 12.8 (*)    HCT 38.7 (*)    RDW 17.5 (*)    Monocytes Absolute 1.1 (*)    All other components within normal limits  RAPID URINE DRUG SCREEN, HOSP PERFORMED - Abnormal; Notable for the following components:   Cocaine POSITIVE (*)    All other components within normal  limits  ETHANOL  URINALYSIS, ROUTINE W REFLEX MICROSCOPIC    EKG EKG Interpretation  Date/Time:  Sunday October 27 2020 22:15:48 EDT Ventricular Rate:  114 PR Interval:    QRS Duration: 97 QT Interval:  349 QTC Calculation: 481 R Axis:   -82 Text Interpretation: Sinus tachycardia Biatrial enlargement Left ventricular hypertrophy Inferior infarct, old Anterior infarct, old Lateral leads are also involved Confirmed by Quintella Reichert [phone]) on 10/27/2020 10:29:15 PM   Radiology No results found.  Procedures Procedures (including critical care time)  Medications Ordered in ED Medications - No data to display  ED Course  I have reviewed the triage vital signs and the nursing notes.  Pertinent labs & imaging results that were available during my care of the patient were reviewed by me and considered in my medical decision making (see chart for details).    MDM Rules/Calculators/A&P                         patient with history of CHF and substance abuse referred to the emergency department from behavioral health. Patient is somnolent on evaluation and is unable to clearly state why he is in the emergency department. He is not currently suicidal or homicidal. He is tachycardic but in no acute distress. Suspect some degree of intoxication. Patient care transferred pending reassessment and labs.  Final Clinical Impression(s) / ED Diagnoses Final diagnoses:  None    Rx / DC Orders ED Discharge Orders    None       Quintella Reichert, MD 10/27/20 2300

## 2020-10-27 NOTE — ED Triage Notes (Signed)
Pt reports using cocaine. States that he has been constipated for 3 days.

## 2020-10-27 NOTE — ED Provider Notes (Signed)
Behavioral Health Urgent Care Medical Screening Exam  Patient Name: Tyler Wong MRN: [id number] Date of Evaluation: 10/27/20 Chief Complaint:   Diagnosis:  Final diagnoses:  None    History of Present illness: Tyler Wong is a 54 y.o. male presents to [facility] behavioral health center accompanied by his sisters.  Patient reports " I just need some help."  Reports worsening depression and substance abuse issues.  Denying suicidal or homicidal ideations.  Denies auditory or visual hallucinations.  Patient reports last using cocaine 2 hours prior to arrival.  Heart rate 120.  Chart review cardiac history.  Patient to be medically cleared before assessment.  Hadyn provided verbal authorization was to speak with his sisters regarding treatment plan.  Sisters report concerns that patient is not getting treatment that he needs.  Reports her recent passing of his significant other.  And is hopeful to stay in the loop regarding medical treatment.   Per assessment note on 10/25/2020: Patient was faxed out to a number of residential treatment facilities.  Psychiatric Specialty Exam  Presentation  General Appearance:Appropriate for Environment  Eye Contact:Minimal  Speech:Clear and Coherent  Speech Volume:Normal  Handedness:Right   Mood and Affect  Mood:Depressed;Anxious  Affect:Congruent   Thought Process  Thought Processes:Coherent  Descriptions of Associations:Intact  Orientation:Full (Time, Place and Person)  Thought Content:WDL  Hallucinations:None  Ideas of Reference:None  Suicidal Thoughts:No  Homicidal Thoughts:No   Sensorium  Memory:Immediate Good;Recent Good;Remote Fair  Judgment:Fair  Insight:Poor   Executive Functions  Concentration:Fair  Attention Span:Fair  Recall:No data recorded Fund of Knowledge:Good  Language:Good   Psychomotor Activity  Psychomotor Activity:Normal   Assets  Assets:Social Support   Sleep   Sleep:Fair  Number of hours: 5   Physical Exam: Physical Exam ROS Blood pressure 113/85, pulse (!) 120, temperature 98.2 F (36.8 C), resp. rate 18, height 5' 11.06" (1.805 m), weight 173 lb (78.5 kg), SpO2 99 %. Body mass index is 24.09 kg/m.  Musculoskeletal: Strength & Muscle Tone: within normal limits Gait & Station: normal Patient leans: N/A   [facility] MSE Discharge Disposition for Follow up and Recommendations: Based on my evaluation the patient appears to have an emergency medical condition for which I recommend the patient be transferred to the emergency department for further evaluation.    Derrill Center, NP 10/27/2020, 7:35 PM

## 2020-10-27 NOTE — ED Triage Notes (Signed)
Pt assessed by NP in lobby. Due to high heart rate and endorsed recent drug use EMS called for transfer to ED to medically clear patient.

## 2020-10-27 NOTE — ED Notes (Signed)
Patient has a pulse of 120

## 2020-10-28 ENCOUNTER — Encounter (HOSPITAL_COMMUNITY): Payer: Medicaid Other | Admitting: Cardiology

## 2020-10-28 ENCOUNTER — Telehealth (HOSPITAL_COMMUNITY): Payer: Self-pay | Admitting: Licensed Clinical Social Worker

## 2020-10-28 ENCOUNTER — Telehealth (HOSPITAL_COMMUNITY): Payer: Self-pay | Admitting: Obstetrics and Gynecology

## 2020-10-28 LAB — RESPIRATORY PANEL BY RT PCR (FLU A&B, COVID)
Influenza A by PCR: NEGATIVE
Influenza B by PCR: NEGATIVE
SARS Coronavirus 2 by RT PCR: NEGATIVE

## 2020-10-28 NOTE — Telephone Encounter (Signed)
CSW sister called to discuss pt rehab options.  CSW first obtained verbal permission to discuss from pt.  Pt has been denied from [facility] inpatient due to medical concerns.  CSW and pt called [facility] in [place] and placed pt on waitlist- pt will need to call every morning to check on waitlist status- pt aware  CSW and pt also called [facility] but they are unable to accept medicaid for inpatient services.  No other local inpatient options.  Pt interested in outpatient options- CSW making calls and will plan to help pt and sisters follow up.  Will continue to follow and assist as needed  [name], [facility] Desk#: [phone] Cell#: [phone]

## 2020-10-28 NOTE — ED Notes (Signed)
avs printed but pt left before d/c discussion

## 2020-10-28 NOTE — Discharge Instructions (Signed)
Please follow-up with the outpatient treatment centers for your cocaine use.

## 2020-10-28 NOTE — Telephone Encounter (Signed)
Patient called requesting COVID 19 test results. Patient gave permission for RN to access patient's chart to see COVID Test results.  Patient made aware that test results were negative for COVID and Influenza A and B

## 2020-10-28 NOTE — ED Provider Notes (Addendum)
Patient was reassessed this morning. He was sent from the behavioral health center because of concerns of possible acute medical process associated with his substance abuse. Denied any suicidal or homicidal ideation.  The patient's ED work-up was reassuring. Patient's vital signs are normal this morning. Patient is not tachycardic or hypotensive. Patient was positive for cocaine. Suspect this was risk contributing to his tachycardia yesterday. Patient denies SI or HI he is stable for discharge.     Dorie Rank, MD 10/28/20 0825   Attempted to contact to clarify if [facility] did want to see him again. Phone not answered.  Left message with [facility] day extender.  Pt ended up leaving prior to them calling back    Dorie Rank, MD 10/28/20 [phone]

## 2020-10-29 ENCOUNTER — Telehealth: Payer: Self-pay

## 2020-10-29 ENCOUNTER — Telehealth (HOSPITAL_COMMUNITY): Payer: Self-pay | Admitting: Licensed Clinical Social Worker

## 2020-10-29 NOTE — Telephone Encounter (Signed)
CSW called pt at sister Angela's number.  Able to speak with pt and pt sister regarding next steps in obtaining rehab.  CSW reiterated that only local inpatient option that accepts Medicaid and hasn't medically denied him is [facility]- pt is on waitlist but will need to call daily- pt has phone number and will call this morning.  CSW discussed pt pursuing outpatient options while he tries awaits in patient placement.  Pt agreeable to pursing- provided with number for [facility] which provides individual, group, or intensive outpatient depending on patient need.  Will continue to follow and assist as needed  [name], [facility] Desk#: [phone] Cell#: [phone]

## 2020-10-29 NOTE — Telephone Encounter (Signed)
Transition Care Management Unsuccessful Follow-up Telephone Call  Date of discharge and from where:  [facility] 10/28/2020  Attempts:  1st Attempt  Reason for unsuccessful TCM follow-up call:  Left voice message

## 2020-10-31 ENCOUNTER — Inpatient Hospital Stay (HOSPITAL_COMMUNITY)
Admission: EM | Admit: 2020-10-31 | Discharge: 2020-11-06 | DRG: 291 | Disposition: A | Discharge status: Home / Self Care | Payer: Medicaid Other | Attending: Internal Medicine | Admitting: Internal Medicine

## 2020-10-31 ENCOUNTER — Other Ambulatory Visit: Payer: Self-pay

## 2020-10-31 ENCOUNTER — Encounter (HOSPITAL_COMMUNITY): Payer: Self-pay | Admitting: Emergency Medicine

## 2020-10-31 ENCOUNTER — Emergency Department (HOSPITAL_COMMUNITY): Payer: Medicaid Other

## 2020-10-31 DIAGNOSIS — Z955 Presence of coronary angioplasty implant and graft: Secondary | ICD-10-CM

## 2020-10-31 DIAGNOSIS — Z8616 Personal history of COVID-19: Secondary | ICD-10-CM

## 2020-10-31 DIAGNOSIS — Z8249 Family history of ischemic heart disease and other diseases of the circulatory system: Secondary | ICD-10-CM

## 2020-10-31 DIAGNOSIS — R072 Precordial pain: Secondary | ICD-10-CM | POA: Diagnosis not present

## 2020-10-31 DIAGNOSIS — I130 Hypertensive heart and chronic kidney disease with heart failure and stage 1 through stage 4 chronic kidney disease, or unspecified chronic kidney disease: Principal | ICD-10-CM | POA: Diagnosis present

## 2020-10-31 DIAGNOSIS — R1084 Generalized abdominal pain: Secondary | ICD-10-CM | POA: Diagnosis present

## 2020-10-31 DIAGNOSIS — Z7982 Long term (current) use of aspirin: Secondary | ICD-10-CM

## 2020-10-31 DIAGNOSIS — I2510 Atherosclerotic heart disease of native coronary artery without angina pectoris: Secondary | ICD-10-CM | POA: Diagnosis present

## 2020-10-31 DIAGNOSIS — Z8711 Personal history of peptic ulcer disease: Secondary | ICD-10-CM

## 2020-10-31 DIAGNOSIS — I420 Dilated cardiomyopathy: Secondary | ICD-10-CM | POA: Diagnosis present

## 2020-10-31 DIAGNOSIS — Z9861 Coronary angioplasty status: Secondary | ICD-10-CM

## 2020-10-31 DIAGNOSIS — Z5900 Homelessness unspecified: Secondary | ICD-10-CM

## 2020-10-31 DIAGNOSIS — N179 Acute kidney failure, unspecified: Secondary | ICD-10-CM | POA: Diagnosis present

## 2020-10-31 DIAGNOSIS — N1831 Chronic kidney disease, stage 3a: Secondary | ICD-10-CM | POA: Diagnosis present

## 2020-10-31 DIAGNOSIS — R079 Chest pain, unspecified: Secondary | ICD-10-CM | POA: Diagnosis not present

## 2020-10-31 DIAGNOSIS — F1490 Cocaine use, unspecified, uncomplicated: Secondary | ICD-10-CM | POA: Diagnosis present

## 2020-10-31 DIAGNOSIS — I340 Nonrheumatic mitral (valve) insufficiency: Secondary | ICD-10-CM | POA: Diagnosis present

## 2020-10-31 DIAGNOSIS — R1013 Epigastric pain: Secondary | ICD-10-CM

## 2020-10-31 DIAGNOSIS — K229 Disease of esophagus, unspecified: Secondary | ICD-10-CM | POA: Diagnosis present

## 2020-10-31 DIAGNOSIS — I5043 Acute on chronic combined systolic (congestive) and diastolic (congestive) heart failure: Secondary | ICD-10-CM

## 2020-10-31 DIAGNOSIS — C163 Malignant neoplasm of pyloric antrum: Secondary | ICD-10-CM | POA: Diagnosis present

## 2020-10-31 DIAGNOSIS — I429 Cardiomyopathy, unspecified: Secondary | ICD-10-CM | POA: Diagnosis not present

## 2020-10-31 DIAGNOSIS — I252 Old myocardial infarction: Secondary | ICD-10-CM

## 2020-10-31 DIAGNOSIS — E785 Hyperlipidemia, unspecified: Secondary | ICD-10-CM

## 2020-10-31 DIAGNOSIS — I5023 Acute on chronic systolic (congestive) heart failure: Secondary | ICD-10-CM | POA: Diagnosis present

## 2020-10-31 DIAGNOSIS — F1410 Cocaine abuse, uncomplicated: Secondary | ICD-10-CM | POA: Diagnosis present

## 2020-10-31 DIAGNOSIS — I509 Heart failure, unspecified: Secondary | ICD-10-CM

## 2020-10-31 DIAGNOSIS — R935 Abnormal findings on diagnostic imaging of other abdominal regions, including retroperitoneum: Secondary | ICD-10-CM

## 2020-10-31 DIAGNOSIS — J449 Chronic obstructive pulmonary disease, unspecified: Secondary | ICD-10-CM | POA: Diagnosis present

## 2020-10-31 DIAGNOSIS — I110 Hypertensive heart disease with heart failure: Secondary | ICD-10-CM | POA: Diagnosis not present

## 2020-10-31 DIAGNOSIS — I43 Cardiomyopathy in diseases classified elsewhere: Secondary | ICD-10-CM

## 2020-10-31 DIAGNOSIS — K2970 Gastritis, unspecified, without bleeding: Secondary | ICD-10-CM | POA: Diagnosis present

## 2020-10-31 DIAGNOSIS — K259 Gastric ulcer, unspecified as acute or chronic, without hemorrhage or perforation: Secondary | ICD-10-CM | POA: Diagnosis present

## 2020-10-31 DIAGNOSIS — Z7984 Long term (current) use of oral hypoglycemic drugs: Secondary | ICD-10-CM

## 2020-10-31 DIAGNOSIS — K219 Gastro-esophageal reflux disease without esophagitis: Secondary | ICD-10-CM | POA: Diagnosis present

## 2020-10-31 DIAGNOSIS — Z79899 Other long term (current) drug therapy: Secondary | ICD-10-CM

## 2020-10-31 DIAGNOSIS — I255 Ischemic cardiomyopathy: Secondary | ICD-10-CM | POA: Diagnosis present

## 2020-10-31 DIAGNOSIS — K2289 Other specified disease of esophagus: Secondary | ICD-10-CM | POA: Diagnosis present

## 2020-10-31 DIAGNOSIS — R0602 Shortness of breath: Secondary | ICD-10-CM | POA: Diagnosis not present

## 2020-10-31 DIAGNOSIS — F17210 Nicotine dependence, cigarettes, uncomplicated: Secondary | ICD-10-CM | POA: Diagnosis present

## 2020-10-31 DIAGNOSIS — I5084 End stage heart failure: Secondary | ICD-10-CM | POA: Diagnosis present

## 2020-10-31 HISTORY — DX: Gastro-esophageal reflux disease without esophagitis: K21.9

## 2020-10-31 LAB — DIGOXIN LEVEL: Digoxin Level: 0.2 ng/mL — ABNORMAL LOW (ref 1.0–2.0)

## 2020-10-31 LAB — BASIC METABOLIC PANEL
Anion gap: 14 (ref 5–15)
BUN: 31 mg/dL — ABNORMAL HIGH (ref 6–20)
CO2: 29 mmol/L (ref 22–32)
Calcium: 9.2 mg/dL (ref 8.9–10.3)
Chloride: 94 mmol/L — ABNORMAL LOW (ref 98–111)
Creatinine, Ser: 1.64 mg/dL — ABNORMAL HIGH (ref 0.61–1.24)
GFR, Estimated: 49 mL/min — ABNORMAL LOW (ref >60)
Glucose, Bld: 123 mg/dL — ABNORMAL HIGH (ref 70–99)
Potassium: 3.5 mmol/L (ref 3.5–5.1)
Sodium: 137 mmol/L (ref 135–145)

## 2020-10-31 LAB — LIPASE, BLOOD: Lipase: 43 U/L (ref 11–51)

## 2020-10-31 LAB — HEPATIC FUNCTION PANEL
ALT: 32 U/L (ref 0–44)
AST: 31 U/L (ref 15–41)
Albumin: 3.3 g/dL — ABNORMAL LOW (ref 3.5–5.0)
Alkaline Phosphatase: 113 U/L (ref 38–126)
Bilirubin, Direct: 0.4 mg/dL — ABNORMAL HIGH (ref 0.0–0.2)
Indirect Bilirubin: 1.1 mg/dL — ABNORMAL HIGH (ref 0.3–0.9)
Total Bilirubin: 1.5 mg/dL — ABNORMAL HIGH (ref 0.3–1.2)
Total Protein: 7.2 g/dL (ref 6.5–8.1)

## 2020-10-31 LAB — CBC
HCT: 45.6 % (ref 39.0–52.0)
Hemoglobin: 14.3 g/dL (ref 13.0–17.0)
MCH: 28.7 pg (ref 26.0–34.0)
MCHC: 31.4 g/dL (ref 30.0–36.0)
MCV: 91.6 fL (ref 80.0–100.0)
Platelets: 235 10*3/uL (ref 150–400)
RBC: 4.98 MIL/uL (ref 4.22–5.81)
RDW: 17.7 % — ABNORMAL HIGH (ref 11.5–15.5)
WBC: 7.7 10*3/uL (ref 4.0–10.5)
nRBC: 0 % (ref 0.0–0.2)

## 2020-10-31 LAB — TROPONIN I (HIGH SENSITIVITY)
Troponin I (High Sensitivity): 227 ng/L (ref <18)
Troponin I (High Sensitivity): 190 ng/L (ref <18)

## 2020-10-31 LAB — RAPID URINE DRUG SCREEN, HOSP PERFORMED
Amphetamines: NOT DETECTED
Barbiturates: NOT DETECTED
Benzodiazepines: NOT DETECTED
Cocaine: POSITIVE — AB
Opiates: NOT DETECTED
Tetrahydrocannabinol: NOT DETECTED

## 2020-10-31 LAB — BRAIN NATRIURETIC PEPTIDE: B Natriuretic Peptide: 1322.2 pg/mL — ABNORMAL HIGH (ref 0.0–100.0)

## 2020-10-31 MED ORDER — LORAZEPAM 1 MG PO TABS
0.5000 mg | ORAL_TABLET | Freq: Two times a day (BID) | ORAL | Status: AC
  Administered 2020-10-31 – 2020-11-01 (×2): 0.5 mg via ORAL
  Filled 2020-10-31 (×2): qty 1

## 2020-10-31 MED ORDER — SODIUM CHLORIDE 0.9% FLUSH
3.0000 mL | Freq: Two times a day (BID) | INTRAVENOUS | Status: DC
  Administered 2020-10-31 – 2020-11-06 (×10): 3 mL via INTRAVENOUS

## 2020-10-31 MED ORDER — DIGOXIN 125 MCG PO TABS
0.1250 mg | ORAL_TABLET | Freq: Every day | ORAL | Status: DC
  Administered 2020-10-31 – 2020-11-05 (×6): 0.125 mg via ORAL
  Filled 2020-10-31 (×6): qty 1

## 2020-10-31 MED ORDER — EMPAGLIFLOZIN 10 MG PO TABS
10.0000 mg | ORAL_TABLET | Freq: Every day | ORAL | Status: DC
  Administered 2020-11-01 – 2020-11-05 (×5): 10 mg via ORAL
  Filled 2020-10-31 (×7): qty 1

## 2020-10-31 MED ORDER — ENOXAPARIN SODIUM 40 MG/0.4ML ~~LOC~~ SOLN
40.0000 mg | SUBCUTANEOUS | Status: DC
  Administered 2020-10-31 – 2020-11-05 (×6): 40 mg via SUBCUTANEOUS
  Filled 2020-10-31 (×6): qty 0.4

## 2020-10-31 MED ORDER — ATORVASTATIN CALCIUM 80 MG PO TABS
80.0000 mg | ORAL_TABLET | Freq: Every day | ORAL | Status: DC
  Administered 2020-10-31 – 2020-11-06 (×7): 80 mg via ORAL
  Filled 2020-10-31 (×7): qty 1

## 2020-10-31 MED ORDER — ADULT MULTIVITAMIN W/MINERALS CH
1.0000 | ORAL_TABLET | Freq: Every day | ORAL | Status: DC
  Administered 2020-10-31 – 2020-11-06 (×7): 1 via ORAL
  Filled 2020-10-31 (×7): qty 1

## 2020-10-31 MED ORDER — PANTOPRAZOLE SODIUM 40 MG PO TBEC: 40.0000 mg | DELAYED_RELEASE_TABLET | Freq: Every day | ORAL | Status: DC

## 2020-10-31 MED ORDER — SODIUM CHLORIDE 0.9 % IV SOLN: 250.0000 mL | INTRAVENOUS | Status: DC | PRN

## 2020-10-31 MED ORDER — ISOSORB DINITRATE-HYDRALAZINE 20-37.5 MG PO TABS
1.0000 | ORAL_TABLET | Freq: Three times a day (TID) | ORAL | Status: DC
  Administered 2020-10-31 – 2020-11-06 (×17): 1 via ORAL
  Filled 2020-10-31 (×17): qty 1

## 2020-10-31 MED ORDER — ACETAMINOPHEN 325 MG PO TABS: 650.0000 mg | ORAL_TABLET | ORAL | Status: DC | PRN

## 2020-10-31 MED ORDER — POTASSIUM CHLORIDE CRYS ER 20 MEQ PO TBCR
20.0000 meq | EXTENDED_RELEASE_TABLET | Freq: Every day | ORAL | Status: DC
  Administered 2020-10-31 – 2020-11-05 (×6): 20 meq via ORAL
  Filled 2020-10-31 (×6): qty 1

## 2020-10-31 MED ORDER — FUROSEMIDE 10 MG/ML IJ SOLN
80.0000 mg | Freq: Once | INTRAMUSCULAR | Status: AC
  Administered 2020-10-31: 80 mg via INTRAVENOUS
  Filled 2020-10-31: qty 8

## 2020-10-31 MED ORDER — SODIUM CHLORIDE 0.9% FLUSH: 3.0000 mL | INTRAVENOUS | Status: DC | PRN

## 2020-10-31 MED ORDER — IPRATROPIUM BROMIDE 0.02 % IN SOLN
0.5000 mg | RESPIRATORY_TRACT | Status: DC | PRN
  Administered 2020-11-01: 0.5 mg via RESPIRATORY_TRACT
  Filled 2020-10-31: qty 2.5

## 2020-10-31 MED ORDER — TORSEMIDE 20 MG PO TABS
80.0000 mg | ORAL_TABLET | Freq: Two times a day (BID) | ORAL | Status: DC
  Administered 2020-11-01: 80 mg via ORAL
  Filled 2020-10-31: qty 4

## 2020-10-31 MED ORDER — ASPIRIN EC 81 MG PO TBEC
81.0000 mg | DELAYED_RELEASE_TABLET | Freq: Every day | ORAL | Status: DC
  Administered 2020-10-31 – 2020-11-06 (×7): 81 mg via ORAL
  Filled 2020-10-31 (×7): qty 1

## 2020-10-31 MED ORDER — ONDANSETRON HCL 4 MG/2ML IJ SOLN: 4.0000 mg | Freq: Four times a day (QID) | INTRAMUSCULAR | Status: DC | PRN

## 2020-10-31 MED ORDER — PANTOPRAZOLE SODIUM 40 MG PO TBEC
40.0000 mg | DELAYED_RELEASE_TABLET | Freq: Every day | ORAL | Status: DC
  Administered 2020-10-31 – 2020-11-04 (×5): 40 mg via ORAL
  Filled 2020-10-31 (×5): qty 1

## 2020-10-31 NOTE — ED Triage Notes (Signed)
Pt reports intermittent chest burning since last night and SOB. States he takes torsemide and doubled up last night but had only peed twice since then.

## 2020-10-31 NOTE — ED Notes (Signed)
Pt resting in bed. NADN 

## 2020-10-31 NOTE — H&P (Signed)
History and Physical    Tyler Wong TWS:[id number] DOB: 1966/05/24 DOA: 10/31/2020  PCP: [name], NP (Confirm with patient/family/NH records and if not entered, this has to be entered at [facility] point of entry) Patient coming from: Home  I have personally briefly reviewed patient's old medical records in [place]  Chief Complaint: my belly bloated, fluid retention  HPI: Tyler Wong is a 54 y.o. male with medical history significant of advanced chronic systolic CHF with LVEF 20 to 25%, cocaine abuse, CKD stage II, presented with worsening of fluid retention. Patient been taking 80 mg torsemide twice daily for chronic systolic CHF. On and off, he noticed fluid retention around his ankles, and " feeling congested in my stomach, very easy to get food after eating any food", as educated by CHF team, has been taking extra dose of torsemide but he started to feel congestion and usually prevent fluid retention to become worse. However in the last 2 days, despite increasing his torsemide from 80 mg twice daily to 100 mg twice daily, I do not see increased urine output and meantime he started to feel increasing shortness of breath and again "bloated stomach". He claimed that he still use cocaine and the last time he used it was more than 2 weeks ago. And he also claims he has been compliant with fluid intake of less than 2000 mL/day and watch sodium intake. He denied any chest pain, no cough, no diarrhea, no fever or chills. ED Course: X-ray shows cardiomegaly but no significant pulmonary congestion, troponin 220>190.  Review of Systems: As per HPI otherwise 14 point review of systems negative.    Past Medical History:  Diagnosis Date  . CAD in native artery 07/17/14   STEMI- LAD stenosis with DES  . CHF (congestive heart failure) ([place])   . Cocaine abuse ([place])   . COPD (chronic obstructive pulmonary disease) ([place])   . Dyspnea   . Hypertension   . ST elevation myocardial infarction  (STEMI) involving left anterior descending (LAD) coronary artery with complication ([place])   . Tobacco abuse 07/17/2014    Past Surgical History:  Procedure Laterality Date  . CORONARY ANGIOPLASTY WITH STENT PLACEMENT  07/18/14   resolute DES to LAD STEMI  . LEFT HEART CATH N/A 07/17/2014   Procedure: LEFT HEART CATH;  Surgeon: [name], MD;  Location: [facility] CATH LAB;  Service: Cardiovascular;  Laterality: N/A;  . PERCUTANEOUS CORONARY STENT INTERVENTION (PCI-S)  07/17/2014   Procedure: PERCUTANEOUS CORONARY STENT INTERVENTION (PCI-S);  Surgeon: [name], MD;  Location: [facility] CATH LAB;  Service: Cardiovascular;;  . RIGHT/LEFT HEART CATH AND CORONARY ANGIOGRAPHY N/A 10/03/2018   Procedure: RIGHT/LEFT HEART CATH AND CORONARY ANGIOGRAPHY;  Surgeon: [name], MD;  Location: [facility] CV LAB;  Service: Cardiovascular;  Laterality: N/A;     reports that he has been smoking cigarettes. He has a 7.50 pack-year smoking history. He has never used smokeless tobacco. He reports current drug use. Drugs: Cocaine and "Crack" cocaine. He reports that he does not drink alcohol.  No Known Allergies  Family History  Problem Relation Age of Onset  . Cirrhosis Father   . Heart attack Mother   . Heart attack Sister   . Heart failure Sister   . Heart attack Brother      Prior to Admission medications   Medication Sig Start Date End Date Taking? Authorizing Provider  albuterol (VENTOLIN HFA) 108 (90 Base) MCG/ACT inhaler Inhale 2 puffs into the  lungs every 4 (four) hours as needed for wheezing or shortness of breath. 01/24/20   [name], NP  aspirin 81 MG EC tablet Take 1 tablet (81 mg total) by mouth daily. 04/25/20   Lyda Jester M, PA-C  atorvastatin (LIPITOR) 80 MG tablet Take 1 tablet (80 mg total) by mouth daily. 10/09/20   Croitoru, Mihai, MD  digoxin (LANOXIN) 0.125 MG tablet Take 1 tablet (0.125 mg total) by mouth daily. 10/09/20   Croitoru, Mihai, MD  doxycycline  (VIBRA-TABS) 100 MG tablet Take 1 tablet (100 mg total) by mouth every 12 (twelve) hours. Patient not taking: Reported on 10/27/2020 10/09/20   Darrick Grinder D, NP  empagliflozin (JARDIANCE) 10 MG TABS tablet Take 10 mg by mouth daily.    [provider]  isosorbide-hydrALAZINE (BIDIL) 20-37.5 MG tablet Take 1 tablet by mouth 3 (three) times daily.    [provider]  Multiple Vitamin (MULTIVITAMIN) tablet Take 1 tablet by mouth daily.     [provider]  omeprazole (PRILOSEC) 20 MG capsule Take 20 mg by mouth every morning. 10/22/20   [provider]  pantoprazole (PROTONIX) 40 MG tablet Take 1 tablet (40 mg total) by mouth daily. 10/09/20   Clegg, Amy D, NP  potassium chloride SA (KLOR-CON) 20 MEQ tablet Take 1 tablet (20 mEq total) by mouth daily. 10/09/20   Croitoru, Mihai, MD  torsemide (DEMADEX) 20 MG tablet Take 4 tablets (80 mg total) by mouth daily. 10/09/20   Croitoru, Dani Gobble, MD    Physical Exam: Vitals:   10/31/20 1730 10/31/20 1745 10/31/20 1815 10/31/20 1930  BP: 91/80 (!) 104/91 99/85 101/69  Pulse: (!) 109 (!) 111 (!) 108 (!) 105  Resp: 18 20    Temp:      TempSrc:      SpO2: 99% 98% 100% 100%  Weight:      Height:        Constitutional: NAD, calm, comfortable Vitals:   10/31/20 1730 10/31/20 1745 10/31/20 1815 10/31/20 1930  BP: 91/80 (!) 104/91 99/85 101/69  Pulse: (!) 109 (!) 111 (!) 108 (!) 105  Resp: 18 20    Temp:      TempSrc:      SpO2: 99% 98% 100% 100%  Weight:      Height:       Eyes: PERRL, lids and conjunctivae normal ENMT: Mucous membranes are moist. Posterior pharynx clear of any exudate or lesions.Normal dentition.  Neck: normal, supple, no masses, no thyromegaly Respiratory: clear to auscultation bilaterally, no wheezing, no crackles. Normal respiratory effort. No accessory muscle use.  Cardiovascular: Regular rate and rhythm, no murmurs / rubs / gallops. No extremity edema. 2+ pedal pulses. No carotid bruits.    Abdomen: no tenderness, no masses palpated. No hepatosplenomegaly. Bowel sounds positive.  Musculoskeletal: no clubbing / cyanosis. No joint deformity upper and lower extremities. Good ROM, no contractures. Normal muscle tone.  Skin: no rashes, lesions, ulcers. No induration Neurologic: CN 2-12 grossly intact. Sensation intact, DTR normal. Strength 5/5 in all 4.  Psychiatric: Normal judgment and insight. Alert and oriented x 3. Normal mood.     Labs on Admission: I have personally reviewed following labs and imaging studies  CBC: Recent Labs  Lab 10/27/20 2221 10/31/20 1451  WBC 9.2 7.7  NEUTROABS 6.5  --   HGB 12.8* 14.3  HCT 38.7* 45.6  MCV 87.2 91.6  PLT 290 664   Basic Metabolic Panel: Recent Labs  Lab 10/27/20 2221 10/31/20 1451  NA 140 137  K 3.6 3.5  CL 99 94*  CO2 27 29  GLUCOSE 158* 123*  BUN 45* 31*  CREATININE 1.80* 1.64*  CALCIUM 8.9 9.2   GFR: Estimated Creatinine Clearance: 54 mL/min (A) (by C-G formula based on SCr of 1.64 mg/dL (H)). Liver Function Tests: Recent Labs  Lab 10/27/20 2221 10/31/20 1604  AST 35 31  ALT 36 32  ALKPHOS 116 113  BILITOT 0.8 1.5*  PROT 7.1 7.2  ALBUMIN 3.4* 3.3*   Recent Labs  Lab 10/31/20 1604  LIPASE 43   No results for input(s): AMMONIA in the last 168 hours. Coagulation Profile: No results for input(s): INR, PROTIME in the last 168 hours. Cardiac Enzymes: No results for input(s): CKTOTAL, CKMB, CKMBINDEX, TROPONINI in the last 168 hours. BNP (last 3 results) Recent Labs    01/30/20 1645  PROBNP 1,759*   HbA1C: No results for input(s): HGBA1C in the last 72 hours. CBG: No results for input(s): GLUCAP in the last 168 hours. Lipid Profile: No results for input(s): CHOL, HDL, LDLCALC, TRIG, CHOLHDL, LDLDIRECT in the last 72 hours. Thyroid Function Tests: No results for input(s): TSH, T4TOTAL, FREET4, T3FREE, THYROIDAB in the last 72 hours. Anemia Panel: No results for input(s): VITAMINB12, FOLATE,  FERRITIN, TIBC, IRON, RETICCTPCT in the last 72 hours. Urine analysis:    Component Value Date/Time   COLORURINE STRAW (A) 10/27/2020 2221   APPEARANCEUR CLEAR 10/27/2020 2221   LABSPEC 1.005 10/27/2020 2221   PHURINE 7.0 10/27/2020 2221   GLUCOSEU >=500 (A) 10/27/2020 2221   HGBUR NEGATIVE 10/27/2020 [place] 10/27/2020 [place] 10/27/2020 2221   PROTEINUR NEGATIVE 10/27/2020 2221   NITRITE NEGATIVE 10/27/2020 2221   LEUKOCYTESUR NEGATIVE 10/27/2020 2221    Radiological Exams on Admission: DG Chest 2 View  Result Date: 10/31/2020 CLINICAL DATA:  Chest pain, shortness of breath EXAM: CHEST - 2 VIEW COMPARISON:  Chest radiograph dated 10/08/2020. FINDINGS: The heart is enlarged. The lungs are clear and there is no pleural effusion or pneumothorax. The osseous structures are intact. IMPRESSION: Cardiomegaly without acute pulmonary disease. Electronically Signed   By: Zerita Boers M.D.   On: 10/31/2020 14:57    EKG: Independently reviewed. Sinus tachycardia  Assessment/Plan Active Problems:   CHF (congestive heart failure) ([place])  (please populate well all problems here in Problem List. (For example, if patient is on BP meds at home and you resume or decide to hold them, it is a problem that needs to be her. Same for CAD, COPD, HLD and so on)  Acute on chronic systolic CHF decompensation -Discussed with on-call cardiac fellow, reviewed patient's record, which showed patient had a weight gain above 1.5 kg compared to 10/7 discharge record, cardiac recommend IV diuresis, will give 1 dose of 80 mg IV Lasix. -Continue other CHF medication including hydralazine/Imdur regimen. -Outpatient Entresto evaluation  Cocaine abuse -Doubt what patient told me that his last use of cocaine was more than a month ago, confront patient with UDS result, patient however maintained his verdict. -We will treat as if this is atypical angina with Ativan today and tomorrow  morning.  Positive troponins -Pattern is flat, likely from CHF decompensation  CKD stage II -Creatinine stable  DVT prophylaxis: Lovenox Code Status: Full code Family Communication: None at bedside Disposition Plan: Expect less than 24 hours hospital observation to treat CHF decompensation Consults called: Curbside consult with cardiology, reconsult if needed Admission status: Telemetry observation   Kaeo Jacome T  Danice Dippolito MD [facility] Pager [phone]  10/31/2020, 8:23 PM

## 2020-10-31 NOTE — ED Provider Notes (Addendum)
[place] EMERGENCY DEPARTMENT Provider Note   CSN: [id number] Arrival date & time: 10/31/20  1433     History Chief Complaint  Patient presents with  . Chest Pain  . Shortness of Breath    Tyler Wong is a 54 y.o. male.  Patient with hx cad, cardiomyopathy, chf, presents with epigastric pain in the past few days. Symptoms occur at rest, constant, dull, moderate, occasionally radiating up to chest, without specific exacerbating or alleviating factors. Pain is not pleuritic. No leg pain or swelling. No associated vomiting, or diaphoresis. Mild sob. No vomiting. Having normal bms. No gu c/o. Compliant w home meds. Urinating or normal amount. No fever or chills. No cough or uri symptoms.   The history is provided by the patient.  Chest Pain Associated symptoms: shortness of breath   Associated symptoms: no abdominal pain, no back pain, no fever, no headache, no palpitations and no vomiting   Shortness of Breath Associated symptoms: chest pain   Associated symptoms: no abdominal pain, no fever, no headaches, no neck pain, no rash, no sore throat and no vomiting        Past Medical History:  Diagnosis Date  . CAD in native artery 07/17/14   STEMI- LAD stenosis with DES  . CHF (congestive heart failure) ([place])   . Cocaine abuse ([place])   . COPD (chronic obstructive pulmonary disease) ([place])   . Dyspnea   . Hypertension   . ST elevation myocardial infarction (STEMI) involving left anterior descending (LAD) coronary artery with complication ([place])   . Tobacco abuse 07/17/2014    Patient Active Problem List   Diagnosis Date Noted  . Palliative care by specialist   . Goals of care, counseling/discussion   . Acute systolic HF (heart failure) ([place]) 10/03/2020  . Acute on chronic systolic CHF (congestive heart failure) ([place]) 10/03/2020  . Acute renal failure superimposed on stage 3a chronic kidney disease ([place])   . Acute respiratory disease due to COVID-19 virus  09/20/2020  . Acute respiratory failure due to COVID-19 ([place]) 09/19/2020  . Cardiorenal syndrome 06/29/2020  . 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 Acute on chronic clinical systolic heart failure ([place]) 06/22/2020  . Acute on chronic systolic (congestive) heart failure ([place]) 06/22/2020  . Palliative care encounter   . Cardiogenic shock ([place]) 04/11/2020  . CKD (chronic kidney disease) stage 3, GFR 30-59 ml/min ([facility]) 04/11/2020  . Symptomatic anemia 04/09/2020  . Acute on chronic HFrEF (heart failure with reduced ejection fraction) ([place]) 05/05/2019  . HFrEF (heart failure with reduced ejection fraction) ([place]) 05/04/2019  . Acute combined systolic and diastolic congestive heart failure ([place]) 10/02/2018  . Hepatitis C 04/13/2017  . Current non-adherence to medical treatment 04/13/2017  . Acute on chronic combined systolic (congestive) and diastolic (congestive) heart failure ([place]) 04/13/2017  . HTN (hypertension) 04/12/2017  . Insomnia 01/18/2017  . Chronic combined systolic and diastolic congestive heart failure ([place]) 01/04/2017  . Hyperlipidemia 01/04/2017  . Chest pain 12/30/2016  . Coronary artery disease involving native heart 07/18/2014  . Tobacco abuse 07/17/2014  . Cocaine use 07/17/2014    Past Surgical History:  Procedure Laterality Date  . CORONARY ANGIOPLASTY WITH STENT PLACEMENT  07/18/14   resolute DES to LAD STEMI  . LEFT HEART CATH N/A 07/17/2014   Procedure: LEFT HEART CATH;  Surgeon: [name], MD;  Location: [facility] CATH LAB;  Service: Cardiovascular;  Laterality: N/A;  . PERCUTANEOUS CORONARY STENT INTERVENTION (PCI-S)  07/17/2014  Procedure: PERCUTANEOUS CORONARY STENT INTERVENTION (PCI-S);  Surgeon: [name], MD;  Location: [facility] CATH LAB;  Service: Cardiovascular;;  . RIGHT/LEFT HEART CATH AND CORONARY ANGIOGRAPHY N/A 10/03/2018   Procedure: RIGHT/LEFT HEART CATH AND CORONARY ANGIOGRAPHY;  Surgeon: [name], MD;  Location: [facility]  CV LAB;  Service: Cardiovascular;  Laterality: N/A;       Family History  Problem Relation Age of Onset  . Cirrhosis Father   . Heart attack Mother   . Heart attack Sister   . Heart failure Sister   . Heart attack Brother     Social History   Tobacco Use  . Smoking status: Current Every Day Smoker    Packs/day: 0.25    Years: 30.00    Pack years: 7.50    Types: Cigarettes  . Smokeless tobacco: Never Used  . Tobacco comment: .5 PK PER DAY  Vaping Use  . Vaping Use: Never used  Substance Use Topics  . Alcohol use: No    Comment: Patient denies abuse, states social drinker  . Drug use: Yes    Types: Cocaine, "Crack" cocaine    Comment: heroin    Home Medications Prior to Admission medications   Medication Sig Start Date End Date Taking? Authorizing Provider  albuterol (VENTOLIN HFA) 108 (90 Base) MCG/ACT inhaler Inhale 2 puffs into the lungs every 4 (four) hours as needed for wheezing or shortness of breath. 01/24/20   [name], NP  aspirin 81 MG EC tablet Take 1 tablet (81 mg total) by mouth daily. 04/25/20   Lyda Jester M, PA-C  atorvastatin (LIPITOR) 80 MG tablet Take 1 tablet (80 mg total) by mouth daily. 10/09/20   Croitoru, Mihai, MD  digoxin (LANOXIN) 0.125 MG tablet Take 1 tablet (0.125 mg total) by mouth daily. 10/09/20   Croitoru, Mihai, MD  doxycycline (VIBRA-TABS) 100 MG tablet Take 1 tablet (100 mg total) by mouth every 12 (twelve) hours. Patient not taking: Reported on 10/27/2020 10/09/20   Darrick Grinder D, NP  empagliflozin (JARDIANCE) 10 MG TABS tablet Take 10 mg by mouth daily.    [provider]  isosorbide-hydrALAZINE (BIDIL) 20-37.5 MG tablet Take 1 tablet by mouth 3 (three) times daily.    [provider]  Multiple Vitamin (MULTIVITAMIN) tablet Take 1 tablet by mouth daily.     [provider]  omeprazole (PRILOSEC) 20 MG capsule Take 20 mg by mouth every morning. 10/22/20   [provider]  pantoprazole  (PROTONIX) 40 MG tablet Take 1 tablet (40 mg total) by mouth daily. 10/09/20   Clegg, Amy D, NP  potassium chloride SA (KLOR-CON) 20 MEQ tablet Take 1 tablet (20 mEq total) by mouth daily. 10/09/20   Croitoru, Mihai, MD  torsemide (DEMADEX) 20 MG tablet Take 4 tablets (80 mg total) by mouth daily. 10/09/20   Croitoru, Dani Gobble, MD    Allergies    Patient has no known allergies.  Review of Systems   Review of Systems  Constitutional: Negative for fever.  HENT: Negative for sore throat.   Eyes: Negative for redness.  Respiratory: Positive for shortness of breath.   Cardiovascular: Positive for chest pain. Negative for palpitations and leg swelling.  Gastrointestinal: Negative for abdominal pain and vomiting.  Endocrine: Negative for polyuria.  Genitourinary: Negative for dysuria and flank pain.  Musculoskeletal: Negative for back pain and neck pain.  Skin: Negative for rash.  Neurological: Negative for headaches.  Hematological: Does not bruise/bleed easily.  Psychiatric/Behavioral: Negative for confusion.  Physical Exam Updated Vital Signs BP 123/68   Pulse (!) 110   Temp 97.9 F (36.6 C) (Oral)   Resp 20   Ht 1.791 m (5' 10.5")   Wt 75.3 kg   SpO2 100%   BMI 23.48 kg/m   Physical Exam Vitals and nursing note reviewed.  Constitutional:      Appearance: Normal appearance. He is well-developed.  HENT:     Head: Atraumatic.     Nose: Nose normal.     Mouth/Throat:     Mouth: Mucous membranes are moist.     Pharynx: Oropharynx is clear.  Eyes:     General: No scleral icterus.    Conjunctiva/sclera: Conjunctivae normal.  Neck:     Trachea: No tracheal deviation.  Cardiovascular:     Rate and Rhythm: Normal rate and regular rhythm.     Pulses: Normal pulses.     Heart sounds: Normal heart sounds. No murmur heard.  No friction rub. No gallop.   Pulmonary:     Effort: Pulmonary effort is normal. No accessory muscle usage or respiratory distress.     Breath sounds:  Normal breath sounds.  Abdominal:     General: Bowel sounds are normal. There is no distension.     Palpations: Abdomen is soft. There is no mass.     Tenderness: There is no abdominal tenderness. There is no guarding or rebound.     Hernia: No hernia is present.  Genitourinary:    Comments: No cva tenderness. Musculoskeletal:        General: No swelling or tenderness.     Cervical back: Normal range of motion and neck supple. No rigidity.     Right lower leg: No edema.     Left lower leg: No edema.  Skin:    General: Skin is warm and dry.     Findings: No rash.  Neurological:     Mental Status: He is alert.     Comments: Alert, speech clear.   Psychiatric:        Mood and Affect: Mood normal.     ED Results / Procedures / Treatments   Labs (all labs ordered are listed, but only abnormal results are displayed) Results for orders placed or performed during the hospital encounter of 54/65/03  Basic metabolic panel  Result Value Ref Range   Sodium 137 135 - 145 mmol/L   Potassium 3.5 3.5 - 5.1 mmol/L   Chloride 94 (L) 98 - 111 mmol/L   CO2 29 22 - 32 mmol/L   Glucose, Bld 123 (H) 70 - 99 mg/dL   BUN 31 (H) 6 - 20 mg/dL   Creatinine, Ser 1.64 (H) 0.61 - 1.24 mg/dL   Calcium 9.2 8.9 - 10.3 mg/dL   GFR, Estimated 49 (L) >60 mL/min   Anion gap 14 5 - 15  CBC  Result Value Ref Range   WBC 7.7 4.0 - 10.5 K/uL   RBC 4.98 4.22 - 5.81 MIL/uL   Hemoglobin 14.3 13.0 - 17.0 g/dL   HCT 45.6 39 - 52 %   MCV 91.6 80.0 - 100.0 fL   MCH 28.7 26.0 - 34.0 pg   MCHC 31.4 30.0 - 36.0 g/dL   RDW 17.7 (H) 11.5 - 15.5 %   Platelets 235 150 - 400 K/uL   nRBC 0.0 0.0 - 0.2 %  Troponin I (High Sensitivity)  Result Value Ref Range   Troponin I (High Sensitivity) 227 (HH) <18 ng/L   DG Chest  2 View  Result Date: 10/31/2020 CLINICAL DATA:  Chest pain, shortness of breath EXAM: CHEST - 2 VIEW COMPARISON:  Chest radiograph dated 10/08/2020. FINDINGS: The heart is enlarged. The lungs are clear  and there is no pleural effusion or pneumothorax. The osseous structures are intact. IMPRESSION: Cardiomegaly without acute pulmonary disease. Electronically Signed   By: Zerita Boers M.D.   On: 10/31/2020 14:57   DG CHEST PORT 1 VIEW  Result Date: 10/08/2020 CLINICAL DATA:  PICC placement EXAM: PORTABLE CHEST 1 VIEW COMPARISON:  10/03/2020 FINDINGS: Right PICC line tip overlies the SVC. Pulmonary vascular congestion. No pleural effusion. Cardiomegaly. No pneumothorax. IMPRESSION: PICC line tip overlies SVC. Pulmonary vascular congestion and cardiomegaly. Electronically Signed   By: Macy Mis M.D.   On: 10/08/2020 11:04   DG Chest Port 1 View  Result Date: 10/03/2020 CLINICAL DATA:  The patient was discharged from the hospital 09/22/2020 after admission for COVID-19 pneumonia. Abdominal pain today. EXAM: PORTABLE CHEST 1 VIEW COMPARISON:  Single-view of the chest 09/19/2020. PA and lateral chest 12/13/2017. FINDINGS: Small focus of scar in right upper lobe noted. The lungs are otherwise clear. There is cardiomegaly. Aortic atherosclerosis. No pneumothorax or pleural effusion. No acute or focal bony abnormality. IMPRESSION: Cardiomegaly without acute disease. Electronically Signed   By: Inge Rise M.D.   On: 10/03/2020 12:10   ECHOCARDIOGRAM COMPLETE  Result Date: 10/04/2020    ECHOCARDIOGRAM REPORT   Patient Name:   Tyler Wong Date of Exam: 10/04/2020 Medical Rec #:  [id number]   Height:       70.0 in Accession #:    [id number]  Weight:       166.2 lb Date of Birth:  [birth year]  BSA:          1.930 m Patient Age:    69 years    BP:           92/60 mmHg Patient Gender: M           HR:           109 bpm. Exam Location:  Inpatient Procedure: 2D Echo, 3D Echo, Cardiac Doppler and Color Doppler Indications:    I50.40* Unspecified combined systolic (congestive) and diastolic                 (congestive) heart failure  History:        Patient has prior history of Echocardiogram examinations. CHF,                  Previous Myocardial Infarction and CAD, Abnormal ECG,                 Signs/Symptoms:Chest Pain; Risk Factors:Current Smoker and                 Hypertension. Cardiogenic shock. Cocaine use. COvid 19 infection                 14 days ago.  Sonographer:    Roseanna Rainbow RDCS Referring Phys: [place] Comments: Patient moving throughout exam. IMPRESSIONS  1. 3 D images of the LV were obtained. . Left ventricular ejection fraction, by estimation, is 20 to 25%. The left ventricle has severely decreased function. The left ventricle demonstrates global hypokinesis. The left ventricular internal cavity size was moderately dilated. There is mild left ventricular hypertrophy. Left ventricular diastolic parameters are consistent with Grade II diastolic dysfunction (pseudonormalization).  2. Right ventricular systolic function is low normal. The right ventricular  size is normal. There is moderately elevated pulmonary artery systolic pressure. The estimated right ventricular systolic pressure is 19.5 mmHg.  3. Left atrial size was moderately dilated.  4. Right atrial size was severely dilated.  5. The mitral valve is grossly normal. Moderate to severe mitral valve regurgitation. Mild mitral stenosis.  6. Tricuspid valve regurgitation is moderate to severe.  7. The aortic valve is normal in structure. Aortic valve regurgitation is not visualized. No aortic stenosis is present. FINDINGS  Left Ventricle: 3 D images of the LV were obtained. Left ventricular ejection fraction, by estimation, is 20 to 25%. The left ventricle has severely decreased function. The left ventricle demonstrates global hypokinesis. The left ventricular internal cavity size was moderately dilated. There is mild left ventricular hypertrophy. Left ventricular diastolic parameters are consistent with Grade II diastolic dysfunction (pseudonormalization). Right Ventricle: The right ventricular size is normal. No increase in right  ventricular wall thickness. Right ventricular systolic function is low normal. There is moderately elevated pulmonary artery systolic pressure. The tricuspid regurgitant velocity  is 3.23 m/s, and with an assumed right atrial pressure of 15 mmHg, the estimated right ventricular systolic pressure is 09.3 mmHg. Left Atrium: Left atrial size was moderately dilated. Right Atrium: Right atrial size was severely dilated. Pericardium: There is no evidence of pericardial effusion. Mitral Valve: The mitral valve is grossly normal. There is mild thickening of the mitral valve leaflet(s). Moderate to severe mitral valve regurgitation. Mild mitral valve stenosis. MV peak gradient, 14.3 mmHg. The mean mitral valve gradient is 5.0 mmHg. Tricuspid Valve: The tricuspid valve is grossly normal. Tricuspid valve regurgitation is moderate to severe. Aortic Valve: The aortic valve is normal in structure. Aortic valve regurgitation is not visualized. No aortic stenosis is present. Pulmonic Valve: The pulmonic valve was grossly normal. Pulmonic valve regurgitation is trivial. No evidence of pulmonic stenosis. Aorta: The aortic root and ascending aorta are structurally normal, with no evidence of dilitation. IAS/Shunts: The atrial septum is grossly normal.  LEFT VENTRICLE PLAX 2D LVIDd:         6.30 cm      Diastology LVIDs:         5.90 cm      LV e' medial:    8.05 cm/s LV PW:         1.60 cm      LV E/e' medial:  9.9 LV IVS:        1.20 cm      LV e' lateral:   16.80 cm/s LVOT diam:     2.00 cm      LV E/e' lateral: 4.8 LV SV:         34 LV SV Index:   17 LVOT Area:     3.14 cm  LV Volumes (MOD) LV vol d, MOD A2C: 298.0 ml LV vol d, MOD A4C: 197.3 ml LV vol s, MOD A2C: 253.0 ml LV vol s, MOD A4C: 207.3 ml LV SV MOD A2C:     45.0 ml LV SV MOD A4C:     197.3 ml LV SV MOD BP:      -30.0 ml RIGHT VENTRICLE             IVC RV S prime:     16.60 cm/s  IVC diam: 2.80 cm TAPSE (M-mode): 1.7 cm LEFT ATRIUM            Index       RIGHT ATRIUM  Index LA diam:      6.00 cm  3.11 cm/m  RA Area:     36.00 cm LA Vol (A2C): 108.0 ml 55.97 ml/m RA Volume:   154.00 ml 79.81 ml/m LA Vol (A4C): 77.8 ml  40.32 ml/m  AORTIC VALVE LVOT Vmax:   80.20 cm/s LVOT Vmean:  56.000 cm/s LVOT VTI:    0.107 m  AORTA Ao Root diam: 3.20 cm Ao Asc diam:  3.40 cm MITRAL VALVE                 TRICUSPID VALVE MV Area (PHT): 5.02 cm      TR Peak grad:   41.7 mmHg MV Peak grad:  14.3 mmHg     TR Vmax:        323.00 cm/s MV Mean grad:  5.0 mmHg MV Vmax:       1.89 m/s      SHUNTS MV Vmean:      99.1 cm/s     Systemic VTI:  0.11 m MV Decel Time: 151 msec      Systemic Diam: 2.00 cm MR Peak grad:    84.6 mmHg MR Mean grad:    53.0 mmHg MR Vmax:         460.00 cm/s MR Vmean:        338.0 cm/s MR PISA:         1.01 cm MR PISA Eff ROA: 9 mm MR PISA Radius:  0.40 cm MV E velocity: 79.87 cm/s MV A velocity: 99.00 cm/s MV E/A ratio:  0.81 Mertie Moores MD Electronically signed by Mertie Moores MD Signature Date/Time: 10/04/2020/1:26:01 PM    Final    VAS [place] UPPER EXTREMITY VENOUS DUPLEX  Result Date: 10/09/2020 UPPER VENOUS STUDY  Indications: Edema Risk Factors: Surgery Heart Cath 10/03/2020. Performing Technologist: Griffin Basil RCT RDMS  Examination Guidelines: A complete evaluation includes B-mode imaging, spectral Doppler, color Doppler, and power Doppler as needed of all accessible portions of each vessel. Bilateral testing is considered an integral part of a complete examination. Limited examinations for reoccurring indications may be performed as noted.  Right Findings: +----------+------------+---------+-----------+----------------+-------+ RIGHT     CompressiblePhasicitySpontaneous   Properties   Summary +----------+------------+---------+-----------+----------------+-------+ IJV           Full       Yes       Yes                            +----------+------------+---------+-----------+----------------+-------+ Subclavian    Full       Yes        Yes                            +----------+------------+---------+-----------+----------------+-------+ Axillary      Full       Yes       Yes                            +----------+------------+---------+-----------+----------------+-------+ Brachial    Partial      Yes       Yes    softly echogenic Acute  +----------+------------+---------+-----------+----------------+-------+ Radial        Full                                                +----------+------------+---------+-----------+----------------+-------+  Ulnar         Full                                                +----------+------------+---------+-----------+----------------+-------+ Cephalic      Full                                                +----------+------------+---------+-----------+----------------+-------+ Basilic       Full                                                +----------+------------+---------+-----------+----------------+-------+  Left Findings: +----+------------+---------+-----------+----------+-------+ LEFTCompressiblePhasicitySpontaneousPropertiesSummary +----+------------+---------+-----------+----------+-------+ IJV     Full       Yes       Yes                      +----+------------+---------+-----------+----------+-------+  Summary:  Right: No evidence of superficial vein thrombosis in the upper extremity. Findings consistent with acute deep vein thrombosis involving the right brachial veins. Small right brachiaal vein thrombus - non occlusive.  *See table(s) above for measurements and observations.  Diagnosing physician: Curt Jews MD Electronically signed by Curt Jews MD on 10/09/2020 at 3:09:18 PM.    Final    [place] EKG SITE RITE  Result Date: 10/03/2020 If Site Rite image not attached, placement could not be confirmed due to current cardiac rhythm.   EKG EKG Interpretation  Date/Time:  Thursday October 31 2020 14:32:52 EDT Ventricular Rate:    111 PR Interval:  186 QRS Duration: 96 QT Interval:  332 QTC Calculation: 451 R Axis:   -119 Text Interpretation: Sinus tachycardia Right superior axis deviation Left ventricular hypertrophy Non-specific ST-t changes Confirmed by Lajean Saver [phone]) on 10/31/2020 3:57:41 PM   Radiology DG Chest 2 View  Result Date: 10/31/2020 CLINICAL DATA:  Chest pain, shortness of breath EXAM: CHEST - 2 VIEW COMPARISON:  Chest radiograph dated 10/08/2020. FINDINGS: The heart is enlarged. The lungs are clear and there is no pleural effusion or pneumothorax. The osseous structures are intact. IMPRESSION: Cardiomegaly without acute pulmonary disease. Electronically Signed   By: Zerita Boers M.D.   On: 10/31/2020 14:57    Procedures Procedures (including critical care time)  Medications Ordered in ED Medications - No data to display  ED Course  I have reviewed the triage vital signs and the nursing notes.  Pertinent labs & imaging results that were available during my care of the patient were reviewed by me and considered in my medical decision making (see chart for details).    MDM Rules/Calculators/A&P                         Iv ns. Continuous pulse ox and cardiac monitoring. Stat labs and imaging.  Reviewed nursing notes and prior charts for additional history. Recent admission for CHF.  Pt with noted EF < 20%.   MDM Number of Diagnoses or Management Options   Amount and/or Complexity of Data Reviewed Clinical lab tests: ordered and reviewed Tests in the radiology section of CPT: ordered and  reviewed Tests in the medicine section of CPT: ordered and reviewed Discussion of test results with the performing providers: yes Decide to obtain previous medical records or to obtain history from someone other than the patient: yes Obtain history from someone other than the patient: yes Review and summarize past medical records: yes Discuss the patient with other providers: yes Independent  visualization of images, tracings, or specimens: yes  Risk of Complications, Morbidity, and/or Mortality Presenting problems: high Diagnostic procedures: high Management options: high   Initial labs reviewed/interpreted by me - initial trop high.   CXR reviewed/interpreted by me - no pna.  Will consult medicine for admission. May benefit from cardiology consultation during admission.   Discussed pt with hospitalist - they will admit.    Final Clinical Impression(s) / ED Diagnoses Final diagnoses:  None    Rx / DC Orders ED Discharge Orders    None         Lajean Saver, MD 10/31/20 [phone]

## 2020-10-31 NOTE — ED Notes (Signed)
Notified MD about patient BP

## 2020-10-31 NOTE — ED Notes (Signed)
Date and time results received: 10/31/20 1607 (use smartphrase ".now" to insert current time)  Test: Trop Critical Value: 227 Name of Provider Notified: [place]   Orders Received? Or Actions Taken?:

## 2020-10-31 NOTE — ED Notes (Signed)
MD at bedside. 

## 2020-10-31 NOTE — ED Notes (Signed)
Pt resting in bed. Reporting SOB

## 2020-11-01 ENCOUNTER — Telehealth (HOSPITAL_COMMUNITY): Payer: Self-pay | Admitting: Licensed Clinical Social Worker

## 2020-11-01 ENCOUNTER — Observation Stay (HOSPITAL_COMMUNITY): Payer: Medicaid Other

## 2020-11-01 ENCOUNTER — Encounter (HOSPITAL_COMMUNITY): Payer: Self-pay | Admitting: Internal Medicine

## 2020-11-01 ENCOUNTER — Other Ambulatory Visit: Payer: Self-pay

## 2020-11-01 DIAGNOSIS — N1831 Chronic kidney disease, stage 3a: Secondary | ICD-10-CM | POA: Diagnosis present

## 2020-11-01 DIAGNOSIS — R1084 Generalized abdominal pain: Secondary | ICD-10-CM | POA: Diagnosis not present

## 2020-11-01 DIAGNOSIS — R933 Abnormal findings on diagnostic imaging of other parts of digestive tract: Secondary | ICD-10-CM | POA: Diagnosis not present

## 2020-11-01 DIAGNOSIS — I509 Heart failure, unspecified: Secondary | ICD-10-CM | POA: Diagnosis present

## 2020-11-01 DIAGNOSIS — I429 Cardiomyopathy, unspecified: Secondary | ICD-10-CM | POA: Diagnosis not present

## 2020-11-01 DIAGNOSIS — I5021 Acute systolic (congestive) heart failure: Secondary | ICD-10-CM | POA: Diagnosis not present

## 2020-11-01 DIAGNOSIS — I252 Old myocardial infarction: Secondary | ICD-10-CM | POA: Diagnosis not present

## 2020-11-01 DIAGNOSIS — C169 Malignant neoplasm of stomach, unspecified: Secondary | ICD-10-CM

## 2020-11-01 DIAGNOSIS — K229 Disease of esophagus, unspecified: Secondary | ICD-10-CM | POA: Diagnosis not present

## 2020-11-01 DIAGNOSIS — Z7982 Long term (current) use of aspirin: Secondary | ICD-10-CM | POA: Diagnosis not present

## 2020-11-01 DIAGNOSIS — C163 Malignant neoplasm of pyloric antrum: Secondary | ICD-10-CM | POA: Diagnosis present

## 2020-11-01 DIAGNOSIS — E785 Hyperlipidemia, unspecified: Secondary | ICD-10-CM | POA: Diagnosis not present

## 2020-11-01 DIAGNOSIS — R1013 Epigastric pain: Secondary | ICD-10-CM | POA: Diagnosis not present

## 2020-11-01 DIAGNOSIS — N183 Chronic kidney disease, stage 3 unspecified: Secondary | ICD-10-CM | POA: Diagnosis not present

## 2020-11-01 DIAGNOSIS — R079 Chest pain, unspecified: Secondary | ICD-10-CM | POA: Diagnosis not present

## 2020-11-01 DIAGNOSIS — I110 Hypertensive heart disease with heart failure: Secondary | ICD-10-CM | POA: Diagnosis not present

## 2020-11-01 DIAGNOSIS — Z8249 Family history of ischemic heart disease and other diseases of the circulatory system: Secondary | ICD-10-CM | POA: Diagnosis not present

## 2020-11-01 DIAGNOSIS — I340 Nonrheumatic mitral (valve) insufficiency: Secondary | ICD-10-CM | POA: Diagnosis present

## 2020-11-01 DIAGNOSIS — J449 Chronic obstructive pulmonary disease, unspecified: Secondary | ICD-10-CM | POA: Diagnosis not present

## 2020-11-01 DIAGNOSIS — Z8616 Personal history of COVID-19: Secondary | ICD-10-CM | POA: Diagnosis not present

## 2020-11-01 DIAGNOSIS — I5023 Acute on chronic systolic (congestive) heart failure: Secondary | ICD-10-CM | POA: Diagnosis not present

## 2020-11-01 DIAGNOSIS — I5084 End stage heart failure: Secondary | ICD-10-CM | POA: Diagnosis present

## 2020-11-01 DIAGNOSIS — R0602 Shortness of breath: Secondary | ICD-10-CM | POA: Diagnosis not present

## 2020-11-01 DIAGNOSIS — K2289 Other specified disease of esophagus: Secondary | ICD-10-CM | POA: Diagnosis present

## 2020-11-01 DIAGNOSIS — R072 Precordial pain: Secondary | ICD-10-CM | POA: Diagnosis not present

## 2020-11-01 DIAGNOSIS — F1410 Cocaine abuse, uncomplicated: Secondary | ICD-10-CM | POA: Diagnosis present

## 2020-11-01 DIAGNOSIS — I5043 Acute on chronic combined systolic (congestive) and diastolic (congestive) heart failure: Secondary | ICD-10-CM

## 2020-11-01 DIAGNOSIS — F17210 Nicotine dependence, cigarettes, uncomplicated: Secondary | ICD-10-CM | POA: Diagnosis present

## 2020-11-01 DIAGNOSIS — I420 Dilated cardiomyopathy: Secondary | ICD-10-CM | POA: Diagnosis present

## 2020-11-01 DIAGNOSIS — K259 Gastric ulcer, unspecified as acute or chronic, without hemorrhage or perforation: Secondary | ICD-10-CM | POA: Diagnosis not present

## 2020-11-01 DIAGNOSIS — Z79899 Other long term (current) drug therapy: Secondary | ICD-10-CM | POA: Diagnosis not present

## 2020-11-01 DIAGNOSIS — I255 Ischemic cardiomyopathy: Secondary | ICD-10-CM | POA: Diagnosis present

## 2020-11-01 DIAGNOSIS — I2510 Atherosclerotic heart disease of native coronary artery without angina pectoris: Secondary | ICD-10-CM | POA: Diagnosis present

## 2020-11-01 DIAGNOSIS — N179 Acute kidney failure, unspecified: Secondary | ICD-10-CM | POA: Diagnosis present

## 2020-11-01 DIAGNOSIS — I130 Hypertensive heart and chronic kidney disease with heart failure and stage 1 through stage 4 chronic kidney disease, or unspecified chronic kidney disease: Secondary | ICD-10-CM | POA: Diagnosis present

## 2020-11-01 DIAGNOSIS — K219 Gastro-esophageal reflux disease without esophagitis: Secondary | ICD-10-CM | POA: Diagnosis present

## 2020-11-01 DIAGNOSIS — K2970 Gastritis, unspecified, without bleeding: Secondary | ICD-10-CM | POA: Diagnosis not present

## 2020-11-01 DIAGNOSIS — Z955 Presence of coronary angioplasty implant and graft: Secondary | ICD-10-CM | POA: Diagnosis not present

## 2020-11-01 HISTORY — DX: Malignant neoplasm of stomach, unspecified: C16.9

## 2020-11-01 LAB — BASIC METABOLIC PANEL
Anion gap: 13 (ref 5–15)
BUN: 35 mg/dL — ABNORMAL HIGH (ref 6–20)
CO2: 25 mmol/L (ref 22–32)
Calcium: 9 mg/dL (ref 8.9–10.3)
Chloride: 96 mmol/L — ABNORMAL LOW (ref 98–111)
Creatinine, Ser: 1.58 mg/dL — ABNORMAL HIGH (ref 0.61–1.24)
GFR, Estimated: 52 mL/min — ABNORMAL LOW (ref >60)
Glucose, Bld: 128 mg/dL — ABNORMAL HIGH (ref 70–99)
Potassium: 3.8 mmol/L (ref 3.5–5.1)
Sodium: 134 mmol/L — ABNORMAL LOW (ref 135–145)

## 2020-11-01 MED ORDER — FUROSEMIDE 10 MG/ML IJ SOLN
8.0000 mg/h | INTRAVENOUS | Status: DC
  Administered 2020-11-01: 10 mg/h via INTRAVENOUS
  Administered 2020-11-02 – 2020-11-03 (×2): 8 mg/h via INTRAVENOUS
  Filled 2020-11-01 (×4): qty 20

## 2020-11-01 NOTE — ED Notes (Signed)
Ambulated patient in the hallway, pulse ox remained above 97% while ambulating.

## 2020-11-01 NOTE — ED Notes (Signed)
Inpatient Provider at Bedside

## 2020-11-01 NOTE — ED Notes (Signed)
Lunch Tray Ordered @ 1021. 

## 2020-11-01 NOTE — Consult Note (Addendum)
Advanced Heart Failure Team Consult Note   Primary Physician: [name], NP PCP-Cardiologist:  Shelva Majestic, MD  [facility]: Dr. Aundra Dubin   Reason for Consultation: Acute on Chronic Systolic Heart Failure   HPI:    Tyler Wong is seen today for evaluation of acute on chronic systolic heart failure,  at the request of Dr. Lupita Leash, Internal Medicine.   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 Also w/ h/o poor compliance. Previous referred to paramedicine but not fully cooperative. Recently diagnosed w/ COVID 09/19/20.   Had recent admit earlier this month for a/c systolic heart failure w/ low out-put and AKI requiring milrinone to help w/ diuresis. He was cocaine positive. Diuresed and weaned off milrinone w/ stable co-ox at 65%. Scr had improved from 2.1>>1.7. D/c wt 159 lb. Unfortunately, his wife was also hospitalized for COVID and died while he was admitted.   Had return clinic visit last week for post hospital and admitted to ongoing cocaine use. Also report 2 days of missed medications and was mildly volume overloaded. He was up 7 lb from his d/c wt w/ NYHA class II-III symptoms. He was instructed to improve compliance w/ meds and his diuretics were increased. He was instructed to increase torsemide to 100 mg daily x 2 days, then return to 80 mg daily thereafter.  Unfortunately, he is now back in the ED w/ [name] and also cocaine +. Main complaint is epigastric pain, likely from low output HF. CT of abdomen however was obtained in the ED showing no definite acute findings, however there is a mass-like area in the antral pre-pyloric region of the stomach. This may simply represent ingested food, however, cannot r/o the possibility of a primary gastric  neoplasm. Lipase WNL at 43 U/L. HFTs WNL.   CXR w/ cardiomegaly but no overt edema. BNP 1,322. Hs trop 227>>190. SCr 1.64>>1.58. K 3.8. Na 134. HFTs WNL. Dig level 0.2. Narrow pulse pressure, BP 99/81.     Echo 11/11/20 3 D images of the LV were obtained. . Left ventricular ejection fraction, by estimation, is 20 to 25%. The left ventricle has severely decreased function. The left ventricle demonstrates global hypokinesis. The left ventricular internal cavity size was moderately dilated. There is mild left ventricular hypertrophy. Left ventricular diastolic parameters are consistent with Grade II diastolic dysfunction (pseudonormalization). 2. Right ventricular systolic function is low normal. The right ventricular size is normal. There is moderately elevated pulmonary artery systolic pressure. The estimated right ventricular systolic pressure is 78.5 mmHg. 3. Left atrial size was moderately dilated. 4. Right atrial size was severely dilated. 5. The mitral valve is grossly normal. Moderate to severe mitral valve regurgitation. Mild mitral stenosis. 6. Tricuspid valve regurgitation is moderate to severe. 7. The aortic valve is normal in structure. Aortic valve regurgitation is not visualized. No aortic stenosis is present.   Review of Systems: [y] = yes, [ ]  = no   . General: Weight gain [ ] ; Weight loss [ ] ; Anorexia [ ] ; Fatigue [ ] ; Fever [ ] ; Chills [ ] ; Weakness [ ]   . Cardiac: Chest pain/pressure [ ] ; Resting SOB [ ] ; Exertional SOB [ ] ; Orthopnea [ ] ; Pedal Edema [ ] ; Palpitations [ ] ; Syncope [ ] ; Presyncope [ ] ;  Paroxysmal nocturnal dyspnea[ ]   . Pulmonary: Cough [ ] ; Wheezing[ ] ; Hemoptysis[ ] ; Sputum [ ] ; Snoring [ ]   . GI: Vomiting[ ] ; Dysphagia[ ] ; Melena[ ] ; Hematochezia [ ] ; Heartburn[ ] ; Abdominal pain [ Y]; Constipation [ ] ; Diarrhea [ ] ; BRBPR [ ]   . GU: Hematuria[ ] ; Dysuria [ ] ; Nocturia[ ]   . Vascular: Pain in legs with walking [ ] ; Pain in feet with lying flat [ ] ;  Non-healing sores [ ] ; Stroke [ ] ; TIA [ ] ; Slurred speech [ ] ;  . Neuro: Headaches[ ] ; Vertigo[ ] ; Seizures[ ] ; Paresthesias[ ] ;Blurred vision [ ] ; Diplopia [ ] ; Vision changes [ ]   . Ortho/Skin: Arthritis [ ] ; Joint pain [ ] ; Muscle pain [ ] ; Joint swelling [ ] ; Back Pain [ ] ; Rash [ ]   . Psych: Depression[ ] ; Anxiety[ ]   . Heme: Bleeding problems [ ] ; Clotting disorders [ ] ; Anemia [ ]   . Endocrine: Diabetes [ ] ; Thyroid dysfunction[ ]   Home Medications Prior to Admission medications   Medication Sig Start Date End Date Taking? Authorizing Provider  albuterol (VENTOLIN HFA) 108 (90 Base) MCG/ACT inhaler Inhale 2 puffs into the lungs every 4 (four) hours as needed for wheezing or shortness of breath. 01/24/20  Yes [name], NP  aspirin 81 MG EC tablet Take 1 tablet (81 mg total) by mouth daily. 04/25/20  Yes Rosita Fire, Brittainy M, PA-C  atorvastatin (LIPITOR) 80 MG tablet Take 1 tablet (80 mg total) by mouth daily. 10/09/20  Yes Croitoru, Mihai, MD  digoxin (LANOXIN) 0.125 MG tablet Take 1 tablet (0.125 mg total) by mouth daily. 10/09/20  Yes Croitoru, Mihai, MD  empagliflozin (JARDIANCE) 10 MG TABS tablet Take 10 mg by mouth daily.   Yes [provider]  isosorbide-hydrALAZINE (BIDIL) 20-37.5 MG tablet Take 1 tablet by mouth 3 (three) times daily.   Yes [provider]  Multiple Vitamin (MULTIVITAMIN) tablet Take 1 tablet by mouth daily.    Yes [provider]  omeprazole (PRILOSEC) 20 MG capsule Take 20 mg by mouth every morning. 10/22/20  Yes [provider]  pantoprazole (PROTONIX) 40 MG tablet Take 1 tablet (40 mg total) by mouth daily. 10/09/20  Yes Clegg, Amy D, NP  potassium chloride SA (KLOR-CON) 20 MEQ tablet Take 1 tablet (20 mEq total) by mouth daily. 10/09/20  Yes Croitoru, Mihai, MD  torsemide (DEMADEX) 20 MG tablet Take 4 tablets (80 mg total) by mouth daily. 10/09/20  Yes Croitoru, Mihai, MD  doxycycline (VIBRA-TABS) 100 MG tablet  Take 1 tablet (100 mg total) by mouth every 12 (twelve) hours. Patient not taking: Reported on 10/27/2020 10/09/20   Conrad , NP    Past Medical History: Past Medical History:  Diagnosis Date  . CAD in native artery 07/17/14   STEMI- LAD stenosis with DES  . CHF (congestive heart failure) ([place])   . Cocaine abuse ([place])   . COPD (chronic obstructive pulmonary disease) ([place])   . Dyspnea   . Hypertension   . ST elevation myocardial infarction (STEMI) involving left anterior descending (LAD) coronary artery with complication ([place])   . Tobacco abuse 07/17/2014    Past Surgical History: Past Surgical History:  Procedure Laterality Date  . CORONARY ANGIOPLASTY WITH STENT PLACEMENT  07/18/14   resolute DES to LAD STEMI  . LEFT HEART CATH N/A 07/17/2014   Procedure: LEFT HEART CATH;  Surgeon: [name], MD;  Location: [facility] CATH LAB;  Service: Cardiovascular;  Laterality: N/A;  . PERCUTANEOUS CORONARY  STENT INTERVENTION (PCI-S)  07/17/2014   Procedure: PERCUTANEOUS CORONARY STENT INTERVENTION (PCI-S);  Surgeon: [name], MD;  Location: [facility] CATH LAB;  Service: Cardiovascular;;  . RIGHT/LEFT HEART CATH AND CORONARY ANGIOGRAPHY N/A 10/03/2018   Procedure: RIGHT/LEFT HEART CATH AND CORONARY ANGIOGRAPHY;  Surgeon: [name], MD;  Location: [facility] CV LAB;  Service: Cardiovascular;  Laterality: N/A;    Family History: Family History  Problem Relation Age of Onset  . Cirrhosis Father   . Heart attack Mother   . Heart attack Sister   . Heart failure Sister   . Heart attack Brother     Social History: Social History   Socioeconomic History  . Marital status: Single    Spouse name: Not on file  . Number of children: 2  . Years of education: 41  . Highest education level: 12th grade  Occupational History  . Occupation: Landscaping  Tobacco Use  . Smoking status: Current Every Day Smoker    Packs/day: 0.25    Years: 30.00    Pack years: 7.50    Types: Cigarettes    . Smokeless tobacco: Never Used  . Tobacco comment: .5 PK PER DAY  Vaping Use  . Vaping Use: Never used  Substance and Sexual Activity  . Alcohol use: No    Comment: Patient denies abuse, states social drinker  . Drug use: Yes    Types: Cocaine, "Crack" cocaine    Comment: heroin  . Sexual activity: Yes  Other Topics Concern  . Not on file  Social History Narrative   Lives in [place] with his sister and girlfriend. He works Aeronautical engineer work.    Social Determinants of Health   Financial Resource Strain:   . Difficulty of Paying Living Expenses: Not on file  Food Insecurity: Food Insecurity Present  . Worried About Charity fundraiser in the Last Year: Sometimes true  . Ran Out of Food in the Last Year: Never true  Transportation Needs: No Transportation Needs  . Lack of Transportation (Medical): No  . Lack of Transportation (Non-Medical): No  Physical Activity:   . Days of Exercise per Week: Not on file  . Minutes of Exercise per Session: Not on file  Stress:   . Feeling of Stress : Not on file  Social Connections:   . Frequency of Communication with Friends and Family: Not on file  . Frequency of Social Gatherings with Friends and Family: Not on file  . Attends Religious Services: Not on file  . Active Member of Clubs or Organizations: Not on file  . Attends Archivist Meetings: Not on file  . Marital Status: Not on file    Allergies:  No Known Allergies  Objective:    Vital Signs:   Temp:  [97.9 F (36.6 C)] 97.9 F (36.6 C) (11/04 1444) Pulse Rate:  [34-226] 108 (11/05 1200) Resp:  [12-32] 19 (11/05 1200) BP: (77-152)/(41-136) 99/52 (11/05 1200) SpO2:  [93 %-100 %] 99 % (11/05 1200) Weight:  [75.3 kg] 75.3 kg (11/04 1440)    Weight change: Filed Weights   10/31/20 1440  Weight: 75.3 kg    Intake/Output:   Intake/Output Summary (Last 24 hours) at 11/01/2020 1249 Last data filed at 10/31/2020 2236 Gross per 24 hour  Intake  --  Output 400 ml  Net -400 ml      Physical Exam    General:  Well appearing, thin middle aged male. No resp difficulty HEENT: normal Neck: supple.  JVP 12 cm . Carotids 2+ bilat; no bruits. No lymphadenopathy or thyromegaly appreciated. Cor: PMI nondisplaced. Regular rate & rhythm. 3/6 MR murmur at apex  Lungs: decreased BS at the bases bilaterally  Abdomen: soft, nontender, nondistended. No hepatosplenomegaly. No bruits or masses. Good bowel sounds. Extremities: no cyanosis, clubbing, rash, edema Neuro: alert & orientedx3, cranial nerves grossly intact. moves all 4 extremities w/o difficulty. Affect pleasant   Telemetry   NSR- sinus tach, low 100s   EKG    Sinus tach 111 bpm   Labs   Basic Metabolic Panel: Recent Labs  Lab 10/27/20 2221 10/31/20 1451 11/01/20 0205  NA 140 137 134*  K 3.6 3.5 3.8  CL 99 94* 96*  CO2 27 29 25   GLUCOSE 158* 123* 128*  BUN 45* 31* 35*  CREATININE 1.80* 1.64* 1.58*  CALCIUM 8.9 9.2 9.0    Liver Function Tests: Recent Labs  Lab 10/27/20 2221 10/31/20 1604  AST 35 31  ALT 36 32  ALKPHOS 116 113  BILITOT 0.8 1.5*  PROT 7.1 7.2  ALBUMIN 3.4* 3.3*   Recent Labs  Lab 10/31/20 1604  LIPASE 43   No results for input(s): AMMONIA in the last 168 hours.  CBC: Recent Labs  Lab 10/27/20 2221 10/31/20 1451  WBC 9.2 7.7  NEUTROABS 6.5  --   HGB 12.8* 14.3  HCT 38.7* 45.6  MCV 87.2 91.6  PLT 290 235    Cardiac Enzymes: No results for input(s): CKTOTAL, CKMB, CKMBINDEX, TROPONINI in the last 168 hours.  BNP: BNP (last 3 results) Recent Labs    09/21/20 0138 10/03/20 1222 10/31/20 1604  BNP 578.1* 2,190.9* 1,322.2*    ProBNP (last 3 results) Recent Labs    01/30/20 1645  PROBNP 1,759*     CBG: No results for input(s): GLUCAP in the last 168 hours.  Coagulation Studies: No results for input(s): LABPROT, INR in the last 72 hours.   Imaging   CT ABDOMEN PELVIS WO CONTRAST  Result Date:  11/01/2020 CLINICAL DATA:  54 year old male with history of epigastric abdominal pain for the past few days. EXAM: CT ABDOMEN AND PELVIS WITHOUT CONTRAST TECHNIQUE: Multidetector CT imaging of the abdomen and pelvis was performed following the standard protocol without IV contrast. COMPARISON:  CT the chest, abdomen and pelvis 04/09/2020. FINDINGS: Lower chest: Cardiomegaly. Atherosclerotic calcifications in the left main, left anterior descending, left circumflex and right coronary arteries. Hepatobiliary: Calcified granuloma in the right lobe of the liver. No other definite suspicious cystic or solid hepatic lesions are confidently identified on today's noncontrast CT examination. Unenhanced appearance of the gallbladder is normal. Pancreas: No definite pancreatic mass or peripancreatic fluid collections or inflammatory changes are noted on today's noncontrast CT examination. Spleen: Unremarkable. Adrenals/Urinary Tract: There are no abnormal calcifications within the collecting system of either kidney, along the course of either ureter, or within the lumen of the urinary bladder. No hydroureteronephrosis or perinephric stranding to suggest urinary tract obstruction at this time. The unenhanced appearance of the kidneys is unremarkable bilaterally. Unenhanced appearance of the urinary bladder is normal. Bilateral adrenal glands are normal in appearance. Stomach/Bowel: In the antral pre-pyloric region of the stomach there is a masslike area estimated to measure approximately 6.3 x 5.6 x 5.6 cm (axial image 31 of series 3 and sagittal image 98 of series 7). No pathologic dilatation of small bowel or colon. Normal appendix. Vascular/Lymphatic: Aortic atherosclerosis. No lymphadenopathy noted in the abdomen or pelvis. Reproductive: Prostate gland and seminal vesicles are  unremarkable in appearance. Other: No significant volume of ascites.  No pneumoperitoneum. Musculoskeletal: There are no aggressive appearing lytic  or blastic lesions noted in the visualized portions of the skeleton. IMPRESSION: 1. No definite acute findings are noted in the abdomen or pelvis to account for the patient's symptoms. 2. However, there is a mass-like area in the antral pre-pyloric region of the stomach. This may simply represent ingested food, however, the possibility of a primary gastric neoplasm warrants consideration. Further evaluation with upper GI or endoscopy should be considered in the near future to exclude the possibility of malignancy. 3. Aortic atherosclerosis, in addition to left main and 3 vessel coronary artery disease. Please note that although the presence of coronary artery calcium documents the presence of coronary artery disease, the severity of this disease and any potential stenosis cannot be assessed on this non-gated CT examination. Assessment for potential risk factor modification, dietary therapy or pharmacologic therapy may be warranted, if clinically indicated. 4. Cardiomegaly. Electronically Signed   By: Vinnie Langton M.D.   On: 11/01/2020 12:47   DG Chest 2 View  Result Date: 10/31/2020 CLINICAL DATA:  Chest pain, shortness of breath EXAM: CHEST - 2 VIEW COMPARISON:  Chest radiograph dated 10/08/2020. FINDINGS: The heart is enlarged. The lungs are clear and there is no pleural effusion or pneumothorax. The osseous structures are intact. IMPRESSION: Cardiomegaly without acute pulmonary disease. Electronically Signed   By: Zerita Boers M.D.   On: 10/31/2020 14:57      Medications:     Current Medications: . aspirin EC  81 mg Oral Daily  . atorvastatin  80 mg Oral Daily  . digoxin  0.125 mg Oral Daily  . empagliflozin  10 mg Oral Daily  . enoxaparin (LOVENOX) injection  40 mg Subcutaneous Q24H  . isosorbide-hydrALAZINE  1 tablet Oral TID  . multivitamin with minerals  1 tablet Oral Daily  . pantoprazole  40 mg Oral Daily  . potassium chloride SA  20 mEq Oral Daily  . sodium chloride flush  3 mL  Intravenous Q12H  . torsemide  80 mg Oral BID     Infusions: . sodium chloride       Assessment/Plan   1. Acute on Chronic Systolic HF: Echo in 3220 with EF 40-45%, thought to be ischemic cardiomyopathy. Cath in 10/19 showed 90% ostial OM1 and 70% PLV, no interventional target. Suspect mixed ischemic/nonischemic cardiomyopathy. Recent Echo 10/21 showed EF 20-25% with mild RV dysfunction.He has a strong family history of CHF/cardiomyopathy, so may be a component of familial cardiomyopathy.Cocaine likely plays a role in his cardiomyopathy.Multiple re-admits forcardiogenic shockin the last 6 months requiring milrinone. Unfortunately, not a candidate for advanced therapies or home milrinone with active use of cocaine and current social situation homeless (SW following).  - he is fluid overloaded on exam. Plan IV Lasix 80 mg bid. Reassess fluid status in the Am.  - suspect he will need higher dose of PO torsemide once he returns home to help w/ volume status. Can increase home torsemide to 100 mg daily after diuresis w/ IV Lasix.   - in the past, medical therapy has been limited by soft BP but appears to be tolerating Bidil ok  - Continue Bidil 1 tablet BID (monitor BP)  - Continue digoxin 0.125. Dig level ok 0.2  - Continue Jardiance 10 mg daily  -  BP too soft for ARNi  -  Can try low dose spiro 12.5 mg bid  -  no  blocker  given h/o low output  -  Palliative care consult recommended, but pt adamantly declines.   2.  Mitral Regurgitation - moderate to severe on recent echo, suspect functional MR from dilated CM   3 CAD: H/o anterior STEMI with DES to LAD in 7/15. Cath in 10/19 with 90% ostial OM1 and 70% PLV, no interventional target.  - no recent CP  -ContinueASA 81 and atorvastatin 80 mg daily - needs to refrain from using cocaine  4. Stage III CKD  - Baseline SCr  1.5 c/w baseline - follow BMP  w/ diuresis   5. Abdominal Pain/ Abnormal Abdominal CT - suspect abdominal  pain be be 2/2 low output HF, but CT shows a mass-like area in the antral pre-pyloric region of the stomach. This may simply represent ingested food, however, cannot r/o the possibility of a primary gastric neoplasm.  - lipase WNL at 43 U/L. HFTs WNL - given severe HF w/ limited options and overall poor prognosis, would refrain from further w/u  6. Ongoing Cocaine abuse: - UDS +  - abstinence encouraged   7. Recent COVID Infection  - tested + 09/19/20  Length of Stay: 0  Lyda Jester, PA-C  11/01/2020, 12:49 PM  Advanced Heart Failure Team Pager [phone] (M-F; 7a - 4p)  Please contact [facility] for night-coverage after hours (4p -7a ) and weekends on [email or web address]  Patient seen with PA, agree with the above note.   Patient returns with volume overload => dyspnea and weight gain.  Has been taking torsemide 80 mg daily at home.  He has epigastric pain, possibly related to low output HF.  Mildly elevated HS-TnI without trend.   General: NAD Neck: JVP 14 cm, no thyromegaly or thyroid nodule.  Lungs: Clear to auscultation bilaterally with normal respiratory effort. CV: Lateral PMI.  Heart regular S1/S2, no S3/S4, 2/6 HSM apex.  Trace ankle edema.  No carotid bruit.  Normal pedal pulses.  Abdomen: Soft, nontender, no hepatosplenomegaly, no distention.  Skin: Intact without lesions or rashes.  Neurologic: Alert and oriented x 3.  Psych: Normal affect. Extremities: No clubbing or cyanosis.  HEENT: Normal.   1. Acute on chronic systolic CHF: Mixed ischemic/nonischemic (cocaine) cardiomyopathy.  Most recent echo in 10/21 with E 20-25%, mild RV dysfunction, moderate-severe MR.  Has been demonstrated to have low output HF and has been on milrinone in the hospital in the past.  Not a candidate for transplant, LVAD, or home milrinone with ongoing substance abuse.  He is positive for cocaine again this admission. Additionally, he is homeless.  On exam, he is volume overloaded with NYHA  class IIIb symptoms.  He says that Lasix 80 mg IV boluses "are not enough" and he supplements with his own supply of torsemide which he has brought with him.   - I discouraged patient from mixing diuretics.  I told him that we would start him on a continuous Lasix infusion at 10 mg/hr but not to take his home torsemide.  - Continue home digoxin, level ok.  - Continue Jardiance 10 mg daily.  - Continue Bidil 1 tab tid as long as BP tolerates.  - Can add spironolactone if creatinine/K remain stable.  - BP too low for Entresto.  - As above, not candidate for advanced therapies or home milrinone.  Will try to diurese him without milrinone, can add if needed but cannot go home with it.  - He has refused palliative care.  2. CAD: No chest pain, HS-TnI mildly elevated  with no trend.  Suspect demand ischemia from volume overload.  - Continue ASA and statin.  3. CKD stage 3: Cardiorenal syndrome.  Follow closely with diuresis.  4. Mitral regurgitation: Moderate-severe functional MR.  5. Cocaine abuse: Ongoing.   Loralie Champagne 11/01/2020 4:16 PM

## 2020-11-01 NOTE — Telephone Encounter (Signed)
CSW received call from pt who is currently in the ED.  Pt states he thinks they were going to send him home today and just wanted to make sure he knew medication changes that were recommended for his torsemide last clinic appt.  CSW informed pt of notes recommendation for 100mg  for two days then back to 80mg - pt expressed understanding.  CSW inquired about pt efforts to go to rehab.  Pt states he has continued to call [facility] every morning to remain on waitlist but also followed up with [facility] and has been set up with intake for Monday at 12pm- states that they will be providing transport for him.  Pt reports no further needs at this time.  [name], LCSW Clinical Social Worker Advanced Heart Failure Clinic Desk#: [phone] Cell#: [phone]

## 2020-11-01 NOTE — Progress Notes (Signed)
PROGRESS NOTE    Tyler Wong  JME:[id number] DOB: Jun 29, 1966 DOA: 10/31/2020 PCP: [name], NP   Chief Complaint  Patient presents with  . Chest Pain  . Shortness of Breath    Brief Narrative: 54 y.o. male with medical history significant of advanced chronic systolic CHF with LVEF 20 to 25%, cocaine abuse, CKD stage II, presented with worsening of fluid retention. Patient been taking 80 mg torsemide twice daily for chronic systolic CHF. On and off, he noticed fluid retention around his ankles, and " feeling congested in my stomach, very easy to get food after eating any food", as educated by CHF team, has been taking extra dose of torsemide but he started to feel congestion and usually prevent fluid retention to become worse. However in the last 2 days, despite increasing his torsemide from 80 mg twice daily to 100 mg twice daily, I do not see increased urine output and meantime he started to feel increasing shortness of breath and again "bloated stomach". He claimed that he still use cocaine and the last time he used it was more than 2 weeks ago. And he also claims he has been compliant with fluid intake of less than 2000 mL/day and watch sodium intake. He denied any chest pain, no cough, no diarrhea, no fever or chills.  ED Course: X-ray shows cardiomegaly but no significant pulmonary congestion, troponin 220>190.  Subjective: Alert, awake Patient was feeling better and was voiding well.  Again he started to have bloating and abdominal discomfort after eating.  He was reporting shortness of breath due to abdominal distention.   Assessment & Plan:  Abdominal bloating:suspected from CHF, lipase LFTs are stable.  Will obtain CT abdomen pelvis to rule out other etiology.  Abdomen no acute finding, masslike area in the antral prepyloric region of the stomach which may simply represent ingested food however cannot rule out possibility of primary gastric neoplasm-have requested GI  consult [facility].  Acute on chronic systolic CHF/mitral regurgitation moderate to severe in recent echo-likely from dilated CM: CHF exacerbation in the setting of noncompliance, substance abuse.  Noted 1.5 kg weight gain compared to his last discharge. Continue diuretics, monitor intake output.  Resume home medication including Bidil. Cardiology has been consulted.  Elevated troponin flat in the setting of CHF,cocaine use.  Did not have chest pain this morning.  Continue telemetry  Cocaine abuse patient denied using cocaine recently but was more than a month ago.  Urine drug screen noted.  CKD stage IIIa: creatinine holding stable 1.5-1.6  Recent Covid infection 09/19/2020  Nutrition: Diet Order            Diet Heart Room service appropriate? Yes; Fluid consistency: Thin; Fluid restriction: 1800 mL Fluid  Diet effective now                 Body mass index is 23.48 kg/m.  DVT prophylaxis: enoxaparin (LOVENOX) injection 40 mg Start: 10/31/20 2030 Code Status:   Code Status: Full Code  Family Communication: plan of care discussed with patient at bedside.  Status is: Admitted as observation  Patient remains to be hospitalized due to ongoing tachypnea tachycardia shortness of breath abdomen bloating.  Continue diuresis, obtain further evaluation with CT abdomen pelvis, cardiology consultation.  Dispo: The patient is from: Home              Anticipated d/c is to: Home              Anticipated d/c date  is: 1 day              Patient currently is not medically stable to d/c. Consultants:see note  Procedures:see note  Culture/Microbiology    Component Value Date/Time   SDES BLOOD LEFT ARM 10/08/2020 1520   SPECREQUEST  10/08/2020 1520    BOTTLES DRAWN AEROBIC AND ANAEROBIC Blood Culture adequate volume   CULT (A) 10/08/2020 1520    BACILLUS SPECIES Standardized susceptibility testing for this organism is not available. Performed at [facility], [place] [address],  [place], [place] [address]    REPTSTATUS 10/11/2020 FINAL 10/08/2020 1520    Medications: Scheduled Meds: . aspirin EC  81 mg Oral Daily  . atorvastatin  80 mg Oral Daily  . digoxin  0.125 mg Oral Daily  . empagliflozin  10 mg Oral Daily  . enoxaparin (LOVENOX) injection  40 mg Subcutaneous Q24H  . isosorbide-hydrALAZINE  1 tablet Oral TID  . multivitamin with minerals  1 tablet Oral Daily  . pantoprazole  40 mg Oral Daily  . potassium chloride SA  20 mEq Oral Daily  . sodium chloride flush  3 mL Intravenous Q12H  . torsemide  80 mg Oral BID   Continuous Infusions: . sodium chloride      Antimicrobials: Anti-infectives (From admission, onward)   None     Objective: Vitals: Today's Vitals   11/01/20 1100 11/01/20 1130 11/01/20 1200 11/01/20 1315  BP: 109/89 116/81 (!) 99/52 99/81  Pulse: (!) 55 (!) 115 (!) 108 100  Resp: 18 (!) 29 19 (!) 23  Temp:      TempSrc:      SpO2: 99% 100% 99% 100%  Weight:      Height:      PainSc:        Intake/Output Summary (Last 24 hours) at 11/01/2020 1405 Last data filed at 10/31/2020 2236 Gross per 24 hour  Intake --  Output 400 ml  Net -400 ml   Filed Weights   10/31/20 1440  Weight: 75.3 kg   Weight change:   Intake/Output from previous day: 11/04 0701 - 11/05 0700 In: -  Out: 400 [Urine:400] Intake/Output this shift: No intake/output data recorded.  Examination: General exam: AAOx3 ,NAD, weak appearing. HEENT:Oral mucosa moist, Ear/Nose WNL grossly,dentition normal. Respiratory system: bilaterally diminished breath sounds mild basal crackles,no wheezing or crackles,no use of accessory muscle, non tender. Cardiovascular system: S1 & S2 +, regular, No JVD. Gastrointestinal system: Abdomen soft, NT,ND, BS+. Nervous System:Alert, awake, moving extremities and grossly nonfocal Extremities: No edema, distal peripheral pulses palpable.  Skin: No rashes,no icterus. MSK: Normal muscle bulk,tone, power  Data Reviewed: I have  personally reviewed following labs and imaging studies CBC: Recent Labs  Lab 10/27/20 2221 10/31/20 1451  WBC 9.2 7.7  NEUTROABS 6.5  --   HGB 12.8* 14.3  HCT 38.7* 45.6  MCV 87.2 91.6  PLT 290 974   Basic Metabolic Panel: Recent Labs  Lab 10/27/20 2221 10/31/20 1451 11/01/20 0205  NA 140 137 134*  K 3.6 3.5 3.8  CL 99 94* 96*  CO2 27 29 25   GLUCOSE 158* 123* 128*  BUN 45* 31* 35*  CREATININE 1.80* 1.64* 1.58*  CALCIUM 8.9 9.2 9.0   GFR: Estimated Creatinine Clearance: 56.1 mL/min (A) (by C-G formula based on SCr of 1.58 mg/dL (H)). Liver Function Tests: Recent Labs  Lab 10/27/20 2221 10/31/20 1604  AST 35 31  ALT 36 32  ALKPHOS 116 113  BILITOT 0.8 1.5*  PROT 7.1 7.2  ALBUMIN 3.4* 3.3*   Recent Labs  Lab 10/31/20 1604  LIPASE 43   No results for input(s): AMMONIA in the last 168 hours. Coagulation Profile: No results for input(s): INR, PROTIME in the last 168 hours. Cardiac Enzymes: No results for input(s): CKTOTAL, CKMB, CKMBINDEX, TROPONINI in the last 168 hours. BNP (last 3 results) Recent Labs    01/30/20 1645  PROBNP 1,759*   HbA1C: No results for input(s): HGBA1C in the last 72 hours. CBG: No results for input(s): GLUCAP in the last 168 hours. Lipid Profile: No results for input(s): CHOL, HDL, LDLCALC, TRIG, CHOLHDL, LDLDIRECT in the last 72 hours. Thyroid Function Tests: No results for input(s): TSH, T4TOTAL, FREET4, T3FREE, THYROIDAB in the last 72 hours. Anemia Panel: No results for input(s): VITAMINB12, FOLATE, FERRITIN, TIBC, IRON, RETICCTPCT in the last 72 hours. Sepsis Labs: No results for input(s): PROCALCITON, LATICACIDVEN in the last 168 hours.  Recent Results (from the past 240 hour(s))  Respiratory Panel by RT PCR (Flu A&B, Covid) - Nasopharyngeal Swab     Status: None   Collection Time: 10/28/20  4:30 AM   Specimen: Nasopharyngeal Swab  Result Value Ref Range Status   SARS Coronavirus 2 by RT PCR NEGATIVE NEGATIVE Final      Comment: (NOTE) SARS-CoV-2 target nucleic acids are NOT DETECTED.  The SARS-CoV-2 RNA is generally detectable in upper respiratoy specimens during the acute phase of infection. The lowest concentration of SARS-CoV-2 viral copies this assay can detect is 131 copies/mL. A negative result does not preclude SARS-Cov-2 infection and should not be used as the sole basis for treatment or other patient management decisions. A negative result may occur with  improper specimen collection/handling, submission of specimen other than nasopharyngeal swab, presence of viral mutation(s) within the areas targeted by this assay, and inadequate number of viral copies (<131 copies/mL). A negative result must be combined with clinical observations, patient history, and epidemiological information. The expected result is Negative.  Fact Sheet for Patients:  PinkCheek.be  Fact Sheet for Healthcare Providers:  GravelBags.it  This test is no t yet approved or cleared by the [place] FDA and  has been authorized for detection and/or diagnosis of SARS-CoV-2 by FDA under an Emergency Use Authorization (EUA). This EUA will remain  in effect (meaning this test can be used) for the duration of the COVID-19 declaration under Section 564(b)(1) of the Act, 21 U.S.C. section 360bbb-3(b)(1), unless the authorization is terminated or revoked sooner.     Influenza A by PCR NEGATIVE NEGATIVE Final   Influenza B by PCR NEGATIVE NEGATIVE Final    Comment: (NOTE) The Xpert Xpress SARS-CoV-2/FLU/RSV assay is intended as an aid in  the diagnosis of influenza from Nasopharyngeal swab specimens and  should not be used as a sole basis for treatment. Nasal washings and  aspirates are unacceptable for Xpert Xpress SARS-CoV-2/FLU/RSV  testing.  Fact Sheet for Patients: PinkCheek.be  Fact Sheet for Healthcare  Providers: GravelBags.it  This test is not yet approved or cleared by the [place] FDA and  has been authorized for detection and/or diagnosis of SARS-CoV-2 by  FDA under an Emergency Use Authorization (EUA). This EUA will remain  in effect (meaning this test can be used) for the duration of the  Covid-19 declaration under Section 564(b)(1) of the Act, 21  U.S.C. section 360bbb-3(b)(1), unless the authorization is  terminated or revoked. Performed at [facility], [place] [address], [place], [place] [address]  Radiology Studies: CT ABDOMEN PELVIS WO CONTRAST  Result Date: 11/01/2020 CLINICAL DATA:  54 year old male with history of epigastric abdominal pain for the past few days. EXAM: CT ABDOMEN AND PELVIS WITHOUT CONTRAST TECHNIQUE: Multidetector CT imaging of the abdomen and pelvis was performed following the standard protocol without IV contrast. COMPARISON:  CT the chest, abdomen and pelvis 04/09/2020. FINDINGS: Lower chest: Cardiomegaly. Atherosclerotic calcifications in the left main, left anterior descending, left circumflex and right coronary arteries. Hepatobiliary: Calcified granuloma in the right lobe of the liver. No other definite suspicious cystic or solid hepatic lesions are confidently identified on today's noncontrast CT examination. Unenhanced appearance of the gallbladder is normal. Pancreas: No definite pancreatic mass or peripancreatic fluid collections or inflammatory changes are noted on today's noncontrast CT examination. Spleen: Unremarkable. Adrenals/Urinary Tract: There are no abnormal calcifications within the collecting system of either kidney, along the course of either ureter, or within the lumen of the urinary bladder. No hydroureteronephrosis or perinephric stranding to suggest urinary tract obstruction at this time. The unenhanced appearance of the kidneys is unremarkable bilaterally. Unenhanced appearance of the  urinary bladder is normal. Bilateral adrenal glands are normal in appearance. Stomach/Bowel: In the antral pre-pyloric region of the stomach there is a masslike area estimated to measure approximately 6.3 x 5.6 x 5.6 cm (axial image 31 of series 3 and sagittal image 98 of series 7). No pathologic dilatation of small bowel or colon. Normal appendix. Vascular/Lymphatic: Aortic atherosclerosis. No lymphadenopathy noted in the abdomen or pelvis. Reproductive: Prostate gland and seminal vesicles are unremarkable in appearance. Other: No significant volume of ascites.  No pneumoperitoneum. Musculoskeletal: There are no aggressive appearing lytic or blastic lesions noted in the visualized portions of the skeleton. IMPRESSION: 1. No definite acute findings are noted in the abdomen or pelvis to account for the patient's symptoms. 2. However, there is a mass-like area in the antral pre-pyloric region of the stomach. This may simply represent ingested food, however, the possibility of a primary gastric neoplasm warrants consideration. Further evaluation with upper GI or endoscopy should be considered in the near future to exclude the possibility of malignancy. 3. Aortic atherosclerosis, in addition to left main and 3 vessel coronary artery disease. Please note that although the presence of coronary artery calcium documents the presence of coronary artery disease, the severity of this disease and any potential stenosis cannot be assessed on this non-gated CT examination. Assessment for potential risk factor modification, dietary therapy or pharmacologic therapy may be warranted, if clinically indicated. 4. Cardiomegaly. Electronically Signed   By: Vinnie Langton M.D.   On: 11/01/2020 12:47   DG Chest 2 View  Result Date: 10/31/2020 CLINICAL DATA:  Chest pain, shortness of breath EXAM: CHEST - 2 VIEW COMPARISON:  Chest radiograph dated 10/08/2020. FINDINGS: The heart is enlarged. The lungs are clear and there is no pleural  effusion or pneumothorax. The osseous structures are intact. IMPRESSION: Cardiomegaly without acute pulmonary disease. Electronically Signed   By: Zerita Boers M.D.   On: 10/31/2020 14:57     LOS: 0 days   Antonieta Pert, MD [facility]  11/01/2020, 2:05 PM

## 2020-11-01 NOTE — ED Notes (Signed)
Pt resting in bed with eyes closed, does not appear in distress. Respirations are even and non-labored  Skin is warm, dry and intact Call light within reach

## 2020-11-01 NOTE — ED Notes (Signed)
NT gave patient lunch bag with water.

## 2020-11-01 NOTE — Progress Notes (Signed)
   11/01/20 1500  Assess: MEWS Score  Temp 97.9 F (36.6 C)  BP 100/77  Pulse Rate (!) 102  ECG Heart Rate (!) 102  Resp 20  Level of Consciousness Alert  SpO2 98 %  O2 Device Room Air  Assess: MEWS Score  MEWS Temp 0  MEWS Systolic 1  MEWS Pulse 1  MEWS RR 0  MEWS LOC 0  MEWS Score 2  MEWS Score Color Yellow  Assess: if the MEWS score is Yellow or Red  Were vital signs taken at a resting state? Yes  Focused Assessment No change from prior assessment  Early Detection of Sepsis Score *See Row Information* Low  MEWS guidelines implemented *See Row Information* Yes  Treat  MEWS Interventions Administered scheduled meds/treatments  Pain Scale 0-10  Pain Score 0  Take Vital Signs  Increase Vital Sign Frequency  Yellow: Q 2hr X 2 then Q 4hr X 2, if remains yellow, continue Q 4hrs  Escalate  MEWS: Escalate Yellow: discuss with charge nurse/RN and consider discussing with provider and RRT  Notify: Charge Nurse/RN  Name of Charge Nurse/RN Notified Tonimarie Gritz, RN  Date Charge Nurse/RN Notified 11/01/20  Time Charge Nurse/RN Notified 1515  Document  Patient Outcome Other (Comment)  Progress note created (see row info) Yes

## 2020-11-01 NOTE — Telephone Encounter (Signed)
Entered in error

## 2020-11-02 ENCOUNTER — Inpatient Hospital Stay (HOSPITAL_COMMUNITY): Payer: Medicaid Other

## 2020-11-02 DIAGNOSIS — I5021 Acute systolic (congestive) heart failure: Secondary | ICD-10-CM

## 2020-11-02 LAB — COMPREHENSIVE METABOLIC PANEL
ALT: 30 U/L (ref 0–44)
AST: 29 U/L (ref 15–41)
Albumin: 2.9 g/dL — ABNORMAL LOW (ref 3.5–5.0)
Alkaline Phosphatase: 101 U/L (ref 38–126)
Anion gap: 12 (ref 5–15)
BUN: 36 mg/dL — ABNORMAL HIGH (ref 6–20)
CO2: 27 mmol/L (ref 22–32)
Calcium: 9 mg/dL (ref 8.9–10.3)
Chloride: 99 mmol/L (ref 98–111)
Creatinine, Ser: 1.92 mg/dL — ABNORMAL HIGH (ref 0.61–1.24)
GFR, Estimated: 41 mL/min — ABNORMAL LOW (ref >60)
Glucose, Bld: 121 mg/dL — ABNORMAL HIGH (ref 70–99)
Potassium: 3.8 mmol/L (ref 3.5–5.1)
Sodium: 138 mmol/L (ref 135–145)
Total Bilirubin: 1.2 mg/dL (ref 0.3–1.2)
Total Protein: 6.3 g/dL — ABNORMAL LOW (ref 6.5–8.1)

## 2020-11-02 LAB — CBC
HCT: 36.6 % — ABNORMAL LOW (ref 39.0–52.0)
Hemoglobin: 11.7 g/dL — ABNORMAL LOW (ref 13.0–17.0)
MCH: 27.8 pg (ref 26.0–34.0)
MCHC: 32 g/dL (ref 30.0–36.0)
MCV: 86.9 fL (ref 80.0–100.0)
Platelets: 214 10*3/uL (ref 150–400)
RBC: 4.21 MIL/uL — ABNORMAL LOW (ref 4.22–5.81)
RDW: 17.2 % — ABNORMAL HIGH (ref 11.5–15.5)
WBC: 8.7 10*3/uL (ref 4.0–10.5)
nRBC: 0 % (ref 0.0–0.2)

## 2020-11-02 NOTE — Progress Notes (Signed)
PROGRESS NOTE    Tyler Wong  JSE:[id number] DOB: 1966-04-29 DOA: 10/31/2020 PCP: [name], NP   Chief Complaint  Patient presents with  . Chest Pain  . Shortness of Breath    Brief Narrative: 54 y.o. male with medical history significant of advanced chronic systolic CHF with LVEF 20 to 25%, cocaine abuse, CKD stage II, presented with worsening of fluid retention. Patient been taking 80 mg torsemide twice daily for chronic systolic CHF. On and off, he noticed fluid retention around his ankles, and " feeling congested in my stomach, very easy to get food after eating any food", as educated by CHF team, has been taking extra dose of torsemide but he started to feel congestion and usually prevent fluid retention to become worse. However in the last 2 days, despite increasing his torsemide from 80 mg twice daily to 100 mg twice daily, I do not see increased urine output and meantime he started to feel increasing shortness of breath and again "bloated stomach". He claimed that he still use cocaine and the last time he used it was more than 2 weeks ago. And he also claims he has been compliant with fluid intake of less than 2000 mL/day and watch sodium intake. He denied any chest pain, no cough, no diarrhea, no fever or chills.  ED Course: X-ray shows cardiomegaly but no significant pulmonary congestion, troponin 220>190. Patient is admitted, seen by cardiology given his fluid overload placed on IV Lasix drip.  Subjective:  Resting on the edge of the bed.  Shortness of breath improving.  Intermittent tachycardia present. Overnight, creatinine slightly uptrending. Abdominal bloating improving.   Assessment & Plan:  Acute on chronic systolic CHF/mitral regurgitation moderate to severe in recent echo-likely from dilated CM: CHF exacerbation in the setting of noncompliance, substance abuse.  Appreciate cardiology input, patient is not a candidate for advanced therapy.  Palliative care  approach has been recommended by cardiology but patient has adamantly refused.  Continue Lasix drip but decreasing due to elevated creatinine as per cardiology continue BiDil.  If he remains fluid overloaded despite diuretics and creatinine continues to climb he may need limited course of milrinone.  Abdominal bloating: Improving.  Suspecting from his low EF.  Lipase LFTs normal, underwent CT abdomen pelvis that showed masslike area in the antral prepyloric region of the stomach, simply may represent ingested food but the possibility of primary gastric neoplasm warrants consideration set by the upper GI endoscopy but given his CHF cardiology advised no further invasive work-up, discussed with [facility] Dr. Dolan Amen reviewed his previous CT abdomen pelvis with CT contrast on 04/09/2020 with no such finding, advised  Upper GI barium to eval.  Elevated troponin flat in the setting of CHF,cocaine use.  Did not have chest pain this morning.  Continue telemetry  Cocaine abuse patient denied using cocaine recently but was more than a month ago.  Urine drug screen noted.  AKI on CKD stage IIIa: creatinine holding stable 1.5-1.6.  Slightly up at 1.9, monitor closely while on diuresis unfortunately we may need to allow his creatinine slightly higher side given the need for diuresis  Recent Covid infection 09/19/2020  Nutrition: Diet Order            Diet Heart Room service appropriate? Yes; Fluid consistency: Thin; Fluid restriction: 1800 mL Fluid  Diet effective now                 Body mass index is 22.04 kg/m.  DVT prophylaxis:  enoxaparin (LOVENOX) injection 40 mg Start: 10/31/20 2030 Code Status:   Code Status: Full Code  Family Communication: plan of care discussed with patient at bedside.  Status is: Admitted as observation  Patient remains to be hospitalized due to ongoing tachypnea tachycardia shortness of breath abdomen bloating.  Continue diuresis, obtain further evaluation with CT  abdomen pelvis, cardiology consultation.  Dispo: The patient is from: Home              Anticipated d/c is to: Home              Anticipated d/c date is: 1-2 days              Patient currently is not medically stable to d/c. Consultants:see note  Procedures:see note  Culture/Microbiology  Medications: Scheduled Meds: . aspirin EC  81 mg Oral Daily  . atorvastatin  80 mg Oral Daily  . digoxin  0.125 mg Oral Daily  . empagliflozin  10 mg Oral Daily  . enoxaparin (LOVENOX) injection  40 mg Subcutaneous Q24H  . isosorbide-hydrALAZINE  1 tablet Oral TID  . multivitamin with minerals  1 tablet Oral Daily  . pantoprazole  40 mg Oral Daily  . potassium chloride SA  20 mEq Oral Daily  . sodium chloride flush  3 mL Intravenous Q12H   Continuous Infusions: . sodium chloride    . furosemide (LASIX) 200 mg in dextrose 5% 100 mL (2mg /mL) infusion 8 mg/hr (11/02/20 1113)    Antimicrobials: Anti-infectives (From admission, onward)   None     Objective: Vitals: Today's Vitals   11/01/20 2347 11/02/20 0444 11/02/20 0719 11/02/20 0800  BP: 110/77 115/86 (!) 110/47   Pulse: (!) 108 (!) 102 99   Resp: 18 20 15    Temp: 98.7 F (37.1 C) 98.7 F (37.1 C) 98.5 F (36.9 C)   TempSrc: Oral Oral Oral   SpO2: 95% 100% 96%   Weight:  70.7 kg    Height:      PainSc:    0-No pain   No intake or output data in the 24 hours ending 11/02/20 1146 Filed Weights   10/31/20 1440 11/01/20 1525 11/02/20 0444  Weight: 75.3 kg 71.2 kg 70.7 kg   Weight change: -4.128 kg  Intake/Output from previous day: No intake/output data recorded. Intake/Output this shift: No intake/output data recorded.  Examination: General exam: AAOx3 , NAD, weak appearing. HEENT:Oral mucosa moist, Ear/Nose WNL grossly, dentition normal. Respiratory system: bilaterally clear,no use of accessory muscle Cardiovascular system: S1 & S2 +, apical systolic murmur present, no JVD,. Gastrointestinal system: Abdomen soft, NT,ND,  BS+ Nervous System:Alert, awake, moving extremities and grossly nonfocal Extremities: No edema, distal peripheral pulses palpable.  Skin: No rashes,no icterus. MSK: Normal muscle bulk,tone, power  Data Reviewed: I have personally reviewed following labs and imaging studies CBC: Recent Labs  Lab 10/27/20 2221 10/31/20 1451 11/02/20 0054  WBC 9.2 7.7 8.7  NEUTROABS 6.5  --   --   HGB 12.8* 14.3 11.7*  HCT 38.7* 45.6 36.6*  MCV 87.2 91.6 86.9  PLT 290 235 242   Basic Metabolic Panel: Recent Labs  Lab 10/27/20 2221 10/31/20 1451 11/01/20 0205 11/02/20 0054  NA 140 137 134* 138  K 3.6 3.5 3.8 3.8  CL 99 94* 96* 99  CO2 27 29 25 27   GLUCOSE 158* 123* 128* 121*  BUN 45* 31* 35* 36*  CREATININE 1.80* 1.64* 1.58* 1.92*  CALCIUM 8.9 9.2 9.0 9.0   GFR: Estimated Creatinine Clearance:  44 mL/min (A) (by C-G formula based on SCr of 1.92 mg/dL (H)). Liver Function Tests: Recent Labs  Lab 10/27/20 2221 10/31/20 1604 11/02/20 0054  AST 35 31 29  ALT 36 32 30  ALKPHOS 116 113 101  BILITOT 0.8 1.5* 1.2  PROT 7.1 7.2 6.3*  ALBUMIN 3.4* 3.3* 2.9*   Recent Labs  Lab 10/31/20 1604  LIPASE 43   No results for input(s): AMMONIA in the last 168 hours. Coagulation Profile: No results for input(s): INR, PROTIME in the last 168 hours. Cardiac Enzymes: No results for input(s): CKTOTAL, CKMB, CKMBINDEX, TROPONINI in the last 168 hours. BNP (last 3 results) Recent Labs    01/30/20 1645  PROBNP 1,759*   HbA1C: No results for input(s): HGBA1C in the last 72 hours. CBG: No results for input(s): GLUCAP in the last 168 hours. Lipid Profile: No results for input(s): CHOL, HDL, LDLCALC, TRIG, CHOLHDL, LDLDIRECT in the last 72 hours. Thyroid Function Tests: No results for input(s): TSH, T4TOTAL, FREET4, T3FREE, THYROIDAB in the last 72 hours. Anemia Panel: No results for input(s): VITAMINB12, FOLATE, FERRITIN, TIBC, IRON, RETICCTPCT in the last 72 hours. Sepsis Labs: No results  for input(s): PROCALCITON, LATICACIDVEN in the last 168 hours.  Recent Results (from the past 240 hour(s))  Respiratory Panel by RT PCR (Flu A&B, Covid) - Nasopharyngeal Swab     Status: None   Collection Time: 10/28/20  4:30 AM   Specimen: Nasopharyngeal Swab  Result Value Ref Range Status   SARS Coronavirus 2 by RT PCR NEGATIVE NEGATIVE Final    Comment: (NOTE) SARS-CoV-2 target nucleic acids are NOT DETECTED.  The SARS-CoV-2 RNA is generally detectable in upper respiratoy specimens during the acute phase of infection. The lowest concentration of SARS-CoV-2 viral copies this assay can detect is 131 copies/mL. A negative result does not preclude SARS-Cov-2 infection and should not be used as the sole basis for treatment or other patient management decisions. A negative result may occur with  improper specimen collection/handling, submission of specimen other than nasopharyngeal swab, presence of viral mutation(s) within the areas targeted by this assay, and inadequate number of viral copies (<131 copies/mL). A negative result must be combined with clinical observations, patient history, and epidemiological information. The expected result is Negative.  Fact Sheet for Patients:  PinkCheek.be  Fact Sheet for Healthcare Providers:  GravelBags.it  This test is no t yet approved or cleared by the [place] FDA and  has been authorized for detection and/or diagnosis of SARS-CoV-2 by FDA under an Emergency Use Authorization (EUA). This EUA will remain  in effect (meaning this test can be used) for the duration of the COVID-19 declaration under Section 564(b)(1) of the Act, 21 U.S.C. section 360bbb-3(b)(1), unless the authorization is terminated or revoked sooner.     Influenza A by PCR NEGATIVE NEGATIVE Final   Influenza B by PCR NEGATIVE NEGATIVE Final    Comment: (NOTE) The Xpert Xpress SARS-CoV-2/FLU/RSV assay is  intended as an aid in  the diagnosis of influenza from Nasopharyngeal swab specimens and  should not be used as a sole basis for treatment. Nasal washings and  aspirates are unacceptable for Xpert Xpress SARS-CoV-2/FLU/RSV  testing.  Fact Sheet for Patients: PinkCheek.be  Fact Sheet for Healthcare Providers: GravelBags.it  This test is not yet approved or cleared by the [place] FDA and  has been authorized for detection and/or diagnosis of SARS-CoV-2 by  FDA under an Emergency Use Authorization (EUA). This EUA will remain  in effect (  meaning this test can be used) for the duration of the  Covid-19 declaration under Section 564(b)(1) of the Act, 21  U.S.C. section 360bbb-3(b)(1), unless the authorization is  terminated or revoked. Performed at [facility], [place] [address] [place], [place] [address]      Radiology Studies: CT ABDOMEN PELVIS WO CONTRAST  Result Date: 11/01/2020 CLINICAL DATA:  54 year old male with history of epigastric abdominal pain for the past few days. EXAM: CT ABDOMEN AND PELVIS WITHOUT CONTRAST TECHNIQUE: Multidetector CT imaging of the abdomen and pelvis was performed following the standard protocol without IV contrast. COMPARISON:  CT the chest, abdomen and pelvis 04/09/2020. FINDINGS: Lower chest: Cardiomegaly. Atherosclerotic calcifications in the left main, left anterior descending, left circumflex and right coronary arteries. Hepatobiliary: Calcified granuloma in the right lobe of the liver. No other definite suspicious cystic or solid hepatic lesions are confidently identified on today's noncontrast CT examination. Unenhanced appearance of the gallbladder is normal. Pancreas: No definite pancreatic mass or peripancreatic fluid collections or inflammatory changes are noted on today's noncontrast CT examination. Spleen: Unremarkable. Adrenals/Urinary Tract: There are no abnormal  calcifications within the collecting system of either kidney, along the course of either ureter, or within the lumen of the urinary bladder. No hydroureteronephrosis or perinephric stranding to suggest urinary tract obstruction at this time. The unenhanced appearance of the kidneys is unremarkable bilaterally. Unenhanced appearance of the urinary bladder is normal. Bilateral adrenal glands are normal in appearance. Stomach/Bowel: In the antral pre-pyloric region of the stomach there is a masslike area estimated to measure approximately 6.3 x 5.6 x 5.6 cm (axial image 31 of series 3 and sagittal image 98 of series 7). No pathologic dilatation of small bowel or colon. Normal appendix. Vascular/Lymphatic: Aortic atherosclerosis. No lymphadenopathy noted in the abdomen or pelvis. Reproductive: Prostate gland and seminal vesicles are unremarkable in appearance. Other: No significant volume of ascites.  No pneumoperitoneum. Musculoskeletal: There are no aggressive appearing lytic or blastic lesions noted in the visualized portions of the skeleton. IMPRESSION: 1. No definite acute findings are noted in the abdomen or pelvis to account for the patient's symptoms. 2. However, there is a mass-like area in the antral pre-pyloric region of the stomach. This may simply represent ingested food, however, the possibility of a primary gastric neoplasm warrants consideration. Further evaluation with upper GI or endoscopy should be considered in the near future to exclude the possibility of malignancy. 3. Aortic atherosclerosis, in addition to left main and 3 vessel coronary artery disease. Please note that although the presence of coronary artery calcium documents the presence of coronary artery disease, the severity of this disease and any potential stenosis cannot be assessed on this non-gated CT examination. Assessment for potential risk factor modification, dietary therapy or pharmacologic therapy may be warranted, if clinically  indicated. 4. Cardiomegaly. Electronically Signed   By: Vinnie Langton M.D.   On: 11/01/2020 12:47   DG Chest 2 View  Result Date: 10/31/2020 CLINICAL DATA:  Chest pain, shortness of breath EXAM: CHEST - 2 VIEW COMPARISON:  Chest radiograph dated 10/08/2020. FINDINGS: The heart is enlarged. The lungs are clear and there is no pleural effusion or pneumothorax. The osseous structures are intact. IMPRESSION: Cardiomegaly without acute pulmonary disease. Electronically Signed   By: Zerita Boers M.D.   On: 10/31/2020 14:57     LOS: 1 day   Antonieta Pert, MD [facility]  11/02/2020, 11:46 AM

## 2020-11-02 NOTE — Progress Notes (Signed)
Progress Note  Patient Name: Tyler Wong Date of Encounter: 11/02/2020  Primary Cardiologist: Shelva Majestic, MD  Advanced Heart Failure Provider: Loralie Champagne, MD  Subjective   Patient lying supine, no breathlessness at rest.  Still has some abdominal discomfort but leg swelling is better.  No chest pain.  Inpatient Medications    Scheduled Meds: . aspirin EC  81 mg Oral Daily  . atorvastatin  80 mg Oral Daily  . digoxin  0.125 mg Oral Daily  . empagliflozin  10 mg Oral Daily  . enoxaparin (LOVENOX) injection  40 mg Subcutaneous Q24H  . isosorbide-hydrALAZINE  1 tablet Oral TID  . multivitamin with minerals  1 tablet Oral Daily  . pantoprazole  40 mg Oral Daily  . potassium chloride SA  20 mEq Oral Daily  . sodium chloride flush  3 mL Intravenous Q12H   Continuous Infusions: . sodium chloride    . furosemide (LASIX) 200 mg in dextrose 5% 100 mL (2mg /mL) infusion 10 mg/hr (11/01/20 1833)   PRN Meds: sodium chloride, acetaminophen, ipratropium, ondansetron (ZOFRAN) IV, sodium chloride flush   Vital Signs    Vitals:   11/01/20 2115 11/01/20 2347 11/02/20 0444 11/02/20 0719  BP:  110/77 115/86 (!) 110/47  Pulse: (!) 113 (!) 108 (!) 102 99  Resp: 18 18 20 15   Temp:  98.7 F (37.1 C) 98.7 F (37.1 C) 98.5 F (36.9 C)  TempSrc:  Oral Oral Oral  SpO2:  95% 100% 96%  Weight:   70.7 kg   Height:       No intake or output data in the 24 hours ending 11/02/20 1003 Filed Weights   10/31/20 1440 11/01/20 1525 11/02/20 0444  Weight: 75.3 kg 71.2 kg 70.7 kg    Telemetry    Sinus rhythm with burst of NSVT.  Personally reviewed.  ECG    An ECG dated 11/01/2020 was personally reviewed today and demonstrated:  Sinus tachycardia with biatrial enlargement, rightward axis, rule out old inferior infarct pattern, nonspecific ST changes.  Physical Exam   GEN: No acute distress.   Neck:  Elevated JVP. Cardiac: RRR, 2/6 apical systolic no gallop. Respiratory: Nonlabored.  Clear to auscultation bilaterally. GI: Soft, nontender, bowel sounds present. MS: No edema; No deformity.  Labs    Chemistry Recent Labs  Lab 10/27/20 2221 10/27/20 2221 10/31/20 1451 10/31/20 1604 11/01/20 0205 11/02/20 0054  NA 140   < > 137  --  134* 138  K 3.6   < > 3.5  --  3.8 3.8  CL 99   < > 94*  --  96* 99  CO2 27   < > 29  --  25 27  GLUCOSE 158*   < > 123*  --  128* 121*  BUN 45*   < > 31*  --  35* 36*  CREATININE 1.80*   < > 1.64*  --  1.58* 1.92*  CALCIUM 8.9   < > 9.2  --  9.0 9.0  PROT 7.1  --   --  7.2  --  6.3*  ALBUMIN 3.4*  --   --  3.3*  --  2.9*  AST 35  --   --  31  --  29  ALT 36  --   --  32  --  30  ALKPHOS 116  --   --  113  --  101  BILITOT 0.8  --   --  1.5*  --  1.2  GFRNONAA  44*   < > 49*  --  52* 41*  ANIONGAP 14   < > 14  --  13 12   < > = values in this interval not displayed.     Hematology Recent Labs  Lab 10/27/20 2221 10/31/20 1451 11/02/20 0054  WBC 9.2 7.7 8.7  RBC 4.44 4.98 4.21*  HGB 12.8* 14.3 11.7*  HCT 38.7* 45.6 36.6*  MCV 87.2 91.6 86.9  MCH 28.8 28.7 27.8  MCHC 33.1 31.4 32.0  RDW 17.5* 17.7* 17.2*  PLT 290 235 214    Cardiac Enzymes Recent Labs  Lab 10/03/20 1853 10/31/20 1451 10/31/20 1642  TROPONINIHS 185* 227* 190*    BNP Recent Labs  Lab 10/31/20 1604  BNP 1,322.2*     Radiology    CT ABDOMEN PELVIS WO CONTRAST  Result Date: 11/01/2020 CLINICAL DATA:  54 year old male with history of epigastric abdominal pain for the past few days. EXAM: CT ABDOMEN AND PELVIS WITHOUT CONTRAST TECHNIQUE: Multidetector CT imaging of the abdomen and pelvis was performed following the standard protocol without IV contrast. COMPARISON:  CT the chest, abdomen and pelvis 04/09/2020. FINDINGS: Lower chest: Cardiomegaly. Atherosclerotic calcifications in the left main, left anterior descending, left circumflex and right coronary arteries. Hepatobiliary: Calcified granuloma in the right lobe of the liver. No other  definite suspicious cystic or solid hepatic lesions are confidently identified on today's noncontrast CT examination. Unenhanced appearance of the gallbladder is normal. Pancreas: No definite pancreatic mass or peripancreatic fluid collections or inflammatory changes are noted on today's noncontrast CT examination. Spleen: Unremarkable. Adrenals/Urinary Tract: There are no abnormal calcifications within the collecting system of either kidney, along the course of either ureter, or within the lumen of the urinary bladder. No hydroureteronephrosis or perinephric stranding to suggest urinary tract obstruction at this time. The unenhanced appearance of the kidneys is unremarkable bilaterally. Unenhanced appearance of the urinary bladder is normal. Bilateral adrenal glands are normal in appearance. Stomach/Bowel: In the antral pre-pyloric region of the stomach there is a masslike area estimated to measure approximately 6.3 x 5.6 x 5.6 cm (axial image 31 of series 3 and sagittal image 98 of series 7). No pathologic dilatation of small bowel or colon. Normal appendix. Vascular/Lymphatic: Aortic atherosclerosis. No lymphadenopathy noted in the abdomen or pelvis. Reproductive: Prostate gland and seminal vesicles are unremarkable in appearance. Other: No significant volume of ascites.  No pneumoperitoneum. Musculoskeletal: There are no aggressive appearing lytic or blastic lesions noted in the visualized portions of the skeleton. IMPRESSION: 1. No definite acute findings are noted in the abdomen or pelvis to account for the patient's symptoms. 2. However, there is a mass-like area in the antral pre-pyloric region of the stomach. This may simply represent ingested food, however, the possibility of a primary gastric neoplasm warrants consideration. Further evaluation with upper GI or endoscopy should be considered in the near future to exclude the possibility of malignancy. 3. Aortic atherosclerosis, in addition to left main and  3 vessel coronary artery disease. Please note that although the presence of coronary artery calcium documents the presence of coronary artery disease, the severity of this disease and any potential stenosis cannot be assessed on this non-gated CT examination. Assessment for potential risk factor modification, dietary therapy or pharmacologic therapy may be warranted, if clinically indicated. 4. Cardiomegaly. Electronically Signed   By: Vinnie Langton M.D.   On: 11/01/2020 12:47   DG Chest 2 View  Result Date: 10/31/2020 CLINICAL DATA:  Chest pain, shortness of breath  EXAM: CHEST - 2 VIEW COMPARISON:  Chest radiograph dated 10/08/2020. FINDINGS: The heart is enlarged. The lungs are clear and there is no pleural effusion or pneumothorax. The osseous structures are intact. IMPRESSION: Cardiomegaly without acute pulmonary disease. Electronically Signed   By: Zerita Boers M.D.   On: 10/31/2020 14:57    Cardiac Studies   Echocardiogram 10/04/2020: 1. 3 D images of the LV were obtained. . Left ventricular ejection  fraction, by estimation, is 20 to 25%. The left ventricle has severely  decreased function. The left ventricle demonstrates global hypokinesis.  The left ventricular internal cavity size  was moderately dilated. There is mild left ventricular hypertrophy. Left  ventricular diastolic parameters are consistent with Grade II diastolic  dysfunction (pseudonormalization).  2. Right ventricular systolic function is low normal. The right  ventricular size is normal. There is moderately elevated pulmonary artery  systolic pressure. The estimated right ventricular systolic pressure is  10.9 mmHg.  3. Left atrial size was moderately dilated.  4. Right atrial size was severely dilated.  5. The mitral valve is grossly normal. Moderate to severe mitral valve  regurgitation. Mild mitral stenosis.  6. Tricuspid valve regurgitation is moderate to severe.  7. The aortic valve is normal in  structure. Aortic valve regurgitation is  not visualized. No aortic stenosis is present.   Patient Profile     54 y.o. 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  Assessment & Plan    1.  Acute on chronic systolic heart failure with mixed cardiomyopathy complicated by ongoing cocaine abuse.  LVEF 20 to 25% with low normal RV contraction.  Currently on Lanoxin and BiDil, no ARB or Entresto low normal blood pressure and fluctuating renal insufficiency.  No beta-blocker due to low output.  He has been placed on Lasix drip 10 mg/h with potassium supplement, net output is incomplete but he states that he is feeling better and weight is down 1 kg.  2.  Moderate to severe mitral regurgitation.  3.  CKD stage IIIb with acute insufficiency, creatinine up to 1.92.  He has cardiorenal syndrome.  4.  Ongoing cocaine abuse, UDS positive.  5.  Homeless status, social work involved.  6.  History of COVID-19 infection in September of this year.  7.  CAD status post anterior STEMI with DES to the LAD in 2015 and medically managed OM1 and PLV disease as of 2019.  No definite angina symptoms.  Currently on aspirin and Lipitor.  8.  Intermittent abdominal pain.  Questionable mass by CT (could be food) with GI consultation recommended.  AST and ALT normal.  Continue aspirin, Lipitor, Lanoxin, and BiDil.  Decrease Lasix drip to 8 mg/h and continue potassium supplement.  Check BMET in AM,  would not start Aldactone as yet.  If creatinine continues to climb and he remains fluid overloaded despite diuretics, may need to consider a limited course of milrinone, although he is not a candidate for home use or other advanced strategies.  Signed, Rozann Lesches, MD  11/02/2020, 10:03 AM

## 2020-11-03 DIAGNOSIS — I5023 Acute on chronic systolic (congestive) heart failure: Secondary | ICD-10-CM

## 2020-11-03 LAB — COMPREHENSIVE METABOLIC PANEL
ALT: 31 U/L (ref 0–44)
AST: 27 U/L (ref 15–41)
Albumin: 3.3 g/dL — ABNORMAL LOW (ref 3.5–5.0)
Alkaline Phosphatase: 111 U/L (ref 38–126)
Anion gap: 12 (ref 5–15)
BUN: 38 mg/dL — ABNORMAL HIGH (ref 6–20)
CO2: 27 mmol/L (ref 22–32)
Calcium: 9.6 mg/dL (ref 8.9–10.3)
Chloride: 99 mmol/L (ref 98–111)
Creatinine, Ser: 1.74 mg/dL — ABNORMAL HIGH (ref 0.61–1.24)
GFR, Estimated: 46 mL/min — ABNORMAL LOW (ref >60)
Glucose, Bld: 127 mg/dL — ABNORMAL HIGH (ref 70–99)
Potassium: 4 mmol/L (ref 3.5–5.1)
Sodium: 138 mmol/L (ref 135–145)
Total Bilirubin: 1.1 mg/dL (ref 0.3–1.2)
Total Protein: 7.3 g/dL (ref 6.5–8.1)

## 2020-11-03 LAB — CBC
HCT: 40.6 % (ref 39.0–52.0)
Hemoglobin: 13.4 g/dL (ref 13.0–17.0)
MCH: 28.9 pg (ref 26.0–34.0)
MCHC: 33 g/dL (ref 30.0–36.0)
MCV: 87.7 fL (ref 80.0–100.0)
Platelets: 262 10*3/uL (ref 150–400)
RBC: 4.63 MIL/uL (ref 4.22–5.81)
RDW: 17.5 % — ABNORMAL HIGH (ref 11.5–15.5)
WBC: 7.8 10*3/uL (ref 4.0–10.5)
nRBC: 0 % (ref 0.0–0.2)

## 2020-11-03 MED ORDER — SPIRONOLACTONE 12.5 MG HALF TABLET
12.5000 mg | ORAL_TABLET | Freq: Every day | ORAL | Status: DC
  Administered 2020-11-03: 12.5 mg via ORAL
  Filled 2020-11-03: qty 1

## 2020-11-03 NOTE — Progress Notes (Signed)
PROGRESS NOTE    Tyler Wong  WYO:[id number] DOB: 04-28-1966 DOA: 10/31/2020 PCP: [name], NP   Chief Complaint  Patient presents with  . Chest Pain  . Shortness of Breath    Brief Narrative: 54 y.o. male with medical history significant of advanced chronic systolic CHF with LVEF 20 to 25%, cocaine abuse, CKD stage II, presented with worsening of fluid retention. Patient been taking 80 mg torsemide twice daily for chronic systolic CHF. On and off, he noticed fluid retention around his ankles, and " feeling congested in my stomach, very easy to get food after eating any food", as educated by CHF team, has been taking extra dose of torsemide but he started to feel congestion and usually prevent fluid retention to become worse. However in the last 2 days, despite increasing his torsemide from 80 mg twice daily to 100 mg twice daily, I do not see increased urine output and meantime he started to feel increasing shortness of breath and again "bloated stomach". He claimed that he still use cocaine and the last time he used it was more than 2 weeks ago. And he also claims he has been compliant with fluid intake of less than 2000 mL/day and watch sodium intake. He denied any chest pain, no cough, no diarrhea, no fever or chills.  ED Course: X-ray shows cardiomegaly but no significant pulmonary congestion, troponin 220>190. Patient is admitted, seen by cardiology given his fluid overload placed on IV Lasix drip. Patient was started on IV Lasix drip 10 mg/h had uptrending creatinine so Lasix decreased to 8 mg/h 11/6  Subjective: Patient endorses shortness of breath with activity.  Reports he was in the bathroom and was trying to have a bowel movement and with pressure was having epigastric pain discomfort and it made him more short of breath.Creatinine improving.. Remains on Lasix drip.  Assessment & Plan:  Acute on chronic systolic CHF/mitral regurgitation moderate to severe in recent  echo-likely from dilated CM: CHF exacerbation in the setting of noncompliance, substance abuse.  Appreciate cardiology input, patient is not a candidate for advanced therapy.  Palliative care approach has been recommended by cardiology but patient has adamantly refused.  Continue Lasix drip but decreasing due to elevated creatinine as per cardiology continue BiDil.  If he remains fluid overloaded despite diuretics and creatinine continues to climb he may need limited course of milrinone.  Abdominal bloating: Suspecting from his low EF.  Lipase LFTs normal  Abnormal CT abdomen pelvis, done for abdominal bloating, it showed masslike area in the antral prepyloric region of the stomach, simply may represent ingested food but the possibility of primary gastric neoplasm warrants consideration .  I had discussed with Dr. Alessandra Bevels  advised upper GI endoscopy-that shows limited exam with evidence of potential mass or bezoar in the region of the antrum of the stomach further evaluation with endoscopy is suggested, diffuse gastric wall thickening.  Given his acute on chronic systolic CHF will need to see how cardiology feels about further work-up, egd etcs-we will ask Dr. Michail Sermon to evaluate. Of note  previous CT abdomen pelvis with CT contrast on 04/09/2020 with no such finding.  Per cardiology he is not medically optimized to undergo EGD, await for further cardiac recommendation and GI recommendation  Elevated troponin flat in the setting of CHF,cocaine use.  Did not have chest pain this morning.  Continue telemetry  Cocaine abuse patient denied using cocaine recently but was more than a month ago.  Urine drug screen noted.  AKI on CKD stage IIIa: Creatinine up trended to 1.9 improving 1.7, tolerating Lasix, Lasix drip dose was decreased.  Monitor closely while on diuretics.   Recent Labs  Lab 10/27/20 2221 10/31/20 1451 11/01/20 0205 11/02/20 0054 11/03/20 0403  BUN 45* 31* 35* 36* 38*  CREATININE 1.80*  1.64* 1.58* 1.92* 1.74*   Recent Covid infection 09/19/2020  Nutrition: Diet Order            Diet Heart Room service appropriate? Yes; Fluid consistency: Thin; Fluid restriction: 1800 mL Fluid  Diet effective now                 Body mass index is 22.58 kg/m.  DVT prophylaxis: enoxaparin (LOVENOX) injection 40 mg Start: 10/31/20 2030 Code Status:   Code Status: Full Code  Family Communication: plan of care discussed with patient at bedside.  Status is: Admitted as observation  Patient remains to be hospitalized due to ongoing tachypnea tachycardia shortness of breath abdomen bloating.  Continue diuresis, obtain further evaluation with CT abdomen pelvis, cardiology consultation.  Dispo: The patient is from: Home              Anticipated d/c is to: Home              Anticipated d/c date is: 3 days              Patient currently is not medically stable to d/c. Consultants:see note  Procedures:see note  Culture/Microbiology  Medications: Scheduled Meds: . aspirin EC  81 mg Oral Daily  . atorvastatin  80 mg Oral Daily  . digoxin  0.125 mg Oral Daily  . empagliflozin  10 mg Oral Daily  . enoxaparin (LOVENOX) injection  40 mg Subcutaneous Q24H  . isosorbide-hydrALAZINE  1 tablet Oral TID  . multivitamin with minerals  1 tablet Oral Daily  . pantoprazole  40 mg Oral Daily  . potassium chloride SA  20 mEq Oral Daily  . sodium chloride flush  3 mL Intravenous Q12H   Continuous Infusions: . sodium chloride    . furosemide (LASIX) 200 mg in dextrose 5% 100 mL (2mg /mL) infusion 8 mg/hr (11/03/20 0739)    Antimicrobials: Anti-infectives (From admission, onward)   None     Objective: Vitals: Today's Vitals   11/03/20 0511 11/03/20 0728 11/03/20 0810 11/03/20 0815  BP: 100/65 97/77  108/86  Pulse: (!) 102 100    Resp: 18 16    Temp: 98.7 F (37.1 C) 97.8 F (36.6 C)    TempSrc: Oral Oral    SpO2: 100% 95%    Weight: 72.4 kg     Height:      PainSc:   0-No pain      Intake/Output Summary (Last 24 hours) at 11/03/2020 0854 Last data filed at 11/03/2020 0730 Gross per 24 hour  Intake 402.39 ml  Output 1700 ml  Net -1297.61 ml   Filed Weights   11/01/20 1525 11/02/20 0444 11/03/20 0511  Weight: 71.2 kg 70.7 kg 72.4 kg   Weight change: 1.231 kg  Intake/Output from previous day: 11/06 0701 - 11/07 0700 In: 402.4 [P.O.:300; I.V.:102.4] Out: 1300 [Urine:1300] Intake/Output this shift: Total I/O In: -  Out: 400 [Urine:400]  Examination: General exam: AAO x3, old for his age, NAD, weak appearing. HEENT:Oral mucosa moist, Ear/Nose WNL grossly, dentition normal. Respiratory system: bilaterally clear breath sound,no wheezing or crackles,no use of accessory muscle Cardiovascular system: S1 & S2 +, No JVD,. Gastrointestinal system: Abdomen soft, NT,ND, BS+ Nervous  System:Alert, awake, moving extremities and grossly nonfocal Extremities: No edema, distal peripheral pulses palpable.  Skin: No rashes,no icterus. MSK: Normal muscle bulk,tone, power  Data Reviewed: I have personally reviewed following labs and imaging studies CBC: Recent Labs  Lab 10/27/20 2221 10/31/20 1451 11/02/20 0054 11/03/20 0403  WBC 9.2 7.7 8.7 7.8  NEUTROABS 6.5  --   --   --   HGB 12.8* 14.3 11.7* 13.4  HCT 38.7* 45.6 36.6* 40.6  MCV 87.2 91.6 86.9 87.7  PLT 290 235 214 295   Basic Metabolic Panel: Recent Labs  Lab 10/27/20 2221 10/31/20 1451 11/01/20 0205 11/02/20 0054 11/03/20 0403  NA 140 137 134* 138 138  K 3.6 3.5 3.8 3.8 4.0  CL 99 94* 96* 99 99  CO2 27 29 25 27 27   GLUCOSE 158* 123* 128* 121* 127*  BUN 45* 31* 35* 36* 38*  CREATININE 1.80* 1.64* 1.58* 1.92* 1.74*  CALCIUM 8.9 9.2 9.0 9.0 9.6   GFR: Estimated Creatinine Clearance: 49.7 mL/min (A) (by C-G formula based on SCr of 1.74 mg/dL (H)). Liver Function Tests: Recent Labs  Lab 10/27/20 2221 10/31/20 1604 11/02/20 0054 11/03/20 0403  AST 35 31 29 27   ALT 36 32 30 31  ALKPHOS 116  113 101 111  BILITOT 0.8 1.5* 1.2 1.1  PROT 7.1 7.2 6.3* 7.3  ALBUMIN 3.4* 3.3* 2.9* 3.3*   Recent Labs  Lab 10/31/20 1604  LIPASE 43   No results for input(s): AMMONIA in the last 168 hours. Coagulation Profile: No results for input(s): INR, PROTIME in the last 168 hours. Cardiac Enzymes: No results for input(s): CKTOTAL, CKMB, CKMBINDEX, TROPONINI in the last 168 hours. BNP (last 3 results) Recent Labs    01/30/20 1645  PROBNP 1,759*   HbA1C: No results for input(s): HGBA1C in the last 72 hours. CBG: No results for input(s): GLUCAP in the last 168 hours. Lipid Profile: No results for input(s): CHOL, HDL, LDLCALC, TRIG, CHOLHDL, LDLDIRECT in the last 72 hours. Thyroid Function Tests: No results for input(s): TSH, T4TOTAL, FREET4, T3FREE, THYROIDAB in the last 72 hours. Anemia Panel: No results for input(s): VITAMINB12, FOLATE, FERRITIN, TIBC, IRON, RETICCTPCT in the last 72 hours. Sepsis Labs: No results for input(s): PROCALCITON, LATICACIDVEN in the last 168 hours.  Recent Results (from the past 240 hour(s))  Respiratory Panel by RT PCR (Flu A&B, Covid) - Nasopharyngeal Swab     Status: None   Collection Time: 10/28/20  4:30 AM   Specimen: Nasopharyngeal Swab  Result Value Ref Range Status   SARS Coronavirus 2 by RT PCR NEGATIVE NEGATIVE Final    Comment: (NOTE) SARS-CoV-2 target nucleic acids are NOT DETECTED.  The SARS-CoV-2 RNA is generally detectable in upper respiratoy specimens during the acute phase of infection. The lowest concentration of SARS-CoV-2 viral copies this assay can detect is 131 copies/mL. A negative result does not preclude SARS-Cov-2 infection and should not be used as the sole basis for treatment or other patient management decisions. A negative result may occur with  improper specimen collection/handling, submission of specimen other than nasopharyngeal swab, presence of viral mutation(s) within the areas targeted by this assay, and  inadequate number of viral copies (<131 copies/mL). A negative result must be combined with clinical observations, patient history, and epidemiological information. The expected result is Negative.  Fact Sheet for Patients:  PinkCheek.be  Fact Sheet for Healthcare Providers:  GravelBags.it  This test is no t yet approved or cleared by the [place] and  has been authorized for detection and/or diagnosis of SARS-CoV-2 by FDA under an Emergency Use Authorization (EUA). This EUA will remain  in effect (meaning this test can be used) for the duration of the COVID-19 declaration under Section 564(b)(1) of the Act, 21 U.S.C. section 360bbb-3(b)(1), unless the authorization is terminated or revoked sooner.     Influenza A by PCR NEGATIVE NEGATIVE Final   Influenza B by PCR NEGATIVE NEGATIVE Final    Comment: (NOTE) The Xpert Xpress SARS-CoV-2/FLU/RSV assay is intended as an aid in  the diagnosis of influenza from Nasopharyngeal swab specimens and  should not be used as a sole basis for treatment. Nasal washings and  aspirates are unacceptable for Xpert Xpress SARS-CoV-2/FLU/RSV  testing.  Fact Sheet for Patients: PinkCheek.be  Fact Sheet for Healthcare Providers: GravelBags.it  This test is not yet approved or cleared by the [place] FDA and  has been authorized for detection and/or diagnosis of SARS-CoV-2 by  FDA under an Emergency Use Authorization (EUA). This EUA will remain  in effect (meaning this test can be used) for the duration of the  Covid-19 declaration under Section 564(b)(1) of the Act, 21  U.S.C. section 360bbb-3(b)(1), unless the authorization is  terminated or revoked. Performed at [facility], [address], [place], [place] [address]      Radiology Studies: CT ABDOMEN PELVIS WO CONTRAST  Result Date:  11/01/2020 CLINICAL DATA:  54 year old male with history of epigastric abdominal pain for the past few days. EXAM: CT ABDOMEN AND PELVIS WITHOUT CONTRAST TECHNIQUE: Multidetector CT imaging of the abdomen and pelvis was performed following the standard protocol without IV contrast. COMPARISON:  CT the chest, abdomen and pelvis 04/09/2020. FINDINGS: Lower chest: Cardiomegaly. Atherosclerotic calcifications in the left main, left anterior descending, left circumflex and right coronary arteries. Hepatobiliary: Calcified granuloma in the right lobe of the liver. No other definite suspicious cystic or solid hepatic lesions are confidently identified on today's noncontrast CT examination. Unenhanced appearance of the gallbladder is normal. Pancreas: No definite pancreatic mass or peripancreatic fluid collections or inflammatory changes are noted on today's noncontrast CT examination. Spleen: Unremarkable. Adrenals/Urinary Tract: There are no abnormal calcifications within the collecting system of either kidney, along the course of either ureter, or within the lumen of the urinary bladder. No hydroureteronephrosis or perinephric stranding to suggest urinary tract obstruction at this time. The unenhanced appearance of the kidneys is unremarkable bilaterally. Unenhanced appearance of the urinary bladder is normal. Bilateral adrenal glands are normal in appearance. Stomach/Bowel: In the antral pre-pyloric region of the stomach there is a masslike area estimated to measure approximately 6.3 x 5.6 x 5.6 cm (axial image 31 of series 3 and sagittal image 98 of series 7). No pathologic dilatation of small bowel or colon. Normal appendix. Vascular/Lymphatic: Aortic atherosclerosis. No lymphadenopathy noted in the abdomen or pelvis. Reproductive: Prostate gland and seminal vesicles are unremarkable in appearance. Other: No significant volume of ascites.  No pneumoperitoneum. Musculoskeletal: There are no aggressive appearing lytic  or blastic lesions noted in the visualized portions of the skeleton. IMPRESSION: 1. No definite acute findings are noted in the abdomen or pelvis to account for the patient's symptoms. 2. However, there is a mass-like area in the antral pre-pyloric region of the stomach. This may simply represent ingested food, however, the possibility of a primary gastric neoplasm warrants consideration. Further evaluation with upper GI or endoscopy should be considered in the near future to exclude the possibility of malignancy. 3. Aortic atherosclerosis,  in addition to left main and 3 vessel coronary artery disease. Please note that although the presence of coronary artery calcium documents the presence of coronary artery disease, the severity of this disease and any potential stenosis cannot be assessed on this non-gated CT examination. Assessment for potential risk factor modification, dietary therapy or pharmacologic therapy may be warranted, if clinically indicated. 4. Cardiomegaly. Electronically Signed   By: Vinnie Langton M.D.   On: 11/01/2020 12:47   DG UGI W DOUBLE CM (HD BA)  Result Date: 11/02/2020 CLINICAL DATA:  54 year old male with epigastric abdominal pain. Possible gastric mass noted on CT the abdomen and pelvis. EXAM: UPPER GI SERIES WITH KUB TECHNIQUE: After obtaining a scout radiograph a routine upper GI series was performed using thin and high density barium. FLUOROSCOPY TIME:  Fluoroscopy Time:  3 minutes and 6 seconds Radiation Exposure Index (if provided by the fluoroscopic device): 42.3 mGy COMPARISON:  No prior.  CT the abdomen and pelvis 11/01/2020. FINDINGS: Preprocedural KUB demonstrates a nonobstructive bowel gas pattern, and is otherwise unremarkable. Double contrast images of the esophagus demonstrate a normal appearance of the esophageal mucosa. Double contrast images of the stomach were severely limited by under distension of the stomach and what appears to be retained gastric contents.  With this limitation in mind, there is a persistent mass-like accumulation of barium in the antrum of the stomach which is suspicious for barium outlining either a mass, or collection of material such as a bezoar. In addition, diffuse gastric fold thickening was noted. Multiple single swallow attempts were observed which demonstrated some mild proximal escape, but otherwise normal esophageal motility. Full column esophagram demonstrated normal esophageal anatomy. Specifically, no hiatal hernia, and no evidence of esophageal mass, stricture or esophageal ring. A barium tablet was administered, which passed readily into the stomach. IMPRESSION: 1. Limited examination with evidence of potential mass or bezoar in the region of the antrum of the stomach. Further evaluation with endoscopy is suggested to better evaluate this finding. 2. Diffuse gastric fold thickening concerning for gastritis. 3. Normal appearance of the esophagus. Electronically Signed   By: Vinnie Langton M.D.   On: 11/02/2020 14:12     LOS: 2 days   Antonieta Pert, MD [facility]  11/03/2020, 8:54 AM

## 2020-11-03 NOTE — Progress Notes (Signed)
Progress Note  Patient Name: Tyler Wong Date of Encounter: 11/03/2020  Primary Cardiologist: Shelva Majestic, MD  Advanced Heart Failure Provider: Loralie Champagne, MD  Subjective   Sitting in bedside chair.  No breathlessness at rest.  Feeling of abdominal discomfort improving.  No nausea or emesis.  Inpatient Medications    Scheduled Meds: . aspirin EC  81 mg Oral Daily  . atorvastatin  80 mg Oral Daily  . digoxin  0.125 mg Oral Daily  . empagliflozin  10 mg Oral Daily  . enoxaparin (LOVENOX) injection  40 mg Subcutaneous Q24H  . isosorbide-hydrALAZINE  1 tablet Oral TID  . multivitamin with minerals  1 tablet Oral Daily  . pantoprazole  40 mg Oral Daily  . potassium chloride SA  20 mEq Oral Daily  . sodium chloride flush  3 mL Intravenous Q12H   Continuous Infusions: . sodium chloride    . furosemide (LASIX) 200 mg in dextrose 5% 100 mL (2mg /mL) infusion 8 mg/hr (11/03/20 0739)   PRN Meds: sodium chloride, acetaminophen, ipratropium, ondansetron (ZOFRAN) IV, sodium chloride flush   Vital Signs    Vitals:   11/02/20 2007 11/03/20 0511 11/03/20 0728 11/03/20 0815  BP: 98/72 100/65 97/77 108/86  Pulse: (!) 110 (!) 102 100   Resp: 20 18 16    Temp: 98 F (36.7 C) 98.7 F (37.1 C) 97.8 F (36.6 C)   TempSrc: Oral Oral Oral   SpO2: 94% 100% 95%   Weight:  72.4 kg    Height:        Intake/Output Summary (Last 24 hours) at 11/03/2020 0852 Last data filed at 11/03/2020 0730 Gross per 24 hour  Intake 402.39 ml  Output 1700 ml  Net -1297.61 ml   Filed Weights   11/01/20 1525 11/02/20 0444 11/03/20 0511  Weight: 71.2 kg 70.7 kg 72.4 kg    Telemetry    Sinus rhythm and sinus tachycardia.  Personally reviewed.  ECG    An ECG dated 11/01/2020 was personally reviewed today and demonstrated:  Sinus tachycardia with biatrial enlargement, rightward axis, rule out old inferior infarct pattern, nonspecific ST changes.  Physical Exam   GEN: No acute distress.   Neck:   Elevated JVP. Cardiac:  RRR, 2/6 systolic murmur at apex, no gallop. Respiratory:  Nonlabored, clear to auscultation. GI:  Nondistended, bowel sounds present. MS:  No pitting edema; No deformity.  Labs    Chemistry Recent Labs  Lab 10/31/20 1451 10/31/20 1604 11/01/20 0205 11/02/20 0054 11/03/20 0403  NA   < >  --  134* 138 138  K   < >  --  3.8 3.8 4.0  CL   < >  --  96* 99 99  CO2   < >  --  25 27 27   GLUCOSE   < >  --  128* 121* 127*  BUN   < >  --  35* 36* 38*  CREATININE   < >  --  1.58* 1.92* 1.74*  CALCIUM   < >  --  9.0 9.0 9.6  PROT  --  7.2  --  6.3* 7.3  ALBUMIN  --  3.3*  --  2.9* 3.3*  AST  --  31  --  29 27  ALT  --  32  --  30 31  ALKPHOS  --  113  --  101 111  BILITOT  --  1.5*  --  1.2 1.1  GFRNONAA   < >  --  52* 41* 46*  ANIONGAP   < >  --  13 12 12    < > = values in this interval not displayed.     Hematology Recent Labs  Lab 10/31/20 1451 11/02/20 0054 11/03/20 0403  WBC 7.7 8.7 7.8  RBC 4.98 4.21* 4.63  HGB 14.3 11.7* 13.4  HCT 45.6 36.6* 40.6  MCV 91.6 86.9 87.7  MCH 28.7 27.8 28.9  MCHC 31.4 32.0 33.0  RDW 17.7* 17.2* 17.5*  PLT 235 214 262    Cardiac Enzymes Recent Labs  Lab 10/31/20 1451 10/31/20 1642  TROPONINIHS 227* 190*    BNP Recent Labs  Lab 10/31/20 1604  BNP 1,322.2*     Radiology    CT ABDOMEN PELVIS WO CONTRAST  Result Date: 11/01/2020 CLINICAL DATA:  54 year old male with history of epigastric abdominal pain for the past few days. EXAM: CT ABDOMEN AND PELVIS WITHOUT CONTRAST TECHNIQUE: Multidetector CT imaging of the abdomen and pelvis was performed following the standard protocol without IV contrast. COMPARISON:  CT the chest, abdomen and pelvis 04/09/2020. FINDINGS: Lower chest: Cardiomegaly. Atherosclerotic calcifications in the left main, left anterior descending, left circumflex and right coronary arteries. Hepatobiliary: Calcified granuloma in the right lobe of the liver. No other definite suspicious  cystic or solid hepatic lesions are confidently identified on today's noncontrast CT examination. Unenhanced appearance of the gallbladder is normal. Pancreas: No definite pancreatic mass or peripancreatic fluid collections or inflammatory changes are noted on today's noncontrast CT examination. Spleen: Unremarkable. Adrenals/Urinary Tract: There are no abnormal calcifications within the collecting system of either kidney, along the course of either ureter, or within the lumen of the urinary bladder. No hydroureteronephrosis or perinephric stranding to suggest urinary tract obstruction at this time. The unenhanced appearance of the kidneys is unremarkable bilaterally. Unenhanced appearance of the urinary bladder is normal. Bilateral adrenal glands are normal in appearance. Stomach/Bowel: In the antral pre-pyloric region of the stomach there is a masslike area estimated to measure approximately 6.3 x 5.6 x 5.6 cm (axial image 31 of series 3 and sagittal image 98 of series 7). No pathologic dilatation of small bowel or colon. Normal appendix. Vascular/Lymphatic: Aortic atherosclerosis. No lymphadenopathy noted in the abdomen or pelvis. Reproductive: Prostate gland and seminal vesicles are unremarkable in appearance. Other: No significant volume of ascites.  No pneumoperitoneum. Musculoskeletal: There are no aggressive appearing lytic or blastic lesions noted in the visualized portions of the skeleton. IMPRESSION: 1. No definite acute findings are noted in the abdomen or pelvis to account for the patient's symptoms. 2. However, there is a mass-like area in the antral pre-pyloric region of the stomach. This may simply represent ingested food, however, the possibility of a primary gastric neoplasm warrants consideration. Further evaluation with upper GI or endoscopy should be considered in the near future to exclude the possibility of malignancy. 3. Aortic atherosclerosis, in addition to left main and 3 vessel coronary  artery disease. Please note that although the presence of coronary artery calcium documents the presence of coronary artery disease, the severity of this disease and any potential stenosis cannot be assessed on this non-gated CT examination. Assessment for potential risk factor modification, dietary therapy or pharmacologic therapy may be warranted, if clinically indicated. 4. Cardiomegaly. Electronically Signed   By: Vinnie Langton M.D.   On: 11/01/2020 12:47   DG UGI W DOUBLE CM (HD BA)  Result Date: 11/02/2020 CLINICAL DATA:  54 year old male with epigastric abdominal pain. Possible gastric mass noted on CT the abdomen  and pelvis. EXAM: UPPER GI SERIES WITH KUB TECHNIQUE: After obtaining a scout radiograph a routine upper GI series was performed using thin and high density barium. FLUOROSCOPY TIME:  Fluoroscopy Time:  3 minutes and 6 seconds Radiation Exposure Index (if provided by the fluoroscopic device): 42.3 mGy COMPARISON:  No prior.  CT the abdomen and pelvis 11/01/2020. FINDINGS: Preprocedural KUB demonstrates a nonobstructive bowel gas pattern, and is otherwise unremarkable. Double contrast images of the esophagus demonstrate a normal appearance of the esophageal mucosa. Double contrast images of the stomach were severely limited by under distension of the stomach and what appears to be retained gastric contents. With this limitation in mind, there is a persistent mass-like accumulation of barium in the antrum of the stomach which is suspicious for barium outlining either a mass, or collection of material such as a bezoar. In addition, diffuse gastric fold thickening was noted. Multiple single swallow attempts were observed which demonstrated some mild proximal escape, but otherwise normal esophageal motility. Full column esophagram demonstrated normal esophageal anatomy. Specifically, no hiatal hernia, and no evidence of esophageal mass, stricture or esophageal ring. A barium tablet was  administered, which passed readily into the stomach. IMPRESSION: 1. Limited examination with evidence of potential mass or bezoar in the region of the antrum of the stomach. Further evaluation with endoscopy is suggested to better evaluate this finding. 2. Diffuse gastric fold thickening concerning for gastritis. 3. Normal appearance of the esophagus. Electronically Signed   By: Vinnie Langton M.D.   On: 11/02/2020 14:12    Cardiac Studies   Echocardiogram 10/04/2020: 1. 3 D images of the LV were obtained. . Left ventricular ejection  fraction, by estimation, is 20 to 25%. The left ventricle has severely  decreased function. The left ventricle demonstrates global hypokinesis.  The left ventricular internal cavity size  was moderately dilated. There is mild left ventricular hypertrophy. Left  ventricular diastolic parameters are consistent with Grade II diastolic  dysfunction (pseudonormalization).  2. Right ventricular systolic function is low normal. The right  ventricular size is normal. There is moderately elevated pulmonary artery  systolic pressure. The estimated right ventricular systolic pressure is  40.9 mmHg.  3. Left atrial size was moderately dilated.  4. Right atrial size was severely dilated.  5. The mitral valve is grossly normal. Moderate to severe mitral valve  regurgitation. Mild mitral stenosis.  6. Tricuspid valve regurgitation is moderate to severe.  7. The aortic valve is normal in structure. Aortic valve regurgitation is  not visualized. No aortic stenosis is present.   Patient Profile     54 y.o. 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  Assessment & Plan    1.  Acute on chronic systolic  heart failure with mixed cardiomyopathy complicated by ongoing cocaine abuse.  LVEF 20 to 25% with low normal RV contraction.  Currently on Lanoxin and BiDil, no ARB or Entresto low normal blood pressure and fluctuating renal insufficiency.  No beta-blocker due to low output.  Lasix drip cut back to 8 mg/h which he is tolerating with continued gradual diuresis.  Renal function looks better today with creatinine down to 1.74.  2.  Moderate to severe mitral regurgitation.  3.  CKD stage IIIb with acute insufficiency, creatinine  today is down to 1.74 from 1.92.  He has cardiorenal syndrome.  4.  Ongoing cocaine abuse, UDS positive.  5.  Homeless status, social work involved.  6.  History of COVID-19 infection in September of this year.  7.  CAD status post anterior STEMI with DES to the LAD in 2015 and medically managed OM1 and PLV disease as of 2019.  No definite angina symptoms.  Currently on aspirin and Lipitor.  8.  Intermittent abdominal pain.  Questionable mass in stomach by CT. Upper GI series with KUB suggest potential mass or bezoar in the antrum diffuse gastric fold thickening suggestive of gastritis, normal-appearing esophagus.  GI consulted per primary team.  May need endoscopy if can be further optimized from heart failure status.  Continue Lasix drip at 8 mg/h along with potassium supplement.  Start Aldactone 12.5 mg daily.  Continue aspirin, Lanoxin, Lipitor, and BiDil.  Check BMET in a.m.  Signed, Rozann Lesches, MD  11/03/2020, 8:52 AM

## 2020-11-03 NOTE — H&P (View-Only) (Signed)
[facility] Gastroenterology Progress Note  Tyler Wong 54 y.o. [birth year]   Subjective: Feels ok. Felt bloated with eating solid food this morning. Reports that abdominal pain can occur from eating or drinking water. Denies N/V.  Objective: Vital signs: Vitals:   11/03/20 0815 11/03/20 1042  BP: 108/86 100/73  Pulse:  90  Resp:  20  Temp:  (!) 97.4 F (36.3 C)  SpO2:  97%    Physical Exam: Gen: alert, no acute distress, thin HEENT: anicteric sclera CV: RRR Chest: CTA B Abd: upper quadrant tenderness with guarding, soft, nondistended, +BS Ext: no edema  Lab Results: Recent Labs    11/02/20 0054 11/03/20 0403  NA 138 138  K 3.8 4.0  CL 99 99  CO2 27 27  GLUCOSE 121* 127*  BUN 36* 38*  CREATININE 1.92* 1.74*  CALCIUM 9.0 9.6   Recent Labs    11/02/20 0054 11/03/20 0403  AST 29 27  ALT 30 31  ALKPHOS 101 111  BILITOT 1.2 1.1  PROT 6.3* 7.3  ALBUMIN 2.9* 3.3*   Recent Labs    11/02/20 0054 11/03/20 0403  WBC 8.7 7.8  HGB 11.7* 13.4  HCT 36.6* 40.6  MCV 86.9 87.7  PLT 214 262      Assessment/Plan: Abdominal pain with a question of an antral gastric mass on CT and UGIS in need of an EGD when CHF stable. Will do EGD when cardiac clearance obtained. Patient wants to eat but I am concerned about giving him solid food based on imaging showing distal gastric mass. Will change diet to full liquids and advance to soft diet. Please contact us when cardiac clearance has been obtained.    Tyler Wong 11/03/2020, 11:49 AM  Questions please call [phone] ID: Tyler Wong, male   DOB: [birth year], 54 y.o.   MRN: [id number]

## 2020-11-03 NOTE — Progress Notes (Signed)
[facility] Gastroenterology Progress Note  Tyler Wong 54 y.o. 08/12/1966   Subjective: Feels ok. Felt bloated with eating solid food this morning. Reports that abdominal pain can occur from eating or drinking water. Denies N/V.  Objective: Vital signs: Vitals:   11/03/20 0815 11/03/20 1042  BP: 108/86 100/73  Pulse:  90  Resp:  20  Temp:  (!) 97.4 F (36.3 C)  SpO2:  97%    Physical Exam: Gen: alert, no acute distress, thin HEENT: anicteric sclera CV: RRR Chest: CTA B Abd: upper quadrant tenderness with guarding, soft, nondistended, +BS Ext: no edema  Lab Results: Recent Labs    11/02/20 0054 11/03/20 0403  NA 138 138  K 3.8 4.0  CL 99 99  CO2 27 27  GLUCOSE 121* 127*  BUN 36* 38*  CREATININE 1.92* 1.74*  CALCIUM 9.0 9.6   Recent Labs    11/02/20 0054 11/03/20 0403  AST 29 27  ALT 30 31  ALKPHOS 101 111  BILITOT 1.2 1.1  PROT 6.3* 7.3  ALBUMIN 2.9* 3.3*   Recent Labs    11/02/20 0054 11/03/20 0403  WBC 8.7 7.8  HGB 11.7* 13.4  HCT 36.6* 40.6  MCV 86.9 87.7  PLT 214 262      Assessment/Plan: Abdominal pain with a question of an antral gastric mass on CT and UGIS in need of an EGD when CHF stable. Will do EGD when cardiac clearance obtained. Patient wants to eat but I am concerned about giving him solid food based on imaging showing distal gastric mass. Will change diet to full liquids and advance to soft diet. Please contact us when cardiac clearance has been obtained.    [name] 11/03/2020, 11:49 AM  Questions please call [phone] ID: [name], male   DOB: 12-30-1965, 54 y.o.   MRN: [id number]

## 2020-11-04 LAB — COMPREHENSIVE METABOLIC PANEL
ALT: 28 U/L (ref 0–44)
AST: 24 U/L (ref 15–41)
Albumin: 3.3 g/dL — ABNORMAL LOW (ref 3.5–5.0)
Alkaline Phosphatase: 109 U/L (ref 38–126)
Anion gap: 12 (ref 5–15)
BUN: 38 mg/dL — ABNORMAL HIGH (ref 6–20)
CO2: 27 mmol/L (ref 22–32)
Calcium: 9.2 mg/dL (ref 8.9–10.3)
Chloride: 95 mmol/L — ABNORMAL LOW (ref 98–111)
Creatinine, Ser: 1.56 mg/dL — ABNORMAL HIGH (ref 0.61–1.24)
GFR, Estimated: 52 mL/min — ABNORMAL LOW (ref >60)
Glucose, Bld: 123 mg/dL — ABNORMAL HIGH (ref 70–99)
Potassium: 4 mmol/L (ref 3.5–5.1)
Sodium: 134 mmol/L — ABNORMAL LOW (ref 135–145)
Total Bilirubin: 1.5 mg/dL — ABNORMAL HIGH (ref 0.3–1.2)
Total Protein: 7.2 g/dL (ref 6.5–8.1)

## 2020-11-04 LAB — CBC
HCT: 41.7 % (ref 39.0–52.0)
Hemoglobin: 13.5 g/dL (ref 13.0–17.0)
MCH: 28.1 pg (ref 26.0–34.0)
MCHC: 32.4 g/dL (ref 30.0–36.0)
MCV: 86.7 fL (ref 80.0–100.0)
Platelets: 253 10*3/uL (ref 150–400)
RBC: 4.81 MIL/uL (ref 4.22–5.81)
RDW: 17.3 % — ABNORMAL HIGH (ref 11.5–15.5)
WBC: 7.9 10*3/uL (ref 4.0–10.5)
nRBC: 0 % (ref 0.0–0.2)

## 2020-11-04 MED ORDER — SPIRONOLACTONE 25 MG PO TABS
25.0000 mg | ORAL_TABLET | Freq: Every day | ORAL | Status: DC
  Administered 2020-11-04 – 2020-11-06 (×3): 25 mg via ORAL
  Filled 2020-11-04 (×3): qty 1

## 2020-11-04 MED ORDER — TORSEMIDE 20 MG PO TABS
100.0000 mg | ORAL_TABLET | Freq: Every day | ORAL | Status: DC
  Administered 2020-11-04 – 2020-11-05 (×2): 100 mg via ORAL
  Filled 2020-11-04 (×2): qty 5

## 2020-11-04 NOTE — Progress Notes (Signed)
[facility] Gastroenterology Progress Note  Tyler Wong 54 y.o. 1966/09/19  CC:  Abnormal upper GI series (possible gastric mass), abdominal pain  Subjective: Patient reports epigastric abdominal pain today.  Denies any nausea or vomiting.  Is tolerating a full liquid diet and already had breakfast.  Had 2 bowel movements earlier this morning, the patient states he is unsure what color they were.  Denies any chest pain or shortness of breath.  ROS : Review of Systems  Cardiovascular: Negative for chest pain and palpitations.  Gastrointestinal: Positive for abdominal pain. Negative for blood in stool, constipation, diarrhea, heartburn, melena, nausea and vomiting.   Objective: Vital signs in last 24 hours: Vitals:   11/04/20 0744 11/04/20 0824  BP: 111/64 96/74  Pulse: (!) 108 (!) 103  Resp: 17 17  Temp: 98.1 F (36.7 C)   SpO2: 97% 95%    Physical Exam:  General:  Alert, oriented, cooperative, no distress, appears stated age  Head:  Normocephalic, without obvious abnormality, atraumatic  Eyes:   Anicteric sclera, EOM's intact  Lungs:   Clear to auscultation bilaterally, respirations unlabored  Heart:  Mildly tachycardic with regular rhythm, S1, S2 normal  Abdomen:   Soft, moderate epigastric tenderness with guarding, bowel sounds active all four quadrants, no peritoneal signs  Extremities: Extremities normal, atraumatic, no  edema  Pulses: 2+ and symmetric    Lab Results: Recent Labs    11/03/20 0403 11/04/20 0254  NA 138 134*  K 4.0 4.0  CL 99 95*  CO2 27 27  GLUCOSE 127* 123*  BUN 38* 38*  CREATININE 1.74* 1.56*  CALCIUM 9.6 9.2   Recent Labs    11/03/20 0403 11/04/20 0254  AST 27 24  ALT 31 28  ALKPHOS 111 109  BILITOT 1.1 1.5*  PROT 7.3 7.2  ALBUMIN 3.3* 3.3*   Recent Labs    11/03/20 0403 11/04/20 0254  WBC 7.8 7.9  HGB 13.4 13.5  HCT 40.6 41.7  MCV 87.7 86.7  PLT 262 253   No results for input(s): LABPROT, INR in the last 72  hours.    Assessment: Abnormal upper GI series (possible gastric mass), abdominal pain. -Hemoglobin 13.5, stable  Acute on chronic CHF: EF 20-25% per echo 10/04/2020  AKI on CKD: BUN 38/creatinine 1.56 today  Plan: Cardiology provided clearance to proceed with EGD.  EGD tomorrow with Dr. Michail Sermon.  I thoroughly discussed the procedure with the patient to include nature, alternatives, benefits, and risks (including but not limited to bleeding, infection, perforation, anesthesia/cardiac and pulmonary complications).  Patient verbalized understanding and gave verbal consent to proceed with EGD.    Continue full liquid diet.  N.p.o. after midnight.  [facility] will follow.  Salley Slaughter PA-C 11/04/2020, 10:26 AM  Contact #  [phone]

## 2020-11-04 NOTE — Progress Notes (Addendum)
Advanced Heart Failure Rounding Note  PCP-Cardiologist: Shelva Majestic, MD   Subjective:   Continues to diurese with lasix drip at 8 mg per hour. Weight down another 5 pounds.   Feeling a little better today. Denies chest pain. Denies SOB. Says he wants to go to rehab.   Objective:   Weight Range: 69.9 kg Body mass index is 21.81 kg/m.   Vital Signs:   Temp:  [97.4 F (36.3 C)-98 F (36.7 C)] 98 F (36.7 C) (11/08 0247) Pulse Rate:  [90-110] 110 (11/08 0247) Resp:  [16-20] 20 (11/08 0247) BP: (97-110)/(72-86) 98/72 (11/08 0247) SpO2:  [95 %-100 %] 98 % (11/08 0247) Weight:  [69.9 kg] 69.9 kg (11/08 0247) Last BM Date: 11/01/20  Weight change: Filed Weights   11/02/20 0444 11/03/20 0511 11/04/20 0247  Weight: 70.7 kg 72.4 kg 69.9 kg    Intake/Output:   Intake/Output Summary (Last 24 hours) at 11/04/2020 0705 Last data filed at 11/04/2020 0437 Gross per 24 hour  Intake --  Output 3550 ml  Net -3550 ml      Physical Exam    General:  Well appearing. No resp difficulty HEENT: Normal Neck: Supple. JVP 5-6 Carotids 2+ bilat; no bruits. No lymphadenopathy or thyromegaly appreciated. Cor: PMI nondisplaced. Regular rate & rhythm. No rubs, or murmurs. +S3 Lungs: Clear Abdomen: Soft, nontender, nondistended. No hepatosplenomegaly. No bruits or masses. Good bowel sounds. Extremities: No cyanosis, clubbing, rash, edema Neuro: Alert & orientedx3, cranial nerves grossly intact. moves all 4 extremities w/o difficulty. Affect pleasant   Telemetry   ST with PVCs   EKG    N/a   Labs    CBC Recent Labs    11/03/20 0403 11/04/20 0254  WBC 7.8 7.9  HGB 13.4 13.5  HCT 40.6 41.7  MCV 87.7 86.7  PLT 262 102   Basic Metabolic Panel Recent Labs    11/03/20 0403 11/04/20 0254  NA 138 134*  K 4.0 4.0  CL 99 95*  CO2 27 27  GLUCOSE 127* 123*  BUN 38* 38*  CREATININE 1.74* 1.56*  CALCIUM 9.6 9.2   Liver Function Tests Recent Labs    11/03/20 0403  11/04/20 0254  AST 27 24  ALT 31 28  ALKPHOS 111 109  BILITOT 1.1 1.5*  PROT 7.3 7.2  ALBUMIN 3.3* 3.3*   No results for input(s): LIPASE, AMYLASE in the last 72 hours. Cardiac Enzymes No results for input(s): CKTOTAL, CKMB, CKMBINDEX, TROPONINI in the last 72 hours.  BNP: BNP (last 3 results) Recent Labs    09/21/20 0138 10/03/20 1222 10/31/20 1604  BNP 578.1* 2,190.9* 1,322.2*    ProBNP (last 3 results) Recent Labs    01/30/20 1645  PROBNP 1,759*     D-Dimer No results for input(s): DDIMER in the last 72 hours. Hemoglobin A1C No results for input(s): HGBA1C in the last 72 hours. Fasting Lipid Panel No results for input(s): CHOL, HDL, LDLCALC, TRIG, CHOLHDL, LDLDIRECT in the last 72 hours. Thyroid Function Tests No results for input(s): TSH, T4TOTAL, T3FREE, THYROIDAB in the last 72 hours.  Invalid input(s): FREET3  Other results:   Imaging     No results found.   Medications:     Scheduled Medications: . aspirin EC  81 mg Oral Daily  . atorvastatin  80 mg Oral Daily  . digoxin  0.125 mg Oral Daily  . empagliflozin  10 mg Oral Daily  . enoxaparin (LOVENOX) injection  40 mg Subcutaneous Q24H  . isosorbide-hydrALAZINE  1 tablet Oral TID  . multivitamin with minerals  1 tablet Oral Daily  . pantoprazole  40 mg Oral Daily  . potassium chloride SA  20 mEq Oral Daily  . sodium chloride flush  3 mL Intravenous Q12H  . spironolactone  12.5 mg Oral Daily     Infusions: . sodium chloride    . 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 Cath in 10/19 showed 90% ostial OM1 and 70% PLV, no interventional target. 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 Unfortunately, not a candidate for advanced therapies or home milrinone with active use of cocaine and current social situation homeless(SW following).  - Volume status improved. Stop lasix drip and start torsemide 100 mg daily - Continue Bidil 1 tablet TID (monitor BP) - Continue digoxin 0.125. Dig level ok 0.2  - Continue Jardiance 10 mg daily  -  BP too soft for ARNi  -  Increase spiro to 25 mg daily.  -  no ? blocker given h/o low output  -  Palliative care consult recommended, but pt adamantly declines.   2.  Mitral Regurgitation - moderate to severeon recent echo, suspectfunctional MR from dilated CM  3 CAD: H/o anterior STEMI with DES to LAD in 7/15.Cath in 10/19 with 90% ostial OM1 and 70% PLV, no interventional target. - no recent CP  -ContinueASA 81 and atorvastatin 80 mg daily - needs to refrain from using cocaine  4.Stage III CKD  - Baseline SCr  1.5 c/w baseline - Creatinine back down to 1.5 today.   5. Abdominal Pain/ Abnormal Abdominal CT - suspect abdominal pain be be 2/2 low output HF, but CT shows a mass-like area in the antral pre-pyloric region of the stomach. This may simply represent ingested food, however, cannot r/o the possibility of a primary gastric neoplasm.  - lipase WNL at 43 U/L. HFTs WNL -Plan for EGD. GI following.   6. Ongoing Cocaine abuse: - UDS +  - He would like inpatient rehab for ongoing substance abuse.  7.RecentCOVID Infection  - tested + 09/19/20  At d/c he will need all medications. He was given  meds on 10/09/20 at his last discharge but he has lost them.    Length of Stay: 3  Amy Clegg, NP  11/04/2020,  7:05 AM  Advanced Heart Failure Team Pager [phone] (M-F; 7a - 4p)  Please contact [facility] for night-coverage after hours (4p -7a ) and weekends on [email or web address]  Patient seen with NP, agree with the above note.   He diuresed well again yesterday, weight is now down 12 lbs.  Still having epigastric pain, now say better with eating.   General: NAD Neck: JVP 8-9 cm, no thyromegaly or thyroid nodule.  Lungs: Clear to auscultation bilaterally with normal respiratory effort. CV: Nondisplaced PMI.  Heart regular S1/S2, no S3/S4, 2/6 HSM apex.  No peripheral edema.   Abdomen: Soft, nontender, no hepatosplenomegaly, no distention.  Skin: Intact without lesions or rashes.  Neurologic: Alert and oriented x 3.  Psych:  Normal affect. Extremities: No clubbing or cyanosis.  HEENT: Normal.   Good diuresis with weight down.  Creatinine 1.5.  Continue Bidil 1 tab tid, digoxin, Jardiance.  Increase spironolactone to 25 mg daily.  Discontinue Lasix gtt and start torsemide 100 mg daily. End stage CHF but not candidate for advanced therapeies due to active cocaine use.   Ongoing abdominal pain with gastric antral mass, needs EGD.  Awaiting GI scheduling.  Ready today.   Loralie Champagne 11/04/2020 7:40 AM

## 2020-11-04 NOTE — TOC Initial Note (Signed)
Transition of Care [facility]) - Initial/Assessment Note    Patient Details  Name: Tyler Wong MRN: [id number] Date of Birth: 1966-06-02  Transition of Care [facility]) CM/SW Contact:    [name], RN Phone Number: 11/04/2020, 5:33 PM  Clinical Narrative:  Risk for readmission assessment completed. Patient states he now lives with his sister in [place]. He has no cost for medications and he gets to appointments without any issues. Patient states he has a scale at home. Patient is ambulatory and feels that he will not need a home health RN at this time. Case Manager will continue to follow for additional transition of care needs.                 Expected Discharge Plan: Home/Self Care Barriers to Discharge: No Barriers Identified   Patient Goals and CMS Choice Patient states their goals for this hospitalization and ongoing recovery are:: to return home.      Expected Discharge Plan and Services Expected Discharge Plan: Home/Self Care In-house Referral: NA Discharge Planning Services: CM Consult Post Acute Care Choice: NA Living arrangements for the past 2 months: Single Family Home                   DME Agency: NA       HH Arranged: NA     Prior Living Arrangements/Services Living arrangements for the past 2 months: Single Family Home Lives with:: Siblings Patient language and need for interpreter reviewed:: No Do you feel safe going back to the place where you live?: Yes      Need for Family Participation in Patient Care: No (Comment) Care giver support system in place?: No (comment)   Criminal Activity/Legal Involvement Pertinent to Current Situation/Hospitalization: No - Comment as needed  Activities of Daily Living Home Assistive Devices/Equipment: None ADL Screening (condition at time of admission) Patient's cognitive ability adequate to safely complete daily activities?: Yes Is the patient deaf or have difficulty hearing?: No Does the patient have  difficulty seeing, even when wearing glasses/contacts?: No Does the patient have difficulty concentrating, remembering, or making decisions?: No Patient able to express need for assistance with ADLs?: Yes Does the patient have difficulty dressing or bathing?: No Independently performs ADLs?: Yes (appropriate for developmental age) Does the patient have difficulty walking or climbing stairs?: No Weakness of Legs: None Weakness of Arms/Hands: None  Permission Sought/Granted Permission sought to share information with : Family Supports, Case Manager    Emotional Assessment Appearance:: Appears stated age Attitude/Demeanor/Rapport: Engaged Affect (typically observed): Appropriate Orientation: : Oriented to Self, Oriented to Place, Oriented to  Time, Oriented to Situation Alcohol / Substance Use: Not Applicable Psych Involvement: No (comment)  Admission diagnosis:  Precordial chest pain [R07.2] CHF (congestive heart failure) (HCC) [I50.9] Epigastric pain [R10.13] Abnormal CT of the abdomen [R93.5] Congestive heart failure (CHF) ([place]) [I50.9] Acute on chronic combined systolic and diastolic CHF (congestive heart failure) ([place]) [I50.43] Cardiomyopathy as manifestation of underlying disease ([place]) [I43] Patient Active Problem List   Diagnosis Date Noted  . Congestive heart failure (CHF) ([place]) 11/01/2020  . CHF (congestive heart failure) ([place]) 10/31/2020  . Palliative care by specialist   . Goals of care, counseling/discussion   . Acute systolic HF (heart failure) ([place]) 10/03/2020  . Acute on chronic systolic CHF (congestive heart failure) ([place]) 10/03/2020  . Acute renal failure superimposed on stage 3a chronic kidney disease ([place])   . Acute respiratory disease due to COVID-19 virus 09/20/2020  . Acute respiratory  failure due to COVID-19 ([place]) 09/19/2020  . Cardiorenal syndrome 06/29/2020  . 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 Acute on chronic clinical systolic  heart failure ([place]) 06/22/2020  . Acute on chronic systolic (congestive) heart failure ([place]) 06/22/2020  . Palliative care encounter   . Cardiogenic shock ([place]) 04/11/2020  . CKD (chronic kidney disease) stage 3, GFR 30-59 ml/min (HCC) 04/11/2020  . Symptomatic anemia 04/09/2020  . Acute on chronic HFrEF (heart failure with reduced ejection fraction) ([place]) 05/05/2019  . HFrEF (heart failure with reduced ejection fraction) ([place]) 05/04/2019  . Acute combined systolic and diastolic congestive heart failure ([place]) 10/02/2018  . Hepatitis C 04/13/2017  . Current non-adherence to medical treatment 04/13/2017  . Acute on chronic combined systolic (congestive) and diastolic (congestive) heart failure ([place]) 04/13/2017  . HTN (hypertension) 04/12/2017  . Insomnia 01/18/2017  . Chronic combined systolic and diastolic congestive heart failure ([place]) 01/04/2017  . Hyperlipidemia 01/04/2017  . Chest pain 12/30/2016  . Coronary artery disease involving native heart 07/18/2014  . Tobacco abuse 07/17/2014  . Cocaine use 07/17/2014   PCP:  [name], NP Pharmacy:   [place], [place] - [address] [place] [address] Phone: [phone] Fax: [phone]   Readmission Risk Interventions Readmission Risk Prevention Plan 11/04/2020 10/09/2020  Transportation Screening Complete Complete  Medication Review ([place]) Complete Complete  PCP or Specialist appointment within 3-5 days of discharge Complete -  [place] or [place] Complete Complete  SW Recovery Care/Counseling Consult Complete Complete  Palliative Care Screening Not Applicable Not [place] Not Applicable Not Applicable  Some recent data might be hidden

## 2020-11-04 NOTE — Progress Notes (Signed)
PROGRESS NOTE    Tyler Wong  IHK:[id number] DOB: 17-Mar-1966 DOA: 10/31/2020 PCP: [name], NP   Chief Complaint  Patient presents with  . Chest Pain  . Shortness of Breath    Brief Narrative: 54 y.o. male with medical history significant of advanced chronic systolic CHF with LVEF 20 to 25%, cocaine abuse, CKD stage II, presented with worsening of fluid retention. Patient been taking 80 mg torsemide twice daily for chronic systolic CHF. On and off, he noticed fluid retention around his ankles, and " feeling congested in my stomach, very easy to get food after eating any food", as educated by CHF team, has been taking extra dose of torsemide but he started to feel congestion and usually prevent fluid retention to become worse. However in the last 2 days, despite increasing his torsemide from 80 mg twice daily to 100 mg twice daily, I do not see increased urine output and meantime he started to feel increasing shortness of breath and again "bloated stomach". He claimed that he still use cocaine and the last time he used it was more than 2 weeks ago. And he also claims he has been compliant with fluid intake of less than 2000 mL/day and watch sodium intake. He denied any chest pain, no cough, no diarrhea, no fever or chills.  ED Course: X-ray shows cardiomegaly but no significant pulmonary congestion, troponin 220>190. Patient is admitted, seen by cardiology given his fluid overload placed on IV Lasix drip. Patient was started on IV Lasix drip 10 mg/h had uptrending creatinine so Lasix decreased to 8 mg/h 11/6  Subjective: Seen this morning resting comfortably.  Tolerated diet.  Assessment & Plan:  Acute on chronic systolic CHF/mitral regurgitation moderate to severe in recent echo-likely from dilated CM: CHF exacerbation in the setting of noncompliance, substance abuse.  Appreciate cardiology input, patient is not a candidate for advanced therapy.  Palliative care approach has been  recommended by cardiology but patient has adamantly refused.  Diuresed well on Lasix drip and switched to torsemide 100 mg, continue Aldactone, BiDil.  Appreciate cardiology input on board.   Abdominal bloating/Abnormal CT abdomen pelvis -masslike area in the antral prepyloric region, upper GI shows potential mass or bezoar in the region of the antrum of the stomach, GI has been consulted.  Per cardiology okay to proceed with endoscopy.  Elevated troponin flat in the setting of CHF,cocaine use.  Did not have chest pain this morning.  Continue telemetry  Cocaine abuse patient denied using cocaine recently but was more than a month ago.  Urine drug screen noted.  AKI on CKD stage IIIa: Creatinine up trended to 1.9, now nicely downtrending at 1.5.  Monitor closely while on diuretics.  Recent Labs  Lab 10/31/20 1451 11/01/20 0205 11/02/20 0054 11/03/20 0403 11/04/20 0254  BUN 31* 35* 36* 38* 38*  CREATININE 1.64* 1.58* 1.92* 1.74* 1.56*   Recent Covid infection 09/19/2020  Nutrition: Diet Order            Diet full liquid Room service appropriate? Yes; Fluid consistency: Thin  Diet effective now                 Body mass index is 21.81 kg/m.  DVT prophylaxis: enoxaparin (LOVENOX) injection 40 mg Start: 10/31/20 2030 Code Status:   Code Status: Full Code  Family Communication: plan of care discussed with patient at bedside.  Status is: Admitted as observation  Patient remains to be hospitalized due to ongoing management of CHF as  well as abnormal CT finding and abdominal bloating   Dispo: The patient is from: Home              Anticipated d/c is to: Home              Anticipated d/c date is: 1 day              Patient currently is not medically stable to d/c. Consultants:see note  Procedures:see note  Culture/Microbiology  Medications: Scheduled Meds: . aspirin EC  81 mg Oral Daily  . atorvastatin  80 mg Oral Daily  . digoxin  0.125 mg Oral Daily  . empagliflozin  10  mg Oral Daily  . enoxaparin (LOVENOX) injection  40 mg Subcutaneous Q24H  . isosorbide-hydrALAZINE  1 tablet Oral TID  . multivitamin with minerals  1 tablet Oral Daily  . pantoprazole  40 mg Oral Daily  . potassium chloride SA  20 mEq Oral Daily  . sodium chloride flush  3 mL Intravenous Q12H  . spironolactone  25 mg Oral Daily  . torsemide  100 mg Oral Daily   Continuous Infusions: . sodium chloride      Antimicrobials: Anti-infectives (From admission, onward)   None     Objective: Vitals: Today's Vitals   11/03/20 1507 11/03/20 1900 11/03/20 2350 11/04/20 0247  BP: 110/81  100/77 98/72  Pulse: 100 (!) 103 (!) 102 (!) 110  Resp: 20 18 20 20   Temp: 97.6 F (36.4 C) 98 F (36.7 C) 97.8 F (36.6 C) 98 F (36.7 C)  TempSrc: Oral Oral Oral Oral  SpO2: 97% 100% 99% 98%  Weight:    69.9 kg  Height:      PainSc:        Intake/Output Summary (Last 24 hours) at 11/04/2020 0742 Last data filed at 11/04/2020 0437 Gross per 24 hour  Intake --  Output 3150 ml  Net -3150 ml   Filed Weights   11/02/20 0444 11/03/20 0511 11/04/20 0247  Weight: 70.7 kg 72.4 kg 69.9 kg   Weight change: -2.455 kg  Intake/Output from previous day: 11/07 0701 - 11/08 0700 In: -  Out: 1025 [Urine:3550] Intake/Output this shift: No intake/output data recorded.  Examination: General exam: AAOx3,NAD, weak appearing. HEENT:Oral mucosa moist, Ear/Nose WNL grossly, dentition normal. Respiratory system: bilaterally clear,no wheezing or crackles,no use of accessory muscle Cardiovascular system: S1 & S2 +, No JVD,. Gastrointestinal system: Abdomen soft, NT,ND, BS+ Nervous System:Alert, awake, moving extremities and grossly nonfocal Extremities: No edema, distal peripheral pulses palpable.  Skin: No rashes,no icterus. MSK: Normal muscle bulk,tone, power  Data Reviewed: I have personally reviewed following labs and imaging studies CBC: Recent Labs  Lab 10/31/20 1451 11/02/20 0054 11/03/20 0403  11/04/20 0254  WBC 7.7 8.7 7.8 7.9  HGB 14.3 11.7* 13.4 13.5  HCT 45.6 36.6* 40.6 41.7  MCV 91.6 86.9 87.7 86.7  PLT 235 214 262 852   Basic Metabolic Panel: Recent Labs  Lab 10/31/20 1451 11/01/20 0205 11/02/20 0054 11/03/20 0403 11/04/20 0254  NA 137 134* 138 138 134*  K 3.5 3.8 3.8 4.0 4.0  CL 94* 96* 99 99 95*  CO2 29 25 27 27 27   GLUCOSE 123* 128* 121* 127* 123*  BUN 31* 35* 36* 38* 38*  CREATININE 1.64* 1.58* 1.92* 1.74* 1.56*  CALCIUM 9.2 9.0 9.0 9.6 9.2   GFR: Estimated Creatinine Clearance: 53.5 mL/min (A) (by C-G formula based on SCr of 1.56 mg/dL (H)). Liver Function Tests: Recent Labs  Lab 10/31/20 1604 11/02/20 0054 11/03/20 0403 11/04/20 0254  AST 31 29 27 24   ALT 32 30 31 28   ALKPHOS 113 101 111 109  BILITOT 1.5* 1.2 1.1 1.5*  PROT 7.2 6.3* 7.3 7.2  ALBUMIN 3.3* 2.9* 3.3* 3.3*   Recent Labs  Lab 10/31/20 1604  LIPASE 43   No results for input(s): AMMONIA in the last 168 hours. Coagulation Profile: No results for input(s): INR, PROTIME in the last 168 hours. Cardiac Enzymes: No results for input(s): CKTOTAL, CKMB, CKMBINDEX, TROPONINI in the last 168 hours. BNP (last 3 results) Recent Labs    01/30/20 1645  PROBNP 1,759*   HbA1C: No results for input(s): HGBA1C in the last 72 hours. CBG: No results for input(s): GLUCAP in the last 168 hours. Lipid Profile: No results for input(s): CHOL, HDL, LDLCALC, TRIG, CHOLHDL, LDLDIRECT in the last 72 hours. Thyroid Function Tests: No results for input(s): TSH, T4TOTAL, FREET4, T3FREE, THYROIDAB in the last 72 hours. Anemia Panel: No results for input(s): VITAMINB12, FOLATE, FERRITIN, TIBC, IRON, RETICCTPCT in the last 72 hours. Sepsis Labs: No results for input(s): PROCALCITON, LATICACIDVEN in the last 168 hours.  Recent Results (from the past 240 hour(s))  Respiratory Panel by RT PCR (Flu A&B, Covid) - Nasopharyngeal Swab     Status: None   Collection Time: 10/28/20  4:30 AM   Specimen:  Nasopharyngeal Swab  Result Value Ref Range Status   SARS Coronavirus 2 by RT PCR NEGATIVE NEGATIVE Final    Comment: (NOTE) SARS-CoV-2 target nucleic acids are NOT DETECTED.  The SARS-CoV-2 RNA is generally detectable in upper respiratoy specimens during the acute phase of infection. The lowest concentration of SARS-CoV-2 viral copies this assay can detect is 131 copies/mL. A negative result does not preclude SARS-Cov-2 infection and should not be used as the sole basis for treatment or other patient management decisions. A negative result may occur with  improper specimen collection/handling, submission of specimen other than nasopharyngeal swab, presence of viral mutation(s) within the areas targeted by this assay, and inadequate number of viral copies (<131 copies/mL). A negative result must be combined with clinical observations, patient history, and epidemiological information. The expected result is Negative.  Fact Sheet for Patients:  PinkCheek.be  Fact Sheet for Healthcare Providers:  GravelBags.it  This test is no t yet approved or cleared by the [place] FDA and  has been authorized for detection and/or diagnosis of SARS-CoV-2 by FDA under an Emergency Use Authorization (EUA). This EUA will remain  in effect (meaning this test can be used) for the duration of the COVID-19 declaration under Section 564(b)(1) of the Act, 21 U.S.C. section 360bbb-3(b)(1), unless the authorization is terminated or revoked sooner.     Influenza A by PCR NEGATIVE NEGATIVE Final   Influenza B by PCR NEGATIVE NEGATIVE Final    Comment: (NOTE) The Xpert Xpress SARS-CoV-2/FLU/RSV assay is intended as an aid in  the diagnosis of influenza from Nasopharyngeal swab specimens and  should not be used as a sole basis for treatment. Nasal washings and  aspirates are unacceptable for Xpert Xpress SARS-CoV-2/FLU/RSV  testing.  Fact Sheet  for Patients: PinkCheek.be  Fact Sheet for Healthcare Providers: GravelBags.it  This test is not yet approved or cleared by the [place] FDA and  has been authorized for detection and/or diagnosis of SARS-CoV-2 by  FDA under an Emergency Use Authorization (EUA). This EUA will remain  in effect (meaning this test can be used) for the duration of the  Covid-19 declaration under Section 564(b)(1) of the Act, 21  U.S.C. section 360bbb-3(b)(1), unless the authorization is  terminated or revoked. Performed at [facility], [place] [address] [place], [place] [address]      Radiology Studies: DG UGI W DOUBLE CM (HD BA)  Result Date: 11/02/2020 CLINICAL DATA:  54 year old male with epigastric abdominal pain. Possible gastric mass noted on CT the abdomen and pelvis. EXAM: UPPER GI SERIES WITH KUB TECHNIQUE: After obtaining a scout radiograph a routine upper GI series was performed using thin and high density barium. FLUOROSCOPY TIME:  Fluoroscopy Time:  3 minutes and 6 seconds Radiation Exposure Index (if provided by the fluoroscopic device): 42.3 mGy COMPARISON:  No prior.  CT the abdomen and pelvis 11/01/2020. FINDINGS: Preprocedural KUB demonstrates a nonobstructive bowel gas pattern, and is otherwise unremarkable. Double contrast images of the esophagus demonstrate a normal appearance of the esophageal mucosa. Double contrast images of the stomach were severely limited by under distension of the stomach and what appears to be retained gastric contents. With this limitation in mind, there is a persistent mass-like accumulation of barium in the antrum of the stomach which is suspicious for barium outlining either a mass, or collection of material such as a bezoar. In addition, diffuse gastric fold thickening was noted. Multiple single swallow attempts were observed which demonstrated some mild proximal escape, but otherwise  normal esophageal motility. Full column esophagram demonstrated normal esophageal anatomy. Specifically, no hiatal hernia, and no evidence of esophageal mass, stricture or esophageal ring. A barium tablet was administered, which passed readily into the stomach. IMPRESSION: 1. Limited examination with evidence of potential mass or bezoar in the region of the antrum of the stomach. Further evaluation with endoscopy is suggested to better evaluate this finding. 2. Diffuse gastric fold thickening concerning for gastritis. 3. Normal appearance of the esophagus. Electronically Signed   By: Vinnie Langton M.D.   On: 11/02/2020 14:12     LOS: 3 days   Antonieta Pert, MD [facility]  11/04/2020, 7:42 AM

## 2020-11-05 ENCOUNTER — Other Ambulatory Visit: Payer: Self-pay

## 2020-11-05 ENCOUNTER — Encounter (HOSPITAL_COMMUNITY): Payer: Self-pay | Admitting: Internal Medicine

## 2020-11-05 ENCOUNTER — Inpatient Hospital Stay (HOSPITAL_COMMUNITY): Payer: Medicaid Other | Admitting: Registered Nurse

## 2020-11-05 ENCOUNTER — Encounter (HOSPITAL_COMMUNITY)
Admission: EM | Disposition: A | Discharge status: Home / Self Care | Payer: Self-pay | Source: Home / Self Care | Attending: Internal Medicine

## 2020-11-05 DIAGNOSIS — R1084 Generalized abdominal pain: Secondary | ICD-10-CM | POA: Diagnosis present

## 2020-11-05 HISTORY — PX: ESOPHAGOGASTRODUODENOSCOPY: SHX5428

## 2020-11-05 HISTORY — PX: BIOPSY: SHX5522

## 2020-11-05 LAB — BASIC METABOLIC PANEL
Anion gap: 10 (ref 5–15)
BUN: 28 mg/dL — ABNORMAL HIGH (ref 6–20)
CO2: 29 mmol/L (ref 22–32)
Calcium: 9 mg/dL (ref 8.9–10.3)
Chloride: 96 mmol/L — ABNORMAL LOW (ref 98–111)
Creatinine, Ser: 1.6 mg/dL — ABNORMAL HIGH (ref 0.61–1.24)
GFR, Estimated: 51 mL/min — ABNORMAL LOW (ref >60)
Glucose, Bld: 96 mg/dL (ref 70–99)
Potassium: 4.3 mmol/L (ref 3.5–5.1)
Sodium: 135 mmol/L (ref 135–145)

## 2020-11-05 LAB — MAGNESIUM: Magnesium: 2.5 mg/dL — ABNORMAL HIGH (ref 1.7–2.4)

## 2020-11-05 SURGERY — EGD (ESOPHAGOGASTRODUODENOSCOPY)
Anesthesia: Monitor Anesthesia Care

## 2020-11-05 MED ORDER — PROPOFOL 10 MG/ML IV BOLUS
INTRAVENOUS | Status: DC | PRN
  Administered 2020-11-05: 40 mg via INTRAVENOUS

## 2020-11-05 MED ORDER — SODIUM CHLORIDE 0.9 % IV SOLN: INTRAVENOUS | Status: DC

## 2020-11-05 MED ORDER — PHENYLEPHRINE 40 MCG/ML (10ML) SYRINGE FOR IV PUSH (FOR BLOOD PRESSURE SUPPORT)
PREFILLED_SYRINGE | INTRAVENOUS | Status: DC | PRN
  Administered 2020-11-05: 120 ug via INTRAVENOUS
  Administered 2020-11-05: 200 ug via INTRAVENOUS

## 2020-11-05 MED ORDER — LIDOCAINE 2% (20 MG/ML) 5 ML SYRINGE
INTRAMUSCULAR | Status: DC | PRN
  Administered 2020-11-05: 50 mg via INTRAVENOUS

## 2020-11-05 MED ORDER — DEXMEDETOMIDINE (PRECEDEX) IN NS 20 MCG/5ML (4 MCG/ML) IV SYRINGE
PREFILLED_SYRINGE | INTRAVENOUS | Status: DC | PRN
  Administered 2020-11-05 (×3): 4 ug via INTRAVENOUS

## 2020-11-05 MED ORDER — LACTATED RINGERS IV SOLN
INTRAVENOUS | Status: DC
  Administered 2020-11-05: 1000 mL via INTRAVENOUS

## 2020-11-05 MED ORDER — PANTOPRAZOLE SODIUM 40 MG IV SOLR
40.0000 mg | Freq: Two times a day (BID) | INTRAVENOUS | Status: DC
  Administered 2020-11-05 – 2020-11-06 (×3): 40 mg via INTRAVENOUS
  Filled 2020-11-05 (×3): qty 40

## 2020-11-05 MED ORDER — PROPOFOL 500 MG/50ML IV EMUL
INTRAVENOUS | Status: DC | PRN
  Administered 2020-11-05: 100 ug/kg/min via INTRAVENOUS

## 2020-11-05 MED ORDER — IVABRADINE HCL 5 MG PO TABS
5.0000 mg | ORAL_TABLET | Freq: Two times a day (BID) | ORAL | Status: DC
  Administered 2020-11-05 – 2020-11-06 (×3): 5 mg via ORAL
  Filled 2020-11-05 (×4): qty 1

## 2020-11-05 NOTE — Interval H&P Note (Signed)
History and Physical Interval Note:  11/05/2020 8:30 AM  Tyler Wong  has presented today for surgery, with the diagnosis of abnormal upper GI series (possible gastric mass).  The various methods of treatment have been discussed with the patient and family. After consideration of risks, benefits and other options for treatment, the patient has consented to  Procedure(s): ESOPHAGOGASTRODUODENOSCOPY (EGD) (N/A) as a surgical intervention.  The patient's history has been reviewed, patient examined, no change in status, stable for surgery.  I have reviewed the patient's chart and labs.  Questions were answered to the patient's satisfaction.     [name]

## 2020-11-05 NOTE — Brief Op Note (Signed)
Complicated distal stomach ulcers - no gastric mass seen. Biopsies taken of inflamed stomach. See endopro note for complete findings and recs. Changed to IV PPI Q 12 hours. When discharged needs to be on a PPI BID for 3 months and then QD. F/U on path to check for malignancy and H. Pylori. [facility] will f/u tomorrow.

## 2020-11-05 NOTE — Anesthesia Procedure Notes (Signed)
Procedure Name: MAC Date/Time: 11/05/2020 8:30 AM Performed by: Leonor Liv, CRNA Pre-anesthesia Checklist: Patient identified, Emergency Drugs available, Suction available, Patient being monitored and Timeout performed Patient Re-evaluated:Patient Re-evaluated prior to induction Oxygen Delivery Method: Nasal cannula Airway Equipment and Method: Bite block Placement Confirmation: positive ETCO2 Dental Injury: Teeth and Oropharynx as per pre-operative assessment

## 2020-11-05 NOTE — Transfer of Care (Signed)
Immediate Anesthesia Transfer of Care Note  Patient: Tyler Wong  Procedure(s) Performed: ESOPHAGOGASTRODUODENOSCOPY (EGD) (N/A ) BIOPSY  Patient Location: Endoscopy Unit  Anesthesia Type:MAC  Level of Consciousness: sedated  Airway & Oxygen Therapy: Patient Spontanous Breathing and Patient connected to nasal cannula oxygen  Post-op Assessment: Report given to RN and Post -op Vital signs reviewed and stable  Post vital signs: Reviewed and stable  Last Vitals:  Vitals Value Taken Time  BP 95/73 11/05/20 0855  Temp    Pulse 100 11/05/20 0856  Resp 30 11/05/20 0856  SpO2 100 % 11/05/20 0856  Vitals shown include unvalidated device data.  Last Pain:  Vitals:   11/05/20 0752  TempSrc: Oral  PainSc: 0-No pain      Patients Stated Pain Goal: 0 (71/21/97 5883)  Complications: No complications documented.

## 2020-11-05 NOTE — Anesthesia Preprocedure Evaluation (Addendum)
Anesthesia Evaluation  Patient identified by MRN, date of birth, ID band Patient awake    Reviewed: Allergy & Precautions, H&P , NPO status , Patient's Chart, lab work & pertinent test results  Airway Mallampati: II   Neck ROM: full    Dental   Pulmonary shortness of breath, COPD,  COPD inhaler, Current Smoker,    breath sounds clear to auscultation       Cardiovascular hypertension, Pt. on home beta blockers + CAD, + Past MI, + Cardiac Stents and +CHF   Rhythm:regular Rate:Normal  10/8 TTE 1. 3 D images of the LV were obtained. . Left ventricular ejection  fraction, by estimation, is 20 to 25%. The left ventricle has severely  decreased function. The left ventricle demonstrates global hypokinesis.  The left ventricular internal cavity size  was moderately dilated. There is mild left ventricular hypertrophy. Left  ventricular diastolic parameters are consistent with Grade II diastolic  dysfunction (pseudonormalization).  2. Right ventricular systolic function is low normal. The right  ventricular size is normal. There is moderately elevated pulmonary artery  systolic pressure. The estimated right ventricular systolic pressure is  16.1 mmHg.    Neuro/Psych    GI/Hepatic GERD  ,(+)     substance abuse  cocaine use, Hepatitis -  Endo/Other    Renal/GU Renal InsufficiencyRenal diseaseK+ 4.3 Cr 1.60     Musculoskeletal negative musculoskeletal ROS (+)   Abdominal   Peds  Hematology  (+) Blood dyscrasia, anemia , Hgb 13.5 plt 253   Anesthesia Other Findings   Reproductive/Obstetrics                            Anesthesia Physical Anesthesia Plan  ASA: IV  Anesthesia Plan: MAC   Post-op Pain Management:    Induction: Intravenous  PONV Risk Score and Plan: Propofol infusion and Treatment may vary due to age or medical condition  Airway Management Planned: Nasal Cannula  Additional  Equipment:   Intra-op Plan:   Post-operative Plan:   Informed Consent: I have reviewed the patients History and Physical, chart, labs and discussed the procedure including the risks, benefits and alternatives for the proposed anesthesia with the patient or authorized representative who has indicated his/her understanding and acceptance.     Dental advisory given  Plan Discussed with: CRNA, Anesthesiologist and Surgeon  Anesthesia Plan Comments:        Anesthesia Quick Evaluation

## 2020-11-05 NOTE — Progress Notes (Signed)
Patient ID: Tyler Wong, male   DOB: 12-06-1966, 54 y.o.   MRN: [id number]     Advanced Heart Failure Rounding Note  PCP-Cardiologist: Shelva Majestic, MD   Subjective:    No dyspnea this morning.  Back on po torsemide.  NPO for EGD.  Creatinine stable 1.6.   Objective:   Weight Range: 69.7 kg Body mass index is 21.74 kg/m.   Vital Signs:   Temp:  [97.7 F (36.5 C)-98.5 F (36.9 C)] 98.5 F (36.9 C) (11/09 0542) Pulse Rate:  [91-108] 91 (11/09 0542) Resp:  [17-18] 18 (11/09 0542) BP: (92-111)/(63-74) 95/69 (11/09 0542) SpO2:  [95 %-98 %] 96 % (11/09 0542) Weight:  [69.7 kg] 69.7 kg (11/09 0542) Last BM Date: 11/04/20  Weight change: Filed Weights   11/03/20 0511 11/04/20 0247 11/05/20 0542  Weight: 72.4 kg 69.9 kg 69.7 kg    Intake/Output:   Intake/Output Summary (Last 24 hours) at 11/05/2020 0729 Last data filed at 11/04/2020 2000 Gross per 24 hour  Intake 1118 ml  Output 100 ml  Net 1018 ml      Physical Exam    General: NAD Neck: JVP 8 cm, no thyromegaly or thyroid nodule.  Lungs: Clear to auscultation bilaterally with normal respiratory effort. CV: Lateral PMI.  Heart mildly tachy, regular S1/S2, no S3/S4, 2/6 HSM apex.  No peripheral edema.   Abdomen: Soft, nontender, no hepatosplenomegaly, no distention.  Skin: Intact without lesions or rashes.  Neurologic: Alert and oriented x 3.  Psych: Normal affect. Extremities: No clubbing or cyanosis.  HEENT: Normal.    Telemetry   ST 100s with PVCs   EKG    N/a   Labs    CBC Recent Labs    11/03/20 0403 11/04/20 0254  WBC 7.8 7.9  HGB 13.4 13.5  HCT 40.6 41.7  MCV 87.7 86.7  PLT 262 518   Basic Metabolic Panel Recent Labs    11/04/20 0254 11/05/20 0400  NA 134* 135  K 4.0 4.3  CL 95* 96*  CO2 27 29  GLUCOSE 123* 96  BUN 38* 28*  CREATININE 1.56* 1.60*  CALCIUM 9.2 9.0  MG  --  2.5*   Liver Function Tests Recent Labs    11/03/20 0403 11/04/20 0254  AST 27 24  ALT 31 28    ALKPHOS 111 109  BILITOT 1.1 1.5*  PROT 7.3 7.2  ALBUMIN 3.3* 3.3*   No results for input(s): LIPASE, AMYLASE in the last 72 hours. Cardiac Enzymes No results for input(s): CKTOTAL, CKMB, CKMBINDEX, TROPONINI in the last 72 hours.  BNP: BNP (last 3 results) Recent Labs    09/21/20 0138 10/03/20 1222 10/31/20 1604  BNP 578.1* 2,190.9* 1,322.2*    ProBNP (last 3 results) Recent Labs    01/30/20 1645  PROBNP 1,759*     D-Dimer No results for input(s): DDIMER in the last 72 hours. Hemoglobin A1C No results for input(s): HGBA1C in the last 72 hours. Fasting Lipid Panel No results for input(s): CHOL, HDL, LDLCALC, TRIG, CHOLHDL, LDLDIRECT in the last 72 hours. Thyroid Function Tests No results for input(s): TSH, T4TOTAL, T3FREE, THYROIDAB in the last 72 hours.  Invalid input(s): FREET3  Other results:   Imaging    No results found.   Medications:     Scheduled Medications: . aspirin EC  81 mg Oral Daily  . atorvastatin  80 mg Oral Daily  . digoxin  0.125 mg Oral Daily  . empagliflozin  10 mg Oral Daily  .  enoxaparin (LOVENOX) injection  40 mg Subcutaneous Q24H  . isosorbide-hydrALAZINE  1 tablet Oral TID  . multivitamin with minerals  1 tablet Oral Daily  . pantoprazole  40 mg Oral Daily  . potassium chloride SA  20 mEq Oral Daily  . sodium chloride flush  3 mL Intravenous Q12H  . spironolactone  25 mg Oral Daily  . torsemide  100 mg Oral Daily    Infusions: . sodium chloride      PRN Medications: sodium chloride, acetaminophen, ipratropium, ondansetron (ZOFRAN) IV, sodium chloride flush    Assessment/Plan   1.Acute on ChronicSystolic TL:XBWI in 2035 with EF 40-45%. Cath in 10/19 showed 90% ostial OM1 and 70% PLV, no interventional target. Suspect mixed ischemic/nonischemic cardiomyopathy.Recent Echo 10/21 showed EF 20-25%with mild RV dysfunction.He has a strong family history of CHF/cardiomyopathy, so may be a component of familial  cardiomyopathy.Cocaine likely plays a role in his cardiomyopathy.Multiple re-admits forcardiogenic shockin the last 6 months requiring milrinone. Unfortunately, not a candidate for advanced therapies or home milrinone with active use of cocaine and current social situation homeless(SW following).  Volume status improved with IV diuresis here, back on po torsemide.  - Continue torsemide 100 mg daily - Continue Bidil 1 tablet TID (monitor BP) - Continue digoxin 0.125.  - Continue Jardiance 10 mg daily  - BP too soft for ARNi  - Continue spironolactone 25 mg daily.  -  no ? blocker given h/o low output but with mild sinus tachy will add ivabradine 5 mg bid.  -  Palliative care consult recommended, but pt adamantly declines.  2. Mitral Regurgitation: Moderate to severeon recent echo, suspectfunctional MR from dilated CMP.  3. CAD: H/o anterior STEMI with DES to LAD in 7/15.Cath in 10/19 with 90% ostial OM1 and 70% PLV, no interventional target.No recent CP/ACS.  -ContinueASA 81 and atorvastatin 80 mg daily - needs to refrain from using cocaine 4.Stage III CKD: Baseline SCr 1.5, stable at 1.6 today.  5. Abdominal Pain/ Abnormal Abdominal CT: Have suspected abdominal pain be be 2/2 low output HF, but CT shows a mass-like area in the antral pre-pyloric region of the stomach. This may simply represent ingested food, however, cannot r/o the possibility of a primary gastric neoplasm.  - EGD today per GI.  6. Ongoing Cocaine abuse:UDS +  - He would like inpatient rehab for ongoing substance abuse, per [facility]. 7.RecentCOVID Infection: Tested + 09/19/20  At d/c he will need all medications. He was given  meds on 10/09/20 at his last discharge but he has lost them.    Length of Stay: 4  Loralie Champagne, MD  11/05/2020, 7:29 AM  Advanced Heart Failure Team Pager [phone] (M-F; 7a - 4p)  Please contact [facility] for night-coverage after hours (4p -7a ) and weekends on [email or web address]

## 2020-11-05 NOTE — Progress Notes (Signed)
PROGRESS NOTE    Tyler Wong  KWI:[id number] DOB: 10/23/1966 DOA: 10/31/2020 PCP: [name], NP   Chief Complaint  Patient presents with  . Chest Pain  . Shortness of Breath    Brief Narrative: 54 y.o. male with medical history significant of advanced chronic systolic CHF with LVEF 20 to 25%, cocaine abuse, CKD stage II, presented with worsening of fluid retention. Patient been taking 80 mg torsemide twice daily for chronic systolic CHF. On and off, he noticed fluid retention around his ankles, and " feeling congested in my stomach, very easy to get food after eating any food", as educated by CHF team, has been taking extra dose of torsemide but he started to feel congestion and usually prevent fluid retention to become worse. However in the last 2 days, despite increasing his torsemide from 80 mg twice daily to 100 mg twice daily, I do not see increased urine output and meantime he started to feel increasing shortness of breath and again "bloated stomach". He claimed that he still use cocaine and the last time he used it was more than 2 weeks ago. And he also claims he has been compliant with fluid intake of less than 2000 mL/day and watch sodium intake. He denied any chest pain, no cough, no diarrhea, no fever or chills.  ED Course: X-ray shows cardiomegaly but no significant pulmonary congestion, troponin 220>190. Patient is admitted, seen by cardiology given his fluid overload placed on IV Lasix drip. Patient was started on IV Lasix drip 10 mg/h had uptrending creatinine so Lasix decreased to 8 mg/h 11/6  Subjective: Seen this morning.  His sister is at the bedside. Just came back from endoscopy and about to have his meal Shortness of breath much better.  On room air. Assessment & Plan:  Acute on chronic systolic CHF/mitral regurgitation moderate to severe in recent echo-likely from dilated CM: CHF exacerbation in the setting of noncompliance, substance abuse.  Seen by  cardiology-not a candidate for advanced therapy.  Palliative care approach has been recommended by cardiology but patient has adamantly refused.  Diuresed well on Lasix drip and switched to torsemide 100 mg, continue the same continue BiDil 1 tablet 3 times daily, monitor blood pressure, digoxin 0.125 mg, Jardiance 10 mg daily repeat discharge 5 ARN I, continue Aldactone 25 mg daily no beta-blocker due to low output, added ivabradine 5 mg twice daily with mild sinus tachycardia.  Patient will need all his medication upon discharge  Complicated stomach ulcer: Presented with abdominal bloating and had abnormal CT abdomen pelvis, masslike area in the antral prepyloric region, upper GI shows potential mass or bezoar in the region of the antrum of the stomach, seen by GI, underwent EGD found to have complicated stomach ulcer added Protonix twice daily, diet as tolerated   Elevated troponin flat in the setting of CHF,cocaine use./CAD history of anterior ST elevation MI with DES in LAD 11/15 cath 10/19 with 90% ostial OM1 and 70% PLV, no intervention targeted, continue aspirin 81 atorvastatin 80 mg.  Cocaine abuse patient denied using cocaine recently but was more than a month ago.  Urine drug screen noted.  Counseling.  AKI on CKD stage IIIa: Creatinine up trended to 1.9, now nicely downtrending at 1.5.  Monitor closely while on diuretics.  Recent Labs  Lab 11/01/20 0205 11/02/20 0054 11/03/20 0403 11/04/20 0254 11/05/20 0400  BUN 35* 36* 38* 38* 28*  CREATININE 1.58* 1.92* 1.74* 1.56* 1.60*   Recent Covid infection 09/19/2020  Nutrition:  Diet Order            DIET SOFT Room service appropriate? Yes; Fluid consistency: Thin  Diet effective now                 Body mass index is 22.05 kg/m.  DVT prophylaxis: enoxaparin (LOVENOX) injection 40 mg Start: 10/31/20 2030 Code Status:   Code Status: Full Code  Family Communication: plan of care discussed with patient at bedside.  Status is:  Admitted as observation  Patient remains to be hospitalized due to ongoing management of CHF as well as abnormal CT finding and abdominal bloating   Dispo: The patient is from: Home              Anticipated d/c is to: Home              Anticipated d/c date is: Tomorrow once okay with cardiology and GI.              Patient currently is not medically stable to d/c. Consultants:see note  Procedures:see note  Culture/Microbiology  Medications: Scheduled Meds: . aspirin EC  81 mg Oral Daily  . atorvastatin  80 mg Oral Daily  . digoxin  0.125 mg Oral Daily  . empagliflozin  10 mg Oral Daily  . enoxaparin (LOVENOX) injection  40 mg Subcutaneous Q24H  . isosorbide-hydrALAZINE  1 tablet Oral TID  . ivabradine  5 mg Oral BID WC  . multivitamin with minerals  1 tablet Oral Daily  . pantoprazole (PROTONIX) IV  40 mg Intravenous Q12H  . potassium chloride SA  20 mEq Oral Daily  . sodium chloride flush  3 mL Intravenous Q12H  . spironolactone  25 mg Oral Daily  . torsemide  100 mg Oral Daily   Continuous Infusions: . sodium chloride    . lactated ringers 1,000 mL (11/05/20 0805)    Antimicrobials: Anti-infectives (From admission, onward)   None     Objective: Vitals: Today's Vitals   11/05/20 0752 11/05/20 0855 11/05/20 0905 11/05/20 0917  BP: 98/76 95/73 93/75  97/77  Pulse: (!) 103 (!) 105 (!) 104 94  Resp: (!) 26 (!) 30 (!) 24 15  Temp: 97.9 F (36.6 C) 97.7 F (36.5 C)    TempSrc: Oral Axillary    SpO2: 99% 100% 100% 99%  Weight: 69.7 kg     Height: 5\' 10"  (1.778 m)     PainSc: 0-No pain  0-No pain 0-No pain    Intake/Output Summary (Last 24 hours) at 11/05/2020 1419 Last data filed at 11/05/2020 0852 Gross per 24 hour  Intake 480 ml  Output --  Net 480 ml   Filed Weights   11/04/20 0247 11/05/20 0542 11/05/20 0752  Weight: 69.9 kg 69.7 kg 69.7 kg   Weight change: -0.227 kg  Intake/Output from previous day: 11/08 0701 - 11/09 0700 In: 1118 [P.O.:1118] Out:  100 [Urine:100] Intake/Output this shift: Total I/O In: 200 [I.V.:200] Out: -   Examination: General exam: AAOx3 , NAD, weak appearing. HEENT:Oral mucosa moist, Ear/Nose WNL grossly, dentition normal. Respiratory system: bilaterally clear,no wheezing or crackles,no use of accessory muscle Cardiovascular system: S1 & S2 +, No JVD,. Gastrointestinal system: Abdomen soft, NT,ND, BS+ Nervous System:Alert, awake, moving extremities and grossly nonfocal Extremities: No edema, distal peripheral pulses palpable.  Skin: No rashes,no icterus. MSK: Normal muscle bulk,tone, power  Data Reviewed: I have personally reviewed following labs and imaging studies CBC: Recent Labs  Lab 10/31/20 1451 11/02/20 0054 11/03/20 0403 11/04/20 0254  WBC 7.7 8.7 7.8 7.9  HGB 14.3 11.7* 13.4 13.5  HCT 45.6 36.6* 40.6 41.7  MCV 91.6 86.9 87.7 86.7  PLT 235 214 262 767   Basic Metabolic Panel: Recent Labs  Lab 11/01/20 0205 11/02/20 0054 11/03/20 0403 11/04/20 0254 11/05/20 0400  NA 134* 138 138 134* 135  K 3.8 3.8 4.0 4.0 4.3  CL 96* 99 99 95* 96*  CO2 25 27 27 27 29   GLUCOSE 128* 121* 127* 123* 96  BUN 35* 36* 38* 38* 28*  CREATININE 1.58* 1.92* 1.74* 1.56* 1.60*  CALCIUM 9.0 9.0 9.6 9.2 9.0  MG  --   --   --   --  2.5*   GFR: Estimated Creatinine Clearance: 52 mL/min (A) (by C-G formula based on SCr of 1.6 mg/dL (H)). Liver Function Tests: Recent Labs  Lab 10/31/20 1604 11/02/20 0054 11/03/20 0403 11/04/20 0254  AST 31 29 27 24   ALT 32 30 31 28   ALKPHOS 113 101 111 109  BILITOT 1.5* 1.2 1.1 1.5*  PROT 7.2 6.3* 7.3 7.2  ALBUMIN 3.3* 2.9* 3.3* 3.3*   Recent Labs  Lab 10/31/20 1604  LIPASE 43   No results for input(s): AMMONIA in the last 168 hours. Coagulation Profile: No results for input(s): INR, PROTIME in the last 168 hours. Cardiac Enzymes: No results for input(s): CKTOTAL, CKMB, CKMBINDEX, TROPONINI in the last 168 hours. BNP (last 3 results) Recent Labs     01/30/20 1645  PROBNP 1,759*   HbA1C: No results for input(s): HGBA1C in the last 72 hours. CBG: No results for input(s): GLUCAP in the last 168 hours. Lipid Profile: No results for input(s): CHOL, HDL, LDLCALC, TRIG, CHOLHDL, LDLDIRECT in the last 72 hours. Thyroid Function Tests: No results for input(s): TSH, T4TOTAL, FREET4, T3FREE, THYROIDAB in the last 72 hours. Anemia Panel: No results for input(s): VITAMINB12, FOLATE, FERRITIN, TIBC, IRON, RETICCTPCT in the last 72 hours. Sepsis Labs: No results for input(s): PROCALCITON, LATICACIDVEN in the last 168 hours.  Recent Results (from the past 240 hour(s))  Respiratory Panel by RT PCR (Flu A&B, Covid) - Nasopharyngeal Swab     Status: None   Collection Time: 10/28/20  4:30 AM   Specimen: Nasopharyngeal Swab  Result Value Ref Range Status   SARS Coronavirus 2 by RT PCR NEGATIVE NEGATIVE Final    Comment: (NOTE) SARS-CoV-2 target nucleic acids are NOT DETECTED.  The SARS-CoV-2 RNA is generally detectable in upper respiratoy specimens during the acute phase of infection. The lowest concentration of SARS-CoV-2 viral copies this assay can detect is 131 copies/mL. A negative result does not preclude SARS-Cov-2 infection and should not be used as the sole basis for treatment or other patient management decisions. A negative result may occur with  improper specimen collection/handling, submission of specimen other than nasopharyngeal swab, presence of viral mutation(s) within the areas targeted by this assay, and inadequate number of viral copies (<131 copies/mL). A negative result must be combined with clinical observations, patient history, and epidemiological information. The expected result is Negative.  Fact Sheet for Patients:  PinkCheek.be  Fact Sheet for Healthcare Providers:  GravelBags.it  This test is no t yet approved or cleared by the [place] FDA and   has been authorized for detection and/or diagnosis of SARS-CoV-2 by FDA under an Emergency Use Authorization (EUA). This EUA will remain  in effect (meaning this test can be used) for the duration of the COVID-19 declaration under Section 564(b)(1) of the Act, 21 U.S.C. section  360bbb-3(b)(1), unless the authorization is terminated or revoked sooner.     Influenza A by PCR NEGATIVE NEGATIVE Final   Influenza B by PCR NEGATIVE NEGATIVE Final    Comment: (NOTE) The Xpert Xpress SARS-CoV-2/FLU/RSV assay is intended as an aid in  the diagnosis of influenza from Nasopharyngeal swab specimens and  should not be used as a sole basis for treatment. Nasal washings and  aspirates are unacceptable for Xpert Xpress SARS-CoV-2/FLU/RSV  testing.  Fact Sheet for Patients: PinkCheek.be  Fact Sheet for Healthcare Providers: GravelBags.it  This test is not yet approved or cleared by the [place] FDA and  has been authorized for detection and/or diagnosis of SARS-CoV-2 by  FDA under an Emergency Use Authorization (EUA). This EUA will remain  in effect (meaning this test can be used) for the duration of the  Covid-19 declaration under Section 564(b)(1) of the Act, 21  U.S.C. section 360bbb-3(b)(1), unless the authorization is  terminated or revoked. Performed at [facility], [place] [address], [place], [place] [address]      Radiology Studies: No results found.   LOS: 4 days   Antonieta Pert, MD [facility]  11/05/2020, 2:19 PM

## 2020-11-05 NOTE — Op Note (Signed)
[facility] Patient Name: Tyler Wong Procedure Date : 11/05/2020 MRN: [id number] Attending MD: [name] , MD Date of Birth: 04/15/1966 CSN: [id number] Age: 54 Admit Type: Inpatient Procedure:                Upper GI endoscopy Indications:              Generalized abdominal pain, Abnormal UGI series,                            Abnormal CT of the GI tract Providers:                [name], MD, Particia Nearing, RN, Elspeth Cho Tech., Technician, Lesia Sago,                            Technician, Trixie Deis, CRNA Referring MD:             hospital team Medicines:                Propofol per Anesthesia, Monitored Anesthesia Care Complications:            No immediate complications. Estimated Blood Loss:     Estimated blood loss was minimal. Procedure:                Pre-Anesthesia Assessment:                           - Prior to the procedure, a History and Physical                            was performed, and patient medications and                            allergies were reviewed. The patient's tolerance of                            previous anesthesia was also reviewed. The risks                            and benefits of the procedure and the sedation                            options and risks were discussed with the patient.                            All questions were answered, and informed consent                            was obtained. Prior Anticoagulants: The patient has                            taken no previous anticoagulant or antiplatelet  agents. ASA Grade Assessment: IV - A patient with                            severe systemic disease that is a constant threat                            to life. After reviewing the risks and benefits,                            the patient was deemed in satisfactory condition to                            undergo the procedure.                            After obtaining informed consent, the endoscope was                            passed under direct vision. Throughout the                            procedure, the patient's blood pressure, pulse, and                            oxygen saturations were monitored continuously. The                            GIF-H190 ([id number]) Olympus gastroscope was                            introduced through the mouth, and advanced to the                            second part of duodenum. The upper GI endoscopy was                            accomplished without difficulty. The patient                            tolerated the procedure well. Scope In: Scope Out: Findings:      Patchy, white plaques were found in the distal esophagus.      The Z-line was regular and was found 40 cm from the incisors.      Three non-bleeding cratered gastric ulcers with pigmented material were       found in the gastric antrum. The largest lesion was 10 mm in largest       dimension. Biopsies were taken with a cold forceps for histology.       Estimated blood loss was minimal.      Segmental moderate inflammation characterized by congestion (edema),       erosions and erythema was found in the gastric antrum. Biopsies were       taken with a cold forceps for histology. Estimated blood loss was       minimal.      The cardia  and gastric fundus were normal on retroflexion.      The examined duodenum was normal. Impression:               - Esophageal plaques were found, suspicious for                            candidiasis.                           - Z-line regular, 40 cm from the incisors.                           - Non-bleeding gastric ulcers with pigmented                            material. Biopsied.                           - Gastritis. Biopsied.                           - Normal examined duodenum. Recommendation:           - Soft diet.                           - Await pathology results.                            - Observe patient's clinical course. Procedure Code(s):        --- Professional ---                           [phone], Esophagogastroduodenoscopy, flexible,                            transoral; with biopsy, single or multiple Diagnosis Code(s):        --- Professional ---                           K25.9, Gastric ulcer, unspecified as acute or                            chronic, without hemorrhage or perforation                           K29.70, Gastritis, unspecified, without bleeding                           R10.84, Generalized abdominal pain                           K22.9, Disease of esophagus, unspecified                           R93.3, Abnormal findings on diagnostic imaging of  other parts of digestive tract CPT copyright 2019 American Medical Association. All rights reserved. The codes documented in this report are preliminary and upon coder review may  be revised to meet current compliance requirements. [name], MD 11/05/2020 9:03:43 AM This report has been signed electronically. Number of Addenda: 0

## 2020-11-06 ENCOUNTER — Other Ambulatory Visit: Payer: Self-pay

## 2020-11-06 ENCOUNTER — Other Ambulatory Visit: Payer: Self-pay | Admitting: Physician Assistant

## 2020-11-06 ENCOUNTER — Encounter (HOSPITAL_COMMUNITY): Payer: Self-pay | Admitting: Gastroenterology

## 2020-11-06 LAB — BASIC METABOLIC PANEL
Anion gap: 11 (ref 5–15)
BUN: 35 mg/dL — ABNORMAL HIGH (ref 6–20)
CO2: 27 mmol/L (ref 22–32)
Calcium: 9.1 mg/dL (ref 8.9–10.3)
Chloride: 98 mmol/L (ref 98–111)
Creatinine, Ser: 2.04 mg/dL — ABNORMAL HIGH (ref 0.61–1.24)
GFR, Estimated: 38 mL/min — ABNORMAL LOW (ref >60)
Glucose, Bld: 120 mg/dL — ABNORMAL HIGH (ref 70–99)
Potassium: 4.3 mmol/L (ref 3.5–5.1)
Sodium: 136 mmol/L (ref 135–145)

## 2020-11-06 MED ORDER — ISOSORB DINITRATE-HYDRALAZINE 20-37.5 MG PO TABS: 1.0000 | ORAL_TABLET | Freq: Three times a day (TID) | ORAL | 1 refills | Status: DC

## 2020-11-06 MED ORDER — DIGOXIN 125 MCG PO TABS: 0.1250 mg | ORAL_TABLET | Freq: Every day | ORAL | Status: DC

## 2020-11-06 MED ORDER — ATORVASTATIN CALCIUM 80 MG PO TABS: 80.0000 mg | ORAL_TABLET | Freq: Every day | ORAL | 1 refills | Status: DC

## 2020-11-06 MED ORDER — POTASSIUM CHLORIDE CRYS ER 20 MEQ PO TBCR: 20.0000 meq | EXTENDED_RELEASE_TABLET | Freq: Every day | ORAL | 1 refills | Status: DC

## 2020-11-06 MED ORDER — ASPIRIN 81 MG PO TBEC: 81.0000 mg | DELAYED_RELEASE_TABLET | Freq: Every day | ORAL | 1 refills | Status: DC

## 2020-11-06 MED ORDER — PANTOPRAZOLE SODIUM 40 MG PO TBEC: 40.0000 mg | DELAYED_RELEASE_TABLET | Freq: Two times a day (BID) | ORAL | 1 refills | Status: DC

## 2020-11-06 MED ORDER — IVABRADINE HCL 5 MG PO TABS: 5.0000 mg | ORAL_TABLET | Freq: Two times a day (BID) | ORAL | 0 refills | Status: DC

## 2020-11-06 MED ORDER — TORSEMIDE 20 MG PO TABS
ORAL_TABLET | ORAL | 0 refills | Status: DC
Start: 2020-11-06 — End: 2020-11-26

## 2020-11-06 MED ORDER — TORSEMIDE 20 MG PO TABS: 40.0000 mg | ORAL_TABLET | Freq: Every evening | ORAL | Status: DC

## 2020-11-06 MED ORDER — POTASSIUM CHLORIDE CRYS ER 20 MEQ PO TBCR: 20.0000 meq | EXTENDED_RELEASE_TABLET | Freq: Every day | ORAL | Status: DC

## 2020-11-06 MED ORDER — TORSEMIDE 20 MG PO TABS: 80.0000 mg | ORAL_TABLET | Freq: Every day | ORAL | Status: DC

## 2020-11-06 MED ORDER — DIGOXIN 125 MCG PO TABS: 0.1250 mg | ORAL_TABLET | Freq: Every day | ORAL | 0 refills | Status: DC

## 2020-11-06 MED ORDER — EMPAGLIFLOZIN 10 MG PO TABS: 10.0000 mg | ORAL_TABLET | Freq: Every day | ORAL | 1 refills | Status: DC

## 2020-11-06 MED ORDER — SPIRONOLACTONE 25 MG PO TABS: 25.0000 mg | ORAL_TABLET | Freq: Every day | ORAL | 1 refills | Status: DC

## 2020-11-06 MED FILL — CORLANOR 5 MG TABLET: 5 | 30 days supply | Qty: 60 | Fill #0

## 2020-11-06 MED FILL — TORSEMIDE 20 MG TABLET: 20 | 30 days supply | Qty: 180 | Fill #0

## 2020-11-06 MED FILL — PANTOPRAZOLE SOD DR 40 MG T: 40 | 15 days supply | Qty: 30 | Fill #0

## 2020-11-06 MED FILL — BIDIL 20-37.5 MG TABS: 20-37.5 | 10 days supply | Qty: 30 | Fill #0

## 2020-11-06 MED FILL — POTASSIUM CHLORIDE 20meqER: 20 | 30 days supply | Qty: 30 | Fill #0

## 2020-11-06 MED FILL — DIGOXIN 0.125 MG TABLET: 125 | 30 days supply | Qty: 30 | Fill #0

## 2020-11-06 MED FILL — ASPIRIN LOW DOSE 81 MG TBEC: 81 | 30 days supply | Qty: 30 | Fill #0

## 2020-11-06 MED FILL — ATORVASTATIN CALCIUM 80 MG: 80 | 30 days supply | Qty: 30 | Fill #0

## 2020-11-06 MED FILL — JARDIANCE 10 MG TABLET: 10 | 14 days supply | Qty: 14 | Fill #0

## 2020-11-06 MED FILL — SPIRONOLACTONE 25 MG TABLET: 25 | 30 days supply | Qty: 30 | Fill #0

## 2020-11-06 NOTE — Plan of Care (Signed)

## 2020-11-06 NOTE — Progress Notes (Signed)
[facility]enterology Progress Note  Tyler Wong 54 y.o. [birth year]  CC: Gastric ulcer   Subjective: Patient seen and examined at bedside.  Feeling much better.  Denies any blood in the stool or black stool.  Denies nausea or vomiting.  Wants to go home.  Endoscopy findings from yesterday's EGD reviewed and discussed with the patient.  ROS : Afebrile, negative for nausea and vomiting.   Objective: Vital signs in last 24 hours: Vitals:   11/06/20 0000 11/06/20 0606  BP: 107/82 99/81  Pulse: 99 93  Resp: 18 17  Temp: 98 F (36.7 C) 98.4 F (36.9 C)  SpO2: 93% 95%    Physical Exam:  General:  Alert, cooperative, no distress, appears stated age  Head:  Normocephalic, without obvious abnormality, atraumatic  Eyes:  , EOM's intact,   Lungs:    No visible respiratory distress  Heart:  Regular rate and rhythm, S1, S2 normal  Abdomen:   Soft, non-tender, nondistended, bowel sounds present no peritoneal signs no masses,   Extremities: Extremities normal, atraumatic, no  edema       Lab Results: Recent Labs    11/05/20 0400 11/06/20 0149  NA 135 136  K 4.3 4.3  CL 96* 98  CO2 29 27  GLUCOSE 96 120*  BUN 28* 35*  CREATININE 1.60* 2.04*  CALCIUM 9.0 9.1  MG 2.5*  --    Recent Labs    11/04/20 0254  AST 24  ALT 28  ALKPHOS 109  BILITOT 1.5*  PROT 7.2  ALBUMIN 3.3*   Recent Labs    11/04/20 0254  WBC 7.9  HGB 13.5  HCT 41.7  MCV 86.7  PLT 253   No results for input(s): LABPROT, INR in the last 72 hours.    Assessment/Plan: -Complicated gastric ulcers.  Biopsies pending. -Scattered white patch in esophagus.  Biopsies pending. -Acute on chronic CHF  Recommendations ------------------------- -Advance diet as tolerated. -Recommend p.o. Protonix 40 mg twice a day for next 3 months. -Avoid NSAIDs.  Avoid smoking. -Follow-up with Dr. Michail Sermon in 4 to 6 weeks.  Recommend repeat EGD in 2 to 3 months to document healing of gastric ulcers. -Okay to  discharge from GI standpoint.  GI will sign off.  Call us back if needed   Otis Brace MD, [place] 11/06/2020, 8:46 AM  Contact #  [phone]

## 2020-11-06 NOTE — Progress Notes (Addendum)
Patient ID: Tyler Wong, male   DOB: [birth year], 54 y.o.   MRN: [id number]     Advanced Heart Failure Rounding Note  PCP-Cardiologist: Shelva Majestic, MD   Subjective:    EGD on 01/0 with complicated distal stomach ulcers, started on bid PPI.   No complaints this morning, no dyspnea.  Creatinine up to 2.  Wants to go home.   Objective:    Weight Range: 70.5 kg Body mass index is 22.3 kg/m.   Vital Signs:   Temp:  [97.7 F (36.5 C)-98.4 F (36.9 C)] 98.4 F (36.9 C) (11/10 0606) Pulse Rate:  [93-105] 93 (11/10 0606) Resp:  [15-30] 17 (11/10 0606) BP: (93-107)/(65-82) 99/81 (11/10 0606) SpO2:  [93 %-100 %] 95 % (11/10 0606) Weight:  [69.7 kg-70.5 kg] 70.5 kg (11/10 0606) Last BM Date: 11/03/20  Weight change: Filed Weights   11/05/20 0542 11/05/20 0752 11/06/20 0606  Weight: 69.7 kg 69.7 kg 70.5 kg    Intake/Output:   Intake/Output Summary (Last 24 hours) at 11/06/2020 0737 Last data filed at 11/05/2020 2000 Gross per 24 hour  Intake 560 ml  Output --  Net 560 ml      Physical Exam    General: NAD Neck: JVP 8 cm, no thyromegaly or thyroid nodule.  Lungs: Clear to auscultation bilaterally with normal respiratory effort. CV: Lateral PMI.  Heart regular S1/S2, no S3/S4, 2/6 HSM apex.  No peripheral edema.   Abdomen: Soft, nontender, no hepatosplenomegaly, no distention.  Skin: Intact without lesions or rashes.  Neurologic: Alert and oriented x 3.  Psych: Normal affect. Extremities: No clubbing or cyanosis.  HEENT: Normal.    Telemetry   NSR 90s (personally reviewed)   EKG    N/a   Labs    CBC Recent Labs    11/04/20 0254  WBC 7.9  HGB 13.5  HCT 41.7  MCV 86.7  PLT 272   Basic Metabolic Panel Recent Labs    11/05/20 0400 11/06/20 0149  NA 135 136  K 4.3 4.3  CL 96* 98  CO2 29 27  GLUCOSE 96 120*  BUN 28* 35*  CREATININE 1.60* 2.04*  CALCIUM 9.0 9.1  MG 2.5*  --    Liver Function Tests Recent Labs    11/04/20 0254  AST 24   ALT 28  ALKPHOS 109  BILITOT 1.5*  PROT 7.2  ALBUMIN 3.3*   No results for input(s): LIPASE, AMYLASE in the last 72 hours. Cardiac Enzymes No results for input(s): CKTOTAL, CKMB, CKMBINDEX, TROPONINI in the last 72 hours.  BNP: BNP (last 3 results) Recent Labs    09/21/20 0138 10/03/20 1222 10/31/20 1604  BNP 578.1* 2,190.9* 1,322.2*    ProBNP (last 3 results) Recent Labs    01/30/20 1645  PROBNP 1,759*     D-Dimer No results for input(s): DDIMER in the last 72 hours. Hemoglobin A1C No results for input(s): HGBA1C in the last 72 hours. Fasting Lipid Panel No results for input(s): CHOL, HDL, LDLCALC, TRIG, CHOLHDL, LDLDIRECT in the last 72 hours. Thyroid Function Tests No results for input(s): TSH, T4TOTAL, T3FREE, THYROIDAB in the last 72 hours.  Invalid input(s): FREET3  Other results:   Imaging    No results found.   Medications:     Scheduled Medications: . aspirin EC  81 mg Oral Daily  . atorvastatin  80 mg Oral Daily  . digoxin  0.125 mg Oral Daily  . empagliflozin  10 mg Oral Daily  . enoxaparin (LOVENOX) injection  40 mg Subcutaneous Q24H  . isosorbide-hydrALAZINE  1 tablet Oral TID  . ivabradine  5 mg Oral BID WC  . multivitamin with minerals  1 tablet Oral Daily  . pantoprazole (PROTONIX) IV  40 mg Intravenous Q12H  . potassium chloride SA  20 mEq Oral Daily  . sodium chloride flush  3 mL Intravenous Q12H  . spironolactone  25 mg Oral Daily  . torsemide  40 mg Oral QPM  . [START ON 11/07/2020] torsemide  80 mg Oral Q breakfast    Infusions: . sodium chloride    . 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 Cath in 10/19 showed 90% ostial OM1 and 70% PLV, no interventional target. 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 Unfortunately, not a candidate for advanced therapies or home milrinone with active use of cocaine and current social situation homeless(SW following).  Volume status improved with IV diuresis here, back on po torsemide. Creatinine higher today, got torsemide yesterday but was NPO for EGD.  SBP stable 90s-100s.  - Hold torsemide this morning, will start on torsemide 80 qam/40 qpm for home.  1st dose will be the evening dose today.  - Continue Bidil 1 tablet TID  - Continue digoxin 0.125, hold dose today with rise in creatinine and restart in am.  - Continue Jardiance 10 mg daily  - BP too soft for ARNi  - Continue spironolactone 25 mg daily.  -  no ? blocker given h/o low output but with mild sinus tachy have added ivabradine 5 mg bid.  -  Palliative care consult recommended, but pt adamantly declines.  2. Mitral Regurgitation: Moderate to severeon recent echo, suspectfunctional MR from dilated CMP.  3. CAD: H/o anterior STEMI with DES to LAD in 7/15.Cath in 10/19 with 90% ostial OM1 and 70% PLV, no interventional target.No recent CP/ACS.  -ContinueASA 81 and atorvastatin 80 mg daily - needs to refrain from using cocaine 4.Stage III CKD: Baseline SCr 1.5, higher at 2.0 today.  5. Abdominal Pain/ Abnormal Abdominal CT: Have suspected abdominal pain be be 2/2 low output HF, but CT shows a mass-like area in the antral pre-pyloric region of the stomach. EGD with complicated distal stomach ulcers. - PPI per GI.  6. Ongoing Cocaine abuse:UDS +  - He would like inpatient rehab for ongoing substance abuse, per [facility]. 7.RecentCOVID Infection: Tested + 09/19/20 8. Disposition: I think he is probably ready  for discharge from cardiac perspective.  Will need to hold morning torsemide today with rise in creatinine.  Will need close followup in CHF clinic and will arrange paramedicine.  Would certainly benefit from substance abuse rehab.  Cardiac meds for discharge: torsemide 80 qam/40 qpm, KCl 20 daily, ivabradine 5 mg bid, spironolactone 25 daily, digoxin 0.125 daily, Bidil 1 tab tid, Jardiance 10 mg daily, atorvastatin 80, ASA 81.  He will need meds prior to discharge.    Length of Stay: 5  Loralie Champagne, MD  11/06/2020, 7:37 AM  Advanced Heart Failure Team Pager [phone] (M-F; 7a - 4p)  Please contact [facility] for night-coverage after hours (4p -7a ) and weekends on  [email or web address]

## 2020-11-06 NOTE — Progress Notes (Signed)
Patient given discharge instructions and stated understanding. 

## 2020-11-06 NOTE — Progress Notes (Signed)
Outpatient CSW met with pt at bedside to discuss continued efforts to get pt into rehab.  While pt was outpatient we tried to get him into [facility] but they rejected him due to medical condition.  Pt continues to be on the waitlist for [facility] and states his sister continues to call each day to keep him on the list while hes in the hospital.  Pt had appt to go to [facility]t Monday but had to reschedule for next Monday for being in the hospital.  Pt is interested in trying facilities further out at this time- has phone assessment today with [facility] in [place].  Pt also agreeable to referral to [facility] completed referral and sent in for review- awaiting call back.  Outpatient CSW will continue to search other inpatient options in [place] and follow up with patient as needed  [name], [facility] Desk#: [phone] Cell#: [phone]

## 2020-11-06 NOTE — Progress Notes (Signed)
Outpatient CSW consulted by HF Team to enroll patient in paramedicine program.  CSW met with pt who is agreeable to trying paramedicine program again.  Pt has been referred to paramedicine multiple times in the past and usually has been discharged due to lack of communication with team.  Discussed concerns with patient and had him sign new paramedicine consent which includes patient expectations for participation in program.  Patient is willing to try and understands that communicating with paramedic is essential for continued enrollment.  Pt somewhat worried about where he will be staying long term as he feels like his welcome is running out with his sister whose address we currently have on file- pt acknowledged he will need to let us know if he moves to a different area.  Will continue to follow in outpatient setting  [name], [facility] Desk#: [phone] Cell#: [phone]

## 2020-11-06 NOTE — Discharge Summary (Addendum)
Physician Discharge Summary  Mical Kicklighter IWP:[id number] DOB: 1966-05-22 DOA: 10/31/2020  PCP: [name], NP  Admit date: 10/31/2020 Discharge date: 11/06/2020  Admitted From: Home Disposition: Home aloe aloe he had an MI E we keep getting called from your office please stop calling as this is a hospital phone number okay this is likely third fourth and taking Ambien last  Recommendations for Outpatient Follow-up:  1. Follow up with PCP in 1-2 weeks 2. Please obtain BMP/CBC in one week 3. Please follow up on the following pending results: EGD biopsy  Home Health:home Equipment/Devices: no  Discharge Condition: Stable Code Status:   Code Status: Full Code Diet recommendation:  Diet Order            Diet - low sodium heart healthy           DIET SOFT Room service appropriate? Yes; Fluid consistency: Thin  Diet effective now                  Brief/Interim Summary:  54 y.o.malewith medical history significant ofadvanced chronic systolic CHF with LVEF 20 to 25%, cocaine abuse, CKD stage II, presented with worsening of fluid retention. Patient been taking 80 mg torsemide twice daily for chronic systolic CHF. On and off, he noticed fluid retention around his ankles,and "feeling congested in my stomach,very easy to get food after eating any food",as educated by CHF team, has been taking extra dose of torsemide but he started to feel congestion and usually prevent fluid retention to become worse. However in the last 2 days, despite increasing his torsemide from 80 mg twice daily to 100 mg twice daily,I do not see increased urine output and meantime he started to feel increasing shortness of breath and again "bloated stomach".He claimed that he still use cocaine and the last time he used it was more than 2 weeks ago. And he also claims he has been compliant with fluid intake of less than 2000 mL/day and watch sodium intake. He denied any chest pain, no cough, no diarrhea,no  fever or chills.  ED Course:X-ray shows cardiomegaly but no significant pulmonary congestion,troponin 220>190. Patient is admitted, seen by cardiology given his fluid overload placed on IV Lasix drip. Patient was started on IV Lasix drip 10 mg/h had uptrending creatinine so Lasix decreased to 8 mg/h 11/6 Patient was monitored in the hospital, was diuresed well symptoms clinically improved.  Seen by CHF specialist. He was complaining of abdominal bloating she underwent CT abdomen that showed possible mass in the stomach underwent EGD after getting clearance from cardiology and shows complicated stomach ulcer.  Advised to avoid NSAIDs and keep on Protonix twice daily for 3 months and follow-up with GI. Patient alert and diet no shortness breath nausea vomiting chest pain.  Is being discharged in close instruction for follow-up to check his kidney function in 6 days from now. Also seen by social worker to provide with resources for outpatient drug rehabilitation, TOC consulted to arrange all his cardiac medications for discharge.  Discharge Diagnoses:   Acute on chronic systolic CHF/mitral regurgitation moderate to severe in recent echo-likely from dilated CM: CHF exacerbation in the setting of noncompliance, substance abuse.  Seen by cardiology-not a candidate for advanced therapy.  Palliative care approach has been recommended by cardiology but patient has adamantly refused.  Diuresed well on Lasix drip and switched to torsemide 100 mg, continue the same continue BiDil , digoxin, Aldactone, Jardiance 10 mg, ivabradine 5 mg twice daily.  Creatinine slightly  up but he has appointment set for 11/16 to repeat his BMP and adjust his medication as needed.  Patient has been given full CHF home instruction and he is quite aware of his illness and understands medical attention  Complicated stomach ulcer: Presented with abdominal bloating and had abnormal CT abdomen pelvis, masslike area in the antral  prepyloric region, upper GI shows potential mass or bezoar in the region of the antrum of the stomach, seen by GI, underwent EGD found to have complicated stomach - patient will continue Protonix twice daily for 3 months.Instructed patient to follow-up with GI for biopsy results- I had also informed his sister yesterday about the need to follow biopsy result to makes sure it is not cancer/tumor. Phone number has been provided.    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  Cocaine abuse patient denied using cocaine recently but was more than a month ago.  Urine drug screen noted.Counseling.  AKI on CKD stage IIIa: Creatinine has been fluctuating it has been as high as mid 2s recently, noted up into this morning discussed with Dr. Aundra Dubin and feels okay with patient discharging follow-up closely with his renal function.  Patient is agreeable with the plan.   Recent Labs  Lab 11/02/20 0054 11/03/20 0403 11/04/20 0254 11/05/20 0400 11/06/20 0149  BUN 36* 38* 38* 28* 35*  CREATININE 1.92* 1.74* 1.56* 1.60* 2.04*   Recent Covid infection 09/19/2020.  Addendum: 11/06/20: 2:58 pm While completing hss chart- noticed that Biopsy report came back, was called by pathology dept ( but pt was already discharged and result not shared with me- and they will f/u with GI),d- I INFORMED Dr Sherron Flemings the result and Dr Michail Sermon acknowledged it- he will follow up with the patient- further plan as per GI since he is already discharged. I called his sister and informed her that he needs to f/u with GI for biopsy result soon-I tried to call him but the phone could not receive voice message- it was not set up.  Addendum Ms Jacqualine Mau- his sister and HCPOA- called me back- I updated abt the diagnosis seen in EGD  Biopsy and provided her with Dr Michail Sermon office no and address and to see him sooner than later to figure out further plan.  Consults: Cardiology, gastroenterology  Subjective: Alert and oriented no chest pain shortness of breath.  Feels well.  Would like to go home today.  Discharge Exam: Vitals:   11/06/20 0606 11/06/20 0902  BP: 99/81 (!) 131/97  Pulse: 93 100  Resp: 17 18  Temp: 98.4 F (36.9 C) (!) 97.5 F (36.4 C)  SpO2: 95% 98%   General: Pt is alert, awake, not in acute distress Cardiovascular: RRR, S1/S2 +, no rubs, no gallops Respiratory: CTA bilaterally, no wheezing, no rhonchi Abdominal: Soft, NT, ND, bowel sounds + Extremities: no edema, no cyanosis  Discharge Instructions  Discharge Instructions    Diet - low sodium heart healthy   Complete by: As directed    Discharge instructions   Complete by: As directed    Please call call MD or return to ER for similar or worsening recurring problem that brought you to hospital or if any fever,nausea/vomiting,abdominal pain, uncontrolled pain, chest pain,  shortness of breath or any other alarming symptoms.  Please follow-up your doctor as instructed in  a week time and call the office for appointment.  You have a follow-up scheduled with heart failure clinic for repeat BMP to monitor your kidney functions.  You will need to follow-up with Dr. Michail Sermon from GI for biopsy results in 4-6 wks and repeat endoscopy in 2 to 3 months and continue to keep Protonix twice a day for 3 months.  Please avoid alcohol, smoking, or any other illicit substance and maintain healthy habits including taking your regular medications as prescribed.  You were cared for by a hospitalist during your hospital stay. If you have any questions about your discharge medications or the care you received while you were in the hospital after you are discharged, you can call the unit and ask to speak with the hospitalist on call if the hospitalist that took  care of you is not available.  Once you are discharged, your primary care physician will handle any further medical issues. Please note that NO REFILLS for any discharge medications will be authorized once you are discharged, as it is imperative that you return to your primary care physician (or establish a relationship with a primary care physician if you do not have one) for your aftercare needs so that they can reassess your need for medications and monitor your lab values    Please monitor weight daily, avoid strain and stop activity anytime you have chest pain or worsening shortness of breath. Anytime you have any of the following symptoms: 1) 3 pound weight gain in 24 hours or 5 pounds in 1 week 2) shortness of breath, with or without a dry hacking cough 3) swelling in the hands, feet or stomach 4) if you have to sleep on extra pillows at night in order to breathe.   Increase activity slowly   Complete by: As directed      Allergies as of 11/06/2020   No Known Allergies     Medication List    STOP taking these medications   doxycycline 100 MG tablet Commonly known as: VIBRA-TABS   omeprazole 20 MG capsule Commonly known as: PRILOSEC     TAKE these medications   albuterol 108 (90 Base) MCG/ACT inhaler Commonly known as: VENTOLIN HFA Inhale 2 puffs into the lungs every 4 (four) hours as needed for wheezing or shortness of breath.   aspirin 81 MG EC tablet Take 1 tablet (81 mg total) by mouth daily.   atorvastatin 80 MG tablet Commonly known as: LIPITOR Take 1 tablet (80 mg total) by mouth daily.   digoxin 0.125 MG tablet Commonly known as: LANOXIN Take 1 tablet (0.125 mg total) by mouth daily.   empagliflozin 10 MG Tabs tablet Commonly known as: Jardiance Take 1 tablet (10 mg total) by mouth daily.   isosorbide-hydrALAZINE 20-37.5 MG tablet Commonly known as: BIDIL Take 1 tablet by mouth 3 (three) times daily.   ivabradine 5 MG Tabs tablet Commonly known as:  CORLANOR Take 1 tablet (5 mg total) by mouth 2 (two) times daily with a meal.   multivitamin tablet Take 1 tablet by mouth daily.   pantoprazole 40 MG tablet Commonly known as: PROTONIX Take 1 tablet (40 mg total) by mouth 2 (two) times daily. What changed: when to take this   potassium chloride SA 20 MEQ tablet Commonly known as: KLOR-CON Take 1 tablet (20 mEq total) by mouth daily.   spironolactone 25 MG tablet Commonly known as: ALDACTONE Take 1 tablet (25 mg total) by mouth daily. Start taking on: November 07, 2020  torsemide 20 MG tablet Commonly known as: DEMADEX 80 MG  Po daily with breakfast and 40 mg in evening. What changed:   how much to take  how to take this  when to take this  additional instructions       Follow-up Information    [place] Follow up on 11/12/2020.   Specialty: Cardiology Why: at 10:30  Contact information: [address] [phone] [place] [address] Lydia, NP Follow up in 1 week(s).   Specialty: Internal Medicine Contact information: [place] [address] [phone]        Wilford Corner, MD Follow up in 6 week(s).   Specialty: Gastroenterology Why: for biopsy result and repeat EGD in 2-3 months Contact information: [address] [place] [address] [phone]              No Known Allergies  The results of significant diagnostics from this hospitalization (including imaging, microbiology, ancillary and laboratory) are listed below for reference.    Microbiology: Recent Results (from the past 240 hour(s))  Respiratory Panel by RT PCR (Flu A&B, Covid) - Nasopharyngeal Swab     Status: None   Collection Time: 10/28/20  4:30 AM   Specimen: Nasopharyngeal Swab  Result Value Ref Range Status   SARS Coronavirus 2 by RT PCR NEGATIVE NEGATIVE Final    Comment:  (NOTE) SARS-CoV-2 target nucleic acids are NOT DETECTED.  The SARS-CoV-2 RNA is generally detectable in upper respiratoy specimens during the acute phase of infection. The lowest concentration of SARS-CoV-2 viral copies this assay can detect is 131 copies/mL. A negative result does not preclude SARS-Cov-2 infection and should not be used as the sole basis for treatment or other patient management decisions. A negative result may occur with  improper specimen collection/handling, submission of specimen other than nasopharyngeal swab, presence of viral mutation(s) within the areas targeted by this assay, and inadequate number of viral copies (<131 copies/mL). A negative result must be combined with clinical observations, patient history, and epidemiological information. The expected result is Negative.  Fact Sheet for Patients:  PinkCheek.be  Fact Sheet for Healthcare Providers:  GravelBags.it  This test is no t yet approved or cleared by the [place] FDA and  has been authorized for detection and/or diagnosis of SARS-CoV-2 by FDA under an Emergency Use Authorization (EUA). This EUA will remain  in effect (meaning this test can be used) for the duration of the COVID-19 declaration under Section 564(b)(1) of the Act, 21 U.S.C. section 360bbb-3(b)(1), unless the authorization is terminated or revoked sooner.     Influenza A by PCR NEGATIVE NEGATIVE Final   Influenza B by PCR NEGATIVE NEGATIVE Final    Comment: (NOTE) The Xpert Xpress SARS-CoV-2/FLU/RSV assay is intended as an aid in  the diagnosis of influenza from Nasopharyngeal swab specimens and  should not be used as a sole basis for treatment. Nasal washings and  aspirates are unacceptable for Xpert Xpress SARS-CoV-2/FLU/RSV  testing.  Fact Sheet for Patients: PinkCheek.be  Fact Sheet for Healthcare  Providers: GravelBags.it  This test is not yet approved or cleared by the [place] FDA and  has been authorized for detection and/or diagnosis of SARS-CoV-2 by  FDA under an Emergency Use Authorization (EUA). This EUA will remain  in effect (meaning this test can be used) for the duration of the  Covid-19 declaration under Section  564(b)(1) of the Act, 21  U.S.C. section 360bbb-3(b)(1), unless the authorization is  terminated or revoked. Performed at [facility], [place] [address], [place], [place] [address]     Procedures/Studies: CT ABDOMEN PELVIS WO CONTRAST  Result Date: 11/01/2020 CLINICAL DATA:  54 year old male with history of epigastric abdominal pain for the past few days. EXAM: CT ABDOMEN AND PELVIS WITHOUT CONTRAST TECHNIQUE: Multidetector CT imaging of the abdomen and pelvis was performed following the standard protocol without IV contrast. COMPARISON:  CT the chest, abdomen and pelvis 04/09/2020. FINDINGS: Lower chest: Cardiomegaly. Atherosclerotic calcifications in the left main, left anterior descending, left circumflex and right coronary arteries. Hepatobiliary: Calcified granuloma in the right lobe of the liver. No other definite suspicious cystic or solid hepatic lesions are confidently identified on today's noncontrast CT examination. Unenhanced appearance of the gallbladder is normal. Pancreas: No definite pancreatic mass or peripancreatic fluid collections or inflammatory changes are noted on today's noncontrast CT examination. Spleen: Unremarkable. Adrenals/Urinary Tract: There are no abnormal calcifications within the collecting system of either kidney, along the course of either ureter, or within the lumen of the urinary bladder. No hydroureteronephrosis or perinephric stranding to suggest urinary tract obstruction at this time. The unenhanced appearance of the kidneys is unremarkable bilaterally. Unenhanced appearance of the  urinary bladder is normal. Bilateral adrenal glands are normal in appearance. Stomach/Bowel: In the antral pre-pyloric region of the stomach there is a masslike area estimated to measure approximately 6.3 x 5.6 x 5.6 cm (axial image 31 of series 3 and sagittal image 98 of series 7). No pathologic dilatation of small bowel or colon. Normal appendix. Vascular/Lymphatic: Aortic atherosclerosis. No lymphadenopathy noted in the abdomen or pelvis. Reproductive: Prostate gland and seminal vesicles are unremarkable in appearance. Other: No significant volume of ascites.  No pneumoperitoneum. Musculoskeletal: There are no aggressive appearing lytic or blastic lesions noted in the visualized portions of the skeleton. IMPRESSION: 1. No definite acute findings are noted in the abdomen or pelvis to account for the patient's symptoms. 2. However, there is a mass-like area in the antral pre-pyloric region of the stomach. This may simply represent ingested food, however, the possibility of a primary gastric neoplasm warrants consideration. Further evaluation with upper GI or endoscopy should be considered in the near future to exclude the possibility of malignancy. 3. Aortic atherosclerosis, in addition to left main and 3 vessel coronary artery disease. Please note that although the presence of coronary artery calcium documents the presence of coronary artery disease, the severity of this disease and any potential stenosis cannot be assessed on this non-gated CT examination. Assessment for potential risk factor modification, dietary therapy or pharmacologic therapy may be warranted, if clinically indicated. 4. Cardiomegaly. Electronically Signed   By: Vinnie Langton M.D.   On: 11/01/2020 12:47   DG Chest 2 View  Result Date: 10/31/2020 CLINICAL DATA:  Chest pain, shortness of breath EXAM: CHEST - 2 VIEW COMPARISON:  Chest radiograph dated 10/08/2020. FINDINGS: The heart is enlarged. The lungs are clear and there is no pleural  effusion or pneumothorax. The osseous structures are intact. IMPRESSION: Cardiomegaly without acute pulmonary disease. Electronically Signed   By: Zerita Boers M.D.   On: 10/31/2020 14:57   DG CHEST PORT 1 VIEW  Result Date: 10/08/2020 CLINICAL DATA:  PICC placement EXAM: PORTABLE CHEST 1 VIEW COMPARISON:  10/03/2020 FINDINGS: Right PICC line tip overlies the SVC. Pulmonary vascular congestion. No pleural effusion. Cardiomegaly. No pneumothorax. IMPRESSION: PICC line tip overlies SVC. Pulmonary  vascular congestion and cardiomegaly. Electronically Signed   By: Macy Mis M.D.   On: 10/08/2020 11:04   DG UGI W DOUBLE CM (HD BA)  Result Date: 11/02/2020 CLINICAL DATA:  54 year old male with epigastric abdominal pain. Possible gastric mass noted on CT the abdomen and pelvis. EXAM: UPPER GI SERIES WITH KUB TECHNIQUE: After obtaining a scout radiograph a routine upper GI series was performed using thin and high density barium. FLUOROSCOPY TIME:  Fluoroscopy Time:  3 minutes and 6 seconds Radiation Exposure Index (if provided by the fluoroscopic device): 42.3 mGy COMPARISON:  No prior.  CT the abdomen and pelvis 11/01/2020. FINDINGS: Preprocedural KUB demonstrates a nonobstructive bowel gas pattern, and is otherwise unremarkable. Double contrast images of the esophagus demonstrate a normal appearance of the esophageal mucosa. Double contrast images of the stomach were severely limited by under distension of the stomach and what appears to be retained gastric contents. With this limitation in mind, there is a persistent mass-like accumulation of barium in the antrum of the stomach which is suspicious for barium outlining either a mass, or collection of material such as a bezoar. In addition, diffuse gastric fold thickening was noted. Multiple single swallow attempts were observed which demonstrated some mild proximal escape, but otherwise normal esophageal motility. Full column esophagram demonstrated normal  esophageal anatomy. Specifically, no hiatal hernia, and no evidence of esophageal mass, stricture or esophageal ring. A barium tablet was administered, which passed readily into the stomach. IMPRESSION: 1. Limited examination with evidence of potential mass or bezoar in the region of the antrum of the stomach. Further evaluation with endoscopy is suggested to better evaluate this finding. 2. Diffuse gastric fold thickening concerning for gastritis. 3. Normal appearance of the esophagus. Electronically Signed   By: Vinnie Langton M.D.   On: 11/02/2020 14:12   VAS [place] UPPER EXTREMITY VENOUS DUPLEX  Result Date: 10/09/2020 UPPER VENOUS STUDY  Indications: Edema Risk Factors: Surgery Heart Cath 10/03/2020. Performing Technologist: Griffin Basil RCT RDMS  Examination Guidelines: A complete evaluation includes B-mode imaging, spectral Doppler, color Doppler, and power Doppler as needed of all accessible portions of each vessel. Bilateral testing is considered an integral part of a complete examination. Limited examinations for reoccurring indications may be performed as noted.  Right Findings: +----------+------------+---------+-----------+----------------+-------+ RIGHT     CompressiblePhasicitySpontaneous   Properties   Summary +----------+------------+---------+-----------+----------------+-------+ IJV           Full       Yes       Yes                            +----------+------------+---------+-----------+----------------+-------+ Subclavian    Full       Yes       Yes                            +----------+------------+---------+-----------+----------------+-------+ Axillary      Full       Yes       Yes                            +----------+------------+---------+-----------+----------------+-------+ Brachial    Partial      Yes       Yes    softly echogenic Acute  +----------+------------+---------+-----------+----------------+-------+ Radial        Full                                                 +----------+------------+---------+-----------+----------------+-------+  Ulnar         Full                                                +----------+------------+---------+-----------+----------------+-------+ Cephalic      Full                                                +----------+------------+---------+-----------+----------------+-------+ Basilic       Full                                                +----------+------------+---------+-----------+----------------+-------+  Left Findings: +----+------------+---------+-----------+----------+-------+ LEFTCompressiblePhasicitySpontaneousPropertiesSummary +----+------------+---------+-----------+----------+-------+ IJV     Full       Yes       Yes                      +----+------------+---------+-----------+----------+-------+  Summary:  Right: No evidence of superficial vein thrombosis in the upper extremity. Findings consistent with acute deep vein thrombosis involving the right brachial veins. Small right brachiaal vein thrombus - non occlusive.  *See table(s) above for measurements and observations.  Diagnosing physician: Curt Jews MD Electronically signed by Curt Jews MD on 10/09/2020 at 3:09:18 PM.    Final     Labs: BNP (last 3 results) Recent Labs    09/21/20 0138 10/03/20 1222 10/31/20 1604  BNP 578.1* 2,190.9* 0,539.7*   Basic Metabolic Panel: Recent Labs  Lab 11/02/20 0054 11/03/20 0403 11/04/20 0254 11/05/20 0400 11/06/20 0149  NA 138 138 134* 135 136  K 3.8 4.0 4.0 4.3 4.3  CL 99 99 95* 96* 98  CO2 27 27 27 29 27   GLUCOSE 121* 127* 123* 96 120*  BUN 36* 38* 38* 28* 35*  CREATININE 1.92* 1.74* 1.56* 1.60* 2.04*  CALCIUM 9.0 9.6 9.2 9.0 9.1  MG  --   --   --  2.5*  --    Liver Function Tests: Recent Labs  Lab 10/31/20 1604 11/02/20 0054 11/03/20 0403 11/04/20 0254  AST 31 29 27 24   ALT 32 30 31 28   ALKPHOS 113 101 111 109  BILITOT  1.5* 1.2 1.1 1.5*  PROT 7.2 6.3* 7.3 7.2  ALBUMIN 3.3* 2.9* 3.3* 3.3*   Recent Labs  Lab 10/31/20 1604  LIPASE 43   No results for input(s): AMMONIA in the last 168 hours. CBC: Recent Labs  Lab 10/31/20 1451 11/02/20 0054 11/03/20 0403 11/04/20 0254  WBC 7.7 8.7 7.8 7.9  HGB 14.3 11.7* 13.4 13.5  HCT 45.6 36.6* 40.6 41.7  MCV 91.6 86.9 87.7 86.7  PLT 235 214 262 253   Cardiac Enzymes: No results for input(s): CKTOTAL, CKMB, CKMBINDEX, TROPONINI in the last 168 hours. BNP: Invalid input(s): POCBNP CBG: No results for input(s): GLUCAP in the last 168 hours. D-Dimer No results for input(s): DDIMER in the last 72 hours. Hgb A1c No results for input(s): HGBA1C in the last 72 hours. Lipid Profile No results for input(s): CHOL, HDL, LDLCALC, TRIG, CHOLHDL, LDLDIRECT in the last 72 hours. Thyroid function studies No results for input(s): TSH, T4TOTAL, T3FREE, THYROIDAB in the  last 72 hours.  Invalid input(s): FREET3 Anemia work up No results for input(s): VITAMINB12, FOLATE, FERRITIN, TIBC, IRON, RETICCTPCT in the last 72 hours. Urinalysis    Component Value Date/Time   COLORURINE STRAW (A) 10/27/2020 2221   APPEARANCEUR CLEAR 10/27/2020 2221   LABSPEC 1.005 10/27/2020 2221   PHURINE 7.0 10/27/2020 2221   GLUCOSEU >=500 (A) 10/27/2020 2221   HGBUR NEGATIVE 10/27/2020 2221   [place] 10/27/2020 2221   KETONESUR NEGATIVE 10/27/2020 2221   PROTEINUR NEGATIVE 10/27/2020 2221   NITRITE NEGATIVE 10/27/2020 2221   LEUKOCYTESUR NEGATIVE 10/27/2020 2221   Sepsis Labs Invalid input(s): PROCALCITONIN,  WBC,  LACTICIDVEN Microbiology Recent Results (from the past 240 hour(s))  Respiratory Panel by RT PCR (Flu A&B, Covid) - Nasopharyngeal Swab     Status: None   Collection Time: 10/28/20  4:30 AM   Specimen: Nasopharyngeal Swab  Result Value Ref Range Status   SARS Coronavirus 2 by RT PCR NEGATIVE NEGATIVE Final    Comment: (NOTE) SARS-CoV-2 target nucleic  acids are NOT DETECTED.  The SARS-CoV-2 RNA is generally detectable in upper respiratoy specimens during the acute phase of infection. The lowest concentration of SARS-CoV-2 viral copies this assay can detect is 131 copies/mL. A negative result does not preclude SARS-Cov-2 infection and should not be used as the sole basis for treatment or other patient management decisions. A negative result may occur with  improper specimen collection/handling, submission of specimen other than nasopharyngeal swab, presence of viral mutation(s) within the areas targeted by this assay, and inadequate number of viral copies (<131 copies/mL). A negative result must be combined with clinical observations, patient history, and epidemiological information. The expected result is Negative.  Fact Sheet for Patients:  PinkCheek.be  Fact Sheet for Healthcare Providers:  GravelBags.it  This test is no t yet approved or cleared by the [place] FDA and  has been authorized for detection and/or diagnosis of SARS-CoV-2 by FDA under an Emergency Use Authorization (EUA). This EUA will remain  in effect (meaning this test can be used) for the duration of the COVID-19 declaration under Section 564(b)(1) of the Act, 21 U.S.C. section 360bbb-3(b)(1), unless the authorization is terminated or revoked sooner.     Influenza A by PCR NEGATIVE NEGATIVE Final   Influenza B by PCR NEGATIVE NEGATIVE Final    Comment: (NOTE) The Xpert Xpress SARS-CoV-2/FLU/RSV assay is intended as an aid in  the diagnosis of influenza from Nasopharyngeal swab specimens and  should not be used as a sole basis for treatment. Nasal washings and  aspirates are unacceptable for Xpert Xpress SARS-CoV-2/FLU/RSV  testing.  Fact Sheet for Patients: PinkCheek.be  Fact Sheet for Healthcare Providers: GravelBags.it  This test  is not yet approved or cleared by the [place] FDA and  has been authorized for detection and/or diagnosis of SARS-CoV-2 by  FDA under an Emergency Use Authorization (EUA). This EUA will remain  in effect (meaning this test can be used) for the duration of the  Covid-19 declaration under Section 564(b)(1) of the Act, 21  U.S.C. section 360bbb-3(b)(1), unless the authorization is  terminated or revoked. Performed at [facility], [place] [address] [name], [place] [address]      Time coordinating discharge: 35 minutes  SIGNED: Antonieta Pert, MD  [facility] 11/06/2020, 11:53 AM  If 7PM-7AM, please contact night-coverage [email or web address]

## 2020-11-06 NOTE — Anesthesia Postprocedure Evaluation (Signed)
Anesthesia Post Note  Patient: Tyler Wong  Procedure(s) Performed: ESOPHAGOGASTRODUODENOSCOPY (EGD) (N/A ) BIOPSY     Patient location during evaluation: Endoscopy Anesthesia Type: MAC Level of consciousness: awake and alert Pain management: pain level controlled Vital Signs Assessment: post-procedure vital signs reviewed and stable Respiratory status: spontaneous breathing, nonlabored ventilation, respiratory function stable and patient connected to nasal cannula oxygen Cardiovascular status: blood pressure returned to baseline and stable Postop Assessment: no apparent nausea or vomiting Anesthetic complications: no   No complications documented.  Last Vitals:  Vitals:   11/06/20 0606 11/06/20 0902  BP: 99/81 (!) 131/97  Pulse: 93 100  Resp: 17 18  Temp: 36.9 C (!) 36.4 C  SpO2: 95% 98%    Last Pain:  Vitals:   11/06/20 0902  TempSrc: Oral  PainSc: 0-No pain                 Maleiah Dula S

## 2020-11-07 ENCOUNTER — Telehealth: Payer: Self-pay

## 2020-11-07 NOTE — Telephone Encounter (Signed)
Transition Care Management Follow-up Telephone Call  Date of discharge and from where: 11/06/2020, [facility]   How have you been since you were released from the hospital? He said he is doing " okay."  Any questions or concerns? Yes - he is concerned about having the correct medication for his stomach.   Items Reviewed:  Did the pt receive and understand the discharge instructions provided? Yes   Medications obtained and verified? Yes  he has all medications  The list was reviewed with him. He understands that the pantoprazole is for his stomach.  He did not have any other questions about the medication regime   Other? No   Any new allergies since your discharge? No   Do you have support at home? Yes   Home Care and Equipment/Supplies: Were home health services ordered? No If so, what is the name of the agency? n/a  Has the agency set up a time to come to the patient's home? n/a Were any new equipment or medical supplies ordered?  no What is the name of the medical supply agency? n/a Were you able to get the supplies/equipment? n/a Do you have any questions related to the use of the equipment or supplies? No n/a He has been referred back to the community paramedicine program   Functional Questionnaire: (I = Independent and D = Dependent) ADLs: independent  Follow up appointments reviewed:   [facility] Hospital f/u appt confirmed? yes, Juluis Mire, NP 11/14/2020.  [facility] f/u appt confirmed? cardiology - 11/12/2020. he does not have an appointment scheduled with GI yet.  He has the phone number on his AVS. instructed him to call as soon as possible to schedule an appointment    Are transportation arrangements needed? No also informed him that his insurance may pay for rides to medical appointments so he should call the company to check   If their condition worsens, is the pt aware to call PCP or go to the Emergency Dept.? yes  Was the patient provided  with contact information for the PCP's office or ED? he has the phone number for [facility]  Was to pt encouraged to call back with questions or concerns?   yes

## 2020-11-08 ENCOUNTER — Telehealth (HOSPITAL_COMMUNITY): Payer: Self-pay | Admitting: Licensed Clinical Social Worker

## 2020-11-08 ENCOUNTER — Other Ambulatory Visit: Payer: Self-pay | Admitting: *Deleted

## 2020-11-08 NOTE — Patient Outreach (Signed)
Care Coordination  11/08/2020  Aniello Christopoulos 15-Sep-1966 [id number]   Third unsuccessful telephone outreach was attempted today. The patient was referred to the case management team for assistance with care management and care coordination. The patient's primary care provider has been notified of our unsuccessful attempts to make or maintain contact with the patient. The care management team is pleased to engage with this patient at any time in the future should he/she be interested in assistance from the care management team.   Follow Up Plan: The Managed Medicaid care management team is available to follow up with the patient after provider conversation with the patient regarding recommendation for care management engagement and subsequent re-referral to the care management team.   Lurena Joiner RN, BSN [place] RN Care Coordinator

## 2020-11-08 NOTE — Telephone Encounter (Signed)
Pt called CSW to see about getting letter verifying he was in the ED 10/31-11/1 because he missed a court date on 11/1 because of him being in the hospital.  [place] wrote letter and pt picking up from clinic today.  Will continue to follow and assist as needed  [name], [facility] Desk#: [phone] Cell#: [phone]

## 2020-11-08 NOTE — Patient Instructions (Signed)
Visit Information  [name]  - as a part of your Medicaid benefit, you are eligible for care management and care coordination services at no cost or copay. I was unable to reach you by phone today but would be happy to help you with your health related needs. Please feel free to call me @ [phone].     Lurena Joiner RN, BSN [place]  [employer] manager

## 2020-11-11 ENCOUNTER — Telehealth: Payer: Self-pay

## 2020-11-11 NOTE — Progress Notes (Signed)
Spoke with patient regarding referral we had received from Dr. Michail Sermon for stomach cancer.  I have offered him an appointment on Wednesday 11/17 to arrive by 10 with Ned Card NP and Dr. Benay Spice.  He is aware of our location and has agreed to this appointment. He knows he may bring one person with him to this appointment.

## 2020-11-11 NOTE — Telephone Encounter (Signed)
Spoke with patient and have scheduled an In-person Consult for 11/26/20 @ 8:30 AM.  COVID screening was negative.  Consent obtained; updated Outlook/Netsmart/Team List and Epic.

## 2020-11-12 ENCOUNTER — Other Ambulatory Visit: Payer: Self-pay

## 2020-11-12 ENCOUNTER — Other Ambulatory Visit (HOSPITAL_COMMUNITY): Payer: Self-pay

## 2020-11-12 ENCOUNTER — Ambulatory Visit (HOSPITAL_COMMUNITY)
Admission: RE | Admit: 2020-11-12 | Discharge: 2020-11-12 | Disposition: A | Discharge status: Home / Self Care | Payer: Medicaid Other | Source: Ambulatory Visit | Attending: Adult Health | Admitting: Adult Health

## 2020-11-12 VITALS — BP 112/75 | HR 94 | Wt 165.4 lb

## 2020-11-12 DIAGNOSIS — J449 Chronic obstructive pulmonary disease, unspecified: Secondary | ICD-10-CM | POA: Insufficient documentation

## 2020-11-12 DIAGNOSIS — C169 Malignant neoplasm of stomach, unspecified: Secondary | ICD-10-CM | POA: Insufficient documentation

## 2020-11-12 DIAGNOSIS — Z955 Presence of coronary angioplasty implant and graft: Secondary | ICD-10-CM | POA: Insufficient documentation

## 2020-11-12 DIAGNOSIS — Z720 Tobacco use: Secondary | ICD-10-CM | POA: Diagnosis not present

## 2020-11-12 DIAGNOSIS — Z79899 Other long term (current) drug therapy: Secondary | ICD-10-CM | POA: Diagnosis not present

## 2020-11-12 DIAGNOSIS — I2510 Atherosclerotic heart disease of native coronary artery without angina pectoris: Secondary | ICD-10-CM

## 2020-11-12 DIAGNOSIS — I5022 Chronic systolic (congestive) heart failure: Secondary | ICD-10-CM | POA: Insufficient documentation

## 2020-11-12 DIAGNOSIS — I130 Hypertensive heart and chronic kidney disease with heart failure and stage 1 through stage 4 chronic kidney disease, or unspecified chronic kidney disease: Secondary | ICD-10-CM | POA: Diagnosis not present

## 2020-11-12 DIAGNOSIS — Z8616 Personal history of COVID-19: Secondary | ICD-10-CM | POA: Insufficient documentation

## 2020-11-12 DIAGNOSIS — I252 Old myocardial infarction: Secondary | ICD-10-CM | POA: Insufficient documentation

## 2020-11-12 DIAGNOSIS — Z9861 Coronary angioplasty status: Secondary | ICD-10-CM

## 2020-11-12 DIAGNOSIS — K219 Gastro-esophageal reflux disease without esophagitis: Secondary | ICD-10-CM | POA: Diagnosis not present

## 2020-11-12 DIAGNOSIS — I5042 Chronic combined systolic (congestive) and diastolic (congestive) heart failure: Secondary | ICD-10-CM | POA: Diagnosis not present

## 2020-11-12 DIAGNOSIS — N183 Chronic kidney disease, stage 3 unspecified: Secondary | ICD-10-CM | POA: Insufficient documentation

## 2020-11-12 DIAGNOSIS — F17210 Nicotine dependence, cigarettes, uncomplicated: Secondary | ICD-10-CM | POA: Diagnosis not present

## 2020-11-12 DIAGNOSIS — F1410 Cocaine abuse, uncomplicated: Secondary | ICD-10-CM | POA: Diagnosis not present

## 2020-11-12 DIAGNOSIS — I340 Nonrheumatic mitral (valve) insufficiency: Secondary | ICD-10-CM | POA: Diagnosis not present

## 2020-11-12 DIAGNOSIS — I255 Ischemic cardiomyopathy: Secondary | ICD-10-CM

## 2020-11-12 DIAGNOSIS — Z7982 Long term (current) use of aspirin: Secondary | ICD-10-CM | POA: Diagnosis not present

## 2020-11-12 LAB — BASIC METABOLIC PANEL
Anion gap: 11 (ref 5–15)
BUN: 41 mg/dL — ABNORMAL HIGH (ref 6–20)
CO2: 27 mmol/L (ref 22–32)
Calcium: 9.3 mg/dL (ref 8.9–10.3)
Chloride: 100 mmol/L (ref 98–111)
Creatinine, Ser: 1.82 mg/dL — ABNORMAL HIGH (ref 0.61–1.24)
GFR, Estimated: 43 mL/min — ABNORMAL LOW (ref >60)
Glucose, Bld: 110 mg/dL — ABNORMAL HIGH (ref 70–99)
Potassium: 4.3 mmol/L (ref 3.5–5.1)
Sodium: 138 mmol/L (ref 135–145)

## 2020-11-12 LAB — BRAIN NATRIURETIC PEPTIDE: B Natriuretic Peptide: 970.2 pg/mL — ABNORMAL HIGH (ref 0.0–100.0)

## 2020-11-12 NOTE — Patient Instructions (Signed)
It was great to see you today! No medication changes are needed at this time.  Labs today We will only contact you if something comes back abnormal or we need to make some changes. Otherwise no news is good news!  Your physician recommends that you schedule a follow-up appointment in: 2-3 weeks  If you have any questions or concerns before your next appointment please send [place] a message through [place] or call our office at [phone].    TO LEAVE A MESSAGE FOR THE NURSE SELECT OPTION 2, PLEASE LEAVE A MESSAGE INCLUDING: . YOUR NAME . DATE OF BIRTH . CALL BACK NUMBER . REASON FOR CALL**this is important as we prioritize the call backs  YOU WILL RECEIVE A CALL BACK THE SAME DAY AS LONG AS YOU CALL BEFORE 4:00 PM

## 2020-11-12 NOTE — Progress Notes (Signed)
Paramedicine Encounter   Patient ID: Tyler Wong , male,   DOB: Sep 20, 1966,54 y.o.,  MRN: [id number]   Mr Bologna was seen in the HF clinic today with Darrick Grinder, NP-C. He reported feeling generally well, denying chest pain, headache, dizziness, orthopnea, fever or cough since being discharged from the hospital. He stated he has some SOB when climbing stairs but it resolves quickly. He had all his medications with him in clinic but was nearly out of bidil, pantoprazole and jardiance. I contacted his pharmacy and confirmed that they would fill these prescriptions next week. He was provided with a pillbox which was filled for him this visit. I will follow up next week on Tuesday.   Jacquiline Doe, EMT 11/12/2020

## 2020-11-12 NOTE — Progress Notes (Signed)
Advanced Heart Failure Clinic Note   Referring Physician: PCP: [name], NP PCP-Cardiologist: Shelva Majestic, MD  [facility]: Dr. 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 Also w/ h/o poor compliance. Previous referred to paramedicine but not fully cooperative. Recently diagnosed w/ COVID 09/19/20.   Had recent admit earlier this month for a/c systolic heart failure w/ low out-put and AKI requiring milrinone to help w/ diuresis. He was cocaine positive. Diuresed and weaned off milrinone w/ stable co-ox at 65%. Scr had improved from 2.1>>1.7. D/c wt 159 lb. Unfortunately, his wife was also hospitalized for COVID and died while he was admitted.   Readmitted 10/31/20 with marked volume overload and low output heart failure. EGD on 11/9 Have suspected abdominal pain be be 2/2 low output HF, but CT showsa mass-like area in the antral pre-pyloric region of the stomach. 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 Discharged on 11/06/20.   Today he returns for HF follow up.Overall feeling fine. SOB with steps. Denies PND/Orthopnea. Appetite ok. No fever or chills. Weight at home 165  pounds. Taking all medications. Smoking 3 cigarettes per day. Currently living with his sister. He has issues wit transportation. Followed by HF Paramedicine. Now [facility].   Review of systems complete and found to be negative unless listed in HPI.      Past Medical History:  Diagnosis Date  . CAD in native artery 07/17/14   STEMI- LAD stenosis with DES  . CHF  (congestive heart failure) ([name])   . Cocaine abuse ([place])   . COPD (chronic obstructive pulmonary disease) ([place])   . Dyspnea   . GERD (gastroesophageal reflux disease)   . Hypertension   . ST elevation myocardial infarction (STEMI) involving left anterior descending (LAD) coronary artery with complication ([place])   . Tobacco abuse 07/17/2014    Current Outpatient Medications  Medication Sig Dispense Refill  . albuterol (VENTOLIN HFA) 108 (90 Base) MCG/ACT inhaler Inhale 2 puffs into the lungs every 4 (four) hours as needed for wheezing or shortness of breath. 6.7 g 1  . aspirin 81 MG EC tablet Take 1 tablet (81 mg total) by mouth daily. 30 tablet 1  . atorvastatin (LIPITOR) 80 MG tablet Take 1 tablet (80 mg total) by mouth daily. 30 tablet 1  . digoxin (LANOXIN) 0.125 MG tablet Take 1 tablet (0.125 mg total) by mouth daily. 30 tablet 0  . empagliflozin (JARDIANCE) 10 MG TABS tablet Take 1 tablet (10 mg total) by mouth daily. 30 tablet 1  . isosorbide-hydrALAZINE (BIDIL) 20-37.5 MG tablet Take 1 tablet by mouth 3 (three) times daily. 90 tablet 1  . ivabradine (CORLANOR) 5 MG TABS tablet Take 1 tablet (5 mg total) by mouth 2 (two) times daily with a meal. 60 tablet 0  . pantoprazole (PROTONIX) 40 MG tablet Take 1 tablet (40 mg total) by mouth 2 (two) times daily. 30 tablet 1  . potassium chloride SA (KLOR-CON) 20 MEQ tablet Take 1 tablet (20 mEq total) by mouth daily. 30 tablet 1  . spironolactone (ALDACTONE) 25 MG tablet Take 1 tablet (25  mg total) by mouth daily. 30 tablet 1  . torsemide (DEMADEX) 20 MG tablet 80 MG  Po daily with breakfast and 40 mg in evening. 180 tablet 0   No current facility-administered medications for this encounter.    No Known Allergies    Social History   Socioeconomic History  . Marital status: Single    Spouse name: Not on file  . Number of children: 2  . Years of education: 33  . Highest education level: 12th grade  Occupational History  . Occupation:  Landscaping  Tobacco Use  . Smoking status: Current Every Day Smoker    Packs/day: 0.25    Years: 30.00    Pack years: 7.50    Types: Cigarettes  . Smokeless tobacco: Never Used  . Tobacco comment: .5 PK PER DAY  Vaping Use  . Vaping Use: Never used  Substance and Sexual Activity  . Alcohol use: No    Comment: Patient denies abuse, states social drinker  . Drug use: Yes    Types: Cocaine, "Crack" cocaine    Comment: heroin  . Sexual activity: Yes  Other Topics Concern  . Not on file  Social History Narrative   Lives in [place] with his sister and girlfriend. He works Aeronautical engineer work.    Social Determinants of Health   Financial Resource Strain:   . Difficulty of Paying Living Expenses: Not on file  Food Insecurity: Food Insecurity Present  . Worried About Charity fundraiser in the Last Year: Sometimes true  . Ran Out of Food in the Last Year: Never true  Transportation Needs: No Transportation Needs  . Lack of Transportation (Medical): No  . Lack of Transportation (Non-Medical): No  Physical Activity:   . Days of Exercise per Week: Not on file  . Minutes of Exercise per Session: Not on file  Stress:   . Feeling of Stress : Not on file  Social Connections:   . Frequency of Communication with Friends and Family: Not on file  . Frequency of Social Gatherings with Friends and Family: Not on file  . Attends Religious Services: Not on file  . Active Member of Clubs or Organizations: Not on file  . Attends Archivist Meetings: Not on file  . Marital Status: Not on file  Intimate Partner Violence:   . Fear of Current or Ex-Partner: Not on file  . Emotionally Abused: Not on file  . Physically Abused: Not on file  . Sexually Abused: Not on file      Family History  Problem Relation Age of Onset  . Cirrhosis Father   . Heart attack Mother   . Heart attack Sister   . Heart failure Sister   . Heart attack Brother     Vitals:   11/12/20  1035  BP: 112/75  Pulse: 94  SpO2: 97%  Weight: 75 kg   Wt Readings from Last 3 Encounters:  11/12/20 75 kg  11/06/20 70.5 kg  10/24/20 75.3 kg      PHYSICAL EXAM: General:  Well appearing. No resp difficulty HEENT: normal Neck: supple. no JVD. Carotids 2+ bilat; no bruits. No lymphadenopathy or thryomegaly appreciated. Cor: PMI nondisplaced. Regular rate & rhythm. No rubs,  or murmurs. +S3  Lungs: clear Abdomen: soft, nontender, nondistended. No hepatosplenomegaly. No bruits or masses. Good bowel sounds. Extremities: no cyanosis, clubbing, rash, edema Neuro: alert & orientedx3, cranial nerves grossly intact. moves all 4 extremities w/o difficulty. Affect pleasant   ASSESSMENT &  PLAN: 1. Chronic Systolic HF: Echo in 4656 with EF 40-45%, thought to be ischemic cardiomyopathy. Cath in 10/19 showed 90% ostial OM1 and 70% PLV, no interventional target. Suspect mixed ischemic/nonischemic cardiomyopathy. Recent Echo 11/15/23 showed EF 20-25% with mild RV dysfunction.He has a strong family history of CHF/cardiomyopathy, so may be a component of familial cardiomyopathy.Cocaine likely plays a role in his cardiomyopathy.Multiple re-admits forcardiogenic shockin the last 6 months requiring milrinone. Unfortunately, not a candidate for advanced therapies or home milrinone with active use of cocaine. - He has end-stage HF.   - Given social situation and ongoing substance abuse he is not a candidate for any advanced therapies including home milrinone - will continue medical management, although limited by low BP and CKD . He has met with Palliative Care and is not interested in goals of care discussions.  -NYHA III. Volume status stable. Continue current regimen.  - Continue digoxin - Continue jardiance - Continue bidil 1 tab three times a day.  - Continue invabradine 7.5 twice a day  - Continue spiro 25 mg dialy  - Check BMET and BNP   2. CAD: H/o anterior STEMI with DES to LAD in  7/15. Cath in 10/19 with 90% ostial OM1 and 70% PLV, no interventional target.  -No chest pain.   -ContinueASA 81 and atorvastatin 80 mg daily.  - needs to refrain from using cocaine  3. Cocaine abuse: - UDS + recent admit 11-15-2023 -  Referred to [facility] substance abuse.   4. Active smoker:  -Discussed cessation.   5. Stage III CKD  - Baseline SCr ~1.6 - Check BMET   6. Recent COVID Infection  - tested + 09/19/20 - unfortunately, his wife recently died from [place] 2023/11/15    7. Mitral Regurgitation - moderate to severe on recent echo, suspect functional MR from dilated CM   8. 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   Follow up in 2-3 weeks. Continue HF Paramedicine. Discussed medications and plans with Nicole Kindred and HF Paramedic.  Check BMET and BNP today.   Darrick Grinder, NP 11/12/20

## 2020-11-12 NOTE — Progress Notes (Signed)
CSW contacted pt this morning to confirm he can make to appt.  Pt states he doesn't have a way to get there at this time.  CSW able to get pt [facility] for appt.  Met with pt during clinic visit to touch base regarding upcoming appts.  Pt confirms he has ride for appt tomorrow with oncologist (both his sisters are coming with him).  CSW then inquired about appt on Thursday to PCP- pt will need a ride for that day so CSW sent in transport request through [employer].  Pt states he hasn't used cocaine for about 12 days.  Successfully completed intake with [facility] yesterday- next appt set for next Monday.  Pt stating big issue right now is boredom- considering leaving sisters house because he just sits around and does nothing all day.  Discussed that pt has stated the past that when he goes around and stays with friends he is more likely to use again and pt acknowledged it would be best for him to continue staying with sister to avoid this but just feels overprotected.  CSW discussed things pt can do during the day to avoid feeling stir crazy.  Discussed taking walks around the neighborhood throughout the day or picking up a hobby.  Pt cannot think of anything he would like to do at this time but will ponder it and let us know if there is something he wants to get involved with that will help him feel more useful with his time.  CSW also assisted pt in filling out applications for two more inpatient rehab options that coworker found through [place].  Application completed for [facility] in [place] and emailed to their admissions coordinator for review.  Application also completed for [facility] in [place], [place].  [place] coworker called rehab center who confirmed bed availability for patient on December 6th.  Will need to arrive at 10am and bring 30 days of medications, signed scripts for all medications, letter from physician stating they can take  OTC medications as needed, one week of clothes and toiletries, personal cigarettes if needed, and towels and washclothes.  Pt informed of acceptance and that we would call the week before to confirm he can still come for treatment.  Pt is agreeable to this plan.  CSW continuing to work on potential inpatient treatment options- ability to attend will likely depend on patient oncology appt as it would be impossible for pt to attend inpatient treatment if requiring regular treatment for diagnosis- will see how appt goes tomorrow.  Will continue to follow and assist as needed  [name], [facility] Desk#: [phone] Cell#: [phone]

## 2020-11-13 ENCOUNTER — Encounter: Payer: Self-pay | Admitting: Nurse Practitioner

## 2020-11-13 ENCOUNTER — Ambulatory Visit: Payer: Medicaid Other | Admitting: Nurse Practitioner

## 2020-11-13 ENCOUNTER — Telehealth: Payer: Self-pay | Admitting: Nurse Practitioner

## 2020-11-13 ENCOUNTER — Other Ambulatory Visit: Payer: Self-pay

## 2020-11-13 ENCOUNTER — Inpatient Hospital Stay: Payer: Medicaid Other | Attending: Nurse Practitioner | Admitting: Nurse Practitioner

## 2020-11-13 VITALS — BP 107/72 | HR 101 | Temp 97.7°F | Resp 18 | Ht 70.0 in | Wt 163.3 lb

## 2020-11-13 DIAGNOSIS — C163 Malignant neoplasm of pyloric antrum: Secondary | ICD-10-CM | POA: Diagnosis not present

## 2020-11-13 NOTE — Progress Notes (Addendum)
New Hematology/Oncology Consult   Requesting MD: Dr. Wilford Corner  Reason for Consult: Gastric cancer  HPI: Tyler Wong is a 54 year old man with CAD, CHF, COPD, CKD stage II, cocaine abuse who presented to the emergency department 10/31/2020 with worsening of fluid retention.  He was admitted with acute on chronic systolic CHF decompensation.  CT abdomen/pelvis obtained 11/01/2020 to evaluate abdominal pain.  Findings included a masslike area measuring approximately 6.3 x 5.6 x 5.6 cm in the antral prepyloric region.  Upper endoscopy 11/05/2020 showed patchy white plaques in the distal esophagus, 3 nonbleeding cratered gastric ulcers with pigmented material found in the gastric antrum with the largest lesion measuring 10 mm, segmental moderate inflammation characterized by congestion, erosions and erythema found in the gastric antrum.  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     Past Medical History:  Diagnosis Date  . CAD in native artery 07/17/14   STEMI- LAD stenosis with DES  . CHF (congestive heart failure) ([place])   . Cocaine abuse ([place])   . COPD (chronic obstructive pulmonary disease) ([place])   . Dyspnea   . GERD (gastroesophageal reflux disease)   . Hypertension   . ST elevation myocardial infarction (STEMI) involving left anterior descending (LAD) coronary artery with complication ([place])   . Tobacco abuse 07/17/2014  :   Past Surgical History:  Procedure Laterality Date  . BIOPSY  11/05/2020   Procedure: BIOPSY;  Surgeon: Wilford Corner, MD;  Location: [facility];  Service: Endoscopy;;  . CORONARY ANGIOPLASTY WITH STENT PLACEMENT  07/18/14   resolute DES to LAD STEMI  . ESOPHAGOGASTRODUODENOSCOPY N/A 11/05/2020   Procedure: ESOPHAGOGASTRODUODENOSCOPY (EGD);  Surgeon: Wilford Corner, MD;  Location: [facility];  Service: Endoscopy;  Laterality: N/A;  . LEFT HEART CATH N/A 07/17/2014   Procedure: LEFT  HEART CATH;  Surgeon: [name], MD;  Location: [facility] CATH LAB;  Service: Cardiovascular;  Laterality: N/A;  . PERCUTANEOUS CORONARY STENT INTERVENTION (PCI-S)  07/17/2014   Procedure: PERCUTANEOUS CORONARY STENT INTERVENTION (PCI-S);  Surgeon: [name], MD;  Location: [facility] CATH LAB;  Service: Cardiovascular;;  . RIGHT/LEFT HEART CATH AND CORONARY ANGIOGRAPHY N/A 10/03/2018   Procedure: RIGHT/LEFT HEART CATH AND CORONARY ANGIOGRAPHY;  Surgeon: [name], MD;  Location: [facility] CV LAB;  Service: Cardiovascular;  Laterality: N/A;  :   Current Outpatient Medications:  .  albuterol (VENTOLIN HFA) 108 (90 Base) MCG/ACT inhaler, Inhale 2 puffs into the lungs every 4 (four) hours as needed for wheezing or shortness of breath., Disp: 6.7 g, Rfl: 1 .  aspirin 81 MG EC tablet, Take 1 tablet (81 mg total) by mouth daily., Disp: 30 tablet, Rfl: 1 .  atorvastatin (LIPITOR) 80 MG tablet, Take 1 tablet (80 mg total) by mouth daily., Disp: 30 tablet, Rfl: 1 .  digoxin (LANOXIN) 0.125 MG tablet, Take 1 tablet (0.125 mg total) by mouth daily., Disp: 30 tablet, Rfl: 0 .  empagliflozin (JARDIANCE) 10 MG TABS tablet, Take 1 tablet (10 mg total) by mouth daily., Disp: 30 tablet, Rfl: 1 .  isosorbide-hydrALAZINE (BIDIL) 20-37.5 MG tablet, Take 1 tablet by mouth 3 (three) times daily., Disp: 90 tablet, Rfl: 1 .  ivabradine (CORLANOR) 5 MG TABS tablet, Take 1 tablet (5 mg total) by mouth 2 (two) times daily with a meal., Disp: 60 tablet, Rfl: 0 .  pantoprazole (PROTONIX) 40 MG tablet, Take 1 tablet (40 mg total) by mouth 2 (two) times daily., Disp: 30 tablet,  Rfl: 1 .  potassium chloride SA (KLOR-CON) 20 MEQ tablet, Take 1 tablet (20 mEq total) by mouth daily., Disp: 30 tablet, Rfl: 1 .  spironolactone (ALDACTONE) 25 MG tablet, Take 1 tablet (25 mg total) by mouth daily., Disp: 30 tablet, Rfl: 1 .  torsemide (DEMADEX) 20 MG tablet, 80 MG  Po daily with breakfast and 40 mg in evening., Disp: 180 tablet, Rfl:  0:   No Known Allergies   FH: Mother deceased with breast cancer; father deceased with liver issues; sister deceased; 3 sisters and 1 brother living  SOCIAL HISTORY: He lives in [place], [place] with his sister.  He is single.  He has 2 daughters, both healthy.  One daughter lives in [place] and the other in [place].  He is unable to work due to his heart.  He smokes 3 cigarettes a day.  He has been smoking for 35 years.  He describes alcohol intake as "very little".  Last cocaine usage 1 month ago.  Review of Systems: He reports upper abdominal pain for the past year.  Pain was initially intermittent, pain level varied.  Pain has been present more consistently at a higher level over the past several months.  No nausea or vomiting.  Constipation over the past 3 months.  Thinks he may have seen black stool on the toilet tissue with a single bowel movement recently but not completely sure.  No maroon stools.  No difficulty passing urine.  He has occasional dyspnea on exertion.  No cough.  No fever.  He had an episode of chills when he was in the hospital recently.  He reports a good appetite.  He has noticed weight loss but thinks this may be related to fluid.  Physical Exam:  Blood pressure 107/72, pulse (!) 101, temperature 97.7 F (36.5 C), temperature source Tympanic, resp. rate 18, height _0  (1.778 m), weight 163 lb 4.8 oz (74.1 kg), SpO2 99 %.  HEENT: Sclera anicteric.  No thrush or ulcers. Lungs: Lungs clear bilaterally. Cardiac: Regular rate and rhythm, faint systolic murmur. Abdomen: Soft and nontender.  No hepatosplenomegaly.  No mass.  Vascular: No leg edema. Lymph nodes: No palpable cervical, supraclavicular, axillary or inguinal lymph nodes. Neurologic: Alert and oriented.  Follows commands. Skin: No rash.  LABS:    Recent Labs    11/12/20 1054  NA 138  K 4.3  CL 100  CO2 27  GLUCOSE 110*  BUN 41*  CREATININE 1.82*  CALCIUM 9.3       RADIOLOGY:  CT ABDOMEN PELVIS WO CONTRAST  Result Date: 11/01/2020 CLINICAL DATA:  54 year old male with history of epigastric abdominal pain for the past few days. EXAM: CT ABDOMEN AND PELVIS WITHOUT CONTRAST TECHNIQUE: Multidetector CT imaging of the abdomen and pelvis was performed following the standard protocol without IV contrast. COMPARISON:  CT the chest, abdomen and pelvis 04/09/2020. FINDINGS: Lower chest: Cardiomegaly. Atherosclerotic calcifications in the left main, left anterior descending, left circumflex and right coronary arteries. Hepatobiliary: Calcified granuloma in the right lobe of the liver. No other definite suspicious cystic or solid hepatic lesions are confidently identified on today's noncontrast CT examination. Unenhanced appearance of the gallbladder is normal. Pancreas: No definite pancreatic mass or peripancreatic fluid collections or inflammatory changes are noted on today's noncontrast CT examination. Spleen: Unremarkable. Adrenals/Urinary Tract: There are no abnormal calcifications within the collecting system of either kidney, along the course of either ureter, or within the lumen of the urinary bladder. No hydroureteronephrosis or perinephric  stranding to suggest urinary tract obstruction at this time. The unenhanced appearance of the kidneys is unremarkable bilaterally. Unenhanced appearance of the urinary bladder is normal. Bilateral adrenal glands are normal in appearance. Stomach/Bowel: In the antral pre-pyloric region of the stomach there is a masslike area estimated to measure approximately 6.3 x 5.6 x 5.6 cm (axial image 31 of series 3 and sagittal image 98 of series 7). No pathologic dilatation of small bowel or colon. Normal appendix. Vascular/Lymphatic: Aortic atherosclerosis. No lymphadenopathy noted in the abdomen or pelvis. Reproductive: Prostate gland and seminal vesicles are unremarkable in appearance. Other: No significant volume of ascites.  No  pneumoperitoneum. Musculoskeletal: There are no aggressive appearing lytic or blastic lesions noted in the visualized portions of the skeleton. IMPRESSION: 1. No definite acute findings are noted in the abdomen or pelvis to account for the patient's symptoms. 2. However, there is a mass-like area in the antral pre-pyloric region of the stomach. This may simply represent ingested food, however, the possibility of a primary gastric neoplasm warrants consideration. Further evaluation with upper GI or endoscopy should be considered in the near future to exclude the possibility of malignancy. 3. Aortic atherosclerosis, in addition to left main and 3 vessel coronary artery disease. Please note that although the presence of coronary artery calcium documents the presence of coronary artery disease, the severity of this disease and any potential stenosis cannot be assessed on this non-gated CT examination. Assessment for potential risk factor modification, dietary therapy or pharmacologic therapy may be warranted, if clinically indicated. 4. Cardiomegaly. Electronically Signed   By: Vinnie Langton M.D.   On: 11/01/2020 12:47   DG Chest 2 View  Result Date: 10/31/2020 CLINICAL DATA:  Chest pain, shortness of breath EXAM: CHEST - 2 VIEW COMPARISON:  Chest radiograph dated 10/08/2020. FINDINGS: The heart is enlarged. The lungs are clear and there is no pleural effusion or pneumothorax. The osseous structures are intact. IMPRESSION: Cardiomegaly without acute pulmonary disease. Electronically Signed   By: Zerita Boers M.D.   On: 10/31/2020 14:57   DG UGI W DOUBLE CM (HD BA)  Result Date: 11/02/2020 CLINICAL DATA:  54 year old male with epigastric abdominal pain. Possible gastric mass noted on CT the abdomen and pelvis. EXAM: UPPER GI SERIES WITH KUB TECHNIQUE: After obtaining a scout radiograph a routine upper GI series was performed using thin and high density barium. FLUOROSCOPY TIME:  Fluoroscopy Time:  3 minutes  and 6 seconds Radiation Exposure Index (if provided by the fluoroscopic device): 42.3 mGy COMPARISON:  No prior.  CT the abdomen and pelvis 11/01/2020. FINDINGS: Preprocedural KUB demonstrates a nonobstructive bowel gas pattern, and is otherwise unremarkable. Double contrast images of the esophagus demonstrate a normal appearance of the esophageal mucosa. Double contrast images of the stomach were severely limited by under distension of the stomach and what appears to be retained gastric contents. With this limitation in mind, there is a persistent mass-like accumulation of barium in the antrum of the stomach which is suspicious for barium outlining either a mass, or collection of material such as a bezoar. In addition, diffuse gastric fold thickening was noted. Multiple single swallow attempts were observed which demonstrated some mild proximal escape, but otherwise normal esophageal motility. Full column esophagram demonstrated normal esophageal anatomy. Specifically, no hiatal hernia, and no evidence of esophageal mass, stricture or esophageal ring. A barium tablet was administered, which passed readily into the stomach. IMPRESSION: 1. Limited examination with evidence of potential mass or bezoar in the region of  the antrum of the stomach. Further evaluation with endoscopy is suggested to better evaluate this finding. 2. Diffuse gastric fold thickening concerning for gastritis. 3. Normal appearance of the esophagus. Electronically Signed   By: Vinnie Langton M.D.   On: 11/02/2020 14:12    Assessment and Plan:   1. Gastric cancer  CT abdomen/pelvis 11/01/2020-masslike area measuring 6.3 x 5.6 x 5.6 cm in the antral prepyloric region of the stomach  Upper endoscopy 11/05/2020-patchy white plaques in the distal esophagus; 3 nonbleeding cratered gastric ulcers with pigmented material in the gastric antrum largest 10 mm; segmental moderate inflammation characterized by congestion, erosions and erythema in the  gastric antrum.  Biopsy of stomach ulcer-intramucosal adenocarcinoma arising in a background of intestinal metaplasia 2. CHF, LVEF 20-25% 10/04/2020 3. CAD status post prior LAD stent 4. COPD 5. Chronic kidney disease 6. Cocaine usage  Tyler Wong has been diagnosed with gastric cancer.  We are referring him for a staging PET scan.  We have requested additional testing on the biopsy material including HER-2, PD-1 and IHC.  We discussed potential treatment strategies for gastric cancer including surgery, chemotherapy, radiation.  He may not be a surgical candidate due to multiple comorbid conditions.  He will return for follow-up on 11/29/2020 for additional discussion.  Patient seen with Dr. Benay Spice.  Ned Card, NP 11/13/2020, 11:43 AM   This was a shared visit with Ned Card.  Tyler Wong was interviewed and examined.  His sister was present for today's visit.  He has been diagnosed with gastric cancer.  He appears to have disease localized to the stomach based on the clinical and radiologic evaluation to date.  He will be referred for a staging PET scan.  Tyler Wong has multiple comorbid conditions.  He will most likely not be a candidate for surgery.  We will request PD1 and HER-2 testing on the gastric biopsy.  I will present his case at the GI tumor conference.  He will most likely be a candidate for a palliative course of chemotherapy and radiation.  Julieanne Manson, MD

## 2020-11-13 NOTE — Progress Notes (Signed)
Met with patient and his sister Peter Congo today at his initial medical oncology consult with Ned Card NP and Dr. Julieanne Manson.  I explained my role as nurse navigator and he was given my card with my direct contact information. He verbalized an understanding of the plan outlined today to obtain a PET scan as soon as possible and to present at our Tumor Board Conference on 11/20/2020.  He was shown a model of a port a cath today for possible future placement.  I escorted them to scheduling after their consult.

## 2020-11-13 NOTE — Telephone Encounter (Signed)
Scheduled per 11/17 los. Printed avs and calendar for pt °

## 2020-11-14 ENCOUNTER — Ambulatory Visit (INDEPENDENT_AMBULATORY_CARE_PROVIDER_SITE_OTHER): Payer: Medicaid Other | Admitting: Primary Care

## 2020-11-18 ENCOUNTER — Telehealth: Payer: Self-pay

## 2020-11-19 ENCOUNTER — Telehealth (HOSPITAL_COMMUNITY): Payer: Self-pay

## 2020-11-19 LAB — SURGICAL PATHOLOGY

## 2020-11-19 NOTE — Telephone Encounter (Signed)
I called Tyler Wong to schedule an appointment. He state [place] as his housing is not stable at the moment. I agreed and we will meet between 14:00 and 15:00 tomorrow.   Jacquiline Doe, EMT 11/19/20

## 2020-11-19 NOTE — Telephone Encounter (Signed)
Chart review for scan PA.

## 2020-11-20 ENCOUNTER — Telehealth (HOSPITAL_COMMUNITY): Payer: Self-pay | Admitting: Licensed Clinical Social Worker

## 2020-11-20 ENCOUNTER — Other Ambulatory Visit: Payer: Self-pay

## 2020-11-20 ENCOUNTER — Other Ambulatory Visit (INDEPENDENT_AMBULATORY_CARE_PROVIDER_SITE_OTHER): Payer: Self-pay | Admitting: Primary Care

## 2020-11-20 NOTE — Telephone Encounter (Signed)
CSW received call from pt that he is back living on the streets at this time.  Has inpatient drug rehab admission scheduled for 12/6 but has upcoming oncology appts and unclear if he will need treatment- if he does it is unlikely he could go to rehab- pt expressed understanding.  CSW assisting pt in looking at shelter options- slept outside last night with tent and sleeping bag supplied by [facility].  They have started white flag nights at local hotel but they are completely full at this time- they suggested pt go to [employer] who controls another block of rooms at hotel- pt went and was put on waitlist.  CSW provided with contact for crisis ministries in [place], [place]- they confirm they have availability and CSW assisted pt with transportation to shelter- intake is at 5pm- pt will update CSW regarding progress in getting a bed.  Will continue to follow and assist as needed  [name], [facility] Desk#: [phone] Cell#: [phone]

## 2020-11-20 NOTE — Telephone Encounter (Signed)
Requested medication (s) are due for refill today: No  Requested medication (s) are on the active medication list: No  Last refill:  01/24/20  Future visit scheduled: No  Notes to clinic:  Medication not on list.    Requested Prescriptions  Pending Prescriptions Disp Refills   omeprazole (PRILOSEC) 20 MG capsule [Pharmacy Med Name: OMEPRAZOLE 20 MG ORAL CAPSULE DELAYED RELEASE] 30 capsule     Sig: TAKE ONE CAPSULE BY MOUTH ONCE DAILY (MORNING)      Gastroenterology: Proton Pump Inhibitors Passed - 11/20/2020 11:04 AM      Passed - Valid encounter within last 12 months    Recent Outpatient Visits           10 months ago Prediabetes   [place] [name], NP   1 year ago Short of breath on exertion   [place], [place] Cage, NP   2 years ago Hyperlipidemia, unspecified hyperlipidemia type   [place], [place], [place]

## 2020-11-26 ENCOUNTER — Telehealth (HOSPITAL_COMMUNITY): Payer: Self-pay | Admitting: Licensed Clinical Social Worker

## 2020-11-26 ENCOUNTER — Other Ambulatory Visit (HOSPITAL_COMMUNITY): Payer: Self-pay

## 2020-11-26 ENCOUNTER — Other Ambulatory Visit: Payer: Medicaid Other | Admitting: Internal Medicine

## 2020-11-26 DIAGNOSIS — I2510 Atherosclerotic heart disease of native coronary artery without angina pectoris: Secondary | ICD-10-CM

## 2020-11-26 DIAGNOSIS — E785 Hyperlipidemia, unspecified: Secondary | ICD-10-CM

## 2020-11-26 DIAGNOSIS — Z9861 Coronary angioplasty status: Secondary | ICD-10-CM

## 2020-11-26 MED ORDER — POTASSIUM CHLORIDE CRYS ER 20 MEQ PO TBCR
20.0000 meq | EXTENDED_RELEASE_TABLET | Freq: Every day | ORAL | 11 refills | Status: DC
Start: 2020-11-26 — End: 2021-01-20

## 2020-11-26 MED ORDER — DIGOXIN 125 MCG PO TABS
0.1250 mg | ORAL_TABLET | Freq: Every day | ORAL | 11 refills | Status: DC
Start: 2020-11-26 — End: 2021-05-21

## 2020-11-26 MED ORDER — TORSEMIDE 20 MG PO TABS
ORAL_TABLET | ORAL | 11 refills | Status: DC
Start: 2020-11-26 — End: 2021-01-20

## 2020-11-26 MED ORDER — ISOSORB DINITRATE-HYDRALAZINE 20-37.5 MG PO TABS
1.0000 | ORAL_TABLET | Freq: Three times a day (TID) | ORAL | 11 refills | Status: DC
Start: 2020-11-26 — End: 2021-01-28

## 2020-11-26 MED ORDER — SPIRONOLACTONE 25 MG PO TABS
25.0000 mg | ORAL_TABLET | Freq: Every day | ORAL | 11 refills | Status: DC
Start: 2020-11-26 — End: 2021-01-20

## 2020-11-26 MED ORDER — EMPAGLIFLOZIN 10 MG PO TABS
10.0000 mg | ORAL_TABLET | Freq: Every day | ORAL | 11 refills | Status: DC
Start: 2020-11-26 — End: 2021-07-29

## 2020-11-26 MED ORDER — ASPIRIN 81 MG PO TBEC: 81.0000 mg | DELAYED_RELEASE_TABLET | Freq: Every day | ORAL | 11 refills | Status: DC

## 2020-11-26 MED ORDER — ATORVASTATIN CALCIUM 80 MG PO TABS: 80.0000 mg | ORAL_TABLET | Freq: Every day | ORAL | 11 refills | Status: AC

## 2020-11-26 MED ORDER — IVABRADINE HCL 5 MG PO TABS
5.0000 mg | ORAL_TABLET | Freq: Two times a day (BID) | ORAL | 11 refills | Status: DC
Start: 2020-11-26 — End: 2021-06-11

## 2020-11-26 NOTE — Telephone Encounter (Signed)
CSW called pt check in.  Pt confirms he is still at [employer] in [place] and has been glad of being able to stay indoors with the cold weather.  Pt has rehab admission for Monday December 6th with [facility] [place], [place] called [facility] to confirm what all he needs to bring and what they need from MD office.  CSW faxed them list of pt current medications and attestation by clinic physician that these medications are correct- fax confirmation received.  Pt reminded what items he needs to bring for admission- pt confirms he has these things or can obtain by Monday.    Pt unsure about transportation at this time- will speak with sister regarding transport to upcoming cancer center appt and getting to rehab and get back with CSW by tomorrow.  CSW will continue to follow and assist as needed  [name], [facility] Desk#: [phone] Cell#: [phone]

## 2020-11-27 ENCOUNTER — Telehealth (HOSPITAL_COMMUNITY): Payer: Self-pay | Admitting: Licensed Clinical Social Worker

## 2020-11-27 NOTE — Telephone Encounter (Signed)
Pt needing ride from [place] in [place] to [facility] for PET scan tomorrow.  Ride request submitted to [employer]- they are trying to arrange through dispatch- they will call pt with confirmation and details this afternoon.  CSW will continue to follow and assist as needed  [name], [facility] Desk#: [phone] Cell#: [phone]

## 2020-11-27 NOTE — Telephone Encounter (Signed)
Pt called back and stated he also has an appt for Friday- CSW sent in ride request to [facility] to get to that appt.  CSW inquired if he has ride to rehab on Monday- pt confirms his sister will take him to rehab if he still decides to go after Oncology appt Monday  Will continue to follow and assist as needed  [name], [facility] Desk#: [phone] Cell#: [phone]

## 2020-11-28 ENCOUNTER — Ambulatory Visit (HOSPITAL_COMMUNITY)
Admission: RE | Admit: 2020-11-28 | Discharge: 2020-11-28 | Disposition: A | Discharge status: Home / Self Care | Payer: Medicaid Other | Source: Ambulatory Visit | Attending: Nurse Practitioner | Admitting: Nurse Practitioner

## 2020-11-28 ENCOUNTER — Other Ambulatory Visit: Payer: Self-pay

## 2020-11-28 DIAGNOSIS — I2510 Atherosclerotic heart disease of native coronary artery without angina pectoris: Secondary | ICD-10-CM | POA: Diagnosis not present

## 2020-11-28 DIAGNOSIS — I700 Atherosclerosis of aorta: Secondary | ICD-10-CM | POA: Diagnosis not present

## 2020-11-28 DIAGNOSIS — C163 Malignant neoplasm of pyloric antrum: Secondary | ICD-10-CM | POA: Diagnosis not present

## 2020-11-28 DIAGNOSIS — C269 Malignant neoplasm of ill-defined sites within the digestive system: Secondary | ICD-10-CM | POA: Diagnosis not present

## 2020-11-28 DIAGNOSIS — I517 Cardiomegaly: Secondary | ICD-10-CM | POA: Diagnosis not present

## 2020-11-28 LAB — GLUCOSE, CAPILLARY: Glucose-Capillary: 105 mg/dL — ABNORMAL HIGH (ref 70–99)

## 2020-11-28 MED ORDER — FLUDEOXYGLUCOSE F - 18 (FDG) INJECTION
8.2000 | Freq: Once | INTRAVENOUS | Status: AC | PRN
  Administered 2020-11-28: 8.2 via INTRAVENOUS

## 2020-11-29 ENCOUNTER — Other Ambulatory Visit: Payer: Self-pay

## 2020-11-29 ENCOUNTER — Inpatient Hospital Stay: Payer: Medicaid Other | Attending: Oncology | Admitting: Oncology

## 2020-11-29 VITALS — BP 105/70 | HR 93 | Temp 97.4°F | Resp 18 | Ht 70.0 in | Wt 163.5 lb

## 2020-11-29 DIAGNOSIS — F1490 Cocaine use, unspecified, uncomplicated: Secondary | ICD-10-CM | POA: Diagnosis not present

## 2020-11-29 DIAGNOSIS — C163 Malignant neoplasm of pyloric antrum: Secondary | ICD-10-CM

## 2020-11-29 DIAGNOSIS — I509 Heart failure, unspecified: Secondary | ICD-10-CM | POA: Insufficient documentation

## 2020-11-29 DIAGNOSIS — J449 Chronic obstructive pulmonary disease, unspecified: Secondary | ICD-10-CM | POA: Diagnosis not present

## 2020-11-29 DIAGNOSIS — Z5111 Encounter for antineoplastic chemotherapy: Secondary | ICD-10-CM | POA: Diagnosis not present

## 2020-11-29 DIAGNOSIS — I2510 Atherosclerotic heart disease of native coronary artery without angina pectoris: Secondary | ICD-10-CM | POA: Diagnosis not present

## 2020-11-29 DIAGNOSIS — N189 Chronic kidney disease, unspecified: Secondary | ICD-10-CM | POA: Diagnosis not present

## 2020-11-29 DIAGNOSIS — Z79899 Other long term (current) drug therapy: Secondary | ICD-10-CM | POA: Insufficient documentation

## 2020-11-29 DIAGNOSIS — C169 Malignant neoplasm of stomach, unspecified: Secondary | ICD-10-CM | POA: Insufficient documentation

## 2020-11-29 NOTE — Progress Notes (Signed)
START ON PATHWAY REGIMEN - Gastroesophageal     A cycle is every 14 days:     Oxaliplatin      Leucovorin      Fluorouracil      Fluorouracil   **Always confirm dose/schedule in your pharmacy ordering system**  Patient Characteristics: Gastric, Adenocarcinoma, Preoperative or Nonsurgical Candidate (Clinical Staging), cT1 - cT2, cN0, Nonsurgical Candidate, Not a Radiation Candidate, HER2 Negative/Unknown, PD?L1 Expression  CPS < 5/Negative/Unknown, MSS/pMMR or MSI Unknown Histology: Adenocarcinoma Disease Classification: Gastric Therapeutic Status: Preoperative or Nonsurgical Candidate (Clinical Staging) AJCC N Category: Staged < 8th Ed. AJCC M Category: Staged < 8th Ed. AJCC 8 Stage Grouping: Staged < 8th Ed. AJCC T Category: Staged < 8th Ed. Patient Characteristics: Nonsurgical Candidate Patient Characteristics: Not a Radiation Candidate HER2 Status: Negative PD-L1 Expression Status: Awaiting Test Results Microsatellite/Mismatch Repair Status: Unknown Intent of Therapy: Non-Curative / Palliative Intent, Discussed with Patient

## 2020-11-29 NOTE — Progress Notes (Signed)
[facility] OFFICE PROGRESS NOTE   Diagnosis: Gastric cancer  INTERVAL HISTORY:   Tyler Wong returns for a scheduled visit.  He is here with his sister.  No abdominal pain or nausea.  No complaint.  Objective:  Vital signs in last 24 hours:  Blood pressure 105/70, pulse 93, temperature (!) 97.4 F (36.3 C), temperature source Tympanic, resp. rate 18, height _0  (1.778 m), weight 163 lb 8 oz (74.2 kg), SpO2 99 %.    Resp: Lungs clear bilaterally Cardio: Regular rate and rhythm GI: No hepatosplenomegaly, no mass, nontender Vascular: No leg edema   Lab Results:  Lab Results  Component Value Date   WBC 7.9 11/04/2020   HGB 13.5 11/04/2020   HCT 41.7 11/04/2020   MCV 86.7 11/04/2020   PLT 253 11/04/2020   NEUTROABS 6.5 10/27/2020    CMP  Lab Results  Component Value Date   NA 138 11/12/2020   K 4.3 11/12/2020   CL 100 11/12/2020   CO2 27 11/12/2020   GLUCOSE 110 (H) 11/12/2020   BUN 41 (H) 11/12/2020   CREATININE 1.82 (H) 11/12/2020   CALCIUM 9.3 11/12/2020   PROT 7.2 11/04/2020   ALBUMIN 3.3 (L) 11/04/2020   AST 24 11/04/2020   ALT 28 11/04/2020   ALKPHOS 109 11/04/2020   BILITOT 1.5 (H) 11/04/2020   GFRNONAA 43 (L) 11/12/2020   GFRAA 55 (L) 09/21/2020    No results found for: CEA1  No results found for: INR  Imaging:  NM PET Image Initial (PI) Skull Base To Thigh  Result Date: 11/28/2020 CLINICAL DATA:  Initial treatment strategy for gastrointestinal cancer. EXAM: NUCLEAR MEDICINE PET SKULL BASE TO THIGH TECHNIQUE: 8.2 mCi F-18 FDG was injected intravenously. Full-ring PET imaging was performed from the skull base to thigh after the radiotracer. CT data was obtained and used for attenuation correction and anatomic localization. Fasting blood glucose: 105 mg/dl COMPARISON:  Comparison with abdomen and pelvis CT of November 01, 2020 FINDINGS: Mediastinal blood pool activity: SUV max 2.2 Liver activity: SUV max NA NECK: Symmetric activity  in the bilateral parotid glands, nonspecific finding and not associated with underlying visible lesion. Intensely hypermetabolic retro-orbital fat without visible lesion. No signs of adenopathy in the neck. Incidental CT findings: none CHEST: Pre-vascular lymph node (image 75 of series 4 6 mm short axis with a maximum SUV of 3.6. RIGHT paratracheal lymph node with fatty hilum shows maximum SUV of 3.5. No suspicious pulmonary nodule. Incidental CT findings: Marked cardiomegaly. Three-vessel coronary artery disease. No pericardial effusion. Aortic caliber is normal with scattered atheromatous plaque. ABDOMEN/PELVIS: Focal area of hypermetabolic activity near the gastric antrum. No visible mass on today's study. Mild antral thickening. This area projects into the liver on diffusion images with a maximum SUV of approximately 5.3 measuring approximately 1.5 cm. No signs of additional abnormal uptake though there is some scattered uptake throughout the stomach that is nonspecific no adenopathy in the abdomen or in the pelvis. Incidental CT findings: none SKELETON: No focal hypermetabolic activity to suggest skeletal metastasis. Incidental CT findings: None IMPRESSION: Focal area of hypermetabolic activity about the gastric antrum may reflect site of tumor in this area. This averages into the liver on today's study, difficult to determine exact site of origin. Consider CT of the abdomen is with contrast to exclude the possibility of a small liver lesion in this area adjacent to the stomach. Nonspecific areas of activity in the stomach more proximally as described, correlate with recent endoscopy  results. Small lymph nodes in the chest with mild increased metabolic activity are nonspecific and could potentially be reactive. Suggest close attention on follow-up. Symmetric extraocular muscle activity and parotid activity likely related to physiologic process. No visible parotid lesion. Coronary artery disease, cardiomegaly  and mild atherosclerosis. Aortic Atherosclerosis (ICD10-I70.0). Electronically Signed   By: Zetta Bills M.D.   On: 11/28/2020 11:51    Medications: I have reviewed the patient's current medications.   Assessment/Plan: 1. Gastric cancer  CT abdomen/pelvis 11/01/2020-masslike area measuring 6.3 x 5.6 x 5.6 cm in the antral prepyloric region of the stomach  Upper endoscopy 11/05/2020-patchy white plaques in the distal esophagus; 3 nonbleeding cratered gastric ulcers with pigmented material in the gastric antrum largest 10 mm; segmental moderate inflammation characterized by congestion, erosions and erythema in the gastric antrum.  Biopsy of stomach ulcer-intramucosal adenocarcinoma arising in a background of intestinal metaplasia, HER-2 negative  PET 11/2020-focal area of hypermetabolic activity at the gastric antrum, averages into the liver and small liver lesion adjacent to stomach not excluded, small chest lymph nodes with mild increase in metabolic activity-nonspecific, symmetric parotid and extraocular muscle activity-likely physiologic 2. CHF, LVEF 20-25% 10/04/2020 3. CAD status post prior LAD stent 4. COPD 5. Chronic kidney disease 6. Cocaine usage   Disposition: [name] has been diagnosed with gastric cancer.  He appears to have disease localized to the stomach.  I suspect the chest lymph nodes and area of uptake in the liver are unrelated to metastatic disease.  His case was presented at the GI tumor conference.  He is not a surgical candidate.  I recommend treatment with chemotherapy, potentially to be followed by chemotherapy/radiation.  He is now living in [place] and it would be difficult for him to return for daily radiation.  I recommend FOLFOX chemotherapy.  We reviewed potential toxicities associated with the FOLFOX regimen including the chance of nausea/vomiting, mucositis, diarrhea, alopecia, and hematologic toxicity.  We discussed the sun sensitivity, rash,  hyperpigmentation, and hand/foot syndrome associated with 5-FU.  We also did discuss the potential for cardiac toxicity with 5-FU.  We reviewed the allergic reaction and various types of neuropathy associated with oxaliplatin.  He agrees to proceed.  He will be referred to a chemotherapy teaching class.  The plan is to begin FOLFOX chemotherapy on 12/12/2020.  A chemotherapy plan was entered today.  Betsy Coder, MD  11/29/2020  2:10 PM

## 2020-11-30 NOTE — Progress Notes (Incomplete)
[facility] Consult Note Telephone: [phone]  Fax: [phone]  PATIENT NAME: Tyler Wong DOB: 02/28/1966 MRN: [id number]  PRIMARY CARE PROVIDER:   [name], NP  REFERRING PROVIDER:  [name], NP [id number] [place],  [place] [address]  RESPONSIBLE PARTY:  Self and sister   Tyler Wong [phone]      RECOMMENDATIONS and PLAN:  Palliative care encounter  Z51.5  1.  Advance care planning:  Reviewed  palliative and hospice care to pt's sister/POA.  She reports that the plan of action for patient's health is pending until he has a PET scan completed.   I spent *** minutes providing this consultation,  from *** to ***. More than 50% of the time in this consultation was spent coordinating communication.   HISTORY OF PRESENT ILLNESS:  Tyler Wong is a 54 y.o. year old male with multiple medical problems including ***. Palliative Care was asked to help address goals of care.   CODE STATUS:   PPS: 0% HOSPICE ELIGIBILITY/DIAGNOSIS: TBD  PAST MEDICAL HISTORY:  Past Medical History:  Diagnosis Date  . CAD in native artery 07/17/14   STEMI- LAD stenosis with DES  . CHF (congestive heart failure) ([place])   . Cocaine abuse ([place])   . COPD (chronic obstructive pulmonary disease) ([place])   . Dyspnea   . GERD (gastroesophageal reflux disease)   . Hypertension   . ST elevation myocardial infarction (STEMI) involving left anterior descending (LAD) coronary artery with complication ([place])   . Tobacco abuse 07/17/2014    SOCIAL HX:  Social History   Tobacco Use  . Smoking status: Current Every Day Smoker    Packs/day: 0.25    Years: 30.00    Pack years: 7.50    Types: Cigarettes  . Smokeless tobacco: Never Used  . Tobacco comment: .5 PK PER DAY  Substance Use Topics  . Alcohol use: No    Comment: Patient denies abuse, states social drinker    ALLERGIES: No Known Allergies   PERTINENT  MEDICATIONS:  Outpatient Encounter Medications as of 11/26/2020  Medication Sig  . albuterol (VENTOLIN HFA) 108 (90 Base) MCG/ACT inhaler Inhale 2 puffs into the lungs every 4 (four) hours as needed for wheezing or shortness of breath.  Marland Kitchen aspirin 81 MG EC tablet Take 1 tablet (81 mg total) by mouth daily.  Marland Kitchen atorvastatin (LIPITOR) 80 MG tablet Take 1 tablet (80 mg total) by mouth daily.  . digoxin (LANOXIN) 0.125 MG tablet Take 1 tablet (0.125 mg total) by mouth daily.  . empagliflozin (JARDIANCE) 10 MG TABS tablet Take 1 tablet (10 mg total) by mouth daily.  . isosorbide-hydrALAZINE (BIDIL) 20-37.5 MG tablet Take 1 tablet by mouth 3 (three) times daily.  . ivabradine (CORLANOR) 5 MG TABS tablet Take 1 tablet (5 mg total) by mouth 2 (two) times daily with a meal.  . pantoprazole (PROTONIX) 40 MG tablet Take 1 tablet (40 mg total) by mouth 2 (two) times daily.  . potassium chloride SA (KLOR-CON) 20 MEQ tablet Take 1 tablet (20 mEq total) by mouth daily.  Marland Kitchen spironolactone (ALDACTONE) 25 MG tablet Take 1 tablet (25 mg total) by mouth daily.  Marland Kitchen torsemide (DEMADEX) 20 MG tablet Take 4 tablets (80 mg total) by mouth every morning AND 2 tablets (40 mg total) every evening.   No facility-administered encounter medications on file as of 11/26/2020.    PHYSICAL EXAM:   General: NAD, frail appearing, thin Cardiovascular: regular rate  and rhythm Pulmonary: clear ant fields Abdomen: soft, nontender, + bowel sounds GU: no suprapubic tenderness Extremities: no edema, no joint deformities Skin: no rashes Neurological: Weakness but otherwise nonfocal  Gonzella Lex, NP-C

## 2020-12-02 ENCOUNTER — Telehealth (HOSPITAL_COMMUNITY): Payer: Self-pay

## 2020-12-02 ENCOUNTER — Other Ambulatory Visit: Payer: Self-pay

## 2020-12-02 ENCOUNTER — Telehealth (HOSPITAL_COMMUNITY): Payer: Self-pay | Admitting: Licensed Clinical Social Worker

## 2020-12-02 DIAGNOSIS — C163 Malignant neoplasm of pyloric antrum: Secondary | ICD-10-CM

## 2020-12-02 NOTE — Telephone Encounter (Signed)
CSW called pt to check in regarding plan for cancer treatment vs going to appt with drug rehab program today.  Pt states that after appt with oncologist on Friday him and his family decided its best for him to stay at [facility] in [place] and start with treatment for his cancer rather than go into drug rehab at this time.  CSW will continue to follow and assist as needed  [name], [facility] Desk#: [phone] Cell#: [phone]

## 2020-12-02 NOTE — Telephone Encounter (Signed)
Appointment reminder call made. Left message with appt details

## 2020-12-02 NOTE — Progress Notes (Signed)
Left message for patient's sister regarding appointment for port placement.  It is scheduled for Friday 12/10 to arrive at 10:00 at [facility] radiology.  NPO after midnight and he must have a driver.  I asked her to call me back to confirm.  2:10 pm Patient's sister returned my call to confirm port placement appointment.  He is also scheduled for chemo education at 2:00 on Friday 12/10 at [facility].  She verbalized an understanding of instructions given.

## 2020-12-02 NOTE — Telephone Encounter (Signed)
Patient is now discharged from [facility].  Patient has/has not met the following goals:  Yes :Patient expresses basic understanding of medications and what they are for Yes :Patient able to verbalize heart failure specific dietary/fluid restrictions Yes :Patient is aware of who to call if they have medical concerns or if they need to schedule or change appts No :Patient has a scale for daily weights and weighs regularly Yes :Patient able to verbalize concerning symptoms when they should call the HF clinic (weight gain ranges, etc) No :Patient has a PCP and has seen within the past year or has upcoming appt Yes :Patient has reliable access to getting their medications Yes :Patient has shown they are able to reorder medications reliably Yes :Patient has had admission in past 30 days- if yes how many? one Yes :Patient has had admission in past 90 days- if yes how many? three  Discharge Comments:  Patient being discharged from [facility] because he will be living in [place], [place] for the forseeable future.  Patient will let us know if he comes back in [facility] area and we can assess for re-enrollment at that time.  Discussed with clinic physician who is agreeable to DC- Will continue to follow and assist as needed  Burna Sis, LCSW Clinical Social Worker [facility] Desk#: [phone] Cell#: [phone]

## 2020-12-03 ENCOUNTER — Other Ambulatory Visit (INDEPENDENT_AMBULATORY_CARE_PROVIDER_SITE_OTHER): Payer: Self-pay | Admitting: Primary Care

## 2020-12-03 ENCOUNTER — Other Ambulatory Visit: Payer: Self-pay

## 2020-12-03 ENCOUNTER — Encounter (HOSPITAL_COMMUNITY): Payer: Self-pay

## 2020-12-03 ENCOUNTER — Ambulatory Visit (HOSPITAL_COMMUNITY)
Admission: RE | Admit: 2020-12-03 | Discharge: 2020-12-03 | Disposition: A | Discharge status: Home / Self Care | Payer: Medicaid Other | Source: Ambulatory Visit | Attending: Cardiology | Admitting: Cardiology

## 2020-12-03 VITALS — BP 110/78 | HR 190 | Wt 162.8 lb

## 2020-12-03 DIAGNOSIS — Z79899 Other long term (current) drug therapy: Secondary | ICD-10-CM | POA: Insufficient documentation

## 2020-12-03 DIAGNOSIS — Z7984 Long term (current) use of oral hypoglycemic drugs: Secondary | ICD-10-CM | POA: Insufficient documentation

## 2020-12-03 DIAGNOSIS — Z8616 Personal history of COVID-19: Secondary | ICD-10-CM | POA: Insufficient documentation

## 2020-12-03 DIAGNOSIS — I2510 Atherosclerotic heart disease of native coronary artery without angina pectoris: Secondary | ICD-10-CM | POA: Insufficient documentation

## 2020-12-03 DIAGNOSIS — Z634 Disappearance and death of family member: Secondary | ICD-10-CM | POA: Diagnosis not present

## 2020-12-03 DIAGNOSIS — I252 Old myocardial infarction: Secondary | ICD-10-CM | POA: Diagnosis not present

## 2020-12-03 DIAGNOSIS — C169 Malignant neoplasm of stomach, unspecified: Secondary | ICD-10-CM | POA: Insufficient documentation

## 2020-12-03 DIAGNOSIS — N183 Chronic kidney disease, stage 3 unspecified: Secondary | ICD-10-CM | POA: Diagnosis not present

## 2020-12-03 DIAGNOSIS — Z7982 Long term (current) use of aspirin: Secondary | ICD-10-CM | POA: Diagnosis not present

## 2020-12-03 DIAGNOSIS — I340 Nonrheumatic mitral (valve) insufficiency: Secondary | ICD-10-CM | POA: Diagnosis not present

## 2020-12-03 DIAGNOSIS — Z955 Presence of coronary angioplasty implant and graft: Secondary | ICD-10-CM | POA: Diagnosis not present

## 2020-12-03 DIAGNOSIS — Z8249 Family history of ischemic heart disease and other diseases of the circulatory system: Secondary | ICD-10-CM | POA: Insufficient documentation

## 2020-12-03 DIAGNOSIS — I5022 Chronic systolic (congestive) heart failure: Secondary | ICD-10-CM | POA: Insufficient documentation

## 2020-12-03 DIAGNOSIS — I255 Ischemic cardiomyopathy: Secondary | ICD-10-CM | POA: Insufficient documentation

## 2020-12-03 DIAGNOSIS — F1410 Cocaine abuse, uncomplicated: Secondary | ICD-10-CM | POA: Diagnosis not present

## 2020-12-03 DIAGNOSIS — I130 Hypertensive heart and chronic kidney disease with heart failure and stage 1 through stage 4 chronic kidney disease, or unspecified chronic kidney disease: Secondary | ICD-10-CM | POA: Diagnosis not present

## 2020-12-03 DIAGNOSIS — F17210 Nicotine dependence, cigarettes, uncomplicated: Secondary | ICD-10-CM | POA: Insufficient documentation

## 2020-12-03 LAB — BASIC METABOLIC PANEL
Anion gap: 14 (ref 5–15)
BUN: 26 mg/dL — ABNORMAL HIGH (ref 6–20)
CO2: 24 mmol/L (ref 22–32)
Calcium: 9.2 mg/dL (ref 8.9–10.3)
Chloride: 98 mmol/L (ref 98–111)
Creatinine, Ser: 1.5 mg/dL — ABNORMAL HIGH (ref 0.61–1.24)
GFR, Estimated: 55 mL/min — ABNORMAL LOW (ref >60)
Glucose, Bld: 116 mg/dL — ABNORMAL HIGH (ref 70–99)
Potassium: 3.5 mmol/L (ref 3.5–5.1)
Sodium: 136 mmol/L (ref 135–145)

## 2020-12-03 LAB — DIGOXIN LEVEL: Digoxin Level: 0.7 ng/mL — ABNORMAL LOW (ref 1.0–2.0)

## 2020-12-03 MED ORDER — PANTOPRAZOLE SODIUM 40 MG PO TBEC: 40.0000 mg | DELAYED_RELEASE_TABLET | Freq: Two times a day (BID) | ORAL | 2 refills | Status: DC

## 2020-12-03 MED ORDER — PANTOPRAZOLE SODIUM 40 MG PO TBEC: 40.0000 mg | DELAYED_RELEASE_TABLET | Freq: Two times a day (BID) | ORAL | 1 refills | Status: DC

## 2020-12-03 NOTE — Addendum Note (Signed)
Encounter addended by: Kerry Dory, CMA on: 12/03/2020 3:59 PM  Actions taken: Charge Capture section accepted

## 2020-12-03 NOTE — Progress Notes (Signed)
Advanced [facility] Clinic Note   Referring Physician: PCP: [name], NP PCP-Cardiologist: Shelva Majestic, MD  [facility]: Dr. 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 Also w/ h/o poor compliance. Previous referred to paramedicine but not fully cooperative. Recently diagnosed w/ COVID 09/19/20.   Admitted 40/98 w/ a/c systolic heart failure w/ low out-put and AKI requiring milrinone to help w/ diuresis. He was cocaine positive. Diuresed and weaned off milrinone w/ stable co-ox at 65%. Scr had improved from 2.1>>1.7. D/c wt 159 lb. Unfortunately, his wife was also hospitalized for COVID and died while he was admitted.   Readmitted again 10/31/20 with marked volume overload and low output heart failure. He complained of abdominal pain, initially suspected to be 2/2 low output HF but CT showeda mass-like area in the antral pre-pyloric region of the stomach. EGD with complicated distal stomach ulcers. Pathology confirmed -intramucosal adenocarcinoma. Seen by oncology and not felt to be a candidate for surgery given other co morbidities. He has been referred for staging PET scan and being considered for palliative course of chemotherapy and radiation.   He presents to clinic today for f/u. Here with his sister whom he currently resides with. Now lives in [place] and thus no longer qualifies for paramedicine.    He reports doing fairly well symptom wise. Denies dyspnea w/ ADLs. No LEE, orthopnea/PND, LEE or CP. Appetite is ok. No significant abdominal pain. Reports full med compliance. BP well controlled. No orthostatic symptoms. EKG shows NSR. HR 90s. ReDs clip wnl at 26%.  He eeenies further cocaine use. Still smoking cigarettes. He is scheduled to get port on Friday to start chemo.     Review of systems complete and found to be negative unless listed in HPI.      Past Medical History:  Diagnosis Date  . CAD in native artery 07/17/14   STEMI- LAD stenosis with DES  . CHF (congestive heart failure) ([place])   . Cocaine abuse ([place])   . COPD (chronic obstructive pulmonary disease) ([place])   . Dyspnea   . GERD (gastroesophageal reflux disease)   . Hypertension   . ST elevation myocardial infarction (STEMI) involving left anterior descending (LAD) coronary artery with complication ([place])   . Tobacco abuse 07/17/2014    Current Outpatient Medications  Medication Sig Dispense Refill  . omeprazole (PRILOSEC) 20 MG capsule TAKE ONE CAPSULE BY MOUTH ONCE DAILY (MORNING) 30 capsule 1  . albuterol (VENTOLIN HFA) 108 (90 Base) MCG/ACT inhaler Inhale 2 puffs into the lungs every 4 (four) hours as needed for wheezing or shortness of breath. 6.7 g 1  . aspirin 81 MG EC tablet Take 1 tablet (81 mg total) by mouth daily. 30 tablet 11  . atorvastatin (LIPITOR) 80 MG tablet Take 1 tablet (80 mg total) by mouth daily. 30 tablet 11  . digoxin (LANOXIN) 0.125 MG tablet Take 1 tablet (0.125 mg total) by mouth daily. 30 tablet 11  . empagliflozin (JARDIANCE) 10 MG TABS tablet Take 1 tablet (10 mg total) by mouth daily. 30 tablet 11  . isosorbide-hydrALAZINE (BIDIL) 20-37.5 MG tablet Take 1 tablet by mouth 3 (three) times daily. 90 tablet 11  .  ivabradine (CORLANOR) 5 MG TABS tablet Take 1 tablet (5 mg total) by mouth 2 (two) times daily with a meal. 60 tablet 11  . potassium chloride SA (KLOR-CON) 20 MEQ tablet Take 1 tablet (20 mEq total) by mouth daily. 30 tablet 11  . spironolactone (ALDACTONE) 25 MG tablet Take 1 tablet (25 mg total) by mouth daily. 30 tablet 11  . torsemide (DEMADEX) 20 MG tablet Take 4 tablets (80 mg total) by mouth every morning AND 2 tablets (40 mg total)  every evening. 180 tablet 11   No current facility-administered medications for this encounter.    No Known Allergies    Social History   Socioeconomic History  . Marital status: Single    Spouse name: Not on file  . Number of children: 2  . Years of education: 28  . Highest education level: 12th grade  Occupational History  . Occupation: Landscaping  Tobacco Use  . Smoking status: Current Every Day Smoker    Packs/day: 0.25    Years: 30.00    Pack years: 7.50    Types: Cigarettes  . Smokeless tobacco: Never Used  . Tobacco comment: .5 PK PER DAY  Vaping Use  . Vaping Use: Never used  Substance and Sexual Activity  . Alcohol use: No    Comment: Patient denies abuse, states social drinker  . Drug use: Yes    Types: Cocaine, "Crack" cocaine    Comment: heroin  . Sexual activity: Yes  Other Topics Concern  . Not on file  Social History Narrative   Lives in [place] with his sister and girlfriend. He works Aeronautical engineer work.    Social Determinants of Health   Financial Resource Strain:   . Difficulty of Paying Living Expenses: Not on file  Food Insecurity: Food Insecurity Present  . Worried About Charity fundraiser in the Last Year: Sometimes true  . Ran Out of Food in the Last Year: Never true  Transportation Needs: No Transportation Needs  . Lack of Transportation (Medical): No  . Lack of Transportation (Non-Medical): No  Physical Activity:   . Days of Exercise per Week: Not on file  . Minutes of Exercise per Session: Not on file  Stress:   . Feeling of Stress : Not on file  Social Connections:   . Frequency of Communication with Friends and Family: Not on file  . Frequency of Social Gatherings with Friends and Family: Not on file  . Attends Religious Services: Not on file  . Active Member of Clubs or Organizations: Not on file  . Attends Archivist Meetings: Not on file  . Marital Status: Not on file  Intimate Partner Violence:    . Fear of Current or Ex-Partner: Not on file  . Emotionally Abused: Not on file  . Physically Abused: Not on file  . Sexually Abused: Not on file      Family History  Problem Relation Age of Onset  . Cirrhosis Father   . Heart attack Mother   . Heart attack Sister   . Heart failure Sister   . Heart attack Brother     Vitals:   12/03/20 1202  BP: 110/78  Pulse: (!) 190  SpO2: 97%  Weight: 73.8 kg (162 lb 12.8 oz)   Wt Readings from Last 3 Encounters:  12/03/20 73.8 kg (162 lb 12.8 oz)  11/29/20 74.2 kg (163 lb 8 oz)  11/13/20 74.1 kg (163 lb 4.8 oz)   PHYSICAL EXAM:  ReDs Clip 26%  General: thin appearing middle aged AAM. No respiratory difficulty HEENT: normal Neck: supple. no JVD. Carotids 2+ bilat; no bruits. No lymphadenopathy or thyromegaly appreciated. Cor: PMI nondisplaced. Regular rate & rhythm. No rubs, gallops or murmurs. Lungs: clear Abdomen: soft, nontender, nondistended. No hepatosplenomegaly. No bruits or masses. Good bowel sounds. Extremities: no cyanosis, clubbing, rash, edema Neuro: alert & oriented x 3, cranial nerves grossly intact. moves all 4 extremities w/o difficulty. Affect pleasant.   ASSESSMENT & PLAN: 1. Chronic Systolic HF: Echo in 1275 with EF 40-45%, thought to be ischemic cardiomyopathy. Cath in 10/19 showed 90% ostial OM1 and 70% PLV, no interventional target. Suspect mixed ischemic/nonischemic cardiomyopathy. Recent Echo 11/07/23 showed EF 20-25% with mild RV dysfunction.He has a strong family history of CHF/cardiomyopathy, so may be a component of familial cardiomyopathy.Cocaine likely plays a role in his cardiomyopathy.Multiple re-admits forcardiogenic shockin the last 6 months requiring milrinone. Unfortunately, not a candidate for advanced therapies or home milrinone with active use of cocaine and newly diagnosed gastric cancer.  - He has end-stage HF.    - Given social situation and ongoing substance abuse he is not a candidate  for any advanced therapies including home milrinone - will continue medical management, though limited by CKD  - Volume status good today, ReDs Clip 26%, NYHA Class II  -Continue digoxin 0.125 Check Dig level today  - Continue jardiance 10 mg daily  - Continue bidil 1 tab three times a day.  - Continue invabradine 7.5 twice a day  - Continue spiro 25 mg dialy  - No  blocker w/ h/o low output and cocaine use - No ARB/ARNi nor Spiro due to CKD  - Check BMP today    2. CAD: H/o anterior STEMI with DES to LAD in 7/15. Cath in 10/19 with 90% ostial OM1 and 70% PLV, no interventional target.  - he denies any recent CP  -ContinueASA 81 and atorvastatin 80 mg daily.  - needs to refrain from using cocaine  3. Cocaine abuse: - he denies any further use but unsure how truthful (family present at today's visit)  4. Active smoker:  -Discussed cessation encouraged   5. Stage III CKD  - Baseline SCr ~1.6 - Check BMET today   6. Recent COVID Infection  - tested + 09/19/20 - unfortunately, his wife recently died from [place] 2023-11-07    7. Mitral Regurgitation - moderate to severe on recent echo, suspect functional MR from dilated CM - NYHA Class II currently, volume status stable   8. Gastric Cancer  - EGD w/ gastric ulcer. Underwent biopsy w/ path confirming intramucosal adenocarcinoma  - not a canidate for surgery given other co morbidities including severe HF - plan chemotherapy per oncology   F/u in 6 weeks w/ APP   Lyda Jester, PA-C 12/03/20

## 2020-12-03 NOTE — Patient Instructions (Addendum)
  It was great to see you today! No medication changes are needed at this time.    Labs today We will only contact you if something comes back abnormal or we need to make some changes. Otherwise no news is good news!   Your physician recommends that you schedule a follow-up appointment in: 6 weeks   If you have any questions or concerns before your next appointment please send [place] a message through [place] or call our office at [phone].    TO LEAVE A MESSAGE FOR THE NURSE SELECT OPTION 2, PLEASE LEAVE A MESSAGE INCLUDING: . YOUR NAME . DATE OF BIRTH . CALL BACK NUMBER . REASON FOR CALL**this is important as we prioritize the call backs  YOU WILL RECEIVE A CALL BACK THE SAME DAY AS LONG AS YOU CALL BEFORE 4:00 PM

## 2020-12-03 NOTE — Progress Notes (Signed)
ReDS Vest / Clip - 12/03/20 1200      ReDS Vest / Clip   Station Marker D    Ruler Value 36    ReDS Value Range Low volume    ReDS Actual Value 26    Anatomical Comments sitting

## 2020-12-03 NOTE — Addendum Note (Signed)
Encounter addended by: [name], LCSW on: 12/03/2020 2:03 PM  Actions taken: Flowsheet accepted, Specialty comments modified

## 2020-12-04 ENCOUNTER — Other Ambulatory Visit: Payer: Self-pay | Admitting: Radiology

## 2020-12-06 ENCOUNTER — Ambulatory Visit (HOSPITAL_COMMUNITY)
Admission: RE | Admit: 2020-12-06 | Discharge: 2020-12-06 | Disposition: A | Discharge status: Home / Self Care | Payer: Medicaid Other | Source: Ambulatory Visit | Attending: Oncology | Admitting: Oncology

## 2020-12-06 ENCOUNTER — Other Ambulatory Visit: Payer: Self-pay

## 2020-12-06 ENCOUNTER — Inpatient Hospital Stay: Payer: Medicaid Other

## 2020-12-06 DIAGNOSIS — F17210 Nicotine dependence, cigarettes, uncomplicated: Secondary | ICD-10-CM | POA: Diagnosis not present

## 2020-12-06 DIAGNOSIS — K219 Gastro-esophageal reflux disease without esophagitis: Secondary | ICD-10-CM | POA: Insufficient documentation

## 2020-12-06 DIAGNOSIS — I2510 Atherosclerotic heart disease of native coronary artery without angina pectoris: Secondary | ICD-10-CM | POA: Insufficient documentation

## 2020-12-06 DIAGNOSIS — C169 Malignant neoplasm of stomach, unspecified: Secondary | ICD-10-CM | POA: Insufficient documentation

## 2020-12-06 DIAGNOSIS — Z7982 Long term (current) use of aspirin: Secondary | ICD-10-CM | POA: Insufficient documentation

## 2020-12-06 DIAGNOSIS — Z452 Encounter for adjustment and management of vascular access device: Secondary | ICD-10-CM | POA: Diagnosis not present

## 2020-12-06 DIAGNOSIS — J449 Chronic obstructive pulmonary disease, unspecified: Secondary | ICD-10-CM | POA: Diagnosis not present

## 2020-12-06 DIAGNOSIS — Z79899 Other long term (current) drug therapy: Secondary | ICD-10-CM | POA: Diagnosis not present

## 2020-12-06 DIAGNOSIS — Z955 Presence of coronary angioplasty implant and graft: Secondary | ICD-10-CM | POA: Diagnosis not present

## 2020-12-06 DIAGNOSIS — C163 Malignant neoplasm of pyloric antrum: Secondary | ICD-10-CM

## 2020-12-06 DIAGNOSIS — I10 Essential (primary) hypertension: Secondary | ICD-10-CM | POA: Insufficient documentation

## 2020-12-06 HISTORY — PX: IR IMAGING GUIDED PORT INSERTION: IMG5740

## 2020-12-06 MED ORDER — LIDOCAINE HCL 1 % IJ SOLN
INTRAMUSCULAR | Status: AC
  Filled 2020-12-06: qty 20

## 2020-12-06 MED ORDER — FENTANYL CITRATE (PF) 100 MCG/2ML IJ SOLN
INTRAMUSCULAR | Status: AC
  Filled 2020-12-06: qty 2

## 2020-12-06 MED ORDER — LIDOCAINE HCL (PF) 1 % IJ SOLN
INTRAMUSCULAR | Status: AC | PRN
  Administered 2020-12-06: 10 mL

## 2020-12-06 MED ORDER — MIDAZOLAM HCL 2 MG/2ML IJ SOLN
INTRAMUSCULAR | Status: AC
  Filled 2020-12-06: qty 4

## 2020-12-06 MED ORDER — FENTANYL CITRATE (PF) 100 MCG/2ML IJ SOLN
INTRAMUSCULAR | Status: AC | PRN
Start: 2020-12-06 — End: 2020-12-06
  Administered 2020-12-06 (×2): 50 ug via INTRAVENOUS

## 2020-12-06 MED ORDER — MIDAZOLAM HCL 2 MG/2ML IJ SOLN
INTRAMUSCULAR | Status: AC | PRN
  Administered 2020-12-06 (×4): 1 mg via INTRAVENOUS

## 2020-12-06 MED ORDER — CEFAZOLIN SODIUM-DEXTROSE 2-4 GM/100ML-% IV SOLN
INTRAVENOUS | Status: AC
  Administered 2020-12-06: 2000 mg
  Filled 2020-12-06: qty 100

## 2020-12-06 MED ORDER — HEPARIN SOD (PORK) LOCK FLUSH 100 UNIT/ML IV SOLN
INTRAVENOUS | Status: AC
  Filled 2020-12-06: qty 5

## 2020-12-06 NOTE — Discharge Instructions (Signed)

## 2020-12-06 NOTE — Procedures (Signed)
Interventional Radiology Procedure Note  Procedure: Single Lumen Power Port Placement    Access:  Right IJ vein.  Findings: Catheter tip positioned at SVC/RA junction. Port is ready for immediate use.   Complications: None  EBL: < 10 mL  Recommendations:  - Ok to shower in 24 hours - Do not submerge for 7 days - Routine line care   Lilymarie Scroggins T. Jaykob Minichiello, M.D Pager:  [phone]   

## 2020-12-06 NOTE — Consult Note (Signed)
Chief Complaint: Patient was seen in consultation today for port a cath placement  Referring Physician(s): Sherrill,Gary B  Supervising Physician: Aletta Edouard  Patient Status: [facility] - Out-pt  History of Present Illness: Tyler Wong is a 54 y.o. male smoker with history of newly diagnosed gastric cancer who presents today for Port-A-Cath placement for chemotherapy. Additional medical history as below.  Past Medical History:  Diagnosis Date  . CAD in native artery 07/17/14   STEMI- LAD stenosis with DES  . CHF (congestive heart failure) ([place])   . Cocaine abuse ([place])   . COPD (chronic obstructive pulmonary disease) ([place])   . Dyspnea   . GERD (gastroesophageal reflux disease)   . Hypertension   . ST elevation myocardial infarction (STEMI) involving left anterior descending (LAD) coronary artery with complication ([place])   . Tobacco abuse 07/17/2014    Past Surgical History:  Procedure Laterality Date  . BIOPSY  11/05/2020   Procedure: BIOPSY;  Surgeon: Wilford Corner, MD;  Location: [place];  Service: Endoscopy;;  . CORONARY ANGIOPLASTY WITH STENT PLACEMENT  07/18/14   resolute DES to LAD STEMI  . ESOPHAGOGASTRODUODENOSCOPY N/A 11/05/2020   Procedure: ESOPHAGOGASTRODUODENOSCOPY (EGD);  Surgeon: Wilford Corner, MD;  Location: [place];  Service: Endoscopy;  Laterality: N/A;  . LEFT HEART CATH N/A 07/17/2014   Procedure: LEFT HEART CATH;  Surgeon: [name], MD;  Location: [facility] CATH LAB;  Service: Cardiovascular;  Laterality: N/A;  . PERCUTANEOUS CORONARY STENT INTERVENTION (PCI-S)  07/17/2014   Procedure: PERCUTANEOUS CORONARY STENT INTERVENTION (PCI-S);  Surgeon: [name], MD;  Location: [facility] CATH LAB;  Service: Cardiovascular;;  . RIGHT/LEFT HEART CATH AND CORONARY ANGIOGRAPHY N/A 10/03/2018   Procedure: RIGHT/LEFT HEART CATH AND CORONARY ANGIOGRAPHY;  Surgeon: [name], MD;  Location: [place] CV LAB;  Service: Cardiovascular;  Laterality: N/A;     Allergies: Patient has no known allergies.  Medications: Prior to Admission medications   Medication Sig Start Date End Date Taking? Authorizing Provider  aspirin 81 MG EC tablet Take 1 tablet (81 mg total) by mouth daily. 11/26/20  Yes [name], MD  atorvastatin (LIPITOR) 80 MG tablet Take 1 tablet (80 mg total) by mouth daily. 11/26/20  Yes [name], MD  digoxin (LANOXIN) 0.125 MG tablet Take 1 tablet (0.125 mg total) by mouth daily. 11/26/20  Yes [name], MD  empagliflozin (JARDIANCE) 10 MG TABS tablet Take 1 tablet (10 mg total) by mouth daily. 11/26/20  Yes [name], MD  isosorbide-hydrALAZINE (BIDIL) 20-37.5 MG tablet Take 1 tablet by mouth 3 (three) times daily. 11/26/20  Yes [name], MD  ivabradine (CORLANOR) 5 MG TABS tablet Take 1 tablet (5 mg total) by mouth 2 (two) times daily with a meal. 11/26/20  Yes [name], MD  omeprazole (PRILOSEC) 20 MG capsule TAKE ONE CAPSULE BY MOUTH ONCE DAILY (MORNING) 12/03/20  Yes [name], NP  pantoprazole (PROTONIX) 40 MG tablet Take 1 tablet (40 mg total) by mouth 2 (two) times daily. 12/03/20 01/02/21 Yes Simmons, Brittainy M, PA-C  potassium chloride SA (KLOR-CON) 20 MEQ tablet Take 1 tablet (20 mEq total) by mouth daily. 11/26/20  Yes [name], MD  spironolactone (ALDACTONE) 25 MG tablet Take 1 tablet (25 mg total) by mouth daily. 11/26/20  Yes [name], MD  torsemide (DEMADEX) 20 MG tablet Take 4 tablets (80 mg total) by mouth every morning AND 2 tablets (40 mg total) every evening. 11/26/20  Yes [name], MD  albuterol (VENTOLIN HFA) 108 (90 Base) MCG/ACT inhaler Inhale 2 puffs into the lungs every 4 (four) hours as needed for wheezing or shortness of breath. 01/24/20   [name], NP     Family History  Problem Relation Age of Onset  . Cirrhosis Father   . Heart attack Mother   . Heart attack Sister   . Heart failure Sister   . Heart attack  Brother     Social History   Socioeconomic History  . Marital status: Single    Spouse name: Not on file  . Number of children: 2  . Years of education: 9  . Highest education level: 12th grade  Occupational History  . Occupation: Landscaping  Tobacco Use  . Smoking status: Current Every Day Smoker    Packs/day: 0.25    Years: 30.00    Pack years: 7.50    Types: Cigarettes  . Smokeless tobacco: Never Used  . Tobacco comment: .5 PK PER DAY  Vaping Use  . Vaping Use: Never used  Substance and Sexual Activity  . Alcohol use: No    Comment: Patient denies abuse, states social drinker  . Drug use: Yes    Types: Cocaine, "Crack" cocaine    Comment: heroin  . Sexual activity: Yes  Other Topics Concern  . Not on file  Social History Narrative   Lives in [place] with his sister and girlfriend. He works Aeronautical engineer work.    Social Determinants of Health   Financial Resource Strain: Not on file  Food Insecurity: Food Insecurity Present  . Worried About Charity fundraiser in the Last Year: Sometimes true  . Ran Out of Food in the Last Year: Never true  Transportation Needs: No Transportation Needs  . Lack of Transportation (Medical): No  . Lack of Transportation (Non-Medical): No  Physical Activity: Not on file  Stress: Not on file  Social Connections: Not on file      Review of Systems currently denies fever, headache, chest pain, dyspnea, cough, abdominal/back pain, nausea, vomiting or bleeding.  Vital Signs: BP 106/71   Pulse 86   Temp 98.2 F (36.8 C) (Oral)   Resp 18   SpO2 99%   Physical Exam awake, alert.  Chest clear to auscultation bilaterally.  Heart with regular rate and rhythm.  Abdomen soft, positive bowel sounds, nontender.  No lower extremity edema.  Imaging: NM PET Image Initial (PI) Skull Base To Thigh  Result Date: 11/28/2020 CLINICAL DATA:  Initial treatment strategy for gastrointestinal cancer. EXAM: NUCLEAR MEDICINE PET  SKULL BASE TO THIGH TECHNIQUE: 8.2 mCi F-18 FDG was injected intravenously. Full-ring PET imaging was performed from the skull base to thigh after the radiotracer. CT data was obtained and used for attenuation correction and anatomic localization. Fasting blood glucose: 105 mg/dl COMPARISON:  Comparison with abdomen and pelvis CT of November 01, 2020 FINDINGS: Mediastinal blood pool activity: SUV max 2.2 Liver activity: SUV max NA NECK: Symmetric activity in the bilateral parotid glands, nonspecific finding and not associated with underlying visible lesion. Intensely hypermetabolic retro-orbital fat without visible lesion. No signs of adenopathy in the neck. Incidental CT findings: none CHEST: Pre-vascular lymph node (image 75 of series 4 6 mm short axis with a maximum SUV of 3.6. RIGHT paratracheal lymph node with fatty hilum shows maximum SUV of 3.5. No suspicious pulmonary nodule. Incidental CT findings: Marked cardiomegaly. Three-vessel coronary artery disease. No pericardial effusion. Aortic caliber is normal  with scattered atheromatous plaque. ABDOMEN/PELVIS: Focal area of hypermetabolic activity near the gastric antrum. No visible mass on today's study. Mild antral thickening. This area projects into the liver on diffusion images with a maximum SUV of approximately 5.3 measuring approximately 1.5 cm. No signs of additional abnormal uptake though there is some scattered uptake throughout the stomach that is nonspecific no adenopathy in the abdomen or in the pelvis. Incidental CT findings: none SKELETON: No focal hypermetabolic activity to suggest skeletal metastasis. Incidental CT findings: None IMPRESSION: Focal area of hypermetabolic activity about the gastric antrum may reflect site of tumor in this area. This averages into the liver on today's study, difficult to determine exact site of origin. Consider CT of the abdomen is with contrast to exclude the possibility of a small liver lesion in this area  adjacent to the stomach. Nonspecific areas of activity in the stomach more proximally as described, correlate with recent endoscopy results. Small lymph nodes in the chest with mild increased metabolic activity are nonspecific and could potentially be reactive. Suggest close attention on follow-up. Symmetric extraocular muscle activity and parotid activity likely related to physiologic process. No visible parotid lesion. Coronary artery disease, cardiomegaly and mild atherosclerosis. Aortic Atherosclerosis (ICD10-I70.0). Electronically Signed   By: Zetta Bills M.D.   On: 11/28/2020 11:51    Labs:  CBC: Recent Labs    10/31/20 1451 11/02/20 0054 11/03/20 0403 11/04/20 0254  WBC 7.7 8.7 7.8 7.9  HGB 14.3 11.7* 13.4 13.5  HCT 45.6 36.6* 40.6 41.7  PLT 235 214 262 253    COAGS: No results for input(s): INR, APTT in the last 8760 hours.  BMP: Recent Labs    08/27/20 1043 09/19/20 2058 09/19/20 2237 09/20/20 0125 09/21/20 0138 10/03/20 1141 11/05/20 0400 11/06/20 0149 11/12/20 1054 12/03/20 1220  NA 139 134*   < >  --  133*   < > 135 136 138 136  K 3.0* 4.2   < >  --  4.3   < > 4.3 4.3 4.3 3.5  CL 98 94*   < >  --  94*   < > 96* 98 100 98  CO2 32 28  --   --  26   < > 29 27 27 24   GLUCOSE 156* 117*   < >  --  182*   < > 96 120* 110* 116*  BUN 30* 19   < >  --  31*   < > 28* 35* 41* 26*  CALCIUM 8.4* 8.3*  --   --  8.6*   < > 9.0 9.1 9.3 9.2  CREATININE 1.88* 1.58*   < > 1.65* 1.61*   < > 1.60* 2.04* 1.82* 1.50*  GFRNONAA 40* 49*  --  46* 48*   < > 51* 38* 43* 55*  GFRAA 46* 57*  --  54* 55*  --   --   --   --   --    < > = values in this interval not displayed.    LIVER FUNCTION TESTS: Recent Labs    10/31/20 1604 11/02/20 0054 11/03/20 0403 11/04/20 0254  BILITOT 1.5* 1.2 1.1 1.5*  AST 31 29 27 24   ALT 32 30 31 28   ALKPHOS 113 101 111 109  PROT 7.2 6.3* 7.3 7.2  ALBUMIN 3.3* 2.9* 3.3* 3.3*    TUMOR MARKERS: No results for input(s): AFPTM, CEA, CA199,  CHROMGRNA in the last 8760 hours.  Assessment and Plan: Patient with history of coronary  artery disease with prior STEMI/stenting, CHF, substance abuse, COPD, GERD, hypertension and newly diagnosed gastric cancer.  He presents today for Port-A-Cath placement for chemotherapy.Risks and benefits of image guided port-a-catheter placement was discussed with the patient including, but not limited to bleeding, infection, pneumothorax, or fibrin sheath development and need for additional procedures.  All of the patient's questions were answered, patient is agreeable to proceed. Consent signed and in chart.     Thank you for this interesting consult.  I greatly enjoyed meeting Garek Schuneman and look forward to participating in their care.  A copy of this report was sent to the requesting provider on this date.  Electronically Signed: D. Rowe Robert, PA-C 12/06/2020, 10:56 AM   I spent a total of  25 minutes   in face to face in clinical consultation, greater than 50% of which was counseling/coordinating care for port a cath placement

## 2020-12-08 ENCOUNTER — Other Ambulatory Visit: Payer: Self-pay | Admitting: Oncology

## 2020-12-09 ENCOUNTER — Encounter: Payer: Self-pay | Admitting: Oncology

## 2020-12-09 ENCOUNTER — Other Ambulatory Visit: Payer: Self-pay

## 2020-12-09 ENCOUNTER — Other Ambulatory Visit: Payer: Self-pay | Admitting: *Deleted

## 2020-12-09 ENCOUNTER — Inpatient Hospital Stay: Payer: Medicaid Other

## 2020-12-09 MED ORDER — ONDANSETRON HCL 8 MG PO TABS: 8.0000 mg | ORAL_TABLET | Freq: Three times a day (TID) | ORAL | 1 refills | Status: DC | PRN

## 2020-12-09 MED ORDER — PROCHLORPERAZINE MALEATE 10 MG PO TABS: 10.0000 mg | ORAL_TABLET | Freq: Four times a day (QID) | ORAL | 1 refills | Status: DC | PRN

## 2020-12-09 MED ORDER — LIDOCAINE-PRILOCAINE 2.5-2.5 % EX CREA: 1.0000 "application " | TOPICAL_CREAM | CUTANEOUS | 1 refills | Status: DC

## 2020-12-09 NOTE — Progress Notes (Signed)
Per education nurse: Needs EMLA and anti-emetics sent in.

## 2020-12-09 NOTE — Progress Notes (Signed)
Patient was able to have support letter faxed to apply for grant.  Advised him I received the letter and will meet with him on next appointment day to obtain grant signature and go over details. He verbalized understanding.  He has my card for any additional financial questions or concerns.

## 2020-12-09 NOTE — Progress Notes (Signed)
Met with patient at registration to introduce myself as Arboriculturist and to offer available resources.  Discussed one-time $1000 [employer] grant to assist with personal expenses while going through treatment. Advised what is needed to apply.  Gave him my card if interested in applying and for any additional financial questions or concerns.

## 2020-12-12 ENCOUNTER — Inpatient Hospital Stay: Payer: Medicaid Other

## 2020-12-12 ENCOUNTER — Encounter: Payer: Self-pay | Admitting: Nurse Practitioner

## 2020-12-12 ENCOUNTER — Inpatient Hospital Stay (HOSPITAL_BASED_OUTPATIENT_CLINIC_OR_DEPARTMENT_OTHER): Payer: Medicaid Other | Admitting: Nurse Practitioner

## 2020-12-12 ENCOUNTER — Other Ambulatory Visit: Payer: Self-pay

## 2020-12-12 VITALS — BP 99/75 | HR 98 | Temp 98.6°F | Resp 16 | Ht 70.0 in | Wt 160.6 lb

## 2020-12-12 DIAGNOSIS — C163 Malignant neoplasm of pyloric antrum: Secondary | ICD-10-CM | POA: Diagnosis not present

## 2020-12-12 DIAGNOSIS — Z5111 Encounter for antineoplastic chemotherapy: Secondary | ICD-10-CM | POA: Diagnosis not present

## 2020-12-12 LAB — CMP (CANCER CENTER ONLY)
ALT: 21 U/L (ref 0–44)
AST: 21 U/L (ref 15–41)
Albumin: 3.8 g/dL (ref 3.5–5.0)
Alkaline Phosphatase: 106 U/L (ref 38–126)
Anion gap: 11 (ref 5–15)
BUN: 23 mg/dL — ABNORMAL HIGH (ref 6–20)
CO2: 32 mmol/L (ref 22–32)
Calcium: 9.4 mg/dL (ref 8.9–10.3)
Chloride: 100 mmol/L (ref 98–111)
Creatinine: 1.46 mg/dL — ABNORMAL HIGH (ref 0.61–1.24)
GFR, Estimated: 56 mL/min — ABNORMAL LOW (ref >60)
Glucose, Bld: 109 mg/dL — ABNORMAL HIGH (ref 70–99)
Potassium: 3.6 mmol/L (ref 3.5–5.1)
Sodium: 143 mmol/L (ref 135–145)
Total Bilirubin: 1.2 mg/dL (ref 0.3–1.2)
Total Protein: 7.6 g/dL (ref 6.5–8.1)

## 2020-12-12 LAB — CBC WITH DIFFERENTIAL (CANCER CENTER ONLY)
Abs Immature Granulocytes: 0.07 10*3/uL (ref 0.00–0.07)
Basophils Absolute: 0.1 10*3/uL (ref 0.0–0.1)
Basophils Relative: 1 %
Eosinophils Absolute: 0.1 10*3/uL (ref 0.0–0.5)
Eosinophils Relative: 1 %
HCT: 43.3 % (ref 39.0–52.0)
Hemoglobin: 14 g/dL (ref 13.0–17.0)
Immature Granulocytes: 1 %
Lymphocytes Relative: 10 %
Lymphs Abs: 0.7 10*3/uL (ref 0.7–4.0)
MCH: 27.2 pg (ref 26.0–34.0)
MCHC: 32.3 g/dL (ref 30.0–36.0)
MCV: 84.1 fL (ref 80.0–100.0)
Monocytes Absolute: 1 10*3/uL (ref 0.1–1.0)
Monocytes Relative: 13 %
Neutro Abs: 5.6 10*3/uL (ref 1.7–7.7)
Neutrophils Relative %: 74 %
Platelet Count: 217 10*3/uL (ref 150–400)
RBC: 5.15 MIL/uL (ref 4.22–5.81)
RDW: 16.9 % — ABNORMAL HIGH (ref 11.5–15.5)
WBC Count: 7.5 10*3/uL (ref 4.0–10.5)
nRBC: 0 % (ref 0.0–0.2)

## 2020-12-12 LAB — CEA (IN HOUSE-CHCC): CEA (CHCC-In House): 6.7 ng/mL — ABNORMAL HIGH (ref 0.00–5.00)

## 2020-12-12 MED ORDER — SODIUM CHLORIDE 0.9 % IV SOLN
2400.0000 mg/m2 | INTRAVENOUS | Status: DC
  Administered 2020-12-12: 16:00:00 4600 mg via INTRAVENOUS
  Filled 2020-12-12: qty 92

## 2020-12-12 MED ORDER — DEXTROSE 5 % IV SOLN
Freq: Once | INTRAVENOUS | Status: AC
  Filled 2020-12-12: qty 250

## 2020-12-12 MED ORDER — OXALIPLATIN CHEMO INJECTION 100 MG/20ML
85.0000 mg/m2 | Freq: Once | INTRAVENOUS | Status: AC
  Administered 2020-12-12: 13:00:00 160 mg via INTRAVENOUS
  Filled 2020-12-12: qty 32

## 2020-12-12 MED ORDER — LEUCOVORIN CALCIUM INJECTION 350 MG
400.0000 mg/m2 | Freq: Once | INTRAVENOUS | Status: AC
  Administered 2020-12-12: 13:00:00 764 mg via INTRAVENOUS
  Filled 2020-12-12: qty 38.2

## 2020-12-12 MED ORDER — SODIUM CHLORIDE 0.9 % IV SOLN
10.0000 mg | Freq: Once | INTRAVENOUS | Status: AC
  Administered 2020-12-12: 12:00:00 10 mg via INTRAVENOUS
  Filled 2020-12-12: qty 10

## 2020-12-12 MED ORDER — PALONOSETRON HCL INJECTION 0.25 MG/5ML
INTRAVENOUS | Status: AC
  Filled 2020-12-12: qty 5

## 2020-12-12 MED ORDER — FLUOROURACIL CHEMO INJECTION 2.5 GM/50ML
400.0000 mg/m2 | Freq: Once | INTRAVENOUS | Status: AC
Start: 2020-12-12 — End: 2020-12-12
  Administered 2020-12-12: 15:00:00 750 mg via INTRAVENOUS
  Filled 2020-12-12: qty 15

## 2020-12-12 MED ORDER — PALONOSETRON HCL INJECTION 0.25 MG/5ML
0.2500 mg | Freq: Once | INTRAVENOUS | Status: AC
  Administered 2020-12-12: 11:00:00 0.25 mg via INTRAVENOUS

## 2020-12-12 NOTE — Patient Instructions (Signed)
[facility] Discharge Instructions for Patients Receiving Chemotherapy  Today you received the following chemotherapy agents Oxaliplatin (ELOXATIN), Leucovorin & Flourouracil (ADRUCIL).  To help prevent nausea and vomiting after your treatment, we encourage you to take your nausea medication as prescribed.   If you develop nausea and vomiting that is not controlled by your nausea medication, call the clinic.   BELOW ARE SYMPTOMS THAT SHOULD BE REPORTED IMMEDIATELY:  *FEVER GREATER THAN 100.5 F  *CHILLS WITH OR WITHOUT FEVER  NAUSEA AND VOMITING THAT IS NOT CONTROLLED WITH YOUR NAUSEA MEDICATION  *UNUSUAL SHORTNESS OF BREATH  *UNUSUAL BRUISING OR BLEEDING  TENDERNESS IN MOUTH AND THROAT WITH OR WITHOUT PRESENCE OF ULCERS  *URINARY PROBLEMS  *BOWEL PROBLEMS  UNUSUAL RASH Items with * indicate a potential emergency and should be followed up as soon as possible.  Feel free to call the clinic should you have any questions or concerns. The clinic phone number is [phone].  Please show the [place] at check-in to the Emergency Department and triage nurse.  Oxaliplatin Injection What is this medicine? OXALIPLATIN (ox AL i PLA tin) is a chemotherapy drug. It targets fast dividing cells, like cancer cells, and causes these cells to die. This medicine is used to treat cancers of the colon and rectum, and many other cancers. This medicine may be used for other purposes; ask your health care provider or pharmacist if you have questions. COMMON BRAND NAME(S): Eloxatin What should I tell my health care provider before I take this medicine? They need to know if you have any of these conditions:  heart disease  history of irregular heartbeat  liver disease  low blood counts, like white cells, platelets, or red blood cells  lung or breathing disease, like asthma  take medicines that treat or prevent blood clots  tingling of the fingers or toes, or other  nerve disorder  an unusual or allergic reaction to oxaliplatin, other chemotherapy, other medicines, foods, dyes, or preservatives  pregnant or trying to get pregnant  breast-feeding How should I use this medicine? This drug is given as an infusion into a vein. It is administered in a hospital or clinic by a specially trained health care professional. Talk to your pediatrician regarding the use of this medicine in children. Special care may be needed. Overdosage: If you think you have taken too much of this medicine contact a poison control center or emergency room at once. NOTE: This medicine is only for you. Do not share this medicine with others. What if I miss a dose? It is important not to miss a dose. Call your doctor or health care professional if you are unable to keep an appointment. What may interact with this medicine? Do not take this medicine with any of the following medications:  cisapride  dronedarone  pimozide  thioridazine This medicine may also interact with the following medications:  aspirin and aspirin-like medicines  certain medicines that treat or prevent blood clots like warfarin, apixaban, dabigatran, and rivaroxaban  cisplatin  cyclosporine  diuretics  medicines for infection like acyclovir, adefovir, amphotericin B, bacitracin, cidofovir, foscarnet, ganciclovir, gentamicin, pentamidine, vancomycin  NSAIDs, medicines for pain and inflammation, like ibuprofen or naproxen  other medicines that prolong the QT interval (an abnormal heart rhythm)  pamidronate  zoledronic acid This list may not describe all possible interactions. Give your health care provider a list of all the medicines, herbs, non-prescription drugs, or dietary supplements you use. Also tell them if you  smoke, drink alcohol, or use illegal drugs. Some items may interact with your medicine. What should I watch for while using this medicine? Your condition will be monitored  carefully while you are receiving this medicine. You may need blood work done while you are taking this medicine. This medicine may make you feel generally unwell. This is not uncommon as chemotherapy can affect healthy cells as well as cancer cells. Report any side effects. Continue your course of treatment even though you feel ill unless your healthcare professional tells you to stop. This medicine can make you more sensitive to cold. Do not drink cold drinks or use ice. Cover exposed skin before coming in contact with cold temperatures or cold objects. When out in cold weather wear warm clothing and cover your mouth and nose to warm the air that goes into your lungs. Tell your doctor if you get sensitive to the cold. Do not become pregnant while taking this medicine or for 9 months after stopping it. Women should inform their health care professional if they wish to become pregnant or think they might be pregnant. Men should not father a child while taking this medicine and for 6 months after stopping it. There is potential for serious side effects to an unborn child. Talk to your health care professional for more information. Do not breast-feed a child while taking this medicine or for 3 months after stopping it. This medicine has caused ovarian failure in some women. This medicine may make it more difficult to get pregnant. Talk to your health care professional if you are concerned about your fertility. This medicine has caused decreased sperm counts in some men. This may make it more difficult to father a child. Talk to your health care professional if you are concerned about your fertility. This medicine may increase your risk of getting an infection. Call your health care professional for advice if you get a fever, chills, or sore throat, or other symptoms of a cold or flu. Do not treat yourself. Try to avoid being around people who are sick. Avoid taking medicines that contain aspirin,  acetaminophen, ibuprofen, naproxen, or ketoprofen unless instructed by your health care professional. These medicines may hide a fever. Be careful brushing or flossing your teeth or using a toothpick because you may get an infection or bleed more easily. If you have any dental work done, tell your dentist you are receiving this medicine. What side effects may I notice from receiving this medicine? Side effects that you should report to your doctor or health care professional as soon as possible:  allergic reactions like skin rash, itching or hives, swelling of the face, lips, or tongue  breathing problems  cough  low blood counts - this medicine may decrease the number of white blood cells, red blood cells, and platelets. You may be at increased risk for infections and bleeding  nausea, vomiting  pain, redness, or irritation at site where injected  pain, tingling, numbness in the hands or feet  signs and symptoms of bleeding such as bloody or black, tarry stools; red or dark brown urine; spitting up blood or brown material that looks like coffee grounds; red spots on the skin; unusual bruising or bleeding from the eyes, gums, or nose  signs and symptoms of a dangerous change in heartbeat or heart rhythm like chest pain; dizziness; fast, irregular heartbeat; palpitations; feeling faint or lightheaded; falls  signs and symptoms of infection like fever; chills; cough; sore throat; pain or trouble  passing urine  signs and symptoms of liver injury like dark yellow or brown urine; general ill feeling or flu-like symptoms; light-colored stools; loss of appetite; nausea; right upper belly pain; unusually weak or tired; yellowing of the eyes or skin  signs and symptoms of low red blood cells or anemia such as unusually weak or tired; feeling faint or lightheaded; falls  signs and symptoms of muscle injury like dark urine; trouble passing urine or change in the amount of urine; unusually weak or  tired; muscle pain; back pain Side effects that usually do not require medical attention (report to your doctor or health care professional if they continue or are bothersome):  changes in taste  diarrhea  gas  hair loss  loss of appetite  mouth sores This list may not describe all possible side effects. Call your doctor for medical advice about side effects. You may report side effects to FDA at 1-800-FDA-1088. Where should I keep my medicine? This drug is given in a hospital or clinic and will not be stored at home. NOTE: This sheet is a summary. It may not cover all possible information. If you have questions about this medicine, talk to your doctor, pharmacist, or health care provider.  2020 Elsevier/Gold Standard (2019-05-03 12:20:35)  Leucovorin injection What is this medicine? LEUCOVORIN (loo koe VOR in) is used to prevent or treat the harmful effects of some medicines. This medicine is used to treat anemia caused by a low amount of folic acid in the body. It is also used with 5-fluorouracil (5-FU) to treat colon cancer. This medicine may be used for other purposes; ask your health care provider or pharmacist if you have questions. What should I tell my health care provider before I take this medicine? They need to know if you have any of these conditions:  anemia from low levels of vitamin B-12 in the blood  an unusual or allergic reaction to leucovorin, folic acid, other medicines, foods, dyes, or preservatives  pregnant or trying to get pregnant  breast-feeding How should I use this medicine? This medicine is for injection into a muscle or into a vein. It is given by a health care professional in a hospital or clinic setting. Talk to your pediatrician regarding the use of this medicine in children. Special care may be needed. Overdosage: If you think you have taken too much of this medicine contact a poison control center or emergency room at once. NOTE: This medicine  is only for you. Do not share this medicine with others. What if I miss a dose? This does not apply. What may interact with this medicine?  capecitabine  fluorouracil  phenobarbital  phenytoin  primidone  trimethoprim-sulfamethoxazole This list may not describe all possible interactions. Give your health care provider a list of all the medicines, herbs, non-prescription drugs, or dietary supplements you use. Also tell them if you smoke, drink alcohol, or use illegal drugs. Some items may interact with your medicine. What should I watch for while using this medicine? Your condition will be monitored carefully while you are receiving this medicine. This medicine may increase the side effects of 5-fluorouracil, 5-FU. Tell your doctor or health care professional if you have diarrhea or mouth sores that do not get better or that get worse. What side effects may I notice from receiving this medicine? Side effects that you should report to your doctor or health care professional as soon as possible:  allergic reactions like skin rash, itching or hives, swelling  of the face, lips, or tongue  breathing problems  fever, infection  mouth sores  unusual bleeding or bruising  unusually weak or tired Side effects that usually do not require medical attention (report to your doctor or health care professional if they continue or are bothersome):  constipation or diarrhea  loss of appetite  nausea, vomiting This list may not describe all possible side effects. Call your doctor for medical advice about side effects. You may report side effects to FDA at 1-800-FDA-1088. Where should I keep my medicine? This drug is given in a hospital or clinic and will not be stored at home. NOTE: This sheet is a summary. It may not cover all possible information. If you have questions about this medicine, talk to your doctor, pharmacist, or health care provider.  2020 Elsevier/Gold Standard (2008-06-19  16:50:29)  Fluorouracil, 5-FU injection What is this medicine? FLUOROURACIL, 5-FU (flure oh YOOR a sil) is a chemotherapy drug. It slows the growth of cancer cells. This medicine is used to treat many types of cancer like breast cancer, colon or rectal cancer, pancreatic cancer, and stomach cancer. This medicine may be used for other purposes; ask your health care provider or pharmacist if you have questions. COMMON BRAND NAME(S): Adrucil What should I tell my health care provider before I take this medicine? They need to know if you have any of these conditions:  blood disorders  dihydropyrimidine dehydrogenase (DPD) deficiency  infection (especially a virus infection such as chickenpox, cold sores, or herpes)  kidney disease  liver disease  malnourished, poor nutrition  recent or ongoing radiation therapy  an unusual or allergic reaction to fluorouracil, other chemotherapy, other medicines, foods, dyes, or preservatives  pregnant or trying to get pregnant  breast-feeding How should I use this medicine? This drug is given as an infusion or injection into a vein. It is administered in a hospital or clinic by a specially trained health care professional. Talk to your pediatrician regarding the use of this medicine in children. Special care may be needed. Overdosage: If you think you have taken too much of this medicine contact a poison control center or emergency room at once. NOTE: This medicine is only for you. Do not share this medicine with others. What if I miss a dose? It is important not to miss your dose. Call your doctor or health care professional if you are unable to keep an appointment. What may interact with this medicine?  allopurinol  cimetidine  dapsone  digoxin  hydroxyurea  leucovorin  levamisole  medicines for seizures like ethotoin, fosphenytoin, phenytoin  medicines to increase blood counts like filgrastim, pegfilgrastim,  sargramostim  medicines that treat or prevent blood clots like warfarin, enoxaparin, and dalteparin  methotrexate  metronidazole  pyrimethamine  some other chemotherapy drugs like busulfan, cisplatin, estramustine, vinblastine  trimethoprim  trimetrexate  vaccines Talk to your doctor or health care professional before taking any of these medicines:  acetaminophen  aspirin  ibuprofen  ketoprofen  naproxen This list may not describe all possible interactions. Give your health care provider a list of all the medicines, herbs, non-prescription drugs, or dietary supplements you use. Also tell them if you smoke, drink alcohol, or use illegal drugs. Some items may interact with your medicine. What should I watch for while using this medicine? Visit your doctor for checks on your progress. This drug may make you feel generally unwell. This is not uncommon, as chemotherapy can affect healthy cells as well as cancer cells.  Report any side effects. Continue your course of treatment even though you feel ill unless your doctor tells you to stop. In some cases, you may be given additional medicines to help with side effects. Follow all directions for their use. Call your doctor or health care professional for advice if you get a fever, chills or sore throat, or other symptoms of a cold or flu. Do not treat yourself. This drug decreases your body's ability to fight infections. Try to avoid being around people who are sick. This medicine may increase your risk to bruise or bleed. Call your doctor or health care professional if you notice any unusual bleeding. Be careful brushing and flossing your teeth or using a toothpick because you may get an infection or bleed more easily. If you have any dental work done, tell your dentist you are receiving this medicine. Avoid taking products that contain aspirin, acetaminophen, ibuprofen, naproxen, or ketoprofen unless instructed by your doctor. These  medicines may hide a fever. Do not become pregnant while taking this medicine. Women should inform their doctor if they wish to become pregnant or think they might be pregnant. There is a potential for serious side effects to an unborn child. Talk to your health care professional or pharmacist for more information. Do not breast-feed an infant while taking this medicine. Men should inform their doctor if they wish to father a child. This medicine may lower sperm counts. Do not treat diarrhea with over the counter products. Contact your doctor if you have diarrhea that lasts more than 2 days or if it is severe and watery. This medicine can make you more sensitive to the sun. Keep out of the sun. If you cannot avoid being in the sun, wear protective clothing and use sunscreen. Do not use sun lamps or tanning beds/booths. What side effects may I notice from receiving this medicine? Side effects that you should report to your doctor or health care professional as soon as possible:  allergic reactions like skin rash, itching or hives, swelling of the face, lips, or tongue  low blood counts - this medicine may decrease the number of white blood cells, red blood cells and platelets. You may be at increased risk for infections and bleeding.  signs of infection - fever or chills, cough, sore throat, pain or difficulty passing urine  signs of decreased platelets or bleeding - bruising, pinpoint red spots on the skin, black, tarry stools, blood in the urine  signs of decreased red blood cells - unusually weak or tired, fainting spells, lightheadedness  breathing problems  changes in vision  chest pain  mouth sores  nausea and vomiting  pain, swelling, redness at site where injected  pain, tingling, numbness in the hands or feet  redness, swelling, or sores on hands or feet  stomach pain  unusual bleeding Side effects that usually do not require medical attention (report to your doctor or  health care professional if they continue or are bothersome):  changes in finger or toe nails  diarrhea  dry or itchy skin  hair loss  headache  loss of appetite  sensitivity of eyes to the light  stomach upset  unusually teary eyes This list may not describe all possible side effects. Call your doctor for medical advice about side effects. You may report side effects to FDA at 1-800-FDA-1088. Where should I keep my medicine? This drug is given in a hospital or clinic and will not be stored at home. NOTE: This sheet  is a summary. It may not cover all possible information. If you have questions about this medicine, talk to your doctor, pharmacist, or health care provider.  2020 Elsevier/Gold Standard (2008-04-18 13:53:16)

## 2020-12-12 NOTE — Progress Notes (Signed)
Met with patient at registration to obtain signature for [employer].  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 Discussed in detail expenses and how they are covered. Patient received a gas card today from grant. He has a copy of the approval letter and expense sheet along with the Outpatient pharmacy information.  He has my card for any additional financial questions or concerns.

## 2020-12-12 NOTE — Progress Notes (Addendum)
  [facility] OFFICE PROGRESS NOTE   Diagnosis: Gastric cancer  INTERVAL HISTORY:   Tyler Wong returns as scheduled.  Overall he is feeling well.  He denies dysphagia.  No nausea or vomiting.  No recent abdominal pain.  Bowels are moving.  Recent "scratchy throat", occasional cough.  No fever or shortness of breath.  Objective:  Vital signs in last 24 hours:  Blood pressure 99/75, pulse 98, temperature 98.6 F (37 C), temperature source Tympanic, resp. rate 16, height _0  (1.778 m), weight 160 lb 9.6 oz (72.8 kg), SpO2 99 %.    HEENT: No thrush or ulcers. Resp: Lungs clear bilaterally. Cardio: Regular rate and rhythm. GI: Abdomen soft and nontender. Vascular: No leg edema.  Port-A-Cath without erythema  Lab Results:  Lab Results  Component Value Date   WBC 7.5 12/12/2020   HGB 14.0 12/12/2020   HCT 43.3 12/12/2020   MCV 84.1 12/12/2020   PLT 217 12/12/2020   NEUTROABS 5.6 12/12/2020    Imaging:  No results found.  Medications: I have reviewed the patient's current medications.  Assessment/Plan: 1. Gastric cancer  CT abdomen/pelvis 11/01/2020-masslike area measuring 6.3 x 5.6 x 5.6 cm in the antral prepyloric region of the stomach  Upper endoscopy 11/05/2020-patchy white plaques in the distal esophagus; 3 nonbleeding cratered gastric ulcers with pigmented material in the gastric antrum largest 10 mm; segmental moderate inflammation characterized by congestion, erosions and erythema in the gastric antrum.  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 CHF, LVEF 20-25% 10/04/2020 3. CAD status post prior LAD stent 4. COPD 5. Chronic kidney disease 6. Cocaine usage 7. Port-A-Cath placement 12/06/2020, Interventional Radiology   Disposition: Tyler Wong appears stable.  He is scheduled to begin treatment with cycle 1 FOLFOX today.  We again reviewed potential toxicities.  He agrees to proceed.  CBC and chemistry panel from today reviewed.  Labs adequate to proceed as above.  He will return for lab, follow-up, cycle 2 FOLFOX in 2 weeks.  He will contact the office in the interim with any problems.  Patient seen with Dr. Benay Spice.    Ned Card ANP/GNP-BC   12/12/2020  10:22 AM This was a shared visit with Ned Card.  Tyler Wong will begin FOLFOX chemotherapy today.  We reviewed toxicities associated with the FOLFOX regimen.  Julieanne Manson, MD

## 2020-12-13 ENCOUNTER — Telehealth: Payer: Self-pay | Admitting: *Deleted

## 2020-12-13 ENCOUNTER — Telehealth: Payer: Self-pay | Admitting: Nurse Practitioner

## 2020-12-13 NOTE — Telephone Encounter (Signed)
Unable to reach patient to assess how is feeling s/p 1st cycle FOLFOX yesterday. Requested he communicate any issues with nurse Saturday when he has pump removed.

## 2020-12-13 NOTE — Telephone Encounter (Signed)
Scheduled appointments per 12/16 los. Spoke to patient who is aware of appointments date and times.  

## 2020-12-14 ENCOUNTER — Other Ambulatory Visit: Payer: Self-pay

## 2020-12-14 ENCOUNTER — Inpatient Hospital Stay: Payer: Medicaid Other

## 2020-12-14 VITALS — BP 100/77 | HR 110 | Temp 97.6°F | Resp 20

## 2020-12-14 DIAGNOSIS — Z5111 Encounter for antineoplastic chemotherapy: Secondary | ICD-10-CM | POA: Diagnosis not present

## 2020-12-14 DIAGNOSIS — C163 Malignant neoplasm of pyloric antrum: Secondary | ICD-10-CM

## 2020-12-14 MED ORDER — HEPARIN SOD (PORK) LOCK FLUSH 100 UNIT/ML IV SOLN
500.0000 [IU] | Freq: Once | INTRAVENOUS | Status: AC | PRN
  Administered 2020-12-14: 13:00:00 500 [IU]
  Filled 2020-12-14: qty 5

## 2020-12-14 MED ORDER — SODIUM CHLORIDE 0.9% FLUSH
10.0000 mL | INTRAVENOUS | Status: DC | PRN
  Administered 2020-12-14: 13:00:00 10 mL
  Filled 2020-12-14: qty 10

## 2020-12-16 ENCOUNTER — Telehealth (HOSPITAL_COMMUNITY): Payer: Self-pay | Admitting: Licensed Clinical Social Worker

## 2020-12-16 NOTE — Telephone Encounter (Signed)
CSW received call from pt requesting help getting letter from oncology MD stating he might need bedrest following treatments.  Pt staying at shelter and is not allowed to stay in the dorm area during the day without medical letter.  CSW sent message to Oncology CSWs to request assistance  Will continue to follow and assist as needed  [name], [facility] Desk#: [phone] Cell#: [phone]

## 2020-12-19 ENCOUNTER — Telehealth: Payer: Self-pay | Admitting: *Deleted

## 2020-12-19 ENCOUNTER — Encounter: Payer: Self-pay | Admitting: Oncology

## 2020-12-19 NOTE — Telephone Encounter (Signed)
MD completed letter for shelter to stay inside and intermittent bedrest and was faxed to [phone] to [facility]. Patient was notified.

## 2020-12-22 ENCOUNTER — Other Ambulatory Visit: Payer: Self-pay | Admitting: Oncology

## 2020-12-26 ENCOUNTER — Inpatient Hospital Stay: Payer: Medicaid Other

## 2020-12-26 ENCOUNTER — Other Ambulatory Visit: Payer: Self-pay

## 2020-12-26 ENCOUNTER — Encounter: Payer: Self-pay | Admitting: Nurse Practitioner

## 2020-12-26 ENCOUNTER — Inpatient Hospital Stay (HOSPITAL_BASED_OUTPATIENT_CLINIC_OR_DEPARTMENT_OTHER): Payer: Medicaid Other | Admitting: Nurse Practitioner

## 2020-12-26 VITALS — BP 106/83 | HR 95 | Temp 97.8°F | Resp 18 | Ht 70.0 in | Wt 163.2 lb

## 2020-12-26 DIAGNOSIS — C163 Malignant neoplasm of pyloric antrum: Secondary | ICD-10-CM

## 2020-12-26 DIAGNOSIS — Z5111 Encounter for antineoplastic chemotherapy: Secondary | ICD-10-CM | POA: Diagnosis not present

## 2020-12-26 DIAGNOSIS — Z95828 Presence of other vascular implants and grafts: Secondary | ICD-10-CM

## 2020-12-26 DIAGNOSIS — C169 Malignant neoplasm of stomach, unspecified: Secondary | ICD-10-CM | POA: Diagnosis not present

## 2020-12-26 LAB — CBC WITH DIFFERENTIAL (CANCER CENTER ONLY)
Abs Immature Granulocytes: 0.01 10*3/uL (ref 0.00–0.07)
Basophils Absolute: 0 10*3/uL (ref 0.0–0.1)
Basophils Relative: 1 %
Eosinophils Absolute: 0.1 10*3/uL (ref 0.0–0.5)
Eosinophils Relative: 3 %
HCT: 41.4 % (ref 39.0–52.0)
Hemoglobin: 13.5 g/dL (ref 13.0–17.0)
Immature Granulocytes: 0 %
Lymphocytes Relative: 26 %
Lymphs Abs: 1.2 10*3/uL (ref 0.7–4.0)
MCH: 27.4 pg (ref 26.0–34.0)
MCHC: 32.6 g/dL (ref 30.0–36.0)
MCV: 84.1 fL (ref 80.0–100.0)
Monocytes Absolute: 1.1 10*3/uL — ABNORMAL HIGH (ref 0.1–1.0)
Monocytes Relative: 23 %
Neutro Abs: 2.3 10*3/uL (ref 1.7–7.7)
Neutrophils Relative %: 47 %
Platelet Count: 202 10*3/uL (ref 150–400)
RBC: 4.92 MIL/uL (ref 4.22–5.81)
RDW: 16.8 % — ABNORMAL HIGH (ref 11.5–15.5)
WBC Count: 4.8 10*3/uL (ref 4.0–10.5)
nRBC: 0 % (ref 0.0–0.2)

## 2020-12-26 LAB — CMP (CANCER CENTER ONLY)
ALT: 24 U/L (ref 0–44)
AST: 22 U/L (ref 15–41)
Albumin: 3.8 g/dL (ref 3.5–5.0)
Alkaline Phosphatase: 100 U/L (ref 38–126)
Anion gap: 5 (ref 5–15)
BUN: 19 mg/dL (ref 6–20)
CO2: 33 mmol/L — ABNORMAL HIGH (ref 22–32)
Calcium: 9.3 mg/dL (ref 8.9–10.3)
Chloride: 100 mmol/L (ref 98–111)
Creatinine: 1.5 mg/dL — ABNORMAL HIGH (ref 0.61–1.24)
GFR, Estimated: 55 mL/min — ABNORMAL LOW (ref >60)
Glucose, Bld: 79 mg/dL (ref 70–99)
Potassium: 3.8 mmol/L (ref 3.5–5.1)
Sodium: 138 mmol/L (ref 135–145)
Total Bilirubin: 1 mg/dL (ref 0.3–1.2)
Total Protein: 7.3 g/dL (ref 6.5–8.1)

## 2020-12-26 MED ORDER — PALONOSETRON HCL INJECTION 0.25 MG/5ML
INTRAVENOUS | Status: AC
  Filled 2020-12-26: qty 5

## 2020-12-26 MED ORDER — SODIUM CHLORIDE 0.9% FLUSH
10.0000 mL | INTRAVENOUS | Status: DC | PRN
  Administered 2020-12-26: 11:00:00 10 mL via INTRAVENOUS
  Filled 2020-12-26: qty 10

## 2020-12-26 MED ORDER — OXALIPLATIN CHEMO INJECTION 100 MG/20ML
85.0000 mg/m2 | Freq: Once | INTRAVENOUS | Status: AC
  Administered 2020-12-26: 160 mg via INTRAVENOUS
  Filled 2020-12-26: qty 32

## 2020-12-26 MED ORDER — LEUCOVORIN CALCIUM INJECTION 350 MG
400.0000 mg/m2 | Freq: Once | INTRAMUSCULAR | Status: AC
  Administered 2020-12-26: 15:00:00 764 mg via INTRAVENOUS
  Filled 2020-12-26: qty 38.2

## 2020-12-26 MED ORDER — DEXTROSE 5 % IV SOLN
Freq: Once | INTRAVENOUS | Status: AC
  Filled 2020-12-26: qty 250

## 2020-12-26 MED ORDER — PALONOSETRON HCL INJECTION 0.25 MG/5ML
0.2500 mg | Freq: Once | INTRAVENOUS | Status: AC
  Administered 2020-12-26: 14:00:00 0.25 mg via INTRAVENOUS

## 2020-12-26 MED ORDER — SODIUM CHLORIDE 0.9 % IV SOLN
2400.0000 mg/m2 | INTRAVENOUS | Status: DC
  Administered 2020-12-26: 17:00:00 4600 mg via INTRAVENOUS
  Filled 2020-12-26: qty 92

## 2020-12-26 MED ORDER — SODIUM CHLORIDE 0.9 % IV SOLN
10.0000 mg | Freq: Once | INTRAVENOUS | Status: AC
  Administered 2020-12-26: 14:00:00 10 mg via INTRAVENOUS
  Filled 2020-12-26: qty 10

## 2020-12-26 MED ORDER — FLUOROURACIL CHEMO INJECTION 2.5 GM/50ML
400.0000 mg/m2 | Freq: Once | INTRAVENOUS | Status: AC
  Administered 2020-12-26: 17:00:00 750 mg via INTRAVENOUS
  Filled 2020-12-26: qty 15

## 2020-12-26 NOTE — Patient Instructions (Signed)
[facility] Discharge Instructions for Patients Receiving Chemotherapy  Today you received the following chemotherapy agents: oxaliplatin and fluorouracil.  To help prevent nausea and vomiting after your treatment, we encourage you to take your nausea medication as directed.   If you develop nausea and vomiting that is not controlled by your nausea medication, call the clinic.   BELOW ARE SYMPTOMS THAT SHOULD BE REPORTED IMMEDIATELY:  *FEVER GREATER THAN 100.5 F  *CHILLS WITH OR WITHOUT FEVER  NAUSEA AND VOMITING THAT IS NOT CONTROLLED WITH YOUR NAUSEA MEDICATION  *UNUSUAL SHORTNESS OF BREATH  *UNUSUAL BRUISING OR BLEEDING  TENDERNESS IN MOUTH AND THROAT WITH OR WITHOUT PRESENCE OF ULCERS  *URINARY PROBLEMS  *BOWEL PROBLEMS  UNUSUAL RASH Items with * indicate a potential emergency and should be followed up as soon as possible.  Feel free to call the clinic should you have any questions or concerns. The clinic phone number is [phone].  Please show the CHEMO ALERT CARD at check-in to the Emergency Department and triage nurse.

## 2020-12-26 NOTE — Progress Notes (Signed)
Mid-treatment, patient began sneezing, coughing, and sniffling. Patient states he experiences this occasionally. Encouraged patient to seek evaluation and medical attention if these symptoms persist. Verbalized understanding.

## 2020-12-26 NOTE — Progress Notes (Signed)
  [place] OFFICE PROGRESS NOTE   Diagnosis: Gastric cancer  INTERVAL HISTORY:   Mr. Marchetta returns as scheduled.  He completed 1 FOLFOX 12/12/2020.  He had no significant nausea/vomiting.  Mild tenderness for a few days.  He was able to eat and drink without difficulty.  No diarrhea.  Cold sensitivity lasted about 1 week.  No numbness or tingling in the absence of cold exposure.  He has intermittent upper abdominal discomfort.  He attributes the discomfort to an "ulcer".  He is taking Protonix twice a day.  Objective:  Vital signs in last 24 hours:  Blood pressure 106/83, pulse 95, temperature 97.8 F (36.6 C), temperature source Tympanic, resp. rate 18, height _0  (1.778 m), weight 163 lb 3.2 oz (74 kg), SpO2 98 %.    HEENT: Mild white coating over tongue.  No buccal thrush.  No ulcers. Resp: Lungs clear bilaterally. Cardio: Regular rate and rhythm. GI: Abdomen soft and nontender.  No hepatomegaly. Vascular: No leg edema. Skin: Palms without erythema. Port-A-Cath without erythema.   Lab Results:  Lab Results  Component Value Date   WBC 4.8 12/26/2020   HGB 13.5 12/26/2020   HCT 41.4 12/26/2020   MCV 84.1 12/26/2020   PLT 202 12/26/2020   NEUTROABS 2.3 12/26/2020    Imaging:  No results found.  Medications: I have reviewed the patient's current medications.  Assessment/Plan: 1. Gastric cancer  CT abdomen/pelvis 11/01/2020-masslike area measuring 6.3 x 5.6 x 5.6 cm in the antral prepyloric region of the stomach  Upper endoscopy 11/05/2020-patchy white plaques in the distal esophagus; 3 nonbleeding cratered gastric ulcers with pigmented material in the gastric antrum largest 10 mm; segmental moderate inflammation characterized by congestion, erosions and erythema in the gastric antrum. 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 CHF, LVEF 20-25% 10/04/2020 3. CAD status post prior LAD stent 4. COPD 5. Chronic kidney disease 6. Cocaine usage 7. Port-A-Cath placement 12/06/2020, Interventional Radiology   Disposition: Mr. Shirah appears stable.  He has completed 1 cycle of FOLFOX.  Overall he tolerated well.  Plan to proceed with cycle 2 today as scheduled.  We reviewed the CBC from today.  Counts adequate to proceed with treatment.  For the upper abdominal discomfort he will continue Protonix as currently taking and try Mylanta or Maalox as needed.  He will return for lab, follow-up, cycle 3 FOLFOX in 2 weeks.  He will contact the office in the interim with any problems.    Ned Card ANP/GNP-BC   12/26/2020  11:36 AM

## 2020-12-26 NOTE — Progress Notes (Signed)
Nutrition  Complimentary case of ensure plus given to patient today.   Karlee Staff B. Ronalda Walpole, RD, LDN Registered Dietitian [phone] (mobile)   

## 2020-12-28 ENCOUNTER — Other Ambulatory Visit: Payer: Self-pay

## 2020-12-28 ENCOUNTER — Inpatient Hospital Stay: Payer: Medicaid Other | Attending: Oncology

## 2020-12-28 VITALS — BP 148/124 | HR 94 | Temp 97.1°F | Resp 18

## 2020-12-28 DIAGNOSIS — I959 Hypotension, unspecified: Secondary | ICD-10-CM | POA: Insufficient documentation

## 2020-12-28 DIAGNOSIS — J449 Chronic obstructive pulmonary disease, unspecified: Secondary | ICD-10-CM | POA: Insufficient documentation

## 2020-12-28 DIAGNOSIS — N189 Chronic kidney disease, unspecified: Secondary | ICD-10-CM | POA: Insufficient documentation

## 2020-12-28 DIAGNOSIS — I509 Heart failure, unspecified: Secondary | ICD-10-CM | POA: Diagnosis not present

## 2020-12-28 DIAGNOSIS — C163 Malignant neoplasm of pyloric antrum: Secondary | ICD-10-CM | POA: Diagnosis not present

## 2020-12-28 DIAGNOSIS — Z452 Encounter for adjustment and management of vascular access device: Secondary | ICD-10-CM | POA: Diagnosis not present

## 2020-12-28 DIAGNOSIS — Z5111 Encounter for antineoplastic chemotherapy: Secondary | ICD-10-CM | POA: Insufficient documentation

## 2020-12-28 MED ORDER — SODIUM CHLORIDE 0.9% FLUSH
10.0000 mL | INTRAVENOUS | Status: DC | PRN
  Administered 2020-12-28: 10 mL
  Filled 2020-12-28: qty 10

## 2020-12-28 MED ORDER — HEPARIN SOD (PORK) LOCK FLUSH 100 UNIT/ML IV SOLN
500.0000 [IU] | Freq: Once | INTRAVENOUS | Status: AC | PRN
  Administered 2020-12-28: 500 [IU]
  Filled 2020-12-28: qty 5

## 2020-12-28 NOTE — Patient Instructions (Signed)

## 2020-12-30 ENCOUNTER — Telehealth: Payer: Self-pay | Admitting: Nurse Practitioner

## 2020-12-30 NOTE — Telephone Encounter (Signed)
Scheduled appointments per 12/30 los. Called patient, no answer and no voicemail set up. Will have updated calendar printed for patient at next visit.

## 2020-12-31 ENCOUNTER — Telehealth (HOSPITAL_COMMUNITY): Payer: Self-pay | Admitting: Licensed Clinical Social Worker

## 2020-12-31 NOTE — Telephone Encounter (Signed)
CSW called pt to check in.  Pt reports he is doing well and hasn't been feeling too bad from the chemo which he is grateful for.  Continues to be at [facility] and is doing well there reports he has been able to remain clean.  Pt with no needs at this time but will call CSW with any future concerns.  Will continue to follow and assist as needed  Burna Sis, LCSW Clinical Social Worker [facility] Desk#: [phone] Cell#: [phone]

## 2021-01-05 ENCOUNTER — Other Ambulatory Visit: Payer: Self-pay | Admitting: Oncology

## 2021-01-06 ENCOUNTER — Other Ambulatory Visit: Payer: Self-pay | Admitting: Oncology

## 2021-01-08 ENCOUNTER — Other Ambulatory Visit: Payer: Self-pay

## 2021-01-08 ENCOUNTER — Inpatient Hospital Stay: Payer: Medicaid Other

## 2021-01-08 ENCOUNTER — Inpatient Hospital Stay (HOSPITAL_BASED_OUTPATIENT_CLINIC_OR_DEPARTMENT_OTHER): Payer: Medicaid Other | Admitting: Oncology

## 2021-01-08 VITALS — BP 97/68 | HR 43 | Temp 97.3°F | Resp 15 | Ht 70.0 in | Wt 167.3 lb

## 2021-01-08 VITALS — HR 87

## 2021-01-08 DIAGNOSIS — J449 Chronic obstructive pulmonary disease, unspecified: Secondary | ICD-10-CM | POA: Diagnosis not present

## 2021-01-08 DIAGNOSIS — C163 Malignant neoplasm of pyloric antrum: Secondary | ICD-10-CM

## 2021-01-08 DIAGNOSIS — I959 Hypotension, unspecified: Secondary | ICD-10-CM | POA: Diagnosis not present

## 2021-01-08 DIAGNOSIS — Z95828 Presence of other vascular implants and grafts: Secondary | ICD-10-CM

## 2021-01-08 DIAGNOSIS — N189 Chronic kidney disease, unspecified: Secondary | ICD-10-CM | POA: Diagnosis not present

## 2021-01-08 DIAGNOSIS — Z452 Encounter for adjustment and management of vascular access device: Secondary | ICD-10-CM | POA: Diagnosis not present

## 2021-01-08 DIAGNOSIS — I509 Heart failure, unspecified: Secondary | ICD-10-CM | POA: Diagnosis not present

## 2021-01-08 DIAGNOSIS — Z5111 Encounter for antineoplastic chemotherapy: Secondary | ICD-10-CM | POA: Diagnosis not present

## 2021-01-08 LAB — CMP (CANCER CENTER ONLY)
ALT: 25 U/L (ref 0–44)
AST: 24 U/L (ref 15–41)
Albumin: 3.8 g/dL (ref 3.5–5.0)
Alkaline Phosphatase: 91 U/L (ref 38–126)
Anion gap: 10 (ref 5–15)
BUN: 17 mg/dL (ref 6–20)
CO2: 30 mmol/L (ref 22–32)
Calcium: 9.2 mg/dL (ref 8.9–10.3)
Chloride: 98 mmol/L (ref 98–111)
Creatinine: 1.29 mg/dL — ABNORMAL HIGH (ref 0.61–1.24)
GFR, Estimated: >60 mL/min (ref >60)
Glucose, Bld: 128 mg/dL — ABNORMAL HIGH (ref 70–99)
Potassium: 3.3 mmol/L — ABNORMAL LOW (ref 3.5–5.1)
Sodium: 138 mmol/L (ref 135–145)
Total Bilirubin: 0.9 mg/dL (ref 0.3–1.2)
Total Protein: 7.3 g/dL (ref 6.5–8.1)

## 2021-01-08 LAB — CBC WITH DIFFERENTIAL (CANCER CENTER ONLY)
Abs Immature Granulocytes: 0 10*3/uL (ref 0.00–0.07)
Basophils Absolute: 0 10*3/uL (ref 0.0–0.1)
Basophils Relative: 1 %
Eosinophils Absolute: 0.1 10*3/uL (ref 0.0–0.5)
Eosinophils Relative: 3 %
HCT: 41.4 % (ref 39.0–52.0)
Hemoglobin: 13.8 g/dL (ref 13.0–17.0)
Immature Granulocytes: 0 %
Lymphocytes Relative: 24 %
Lymphs Abs: 1 10*3/uL (ref 0.7–4.0)
MCH: 27.8 pg (ref 26.0–34.0)
MCHC: 33.3 g/dL (ref 30.0–36.0)
MCV: 83.5 fL (ref 80.0–100.0)
Monocytes Absolute: 1 10*3/uL (ref 0.1–1.0)
Monocytes Relative: 24 %
Neutro Abs: 2 10*3/uL (ref 1.7–7.7)
Neutrophils Relative %: 48 %
Platelet Count: 144 10*3/uL — ABNORMAL LOW (ref 150–400)
RBC: 4.96 MIL/uL (ref 4.22–5.81)
RDW: 17.2 % — ABNORMAL HIGH (ref 11.5–15.5)
WBC Count: 4 10*3/uL (ref 4.0–10.5)
nRBC: 0 % (ref 0.0–0.2)

## 2021-01-08 MED ORDER — OXALIPLATIN CHEMO INJECTION 100 MG/20ML
85.0000 mg/m2 | Freq: Once | INTRAVENOUS | Status: AC
  Administered 2021-01-08: 160 mg via INTRAVENOUS
  Filled 2021-01-08: qty 32

## 2021-01-08 MED ORDER — PALONOSETRON HCL INJECTION 0.25 MG/5ML
INTRAVENOUS | Status: AC
  Filled 2021-01-08: qty 5

## 2021-01-08 MED ORDER — LEUCOVORIN CALCIUM INJECTION 350 MG
400.0000 mg/m2 | Freq: Once | INTRAVENOUS | Status: AC
  Administered 2021-01-08: 764 mg via INTRAVENOUS
  Filled 2021-01-08: qty 38.2

## 2021-01-08 MED ORDER — DEXTROSE 5 % IV SOLN
Freq: Once | INTRAVENOUS | Status: AC
  Filled 2021-01-08: qty 250

## 2021-01-08 MED ORDER — SODIUM CHLORIDE 0.9% FLUSH
10.0000 mL | INTRAVENOUS | Status: DC | PRN
  Administered 2021-01-08: 10 mL via INTRAVENOUS
  Filled 2021-01-08: qty 10

## 2021-01-08 MED ORDER — PALONOSETRON HCL INJECTION 0.25 MG/5ML
0.2500 mg | Freq: Once | INTRAVENOUS | Status: AC
  Administered 2021-01-08: 0.25 mg via INTRAVENOUS

## 2021-01-08 MED ORDER — FLUOROURACIL CHEMO INJECTION 2.5 GM/50ML
400.0000 mg/m2 | Freq: Once | INTRAVENOUS | Status: AC
  Administered 2021-01-08: 750 mg via INTRAVENOUS
  Filled 2021-01-08: qty 15

## 2021-01-08 MED ORDER — SODIUM CHLORIDE 0.9 % IV SOLN
10.0000 mg | Freq: Once | INTRAVENOUS | Status: AC
  Administered 2021-01-08: 10 mg via INTRAVENOUS
  Filled 2021-01-08: qty 10

## 2021-01-08 MED ORDER — SODIUM CHLORIDE 0.9 % IV SOLN
2400.0000 mg/m2 | INTRAVENOUS | Status: DC
  Administered 2021-01-08: 4600 mg via INTRAVENOUS
  Filled 2021-01-08: qty 92

## 2021-01-08 NOTE — Patient Instructions (Signed)
[facility] Discharge Instructions for Patients Receiving Chemotherapy  Today you received the following chemotherapy agents: Oxaliplatin, Leucovorin, and Fluorouracil  To help prevent nausea and vomiting after your treatment, we encourage you to take your nausea medication  as prescribed.    If you develop nausea and vomiting that is not controlled by your nausea medication, call the clinic.   BELOW ARE SYMPTOMS THAT SHOULD BE REPORTED IMMEDIATELY:  *FEVER GREATER THAN 100.5 F  *CHILLS WITH OR WITHOUT FEVER  NAUSEA AND VOMITING THAT IS NOT CONTROLLED WITH YOUR NAUSEA MEDICATION  *UNUSUAL SHORTNESS OF BREATH  *UNUSUAL BRUISING OR BLEEDING  TENDERNESS IN MOUTH AND THROAT WITH OR WITHOUT PRESENCE OF ULCERS  *URINARY PROBLEMS  *BOWEL PROBLEMS  UNUSUAL RASH Items with * indicate a potential emergency and should be followed up as soon as possible.  Feel free to call the clinic should you have any questions or concerns. The clinic phone number is [phone].  Please show the CHEMO ALERT CARD at check-in to the Emergency Department and triage nurse.   

## 2021-01-08 NOTE — Progress Notes (Signed)
  [place] OFFICE PROGRESS NOTE   Diagnosis: Gastric cancer  INTERVAL HISTORY:   Tyler Wong completed another cycle of FOLFOX on 12/26/2020. He reports mild nausea following chemotherapy. He did not take antiemetics. He had mouth soreness, but no discrete ulcers. Cold sensitivity lasted 4 to 5 days following chemotherapy. No neuropathy symptoms at present. No abdominal pain. He is out of spironolactone.  Objective:  Vital signs in last 24 hours:  Blood pressure 97/68, pulse (!) 43, temperature (!) 97.3 F (36.3 C), temperature source Tympanic, resp. rate 15, height _0  (1.778 m), weight 167 lb 4.8 oz (75.9 kg), SpO2 99 %.    HEENT: No thrush or ulcers Resp: Lungs clear bilaterally Cardio: Regular rate and rhythm GI: No hepatosplenomegaly, no mass, nontender Vascular: No leg edema  Skin: Palms without erythema  Portacath/PICC-without erythema  Lab Results:  Lab Results  Component Value Date   WBC 4.0 01/08/2021   HGB 13.8 01/08/2021   HCT 41.4 01/08/2021   MCV 83.5 01/08/2021   PLT 144 (L) 01/08/2021   NEUTROABS 2.0 01/08/2021    CMP  Lab Results  Component Value Date   NA 138 01/08/2021   K 3.3 (L) 01/08/2021   CL 98 01/08/2021   CO2 30 01/08/2021   GLUCOSE 128 (H) 01/08/2021   BUN 17 01/08/2021   CREATININE 1.29 (H) 01/08/2021   CALCIUM 9.2 01/08/2021   PROT 7.3 01/08/2021   ALBUMIN 3.8 01/08/2021   AST 24 01/08/2021   ALT 25 01/08/2021   ALKPHOS 91 01/08/2021   BILITOT 0.9 01/08/2021   GFRNONAA >60 01/08/2021   GFRAA 55 (L) 09/21/2020    Lab Results  Component Value Date   CEA1 6.70 (H) 12/12/2020    Medications: I have reviewed the patient's current medications.   Assessment/Plan: 1. Gastric cancer  CT abdomen/pelvis 11/01/2020-masslike area measuring 6.3 x 5.6 x 5.6 cm in the antral prepyloric region of the stomach  Upper endoscopy 11/05/2020-patchy white plaques in the distal esophagus; 3 nonbleeding cratered gastric  ulcers with pigmented material in the gastric antrum largest 10 mm; segmental moderate inflammation characterized by congestion, erosions and erythema in the gastric antrum. Biopsy of stomach ulcer-intramucosal adenocarcinoma arising in a background of intestinal metaplasia, HER-2 negative  PET 11/2020-focal area of hypermetabolic activity at the gastric antrum, averages into the liver and small liver lesion adjacent to stomach not excluded, small chest lymph nodes with mild increase in metabolic activity-nonspecific, symmetric parotid and extraocular muscle activity-likely physiologic  Cycle 1 FOLFOX 12/12/2020  Cycle 2 FOLFOX 12/26/2020  Cycle 3 FOLFOX 01/08/2021 2. CHF, LVEF 20-25% 10/04/2020 3. CAD status post prior LAD stent 4. COPD 5. Chronic kidney disease 6. Cocaine usage 7. Port-A-Cath placement 12/06/2020, Interventional Radiology     Disposition: Tyler Wong appears stable. He is tolerating the FOLFOX well. He will complete cycle 3 today. The potassium is mildly decreased. He takes a potassium supplement daily. He will resume spironolactone.  Tyler Wong will return for an office visit in the next cycle of chemotherapy in 2 weeks.  He will be referred for a restaging CT evaluation after cycle 5 FOLFOX.  Betsy Coder, MD  01/08/2021  9:38 AM

## 2021-01-09 ENCOUNTER — Other Ambulatory Visit: Payer: Self-pay

## 2021-01-09 ENCOUNTER — Inpatient Hospital Stay: Payer: Medicaid Other

## 2021-01-09 ENCOUNTER — Telehealth: Payer: Self-pay | Admitting: Oncology

## 2021-01-09 ENCOUNTER — Telehealth: Payer: Self-pay | Admitting: *Deleted

## 2021-01-09 NOTE — Progress Notes (Signed)
Patient presents to the [facility] today d/t questionably dislodged infusion needle. States he has pain at [facility] site since pulling on line around 0100 this date. Patient informed this RN that he had called the infusion pump after hours number, and they instructed him to turn pump off. On arrival, infusion pump was off. On visual assessment, site appeared normal. No moisture noted on/under dressing. Brisk blood return noted. Patient c/o "pinching sensation" when he moves. Site deaccessed/reaccessed with no further issue. Patient verbalized he no longer felt the "pinching sensation" after he was reaccessed. Fluorouracil infusion resumed. Discussed situation with Dr. Benay Spice. Dr. Benay Spice advised to maintain pump at previous infusion rate with plans to discard remaining 5FU when patient returns to clinic for pump d/c. Patient aware and agrees to plan of care.

## 2021-01-09 NOTE — Telephone Encounter (Signed)
Notification from answering service that patient called during the night thinking he may have dislodged his port needle in his sleep. No pain or swelling at site. Was told to call office in am. Called patient and he will be here at 10:00 today. Notified scheduler.

## 2021-01-09 NOTE — Telephone Encounter (Signed)
Scheduled appointments per 1/12 los. Spoke to patient who is aware of appointments dates and times.  

## 2021-01-10 ENCOUNTER — Other Ambulatory Visit: Payer: Self-pay

## 2021-01-10 ENCOUNTER — Inpatient Hospital Stay: Payer: Medicaid Other

## 2021-01-10 VITALS — BP 109/80 | HR 93 | Resp 18

## 2021-01-10 DIAGNOSIS — Z5111 Encounter for antineoplastic chemotherapy: Secondary | ICD-10-CM | POA: Diagnosis not present

## 2021-01-10 DIAGNOSIS — I959 Hypotension, unspecified: Secondary | ICD-10-CM | POA: Diagnosis not present

## 2021-01-10 DIAGNOSIS — N189 Chronic kidney disease, unspecified: Secondary | ICD-10-CM | POA: Diagnosis not present

## 2021-01-10 DIAGNOSIS — C163 Malignant neoplasm of pyloric antrum: Secondary | ICD-10-CM | POA: Diagnosis not present

## 2021-01-10 DIAGNOSIS — I509 Heart failure, unspecified: Secondary | ICD-10-CM | POA: Diagnosis not present

## 2021-01-10 DIAGNOSIS — Z452 Encounter for adjustment and management of vascular access device: Secondary | ICD-10-CM | POA: Diagnosis not present

## 2021-01-10 DIAGNOSIS — J449 Chronic obstructive pulmonary disease, unspecified: Secondary | ICD-10-CM | POA: Diagnosis not present

## 2021-01-10 MED ORDER — SODIUM CHLORIDE 0.9% FLUSH
10.0000 mL | INTRAVENOUS | Status: DC | PRN
  Administered 2021-01-10: 10 mL
  Filled 2021-01-10: qty 10

## 2021-01-10 MED ORDER — HEPARIN SOD (PORK) LOCK FLUSH 100 UNIT/ML IV SOLN
500.0000 [IU] | Freq: Once | INTRAVENOUS | Status: AC | PRN
  Administered 2021-01-10: 500 [IU]
  Filled 2021-01-10: qty 5

## 2021-01-10 NOTE — Progress Notes (Signed)
Per Dr Benay Spice ok to take off pump early with 25 ml left

## 2021-01-19 ENCOUNTER — Other Ambulatory Visit: Payer: Self-pay | Admitting: Oncology

## 2021-01-20 ENCOUNTER — Ambulatory Visit (HOSPITAL_COMMUNITY)
Admission: RE | Admit: 2021-01-20 | Discharge: 2021-01-20 | Disposition: A | Discharge status: Home / Self Care | Payer: Medicaid Other | Source: Ambulatory Visit | Attending: Cardiology | Admitting: Cardiology

## 2021-01-20 ENCOUNTER — Encounter (HOSPITAL_COMMUNITY): Payer: Self-pay

## 2021-01-20 ENCOUNTER — Other Ambulatory Visit: Payer: Self-pay

## 2021-01-20 ENCOUNTER — Telehealth (HOSPITAL_COMMUNITY): Payer: Self-pay | Admitting: Cardiology

## 2021-01-20 VITALS — BP 116/84 | HR 108 | Wt 178.8 lb

## 2021-01-20 DIAGNOSIS — I255 Ischemic cardiomyopathy: Secondary | ICD-10-CM | POA: Insufficient documentation

## 2021-01-20 DIAGNOSIS — F17200 Nicotine dependence, unspecified, uncomplicated: Secondary | ICD-10-CM | POA: Diagnosis not present

## 2021-01-20 DIAGNOSIS — Z8616 Personal history of COVID-19: Secondary | ICD-10-CM | POA: Insufficient documentation

## 2021-01-20 DIAGNOSIS — I5022 Chronic systolic (congestive) heart failure: Secondary | ICD-10-CM | POA: Insufficient documentation

## 2021-01-20 DIAGNOSIS — I2510 Atherosclerotic heart disease of native coronary artery without angina pectoris: Secondary | ICD-10-CM | POA: Diagnosis not present

## 2021-01-20 DIAGNOSIS — Z8249 Family history of ischemic heart disease and other diseases of the circulatory system: Secondary | ICD-10-CM | POA: Insufficient documentation

## 2021-01-20 DIAGNOSIS — Z8379 Family history of other diseases of the digestive system: Secondary | ICD-10-CM | POA: Diagnosis not present

## 2021-01-20 DIAGNOSIS — Z7982 Long term (current) use of aspirin: Secondary | ICD-10-CM | POA: Diagnosis not present

## 2021-01-20 DIAGNOSIS — N183 Chronic kidney disease, stage 3 unspecified: Secondary | ICD-10-CM | POA: Insufficient documentation

## 2021-01-20 DIAGNOSIS — I252 Old myocardial infarction: Secondary | ICD-10-CM | POA: Insufficient documentation

## 2021-01-20 DIAGNOSIS — Z79899 Other long term (current) drug therapy: Secondary | ICD-10-CM | POA: Insufficient documentation

## 2021-01-20 DIAGNOSIS — Z955 Presence of coronary angioplasty implant and graft: Secondary | ICD-10-CM | POA: Diagnosis not present

## 2021-01-20 DIAGNOSIS — Z7984 Long term (current) use of oral hypoglycemic drugs: Secondary | ICD-10-CM | POA: Diagnosis not present

## 2021-01-20 DIAGNOSIS — J449 Chronic obstructive pulmonary disease, unspecified: Secondary | ICD-10-CM | POA: Diagnosis not present

## 2021-01-20 LAB — BASIC METABOLIC PANEL
Anion gap: 8 (ref 5–15)
BUN: 17 mg/dL (ref 6–20)
CO2: 26 mmol/L (ref 22–32)
Calcium: 9.1 mg/dL (ref 8.9–10.3)
Chloride: 103 mmol/L (ref 98–111)
Creatinine, Ser: 1.45 mg/dL — ABNORMAL HIGH (ref 0.61–1.24)
GFR, Estimated: 57 mL/min — ABNORMAL LOW (ref >60)
Glucose, Bld: 115 mg/dL — ABNORMAL HIGH (ref 70–99)
Potassium: 4.5 mmol/L (ref 3.5–5.1)
Sodium: 137 mmol/L (ref 135–145)

## 2021-01-20 LAB — LIPID PANEL
Cholesterol: 145 mg/dL (ref 0–200)
HDL: 61 mg/dL (ref >40)
LDL Cholesterol: 56 mg/dL (ref 0–99)
Total CHOL/HDL Ratio: 2.4 RATIO
Triglycerides: 140 mg/dL (ref <150)
VLDL: 28 mg/dL (ref 0–40)

## 2021-01-20 LAB — DIGOXIN LEVEL: Digoxin Level: <0.2 ng/mL — ABNORMAL LOW (ref 0.8–2.0)

## 2021-01-20 MED ORDER — POTASSIUM CHLORIDE CRYS ER 20 MEQ PO TBCR: 40.0000 meq | EXTENDED_RELEASE_TABLET | Freq: Every day | ORAL | 11 refills | Status: DC

## 2021-01-20 MED ORDER — ENTRESTO 24-26 MG PO TABS: 1.0000 | ORAL_TABLET | Freq: Two times a day (BID) | ORAL | 11 refills | Status: DC

## 2021-01-20 MED ORDER — POTASSIUM CHLORIDE CRYS ER 20 MEQ PO TBCR: 40.0000 meq | EXTENDED_RELEASE_TABLET | Freq: Every day | ORAL | 11 refills | Status: AC

## 2021-01-20 MED ORDER — TORSEMIDE 20 MG PO TABS: ORAL_TABLET | ORAL | 11 refills | Status: DC

## 2021-01-20 NOTE — Progress Notes (Signed)
Error

## 2021-01-20 NOTE — Progress Notes (Signed)
[facility] Note   Referring Physician: PCP: [name], NP AHFC: Dr. Aundra Dubin   Reason for Visit:  Chronic Systolic Heart Failure   HPI: 55 y.o. 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 Also w/ h/o poor compliance. Previously referred to paramedicine but not fully cooperative. Diagnosed w/ COVID 09/19/20.   Admitted 10/21 w/ acute on chronic systolic heart failure w/ low output and AKI requiring milrinone to help w/ diuresis. He was cocaine positive. Diuresed and weaned off milrinone w/ stable co-ox at 65%. Scr had improved from 2.1>>1.7. D/c wt 159 lb. Unfortunately, his wife was also hospitalized for COVID and died while he was admitted.   Readmitted again 11/21 with marked volume overload and low output heart failure, cocaine+ again. He complained of abdominal pain, initially suspected to be 2/2 low output HF but CT showeda mass-like area in the antral pre-pyloric region of the stomach. EGD with complicated distal stomach ulcers. Pathology confirmed gastric cancer, intramucosal adenocarcinoma. Seen by oncology and not felt to be a candidate for surgery given other comorbidities.   He follow with Dr. Benay Spice and has been getting FOLFOX chemotherapy.   He presents to clinic today for f/u of CHF. Here with his sister whom he currently resides with in [place] (thus no longer qualifies for paramedicine).  He was doing well up until about 3-4 days ago.  He has been walking 1-1.5 miles most days of the week without dyspnea.  Appetite is good.  He has been out of torsemide for 4 days, just got it back today and took first dose.  He noted orthopnea for the first time last night though he walked into the office without dyspnea.  Weight is up.  Still  smoking about 0.5 ppd.  Did not ask about cocaine in front of sister.  No ETOH. No chest pain.   REDS clip 38%  Labs (1/22): K 3.3, creatinine 1.29, hgb 13.8  Review of systems complete and found to be negative unless listed in HPI.    PMH: 1. CAD: STEMI 2015 with DES to LAD.  - Cath in 10/19 showed 90% ostial OM1 and 70% PLV, no interventional target. 2. Hyperlipidemia 3. Cocaine abuse.  4. COPD: Ongoing smoking.  5. GERD 6. Gastric cancer: Intramucosal adenocarcinoma.  - FOLFOX chemotherapy 7. Chronic systolic CHF:  Mixed ischemic/nonischemic (cocaine) cardiomyopathy.  Thought to be end stage, has had low output requiring milrinone in the hospital but not candidate for LVAD, transplant, or home milrinone given ongoing cocaine abuse.  - Echo (10/21): EF 20-25%, global HK with moderate dilation, mild LVH, RV low normal systolic function, PASP 57 mmHg, moderate-severe MR, moderate-severe TR.  8. COVID-19 infection: 9/21.    Current Outpatient Medications  Medication Sig Dispense Refill  . albuterol (VENTOLIN HFA) 108 (90 Base) MCG/ACT inhaler Inhale 2 puffs into the lungs every 4 (four) hours as needed for wheezing or shortness of breath. 6.7 g 1  . aspirin 81 MG EC tablet Take 1 tablet (81 mg total) by mouth daily. 30 tablet 11  . atorvastatin (LIPITOR) 80 MG tablet Take 1 tablet (80 mg total) by mouth daily. 30 tablet 11  . digoxin (LANOXIN) 0.125 MG tablet Take 1 tablet (0.125 mg total) by mouth daily. 30 tablet 11  . empagliflozin (  JARDIANCE) 10 MG TABS tablet Take 1 tablet (10 mg total) by mouth daily. 30 tablet 11  . isosorbide-hydrALAZINE (BIDIL) 20-37.5 MG tablet Take 1 tablet by mouth 3 (three) times daily. 90 tablet 11  . ivabradine (CORLANOR) 5 MG TABS tablet Take 1 tablet (5 mg total) by mouth 2 (two) times daily with a meal. 60 tablet 11  . lidocaine-prilocaine (EMLA) cream APPLY TO PORT TOPICALLY 1 TO 2 HOURS PRIOR TO STICK AND COVER WITH PLASTIC WRAP. 30 g 1  . Multiple  Vitamin (MULTIVITAMIN ADULT PO) Take 1 tablet by mouth daily.    Marland Kitchen omeprazole (PRILOSEC) 20 MG capsule TAKE ONE CAPSULE BY MOUTH ONCE DAILY (MORNING) 30 capsule 1  . pantoprazole (PROTONIX) 40 MG tablet Take 1 tablet (40 mg total) by mouth 2 (two) times daily. 60 tablet 2  . potassium chloride SA (KLOR-CON) 20 MEQ tablet Take 1 tablet (20 mEq total) by mouth daily. 30 tablet 11  . torsemide (DEMADEX) 20 MG tablet Take 4 tablets (80 mg total) by mouth every morning AND 2 tablets (40 mg total) every evening. 180 tablet 11  . ondansetron (ZOFRAN) 8 MG tablet TAKE 1 TABLET (8 MG TOTAL) BY MOUTH EVERY 8 (EIGHT) HOURS AS NEEDED FOR NAUSEA OR VOMITING.(Patient not taking: No sig reported) 30 tablet 1  . prochlorperazine (COMPAZINE) 10 MG tablet TAKE 1 TABLET (10 MG TOTAL) BY MOUTH EVERY 6 (SIX) HOURS AS NEEDED FOR NAUSEA. (Patient not taking: No sig reported) 60 tablet 1  . spironolactone (ALDACTONE) 25 MG tablet Take 1 tablet (25 mg total) by mouth daily. (Patient not taking: No sig reported) 30 tablet 11   No current facility-administered medications for this encounter.    No Known Allergies    Social History   Socioeconomic History  . Marital status: Single    Spouse name: Not on file  . Number of children: 2  . Years of education: 71  . Highest education level: 12th grade  Occupational History  . Occupation: Landscaping  Tobacco Use  . Smoking status: Current Every Day Smoker    Packs/day: 0.25    Years: 30.00    Pack years: 7.50    Types: Cigarettes  . Smokeless tobacco: Never Used  . Tobacco comment: .5 PK PER DAY  Vaping Use  . Vaping Use: Never used  Substance and Sexual Activity  . Alcohol use: No    Comment: Patient denies abuse, states social drinker  . Drug use: Yes    Types: Cocaine, "Crack" cocaine    Comment: heroin  . Sexual activity: Yes  Other Topics Concern  . Not on file  Social History Narrative   Lives in [place] with his sister and girlfriend. He works  Aeronautical engineer work.    Social Determinants of Health   Financial Resource Strain: Not on file  Food Insecurity: Food Insecurity Present  . Worried About Charity fundraiser in the Last Year: Sometimes true  . Ran Out of Food in the Last Year: Never true  Transportation Needs: No Transportation Needs  . Lack of Transportation (Medical): No  . Lack of Transportation (Non-Medical): No  Physical Activity: Not on file  Stress: Not on file  Social Connections: Not on file  Intimate Partner Violence: Not on file      Family History  Problem Relation Age of Onset  . Cirrhosis Father   . Heart attack Mother   . Heart attack Sister   . Heart failure Sister   .  Heart attack Brother     Vitals:   01/20/21 1325  BP: 116/84  Pulse: (!) 108  SpO2: 97%  Weight: 81.1 kg (178 lb 12.8 oz)   Wt Readings from Last 3 Encounters:  01/20/21 81.1 kg (178 lb 12.8 oz)  01/08/21 75.9 kg (167 lb 4.8 oz)  12/26/20 74 kg (163 lb 3.2 oz)   PHYSICAL EXAM:  ReDs Clip 38%  General: NAD Neck: JVP 8-9 cm with HJR, no thyromegaly or thyroid nodule.  Lungs: Clear to auscultation bilaterally with normal respiratory effort. CV: Mildly tachy, lateral PMI.  Heart regular S1/S2, no S3/S4, 2/6 HSM LLSB/apex.  No peripheral edema.  No carotid bruit.  Normal pedal pulses.  Abdomen: Soft, nontender, no hepatosplenomegaly, no distention.  Skin: Intact without lesions or rashes.  Neurologic: Alert and oriented x 3.  Psych: Normal affect. Extremities: No clubbing or cyanosis.  HEENT: Normal.   ASSESSMENT & PLAN: 1. Chronic Systolic HF:  Anterior MI in 2015, DES to LAD.  Echo in 2015 with EF 40-45%, thought to be ischemic cardiomyopathy. Cath in 10/19 showed 90% ostial OM1 and 70% PLV, no interventional target. 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 Unfortunately, not a candidate for advanced therapies or home milrinone with active use of cocaine and gastric cancer. He has end-stage HF.  On exam today, he is volume overloaded and weight is up.  He has been out of torsemide for 4 days, just took the first dose since he picked it up this morning. NYHA class II-III symptoms but noted orthopnea last night.  REDS clip mildly elevated at 38%. - Given social situation, gastric cancer, and ongoing substance abuse,  he is not a candidate for any advanced therapies including home milrinone - He will take torsemide 80 mg bid x 4 days then 80 qam/60 qpm.  Increase KCl to 40 daily.  BMET today and in 1 week.  - Start Entresto 24/26 bid (BP and renal function stable).   -Continue digoxin 0.125, check level today.  - Continue jardiance 10 mg daily  - Continue bidil 1 tab three times a day.  - Continue invabradine 7.5 twice a day.   - Not on spironolactone, will try to start next appt if BP and creatinine stable.  - No  blocker w/ h/o low output and cocaine use 2. CAD: H/o anterior STEMI with DES to LAD in 7/15. Cath in 10/19 with 90% ostial OM1 and 70% PLV, no interventional target. No chest pain.  -ContinueASA 81 and atorvastatin 80 mg daily.  - needs to refrain from using cocaine - Check lipids today.  - ECG today.  3. Cocaine abuse:Has been +cocaine each hospital visit.  Encouraged abstinence.  4. Active smoker: Urged cessation.  5. CKD: Stage 3.  BMET today.  6. Mitral Regurgitation: Moderate to severe on recent echo, suspect functional MR from dilated CMP.  - Not great Mitraclip candidate with cocaine abuse and compliance issues.  7. Gastric Cancer: Intramucosal adenocarcinoma.  - FOLFOX chemo ongoing.   F/u in 2 wks with APP to reassess volume status.   Loralie Champagne,  MD 01/20/21

## 2021-01-20 NOTE — Telephone Encounter (Signed)
appt cal lreminder of upcoming appt made Pt reports he is aware of appt details and has transportation scheduled

## 2021-01-20 NOTE — Progress Notes (Signed)
ReDS Vest / Clip - 01/20/21 1300      ReDS Vest / Clip   Station Marker C    Ruler Value 33    ReDS Value Range Moderate volume overload    ReDS Actual Value 38    Anatomical Comments sitting

## 2021-01-20 NOTE — Patient Instructions (Addendum)
START Entresto 24/26 mg, one tab twice a day INCREASE Torsemide to 80mg  twice a day for 4 days, then decrease to 80- mg in the AM and 60 mg in the PM INCREASE Potassium to 40 meq daily  Labs today We will only contact you if something comes back abnormal or we need to make some changes. Otherwise no news is good news!   Your physician recommends that you schedule a follow-up appointment in: 2 weeks  in the Advanced Practitioners (PA/NP) Clinic    If you have any questions or concerns before your next appointment please send [place] a message through [place] or call our office at [phone].    TO LEAVE A MESSAGE FOR THE NURSE SELECT OPTION 2, PLEASE LEAVE A MESSAGE INCLUDING: . YOUR NAME . DATE OF BIRTH . CALL BACK NUMBER . REASON FOR CALL**this is important as we prioritize the call backs  YOU WILL RECEIVE A CALL BACK THE SAME DAY AS LONG AS YOU CALL BEFORE 4:00 PM

## 2021-01-20 NOTE — Addendum Note (Signed)
Encounter addended by: Kerry Dory, CMA on: 01/20/2021 2:13 PM  Actions taken: Order list changed

## 2021-01-22 ENCOUNTER — Other Ambulatory Visit: Payer: Self-pay

## 2021-01-22 ENCOUNTER — Inpatient Hospital Stay: Payer: Medicaid Other

## 2021-01-22 ENCOUNTER — Encounter: Payer: Self-pay | Admitting: Nurse Practitioner

## 2021-01-22 ENCOUNTER — Inpatient Hospital Stay (HOSPITAL_BASED_OUTPATIENT_CLINIC_OR_DEPARTMENT_OTHER): Payer: Medicaid Other | Admitting: Nurse Practitioner

## 2021-01-22 ENCOUNTER — Inpatient Hospital Stay: Payer: Medicaid Other | Admitting: Nutrition

## 2021-01-22 VITALS — BP 91/59 | HR 102 | Temp 98.5°F | Resp 17 | Ht 70.0 in | Wt 168.7 lb

## 2021-01-22 DIAGNOSIS — I509 Heart failure, unspecified: Secondary | ICD-10-CM | POA: Diagnosis not present

## 2021-01-22 DIAGNOSIS — Z5111 Encounter for antineoplastic chemotherapy: Secondary | ICD-10-CM | POA: Diagnosis not present

## 2021-01-22 DIAGNOSIS — C163 Malignant neoplasm of pyloric antrum: Secondary | ICD-10-CM

## 2021-01-22 DIAGNOSIS — N189 Chronic kidney disease, unspecified: Secondary | ICD-10-CM | POA: Diagnosis not present

## 2021-01-22 DIAGNOSIS — Z95828 Presence of other vascular implants and grafts: Secondary | ICD-10-CM

## 2021-01-22 DIAGNOSIS — J449 Chronic obstructive pulmonary disease, unspecified: Secondary | ICD-10-CM | POA: Diagnosis not present

## 2021-01-22 DIAGNOSIS — I959 Hypotension, unspecified: Secondary | ICD-10-CM | POA: Diagnosis not present

## 2021-01-22 DIAGNOSIS — Z452 Encounter for adjustment and management of vascular access device: Secondary | ICD-10-CM | POA: Diagnosis not present

## 2021-01-22 LAB — CMP (CANCER CENTER ONLY)
ALT: 29 U/L (ref 0–44)
AST: 23 U/L (ref 15–41)
Albumin: 3.5 g/dL (ref 3.5–5.0)
Alkaline Phosphatase: 97 U/L (ref 38–126)
Anion gap: 9 (ref 5–15)
BUN: 22 mg/dL — ABNORMAL HIGH (ref 6–20)
CO2: 31 mmol/L (ref 22–32)
Calcium: 9 mg/dL (ref 8.9–10.3)
Chloride: 97 mmol/L — ABNORMAL LOW (ref 98–111)
Creatinine: 1.66 mg/dL — ABNORMAL HIGH (ref 0.61–1.24)
GFR, Estimated: 48 mL/min — ABNORMAL LOW (ref >60)
Glucose, Bld: 104 mg/dL — ABNORMAL HIGH (ref 70–99)
Potassium: 3.7 mmol/L (ref 3.5–5.1)
Sodium: 137 mmol/L (ref 135–145)
Total Bilirubin: 1.3 mg/dL — ABNORMAL HIGH (ref 0.3–1.2)
Total Protein: 7 g/dL (ref 6.5–8.1)

## 2021-01-22 LAB — CBC WITH DIFFERENTIAL (CANCER CENTER ONLY)
Abs Immature Granulocytes: 0.01 10*3/uL (ref 0.00–0.07)
Basophils Absolute: 0.1 10*3/uL (ref 0.0–0.1)
Basophils Relative: 1 %
Eosinophils Absolute: 0.1 10*3/uL (ref 0.0–0.5)
Eosinophils Relative: 2 %
HCT: 41.2 % (ref 39.0–52.0)
Hemoglobin: 13.7 g/dL (ref 13.0–17.0)
Immature Granulocytes: 0 %
Lymphocytes Relative: 18 %
Lymphs Abs: 1 10*3/uL (ref 0.7–4.0)
MCH: 27.5 pg (ref 26.0–34.0)
MCHC: 33.3 g/dL (ref 30.0–36.0)
MCV: 82.7 fL (ref 80.0–100.0)
Monocytes Absolute: 1.5 10*3/uL — ABNORMAL HIGH (ref 0.1–1.0)
Monocytes Relative: 26 %
Neutro Abs: 3.1 10*3/uL (ref 1.7–7.7)
Neutrophils Relative %: 53 %
Platelet Count: 143 10*3/uL — ABNORMAL LOW (ref 150–400)
RBC: 4.98 MIL/uL (ref 4.22–5.81)
RDW: 18.7 % — ABNORMAL HIGH (ref 11.5–15.5)
WBC Count: 5.8 10*3/uL (ref 4.0–10.5)
nRBC: 0 % (ref 0.0–0.2)

## 2021-01-22 LAB — MAGNESIUM: Magnesium: 2.5 mg/dL — ABNORMAL HIGH (ref 1.7–2.4)

## 2021-01-22 MED ORDER — OXALIPLATIN CHEMO INJECTION 100 MG/20ML
85.0000 mg/m2 | Freq: Once | INTRAVENOUS | Status: AC
  Administered 2021-01-22: 160 mg via INTRAVENOUS
  Filled 2021-01-22: qty 32

## 2021-01-22 MED ORDER — PALONOSETRON HCL INJECTION 0.25 MG/5ML
0.2500 mg | Freq: Once | INTRAVENOUS | Status: AC
  Administered 2021-01-22: 0.25 mg via INTRAVENOUS

## 2021-01-22 MED ORDER — DEXTROSE 5 % IV SOLN
Freq: Once | INTRAVENOUS | Status: AC
  Filled 2021-01-22: qty 250

## 2021-01-22 MED ORDER — SODIUM CHLORIDE 0.9% FLUSH
10.0000 mL | INTRAVENOUS | Status: DC | PRN
  Administered 2021-01-22: 10 mL via INTRAVENOUS
  Filled 2021-01-22: qty 10

## 2021-01-22 MED ORDER — FLUOROURACIL CHEMO INJECTION 2.5 GM/50ML
400.0000 mg/m2 | Freq: Once | INTRAVENOUS | Status: AC
  Administered 2021-01-22: 750 mg via INTRAVENOUS
  Filled 2021-01-22: qty 15

## 2021-01-22 MED ORDER — FLUOROURACIL CHEMO INJECTION 5 GM/100ML
2400.0000 mg/m2 | INTRAVENOUS | Status: DC
  Administered 2021-01-22: 4600 mg via INTRAVENOUS
  Filled 2021-01-22: qty 92

## 2021-01-22 MED ORDER — SODIUM CHLORIDE 0.9% FLUSH
10.0000 mL | INTRAVENOUS | Status: DC | PRN
Start: 2021-01-22 — End: 2021-01-22
  Filled 2021-01-22: qty 10

## 2021-01-22 MED ORDER — SODIUM CHLORIDE 0.9 % IV SOLN
10.0000 mg | Freq: Once | INTRAVENOUS | Status: AC
  Administered 2021-01-22: 10 mg via INTRAVENOUS
  Filled 2021-01-22: qty 10

## 2021-01-22 MED ORDER — PALONOSETRON HCL INJECTION 0.25 MG/5ML
INTRAVENOUS | Status: AC
  Filled 2021-01-22: qty 5

## 2021-01-22 MED ORDER — LEUCOVORIN CALCIUM INJECTION 350 MG
400.0000 mg/m2 | Freq: Once | INTRAVENOUS | Status: AC
  Administered 2021-01-22: 764 mg via INTRAVENOUS
  Filled 2021-01-22: qty 38.2

## 2021-01-22 MED ORDER — HEPARIN SOD (PORK) LOCK FLUSH 100 UNIT/ML IV SOLN
500.0000 [IU] | Freq: Once | INTRAVENOUS | Status: DC | PRN
  Filled 2021-01-22: qty 5

## 2021-01-22 NOTE — Progress Notes (Signed)
  [facility] OFFICE PROGRESS NOTE   Diagnosis: Gastric cancer   INTERVAL HISTORY:   Mr. Elahi returns as scheduled.  He completed cycle 3 FOLFOX 01/08/2021.  He had mild nausea.  No vomiting.  No mouth sores.  No diarrhea.  Cold sensitivity lasted about a week.  No persistent neuropathy symptoms.  He saw Dr. Aundra Dubin on 01/20/2021, volume overloaded/weight increased.  He had been out of torsemide for 4 days.  Resumed/dose adjusted.  He is feeling better.  No lightheadedness or dizziness.  Objective:  Vital signs in last 24 hours:  Blood pressure (!) 91/59, pulse (!) 102, temperature 98.5 F (36.9 C), temperature source Tympanic, resp. rate 17, height _0  (1.778 m), weight 168 lb 11.2 oz (76.5 kg), SpO2 98 %.    HEENT: Mild white coating over tongue.  No buccal thrush. Resp: Lungs clear bilaterally. Cardio: Regular rate and rhythm. GI: Abdomen soft and nontender.  No hepatomegaly. Vascular: No leg edema.  Skin: Palms dry appearing, skin hyperpigmented. Port-A-Cath without erythema.   Lab Results:  Lab Results  Component Value Date   WBC 5.8 01/22/2021   HGB 13.7 01/22/2021   HCT 41.2 01/22/2021   MCV 82.7 01/22/2021   PLT 143 (L) 01/22/2021   NEUTROABS 3.1 01/22/2021    Imaging:  No results found.  Medications: I have reviewed the patient's current medications.  Assessment/Plan: 1. Gastric cancer  CT abdomen/pelvis 11/01/2020-masslike area measuring 6.3 x 5.6 x 5.6 cm in the antral prepyloric region of the stomach  Upper endoscopy 11/05/2020-patchy white plaques in the distal esophagus; 3 nonbleeding cratered gastric ulcers with pigmented material in the gastric antrum largest 10 mm; segmental moderate inflammation characterized by congestion, erosions and erythema in the gastric antrum. 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 CHF, LVEF 20-25% 10/04/2020 3. CAD status post prior LAD stent 4. COPD 5. Chronic kidney disease 6. Cocaine usage 7. Port-A-Cath placement 12/06/2020, Interventional Radiology   Disposition: Mr. Lumadue appears stable.  He has completed 3 cycles of FOLFOX.  Plan to proceed with cycle 4 today as scheduled.  Restaging CTs after cycle 5.  We reviewed the CBC and chemistry panel from today.  Labs adequate to proceed with treatment.  Diuretic regimen was adjusted 2 days ago by Dr. Aundra Dubin due to volume overload.  His weight is down 10 pounds, creatinine increased from baseline, mild hypotension.  He is likely mildly dehydrated.  We will forward this information to Dr. Claris Gladden office for any further adjustments to diuretics.  He will return for lab, follow-up, cycle 5 FOLFOX in 2 weeks.  He will contact the office in the interim with any problems.  Plan reviewed with Dr. Burr Medico.    Ned Card ANP/GNP-BC   01/22/2021  9:59 AM

## 2021-01-22 NOTE — Patient Instructions (Signed)
[facility] Discharge Instructions for Patients Receiving Chemotherapy  Today you received the following chemotherapy agents oxaliplatin, leucovorin, fluorourcil.  To help prevent nausea and vomiting after your treatment, we encourage you to take your nausea medication as directed.   If you develop nausea and vomiting that is not controlled by your nausea medication, call the clinic.   BELOW ARE SYMPTOMS THAT SHOULD BE REPORTED IMMEDIATELY:  *FEVER GREATER THAN 100.5 F  *CHILLS WITH OR WITHOUT FEVER  NAUSEA AND VOMITING THAT IS NOT CONTROLLED WITH YOUR NAUSEA MEDICATION  *UNUSUAL SHORTNESS OF BREATH  *UNUSUAL BRUISING OR BLEEDING  TENDERNESS IN MOUTH AND THROAT WITH OR WITHOUT PRESENCE OF ULCERS  *URINARY PROBLEMS  *BOWEL PROBLEMS  UNUSUAL RASH Items with * indicate a potential emergency and should be followed up as soon as possible.  Feel free to call the clinic should you have any questions or concerns. The clinic phone number is [phone].  Please show the CHEMO ALERT CARD at check-in to the Emergency Department and triage nurse.   

## 2021-01-22 NOTE — Progress Notes (Signed)
Provided 1 complementary case of Ensure plus. Brief discussion had with patient in the lobby after treatment today. Reports recent weight loss to 168.7 pounds.   Noted patient has been retaining fluids. He has a good appetite and wants to drink Ensure Plus twice daily.  He hopes to have continued assistance with receiving cases of Ensure through the cancer center. Agree with Ensure twice daily.  Will continue to assist patient with cases of Ensure as available.

## 2021-01-22 NOTE — Progress Notes (Signed)
Per Ned Card, NP: OK to treat today with creatinine 1.66

## 2021-01-23 ENCOUNTER — Telehealth: Payer: Self-pay | Admitting: Nurse Practitioner

## 2021-01-23 NOTE — Telephone Encounter (Signed)
Scheduled appointments per 12/6 los. Spoke to patient who is aware of appointments dates and times.  

## 2021-01-24 ENCOUNTER — Telehealth (HOSPITAL_COMMUNITY): Payer: Self-pay | Admitting: Surgery

## 2021-01-24 ENCOUNTER — Other Ambulatory Visit: Payer: Self-pay

## 2021-01-24 ENCOUNTER — Inpatient Hospital Stay: Payer: Medicaid Other

## 2021-01-24 VITALS — BP 98/82 | Temp 98.0°F | Resp 18

## 2021-01-24 DIAGNOSIS — Z452 Encounter for adjustment and management of vascular access device: Secondary | ICD-10-CM | POA: Diagnosis not present

## 2021-01-24 DIAGNOSIS — J449 Chronic obstructive pulmonary disease, unspecified: Secondary | ICD-10-CM | POA: Diagnosis not present

## 2021-01-24 DIAGNOSIS — C163 Malignant neoplasm of pyloric antrum: Secondary | ICD-10-CM

## 2021-01-24 DIAGNOSIS — I509 Heart failure, unspecified: Secondary | ICD-10-CM | POA: Diagnosis not present

## 2021-01-24 DIAGNOSIS — N189 Chronic kidney disease, unspecified: Secondary | ICD-10-CM | POA: Diagnosis not present

## 2021-01-24 DIAGNOSIS — Z5111 Encounter for antineoplastic chemotherapy: Secondary | ICD-10-CM | POA: Diagnosis not present

## 2021-01-24 DIAGNOSIS — I959 Hypotension, unspecified: Secondary | ICD-10-CM | POA: Diagnosis not present

## 2021-01-24 MED ORDER — HEPARIN SOD (PORK) LOCK FLUSH 100 UNIT/ML IV SOLN
500.0000 [IU] | Freq: Once | INTRAVENOUS | Status: AC | PRN
  Administered 2021-01-24: 500 [IU]
  Filled 2021-01-24: qty 5

## 2021-01-24 MED ORDER — SODIUM CHLORIDE 0.9% FLUSH
10.0000 mL | INTRAVENOUS | Status: DC | PRN
  Administered 2021-01-24: 10 mL
  Filled 2021-01-24: qty 10

## 2021-01-24 MED ORDER — TORSEMIDE 20 MG PO TABS: 80.0000 mg | ORAL_TABLET | Freq: Every day | ORAL | 11 refills | Status: DC

## 2021-01-24 NOTE — Telephone Encounter (Signed)
I called Mr. Zeimet regarding decrease in Torsemide 80 mg daily.  He acknowledges understanding.  I made a repeat Bmet appt in [facility] for Thursday 2/3 at 10:30Am.  He also has AHF follow-up appt already scheduled 02/04/2021 at 0830.

## 2021-01-24 NOTE — Telephone Encounter (Signed)
-----   Message from [name], MD sent at 01/22/2021 11:32 AM EST ----- Regarding: FW: Labs/Hypotension Decrease torsemide to 80 mg daily for now.  BMET again in next week.  Make sure he has close followup.   ----- Message ----- From: Tania Ade, RN Sent: 01/22/2021  10:47 AM EST To: [name], MD, Owens Shark, NP Subject: Labs/Hypotension                               Dr. Kirk Ruths, Patient at [facility] today with mild hypotension and ~ 10 weight loss in last few days. He is feeling well. Creatinine is also slightly elevated at 1.66. Wanted to alert you in case you felt a change in his diuretic is indicated. Labs are in [place].  Thank you, Merceda Elks, RN

## 2021-01-24 NOTE — Patient Instructions (Signed)

## 2021-01-26 DIAGNOSIS — C169 Malignant neoplasm of stomach, unspecified: Secondary | ICD-10-CM | POA: Diagnosis not present

## 2021-01-28 ENCOUNTER — Other Ambulatory Visit (HOSPITAL_COMMUNITY): Payer: Self-pay | Admitting: Cardiology

## 2021-01-28 ENCOUNTER — Other Ambulatory Visit (INDEPENDENT_AMBULATORY_CARE_PROVIDER_SITE_OTHER): Payer: Self-pay | Admitting: Primary Care

## 2021-01-28 DIAGNOSIS — I2510 Atherosclerotic heart disease of native coronary artery without angina pectoris: Secondary | ICD-10-CM

## 2021-01-28 DIAGNOSIS — Z9861 Coronary angioplasty status: Secondary | ICD-10-CM

## 2021-01-30 ENCOUNTER — Other Ambulatory Visit: Payer: Self-pay

## 2021-01-30 ENCOUNTER — Ambulatory Visit (HOSPITAL_COMMUNITY)
Admission: RE | Admit: 2021-01-30 | Discharge: 2021-01-30 | Disposition: A | Discharge status: Home / Self Care | Payer: Medicaid Other | Source: Ambulatory Visit | Attending: Cardiology | Admitting: Cardiology

## 2021-01-30 DIAGNOSIS — I5022 Chronic systolic (congestive) heart failure: Secondary | ICD-10-CM | POA: Insufficient documentation

## 2021-01-30 LAB — BASIC METABOLIC PANEL
Anion gap: 13 (ref 5–15)
BUN: 26 mg/dL — ABNORMAL HIGH (ref 6–20)
CO2: 29 mmol/L (ref 22–32)
Calcium: 9.3 mg/dL (ref 8.9–10.3)
Chloride: 95 mmol/L — ABNORMAL LOW (ref 98–111)
Creatinine, Ser: 1.39 mg/dL — ABNORMAL HIGH (ref 0.61–1.24)
GFR, Estimated: 60 mL/min — ABNORMAL LOW (ref >60)
Glucose, Bld: 104 mg/dL — ABNORMAL HIGH (ref 70–99)
Potassium: 4.1 mmol/L (ref 3.5–5.1)
Sodium: 137 mmol/L (ref 135–145)

## 2021-02-01 ENCOUNTER — Other Ambulatory Visit: Payer: Self-pay | Admitting: Oncology

## 2021-02-03 NOTE — Progress Notes (Incomplete)
Advanced Heart Failure Clinic Note   Referring Physician: PCP: [name], NP AHFC: Dr. Aundra Dubin   Reason for Visit:  Chronic Systolic Heart Failure   HPI: 55 y.o. 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 Also w/ h/o poor compliance. Previously referred to paramedicine but not fully cooperative. Diagnosed w/ COVID 09/19/20.   Admitted 10/21 w/ acute on chronic systolic heart failure w/ low output and AKI requiring milrinone to help w/ diuresis. He was cocaine positive. Diuresed and weaned off milrinone w/ stable co-ox at 65%. Scr had improved from 2.1>>1.7. D/c wt 159 lb. Unfortunately, his wife was also hospitalized for COVID and died while he was admitted.   Readmitted again 11/21 with marked volume overload and low output heart failure, cocaine+ again. He complained of abdominal pain, initially suspected to be 2/2 low output HF but CT showeda mass-like area in the antral pre-pyloric region of the stomach. EGD with complicated distal stomach ulcers. Pathology confirmed gastric cancer, intramucosal adenocarcinoma. Seen by oncology and not felt to be a candidate for surgery given other comorbidities.   He follow with Dr. Benay Spice and has been getting FOLFOX chemotherapy.   He presented to clinic 1/22 for f/u of CHF. Here with his sister whom he currently resides with in [place] (thus no longer qualifies for paramedicine).  He was doing well up until about 3-4 days ago.  He has been walking 1-1.5 miles most days of the week without dyspnea.  He had been out of torsemide for 4 days, weight up.  He noted orthopnea for the first time last night though he walked into the office without dyspnea. Still smoking about 0.5 ppd.  Did not ask about cocaine in front of sister.   No ETOH. No chest pain. He was instructed to take torsemide 80 mg bid x 4 days then 80 qam/60 qpm as well as Start Entresto 24/26 bid.  Today she returns for HF follow up. Overall feeling fine. Denies increasing SOB, CP, dizziness, edema, or PND/Orthopnea. Appetite ok. No fever or chills. Weight at home 170 pounds. Taking all medications.   REDS clip 38%  Labs (1/22): K 3.3, creatinine 1.29, hgb 13.8  Review of systems complete and found to be negative unless listed in HPI.    PMH: 1. CAD: STEMI 2015 with DES to LAD.  - Cath in 10/19 showed 90% ostial OM1 and 70% PLV, no interventional target. 2. Hyperlipidemia 3. Cocaine abuse.  4. COPD: Ongoing smoking.  5. GERD 6. Gastric cancer: Intramucosal adenocarcinoma.  - FOLFOX chemotherapy 7. Chronic systolic CHF:  Mixed ischemic/nonischemic (cocaine) cardiomyopathy.  Thought to be end stage, has had low output requiring milrinone in the hospital but not candidate for LVAD, transplant, or home milrinone given ongoing cocaine abuse.  - Echo (10/21): EF 20-25%, global HK with moderate dilation, mild LVH, RV low normal systolic function, PASP 57 mmHg, moderate-severe MR, moderate-severe TR.  8. COVID-19 infection: 9/21.    Current Outpatient Medications  Medication Sig Dispense Refill  . albuterol (VENTOLIN HFA) 108 (90 Base) MCG/ACT inhaler Inhale 2 puffs into the lungs every 4 (four) hours as needed for wheezing or shortness of breath. 6.7 g 1  . aspirin 81 MG EC tablet Take 1 tablet (81 mg total) by mouth daily. 30 tablet 11  .  atorvastatin (LIPITOR) 80 MG tablet Take 1 tablet (80 mg total) by mouth daily. 30 tablet 11  . digoxin (LANOXIN) 0.125 MG tablet Take 1 tablet (0.125 mg total) by mouth daily. 30 tablet 11  . empagliflozin (JARDIANCE) 10 MG TABS tablet Take 1 tablet (10 mg total) by mouth daily. 30 tablet 11  . isosorbide-hydrALAZINE (BIDIL) 20-37.5 MG tablet Take 1 tablet by mouth 3 (three) times daily. 90 tablet 11  . ivabradine  (CORLANOR) 5 MG TABS tablet Take 1 tablet (5 mg total) by mouth 2 (two) times daily with a meal. 60 tablet 11  . lidocaine-prilocaine (EMLA) cream APPLY TO PORT TOPICALLY 1 TO 2 HOURS PRIOR TO STICK AND COVER WITH PLASTIC WRAP. 30 g 1  . Multiple Vitamin (MULTIVITAMIN ADULT PO) Take 1 tablet by mouth daily.    Marland Kitchen omeprazole (PRILOSEC) 20 MG capsule TAKE ONE CAPSULE BY MOUTH ONCE DAILY (MORNING) 30 capsule 1  . pantoprazole (PROTONIX) 40 MG tablet Take 1 tablet (40 mg total) by mouth 2 (two) times daily. 60 tablet 2  . potassium chloride SA (KLOR-CON) 20 MEQ tablet Take 1 tablet (20 mEq total) by mouth daily. 30 tablet 11  . torsemide (DEMADEX) 20 MG tablet Take 4 tablets (80 mg total) by mouth every morning AND 2 tablets (40 mg total) every evening. 180 tablet 11  . ondansetron (ZOFRAN) 8 MG tablet TAKE 1 TABLET (8 MG TOTAL) BY MOUTH EVERY 8 (EIGHT) HOURS AS NEEDED FOR NAUSEA OR VOMITING.(Patient not taking: No sig reported) 30 tablet 1  . prochlorperazine (COMPAZINE) 10 MG tablet TAKE 1 TABLET (10 MG TOTAL) BY MOUTH EVERY 6 (SIX) HOURS AS NEEDED FOR NAUSEA. (Patient not taking: No sig reported) 60 tablet 1  . spironolactone (ALDACTONE) 25 MG tablet Take 1 tablet (25 mg total) by mouth daily. (Patient not taking: No sig reported) 30 tablet 11   No current facility-administered medications for this encounter.    No Known Allergies    Social History   Socioeconomic History  . Marital status: Single    Spouse name: Not on file  . Number of children: 2  . Years of education: 72  . Highest education level: 12th grade  Occupational History  . Occupation: Landscaping  Tobacco Use  . Smoking status: Current Every Day Smoker    Packs/day: 0.25    Years: 30.00    Pack years: 7.50    Types: Cigarettes  . Smokeless tobacco: Never Used  . Tobacco comment: .5 PK PER DAY  Vaping Use  . Vaping Use: Never used  Substance and Sexual Activity  . Alcohol use: No    Comment: Patient denies abuse,  states social drinker  . Drug use: Yes    Types: Cocaine, "Crack" cocaine    Comment: heroin  . Sexual activity: Yes  Other Topics Concern  . Not on file  Social History Narrative   Lives in [place] with his sister and girlfriend. He works Aeronautical engineer work.    Social Determinants of Health   Financial Resource Strain: Not on file  Food Insecurity: Food Insecurity Present  . Worried About Charity fundraiser in the Last Year: Sometimes true  . Ran Out of Food in the Last Year: Never true  Transportation Needs: No Transportation Needs  . Lack of Transportation (Medical): No  . Lack of Transportation (Non-Medical): No  Physical Activity: Not on file  Stress: Not on file  Social Connections: Not on file  Intimate Partner Violence: Not on  file      Family History  Problem Relation Age of Onset  . Cirrhosis Father   . Heart attack Mother   . Heart attack Sister   . Heart failure Sister   . Heart attack Brother     There were no vitals filed for this visit. Wt Readings from Last 3 Encounters:  01/22/21 76.5 kg (168 lb 11.2 oz)  01/20/21 81.1 kg (178 lb 12.8 oz)  01/08/21 75.9 kg (167 lb 4.8 oz)   PHYSICAL EXAM:  ReDs Clip 38%  General: NAD Neck: JVP 8-9 cm with HJR, no thyromegaly or thyroid nodule.  Lungs: Clear to auscultation bilaterally with normal respiratory effort. CV: Mildly tachy, lateral PMI.  Heart regular S1/S2, no S3/S4, 2/6 HSM LLSB/apex.  No peripheral edema.  No carotid bruit.  Normal pedal pulses.  Abdomen: Soft, nontender, no hepatosplenomegaly, no distention.  Skin: Intact without lesions or rashes.  Neurologic: Alert and oriented x 3.  Psych: Normal affect. Extremities: No clubbing or cyanosis.  HEENT: Normal.   ASSESSMENT & PLAN: 1. Chronic Systolic HF:  Anterior MI in 2015, DES to LAD.  Echo in 2015 with EF 40-45%, thought to be ischemic cardiomyopathy. Cath in 10/19 showed 90% ostial OM1 and 70% PLV, no interventional target.  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 Last Echo 10/21 showed EF 20-25% with mild RV dysfunction.He has a strong family history of CHF/cardiomyopathy, so may be a component of familial cardiomyopathy.Cocaine likely plays a role in his cardiomyopathy.Multiple re-admits forcardiogenic shockin the last 6 months requiring milrinone. Unfortunately, not a candidate for advanced therapies or home milrinone with active use of cocaine and gastric cancer. He has end-stage HF.  On exam today, he is volume overloaded and weight is up.  He has been out of torsemide for 4 days, just took the first dose since he picked it up this morning. NYHA class II-III symptoms but noted orthopnea last night.  REDS clip mildly elevated at 38%. - Given social situation, gastric cancer, and ongoing substance abuse,  he is not a candidate for any advanced therapies including home milrinone - He will take torsemide 80 mg bid x 4 days then 80 qam/60 qpm.  Increase KCl to 40 daily.  BMET today and in 1 week.  - Continue Entresto 24/26 bid (BP and renal function stable).   -Continue digoxin 0.125, check level today.  - Continue jardiance 10 mg daily  - Continue bidil 1 tab three times a day.  - Continue invabradine 7.5 twice a day.   - Not on spironolactone, will try to start next appt if BP and creatinine stable.  - No  blocker w/ h/o low output and cocaine use 2. CAD: H/o anterior STEMI with DES to LAD in 7/15. Cath in 10/19 with 90% ostial OM1 and 70% PLV, no interventional target. No chest pain.  -ContinueASA 81 and atorvastatin 80 mg daily.  - needs to refrain from using cocaine - Check lipids today.  - ECG today.  3. Cocaine abuse:Has been +cocaine each hospital visit.  Encouraged abstinence.  4. Active smoker: Urged cessation.  5. CKD: Stage 3.  BMET today.  6. Mitral Regurgitation: Moderate to severe on recent echo, suspect functional MR from dilated CMP.  - Not great Mitraclip candidate  with cocaine abuse and compliance issues.  7. Gastric Cancer: Intramucosal adenocarcinoma.  - FOLFOX chemo ongoing.   F/u in 2 wks with APP to reassess volume status.   [place], FNP-BC 02/03/21

## 2021-02-04 ENCOUNTER — Encounter (HOSPITAL_COMMUNITY): Payer: Medicaid Other

## 2021-02-05 ENCOUNTER — Inpatient Hospital Stay (HOSPITAL_BASED_OUTPATIENT_CLINIC_OR_DEPARTMENT_OTHER): Payer: Medicaid Other | Admitting: Oncology

## 2021-02-05 ENCOUNTER — Inpatient Hospital Stay: Payer: Medicaid Other

## 2021-02-05 ENCOUNTER — Other Ambulatory Visit: Payer: Self-pay

## 2021-02-05 ENCOUNTER — Inpatient Hospital Stay: Payer: Medicaid Other | Attending: Oncology

## 2021-02-05 VITALS — BP 91/60 | HR 92 | Temp 97.8°F | Resp 18 | Ht 70.0 in | Wt 171.0 lb

## 2021-02-05 DIAGNOSIS — Z5111 Encounter for antineoplastic chemotherapy: Secondary | ICD-10-CM | POA: Diagnosis not present

## 2021-02-05 DIAGNOSIS — C163 Malignant neoplasm of pyloric antrum: Secondary | ICD-10-CM | POA: Insufficient documentation

## 2021-02-05 DIAGNOSIS — Z5189 Encounter for other specified aftercare: Secondary | ICD-10-CM | POA: Diagnosis not present

## 2021-02-05 DIAGNOSIS — Z452 Encounter for adjustment and management of vascular access device: Secondary | ICD-10-CM | POA: Insufficient documentation

## 2021-02-05 DIAGNOSIS — D701 Agranulocytosis secondary to cancer chemotherapy: Secondary | ICD-10-CM | POA: Insufficient documentation

## 2021-02-05 DIAGNOSIS — N189 Chronic kidney disease, unspecified: Secondary | ICD-10-CM | POA: Diagnosis not present

## 2021-02-05 DIAGNOSIS — Z95828 Presence of other vascular implants and grafts: Secondary | ICD-10-CM

## 2021-02-05 LAB — CBC WITH DIFFERENTIAL (CANCER CENTER ONLY)
Abs Immature Granulocytes: 0.01 10*3/uL (ref 0.00–0.07)
Basophils Absolute: 0 10*3/uL (ref 0.0–0.1)
Basophils Relative: 1 %
Eosinophils Absolute: 0.1 10*3/uL (ref 0.0–0.5)
Eosinophils Relative: 4 %
HCT: 38.9 % — ABNORMAL LOW (ref 39.0–52.0)
Hemoglobin: 13 g/dL (ref 13.0–17.0)
Immature Granulocytes: 0 %
Lymphocytes Relative: 29 %
Lymphs Abs: 1 10*3/uL (ref 0.7–4.0)
MCH: 27.9 pg (ref 26.0–34.0)
MCHC: 33.4 g/dL (ref 30.0–36.0)
MCV: 83.5 fL (ref 80.0–100.0)
Monocytes Absolute: 0.8 10*3/uL (ref 0.1–1.0)
Monocytes Relative: 23 %
Neutro Abs: 1.4 10*3/uL — ABNORMAL LOW (ref 1.7–7.7)
Neutrophils Relative %: 43 %
Platelet Count: 144 10*3/uL — ABNORMAL LOW (ref 150–400)
RBC: 4.66 MIL/uL (ref 4.22–5.81)
RDW: 19.5 % — ABNORMAL HIGH (ref 11.5–15.5)
WBC Count: 3.3 10*3/uL — ABNORMAL LOW (ref 4.0–10.5)
nRBC: 0 % (ref 0.0–0.2)

## 2021-02-05 LAB — CMP (CANCER CENTER ONLY)
ALT: 25 U/L (ref 0–44)
AST: 24 U/L (ref 15–41)
Albumin: 3.6 g/dL (ref 3.5–5.0)
Alkaline Phosphatase: 86 U/L (ref 38–126)
Anion gap: 8 (ref 5–15)
BUN: 31 mg/dL — ABNORMAL HIGH (ref 6–20)
CO2: 30 mmol/L (ref 22–32)
Calcium: 8.9 mg/dL (ref 8.9–10.3)
Chloride: 98 mmol/L (ref 98–111)
Creatinine: 1.37 mg/dL — ABNORMAL HIGH (ref 0.61–1.24)
GFR, Estimated: >60 mL/min (ref >60)
Glucose, Bld: 119 mg/dL — ABNORMAL HIGH (ref 70–99)
Potassium: 3.8 mmol/L (ref 3.5–5.1)
Sodium: 136 mmol/L (ref 135–145)
Total Bilirubin: 0.8 mg/dL (ref 0.3–1.2)
Total Protein: 6.8 g/dL (ref 6.5–8.1)

## 2021-02-05 MED ORDER — FLUOROURACIL CHEMO INJECTION 2.5 GM/50ML
400.0000 mg/m2 | Freq: Once | INTRAVENOUS | Status: AC
  Administered 2021-02-05: 750 mg via INTRAVENOUS
  Filled 2021-02-05: qty 15

## 2021-02-05 MED ORDER — DEXAMETHASONE SODIUM PHOSPHATE 100 MG/10ML IJ SOLN
10.0000 mg | Freq: Once | INTRAMUSCULAR | Status: AC
  Administered 2021-02-05: 10 mg via INTRAVENOUS
  Filled 2021-02-05: qty 10

## 2021-02-05 MED ORDER — PALONOSETRON HCL INJECTION 0.25 MG/5ML
0.2500 mg | Freq: Once | INTRAVENOUS | Status: AC
  Administered 2021-02-05: 0.25 mg via INTRAVENOUS

## 2021-02-05 MED ORDER — SODIUM CHLORIDE 0.9 % IV SOLN
2400.0000 mg/m2 | INTRAVENOUS | Status: DC
  Administered 2021-02-05: 4600 mg via INTRAVENOUS
  Filled 2021-02-05: qty 92

## 2021-02-05 MED ORDER — PALONOSETRON HCL INJECTION 0.25 MG/5ML
INTRAVENOUS | Status: AC
  Filled 2021-02-05: qty 5

## 2021-02-05 MED ORDER — DEXTROSE 5 % IV SOLN
Freq: Once | INTRAVENOUS | Status: AC
  Filled 2021-02-05: qty 250

## 2021-02-05 MED ORDER — LEUCOVORIN CALCIUM INJECTION 350 MG
400.0000 mg/m2 | Freq: Once | INTRAVENOUS | Status: AC
  Administered 2021-02-05: 764 mg via INTRAVENOUS
  Filled 2021-02-05: qty 38.2

## 2021-02-05 MED ORDER — OXALIPLATIN CHEMO INJECTION 100 MG/20ML
85.0000 mg/m2 | Freq: Once | INTRAVENOUS | Status: AC
  Administered 2021-02-05: 160 mg via INTRAVENOUS
  Filled 2021-02-05: qty 32

## 2021-02-05 MED ORDER — SODIUM CHLORIDE 0.9% FLUSH
10.0000 mL | Freq: Once | INTRAVENOUS | Status: AC
  Administered 2021-02-05: 10 mL via INTRAVENOUS
  Filled 2021-02-05: qty 10

## 2021-02-05 NOTE — Progress Notes (Signed)
[facility] OFFICE PROGRESS NOTE   Diagnosis: Gastric cancer  INTERVAL HISTORY:   [name] completed another cycle of FOLFOX on 01/22/2021.  No nausea/vomiting or diarrhea.  He had less cold sensitivity following this cycle of chemotherapy.  No other neuropathy symptoms.  He reports sores in the nose and mild nosebleeding.  He has a sore left lower tooth.  Objective:  Vital signs in last 24 hours:  Blood pressure 91/60, pulse 92, temperature 97.8 F (36.6 C), temperature source Tympanic, resp. rate 18, height _0  (1.778 m), weight 171 lb (77.6 kg), SpO2 98 %.    HEENT: Mild erythema/ulceration at the right nasal septum, oral cavity without thrush or ulcers.  Periodontal disease and a left posterior molar Resp: Lungs clear bilaterally Cardio: Regular rate and rhythm GI: No hepatosplenomegaly, no mass, nontender Vascular: No leg edema  Skin: Hyperpigmentation of the hands  Portacath/PICC-without erythema  Lab Results:  Lab Results  Component Value Date   WBC 3.3 (L) 02/05/2021   HGB 13.0 02/05/2021   HCT 38.9 (L) 02/05/2021   MCV 83.5 02/05/2021   PLT 144 (L) 02/05/2021   NEUTROABS 1.4 (L) 02/05/2021    CMP  Lab Results  Component Value Date   NA 136 02/05/2021   K 3.8 02/05/2021   CL 98 02/05/2021   CO2 30 02/05/2021   GLUCOSE 119 (H) 02/05/2021   BUN 31 (H) 02/05/2021   CREATININE 1.37 (H) 02/05/2021   CALCIUM 8.9 02/05/2021   PROT 6.8 02/05/2021   ALBUMIN 3.6 02/05/2021   AST 24 02/05/2021   ALT 25 02/05/2021   ALKPHOS 86 02/05/2021   BILITOT 0.8 02/05/2021   GFRNONAA >60 02/05/2021   GFRAA 55 (L) 09/21/2020    Lab Results  Component Value Date   CEA1 6.70 (H) 12/12/2020    Medications: I have reviewed the patient's current medications.   Assessment/Plan: 1. Gastric cancer  CT abdomen/pelvis 11/01/2020-masslike area measuring 6.3 x 5.6 x 5.6 cm in the antral prepyloric region of the stomach  Upper endoscopy 11/05/2020-patchy  white plaques in the distal esophagus; 3 nonbleeding cratered gastric ulcers with pigmented material in the gastric antrum largest 10 mm; segmental moderate inflammation characterized by congestion, erosions and erythema in the gastric antrum. Biopsy of stomach ulcer-intramucosal adenocarcinoma arising in a background of intestinal metaplasia, HER-2 negative  PET 11/2020-focal area of hypermetabolic activity at the gastric antrum, averages into the liver and small liver lesion adjacent to stomach not excluded, small chest lymph nodes with mild increase in metabolic activity-nonspecific, symmetric parotid and extraocular muscle activity-likely physiologic  Cycle 1 FOLFOX 12/12/2020  Cycle 2 FOLFOX 12/26/2020  Cycle 3 FOLFOX 01/08/2021  Cycle 4 FOLFOX 01/22/2021  Cycle 5 FOLFOX 02/05/2021-Udenyca 2. CHF, LVEF 20-25% 10/04/2020 3. CAD status post prior LAD stent 4. COPD 5. Chronic kidney disease 6. Cocaine usage 7. Port-A-Cath placement 12/06/2020, Interventional Radiology 8. Mild neutropenia secondary to chemotherapy-Udenyca added with cycle 5 FOLFOX     Disposition: [name] appears stable.  He is tolerating the FOLFOX well.  He will complete cycle 5 chemotherapy today.  He will be referred for a restaging CT prior to an office visit in 2 weeks.  He has mild neutropenia today.  We discussed the risk of infection.  He agrees to proceed with chemotherapy today with G-CSF support.  I reviewed potential toxicities associated with Udenyca including the chance of rash, bone pain, and splenic rupture.  He will be referred to dental medicine for evaluation of the  left tooth.  Betsy Coder, MD  02/05/2021  10:48 AM

## 2021-02-05 NOTE — Progress Notes (Signed)
Nutrition  Provided complimentary case of ensure enlive to patient.   Marinell Igarashi B. Zenia Resides, [place], [place] Registered Dietitian [phone] (mobile)

## 2021-02-05 NOTE — Progress Notes (Signed)
Per Dr. Benay Spice: OK to treat today w/ANC 1.4. Have added Udenyca to plan.

## 2021-02-05 NOTE — Patient Instructions (Signed)

## 2021-02-07 ENCOUNTER — Other Ambulatory Visit: Payer: Self-pay

## 2021-02-07 ENCOUNTER — Inpatient Hospital Stay: Payer: Medicaid Other

## 2021-02-07 DIAGNOSIS — Z5111 Encounter for antineoplastic chemotherapy: Secondary | ICD-10-CM | POA: Diagnosis not present

## 2021-02-07 DIAGNOSIS — N189 Chronic kidney disease, unspecified: Secondary | ICD-10-CM | POA: Diagnosis not present

## 2021-02-07 DIAGNOSIS — Z5189 Encounter for other specified aftercare: Secondary | ICD-10-CM | POA: Diagnosis not present

## 2021-02-07 DIAGNOSIS — C163 Malignant neoplasm of pyloric antrum: Secondary | ICD-10-CM

## 2021-02-07 DIAGNOSIS — Z452 Encounter for adjustment and management of vascular access device: Secondary | ICD-10-CM | POA: Diagnosis not present

## 2021-02-07 DIAGNOSIS — D701 Agranulocytosis secondary to cancer chemotherapy: Secondary | ICD-10-CM | POA: Diagnosis not present

## 2021-02-07 MED ORDER — PEGFILGRASTIM-CBQV 6 MG/0.6ML ~~LOC~~ SOSY
PREFILLED_SYRINGE | SUBCUTANEOUS | Status: AC
  Filled 2021-02-07: qty 0.6

## 2021-02-07 MED ORDER — PEGFILGRASTIM-CBQV 6 MG/0.6ML ~~LOC~~ SOSY
6.0000 mg | PREFILLED_SYRINGE | Freq: Once | SUBCUTANEOUS | Status: AC
  Administered 2021-02-07: 6 mg via SUBCUTANEOUS

## 2021-02-07 MED ORDER — HEPARIN SOD (PORK) LOCK FLUSH 100 UNIT/ML IV SOLN
500.0000 [IU] | Freq: Once | INTRAVENOUS | Status: AC | PRN
  Administered 2021-02-07: 500 [IU]
  Filled 2021-02-07: qty 5

## 2021-02-07 MED ORDER — SODIUM CHLORIDE 0.9% FLUSH
10.0000 mL | INTRAVENOUS | Status: DC | PRN
Start: 2021-02-07 — End: 2021-02-07
  Administered 2021-02-07: 10 mL
  Filled 2021-02-07: qty 10

## 2021-02-07 NOTE — Patient Instructions (Signed)
Pegfilgrastim injection What is this medicine? PEGFILGRASTIM (PEG fil gra stim) is a long-acting granulocyte colony-stimulating factor that stimulates the growth of neutrophils, a type of white blood cell important in the body's fight against infection. It is used to reduce the incidence of fever and infection in patients with certain types of cancer who are receiving chemotherapy that affects the bone marrow, and to increase survival after being exposed to high doses of radiation. This medicine may be used for other purposes; ask your health care provider or pharmacist if you have questions. COMMON BRAND NAME(S): Fulphila, Neulasta, Nyvepria, UDENYCA, Ziextenzo What should I tell my health care provider before I take this medicine? They need to know if you have any of these conditions:  kidney disease  latex allergy  ongoing radiation therapy  sickle cell disease  skin reactions to acrylic adhesives (On-Body Injector only)  an unusual or allergic reaction to pegfilgrastim, filgrastim, other medicines, foods, dyes, or preservatives  pregnant or trying to get pregnant  breast-feeding How should I use this medicine? This medicine is for injection under the skin. If you get this medicine at home, you will be taught how to prepare and give the pre-filled syringe or how to use the On-body Injector. Refer to the patient Instructions for Use for detailed instructions. Use exactly as directed. Tell your healthcare provider immediately if you suspect that the On-body Injector may not have performed as intended or if you suspect the use of the On-body Injector resulted in a missed or partial dose. It is important that you put your used needles and syringes in a special sharps container. Do not put them in a trash can. If you do not have a sharps container, call your pharmacist or healthcare provider to get one. Talk to your pediatrician regarding the use of this medicine in children. While this drug  may be prescribed for selected conditions, precautions do apply. Overdosage: If you think you have taken too much of this medicine contact a poison control center or emergency room at once. NOTE: This medicine is only for you. Do not share this medicine with others. What if I miss a dose? It is important not to miss your dose. Call your doctor or health care professional if you miss your dose. If you miss a dose due to an On-body Injector failure or leakage, a new dose should be administered as soon as possible using a single prefilled syringe for manual use. What may interact with this medicine? Interactions have not been studied. This list may not describe all possible interactions. Give your health care provider a list of all the medicines, herbs, non-prescription drugs, or dietary supplements you use. Also tell them if you smoke, drink alcohol, or use illegal drugs. Some items may interact with your medicine. What should I watch for while using this medicine? Your condition will be monitored carefully while you are receiving this medicine. You may need blood work done while you are taking this medicine. Talk to your health care provider about your risk of cancer. You may be more at risk for certain types of cancer if you take this medicine. If you are going to need a MRI, CT scan, or other procedure, tell your doctor that you are using this medicine (On-Body Injector only). What side effects may I notice from receiving this medicine? Side effects that you should report to your doctor or health care professional as soon as possible:  allergic reactions (skin rash, itching or hives, swelling of   the face, lips, or tongue)  back pain  dizziness  fever  pain, redness, or irritation at site where injected  pinpoint red spots on the skin  red or dark-brown urine  shortness of breath or breathing problems  stomach or side pain, or pain at the shoulder  swelling  tiredness  trouble  passing urine or change in the amount of urine  unusual bruising or bleeding Side effects that usually do not require medical attention (report to your doctor or health care professional if they continue or are bothersome):  bone pain  muscle pain This list may not describe all possible side effects. Call your doctor for medical advice about side effects. You may report side effects to FDA at 1-800-FDA-1088. Where should I keep my medicine? Keep out of the reach of children. If you are using this medicine at home, you will be instructed on how to store it. Throw away any unused medicine after the expiration date on the label. NOTE: This sheet is a summary. It may not cover all possible information. If you have questions about this medicine, talk to your doctor, pharmacist, or health care provider.  2021 Elsevier/Gold Standard (2020-01-05 13:20:51)  

## 2021-02-10 ENCOUNTER — Encounter (HOSPITAL_COMMUNITY): Payer: Self-pay

## 2021-02-10 ENCOUNTER — Other Ambulatory Visit: Payer: Self-pay

## 2021-02-10 ENCOUNTER — Ambulatory Visit (HOSPITAL_COMMUNITY)
Admission: RE | Admit: 2021-02-10 | Discharge: 2021-02-10 | Disposition: A | Discharge status: Home / Self Care | Payer: Medicaid Other | Source: Ambulatory Visit | Attending: Family Medicine | Admitting: Family Medicine

## 2021-02-10 VITALS — BP 118/76 | Wt 174.8 lb

## 2021-02-10 DIAGNOSIS — F17210 Nicotine dependence, cigarettes, uncomplicated: Secondary | ICD-10-CM | POA: Insufficient documentation

## 2021-02-10 DIAGNOSIS — J449 Chronic obstructive pulmonary disease, unspecified: Secondary | ICD-10-CM | POA: Diagnosis not present

## 2021-02-10 DIAGNOSIS — N183 Chronic kidney disease, stage 3 unspecified: Secondary | ICD-10-CM | POA: Diagnosis not present

## 2021-02-10 DIAGNOSIS — Z79899 Other long term (current) drug therapy: Secondary | ICD-10-CM | POA: Insufficient documentation

## 2021-02-10 DIAGNOSIS — I509 Heart failure, unspecified: Secondary | ICD-10-CM

## 2021-02-10 DIAGNOSIS — Z7984 Long term (current) use of oral hypoglycemic drugs: Secondary | ICD-10-CM | POA: Insufficient documentation

## 2021-02-10 DIAGNOSIS — K219 Gastro-esophageal reflux disease without esophagitis: Secondary | ICD-10-CM | POA: Insufficient documentation

## 2021-02-10 DIAGNOSIS — I5022 Chronic systolic (congestive) heart failure: Secondary | ICD-10-CM | POA: Diagnosis not present

## 2021-02-10 DIAGNOSIS — E785 Hyperlipidemia, unspecified: Secondary | ICD-10-CM | POA: Insufficient documentation

## 2021-02-10 DIAGNOSIS — Z8249 Family history of ischemic heart disease and other diseases of the circulatory system: Secondary | ICD-10-CM | POA: Diagnosis not present

## 2021-02-10 DIAGNOSIS — I2510 Atherosclerotic heart disease of native coronary artery without angina pectoris: Secondary | ICD-10-CM | POA: Insufficient documentation

## 2021-02-10 DIAGNOSIS — Z8616 Personal history of COVID-19: Secondary | ICD-10-CM | POA: Insufficient documentation

## 2021-02-10 DIAGNOSIS — N1831 Chronic kidney disease, stage 3a: Secondary | ICD-10-CM

## 2021-02-10 DIAGNOSIS — I252 Old myocardial infarction: Secondary | ICD-10-CM | POA: Insufficient documentation

## 2021-02-10 DIAGNOSIS — F1410 Cocaine abuse, uncomplicated: Secondary | ICD-10-CM | POA: Diagnosis not present

## 2021-02-10 DIAGNOSIS — Z955 Presence of coronary angioplasty implant and graft: Secondary | ICD-10-CM | POA: Insufficient documentation

## 2021-02-10 DIAGNOSIS — Z720 Tobacco use: Secondary | ICD-10-CM

## 2021-02-10 DIAGNOSIS — Z9861 Coronary angioplasty status: Secondary | ICD-10-CM

## 2021-02-10 DIAGNOSIS — I340 Nonrheumatic mitral (valve) insufficiency: Secondary | ICD-10-CM

## 2021-02-10 DIAGNOSIS — Z7982 Long term (current) use of aspirin: Secondary | ICD-10-CM | POA: Insufficient documentation

## 2021-02-10 DIAGNOSIS — I255 Ischemic cardiomyopathy: Secondary | ICD-10-CM | POA: Insufficient documentation

## 2021-02-10 DIAGNOSIS — C169 Malignant neoplasm of stomach, unspecified: Secondary | ICD-10-CM

## 2021-02-10 LAB — BASIC METABOLIC PANEL
Anion gap: 8 (ref 5–15)
BUN: 23 mg/dL — ABNORMAL HIGH (ref 6–20)
CO2: 27 mmol/L (ref 22–32)
Calcium: 8.8 mg/dL — ABNORMAL LOW (ref 8.9–10.3)
Chloride: 100 mmol/L (ref 98–111)
Creatinine, Ser: 1.22 mg/dL (ref 0.61–1.24)
GFR, Estimated: >60 mL/min (ref >60)
Glucose, Bld: 102 mg/dL — ABNORMAL HIGH (ref 70–99)
Potassium: 4 mmol/L (ref 3.5–5.1)
Sodium: 135 mmol/L (ref 135–145)

## 2021-02-10 LAB — DIGOXIN LEVEL: Digoxin Level: 0.9 ng/mL (ref 0.8–2.0)

## 2021-02-10 MED ORDER — TORSEMIDE 20 MG PO TABS: ORAL_TABLET | ORAL | 2 refills | Status: DC

## 2021-02-10 MED ORDER — SPIRONOLACTONE 25 MG PO TABS: 12.5000 mg | ORAL_TABLET | Freq: Every day | ORAL | 3 refills | Status: DC

## 2021-02-10 NOTE — Patient Instructions (Signed)
INCREASE Torsemide to 80 mg (4 tabs) twice a day for 2 days, then resume normal dose of 80 mg  In the AM and 60 mg in the PM  START Spironolactone 12.5 mg, one half tab daily  Labs today We will only contact you if something comes back abnormal or we need to make some changes. Otherwise no news is good news!  Labs needed in 7-10 days  Your physician recommends that you schedule a follow-up appointment in: 2 weeks  in the Advanced Practitioners (PA/NP) Clinic   Do the following things EVERYDAY: 1) Weigh yourself in the morning before breakfast. Write it down and keep it in a log. 2) Take your medicines as prescribed 3) Eat low salt foods-Limit salt (sodium) to 2000 mg per day.  4) Stay as active as you can everyday 5) Limit all fluids for the day to less than 2 liters  At the [facility], you and your health needs are our priority. As part of our continuing mission to provide you with exceptional heart care, we have created designated Provider Care Teams. These Care Teams include your primary Cardiologist (physician) and Advanced Practice Providers (APPs- Physician Assistants and Nurse Practitioners) who all work together to provide you with the care you need, when you need it.   You may see any of the following providers on your designated Care Team at your next follow up: Marland Kitchen Dr Glori Bickers . Dr Loralie Champagne . Darrick Grinder, NP . Lyda Jester, [place] . Audry Riles, PharmD   Please be sure to bring in all your medications bottles to every appointment.    If you have any questions or concerns before your next appointment please send [place] a message through [place] or call our office at [phone].    TO LEAVE A MESSAGE FOR THE NURSE SELECT OPTION 2, PLEASE LEAVE A MESSAGE INCLUDING: . YOUR NAME . DATE OF BIRTH . CALL BACK NUMBER . REASON FOR CALL**this is important as we prioritize the call backs  YOU WILL RECEIVE A CALL BACK THE SAME DAY  AS LONG AS YOU CALL BEFORE 4:00 PM

## 2021-02-10 NOTE — Progress Notes (Signed)
ReDS Vest / Clip - 02/10/21 0900      ReDS Vest / Clip   Station Marker C    Ruler Value 32    ReDS Value Range Moderate volume overload    ReDS Actual Value 36

## 2021-02-10 NOTE — Progress Notes (Signed)
Advanced Heart Failure Clinic Note   Referring Physician: PCP: [name], NP AHFC: Dr. Aundra Dubin   Reason for Visit:  Chronic Systolic Heart Failure   HPI: 55 y.o. 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 Also w/ h/o poor compliance. Previously referred to paramedicine but not fully cooperative. Diagnosed w/ COVID 09/19/20.   Admitted 10/21 w/ acute on chronic systolic heart failure w/ low output and AKI requiring milrinone to help w/ diuresis. He was cocaine positive. Diuresed and weaned off milrinone w/ stable co-ox at 65%. Scr had improved from 2.1>>1.7. D/c wt 159 lb. Unfortunately, his wife was also hospitalized for COVID and died while he was admitted.   Readmitted again 11/21 with marked volume overload and low output heart failure, cocaine+ again. He complained of abdominal pain, initially suspected to be 2/2 low output HF but CT showeda mass-like area in the antral pre-pyloric region of the stomach. EGD with complicated distal stomach ulcers. Pathology confirmed gastric cancer, intramucosal adenocarcinoma. Seen by oncology and not felt to be a candidate for surgery given other comorbidities.   He follow with Dr. Benay Spice and has been getting FOLFOX chemotherapy.   He presented to clinic 1/22 for f/u of CHF. He was doing well up until about 3-4 days ago.  He has been walking 1-1.5 miles most days of the week without dyspnea.  He had been out of torsemide for 4 days, weight up.  He noted orthopnea for the first time the previous night. Still smoking about 0.5 ppd.  No chest pain. He was instructed to take torsemide 80 mg bid x 4 days then 80 qam/60 qpm as well as Start Entresto 24/26 bid.  Today he returns for HF follow up. He ran out of torsemide on  Friday. Took 2 days of 80 mg bid, instead of 4. Overall feeling fine. Feels breathing is a little more short today. Denies increasing CP, dizziness, edema, or PND/Orthopnea. Appetite ok. No fever or chills. Weight at home ~177 pounds. Taking all medications. Smoking 1/2 ppd cigarettes. Has not used cocaine in 1 month.  REDS clip 36% ECG (personally reviewed): ST w/ PVCs 104 bpm, no significant changes since last ECG.  Labs (1/22): K 3.3, creatinine 1.29, hgb 13.8 Labs (2/22): K 3.8, creatinine 1.37, hgb 13.0  Review of systems complete and found to be negative unless listed in HPI.    PMH: 1. CAD: STEMI 2015 with DES to LAD.  - Cath in 10/19 showed 90% ostial OM1 and 70% PLV, no interventional target. 2. Hyperlipidemia 3. Cocaine abuse.  4. COPD: Ongoing smoking.  5. GERD 6. Gastric cancer: Intramucosal adenocarcinoma.  - FOLFOX chemotherapy 7. Chronic systolic CHF:  Mixed ischemic/nonischemic (cocaine) cardiomyopathy.  Thought to be end stage, has had low output requiring milrinone in the hospital but not candidate for LVAD, transplant, or home milrinone given ongoing cocaine abuse.  - Echo (10/21): EF 20-25%, global HK with moderate dilation, mild LVH, RV low normal systolic function, PASP 57 mmHg, moderate-severe MR, moderate-severe TR.  8. COVID-19 infection: 9/21.    Current Outpatient Medications  Medication Sig Dispense Refill  . albuterol (VENTOLIN HFA) 108 (90 Base) MCG/ACT inhaler Inhale 2 puffs into the lungs every 4 (four) hours as needed for wheezing or shortness of breath. 6.7 g 1  .  aspirin 81 MG EC tablet Take 1 tablet (81 mg total) by mouth daily. 30 tablet 11  . atorvastatin (LIPITOR) 80 MG tablet Take 1 tablet (80 mg total) by mouth daily. 30 tablet 11  . digoxin (LANOXIN) 0.125 MG tablet Take 1 tablet (0.125 mg total) by mouth daily. 30 tablet 11  . empagliflozin (JARDIANCE) 10 MG TABS tablet Take 1 tablet (10 mg total) by mouth daily. 30 tablet 11  .  isosorbide-hydrALAZINE (BIDIL) 20-37.5 MG tablet Take 1 tablet by mouth 3 (three) times daily. 90 tablet 11  . ivabradine (CORLANOR) 5 MG TABS tablet Take 1 tablet (5 mg total) by mouth 2 (two) times daily with a meal. 60 tablet 11  . lidocaine-prilocaine (EMLA) cream APPLY TO PORT TOPICALLY 1 TO 2 HOURS PRIOR TO STICK AND COVER WITH PLASTIC WRAP. 30 g 1  . Multiple Vitamin (MULTIVITAMIN ADULT PO) Take 1 tablet by mouth daily.    Marland Kitchen omeprazole (PRILOSEC) 20 MG capsule TAKE ONE CAPSULE BY MOUTH ONCE DAILY (MORNING) 30 capsule 1  . pantoprazole (PROTONIX) 40 MG tablet Take 1 tablet (40 mg total) by mouth 2 (two) times daily. 60 tablet 2  . potassium chloride SA (KLOR-CON) 20 MEQ tablet Take 1 tablet (20 mEq total) by mouth daily. 30 tablet 11  . torsemide (DEMADEX) 20 MG tablet Take 4 tablets (80 mg total) by mouth every morning AND 2 tablets (40 mg total) every evening. 180 tablet 11  . ondansetron (ZOFRAN) 8 MG tablet TAKE 1 TABLET (8 MG TOTAL) BY MOUTH EVERY 8 (EIGHT) HOURS AS NEEDED FOR NAUSEA OR VOMITING.(Patient not taking: No sig reported) 30 tablet 1  . prochlorperazine (COMPAZINE) 10 MG tablet TAKE 1 TABLET (10 MG TOTAL) BY MOUTH EVERY 6 (SIX) HOURS AS NEEDED FOR NAUSEA. (Patient not taking: No sig reported) 60 tablet 1  . spironolactone (ALDACTONE) 25 MG tablet Take 1 tablet (25 mg total) by mouth daily. (Patient not taking: No sig reported) 30 tablet 11   No current facility-administered medications for this encounter.    No Known Allergies    Social History   Socioeconomic History  . Marital status: Single    Spouse name: Not on file  . Number of children: 2  . Years of education: 19  . Highest education level: 12th grade  Occupational History  . Occupation: Landscaping  Tobacco Use  . Smoking status: Current Every Day Smoker    Packs/day: 0.25    Years: 30.00    Pack years: 7.50    Types: Cigarettes  . Smokeless tobacco: Never Used  . Tobacco comment: .5 PK PER DAY   Vaping Use  . Vaping Use: Never used  Substance and Sexual Activity  . Alcohol use: No    Comment: Patient denies abuse, states social drinker  . Drug use: Yes    Types: Cocaine, "Crack" cocaine    Comment: heroin  . Sexual activity: Yes  Other Topics Concern  . Not on file  Social History Narrative   Lives in [place] with his sister and girlfriend. He works Aeronautical engineer work.    Social Determinants of Health   Financial Resource Strain: Not on file  Food Insecurity: Food Insecurity Present  . Worried About Charity fundraiser in the Last Year: Sometimes true  . Ran Out of Food in the Last Year: Never true  Transportation Needs: No Transportation Needs  . Lack of Transportation (Medical): No  . Lack of Transportation (Non-Medical): No  Physical Activity: Not  on file  Stress: Not on file  Social Connections: Not on file  Intimate Partner Violence: Not on file     Family History  Problem Relation Age of Onset  . Cirrhosis Father   . Heart attack Mother   . Heart attack Sister   . Heart failure Sister   . Heart attack Brother     Vitals:   02/10/21 0850  BP: 118/76  SpO2: 96%  Weight: 79.3 kg (174 lb 12.8 oz)   Wt Readings from Last 3 Encounters:  02/10/21 79.3 kg (174 lb 12.8 oz)  02/05/21 77.6 kg (171 lb)  01/22/21 76.5 kg (168 lb 11.2 oz)   PHYSICAL EXAM:  ReDs Clip 36%  General:  NAD. No resp difficulty HEENT: Normal Neck: Supple. JVP 6-7. Carotids 2+ bilat; no bruits. No lymphadenopathy or thryomegaly appreciated. Cor: PMI nondisplaced. Regular rate & rhythm. No rubs, gallops, II/VI HSM LLSB. Lungs: Clear, diminished in bases, port in left chest Abdomen: Soft, nontender, nondistended. No hepatosplenomegaly. No bruits or masses. Good bowel sounds. Extremities: No cyanosis, clubbing, rash, edema Neuro: alert & oriented x 3, cranial nerves grossly intact. Moves all 4 extremities w/o difficulty. Affect pleasant.  ASSESSMENT & PLAN: 1.  Chronic Systolic HF:  Anterior MI in 2015, DES to LAD.  Echo in 2015 with EF 40-45%, thought to be ischemic cardiomyopathy. Cath in 10/19 showed 90% ostial OM1 and 70% PLV, no interventional target. 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 Last Echo 10/21 showed EF 20-25% with mild RV dysfunction.He has a strong family history of CHF/cardiomyopathy, so may be a component of familial cardiomyopathy.Cocaine likely plays a role in his cardiomyopathy.Multiple re-admits forcardiogenic shockin the last 6 months requiring milrinone. Unfortunately, not a candidate for advanced therapies or home milrinone with active use of cocaine and gastric cancer. He has end-stage HF.  On exam today, he is slightly volume overloaded and weight is up.  He has been out of torsemide for 3 days. NYHA class II-III symptoms.  REDS clip mildly elevated at 36%. - Given social situation, gastric cancer, and ongoing substance abuse,  he is not a candidate for any advanced therapies including home milrinone - Increase torsemide to 80 mg bid x 2 days, then back to 80 mg qam/60 mg qpm.   - Start spiro 12.5 mg daily. (may need to back down on KCl pending BMET). BMET today and in 1 week.  - Continue Entresto 24/26 mg bid (BP and renal function stable).  - Continue digoxin 0.125, check level today.  - Continue Jardiance 10 mg daily.  - Continue Bidil 1 tab tid.  - Continue ivabradine 5 mg bid.  ST on ECG today, consider increasing at next visit if HR still elevated. - No  blocker w/ h/o low output and cocaine use 2. CAD: H/o anterior STEMI with DES to LAD in 7/15. Cath in 10/19 with 90% ostial OM1 and 70% PLV, no interventional target. No chest pain.  -ContinueASA 81 and atorvastatin 80 mg daily.  - Needs to refrain from using cocaine - Lipids (1/22) ok.  - No chest pain. 3. Cocaine abuse:Has been + cocaine each hospital visit.  Encouraged abstinence. Last used 1 month ago. 4. Active smoker: Urged  cessation.  5. CKD: Stage 3.  BMET today.  6. Mitral Regurgitation: Moderate to severe on recent echo, suspect functional MR from dilated CMP.  - Not great Mitraclip candidate with cocaine abuse and compliance issues.  7. Gastric Cancer: Intramucosal adenocarcinoma.  - FOLFOX chemo ongoing.  Follow labs closely with addition of spiro and F/u in 2 wks with APP to reassess volume status.   [place], FNP-BC 02/10/21

## 2021-02-10 NOTE — Addendum Note (Signed)
Encounter addended by: Kerry Dory, CMA on: 02/10/2021 9:43 AM  Actions taken: Pharmacy for encounter modified, Order list changed

## 2021-02-11 ENCOUNTER — Telehealth (HOSPITAL_COMMUNITY): Payer: Self-pay

## 2021-02-11 NOTE — Telephone Encounter (Signed)
Made in error

## 2021-02-15 ENCOUNTER — Other Ambulatory Visit: Payer: Self-pay | Admitting: Oncology

## 2021-02-17 ENCOUNTER — Ambulatory Visit (HOSPITAL_COMMUNITY)
Admission: RE | Admit: 2021-02-17 | Discharge: 2021-02-17 | Disposition: A | Discharge status: Home / Self Care | Payer: Medicaid Other | Source: Ambulatory Visit | Attending: Cardiology | Admitting: Cardiology

## 2021-02-17 ENCOUNTER — Other Ambulatory Visit: Payer: Self-pay

## 2021-02-17 DIAGNOSIS — I509 Heart failure, unspecified: Secondary | ICD-10-CM | POA: Insufficient documentation

## 2021-02-17 LAB — BASIC METABOLIC PANEL
Anion gap: 11 (ref 5–15)
BUN: 25 mg/dL — ABNORMAL HIGH (ref 6–20)
CO2: 29 mmol/L (ref 22–32)
Calcium: 9.6 mg/dL (ref 8.9–10.3)
Chloride: 94 mmol/L — ABNORMAL LOW (ref 98–111)
Creatinine, Ser: 1.52 mg/dL — ABNORMAL HIGH (ref 0.61–1.24)
GFR, Estimated: 54 mL/min — ABNORMAL LOW (ref >60)
Glucose, Bld: 109 mg/dL — ABNORMAL HIGH (ref 70–99)
Potassium: 4.1 mmol/L (ref 3.5–5.1)
Sodium: 134 mmol/L — ABNORMAL LOW (ref 135–145)

## 2021-02-19 ENCOUNTER — Inpatient Hospital Stay (HOSPITAL_BASED_OUTPATIENT_CLINIC_OR_DEPARTMENT_OTHER): Payer: Medicaid Other | Admitting: Oncology

## 2021-02-19 ENCOUNTER — Other Ambulatory Visit: Payer: Self-pay

## 2021-02-19 ENCOUNTER — Inpatient Hospital Stay: Payer: Medicaid Other

## 2021-02-19 ENCOUNTER — Encounter: Payer: Self-pay | Admitting: *Deleted

## 2021-02-19 VITALS — BP 91/55 | HR 86 | Temp 97.9°F | Resp 17 | Ht 70.0 in | Wt 169.1 lb

## 2021-02-19 DIAGNOSIS — D701 Agranulocytosis secondary to cancer chemotherapy: Secondary | ICD-10-CM | POA: Diagnosis not present

## 2021-02-19 DIAGNOSIS — Z452 Encounter for adjustment and management of vascular access device: Secondary | ICD-10-CM | POA: Diagnosis not present

## 2021-02-19 DIAGNOSIS — C163 Malignant neoplasm of pyloric antrum: Secondary | ICD-10-CM

## 2021-02-19 DIAGNOSIS — Z5111 Encounter for antineoplastic chemotherapy: Secondary | ICD-10-CM | POA: Diagnosis not present

## 2021-02-19 DIAGNOSIS — Z5189 Encounter for other specified aftercare: Secondary | ICD-10-CM | POA: Diagnosis not present

## 2021-02-19 DIAGNOSIS — N189 Chronic kidney disease, unspecified: Secondary | ICD-10-CM | POA: Diagnosis not present

## 2021-02-19 LAB — CBC WITH DIFFERENTIAL (CANCER CENTER ONLY)
Abs Immature Granulocytes: 0.48 10*3/uL — ABNORMAL HIGH (ref 0.00–0.07)
Basophils Absolute: 0.1 10*3/uL (ref 0.0–0.1)
Basophils Relative: 1 %
Eosinophils Absolute: 0.1 10*3/uL (ref 0.0–0.5)
Eosinophils Relative: 1 %
HCT: 39.5 % (ref 39.0–52.0)
Hemoglobin: 13.1 g/dL (ref 13.0–17.0)
Immature Granulocytes: 4 %
Lymphocytes Relative: 11 %
Lymphs Abs: 1.2 10*3/uL (ref 0.7–4.0)
MCH: 28.2 pg (ref 26.0–34.0)
MCHC: 33.2 g/dL (ref 30.0–36.0)
MCV: 84.9 fL (ref 80.0–100.0)
Monocytes Absolute: 1.6 10*3/uL — ABNORMAL HIGH (ref 0.1–1.0)
Monocytes Relative: 14 %
Neutro Abs: 7.6 10*3/uL (ref 1.7–7.7)
Neutrophils Relative %: 69 %
Platelet Count: 137 10*3/uL — ABNORMAL LOW (ref 150–400)
RBC: 4.65 MIL/uL (ref 4.22–5.81)
RDW: 21.3 % — ABNORMAL HIGH (ref 11.5–15.5)
WBC Count: 11.1 10*3/uL — ABNORMAL HIGH (ref 4.0–10.5)
nRBC: 0 % (ref 0.0–0.2)

## 2021-02-19 LAB — CMP (CANCER CENTER ONLY)
ALT: 19 U/L (ref 0–44)
AST: 22 U/L (ref 15–41)
Albumin: 3.6 g/dL (ref 3.5–5.0)
Alkaline Phosphatase: 131 U/L — ABNORMAL HIGH (ref 38–126)
Anion gap: 11 (ref 5–15)
BUN: 29 mg/dL — ABNORMAL HIGH (ref 6–20)
CO2: 30 mmol/L (ref 22–32)
Calcium: 8.7 mg/dL — ABNORMAL LOW (ref 8.9–10.3)
Chloride: 96 mmol/L — ABNORMAL LOW (ref 98–111)
Creatinine: 1.48 mg/dL — ABNORMAL HIGH (ref 0.61–1.24)
GFR, Estimated: 56 mL/min — ABNORMAL LOW (ref >60)
Glucose, Bld: 108 mg/dL — ABNORMAL HIGH (ref 70–99)
Potassium: 4 mmol/L (ref 3.5–5.1)
Sodium: 137 mmol/L (ref 135–145)
Total Bilirubin: 0.7 mg/dL (ref 0.3–1.2)
Total Protein: 7 g/dL (ref 6.5–8.1)

## 2021-02-19 LAB — CEA (IN HOUSE-CHCC): CEA (CHCC-In House): 6.61 ng/mL — ABNORMAL HIGH (ref 0.00–5.00)

## 2021-02-19 MED ORDER — PALONOSETRON HCL INJECTION 0.25 MG/5ML
INTRAVENOUS | Status: AC
  Filled 2021-02-19: qty 5

## 2021-02-19 MED ORDER — HEPARIN SOD (PORK) LOCK FLUSH 100 UNIT/ML IV SOLN
500.0000 [IU] | Freq: Once | INTRAVENOUS | Status: DC | PRN
  Filled 2021-02-19: qty 5

## 2021-02-19 MED ORDER — SODIUM CHLORIDE 0.9 % IV SOLN
10.0000 mg | Freq: Once | INTRAVENOUS | Status: AC
  Administered 2021-02-19: 10 mg via INTRAVENOUS
  Filled 2021-02-19: qty 10

## 2021-02-19 MED ORDER — DEXTROSE 5 % IV SOLN
Freq: Once | INTRAVENOUS | Status: AC
  Filled 2021-02-19: qty 250

## 2021-02-19 MED ORDER — SODIUM CHLORIDE 0.9 % IV SOLN
2400.0000 mg/m2 | INTRAVENOUS | Status: DC
  Administered 2021-02-19: 4600 mg via INTRAVENOUS
  Filled 2021-02-19: qty 92

## 2021-02-19 MED ORDER — LEUCOVORIN CALCIUM INJECTION 350 MG
400.0000 mg/m2 | Freq: Once | INTRAVENOUS | Status: AC
  Administered 2021-02-19: 764 mg via INTRAVENOUS
  Filled 2021-02-19: qty 38.2

## 2021-02-19 MED ORDER — FLUOROURACIL CHEMO INJECTION 2.5 GM/50ML
400.0000 mg/m2 | Freq: Once | INTRAVENOUS | Status: AC
Start: 2021-02-19 — End: 2021-02-19
  Administered 2021-02-19: 750 mg via INTRAVENOUS
  Filled 2021-02-19: qty 15

## 2021-02-19 MED ORDER — OXALIPLATIN CHEMO INJECTION 100 MG/20ML
85.0000 mg/m2 | Freq: Once | INTRAVENOUS | Status: AC
  Administered 2021-02-19: 160 mg via INTRAVENOUS
  Filled 2021-02-19: qty 32

## 2021-02-19 MED ORDER — PALONOSETRON HCL INJECTION 0.25 MG/5ML
0.2500 mg | Freq: Once | INTRAVENOUS | Status: AC
  Administered 2021-02-19: 0.25 mg via INTRAVENOUS

## 2021-02-19 MED ORDER — SODIUM CHLORIDE 0.9% FLUSH
10.0000 mL | INTRAVENOUS | Status: DC | PRN
  Filled 2021-02-19: qty 10

## 2021-02-19 NOTE — Progress Notes (Signed)
Nutrition  Complimentary case of ensure plus given to patient today.   Octavious Zidek B. Markeise Mathews, RD, LDN Registered Dietitian [phone] (mobile)   

## 2021-02-19 NOTE — Progress Notes (Signed)
[facility] OFFICE PROGRESS NOTE   Diagnosis: Gastric cancer  INTERVAL HISTORY:   Tyler Wong completed another cycle of FOLFOX on 02/05/2021.  No nausea/vomiting or diarrhea.  He continues to have discomfort at the left lower molar.  He has not been scheduled to see dental medicine.  He reports cold sensitivity lasting approximately 8 days following chemotherapy.  No other neuropathy symptoms.  He feels well.  No pain.  Good appetite.  Objective:  Vital signs in last 24 hours:  Blood pressure (!) 91/55, pulse 86, temperature 97.9 F (36.6 C), temperature source Tympanic, resp. rate 17, height _0  (1.778 m), weight 169 lb 1.6 oz (76.7 kg), SpO2 100 %.    HEENT: No thrush or ulcers Resp: Lungs clear bilaterally Cardio: Regular rate and rhythm GI: No mass, nontender, no hepatomegaly Vascular: No leg edema Neuro: Mild loss of vibratory sense at the fingertips bilaterally Skin: Hyperpigmentation, dryness, and skin thickening of the hands  Portacath/PICC-without erythema  Lab Results:  Lab Results  Component Value Date   WBC 11.1 (H) 02/19/2021   HGB 13.1 02/19/2021   HCT 39.5 02/19/2021   MCV 84.9 02/19/2021   PLT 137 (L) 02/19/2021   NEUTROABS 7.6 02/19/2021    CMP  Lab Results  Component Value Date   NA 134 (L) 02/17/2021   K 4.1 02/17/2021   CL 94 (L) 02/17/2021   CO2 29 02/17/2021   GLUCOSE 109 (H) 02/17/2021   BUN 25 (H) 02/17/2021   CREATININE 1.52 (H) 02/17/2021   CALCIUM 9.6 02/17/2021   PROT 6.8 02/05/2021   ALBUMIN 3.6 02/05/2021   AST 24 02/05/2021   ALT 25 02/05/2021   ALKPHOS 86 02/05/2021   BILITOT 0.8 02/05/2021   GFRNONAA 54 (L) 02/17/2021   GFRAA 55 (L) 09/21/2020    Lab Results  Component Value Date   CEA1 6.70 (H) 12/12/2020    Medications: I have reviewed the patient's current medications.   Assessment/Plan: 1. Gastric cancer  CT abdomen/pelvis 11/01/2020-masslike area measuring 6.3 x 5.6 x 5.6 cm in the antral  prepyloric region of the stomach  Upper endoscopy 11/05/2020-patchy white plaques in the distal esophagus; 3 nonbleeding cratered gastric ulcers with pigmented material in the gastric antrum largest 10 mm; segmental moderate inflammation characterized by congestion, erosions and erythema in the gastric antrum. Biopsy of stomach ulcer-intramucosal adenocarcinoma arising in a background of intestinal metaplasia, HER-2 negative  PET 11/2020-focal area of hypermetabolic activity at the gastric antrum, averages into the liver and small liver lesion adjacent to stomach not excluded, small chest lymph nodes with mild increase in metabolic activity-nonspecific, symmetric parotid and extraocular muscle activity-likely physiologic  Cycle 1 FOLFOX 12/12/2020  Cycle 2 FOLFOX 12/26/2020  Cycle 3 FOLFOX 01/08/2021  Cycle 4 FOLFOX 01/22/2021  Cycle 5 FOLFOX 02/05/2021-Udenyca  Cycle 6 FOLFOX 02/19/2021-Udenyca 2. CHF, LVEF 20-25% 10/04/2020 3. CAD status post prior LAD stent 4. COPD 5. Chronic kidney disease 6. Cocaine usage 7. Port-A-Cath placement 12/06/2020, Interventional Radiology 8. Mild neutropenia secondary to chemotherapy-Udenyca added with cycle 5 FOLFOX   Disposition: Tyler Wong has completed 5 cycles of FOLFOX.  He has tolerated the chemotherapy well to date.  He was supposed to have a restaging CT earlier today, but this was scheduled for tomorrow.  He will complete cycle 6 FOLFOX today.  He will undergo a restaging CT within the next few days.  Tyler Wong will return for an office visit in 2 weeks.  Betsy Coder, MD  02/19/2021  10:50  AM   

## 2021-02-19 NOTE — Progress Notes (Signed)
Faxed referral order and last office note to [facility] department at [phone].

## 2021-02-19 NOTE — Patient Instructions (Signed)
[facility] Discharge Instructions for Patients Receiving Chemotherapy  Today you received the following chemotherapy agents: Oxaliplatin, lecuovroin, 5FU To help prevent nausea and vomiting after your treatment, we encourage you to take your nausea medication as directed   If you develop nausea and vomiting that is not controlled by your nausea medication, call the clinic.   BELOW ARE SYMPTOMS THAT SHOULD BE REPORTED IMMEDIATELY:  *FEVER GREATER THAN 100.5 F  *CHILLS WITH OR WITHOUT FEVER  NAUSEA AND VOMITING THAT IS NOT CONTROLLED WITH YOUR NAUSEA MEDICATION  *UNUSUAL SHORTNESS OF BREATH  *UNUSUAL BRUISING OR BLEEDING  TENDERNESS IN MOUTH AND THROAT WITH OR WITHOUT PRESENCE OF ULCERS  *URINARY PROBLEMS  *BOWEL PROBLEMS  UNUSUAL RASH Items with * indicate a potential emergency and should be followed up as soon as possible.  Feel free to call the clinic should you have any questions or concerns. The clinic phone number is [phone].  Please show the [facility] at check-in to the Emergency Department and triage nurse.

## 2021-02-20 ENCOUNTER — Telehealth (HOSPITAL_COMMUNITY): Payer: Self-pay | Admitting: Pharmacy Technician

## 2021-02-20 ENCOUNTER — Ambulatory Visit (HOSPITAL_COMMUNITY)
Admission: RE | Admit: 2021-02-20 | Discharge: 2021-02-20 | Disposition: A | Discharge status: Home / Self Care | Payer: Medicaid Other | Source: Ambulatory Visit | Attending: Oncology | Admitting: Oncology

## 2021-02-20 ENCOUNTER — Telehealth: Payer: Self-pay | Admitting: Oncology

## 2021-02-20 DIAGNOSIS — C163 Malignant neoplasm of pyloric antrum: Secondary | ICD-10-CM

## 2021-02-20 DIAGNOSIS — C169 Malignant neoplasm of stomach, unspecified: Secondary | ICD-10-CM | POA: Diagnosis not present

## 2021-02-20 DIAGNOSIS — N3289 Other specified disorders of bladder: Secondary | ICD-10-CM | POA: Diagnosis not present

## 2021-02-20 NOTE — Telephone Encounter (Signed)
Opened in error

## 2021-02-20 NOTE — Telephone Encounter (Signed)
Advanced Heart Failure Patient Advocate Encounter  Prior Authorization for Delene Loll has been submitted and approved.    PA# [id number] Effective dates: 02/20/21 through 02/20/22  Charlann Boxer, CPhT

## 2021-02-20 NOTE — Telephone Encounter (Signed)
Scheduled appointments per 2/23 los. Spoke patient to who is aware of appointments dates and times.

## 2021-02-21 ENCOUNTER — Other Ambulatory Visit: Payer: Self-pay

## 2021-02-21 ENCOUNTER — Inpatient Hospital Stay: Payer: Medicaid Other

## 2021-02-21 VITALS — BP 105/73 | HR 54 | Temp 98.6°F | Resp 20

## 2021-02-21 DIAGNOSIS — Z452 Encounter for adjustment and management of vascular access device: Secondary | ICD-10-CM | POA: Diagnosis not present

## 2021-02-21 DIAGNOSIS — C163 Malignant neoplasm of pyloric antrum: Secondary | ICD-10-CM

## 2021-02-21 DIAGNOSIS — Z5189 Encounter for other specified aftercare: Secondary | ICD-10-CM | POA: Diagnosis not present

## 2021-02-21 DIAGNOSIS — N189 Chronic kidney disease, unspecified: Secondary | ICD-10-CM | POA: Diagnosis not present

## 2021-02-21 DIAGNOSIS — D701 Agranulocytosis secondary to cancer chemotherapy: Secondary | ICD-10-CM | POA: Diagnosis not present

## 2021-02-21 DIAGNOSIS — Z5111 Encounter for antineoplastic chemotherapy: Secondary | ICD-10-CM | POA: Diagnosis not present

## 2021-02-21 MED ORDER — PEGFILGRASTIM-CBQV 6 MG/0.6ML ~~LOC~~ SOSY
6.0000 mg | PREFILLED_SYRINGE | Freq: Once | SUBCUTANEOUS | Status: AC
  Administered 2021-02-21: 6 mg via SUBCUTANEOUS

## 2021-02-21 MED ORDER — HEPARIN SOD (PORK) LOCK FLUSH 100 UNIT/ML IV SOLN
500.0000 [IU] | Freq: Once | INTRAVENOUS | Status: AC | PRN
  Administered 2021-02-21: 500 [IU]
  Filled 2021-02-21: qty 5

## 2021-02-21 MED ORDER — SODIUM CHLORIDE 0.9% FLUSH
10.0000 mL | INTRAVENOUS | Status: DC | PRN
  Administered 2021-02-21: 10 mL
  Filled 2021-02-21: qty 10

## 2021-02-21 MED ORDER — PEGFILGRASTIM-CBQV 6 MG/0.6ML ~~LOC~~ SOSY
PREFILLED_SYRINGE | SUBCUTANEOUS | Status: AC
  Filled 2021-02-21: qty 0.6

## 2021-02-21 NOTE — Progress Notes (Incomplete)
Advanced Heart Failure Clinic Note   Referring Physician: PCP: [name], NP AHFC: Dr. Aundra Dubin   Reason for Visit:  Chronic Systolic Heart Failure   HPI: 55 y.o. 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 Also w/ h/o poor compliance. Previously referred to paramedicine but not fully cooperative. Diagnosed w/ COVID 09/19/20.   Admitted 10/21 w/ acute on chronic systolic heart failure w/ low output and AKI requiring milrinone to help w/ diuresis. He was cocaine positive. Diuresed and weaned off milrinone w/ stable co-ox at 65%. Scr had improved from 2.1>>1.7. D/c wt 159 lb. Unfortunately, his wife was also hospitalized for COVID and died while he was admitted.   Readmitted again 11/21 with marked volume overload and low output heart failure, cocaine+ again. He complained of abdominal pain, initially suspected to be 2/2 low output HF but CT showeda mass-like area in the antral pre-pyloric region of the stomach. EGD with complicated distal stomach ulcers. Pathology confirmed gastric cancer, intramucosal adenocarcinoma. Seen by oncology and not felt to be a candidate for surgery given other comorbidities.   He follow with Dr. Benay Spice and has been getting FOLFOX chemotherapy.   He presented to clinic 1/22 for f/u of CHF. He was doing well up until about 3-4 days ago.  He has been walking 1-1.5 miles most days of the week without dyspnea.  He had been out of torsemide for 4 days, weight up.  He noted orthopnea for the first time the previous night. Still smoking about 0.5 ppd.  No chest pain. He was instructed to take torsemide 80 mg bid x 4 days then 80 qam/60 qpm as well as Start Entresto 24/26 bid.  Today he returns for HF follow up. He ran out of torsemide on  Friday. Took 2 days of 80 mg bid, instead of 4. Overall feeling fine. Feels breathing is a little more short today. Denies increasing CP, dizziness, edema, or PND/Orthopnea. Appetite ok. No fever or chills. Weight at home ~177 pounds. Taking all medications. Smoking 1/2 ppd cigarettes. Has not used cocaine in 1 month.  REDS clip 36% ECG (personally reviewed): ST w/ PVCs 104 bpm, no significant changes since last ECG.  Labs (1/22): K 3.3, creatinine 1.29, hgb 13.8 Labs (2/22): K 3.8, creatinine 1.37, hgb 13.0  Review of systems complete and found to be negative unless listed in HPI.    PMH: 1. CAD: STEMI 2015 with DES to LAD.  - Cath in 10/19 showed 90% ostial OM1 and 70% PLV, no interventional target. 2. Hyperlipidemia 3. Cocaine abuse.  4. COPD: Ongoing smoking.  5. GERD 6. Gastric cancer: Intramucosal adenocarcinoma.  - FOLFOX chemotherapy 7. Chronic systolic CHF:  Mixed ischemic/nonischemic (cocaine) cardiomyopathy.  Thought to be end stage, has had low output requiring milrinone in the hospital but not candidate for LVAD, transplant, or home milrinone given ongoing cocaine abuse.  - Echo (10/21): EF 20-25%, global HK with moderate dilation, mild LVH, RV low normal systolic function, PASP 57 mmHg, moderate-severe MR, moderate-severe TR.  8. COVID-19 infection: 9/21.    Current Outpatient Medications  Medication Sig Dispense Refill  . albuterol (VENTOLIN HFA) 108 (90 Base) MCG/ACT inhaler Inhale 2 puffs into the lungs every 4 (four) hours as needed for wheezing or shortness of breath. 6.7 g 1  .  aspirin 81 MG EC tablet Take 1 tablet (81 mg total) by mouth daily. 30 tablet 11  . atorvastatin (LIPITOR) 80 MG tablet Take 1 tablet (80 mg total) by mouth daily. 30 tablet 11  . digoxin (LANOXIN) 0.125 MG tablet Take 1 tablet (0.125 mg total) by mouth daily. 30 tablet 11  . empagliflozin (JARDIANCE) 10 MG TABS tablet Take 1 tablet (10 mg total) by mouth daily. 30 tablet 11  .  isosorbide-hydrALAZINE (BIDIL) 20-37.5 MG tablet Take 1 tablet by mouth 3 (three) times daily. 90 tablet 11  . ivabradine (CORLANOR) 5 MG TABS tablet Take 1 tablet (5 mg total) by mouth 2 (two) times daily with a meal. 60 tablet 11  . lidocaine-prilocaine (EMLA) cream APPLY TO PORT TOPICALLY 1 TO 2 HOURS PRIOR TO STICK AND COVER WITH PLASTIC WRAP. 30 g 1  . Multiple Vitamin (MULTIVITAMIN ADULT PO) Take 1 tablet by mouth daily.    Marland Kitchen omeprazole (PRILOSEC) 20 MG capsule TAKE ONE CAPSULE BY MOUTH ONCE DAILY (MORNING) 30 capsule 1  . pantoprazole (PROTONIX) 40 MG tablet Take 1 tablet (40 mg total) by mouth 2 (two) times daily. 60 tablet 2  . potassium chloride SA (KLOR-CON) 20 MEQ tablet Take 1 tablet (20 mEq total) by mouth daily. 30 tablet 11  . torsemide (DEMADEX) 20 MG tablet Take 4 tablets (80 mg total) by mouth every morning AND 2 tablets (40 mg total) every evening. 180 tablet 11  . ondansetron (ZOFRAN) 8 MG tablet TAKE 1 TABLET (8 MG TOTAL) BY MOUTH EVERY 8 (EIGHT) HOURS AS NEEDED FOR NAUSEA OR VOMITING.(Patient not taking: No sig reported) 30 tablet 1  . prochlorperazine (COMPAZINE) 10 MG tablet TAKE 1 TABLET (10 MG TOTAL) BY MOUTH EVERY 6 (SIX) HOURS AS NEEDED FOR NAUSEA. (Patient not taking: No sig reported) 60 tablet 1  . spironolactone (ALDACTONE) 25 MG tablet Take 1 tablet (25 mg total) by mouth daily. (Patient not taking: No sig reported) 30 tablet 11   No current facility-administered medications for this encounter.    No Known Allergies    Social History   Socioeconomic History  . Marital status: Single    Spouse name: Not on file  . Number of children: 2  . Years of education: 51  . Highest education level: 12th grade  Occupational History  . Occupation: Landscaping  Tobacco Use  . Smoking status: Current Every Day Smoker    Packs/day: 0.25    Years: 30.00    Pack years: 7.50    Types: Cigarettes  . Smokeless tobacco: Never Used  . Tobacco comment: .5 PK PER DAY   Vaping Use  . Vaping Use: Never used  Substance and Sexual Activity  . Alcohol use: No    Comment: Patient denies abuse, states social drinker  . Drug use: Yes    Types: Cocaine, "Crack" cocaine    Comment: heroin  . Sexual activity: Yes  Other Topics Concern  . Not on file  Social History Narrative   Lives in [place] with his sister and girlfriend. He works Aeronautical engineer work.    Social Determinants of Health   Financial Resource Strain: Not on file  Food Insecurity: Food Insecurity Present  . Worried About Charity fundraiser in the Last Year: Sometimes true  . Ran Out of Food in the Last Year: Never true  Transportation Needs: No Transportation Needs  . Lack of Transportation (Medical): No  . Lack of Transportation (Non-Medical): No  Physical Activity: Not  on file  Stress: Not on file  Social Connections: Not on file  Intimate Partner Violence: Not on file     Family History  Problem Relation Age of Onset  . Cirrhosis Father   . Heart attack Mother   . Heart attack Sister   . Heart failure Sister   . Heart attack Brother     There were no vitals filed for this visit. Wt Readings from Last 3 Encounters:  02/19/21 76.7 kg (169 lb 1.6 oz)  02/10/21 79.3 kg (174 lb 12.8 oz)  02/05/21 77.6 kg (171 lb)   PHYSICAL EXAM:  ReDs Clip 36%  General:  NAD. No resp difficulty HEENT: Normal Neck: Supple. JVP 6-7. Carotids 2+ bilat; no bruits. No lymphadenopathy or thryomegaly appreciated. Cor: PMI nondisplaced. Regular rate & rhythm. No rubs, gallops, II/VI HSM LLSB. Lungs: Clear, diminished in bases, port in left chest Abdomen: Soft, nontender, nondistended. No hepatosplenomegaly. No bruits or masses. Good bowel sounds. Extremities: No cyanosis, clubbing, rash, edema Neuro: alert & oriented x 3, cranial nerves grossly intact. Moves all 4 extremities w/o difficulty. Affect pleasant.  ASSESSMENT & PLAN: 1. Chronic Systolic HF:  Anterior MI in 2015, DES to  LAD.  Echo in 2015 with EF 40-45%, thought to be ischemic cardiomyopathy. Cath in 10/19 showed 90% ostial OM1 and 70% PLV, no interventional target. 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 Last Echo 10/21 showed EF 20-25% with mild RV dysfunction.He has a strong family history of CHF/cardiomyopathy, so may be a component of familial cardiomyopathy.Cocaine likely plays a role in his cardiomyopathy.Multiple re-admits forcardiogenic shockin the last 6 months requiring milrinone. Unfortunately, not a candidate for advanced therapies or home milrinone with active use of cocaine and gastric cancer. He has end-stage HF.  On exam today, he is slightly volume overloaded and weight is up.  He has been out of torsemide for 3 days. NYHA class II-III symptoms.  REDS clip mildly elevated at 36%. - Given social situation, gastric cancer, and ongoing substance abuse,  he is not a candidate for any advanced therapies including home milrinone - Increase torsemide to 80 mg bid x 2 days, then back to 80 mg qam/60 mg qpm.   - Start spiro 12.5 mg daily. (may need to back down on KCl pending BMET). BMET today and in 1 week.  - Continue Entresto 24/26 mg bid (BP and renal function stable).  - Continue digoxin 0.125, check level today.  - Continue Jardiance 10 mg daily.  - Continue Bidil 1 tab tid.  - Continue ivabradine 5 mg bid.  ST on ECG today, consider increasing at next visit if HR still elevated. - No  blocker w/ h/o low output and cocaine use 2. CAD: H/o anterior STEMI with DES to LAD in 7/15. Cath in 10/19 with 90% ostial OM1 and 70% PLV, no interventional target. No chest pain.  -ContinueASA 81 and atorvastatin 80 mg daily.  - Needs to refrain from using cocaine - Lipids (1/22) ok.  - No chest pain. 3. Cocaine abuse:Has been + cocaine each hospital visit.  Encouraged abstinence. Last used 1 month ago. 4. Active smoker: Urged cessation.  5. CKD: Stage 3.  BMET today.  6. Mitral  Regurgitation: Moderate to severe on recent echo, suspect functional MR from dilated CMP.  - Not great Mitraclip candidate with cocaine abuse and compliance issues.  7. Gastric Cancer: Intramucosal adenocarcinoma.  - FOLFOX chemo ongoing.   Follow labs closely with addition of spiro and F/u in 2 wks  with APP to reassess volume status.   [place], FNP-BC 02/21/21

## 2021-02-21 NOTE — Patient Instructions (Signed)
Pegfilgrastim injection What is this medicine? PEGFILGRASTIM (PEG fil gra stim) is a long-acting granulocyte colony-stimulating factor that stimulates the growth of neutrophils, a type of white blood cell important in the body's fight against infection. It is used to reduce the incidence of fever and infection in patients with certain types of cancer who are receiving chemotherapy that affects the bone marrow, and to increase survival after being exposed to high doses of radiation. This medicine may be used for other purposes; ask your health care provider or pharmacist if you have questions. COMMON BRAND NAME(S): Fulphila, Neulasta, Nyvepria, UDENYCA, Ziextenzo What should I tell my health care provider before I take this medicine? They need to know if you have any of these conditions:  kidney disease  latex allergy  ongoing radiation therapy  sickle cell disease  skin reactions to acrylic adhesives (On-Body Injector only)  an unusual or allergic reaction to pegfilgrastim, filgrastim, other medicines, foods, dyes, or preservatives  pregnant or trying to get pregnant  breast-feeding How should I use this medicine? This medicine is for injection under the skin. If you get this medicine at home, you will be taught how to prepare and give the pre-filled syringe or how to use the On-body Injector. Refer to the patient Instructions for Use for detailed instructions. Use exactly as directed. Tell your healthcare provider immediately if you suspect that the On-body Injector may not have performed as intended or if you suspect the use of the On-body Injector resulted in a missed or partial dose. It is important that you put your used needles and syringes in a special sharps container. Do not put them in a trash can. If you do not have a sharps container, call your pharmacist or healthcare provider to get one. Talk to your pediatrician regarding the use of this medicine in children. While this drug  may be prescribed for selected conditions, precautions do apply. Overdosage: If you think you have taken too much of this medicine contact a poison control center or emergency room at once. NOTE: This medicine is only for you. Do not share this medicine with others. What if I miss a dose? It is important not to miss your dose. Call your doctor or health care professional if you miss your dose. If you miss a dose due to an On-body Injector failure or leakage, a new dose should be administered as soon as possible using a single prefilled syringe for manual use. What may interact with this medicine? Interactions have not been studied. This list may not describe all possible interactions. Give your health care provider a list of all the medicines, herbs, non-prescription drugs, or dietary supplements you use. Also tell them if you smoke, drink alcohol, or use illegal drugs. Some items may interact with your medicine. What should I watch for while using this medicine? Your condition will be monitored carefully while you are receiving this medicine. You may need blood work done while you are taking this medicine. Talk to your health care provider about your risk of cancer. You may be more at risk for certain types of cancer if you take this medicine. If you are going to need a MRI, CT scan, or other procedure, tell your doctor that you are using this medicine (On-Body Injector only). What side effects may I notice from receiving this medicine? Side effects that you should report to your doctor or health care professional as soon as possible:  allergic reactions (skin rash, itching or hives, swelling of   the face, lips, or tongue)  back pain  dizziness  fever  pain, redness, or irritation at site where injected  pinpoint red spots on the skin  red or dark-brown urine  shortness of breath or breathing problems  stomach or side pain, or pain at the shoulder  swelling  tiredness  trouble  passing urine or change in the amount of urine  unusual bruising or bleeding Side effects that usually do not require medical attention (report to your doctor or health care professional if they continue or are bothersome):  bone pain  muscle pain This list may not describe all possible side effects. Call your doctor for medical advice about side effects. You may report side effects to FDA at 1-800-FDA-1088. Where should I keep my medicine? Keep out of the reach of children. If you are using this medicine at home, you will be instructed on how to store it. Throw away any unused medicine after the expiration date on the label. NOTE: This sheet is a summary. It may not cover all possible information. If you have questions about this medicine, talk to your doctor, pharmacist, or health care provider.  2021 Elsevier/Gold Standard (2020-01-05 13:20:51)  

## 2021-02-24 ENCOUNTER — Telehealth (HOSPITAL_COMMUNITY): Payer: Self-pay | Admitting: Cardiology

## 2021-02-24 ENCOUNTER — Telehealth (HOSPITAL_COMMUNITY): Payer: Self-pay | Admitting: Licensed Clinical Social Worker

## 2021-02-24 ENCOUNTER — Encounter (HOSPITAL_COMMUNITY): Payer: Medicaid Other

## 2021-02-24 ENCOUNTER — Telehealth: Payer: Self-pay

## 2021-02-24 NOTE — Telephone Encounter (Signed)
CSW received call from pt reporting he was unable to come to appt this morning because [facility] could not find a ride for him.  He also states he is feeling very poorly due to his chemo treatment and would prefer to reschedule for a later date.  CSW informed clinic staff and they were able to schedule him for next week- CSW texted pt appt information  Will continue to follow and assist as needed  [name], [place] Worker [facility] Desk#: [phone] Cell#: [phone]

## 2021-02-24 NOTE — Telephone Encounter (Signed)
appt call reminder placed Left appt details on voicemail

## 2021-02-24 NOTE — Telephone Encounter (Signed)
-----   Message from Tyler Pier, MD sent at 02/23/2021  6:32 PM EST ----- Please call patient, gastric mass no longer seen, f/u as scheduled

## 2021-02-24 NOTE — Telephone Encounter (Signed)
Spoke with pt made him aware of most recent CT scan

## 2021-02-27 DIAGNOSIS — Z1152 Encounter for screening for COVID-19: Secondary | ICD-10-CM | POA: Diagnosis not present

## 2021-03-02 ENCOUNTER — Other Ambulatory Visit: Payer: Self-pay | Admitting: Oncology

## 2021-03-04 ENCOUNTER — Telehealth (HOSPITAL_COMMUNITY): Payer: Self-pay | Admitting: Licensed Clinical Social Worker

## 2021-03-04 ENCOUNTER — Other Ambulatory Visit (HOSPITAL_COMMUNITY): Payer: Self-pay | Admitting: Cardiology

## 2021-03-04 DIAGNOSIS — Z9861 Coronary angioplasty status: Secondary | ICD-10-CM

## 2021-03-04 DIAGNOSIS — I2510 Atherosclerotic heart disease of native coronary artery without angina pectoris: Secondary | ICD-10-CM

## 2021-03-04 NOTE — Progress Notes (Incomplete)
Advanced Heart Failure Clinic Note   Referring Physician: PCP: [name], NP AHFC: Dr. Aundra Dubin   Reason for Visit:  Chronic Systolic Heart Failure   HPI: 55 y.o. 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 Also w/ h/o poor compliance. Previously referred to paramedicine but not fully cooperative. Diagnosed w/ COVID 09/19/20.   Admitted 10/21 w/ acute on chronic systolic heart failure w/ low output and AKI requiring milrinone to help w/ diuresis. He was cocaine positive. Diuresed and weaned off milrinone w/ stable co-ox at 65%. Scr had improved from 2.1>>1.7. D/c wt 159 lb. Unfortunately, his wife was also hospitalized for COVID and died while he was admitted.   Readmitted again 11/21 with marked volume overload and low output heart failure, cocaine+ again. He complained of abdominal pain, initially suspected to be 2/2 low output HF but CT showeda mass-like area in the antral pre-pyloric region of the stomach. EGD with complicated distal stomach ulcers. Pathology confirmed gastric cancer, intramucosal adenocarcinoma. Seen by oncology and not felt to be a candidate for surgery given other comorbidities.   He follow with Dr. Benay Spice and has been getting FOLFOX chemotherapy.   He presented to clinic 1/22 for f/u of CHF. He was doing well up until about 3-4 days ago.  He has been walking 1-1.5 miles most days of the week without dyspnea.  He had been out of torsemide for 4 days, weight up.  He noted orthopnea for the first time the previous night. Still smoking about 0.5 ppd.  No chest pain. He was instructed to take torsemide 80 mg bid x 4 days then 80 qam/60 qpm as well as Start Entresto 24/26 bid.  Today he returns for HF follow up. He ran out of torsemide on  Friday. Took 2 days of 80 mg bid, instead of 4. Overall feeling fine. Feels breathing is a little more short today. Denies increasing CP, dizziness, edema, or PND/Orthopnea. Appetite ok. No fever or chills. Weight at home ~177 pounds. Taking all medications. Smoking 1/2 ppd cigarettes. Has not used cocaine in 1 month.  REDS clip 36% ECG (personally reviewed): ST w/ PVCs 104 bpm, no significant changes since last ECG.  Labs (1/22): K 3.3, creatinine 1.29, hgb 13.8 Labs (2/22): K 3.8, creatinine 1.37, hgb 13.0  Review of systems complete and found to be negative unless listed in HPI.    PMH: 1. CAD: STEMI 2015 with DES to LAD.  - Cath in 10/19 showed 90% ostial OM1 and 70% PLV, no interventional target. 2. Hyperlipidemia 3. Cocaine abuse.  4. COPD: Ongoing smoking.  5. GERD 6. Gastric cancer: Intramucosal adenocarcinoma.  - FOLFOX chemotherapy 7. Chronic systolic CHF:  Mixed ischemic/nonischemic (cocaine) cardiomyopathy.  Thought to be end stage, has had low output requiring milrinone in the hospital but not candidate for LVAD, transplant, or home milrinone given ongoing cocaine abuse.  - Echo (10/21): EF 20-25%, global HK with moderate dilation, mild LVH, RV low normal systolic function, PASP 57 mmHg, moderate-severe MR, moderate-severe TR.  8. COVID-19 infection: 9/21.    Current Outpatient Medications  Medication Sig Dispense Refill  . albuterol (VENTOLIN HFA) 108 (90 Base) MCG/ACT inhaler Inhale 2 puffs into the lungs every 4 (four) hours as needed for wheezing or shortness of breath. 6.7 g 1  .  aspirin 81 MG EC tablet Take 1 tablet (81 mg total) by mouth daily. 30 tablet 11  . atorvastatin (LIPITOR) 80 MG tablet Take 1 tablet (80 mg total) by mouth daily. 30 tablet 11  . digoxin (LANOXIN) 0.125 MG tablet Take 1 tablet (0.125 mg total) by mouth daily. 30 tablet 11  . empagliflozin (JARDIANCE) 10 MG TABS tablet Take 1 tablet (10 mg total) by mouth daily. 30 tablet 11  .  isosorbide-hydrALAZINE (BIDIL) 20-37.5 MG tablet Take 1 tablet by mouth 3 (three) times daily. 90 tablet 11  . ivabradine (CORLANOR) 5 MG TABS tablet Take 1 tablet (5 mg total) by mouth 2 (two) times daily with a meal. 60 tablet 11  . lidocaine-prilocaine (EMLA) cream APPLY TO PORT TOPICALLY 1 TO 2 HOURS PRIOR TO STICK AND COVER WITH PLASTIC WRAP. 30 g 1  . Multiple Vitamin (MULTIVITAMIN ADULT PO) Take 1 tablet by mouth daily.    Marland Kitchen omeprazole (PRILOSEC) 20 MG capsule TAKE ONE CAPSULE BY MOUTH ONCE DAILY (MORNING) 30 capsule 1  . pantoprazole (PROTONIX) 40 MG tablet Take 1 tablet (40 mg total) by mouth 2 (two) times daily. 60 tablet 2  . potassium chloride SA (KLOR-CON) 20 MEQ tablet Take 1 tablet (20 mEq total) by mouth daily. 30 tablet 11  . torsemide (DEMADEX) 20 MG tablet Take 4 tablets (80 mg total) by mouth every morning AND 2 tablets (40 mg total) every evening. 180 tablet 11  . ondansetron (ZOFRAN) 8 MG tablet TAKE 1 TABLET (8 MG TOTAL) BY MOUTH EVERY 8 (EIGHT) HOURS AS NEEDED FOR NAUSEA OR VOMITING.(Patient not taking: No sig reported) 30 tablet 1  . prochlorperazine (COMPAZINE) 10 MG tablet TAKE 1 TABLET (10 MG TOTAL) BY MOUTH EVERY 6 (SIX) HOURS AS NEEDED FOR NAUSEA. (Patient not taking: No sig reported) 60 tablet 1  . spironolactone (ALDACTONE) 25 MG tablet Take 1 tablet (25 mg total) by mouth daily. (Patient not taking: No sig reported) 30 tablet 11   No current facility-administered medications for this encounter.   No Known Allergies  Social History   Socioeconomic History  . Marital status: Single    Spouse name: Not on file  . Number of children: 2  . Years of education: 51  . Highest education level: 12th grade  Occupational History  . Occupation: Landscaping  Tobacco Use  . Smoking status: Current Every Day Smoker    Packs/day: 0.25    Years: 30.00    Pack years: 7.50    Types: Cigarettes  . Smokeless tobacco: Never Used  . Tobacco comment: .5 PK PER DAY  Vaping  Use  . Vaping Use: Never used  Substance and Sexual Activity  . Alcohol use: No    Comment: Patient denies abuse, states social drinker  . Drug use: Yes    Types: Cocaine, "Crack" cocaine    Comment: heroin  . Sexual activity: Yes  Other Topics Concern  . Not on file  Social History Narrative   Lives in [place] with his sister and girlfriend. He works Aeronautical engineer work.    Social Determinants of Health   Financial Resource Strain: Not on file  Food Insecurity: Food Insecurity Present  . Worried About Charity fundraiser in the Last Year: Sometimes true  . Ran Out of Food in the Last Year: Never true  Transportation Needs: No Transportation Needs  . Lack of Transportation (Medical): No  . Lack of Transportation (Non-Medical): No  Physical Activity: Not on file  Stress: Not on file  Social Connections: Not on file  Intimate Partner Violence: Not on file     Family History  Problem Relation Age of Onset  . Cirrhosis Father   . Heart attack Mother   . Heart attack Sister   . Heart failure Sister   . Heart attack Brother     There were no vitals filed for this visit. Wt Readings from Last 3 Encounters:  02/19/21 76.7 kg (169 lb 1.6 oz)  02/10/21 79.3 kg (174 lb 12.8 oz)  02/05/21 77.6 kg (171 lb)   PHYSICAL EXAM:  ReDs Clip 36%  General:  NAD. No resp difficulty HEENT: Normal Neck: Supple. No JVD. Carotids 2+ bilat; no bruits. No lymphadenopathy or thryomegaly appreciated. Cor: PMI nondisplaced. Regular rate & rhythm. No rubs, gallops, II/VI HSM LUSB Lungs: Clear Abdomen: Soft, nontender, nondistended. No hepatosplenomegaly. No bruits or masses. Good bowel sounds. Extremities: No cyanosis, clubbing, rash, edema Neuro: alert & oriented x 3, cranial nerves grossly intact. Moves all 4 extremities w/o difficulty. Affect pleasant.  ASSESSMENT & PLAN: 1. Chronic Systolic HF:  Anterior MI in 2015, DES to LAD.  Echo in 2015 with EF 40-45%, thought to be  ischemic cardiomyopathy. Cath in 10/19 showed 90% ostial OM1 and 70% PLV, no interventional target. 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 Last Echo 10/21 showed EF 20-25% with mild RV dysfunction.He has a strong family history of CHF/cardiomyopathy, so may be a component of familial cardiomyopathy.Cocaine likely plays a role in his cardiomyopathy.Multiple re-admits forcardiogenic shockin the last 6 months requiring milrinone. Unfortunately, not a candidate for advanced therapies or home milrinone with active use of cocaine and gastric cancer. He has end-stage HF.  On exam today, he is slightly volume overloaded and weight is up.  He has been out of torsemide for 3 days. NYHA class II-III symptoms.  REDS clip mildly elevated at 36%. - Given social situation, gastric cancer, and ongoing substance abuse,  he is not a candidate for any advanced therapies including home milrinone - Continue torsemide 80 mg qam/60 mg qpm.   - Continue spiro 12.5 mg daily. (may need to back down on KCl pending BMET). BMET today and in 1 week.  - Continue Entresto 24/26 mg bid (BP and renal function stable).  - Continue digoxin 0.125, check level today.  - Continue Jardiance 10 mg daily.  - Continue Bidil 1 tab tid.  - Continue ivabradine 5 mg bid.  ST on ECG today, consider increasing at next visit if HR still elevated. - No  blocker w/ h/o low output and cocaine use 2. CAD: H/o anterior STEMI with DES to LAD in 7/15. Cath in 10/19 with 90% ostial OM1 and 70% PLV, no interventional target. No chest pain.  -ContinueASA 81 and atorvastatin 80 mg daily.  - Needs to refrain from using cocaine - Lipids (1/22) ok.  - No chest pain. 3. Cocaine abuse:Has been + cocaine each hospital visit.  Encouraged abstinence. Last used 1 month ago. 4. Active smoker: Urged cessation.  5. CKD: Stage 3.  BMET today.  6. Mitral Regurgitation: Moderate to severe on recent echo, suspect functional MR from dilated  CMP.  - Not great Mitraclip candidate with cocaine abuse and compliance issues.  7. Gastric Cancer: Intramucosal adenocarcinoma.  - FOLFOX chemo ongoing.   Follow labs closely with addition of spiro and F/u in 2 wks with APP to reassess volume status.   [place], FNP-BC 03/04/21

## 2021-03-04 NOTE — Telephone Encounter (Signed)
CSW received call from pt stating that he can't get a ride to tomorrows appt- [facility] is booked from 5:30am-4pm tomorrow.  Pt unable to find alternative transport so we had to reschedule appt for the end of the month.   Pt informed of new appt details- no further concerns at this time  Will continue to follow and assist as needed  [name], [place] Worker [facility] Desk#: [phone] Cell#: [phone]

## 2021-03-05 ENCOUNTER — Ambulatory Visit: Payer: Medicaid Other | Admitting: Oncology

## 2021-03-05 ENCOUNTER — Other Ambulatory Visit: Payer: Medicaid Other

## 2021-03-05 ENCOUNTER — Encounter (HOSPITAL_COMMUNITY): Payer: Medicaid Other

## 2021-03-05 MED FILL — Dexamethasone Sodium Phosphate Inj 100 MG/10ML: INTRAMUSCULAR | Qty: 1 | Status: AC

## 2021-03-06 ENCOUNTER — Inpatient Hospital Stay: Payer: Medicaid Other

## 2021-03-06 ENCOUNTER — Encounter: Payer: Self-pay | Admitting: Nurse Practitioner

## 2021-03-06 ENCOUNTER — Inpatient Hospital Stay (HOSPITAL_BASED_OUTPATIENT_CLINIC_OR_DEPARTMENT_OTHER): Payer: Medicaid Other | Admitting: Nurse Practitioner

## 2021-03-06 ENCOUNTER — Other Ambulatory Visit: Payer: Self-pay

## 2021-03-06 ENCOUNTER — Inpatient Hospital Stay: Payer: Medicaid Other | Attending: Oncology

## 2021-03-06 VITALS — BP 95/66 | HR 96 | Temp 98.4°F | Resp 18 | Ht 70.0 in | Wt 166.5 lb

## 2021-03-06 DIAGNOSIS — K5900 Constipation, unspecified: Secondary | ICD-10-CM | POA: Insufficient documentation

## 2021-03-06 DIAGNOSIS — Z95828 Presence of other vascular implants and grafts: Secondary | ICD-10-CM

## 2021-03-06 DIAGNOSIS — N189 Chronic kidney disease, unspecified: Secondary | ICD-10-CM | POA: Insufficient documentation

## 2021-03-06 DIAGNOSIS — I2510 Atherosclerotic heart disease of native coronary artery without angina pectoris: Secondary | ICD-10-CM | POA: Diagnosis not present

## 2021-03-06 DIAGNOSIS — I509 Heart failure, unspecified: Secondary | ICD-10-CM | POA: Insufficient documentation

## 2021-03-06 DIAGNOSIS — Z452 Encounter for adjustment and management of vascular access device: Secondary | ICD-10-CM | POA: Diagnosis not present

## 2021-03-06 DIAGNOSIS — J449 Chronic obstructive pulmonary disease, unspecified: Secondary | ICD-10-CM | POA: Diagnosis not present

## 2021-03-06 DIAGNOSIS — C163 Malignant neoplasm of pyloric antrum: Secondary | ICD-10-CM

## 2021-03-06 LAB — CMP (CANCER CENTER ONLY)
ALT: 25 U/L (ref 0–44)
AST: 26 U/L (ref 15–41)
Albumin: 3.6 g/dL (ref 3.5–5.0)
Alkaline Phosphatase: 176 U/L — ABNORMAL HIGH (ref 38–126)
Anion gap: 8 (ref 5–15)
BUN: 19 mg/dL (ref 6–20)
CO2: 32 mmol/L (ref 22–32)
Calcium: 9 mg/dL (ref 8.9–10.3)
Chloride: 95 mmol/L — ABNORMAL LOW (ref 98–111)
Creatinine: 1.34 mg/dL — ABNORMAL HIGH (ref 0.61–1.24)
GFR, Estimated: >60 mL/min (ref >60)
Glucose, Bld: 101 mg/dL — ABNORMAL HIGH (ref 70–99)
Potassium: 3.4 mmol/L — ABNORMAL LOW (ref 3.5–5.1)
Sodium: 135 mmol/L (ref 135–145)
Total Bilirubin: 0.7 mg/dL (ref 0.3–1.2)
Total Protein: 7.2 g/dL (ref 6.5–8.1)

## 2021-03-06 LAB — CBC WITH DIFFERENTIAL (CANCER CENTER ONLY)
Abs Immature Granulocytes: 0.29 10*3/uL — ABNORMAL HIGH (ref 0.00–0.07)
Basophils Absolute: 0.1 10*3/uL (ref 0.0–0.1)
Basophils Relative: 1 %
Eosinophils Absolute: 0.1 10*3/uL (ref 0.0–0.5)
Eosinophils Relative: 1 %
HCT: 38.7 % — ABNORMAL LOW (ref 39.0–52.0)
Hemoglobin: 12.8 g/dL — ABNORMAL LOW (ref 13.0–17.0)
Immature Granulocytes: 4 %
Lymphocytes Relative: 13 %
Lymphs Abs: 1.1 10*3/uL (ref 0.7–4.0)
MCH: 28.8 pg (ref 26.0–34.0)
MCHC: 33.1 g/dL (ref 30.0–36.0)
MCV: 87 fL (ref 80.0–100.0)
Monocytes Absolute: 1.6 10*3/uL — ABNORMAL HIGH (ref 0.1–1.0)
Monocytes Relative: 20 %
Neutro Abs: 5 10*3/uL (ref 1.7–7.7)
Neutrophils Relative %: 61 %
Platelet Count: 192 10*3/uL (ref 150–400)
RBC: 4.45 MIL/uL (ref 4.22–5.81)
RDW: 21.9 % — ABNORMAL HIGH (ref 11.5–15.5)
WBC Count: 8.1 10*3/uL (ref 4.0–10.5)
nRBC: 0.2 % (ref 0.0–0.2)

## 2021-03-06 MED ORDER — SODIUM CHLORIDE 0.9% FLUSH
10.0000 mL | Freq: Once | INTRAVENOUS | Status: AC
  Administered 2021-03-06: 10 mL via INTRAVENOUS
  Filled 2021-03-06: qty 10

## 2021-03-06 NOTE — Progress Notes (Addendum)
[place] OFFICE PROGRESS NOTE   Diagnosis: Gastric cancer  INTERVAL HISTORY:   Mr. Pendry returns as scheduled.  He completed another cycle of FOLFOX 02/19/2021. He denies nausea/vomiting.  He had a few mouth sores which did not interfere with his ability to eat or drink.  No diarrhea.  Intermittent numbness/tingling in the hands and feet with cold exposure.  Objective:  Vital signs in last 24 hours:  Blood pressure 95/66, pulse 96, temperature 98.4 F (36.9 C), temperature source Tympanic, resp. rate 18, height _0  (1.778 m), weight 166 lb 8 oz (75.5 kg), SpO2 99 %.    HEENT: No thrush or ulcers. Resp: Lungs clear bilaterally. Cardio: Regular rate and rhythm. GI: No hepatomegaly. Vascular: No leg edema. Neuro: Vibratory sense moderately decreased over the fingertips per tuning fork exam. Skin: Palms with hyperpigmentation and skin thickening. Port-A-Cath without erythema.   Lab Results:  Lab Results  Component Value Date   WBC 8.1 03/06/2021   HGB 12.8 (L) 03/06/2021   HCT 38.7 (L) 03/06/2021   MCV 87.0 03/06/2021   PLT 192 03/06/2021   NEUTROABS 5.0 03/06/2021    Imaging:  No results found.  Medications: I have reviewed the patient's current medications.  Assessment/Plan: 1. Gastric cancer  CT abdomen/pelvis 11/01/2020-masslike area measuring 6.3 x 5.6 x 5.6 cm in the antral prepyloric region of the stomach  Upper endoscopy 11/05/2020-patchy white plaques in the distal esophagus; 3 nonbleeding cratered gastric ulcers with pigmented material in the gastric antrum largest 10 mm; segmental moderate inflammation characterized by congestion, erosions and erythema in the gastric antrum. 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  No lymphadenopathy or metastatic disease in the abdomen or pelvis. 2. CHF, LVEF 20-25% 10/04/2020 3. CAD status post prior LAD stent 4. COPD 5. Chronic kidney disease 6. Cocaine usage 7. Port-A-Cath placement 12/06/2020, Interventional Radiology 8. Mild neutropenia secondary to chemotherapy-Udenyca added with cycle 5 FOLFOX   Disposition: Mr. Curto appears stable.  He has completed 6 cycles of FOLFOX.  The gastric mass was not seen on the recent restaging CT scans and there is no evidence of metastatic disease in the abdomen or pelvis.  Dr. Benay Spice recommends a referral to Dr. Michail Sermon for a restaging upper endoscopy and a referral to Dr. Lisbeth Renshaw for consideration of radiation.  Both referrals placed today.  Chemotherapy is being placed on hold.  His case will be presented at the upcoming GI tumor conference.  We will see him in follow-up in 2 weeks.  Patient seen with Dr. Benay Spice.  CT images reviewed on the computer with Mr. Alper by Dr. Benay Spice.  Ned Card ANP/GNP-BC   03/06/2021  12:04 PM  This was a shared visit with Ned Card.  Mr. Paparella appears stable.  We reviewed the restaging CT results and images with him.  He has experienced significant clinical improvement and there is no measurable disease on the restaging CT.  We will refer him to Dr.  Schooler for a restaging endoscopy.  We will make a referral to radiation oncology to consider concurrent 5-FU and radiation to be given as consolidation therapy.  I was present for greater than 50% of today's visit.  I  performed medical decision making.  Julieanne Manson, MD

## 2021-03-06 NOTE — Patient Instructions (Signed)

## 2021-03-07 ENCOUNTER — Encounter: Payer: Self-pay | Admitting: *Deleted

## 2021-03-07 NOTE — Progress Notes (Signed)
MD requesting PD-1 testing on his tissue. Last block sent was insufficient tissue. Sent email to [facility] pathology to see if there is any other tissue block that could be submitted for this.

## 2021-03-08 ENCOUNTER — Inpatient Hospital Stay: Payer: Medicaid Other

## 2021-03-12 ENCOUNTER — Encounter: Payer: Self-pay | Admitting: Radiation Oncology

## 2021-03-12 ENCOUNTER — Ambulatory Visit
Admission: RE | Admit: 2021-03-12 | Discharge: 2021-03-12 | Disposition: A | Discharge status: Home / Self Care | Payer: Medicaid Other | Source: Ambulatory Visit | Attending: Radiation Oncology | Admitting: Radiation Oncology

## 2021-03-12 ENCOUNTER — Other Ambulatory Visit: Payer: Self-pay

## 2021-03-12 VITALS — BP 105/74 | HR 96 | Temp 98.6°F | Resp 18 | Ht 70.5 in | Wt 166.0 lb

## 2021-03-12 DIAGNOSIS — J449 Chronic obstructive pulmonary disease, unspecified: Secondary | ICD-10-CM | POA: Insufficient documentation

## 2021-03-12 DIAGNOSIS — I10 Essential (primary) hypertension: Secondary | ICD-10-CM | POA: Diagnosis not present

## 2021-03-12 DIAGNOSIS — F17210 Nicotine dependence, cigarettes, uncomplicated: Secondary | ICD-10-CM | POA: Diagnosis not present

## 2021-03-12 DIAGNOSIS — I252 Old myocardial infarction: Secondary | ICD-10-CM | POA: Insufficient documentation

## 2021-03-12 DIAGNOSIS — C163 Malignant neoplasm of pyloric antrum: Secondary | ICD-10-CM

## 2021-03-12 DIAGNOSIS — F1410 Cocaine abuse, uncomplicated: Secondary | ICD-10-CM | POA: Insufficient documentation

## 2021-03-12 DIAGNOSIS — R112 Nausea with vomiting, unspecified: Secondary | ICD-10-CM | POA: Diagnosis not present

## 2021-03-12 DIAGNOSIS — I2510 Atherosclerotic heart disease of native coronary artery without angina pectoris: Secondary | ICD-10-CM | POA: Diagnosis not present

## 2021-03-12 DIAGNOSIS — Z79899 Other long term (current) drug therapy: Secondary | ICD-10-CM | POA: Diagnosis not present

## 2021-03-12 DIAGNOSIS — K219 Gastro-esophageal reflux disease without esophagitis: Secondary | ICD-10-CM | POA: Diagnosis not present

## 2021-03-12 DIAGNOSIS — I509 Heart failure, unspecified: Secondary | ICD-10-CM | POA: Diagnosis not present

## 2021-03-12 NOTE — Progress Notes (Signed)
Radiation Oncology         [phone] ________________________________  Consultation  Name: Tyler Wong        MRN: [id number]  Date of Service: 03/12/2021 DOB: 1966/02/10  OE:VOJJKKX, Tyler Cage, NP  Tyler Pier, MD     REFERRING PHYSICIAN: Ladell Pier, MD   DIAGNOSIS: The encounter diagnosis was Malignant neoplasm of pyloric antrum ([place]).   HISTORY OF PRESENT ILLNESS: Tyler Wong is a 55 y.o. male seen at the request of Dr. Benay Wong for a diagnosis of gastric cancer.  The patient was originally diagnosed with his cancer in November 2021 after having episodes of nausea and vomiting and abdominal pain that led to an upper endoscopy on 11/05/2020 he had 3 nonbleeding gastric ulcers in the gastric antrum erosions and erythema and biopsy of a stomach ulcer revealed intramucosal adenocarcinoma in a background of intestinal metaplasia.  He had pet imaging that showed focal hypermetabolic activity in the gastric antrum there were some small lymph nodes in the chest with mild increased metabolic activity felt to be reactive, there was also a questionable finding in the liver, and he subsequently began systemic chemotherapy with FOLFOX and has completed 6 cycles.  Restaging CT scans show there was no evidence of metastatic disease in the abdomen or pelvis and but there was no visible findings of the stomach.  He will have restaging endoscopy and because of his cardiac disease is not a candidate for surgical resection.  He is seen today to discuss definitive radiotherapy.   PREVIOUS RADIATION THERAPY: No   PAST MEDICAL HISTORY:  Past Medical History:  Diagnosis Date  . CAD in native artery 07/17/14   STEMI- LAD stenosis with DES  . CHF (congestive heart failure) ([place])   . Cocaine abuse ([place])   . COPD (chronic obstructive pulmonary disease) ([place])   . Dyspnea   . Gastric cancer ([place]) 11/01/2020  . GERD (gastroesophageal reflux disease)   . Hypertension   . ST elevation myocardial  infarction (STEMI) involving left anterior descending (LAD) coronary artery with complication ([place])   . Tobacco abuse 07/17/2014       PAST SURGICAL HISTORY: Past Surgical History:  Procedure Laterality Date  . BIOPSY  11/05/2020   Procedure: BIOPSY;  Surgeon: Wilford Corner, MD;  Location: [place];  Service: Endoscopy;;  . CORONARY ANGIOPLASTY WITH STENT PLACEMENT  07/18/14   resolute DES to LAD STEMI  . ESOPHAGOGASTRODUODENOSCOPY N/A 11/05/2020   Procedure: ESOPHAGOGASTRODUODENOSCOPY (EGD);  Surgeon: Wilford Corner, MD;  Location: [place];  Service: Endoscopy;  Laterality: N/A;  . IR IMAGING GUIDED PORT INSERTION  12/06/2020  . LEFT HEART CATH N/A 07/17/2014   Procedure: LEFT HEART CATH;  Surgeon: [name], MD;  Location: [facility] CATH LAB;  Service: Cardiovascular;  Laterality: N/A;  . PERCUTANEOUS CORONARY STENT INTERVENTION (PCI-S)  07/17/2014   Procedure: PERCUTANEOUS CORONARY STENT INTERVENTION (PCI-S);  Surgeon: [name], MD;  Location: [facility] CATH LAB;  Service: Cardiovascular;;  . RIGHT/LEFT HEART CATH AND CORONARY ANGIOGRAPHY N/A 10/03/2018   Procedure: RIGHT/LEFT HEART CATH AND CORONARY ANGIOGRAPHY;  Surgeon: [name], MD;  Location: [place] CV LAB;  Service: Cardiovascular;  Laterality: N/A;     FAMILY HISTORY:  Family History  Problem Relation Age of Onset  . Cirrhosis Father   . Heart attack Mother   . Heart attack Sister   . Heart failure Sister   . Heart attack Brother      SOCIAL HISTORY:  reports that he  has been smoking cigarettes. He has a 7.50 pack-year smoking history. He has never used smokeless tobacco. He reports current drug use. Drugs: Cocaine and "Crack" cocaine. He reports that he does not drink alcohol. The patient is single and lives in [place].     ALLERGIES: Patient has no known allergies.   MEDICATIONS:  Current Outpatient Medications  Medication Sig Dispense Refill  . ASPIRIN LOW DOSE 81 MG EC tablet TAKE 1  TABLET (81 MG TOTAL) BY MOUTH DAILY (MORNING) 30 tablet 11  . atorvastatin (LIPITOR) 80 MG tablet Take 1 tablet (80 mg total) by mouth daily. 30 tablet 11  . BIDIL 20-37.5 MG tablet TAKE 1 TABLET BY MOUTH 3 (THREE) TIMES DAILY.(AM+NOON+BEDTIME) 90 tablet 11  . digoxin (LANOXIN) 0.125 MG tablet Take 1 tablet (0.125 mg total) by mouth daily. 30 tablet 11  . empagliflozin (JARDIANCE) 10 MG TABS tablet Take 1 tablet (10 mg total) by mouth daily. 30 tablet 11  . ivabradine (CORLANOR) 5 MG TABS tablet Take 1 tablet (5 mg total) by mouth 2 (two) times daily with a meal. 60 tablet 11  . Multiple Vitamin (MULTIVITAMIN ADULT PO) Take 1 tablet by mouth daily.    Mar[place] Kitchen omeprazole (PRILOSEC) 20 MG capsule TAKE ONE CAPSULE BY MOUTH ONCE DAILY (MORNING) 30 capsule 0  . potassium chloride SA (KLOR-CON) 20 MEQ tablet Take 2 tablets (40 mEq total) by mouth daily. 60 tablet 11  . Probiotic Product (PROBIOTIC DAILY PO) Take 1 capsule by mouth daily.    . sacubitril-valsartan (ENTRESTO) 24-26 MG Take 1 tablet by mouth 2 (two) times daily. 60 tablet 11  . spironolactone (ALDACTONE) 25 MG tablet Take 0.5 tablets (12.5 mg total) by mouth daily. 45 tablet 3  . torsemide (DEMADEX) 20 MG tablet Take 4 tablets (80 mg total) by mouth in the morning AND 3 tablets (60 mg total) every evening. 630 tablet 2  . albuterol (VENTOLIN HFA) 108 (90 Base) MCG/ACT inhaler Inhale 2 puffs into the lungs every 4 (four) hours as needed for wheezing or shortness of breath. (Patient not taking: No sig reported) 6.7 g 1  . pantoprazole (PROTONIX) 40 MG tablet Take 1 tablet (40 mg total) by mouth 2 (two) times daily. 60 tablet 2   No current facility-administered medications for this encounter.     REVIEW OF SYSTEMS: On review of systems, the patient reports that he is doing well since completing chemotherapy. He discusses that he tolerated chemo well and didn't have too much nausea, and hasn't had recent emesis. He denies any abdominal pain or  trouble with bowel function and is eating what he feels like without intolerance to certain foods. No other complaints are noted.      PHYSICAL EXAM:  Wt Readings from Last 3 Encounters:  03/12/21 166 lb (75.3 kg)  03/06/21 166 lb 8 oz (75.5 kg)  02/19/21 169 lb 1.6 oz (76.7 kg)   Temp Readings from Last 3 Encounters:  03/12/21 98.6 F (37 C)  03/06/21 98.4 F (36.9 C) (Tympanic)  02/21/21 98.6 F (37 C) (Oral)   BP Readings from Last 3 Encounters:  03/12/21 105/74  03/06/21 95/66  02/21/21 105/73   Pulse Readings from Last 3 Encounters:  03/12/21 96  03/06/21 96  02/21/21 (!) 54   In general this is a well appearing African American male in no acute distress. He's alert and oriented x4 and appropriate throughout the examination. Cardiopulmonary assessment is negative for acute distress and he exhibits normal effort.  ECOG = 0  0 - Asymptomatic (Fully active, able to carry on all predisease activities without restriction)  1 - Symptomatic but completely ambulatory (Restricted in physically strenuous activity but ambulatory and able to carry out work of a light or sedentary nature. For example, light housework, office work)  2 - Symptomatic, <50% in bed during the day (Ambulatory and capable of all self care but unable to carry out any work activities. Up and about more than 50% of waking hours)  3 - Symptomatic, >50% in bed, but not bedbound (Capable of only limited self-care, confined to bed or chair 50% or more of waking hours)  4 - Bedbound (Completely disabled. Cannot carry on any self-care. Totally confined to bed or chair)  5 - Death   Eustace Pen MM, Creech RH, Tormey DC, et al. [phone]). "Toxicity and response criteria of the [facility]". [place] Oncol. 5 (6): 649-55    LABORATORY DATA:  Lab Results  Component Value Date   WBC 8.1 03/06/2021   HGB 12.8 (L) 03/06/2021   HCT 38.7 (L) 03/06/2021   MCV 87.0 03/06/2021   PLT 192  03/06/2021   Lab Results  Component Value Date   NA 135 03/06/2021   K 3.4 (L) 03/06/2021   CL 95 (L) 03/06/2021   CO2 32 03/06/2021   Lab Results  Component Value Date   ALT 25 03/06/2021   AST 26 03/06/2021   ALKPHOS 176 (H) 03/06/2021   BILITOT 0.7 03/06/2021      RADIOGRAPHY: CT ABDOMEN PELVIS WO CONTRAST  Result Date: 02/21/2021 CLINICAL DATA:  Restaging gastric cancer EXAM: CT ABDOMEN AND PELVIS WITHOUT CONTRAST TECHNIQUE: Multidetector CT imaging of the abdomen and pelvis was performed following the standard protocol without IV contrast. Oral enteric contrast was administered. COMPARISON:  CT abdomen pelvis, 11/01/2020, PET-CT, 11/28/2020 FINDINGS: Lower chest: No acute abnormality.  Coronary artery calcifications. Hepatobiliary: No solid liver abnormality is seen. No gallstones, gallbladder wall thickening, or biliary dilatation. Pancreas: Unremarkable. No pancreatic ductal dilatation or surrounding inflammatory changes. Spleen: Normal in size without significant abnormality. Adrenals/Urinary Tract: Benign, fatty attenuation adenomatous thickening of the adrenal glands without discrete nodules. Kidneys are normal, without renal calculi, solid lesion, or hydronephrosis. Thickening of the decompressed urinary bladder. Stomach/Bowel: A previously noted mass of the posterior gastric antrum has been resected and or resolved (series 2, image 22). Appendix appears normal. No evidence of bowel wall thickening, distention, or inflammatory changes. Generally large burden of stool throughout the colon and rectum. Vascular/Lymphatic: No significant vascular findings are present. No enlarged abdominal or pelvic lymph nodes. Reproductive: No mass or other significant abnormality. Other: No abdominal wall hernia or abnormality. No abdominopelvic ascites. Musculoskeletal: No acute or significant osseous findings. IMPRESSION: 1. A previously noted mass of the posterior gastric antrum has been resected and  or resolved. There is no noncontrast CT evidence of residual gastric malignancy. 2. No noncontrast evidence of lymphadenopathy or metastatic disease in the abdomen or pelvis. 3. Thickening of the decompressed urinary bladder, likely related to chronic outlet obstruction. 4. Coronary artery disease. Electronically Signed   By: Eddie Candle M.D.   On: 02/21/2021 08:25       IMPRESSION/PLAN: 1. Stage I, cT2N0 Adenocarcinoma of the Gastric antrum. Dr. Lisbeth Renshaw discusses the pathology findings and reviews the nature of locally confined cancer of the stomach. Since he is not a surgical candidate, he did proceed with chemotherapy and has recovered well from this. His case was discussed in multidisciplinary  GI conference and recommendations were made for restaging endoscopy with Dr. Michail Sermon. Dr. Benay Wong also requested that at the time of the procedure, clips be placed along the site of any active visible tumor or inflamed tissue in the location of prior disease. Dr. Lisbeth Renshaw is in agreement with this and both agreed on proceeding with definitive chemoRT.  We discussed the risks, benefits, short, and long term effects of radiotherapy, as well as the curative intent, and the patient is interested in proceeding. Dr. Lisbeth Renshaw discusses the delivery and logistics of radiotherapy and anticipates a course of 5 1/2 weeks of radiotherapy. Written consent is obtained and placed in the chart, a copy was provided to the patient. He will simulate following endoscopy. We will contact Dr. Kathline Magic office as well to determine when EGD will occur.   In a visit lasting 60 minutes, greater than 50% of the time was spent face to face discussing the patient's condition, in preparation for the discussion, and coordinating the patient's care.     The above documentation reflects my direct findings during this shared patient visit. Please see the separate note by Dr. Lisbeth Renshaw on this date for the remainder of the patient's plan of  care.    Carola Rhine, [facility]   **Disclaimer: This note was dictated with voice recognition software. Similar sounding words can inadvertently be transcribed and this note may contain transcription errors which may not have been corrected upon publication of note.**

## 2021-03-12 NOTE — Progress Notes (Signed)
GI Location of Tumor / Histology: Gastric Cancer  Everson Mott presented in November 2021 with complaints of nausea, vomiting and abdominal pain.  CT Abd/Pelvis 02/20/2021: A previously noted mass of the posterior gastric antrum has been resected and or resolved. There is no noncontrast CT evidence of residual gastric malignancy.  No noncontrast evidence of lymphadenopathy or metastatic disease in the abdomen or pelvis.  PET 11/28/2020: Focal area of hypermetabolic activity about the gastric antrum may reflect site of tumor in this area.  This averages into the liver on today's study, difficult to determine exact site of origin.   Small lymph nodes in the chest with mild increased metabolic activity are nonspecific and could potentially be reactive.  Upper Endoscopy 11/05/2020: 3 non bleeding gastric ulcers in the gastric antrum erosions and erythema.  CT Abd/Pelvis 11/01/2020: there is a mass-like area in the antral pre-pyloric region of the stomach measuring approximately 6.3 x 5.6 x 5.6 cm  . This may simply represent ingested food, however, the possibility of a primary gastric neoplasm warrants consideration.   Biopsies of Stomach Ulcer 11/05/2020    Past/Anticipated interventions by surgeon, if any:  Dr. Michail Sermon -No appointment just yet.  Past/Anticipated interventions by medical oncology, if any:  Ned Card NP/Dr. Benay Spice 03/06/2021 - He has completed 6 cycles of FOLFOX. 12/12/2020-02/19/2021 -The gastric mass was not seen on the recent restaging CT scans and there is no evidence of metastatic disease in the abdomen or pelvis -Dr. Benay Spice recommends a referral to Dr. Michail Sermon for a restaging upper endoscopy and a referral to Dr. Lisbeth Renshaw for consideration of radiation. -Chemotherapy is being placed on hold.  His case will be presented at the upcoming GI tumor conference.    Weight changes, if any: Steady  Bowel/Bladder complaints, if any: Slight constipation on occasion, due to  dietary changes.  No bladder changes.  Nausea / Vomiting, if any: No  Pain issues, if any:  No. Has occasional burning sensation in his stomach.  Appetite: Really good.  SAFETY ISSUES:  Prior radiation? No  Pacemaker/ICD? No  Possible current pregnancy? n/a  Is the patient on methotrexate? No  Current Complaints/Details:

## 2021-03-12 NOTE — Addendum Note (Signed)
Encounter addended by: Cori Razor, RN on: 03/12/2021 4:31 PM  Actions taken: Flowsheet accepted

## 2021-03-12 NOTE — Progress Notes (Signed)
The proposed treatment discussed in conference is for discussion purposes only and is not a binding recommendation.  The patients have not been physically examined, or presented with their treatment options.  Therefore, final treatment plans cannot be decided.   

## 2021-03-13 NOTE — Progress Notes (Signed)
03/12/21: Notified by pathology that there is no other block of tissue that will have sufficient quantity to test. MD notified.

## 2021-03-18 MED FILL — Dexamethasone Sodium Phosphate Inj 100 MG/10ML: INTRAMUSCULAR | Qty: 1 | Status: AC

## 2021-03-19 ENCOUNTER — Other Ambulatory Visit: Payer: Self-pay | Admitting: Oncology

## 2021-03-19 ENCOUNTER — Inpatient Hospital Stay (HOSPITAL_BASED_OUTPATIENT_CLINIC_OR_DEPARTMENT_OTHER): Payer: Medicaid Other | Admitting: Oncology

## 2021-03-19 ENCOUNTER — Telehealth: Payer: Self-pay

## 2021-03-19 ENCOUNTER — Other Ambulatory Visit: Payer: Self-pay

## 2021-03-19 ENCOUNTER — Inpatient Hospital Stay: Payer: Medicaid Other

## 2021-03-19 ENCOUNTER — Telehealth: Payer: Self-pay | Admitting: Pharmacist

## 2021-03-19 VITALS — BP 112/65 | HR 97 | Temp 97.9°F | Resp 18 | Ht 70.0 in | Wt 168.1 lb

## 2021-03-19 DIAGNOSIS — C163 Malignant neoplasm of pyloric antrum: Secondary | ICD-10-CM | POA: Diagnosis not present

## 2021-03-19 DIAGNOSIS — J449 Chronic obstructive pulmonary disease, unspecified: Secondary | ICD-10-CM | POA: Diagnosis not present

## 2021-03-19 DIAGNOSIS — Z95828 Presence of other vascular implants and grafts: Secondary | ICD-10-CM

## 2021-03-19 DIAGNOSIS — I509 Heart failure, unspecified: Secondary | ICD-10-CM | POA: Diagnosis not present

## 2021-03-19 DIAGNOSIS — K5900 Constipation, unspecified: Secondary | ICD-10-CM | POA: Diagnosis not present

## 2021-03-19 DIAGNOSIS — I2510 Atherosclerotic heart disease of native coronary artery without angina pectoris: Secondary | ICD-10-CM | POA: Diagnosis not present

## 2021-03-19 DIAGNOSIS — Z452 Encounter for adjustment and management of vascular access device: Secondary | ICD-10-CM | POA: Diagnosis not present

## 2021-03-19 DIAGNOSIS — N189 Chronic kidney disease, unspecified: Secondary | ICD-10-CM | POA: Diagnosis not present

## 2021-03-19 LAB — CEA (IN HOUSE-CHCC): CEA (CHCC-In House): 5.94 ng/mL — ABNORMAL HIGH (ref 0.00–5.00)

## 2021-03-19 LAB — CMP (CANCER CENTER ONLY)
ALT: 16 U/L (ref 0–44)
AST: 17 U/L (ref 15–41)
Albumin: 3.8 g/dL (ref 3.5–5.0)
Alkaline Phosphatase: 103 U/L (ref 38–126)
Anion gap: 13 (ref 5–15)
BUN: 19 mg/dL (ref 6–20)
CO2: 34 mmol/L — ABNORMAL HIGH (ref 22–32)
Calcium: 9.3 mg/dL (ref 8.9–10.3)
Chloride: 95 mmol/L — ABNORMAL LOW (ref 98–111)
Creatinine: 1.21 mg/dL (ref 0.61–1.24)
GFR, Estimated: >60 mL/min (ref >60)
Glucose, Bld: 93 mg/dL (ref 70–99)
Potassium: 3.3 mmol/L — ABNORMAL LOW (ref 3.5–5.1)
Sodium: 142 mmol/L (ref 135–145)
Total Bilirubin: 0.6 mg/dL (ref 0.3–1.2)
Total Protein: 7.3 g/dL (ref 6.5–8.1)

## 2021-03-19 LAB — CBC WITH DIFFERENTIAL (CANCER CENTER ONLY)
Abs Immature Granulocytes: 0.02 10*3/uL (ref 0.00–0.07)
Basophils Absolute: 0 10*3/uL (ref 0.0–0.1)
Basophils Relative: 1 %
Eosinophils Absolute: 0.1 10*3/uL (ref 0.0–0.5)
Eosinophils Relative: 3 %
HCT: 39 % (ref 39.0–52.0)
Hemoglobin: 13.1 g/dL (ref 13.0–17.0)
Immature Granulocytes: 0 %
Lymphocytes Relative: 18 %
Lymphs Abs: 0.9 10*3/uL (ref 0.7–4.0)
MCH: 30.2 pg (ref 26.0–34.0)
MCHC: 33.6 g/dL (ref 30.0–36.0)
MCV: 89.9 fL (ref 80.0–100.0)
Monocytes Absolute: 1 10*3/uL (ref 0.1–1.0)
Monocytes Relative: 20 %
Neutro Abs: 3 10*3/uL (ref 1.7–7.7)
Neutrophils Relative %: 58 %
Platelet Count: 199 10*3/uL (ref 150–400)
RBC: 4.34 MIL/uL (ref 4.22–5.81)
RDW: 19.9 % — ABNORMAL HIGH (ref 11.5–15.5)
WBC Count: 5 10*3/uL (ref 4.0–10.5)
nRBC: 0 % (ref 0.0–0.2)

## 2021-03-19 MED ORDER — CAPECITABINE 500 MG PO TABS: ORAL_TABLET | ORAL | 0 refills | Status: DC

## 2021-03-19 MED ORDER — SODIUM CHLORIDE 0.9% FLUSH
10.0000 mL | INTRAVENOUS | Status: DC | PRN
  Administered 2021-03-19: 10 mL via INTRAVENOUS
  Filled 2021-03-19: qty 10

## 2021-03-19 MED ORDER — HEPARIN SOD (PORK) LOCK FLUSH 100 UNIT/ML IV SOLN
500.0000 [IU] | Freq: Once | INTRAVENOUS | Status: AC
  Administered 2021-03-19: 500 [IU] via INTRAVENOUS
  Filled 2021-03-19: qty 5

## 2021-03-19 NOTE — Telephone Encounter (Signed)
Oral Oncology Pharmacist Encounter  Received new prescription for Xeloda (capecitabine) for the treatment of gastric cancer in conjunction with radidation, planned duration ~ 5 1/2 - 6 weeks.   Prescription dose and frequency assessed for appropriateness. Appropriate for therapy initiation.   CBC w/ Diff and CMP from 03/19/21 assessed, labs stable for treatment initiation, no dose adjustments required.   Current medication list in Epic reviewed, DDIs with Xeloda identified: Category C DDI between Xeloda and Omeprazole - proton-pump inhibitors can decrease efficacy of Xeloda - will discuss with patient alternatives to omeprazole, such as H2RA's like famotidine while on Xeloda.  Category B DDI between Xeloda and Digoxin - case reports regarding possibility of decreased serum concentrations of digoxin with antineoplastic agents have been reported, attributed to damage in GI mucosa decreasing digoxin abosorption. Given minimal documented reports, no change in therapy required.  Evaluated chart and no patient barriers to medication adherence noted.   Patient agreement for Xeloda treatment documented in MD note on 03/19/21.  Prescription has been e-scribed to the [facility] for benefits analysis and approval.  Oral Oncology Clinic will continue to follow for insurance authorization, copayment issues, initial counseling and start date.  Leron Croak, PharmD, BCPS Hematology/Oncology Clinical Pharmacist [facility] [phone] 03/19/2021 12:32 PM

## 2021-03-19 NOTE — Progress Notes (Signed)
[facility] OFFICE PROGRESS NOTE   Diagnosis: Gastric cancer  INTERVAL HISTORY:   [name] returns as scheduled.  He feels well.  His only complaint is constipation.  He saw Dr. Lisbeth Wong and is being scheduled for a course of radiation to the stomach.  He has not seen Dr. Michail Wong.  Objective:  Vital signs in last 24 hours:  Blood pressure 112/65, pulse 97, temperature 97.9 F (36.6 C), resp. rate 18, height _0  (1.778 m), weight 168 lb 1.6 oz (76.2 kg), SpO2 98 %.    Resp: Lungs clear bilaterally Cardio: Regular rate and rhythm GI: No hepatosplenomegaly, no mass, nontender Vascular: No leg edema  Skin: Hyperpigmentation and skin thickening of the hands  Portacath/PICC-without erythema  Lab Results:  Lab Results  Component Value Date   WBC 5.0 03/19/2021   HGB 13.1 03/19/2021   HCT 39.0 03/19/2021   MCV 89.9 03/19/2021   PLT 199 03/19/2021   NEUTROABS 3.0 03/19/2021    CMP  Lab Results  Component Value Date   NA 135 03/06/2021   K 3.4 (L) 03/06/2021   CL 95 (L) 03/06/2021   CO2 32 03/06/2021   GLUCOSE 101 (H) 03/06/2021   BUN 19 03/06/2021   CREATININE 1.34 (H) 03/06/2021   CALCIUM 9.0 03/06/2021   PROT 7.2 03/06/2021   ALBUMIN 3.6 03/06/2021   AST 26 03/06/2021   ALT 25 03/06/2021   ALKPHOS 176 (H) 03/06/2021   BILITOT 0.7 03/06/2021   GFRNONAA >60 03/06/2021   GFRAA 55 (L) 09/21/2020    Lab Results  Component Value Date   CEA1 6.61 (H) 02/19/2021    Medications: I have reviewed the patient's current medications.   Assessment/Plan: 1. Gastric cancer  CT abdomen/pelvis 11/01/2020-masslike area measuring 6.3 x 5.6 x 5.6 cm in the antral prepyloric region of the stomach  Upper endoscopy 11/05/2020-patchy white plaques in the distal esophagus; 3 nonbleeding cratered gastric ulcers with pigmented material in the gastric antrum largest 10 mm; segmental moderate inflammation characterized by congestion, erosions and erythema in the  gastric antrum. 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  No lymphadenopathy or metastatic disease in the abdomen or pelvis. 2. CHF, LVEF 20-25% 10/04/2020 3. CAD status post prior LAD stent 4. COPD 5. Chronic kidney disease 6. Cocaine usage 7. Port-A-Cath placement 12/06/2020, Interventional Radiology 8. Mild neutropenia secondary to chemotherapy-Udenyca added with cycle 5 FOLFOX     Disposition: [name] is in clinical remission from gastric cancer.  He feels well.  He saw Dr. Lisbeth Wong.  The plan is to proceed with a course of concurrent radiation and 5-fluorouracil.  He appears to be a candidate for capecitabine.  We reviewed potential toxicities associated with capecitabine including the chance of mucositis, diarrhea, rash, hyperpigmentation, and hand/foot syndrome.  He agrees to proceed.  We will coordinate a treatment start date with Dr. Lisbeth Wong.  He will return for an office and lab visit approximately 2 weeks into the course  of radiation.  We will refer him to Dr. Michail Wong for a repeat upper endoscopy.  A Xeloda prescription was entered today.  Tyler Coder, MD  03/19/2021  11:19 AM

## 2021-03-19 NOTE — Telephone Encounter (Signed)
Oral Oncology Patient Advocate Encounter  After completing a benefits investigation, prior authorization for Xeloda is not required at this time through [facility].  Patient's copay is $3.     [place] Patient [place] Phone [phone] Fax [phone] 03/19/2021 12:26 PM

## 2021-03-19 NOTE — Telephone Encounter (Signed)
Spoke with Chander at [facility] regarding referral that was sent over on 3/10 to Dr. Michail Sermon for EGD.  She states patient is set up to be seen tomorrow 3/24 with Bryson Ha PA.  Then EGD will be scheduled after the consult.

## 2021-03-19 NOTE — Progress Notes (Signed)
Informed patient that [facility] said that the does not meet criteria for their services. He will reach out to a dentist in his area.

## 2021-03-20 ENCOUNTER — Other Ambulatory Visit: Payer: Self-pay | Admitting: Gastroenterology

## 2021-03-20 ENCOUNTER — Telehealth: Payer: Self-pay | Admitting: Radiation Oncology

## 2021-03-20 DIAGNOSIS — C169 Malignant neoplasm of stomach, unspecified: Secondary | ICD-10-CM | POA: Diagnosis not present

## 2021-03-20 DIAGNOSIS — Z1211 Encounter for screening for malignant neoplasm of colon: Secondary | ICD-10-CM | POA: Diagnosis not present

## 2021-03-20 NOTE — Telephone Encounter (Signed)
Patient will have an EGD done by Dr. Michail Sermon on April 13th. Tyler Simpson, PA stated to get Tyler Wong scheduled for his CT [facility] done 4/14-4/19/2022 time frame and then we can begin his Radiation treatments afterwards. I have called and left him a voicemail to call us back to get the CT [facility] scheduled.

## 2021-03-21 ENCOUNTER — Inpatient Hospital Stay: Payer: Medicaid Other

## 2021-03-25 ENCOUNTER — Encounter (HOSPITAL_COMMUNITY): Payer: Medicaid Other

## 2021-03-26 ENCOUNTER — Telehealth: Payer: Self-pay | Admitting: Oncology

## 2021-03-26 NOTE — Telephone Encounter (Signed)
Scheduled appt per 3/23 los - left message for patient with appt date and time

## 2021-04-01 ENCOUNTER — Other Ambulatory Visit: Payer: Self-pay | Admitting: Gastroenterology

## 2021-04-03 NOTE — Progress Notes (Signed)
Attempted to obtain medical history via telephone, unable to reach at this time. I left a voicemail to return pre surgical testing department's phone call.  

## 2021-04-07 ENCOUNTER — Telehealth (HOSPITAL_COMMUNITY): Payer: Self-pay | Admitting: Licensed Clinical Social Worker

## 2021-04-07 ENCOUNTER — Other Ambulatory Visit (HOSPITAL_COMMUNITY): Payer: Medicaid Other

## 2021-04-07 NOTE — Telephone Encounter (Signed)
Pt sister contacted CSW to express concerns with pt current state.  Says he is no longer at [facility] and is back in [place]- is worried he is using again.  CSW attempted to call pt and was unable to leave a message- texted pt instead   Awaiting response to discuss pt current location and state and see how we can support him.  Will continue to follow and assist as needed  [name], [facility] Desk#: [phone] Cell#: [phone]

## 2021-04-08 NOTE — Progress Notes (Signed)
This RN attempted to call Pt for pre-procedure call. Was unable to reach pt at number on file. Left voicemail for pt with instructions. Pt does not have a COVID test on file at this time.

## 2021-04-09 ENCOUNTER — Encounter (HOSPITAL_COMMUNITY): Payer: Self-pay | Admitting: Certified Registered Nurse Anesthetist

## 2021-04-09 ENCOUNTER — Encounter (HOSPITAL_COMMUNITY): Admission: RE | Payer: Self-pay | Source: Home / Self Care

## 2021-04-09 ENCOUNTER — Ambulatory Visit (HOSPITAL_COMMUNITY): Admission: RE | Admit: 2021-04-09 | Payer: Medicaid Other | Source: Home / Self Care | Admitting: Gastroenterology

## 2021-04-09 SURGERY — ESOPHAGOGASTRODUODENOSCOPY (EGD) WITH PROPOFOL
Anesthesia: Monitor Anesthesia Care

## 2021-04-11 ENCOUNTER — Telehealth (HOSPITAL_COMMUNITY): Payer: Self-pay | Admitting: Licensed Clinical Social Worker

## 2021-04-11 NOTE — Telephone Encounter (Signed)
CSW attempted to contact pt to check in but phone went straight to message stating that it can't receive calls at this time- unable to leave message  [name], [place] Worker [facility] Desk#: [phone] Cell#: [phone]

## 2021-04-14 ENCOUNTER — Ambulatory Visit: Payer: Medicaid Other | Admitting: Radiation Oncology

## 2021-04-14 ENCOUNTER — Inpatient Hospital Stay: Payer: Medicaid Other

## 2021-04-14 ENCOUNTER — Ambulatory Visit: Payer: Medicaid Other | Attending: Radiation Oncology | Admitting: Radiation Oncology

## 2021-04-14 ENCOUNTER — Inpatient Hospital Stay: Payer: Medicaid Other | Attending: Oncology | Admitting: Oncology

## 2021-04-16 ENCOUNTER — Other Ambulatory Visit: Payer: Self-pay | Admitting: *Deleted

## 2021-04-16 ENCOUNTER — Encounter: Payer: Self-pay | Admitting: *Deleted

## 2021-04-16 DIAGNOSIS — C163 Malignant neoplasm of pyloric antrum: Secondary | ICD-10-CM

## 2021-04-16 NOTE — Progress Notes (Signed)
RT due to begin on 4/25--per Dr. Benay Spice, needs CBC/CMP that day at [facility] and then lab/OV here 2 weeks later. Scheduling message sent. Will call patient on Friday to review his Xeloda dosing.

## 2021-04-17 NOTE — Telephone Encounter (Signed)
Oral Chemotherapy Pharmacist Encounter   Attempted to reach patient to provide update and offer for initial counseling on oral medication: Xeloda (capecitabine).   Patient's phone number is not in service. No answer or voicemail set up for patient's sister.  Leron Croak, PharmD, BCPS Hematology/Oncology Clinical Pharmacist [facility] [phone] 04/17/2021 9:25 AM

## 2021-04-21 ENCOUNTER — Inpatient Hospital Stay: Payer: Medicaid Other

## 2021-04-21 ENCOUNTER — Ambulatory Visit: Payer: Medicaid Other | Admitting: Radiation Oncology

## 2021-04-22 ENCOUNTER — Ambulatory Visit: Payer: Medicaid Other

## 2021-04-22 ENCOUNTER — Telehealth (HOSPITAL_COMMUNITY): Payer: Self-pay | Admitting: Licensed Clinical Social Worker

## 2021-04-22 NOTE — Telephone Encounter (Signed)
CSW informed by [facility] (who is helping pt with disability) that they have been unable to get a hold of pt regarding application.  CSW attempted to call pt- went straight to error message.  Sent message to pt sister to inform her that [facility] is trying to get a hold of pt and requesting she provide him with their phone number if she is able to speak with them.  Will continue to follow and assist as needed  [name], [facility] Desk#: [phone] Cell#: [phone]

## 2021-04-22 NOTE — Telephone Encounter (Signed)
Oral Chemotherapy Pharmacist Encounter   Attempted to reach patient to provide update and offer for initial counseling on oral medication: Xeloda (capecitabine).   No answer. Voicemail not setup, so unable to leave voicemail for patient to call back to discuss details of medication acquisition and initial counseling session.  Leron Croak, PharmD, BCPS Hematology/Oncology Clinical Pharmacist [facility] [phone] 04/22/2021 12:02 PM

## 2021-04-23 ENCOUNTER — Ambulatory Visit: Payer: Medicaid Other

## 2021-04-24 ENCOUNTER — Ambulatory Visit: Payer: Medicaid Other

## 2021-04-24 ENCOUNTER — Telehealth: Payer: Self-pay | Admitting: *Deleted

## 2021-04-24 ENCOUNTER — Encounter: Payer: Self-pay | Admitting: General Practice

## 2021-04-24 DIAGNOSIS — C163 Malignant neoplasm of pyloric antrum: Secondary | ICD-10-CM

## 2021-04-24 NOTE — Progress Notes (Signed)
[place] CSW Progress Notes  Spoke with patient. He had been lost to follow up  He has appointment with [facility] on Tuesday May 3 to follow up on his disability application. He confirms that he was asked to leave the shelter in [facility] due to a rule infraction (staying out overnight).  He is now living on the street in [place].  He has contacted [employer] and says shelter is full but he is on wait list.  Says he goes to [employer] at [facility] daily.  CSW will ask Congregational RN at that location to try to interact with patient for further assistance.  Advised patient that shelters generally stay full and there are no other ready options for emergency housing.  Advised him to go to [employer] and request their help in securing shelter placement.  He was also advised to work through his own circle of contacts w family/friends to see if he can stay somewhere temporarily until his disability is approved - if approved, he can then locate income appropriate housing in the local area.  Notified CSW J Uris/[facility] of patient's situation as she is also involved in his care.  He is currently enrolled in [facility] and can schedule rides as needed - he will need to let them know his pick up address.  He states that [employer]	[place] RN is working on appointments and will schedule rides as needed. Messaged Desma Paganini, Congregational RN at [employer], so she can connect with him if/when he presents at [facility] and/or link him w assigned [employer] account planner at [employer].  Patient was agreeable to this referral.  Edwyna Shell, LCSW Clinical Social Worker Phone:  [phone]

## 2021-04-24 NOTE — Telephone Encounter (Signed)
Spoke w/patient regarding being lost to follow up recently and did not come for his radiation treatment planning session. He tells RN that he had to leave the [facility] in [place] and is "basically homeless right now" in [place]. His phone had been disconnected, but is operational now. He still has contact information for transportation. He is willing to see Dr. Benay Spice to re-group on his treatment plan. Scheduling message sent. Notified CSW.

## 2021-04-25 ENCOUNTER — Ambulatory Visit: Payer: Medicaid Other

## 2021-04-25 ENCOUNTER — Encounter: Payer: Self-pay | Admitting: General Practice

## 2021-04-25 ENCOUNTER — Telehealth (HOSPITAL_COMMUNITY): Payer: Self-pay | Admitting: Licensed Clinical Social Worker

## 2021-04-25 NOTE — Progress Notes (Signed)
[place] CSW Progress Notes  Spoke w CSW J Uris ([facility]) and Desma Paganini, RN (Congregational Nurse at [employer]) re patient's situation.  Per patient, he is currently unsheltered in [place].  Has multiple complex medical needs.  Is facing 5 - 6 week course of concurrent chemoradiation per chart -  Cancer treatment plan will be finalized based on medical recommendations after planned upcoming tests.    Patient benefit from stable housing during his upcoming planned treatments.  He has no source of income at this time.  He can apply for disability and is [place] next week to get their help in applying for disability.  Patient was sent to [facility]) to seek recommendations for temporary housing - [facility] staff will do their best to generate options for patient.  Desma Paganini RN is aware that he is coming to [facility] today and will connect with him if possible.   Call made to Aron Baba, [place] homeless liaison, to ask for any available options.  Unable to reach, left VM.  Edwyna Shell, LCSW Clinical Social Worker Phone:  [phone]

## 2021-04-25 NOTE — Telephone Encounter (Signed)
CSW received update from cancer center CSW that she was able to speak with pt over the phone and that he reports he has been staying outside in [name] for the past several weeks.  CSW able to contact pt and get update directly.  He admits to current cocaine use and sleeping outdoors- has been trying to get into local shelters but no availability at this time.  CSW able to complete VISDAT over the phone with pt and submitted to [facility] to evaluate for case management services.  Pt also requested help getting to pharmacy to get his medications which he has not been taking recently.  CSW provided ride through [employer] to get to pharmacy then sent pt to [facility] to speak with staff there regarding shelter.  [facility] in [place] to inquire about shelter- they are full at this time and no waitlist being kept.  Also called [employer] and left VM to inquire about availability.    CSW will continue to follow and work in coordination with cancer center social worker to assist pt as able.  [name], LCSW Clinical Social Worker [facility] Desk#: [phone] Cell#: [phone]

## 2021-04-28 ENCOUNTER — Ambulatory Visit: Payer: Medicaid Other

## 2021-04-29 ENCOUNTER — Ambulatory Visit: Payer: Medicaid Other

## 2021-04-30 ENCOUNTER — Encounter: Payer: Self-pay | Admitting: General Practice

## 2021-04-30 ENCOUNTER — Ambulatory Visit: Payer: Medicaid Other

## 2021-04-30 NOTE — Progress Notes (Signed)
[place] CSW Progress Notes  Patient no showed for scheduled appointment at [facility] to complete required paperwork for his application for disability benefits, they are also unable to reach him by phone.  Patient's application has been received by [employer] and is under review.  He cannot be considered as a SOAR case at this point as his application is in process.  CSW received communication from [name], Scientist, research (physical sciences), that she will help him w application for shelter at [facility] in [place].  Disposition TBD, at this point patient remains unsheltered to CSWs knowledge.    Edwyna Shell, LCSW Clinical Social Worker Phone:  [phone]

## 2021-05-01 ENCOUNTER — Ambulatory Visit: Payer: Medicaid Other

## 2021-05-02 ENCOUNTER — Ambulatory Visit: Payer: Medicaid Other

## 2021-05-02 ENCOUNTER — Telehealth (HOSPITAL_COMMUNITY): Payer: Self-pay | Admitting: Licensed Clinical Social Worker

## 2021-05-02 NOTE — Telephone Encounter (Signed)
CSW attempted to call pt to check in as no one has been able to get a hold of him and are working to help him get into shelter.  Unable to reach- left message requesting return call  [name], [facility] Desk#: [phone] Cell#: [phone]

## 2021-05-05 ENCOUNTER — Ambulatory Visit: Payer: Medicaid Other

## 2021-05-05 ENCOUNTER — Inpatient Hospital Stay: Payer: Medicaid Other | Attending: Oncology

## 2021-05-05 ENCOUNTER — Inpatient Hospital Stay: Payer: Medicaid Other | Admitting: Oncology

## 2021-05-05 DIAGNOSIS — N189 Chronic kidney disease, unspecified: Secondary | ICD-10-CM | POA: Insufficient documentation

## 2021-05-05 DIAGNOSIS — I5084 End stage heart failure: Secondary | ICD-10-CM | POA: Insufficient documentation

## 2021-05-05 DIAGNOSIS — J449 Chronic obstructive pulmonary disease, unspecified: Secondary | ICD-10-CM | POA: Insufficient documentation

## 2021-05-05 DIAGNOSIS — C163 Malignant neoplasm of pyloric antrum: Secondary | ICD-10-CM | POA: Insufficient documentation

## 2021-05-05 DIAGNOSIS — I2510 Atherosclerotic heart disease of native coronary artery without angina pectoris: Secondary | ICD-10-CM | POA: Insufficient documentation

## 2021-05-05 DIAGNOSIS — Z452 Encounter for adjustment and management of vascular access device: Secondary | ICD-10-CM | POA: Insufficient documentation

## 2021-05-06 ENCOUNTER — Ambulatory Visit: Payer: Medicaid Other

## 2021-05-06 ENCOUNTER — Telehealth: Payer: Self-pay | Admitting: *Deleted

## 2021-05-06 ENCOUNTER — Encounter: Payer: Self-pay | Admitting: *Deleted

## 2021-05-06 NOTE — Telephone Encounter (Signed)
Called patient to f/u on "no show" yesterday for MD visit. No answer--left VM requesting return call. Also sent mychart message to call and left transportation number for him. Also made him aware that CSW is trying to reach him re: shelter and her contact information was in message.

## 2021-05-07 ENCOUNTER — Ambulatory Visit: Payer: Medicaid Other

## 2021-05-08 ENCOUNTER — Ambulatory Visit: Payer: Medicaid Other

## 2021-05-08 ENCOUNTER — Encounter: Payer: Self-pay | Admitting: General Practice

## 2021-05-08 NOTE — Progress Notes (Signed)
[place] CSW Progress Notes  Email from [name], [place] Nurse based at [facility]. She had been trying to identify housing support for patient at this facility given patient's significant medical needs.  Facility has tried to reach patient multiple times without success.  He does not return calls and on one occasion told intake office he had "something else to do."  Thus far, patient has not evidenced interest in this housing option, facility will not continue to pursue this on his behalf.  CSW briefed CSW [facility] from [facility] so she is aware of situation as she is also working with this client.    Edwyna Shell, LCSW Clinical Social Worker Phone:  [phone]

## 2021-05-09 ENCOUNTER — Ambulatory Visit: Payer: Medicaid Other

## 2021-05-12 ENCOUNTER — Ambulatory Visit: Payer: Medicaid Other

## 2021-05-12 NOTE — Telephone Encounter (Signed)
Oral Chemotherapy Pharmacist Encounter   Oral chemotherapy clinic will sign off at this point until patient has reestablished plans with Oncology team regarding plans for Xeloda and radiation.   Leron Croak, PharmD, BCPS Hematology/Oncology Clinical Pharmacist [facility] [phone] 05/12/2021 11:02 AM

## 2021-05-13 ENCOUNTER — Encounter (HOSPITAL_COMMUNITY): Payer: Self-pay | Admitting: Emergency Medicine

## 2021-05-13 ENCOUNTER — Ambulatory Visit: Payer: Medicaid Other

## 2021-05-13 ENCOUNTER — Other Ambulatory Visit: Payer: Self-pay

## 2021-05-13 ENCOUNTER — Inpatient Hospital Stay (HOSPITAL_COMMUNITY)
Admission: EM | Admit: 2021-05-13 | Discharge: 2021-05-21 | DRG: 291 | Disposition: A | Discharge status: Home / Self Care | Payer: Medicaid Other | Attending: Family Medicine | Admitting: Family Medicine

## 2021-05-13 ENCOUNTER — Emergency Department (HOSPITAL_COMMUNITY): Payer: Medicaid Other

## 2021-05-13 DIAGNOSIS — R0600 Dyspnea, unspecified: Secondary | ICD-10-CM | POA: Diagnosis not present

## 2021-05-13 DIAGNOSIS — I255 Ischemic cardiomyopathy: Secondary | ICD-10-CM | POA: Diagnosis present

## 2021-05-13 DIAGNOSIS — R778 Other specified abnormalities of plasma proteins: Secondary | ICD-10-CM

## 2021-05-13 DIAGNOSIS — F1410 Cocaine abuse, uncomplicated: Secondary | ICD-10-CM | POA: Diagnosis present

## 2021-05-13 DIAGNOSIS — N179 Acute kidney failure, unspecified: Secondary | ICD-10-CM | POA: Diagnosis present

## 2021-05-13 DIAGNOSIS — E785 Hyperlipidemia, unspecified: Secondary | ICD-10-CM | POA: Diagnosis present

## 2021-05-13 DIAGNOSIS — I5041 Acute combined systolic (congestive) and diastolic (congestive) heart failure: Secondary | ICD-10-CM

## 2021-05-13 DIAGNOSIS — F1490 Cocaine use, unspecified, uncomplicated: Secondary | ICD-10-CM | POA: Diagnosis present

## 2021-05-13 DIAGNOSIS — F17210 Nicotine dependence, cigarettes, uncomplicated: Secondary | ICD-10-CM | POA: Diagnosis present

## 2021-05-13 DIAGNOSIS — T502X5A Adverse effect of carbonic-anhydrase inhibitors, benzothiadiazides and other diuretics, initial encounter: Secondary | ICD-10-CM | POA: Diagnosis present

## 2021-05-13 DIAGNOSIS — C169 Malignant neoplasm of stomach, unspecified: Secondary | ICD-10-CM | POA: Diagnosis not present

## 2021-05-13 DIAGNOSIS — M79672 Pain in left foot: Secondary | ICD-10-CM | POA: Diagnosis not present

## 2021-05-13 DIAGNOSIS — I130 Hypertensive heart and chronic kidney disease with heart failure and stage 1 through stage 4 chronic kidney disease, or unspecified chronic kidney disease: Secondary | ICD-10-CM | POA: Diagnosis present

## 2021-05-13 DIAGNOSIS — R109 Unspecified abdominal pain: Secondary | ICD-10-CM | POA: Diagnosis not present

## 2021-05-13 DIAGNOSIS — Z20822 Contact with and (suspected) exposure to covid-19: Secondary | ICD-10-CM | POA: Diagnosis present

## 2021-05-13 DIAGNOSIS — N1831 Chronic kidney disease, stage 3a: Secondary | ICD-10-CM | POA: Diagnosis present

## 2021-05-13 DIAGNOSIS — J449 Chronic obstructive pulmonary disease, unspecified: Secondary | ICD-10-CM | POA: Diagnosis present

## 2021-05-13 DIAGNOSIS — I420 Dilated cardiomyopathy: Secondary | ICD-10-CM | POA: Diagnosis present

## 2021-05-13 DIAGNOSIS — K761 Chronic passive congestion of liver: Secondary | ICD-10-CM | POA: Diagnosis present

## 2021-05-13 DIAGNOSIS — T451X5A Adverse effect of antineoplastic and immunosuppressive drugs, initial encounter: Secondary | ICD-10-CM | POA: Diagnosis present

## 2021-05-13 DIAGNOSIS — M25579 Pain in unspecified ankle and joints of unspecified foot: Secondary | ICD-10-CM

## 2021-05-13 DIAGNOSIS — R079 Chest pain, unspecified: Secondary | ICD-10-CM | POA: Diagnosis not present

## 2021-05-13 DIAGNOSIS — I252 Old myocardial infarction: Secondary | ICD-10-CM

## 2021-05-13 DIAGNOSIS — I110 Hypertensive heart disease with heart failure: Secondary | ICD-10-CM | POA: Diagnosis not present

## 2021-05-13 DIAGNOSIS — Z7982 Long term (current) use of aspirin: Secondary | ICD-10-CM

## 2021-05-13 DIAGNOSIS — C762 Malignant neoplasm of abdomen: Secondary | ICD-10-CM | POA: Diagnosis present

## 2021-05-13 DIAGNOSIS — I5023 Acute on chronic systolic (congestive) heart failure: Secondary | ICD-10-CM | POA: Diagnosis not present

## 2021-05-13 DIAGNOSIS — G4700 Insomnia, unspecified: Secondary | ICD-10-CM | POA: Diagnosis present

## 2021-05-13 DIAGNOSIS — F1910 Other psychoactive substance abuse, uncomplicated: Secondary | ICD-10-CM | POA: Diagnosis present

## 2021-05-13 DIAGNOSIS — E876 Hypokalemia: Secondary | ICD-10-CM | POA: Diagnosis not present

## 2021-05-13 DIAGNOSIS — M109 Gout, unspecified: Secondary | ICD-10-CM | POA: Diagnosis not present

## 2021-05-13 DIAGNOSIS — Z8249 Family history of ischemic heart disease and other diseases of the circulatory system: Secondary | ICD-10-CM

## 2021-05-13 DIAGNOSIS — K219 Gastro-esophageal reflux disease without esophagitis: Secondary | ICD-10-CM | POA: Diagnosis present

## 2021-05-13 DIAGNOSIS — Z5901 Sheltered homelessness: Secondary | ICD-10-CM

## 2021-05-13 DIAGNOSIS — M79671 Pain in right foot: Secondary | ICD-10-CM | POA: Diagnosis not present

## 2021-05-13 DIAGNOSIS — R000 Tachycardia, unspecified: Secondary | ICD-10-CM | POA: Diagnosis present

## 2021-05-13 DIAGNOSIS — I340 Nonrheumatic mitral (valve) insufficiency: Secondary | ICD-10-CM | POA: Diagnosis present

## 2021-05-13 DIAGNOSIS — Z7984 Long term (current) use of oral hypoglycemic drugs: Secondary | ICD-10-CM

## 2021-05-13 DIAGNOSIS — M7989 Other specified soft tissue disorders: Secondary | ICD-10-CM | POA: Diagnosis not present

## 2021-05-13 DIAGNOSIS — R64 Cachexia: Secondary | ICD-10-CM | POA: Diagnosis present

## 2021-05-13 DIAGNOSIS — M19072 Primary osteoarthritis, left ankle and foot: Secondary | ICD-10-CM | POA: Diagnosis not present

## 2021-05-13 DIAGNOSIS — R7989 Other specified abnormal findings of blood chemistry: Secondary | ICD-10-CM

## 2021-05-13 DIAGNOSIS — I2510 Atherosclerotic heart disease of native coronary artery without angina pectoris: Secondary | ICD-10-CM | POA: Diagnosis present

## 2021-05-13 DIAGNOSIS — Z8616 Personal history of COVID-19: Secondary | ICD-10-CM

## 2021-05-13 DIAGNOSIS — Z6824 Body mass index (BMI) 24.0-24.9, adult: Secondary | ICD-10-CM

## 2021-05-13 DIAGNOSIS — Z9114 Patient's other noncompliance with medication regimen: Secondary | ICD-10-CM

## 2021-05-13 DIAGNOSIS — Z720 Tobacco use: Secondary | ICD-10-CM | POA: Diagnosis not present

## 2021-05-13 DIAGNOSIS — Z9119 Patient's noncompliance with other medical treatment and regimen: Secondary | ICD-10-CM

## 2021-05-13 DIAGNOSIS — M25572 Pain in left ankle and joints of left foot: Secondary | ICD-10-CM | POA: Diagnosis not present

## 2021-05-13 DIAGNOSIS — Z79899 Other long term (current) drug therapy: Secondary | ICD-10-CM

## 2021-05-13 DIAGNOSIS — R0602 Shortness of breath: Secondary | ICD-10-CM | POA: Diagnosis not present

## 2021-05-13 DIAGNOSIS — R6881 Early satiety: Secondary | ICD-10-CM | POA: Diagnosis present

## 2021-05-13 DIAGNOSIS — D701 Agranulocytosis secondary to cancer chemotherapy: Secondary | ICD-10-CM | POA: Diagnosis present

## 2021-05-13 DIAGNOSIS — E871 Hypo-osmolality and hyponatremia: Secondary | ICD-10-CM | POA: Diagnosis present

## 2021-05-13 DIAGNOSIS — Z955 Presence of coronary angioplasty implant and graft: Secondary | ICD-10-CM

## 2021-05-13 DIAGNOSIS — Z95828 Presence of other vascular implants and grafts: Secondary | ICD-10-CM

## 2021-05-13 DIAGNOSIS — I5043 Acute on chronic combined systolic (congestive) and diastolic (congestive) heart failure: Secondary | ICD-10-CM | POA: Diagnosis present

## 2021-05-13 DIAGNOSIS — Z7189 Other specified counseling: Secondary | ICD-10-CM | POA: Diagnosis not present

## 2021-05-13 DIAGNOSIS — I5084 End stage heart failure: Secondary | ICD-10-CM | POA: Diagnosis present

## 2021-05-13 DIAGNOSIS — I509 Heart failure, unspecified: Secondary | ICD-10-CM

## 2021-05-13 DIAGNOSIS — R600 Localized edema: Secondary | ICD-10-CM | POA: Diagnosis not present

## 2021-05-13 DIAGNOSIS — I517 Cardiomegaly: Secondary | ICD-10-CM | POA: Diagnosis not present

## 2021-05-13 LAB — TROPONIN I (HIGH SENSITIVITY)
Troponin I (High Sensitivity): 111 ng/L (ref <18)
Troponin I (High Sensitivity): 119 ng/L (ref <18)

## 2021-05-13 LAB — COMPREHENSIVE METABOLIC PANEL
ALT: 252 U/L — ABNORMAL HIGH (ref 0–44)
AST: 95 U/L — ABNORMAL HIGH (ref 15–41)
Albumin: 3 g/dL — ABNORMAL LOW (ref 3.5–5.0)
Alkaline Phosphatase: 233 U/L — ABNORMAL HIGH (ref 38–126)
Anion gap: 13 (ref 5–15)
BUN: 50 mg/dL — ABNORMAL HIGH (ref 6–20)
CO2: 30 mmol/L (ref 22–32)
Calcium: 8.6 mg/dL — ABNORMAL LOW (ref 8.9–10.3)
Chloride: 87 mmol/L — ABNORMAL LOW (ref 98–111)
Creatinine, Ser: 2.18 mg/dL — ABNORMAL HIGH (ref 0.61–1.24)
GFR, Estimated: 35 mL/min — ABNORMAL LOW (ref >60)
Glucose, Bld: 179 mg/dL — ABNORMAL HIGH (ref 70–99)
Potassium: 3.5 mmol/L (ref 3.5–5.1)
Sodium: 130 mmol/L — ABNORMAL LOW (ref 135–145)
Total Bilirubin: 2.2 mg/dL — ABNORMAL HIGH (ref 0.3–1.2)
Total Protein: 6.8 g/dL (ref 6.5–8.1)

## 2021-05-13 LAB — BRAIN NATRIURETIC PEPTIDE: B Natriuretic Peptide: 1976.6 pg/mL — ABNORMAL HIGH (ref 0.0–100.0)

## 2021-05-13 LAB — RAPID URINE DRUG SCREEN, HOSP PERFORMED
Amphetamines: NOT DETECTED
Barbiturates: NOT DETECTED
Benzodiazepines: NOT DETECTED
Cocaine: POSITIVE — AB
Opiates: NOT DETECTED
Tetrahydrocannabinol: NOT DETECTED

## 2021-05-13 LAB — CBC
HCT: 37.6 % — ABNORMAL LOW (ref 39.0–52.0)
Hemoglobin: 12.6 g/dL — ABNORMAL LOW (ref 13.0–17.0)
MCH: 29.2 pg (ref 26.0–34.0)
MCHC: 33.5 g/dL (ref 30.0–36.0)
MCV: 87.2 fL (ref 80.0–100.0)
Platelets: 233 10*3/uL (ref 150–400)
RBC: 4.31 MIL/uL (ref 4.22–5.81)
RDW: 15.4 % (ref 11.5–15.5)
WBC: 8.7 10*3/uL (ref 4.0–10.5)
nRBC: 0.2 % (ref 0.0–0.2)

## 2021-05-13 LAB — LIPASE, BLOOD: Lipase: 54 U/L — ABNORMAL HIGH (ref 11–51)

## 2021-05-13 LAB — RESP PANEL BY RT-PCR (FLU A&B, COVID) ARPGX2
Influenza A by PCR: NEGATIVE
Influenza B by PCR: NEGATIVE
SARS Coronavirus 2 by RT PCR: NEGATIVE

## 2021-05-13 LAB — HIV ANTIBODY (ROUTINE TESTING W REFLEX): HIV Screen 4th Generation wRfx: NONREACTIVE

## 2021-05-13 MED ORDER — IPRATROPIUM-ALBUTEROL 0.5-2.5 (3) MG/3ML IN SOLN: 3.0000 mL | Freq: Four times a day (QID) | RESPIRATORY_TRACT | Status: DC | PRN

## 2021-05-13 MED ORDER — NICOTINE 7 MG/24HR TD PT24
7.0000 mg | MEDICATED_PATCH | Freq: Every day | TRANSDERMAL | Status: DC
  Administered 2021-05-14 – 2021-05-21 (×8): 7 mg via TRANSDERMAL
  Filled 2021-05-13 (×8): qty 1

## 2021-05-13 MED ORDER — ENOXAPARIN SODIUM 40 MG/0.4ML IJ SOSY
40.0000 mg | PREFILLED_SYRINGE | INTRAMUSCULAR | Status: DC
  Administered 2021-05-13 – 2021-05-19 (×7): 40 mg via SUBCUTANEOUS
  Filled 2021-05-13 (×7): qty 0.4

## 2021-05-13 MED ORDER — FUROSEMIDE 10 MG/ML IJ SOLN
80.0000 mg | Freq: Two times a day (BID) | INTRAMUSCULAR | Status: DC
  Administered 2021-05-14 – 2021-05-17 (×7): 80 mg via INTRAVENOUS
  Filled 2021-05-13 (×7): qty 8

## 2021-05-13 MED ORDER — SODIUM CHLORIDE 0.9% FLUSH
3.0000 mL | INTRAVENOUS | Status: DC | PRN
  Administered 2021-05-15: 3 mL via INTRAVENOUS

## 2021-05-13 MED ORDER — SODIUM CHLORIDE 0.9% FLUSH
3.0000 mL | Freq: Two times a day (BID) | INTRAVENOUS | Status: DC
  Administered 2021-05-13 – 2021-05-21 (×8): 3 mL via INTRAVENOUS

## 2021-05-13 MED ORDER — SODIUM CHLORIDE 0.9 % IV SOLN: 250.0000 mL | INTRAVENOUS | Status: DC | PRN

## 2021-05-13 MED ORDER — FUROSEMIDE 10 MG/ML IJ SOLN
60.0000 mg | Freq: Once | INTRAMUSCULAR | Status: AC
  Administered 2021-05-13: 60 mg via INTRAVENOUS
  Filled 2021-05-13: qty 6

## 2021-05-13 NOTE — ED Triage Notes (Addendum)
Pt reports sob and 7/10 intermittent burning chest pain that started last night. Hx of COPD, CHF, MI with stents, abd cancer.

## 2021-05-13 NOTE — ED Provider Notes (Signed)
[facility] EMERGENCY DEPARTMENT Provider Note   CSN: [id number] Arrival date & time: 05/13/21  1533     History Chief Complaint  Patient presents with  . Chest Pain  . Shortness of Breath         Tyler Wong is a 55 y.o. male.  Patient with history of significant heart failure, stent placements, coronary artery disease, noncompliance, cocaine abuse, cardiorenal syndrome presents with intermittent chest pain nonradiating shortness of breath.  Patient is also had worsening leg swelling bilateral.  This is worsening since last night.  Patient also has abdominal cancer for which she is receiving treatment last treatment he says approximately 2 weeks ago.  Patient is on blood thinning medications.  Patient followed by local [facility] cardiology group.  Patient said his recently has been taking his Lasix.  Patient is still using cocaine.        Past Medical History:  Diagnosis Date  . CAD in native artery 07/17/14   STEMI- LAD stenosis with DES  . CHF (congestive heart failure) ([place])   . Cocaine abuse ([place])   . COPD (chronic obstructive pulmonary disease) ([place])   . Dyspnea   . Gastric cancer ([place]) 11/01/2020  . GERD (gastroesophageal reflux disease)   . Hypertension   . ST elevation myocardial infarction (STEMI) involving left anterior descending (LAD) coronary artery with complication ([place])   . Tobacco abuse 07/17/2014    Patient Active Problem List   Diagnosis Date Noted  . CHF exacerbation ([place]) 05/13/2021  . Gastric cancer ([place]) 11/29/2020  . Abdominal pain, generalized 11/05/2020  . Congestive heart failure (CHF) ([place]) 11/01/2020  . CHF (congestive heart failure) ([place]) 10/31/2020  . Palliative care by specialist   . Goals of care, counseling/discussion   . Acute systolic HF (heart failure) ([place]) 10/03/2020  . Acute on chronic systolic CHF (congestive heart failure) ([place]) 10/03/2020  . Acute renal failure superimposed on stage 3a chronic kidney disease  ([place])   . Acute respiratory disease due to COVID-19 virus 09/20/2020  . Acute respiratory failure due to COVID-19 ([place]) 09/19/2020  . Cardiorenal syndrome 06/29/2020  . 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 Acute on chronic clinical systolic heart failure ([place]) 06/22/2020  . Acute on chronic systolic (congestive) heart failure ([place]) 06/22/2020  . Palliative care encounter   . Cardiogenic shock ([place]) 04/11/2020  . CKD (chronic kidney disease) stage 3, GFR 30-59 ml/min (HCC) 04/11/2020  . Symptomatic anemia 04/09/2020  . Acute on chronic HFrEF (heart failure with reduced ejection fraction) ([place]) 05/05/2019  . HFrEF (heart failure with reduced ejection fraction) ([place]) 05/04/2019  . Acute combined systolic and diastolic congestive heart failure ([place]) 10/02/2018  . Hepatitis C 04/13/2017  . Current non-adherence to medical treatment 04/13/2017  . Acute on chronic combined systolic (congestive) and diastolic (congestive) heart failure ([place]) 04/13/2017  . HTN (hypertension) 04/12/2017  . Insomnia 01/18/2017  . Chronic combined systolic and diastolic congestive heart failure ([place]) 01/04/2017  . Hyperlipidemia 01/04/2017  . Chest pain 12/30/2016  . Coronary artery disease involving native heart 07/18/2014  . Tobacco abuse 07/17/2014  . Cocaine use 07/17/2014    Past Surgical History:  Procedure Laterality Date  . BIOPSY  11/05/2020   Procedure: BIOPSY;  Surgeon: Wilford Corner, MD;  Location: [facility];  Service: Endoscopy;;  . CORONARY ANGIOPLASTY WITH STENT PLACEMENT  07/18/14   resolute DES to LAD STEMI  . ESOPHAGOGASTRODUODENOSCOPY N/A 11/05/2020   Procedure: ESOPHAGOGASTRODUODENOSCOPY (EGD);  Surgeon: Wilford Corner, MD;  Location: [facility] ENDOSCOPY;  Service: Endoscopy;  Laterality: N/A;  . IR IMAGING GUIDED PORT INSERTION  12/06/2020  . LEFT HEART CATH N/A 07/17/2014   Procedure: LEFT HEART CATH;  Surgeon: Lennette Bihari, MD;  Location: [facility] CATH LAB;  Service:  Cardiovascular;  Laterality: N/A;  . PERCUTANEOUS CORONARY STENT INTERVENTION (PCI-S)  07/17/2014   Procedure: PERCUTANEOUS CORONARY STENT INTERVENTION (PCI-S);  Surgeon: Lennette Bihari, MD;  Location: [facility] CATH LAB;  Service: Cardiovascular;;  . RIGHT/LEFT HEART CATH AND CORONARY ANGIOGRAPHY N/A 10/03/2018   Procedure: RIGHT/LEFT HEART CATH AND CORONARY ANGIOGRAPHY;  Surgeon: Laurey Morale, MD;  Location: [facility] INVASIVE CV LAB;  Service: Cardiovascular;  Laterality: N/A;       Family History  Problem Relation Age of Onset  . Cirrhosis Father   . Heart attack Mother   . Heart attack Sister   . Heart failure Sister   . Heart attack Brother     Social History   Tobacco Use  . Smoking status: Current Every Day Smoker    Packs/day: 0.25    Years: 30.00    Pack years: 7.50    Types: Cigarettes  . Smokeless tobacco: Never Used  . Tobacco comment: .5 PK PER DAY  Vaping Use  . Vaping Use: Never used  Substance Use Topics  . Alcohol use: No    Comment: Patient denies abuse, states social drinker  . Drug use: Yes    Types: Cocaine, "Crack" cocaine    Comment: heroin    Home Medications Prior to Admission medications   Medication Sig Start Date End Date Taking? Authorizing Provider  acidophilus (RISAQUAD) CAPS capsule Take 1 capsule by mouth daily.    [provider]  albuterol (VENTOLIN HFA) 108 (90 Base) MCG/ACT inhaler Inhale 2 puffs into the lungs every 4 (four) hours as needed for wheezing or shortness of breath. 01/24/20   Grayce Sessions, NP  ASPIRIN LOW DOSE 81 MG EC tablet TAKE 1 TABLET (81 MG TOTAL) BY MOUTH DAILY (MORNING) Patient taking differently: Take 81 mg by mouth in the morning. 03/05/21   Bensimhon, Bevelyn Buckles, MD  atorvastatin (LIPITOR) 80 MG tablet Take 1 tablet (80 mg total) by mouth daily. 11/26/20   Laurey Morale, MD  BIDIL 20-37.5 MG tablet TAKE 1 TABLET BY MOUTH 3 (THREE) TIMES DAILY.(AM+NOON+BEDTIME) Patient taking differently: Take 1 tablet by  mouth 3 (three) times daily. 01/28/21   Laurey Morale, MD  capecitabine (XELODA) 500 MG tablet TAKE 3 TABS (1500 MG) BY MOUTH IN AM AND 3 TABS (1500 MG) BY MOUTH IN PM. TAKE WITHIN 30 MINUTES AFTER MEALS.TAKE ON RADIATION DAYS ONLY 03/19/21 03/19/22  Ladene Artist, MD  digoxin (LANOXIN) 0.125 MG tablet Take 1 tablet (0.125 mg total) by mouth daily. 11/26/20   Laurey Morale, MD  empagliflozin (JARDIANCE) 10 MG TABS tablet Take 1 tablet (10 mg total) by mouth daily. 11/26/20   Laurey Morale, MD  ivabradine (CORLANOR) 5 MG TABS tablet Take 1 tablet (5 mg total) by mouth 2 (two) times daily with a meal. 11/26/20   Laurey Morale, MD  omeprazole (PRILOSEC) 20 MG capsule TAKE ONE CAPSULE BY MOUTH ONCE DAILY (MORNING) Patient taking differently: Take 20 mg by mouth in the morning. 01/28/21   Grayce Sessions, NP  pantoprazole (PROTONIX) 40 MG tablet Take 1 tablet (40 mg total) by mouth 2 (two) times daily. 12/03/20 01/02/21  Robbie Lis M, PA-C  potassium chloride SA (KLOR-CON) 20 MEQ tablet  Take 2 tablets (40 mEq total) by mouth daily. 01/20/21   [name], MD  sacubitril-valsartan (ENTRESTO) 24-26 MG Take 1 tablet by mouth 2 (two) times daily. 01/20/21   [name], MD  spironolactone (ALDACTONE) 25 MG tablet Take 0.5 tablets (12.5 mg total) by mouth daily. 02/10/21 05/11/21  Rafael Bihari, FNP  torsemide (DEMADEX) 20 MG tablet Take 4 tablets (80 mg total) by mouth in the morning AND 3 tablets (60 mg total) every evening. Patient taking differently: Take 40 mg by mouth in the morning AND 60 mg every evening. 02/10/21   Rafael Bihari, FNP    Allergies    Patient has no known allergies.  Review of Systems   Review of Systems  Constitutional: Negative for chills and fever.  HENT: Negative for congestion.   Eyes: Negative for visual disturbance.  Respiratory: Positive for shortness of breath.   Cardiovascular: Positive for chest pain and leg swelling.   Gastrointestinal: Negative for abdominal pain and vomiting.  Genitourinary: Negative for dysuria and flank pain.  Musculoskeletal: Negative for back pain, neck pain and neck stiffness.  Skin: Negative for rash.  Neurological: Negative for light-headedness and headaches.    Physical Exam Updated Vital Signs BP 104/86 (BP Location: Right Arm)   Pulse (!) 112   Temp 97.9 F (36.6 C)   Resp (!) 23   Ht 5' 10.5" (1.791 m)   Wt 77.1 kg   SpO2 96%   BMI 24.05 kg/m   Physical Exam Vitals and nursing note reviewed.  Constitutional:      Appearance: He is well-developed.  HENT:     Head: Normocephalic and atraumatic.  Eyes:     General:        Right eye: No discharge.        Left eye: No discharge.     Conjunctiva/sclera: Conjunctivae normal.  Neck:     Trachea: No tracheal deviation.  Cardiovascular:     Rate and Rhythm: Regular rhythm. Tachycardia present.  Pulmonary:     Effort: Pulmonary effort is normal.     Breath sounds: Examination of the right-lower field reveals rales. Examination of the left-lower field reveals rales. Rales present.  Abdominal:     General: There is no distension.     Palpations: Abdomen is soft.     Tenderness: There is no abdominal tenderness. There is no guarding.  Musculoskeletal:     Cervical back: Normal range of motion and neck supple.     Right lower leg: Edema present.     Left lower leg: Edema present.  Skin:    General: Skin is warm.     Findings: No rash.  Neurological:     General: No focal deficit present.     Mental Status: He is alert and oriented to person, place, and time.  Psychiatric:        Behavior: Behavior is not agitated.     ED Results / Procedures / Treatments   Labs (all labs ordered are listed, but only abnormal results are displayed) Labs Reviewed  CBC - Abnormal; Notable for the following components:      Result Value   Hemoglobin 12.6 (*)    HCT 37.6 (*)    All other components within normal limits   COMPREHENSIVE METABOLIC PANEL - Abnormal; Notable for the following components:   Sodium 130 (*)    Chloride 87 (*)    Glucose, Bld 179 (*)    BUN 50 (*)  Creatinine, Ser 2.18 (*)    Calcium 8.6 (*)    Albumin 3.0 (*)    AST 95 (*)    ALT 252 (*)    Alkaline Phosphatase 233 (*)    Total Bilirubin 2.2 (*)    GFR, Estimated 35 (*)    All other components within normal limits  LIPASE, BLOOD - Abnormal; Notable for the following components:   Lipase 54 (*)    All other components within normal limits  TROPONIN I (HIGH SENSITIVITY) - Abnormal; Notable for the following components:   Troponin I (High Sensitivity) 111 (*)    All other components within normal limits  RESP PANEL BY RT-PCR (FLU A&B, COVID) ARPGX2  BRAIN NATRIURETIC PEPTIDE  RAPID URINE DRUG SCREEN, HOSP PERFORMED  TROPONIN I (HIGH SENSITIVITY)    EKG EKG Interpretation  Date/Time:  Tuesday May 13 2021 15:56:59 EDT Ventricular Rate:  111 PR Interval:  226 QRS Duration: 98 QT Interval:  318 QTC Calculation: 432 R Axis:   -77 Text Interpretation: Sinus tachycardia with 1st degree A-V block with occasional Premature ventricular complexes Biatrial enlargement Left axis deviation Minimal voltage criteria for LVH, may be normal variant ( Cornell product ) Inferior-posterior infarct , age undetermined Anterolateral infarct , age undetermined Abnormal ECG Confirmed by Elnora Morrison [phone]) on 05/13/2021 6:38:25 PM   Radiology DG Chest 2 View  Result Date: 05/13/2021 CLINICAL DATA:  Chest pain for 1 day, no known injury, initial encounter EXAM: CHEST - 2 VIEW COMPARISON:  10/31/2020 FINDINGS: Cardiac shadow is enlarged but stable. Left chest wall port is noted in satisfactory position. The lungs are well aerated bilaterally. No focal infiltrate or sizable effusion is seen. Some minimal scarring in the right upper lobe is noted. No other focal abnormality is noted. IMPRESSION: No acute abnormality noted. Stable  cardiomegaly. Electronically Signed   By: Inez Catalina M.D.   On: 05/13/2021 16:59    Procedures .Critical Care Performed by: Elnora Morrison, MD Authorized by: Elnora Morrison, MD   Critical care provider statement:    Critical care time (minutes):  40   Critical care start time:  05/13/2021 6:50 PM   Critical care end time:  05/13/2021 7:30 PM   Critical care time was exclusive of:  Separately billable procedures and treating other patients and teaching time   Critical care was necessary to treat or prevent imminent or life-threatening deterioration of the following conditions:  Cardiac failure   Critical care was time spent personally by me on the following activities:  Discussions with consultants, evaluation of patient's response to treatment, examination of patient, ordering and performing treatments and interventions, ordering and review of laboratory studies, ordering and review of radiographic studies, pulse oximetry, re-evaluation of patient's condition, obtaining history from patient or surrogate and review of old charts     Medications Ordered in ED Medications  furosemide (LASIX) injection 60 mg (60 mg Intravenous Given 05/13/21 1900)    ED Course  I have reviewed the triage vital signs and the nursing notes.  Pertinent labs & imaging results that were available during my care of the patient were reviewed by me and considered in my medical decision making (see chart for details).    MDM Rules/Calculators/A&P                          Patient with complicated cardiac history and noncompliance.  Reviewed medical records and last cardiology note and with noncompliance, significant heart failure no  advanced treatments recommended at this time.  Discussed with cardiology on-call recommends Lasix, monitoring, comfort care/hospice consult tomorrow and the heart failure team will see the patient tomorrow morning.  Discussed with hospitalist for admission.  Troponin elevated 111, has  been elevated higher in the past reviewed.  Liver function 95/252 with bilirubin 2.2 elevated to previous.  Sodium 130 likely commendation of heart failure medications.  Patient has acute renal failure BUN 50, creatinine 2.18.  Majority of patient presentation due to acute on chronic heart failure and noncompliance.  Plan for delta troponin, admission to the hospitalist.  Chest x-ray no significant pleural effusion seen.  EKG did show ST-T wave changes no acute STEMI.  Lasix IV ordered.    Final Clinical Impression(s) / ED Diagnoses Final diagnoses:  Acute combined systolic and diastolic heart failure (HCC)  Troponin level elevated  Acute dyspnea  LFT elevation  Acute renal failure, unspecified acute renal failure type [facility])    Rx / DC Orders ED Discharge Orders    None       Elnora Morrison, MD 05/13/21 [phone]

## 2021-05-13 NOTE — Progress Notes (Signed)
Pt admitted to 3e7 in NAD. Vitals and weight taken on admission. Pt oriented to call bell and unit. Belongings within reach. Pt resting.

## 2021-05-13 NOTE — Consult Note (Signed)
Cardiology Consultation:   Patient ID: Tyler Wong MRN: [id number]; DOB: November 26, 1966  Admit date: 05/13/2021 Date of Consult: 05/13/2021  PCP:  [name], NP   [facility] Providers Cardiologist:  Shelva Majestic, MD  Advanced Heart Failure:  Loralie Champagne, MD       Patient Profile:   Tyler Wong is a 55 y.o. male with a hx of advanced end-stage heart failure with gastric cancer and ongoing cocaine use who is being seen 05/13/2021 for the evaluation of decompensated acute systolic heart failure at the request of Dr. Reather Converse.  History of Present Illness:   [name] is a 55 year old male known well to the heart failure service with chronic systolic heart failure secondary to mixed ischemic and nonischemic cardiomyopathy with EF of less than 20% with mild RV dysfunction, coronary artery disease status post prior LAD stent with multiple admissions for acute on chronic heart failure with low output.  In the past he had required milrinone but he has deemed not a candidate for ongoing inotropes due to ongoing polysubstance abuse as well as poor compliance.  He is tried para medicine in the past but he was not fully cooperative.  Back in September 2021 he was diagnosed with COVID.  Previous admissions in both October as well as November he was both cocaine positive.  In November, abdominal CT revealed adenocarcinoma.  Not felt to be a candidate for surgery given his other comorbidities.  Dr. Benay Spice has been treating with FOLFOX chemotherapy.  Previous creatinine was 1.37, currently 2.18.  Here today complains of shortness of breath worsening last night.  He also have some intermittent chest pain as well as he has had in the past.  Leg swelling has been worse over the past week despite taking the torsemide.  Troponin mildly elevated at 111.  LFTs are increased with ALT 252 AST 95.  Serum sodium is 130  Currently he is quite short of breath, sitting up in bed.  He was walking around  the hallway.  Asking for ice chips.  Denies any current chest pain.  In review of Dr. Claris Gladden prior clinic note- PMH: 1. CAD: STEMI 2015 with DES to LAD.  - Cath in 10/19 showed 90% ostial OM1 and 70% PLV, no interventional target. 2. Hyperlipidemia 3. Cocaine abuse.  4. COPD: Ongoing smoking.  5. GERD 6. Gastric cancer: Intramucosal adenocarcinoma.  - FOLFOX chemotherapy 7. Chronic systolic CHF:  Mixed ischemic/nonischemic (cocaine) cardiomyopathy.  Thought to be end stage, has had low output requiring milrinone in the hospital but not candidate for LVAD, transplant, or home milrinone given ongoing cocaine abuse.  - Echo (10/21): EF 20-25%, global HK with moderate dilation, mild LVH, RV low normal systolic function, PASP 57 mmHg, moderate-severe MR, moderate-severe TR.  8. COVID-19 infection: 9/21.    Past Medical History:  Diagnosis Date  . CAD in native artery 07/17/14   STEMI- LAD stenosis with DES  . CHF (congestive heart failure) ([place])   . Cocaine abuse ([place])   . COPD (chronic obstructive pulmonary disease) ([place])   . Dyspnea   . Gastric cancer ([place]) 11/01/2020  . GERD (gastroesophageal reflux disease)   . Hypertension   . ST elevation myocardial infarction (STEMI) involving left anterior descending (LAD) coronary artery with complication ([place])   . Tobacco abuse 07/17/2014    Past Surgical History:  Procedure Laterality Date  . BIOPSY  11/05/2020   Procedure: BIOPSY;  Surgeon: Wilford Corner, MD;  Location: [facility];  Service: Endoscopy;;  .  CORONARY ANGIOPLASTY WITH STENT PLACEMENT  07/18/14   resolute DES to LAD STEMI  . ESOPHAGOGASTRODUODENOSCOPY N/A 11/05/2020   Procedure: ESOPHAGOGASTRODUODENOSCOPY (EGD);  Surgeon: Wilford Corner, MD;  Location: [place];  Service: Endoscopy;  Laterality: N/A;  . IR IMAGING GUIDED PORT INSERTION  12/06/2020  . LEFT HEART CATH N/A 07/17/2014   Procedure: LEFT HEART CATH;  Surgeon: [name], MD;  Location: [facility] CATH  LAB;  Service: Cardiovascular;  Laterality: N/A;  . PERCUTANEOUS CORONARY STENT INTERVENTION (PCI-S)  07/17/2014   Procedure: PERCUTANEOUS CORONARY STENT INTERVENTION (PCI-S);  Surgeon: [name], MD;  Location: [facility] CATH LAB;  Service: Cardiovascular;;  . RIGHT/LEFT HEART CATH AND CORONARY ANGIOGRAPHY N/A 10/03/2018   Procedure: RIGHT/LEFT HEART CATH AND CORONARY ANGIOGRAPHY;  Surgeon: [name], MD;  Location: [place] CV LAB;  Service: Cardiovascular;  Laterality: N/A;     Home Medications:  Prior to Admission medications   Medication Sig Start Date End Date Taking? Authorizing Provider  acidophilus (RISAQUAD) CAPS capsule Take 1 capsule by mouth daily.    [provider]  albuterol (VENTOLIN HFA) 108 (90 Base) MCG/ACT inhaler Inhale 2 puffs into the lungs every 4 (four) hours as needed for wheezing or shortness of breath. 01/24/20   [name], NP  ASPIRIN LOW DOSE 81 MG EC tablet TAKE 1 TABLET (81 MG TOTAL) BY MOUTH DAILY (MORNING) Patient taking differently: Take 81 mg by mouth in the morning. 03/05/21   Bensimhon, Shaune Pascal, MD  atorvastatin (LIPITOR) 80 MG tablet Take 1 tablet (80 mg total) by mouth daily. 11/26/20   [name], MD  BIDIL 20-37.5 MG tablet TAKE 1 TABLET BY MOUTH 3 (THREE) TIMES DAILY.(AM+NOON+BEDTIME) Patient taking differently: Take 1 tablet by mouth 3 (three) times daily. 01/28/21   [name], MD  capecitabine (XELODA) 500 MG tablet TAKE 3 TABS (1500 MG) BY MOUTH IN AM AND 3 TABS (1500 MG) BY MOUTH IN PM. TAKE WITHIN 30 MINUTES AFTER MEALS.TAKE ON RADIATION DAYS ONLY 03/19/21 03/19/22  Ladell Pier, MD  digoxin (LANOXIN) 0.125 MG tablet Take 1 tablet (0.125 mg total) by mouth daily. 11/26/20   [name], MD  empagliflozin (JARDIANCE) 10 MG TABS tablet Take 1 tablet (10 mg total) by mouth daily. 11/26/20   [name], MD  ivabradine (CORLANOR) 5 MG TABS tablet Take 1 tablet (5 mg total) by mouth 2 (two) times daily with  a meal. 11/26/20   [name], MD  omeprazole (PRILOSEC) 20 MG capsule TAKE ONE CAPSULE BY MOUTH ONCE DAILY (MORNING) Patient taking differently: Take 20 mg by mouth in the morning. 01/28/21   [name], NP  pantoprazole (PROTONIX) 40 MG tablet Take 1 tablet (40 mg total) by mouth 2 (two) times daily. 12/03/20 01/02/21  Lyda Jester M, PA-C  potassium chloride SA (KLOR-CON) 20 MEQ tablet Take 2 tablets (40 mEq total) by mouth daily. 01/20/21   [name], MD  sacubitril-valsartan (ENTRESTO) 24-26 MG Take 1 tablet by mouth 2 (two) times daily. 01/20/21   [name], MD  spironolactone (ALDACTONE) 25 MG tablet Take 0.5 tablets (12.5 mg total) by mouth daily. 02/10/21 05/11/21  Rafael Bihari, FNP  torsemide (DEMADEX) 20 MG tablet Take 4 tablets (80 mg total) by mouth in the morning AND 3 tablets (60 mg total) every evening. Patient taking differently: Take 40 mg by mouth in the morning AND 60 mg every evening. 02/10/21   Rafael Bihari, FNP  Inpatient Medications: Scheduled Meds:  Continuous Infusions:  PRN Meds:   Allergies:   No Known Allergies  Social History:   Social History   Socioeconomic History  . Marital status: Single    Spouse name: Not on file  . Number of children: 2  . Years of education: 103  . Highest education level: 12th grade  Occupational History  . Occupation: Landscaping  Tobacco Use  . Smoking status: Current Every Day Smoker    Packs/day: 0.25    Years: 30.00    Pack years: 7.50    Types: Cigarettes  . Smokeless tobacco: Never Used  . Tobacco comment: .5 PK PER DAY  Vaping Use  . Vaping Use: Never used  Substance and Sexual Activity  . Alcohol use: No    Comment: Patient denies abuse, states social drinker  . Drug use: Yes    Types: Cocaine, "Crack" cocaine    Comment: heroin  . Sexual activity: Yes  Other Topics Concern  . Not on file  Social History Narrative   Lives in [place] with his sister and  girlfriend. He works Aeronautical engineer work.    Social Determinants of Health   Financial Resource Strain: Not on file  Food Insecurity: Not on file  Transportation Needs: Not on file  Physical Activity: Not on file  Stress: Not on file  Social Connections: Not on file  Intimate Partner Violence: Not At Risk  . Fear of Current or Ex-Partner: No  . Emotionally Abused: No  . Physically Abused: No  . Sexually Abused: No    Family History:    Family History  Problem Relation Age of Onset  . Cirrhosis Father   . Heart attack Mother   . Heart attack Sister   . Heart failure Sister   . Heart attack Brother      ROS:  Please see the history of present illness.  No syncope no bleeding  All other ROS reviewed and negative.     Physical Exam/Data:   Vitals:   05/13/21 1600 05/13/21 1601 05/13/21 1851  BP: 103/80  104/86  Pulse: (!) 107  (!) 112  Resp: 15  (!) 23  Temp: 97.9 F (36.6 C)    SpO2: 100%  96%  Weight:  77.1 kg   Height:  5' 10.5" (1.791 m)    No intake or output data in the 24 hours ending 05/13/21 1942 Last 3 Weights 05/13/2021 03/19/2021 03/12/2021  Weight (lbs) 170 lb 168 lb 1.6 oz 166 lb  Weight (kg) 77.111 kg 76.25 kg 75.297 kg  Some encounter information is confidential and restricted. Go to Review Flowsheets activity to see all data.     Body mass index is 24.05 kg/m.  General:  Well nourished, well developed, in no acute distress HEENT: normal Lymph: no adenopathy Neck: Positive mid neck JVD Endocrine:  No thryomegaly Vascular: No carotid bruits; FA pulses 2+ bilaterally without bruits  Cardiac:  normal S1, S2; RRR; 2/6 holosystolic murmur left lower sternal border Lungs:  clear to auscultation bilaterally, no wheezing, rhonchi or rales  Abd: soft, nontender, no hepatomegaly  Ext: no edema Musculoskeletal:  No deformities, BUE and BLE strength normal and equal Skin: warm and dry  Neuro:  CNs 2-12 intact, no focal abnormalities  noted Psych:  Normal affect   EKG:  The EKG was personally reviewed and demonstrates: Sinus tachycardia, left axis deviation, nonspecific ST-T wave changes  Telemetry:  Telemetry was personally reviewed and demonstrates: Sinus tachycardia  Relevant CV Studies: ECHO 10/04/20:  1. 3 D images of the LV were obtained. . Left ventricular ejection  fraction, by estimation, is 20 to 25%. The left ventricle has severely  decreased function. The left ventricle demonstrates global hypokinesis.  The left ventricular internal cavity size  was moderately dilated. There is mild left ventricular hypertrophy. Left  ventricular diastolic parameters are consistent with Grade II diastolic  dysfunction (pseudonormalization).  2. Right ventricular systolic function is low normal. The right  ventricular size is normal. There is moderately elevated pulmonary artery  systolic pressure. The estimated right ventricular systolic pressure is  45.6 mmHg.  3. Left atrial size was moderately dilated.  4. Right atrial size was severely dilated.  5. The mitral valve is grossly normal. Moderate to severe mitral valve  regurgitation. Mild mitral stenosis.  6. Tricuspid valve regurgitation is moderate to severe.  7. The aortic valve is normal in structure. Aortic valve regurgitation is  not visualized. No aortic stenosis is present.   Laboratory Data:  High Sensitivity Troponin:   Recent Labs  Lab 05/13/21 1611  TROPONINIHS 111*     Chemistry Recent Labs  Lab 05/13/21 1611  NA 130*  K 3.5  CL 87*  CO2 30  GLUCOSE 179*  BUN 50*  CREATININE 2.18*  CALCIUM 8.6*  GFRNONAA 35*  ANIONGAP 13    Recent Labs  Lab 05/13/21 1611  PROT 6.8  ALBUMIN 3.0*  AST 95*  ALT 252*  ALKPHOS 233*  BILITOT 2.2*   Hematology Recent Labs  Lab 05/13/21 1605  WBC 8.7  RBC 4.31  HGB 12.6*  HCT 37.6*  MCV 87.2  MCH 29.2  MCHC 33.5  RDW 15.4  PLT 233   BNP Recent Labs  Lab 05/13/21 1611  BNP  1,976.6*    DDimer No results for input(s): DDIMER in the last 168 hours.   Radiology/Studies:  DG Chest 2 View  Result Date: 05/13/2021 CLINICAL DATA:  Chest pain for 1 day, no known injury, initial encounter EXAM: CHEST - 2 VIEW COMPARISON:  10/31/2020 FINDINGS: Cardiac shadow is enlarged but stable. Left chest wall port is noted in satisfactory position. The lungs are well aerated bilaterally. No focal infiltrate or sizable effusion is seen. Some minimal scarring in the right upper lobe is noted. No other focal abnormality is noted. IMPRESSION: No acute abnormality noted. Stable cardiomegaly. Electronically Signed   By: Inez Catalina M.D.   On: 05/13/2021 16:59     Assessment and Plan:   Acute on chronic systolic heart failure secondary to medication noncompliance as well as mixed ischemic/nonischemic cardiomyopathy with underlying end-stage heart failure - Cocaine also likely plays a role in his cardiomyopathy.  Multiple readmissions with cardiogenic shock, requiring milrinone in the past. - Agree with Dr. Aundra Dubin that he is not a candidate for advanced therapies or home milrinone secondary to his active use of cocaine as well as his underlying gastric cancer. - Extremely challenging social situation/social determinants of health. - During prior clinic visit his torsemide was increased to 80 mg twice a day for 2 days then back to 80 mg a.m. and 60 mg p.m.  He was also asked to start spironolactone 12.5 mg daily.  He was also asked to continue with Entresto 24/26 mg twice daily and digoxin as well as Jardiance 10 mg daily and BiDil 1 tab 3 times daily.  He has been on ivabradine 5 mg twice a day as well.  Sinus tachycardia previously noted. -No beta-blocker is  being utilized 6 secondary to low output as well as cocaine use. - We will go ahead and administer IV Lasix 80 mg twice daily and reassess fluid status. - Hold spironolactone 12.5 mg twice a day -No beta-blocker - Hold Entresto given  acute kidney injury -Unfortunately patient has adamantly declined palliative care consultations. - Chest discomfort likely secondary to underlying acute heart failure/cardiogenic shock as well as potential GI cancer.  High-sensitivity troponin is minimally elevated at 111.  No further invasive therapy is warranted.  I do not think that an echocardiogram at this time would be of increased utility.  Hepatic congestion - Secondary to acute heart failure, elevated LFTs noted.   Coronary artery disease - Prior history of anterior STEMI with DES to LAD in July 2015.  Cardiac catheterization in October 2019 showed 90% ostial OM1 and 70% posterior lateral branch.  There was no interventional target.  No anginal symptoms. - Continuing with aspirin 81 mg as well as atorvastatin 80 mg high intensity dose. - Ongoing cocaine use high risk for him.  Hyperlipidemia - On high intensity statin.  Goal LDL less than 70.  Cocaine use - Has been cocaine positive during prior hospital visits.  Continue to encourage abstinence.  High risk behavior.  Tobacco use - Continue to urged tobacco cessation especially given his underlying cardiogenic shock/end-stage heart failure/adenocarcinoma.  Chronic kidney disease stage III with acute kidney injury - BUN and creatinine are increased today secondary to low output heart failure.  Continue to monitor closely with diuresis.  Mitral regurgitation - Previously moderate to severe on recent echo secondary to functional MR from dilated cardiomyopathy.  He is not a candidate for MitraClip procedure.  Gastric cancer - Dr. Benay Spice has been following his intramucosal adenocarcinoma.  Has been on FOLFOX.  Social determinants of health - Most recent message from social work on 05/08/2021 reviewed in chart shows that [place] had been trying to identify housing support for the patient and the facilities tried to reach him multiple times without  success.  On one occasion he did call back and told the office that he had something else to do.  We will have Dr. Aundra Dubin and his team see him tomorrow for any other suggestions.  CRITICAL CARE Performed by: Candee Furbish   Total critical care time: 45 minutes  Critical care time was exclusive of separately billable procedures and treating other patients.  Critical care was necessary to treat or prevent imminent or life-threatening deterioration.  Critical care was time spent personally by me on the following activities: development of treatment plan with patient and/or surrogate as well as nursing, discussions with consultants, evaluation of patient's response to treatment, examination of patient, obtaining history from patient or surrogate, ordering and performing treatments and interventions, ordering and review of laboratory studies, ordering and review of radiographic studies, pulse oximetry and re-evaluation of patient's condition.    For questions or updates, please contact [place] Please consult [email or web address] for contact info under    Signed, Candee Furbish, MD  05/13/2021 7:42 PM

## 2021-05-13 NOTE — H&P (Signed)
History and Physical    Tyler Wong QIO:[id number] DOB: 18-Sep-1966 DOA: 05/13/2021  PCP: [name], NP Patient coming from: Home  Chief Complaint: Shortness of breath, chest pain  HPI: Tyler Wong is a 55 y.o. 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  In the ED, slightly tachycardic.  Labs showing WBC 8.7, hemoglobin 12.6 (stable), platelet count 233K.  Sodium 130, potassium 3.5, chloride 87, bicarb 30, BUN 50, creatinine 2.1 (baseline 1.2), glucose 179.  AST 95, ALT 252, alk phos 233, T bili 2.2.  Lipase 54.  BNP 1976.  EKG showing ST/T wave changes; no STEMI.  Troponin mildly elevated but stable (111 >119).  UDS positive for cocaine.  COVID and influenza PCR negative.  Chest x-ray without pulmonary edema. Patient was given IV Lasix 60 mg.  Cardiology consulted.  Patient states last night he started having chest pain and shortness of breath.  He describes it as left-sided burning chest pain associated with palpitations.  Endorsing orthopnea and bilateral lower extremity edema.  States he is taking his home medications but they are not helping.  Reports drinking "too much" water.  Reports daily cocaine use.  Denies fevers or cough.  No additional history could be obtained from him.  Review of Systems:  All systems reviewed and apart from history of presenting illness, are negative.  Past Medical History:  Diagnosis Date  . CAD in native artery 07/17/14   STEMI- LAD stenosis with DES  . CHF (congestive heart failure) ([place])   . Cocaine abuse ([place])   . COPD (chronic obstructive pulmonary disease) ([place])   . Dyspnea   . Gastric cancer ([place]) 11/01/2020  . GERD (gastroesophageal reflux  disease)   . Hypertension   . ST elevation myocardial infarction (STEMI) involving left anterior descending (LAD) coronary artery with complication ([place])   . Tobacco abuse 07/17/2014    Past Surgical History:  Procedure Laterality Date  . BIOPSY  11/05/2020   Procedure: BIOPSY;  Surgeon: Wilford Corner, MD;  Location: [place];  Service: Endoscopy;;  . CORONARY ANGIOPLASTY WITH STENT PLACEMENT  07/18/14   resolute DES to LAD STEMI  . ESOPHAGOGASTRODUODENOSCOPY N/A 11/05/2020   Procedure: ESOPHAGOGASTRODUODENOSCOPY (EGD);  Surgeon: Wilford Corner, MD;  Location: [place];  Service: Endoscopy;  Laterality: N/A;  . IR IMAGING GUIDED PORT INSERTION  12/06/2020  . LEFT HEART CATH N/A 07/17/2014   Procedure: LEFT HEART CATH;  Surgeon: [name], MD;  Location: [facility] CATH LAB;  Service: Cardiovascular;  Laterality: N/A;  . PERCUTANEOUS CORONARY STENT INTERVENTION (PCI-S)  07/17/2014   Procedure: PERCUTANEOUS CORONARY STENT INTERVENTION (PCI-S);  Surgeon: [name], MD;  Location: [facility] CATH LAB;  Service: Cardiovascular;;  . RIGHT/LEFT HEART CATH AND CORONARY ANGIOGRAPHY N/A 10/03/2018   Procedure: RIGHT/LEFT HEART CATH AND CORONARY ANGIOGRAPHY;  Surgeon: [name], MD;  Location: [place] CV LAB;  Service: Cardiovascular;  Laterality: N/A;     reports that he has been smoking cigarettes. He has a 7.50 pack-year smoking history. He has never used smokeless tobacco. He reports current drug use. Drugs: Cocaine and "Crack" cocaine. He reports that he does not drink alcohol.  No Known Allergies  Family History  Problem Relation Age of Onset  .  Cirrhosis Father   . Heart attack Mother   . Heart attack Sister   . Heart failure Sister   . Heart attack Brother     Prior to Admission medications   Medication Sig Start Date End Date Taking? Authorizing Provider  acidophilus (RISAQUAD) CAPS capsule Take 1 capsule by mouth daily.    [provider]  albuterol  (VENTOLIN HFA) 108 (90 Base) MCG/ACT inhaler Inhale 2 puffs into the lungs every 4 (four) hours as needed for wheezing or shortness of breath. 01/24/20   [name], NP  ASPIRIN LOW DOSE 81 MG EC tablet TAKE 1 TABLET (81 MG TOTAL) BY MOUTH DAILY (MORNING) Patient taking differently: Take 81 mg by mouth in the morning. 03/05/21   Bensimhon, Shaune Pascal, MD  atorvastatin (LIPITOR) 80 MG tablet Take 1 tablet (80 mg total) by mouth daily. 11/26/20   [name], MD  BIDIL 20-37.5 MG tablet TAKE 1 TABLET BY MOUTH 3 (THREE) TIMES DAILY.(AM+NOON+BEDTIME) Patient taking differently: Take 1 tablet by mouth 3 (three) times daily. 01/28/21   [name], MD  capecitabine (XELODA) 500 MG tablet TAKE 3 TABS (1500 MG) BY MOUTH IN AM AND 3 TABS (1500 MG) BY MOUTH IN PM. TAKE WITHIN 30 MINUTES AFTER MEALS.TAKE ON RADIATION DAYS ONLY 03/19/21 03/19/22  Ladell Pier, MD  digoxin (LANOXIN) 0.125 MG tablet Take 1 tablet (0.125 mg total) by mouth daily. 11/26/20   [name], MD  empagliflozin (JARDIANCE) 10 MG TABS tablet Take 1 tablet (10 mg total) by mouth daily. 11/26/20   [name], MD  ivabradine (CORLANOR) 5 MG TABS tablet Take 1 tablet (5 mg total) by mouth 2 (two) times daily with a meal. 11/26/20   [name], MD  omeprazole (PRILOSEC) 20 MG capsule TAKE ONE CAPSULE BY MOUTH ONCE DAILY (MORNING) Patient taking differently: Take 20 mg by mouth in the morning. 01/28/21   [name], NP  pantoprazole (PROTONIX) 40 MG tablet Take 1 tablet (40 mg total) by mouth 2 (two) times daily. 12/03/20 01/02/21  Lyda Jester M, PA-C  potassium chloride SA (KLOR-CON) 20 MEQ tablet Take 2 tablets (40 mEq total) by mouth daily. 01/20/21   [name], MD  sacubitril-valsartan (ENTRESTO) 24-26 MG Take 1 tablet by mouth 2 (two) times daily. 01/20/21   [name], MD  spironolactone (ALDACTONE) 25 MG tablet Take 0.5 tablets (12.5 mg total) by mouth daily. 02/10/21 05/11/21  Rafael Bihari, FNP  torsemide (DEMADEX) 20 MG tablet Take 4 tablets (80 mg total) by mouth in the morning AND 3 tablets (60 mg total) every evening. Patient taking differently: Take 40 mg by mouth in the morning AND 60 mg every evening. 02/10/21   Rafael Bihari, FNP    Physical Exam: Vitals:   05/13/21 1851 05/13/21 1900 05/13/21 2108 05/13/21 2122  BP: 104/86 121/90 105/90 108/82  Pulse: (!) 112 (!) 58 (!) 112 (!) 133  Resp: (!) 23 (!) 21 (!) 30 20  Temp:   98.8 F (37.1 C) 98.9 F (37.2 C)  TempSrc:   Oral Oral  SpO2: 96% 97% 97% 100%  Weight:    81.3 kg  Height:    _0  (1.778 m)    Physical Exam Constitutional:      General: He is not in acute distress. HENT:     Head: Normocephalic and atraumatic.  Eyes:     Extraocular Movements: Extraocular movements intact.     Conjunctiva/sclera:  Conjunctivae normal.  Neck:     Comments: JVD present Cardiovascular:     Rate and Rhythm: Regular rhythm. Tachycardia present.     Pulses: Normal pulses.     Heart sounds: Murmur heard.    Pulmonary:     Effort: Pulmonary effort is normal. No respiratory distress.     Breath sounds: Normal breath sounds. No wheezing or rales.  Abdominal:     General: Bowel sounds are normal. There is no distension.     Palpations: Abdomen is soft.     Tenderness: There is no abdominal tenderness.  Musculoskeletal:     Cervical back: Normal range of motion and neck supple.     Right lower leg: Edema present.     Left lower leg: Edema present.     Comments: +3 pitting edema of bilateral lower extremities  Neurological:     General: No focal deficit present.     Mental Status: He is alert and oriented to person, place, and time.     Labs on Admission: I have personally reviewed following labs and imaging studies  CBC: Recent Labs  Lab 05/13/21 1605  WBC 8.7  HGB 12.6*  HCT 37.6*  MCV 87.2  PLT 250   Basic Metabolic Panel: Recent Labs  Lab 05/13/21 1611  NA 130*  K 3.5  CL 87*   CO2 30  GLUCOSE 179*  BUN 50*  CREATININE 2.18*  CALCIUM 8.6*   GFR: Estimated Creatinine Clearance: 39.5 mL/min (A) (by C-G formula based on SCr of 2.18 mg/dL (H)). Liver Function Tests: Recent Labs  Lab 05/13/21 1611  AST 95*  ALT 252*  ALKPHOS 233*  BILITOT 2.2*  PROT 6.8  ALBUMIN 3.0*   Recent Labs  Lab 05/13/21 1611  LIPASE 54*   No results for input(s): AMMONIA in the last 168 hours. Coagulation Profile: No results for input(s): INR, PROTIME in the last 168 hours. Cardiac Enzymes: No results for input(s): CKTOTAL, CKMB, CKMBINDEX, TROPONINI in the last 168 hours. BNP (last 3 results) No results for input(s): PROBNP in the last 8760 hours. HbA1C: No results for input(s): HGBA1C in the last 72 hours. CBG: No results for input(s): GLUCAP in the last 168 hours. Lipid Profile: No results for input(s): CHOL, HDL, LDLCALC, TRIG, CHOLHDL, LDLDIRECT in the last 72 hours. Thyroid Function Tests: No results for input(s): TSH, T4TOTAL, FREET4, T3FREE, THYROIDAB in the last 72 hours. Anemia Panel: No results for input(s): VITAMINB12, FOLATE, FERRITIN, TIBC, IRON, RETICCTPCT in the last 72 hours. Urine analysis:    Component Value Date/Time   COLORURINE STRAW (A) 10/27/2020 2221   APPEARANCEUR CLEAR 10/27/2020 2221   LABSPEC 1.005 10/27/2020 2221   PHURINE 7.0 10/27/2020 2221   GLUCOSEU >=500 (A) 10/27/2020 2221   HGBUR NEGATIVE 10/27/2020 2221   [place] 10/27/2020 2221   KETONESUR NEGATIVE 10/27/2020 2221   PROTEINUR NEGATIVE 10/27/2020 2221   NITRITE NEGATIVE 10/27/2020 2221   LEUKOCYTESUR NEGATIVE 10/27/2020 2221    Radiological Exams on Admission: DG Chest 2 View  Result Date: 05/13/2021 CLINICAL DATA:  Chest pain for 1 day, no known injury, initial encounter EXAM: CHEST - 2 VIEW COMPARISON:  10/31/2020 FINDINGS: Cardiac shadow is enlarged but stable. Left chest wall port is noted in satisfactory position. The lungs are well aerated bilaterally.  No focal infiltrate or sizable effusion is seen. Some minimal scarring in the right upper lobe is noted. No other focal abnormality is noted. IMPRESSION: No acute abnormality noted. Stable cardiomegaly. Electronically Signed  By: Inez Catalina M.D.   On: 05/13/2021 16:59    EKG: Independently reviewed.  Sinus tachycardia.  ST/T wave changes.  No STEMI.  Assessment/Plan Principal Problem:   CHF exacerbation (HCC) Active Problems:   Tobacco abuse   Cocaine use   Coronary artery disease involving native heart   Hyperlipidemia   Acute exacerbation of chronic combined CHF/end-stage heart failure Mixed ischemic/nonischemic (cocaine) cardiomyopathy.  Followed by heart failure team and not a candidate for advanced therapies or home milrinone secondary to ongoing cocaine abuse and underlying gastric cancer. Echo done October 2021 showing EF 20 to 25%, global hypokinesis with moderate dilation, grade 2 diastolic dysfunction, mild LVH, RV low normal systolic function, PASP 65.7 mmHg, moderate to severe MR, moderate to severe TR.  He is presenting with complaints of dyspnea, orthopnea, and bilateral lower extremity edema.  Appears volume overloaded with JVD and significant peripheral edema.  BNP significantly elevated at 1976.  Chest x-ray without pulmonary edema.  Not hypoxic. -Cardiology following, appreciate recommendations.  Started on IV Lasix 80 mg twice daily.  Monitor intake and output, daily weights, renal function.  Low-sodium diet with fluid restriction.  No repeat echo per cardiology.  Palliative care consulted.  Chest pain Likely due to decompensated advanced heart failure and ongoing cocaine use.  No plan for invasive therapy.  EKG showing ST/T wave changes; no STEMI. High-sensitivity troponin slightly elevated but stable. -Continue cardiac monitoring  AKI on CKD stage II-IIIa Likely cardiorenal from decompensated heart failure.  BUN 50, creatinine 2.1 (baseline 1.2). -Continue IV diuresis  and monitor renal function closely.  Hold spironolactone and Entresto.  Avoid any other nephrotoxic agents.  Elevated LFTs Likely due to hepatic congestion from decompensated heart failure. -Continue to monitor  Mild hyponatremia Likely due to volume overload. -Continue IV diuresis and monitor sodium level closely.  COPD Stable.  No signs of acute exacerbation. -DuoNeb as needed  CAD -Resume aspirin and statin after pharmacy med rec is done.  Hyperlipidemia -Resume statin after pharmacy med rec is done.  Cocaine abuse He has been positive for cocaine during prior hospital visits as well. -Counseled to quit  Tobacco use -NicoDerm patch and counseled to quit  Gastric cancer -On chemotherapy and followed by Dr. Benay Spice  Pharmacy med rec pending.  DVT prophylaxis: Lovenox Code Status: Patient wishes to be full code. Family Communication: No family at bedside. Disposition Plan: Status is: Inpatient  Remains inpatient appropriate because:Inpatient level of care appropriate due to severity of illness   Dispo: The patient is from: Home              Anticipated d/c is to: Home              Patient currently is not medically stable to d/c.   Difficult to place patient No  Level of care: Level of care: Telemetry Cardiac   The medical decision making on this patient was of high complexity and the patient is at high risk for clinical deterioration, therefore this is a level 3 visit.  Shela Leff MD [facility]  If 7PM-7AM, please contact night-coverage [email or web address]  05/13/2021, 9:28 PM

## 2021-05-13 NOTE — ED Provider Notes (Signed)
Emergency Medicine Provider Triage Evaluation Note  Tyler Wong , a 55 y.o. male  was evaluated in triage.  Pt complains of chest pain and shortness of breath.  Pain worsened last night.  It is intermittent and staying the same.  Is also most of his chest.  It feels similar to when he had recheck previously.  He also reports worsening leg swelling over the past week despite taking Lasix as prescribed.  He has a history of previous MI, last was about 3 years ago.  He follows with [facility] cardiology.  He has a history of abdominal cancer, recently finished chemo.  He is on a blood thinner..  Review of Systems  Positive: Cp, sob Negative: Cough, fever  Physical Exam  BP 103/80 (BP Location: Left Arm)   Pulse (!) 107   Temp 97.9 F (36.6 C)   Resp 15   Ht 5' 10.5" (1.791 m)   Wt 77.1 kg   SpO2 100%   BMI 24.05 kg/m  Gen:   Awake, no distress  Resp:  Normal effort, clear lung sounds MSK:   Moves extremities without difficulty  Other:  Pitting edema bilaterally of lower extremities.  Mildly tachycardic around 110.  Medical Decision Making  Medically screening exam initiated at 4:10 PM.  Appropriate orders placed.  Humzah Harty was informed that the remainder of the evaluation will be completed by another provider, this initial triage assessment does not replace that evaluation, and the importance of remaining in the ED until their evaluation is complete.  Labs, cxr, ekg, ordered   Franchot Heidelberg, PA-C 05/13/21 1612    Sherwood Gambler, MD 05/14/21 1520

## 2021-05-14 ENCOUNTER — Ambulatory Visit: Payer: Medicaid Other

## 2021-05-14 ENCOUNTER — Inpatient Hospital Stay: Payer: Self-pay

## 2021-05-14 ENCOUNTER — Inpatient Hospital Stay (HOSPITAL_COMMUNITY): Payer: Medicaid Other

## 2021-05-14 ENCOUNTER — Telehealth (HOSPITAL_COMMUNITY): Payer: Self-pay | Admitting: Licensed Clinical Social Worker

## 2021-05-14 DIAGNOSIS — I5043 Acute on chronic combined systolic (congestive) and diastolic (congestive) heart failure: Secondary | ICD-10-CM | POA: Diagnosis not present

## 2021-05-14 LAB — COMPREHENSIVE METABOLIC PANEL
ALT: 232 U/L — ABNORMAL HIGH (ref 0–44)
AST: 81 U/L — ABNORMAL HIGH (ref 15–41)
Albumin: 3 g/dL — ABNORMAL LOW (ref 3.5–5.0)
Alkaline Phosphatase: 219 U/L — ABNORMAL HIGH (ref 38–126)
Anion gap: 13 (ref 5–15)
BUN: 46 mg/dL — ABNORMAL HIGH (ref 6–20)
CO2: 29 mmol/L (ref 22–32)
Calcium: 8.7 mg/dL — ABNORMAL LOW (ref 8.9–10.3)
Chloride: 87 mmol/L — ABNORMAL LOW (ref 98–111)
Creatinine, Ser: 2.09 mg/dL — ABNORMAL HIGH (ref 0.61–1.24)
GFR, Estimated: 37 mL/min — ABNORMAL LOW (ref >60)
Glucose, Bld: 163 mg/dL — ABNORMAL HIGH (ref 70–99)
Potassium: 3.6 mmol/L (ref 3.5–5.1)
Sodium: 129 mmol/L — ABNORMAL LOW (ref 135–145)
Total Bilirubin: 2 mg/dL — ABNORMAL HIGH (ref 0.3–1.2)
Total Protein: 6.6 g/dL (ref 6.5–8.1)

## 2021-05-14 MED ORDER — MILRINONE LACTATE IN DEXTROSE 20-5 MG/100ML-% IV SOLN
0.1250 ug/kg/min | INTRAVENOUS | Status: DC
  Administered 2021-05-14 – 2021-05-16 (×4): 0.25 ug/kg/min via INTRAVENOUS
  Administered 2021-05-17: 0.125 ug/kg/min via INTRAVENOUS
  Filled 2021-05-14 (×5): qty 100

## 2021-05-14 MED ORDER — POTASSIUM CHLORIDE CRYS ER 20 MEQ PO TBCR
40.0000 meq | EXTENDED_RELEASE_TABLET | ORAL | Status: AC
  Administered 2021-05-14 (×2): 40 meq via ORAL
  Filled 2021-05-14 (×2): qty 2

## 2021-05-14 MED ORDER — MILRINONE LACTATE IN DEXTROSE 20-5 MG/100ML-% IV SOLN: 0.2500 ug/kg/min | INTRAVENOUS | Status: DC

## 2021-05-14 MED ORDER — IVABRADINE HCL 5 MG PO TABS
5.0000 mg | ORAL_TABLET | Freq: Two times a day (BID) | ORAL | Status: DC
  Administered 2021-05-14 – 2021-05-21 (×14): 5 mg via ORAL
  Filled 2021-05-14 (×16): qty 1

## 2021-05-14 MED ORDER — SODIUM CHLORIDE 0.9% FLUSH
10.0000 mL | Freq: Two times a day (BID) | INTRAVENOUS | Status: DC
  Administered 2021-05-15 – 2021-05-16 (×3): 10 mL
  Administered 2021-05-17: 40 mL
  Administered 2021-05-18 – 2021-05-21 (×5): 10 mL

## 2021-05-14 MED ORDER — METOLAZONE 2.5 MG PO TABS
2.5000 mg | ORAL_TABLET | Freq: Once | ORAL | Status: AC
  Administered 2021-05-14: 2.5 mg via ORAL
  Filled 2021-05-14: qty 1

## 2021-05-14 MED ORDER — ASPIRIN EC 81 MG PO TBEC
81.0000 mg | DELAYED_RELEASE_TABLET | Freq: Every day | ORAL | Status: DC
  Administered 2021-05-14 – 2021-05-21 (×8): 81 mg via ORAL
  Filled 2021-05-14 (×9): qty 1

## 2021-05-14 MED ORDER — SODIUM CHLORIDE 0.9% FLUSH
10.0000 mL | INTRAVENOUS | Status: DC | PRN
Start: 2021-05-14 — End: 2021-05-21
  Administered 2021-05-21: 10 mL

## 2021-05-14 MED ORDER — POLYETHYLENE GLYCOL 3350 17 G PO PACK
17.0000 g | PACK | Freq: Every day | ORAL | Status: DC | PRN
  Administered 2021-05-14 – 2021-05-16 (×3): 17 g via ORAL
  Filled 2021-05-14 (×3): qty 1

## 2021-05-14 MED ORDER — CHLORHEXIDINE GLUCONATE CLOTH 2 % EX PADS
6.0000 | MEDICATED_PAD | Freq: Every day | CUTANEOUS | Status: DC
  Administered 2021-05-15 – 2021-05-20 (×7): 6 via TOPICAL

## 2021-05-14 NOTE — Progress Notes (Signed)
Orthopedic Tech Progress Note Patient Details:  Davon Abdelaziz 25-Sep-1966 [id number] Informed nurse patients order is for unna boots to start 5/19 and be changed mondays and thursday Patient ID: [name], male   DOB: August 14, 1966, 55 y.o.   MRN: [id number]   Chip Boer 05/14/2021, 5:19 PM

## 2021-05-14 NOTE — Progress Notes (Signed)
[facility] Progress Note  Patient: Tyler Wong    KZS:[id number]  DOA: 05/13/2021     Date of Service: the patient was seen and examined on 05/14/2021  Brief hospital course: Past medical history of CAD status post PCI, chronic systolic CHF secondary to mixed ischemic and nonischemic cardiomyopathy with EF less than 20% with mild RV dysfunction, COPD, CKD stage II-IIIa gastric cancer on chemo, hypertension, GERD, cocaine abuse presented to the ED with complaints of shortness of breath and chest pain.  Currently plan is treat with diuretics.  Assessment and Plan: Acute exacerbation of chronic combined CHF/end-stage heart failure Mixed ischemic/nonischemic (cocaine) cardiomyopathy.  Followed by heart failure team and not a candidate for advanced therapies or home milrinone secondary to ongoing cocaine abuse and underlying gastric cancer.  Cardiology currently considering PICC line placement and Primacor during the hospital stay if does not respond to IV diuresis. Cardiology following, appreciate recommendations. Started on IV Lasix 80 mg twice daily.  Monitor intake and output, daily weights, renal function.  Low-sodium diet with fluid restriction.  No repeat echo per cardiology.  Palliative care consulted.  Chest pain Likely due to decompensated advanced heart failure and ongoing cocaine use.  No plan for invasive therapy.  EKG showing ST/T wave changes; no STEMI. High-sensitivity troponin slightly elevated but stable. -Continue cardiac monitoring  AKI on CKD stage II-IIIa Likely cardiorenal from decompensated heart failure.  BUN 50, creatinine 2.1 (baseline 1.2). -Continue IV diuresis and monitor renal function closely.  Hold spironolactone and Entresto.  Avoid any other nephrotoxic agents.  Elevated LFTs Likely due to hepatic congestion from decompensated heart failure. -Continue to monitor  Mild hyponatremia Likely due to volume overload. -Continue IV diuresis and monitor  sodium level closely.  COPD Stable.  No signs of acute exacerbation. -DuoNeb as needed  CAD -Resume aspirin and statin after pharmacy med rec is done.  Hyperlipidemia -Resume statin after pharmacy med rec is done.  Cocaine abuse He has been positive for cocaine during prior hospital visits as well. -Counseled to quit  Tobacco use -NicoDerm patch and counseled to quit  Gastric cancer -On chemotherapy and followed by Dr. Benay Spice  Diet: Cardiac diet DVT Prophylaxis:   Place TED hose Start: 05/14/21 1016 enoxaparin (LOVENOX) injection 40 mg Start: 05/13/21 2245   Advance goals of care discussion: Full code  Family Communication: no family was present at bedside, at the time of interview.   Disposition:  Status is: Inpatient  Remains inpatient appropriate because:IV treatments appropriate due to intensity of illness or inability to take PO   Dispo: The patient is from: Home              Anticipated d/c is to: Home              Patient currently is not medically stable to d/c.   Difficult to place patient No  Subjective: No nausea no vomiting.  No fever no chills no chest pain.  Continues to have shortness of breath.  Also has swelling of the leg.  Physical Exam:  General: Appear in mild distress, no Rash; Oral Mucosa Clear, moist. no Abnormal Neck Mass Or lumps, Conjunctiva normal  Cardiovascular: S1 and S2 Present, no Murmur, Respiratory: increased respiratory effort, Bilateral Air entry present and bilateral  Crackles, no wheezes Abdomen: Bowel Sound present, Soft and no tenderness Extremities: bilateral Pedal edema Neurology: alert and oriented to time, place, and person affect appropriate. no new focal deficit Gait not checked due to patient safety concerns  Vitals:   05/14/21 0422 05/14/21 0755 05/14/21 1836 05/14/21 1954  BP: 91/74 104/78 (!) 114/103 103/84  Pulse: (!) 108 (!) 110  (!) 108  Resp: 20 18 20 20   Temp: 98.8 F (37.1 C) 98 F (36.7 C)   97.6 F (36.4 C)  TempSrc:  Oral  Axillary  SpO2: 100% 95%  100%  Weight: 78.3 kg     Height:        Intake/Output Summary (Last 24 hours) at 05/14/2021 2052 Last data filed at 05/14/2021 1900 Gross per 24 hour  Intake 1200 ml  Output 2700 ml  Net -1500 ml   Filed Weights   05/13/21 1601 05/13/21 2122 05/14/21 0422  Weight: 77.1 kg 81.3 kg 78.3 kg    Data Reviewed: I have personally reviewed and interpreted daily labs, tele strips, imaging. I reviewed all nursing notes, pharmacy notes, vitals, pertinent old records I have discussed plan of care as described above with RN and patient/family.  CBC: Recent Labs  Lab 05/13/21 1605  WBC 8.7  HGB 12.6*  HCT 37.6*  MCV 87.2  PLT 270   Basic Metabolic Panel: Recent Labs  Lab 05/13/21 1611 05/14/21 0440  NA 130* 129*  K 3.5 3.6  CL 87* 87*  CO2 30 29  GLUCOSE 179* 163*  BUN 50* 46*  CREATININE 2.18* 2.09*  CALCIUM 8.6* 8.7*    Studies: [place] EKG SITE RITE  Result Date: 05/14/2021 If Site Rite image not attached, placement could not be confirmed due to current cardiac rhythm.   Scheduled Meds: . aspirin EC  81 mg Oral Daily  . Chlorhexidine Gluconate Cloth  6 each Topical Daily  . enoxaparin (LOVENOX) injection  40 mg Subcutaneous Q24H  . furosemide  80 mg Intravenous BID  . ivabradine  5 mg Oral BID WC  . nicotine  7 mg Transdermal Daily  . sodium chloride flush  3 mL Intravenous Q12H   Continuous Infusions: . sodium chloride    . milrinone 0.25 mcg/kg/min (05/14/21 1835)   PRN Meds: sodium chloride, ipratropium-albuterol, polyethylene glycol, sodium chloride flush  Time spent: 35 minutes  Author: Berle Mull, MD [facility] 05/14/2021 8:52 PM  To reach On-call, see care teams to locate the attending and reach out via [email or web address]. Between 7PM-7AM, please contact night-coverage If you still have difficulty reaching the attending provider, please page the [facility] (Director on Call) for [facility] on amion for assistance.

## 2021-05-14 NOTE — Consult Note (Addendum)
Advanced Heart Failure Team Consult Note   Primary Physician: [name], NP PCP-Cardiologist:  Shelva Majestic, MD  [facility]: Dr. Aundra Dubin   Reason for Consultation: Acute on chronic systolic heart failure  HPI:    Tyler Wong is seen today for evaluation of acute on chronic systolic heart failure at the request of Dr. Marlou Porch, Cardiology.   55 y.o. 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 Also w/ h/o poor compliance. Previously referred to paramedicine but not fully cooperative. Diagnosed w/ COVID 09/19/20.   Admitted 10/21 w/ acute on chronic systolic heart failure w/ low output and AKI requiring milrinone to help w/ diuresis. He was cocaine positive. Diuresed and weaned off milrinone w/ stable co-ox at 65%. Scr had improved from 2.1>>1.7. D/c wt 159 lb. Unfortunately, his wife was also hospitalized for COVID and died while he was admitted.   Readmitted again 11/21 with marked volume overload and low output heart failure, cocaine+ again. He complained of abdominal pain, initially suspected to be 2/2 low output HF but CT showeda mass-like area in the antral pre-pyloric region of the stomach.EGD with complicated distal stomach ulcers. Pathology confirmed gastric cancer, intramucosal adenocarcinoma. Seen by oncology and not felt to be a candidate for surgery given other comorbidities.   He follows with Dr. Benay Spice and has been getting FOLFOX chemotherapy.   Last seen in the [facility] on 01/20/21. He was mildly fluid overloaded and symptomatic w/ orthopnea, after running out of his torsemide x 4 days but had just picked up refill from pharmacy day of clinic visit. ReDs Clip was 38%. He was instructed to take extra torsemide, 80 mg bid x 4 days then 80 qam/60 qpm.  Entresto 24-26 mg bid was started. Jardiance and other HF meds continued. He was instructed to f/u in 2 weeks to reassess volume status but did not follow-up.   He has now been readmitted for [name] w/ markedly fluid overload and NYHA Class IIIb symptoms + CP. He reports med compliance, but doubt truthfulness. Reports "the meds have stopped working". He is still using cocaine. UDS +. He is homeless. No longer living w/ sister. Reports he has been able to get food. He states he has completed chemo but was told he needed radiation but he declined given he does not have permanent housing.   On Admit, BNP 1,976. CXR showed cardiomegaly but no frank edema. HS trop 11>>119. Lipase mildly elevated at 54. SCr 2.18>>2.09 (baseline ~1.3). Na 129. CO2 29. WBC nl 8.7. Hgb 12.6. SBPs 91-121. Sinus tach on tele, 110s. UDS + for cocaine. ALT 232. AST 81  Admitted by IM and started on IV Lasix 80 mg bid w/ minimal UOP, only 600 cc charted overnight. Delene Loll and [place] held on admit w/ AKI.    Review of Systems: [y] = yes, [ ]  = no   . General: Weight gain [ ] ; Weight loss [ ] ; Anorexia [ ] ; Fatigue [Y ]; Fever [ ] ; Chills [ ] ; Weakness [ ]   . Cardiac: Chest pain/pressure [ Y]; Resting SOB [Y ]; Exertional SOB [ Y]; Orthopnea [Y; Pedal Edema [ Y]; Palpitations [ ] ; Syncope [ ] ; Presyncope [ ] ; Paroxysmal nocturnal dyspnea[ ]   . Pulmonary: Cough [ ] ; Wheezing[ ] ; Hemoptysis[ ] ; Sputum [ ] ; Snoring [ ]   .  GI: Vomiting[ ] ; Dysphagia[ ] ; Melena[ ] ; Hematochezia [ ] ; Heartburn[ ] ; Abdominal pain [ ] ; Constipation [ ] ; Diarrhea [ ] ; BRBPR [ ]   . GU: Hematuria[ ] ; Dysuria [ ] ; Nocturia[ ]   . Vascular: Pain in legs with walking [ ] ; Pain in feet with lying flat [ ] ; Non-healing sores [ ] ; Stroke [ ] ; TIA [ ] ; Slurred speech [ ] ;  . Neuro: Headaches[ ] ; Vertigo[ ] ; Seizures[ ] ; Paresthesias[ ] ;Blurred vision [ ] ; Diplopia [ ] ; Vision changes [ ]   . Ortho/Skin: Arthritis [ ] ; Joint pain [ ] ; Muscle pain [ ] ; Joint  swelling [ ] ; Back Pain [ ] ; Rash [ ]   . Psych: Depression[ ] ; Anxiety[ ]   . Heme: Bleeding problems [ ] ; Clotting disorders [ ] ; Anemia [ ]   . Endocrine: Diabetes [ ] ; Thyroid dysfunction[ ]   Home Medications Prior to Admission medications   Medication Sig Start Date End Date Taking? Authorizing Provider  acidophilus (RISAQUAD) CAPS capsule Take 1 capsule by mouth daily.   Yes [provider]  albuterol (VENTOLIN HFA) 108 (90 Base) MCG/ACT inhaler Inhale 2 puffs into the lungs every 4 (four) hours as needed for wheezing or shortness of breath. 01/24/20  Yes Edwards, Michelle P, NP  ASPIRIN LOW DOSE 81 MG EC tablet TAKE 1 TABLET (81 MG TOTAL) BY MOUTH DAILY (MORNING) Patient taking differently: Take 81 mg by mouth in the morning. 03/05/21  Yes Bensimhon, Shaune Pascal, MD  atorvastatin (LIPITOR) 80 MG tablet Take 1 tablet (80 mg total) by mouth daily. 11/26/20  Yes [name], MD  BIDIL 20-37.5 MG tablet TAKE 1 TABLET BY MOUTH 3 (THREE) TIMES DAILY.(AM+NOON+BEDTIME) Patient taking differently: Take 1 tablet by mouth 3 (three) times daily. 01/28/21  Yes [name], MD  digoxin (LANOXIN) 0.125 MG tablet Take 1 tablet (0.125 mg total) by mouth daily. 11/26/20  Yes [name], MD  empagliflozin (JARDIANCE) 10 MG TABS tablet Take 1 tablet (10 mg total) by mouth daily. 11/26/20  Yes [name], MD  ivabradine (CORLANOR) 5 MG TABS tablet Take 1 tablet (5 mg total) by mouth 2 (two) times daily with a meal. 11/26/20  Yes [name], MD  omeprazole (PRILOSEC) 20 MG capsule TAKE ONE CAPSULE BY MOUTH ONCE DAILY (MORNING) Patient taking differently: Take 20 mg by mouth in the morning. 01/28/21  Yes [name], NP  potassium chloride SA (KLOR-CON) 20 MEQ tablet Take 2 tablets (40 mEq total) by mouth daily. 01/20/21  Yes [name], MD  sacubitril-valsartan (ENTRESTO) 24-26 MG Take 1 tablet by mouth 2 (two) times daily. 01/20/21  Yes [name], MD  torsemide  (DEMADEX) 20 MG tablet Take 4 tablets (80 mg total) by mouth in the morning AND 3 tablets (60 mg total) every evening. Patient taking differently: Take 80 mg by mouth in the morning AND 60 mg every evening. 02/10/21  Yes Milford, Jessica M, FNP  capecitabine (XELODA) 500 MG tablet TAKE 3 TABS (1500 MG) BY MOUTH IN AM AND 3 TABS (1500 MG) BY MOUTH IN PM. TAKE WITHIN 30 MINUTES AFTER MEALS.TAKE ON RADIATION DAYS ONLY Patient not taking: Reported on 05/14/2021 03/19/21 03/19/22  Ladell Pier, MD  pantoprazole (PROTONIX) 40 MG tablet Take 1 tablet (40 mg total) by mouth 2 (two) times daily. 12/03/20 01/02/21  Consuelo Pandy, PA-C  spironolactone (ALDACTONE) 25 MG tablet Take 0.5 tablets (12.5 mg total) by mouth daily. 02/10/21 05/11/21  Rafael Bihari, FNP  Past Medical History: Past Medical History:  Diagnosis Date  . CAD in native artery 07/17/14   STEMI- LAD stenosis with DES  . CHF (congestive heart failure) ([place])   . Cocaine abuse ([place])   . COPD (chronic obstructive pulmonary disease) ([place])   . Dyspnea   . Gastric cancer ([place]) 11/01/2020  . GERD (gastroesophageal reflux disease)   . Hypertension   . ST elevation myocardial infarction (STEMI) involving left anterior descending (LAD) coronary artery with complication ([place])   . Tobacco abuse 07/17/2014    Past Surgical History: Past Surgical History:  Procedure Laterality Date  . BIOPSY  11/05/2020   Procedure: BIOPSY;  Surgeon: Wilford Corner, MD;  Location: [place];  Service: Endoscopy;;  . CORONARY ANGIOPLASTY WITH STENT PLACEMENT  07/18/14   resolute DES to LAD STEMI  . ESOPHAGOGASTRODUODENOSCOPY N/A 11/05/2020   Procedure: ESOPHAGOGASTRODUODENOSCOPY (EGD);  Surgeon: Wilford Corner, MD;  Location: [place];  Service: Endoscopy;  Laterality: N/A;  . IR IMAGING GUIDED PORT INSERTION  12/06/2020  . LEFT HEART CATH N/A 07/17/2014   Procedure: LEFT HEART CATH;  Surgeon: [name], MD;  Location: [facility] CATH LAB;   Service: Cardiovascular;  Laterality: N/A;  . PERCUTANEOUS CORONARY STENT INTERVENTION (PCI-S)  07/17/2014   Procedure: PERCUTANEOUS CORONARY STENT INTERVENTION (PCI-S);  Surgeon: [name], MD;  Location: [facility] CATH LAB;  Service: Cardiovascular;;  . RIGHT/LEFT HEART CATH AND CORONARY ANGIOGRAPHY N/A 10/03/2018   Procedure: RIGHT/LEFT HEART CATH AND CORONARY ANGIOGRAPHY;  Surgeon: [name], MD;  Location: [place] CV LAB;  Service: Cardiovascular;  Laterality: N/A;    Family History: Family History  Problem Relation Age of Onset  . Cirrhosis Father   . Heart attack Mother   . Heart attack Sister   . Heart failure Sister   . Heart attack Brother     Social History: Social History   Socioeconomic History  . Marital status: Single    Spouse name: Not on file  . Number of children: 2  . Years of education: 73  . Highest education level: 12th grade  Occupational History  . Occupation: Landscaping  Tobacco Use  . Smoking status: Current Every Day Smoker    Packs/day: 0.25    Years: 30.00    Pack years: 7.50    Types: Cigarettes  . Smokeless tobacco: Never Used  . Tobacco comment: .5 PK PER DAY  Vaping Use  . Vaping Use: Never used  Substance and Sexual Activity  . Alcohol use: No    Comment: Patient denies abuse, states social drinker  . Drug use: Yes    Types: Cocaine, "Crack" cocaine    Comment: heroin  . Sexual activity: Yes  Other Topics Concern  . Not on file  Social History Narrative   Lives in [place] with his sister and girlfriend. He works Aeronautical engineer work.    Social Determinants of Health   Financial Resource Strain: Not on file  Food Insecurity: Not on file  Transportation Needs: Not on file  Physical Activity: Not on file  Stress: Not on file  Social Connections: Not on file    Allergies:  No Known Allergies  Objective:    Vital Signs:   Temp:  [97.9 F (36.6 C)-98.9 F (37.2 C)] 98 F (36.7 C) (05/18  0755) Pulse Rate:  [58-133] 110 (05/18 0755) Resp:  [15-30] 18 (05/18 0755) BP: (91-121)/(74-90) 104/78 (05/18 0755) SpO2:  [94 %-100 %] 95 % (05/18 0755) Weight:  [77.1 kg-81.3 kg]  78.3 kg (05/18 0422)    Weight change: Filed Weights   05/13/21 1601 05/13/21 2122 05/14/21 0422  Weight: 77.1 kg 81.3 kg 78.3 kg    Intake/Output:   Intake/Output Summary (Last 24 hours) at 05/14/2021 0920 Last data filed at 05/14/2021 0428 Gross per 24 hour  Intake 480 ml  Output 600 ml  Net -120 ml      Physical Exam    General:  Fatigued appearing. No resp difficulty HEENT: normal Neck: supple. Neck veins distended, JVP elevated to ear . Carotids 2+ bilat; no bruits. No lymphadenopathy or thyromegaly appreciated. Cor: PMI nondisplaced. Regular rhythm, tachy rate. 3/6 MR murmur at apex Lungs: clear Abdomen: soft, nontender, nondistended. No hepatosplenomegaly. No bruits or masses. Good bowel sounds. Extremities: no cyanosis, clubbing, rash, 2+ bilateral LEE edema Neuro: alert & orientedx3, cranial nerves grossly intact. moves all 4 extremities w/o difficulty. Affect pleasant   Telemetry   Sinus tach 110s   EKG    Sinus tach 111, PVCs, LAD   Labs   Basic Metabolic Panel: Recent Labs  Lab 05/13/21 1611 05/14/21 0440  NA 130* 129*  K 3.5 3.6  CL 87* 87*  CO2 30 29  GLUCOSE 179* 163*  BUN 50* 46*  CREATININE 2.18* 2.09*  CALCIUM 8.6* 8.7*    Liver Function Tests: Recent Labs  Lab 05/13/21 1611 05/14/21 0440  AST 95* 81*  ALT 252* 232*  ALKPHOS 233* 219*  BILITOT 2.2* 2.0*  PROT 6.8 6.6  ALBUMIN 3.0* 3.0*   Recent Labs  Lab 05/13/21 1611  LIPASE 54*   No results for input(s): AMMONIA in the last 168 hours.  CBC: Recent Labs  Lab 05/13/21 1605  WBC 8.7  HGB 12.6*  HCT 37.6*  MCV 87.2  PLT 233    Cardiac Enzymes: No results for input(s): CKTOTAL, CKMB, CKMBINDEX, TROPONINI in the last 168 hours.  BNP: BNP (last 3 results) Recent Labs     10/31/20 1604 11/12/20 1054 05/13/21 1611  BNP 1,322.2* 970.2* 1,976.6*    ProBNP (last 3 results) No results for input(s): PROBNP in the last 8760 hours.   CBG: No results for input(s): GLUCAP in the last 168 hours.  Coagulation Studies: No results for input(s): LABPROT, INR in the last 72 hours.   Imaging   DG Chest 2 View  Result Date: 05/13/2021 CLINICAL DATA:  Chest pain for 1 day, no known injury, initial encounter EXAM: CHEST - 2 VIEW COMPARISON:  10/31/2020 FINDINGS: Cardiac shadow is enlarged but stable. Left chest wall port is noted in satisfactory position. The lungs are well aerated bilaterally. No focal infiltrate or sizable effusion is seen. Some minimal scarring in the right upper lobe is noted. No other focal abnormality is noted. IMPRESSION: No acute abnormality noted. Stable cardiomegaly. Electronically Signed   By: Inez Catalina M.D.   On: 05/13/2021 16:59      Medications:     Current Medications: . enoxaparin (LOVENOX) injection  40 mg Subcutaneous Q24H  . furosemide  80 mg Intravenous BID  . nicotine  7 mg Transdermal Daily  . sodium chloride flush  3 mL Intravenous Q12H     Infusions: . sodium chloride        Assessment/Plan   1. Acute on Chronic Systolic Heart Failure - Echo in 2015 with EF 40-45%, thought to be ischemic cardiomyopathy. Cath in 10/19 showed 90% ostial OM1 and 70% PLV, no interventional target. Suspect mixed ischemic/nonischemic cardiomyopathy. Recent Echo 10/21 showed EF 20-25% with mild  RV dysfunction.He has a strong family history of CHF/cardiomyopathy, so may be a component of familial cardiomyopathy.Cocaine likely plays a role in his cardiomyopathy.Multiple re-admits forcardiogenic shockin the last 12 months requiring milrinone. Unfortunately, not a candidate for advanced therapies or home milrinone with active use of cocaine and newly diagnosed gastric cancer. Now readmitted w/ [name] w/ marked fluid overload w/ NYHA  Class IIIb symptoms in the setting of cocaine use and questionable med compliance. Diureses sluggish but normal CO2 and warm distal extremities not c/w low output.  - Continue IV Lasix 80 mg bid and Add 2.5 of metolazone to augment diuresis. If poor response, can transition to lasix gtt.  - Given social situation and ongoing substance abuse he is not a candidate for any advanced therapies including home milrinone - Agree w/ holding Entresto, spiro and Jardiance for now w/ AKI  - no digoxin w/ poor compliance  - hold Bidil for now w/ low BP  - add TED hoses - he is end stage HF w/ limited medical options. We have previously recommended palliative care but he has been resistant and declined palliative care discussions    2. AKI  - SCr 2.1 (baseline ~1.3). Likely from volume overload. Doubt low output w/ normal CO2 and warm distal extremities.  - Continue IV Lasix and add 2.5 of metolazone to augment diuresis - Hold Entresto, spiro and Jardiance  - follow BMP   3. Chest Pain w/ Known CAD  - HS trop 111>>119, likely 2/2 fluid overload +/- coronary spasm w/ cocaine use - LHC 2019 w/ Nondominant RCA providing a PLV with 70% stenosis.  90% ostial moderate OM1 - c/w medical management of CAD. Not candidate for repeat cath/ PCI given poor med compliance and inability to adhere to strict DAPT  - diurese w/ IV Lasix - needs to quit cocaine use  4. Hypervolemic Hyponatremia - Na 129  - continue diuresis   5. Cocaine Use - ongoing issue. UDS + on admit - advised to quit   6. Gastric Cancer  - He follows with Dr. Benay Spice and reports completing FOLFOX chemotherapy - Radiation therapy recommended but pt declined due to lack of permanent housing   7. Homelessness  - I have notified our SW team to help w/ shelter placement   8. Elevated LFTs - AST 81, ALT 232 - likely due to hepatic congestion from CHF - follow w/ diuresis  9. Tobacco Use - advised to quit smoking    Length of Stay:  [place], PA-C  05/14/2021, 9:20 AM  Advanced Heart Failure Team Pager [phone] (M-F; 7a - 5p)  Please contact [facility] for night-coverage after hours (4p -7a ) and weekends on [email or web address]  Patient seen with PA, agree with the above note.    Patient has history of mixed ischemic/nonischemic cardiomyopathy with end stage HF.  Not candidate for advanced therapies with cocaine abuse, homelessness, and gastric cancer.    He says he takes his meds "sometimes."  Says he felt "bad" for about a week prior to admission with worsening dyspnea.  Cocaine + again and homeless. Complains of early satiety.   General: NAD Neck: JVP 16 cm, no thyromegaly or thyroid nodule.  Lungs: Clear to auscultation bilaterally with normal respiratory effort. CV: Lateral PMI.  Heart regular S1/S2, no S3/S4, 3/6 HSM apex.  2+ edema to knees. Abdomen: Soft, nontender, no hepatosplenomegaly, no distention.  Skin: Intact without lesions or rashes.  Neurologic: Alert and oriented x 3.  Psych: Normal affect. Extremities: No clubbing or cyanosis.  HEENT: Normal.   1. Acute on chronic systolic HF:  Anterior MI in 2015, DES to LAD.  Echo in 2015 with EF 40-45%. Cath in 10/19 showed 90% ostial OM1 and 70% PLV, no interventional target. 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 Last Echo 10/21 showed EF 20-25% with mild RV dysfunction.He has a strong family history of CHF/cardiomyopathy, so may be a component of familial cardiomyopathy.Cocaine likely plays a role in his cardiomyopathy.Multiple re-admits forcardiogenic shock requiring milrinone. Unfortunately, not a candidate for advanced therapies or home milrinone with active use of cocaine, homelessness, and gastric cancer. He has end-stage HF.  On exam today, he is volume overloaded and uncomfortable.  Creatinine up to 2.09.  He has been sporadic with his meds and is cocaine+.  Suspect recurrent low output HF with volume overload.  -  Given social situation, gastric cancer, and ongoing substance abuse,  he is not a candidate for any advanced therapies including home milrinone - Lasix 80 mg IV bid + metolazone 2.5 today and follow response.  - Can continue home ivabradine 5 mg tid.  - Hold Entresto, digoxin, Jardiance, Bidil, and spironolactone with soft BP (SBP 90s-100s) and elevated creatinine.  Suspect he has not been compliant with these meds at home.   - Place PICC line and follow CVP and co-ox.  If co-ox is low, will use milrinone gtt while in hospital to help with diuresis but he cannot go home on milrinone.  - No ? blocker w/ h/o low output and cocaine use 2. CAD: H/o anterior STEMI with DES to LAD in 7/15. Cath in 10/19 with 90% ostial OM1 and 70% PLV, no interventional target. No chest pain currently.  HS-TnI mildly elevated with no trend, suspect demand ischemia with volume overload and low output.  -ContinueASA 81 and atorvastatin 80 mg daily.  - needs to refrain from using cocaine 3. Cocaine abuse:Has been +cocaine each hospital visit.  Encouraged abstinence.  4. Active smoker: Urged cessation.  5. AKI on CKD Stage 3:  Cardiorenal syndrome, may need milrinone.  6. Mitral Regurgitation: Moderate to severe on 10/21 echo, suspect functional MR from dilated CMP.  - Not great Mitraclip candidate with cocaine abuse and compliance issues.  7. Gastric Cancer: Intramucosal adenocarcinoma. Has had FOLFOX chemo.  Was supposed to get radiation but has not been compliant with followup. Will discuss prior to discharge.  8. Elevated LFTs: Suspect congestive hepatopathy.  9. Hyponatremia: Hypervolemic hyponatremia.  Follow with diuresis.   Loralie Champagne 05/14/2021 11:04 AM

## 2021-05-14 NOTE — Progress Notes (Signed)
Interval progress note   Pt making urine but diuresis sluggish despite addition of metolazone.   PICC placed but malpositioned on CXR. Not ready for use. IV team team aware and plans to reposition.   In the interim, will start milrinone peripherally at 0.25 mcg/kg/min. Transition to gtt to PICC and check Co-ox once able to use. D/w RN.   Lyda Jester, PA-C

## 2021-05-14 NOTE — Telephone Encounter (Signed)
Outpatient HF CSW met with pt to check in as myself and [place] had been trying to work with pt on shelter placement in the outpatient setting.  Pt reports he is doing poorly at this time.  Has been using regularly but states he needs help and wants to get clean again and re-establish with the cancer center so he can prioritize getting treatment.  Is agreeable to CSW reaching back out to [employer] who had been trying to reach patient for an intake while outpatient.  CSW informed Immunologist that pt is currently at the hospital and we would be available to assist in completing intake process so he could be considered for immediate shelter placement due to his medical needs- they will call me tomorrow to discuss  CSW also informed Anderson Malta with [facility] regarding inpatient stay as they had not been able to reach pt to continue working on disability process.  Will continue to follow in outpatient setting and assist as needed  [name], [facility] Desk#: [phone] Cell#: [phone]

## 2021-05-14 NOTE — Progress Notes (Signed)
Peripherally Inserted Central Catheter Placement  The IV Nurse has discussed with the patient and/or persons authorized to consent for the patient, the purpose of this procedure and the potential benefits and risks involved with this procedure.  The benefits include less needle sticks, lab draws from the catheter, and the patient may be discharged home with the catheter. Risks include, but not limited to, infection, bleeding, blood clot (thrombus formation), and puncture of an artery; nerve damage and irregular heartbeat and possibility to perform a PICC exchange if needed/ordered by physician.  Alternatives to this procedure were also discussed.  Bard Power PICC patient education guide, fact sheet on infection prevention and patient information card has been provided to patient /or left at bedside.    PICC Placement Documentation  PICC Double Lumen 05/14/21 PICC Right Brachial 37 cm 0 cm (Active)  Indication for Insertion or Continuance of Line Vasoactive infusions 05/14/21 1616  Exposed Catheter (cm) 0 cm 05/14/21 1616  Site Assessment Clean;Dry;Intact 05/14/21 1616  Lumen #1 Status Flushed;Saline locked;Blood return noted 05/14/21 1616  Lumen #2 Status Flushed;Saline locked;Blood return noted 05/14/21 1616  Dressing Type Transparent;Securing device 05/14/21 1616  Dressing Status Clean;Dry;Intact 05/14/21 1616  Antimicrobial disc in place? Yes 05/14/21 1616  Dressing Intervention New dressing 05/14/21 1616  Dressing Change Due 05/21/21 05/14/21 [place], L[place] Hall 05/14/2021, 4:18 PM

## 2021-05-14 NOTE — Progress Notes (Signed)
PICC tip in L innominate per CXR. Powerflushed x 20cc NS. Pt sat up to 70 degrees  In bed. Primary RN instructed to leave patient in that position until 2015. Will reorder PCXR then to see if PICC was changed by powerflush.

## 2021-05-14 NOTE — TOC Initial Note (Addendum)
Transition of Care (TOC) - Initial/Assessment Note  Heart Failure   Patient Details  Name: Tyler Wong MRN: [id number] Date of Birth: [birth year]  Transition of Care [facility]) CM/SW Contact:    [place], [place] Phone Number: 05/14/2021, 11:22 AM  Clinical Narrative:     CSW spoke with the patient at bedside and completed very brief SDOH screening with the patient who reported that he is homeless, doesn't have transportation, he does get food stamps and he is connected with the [facility] outpatient clinic and has been working with the outpatient [place] social worker for shelter options. Patient reported not feeling well and didn't engage much with the social worker and pretended to be asleep throughout the conversation. CSW asked patient about talking at a different time so he could get some rest and patient reported no he can hear. CSW asked patient how they could best support him and patient reported "I don't know." CSW provided the patient with the social workers name, number and position and if they identify other needs to please reach out so that CSW can provide support.   CSW will connect with outpatient social worker to follow up on needed support.  TOC will continue to follow for discharge needs.                 Barriers to Discharge: Continued Medical Work up,Homeless with medical needs   Patient Goals and CMS Choice        Expected Discharge Plan and Services   In-house Referral: Clinical Social Work     Living arrangements for the past 2 months: No permanent address,Homeless                                      Prior Living Arrangements/Services Living arrangements for the past 2 months: No permanent address,Homeless Lives with:: Self Patient language and need for interpreter reviewed:: Yes        Need for Family Participation in Patient Care: No (Comment) Care giver support system in place?: No (comment)   Criminal Activity/Legal Involvement Pertinent to Current  Situation/Hospitalization: No - Comment as needed  Activities of Daily Living Home Assistive Devices/Equipment: None ADL Screening (condition at time of admission) Patient's cognitive ability adequate to safely complete daily activities?: Yes Is the patient deaf or have difficulty hearing?: No Does the patient have difficulty seeing, even when wearing glasses/contacts?: No Does the patient have difficulty concentrating, remembering, or making decisions?: No Patient able to express need for assistance with ADLs?: Yes Does the patient have difficulty dressing or bathing?: No Independently performs ADLs?: Yes (appropriate for developmental age) Does the patient have difficulty walking or climbing stairs?: No Weakness of Legs: None Weakness of Arms/Hands: None  Permission Sought/Granted                  Emotional Assessment Appearance:: Appears stated age Attitude/Demeanor/Rapport: Guarded Affect (typically observed): Quiet Orientation: : Oriented to Self,Oriented to Place,Oriented to  Time,Oriented to Situation Alcohol / Substance Use: Illicit Drugs Psych Involvement: No (comment)  Admission diagnosis:  Acute combined systolic and diastolic heart failure (HCC) [I50.41] CHF exacerbation (HCC) [I50.9] Troponin level elevated [R77.8] LFT elevation [R79.89] Acute dyspnea [R06.00] Acute renal failure, unspecified acute renal failure type [facility]) [N17.9] Patient Active Problem List   Diagnosis Date Noted  . CHF exacerbation ([place]) 05/13/2021  . Gastric cancer ([place]) 11/29/2020  . Abdominal pain, generalized 11/05/2020  . Congestive  heart failure (CHF) ([place]) 11/01/2020  . CHF (congestive heart failure) ([place]) 10/31/2020  . Palliative care by specialist   . Goals of care, counseling/discussion   . Acute systolic HF (heart failure) ([place]) 10/03/2020  . Acute on chronic systolic CHF (congestive heart failure) ([place]) 10/03/2020  . Acute renal failure superimposed on stage 3a chronic  kidney disease ([place])   . Acute respiratory disease due to COVID-19 virus 09/20/2020  . Acute respiratory failure due to COVID-19 ([place]) 09/19/2020  . Cardiorenal syndrome 06/29/2020  . 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 Acute on chronic clinical systolic heart failure ([place]) 06/22/2020  . Acute on chronic systolic (congestive) heart failure ([place]) 06/22/2020  . Palliative care encounter   . Cardiogenic shock ([place]) 04/11/2020  . CKD (chronic kidney disease) stage 3, GFR 30-59 ml/min (HCC) 04/11/2020  . Symptomatic anemia 04/09/2020  . Acute on chronic HFrEF (heart failure with reduced ejection fraction) ([place]) 05/05/2019  . HFrEF (heart failure with reduced ejection fraction) ([place]) 05/04/2019  . Acute combined systolic and diastolic congestive heart failure ([place]) 10/02/2018  . Hepatitis C 04/13/2017  . Current non-adherence to medical treatment 04/13/2017  . Acute on chronic combined systolic (congestive) and diastolic (congestive) heart failure ([place]) 04/13/2017  . HTN (hypertension) 04/12/2017  . Insomnia 01/18/2017  . Chronic combined systolic and diastolic congestive heart failure ([place]) 01/04/2017  . Hyperlipidemia 01/04/2017  . Chest pain 12/30/2016  . Coronary artery disease involving native heart 07/18/2014  . Tobacco abuse 07/17/2014  . Cocaine use 07/17/2014   PCP:  [name], NP Pharmacy:   [place], [place] - [address] [place] [address] Phone: [phone] Fax: [phone]  [facility] [address] [place] [address] Phone: [phone] Fax: [place] [address] Phone: [phone] Fax: [phone]     Social Determinants of Health (SDOH) Interventions Food Insecurity Interventions: Other (Comment) (patient reports he does get food stamps) Financial Strain Interventions:  Other (Comment) (patient reported he has tried applying for disability but missed an appointment and is unsure where that stands currently) Housing Interventions: Other (Comment) (outpatient HF CSW working on shelter/housing resources) Transportation Interventions: [facility]  Readmission Risk Interventions Readmission Risk Prevention Plan 11/04/2020 10/09/2020  Transportation Screening Complete Complete  Medication Review Press photographer) Complete Complete  PCP or Specialist appointment within 3-5 days of discharge Complete -  [place] or [place] Complete Complete  SW Recovery Care/Counseling Consult Complete Complete  Palliative Care Screening Not Applicable Not [place] Not Applicable Not Applicable  Some recent data might be hidden   Devery Odwyer, MSW, LCSWA [phone] Heart Failure Social Worker

## 2021-05-14 NOTE — Progress Notes (Signed)
Heart Failure Nurse Navigator Progress Note  Pt screened for HV TOC appropriateness. Pt is established with Dr. Aundra Dubin in AHF clinic.   Navigator available for education/ resources.  Pricilla Holm, RN, BSN Heart Failure Nurse Navigator [phone]

## 2021-05-15 ENCOUNTER — Telehealth (HOSPITAL_COMMUNITY): Payer: Self-pay | Admitting: Licensed Clinical Social Worker

## 2021-05-15 ENCOUNTER — Ambulatory Visit: Payer: Medicaid Other

## 2021-05-15 DIAGNOSIS — C169 Malignant neoplasm of stomach, unspecified: Secondary | ICD-10-CM | POA: Diagnosis not present

## 2021-05-15 DIAGNOSIS — R0600 Dyspnea, unspecified: Secondary | ICD-10-CM

## 2021-05-15 DIAGNOSIS — Z7189 Other specified counseling: Secondary | ICD-10-CM

## 2021-05-15 DIAGNOSIS — N179 Acute kidney failure, unspecified: Secondary | ICD-10-CM

## 2021-05-15 DIAGNOSIS — I5043 Acute on chronic combined systolic (congestive) and diastolic (congestive) heart failure: Secondary | ICD-10-CM | POA: Diagnosis not present

## 2021-05-15 LAB — BASIC METABOLIC PANEL
Anion gap: 8 (ref 5–15)
BUN: 40 mg/dL — ABNORMAL HIGH (ref 6–20)
CO2: 34 mmol/L — ABNORMAL HIGH (ref 22–32)
Calcium: 8.8 mg/dL — ABNORMAL LOW (ref 8.9–10.3)
Chloride: 90 mmol/L — ABNORMAL LOW (ref 98–111)
Creatinine, Ser: 2.02 mg/dL — ABNORMAL HIGH (ref 0.61–1.24)
GFR, Estimated: 38 mL/min — ABNORMAL LOW (ref >60)
Glucose, Bld: 182 mg/dL — ABNORMAL HIGH (ref 70–99)
Potassium: 3.2 mmol/L — ABNORMAL LOW (ref 3.5–5.1)
Sodium: 132 mmol/L — ABNORMAL LOW (ref 135–145)

## 2021-05-15 LAB — COOXEMETRY PANEL
Carboxyhemoglobin: 1.1 % (ref 0.5–1.5)
Carboxyhemoglobin: 1.4 % (ref 0.5–1.5)
Methemoglobin: 1 % (ref 0.0–1.5)
Methemoglobin: 0.9 % (ref 0.0–1.5)
O2 Saturation: 51.9 %
O2 Saturation: 62.1 %
Total hemoglobin: 11.9 g/dL — ABNORMAL LOW (ref 12.0–16.0)
Total hemoglobin: 10.9 g/dL — ABNORMAL LOW (ref 12.0–16.0)

## 2021-05-15 LAB — MAGNESIUM: Magnesium: 1.9 mg/dL (ref 1.7–2.4)

## 2021-05-15 MED ORDER — MAGNESIUM SULFATE IN D5W 1-5 GM/100ML-% IV SOLN
1.0000 g | Freq: Once | INTRAVENOUS | Status: AC
  Administered 2021-05-15: 1 g via INTRAVENOUS
  Filled 2021-05-15: qty 100

## 2021-05-15 MED ORDER — METOLAZONE 2.5 MG PO TABS
2.5000 mg | ORAL_TABLET | Freq: Once | ORAL | Status: AC
  Administered 2021-05-15: 2.5 mg via ORAL
  Filled 2021-05-15: qty 1

## 2021-05-15 MED ORDER — POTASSIUM CHLORIDE CRYS ER 20 MEQ PO TBCR
40.0000 meq | EXTENDED_RELEASE_TABLET | Freq: Two times a day (BID) | ORAL | Status: DC
  Administered 2021-05-15 – 2021-05-20 (×11): 40 meq via ORAL
  Filled 2021-05-15 (×12): qty 2

## 2021-05-15 MED ORDER — POTASSIUM CHLORIDE CRYS ER 20 MEQ PO TBCR
60.0000 meq | EXTENDED_RELEASE_TABLET | Freq: Once | ORAL | Status: AC
  Administered 2021-05-15: 60 meq via ORAL
  Filled 2021-05-15: qty 3

## 2021-05-15 NOTE — Progress Notes (Signed)
Orthopedic Tech Progress Note Patient Details:  Tyler Wong 06/11/1966 [id number]  Ortho Devices Type of Ortho Device: Louretta Parma boot Ortho Device/Splint Location: Bilateral Lower Extremity Ortho Device/Splint Interventions: Ordered,Application   Post Interventions Patient Tolerated: Well Instructions Provided: Care of device,Poper ambulation with device   [name] [name] 05/15/2021, 2:36 PM

## 2021-05-15 NOTE — TOC Progression Note (Signed)
Transition of Care [facility] - Progression Note    Patient Details  Name: Tyler Wong MRN: [id number] Date of Birth: 12/29/1965  [facility] CM/SW Contact  Reece Agar, [place] Phone Number: 05/15/2021, 12:14 PM  Clinical Narrative:    1200: CSW attempted CAGE-AID with pt, pt refused and politely stated he has no desire to quit and does not want any resources. Pt did share that he does have a daily cocaine use.     Barriers to Discharge: Continued Medical Work up,Homeless with medical needs  Expected Discharge Plan and Services   In-house Referral: Clinical Social Work     Living arrangements for the past 2 months: No permanent address,Homeless                                       Social Determinants of Health (SDOH) Interventions Food Insecurity Interventions: Other (Comment) (patient reports he does get food stamps) Financial Strain Interventions: Other (Comment) (patient reported he has tried applying for disability but missed an appointment and is unsure where that stands currently) Housing Interventions: Other (Comment) (outpatient HF CSW working on shelter/housing resources) Transportation Interventions: [facility]  Readmission Risk Interventions Readmission Risk Prevention Plan 11/04/2020 10/09/2020  Transportation Screening Complete Complete  Medication Review Press photographer) Complete Complete  PCP or Specialist appointment within 3-5 days of discharge Complete -  [place] or [place] Complete Complete  SW Recovery Care/Counseling Consult Complete Complete  Palliative Care Screening Not Applicable Not [place] Not Applicable Not Applicable  Some recent data might be hidden

## 2021-05-15 NOTE — Telephone Encounter (Signed)
Outpatient HF CSW met with pt to follow up on referral that was placed while pt was outpatient for [employer] placement.  Was able to contact [employer] intake and pt completed intake process over the phone- now being sent for review and determination.  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  Will continue to follow and assist as needed  [name], [facility] Desk#: [phone] Cell#: [phone]

## 2021-05-15 NOTE — Progress Notes (Addendum)
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  Currently plan is treat with diuretics.  Assessment and Plan: Acute exacerbation of chronic combined CHF/end-stage heart failure Mixed ischemic/nonischemic (cocaine) cardiomyopathy.  Followed by heart failure team and not a candidate for advanced therapies or home milrinone secondary to ongoing cocaine abuse and underlying gastric cancer.  Cardiology currently considering PICC line placement and Primacor during the hospital stay if does not respond to IV diuresis. Cardiology following, appreciate recommendations. Started on IV Lasix 80 mg twice daily and Zaroxolyn.  Monitor intake and output, daily weights, renal function.  Low-sodium diet with fluid restriction.  No repeat echo per cardiology.  Palliative care consulted.  Chest pain noncardiac. Likely due to decompensated advanced heart failure and ongoing cocaine use.  No plan for invasive therapy.  EKG showing ST/T wave changes; no STEMI. High-sensitivity troponin slightly elevated but stable. -Continue cardiac monitoring  AKI on CKD stage II-IIIa Likely cardiorenal from decompensated heart failure.  BUN 50, creatinine 2.1 (baseline 1.2). -Continue IV diuresis and monitor renal function closely.  Hold spironolactone and Entresto.  Avoid any other nephrotoxic agents.  Elevated LFTs Likely due to hepatic congestion from decompensated heart failure. -Continue to monitor  Mild hyponatremia Likely due to volume overload. -Continue  IV diuresis and monitor sodium level closely.  COPD Stable.  No signs of acute exacerbation. -DuoNeb as needed  CAD -Resume aspirin and statin after pharmacy med rec is done.  Hyperlipidemia -Resume statin after pharmacy med rec is done.  Cocaine abuse He has been positive for cocaine during prior hospital visits as well. -Counseled to quit  Tobacco use -NicoDerm patch and counseled to quit  Gastric cancer -On chemotherapy and followed by Dr. Benay Spice  Goals of care conversation. Ideally will benefit from being on hospice.  Currently full code. Discussed with [facility] POA today.  Diet: Cardiac diet DVT Prophylaxis:   Place TED hose Start: 05/14/21 1016 enoxaparin (LOVENOX) injection 40 mg Start: 05/13/21 2245   Advance goals of care discussion: Full code  Family Communication: no family was present at bedside, at the time of interview.  Discussed with sister on the phone.  Disposition:  Status is: Inpatient  Remains inpatient appropriate because:IV treatments appropriate due to intensity of illness or inability to take PO   Dispo: The patient is from: Home              Anticipated d/c is to: Home              Patient currently is not medically stable to d/c.   Difficult to place patient No  Subjective: Breathing better.  No nausea no vomiting.  Pain in the leg and swelling of the leg improving.  Physical Exam:  General: Appear in mild distress, no Rash; Oral Mucosa Clear, moist. no Abnormal Neck Mass Or lumps, Conjunctiva normal  Cardiovascular: S1 and S2 Present, no Murmur, Respiratory: good respiratory effort, Bilateral Air entry present and CTA, no Crackles, no wheezes Abdomen: Bowel Sound present, Soft and improving distention, no tenderness Extremities: Bilateral pedal  edema Neurology: alert and oriented to time, place, and person affect appropriate. no new focal deficit Gait not checked due to patient safety concerns   Vitals:   05/15/21 0811 05/15/21  1118 05/15/21 1645 05/15/21 1700  BP:  109/73 (!) 123/108 104/79  Pulse:  (!) 106 (!) 107   Resp:  18 16   Temp: 98.1 F (36.7 C) (!) 97.5 F (36.4 C) 98.5 F (36.9 C)   TempSrc:  Oral Oral   SpO2:  100% 99%   Weight:      Height:        Intake/Output Summary (Last 24 hours) at 05/15/2021 1935 Last data filed at 05/15/2021 1843 Gross per 24 hour  Intake 1166.18 ml  Output 4825 ml  Net -3658.82 ml   Filed Weights   05/13/21 2122 05/14/21 0422 05/15/21 0521  Weight: 81.3 kg 78.3 kg 76.3 kg    Data Reviewed: I have personally reviewed and interpreted daily labs, tele strips, imaging. I reviewed all nursing notes, pharmacy notes, vitals, pertinent old records I have discussed plan of care as described above with RN and patient/family.  CBC: Recent Labs  Lab 05/13/21 1605  WBC 8.7  HGB 12.6*  HCT 37.6*  MCV 87.2  PLT 885   Basic Metabolic Panel: Recent Labs  Lab 05/13/21 1611 05/14/21 0440 05/15/21 0521  NA 130* 129* 132*  K 3.5 3.6 3.2*  CL 87* 87* 90*  CO2 30 29 34*  GLUCOSE 179* 163* 182*  BUN 50* 46* 40*  CREATININE 2.18* 2.09* 2.02*  CALCIUM 8.6* 8.7* 8.8*  MG  --   --  1.9    Studies: DG CHEST PORT 1 VIEW  Result Date: 05/14/2021 CLINICAL DATA:  Chest pain and shortness of breath EXAM: PORTABLE CHEST 1 VIEW COMPARISON:  05/14/2021 FINDINGS: Cardiomegaly without pulmonary edema. No pleural effusion or pneumothorax. Left chest wall Port-A-Cath with tip in the lower SVC. Right approach PICC line with tip in the lower SVC. No focal airspace consolidation. IMPRESSION: Cardiomegaly without pulmonary edema. Electronically Signed   By: Ulyses Jarred M.D.   On: 05/14/2021 20:38    Scheduled Meds: . aspirin EC  81 mg Oral Daily  . Chlorhexidine Gluconate Cloth  6 each Topical Daily  . enoxaparin (LOVENOX) injection  40 mg Subcutaneous Q24H  . furosemide  80 mg Intravenous BID  . ivabradine  5 mg Oral BID WC  . nicotine  7 mg Transdermal Daily  . potassium  chloride  40 mEq Oral BID  . sodium chloride flush  10-40 mL Intracatheter Q12H  . sodium chloride flush  3 mL Intravenous Q12H   Continuous Infusions: . sodium chloride    . milrinone 0.25 mcg/kg/min (05/15/21 1843)   PRN Meds: sodium chloride, ipratropium-albuterol, polyethylene glycol, sodium chloride flush, sodium chloride flush  Time spent: 35 minutes  Author: Berle Mull, MD [facility] 05/15/2021 7:35 PM  To reach On-call, see care teams to locate the attending and reach out via [email or web address]. Between 7PM-7AM, please contact night-coverage If you still have difficulty reaching the attending provider, please page the [facility] (Director on Call) for [facility] on amion for assistance.

## 2021-05-15 NOTE — Progress Notes (Addendum)
HEMATOLOGY-ONCOLOGY PROGRESS NOTE  SUBJECTIVE: Tyler Wong is followed by our office for gastric cancer.  His last visit was on 03/19/2021.  He received 6 cycles of FOLFOX and had a complete response.  At his last visit, plan was to proceed with a course of concurrent chemoradiation and capecitabine.  However, he did not return to our office for follow-up.  Now admitted for acute on chronic systolic heart failure.  Receiving milrinone and diuretics.  He has ongoing substance abuse and a poor social situation.  The patient reports feeling better today.  He offers no complaints today.  Oncology History  Gastric cancer ([place])  11/29/2020 Initial Diagnosis   Gastric cancer ([place])   12/12/2020 -  Chemotherapy    Patient is on Treatment Plan: GASTROESOPHAGEAL FOLFOX Q14D X 6 CYCLES       PHYSICAL EXAMINATION:  Vitals:   05/15/21 0339 05/15/21 0724  BP: 107/70   Pulse: (!) 108   Resp: 15   Temp: (!) 97.4 F (36.3 C) 98.2 F (36.8 C)  SpO2: 99%    Filed Weights   05/13/21 2122 05/14/21 0422 05/15/21 0521  Weight: 81.3 kg 78.3 kg 76.3 kg    Intake/Output from previous day: 05/18 0701 - 05/19 0700 In: 840 [P.O.:840] Out: 4025 [Urine:4025]  GENERAL: Awake and alert, no distress SKIN: skin color, texture, turgor are normal, no rashes or significant lesions EYES: normal, Conjunctiva are pink and non-injected, sclera clear LUNGS: Diminished at bases, otherwise clear HEART: Regular rhythm, tachycardia, 3/6 murmur ABDOMEN:abdomen soft, non-tender and normal bowel sounds NEURO: alert & oriented x 3 with fluent speech, no focal motor/sensory deficits  LABORATORY DATA:  I have reviewed the data as listed CMP Latest Ref Rng & Units 05/15/2021 05/14/2021 05/13/2021  Glucose 70 - 99 mg/dL 182(H) 163(H) 179(H)  BUN 6 - 20 mg/dL 40(H) 46(H) 50(H)  Creatinine 0.61 - 1.24 mg/dL 2.02(H) 2.09(H) 2.18(H)  Sodium 135 - 145 mmol/L 132(L) 129(L) 130(L)  Potassium 3.5 - 5.1 mmol/L 3.2(L) 3.6 3.5   Chloride 98 - 111 mmol/L 90(L) 87(L) 87(L)  CO2 22 - 32 mmol/L 34(H) 29 30  Calcium 8.9 - 10.3 mg/dL 8.8(L) 8.7(L) 8.6(L)  Total Protein 6.5 - 8.1 g/dL - 6.6 6.8  Total Bilirubin 0.3 - 1.2 mg/dL - 2.0(H) 2.2(H)  Alkaline Phos 38 - 126 U/L - 219(H) 233(H)  AST 15 - 41 U/L - 81(H) 95(H)  ALT 0 - 44 U/L - 232(H) 252(H)    Lab Results  Component Value Date   WBC 8.7 05/13/2021   HGB 12.6 (L) 05/13/2021   HCT 37.6 (L) 05/13/2021   MCV 87.2 05/13/2021   PLT 233 05/13/2021   NEUTROABS 3.0 03/19/2021    DG Chest 2 View  Result Date: 05/13/2021 CLINICAL DATA:  Chest pain for 1 day, no known injury, initial encounter EXAM: CHEST - 2 VIEW COMPARISON:  10/31/2020 FINDINGS: Cardiac shadow is enlarged but stable. Left chest wall port is noted in satisfactory position. The lungs are well aerated bilaterally. No focal infiltrate or sizable effusion is seen. Some minimal scarring in the right upper lobe is noted. No other focal abnormality is noted. IMPRESSION: No acute abnormality noted. Stable cardiomegaly. Electronically Signed   By: Inez Catalina M.D.   On: 05/13/2021 16:59   DG CHEST PORT 1 VIEW  Result Date: 05/14/2021 CLINICAL DATA:  Chest pain and shortness of breath EXAM: PORTABLE CHEST 1 VIEW COMPARISON:  05/14/2021 FINDINGS: Cardiomegaly without pulmonary edema. No pleural effusion or pneumothorax. Left chest  wall Port-A-Cath with tip in the lower SVC. Right approach PICC line with tip in the lower SVC. No focal airspace consolidation. IMPRESSION: Cardiomegaly without pulmonary edema. Electronically Signed   By: Ulyses Jarred M.D.   On: 05/14/2021 20:38   DG CHEST PORT 1 VIEW  Result Date: 05/14/2021 CLINICAL DATA:  Post PICC line placement EXAM: PORTABLE CHEST 1 VIEW COMPARISON:  CT 04/09/2020, radiograph 05/13/2021 FINDINGS: Interval placement of a right upper extremity PICC tip which appears to cross midline, likely directed into the left innominate vein, terminating inferior to portion  the more appropriately positioned the left IJ approach Port-A-Cath which is seen terminating at the superior cavoatrial junction. Cardiomegaly is similar to comparison exam. Low volumes and atelectasis. Bandlike scarring or subsegmental atelectasis in right mid to upper lung. Mild pulmonary vascular congestion with few peripheral septal lines, could reflect early developing edema. No new consolidative process. No pneumothorax or effusion. IMPRESSION: Right upper extremity PICC tip appears to terminate in the left innominate vein. Recommend repositioning. Left IJ approach Port-A-Cath tip remains appropriately positioned at the superior cavoatrial junction. Cardiomegaly with pulmonary vascular congestion and septal thickening which could reflect early interstitial edema. Can be indicative of CHF or volume overload in the appropriate clinical setting. These results will be called to the ordering clinician or representative by the Radiologist Assistant, and communication documented in the PACS or [employer]. Electronically Signed   By: Lovena Le M.D.   On: 05/14/2021 17:13   [place] EKG SITE RITE  Result Date: 05/14/2021 If Site Rite image not attached, placement could not be confirmed due to current cardiac rhythm.   ASSESSMENT AND PLAN: 1. Gastric cancer  CT abdomen/pelvis 11/01/2020-masslike area measuring 6.3 x 5.6 x 5.6 cm in the antral prepyloric region of the stomach  Upper endoscopy 11/05/2020-patchy white plaques in the distal esophagus; 3 nonbleeding cratered gastric ulcers with pigmented material in the gastric antrum largest 10 mm; segmental moderate inflammation characterized by congestion, erosions and erythema in the gastric antrum. 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  No lymphadenopathy or metastatic disease in the abdomen or pelvis. 2. CHF, LVEF 20-25% 10/04/2020 3. CAD status post prior LAD stent 4. COPD 5. Chronic kidney disease 6. Cocaine usage 7. Port-A-Cath placement 12/06/2020, Interventional Radiology 8. Mild neutropenia secondary to chemotherapy-Udenyca added with cycle 5 FOLFOX 9. Hospital admission 05/13/2021- acute exacerbation of CHF/end-stage heart failure  Tyler Wong is now admitted for acute exacerbation of CHF/end-stage heart failure.  Cardiology following and indicate he has mixed ischemic/nonischemic (cocaine) cardiomyopathy.  He is receiving milrinone and IV diuretics.  The patient has received 6 cycles of FOLFOX for his gastric cancer.  He had a complete response on CT scan following treatment.  Our plan was to start concurrent chemoradiation with capecitabine.  However, the patient did not follow-up with [place] due to poor social situation.  Social work is currently following to assist with living arrangements postdischarge.  From our standpoint, we will  plan to see him back in our office to discuss starting him capecitabine given concurrently with radiation.  Recommendations: 1.  Continue management of CHF per cardiology. 2.  Recommend abstinence from cocaine. 3.  Social work to assist living arrangements postdischarge. 4.  We will arrange for outpatient follow-up for further discussion regarding concurrent chemoradiation with capecitabine if his condition improves. 5.  Please call Oncology as needed while he is in  the hospital   LOS: 2 days   Mikey Bussing, DNP, AGPCNP-BC, AOCNP 05/15/21 Tyler Wong was interviewed and examined.  He is known to me with a history of gastric cancer.  He had a good clinical and radiologic response to FOLFOX chemotherapy.  He was scheduled to begin concurrent capecitabine and radiation in March.  He was lost to follow-up.  He is now admitted with CHF.  He is no longer living in the rehabilitation facility and has resumed drug use.  We will proceed with capecitabine or infusional 5-FU and radiation if he is compliant with follow-up.  He appears to be a candidate for hospice care from both the cardiology and oncology standpoint if he is unable to be compliant with medical follow-up.  I was present for greater than 50% of today's visit.  I performed medical decision making.

## 2021-05-15 NOTE — Consult Note (Signed)
Consultation Note Date: 05/15/2021   Patient Name: Tyler Wong  DOB: Dec 28, 1966  MRN: [id number]  Age / Sex: 55 y.o., male  PCP: [name], NP Referring Physician: Lavina Hamman, MD  Reason for Consultation: Establishing goals of care  HPI/Patient Profile: 55 y.o. male  with past medical history of CAD status post PCI, STEMI, chronic combined end-stage CHF secondary to mixed ischemic and nonischemic cardiomyopathy with EF less than 20% with mild RV dysfunction, COPD, CKD stage II-IIIa, gastric cancer, hypertension, HLD, GERD, and cocaine abuse admitted on 05/13/2021 with shortness of breath and chest pain.  Patient reports continued daily cocaine use and homelessness. He is therefore not a candidate for advanced therapies or home milrinone. Palliative care has been consulted to assist with goals of care conversation.  Clinical Assessment and Goals of Care:  I have reviewed medical records including EPIC notes, labs and imaging, received report from RN, assessed the patient and met at the bedside to discuss diagnosis, prognosis, GOC, EOL wishes, disposition and options.  I introduced Palliative Medicine as specialized medical care for people living with serious illness. It focuses on providing relief from the symptoms and stress of a serious illness. The goal is to improve quality of life for both the patient and the family.  I attempted to elicit values and goals of care important to the patient.    We discussed a brief life review of the patient and then focused on their current illness. Tyler Wong states that "a lot has happened" since his last admission, most notably his homelessness. He is stoic and does not offer many details, although he confirms having some family support through his sister/HCPOA [name] and his 2 daughters. He states that one daughter does not live locally. He is still able to walk  without any assistive device and complete his ADLs independently. I shared my hope for his successful Engineer, building services after discharge and [facility] agrees that he would like this. He has never stayed there before and does not contribute any further hopes or worries.    Tyler Wong understands the severity of his illness but he does not like to discuss this at length. He states "I'm slowly dying and my body is being depleted, it's only going to get worse." However, he also expresses his willingness to continue with all offered interventions, including a follow up with outpatient oncology. He is uncertain of his socioeconomic needs to facilitate this, but agrees he will likely need transportation to and from his appointments.  Advanced directives, concepts specific to code status, artifical feeding and hydration, and rehospitalization were considered and discussed. Tyler Wong clearly states that his goals have not changed since the last [facility] conversation. He continues to desire resuscitation attempts, aggressive medical interventions, and rehospitalization if indicated.  Palliative Care services outpatient were explained and offered. We reviewed his experience with a previous outpatient referral. Tyler Wong states that he no longer wanted to speak with them after one visit. He also declines a referral at this time.   Discussed  the importance of continued conversation with family and the medical providers regarding overall plan of care and treatment options, ensuring decisions are within the context of the patient's values and GOCs.    Questions and concerns were addressed. The patient was encouraged to call with questions or concerns. PMT will continue to support holistically.   HCPOA is patient's sister [name]; he currently makes his own medical decisions.    SUMMARY OF RECOMMENDATIONS   -Full code/full scope treatment -Patient seems to have a better understanding of his life-limiting illness than during  his previously documented discussions with PMT, however he still does not wish to discuss EOL in detail or reconsider code status/care plan -HCPOA on [place] remains current and aligned with patient's goals -Patient politely declines further inpatient or outpatient palliative follow up -PMT will continue to shadow for needs, please do not hesitate to reach out  Code Status/Advance Care Planning:  Full code  Palliative Prophylaxis:   Delirium Protocol and Frequent Pain Assessment  Additional Recommendations (Limitations, Scope, Preferences):  Full Scope Treatment  Psycho-social/Spiritual:   Desire for further Chaplaincy support:no  Additional Recommendations: Referral to Community Resources   Prognosis:   Unfortunately poor long-term prognosis with limited treatment options and lack of interest in discontinuing cocaine use  Discharge Planning: To Be Determined      Primary Diagnoses: Present on Admission: . Cocaine use . Tobacco abuse . Coronary artery disease involving native heart . Hyperlipidemia   I have reviewed the medical record, interviewed the patient and family, and examined the patient. The following aspects are pertinent.  Past Medical History:  Diagnosis Date  . CAD in native artery 07/17/14   STEMI- LAD stenosis with DES  . CHF (congestive heart failure) ([place])   . Cocaine abuse ([place])   . COPD (chronic obstructive pulmonary disease) ([place])   . Dyspnea   . Gastric cancer ([place]) 11/01/2020  . GERD (gastroesophageal reflux disease)   . Hypertension   . ST elevation myocardial infarction (STEMI) involving left anterior descending (LAD) coronary artery with complication ([place])   . Tobacco abuse 07/17/2014   Social History   Socioeconomic History  . Marital status: Single    Spouse name: Not on file  . Number of children: 2  . Years of education: 77  . Highest education level: 12th grade  Occupational History  . Occupation: Landscaping  Tobacco Use  .  Smoking status: Current Every Day Smoker    Packs/day: 0.25    Years: 30.00    Pack years: 7.50    Types: Cigarettes  . Smokeless tobacco: Never Used  . Tobacco comment: .5 PK PER DAY  Vaping Use  . Vaping Use: Never used  Substance and Sexual Activity  . Alcohol use: No    Comment: Patient denies abuse, states social drinker  . Drug use: Yes    Types: Cocaine, "Crack" cocaine    Comment: heroin  . Sexual activity: Yes  Other Topics Concern  . Not on file  Social History Narrative   Lives in [place] with his sister and girlfriend. He works Aeronautical engineer work.    Social Determinants of Health   Financial Resource Strain: High Risk  . Difficulty of Paying Living Expenses: Very hard  Food Insecurity: Food Insecurity Present  . Worried About Charity fundraiser in the Last Year: Sometimes true  . Ran Out of Food in the Last Year: Sometimes true  Transportation Needs: No Transportation Needs  . Lack of Transportation (Medical): No  .  Lack of Transportation (Non-Medical): No  Physical Activity: Not on file  Stress: Not on file  Social Connections: Not on file   Family History  Problem Relation Age of Onset  . Cirrhosis Father   . Heart attack Mother   . Heart attack Sister   . Heart failure Sister   . Heart attack Brother    Scheduled Meds: . aspirin EC  81 mg Oral Daily  . Chlorhexidine Gluconate Cloth  6 each Topical Daily  . enoxaparin (LOVENOX) injection  40 mg Subcutaneous Q24H  . furosemide  80 mg Intravenous BID  . ivabradine  5 mg Oral BID WC  . nicotine  7 mg Transdermal Daily  . potassium chloride  40 mEq Oral BID  . sodium chloride flush  10-40 mL Intracatheter Q12H  . sodium chloride flush  3 mL Intravenous Q12H   Continuous Infusions: . sodium chloride    . milrinone 0.25 mcg/kg/min (05/15/21 0815)   PRN Meds:.sodium chloride, ipratropium-albuterol, polyethylene glycol, sodium chloride flush, sodium chloride flush Medications Prior to  Admission:  Prior to Admission medications   Medication Sig Start Date End Date Taking? Authorizing Provider  acidophilus (RISAQUAD) CAPS capsule Take 1 capsule by mouth daily.   Yes [provider]  albuterol (VENTOLIN HFA) 108 (90 Base) MCG/ACT inhaler Inhale 2 puffs into the lungs every 4 (four) hours as needed for wheezing or shortness of breath. 01/24/20  Yes Edwards, Michelle P, NP  ASPIRIN LOW DOSE 81 MG EC tablet TAKE 1 TABLET (81 MG TOTAL) BY MOUTH DAILY (MORNING) Patient taking differently: Take 81 mg by mouth in the morning. 03/05/21  Yes Bensimhon, Shaune Pascal, MD  atorvastatin (LIPITOR) 80 MG tablet Take 1 tablet (80 mg total) by mouth daily. 11/26/20  Yes [name], MD  BIDIL 20-37.5 MG tablet TAKE 1 TABLET BY MOUTH 3 (THREE) TIMES DAILY.(AM+NOON+BEDTIME) Patient taking differently: Take 1 tablet by mouth 3 (three) times daily. 01/28/21  Yes [name], MD  digoxin (LANOXIN) 0.125 MG tablet Take 1 tablet (0.125 mg total) by mouth daily. 11/26/20  Yes [name], MD  empagliflozin (JARDIANCE) 10 MG TABS tablet Take 1 tablet (10 mg total) by mouth daily. 11/26/20  Yes [name], MD  ivabradine (CORLANOR) 5 MG TABS tablet Take 1 tablet (5 mg total) by mouth 2 (two) times daily with a meal. 11/26/20  Yes [name], MD  omeprazole (PRILOSEC) 20 MG capsule TAKE ONE CAPSULE BY MOUTH ONCE DAILY (MORNING) Patient taking differently: Take 20 mg by mouth in the morning. 01/28/21  Yes [name], NP  potassium chloride SA (KLOR-CON) 20 MEQ tablet Take 2 tablets (40 mEq total) by mouth daily. 01/20/21  Yes [name], MD  sacubitril-valsartan (ENTRESTO) 24-26 MG Take 1 tablet by mouth 2 (two) times daily. 01/20/21  Yes [name], MD  torsemide (DEMADEX) 20 MG tablet Take 4 tablets (80 mg total) by mouth in the morning AND 3 tablets (60 mg total) every evening. Patient taking differently: Take 80 mg by mouth in the morning AND 60 mg every evening.  02/10/21  Yes [place], Jessica M, FNP  capecitabine (XELODA) 500 MG tablet TAKE 3 TABS (1500 MG) BY MOUTH IN AM AND 3 TABS (1500 MG) BY MOUTH IN PM. TAKE WITHIN 30 MINUTES AFTER MEALS.TAKE ON RADIATION DAYS ONLY Patient not taking: Reported on 05/14/2021 03/19/21 03/19/22  Ladell Pier, MD  pantoprazole (PROTONIX) 40 MG tablet Take 1 tablet (40 mg total) by  mouth 2 (two) times daily. 12/03/20 01/02/21  Consuelo Pandy, PA-C  spironolactone (ALDACTONE) 25 MG tablet Take 0.5 tablets (12.5 mg total) by mouth daily. 02/10/21 05/11/21  Rafael Bihari, FNP   No Known Allergies Review of Systems: all systems reviewed and negative   Physical Exam Gen: alert, stoic and minimally conversing Cardio: tachycardic Pulm: normal respiratory effort on RA Neuro: oriented x3, lethargic Psych: flat affect MSK: compressive wraps on BL lower extremities   Vital Signs: BP 109/73 (BP Location: Left Arm)   Pulse (!) 106   Temp (!) 97.5 F (36.4 C) (Oral)   Resp 18   Ht _0  (1.778 m)   Wt 76.3 kg   SpO2 100%   BMI 24.13 kg/m  Pain Scale: 0-10   Pain Score: 0-No pain   SpO2: SpO2: 100 % O2 Device:SpO2: 100 % O2 Flow Rate: .   IO: Intake/output summary:   Intake/Output Summary (Last 24 hours) at 05/15/2021 1557 Last data filed at 05/15/2021 1522 Gross per 24 hour  Intake 968 ml  Output 6625 ml  Net -5657 ml    LBM: Last BM Date: 05/14/21 Baseline Weight: Weight: 77.1 kg Most recent weight: Weight: 76.3 kg       Time In: 3:15pm Time Out: 3:45pm Time Total: 30 minutes  Greater than 50% of this time was spent in counseling and coordinating care related to the above assessment and plan.  Dorthy Cooler, PA-C Palliative Medicine Team Team phone # [phone]  Thank you for allowing the Palliative Medicine Team to assist in the care of this patient. Please utilize secure chat with additional questions, if there is no response within 30 minutes please call the above phone  number.  Palliative Medicine Team providers are available by phone from 7am to 7pm daily and can be reached through the team cell phone.  Should this patient require assistance outside of these hours, please call the patient's attending physician.

## 2021-05-15 NOTE — Plan of Care (Signed)

## 2021-05-15 NOTE — Progress Notes (Addendum)
Advanced Heart Failure Rounding Note  PCP-Cardiologist: Shelva Majestic, MD   Subjective:    PICC placed yesterday. Initial co-ox 51%.   Now on milrinone 0.25. Co-ox better 62% today. UOP markedly improved w/ 4L out yesterday. Wt overall down 11 lb. CVP 13-14  SCr stable at 2.0 K 3.2   SBPs soft, upper 90s-low 100s  Objective:   Weight Range: 76.3 kg Body mass index is 24.13 kg/m.   Vital Signs:   Temp:  [97.4 F (36.3 C)-98.2 F (36.8 C)] 98.2 F (36.8 C) (05/19 0724) Pulse Rate:  [108] 108 (05/19 0339) Resp:  [15-20] 15 (05/19 0339) BP: (101-114)/(70-103) 107/70 (05/19 0339) SpO2:  [99 %-100 %] 99 % (05/19 0339) Weight:  [76.3 kg] 76.3 kg (05/19 0521) Last BM Date: 05/14/21  Weight change: Filed Weights   05/13/21 2122 05/14/21 0422 05/15/21 0521  Weight: 81.3 kg 78.3 kg 76.3 kg    Intake/Output:   Intake/Output Summary (Last 24 hours) at 05/15/2021 1000 Last data filed at 05/15/2021 0817 Gross per 24 hour  Intake 608 ml  Output 4425 ml  Net -3817 ml      Physical Exam    CVP 13-14  General: fatigue appearing. No resp difficulty HEENT: Normal Neck: Supple. JVP 14 cm . Carotids 2+ bilat; no bruits. No lymphadenopathy or thyromegaly appreciated. Cor: PMI nondisplaced. Regular rhythm, tachy rate. 3/6 MR murmur at apex  Lungs: decreased BS at the bases, no wheezing  Abdomen: Soft, nontender, nondistended. No hepatosplenomegaly. No bruits or masses. Good bowel sounds. Extremities: No cyanosis, clubbing, rash, trace bilateral LE edema + TED hoses  Neuro: Alert & orientedx3, cranial nerves grossly intact. moves all 4 extremities w/o difficulty. Affect pleasant   Telemetry   Sinus tach w/ occasional PVCs   EKG    No new EKG to review   Labs    CBC Recent Labs    05/13/21 1605  WBC 8.7  HGB 12.6*  HCT 37.6*  MCV 87.2  PLT 409   Basic Metabolic Panel Recent Labs    05/14/21 0440 05/15/21 0521  NA 129* 132*  K 3.6 3.2*  CL 87* 90*   CO2 29 34*  GLUCOSE 163* 182*  BUN 46* 40*  CREATININE 2.09* 2.02*  CALCIUM 8.7* 8.8*  MG  --  1.9   Liver Function Tests Recent Labs    05/13/21 1611 05/14/21 0440  AST 95* 81*  ALT 252* 232*  ALKPHOS 233* 219*  BILITOT 2.2* 2.0*  PROT 6.8 6.6  ALBUMIN 3.0* 3.0*   Recent Labs    05/13/21 1611  LIPASE 54*   Cardiac Enzymes No results for input(s): CKTOTAL, CKMB, CKMBINDEX, TROPONINI in the last 72 hours.  BNP: BNP (last 3 results) Recent Labs    10/31/20 1604 11/12/20 1054 05/13/21 1611  BNP 1,322.2* 970.2* 1,976.6*    ProBNP (last 3 results) No results for input(s): PROBNP in the last 8760 hours.   D-Dimer No results for input(s): DDIMER in the last 72 hours. Hemoglobin A1C No results for input(s): HGBA1C in the last 72 hours. Fasting Lipid Panel No results for input(s): CHOL, HDL, LDLCALC, TRIG, CHOLHDL, LDLDIRECT in the last 72 hours. Thyroid Function Tests No results for input(s): TSH, T4TOTAL, T3FREE, THYROIDAB in the last 72 hours.  Invalid input(s): FREET3  Other results:   Imaging    DG CHEST PORT 1 VIEW  Result Date: 05/14/2021 CLINICAL DATA:  Chest pain and shortness of breath EXAM: PORTABLE CHEST 1 VIEW COMPARISON:  05/14/2021 FINDINGS: Cardiomegaly without pulmonary edema. No pleural effusion or pneumothorax. Left chest wall Port-A-Cath with tip in the lower SVC. Right approach PICC line with tip in the lower SVC. No focal airspace consolidation. IMPRESSION: Cardiomegaly without pulmonary edema. Electronically Signed   By: Ulyses Jarred M.D.   On: 05/14/2021 20:38   DG CHEST PORT 1 VIEW  Result Date: 05/14/2021 CLINICAL DATA:  Post PICC line placement EXAM: PORTABLE CHEST 1 VIEW COMPARISON:  CT 04/09/2020, radiograph 05/13/2021 FINDINGS: Interval placement of a right upper extremity PICC tip which appears to cross midline, likely directed into the left innominate vein, terminating inferior to portion the more appropriately positioned the  left IJ approach Port-A-Cath which is seen terminating at the superior cavoatrial junction. Cardiomegaly is similar to comparison exam. Low volumes and atelectasis. Bandlike scarring or subsegmental atelectasis in right mid to upper lung. Mild pulmonary vascular congestion with few peripheral septal lines, could reflect early developing edema. No new consolidative process. No pneumothorax or effusion. IMPRESSION: Right upper extremity PICC tip appears to terminate in the left innominate vein. Recommend repositioning. Left IJ approach Port-A-Cath tip remains appropriately positioned at the superior cavoatrial junction. Cardiomegaly with pulmonary vascular congestion and septal thickening which could reflect early interstitial edema. Can be indicative of CHF or volume overload in the appropriate clinical setting. These results will be called to the ordering clinician or representative by the Radiologist Assistant, and communication documented in the PACS or [employer]. Electronically Signed   By: Lovena Le M.D.   On: 05/14/2021 17:13   [place] EKG SITE RITE  Result Date: 05/14/2021 If Site Rite image not attached, placement could not be confirmed due to current cardiac rhythm.     Medications:     Scheduled Medications: . aspirin EC  81 mg Oral Daily  . Chlorhexidine Gluconate Cloth  6 each Topical Daily  . enoxaparin (LOVENOX) injection  40 mg Subcutaneous Q24H  . furosemide  80 mg Intravenous BID  . ivabradine  5 mg Oral BID WC  . nicotine  7 mg Transdermal Daily  . potassium chloride  40 mEq Oral BID  . sodium chloride flush  10-40 mL Intracatheter Q12H  . sodium chloride flush  3 mL Intravenous Q12H     Infusions: . sodium chloride    . milrinone 0.25 mcg/kg/min (05/15/21 0815)     PRN Medications:  sodium chloride, ipratropium-albuterol, polyethylene glycol, sodium chloride flush, sodium chloride flush   Assessment/Plan   1. Acute on chronic systolic ZO:[id number] MI in  2015, DES to LAD.Echo in 2015 with EF 40-45%. Cath in 10/19 showed 90% ostial OM1 and 70% PLV, no interventional target. 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 LastEcho 10/21 showed EF 20-25% with mild RV dysfunction.He has a strong family history of CHF/cardiomyopathy, so may be a component of familial cardiomyopathy.Cocaine likely plays a role in his cardiomyopathy.Multiple re-admits forcardiogenic shock requiring milrinone. Unfortunately, not a candidate for advanced therapies or home milrinone with active use of cocaine, homelessness, and gastric cancer. He has end-stage HF. Now readmitted w/ volume overloaded.  He has been sporadic with his meds and is cocaine+. Creatinine up to 2.09, well above baseline. Initial poor response to IV Lasix raising concern for low output.  PICC line placed and initial co-ox low at 51%. Started on milrinone 0.25. UOP improving. Co-ox up to 62% today. CVP 13-14. SCr stable  - Continue milrinone at 0.25 while in hospital to help with diuresis but he cannot go home on milrinone given drug use  -  Continue IV Lasix 80 mg bid and give another dose of metolazone 2.5 mg x 1 - follow CO2. ? Diamox if remains elevated  - Given social situation, gastric cancer,and ongoing substance abuse,he is not a candidate for any advanced therapies including home milrinone - Can continue home ivabradine 5 mg tid.  - Hold Entresto, digoxin, Jardiance, Bidil, and spironolactone with soft BP (SBP 90s-100s) and elevated creatinine.  Suspect he has not been compliant with these meds at home.    - No ?blocker w/ h/o low output and cocaine use - Unna boots. 2. CAD: H/o anterior STEMI with DES to LAD in 7/15. Cath in 10/19 with 90% ostial OM1 and 70% PLV, no interventional target.No chest pain currently.  HS-TnI mildly elevated with no trend, suspect demand ischemia with volume overload and low output. -ContinueASA 81 and atorvastatin 80 mg daily.  - needs to  refrain from using cocaine 3. Cocaine abuse:Has been +cocaine each hospital visit. Encouraged abstinence.  4. Active smoker:Urged cessation.  5. AKI on CKD Stage 3: Stable overnight w/ diuresis. Cardiorenal syndrome, continue milrinone while inpatient.  6. Mitral Regurgitation: Moderate to severe on 10/21 echo, suspect functional MR from dilated CMP. -Not great Mitraclip candidate with cocaine abuse and compliance issues.  7. Gastric Cancer: Intramucosal adenocarcinoma.Has had FOLFOX chemo.  Was supposed to get radiation but has not been compliant with followup. Will discuss prior to discharge.  8. Elevated LFTs: Suspect congestive hepatopathy.  9. Hyponatremia: 132 today. Hypervolemic hyponatremia.  Follow with diuresis.  10. Hypokalemia: K 3.2 - supp w/ KCl   Length of Stay: 2  Brittainy Simmons, PA-C  05/15/2021, 10:00 AM  Advanced Heart Failure Team Pager [phone] (M-F; 7a - 5p)  Please contact [facility] for night-coverage after hours (5p -7a ) and weekends on [email or web address]  Patient seen with PA, agree with the above note.   Patient is doing better with diuresis today since addition of milrinone for low output HF.  Creatinine stable at 2.  Co-ox up to 62%, CVP 17 on my read still.   General: NAD Neck: JVP 16 cm, no thyromegaly or thyroid nodule.  Lungs: Clear to auscultation bilaterally with normal respiratory effort. AV:[id number] PMI.  Heart regular S1/S2, no S3/S4, 2/6 HSM LLSB/apex.  2+ edema to knees.   Abdomen: Soft, nontender, no hepatosplenomegaly, no distention.  Skin: Intact without lesions or rashes.  Neurologic: Alert and oriented x 3.  Psych: Normal affect. Extremities: No clubbing or cyanosis.  HEENT: Normal.   Still volume overloaded now with adequate co-ox on milrinone.  Continue milrinone 0.25 + lasix 80 mg IV bid and metolazone 2.5 x 1.  Replace K and Mg.  Creatinine stable at 2. No BP room for GDMT yet and creatinine is elevated at 2.   Loralie Champagne 05/15/2021 11:52 AM

## 2021-05-16 ENCOUNTER — Ambulatory Visit: Payer: Medicaid Other

## 2021-05-16 DIAGNOSIS — I5043 Acute on chronic combined systolic (congestive) and diastolic (congestive) heart failure: Secondary | ICD-10-CM | POA: Diagnosis not present

## 2021-05-16 LAB — BASIC METABOLIC PANEL
Anion gap: 7 (ref 5–15)
BUN: 37 mg/dL — ABNORMAL HIGH (ref 6–20)
CO2: 33 mmol/L — ABNORMAL HIGH (ref 22–32)
Calcium: 8.8 mg/dL — ABNORMAL LOW (ref 8.9–10.3)
Chloride: 89 mmol/L — ABNORMAL LOW (ref 98–111)
Creatinine, Ser: 1.87 mg/dL — ABNORMAL HIGH (ref 0.61–1.24)
GFR, Estimated: 42 mL/min — ABNORMAL LOW (ref >60)
Glucose, Bld: 191 mg/dL — ABNORMAL HIGH (ref 70–99)
Potassium: 3.8 mmol/L (ref 3.5–5.1)
Sodium: 129 mmol/L — ABNORMAL LOW (ref 135–145)

## 2021-05-16 LAB — HEPATIC FUNCTION PANEL
ALT: 134 U/L — ABNORMAL HIGH (ref 0–44)
AST: 36 U/L (ref 15–41)
Albumin: 2.6 g/dL — ABNORMAL LOW (ref 3.5–5.0)
Alkaline Phosphatase: 175 U/L — ABNORMAL HIGH (ref 38–126)
Bilirubin, Direct: 0.4 mg/dL — ABNORMAL HIGH (ref 0.0–0.2)
Indirect Bilirubin: 1 mg/dL — ABNORMAL HIGH (ref 0.3–0.9)
Total Bilirubin: 1.4 mg/dL — ABNORMAL HIGH (ref 0.3–1.2)
Total Protein: 5.9 g/dL — ABNORMAL LOW (ref 6.5–8.1)

## 2021-05-16 LAB — COOXEMETRY PANEL
Carboxyhemoglobin: 1.3 % (ref 0.5–1.5)
Methemoglobin: 1.2 % (ref 0.0–1.5)
O2 Saturation: 56.2 %
Total hemoglobin: 11.1 g/dL — ABNORMAL LOW (ref 12.0–16.0)

## 2021-05-16 LAB — MAGNESIUM: Magnesium: 1.9 mg/dL (ref 1.7–2.4)

## 2021-05-16 MED ORDER — SENNOSIDES-DOCUSATE SODIUM 8.6-50 MG PO TABS
1.0000 | ORAL_TABLET | Freq: Two times a day (BID) | ORAL | Status: DC
  Administered 2021-05-16 – 2021-05-19 (×8): 1 via ORAL
  Filled 2021-05-16 (×9): qty 1

## 2021-05-16 MED ORDER — SPIRONOLACTONE 12.5 MG HALF TABLET
12.5000 mg | ORAL_TABLET | Freq: Every day | ORAL | Status: DC
  Administered 2021-05-16 – 2021-05-20 (×5): 12.5 mg via ORAL
  Filled 2021-05-16 (×5): qty 1

## 2021-05-16 MED ORDER — MAGNESIUM SULFATE IN D5W 1-5 GM/100ML-% IV SOLN
1.0000 g | Freq: Once | INTRAVENOUS | Status: AC
  Administered 2021-05-16: 1 g via INTRAVENOUS
  Filled 2021-05-16: qty 100

## 2021-05-16 MED ORDER — ATORVASTATIN CALCIUM 80 MG PO TABS
80.0000 mg | ORAL_TABLET | Freq: Every day | ORAL | Status: DC
  Administered 2021-05-16 – 2021-05-21 (×6): 80 mg via ORAL
  Filled 2021-05-16 (×6): qty 1

## 2021-05-16 MED ORDER — POLYETHYLENE GLYCOL 3350 17 G PO PACK
17.0000 g | PACK | Freq: Two times a day (BID) | ORAL | Status: DC
  Administered 2021-05-16 – 2021-05-19 (×7): 17 g via ORAL
  Filled 2021-05-16 (×8): qty 1

## 2021-05-16 MED ORDER — METOLAZONE 2.5 MG PO TABS
2.5000 mg | ORAL_TABLET | Freq: Once | ORAL | Status: AC
  Administered 2021-05-16: 2.5 mg via ORAL
  Filled 2021-05-16: qty 1

## 2021-05-16 MED ORDER — POTASSIUM CHLORIDE CRYS ER 20 MEQ PO TBCR
40.0000 meq | EXTENDED_RELEASE_TABLET | Freq: Once | ORAL | Status: AC
  Administered 2021-05-16: 40 meq via ORAL
  Filled 2021-05-16: qty 2

## 2021-05-16 MED ORDER — DIGOXIN 125 MCG PO TABS
0.1250 mg | ORAL_TABLET | Freq: Every day | ORAL | Status: DC
  Administered 2021-05-16 – 2021-05-20 (×5): 0.125 mg via ORAL
  Filled 2021-05-16 (×5): qty 1

## 2021-05-16 NOTE — Progress Notes (Signed)
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  Currently plan is treat with diuretics and milrinone.  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  Followed by heart failure team and not a candidate for advanced therapies or home milrinone secondary to ongoing cocaine abuse and underlying gastric cancer.  Appreciate palliative consultation. Patient has a PICC line and started on Primacor. Also on IV Lasix 80 mg twice daily along with Zaroxolyn  Along with that on ivabradine 5 mg 3 times daily, Aldactone 12.5 mg and digoxin 0.125 mg. No beta-blocker due to ongoing cocaine use. Holding [place], [place].due to soft blood pressure and elevated creatinine  Monitor intake and output, daily weights, renal function.  Low-sodium diet with fluid restriction.  No repeat echo per cardiology.  Palliative care consulted.  Chest pain noncardiac. CAD Cocaine use Likely due to decompensated advanced heart failure and ongoing cocaine use.  No plan for invasive therapy.  EKG showing ST/T wave changes; no STEMI. High-sensitivity troponin slightly elevated but stable. Continue aspirin and Lipitor.  AKI on CKD stage IIIa Likely cardiorenal from decompensated heart failure.  BUN 50, creatinine 2.1 (baseline 1.2). -Continue IV diuresis and monitor renal function closely.  Hold spironolactone and Entresto.  Avoid any other  nephrotoxic agents.  Elevated LFTs Likely due to hepatic congestion from decompensated heart failure. -Continue to monitor  Mild hyponatremia Likely due to volume overload. -Continue IV diuresis and monitor sodium level closely.  COPD Stable.  No signs of acute exacerbation. -DuoNeb as needed  Hyperlipidemia Continue Lipitor.  Cocaine abuse He has been positive for cocaine during prior hospital visits as well. -Counseled to quit  Tobacco use -NicoDerm patch and counseled to quit  Gastric cancer -On chemotherapy and followed by Dr. Benay Spice  Goals of care conversation. Ideally will benefit from being on hospice.  Currently full code. Discussed with HC POA on 5/19.  Diet: Cardiac diet DVT Prophylaxis:   Place TED hose Start: 05/14/21 1016 enoxaparin (LOVENOX) injection 40 mg Start: 05/13/21 2245   Advance goals of care discussion: Full code  Family Communication: no family was present at bedside, at the time of interview.  Discussed with sister on the phone.  Disposition:  Status is: Inpatient  Remains inpatient appropriate because:IV treatments appropriate due to intensity of illness or inability to take PO  Dispo: The patient is from: Home              Anticipated d/c is to: Home              Patient currently is not medically stable to d/c.   Difficult to place patient No  Subjective: Continues to have shortness of breath.  No chest pain or no tightness.  No nausea no vomiting.  No fever no chills.  Physical Exam:  General: Appear in mild distress, no Rash; Oral Mucosa Clear, moist. no Abnormal Neck Mass Or lumps, Conjunctiva normal  Cardiovascular: S1 and S2 Present, no Murmur, Respiratory: increased respiratory effort, Bilateral  Air entry present and bilateral  Crackles, no wheezes Abdomen: Bowel Sound present, Soft and no tenderness Extremities: bilateral Pedal edema Neurology: alert and oriented to time, place, and person affect appropriate. no  new focal deficit Gait not checked due to patient safety concerns   Vitals:   05/16/21 0831 05/16/21 1130 05/16/21 1556 05/16/21 1643  BP: 101/78 101/79 99/65 102/79  Pulse:  99 (!) 107   Resp: 20 20 18    Temp:  98.4 F (36.9 C) 98.4 F (36.9 C)   TempSrc:  Oral Oral   SpO2: 99% 98% 99%   Weight:      Height:        Intake/Output Summary (Last 24 hours) at 05/16/2021 1910 Last data filed at 05/16/2021 1757 Gross per 24 hour  Intake 1137.4 ml  Output 4850 ml  Net -3712.6 ml   Filed Weights   05/14/21 0422 05/15/21 0521 05/16/21 0600  Weight: 78.3 kg 76.3 kg 73.7 kg    Data Reviewed: I have personally reviewed and interpreted daily labs, tele strips, imaging. I reviewed all nursing notes, pharmacy notes, vitals, pertinent old records I have discussed plan of care as described above with RN and patient/family.  CBC: Recent Labs  Lab 05/13/21 1605  WBC 8.7  HGB 12.6*  HCT 37.6*  MCV 87.2  PLT 545   Basic Metabolic Panel: Recent Labs  Lab 05/13/21 1611 05/14/21 0440 05/15/21 0521 05/16/21 0500  NA 130* 129* 132* 129*  K 3.5 3.6 3.2* 3.8  CL 87* 87* 90* 89*  CO2 30 29 34* 33*  GLUCOSE 179* 163* 182* 191*  BUN 50* 46* 40* 37*  CREATININE 2.18* 2.09* 2.02* 1.87*  CALCIUM 8.6* 8.7* 8.8* 8.8*  MG  --   --  1.9 1.9    Studies: No results found.  Scheduled Meds: . aspirin EC  81 mg Oral Daily  . atorvastatin  80 mg Oral Daily  . Chlorhexidine Gluconate Cloth  6 each Topical Daily  . digoxin  0.125 mg Oral Daily  . enoxaparin (LOVENOX) injection  40 mg Subcutaneous Q24H  . furosemide  80 mg Intravenous BID  . ivabradine  5 mg Oral BID WC  . nicotine  7 mg Transdermal Daily  . polyethylene glycol  17 g Oral BID  . potassium chloride  40 mEq Oral BID  . senna-docusate  1 tablet Oral BID  . sodium chloride flush  10-40 mL Intracatheter Q12H  . sodium chloride flush  3 mL Intravenous Q12H  . spironolactone  12.5 mg Oral Daily   Continuous Infusions: .  sodium chloride    . milrinone 0.25 mcg/kg/min (05/16/21 1852)   PRN Meds: sodium chloride, ipratropium-albuterol, sodium chloride flush, sodium chloride flush  Time spent: 35 minutes  Author: Berle Mull, MD [facility] 05/16/2021 7:10 PM  To reach On-call, see care teams to locate the attending and reach out via [email or web address]. Between 7PM-7AM, please contact night-coverage If you still have difficulty reaching the attending provider, please page the [facility] (Director on Call) for [facility] on amion for assistance.

## 2021-05-16 NOTE — Progress Notes (Signed)
Patient ID: Tyler Wong, male   DOB: [birth year], 55 y.o.   MRN: [id number]     Advanced Heart Failure Rounding Note  PCP-Cardiologist: Shelva Majestic, MD   Subjective:    PICC placed. Initial co-ox 51%.  Co-ox 56% today on milrinone 0.25.  Good UOP, weight down 6 lbs.  Creatinine down to 1.87.  Na lower at 129. CVP remains 17.   SBP upper 90s-low 100s  Objective:   Weight Range: 73.7 kg Body mass index is 23.31 kg/m.   Vital Signs:   Temp:  [97.5 F (36.4 C)-98.6 F (37 C)] 98.1 F (36.7 C) (05/20 0733) Pulse Rate:  [106-114] 114 (05/20 0733) Resp:  [16-27] 24 (05/20 0425) BP: (98-142)/(69-108) 98/76 (05/20 0425) SpO2:  [96 %-100 %] 100 % (05/20 0733) Weight:  [73.7 kg] 73.7 kg (05/20 0600) Last BM Date: 05/14/21  Weight change: Filed Weights   05/14/21 0422 05/15/21 0521 05/16/21 0600  Weight: 78.3 kg 76.3 kg 73.7 kg    Intake/Output:   Intake/Output Summary (Last 24 hours) at 05/16/2021 0820 Last data filed at 05/16/2021 0758 Gross per 24 hour  Intake 1218.58 ml  Output 5400 ml  Net -4181.42 ml      Physical Exam    CVP 17  General: NAD Neck: JVP 16, no thyromegaly or thyroid nodule.  Lungs: Clear to auscultation bilaterally with normal respiratory effort. CV: Lateral PMI.  Heart regular S1/S2, no S3/S4, 3/6 HSM apex.  1+ edema to knees. Abdomen: Soft, nontender, no hepatosplenomegaly, no distention.  Skin: Intact without lesions or rashes.  Neurologic: Alert and oriented x 3.  Psych: Normal affect. Extremities: No clubbing or cyanosis.  HEENT: Normal.    Telemetry   Sinus tach w/ occasional PVCs (personally reviewed)  EKG    No new EKG to review   Labs    CBC Recent Labs    05/13/21 1605  WBC 8.7  HGB 12.6*  HCT 37.6*  MCV 87.2  PLT 606   Basic Metabolic Panel Recent Labs    05/15/21 0521 05/16/21 0500  NA 132* 129*  K 3.2* 3.8  CL 90* 89*  CO2 34* 33*  GLUCOSE 182* 191*  BUN 40* 37*  CREATININE 2.02* 1.87*  CALCIUM 8.8*  8.8*  MG 1.9 1.9   Liver Function Tests Recent Labs    05/14/21 0440 05/16/21 0500  AST 81* 36  ALT 232* 134*  ALKPHOS 219* 175*  BILITOT 2.0* 1.4*  PROT 6.6 5.9*  ALBUMIN 3.0* 2.6*   Recent Labs    05/13/21 1611  LIPASE 54*   Cardiac Enzymes No results for input(s): CKTOTAL, CKMB, CKMBINDEX, TROPONINI in the last 72 hours.  BNP: BNP (last 3 results) Recent Labs    10/31/20 1604 11/12/20 1054 05/13/21 1611  BNP 1,322.2* 970.2* 1,976.6*    ProBNP (last 3 results) No results for input(s): PROBNP in the last 8760 hours.   D-Dimer No results for input(s): DDIMER in the last 72 hours. Hemoglobin A1C No results for input(s): HGBA1C in the last 72 hours. Fasting Lipid Panel No results for input(s): CHOL, HDL, LDLCALC, TRIG, CHOLHDL, LDLDIRECT in the last 72 hours. Thyroid Function Tests No results for input(s): TSH, T4TOTAL, T3FREE, THYROIDAB in the last 72 hours.  Invalid input(s): FREET3  Other results:   Imaging    No results found.   Medications:     Scheduled Medications: . aspirin EC  81 mg Oral Daily  . Chlorhexidine Gluconate Cloth  6 each Topical Daily  .  digoxin  0.125 mg Oral Daily  . enoxaparin (LOVENOX) injection  40 mg Subcutaneous Q24H  . furosemide  80 mg Intravenous BID  . ivabradine  5 mg Oral BID WC  . metolazone  2.5 mg Oral Once  . nicotine  7 mg Transdermal Daily  . potassium chloride  40 mEq Oral BID  . sodium chloride flush  10-40 mL Intracatheter Q12H  . sodium chloride flush  3 mL Intravenous Q12H  . spironolactone  12.5 mg Oral Daily    Infusions: . sodium chloride    . milrinone 0.25 mcg/kg/min (05/16/21 0545)    PRN Medications: sodium chloride, ipratropium-albuterol, polyethylene glycol, sodium chloride flush, sodium chloride flush   Assessment/Plan   1. Acute on chronic systolic DU:KGURKYHC MI in 2015, DES to LAD.Echo in 2015 with EF 40-45%. Cath in 10/19 showed 90% ostial OM1 and 70% PLV, no  interventional target. 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 LastEcho 10/21 showed EF 20-25% with mild RV dysfunction.He has a strong family history of CHF/cardiomyopathy, so may be a component of familial cardiomyopathy.Cocaine likely plays a role in his cardiomyopathy.Multiple re-admits forcardiogenic shock requiring milrinone. Unfortunately, not a candidate for advanced therapies or home milrinone with active use of cocaine, homelessness, and gastric cancer. He has end-stage HF. Now readmitted w/ volume overloaded.  He has been sporadic with his meds and is cocaine+. Creatinine up to 2.09 initially, well above baseline. Initial poor response to IV Lasix raising concern for low output => co-ox low on PICC. Started on milrinone 0.25, co-ox 56% this morning. Good diuresis yesterday with CVP 17.   - Continue milrinone at 0.25 while in hospital to help with diuresis but he cannot go home on milrinone given drug use  - Continue IV Lasix 80 mg bid and give another dose of metolazone 2.5 mg x 1. Replace K.  - Given social situation, gastric cancer,and ongoing substance abuse,he is not a candidate for any advanced therapies including home milrinone - Can continue home ivabradine 5 mg tid.  - Hold Entresto, Jardiance with elevated creatinine/soft BP.  - Will start spironolactone 12.5 mg daily with improved creatinine.  - Will start digoxin 0.125 daily with improved creatinine.   - No ?blocker w/ h/o low output and cocaine use - Unna boots. 2. CAD: H/o anterior STEMI with DES to LAD in 7/15. Cath in 10/19 with 90% ostial OM1 and 70% PLV, no interventional target.No chest pain currently.  HS-TnI mildly elevated with no trend, suspect demand ischemia with volume overload and low output. -ContinueASA 81 and atorvastatin 80 mg daily.  - needs to refrain from using cocaine 3. Cocaine abuse:Has been +cocaine each hospital visit. Encouraged abstinence.  4. Active  smoker:Urged cessation.  5. AKI on CKD Stage 3: Creatinine improved on milrinone. Cardiorenal syndrome, continue milrinone while inpatient.  6. Mitral Regurgitation: Moderate to severe on 10/21 echo, suspect functional MR from dilated CMP. -Not great Mitraclip candidate with cocaine abuse and compliance issues.  7. Gastric Cancer: Intramucosal adenocarcinoma.Has had FOLFOX chemo.  Was supposed to get radiation but has not been compliant with followup. Will need followup with heme/onc.   8. Elevated LFTs: Suspect congestive hepatopathy, improved.  9. Hyponatremia: 129 today. Hypervolemic hyponatremia.   - Fluid restrict.   Length of Stay: 3  Loralie Champagne, MD  05/16/2021, 8:20 AM  Advanced Heart Failure Team Pager [phone] (M-F; 7a - 5p)  Please contact [facility] for night-coverage after hours (5p -7a ) and weekends on [email or web address]

## 2021-05-16 NOTE — Progress Notes (Signed)
   05/15/21 2100  Assess: MEWS Score  Temp 98.3 F (36.8 C)  BP 106/69  Pulse Rate (!) 111  ECG Heart Rate (!) 111  Resp 20  Level of Consciousness Alert  SpO2 98 %  O2 Device Room Air  Assess: MEWS Score  MEWS Temp 0  MEWS Systolic 0  MEWS Pulse 2  MEWS RR 0  MEWS LOC 0  MEWS Score 2  MEWS Score Color Yellow  Assess: if the MEWS score is Yellow or Red  Were vital signs taken at a resting state? Yes  Focused Assessment No change from prior assessment  Early Detection of Sepsis Score *See Row Information* Low  MEWS guidelines implemented *See Row Information* No, previously yellow, continue vital signs every 4 hours  Treat  MEWS Interventions Administered scheduled meds/treatments  Pain Scale 0-10  Pain Score 0

## 2021-05-17 ENCOUNTER — Inpatient Hospital Stay (HOSPITAL_COMMUNITY): Payer: Medicaid Other

## 2021-05-17 DIAGNOSIS — I5043 Acute on chronic combined systolic (congestive) and diastolic (congestive) heart failure: Secondary | ICD-10-CM | POA: Diagnosis not present

## 2021-05-17 LAB — BASIC METABOLIC PANEL
Anion gap: 8 (ref 5–15)
BUN: 38 mg/dL — ABNORMAL HIGH (ref 6–20)
CO2: 34 mmol/L — ABNORMAL HIGH (ref 22–32)
Calcium: 9.2 mg/dL (ref 8.9–10.3)
Chloride: 88 mmol/L — ABNORMAL LOW (ref 98–111)
Creatinine, Ser: 1.9 mg/dL — ABNORMAL HIGH (ref 0.61–1.24)
GFR, Estimated: 41 mL/min — ABNORMAL LOW (ref >60)
Glucose, Bld: 141 mg/dL — ABNORMAL HIGH (ref 70–99)
Potassium: 4.3 mmol/L (ref 3.5–5.1)
Sodium: 130 mmol/L — ABNORMAL LOW (ref 135–145)

## 2021-05-17 LAB — COOXEMETRY PANEL
Carboxyhemoglobin: 1.1 % (ref 0.5–1.5)
Methemoglobin: 0.9 % (ref 0.0–1.5)
O2 Saturation: 51 %
Total hemoglobin: 12.2 g/dL (ref 12.0–16.0)

## 2021-05-17 LAB — MAGNESIUM: Magnesium: 1.9 mg/dL (ref 1.7–2.4)

## 2021-05-17 MED ORDER — ACETAMINOPHEN 325 MG PO TABS
650.0000 mg | ORAL_TABLET | Freq: Once | ORAL | Status: AC
  Administered 2021-05-17: 650 mg via ORAL
  Filled 2021-05-17: qty 2

## 2021-05-17 MED ORDER — TORSEMIDE 20 MG PO TABS
80.0000 mg | ORAL_TABLET | Freq: Two times a day (BID) | ORAL | Status: DC
  Administered 2021-05-17 – 2021-05-19 (×4): 80 mg via ORAL
  Filled 2021-05-17 (×4): qty 4

## 2021-05-17 MED ORDER — METHOCARBAMOL 500 MG PO TABS
500.0000 mg | ORAL_TABLET | Freq: Four times a day (QID) | ORAL | Status: DC | PRN
  Administered 2021-05-17 – 2021-05-20 (×2): 500 mg via ORAL
  Filled 2021-05-17 (×2): qty 1

## 2021-05-17 NOTE — Progress Notes (Signed)
Patient ID: Tyler Wong, male   DOB: 07/05/1966, 55 y.o.   MRN: [id number]     Advanced Heart Failure Rounding Note  PCP-Cardiologist: Shelva Majestic, MD   Subjective:    Co-ox 51% today on milrinone 0.25.  He diuresed well again yesterday on Lasix IV + metolazone.  Weight down 6 lbs. CVP 6 this morning. Creatinine stable at 1.9.  SBP 100s.  Feels better.   Objective:   Weight Range: 71.1 kg Body mass index is 22.5 kg/m.   Vital Signs:   Temp:  [97.3 F (36.3 C)-98.4 F (36.9 C)] 97.6 F (36.4 C) (05/21 0900) Pulse Rate:  [99-120] 106 (05/21 0900) Resp:  [18-20] 20 (05/21 0900) BP: (99-114)/(65-83) 114/81 (05/21 0900) SpO2:  [97 %-99 %] 97 % (05/21 0900) Weight:  [71.1 kg] 71.1 kg (05/21 0428) Last BM Date: 05/14/21  Weight change: Filed Weights   05/15/21 0521 05/16/21 0600 05/17/21 0428  Weight: 76.3 kg 73.7 kg 71.1 kg    Intake/Output:   Intake/Output Summary (Last 24 hours) at 05/17/2021 1112 Last data filed at 05/17/2021 1031 Gross per 24 hour  Intake 1354.73 ml  Output 4075 ml  Net -2720.27 ml      Physical Exam    CVP 6  General: NAD Neck: No JVD, no thyromegaly or thyroid nodule.  Lungs: Clear to auscultation bilaterally with normal respiratory effort. CV: Lateral PMI.  Heart regular S1/S2, no S3/S4, 2/6 HSM apex.  1+ edema of feet.   Abdomen: Soft, nontender, no hepatosplenomegaly, no distention.  Skin: Intact without lesions or rashes.  Neurologic: Alert and oriented x 3.  Psych: Normal affect. Extremities: No clubbing or cyanosis.  HEENT: Normal.    Telemetry   Sinus tach w/ occasional PVCs (personally reviewed)  EKG    No new EKG to review   Labs    CBC No results for input(s): WBC, NEUTROABS, HGB, HCT, MCV, PLT in the last 72 hours. Basic Metabolic Panel Recent Labs    05/16/21 0500 05/17/21 0438  NA 129* 130*  K 3.8 4.3  CL 89* 88*  CO2 33* 34*  GLUCOSE 191* 141*  BUN 37* 38*  CREATININE 1.87* 1.90*  CALCIUM 8.8* 9.2  MG  1.9 1.9   Liver Function Tests Recent Labs    05/16/21 0500  AST 36  ALT 134*  ALKPHOS 175*  BILITOT 1.4*  PROT 5.9*  ALBUMIN 2.6*   No results for input(s): LIPASE, AMYLASE in the last 72 hours. Cardiac Enzymes No results for input(s): CKTOTAL, CKMB, CKMBINDEX, TROPONINI in the last 72 hours.  BNP: BNP (last 3 results) Recent Labs    10/31/20 1604 11/12/20 1054 05/13/21 1611  BNP 1,322.2* 970.2* 1,976.6*    ProBNP (last 3 results) No results for input(s): PROBNP in the last 8760 hours.   D-Dimer No results for input(s): DDIMER in the last 72 hours. Hemoglobin A1C No results for input(s): HGBA1C in the last 72 hours. Fasting Lipid Panel No results for input(s): CHOL, HDL, LDLCALC, TRIG, CHOLHDL, LDLDIRECT in the last 72 hours. Thyroid Function Tests No results for input(s): TSH, T4TOTAL, T3FREE, THYROIDAB in the last 72 hours.  Invalid input(s): FREET3  Other results:   Imaging    No results found.   Medications:     Scheduled Medications: . aspirin EC  81 mg Oral Daily  . atorvastatin  80 mg Oral Daily  . Chlorhexidine Gluconate Cloth  6 each Topical Daily  . digoxin  0.125 mg Oral Daily  . enoxaparin (  LOVENOX) injection  40 mg Subcutaneous Q24H  . ivabradine  5 mg Oral BID WC  . nicotine  7 mg Transdermal Daily  . polyethylene glycol  17 g Oral BID  . potassium chloride  40 mEq Oral BID  . senna-docusate  1 tablet Oral BID  . sodium chloride flush  10-40 mL Intracatheter Q12H  . sodium chloride flush  3 mL Intravenous Q12H  . spironolactone  12.5 mg Oral Daily  . torsemide  80 mg Oral BID    Infusions: . sodium chloride    . milrinone 0.25 mcg/kg/min (05/16/21 1928)    PRN Medications: sodium chloride, ipratropium-albuterol, sodium chloride flush, sodium chloride flush   Assessment/Plan   1. Acute on chronic systolic IO:[id number] MI in 2015, DES to LAD.Echo in 2015 with EF 40-45%. Cath in 10/19 showed 90% ostial OM1 and 70% PLV,  no interventional target. 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 LastEcho 10/21 showed EF 20-25% with mild RV dysfunction.He has a strong family history of CHF/cardiomyopathy, so may be a component of familial cardiomyopathy.Cocaine likely plays a role in his cardiomyopathy.Multiple re-admits forcardiogenic shock requiring milrinone. Unfortunately, not a candidate for advanced therapies or home milrinone with active use of cocaine, homelessness, and gastric cancer. He has end-stage HF. Now readmitted w/ volume overloaded.  He has been sporadic with his meds and is cocaine+. Creatinine up to 2.09 initially, well above baseline. Initial poor response to IV Lasix raising concern for low output => co-ox low on PICC. Started on milrinone 0.25, co-ox 51% this morning. Good diuresis yesterday with CVP down to 6.   - Stop IV Lasix and metolazone, start torsemide 80 mg bid.  - Slow wean of milrinone, decrease to 0.125 today.  Have been using in hospital to help with diuresis but he cannot go home on milrinone given drug use  - Given social situation, gastric cancer,and ongoing substance abuse,he is not a candidate for any advanced therapies including home milrinone - Can continue home ivabradine 5 mg tid.  - Hold Entresto, Jardiance with elevated creatinine/soft BP.  - Continue spironolactone 12.5 mg daily.  - Continue digoxin 0.125 daily.   - No ?blocker w/ h/o low output and cocaine use 2. CAD: H/o anterior STEMI with DES to LAD in 7/15. Cath in 10/19 with 90% ostial OM1 and 70% PLV, no interventional target.No chest pain currently.  HS-TnI mildly elevated with no trend, suspect demand ischemia with volume overload and low output. -ContinueASA 81 and atorvastatin 80 mg daily.  - needs to refrain from using cocaine 3. Cocaine abuse:Has been +cocaine each hospital visit. Encouraged abstinence.  4. Active smoker:Urged cessation.  5. AKI on CKD Stage 3: Creatinine  improved on milrinone. Cardiorenal syndrome, continue milrinone while inpatient.  6. Mitral Regurgitation: Moderate to severe on 10/21 echo, suspect functional MR from dilated CMP. -Not great Mitraclip candidate with cocaine abuse and compliance issues.  7. Gastric Cancer: Intramucosal adenocarcinoma.Has had FOLFOX chemo.  Was supposed to get radiation but has not been compliant with followup. Will need followup with heme/onc.   8. Elevated LFTs: Suspect congestive hepatopathy, improved.  9. Hyponatremia: 130 today. Hypervolemic hyponatremia.   - Fluid restrict.   Length of Stay: 4  Loralie Champagne, MD  05/17/2021, 11:12 AM  Advanced Heart Failure Team Pager [phone] (M-F; 7a - 5p)  Please contact [facility] for night-coverage after hours (5p -7a ) and weekends on [email or web address]

## 2021-05-17 NOTE — Progress Notes (Signed)
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  Currently plan is treat with diuretics and milrinone.  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  Followed by heart failure team and not a candidate for advanced therapies or home milrinone secondary to ongoing cocaine abuse and underlying gastric cancer.  Appreciate palliative consultation. Patient has a PICC line and started on Primacor. Also on IV Lasix 80 mg twice daily along with Zaroxolyn  Along with that on ivabradine 5 mg 3 times daily, Aldactone 12.5 mg and digoxin 0.125 mg. No beta-blocker due to ongoing cocaine use. Holding [place], [place].due to soft blood pressure and elevated creatinine  Monitor intake and output, daily weights, renal function.  Low-sodium diet with fluid restriction.  No repeat echo per cardiology.  Palliative care consulted.  Chest pain noncardiac. CAD Cocaine use Likely due to decompensated advanced heart failure and ongoing cocaine use.  No plan for invasive therapy.  EKG showing ST/T wave changes; no STEMI. High-sensitivity troponin slightly elevated but stable. Continue aspirin and Lipitor.  AKI on CKD stage IIIa Likely cardiorenal from decompensated heart failure.  BUN 50, creatinine 2.1 (baseline 1.2). -Continue IV diuresis and monitor renal function closely.  Hold spironolactone and Entresto.  Avoid any other  nephrotoxic agents.  Elevated LFTs Likely due to hepatic congestion from decompensated heart failure. -Continue to monitor  Mild hyponatremia Likely due to volume overload. -Continue IV diuresis and monitor sodium level closely.  COPD Stable.  No signs of acute exacerbation. -DuoNeb as needed  Hyperlipidemia Continue Lipitor.  Cocaine abuse He has been positive for cocaine during prior hospital visits as well. -Counseled to quit  Tobacco use -NicoDerm patch and counseled to quit  Gastric cancer -On chemotherapy and followed by Dr. Benay Spice  Goals of care conversation. Ideally will benefit from being on hospice.  Currently full code. Discussed with HC POA on 5/19.  Bilateral leg cramp. Left ankle swelling and warmth. Patient reports bilateral leg current we will add Robaxin. Patient also has bilateral foot pain with left ankle swollen more than the right 1. Will get x-ray to rule out any acute abnormality.  Diet: Cardiac diet DVT Prophylaxis:   Place TED hose Start: 05/14/21 1016 enoxaparin (LOVENOX) injection 40 mg Start: 05/13/21 2245   Advance goals of care discussion: Full code  Family Communication: no family was present at bedside, at the time of interview.  Discussed with sister on the phone.  Disposition:  Status is: Inpatient  Remains inpatient appropriate because:IV treatments appropriate due to intensity of illness or inability to take PO  Dispo: The patient is from: Home              Anticipated d/c is to: Home              Patient currently is not medically stable to d/c.   Difficult to place patient No  Subjective: No nausea no vomiting.  Continues to have shortness of breath and fatigue. Also now starting to  have complaints of bilateral leg pain with left more than right.  Physical Exam:  General: Appear in mild distress, no Rash; Oral Mucosa Clear, moist. no Abnormal Neck Mass Or lumps, Conjunctiva normal  Cardiovascular: S1 and S2  Present, no Murmur, Respiratory: good respiratory effort, Bilateral Air entry present and CTA, no Crackles, no wheezes Abdomen: Bowel Sound present, Soft and no tenderness Extremities: Bilateral improving pedal edema.  Still has ankle edema. Neurology: alert and oriented to time, place, and person affect appropriate. no new focal deficit Gait not checked due to patient safety concerns   Vitals:   05/17/21 1230 05/17/21 1700 05/17/21 1718 05/17/21 1719  BP: 100/78 99/74  104/83  Pulse: (!) 104 (!) 108    Resp: (!) 22 (!) 22 20   Temp: 98.1 F (36.7 C) (!) 97.5 F (36.4 C)    TempSrc: Oral Oral    SpO2: 97% 99% 100%   Weight:      Height:        Intake/Output Summary (Last 24 hours) at 05/17/2021 1935 Last data filed at 05/17/2021 1700 Gross per 24 hour  Intake 1450.22 ml  Output 4325 ml  Net -2874.78 ml   Filed Weights   05/15/21 0521 05/16/21 0600 05/17/21 0428  Weight: 76.3 kg 73.7 kg 71.1 kg    Data Reviewed: I have personally reviewed and interpreted daily labs, tele strips, imaging. I reviewed all nursing notes, pharmacy notes, vitals, pertinent old records I have discussed plan of care as described above with RN and patient/family.  CBC: Recent Labs  Lab 05/13/21 1605  WBC 8.7  HGB 12.6*  HCT 37.6*  MCV 87.2  PLT 062   Basic Metabolic Panel: Recent Labs  Lab 05/13/21 1611 05/14/21 0440 05/15/21 0521 05/16/21 0500 05/17/21 0438  NA 130* 129* 132* 129* 130*  K 3.5 3.6 3.2* 3.8 4.3  CL 87* 87* 90* 89* 88*  CO2 30 29 34* 33* 34*  GLUCOSE 179* 163* 182* 191* 141*  BUN 50* 46* 40* 37* 38*  CREATININE 2.18* 2.09* 2.02* 1.87* 1.90*  CALCIUM 8.6* 8.7* 8.8* 8.8* 9.2  MG  --   --  1.9 1.9 1.9    Studies: DG Ankle 2 Views Left  Result Date: 05/17/2021 CLINICAL DATA:  Left ankle pain and swelling. EXAM: LEFT ANKLE - 2 VIEW COMPARISON:  None. FINDINGS: There is no evidence of fracture or dislocation. Ankle mortise and alignment are preserved. Mild  talonavicular and midfoot degenerative change. No erosion or bone destruction. Mild generalized soft tissue edema. No soft tissue air. IMPRESSION: 1. Mild generalized soft tissue edema. 2. No acute osseous abnormality. 3. Mild talonavicular and midfoot degenerative change. Electronically Signed   By: Keith Rake M.D.   On: 05/17/2021 18:58    Scheduled Meds: . aspirin EC  81 mg Oral Daily  . atorvastatin  80 mg Oral Daily  . Chlorhexidine Gluconate Cloth  6 each Topical Daily  . digoxin  0.125 mg Oral Daily  . enoxaparin (LOVENOX) injection  40 mg Subcutaneous Q24H  . ivabradine  5 mg Oral BID WC  . nicotine  7 mg Transdermal Daily  . polyethylene glycol  17 g Oral BID  . potassium chloride  40 mEq Oral BID  . senna-docusate  1 tablet Oral BID  . sodium chloride flush  10-40 mL Intracatheter Q12H  . sodium chloride flush  3 mL Intravenous Q12H  . spironolactone  12.5 mg Oral Daily  . torsemide  80 mg Oral BID  Continuous Infusions: . sodium chloride    . milrinone 0.125 mcg/kg/min (05/17/21 1521)   PRN Meds: sodium chloride, ipratropium-albuterol, methocarbamol, sodium chloride flush, sodium chloride flush  Time spent: 35 minutes  Author: Berle Mull, MD [facility] 05/17/2021 7:35 PM  To reach On-call, see care teams to locate the attending and reach out via [email or web address]. Between 7PM-7AM, please contact night-coverage If you still have difficulty reaching the attending provider, please page the [facility] (Director on Call) for [facility] on amion for assistance.

## 2021-05-18 DIAGNOSIS — I5023 Acute on chronic systolic (congestive) heart failure: Secondary | ICD-10-CM | POA: Diagnosis not present

## 2021-05-18 LAB — BASIC METABOLIC PANEL
Anion gap: 9 (ref 5–15)
BUN: 43 mg/dL — ABNORMAL HIGH (ref 6–20)
CO2: 32 mmol/L (ref 22–32)
Calcium: 8.8 mg/dL — ABNORMAL LOW (ref 8.9–10.3)
Chloride: 90 mmol/L — ABNORMAL LOW (ref 98–111)
Creatinine, Ser: 1.7 mg/dL — ABNORMAL HIGH (ref 0.61–1.24)
GFR, Estimated: 47 mL/min — ABNORMAL LOW (ref >60)
Glucose, Bld: 126 mg/dL — ABNORMAL HIGH (ref 70–99)
Potassium: 3.8 mmol/L (ref 3.5–5.1)
Sodium: 131 mmol/L — ABNORMAL LOW (ref 135–145)

## 2021-05-18 LAB — COOXEMETRY PANEL
Carboxyhemoglobin: 1.3 % (ref 0.5–1.5)
Methemoglobin: 1 % (ref 0.0–1.5)
O2 Saturation: 65.4 %
Total hemoglobin: 12.2 g/dL (ref 12.0–16.0)

## 2021-05-18 MED ORDER — PREDNISONE 20 MG PO TABS
40.0000 mg | ORAL_TABLET | Freq: Every day | ORAL | Status: DC
  Administered 2021-05-18 – 2021-05-21 (×4): 40 mg via ORAL
  Filled 2021-05-18 (×4): qty 2

## 2021-05-18 MED ORDER — COLCHICINE 0.6 MG PO TABS
0.6000 mg | ORAL_TABLET | Freq: Every day | ORAL | Status: DC
  Administered 2021-05-18 – 2021-05-20 (×3): 0.6 mg via ORAL
  Filled 2021-05-18 (×4): qty 1

## 2021-05-18 NOTE — Progress Notes (Signed)
Patient ID: Tyler Wong, male   DOB: [birth year], 55 y.o.   MRN: [id number]     Advanced Heart Failure Rounding Note  PCP-Cardiologist: Shelva Majestic, MD   Subjective:    Co-ox 65% today on milrinone 0.125.  He diuresed well again yesterday, weight down. CVP 6 this morning. Creatinine lower at 1.7.  SBP 90s.    Main complaint today is left ankle pain.  Does not know if he has had gout before.   Objective:   Weight Range: 69.4 kg Body mass index is 21.94 kg/m.   Vital Signs:   Temp:  [97.5 F (36.4 C)-98.6 F (37 C)] 98.4 F (36.9 C) (05/22 0900) Pulse Rate:  [93-108] 93 (05/22 0958) Resp:  [20-22] 20 (05/22 0958) BP: (92-108)/(50-83) 92/69 (05/22 0958) SpO2:  [96 %-100 %] 96 % (05/22 0900) Weight:  [69.4 kg] 69.4 kg (05/22 0422) Last BM Date: 05/14/21  Weight change: Filed Weights   05/16/21 0600 05/17/21 0428 05/18/21 0422  Weight: 73.7 kg 71.1 kg 69.4 kg    Intake/Output:   Intake/Output Summary (Last 24 hours) at 05/18/2021 1048 Last data filed at 05/18/2021 0959 Gross per 24 hour  Intake 1363.97 ml  Output 5825 ml  Net -4461.03 ml      Physical Exam    CVP 6  General: NAD Neck: No JVD, no thyromegaly or thyroid nodule.  Lungs: Clear to auscultation bilaterally with normal respiratory effort. CV: Lateral PMI.  Heart regular S1/S2, no S3/S4, 3/6 HSM apex.  1+ ankle edema.   Abdomen: Soft, nontender, no hepatosplenomegaly, no distention.  Skin: Intact without lesions or rashes.  Neurologic: Alert and oriented x 3.  Psych: Normal affect. Extremities: No clubbing or cyanosis.  HEENT: Normal.    Telemetry   NSR 90s (personally reviewed)  EKG    No new EKG to review   Labs    CBC No results for input(s): WBC, NEUTROABS, HGB, HCT, MCV, PLT in the last 72 hours. Basic Metabolic Panel Recent Labs    05/16/21 0500 05/17/21 0438 05/18/21 0500  NA 129* 130* 131*  K 3.8 4.3 3.8  CL 89* 88* 90*  CO2 33* 34* 32  GLUCOSE 191* 141* 126*  BUN 37* 38*  43*  CREATININE 1.87* 1.90* 1.70*  CALCIUM 8.8* 9.2 8.8*  MG 1.9 1.9  --    Liver Function Tests Recent Labs    05/16/21 0500  AST 36  ALT 134*  ALKPHOS 175*  BILITOT 1.4*  PROT 5.9*  ALBUMIN 2.6*   No results for input(s): LIPASE, AMYLASE in the last 72 hours. Cardiac Enzymes No results for input(s): CKTOTAL, CKMB, CKMBINDEX, TROPONINI in the last 72 hours.  BNP: BNP (last 3 results) Recent Labs    10/31/20 1604 11/12/20 1054 05/13/21 1611  BNP 1,322.2* 970.2* 1,976.6*    ProBNP (last 3 results) No results for input(s): PROBNP in the last 8760 hours.   D-Dimer No results for input(s): DDIMER in the last 72 hours. Hemoglobin A1C No results for input(s): HGBA1C in the last 72 hours. Fasting Lipid Panel No results for input(s): CHOL, HDL, LDLCALC, TRIG, CHOLHDL, LDLDIRECT in the last 72 hours. Thyroid Function Tests No results for input(s): TSH, T4TOTAL, T3FREE, THYROIDAB in the last 72 hours.  Invalid input(s): FREET3  Other results:   Imaging    DG Ankle 2 Views Left  Result Date: 05/17/2021 CLINICAL DATA:  Left ankle pain and swelling. EXAM: LEFT ANKLE - 2 VIEW COMPARISON:  None. FINDINGS: There is no evidence  of fracture or dislocation. Ankle mortise and alignment are preserved. Mild talonavicular and midfoot degenerative change. No erosion or bone destruction. Mild generalized soft tissue edema. No soft tissue air. IMPRESSION: 1. Mild generalized soft tissue edema. 2. No acute osseous abnormality. 3. Mild talonavicular and midfoot degenerative change. Electronically Signed   By: Keith Rake M.D.   On: 05/17/2021 18:58     Medications:     Scheduled Medications: . aspirin EC  81 mg Oral Daily  . atorvastatin  80 mg Oral Daily  . Chlorhexidine Gluconate Cloth  6 each Topical Daily  . colchicine  0.6 mg Oral Daily  . digoxin  0.125 mg Oral Daily  . enoxaparin (LOVENOX) injection  40 mg Subcutaneous Q24H  . ivabradine  5 mg Oral BID WC  .  nicotine  7 mg Transdermal Daily  . polyethylene glycol  17 g Oral BID  . potassium chloride  40 mEq Oral BID  . predniSONE  40 mg Oral Q breakfast  . senna-docusate  1 tablet Oral BID  . sodium chloride flush  10-40 mL Intracatheter Q12H  . sodium chloride flush  3 mL Intravenous Q12H  . spironolactone  12.5 mg Oral Daily  . torsemide  80 mg Oral BID    Infusions: . sodium chloride      PRN Medications: sodium chloride, ipratropium-albuterol, methocarbamol, sodium chloride flush, sodium chloride flush   Assessment/Plan   1. Acute on chronic systolic NT:[id number] MI in 2015, DES to LAD.Echo in 2015 with EF 40-45%. Cath in 10/19 showed 90% ostial OM1 and 70% PLV, no interventional target. 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 LastEcho 10/21 showed EF 20-25% with mild RV dysfunction.He has a strong family history of CHF/cardiomyopathy, so may be a component of familial cardiomyopathy.Cocaine likely plays a role in his cardiomyopathy.Multiple re-admits forcardiogenic shock requiring milrinone. Unfortunately, not a candidate for advanced therapies or home milrinone with active use of cocaine, homelessness, and gastric cancer. He has end-stage HF. Now readmitted w/ volume overloaded.  He has been sporadic with his meds and is cocaine+. Creatinine up to 2.09 initially, well above baseline. Initial poor response to IV Lasix raising concern for low output => co-ox low on PICC. Started on milrinone 0.25 then weaned to 0.125, co-ox 65% this morning. Good diuresis again yesterday with CVP down to 6.   - Continue torsemide 80 mg bid for home.  - Stop milrinone.  Have been using in hospital to help with diuresis but he cannot go home on milrinone given drug use  - Given social situation, gastric cancer,and ongoing substance abuse,he is not a candidate for any advanced therapies including home milrinone - Can continue home ivabradine 5 mg tid.  - Start back on  Jardiance 10 mg daily.  - Continue spironolactone 12.5 mg daily. No BP room to increase. - No BP room for Entresto or Bidil.  - Continue digoxin 0.125 daily.   - No ?blocker w/ h/o low output and cocaine use 2. CAD: H/o anterior STEMI with DES to LAD in 7/15. Cath in 10/19 with 90% ostial OM1 and 70% PLV, no interventional target.No chest pain currently.  HS-TnI mildly elevated with no trend, suspect demand ischemia with volume overload and low output. -ContinueASA 81 and atorvastatin 80 mg daily.  - needs to refrain from using cocaine 3. Cocaine abuse:Has been +cocaine each hospital visit. Encouraged abstinence.  4. Active smoker:Urged cessation.  5. AKI on CKD Stage 3: Creatinine improved on milrinone. Cardiorenal syndrome, continue milrinone while inpatient.  6.  Mitral Regurgitation: Moderate to severe on 10/21 echo, suspect functional MR from dilated CMP. -Not great Mitraclip candidate with cocaine abuse and compliance issues.  7. Gastric Cancer: Intramucosal adenocarcinoma.Has had FOLFOX chemo.  Was supposed to get radiation but has not been compliant with followup. Will need followup with heme/onc.   8. Elevated LFTs: Suspect congestive hepatopathy, improved.  9. Hyponatremia: 131 today. Hypervolemic hyponatremia.   - Fluid restrict.  10. Left ankle pain: Possible gout.  - Send uric acid.  - Can get colchicine 0.6 mg daily.  - Would give him a prednisone burst, 40 mg today with taper over 5 days.   From my standpoint, can go home if he does ok off milrinone.  As noted above, not a candidate for home milrinone or advanced therapies.   Length of Stay: [place], MD  05/18/2021, 10:48 AM  Advanced Heart Failure Team Pager [phone] (M-F; 7a - 5p)  Please contact [facility] for night-coverage after hours (5p -7a ) and weekends on [email or web address]

## 2021-05-18 NOTE — Progress Notes (Signed)
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  Currently plan is monitor overnight for stability off of Primacor.  Assessment and Plan: Acute exacerbation of chronic combined systolic and diastolic CHF end-stage heart failure Moderate to severe mitral regurgitation Mixed ischemic/nonischemic (cocaine) cardiomyopathy.  Followed by heart failure team and not a candidate for advanced therapies or home milrinone secondary to ongoing cocaine abuse and underlying gastric cancer.  Appreciate palliative consultation. Patient has a PICC line and started on Primacor. Also on IV Lasix 80 mg twice daily along with Zaroxolyn  Along with that on ivabradine 5 mg 3 times daily, Aldactone 12.5 mg and digoxin 0.125 mg. No beta-blocker due to ongoing cocaine use. Holding [place], [place].due to soft blood pressure and elevated creatinine  Monitor intake and output, daily weights, renal function.  Low-sodium diet with fluid restriction.  No repeat echo per cardiology.  Palliative care consulted. Currently Primacor and IV Lasix is stopped. On torsemide 80 mg twice daily for home. Will start Jardiance 10 mg daily on home. Continue ivabradine, Aldactone and digoxin as well.  Chest pain noncardiac. CAD Cocaine use Likely due to decompensated advanced heart failure and ongoing cocaine use.  No plan for invasive therapy.  EKG showing ST/T wave changes; no STEMI. High-sensitivity troponin slightly elevated but stable. Continue aspirin and  Lipitor.  AKI on CKD stage IIIa Likely cardiorenal from decompensated heart failure.  BUN 50, creatinine 2.1 (baseline 1.2). Renal function improved with diuresis. Avoid any other nephrotoxic agents.  Elevated LFTs Likely due to hepatic congestion from decompensated heart failure. -Continue to monitor  Mild hyponatremia Likely due to volume overload. -Continue IV diuresis and monitor sodium level closely.  COPD Stable.  No signs of acute exacerbation. -DuoNeb as needed  Hyperlipidemia Continue Lipitor.  Cocaine abuse He has been positive for cocaine during prior hospital visits as well. -Counseled to quit  Tobacco use -NicoDerm patch and counseled to quit  Gastric cancer -On chemotherapy and followed by Dr. Benay Spice  Goals of care conversation. Ideally will benefit from being on hospice.  Currently full code. Discussed with HC POA on 5/19.  Bilateral leg cramp. Left ankle gout Left ankle swelling and warmth. Patient reports bilateral leg current we will add Robaxin. Patient also has bilateral foot pain with left ankle swollen more than the right 1. X-ray negative for any fracture. Started on prednisone.  Monitor.  Diet: Cardiac diet DVT Prophylaxis:   Place TED hose Start: 05/14/21 1016 enoxaparin (LOVENOX) injection 40 mg Start: 05/13/21 2245   Advance goals of care discussion: Full code  Family Communication: no family was present at bedside, at the time of interview.  Discussed with sister on the phone.  Disposition:  Status is: Inpatient  Remains inpatient appropriate because:IV treatments appropriate due to intensity of illness or inability to take PO  Dispo: The patient is from: Home              Anticipated d/c is to: Home              Patient currently  is not medically stable to d/c.   Difficult to place patient No  Subjective: Continues to have some pain in the left leg.  No nausea no vomiting.  No fever no chills.  Breathing improving.   No dizziness or lightheadedness.  Physical Exam:  General: Appear in mild distress, no Rash; Oral Mucosa Clear, moist. no Abnormal Neck Mass Or lumps, Conjunctiva normal  Cardiovascular: S1 and S2 Present, no Murmur, Respiratory: good respiratory effort, Bilateral Air entry present and CTA, no Crackles, no wheezes Abdomen: Bowel Sound present, Soft and no tenderness Extremities: Bilateral improving pedal edema Neurology: alert and oriented to time, place, and person affect appropriate. no new focal deficit Gait not checked due to patient safety concerns  Vitals:   05/18/21 0900 05/18/21 0958 05/18/21 1555 05/18/21 1928  BP: 98/68 92/69 90/72  92/61  Pulse: (!) 101 93 (!) 101   Resp: (!) 22 20 19 20   Temp: 98.4 F (36.9 C)  (!) 97.4 F (36.3 C) 98.2 F (36.8 C)  TempSrc: Oral  Oral Oral  SpO2: 96%  96% 95%  Weight:      Height:        Intake/Output Summary (Last 24 hours) at 05/18/2021 1931 Last data filed at 05/18/2021 1925 Gross per 24 hour  Intake 1544.97 ml  Output 5900 ml  Net -4355.03 ml   Filed Weights   05/16/21 0600 05/17/21 0428 05/18/21 0422  Weight: 73.7 kg 71.1 kg 69.4 kg    Data Reviewed: I have personally reviewed and interpreted daily labs, tele strips, imaging. I reviewed all nursing notes, pharmacy notes, vitals, pertinent old records I have discussed plan of care as described above with RN and patient/family.  CBC: Recent Labs  Lab 05/13/21 1605  WBC 8.7  HGB 12.6*  HCT 37.6*  MCV 87.2  PLT 332   Basic Metabolic Panel: Recent Labs  Lab 05/14/21 0440 05/15/21 0521 05/16/21 0500 05/17/21 0438 05/18/21 0500  NA 129* 132* 129* 130* 131*  K 3.6 3.2* 3.8 4.3 3.8  CL 87* 90* 89* 88* 90*  CO2 29 34* 33* 34* 32  GLUCOSE 163* 182* 191* 141* 126*  BUN 46* 40* 37* 38* 43*  CREATININE 2.09* 2.02* 1.87* 1.90* 1.70*  CALCIUM 8.7* 8.8* 8.8* 9.2 8.8*  MG  --  1.9 1.9 1.9  --     Studies: No results found.  Scheduled Meds: . aspirin EC  81 mg  Oral Daily  . atorvastatin  80 mg Oral Daily  . Chlorhexidine Gluconate Cloth  6 each Topical Daily  . colchicine  0.6 mg Oral Daily  . digoxin  0.125 mg Oral Daily  . enoxaparin (LOVENOX) injection  40 mg Subcutaneous Q24H  . ivabradine  5 mg Oral BID WC  . nicotine  7 mg Transdermal Daily  . polyethylene glycol  17 g Oral BID  . potassium chloride  40 mEq Oral BID  . predniSONE  40 mg Oral Q breakfast  . senna-docusate  1 tablet Oral BID  . sodium chloride flush  10-40 mL Intracatheter Q12H  . sodium chloride flush  3 mL Intravenous Q12H  . spironolactone  12.5 mg Oral Daily  . torsemide  80 mg Oral BID   Continuous Infusions: . sodium chloride     PRN Meds: sodium chloride, ipratropium-albuterol, methocarbamol, sodium chloride flush, sodium chloride flush  Time spent: 35 minutes  Author: Berle Mull, MD [facility] 05/18/2021 7:31 PM  To reach On-call, see care teams to locate the attending and  reach out via [email or web address]. Between 7PM-7AM, please contact night-coverage If you still have difficulty reaching the attending provider, please page the [facility] (Director on Call) for [facility] on amion for assistance.

## 2021-05-19 ENCOUNTER — Telehealth (HOSPITAL_COMMUNITY): Payer: Self-pay | Admitting: Licensed Clinical Social Worker

## 2021-05-19 ENCOUNTER — Ambulatory Visit: Payer: Medicaid Other

## 2021-05-19 DIAGNOSIS — I5043 Acute on chronic combined systolic (congestive) and diastolic (congestive) heart failure: Secondary | ICD-10-CM | POA: Diagnosis not present

## 2021-05-19 LAB — BASIC METABOLIC PANEL
Anion gap: 10 (ref 5–15)
BUN: 48 mg/dL — ABNORMAL HIGH (ref 6–20)
CO2: 31 mmol/L (ref 22–32)
Calcium: 9.4 mg/dL (ref 8.9–10.3)
Chloride: 93 mmol/L — ABNORMAL LOW (ref 98–111)
Creatinine, Ser: 1.94 mg/dL — ABNORMAL HIGH (ref 0.61–1.24)
GFR, Estimated: 40 mL/min — ABNORMAL LOW (ref >60)
Glucose, Bld: 115 mg/dL — ABNORMAL HIGH (ref 70–99)
Potassium: 4.4 mmol/L (ref 3.5–5.1)
Sodium: 134 mmol/L — ABNORMAL LOW (ref 135–145)

## 2021-05-19 LAB — COOXEMETRY PANEL
Carboxyhemoglobin: 1 % (ref 0.5–1.5)
Methemoglobin: 1.1 % (ref 0.0–1.5)
O2 Saturation: 59.5 %
Total hemoglobin: 13.8 g/dL (ref 12.0–16.0)

## 2021-05-19 LAB — URIC ACID: Uric Acid, Serum: 11.5 mg/dL — ABNORMAL HIGH (ref 3.7–8.6)

## 2021-05-19 MED ORDER — TORSEMIDE 20 MG PO TABS: 60.0000 mg | ORAL_TABLET | Freq: Two times a day (BID) | ORAL | Status: DC

## 2021-05-19 MED ORDER — TORSEMIDE 20 MG PO TABS
80.0000 mg | ORAL_TABLET | Freq: Every day | ORAL | Status: DC
  Administered 2021-05-20: 80 mg via ORAL
  Filled 2021-05-19: qty 4

## 2021-05-19 NOTE — TOC Progression Note (Signed)
Transition of Care [facility]) - Progression Note    Patient Details  Name: Tyler Wong MRN: [id number] Date of Birth: 1966-11-07  [facility]) CM/SW Contact  Zenon Mayo, RN Phone Number: 05/19/2021, 9:53 AM  Clinical Narrative:    NCM spoke with patient , he states he has not seen a PCP in over a year, he said it will be alright to set him up with the CHW clinic for a follow up.  NCM spoke with [place] with HF team she has not heard back from the [facility] or his insurance, so if he is dc today, will need shelter list, he his homeless and will need [facility].    Barriers to Discharge: Continued Medical Work up,Homeless with medical needs  Expected Discharge Plan and Services   In-house Referral: Clinical Social Work     Living arrangements for the past 2 months: No permanent address,Homeless                                       Social Determinants of Health (SDOH) Interventions Food Insecurity Interventions: Other (Comment) (patient reports he does get food stamps) Financial Strain Interventions: Other (Comment) (patient reported he has tried applying for disability but missed an appointment and is unsure where that stands currently) Housing Interventions: Other (Comment) (outpatient HF CSW working on shelter/housing resources) Transportation Interventions: [facility]  Readmission Risk Interventions Readmission Risk Prevention Plan 11/04/2020 10/09/2020  Transportation Screening Complete Complete  Medication Review Press photographer) Complete Complete  PCP or Specialist appointment within 3-5 days of discharge Complete -  [place] or [place] Complete Complete  SW Recovery Care/Counseling Consult Complete Complete  Palliative Care Screening Not Applicable Not [place] Not Applicable Not Applicable  Some recent data might be hidden

## 2021-05-19 NOTE — Telephone Encounter (Signed)
[facility] continues to process pt application for shelter- is hopeful to be able to admit pt but there is a waiting list so unsure of timeline- they will have to follow up with pt in outpatient setting as pt is being discharged today- CSW will continue to follow and assist with this process when pt is discharged.  CSW also spoke with [facility] [employer] case worker, Leonia Reeves, who reports they can potentially pay for a short term hotel room (up to 5 nights) to help bridge patient into shelter- she is working on this process but will likely take 1-2 nights to arrange.  Pt made aware of above and understands that he will likely be discharged today- is aware that inpatient [facility] CSW spoke with [employer] [facility] where he was previously and that they would take him back but is against this option at this time.  Pt reports he has a friend that he might can stay with tonight to avoid going back to the streets and being in a situation that he is likely to use.  Pt understands that it is important for him to maintain contact with [place] once he leaves the hospital so we can continue to help him connect with housing options in this area/restart his cancer treatment/ help with his disability case.  Outpatient HF CSW will continue to follow and assist as needed  Tyler Wong, [place] Worker Advanced [facility] Failure Clinic Desk#: [phone] Cell#: [phone]

## 2021-05-19 NOTE — Progress Notes (Signed)
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  Currently plan is monitor overnight for stability renal function.  Assessment and Plan: Acute exacerbation of chronic combined systolic and diastolic CHF end-stage heart failure Moderate to severe mitral regurgitation Mixed ischemic/nonischemic (cocaine) cardiomyopathy. Followed by heart failure team and not a candidate for advanced therapies or home milrinone secondary to ongoing cocaine abuse and underlying gastric cancer.  Appreciate palliative consultation. Patient has a PICC line and started on Primacor. Also on IV Lasix 80 mg twice daily along with Zaroxolyn  Along with that on ivabradine 5 mg 3 times daily, Aldactone 12.5 mg and digoxin 0.125 mg. No beta-blocker due to ongoing cocaine use. Low-sodium diet with fluid restriction.  No repeat echo per cardiology.  Currently Primacor and IV Lasix is stopped. On torsemide 80 mg twice daily for home.  Due to worsening renal function torsemide is reduced to 80 mg daily.  Monitor overnight. Will start Jardiance 10 mg daily on home. Continue ivabradine, Aldactone and digoxin as well.  Chest pain noncardiac. CAD Cocaine use Likely due to decompensated advanced heart failure and ongoing cocaine use.  No plan for invasive therapy.  EKG showing ST/T wave changes; no STEMI. High-sensitivity troponin slightly elevated but stable. Continue aspirin and Lipitor.  AKI on CKD stage IIIa Likely cardiorenal from decompensated heart failure.   BUN 50, creatinine 2.1 (baseline 1.2). Renal function improved with diuresis.  Worsened again on 5/23.  Likely overdiuresis. Avoid any other nephrotoxic agents.  Elevated LFTs Likely due to hepatic congestion from decompensated heart failure. -Continue to monitor  Mild hyponatremia Likely due to volume overload. -Continue IV diuresis and monitor sodium level closely.  COPD Stable.  No signs of acute exacerbation. -DuoNeb as needed  Hyperlipidemia Continue Lipitor.  Cocaine abuse He has been positive for cocaine during prior hospital visits as well. -Counseled to quit  Tobacco use -NicoDerm patch and counseled to quit  Gastric cancer -On chemotherapy and followed by Dr. Benay Spice  Goals of care conversation. Ideally will benefit from being on hospice.  Currently full code. Discussed with HC POA on 5/19.  Bilateral leg cramp. Left ankle gout Left ankle swelling and warmth. Patient reports bilateral leg current we will add Robaxin. Patient also has bilateral foot pain with left ankle swollen more than the right 1. X-ray negative for any fracture. Started on prednisone.  Monitor.  Social situation. Appreciate assistance from all the social worker as well as case management.  Patient may have a better disposition tomorrow and will be able to avoid going back to the street if he has a shelter or accommodation available. Monitor overnight.   Diet: Cardiac diet DVT Prophylaxis:   Place TED hose Start: 05/14/21 1016 enoxaparin (LOVENOX) injection 40 mg Start: 05/13/21 2245   Advance goals of care discussion: Full code  Family Communication: no family was present at bedside, at the time of interview.  Disposition:  Status is: Inpatient  Remains inpatient appropriate because: Renal function worsening.  Monitor for stability.  Dispo: The patient is from: Home  Anticipated d/c is to: Home              Patient currently is not medically stable to  d/c.   Difficult to place patient No  Subjective: Continues to have pain in the leg but improving.  No nausea no vomiting.  Complains of shortness of breath.  Physical Exam:  General: Appear in mild distress, no Rash; Oral Mucosa Clear, moist. no Abnormal Neck Mass Or lumps, Conjunctiva normal  Cardiovascular: S1 and S2 Present, no Murmur, Respiratory: good respiratory effort, Bilateral Air entry present and CTA, no Crackles, no wheezes Abdomen: Bowel Sound present, Soft and no tenderness Extremities: no Pedal edema, improving edema left ankle as well as warmth. Neurology: alert and oriented to time, place, and person affect appropriate. no new focal deficit Gait not checked due to patient safety concerns   Vitals:   05/19/21 0729 05/19/21 1047 05/19/21 1643 05/19/21 1732  BP: 97/74 97/69 97/76  94/67  Pulse: 97 95 (!) 110   Resp: 18 20 20 20   Temp: 97.6 F (36.4 C) 97.6 F (36.4 C) 97.8 F (36.6 C)   TempSrc: Oral Oral Oral   SpO2: 97% 96% 100%   Weight:      Height:        Intake/Output Summary (Last 24 hours) at 05/19/2021 1859 Last data filed at 05/19/2021 1731 Gross per 24 hour  Intake 1514 ml  Output 5475 ml  Net -3961 ml   Filed Weights   05/17/21 0428 05/18/21 0422 05/19/21 0409  Weight: 71.1 kg 69.4 kg 67.3 kg    Data Reviewed: I have personally reviewed and interpreted daily labs, tele strips, imaging. I reviewed all nursing notes, pharmacy notes, vitals, pertinent old records I have discussed plan of care as described above with RN and patient/family.  CBC: Recent Labs  Lab 05/13/21 1605  WBC 8.7  HGB 12.6*  HCT 37.6*  MCV 87.2  PLT 540   Basic Metabolic Panel: Recent Labs  Lab 05/15/21 0521 05/16/21 0500 05/17/21 0438 05/18/21 0500 05/19/21 0535  NA 132* 129* 130* 131* 134*  K 3.2* 3.8 4.3 3.8 4.4  CL 90* 89* 88* 90* 93*  CO2 34* 33* 34* 32 31  GLUCOSE 182* 191* 141* 126* 115*  BUN 40* 37* 38* 43* 48*  CREATININE 2.02* 1.87* 1.90* 1.70*  1.94*  CALCIUM 8.8* 8.8* 9.2 8.8* 9.4  MG 1.9 1.9 1.9  --   --     Studies: No results found.  Scheduled Meds: . aspirin EC  81 mg Oral Daily  . atorvastatin  80 mg Oral Daily  . Chlorhexidine Gluconate Cloth  6 each Topical Daily  . colchicine  0.6 mg Oral Daily  . digoxin  0.125 mg Oral Daily  . enoxaparin (LOVENOX) injection  40 mg Subcutaneous Q24H  . ivabradine  5 mg Oral BID WC  . nicotine  7 mg Transdermal Daily  . polyethylene glycol  17 g Oral BID  . potassium chloride  40 mEq Oral BID  . predniSONE  40 mg Oral Q breakfast  . senna-docusate  1 tablet Oral BID  . sodium chloride flush  10-40 mL Intracatheter Q12H  . sodium chloride flush  3 mL Intravenous Q12H  . spironolactone  12.5 mg Oral Daily  . [START ON 05/20/2021] torsemide  80 mg Oral Daily   Continuous Infusions: . sodium chloride     PRN Meds: sodium chloride, ipratropium-albuterol, methocarbamol, sodium chloride flush, sodium chloride flush  Time spent: 35 minutes  Author: Berle Mull, MD [facility] 05/19/2021 6:59 PM  To reach On-call, see care teams to locate the attending and reach out via [email or web address]. Between 7PM-7AM, please contact night-coverage If you still have difficulty reaching the attending provider, please page the [facility] (Director on Call) for [facility] on amion for assistance.

## 2021-05-19 NOTE — TOC Progression Note (Addendum)
Transition of Care (TOC) - Progression Note  Heart Failure   Patient Details  Name: Travon Crochet MRN: [id number] Date of Birth: Oct 08, 1966  Transition of Care [facility]) CM/SW [place], [place] Phone Number: 05/19/2021, 11:49 AM  Clinical Narrative:    CSW spoke with patient at bedside and brought him shelter and food resources. Mr. Marxen reported he doesn't have a preference for any of the shelters but he is willing to go wherever and does not want to stay at his sisters. [facility]W called [facility], [employer], [employer] and had to leave a Advertising account executive. CSW spoke with [employer] and they do have availability but he would need to work to participate in that program and fill out an application to determine if he would be able to qualify for that program. CSW attempted to contact [employer] but the number was disconnected. Most shelter programs work on a first come first serve basis. CSW will further discuss options with Mr. Kuennen and CSW provided him with a few bus passes.  CSW spoke with the patients nurse who reported receiving a call from the patients sister Bronson Curb [phone] who wanted to talk with the social worker about a discharge plan and possible shelter options. Angelia reported that they are [facility] and she has called them twice today already and hasn't heard anything. She also wondered if he could go back to [facility] in [place] as he has been there before. CS[facility] called [facility] and they are able to take him back as long as he can care for himself and pass a drug test and have a negative COVID test from the hospital. CSW spoke with Mr. Hannold about this option and he reported he does not want to go there and didn't say why but that he will go anywhere else but there. CSW spoke with Bronson Curb [phone] and informed her that [facility] in [place] does have an  opening for him but that he doesn't want to go and she reported she will see what she can do and try to change his mind and get back with the CSW.  CSW called [facility] in [place] and they reported they just filled up for the day but might have availability tomorrow.    Barriers to Discharge: Continued Medical Work up,Homeless with medical needs  Expected Discharge Plan and Services   In-house Referral: Clinical Social Work     Living arrangements for the past 2 months: No permanent address,Homeless                                       Social Determinants of Health (SDOH) Interventions Food Insecurity Interventions: Other (Comment) (patient reports he does get food stamps) Financial Strain Interventions: Other (Comment) (patient reported he has tried applying for disability but missed an appointment and is unsure where that stands currently) Housing Interventions: Other (Comment) (outpatient HF CSW working on shelter/housing resources) Transportation Interventions: [facility]  Readmission Risk Interventions Readmission Risk Prevention Plan 11/04/2020 10/09/2020  Transportation Screening Complete Complete  Medication Review Press photographer) Complete Complete  PCP or Specialist appointment within 3-5 days of discharge Complete -  [place] or [place] Complete Complete  SW Recovery Care/Counseling Consult Complete Complete  Palliative Care Screening Not Applicable Not [place] Not Applicable Not Applicable  Some recent data might be hidden   Vicenta Olds, MSW, [place] Heart Failure Social Worker

## 2021-05-19 NOTE — Progress Notes (Addendum)
Patient ID: Tyler Wong, male   DOB: 1966/04/08, 55 y.o.   MRN: [id number]     Advanced Heart Failure Rounding Note  PCP-Cardiologist: Shelva Majestic, MD   Subjective:    CVP 4 today.   Feels ok. Denies SOB. Not where he is going when he is discharged.   Objective:   Weight Range: 67.3 kg Body mass index is 21.28 kg/m.   Vital Signs:   Temp:  [97.4 F (36.3 C)-98.2 F (36.8 C)] 97.6 F (36.4 C) (05/23 0729) Pulse Rate:  [97-101] 97 (05/23 0729) Resp:  [17-20] 18 (05/23 0729) BP: (90-109)/(61-88) 97/74 (05/23 0729) SpO2:  [95 %-100 %] 97 % (05/23 0729) Weight:  [67.3 kg] 67.3 kg (05/23 0409) Last BM Date: 05/17/21  Weight change: Filed Weights   05/17/21 0428 05/18/21 0422 05/19/21 0409  Weight: 71.1 kg 69.4 kg 67.3 kg    Intake/Output:   Intake/Output Summary (Last 24 hours) at 05/19/2021 1003 Last data filed at 05/19/2021 0826 Gross per 24 hour  Intake 1634.97 ml  Output 4925 ml  Net -3290.03 ml      Physical Exam  CVP 4.   General:  No resp difficulty HEENT: normal Neck: supple. no JVD. Carotids 2+ bilat; no bruits. No lymphadenopathy or thryomegaly appreciated. Cor: PMI nondisplaced. Regular rate & rhythm. No rubs, gallops. 3/6 HSM  Lungs: clear Abdomen: soft, nontender, nondistended. No hepatosplenomegaly. No bruits or masses. Good bowel sounds. Extremities: no cyanosis, clubbing, rash, edema. RUE PICC Neuro: alert & orientedx3, cranial nerves grossly intact. moves all 4 extremities w/o difficulty. Affect pleasant    Telemetry   NSR 90-100s with occasional PVCs    EKG    No new EKG to review   Labs    CBC No results for input(s): WBC, NEUTROABS, HGB, HCT, MCV, PLT in the last 72 hours. Basic Metabolic Panel Recent Labs    05/17/21 0438 05/18/21 0500 05/19/21 0535  NA 130* 131* 134*  K 4.3 3.8 4.4  CL 88* 90* 93*  CO2 34* 32 31  GLUCOSE 141* 126* 115*  BUN 38* 43* 48*  CREATININE 1.90* 1.70* 1.94*  CALCIUM 9.2 8.8* 9.4  MG 1.9  --    --    Liver Function Tests No results for input(s): AST, ALT, ALKPHOS, BILITOT, PROT, ALBUMIN in the last 72 hours. No results for input(s): LIPASE, AMYLASE in the last 72 hours. Cardiac Enzymes No results for input(s): CKTOTAL, CKMB, CKMBINDEX, TROPONINI in the last 72 hours.  BNP: BNP (last 3 results) Recent Labs    10/31/20 1604 11/12/20 1054 05/13/21 1611  BNP 1,322.2* 970.2* 1,976.6*    ProBNP (last 3 results) No results for input(s): PROBNP in the last 8760 hours.   D-Dimer No results for input(s): DDIMER in the last 72 hours. Hemoglobin A1C No results for input(s): HGBA1C in the last 72 hours. Fasting Lipid Panel No results for input(s): CHOL, HDL, LDLCALC, TRIG, CHOLHDL, LDLDIRECT in the last 72 hours. Thyroid Function Tests No results for input(s): TSH, T4TOTAL, T3FREE, THYROIDAB in the last 72 hours.  Invalid input(s): FREET3  Other results:   Imaging    No results found.   Medications:     Scheduled Medications: . aspirin EC  81 mg Oral Daily  . atorvastatin  80 mg Oral Daily  . Chlorhexidine Gluconate Cloth  6 each Topical Daily  . colchicine  0.6 mg Oral Daily  . digoxin  0.125 mg Oral Daily  . enoxaparin (LOVENOX) injection  40 mg Subcutaneous Q24H  .  ivabradine  5 mg Oral BID WC  . nicotine  7 mg Transdermal Daily  . polyethylene glycol  17 g Oral BID  . potassium chloride  40 mEq Oral BID  . predniSONE  40 mg Oral Q breakfast  . senna-docusate  1 tablet Oral BID  . sodium chloride flush  10-40 mL Intracatheter Q12H  . sodium chloride flush  3 mL Intravenous Q12H  . spironolactone  12.5 mg Oral Daily  . torsemide  80 mg Oral BID    Infusions: . sodium chloride      PRN Medications: sodium chloride, ipratropium-albuterol, methocarbamol, sodium chloride flush, sodium chloride flush   Assessment/Plan   1. Acute on chronic systolic OA:[id number] MI in 2015, DES to LAD.Echo in 2015 with EF 40-45%. Cath in 10/19 showed 90% ostial  OM1 and 70% PLV, no interventional target. 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 LastEcho 10/21 showed EF 20-25% with mild RV dysfunction.He has a strong family history of CHF/cardiomyopathy, so may be a component of familial cardiomyopathy.Cocaine likely plays a role in his cardiomyopathy.Multiple re-admits forcardiogenic shock requiring milrinone. Unfortunately, not a candidate for advanced therapies or home milrinone with active use of cocaine, homelessness, and gastric cancer. He has end-stage HF. Now readmitted w/ volume overloaded.  He has been sporadic with his meds and is cocaine+.  Creatinine up to 2.09 initially, well above baseline. Initial poor response to IV Lasix raising concern for low output => co-ox low on PICC. Started on milrinone 0.25 then weaned off.  CO-OX stable 59% today.  - CVP 4 today. Cut back torsemide to 80 mg daily. I dont think he will take torsemide twice a day.  - Given social situation, gastric cancer,and ongoing substance abuse,he is not a candidate for any advanced therapies including home milrinone - No bb with low output.  - Can continue home ivabradine 5 mg tid.  - Continue jardiance 10 mg daily.  - Continue spironolactone 12.5 mg daily. No BP room to increase. - No BP room for Entresto or Bidil.  - Continue digoxin 0.125 daily.  2. CAD: H/o anterior STEMI with DES to LAD in 7/15. Cath in 10/19 with 90% ostial OM1 and 70% PLV, no interventional target. - No chest pain.  HS-TnI mildly elevated with no trend, suspect demand ischemia with volume overload and low output. -ContinueASA 81 and atorvastatin 80 mg daily.  - needs to refrain from using cocaine 3. Cocaine abuse:Has been +cocaine each hospital visit. Encouraged abstinence.  4. Active smoker:Urged cessation.  5. AKI on CKD Stage 3: Creatinine improved on milrinone. Cardiorenal syndrome, continue milrinone while inpatient.  - Creatinine 1.9>1.7  6. Mitral  Regurgitation: Moderate to severe on 10/21 echo, suspect functional MR from dilated CMP. -Not great Mitraclip candidate with cocaine abuse and compliance issues.  7. Gastric Cancer: Intramucosal adenocarcinoma.Has had FOLFOX chemo.  Was supposed to get radiation but has not been compliant with followup. Will need followup with heme/onc.   8. Elevated LFTs: Suspect congestive hepatopathy, improved.  9. Hyponatremia: 134  today. Hypervolemic hyponatremia.   - Fluid restrict.  10. Left ankle pain: Possible gout.  - Uric 11.4  - Can get colchicine 0.6 mg daily.  - Would give him a prednisone burst, 40 mg today with taper over 5 days.   SW following to assist with disposition.   As noted above, not a candidate for home milrinone or advanced therapies.   Length of Stay: [place], NP  05/19/2021, 10:03 AM  Advanced Heart Failure Team  Pager [phone] (M-F; [facility])  Please contact [facility] for night-coverage after hours (5p -7a ) and weekends on [email or web address]  Agree with the above NP note.  Working on getting disposition worked out.  Still does not have a place to go.   CVP 4 today with co-ox 59%, creatinine mildly higher at 1.9.  Reasonable to cut back torsemide to 80 mg once a day.  Continue current cardiac meds, no BP room for titration.    Not candidate for advanced therapies or home milrinone with active cocaine abuse.   Loralie Champagne 05/19/2021

## 2021-05-20 ENCOUNTER — Inpatient Hospital Stay (HOSPITAL_COMMUNITY): Payer: Medicaid Other

## 2021-05-20 ENCOUNTER — Telehealth (HOSPITAL_COMMUNITY): Payer: Self-pay | Admitting: Licensed Clinical Social Worker

## 2021-05-20 ENCOUNTER — Ambulatory Visit: Payer: Medicaid Other

## 2021-05-20 DIAGNOSIS — I5043 Acute on chronic combined systolic (congestive) and diastolic (congestive) heart failure: Secondary | ICD-10-CM | POA: Diagnosis not present

## 2021-05-20 LAB — CBC WITH DIFFERENTIAL/PLATELET
Abs Immature Granulocytes: 0.05 10*3/uL (ref 0.00–0.07)
Basophils Absolute: 0 10*3/uL (ref 0.0–0.1)
Basophils Relative: 0 %
Eosinophils Absolute: 0 10*3/uL (ref 0.0–0.5)
Eosinophils Relative: 0 %
HCT: 37.6 % — ABNORMAL LOW (ref 39.0–52.0)
Hemoglobin: 12.5 g/dL — ABNORMAL LOW (ref 13.0–17.0)
Immature Granulocytes: 1 %
Lymphocytes Relative: 4 %
Lymphs Abs: 0.3 10*3/uL — ABNORMAL LOW (ref 0.7–4.0)
MCH: 28.3 pg (ref 26.0–34.0)
MCHC: 33.2 g/dL (ref 30.0–36.0)
MCV: 85.1 fL (ref 80.0–100.0)
Monocytes Absolute: 0.4 10*3/uL (ref 0.1–1.0)
Monocytes Relative: 4 %
Neutro Abs: 8 10*3/uL — ABNORMAL HIGH (ref 1.7–7.7)
Neutrophils Relative %: 91 %
Platelets: 247 10*3/uL (ref 150–400)
RBC: 4.42 MIL/uL (ref 4.22–5.81)
RDW: 15.3 % (ref 11.5–15.5)
WBC: 8.8 10*3/uL (ref 4.0–10.5)
nRBC: 0 % (ref 0.0–0.2)

## 2021-05-20 LAB — GLUCOSE, CAPILLARY: Glucose-Capillary: 187 mg/dL — ABNORMAL HIGH (ref 70–99)

## 2021-05-20 LAB — COMPREHENSIVE METABOLIC PANEL
ALT: 60 U/L — ABNORMAL HIGH (ref 0–44)
AST: 22 U/L (ref 15–41)
Albumin: 3 g/dL — ABNORMAL LOW (ref 3.5–5.0)
Alkaline Phosphatase: 131 U/L — ABNORMAL HIGH (ref 38–126)
Anion gap: 9 (ref 5–15)
BUN: 67 mg/dL — ABNORMAL HIGH (ref 6–20)
CO2: 25 mmol/L (ref 22–32)
Calcium: 8.9 mg/dL (ref 8.9–10.3)
Chloride: 90 mmol/L — ABNORMAL LOW (ref 98–111)
Creatinine, Ser: 2.23 mg/dL — ABNORMAL HIGH (ref 0.61–1.24)
GFR, Estimated: 34 mL/min — ABNORMAL LOW (ref >60)
Glucose, Bld: 237 mg/dL — ABNORMAL HIGH (ref 70–99)
Potassium: 4.9 mmol/L (ref 3.5–5.1)
Sodium: 124 mmol/L — ABNORMAL LOW (ref 135–145)
Total Bilirubin: 1 mg/dL (ref 0.3–1.2)
Total Protein: 6.8 g/dL (ref 6.5–8.1)

## 2021-05-20 LAB — LACTIC ACID, PLASMA: Lactic Acid, Venous: 1.8 mmol/L (ref 0.5–1.9)

## 2021-05-20 LAB — COOXEMETRY PANEL
Carboxyhemoglobin: 0.9 % (ref 0.5–1.5)
Methemoglobin: 1.3 % (ref 0.0–1.5)
O2 Saturation: 62.8 %
Total hemoglobin: 12.8 g/dL (ref 12.0–16.0)

## 2021-05-20 LAB — TROPONIN I (HIGH SENSITIVITY)
Troponin I (High Sensitivity): 193 ng/L (ref <18)
Troponin I (High Sensitivity): 191 ng/L (ref <18)

## 2021-05-20 LAB — OSMOLALITY: Osmolality: 295 mOsm/kg (ref 275–295)

## 2021-05-20 LAB — BASIC METABOLIC PANEL
Anion gap: 9 (ref 5–15)
BUN: 65 mg/dL — ABNORMAL HIGH (ref 6–20)
CO2: 29 mmol/L (ref 22–32)
Calcium: 9 mg/dL (ref 8.9–10.3)
Chloride: 92 mmol/L — ABNORMAL LOW (ref 98–111)
Creatinine, Ser: 2.71 mg/dL — ABNORMAL HIGH (ref 0.61–1.24)
GFR, Estimated: 27 mL/min — ABNORMAL LOW (ref >60)
Glucose, Bld: 147 mg/dL — ABNORMAL HIGH (ref 70–99)
Potassium: 4.1 mmol/L (ref 3.5–5.1)
Sodium: 130 mmol/L — ABNORMAL LOW (ref 135–145)

## 2021-05-20 LAB — MAGNESIUM: Magnesium: 1.8 mg/dL (ref 1.7–2.4)

## 2021-05-20 LAB — DIGOXIN LEVEL: Digoxin Level: 0.6 ng/mL — ABNORMAL LOW (ref 0.8–2.0)

## 2021-05-20 LAB — LIPASE, BLOOD: Lipase: 61 U/L — ABNORMAL HIGH (ref 11–51)

## 2021-05-20 LAB — CK: Total CK: 43 U/L — ABNORMAL LOW (ref 49–397)

## 2021-05-20 MED ORDER — OXYCODONE HCL 5 MG PO TABS: 5.0000 mg | ORAL_TABLET | Freq: Four times a day (QID) | ORAL | Status: DC | PRN

## 2021-05-20 MED ORDER — TORSEMIDE 20 MG PO TABS: 80.0000 mg | ORAL_TABLET | Freq: Every day | ORAL | Status: DC

## 2021-05-20 MED ORDER — LACTULOSE 10 GM/15ML PO SOLN
30.0000 g | Freq: Two times a day (BID) | ORAL | Status: DC
  Administered 2021-05-20 – 2021-05-21 (×2): 30 g via ORAL
  Filled 2021-05-20 (×2): qty 45

## 2021-05-20 MED ORDER — POLYETHYLENE GLYCOL 3350 17 G PO PACK
17.0000 g | PACK | Freq: Two times a day (BID) | ORAL | Status: DC
  Administered 2021-05-20 – 2021-05-21 (×2): 17 g via ORAL
  Filled 2021-05-20 (×2): qty 1

## 2021-05-20 MED ORDER — GABAPENTIN 100 MG PO CAPS
100.0000 mg | ORAL_CAPSULE | Freq: Two times a day (BID) | ORAL | Status: DC
  Administered 2021-05-20 – 2021-05-21 (×3): 100 mg via ORAL
  Filled 2021-05-20 (×3): qty 1

## 2021-05-20 NOTE — Telephone Encounter (Signed)
Outpatient HF CSW received call from pt [employer] member services worker who states that they have approved patient for 7 nights in a hotel- case worker emailed [place] hotel confirmation details.  CSW will provide confirmation sheet to patient and has updated inpatient [facility] team that patient will have somewhere to DC today if medically stable.  CSW will follow pt in outpatient setting and continue to assist with getting into shelter  [name], [facility] Desk#: [phone] Cell#: [phone]

## 2021-05-20 NOTE — Progress Notes (Addendum)
Patient ID: Tyler Wong, male   DOB: October 25, 1966, 55 y.o.   MRN: [id number]     Advanced Heart Failure Rounding Note  PCP-Cardiologist: Shelva Majestic, MD   Subjective:    Co-ox stable at 63% off milrinone, however SCr continues to rise, 1.70>>1.94>>2.71.  3.8L in [facility] yesterday on PO torsemide. CVP 7-8   Na 130 K 4.1  SBPs 90s   Feeling better. No dyspnea.   Working on getting disposition worked out.  Still does not have a place to go. SW following.   Objective:   Weight Range: 68.9 kg Body mass index is 21.79 kg/m.   Vital Signs:   Temp:  [97.6 F (36.4 C)-98.4 F (36.9 C)] 98.4 F (36.9 C) (05/24 0450) Pulse Rate:  [76-110] 98 (05/24 0450) Resp:  [16-20] 18 (05/24 0721) BP: (81-97)/(62-76) 92/72 (05/24 0721) SpO2:  [96 %-100 %] 99 % (05/24 0450) Weight:  [68.9 kg] 68.9 kg (05/24 0450) Last BM Date: 05/20/21  Weight change: Filed Weights   05/18/21 0422 05/19/21 0409 05/20/21 0450  Weight: 69.4 kg 67.3 kg 68.9 kg    Intake/Output:   Intake/Output Summary (Last 24 hours) at 05/20/2021 0809 Last data filed at 05/20/2021 0515 Gross per 24 hour  Intake 1314 ml  Output 3750 ml  Net -2436 ml      Physical Exam  CVP 7-8 General: Thin middle aged AAM. No respiratory difficulty HEENT: normal Neck: supple. JVD 7-8 cm. Carotids 2+ bilat; no bruits. No lymphadenopathy or thyromegaly appreciated. Cor: PMI nondisplaced. Regular rate & rhythm. 3/6 MR murmur loudest at apex  Lungs: clear Abdomen: soft, nontender, nondistended. No hepatosplenomegaly. No bruits or masses. Good bowel sounds. Extremities: no cyanosis, clubbing, rash, edema + RUE PICC  Neuro: alert & oriented x 3, cranial nerves grossly intact. moves all 4 extremities w/o difficulty. Affect pleasant.   Telemetry   NSR 80s-90s with occasional PVCs    EKG    No new EKG to review   Labs    CBC No results for input(s): WBC, NEUTROABS, HGB, HCT, MCV, PLT in the last 72 hours. Basic Metabolic  Panel Recent Labs    05/19/21 0535 05/20/21 0518  NA 134* 130*  K 4.4 4.1  CL 93* 92*  CO2 31 29  GLUCOSE 115* 147*  BUN 48* 65*  CREATININE 1.94* 2.71*  CALCIUM 9.4 9.0   Liver Function Tests No results for input(s): AST, ALT, ALKPHOS, BILITOT, PROT, ALBUMIN in the last 72 hours. No results for input(s): LIPASE, AMYLASE in the last 72 hours. Cardiac Enzymes No results for input(s): CKTOTAL, CKMB, CKMBINDEX, TROPONINI in the last 72 hours.  BNP: BNP (last 3 results) Recent Labs    10/31/20 1604 11/12/20 1054 05/13/21 1611  BNP 1,322.2* 970.2* 1,976.6*    ProBNP (last 3 results) No results for input(s): PROBNP in the last 8760 hours.   D-Dimer No results for input(s): DDIMER in the last 72 hours. Hemoglobin A1C No results for input(s): HGBA1C in the last 72 hours. Fasting Lipid Panel No results for input(s): CHOL, HDL, LDLCALC, TRIG, CHOLHDL, LDLDIRECT in the last 72 hours. Thyroid Function Tests No results for input(s): TSH, T4TOTAL, T3FREE, THYROIDAB in the last 72 hours.  Invalid input(s): FREET3  Other results:   Imaging    No results found.   Medications:     Scheduled Medications:  aspirin EC  81 mg Oral Daily   atorvastatin  80 mg Oral Daily   Chlorhexidine Gluconate Cloth  6 each Topical Daily  colchicine  0.6 mg Oral Daily   digoxin  0.125 mg Oral Daily   enoxaparin (LOVENOX) injection  40 mg Subcutaneous Q24H   ivabradine  5 mg Oral BID WC   nicotine  7 mg Transdermal Daily   polyethylene glycol  17 g Oral BID   potassium chloride  40 mEq Oral BID   predniSONE  40 mg Oral Q breakfast   senna-docusate  1 tablet Oral BID   sodium chloride flush  10-40 mL Intracatheter Q12H   sodium chloride flush  3 mL Intravenous Q12H   spironolactone  12.5 mg Oral Daily   torsemide  80 mg Oral Daily    Infusions:  sodium chloride      PRN Medications: sodium chloride, ipratropium-albuterol, methocarbamol, sodium chloride flush, sodium chloride  flush   Assessment/Plan   1. Acute on chronic systolic HF:  Anterior MI in 2015, DES to LAD.  Echo in 2015 with EF 40-45%. Cath in 10/19 showed 90% ostial OM1 and 70% PLV, no interventional target.  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 Last Echo 10/21 showed EF 20-25% with mild RV dysfunction.  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  Cocaine likely plays a role in his cardiomyopathy. Multiple re-admits for cardiogenic shock requiring milrinone. Unfortunately, not a candidate for advanced therapies or home milrinone with active use of cocaine, homelessness, and gastric cancer.  He has end-stage HF.  Now readmitted w/ volume overloaded.   He has been sporadic with his meds and is cocaine+.  Creatinine up to 2.09 initially, well above baseline. Initial poor response to IV Lasix raising concern for low output => co-ox low on PICC. Started on milrinone 0.25 then weaned off.  CO-OX stable 63% today. CVP 7-8  - Continue torsemide 80 mg daily. I dont think he will take torsemide twice a day.  - Given social situation, gastric cancer, and ongoing substance abuse,  he is not a candidate for any advanced therapies including home milrinone - No bb with low output.  - Can continue home ivabradine 5 mg tid.   - Stop Jardiance, spiro and dig w/ AKI  - No BP room for Entresto or Bidil.  2. CAD: H/o anterior STEMI with DES to LAD in 7/15. Cath in 10/19 with 90% ostial OM1 and 70% PLV, no interventional target.  - No chest pain.  HS-TnI mildly elevated with no trend, suspect demand ischemia with volume overload and low output.  - Continue ASA 81 and atorvastatin 80 mg daily.  - needs to refrain from using cocaine 3. Cocaine abuse: Has been +cocaine each hospital visit.  Encouraged abstinence.  4. Active smoker: Urged cessation.  5. AKI on CKD Stage 3:  Creatinine improved on milrinone. Cardiorenal syndrome, continue milrinone  while inpatient.  - Creatinine 1.9>1.7>1.9>2.7 6. Mitral Regurgitation: Moderate to severe on 10/21 echo, suspect functional MR from dilated CMP.  - Not great Mitraclip candidate with cocaine abuse and compliance issues.  7. Gastric Cancer: Intramucosal adenocarcinoma. Has had FOLFOX chemo.  Was supposed to get radiation but has not been compliant with followup. Will need followup with heme/onc.   8. Elevated LFTs: Suspect congestive hepatopathy, improved.  9. Hyponatremia: 130  today. Hypervolemic hyponatremia.   - Fluid restrict.  10. Left ankle pain: Possible gout.  - Uric 11.4  - Can get colchicine 0.6 mg daily.  - Would give him a prednisone burst, 40 mg today with taper over 5 days.  SW following to assist with disposition.   As noted above, not a candidate for home milrinone or advanced therapies.    Length of Stay: [address], PA-C  05/20/2021, 8:09 AM  Advanced Heart Failure Team Pager [phone] (M-F; 7a - 5p)  Please contact [facility] for night-coverage after hours (5p -7a ) and weekends on [email or web address]  Agree with the above note.   Creatinine rising off milrinone. CVP 7-8.    Agree with holding digoxin, spironolactone, and Jardiance with rising creatinine.   Hold torsemide and recheck CVP tomorrow.   I am concerned that he is going to do poorly.  No candidate for home milrinone or advanced therapies and has not been willing to talk to palliative care in the past.   Tyler Wong 05/20/2021

## 2021-05-20 NOTE — TOC Progression Note (Addendum)
Transition of Care (TOC) - Progression Note  Heart Failure   Patient Details  Name: Delrico Minehart MRN: [id number] Date of Birth: 04-03-1966  Transition of Care [facility]) CM/SW [place], [place] Phone Number: 05/20/2021, 12:22 PM  Clinical Narrative:    CSW briefly spoke with the patient at bedside and discussed his discharge plan to stay at the hotel, [facility] and once the discharge orders are in [place] can arrange for transportation to the hotel. Check in is at 3pm. Patient no longer discharging today due to rising creatinine. Awaiting medical stability.  CSW will continue to follow throughout discharge.      Barriers to Discharge: Continued Medical Work up,Homeless with medical needs  Expected Discharge Plan and Services   In-house Referral: Clinical Social Work     Living arrangements for the past 2 months: No permanent address,Homeless                                       Social Determinants of Health (SDOH) Interventions Food Insecurity Interventions: Other (Comment) (patient reports he does get food stamps) Financial Strain Interventions: Other (Comment) (patient reported he has tried applying for disability but missed an appointment and is unsure where that stands currently) Housing Interventions: Other (Comment) (outpatient HF CSW working on shelter/housing resources) Transportation Interventions: [facility]  Readmission Risk Interventions Readmission Risk Prevention Plan 11/04/2020 10/09/2020  Transportation Screening Complete Complete  Medication Review Press photographer) Complete Complete  PCP or Specialist appointment within 3-5 days of discharge Complete -  [place] or [place] Complete Complete  SW Recovery Care/Counseling Consult Complete Complete  Palliative Care Screening Not Applicable Not [place] Not Applicable Not Applicable  Some recent data might be hidden   Leani Myron, MSW,  LCSWA [phone] Heart Failure Social Worker

## 2021-05-20 NOTE — Progress Notes (Signed)
Pt began to complain of upper abdominal pain, EKG obtained, MD Patel notified. Pt denies chest pain and remains resting in bed, tachycardic otherwise VSS stable. PA Brittney also notified. Labs obtained per orders including trops, lipase, lactic acid, and CK. No signs of distress. Will continue to monitor for further changes.

## 2021-05-20 NOTE — Progress Notes (Signed)
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  Currently plan is monitor renal function and further work-up for abdominal pain.  Assessment and Plan: Acute exacerbation of chronic combined systolic and diastolic CHF end-stage heart failure Moderate to severe mitral regurgitation Mixed ischemic/nonischemic (cocaine) cardiomyopathy. Followed by heart failure team and not a candidate for advanced therapies or home milrinone secondary to ongoing cocaine abuse and underlying gastric cancer.  Appreciate palliative consultation. Patient has a PICC line and started on Primacor. Also on IV Lasix 80 mg twice daily along with Zaroxolyn  Along with that on ivabradine 5 mg 3 times daily, Aldactone 12.5 mg and digoxin 0.125 mg. No beta-blocker due to ongoing cocaine use. Low-sodium diet with fluid restriction.  No repeat echo per cardiology.  Currently Primacor and IV Lasix is stopped. On torsemide 80 mg twice daily for home. Due to worsening renal function torsemide is reduced to 80 mg daily.  Now on hold. Also other medications on hold as well due to worsening renal function. Continue current regimen.  Chest pain noncardiac. CAD Cocaine use Likely due to decompensated advanced heart failure and ongoing cocaine use.  No plan for invasive therapy.  EKG showing ST/T wave changes; no STEMI. High-sensitivity troponin slightly elevated but stable. Continue aspirin and Lipitor.  Abdominal pain. Patient reports diarrhea with abdominal  pain. X-ray abdomen shows evidence of stool burden. We will continue with bowel regimen. Initiate further work-up as the patient's pain, the pain is progressively worsening today.  AKI on CKD stage IIIa Likely cardiorenal from decompensated heart failure.  BUN 50, creatinine 2.1 (baseline 1.2). Renal function initially improved with diuresis.  Worsened again, Likely overdiuresis. Avoid any other nephrotoxic agents.  Elevated LFTs Likely due to hepatic congestion from decompensated heart failure. Continue to monitor  Mild hyponatremia Likely due to volume overload. Continue IV diuresis and monitor sodium level closely.  COPD Stable.  No signs of acute exacerbation. DuoNeb as needed  Hyperlipidemia Continue Lipitor.  Cocaine abuse He has been positive for cocaine during prior hospital visits as well. Counseled to quit  Tobacco use NicoDerm patch and counseled to quit  Gastric cancer On chemotherapy and followed by Dr. Benay Spice  Goals of care conversation. Ideally will benefit from being on hospice.  Currently full code. Sister is healthcare power of attorney.  Bilateral leg cramp. Left ankle gout Left ankle swelling and warmth. Patient reports bilateral leg current we will add Robaxin. Patient also has bilateral foot pain with left ankle swollen more than the right 1. X-ray negative for any fracture. Started on prednisone.  Monitor.  Social situation. Appreciate assistance from all the social worker as well as case management.  Currently a hotel accommodation is available to the patient pending medical stability.   Diet: Cardiac diet DVT Prophylaxis:   Place TED hose Start: 05/14/21 1016 enoxaparin (LOVENOX) injection 40 mg Start: 05/13/21 2245   Advance goals of care discussion: Full code  Family Communication: no family was present at bedside, at the time of interview.  Discussed with sister on the  phone.  Disposition:  Status is:  Inpatient  Remains inpatient appropriate because: Renal function worsening.  Monitor for stability.  Dispo: The patient is from: Home              Anticipated d/c is to: Home              Patient currently is not medically stable to d/c.   Difficult to place patient No  Subjective: Reports abdominal pain.  Reports diarrhea 2 episode without any blood.  Continues to report need to go to the bathroom.  No nausea no vomiting.  Minimal oral intake today.  Physical Exam:  General: Appear in mild distress, no Rash; Oral Mucosa Clear, moist. no Abnormal Neck Mass Or lumps, Conjunctiva normal  Cardiovascular: S1 and S2 Present, no Murmur, Respiratory: good respiratory effort, Bilateral Air entry present and CTA, no Crackles, no wheezes Abdomen: Bowel Sound present, Soft and mild tenderness, no distention Extremities: no Pedal edema Neurology: alert and oriented to time, place, and person affect appropriate. no new focal deficit Gait not checked due to patient safety concerns  Vitals:   05/20/21 0017 05/20/21 0450 05/20/21 0721 05/20/21 1045  BP: (!) 81/68 93/66 92/72  94/69  Pulse: 76 98    Resp: 18 18 18 18   Temp: 98.1 F (36.7 C) 98.4 F (36.9 C) 98.1 F (36.7 C) 98 F (36.7 C)  TempSrc: Oral Oral Oral Oral  SpO2: 98% 99% 99% 98%  Weight:  68.9 kg    Height:        Intake/Output Summary (Last 24 hours) at 05/20/2021 1926 Last data filed at 05/20/2021 1275 Gross per 24 hour  Intake 820 ml  Output 1550 ml  Net -730 ml   Filed Weights   05/18/21 0422 05/19/21 0409 05/20/21 0450  Weight: 69.4 kg 67.3 kg 68.9 kg    Data Reviewed: I have personally reviewed and interpreted daily labs, tele strips, imaging. I reviewed all nursing notes, pharmacy notes, vitals, pertinent old records I have discussed plan of care as described above with RN and patient/family.  CBC: No results for input(s): WBC, NEUTROABS, HGB, HCT, MCV, PLT in the last 168 hours. Basic Metabolic Panel: Recent  Labs  Lab 05/15/21 0521 05/16/21 0500 05/17/21 0438 05/18/21 0500 05/19/21 0535 05/20/21 0518  NA 132* 129* 130* 131* 134* 130*  K 3.2* 3.8 4.3 3.8 4.4 4.1  CL 90* 89* 88* 90* 93* 92*  CO2 34* 33* 34* 32 31 29  GLUCOSE 182* 191* 141* 126* 115* 147*  BUN 40* 37* 38* 43* 48* 65*  CREATININE 2.02* 1.87* 1.90* 1.70* 1.94* 2.71*  CALCIUM 8.8* 8.8* 9.2 8.8* 9.4 9.0  MG 1.9 1.9 1.9  --   --   --     Studies: No results found.  Scheduled Meds: . aspirin EC  81 mg Oral Daily  . atorvastatin  80 mg Oral Daily  . Chlorhexidine Gluconate Cloth  6 each Topical Daily  . colchicine  0.6 mg Oral Daily  . enoxaparin (LOVENOX) injection  40 mg Subcutaneous Q24H  . gabapentin  100 mg Oral BID  . ivabradine  5 mg Oral BID WC  . lactulose  30 g Oral BID  . nicotine  7 mg Transdermal Daily  . polyethylene glycol  17 g Oral BID  . predniSONE  40 mg Oral Q breakfast  . sodium chloride flush  10-40 mL Intracatheter Q12H  . sodium chloride flush  3 mL Intravenous Q12H   Continuous Infusions: .  sodium chloride     PRN Meds: sodium chloride, ipratropium-albuterol, methocarbamol, oxyCODONE, sodium chloride flush, sodium chloride flush  Time spent: 35 minutes  Author: Berle Mull, MD [facility] 05/20/2021 7:26 PM  To reach On-call, see care teams to locate the attending and reach out via [email or web address]. Between 7PM-7AM, please contact night-coverage If you still have difficulty reaching the attending provider, please page the [facility] (Director on Call) for [facility] on amion for assistance.

## 2021-05-20 NOTE — Progress Notes (Signed)
Sister Peter Congo at bedside; wanting medical update. Attending paged.

## 2021-05-21 ENCOUNTER — Other Ambulatory Visit (HOSPITAL_COMMUNITY): Payer: Self-pay

## 2021-05-21 ENCOUNTER — Telehealth (HOSPITAL_COMMUNITY): Payer: Self-pay | Admitting: Licensed Clinical Social Worker

## 2021-05-21 ENCOUNTER — Ambulatory Visit: Payer: Medicaid Other

## 2021-05-21 LAB — BASIC METABOLIC PANEL
Anion gap: 7 (ref 5–15)
BUN: 53 mg/dL — ABNORMAL HIGH (ref 6–20)
CO2: 27 mmol/L (ref 22–32)
Calcium: 8.9 mg/dL (ref 8.9–10.3)
Chloride: 96 mmol/L — ABNORMAL LOW (ref 98–111)
Creatinine, Ser: 1.66 mg/dL — ABNORMAL HIGH (ref 0.61–1.24)
GFR, Estimated: 48 mL/min — ABNORMAL LOW (ref >60)
Glucose, Bld: 164 mg/dL — ABNORMAL HIGH (ref 70–99)
Potassium: 3.9 mmol/L (ref 3.5–5.1)
Sodium: 130 mmol/L — ABNORMAL LOW (ref 135–145)

## 2021-05-21 LAB — CBC
HCT: 37.4 % — ABNORMAL LOW (ref 39.0–52.0)
Hemoglobin: 12.4 g/dL — ABNORMAL LOW (ref 13.0–17.0)
MCH: 28.1 pg (ref 26.0–34.0)
MCHC: 33.2 g/dL (ref 30.0–36.0)
MCV: 84.8 fL (ref 80.0–100.0)
Platelets: 250 10*3/uL (ref 150–400)
RBC: 4.41 MIL/uL (ref 4.22–5.81)
RDW: 15.2 % (ref 11.5–15.5)
WBC: 9.8 10*3/uL (ref 4.0–10.5)
nRBC: 0 % (ref 0.0–0.2)

## 2021-05-21 LAB — COOXEMETRY PANEL
Carboxyhemoglobin: 0.9 % (ref 0.5–1.5)
Methemoglobin: 1.2 % (ref 0.0–1.5)
O2 Saturation: 50.1 %
Total hemoglobin: 12.5 g/dL (ref 12.0–16.0)

## 2021-05-21 LAB — MAGNESIUM: Magnesium: 2 mg/dL (ref 1.7–2.4)

## 2021-05-21 MED ORDER — NICOTINE 7 MG/24HR TD PT24: 7.0000 mg | MEDICATED_PATCH | Freq: Every day | TRANSDERMAL | 0 refills | Status: AC

## 2021-05-21 MED ORDER — COLCHICINE 0.6 MG PO TABS: 0.6000 mg | ORAL_TABLET | Freq: Every day | ORAL | 0 refills | Status: DC

## 2021-05-21 MED ORDER — TORSEMIDE 20 MG PO TABS
80.0000 mg | ORAL_TABLET | Freq: Every day | ORAL | Status: DC
  Administered 2021-05-21: 80 mg via ORAL
  Filled 2021-05-21: qty 4

## 2021-05-21 MED ORDER — GABAPENTIN 100 MG PO CAPS: 100.0000 mg | ORAL_CAPSULE | Freq: Two times a day (BID) | ORAL | 0 refills | Status: AC

## 2021-05-21 MED ORDER — POTASSIUM CHLORIDE CRYS ER 20 MEQ PO TBCR
40.0000 meq | EXTENDED_RELEASE_TABLET | Freq: Once | ORAL | Status: AC
  Administered 2021-05-21: 40 meq via ORAL
  Filled 2021-05-21: qty 2

## 2021-05-21 MED ORDER — POLYETHYLENE GLYCOL 3350 17 G PO PACK: 17.0000 g | PACK | Freq: Two times a day (BID) | ORAL | 0 refills | Status: DC

## 2021-05-21 MED ORDER — EMPAGLIFLOZIN 10 MG PO TABS: 10.0000 mg | ORAL_TABLET | Freq: Every day | ORAL | Status: DC

## 2021-05-21 MED ORDER — TORSEMIDE 40 MG PO TABS: 80.0000 mg | ORAL_TABLET | Freq: Every day | ORAL | 1 refills | Status: DC

## 2021-05-21 MED ORDER — PREDNISONE 20 MG PO TABS: 40.0000 mg | ORAL_TABLET | Freq: Every day | ORAL | 0 refills | Status: DC

## 2021-05-21 NOTE — Plan of Care (Signed)

## 2021-05-21 NOTE — Discharge Summary (Signed)
Physician Discharge Summary  Tyler Wong HBZ:[id number] DOB: [birth year] DOA: 05/13/2021  PCP: [name], NP  Admit date: 05/13/2021 Discharge date: 05/21/2021  Time spent: 27 minutes  Recommendations for Outpatient Follow-up:  1. Needs Chem-12 CBC magnesium 1 week and follow-up clinic 2. Recommend outpatient cardiology follow-up 3. Xeloda held until can be seen in oncology office-have CCed Dr. Michail Sermon and Dr. Benay Spice with regards to outpatient follow-up 4. All scripts called into his pharmacy at [facility] including his Protonix for abdominal discomfort/ulcer 5. High risk for readmission  Discharge Diagnoses:  MAIN problem for hospitalization   Acute decompensated systolic heart failure EF less than 20% not a candidate for home inotropes 2/2 polysubstance abuse  Please see below for itemized issues addressed in HOpsital- refer to other progress notes for clarity if needed  Discharge Condition: Guarded  Diet recommendation: Heart healthy low-salt low volume  Filed Weights   05/19/21 0409 05/20/21 0450 05/21/21 0451  Weight: 67.3 kg 68.9 kg 69.2 kg    History of present illness:  36 community dwelling black male CAD + PCI + HFrEF EF <20% RV dysfunction Chronic cocaine habituation Stage IIIa gastric CA followed by Dr. Malachy Mood status post FOLFOX 6 cycles/XRT Dr. Lisbeth Renshaw last seen 05/15/2021-had a complete response to FOLFOX per CT scan  Admitted with SOB, CP-advanced heart failure team/palliative care/oncology consulted Management guided by heart failure team    Hospital Course:  Patient seen by heart failure team had a PICC line placed diuresed with IV Lasix and Zaroxolyn as per them Creatinine bumped secondary to diuresis as well as cardiorenal syndrome diuretics adjusted and discontinued Entresto etc. etc. Patient poor candidate for beta-blocker given continued cocaine use with no plans for invasive evaluation of ischemia His elevated LFTs were probably  secondary to congestive symptoms of heart failure Patient was counseled extensively during hospital stay to quit cocaine methamphetamine Ideally because of his multiple morbid illness and serious cardiac cachexia he may benefit from being considered for hospice He will be discharging to hotel-social worker has been made aware-he was given a burst of steroids on discharge for presumed gout and colchicine  He is extremely high risk for readmission  It looks like Dr. Benay Spice and oncology team are trying to help him reestablish care for his gastric cancer and I have told him he needs to follow-up in the outpatient setting prior to using Xeloda-I will CC his oncologist   Discharge Exam: Vitals:   05/21/21 0451 05/21/21 0758  BP: 94/81 94/69  Pulse: 93 (!) 106  Resp: 18   Temp: 98.1 F (36.7 C) 97.6 F (36.4 C)  SpO2: 99% 98%    Subj on day of d/c   Doing fair ambulating around the room no rebound no guarding  General Exam on discharge  Awake frail cachectic black male in no distress Not dizzy ambulating around the room however vital signs showed MAP in the 80s S1-S2 ?  Sinus tach on monitors Slight murmur Abdomen soft no rebound no guarding Ankles not swollen neurologically intact no focal deficit    Discharge Instructions    Allergies as of 05/21/2021   No Known Allergies     Medication List    STOP taking these medications   BiDil 20-37.5 MG tablet Generic drug: isosorbide-hydrALAZINE   capecitabine 500 MG tablet Commonly known as: XELODA   digoxin 0.125 MG tablet Commonly known as: LANOXIN   Entresto 24-26 MG Generic drug: sacubitril-valsartan   omeprazole 20 MG capsule Commonly known as: PRILOSEC  spironolactone 25 MG tablet Commonly known as: ALDACTONE     TAKE these medications   acidophilus Caps capsule Take 1 capsule by mouth daily.   albuterol 108 (90 Base) MCG/ACT inhaler Commonly known as: VENTOLIN HFA Inhale 2 puffs into the lungs  every 4 (four) hours as needed for wheezing or shortness of breath.   Aspirin Low Dose 81 MG EC tablet Generic drug: aspirin TAKE 1 TABLET (81 MG TOTAL) BY MOUTH DAILY (MORNING) What changed: See the new instructions.   atorvastatin 80 MG tablet Commonly known as: LIPITOR Take 1 tablet (80 mg total) by mouth daily.   colchicine 0.6 MG tablet Take 1 tablet (0.6 mg total) by mouth daily.   empagliflozin 10 MG Tabs tablet Commonly known as: Jardiance Take 1 tablet (10 mg total) by mouth daily.   gabapentin 100 MG capsule Commonly known as: NEURONTIN Take 1 capsule (100 mg total) by mouth 2 (two) times daily.   ivabradine 5 MG Tabs tablet Commonly known as: CORLANOR Take 1 tablet (5 mg total) by mouth 2 (two) times daily with a meal.   nicotine 7 mg/24hr patch Commonly known as: NICODERM CQ - dosed in mg/24 hr Place 1 patch (7 mg total) onto the skin daily. Start taking on: May 22, 2021   pantoprazole 40 MG tablet Commonly known as: PROTONIX Take 1 tablet (40 mg total) by mouth 2 (two) times daily.   polyethylene glycol 17 g packet Commonly known as: MIRALAX / GLYCOLAX Take 17 g by mouth 2 (two) times daily.   potassium chloride SA 20 MEQ tablet Commonly known as: KLOR-CON Take 2 tablets (40 mEq total) by mouth daily.   predniSONE 20 MG tablet Commonly known as: DELTASONE Take 2 tablets (40 mg total) by mouth daily with breakfast. Start taking on: May 22, 2021   Torsemide 40 MG Tabs Take 80 mg by mouth daily. What changed:   medication strength  See the new instructions.      No Known Allergies  Follow-up Information    [facility] Follow up on 06/26/2021.   Why: 1:30 with Freeman Caldron, this is the earliest available apt Contact information: [address] [place] [address] [phone]               The results of significant diagnostics from this hospitalization (including imaging,  microbiology, ancillary and laboratory) are listed below for reference.    Significant Diagnostic Studies: DG Chest 2 View  Result Date: 05/13/2021 CLINICAL DATA:  Chest pain for 1 day, no known injury, initial encounter EXAM: CHEST - 2 VIEW COMPARISON:  10/31/2020 FINDINGS: Cardiac shadow is enlarged but stable. Left chest wall port is noted in satisfactory position. The lungs are well aerated bilaterally. No focal infiltrate or sizable effusion is seen. Some minimal scarring in the right upper lobe is noted. No other focal abnormality is noted. IMPRESSION: No acute abnormality noted. Stable cardiomegaly. Electronically Signed   By: Inez Catalina M.D.   On: 05/13/2021 16:59   DG Ankle 2 Views Left  Result Date: 05/17/2021 CLINICAL DATA:  Left ankle pain and swelling. EXAM: LEFT ANKLE - 2 VIEW COMPARISON:  None. FINDINGS: There is no evidence of fracture or dislocation. Ankle mortise and alignment are preserved. Mild talonavicular and midfoot degenerative change. No erosion or bone destruction. Mild generalized soft tissue edema. No soft tissue air. IMPRESSION: 1. Mild generalized soft tissue edema. 2. No acute osseous abnormality. 3. Mild talonavicular and midfoot degenerative  change. Electronically Signed   By: Keith Rake M.D.   On: 05/17/2021 18:58   DG CHEST PORT 1 VIEW  Result Date: 05/14/2021 CLINICAL DATA:  Chest pain and shortness of breath EXAM: PORTABLE CHEST 1 VIEW COMPARISON:  05/14/2021 FINDINGS: Cardiomegaly without pulmonary edema. No pleural effusion or pneumothorax. Left chest wall Port-A-Cath with tip in the lower SVC. Right approach PICC line with tip in the lower SVC. No focal airspace consolidation. IMPRESSION: Cardiomegaly without pulmonary edema. Electronically Signed   By: Ulyses Jarred M.D.   On: 05/14/2021 20:38   DG CHEST PORT 1 VIEW  Result Date: 05/14/2021 CLINICAL DATA:  Post PICC line placement EXAM: PORTABLE CHEST 1 VIEW COMPARISON:  CT 04/09/2020, radiograph  05/13/2021 FINDINGS: Interval placement of a right upper extremity PICC tip which appears to cross midline, likely directed into the left innominate vein, terminating inferior to portion the more appropriately positioned the left IJ approach Port-A-Cath which is seen terminating at the superior cavoatrial junction. Cardiomegaly is similar to comparison exam. Low volumes and atelectasis. Bandlike scarring or subsegmental atelectasis in right mid to upper lung. Mild pulmonary vascular congestion with few peripheral septal lines, could reflect early developing edema. No new consolidative process. No pneumothorax or effusion. IMPRESSION: Right upper extremity PICC tip appears to terminate in the left innominate vein. Recommend repositioning. Left IJ approach Port-A-Cath tip remains appropriately positioned at the superior cavoatrial junction. Cardiomegaly with pulmonary vascular congestion and septal thickening which could reflect early interstitial edema. Can be indicative of CHF or volume overload in the appropriate clinical setting. These results will be called to the ordering clinician or representative by the Radiologist Assistant, and communication documented in the PACS or [employer]. Electronically Signed   By: Lovena Le M.D.   On: 05/14/2021 17:13   DG Abd Portable 1V  Result Date: 05/20/2021 CLINICAL DATA:  Abdominal pain EXAM: PORTABLE ABDOMEN - 1 VIEW COMPARISON:  CT 02/20/2021 FINDINGS: Large colonic stool burden. Scattered air-filled loops of bowel noted throughout the abdomen and pelvis but without high-grade obstructive pattern. Few stable coarse calcifications noted over the hepatic parenchyma. No new suspicious abdominal calcifications are evident. No acute or worrisome osseous abnormality. Telemetry leads overlie the lower chest and abdomen. IMPRESSION: Large colonic stool burden.  No high-grade obstruction. Stable calcification projecting over the liver. Electronically Signed   By: Lovena Le M.D.   On: 05/20/2021 19:55   [place] EKG SITE RITE  Result Date: 05/14/2021 If Site Rite image not attached, placement could not be confirmed due to current cardiac rhythm.   Microbiology: Recent Results (from the past 240 hour(s))  Resp Panel by RT-PCR (Flu A&B, Covid) Nasopharyngeal Swab     Status: None   Collection Time: 05/13/21  6:47 PM   Specimen: Nasopharyngeal Swab; Nasopharyngeal(NP) swabs in vial transport medium  Result Value Ref Range Status   SARS Coronavirus 2 by RT PCR NEGATIVE NEGATIVE Final    Comment: (NOTE) SARS-CoV-2 target nucleic acids are NOT DETECTED.  The SARS-CoV-2 RNA is generally detectable in upper respiratory specimens during the acute phase of infection. The lowest concentration of SARS-CoV-2 viral copies this assay can detect is 138 copies/mL. A negative result does not preclude SARS-Cov-2 infection and should not be used as the sole basis for treatment or other patient management decisions. A negative result may occur with  improper specimen collection/handling, submission of specimen other than nasopharyngeal swab, presence of viral mutation(s) within the areas targeted by this assay, and inadequate number of  viral copies(<138 copies/mL). A negative result must be combined with clinical observations, patient history, and epidemiological information. The expected result is Negative.  Fact Sheet for Patients:  EntrepreneurPulse.com.au  Fact Sheet for Healthcare Providers:  IncredibleEmployment.be  This test is no t yet approved or cleared by the [place] FDA and  has been authorized for detection and/or diagnosis of SARS-CoV-2 by FDA under an Emergency Use Authorization (EUA). This EUA will remain  in effect (meaning this test can be used) for the duration of the COVID-19 declaration under Section 564(b)(1) of the Act, 21 U.S.C.section 360bbb-3(b)(1), unless the authorization is terminated  or  revoked sooner.       Influenza A by PCR NEGATIVE NEGATIVE Final   Influenza B by PCR NEGATIVE NEGATIVE Final    Comment: (NOTE) The Xpert Xpress SARS-CoV-2/FLU/RSV plus assay is intended as an aid in the diagnosis of influenza from Nasopharyngeal swab specimens and should not be used as a sole basis for treatment. Nasal washings and aspirates are unacceptable for Xpert Xpress SARS-CoV-2/FLU/RSV testing.  Fact Sheet for Patients: EntrepreneurPulse.com.au  Fact Sheet for Healthcare Providers: IncredibleEmployment.be  This test is not yet approved or cleared by the [place] FDA and has been authorized for detection and/or diagnosis of SARS-CoV-2 by FDA under an Emergency Use Authorization (EUA). This EUA will remain in effect (meaning this test can be used) for the duration of the COVID-19 declaration under Section 564(b)(1) of the Act, 21 U.S.C. section 360bbb-3(b)(1), unless the authorization is terminated or revoked.  Performed at [facility], [place] [address] [place], [place] [address]      Labs: Basic Metabolic Panel: Recent Labs  Lab 05/15/21 0521 05/16/21 0500 05/17/21 0438 05/18/21 0500 05/19/21 0535 05/20/21 0518 05/20/21 1850 05/21/21 0455  NA 132* 129* 130* 131* 134* 130* 124* 130*  K 3.2* 3.8 4.3 3.8 4.4 4.1 4.9 3.9  CL 90* 89* 88* 90* 93* 92* 90* 96*  CO2 34* 33* 34* 32 31 29 25 27   GLUCOSE 182* 191* 141* 126* 115* 147* 237* 164*  BUN 40* 37* 38* 43* 48* 65* 67* 53*  CREATININE 2.02* 1.87* 1.90* 1.70* 1.94* 2.71* 2.23* 1.66*  CALCIUM 8.8* 8.8* 9.2 8.8* 9.4 9.0 8.9 8.9  MG 1.9 1.9 1.9  --   --   --  1.8 2.0   Liver Function Tests: Recent Labs  Lab 05/16/21 0500 05/20/21 1850  AST 36 22  ALT 134* 60*  ALKPHOS 175* 131*  BILITOT 1.4* 1.0  PROT 5.9* 6.8  ALBUMIN 2.6* 3.0*   Recent Labs  Lab 05/20/21 1850  LIPASE 61*   No results for input(s): AMMONIA in the last 168 hours. CBC: Recent Labs   Lab 05/20/21 1850 05/21/21 0455  WBC 8.8 9.8  NEUTROABS 8.0*  --   HGB 12.5* 12.4*  HCT 37.6* 37.4*  MCV 85.1 84.8  PLT 247 250   Cardiac Enzymes: Recent Labs  Lab 05/20/21 1850  CKTOTAL 43*   BNP: BNP (last 3 results) Recent Labs    10/31/20 1604 11/12/20 1054 05/13/21 1611  BNP 1,322.2* 970.2* 1,976.6*    ProBNP (last 3 results) No results for input(s): PROBNP in the last 8760 hours.  CBG: Recent Labs  Lab 05/20/21 1801  GLUCAP 187*       Signed:  Nita Sells MD   [facility] 05/21/2021, 11:54 AM

## 2021-05-21 NOTE — Hospital Course (Addendum)
13 community dwelling black male CAD + PCI + HFrEF EF <20% RV dysfunction Chronic cocaine habituation Stage IIIa gastric CA followed by Dr. Malachy Mood status post FOLFOX 6 cycles/XRT Dr. Lisbeth Renshaw last seen 05/15/2021-had a complete response to FOLFOX per CT scan  Admitted with SOB, CP-advanced heart failure team/palliative care/oncology consulted Management guided by heart failure team  Labs Sodium 124-->130 BUNs/creatinine 67/2.2-->53/1.6 Troponin 193-->191 Hemoglobin 12.4 platelet 250 WBC 9.8 2 view x-ray 5/24 large colonic stool burden no high-grade obstruction, stable calcification over liver  ?  Gout ?  Homeless-living in hotel  Torsemide 80 mg daily  Jardiance 10 mg daily  Ivabradine 5 mg twice a day ASa 81 mg daily  Atorvastatin 80 mg daily

## 2021-05-21 NOTE — Progress Notes (Addendum)
Patient ID: Tyler Wong, male   DOB: Jun 20, 1966, 55 y.o.   MRN: [id number]     Advanced Heart Failure Rounding Note  PCP-Cardiologist: Shelva Majestic, MD   Subjective:   Yesterday spiro, dig, entresto stopped due to AKI. Creatinine trending down 2.7>1.6   CO-OX 50%.   Feels ok. Denies SOB.   Objective:   Weight Range: 69.2 kg Body mass index is 21.9 kg/m.   Vital Signs:   Temp:  [97.6 F (36.4 C)-98.2 F (36.8 C)] 97.6 F (36.4 C) (05/25 0758) Pulse Rate:  [93-106] 106 (05/25 0758) Resp:  [16-18] 18 (05/25 0451) BP: (90-100)/(60-81) 94/69 (05/25 0758) SpO2:  [88 %-100 %] 98 % (05/25 0758) Weight:  [69.2 kg] 69.2 kg (05/25 0451) Last BM Date: 05/20/21  Weight change: Filed Weights   05/19/21 0409 05/20/21 0450 05/21/21 0451  Weight: 67.3 kg 68.9 kg 69.2 kg    Intake/Output:   Intake/Output Summary (Last 24 hours) at 05/21/2021 1034 Last data filed at 05/21/2021 0718 Gross per 24 hour  Intake 330 ml  Output 1650 ml  Net -1320 ml      Physical Exam  CVP 9-10  General:  Sitting on the side of the bed.  No resp difficulty HEENT: normal Neck: supple. JVP 9-10 . Carotids 2+ bilat; no bruits. No lymphadenopathy or thryomegaly appreciated. Cor: PMI nondisplaced. Regular rate & rhythm. No rubs or murmurs. +S3  Lungs: clear Abdomen: soft, nontender, nondistended. No hepatosplenomegaly. No bruits or masses. Good bowel sounds. Extremities: no cyanosis, clubbing, rash, edema. RUE PICC Neuro: alert & orientedx3, cranial nerves grossly intact. moves all 4 extremities w/o difficulty. Affect pleasant   Telemetry  SR-ST 90-100s  EKG    No new EKG to review   Labs    CBC Recent Labs    05/20/21 1850 05/21/21 0455  WBC 8.8 9.8  NEUTROABS 8.0*  --   HGB 12.5* 12.4*  HCT 37.6* 37.4*  MCV 85.1 84.8  PLT 247 778   Basic Metabolic Panel Recent Labs    05/20/21 1850 05/21/21 0455  NA 124* 130*  K 4.9 3.9  CL 90* 96*  CO2 25 27  GLUCOSE 237* 164*  BUN 67*  53*  CREATININE 2.23* 1.66*  CALCIUM 8.9 8.9  MG 1.8 2.0   Liver Function Tests Recent Labs    05/20/21 1850  AST 22  ALT 60*  ALKPHOS 131*  BILITOT 1.0  PROT 6.8  ALBUMIN 3.0*   Recent Labs    05/20/21 1850  LIPASE 61*   Cardiac Enzymes Recent Labs    05/20/21 1850  CKTOTAL 43*    BNP: BNP (last 3 results) Recent Labs    10/31/20 1604 11/12/20 1054 05/13/21 1611  BNP 1,322.2* 970.2* 1,976.6*    ProBNP (last 3 results) No results for input(s): PROBNP in the last 8760 hours.   D-Dimer No results for input(s): DDIMER in the last 72 hours. Hemoglobin A1C No results for input(s): HGBA1C in the last 72 hours. Fasting Lipid Panel No results for input(s): CHOL, HDL, LDLCALC, TRIG, CHOLHDL, LDLDIRECT in the last 72 hours. Thyroid Function Tests No results for input(s): TSH, T4TOTAL, T3FREE, THYROIDAB in the last 72 hours.  Invalid input(s): FREET3  Other results:   Imaging    DG Abd Portable 1V  Result Date: 05/20/2021 CLINICAL DATA:  Abdominal pain EXAM: PORTABLE ABDOMEN - 1 VIEW COMPARISON:  CT 02/20/2021 FINDINGS: Large colonic stool burden. Scattered air-filled loops of bowel noted throughout the abdomen and pelvis but without  high-grade obstructive pattern. Few stable coarse calcifications noted over the hepatic parenchyma. No new suspicious abdominal calcifications are evident. No acute or worrisome osseous abnormality. Telemetry leads overlie the lower chest and abdomen. IMPRESSION: Large colonic stool burden.  No high-grade obstruction. Stable calcification projecting over the liver. Electronically Signed   By: Lovena Le M.D.   On: 05/20/2021 19:55     Medications:     Scheduled Medications: . aspirin EC  81 mg Oral Daily  . atorvastatin  80 mg Oral Daily  . Chlorhexidine Gluconate Cloth  6 each Topical Daily  . colchicine  0.6 mg Oral Daily  . enoxaparin (LOVENOX) injection  40 mg Subcutaneous Q24H  . gabapentin  100 mg Oral BID  .  ivabradine  5 mg Oral BID WC  . lactulose  30 g Oral BID  . nicotine  7 mg Transdermal Daily  . polyethylene glycol  17 g Oral BID  . potassium chloride  40 mEq Oral Once  . predniSONE  40 mg Oral Q breakfast  . sodium chloride flush  10-40 mL Intracatheter Q12H  . sodium chloride flush  3 mL Intravenous Q12H    Infusions: . sodium chloride      PRN Medications: sodium chloride, ipratropium-albuterol, methocarbamol, oxyCODONE, sodium chloride flush, sodium chloride flush   Assessment/Plan   1. Acute on chronic systolic HF:  Anterior MI in 2015, DES to LAD.  Echo in 2015 with EF 40-45%. Cath in 10/19 showed 90% ostial OM1 and 70% PLV, no interventional target.  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 Last Echo 10/21 showed EF 20-25% with mild RV dysfunction.  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  Cocaine likely plays a role in his cardiomyopathy. Multiple re-admits for cardiogenic shock requiring milrinone. Unfortunately, not a candidate for advanced therapies or home milrinone with active use of cocaine, homelessness, and gastric cancer.  He has end-stage HF.  Now readmitted w/ volume overloaded.   He has been sporadic with his meds and is cocaine+.  Creatinine up to 2.09 initially, well above baseline. Initial poor response to IV Lasix raising concern for low output => co-ox low on PICC. Started on milrinone 0.25 then weaned off.  CO-OX  50%. Stop daily CO-OX   CVP 9-10. Continue torsemide 80 mg daily. I dont think he will take torsemide twice a day.  - Restart jardiance 10 mg daily.  - Given social situation, gastric cancer, and ongoing substance abuse,  he is not a candidate for any advanced therapies including home milrinone - No bb with low output.  - Can continue home ivabradine 5 mg tid.   - Keep off  spiro and dig w/ AKI . Renal function improved.  - No BP room for Entresto or Bidil.  2. CAD:  H/o anterior STEMI with DES to LAD in 7/15. Cath in 10/19 with 90% ostial OM1 and 70% PLV, no interventional target.  - No chest pain.   - HS-TnI mildly elevated with no trend, suspect demand ischemia with volume overload and low output.  - Continue ASA 81 and atorvastatin 80 mg daily.  - needs to refrain from using cocaine 3. Cocaine abuse: Has been +cocaine each hospital visit.  Encouraged abstinence.  4. Active smoker: Urged cessation.  5. AKI on CKD Stage 3:  Creatinine improved on milrinone. Cardiorenal syndrome, continue milrinone while inpatient.  - Creatinine 1.9>1.7>1.9>2.7>1.6 6. Mitral Regurgitation: Moderate to severe on 10/21 echo, suspect functional MR  from dilated CMP.  - Not great Mitraclip candidate with cocaine abuse and compliance issues.  7. Gastric Cancer: Intramucosal adenocarcinoma. Has had FOLFOX chemo.  Was supposed to get radiation but has not been compliant with followup. Will need followup with heme/onc.   8. Elevated LFTs: Suspect congestive hepatopathy, improved.  9. Hyponatremia: 130  today. Hypervolemic hyponatremia.   - Fluid restrict.  10. Left ankle pain: Possible gout.  - Uric 11.4  - Can get colchicine 0.6 mg daily.  - Would give him a prednisone burst, 40 mg today with taper over 5 days.   SW following to assist with disposition. CSW received call from pt [employer] member services worker who states that they have approved patient for 7 nights in a hotel- case worker emailed [place] hotel confirmation details.   As noted above, not a candidate for home milrinone or advanced therapies.    D/C HF meds OK for d/c from HF perspective .  Torsemide 80 mg daily  Jardiance 10 mg daily  Ivabradine 5 mg twice a day ASa 81 mg daily  Atorvastatin 80 mg daily     Length of Stay: [place], NP  05/21/2021, 10:34 AM  Advanced Heart Failure Team Pager [phone] (M-F; 7a - 5p)  Please contact [facility] for night-coverage after hours (5p -7a ) and  weekends on [email or web address]  Patient seen with NP, agree with the above note.   Co-ox marginal at 50% but creatinine trending down at 1.6.  He is not a candidate for advanced therapies/home milrinone.  CVP 9-10.    He wants to leave the hospital.  Think this would be ok today.  Will restart torsemide at 80 mg daily for home.  Restart Jardiance 10 mg daily and continue ivabradine.  We will try to get him close followup in CHF clinic.   Loralie Champagne 05/21/2021 12:01 PM

## 2021-05-21 NOTE — Telephone Encounter (Signed)
Pt [place] today per inpatient team.  Will be going to a hotel until 5/31 paid for by his insurance but is worried about obtaining food.  Was provided with food pantry and free meal list by inpatient team- this CSW also able to provide [place] gift card to assist with some immediate food costs as well as a few more bus passes to help him get to and from the grocery store.  Pt usually gets food stamps but has to work on Mining engineer- states he is in the process of doing so- CSW encouraged him to reach out if he needs help completing.  CSW also updated Advertising account executive regarding pt DC plan- hopeful they will be able to get pt in for an intake by 5/31 when hotel stay runs out.  Will continue to follow in outpatient setting and assist as needed  [name], [facility] Desk#: [phone] Cell#: [phone]

## 2021-05-21 NOTE — TOC Transition Note (Addendum)
Transition of Care [facility]) - CM/SW Discharge Note Heart Failure   Patient Details  Name: Tyler Wong MRN: [id number] Date of Birth: Aug 07, 1966  Transition of Care [facility]) CM/SW Contact:  [place], [place] Phone Number: 05/21/2021, 1:08 PM   Clinical Narrative:    CSW spoke with patient at bedside to discuss possible discharge today to the [facility]) for 6 nights. Patient asked about food while at the hotel and CSW spoke with the outpatient CSW who reported she can provide a [place] card for the patient to get food and CSW provided bus passes to the patient to be able to get there. CSW provided [name] with an appointment card for the [facility] outpatient clinic and encouraged them to follow up and to attend the appointment and bring their medications and if anything changes to please reach out so that CSW/HV clinic team can provide support.  CSW heard from the discharge nurse that the patient's picc line has been removed and CSW called [facility] for an [employer] black cadillac (license plate [id number]) arrival time 2:21pm at the front entrance of the hospital.  CSW will sign off for now as social work intervention is no longer needed. Please consult [place] again if new needs arise.   Final next level of care: Home/Self Care Barriers to Discharge: No Barriers Identified,Homeless with medical needs   Patient Goals and CMS Choice        Discharge Placement                Patient to be transferred to facility by: [facility] Name of family member notified: sister Tyler Wong [phone] Patient and family notified of of transfer: 05/21/21  Discharge Plan and Services In-house Referral: Clinical Social Work                                   Social Determinants of Health (SDOH) Interventions Food Insecurity Interventions: Other (Comment) (patient reports he does get food stamps) Financial Strain Interventions: Other (Comment)  (patient reported he has tried applying for disability but missed an appointment and is unsure where that stands currently) Housing Interventions: Other (Comment) (outpatient HF CSW working on shelter/housing resources) Transportation Interventions: [facility]   Readmission Risk Interventions Readmission Risk Prevention Plan 05/20/2021 11/04/2020 10/09/2020  Transportation Screening Complete Complete Complete  Medication Review Press photographer) - Complete Complete  PCP or Specialist appointment within 3-5 days of discharge Complete Complete -  [place] or [place] Complete Complete Complete  SW Recovery Care/Counseling Consult Complete Complete Complete  Palliative Care Screening Not Applicable Not Applicable Not [place] Not Applicable Not Applicable Not Applicable  Some recent data might be hidden     Ladean Steinmeyer, MSW, LCSWA [phone] Heart Failure Social Worker

## 2021-05-21 NOTE — Progress Notes (Signed)
Pt is alert and oriented with no distress. Transported in the wheel chair to the front entrance to be transported home in [place].

## 2021-05-21 NOTE — Progress Notes (Signed)
D/C instructions given and reviewed. Tele removed. Awaiting PICC line removal.

## 2021-05-22 ENCOUNTER — Inpatient Hospital Stay: Payer: Medicaid Other

## 2021-05-22 ENCOUNTER — Inpatient Hospital Stay (HOSPITAL_BASED_OUTPATIENT_CLINIC_OR_DEPARTMENT_OTHER): Payer: Medicaid Other | Admitting: Oncology

## 2021-05-22 ENCOUNTER — Telehealth: Payer: Self-pay | Admitting: *Deleted

## 2021-05-22 ENCOUNTER — Other Ambulatory Visit: Payer: Self-pay

## 2021-05-22 ENCOUNTER — Ambulatory Visit: Payer: Medicaid Other

## 2021-05-22 ENCOUNTER — Other Ambulatory Visit: Payer: Self-pay | Admitting: Gastroenterology

## 2021-05-22 ENCOUNTER — Other Ambulatory Visit: Payer: Self-pay | Admitting: *Deleted

## 2021-05-22 VITALS — BP 124/80 | HR 76 | Temp 97.6°F | Resp 18 | Ht 70.0 in | Wt 157.4 lb

## 2021-05-22 DIAGNOSIS — J449 Chronic obstructive pulmonary disease, unspecified: Secondary | ICD-10-CM | POA: Diagnosis not present

## 2021-05-22 DIAGNOSIS — N189 Chronic kidney disease, unspecified: Secondary | ICD-10-CM | POA: Diagnosis not present

## 2021-05-22 DIAGNOSIS — C163 Malignant neoplasm of pyloric antrum: Secondary | ICD-10-CM

## 2021-05-22 DIAGNOSIS — Z95828 Presence of other vascular implants and grafts: Secondary | ICD-10-CM | POA: Diagnosis not present

## 2021-05-22 DIAGNOSIS — Z452 Encounter for adjustment and management of vascular access device: Secondary | ICD-10-CM | POA: Diagnosis not present

## 2021-05-22 DIAGNOSIS — I2510 Atherosclerotic heart disease of native coronary artery without angina pectoris: Secondary | ICD-10-CM | POA: Diagnosis not present

## 2021-05-22 DIAGNOSIS — I5084 End stage heart failure: Secondary | ICD-10-CM | POA: Diagnosis not present

## 2021-05-22 LAB — CBC WITH DIFFERENTIAL (CANCER CENTER ONLY)
Abs Immature Granulocytes: 0.08 10*3/uL — ABNORMAL HIGH (ref 0.00–0.07)
Basophils Absolute: 0 10*3/uL (ref 0.0–0.1)
Basophils Relative: 0 %
Eosinophils Absolute: 0 10*3/uL (ref 0.0–0.5)
Eosinophils Relative: 0 %
HCT: 42 % (ref 39.0–52.0)
Hemoglobin: 13.7 g/dL (ref 13.0–17.0)
Immature Granulocytes: 1 %
Lymphocytes Relative: 9 %
Lymphs Abs: 1 10*3/uL (ref 0.7–4.0)
MCH: 28 pg (ref 26.0–34.0)
MCHC: 32.6 g/dL (ref 30.0–36.0)
MCV: 85.7 fL (ref 80.0–100.0)
Monocytes Absolute: 1.7 10*3/uL — ABNORMAL HIGH (ref 0.1–1.0)
Monocytes Relative: 15 %
Neutro Abs: 8.8 10*3/uL — ABNORMAL HIGH (ref 1.7–7.7)
Neutrophils Relative %: 75 %
Platelet Count: 250 10*3/uL (ref 150–400)
RBC: 4.9 MIL/uL (ref 4.22–5.81)
RDW: 15.5 % (ref 11.5–15.5)
WBC Count: 11.6 10*3/uL — ABNORMAL HIGH (ref 4.0–10.5)
nRBC: 0 % (ref 0.0–0.2)

## 2021-05-22 MED ORDER — PANTOPRAZOLE SODIUM 40 MG PO TBEC
40.0000 mg | DELAYED_RELEASE_TABLET | Freq: Two times a day (BID) | ORAL | 2 refills | Status: DC
Start: 2021-05-22 — End: 2021-07-29

## 2021-05-22 MED ORDER — POLYETHYLENE GLYCOL 3350 17 G PO PACK: 17.0000 g | PACK | Freq: Two times a day (BID) | ORAL | 1 refills | Status: DC

## 2021-05-22 MED ORDER — SODIUM CHLORIDE 0.9% FLUSH
10.0000 mL | Freq: Once | INTRAVENOUS | Status: AC
  Administered 2021-05-22: 10 mL
  Filled 2021-05-22: qty 10

## 2021-05-22 MED ORDER — HEPARIN SOD (PORK) LOCK FLUSH 100 UNIT/ML IV SOLN
500.0000 [IU] | Freq: Once | INTRAVENOUS | Status: AC
Start: 2021-05-22 — End: 2021-05-22
  Administered 2021-05-22: 500 [IU]
  Filled 2021-05-22: qty 5

## 2021-05-22 NOTE — Telephone Encounter (Signed)
Transition Care Management Follow-up Telephone Call  Date of discharge and from where: 05/21/2021 - [facility]  How have you been since you were released from the hospital? "I'm alright"  Any questions or concerns? No  Items Reviewed:  Did the pt receive and understand the discharge instructions provided? Yes   Medications obtained and verified? Yes   Other? No   Any new allergies since your discharge? No   Dietary orders reviewed? No  Do you have support at home? No    Functional Questionnaire: (I = Independent and D = Dependent) ADLs: I  Bathing/Dressing- I  Meal Prep- I  Eating- I  Maintaining continence- I  Transferring/Ambulation- I  Managing Meds- I  Follow up appointments reviewed:   PCP Hospital f/u appt confirmed? Yes  Scheduled to see PCP on 06/26/2021 @ 1330.  [facility] f/u appt confirmed? Yes  Scheduled to see Cardiology on 06/11/2021 @ 1130.  Are transportation arrangements needed? No   If their condition worsens, is the pt aware to call PCP or go to the Emergency Dept.? Yes  Was the patient provided with contact information for the PCP's office or ED? Yes  Was to pt encouraged to call back with questions or concerns? Yes

## 2021-05-22 NOTE — Progress Notes (Signed)
[facility] OFFICE PROGRESS NOTE   Diagnosis: Gastric cancer  INTERVAL HISTORY:   Tyler Wong presents for an unscheduled visit.  He is here with his sister.  He was discharged from the hospital on 05/21/2021 after admission with heart failure.  He was discharged to a hotel and plans to transition to the [employer].  He reports abdominal pain beginning 2 days ago.  The pain is worse after eating.  He is concerned he has developed a "ulcer ".  He had to black bowel movements yesterday.  No hematemesis.  He had evidence of a gout flare in the left foot while in the hospital.  He was treated with prednisone and colchicine.  He has not started outpatient treatment.  The foot has improved.  A plain x-ray of the abdomen on 05/20/2021 revealed a large colonic stool burden.  No high-grade obstruction.  He is constipated.  Objective:  Vital signs in last 24 hours:  Blood pressure 124/80, pulse 76, temperature 97.6 F (36.4 C), temperature source Oral, resp. rate 18, height _0  (1.778 m), weight 157 lb 6.4 oz (71.4 kg).     Resp: Lungs clear bilaterally Cardio: Regular rate and rhythm GI: Soft, no mass, no hepatosplenomegaly, tender in the right upper abdomen and subxiphoid region Vascular: No leg edema Musculoskeletal: No erythema or swelling at the left foot or great toe  Portacath/PICC-without erythema  Lab Results:  Lab Results  Component Value Date   WBC 11.6 (H) 05/22/2021   HGB 13.7 05/22/2021   HCT 42.0 05/22/2021   MCV 85.7 05/22/2021   PLT 250 05/22/2021   NEUTROABS 8.8 (H) 05/22/2021    CMP  Lab Results  Component Value Date   NA 130 (L) 05/21/2021   K 3.9 05/21/2021   CL 96 (L) 05/21/2021   CO2 27 05/21/2021   GLUCOSE 164 (H) 05/21/2021   BUN 53 (H) 05/21/2021   CREATININE 1.66 (H) 05/21/2021   CALCIUM 8.9 05/21/2021   PROT 6.8 05/20/2021   ALBUMIN 3.0 (L) 05/20/2021   AST 22 05/20/2021   ALT 60 (H) 05/20/2021   ALKPHOS 131 (H)  05/20/2021   BILITOT 1.0 05/20/2021   GFRNONAA 48 (L) 05/21/2021   GFRAA 55 (L) 09/21/2020    Lab Results  Component Value Date   CEA1 5.94 (H) 03/19/2021    Imaging:  DG Abd Portable 1V  Result Date: 05/20/2021 CLINICAL DATA:  Abdominal pain EXAM: PORTABLE ABDOMEN - 1 VIEW COMPARISON:  CT 02/20/2021 FINDINGS: Large colonic stool burden. Scattered air-filled loops of bowel noted throughout the abdomen and pelvis but without high-grade obstructive pattern. Few stable coarse calcifications noted over the hepatic parenchyma. No new suspicious abdominal calcifications are evident. No acute or worrisome osseous abnormality. Telemetry leads overlie the lower chest and abdomen. IMPRESSION: Large colonic stool burden.  No high-grade obstruction. Stable calcification projecting over the liver. Electronically Signed   By: Lovena Le M.D.   On: 05/20/2021 19:55    Medications: I have reviewed the patient's current medications.   Assessment/Plan: 1. Gastric cancer  CT abdomen/pelvis 11/01/2020-masslike area measuring 6.3 x 5.6 x 5.6 cm in the antral prepyloric region of the stomach  Upper endoscopy 11/05/2020-patchy white plaques in the distal esophagus; 3 nonbleeding cratered gastric ulcers with pigmented material in the gastric antrum largest 10 mm; segmental moderate inflammation characterized by congestion, erosions and erythema in the gastric antrum. Biopsy of stomach ulcer-intramucosal adenocarcinoma arising in a background of intestinal metaplasia, HER-2 negative  PET 11/2020-focal  area of hypermetabolic activity at the gastric antrum, averages into the liver and small liver lesion adjacent to stomach not excluded, small chest lymph nodes with mild increase in metabolic activity-nonspecific, symmetric parotid and extraocular muscle activity-likely physiologic  Cycle 1 FOLFOX 12/12/2020  Cycle 2 FOLFOX 12/26/2020  Cycle 3 FOLFOX 01/08/2021  Cycle 4 FOLFOX 01/22/2021  Cycle 5 FOLFOX  02/05/2021-Udenyca  Cycle 6 FOLFOX 02/19/2021-Udenyca  CTs 02/20/2021-previously noted mass of the posterior gastric antrum no longer seen. No lymphadenopathy or metastatic disease in the abdomen or pelvis. 2. CHF, LVEF 20-25% 10/04/2020 3. CAD status post prior LAD stent 4. COPD 5. Chronic kidney disease 6. Cocaine usage 7. Port-A-Cath placement 12/06/2020, Interventional Radiology 8. Mild neutropenia secondary to chemotherapy-Udenyca added with cycle 5 FOLFOX 9. Hospital admission 05/13/2021- acute exacerbation of CHF/end-stage heart failure    Disposition: [name] has a history of gastric cancer.  He had clinical and radiologic improvement after FOLFOX chemotherapy.  He was scheduled begin concurrent capecitabine and radiation in March, but he was lost to follow-up.  He was discharged from the hospital yesterday after an admission with exacerbation of heart failure.  He complains of abdominal pain and "black "stool today.  The hemoglobin is stable.  We will refer him to Dr. Michail Sermon to consider repeat endoscopy to evaluate the tumor status and place fiducial markers for radiation.  He will be scheduled for a CT of the abdomen 03/27/2021 and return for an office visit on 05/28/2021.  I encouraged him to begin Protonix and use as needed Maalox.  He will begin MiraLAX.  He will use nutrition supplements.  I recommended he discontinue colchicine and Protonix.  He will return to the emergency room for symptoms of anemia.  Betsy Coder, MD  05/22/2021  1:22 PM

## 2021-05-22 NOTE — Telephone Encounter (Signed)
Left message with [facility] voice mail for MD calls: Needs appointment asap with Dr. Michail Sermon for placement of fiducial markers to prepare for radiation therapy. Requested GI navigator f/u.

## 2021-05-22 NOTE — Progress Notes (Signed)
Patient provided gas card at visit today as well as case of Ensure.

## 2021-05-23 ENCOUNTER — Ambulatory Visit: Payer: Medicaid Other

## 2021-05-27 ENCOUNTER — Ambulatory Visit: Payer: Medicaid Other

## 2021-05-27 ENCOUNTER — Ambulatory Visit (HOSPITAL_BASED_OUTPATIENT_CLINIC_OR_DEPARTMENT_OTHER): Admission: RE | Admit: 2021-05-27 | Payer: Medicaid Other | Source: Ambulatory Visit

## 2021-05-27 ENCOUNTER — Telehealth (HOSPITAL_COMMUNITY): Payer: Self-pay | Admitting: Licensed Clinical Social Worker

## 2021-05-27 NOTE — Telephone Encounter (Signed)
CSW called pt to check in as his hotel room was only paid for through today.  Was unable to reach- left VM requesting return call.  Will continue to follow and assist as needed  [name], [facility] Desk#: [phone] Cell#: [phone]

## 2021-05-28 ENCOUNTER — Ambulatory Visit: Payer: Medicaid Other

## 2021-05-28 ENCOUNTER — Emergency Department (HOSPITAL_COMMUNITY)
Admission: EM | Admit: 2021-05-28 | Discharge: 2021-05-28 | Disposition: A | Discharge status: Home / Self Care | Payer: Medicaid Other | Attending: Emergency Medicine | Admitting: Emergency Medicine

## 2021-05-28 ENCOUNTER — Other Ambulatory Visit: Payer: Self-pay

## 2021-05-28 ENCOUNTER — Inpatient Hospital Stay: Payer: Medicaid Other | Attending: Oncology | Admitting: Nurse Practitioner

## 2021-05-28 ENCOUNTER — Telehealth (HOSPITAL_COMMUNITY): Payer: Self-pay | Admitting: Licensed Clinical Social Worker

## 2021-05-28 ENCOUNTER — Telehealth: Payer: Self-pay

## 2021-05-28 DIAGNOSIS — Z8616 Personal history of COVID-19: Secondary | ICD-10-CM | POA: Insufficient documentation

## 2021-05-28 DIAGNOSIS — Z951 Presence of aortocoronary bypass graft: Secondary | ICD-10-CM | POA: Insufficient documentation

## 2021-05-28 DIAGNOSIS — H5710 Ocular pain, unspecified eye: Secondary | ICD-10-CM | POA: Diagnosis not present

## 2021-05-28 DIAGNOSIS — Z79899 Other long term (current) drug therapy: Secondary | ICD-10-CM | POA: Insufficient documentation

## 2021-05-28 DIAGNOSIS — J449 Chronic obstructive pulmonary disease, unspecified: Secondary | ICD-10-CM | POA: Insufficient documentation

## 2021-05-28 DIAGNOSIS — D631 Anemia in chronic kidney disease: Secondary | ICD-10-CM | POA: Insufficient documentation

## 2021-05-28 DIAGNOSIS — F17210 Nicotine dependence, cigarettes, uncomplicated: Secondary | ICD-10-CM | POA: Diagnosis not present

## 2021-05-28 DIAGNOSIS — N183 Chronic kidney disease, stage 3 unspecified: Secondary | ICD-10-CM | POA: Insufficient documentation

## 2021-05-28 DIAGNOSIS — Z7982 Long term (current) use of aspirin: Secondary | ICD-10-CM | POA: Insufficient documentation

## 2021-05-28 DIAGNOSIS — I2510 Atherosclerotic heart disease of native coronary artery without angina pectoris: Secondary | ICD-10-CM | POA: Diagnosis not present

## 2021-05-28 DIAGNOSIS — I5043 Acute on chronic combined systolic (congestive) and diastolic (congestive) heart failure: Secondary | ICD-10-CM | POA: Diagnosis not present

## 2021-05-28 DIAGNOSIS — Z85028 Personal history of other malignant neoplasm of stomach: Secondary | ICD-10-CM | POA: Insufficient documentation

## 2021-05-28 DIAGNOSIS — H579 Unspecified disorder of eye and adnexa: Secondary | ICD-10-CM | POA: Diagnosis not present

## 2021-05-28 DIAGNOSIS — Q159 Congenital malformation of eye, unspecified: Secondary | ICD-10-CM

## 2021-05-28 DIAGNOSIS — I130 Hypertensive heart and chronic kidney disease with heart failure and stage 1 through stage 4 chronic kidney disease, or unspecified chronic kidney disease: Secondary | ICD-10-CM | POA: Insufficient documentation

## 2021-05-28 NOTE — Telephone Encounter (Signed)
Client notified that his stay at the hotel was extended for another week. Introduced self by phone as CN and that I would call back this pm to talk more with him. Client was very appreciative of assistance ,will notify SW at [facility] that client is being picked up under the [facility] program

## 2021-05-28 NOTE — Discharge Instructions (Signed)
Return for any problem.  ?

## 2021-05-28 NOTE — ED Notes (Signed)
Pt ambulating to the bathroom, no distress.

## 2021-05-28 NOTE — Telephone Encounter (Signed)
Pt DC'd from ED.  Came to clinic to ask for bus passes to help run errands after he gets back to the hotel.  CSW provided with 4 bus passes and [employer] gift card to help with food expenses.  Pt normally gets food stamps but did not recertify in time so they have been cut currently.  Pt will call [employer] today or tomorrow to see if he is still eligible to recertify or if he needs to reapply all together.  He will let CSW know if he needs help with recertification process.  Will continue to follow and assist as needed  [name], [facility] Desk#: [phone] Cell#: [phone]

## 2021-05-28 NOTE — ED Notes (Signed)
The patient was seen walking to out of the unit.

## 2021-05-28 NOTE — ED Triage Notes (Signed)
Pt reports sitting on his bed at the hotel eating soup when his eyeball came out 3/4 of the way to where he could grab his eye. Pt pushed his eyeball back in prior to EMS arrival. No injury to eye or previous eye problems. Eye is red and has blurry vision in L periphery.

## 2021-05-28 NOTE — ED Provider Notes (Signed)
[facility] EMERGENCY DEPARTMENT Provider Note   CSN: [id number] Arrival date & time: 05/28/21  1254     History Chief Complaint  Patient presents with  . Eye Problem    Tyler Wong is a 55 y.o. male.  55 year old male with prior medical history as detailed below presents for evaluation.  Patient reports that he was resting in a hotel this morning.  Suddenly his left eye fell out.  He was able to put it back in.  He now states that he has no other complaint.  He denies loss of vision in the left eye.  Denies trauma.  Note in chart from social work reports the patient was finished with his stay at this hotel.  There was no clear arrangement for a shelter spot tonight.  Patient then called social work reporting the sudden " falling out of of the left eye." He was advised to come to the ED for evaluation.  The history is provided by the patient and medical records.  Eye Problem Location:  Left eye Quality: "eye fell out" Severity:  Unable to specify Onset quality:  Unable to specify Timing:  Unable to specify Progression:  Resolved      Past Medical History:  Diagnosis Date  . CAD in native artery 07/17/14   STEMI- LAD stenosis with DES  . CHF (congestive heart failure) ([place])   . Cocaine abuse ([place])   . COPD (chronic obstructive pulmonary disease) ([place])   . Dyspnea   . Gastric cancer ([place]) 11/01/2020  . GERD (gastroesophageal reflux disease)   . Hypertension   . ST elevation myocardial infarction (STEMI) involving left anterior descending (LAD) coronary artery with complication ([place])   . Tobacco abuse 07/17/2014    Patient Active Problem List   Diagnosis Date Noted  . Port-A-Cath in place 05/22/2021  . Acute dyspnea   . CHF exacerbation ([place]) 05/13/2021  . Gastric cancer ([place]) 11/29/2020  . Abdominal pain, generalized 11/05/2020  . Congestive heart failure (CHF) ([place]) 11/01/2020  . CHF (congestive heart failure) ([place]) 10/31/2020  . Palliative care  by specialist   . Goals of care, counseling/discussion   . Acute systolic HF (heart failure) ([place]) 10/03/2020  . Acute on chronic systolic CHF (congestive heart failure) ([place]) 10/03/2020  . Acute renal failure ([place])   . Acute respiratory disease due to COVID-19 virus 09/20/2020  . Acute respiratory failure due to COVID-19 ([place]) 09/19/2020  . Cardiorenal syndrome 06/29/2020  . 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 Acute on chronic clinical systolic heart failure ([place]) 06/22/2020  . Acute on chronic systolic (congestive) heart failure ([place]) 06/22/2020  . Palliative care encounter   . Cardiogenic shock ([place]) 04/11/2020  . CKD (chronic kidney disease) stage 3, GFR 30-59 ml/min (HCC) 04/11/2020  . Symptomatic anemia 04/09/2020  . Acute on chronic HFrEF (heart failure with reduced ejection fraction) ([place]) 05/05/2019  . HFrEF (heart failure with reduced ejection fraction) ([place]) 05/04/2019  . Acute combined systolic and diastolic congestive heart failure ([place]) 10/02/2018  . Hepatitis C 04/13/2017  . Current non-adherence to medical treatment 04/13/2017  . Acute on chronic combined systolic (congestive) and diastolic (congestive) heart failure ([place]) 04/13/2017  . HTN (hypertension) 04/12/2017  . Insomnia 01/18/2017  . Chronic combined systolic and diastolic congestive heart failure ([place]) 01/04/2017  . Hyperlipidemia 01/04/2017  . Chest pain 12/30/2016  . Coronary artery disease involving native heart 07/18/2014  . Tobacco abuse 07/17/2014  . Cocaine use 07/17/2014    Past  Surgical History:  Procedure Laterality Date  . BIOPSY  11/05/2020   Procedure: BIOPSY;  Surgeon: Wilford Corner, MD;  Location: [facility];  Service: Endoscopy;;  . CORONARY ANGIOPLASTY WITH STENT PLACEMENT  07/18/14   resolute DES to LAD STEMI  . ESOPHAGOGASTRODUODENOSCOPY N/A 11/05/2020   Procedure: ESOPHAGOGASTRODUODENOSCOPY (EGD);  Surgeon: Wilford Corner, MD;  Location: [facility];   Service: Endoscopy;  Laterality: N/A;  . IR IMAGING GUIDED PORT INSERTION  12/06/2020  . LEFT HEART CATH N/A 07/17/2014   Procedure: LEFT HEART CATH;  Surgeon: [name], MD;  Location: [facility] CATH LAB;  Service: Cardiovascular;  Laterality: N/A;  . PERCUTANEOUS CORONARY STENT INTERVENTION (PCI-S)  07/17/2014   Procedure: PERCUTANEOUS CORONARY STENT INTERVENTION (PCI-S);  Surgeon: [name], MD;  Location: [facility] CATH LAB;  Service: Cardiovascular;;  . RIGHT/LEFT HEART CATH AND CORONARY ANGIOGRAPHY N/A 10/03/2018   Procedure: RIGHT/LEFT HEART CATH AND CORONARY ANGIOGRAPHY;  Surgeon: [name], MD;  Location: [facility] CV LAB;  Service: Cardiovascular;  Laterality: N/A;       Family History  Problem Relation Age of Onset  . Cirrhosis Father   . Heart attack Mother   . Heart attack Sister   . Heart failure Sister   . Heart attack Brother     Social History   Tobacco Use  . Smoking status: Current Every Day Smoker    Packs/day: 0.25    Years: 30.00    Pack years: 7.50    Types: Cigarettes  . Smokeless tobacco: Never Used  . Tobacco comment: .5 PK PER DAY  Vaping Use  . Vaping Use: Never used  Substance Use Topics  . Alcohol use: No    Comment: Patient denies abuse, states social drinker  . Drug use: Yes    Types: Cocaine, "Crack" cocaine    Comment: heroin    Home Medications Prior to Admission medications   Medication Sig Start Date End Date Taking? Authorizing Provider  acidophilus (RISAQUAD) CAPS capsule Take 1 capsule by mouth daily.    [provider]  albuterol (VENTOLIN HFA) 108 (90 Base) MCG/ACT inhaler Inhale 2 puffs into the lungs every 4 (four) hours as needed for wheezing or shortness of breath. 01/24/20   [name], NP  ASPIRIN LOW DOSE 81 MG EC tablet TAKE 1 TABLET (81 MG TOTAL) BY MOUTH DAILY (MORNING) Patient taking differently: Take 81 mg by mouth in the morning. 03/05/21   Bensimhon, Shaune Pascal, MD  atorvastatin (LIPITOR) 80 MG  tablet Take 1 tablet (80 mg total) by mouth daily. 11/26/20   [name], MD  empagliflozin (JARDIANCE) 10 MG TABS tablet Take 1 tablet (10 mg total) by mouth daily. 11/26/20   [name], MD  gabapentin (NEURONTIN) 100 MG capsule Take 1 capsule (100 mg total) by mouth 2 (two) times daily. 05/21/21   Nita Sells, MD  ivabradine (CORLANOR) 5 MG TABS tablet Take 1 tablet (5 mg total) by mouth 2 (two) times daily with a meal. 11/26/20   [name], MD  nicotine (NICODERM CQ - DOSED IN MG/24 HR) 7 mg/24hr patch Place 1 patch (7 mg total) onto the skin daily. 05/22/21   Nita Sells, MD  pantoprazole (PROTONIX) 40 MG tablet Take 1 tablet (40 mg total) by mouth 2 (two) times daily. 05/22/21 06/21/21  Ladell Pier, MD  polyethylene glycol (MIRALAX / GLYCOLAX) 17 g packet Take 17 g by mouth 2 (two) times daily. 05/22/21   Ladell Pier, MD  potassium chloride SA (KLOR-CON) 20 MEQ tablet Take 2 tablets (40 mEq total) by mouth daily. 01/20/21   [name], MD  torsemide 40 MG TABS Take 80 mg by mouth daily. 05/21/21   Nita Sells, MD    Allergies    Patient has no known allergies.  Review of Systems   Review of Systems  All other systems reviewed and are negative.   Physical Exam Updated Vital Signs BP 105/74 (BP Location: Left Arm)   Pulse 99   Temp 98.6 F (37 C) (Oral)   Resp 18   SpO2 94%   Physical Exam Vitals and nursing note reviewed.  Constitutional:      General: He is not in acute distress.    Appearance: Normal appearance. He is well-developed.  HENT:     Head: Normocephalic and atraumatic.     Right Ear: External ear normal.     Left Ear: External ear normal.     Nose: Nose normal.  Eyes:     General:        Right eye: No discharge.        Left eye: No discharge.     Extraocular Movements: Extraocular movements intact.     Conjunctiva/sclera: Conjunctivae normal.     Pupils: Pupils are equal, round, and reactive to light.      Comments: No evidence of any trauma to the eyes.  No corneal injection.  No surrounding erythema.  Certainly no evidence of globe rupture or disruption of the globe itself.    Cardiovascular:     Rate and Rhythm: Normal rate and regular rhythm.     Heart sounds: Normal heart sounds.  Pulmonary:     Effort: Pulmonary effort is normal. No respiratory distress.     Breath sounds: Normal breath sounds.  Abdominal:     General: There is no distension.     Palpations: Abdomen is soft.     Tenderness: There is no abdominal tenderness.  Musculoskeletal:        General: No deformity. Normal range of motion.     Cervical back: Normal range of motion and neck supple.  Skin:    General: Skin is warm and dry.  Neurological:     Mental Status: He is alert and oriented to person, place, and time.     ED Results / Procedures / Treatments   Labs (all labs ordered are listed, but only abnormal results are displayed) Labs Reviewed - No data to display  EKG None  Radiology No results found.  Procedures Procedures   Medications Ordered in ED Medications - No data to display  ED Course  I have reviewed the triage vital signs and the nursing notes.  Pertinent labs & imaging results that were available during my care of the patient were reviewed by me and considered in my medical decision making (see chart for details).    MDM Rules/Calculators/A&P                          MDM  MSE complete  Tyler Wong was evaluated in Emergency Department on 05/28/2021 for the symptoms described in the history of present illness. He was evaluated in the context of the global COVID-19 pandemic, which necessitated consideration that the patient might be at risk for infection with the SARS-CoV-2 virus that causes COVID-19. Institutional protocols and algorithms that pertain to the evaluation of patients at risk for COVID-19 are in a state of  rapid change based on information released by regulatory  bodies including the CDC and federal and state organizations. These policies and algorithms were followed during the patient's care in the ED.   Patient with reported episode where his "eyeball came out."  Exam without objective evidence of any significant eye injury or pathology.  Importance of close follow-up is stressed.  Strict return precautions given and understood.  Final Clinical Impression(s) / ED Diagnoses Final diagnoses:  Eye abnormality    Rx / DC Orders ED Discharge Orders    None       Valarie Merino, MD 05/28/21 1510

## 2021-05-28 NOTE — Telephone Encounter (Signed)
Pt stay at [facility] inn was due to be done today- pt without plan as to where to go next.  CSW reached out to case manager at [employer] to check in regarding if they could take pt into their program.  Still no availability in the shelter but they were able to get pt another week in the hotel through [facility] project while they try to work with him on longer term placement.  Pt then called CSW and informed that his eye had fallen out- informed pt he needed to go to ED to get evaluated- pt agreeable to ambulance taking him- had [facility] get truck sent to pick pt up.  Will continue to follow and assist as needed  [name], [facility] Desk#: [phone] Cell#: [phone]

## 2021-05-29 ENCOUNTER — Ambulatory Visit: Payer: Medicaid Other

## 2021-05-29 ENCOUNTER — Telehealth: Payer: Self-pay | Admitting: Nurse Practitioner

## 2021-05-29 ENCOUNTER — Telehealth: Payer: Self-pay | Admitting: *Deleted

## 2021-05-29 NOTE — Telephone Encounter (Signed)
Evaluated chart for TOC call. Patient was D/C'd from the ED yesterday with no significant findings. Patient already has an upcoming appointment with Freeman Caldron scheduled.

## 2021-05-29 NOTE — Telephone Encounter (Signed)
Transition Care Management Unsuccessful Follow-up Telephone Call  Date of discharge and from where:  05/28/2021 [facility]  Attempts:  1st Attempt  Reason for unsuccessful TCM follow-up call:  Left voice message

## 2021-05-29 NOTE — Telephone Encounter (Signed)
Pt messaged CSW to inform that hotel stay had actually been extended through to June 1st.  CSW [employer] school teacher case worker to inform that pt would not have housing options as of the next day to see what options might be available.  Will continue to follow and assist as needed  Tyler Wong, [facility] Desk#: [phone] Cell#: [phone]

## 2021-05-30 ENCOUNTER — Telehealth: Payer: Self-pay | Admitting: *Deleted

## 2021-05-30 ENCOUNTER — Ambulatory Visit: Payer: Medicaid Other

## 2021-05-30 ENCOUNTER — Telehealth (HOSPITAL_COMMUNITY): Payer: Self-pay | Admitting: Licensed Clinical Social Worker

## 2021-05-30 NOTE — Telephone Encounter (Signed)
CSW informed by [employer] case worker, Mariann Laster, that pt has been accepted into the [facility] project and that they will plan to keep pt placed at the hotel until he is able to be accepted into the [employer].  [facility] to inform that pt now anticipated to have stable living environment and inquire about appealing disability case termination- awaiting response.  Will continue to follow and assist as needed  [name], [facility] Desk#: [phone] Cell#: [phone]

## 2021-05-30 NOTE — Telephone Encounter (Signed)
Called patient to confirm he is aware of his EGD at [facility] endoscopy on 6/15. He said he was aware, but did not know what time to get there. Informed him that endo nurse will call him with specifics and he will need a driver as well. He normally uses [place] transportation services--informed him he may not be allowed to use them for this. Confirmed w/endoscopy suite at [facility] they will call him 1-2 days prior to go over everything.

## 2021-05-30 NOTE — Telephone Encounter (Signed)
Transition Care Management Unsuccessful Follow-up Telephone Call  Date of discharge and from where:  05/28/2021 [facility]  Attempts:  2nd Attempt  Reason for unsuccessful TCM follow-up call:  Voice mail full

## 2021-06-02 ENCOUNTER — Ambulatory Visit: Payer: Medicaid Other

## 2021-06-02 NOTE — Telephone Encounter (Signed)
Transition Care Management Unsuccessful Follow-up Telephone Call  Date of discharge and from where:  05/28/2021 [facility]  Attempts:  3rd Attempt  Reason for unsuccessful TCM follow-up call:  Voice mail full

## 2021-06-03 ENCOUNTER — Telehealth: Payer: Self-pay

## 2021-06-03 ENCOUNTER — Encounter: Payer: Self-pay | Admitting: General Practice

## 2021-06-03 NOTE — Telephone Encounter (Signed)
Calls times 2 to introduce self to clients sister Peter Congo ,left messages. Left number for her to call

## 2021-06-03 NOTE — Telephone Encounter (Signed)
CN had several emails and calls from [facility] hoping to find shelter for this client . CN working with [employer] to find a space for this young man suffering from HF congestive heart and recently diagnosed with gastric cancer . Client was lost to follow up for several weeks but had been recently hospitalized and was homeless. Insurance was paying for one weeks stay and would end on today . Referral to [facility] by CN and approved . Informed client and SW at HF clinic . Arranged visit with client to officially enroll into [facility] for 05-29-2021 . Stay at [facility] extended for another week ,informed client .Marland KitchenRecord review done by CN.

## 2021-06-03 NOTE — Progress Notes (Signed)
[facility] CSW Progress Notes  Call from [name], [facility] Nurse working w patient while he is housed w [facility] and awaiting placement at [facility].  She requests that [facility] help coordinate transportation for upcoming oncology appointments - CSW notified Transportation of his CT Sim appointment on 6.17.  They plan to pick him up at his current location -  [address], [place], [place], [address] - approx 10:15.  Patient will need to notify transportation services of any changes in his address.    Edwyna Shell, LCSW Clinical Social Worker Phone:  [phone]

## 2021-06-03 NOTE — Congregational Nurse Program (Signed)
  Dept: [facility] Nurse Program Note  Date of Encounter: 06/03/2021  Past Medical History: Past Medical History:  Diagnosis Date  . CAD in native artery 07/17/14   STEMI- LAD stenosis with DES  . CHF (congestive heart failure) ([place])   . Cocaine abuse ([place])   . COPD (chronic obstructive pulmonary disease) ([place])   . Dyspnea   . Gastric cancer ([place]) 11/01/2020  . GERD (gastroesophageal reflux disease)   . Hypertension   . ST elevation myocardial infarction (STEMI) involving left anterior descending (LAD) coronary artery with complication ([place])   . Tobacco abuse 07/17/2014    Encounter Details:  CNP Questionnaire - 05/29/21 1939      Questionnaire   Do you give verbal consent to treat you today? Yes    Visit Setting Other    Location Patient Served At [facility] patient    Patient Status Homeless    Medical Provider Yes    Insurance Medicaid    Intervention Counsel;Educate;Refer;Support;Spiritual Care    Housing/Utilities No permanent housing;Worried about losing housing    Transportation Need transportation assistance;Within past 12 months, lack of transportation negatively impacted life    Food Have food insecurities;Within past 12 months, worried food would run out with no money to buy more;Within past 12 months, food ran out with no money to buy more    Medication Have medication insecurities    Referrals PCP - [facility]    Life-Saving Intervention Made Yes         Initial visit by [facility] team on 05-29-2021 to introduce program and self. Client was informed his stay at the hotel had been extended ,cllient was relieved and with the heat and his SOB that was a   life saver,expressed that team probably "saved his life "". Client was very short of breathe ,stated he was seen in ED the afternoon before because his eye popped out ?? States he was sitting on the bed and his eye popped out! Maybe stress or tension ,nothing found  During ED visit . Agreement  for [facility] signed ,client medications reviewed doesn't have inhaler and not sure when he goes to [facility] ,missed his CT scan and states they want to start his radiation treatments for his gastric cancer,states his stomach does give him pain ,wants to know status of tumor.Ask if family members were a possibility of him living with them ,no they have their own  families , has 2 daughters but they have there own life. Independent ,hotel is a long way out but states he likes the area very quite.Has food items in the room ,DSS food stamps ran out needs re certification,given gift cards for food from [place] program ,thanksed the team ,has small refrigerator in the  room ,no stove ,microwavecan do some simple cooking. . Nurse will made contact next week to try to get client seen to get inhaler ,will refer to medical clinic [facility] ,follow up with cancer center and [employer] placement. Client knows signs of SOB dangers and how to seek emergency help if needed.

## 2021-06-03 NOTE — Telephone Encounter (Signed)
Introduced self and [facility] program so that appointments and care providers are aware of client needs and services . AC will work with [facility] to include all providers to prevent duplication and  Hopefully get clients needs met . Client has several upcoming appointments in June and plans are  to start  radiation . Client expressed several people had called him but it was to much to understand .Care provider will try  to arrange visits so that client can keep appointments  And understands steps in his care. Nurse will look  For emails from providers in order to keep client updated and assure transportation is provided.

## 2021-06-03 NOTE — Telephone Encounter (Signed)
TC to client ,states he isn't doing well ,when ask what that means states his stomach pain ,no diarrhea but feels there is a blockage. States lots of people from hospital calling to make appointments ,to much very confusing. Nurse will try to organize and make clear for him and call him with appointments after she talks with cancer center ,client seemed relived. Ask if family members can help him with appointments if needed he states yes and can pick up his medications for him . Discussed seeking care tomorrow on medical mobile [facility] to get inhaler and get appointment on schedule for [facility] ,trnasportation arranged as client doesn't feel he can walk that distance.. Made contact with [facility] to determine status of admission still on hold . Informed client hotel extension made for another week ,he was relieved.Will follow up with Cancer center SW ,and family members. Check on tomorrow after visit to mobile [facility] !

## 2021-06-04 ENCOUNTER — Ambulatory Visit: Payer: Medicaid Other | Admitting: Physician Assistant

## 2021-06-04 ENCOUNTER — Telehealth: Payer: Self-pay

## 2021-06-04 ENCOUNTER — Other Ambulatory Visit: Payer: Self-pay

## 2021-06-04 ENCOUNTER — Telehealth (HOSPITAL_COMMUNITY): Payer: Self-pay | Admitting: Licensed Clinical Social Worker

## 2021-06-04 ENCOUNTER — Other Ambulatory Visit (HOSPITAL_COMMUNITY): Payer: Self-pay

## 2021-06-04 VITALS — BP 158/120 | HR 108 | Temp 98.2°F | Resp 18 | Ht 70.5 in | Wt 164.0 lb

## 2021-06-04 DIAGNOSIS — I5043 Acute on chronic combined systolic (congestive) and diastolic (congestive) heart failure: Secondary | ICD-10-CM

## 2021-06-04 DIAGNOSIS — K120 Recurrent oral aphthae: Secondary | ICD-10-CM

## 2021-06-04 DIAGNOSIS — R0981 Nasal congestion: Secondary | ICD-10-CM

## 2021-06-04 DIAGNOSIS — Z515 Encounter for palliative care: Secondary | ICD-10-CM

## 2021-06-04 DIAGNOSIS — R0600 Dyspnea, unspecified: Secondary | ICD-10-CM

## 2021-06-04 DIAGNOSIS — Z720 Tobacco use: Secondary | ICD-10-CM

## 2021-06-04 DIAGNOSIS — C163 Malignant neoplasm of pyloric antrum: Secondary | ICD-10-CM

## 2021-06-04 LAB — POC COVID19 BINAXNOW: SARS Coronavirus 2 Ag: NEGATIVE

## 2021-06-04 MED ORDER — DEXAMETHASONE 0.5 MG/5ML PO ELIX
0.5000 mg | ORAL_SOLUTION | Freq: Three times a day (TID) | ORAL | 0 refills | Status: DC
Start: 2021-06-04 — End: 2021-06-04
  Filled 2021-06-04: qty 100, 7d supply, fill #0

## 2021-06-04 MED ORDER — DEXAMETHASONE 0.5 MG/5ML PO ELIX: 0.5000 mg | ORAL_SOLUTION | Freq: Three times a day (TID) | ORAL | 0 refills | Status: DC

## 2021-06-04 MED ORDER — ALBUTEROL SULFATE HFA 108 (90 BASE) MCG/ACT IN AERS
2.0000 | INHALATION_SPRAY | RESPIRATORY_TRACT | 1 refills | Status: DC | PRN
  Filled 2021-06-04: qty 8.5, 17d supply, fill #0

## 2021-06-04 MED ORDER — ALBUTEROL SULFATE HFA 108 (90 BASE) MCG/ACT IN AERS: 2.0000 | INHALATION_SPRAY | RESPIRATORY_TRACT | 1 refills | Status: DC | PRN

## 2021-06-04 NOTE — Telephone Encounter (Signed)
TC from client with concerns about where he was being transported,nurse checked address ,transport is wrong ,called transportation services  to correct ,another ride arranged ,client at ease . Will be going to [facility] @[address] [place] not [place] to be seen on Medical mobile [place]. Nurse made sure client was picked up and transported to correct site safely . Several calls made back and forth.to ease clients mind and correct destination.

## 2021-06-04 NOTE — Telephone Encounter (Signed)
Client made it to his destination this am after a rough drive to the wrong site. States he was seen on the mobile [facility] and given scripts that were called into his pharmacy ,inhaler and mouth wash. States he is back at the hotel doing okay. Client was very short of breath but okay. Reminded to get sister to pick up med's she is on her way to see him.Informed him that  I had spoken to her about his appointments on 06-11-2021 and she would transport him. Will remind them next week .Mentioned we would take his appointments week by week not to overload him,he was relieved .Explained appointments for next week Heart failure follow up and for endoscopy . Made a list of appointments and times for client and will drop off . Follow up weekly!

## 2021-06-04 NOTE — Patient Instructions (Signed)
For your canker sores, you will use the prescription rinse 3 times a day, you will swish and spit.  I refilled your inhaler for you.  Please let us know if there is anything else we can do for you.  Kennieth Rad, PA-C Physician Assistant [facility] [email or web address]    Canker Sores  Canker sores are small, painful sores that develop inside your mouth. You can get one or more canker sores on the inside of your lips or cheeks, on your tongue, or anywhere inside your mouth. Canker sores cannot be passed from person to person (are not contagious). These sores are different from the sores that you may get on the outside of your lips (cold sores or fever blisters). What are the causes? The cause of this condition is not known. The condition may be passed down from a parent (genetic). What increases the risk? This condition is more likely to develop in:  Women.  People in their teens or 46s.  Women who are having their menstrual period.  People who are under a lot of emotional stress.  People who do not get enough iron or B vitamins.  People who do not take care of their mouth and teeth (have poor oral hygiene).  People who have an injury inside the mouth, such as after having dental work or from chewing something hard. What are the signs or symptoms? Canker sores usually start as painful red bumps. Then they turn into small white, yellow, or gray sores that have red borders. The sores may be painful, and the pain may get worse when you eat or drink. Along with the canker sore, symptoms may also include:  Fever.  Fatigue.  Swollen lymph nodes in your neck. How is this diagnosed? This condition may be diagnosed based on your symptoms and an exam of the inside of your mouth. If you get canker sores often or if they are very bad, you may have tests, such as:  Blood tests to rule out possible causes.  Swabbing a fluid  sample from the sore to be tested for infection.  Removing a small tissue sample from the sore (biopsy) to test it for cancer. How is this treated? Most canker sores go away without treatment in about 1 week. Home care is usually the only treatment that you will need. Over-the-counter medicines can relieve discomfort. If you have severe canker sores, your health care provider may prescribe:  Numbing ointment to relieve pain. ? Do not use numbing gels or products containing benzocaine in children who are 18 years of age or younger.  Vitamins.  Steroid medicines. These may be given as pills, mouth rinses, or gels.  Antibiotic mouth rinse. Follow these instructions at home:  Apply, take, or use over-the-counter and prescription medicines only as told by your health care provider. These include vitamins and ointments.  If you were prescribed an antibiotic mouth rinse, use it as told by your health care provider. Do not stop using the antibiotic even if your condition improves.  Until the sores are healed: ? Do not drink coffee or citrus juices. ? Do not eat spicy or salty foods.  Use a mild, over-the-counter mouth rinse as recommended by your health care provider.  Practice good oral hygiene by: ? Flossing your teeth every day. ? Brushing your teeth with a soft toothbrush twice each day.   Contact a health care provider if:  Your symptoms do not get better after 2 weeks.  You  also have a fever or swollen glands in your neck.  You get canker sores often.  You have a canker sore that is getting larger.  You cannot eat or drink due to your canker sores. Summary  Canker sores are small, painful sores that develop inside your mouth.  Canker sores usually start as painful red bumps that turn into small white, yellow, or gray sores that have red borders.  The sores may be quite painful, and the pain may get worse when you eat or drink.  Most canker sores clear up without treatment  in about 1 week. Over-the-counter medicines can relieve discomfort. This information is not intended to replace advice given to you by your health care provider. Make sure you discuss any questions you have with your health care provider. Document Revised: 09/06/2020 Document Reviewed: 09/06/2020 Elsevier Patient Education  2021 [employer].

## 2021-06-04 NOTE — Progress Notes (Signed)
Patient reports SOB intermittently for 1 year with a flare up for the past 10 minutes. Patient reports when he has to relieve hisself the SOB increases. Patient has eaten today. Patient has taken medication today. Patient complains of pain in his stomach for "some time"

## 2021-06-04 NOTE — Telephone Encounter (Signed)
SW and nurse coordinating appointments and care for client. SW will send list of upcoming appointments and phone  number for clients other sister Tyler Wong. Client will need someone with him for upcoming visit  for clip placement at site for gastric cancer.treatments.   Someone has to be with client and sign him in. Will make sister aware of this .

## 2021-06-04 NOTE — Telephone Encounter (Addendum)
CSW spoke with Mariann Laster, RN case manager with [employer], and provided with current list of pt appts so she can assist in making sure pt is able to get to and from appts.   CSW unable to set up transport through [employer] at this time for upcoming clinic appt as it is unclear if pt will still be in the hotel or will be at [facility].  Mariann Laster is familiar with [employer] and will assist pt with this as needed  CSW also spoke with [facility] endo about upcoming EGD next week- they will require that responsible party sign him in so will need family member to bring if possible- CSW updated Mariann Laster- she is coordinating this with pt sister  Will continue to follow and assist as needed  [name], [employer] Worker [facility] Desk#: [phone] Cell#: [phone]

## 2021-06-04 NOTE — Telephone Encounter (Signed)
Introduces self and purpose of call to assure appointment for client on 06-11-21 at [facility].Client has 2 appointments that day [facility] at 11:30 am and EDG at 1:15pm . Sister will provide transport for that day. Sister willing to pick up medications today if needed and will take client clean clothes and food.Willing to help with his care as much as possible. Thanked the CN program for helping out!

## 2021-06-04 NOTE — Progress Notes (Signed)
Established Patient Office Visit  Subjective:  Patient ID: Tyler Wong, male    DOB: [birth year]  Age: 55 y.o. MRN: [id number]  CC:  Chief Complaint  Patient presents with  . Shortness of Breath    HPI Tyler Wong reports that he has been having shortness of breath, states that this is not new, states that he is currently being treated for stomach cancer and also is experiencing heart failure.  Reports that he has chronic shortness of breath.  Reports that he will have times when it becomes worse, this is mainly when he is experiencing the need for a bowel movement, states that his stomach will become distended and makes it difficult for him to breathe.  Has previously used an inhaler on occasion with some relief.  Reports that he has been taking MiraLAX and using a probiotic and it does offer some relief from his stomach distention and discomfort from bowel movements.  Denies any new bilateral lower edema, does endorse that he eats a low-sodium diet.  Complains of 2 canker sores under his tongue, states they have been present for the past week.  States that they are painful and make it difficult for him to eat or drink.  Has not tried anything for relief    Past Medical History:  Diagnosis Date  . CAD in native artery 07/17/14   STEMI- LAD stenosis with DES  . CHF (congestive heart failure) ([place])   . Cocaine abuse ([place])   . COPD (chronic obstructive pulmonary disease) ([place])   . Dyspnea   . Gastric cancer ([place]) 11/01/2020  . GERD (gastroesophageal reflux disease)   . Hypertension   . ST elevation myocardial infarction (STEMI) involving left anterior descending (LAD) coronary artery with complication ([place])   . Tobacco abuse 07/17/2014    Past Surgical History:  Procedure Laterality Date  . BIOPSY  11/05/2020   Procedure: BIOPSY;  Surgeon: Wilford Corner, MD;  Location: [place];  Service: Endoscopy;;  . CORONARY ANGIOPLASTY WITH STENT PLACEMENT  07/18/14   resolute DES  to LAD STEMI  . ESOPHAGOGASTRODUODENOSCOPY N/A 11/05/2020   Procedure: ESOPHAGOGASTRODUODENOSCOPY (EGD);  Surgeon: Wilford Corner, MD;  Location: [place];  Service: Endoscopy;  Laterality: N/A;  . IR IMAGING GUIDED PORT INSERTION  12/06/2020  . LEFT HEART CATH N/A 07/17/2014   Procedure: LEFT HEART CATH;  Surgeon: [name], MD;  Location: [facility] CATH LAB;  Service: Cardiovascular;  Laterality: N/A;  . PERCUTANEOUS CORONARY STENT INTERVENTION (PCI-S)  07/17/2014   Procedure: PERCUTANEOUS CORONARY STENT INTERVENTION (PCI-S);  Surgeon: [name], MD;  Location: [facility] CATH LAB;  Service: Cardiovascular;;  . RIGHT/LEFT HEART CATH AND CORONARY ANGIOGRAPHY N/A 10/03/2018   Procedure: RIGHT/LEFT HEART CATH AND CORONARY ANGIOGRAPHY;  Surgeon: [name], MD;  Location: [place] CV LAB;  Service: Cardiovascular;  Laterality: N/A;    Family History  Problem Relation Age of Onset  . Cirrhosis Father   . Heart attack Mother   . Heart attack Sister   . Heart failure Sister   . Heart attack Brother     Social History   Socioeconomic History  . Marital status: Single    Spouse name: Not on file  . Number of children: 2  . Years of education: 45  . Highest education level: 12th grade  Occupational History  . Occupation: Landscaping  Tobacco Use  . Smoking status: Current Every Day Smoker    Packs/day: 0.25    Years: 30.00    Pack  years: 7.50    Types: Cigarettes  . Smokeless tobacco: Never Used  . Tobacco comment: .5 PK PER DAY  Vaping Use  . Vaping Use: Never used  Substance and Sexual Activity  . Alcohol use: No    Comment: Patient denies abuse, states social drinker  . Drug use: Yes    Types: Cocaine, "Crack" cocaine    Comment: heroin  . Sexual activity: Yes  Other Topics Concern  . Not on file  Social History Narrative   Lives in [place] with his sister and girlfriend. He works Aeronautical engineer work.    Social Determinants of Health   Financial  Resource Strain: High Risk  . Difficulty of Paying Living Expenses: Very hard  Food Insecurity: Food Insecurity Present  . Worried About Charity fundraiser in the Last Year: Sometimes true  . Ran Out of Food in the Last Year: Sometimes true  Transportation Needs: No Transportation Needs  . Lack of Transportation (Medical): No  . Lack of Transportation (Non-Medical): No  Physical Activity: Not on file  Stress: Not on file  Social Connections: Not on file  Intimate Partner Violence: Not At Risk  . Fear of Current or Ex-Partner: No  . Emotionally Abused: No  . Physically Abused: No  . Sexually Abused: No    Outpatient Medications Prior to Visit  Medication Sig Dispense Refill  . acidophilus (RISAQUAD) CAPS capsule Take 1 capsule by mouth daily.    . ASPIRIN LOW DOSE 81 MG EC tablet TAKE 1 TABLET (81 MG TOTAL) BY MOUTH DAILY (MORNING) (Patient taking differently: Take 81 mg by mouth in the morning.) 30 tablet 11  . atorvastatin (LIPITOR) 80 MG tablet Take 1 tablet (80 mg total) by mouth daily. 30 tablet 11  . empagliflozin (JARDIANCE) 10 MG TABS tablet Take 1 tablet (10 mg total) by mouth daily. 30 tablet 11  . gabapentin (NEURONTIN) 100 MG capsule Take 1 capsule (100 mg total) by mouth 2 (two) times daily. 60 capsule 0  . ivabradine (CORLANOR) 5 MG TABS tablet Take 1 tablet (5 mg total) by mouth 2 (two) times daily with a meal. 60 tablet 11  . nicotine (NICODERM CQ - DOSED IN MG/24 HR) 7 mg/24hr patch Place 1 patch (7 mg total) onto the skin daily. 28 patch 0  . pantoprazole (PROTONIX) 40 MG tablet Take 1 tablet (40 mg total) by mouth 2 (two) times daily. 60 tablet 2  . polyethylene glycol (MIRALAX / GLYCOLAX) 17 g packet Take 17 g by mouth 2 (two) times daily. 14 each 1  . potassium chloride SA (KLOR-CON) 20 MEQ tablet Take 2 tablets (40 mEq total) by mouth daily. 60 tablet 11  . torsemide 40 MG TABS Take 80 mg by mouth daily. 30 tablet 1  . albuterol (VENTOLIN HFA) 108 (90 Base)  MCG/ACT inhaler Inhale 2 puffs into the lungs every 4 (four) hours as needed for wheezing or shortness of breath. 6.7 g 1   No facility-administered medications prior to visit.    No Known Allergies  ROS Review of Systems  Constitutional: Negative for chills and fever.  HENT: Positive for dental problem and mouth sores. Negative for congestion.   Eyes: Negative.   Respiratory: Positive for shortness of breath. Negative for cough and wheezing.   Cardiovascular: Negative for chest pain and palpitations.  Gastrointestinal: Positive for abdominal pain.  Endocrine: Negative.   Genitourinary: Negative.   Musculoskeletal: Negative.   Skin: Negative.   Allergic/Immunologic: Negative.  Neurological: Negative.   Hematological: Negative.   Psychiatric/Behavioral: Negative.       Objective:    Physical Exam Vitals and nursing note reviewed.  Constitutional:      General: He is not in acute distress.    Appearance: He is ill-appearing.     Comments: Very thin  HENT:     Head: Normocephalic and atraumatic.     Right Ear: External ear normal.     Left Ear: External ear normal.     Nose: Nose normal.     Mouth/Throat:     Lips: Pink.     Mouth: Mucous membranes are moist.     Dentition: Abnormal dentition.     Tongue: Lesions present.     Comments: Two white small shallow lesion under tongue Eyes:     Extraocular Movements: Extraocular movements intact.     Conjunctiva/sclera: Conjunctivae normal.     Pupils: Pupils are equal, round, and reactive to light.  Cardiovascular:     Rate and Rhythm: Normal rate and regular rhythm.     Pulses: Normal pulses.     Heart sounds: Normal heart sounds.  Pulmonary:     Effort: Pulmonary effort is normal.     Breath sounds: Normal breath sounds. No wheezing.  Musculoskeletal:        General: Normal range of motion.     Cervical back: Normal range of motion and neck supple.  Skin:    General: Skin is warm and dry.  Neurological:      General: No focal deficit present.     Mental Status: He is alert and oriented to person, place, and time.  Psychiatric:        Mood and Affect: Mood normal.        Behavior: Behavior normal.        Thought Content: Thought content normal.        Judgment: Judgment normal.     BP (!) 158/120 (BP Location: Left Arm, Patient Position: Sitting, Cuff Size: Normal)   Pulse (!) 108   Temp 98.2 F (36.8 C) (Oral)   Resp 18   Ht 5' 10.5" (1.791 m)   Wt 164 lb (74.4 kg)   SpO2 100%   BMI 23.20 kg/m  Wt Readings from Last 3 Encounters:  06/04/21 164 lb (74.4 kg)  05/22/21 157 lb 6.4 oz (71.4 kg)  05/21/21 152 lb 9.6 oz (69.2 kg)     Health Maintenance Due  Topic Date Due  . Pneumococcal Vaccine 52-61 Years old (1 of 4 - PCV13) Never done  . COVID-19 Vaccine (1) Never done  . COLONOSCOPY (Pts 45-10yrs Insurance coverage will need to be confirmed)  Never done  . Zoster Vaccines- Shingrix (1 of 2) Never done    There are no preventive care reminders to display for this patient.  Lab Results  Component Value Date   TSH 0.443 10/03/2020   Lab Results  Component Value Date   WBC 11.6 (H) 05/22/2021   HGB 13.7 05/22/2021   HCT 42.0 05/22/2021   MCV 85.7 05/22/2021   PLT 250 05/22/2021   Lab Results  Component Value Date   NA 130 (L) 05/21/2021   K 3.9 05/21/2021   CO2 27 05/21/2021   GLUCOSE 164 (H) 05/21/2021   BUN 53 (H) 05/21/2021   CREATININE 1.66 (H) 05/21/2021   BILITOT 1.0 05/20/2021   ALKPHOS 131 (H) 05/20/2021   AST 22 05/20/2021   ALT 60 (H) 05/20/2021   PROT  6.8 05/20/2021   ALBUMIN 3.0 (L) 05/20/2021   CALCIUM 8.9 05/21/2021   ANIONGAP 7 05/21/2021   Lab Results  Component Value Date   CHOL 145 01/20/2021   Lab Results  Component Value Date   HDL 61 01/20/2021   Lab Results  Component Value Date   LDLCALC 56 01/20/2021   Lab Results  Component Value Date   TRIG 140 01/20/2021   Lab Results  Component Value Date   CHOLHDL 2.4 01/20/2021    Lab Results  Component Value Date   HGBA1C 6.5 (H) 04/12/2020      Assessment & Plan:   Problem List Items Addressed This Visit      Cardiovascular and Mediastinum   Acute on chronic combined systolic (congestive) and diastolic (congestive) heart failure (HCC)     Digestive   Gastric cancer (HCC)   Relevant Medications   dexamethasone 0.5 MG/5ML elixir     Other   Tobacco abuse   Relevant Medications   albuterol (VENTOLIN HFA) 108 (90 Base) MCG/ACT inhaler   Palliative care by specialist   Acute dyspnea - Primary    Other Visit Diagnoses    Aphthous stomatitis       Relevant Medications   dexamethasone 0.5 MG/5ML elixir   Nasal congestion       Relevant Orders   POC COVID-19    1. Acute dyspnea Refill of albuterol inhaler.  Patient was experiencing episodes of shortness of breath during office visit, oxygen level was 100%  Red Flags given for prompt reevaluation  - albuterol (VENTOLIN HFA) 108 (90 Base) MCG/ACT inhaler; Inhale 2 puffs into the lungs every 4 (four) hours as needed for wheezing or shortness of breath.  Dispense: 8.5 g; Refill: 1   2. Aphthous stomatitis  - dexamethasone 0.5 MG/5ML elixir; Take 5 mLs (0.5 mg total) by mouth 3 (three) times daily. Swish and spit  Dispense: 100 mL; Refill: 0  3. Acute on chronic combined systolic (congestive) and diastolic (congestive) heart failure (HCC) Repeat pulse WNL  4. Nasal congestion Rapid COVID testing negative - POC COVID-19  5. 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 Palliative care by specialist   7. Tobacco abuse   I have reviewed the patient's medical history (PMH, PSH, Social History, Family History, Medications, and allergies) , and have been updated if relevant. I spent 31 minutes reviewing chart and  face to face time with patient.    Meds ordered this encounter  Medications  . DISCONTD: albuterol (VENTOLIN HFA) 108 (90 Base) MCG/ACT inhaler    Sig: Inhale 2 puffs into the  lungs every 4 (four) hours as needed for wheezing or shortness of breath.    Dispense:  8.5 g    Refill:  [place]    Order Specific Question:   Supervising Provider    Answer:   Asencion Noble E [[id number]]  . DISCONTD: dexamethasone 0.5 MG/5ML elixir    Sig: Take 5 mLs (0.5 mg total) by mouth 3 (three) times daily. Swish and spit    Dispense:  100 mL    Refill:  [place]    Order Specific Question:   Supervising Provider    Answer:   Asencion Noble E [[id number]]  . albuterol (VENTOLIN HFA) 108 (90 Base) MCG/ACT inhaler    Sig: Inhale 2 puffs into the lungs every 4 (four) hours as needed for wheezing or shortness of breath.  Dispense:  8.5 g    Refill:  1    Order Specific Question:   Supervising Provider    Answer:   Asencion Noble E [[id number]]  . dexamethasone 0.5 MG/5ML elixir    Sig: Take 5 mLs (0.5 mg total) by mouth 3 (three) times daily. Swish and spit    Dispense:  100 mL    Refill:  0    Order Specific Question:   Supervising Provider    Answer:   Elsie Stain [[id number]]    Follow-up: Return if symptoms worsen or fail to improve.    Loraine Grip Mayers, PA-C

## 2021-06-06 NOTE — Progress Notes (Signed)
Attempted to obtain medical history via telephone, unable to reach at this time. I left a voicemail to return pre surgical testing department's phone call.  

## 2021-06-10 ENCOUNTER — Encounter: Payer: Self-pay | Admitting: *Deleted

## 2021-06-10 ENCOUNTER — Telehealth: Payer: Self-pay | Admitting: Oncology

## 2021-06-10 ENCOUNTER — Other Ambulatory Visit: Payer: Self-pay | Admitting: *Deleted

## 2021-06-10 DIAGNOSIS — C163 Malignant neoplasm of pyloric antrum: Secondary | ICD-10-CM

## 2021-06-10 NOTE — Telephone Encounter (Signed)
I called and spoke with patient regarding appointments scheduled for 6/20 @ [facility] advised him of instructions for CT scan that will be same day @ 1:30.  He voiced understanding of this information.

## 2021-06-10 NOTE — Progress Notes (Signed)
Per Dr. Benay Spice: Needs CT abd/pelvis prior to beginning RT and an OV to review tx plan again and dose his Xeloda. Scheduled CT scan for 06/16/21 at 1:15/1:30 and sent scheduling message to see Dr. Benay Spice on 6/20 at [facility] and labs afterwards. He will be instructed by scheduling to eat breakfast due to being NPO after 0930 that morning. Also request his transportation be confirmed.

## 2021-06-11 ENCOUNTER — Ambulatory Visit (HOSPITAL_COMMUNITY): Admission: RE | Admit: 2021-06-11 | Payer: Medicaid Other | Source: Home / Self Care | Admitting: Gastroenterology

## 2021-06-11 ENCOUNTER — Ambulatory Visit (HOSPITAL_COMMUNITY)
Admit: 2021-06-11 | Discharge: 2021-06-11 | Disposition: A | Discharge status: Home / Self Care | Payer: Medicaid Other | Attending: Family Medicine | Admitting: Family Medicine

## 2021-06-11 ENCOUNTER — Encounter (HOSPITAL_COMMUNITY): Payer: Self-pay

## 2021-06-11 ENCOUNTER — Encounter (HOSPITAL_COMMUNITY): Payer: Self-pay | Admitting: Certified Registered"

## 2021-06-11 ENCOUNTER — Telehealth: Payer: Self-pay

## 2021-06-11 ENCOUNTER — Encounter: Payer: Self-pay | Admitting: Nutrition

## 2021-06-11 ENCOUNTER — Telehealth (HOSPITAL_COMMUNITY): Payer: Self-pay | Admitting: Licensed Clinical Social Worker

## 2021-06-11 ENCOUNTER — Encounter (HOSPITAL_COMMUNITY): Admission: RE | Payer: Self-pay | Source: Home / Self Care

## 2021-06-11 ENCOUNTER — Other Ambulatory Visit: Payer: Self-pay

## 2021-06-11 VITALS — BP 90/70 | HR 98 | Wt 160.2 lb

## 2021-06-11 DIAGNOSIS — Z7982 Long term (current) use of aspirin: Secondary | ICD-10-CM | POA: Diagnosis not present

## 2021-06-11 DIAGNOSIS — Z01818 Encounter for other preprocedural examination: Secondary | ICD-10-CM | POA: Insufficient documentation

## 2021-06-11 DIAGNOSIS — Z79899 Other long term (current) drug therapy: Secondary | ICD-10-CM | POA: Insufficient documentation

## 2021-06-11 DIAGNOSIS — Z7984 Long term (current) use of oral hypoglycemic drugs: Secondary | ICD-10-CM | POA: Insufficient documentation

## 2021-06-11 DIAGNOSIS — I509 Heart failure, unspecified: Secondary | ICD-10-CM | POA: Diagnosis not present

## 2021-06-11 DIAGNOSIS — I2510 Atherosclerotic heart disease of native coronary artery without angina pectoris: Secondary | ICD-10-CM

## 2021-06-11 DIAGNOSIS — C169 Malignant neoplasm of stomach, unspecified: Secondary | ICD-10-CM | POA: Insufficient documentation

## 2021-06-11 DIAGNOSIS — Z955 Presence of coronary angioplasty implant and graft: Secondary | ICD-10-CM | POA: Insufficient documentation

## 2021-06-11 DIAGNOSIS — Z9861 Coronary angioplasty status: Secondary | ICD-10-CM

## 2021-06-11 DIAGNOSIS — J449 Chronic obstructive pulmonary disease, unspecified: Secondary | ICD-10-CM | POA: Diagnosis not present

## 2021-06-11 DIAGNOSIS — I252 Old myocardial infarction: Secondary | ICD-10-CM | POA: Insufficient documentation

## 2021-06-11 DIAGNOSIS — I5022 Chronic systolic (congestive) heart failure: Secondary | ICD-10-CM

## 2021-06-11 DIAGNOSIS — I10 Essential (primary) hypertension: Secondary | ICD-10-CM | POA: Diagnosis not present

## 2021-06-11 DIAGNOSIS — N1831 Chronic kidney disease, stage 3a: Secondary | ICD-10-CM | POA: Diagnosis not present

## 2021-06-11 DIAGNOSIS — F1410 Cocaine abuse, uncomplicated: Secondary | ICD-10-CM

## 2021-06-11 LAB — BASIC METABOLIC PANEL
Anion gap: 11 (ref 5–15)
BUN: 44 mg/dL — ABNORMAL HIGH (ref 6–20)
CO2: 23 mmol/L (ref 22–32)
Calcium: 9.4 mg/dL (ref 8.9–10.3)
Chloride: 97 mmol/L — ABNORMAL LOW (ref 98–111)
Creatinine, Ser: 2.19 mg/dL — ABNORMAL HIGH (ref 0.61–1.24)
GFR, Estimated: 35 mL/min — ABNORMAL LOW (ref >60)
Glucose, Bld: 92 mg/dL (ref 70–99)
Potassium: 5.2 mmol/L — ABNORMAL HIGH (ref 3.5–5.1)
Sodium: 131 mmol/L — ABNORMAL LOW (ref 135–145)

## 2021-06-11 LAB — DIGOXIN LEVEL: Digoxin Level: 1.1 ng/mL (ref 0.8–2.0)

## 2021-06-11 SURGERY — ESOPHAGOGASTRODUODENOSCOPY (EGD) WITH PROPOFOL
Anesthesia: Monitor Anesthesia Care

## 2021-06-11 MED ORDER — TORSEMIDE 40 MG PO TABS: ORAL_TABLET | ORAL | 1 refills | Status: DC

## 2021-06-11 NOTE — Telephone Encounter (Signed)
Transportation for 06-26-2021 arranged and list of all appointments and pick up times completed for client ,emailed sister of schedule and reminded of appointmnets

## 2021-06-11 NOTE — Telephone Encounter (Signed)
Pick up times given to client and discussed bring out food items

## 2021-06-11 NOTE — H&P (Signed)
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 Repeat endoscopy requested, for placement of endoclips at ulcerated malignancy site to assist in radiation therapy.   Current Medications  Taking  Acidophilus - Capsule 1 capsule Orally once a day Albuterol Sulfate HFA 108 (90 Base) MCG/ACT Aerosol Solution 2 puffs as needed Inhalation every 4 hrs Aspirin Low Dose(Aspirin) 81 MG Tablet Delayed Release 1 tablet Orally Once a day Atorvastatin Calcium 80 MG Tablet 1 tablet Orally Once a day Colchicine 0.6 MG Tablet 1 tablet Orally once a day Corlanor(Ivabradine HCl) 5 MG Tablet 1 tablet with meals Orally Twice a day Gabapentin 100 MG Capsule 1 capsule Orally Twice a day Jardiance(Empagliflozin) 10 MG Tablet 1 tablet Orally Once a day MiraLax(Polyethylene Glycol 3350) 17 GM Packet 1 packet mixed with 8 ounces of fluid Orally twice a day Pantoprazole Sodium 40 MG Tablet Delayed Release 1 tablet Orally twice a day Potassium Chloride Crys ER 20 MEQ Tablet Extended Release 2 tablets Orally Once a day predniSONE 20 MG Tablet 2 tablets (40mg ) Orally Once a day Torsemide 40 MG Tablet 2 tablets Orally daily       Medication List reviewed and reconciled with the patient   Past Medical History       CAD/CHF      Cocaine abuse.      COPD.      Gastric cancer.       GERD.      Hypertension.      Heart attack.      Diabetes.      Chronic kidney disease.   Surgical History        Heart catherization 2015,2019        EGD 2021        Port insertion 2021       Family History  Father: deceased, cirrhosis  Mother: deceased, Heart attack       Social History  General:   Tobacco use       cigarettes:  Current smoker     Tobacco history last updated  05/22/2021 Alcohol: yes, Rare.  Recreational drug use: yes, cocaine but not recently:"crack" cocaine.      Allergies  N.K.D.A.        Hospitalization/Major Diagnostic Procedure  fluid in lungs 02/2021  [id number] pain 04/2021     Review of Systems  GI PROCEDURE:          Pacemaker/ AICD no.  Artificial heart valves no.  MI/heart attack YES Stent placed.  Abnormal heart rhythm YES.  Angina YES.  CVA YES 2.  Hypertension YES.  Hypotension no.  Asthma, COPD YES.  Sleep apnea no.  Seizure disorders no.  Artificial joints no.  Severe DJD no.  Diabetes YES, type II.  Significant headaches no.  Vertigo no.  Depression/anxiety no.  Abnormal bleeding no.  Kidney Disease YES.  Liver disease no.  Chance of pregnancy no.  Blood transfusion no.       Review of Systems      Patient has completed 6 cycles of chemotherapy and is in clinical remission. His oncologists recommends restaging EGD prior to radiation therapy. CT 2/24/202: previously noted mass of the posterior gastric antrum no longer seen. No lymphadenopathy or metastatic disease in the abdomen or pelvis.        Patient denies any complaints. Denies abdominal pain, changes in stool, diarrhea, melena, or hematochezia. Notes occasional constipation. Further denies nausea, vomiting, dysphagia,  GERD, changes in appetite, early satiety, and unexplained weight loss.        He takes 81 mg ASA daily but denies NSAID or blood thinner use. He has never had a colonoscopy. He denies family history of colon cancer or gastrointestinal malignancy.        Hgb 13.1 as of 03/19/21.     Vital Signs  Wt 164, Ht 70, BMI 23.53, Temp 98.1, Pulse sitting 78, BP sitting 96/78.     Examination  Gastroenterology::        GENERAL APPEARANCE: Well developed, well nourished, pleasant, cooperative, no acute distress .         EYES: Lids and conjunctiva normal. Anicteric sclera.         CARDIOVASCULAR Regular rate and rhythm. No murmurs, rubs, or gallops auscultated..         RESPIRATORY Clear to auscultation bilaterally. Breathing comfortably on room air..         ABDOMEN Soft, nontender, nondistended.  Normoactive bowel sounds. No masses palpated.         EXTREMITIES: No edema, radial pulses intact .         NEURO: Alert and oriented.         PSYCH: Normal mood and affect.     Treatment   1. Gastric cancer         IMAGING: EGD    Notes: Repeat EGD for restaging of gastric cancer. Due to CHF with EF 20-25%, proceed in the hospital setting. I thoroughly discussed the procedure with the patient to include nature, alternatives, benefits, and risks (including but not limited to bleeding, infection, perforation, missing a polyp or lesion, and anesthesia/cardiac and pulmonary complications). Patient verbalized understanding and gave verbal consent to proceed with EGD.

## 2021-06-11 NOTE — Patient Instructions (Signed)
STOP Digoxin INCREASE Torsemide to 80mg  in the AM and 40 mg in the PM  Labs today We will only contact you if something comes back abnormal or we need to make some changes. Otherwise no news is good news!  Labs needed in 7-10 days  Your physician recommends that you schedule a follow-up appointment in: 2 weeks  in the Advanced Practitioners (PA/NP) Clinic    Do the following things EVERYDAY: Weigh yourself in the morning before breakfast. Write it down and keep it in a log. Take your medicines as prescribed Eat low salt foods--Limit salt (sodium) to 2000 mg per day.  Stay as active as you can everyday Limit all fluids for the day to less than 2 liters   At the [facility], you and your health needs are our priority. As part of our continuing mission to provide you with exceptional heart care, we have created designated Provider Care Teams. These Care Teams include your primary Cardiologist (physician) and Advanced Practice Providers (APPs- Physician Assistants and Nurse Practitioners) who all work together to provide you with the care you need, when you need it.   You may see any of the following providers on your designated Care Team at your next follow up: Dr Glori Bickers Dr Loralie Champagne Dr Patrice Paradise, NP Lyda Jester, [place] Ginnie Smart Audry Riles, PharmD   Please be sure to bring in all your medications bottles to every appointment.

## 2021-06-11 NOTE — [facility]regational Nurse Program (Signed)
  Dept: [facility] Nurse Program Note  Date of Encounter: 06/11/2021  Past Medical History: Past Medical History:  Diagnosis Date   CAD in native artery 07/17/14   STEMI- LAD stenosis with DES   CHF (congestive heart failure) (HCC)    Cocaine abuse (HCC)    COPD (chronic obstructive pulmonary disease) (HCC)    Dyspnea    Gastric cancer ([place]) 11/01/2020   GERD (gastroesophageal reflux disease)    Hypertension    ST elevation myocardial infarction (STEMI) involving left anterior descending (LAD) coronary artery with complication ([place])    Tobacco abuse 07/17/2014    Encounter Details:  CNP Questionnaire - 06/10/21 2236       Questionnaire   Do you give verbal consent to treat you today? Yes    Visit Setting Other    Location Patient Served At [facility]    Patient Status Homeless    Medical Provider Yes    Insurance Medicaid    Intervention Counsel;Educate;Support    Housing/Utilities No permanent housing;Worried about losing housing    Transportation Need transportation assistance;Provided transportation assistance ([facility] transp,bus pass, taxi voucher, etc.);Within past 12 months, lack of transportation negatively impacted life    Food Have food insecurities;Within past 12 months, food ran out with no money to buy more    Referrals PCP - [facility]    ED Visit Averted Yes           Visit to client to deliver food items and check on him. Had made several calls arranging transportation and emailing sister for her assistance. Client was feeling okay ,except complaints of stomach feeling full. Taking his medications ,completed food stamp re certification . Informed client that his stay was extended and that I would let him know way in advance if he has to leave,not to worry about being put out in the heat! Client was very relieved and thanked the nurse for al of the services provided . [facility]  issues placement will be delayed to buildingthat  occurred this week! .  Will check on client after his appointments this week. Client was SOB but appeared to be about the same physically .

## 2021-06-11 NOTE — Telephone Encounter (Signed)
Nurse and [phone] introduced self to each other role of nurse with [employer] program explained . Client seen in [facility] this am doing okay.  Nurse mentioned Mr Klippel called earlier and stated he missed his appointment for EGD because he was suppose to be there 2 hours before appointment time  !  Appointment rescheduled . Nurse will do a record review  and assure appointments with transportation are rearranged and get back with client. Next heart app 06-27-2021 Follow

## 2021-06-11 NOTE — H&P (View-Only) (Signed)
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 Repeat endoscopy requested, for placement of endoclips at ulcerated malignancy site to assist in radiation therapy.   Current Medications  Taking  Acidophilus - Capsule 1 capsule Orally once a day Albuterol Sulfate HFA 108 (90 Base) MCG/ACT Aerosol Solution 2 puffs as needed Inhalation every 4 hrs Aspirin Low Dose(Aspirin) 81 MG Tablet Delayed Release 1 tablet Orally Once a day Atorvastatin Calcium 80 MG Tablet 1 tablet Orally Once a day Colchicine 0.6 MG Tablet 1 tablet Orally once a day Corlanor(Ivabradine HCl) 5 MG Tablet 1 tablet with meals Orally Twice a day Gabapentin 100 MG Capsule 1 capsule Orally Twice a day Jardiance(Empagliflozin) 10 MG Tablet 1 tablet Orally Once a day MiraLax(Polyethylene Glycol 3350) 17 GM Packet 1 packet mixed with 8 ounces of fluid Orally twice a day Pantoprazole Sodium 40 MG Tablet Delayed Release 1 tablet Orally twice a day Potassium Chloride Crys ER 20 MEQ Tablet Extended Release 2 tablets Orally Once a day predniSONE 20 MG Tablet 2 tablets (40mg ) Orally Once a day Torsemide 40 MG Tablet 2 tablets Orally daily       Medication List reviewed and reconciled with the patient   Past Medical History       CAD/CHF      Cocaine abuse.      COPD.      Gastric cancer.       GERD.      Hypertension.      Heart attack.      Diabetes.      Chronic kidney disease.   Surgical History        Heart catherization 2015,2019        EGD 2021        Port insertion 2021       Family History  Father: deceased, cirrhosis  Mother: deceased, Heart attack       Social History  General:   Tobacco use       cigarettes:  Current smoker     Tobacco history last updated  05/22/2021 Alcohol: yes, Rare.  Recreational drug use: yes, cocaine but not recently:"crack" cocaine.      Allergies  N.K.D.A.        Hospitalization/Major Diagnostic Procedure  fluid in lungs 02/2021  [id number] pain 04/2021     Review of Systems  GI PROCEDURE:          Pacemaker/ AICD no.  Artificial heart valves no.  MI/heart attack YES Stent placed.  Abnormal heart rhythm YES.  Angina YES.  CVA YES 2.  Hypertension YES.  Hypotension no.  Asthma, COPD YES.  Sleep apnea no.  Seizure disorders no.  Artificial joints no.  Severe DJD no.  Diabetes YES, type II.  Significant headaches no.  Vertigo no.  Depression/anxiety no.  Abnormal bleeding no.  Kidney Disease YES.  Liver disease no.  Chance of pregnancy no.  Blood transfusion no.       Review of Systems      Patient has completed 6 cycles of chemotherapy and is in clinical remission. His oncologists recommends restaging EGD prior to radiation therapy. CT 2/24/202: previously noted mass of the posterior gastric antrum no longer seen. No lymphadenopathy or metastatic disease in the abdomen or pelvis.        Patient denies any complaints. Denies abdominal pain, changes in stool, diarrhea, melena, or hematochezia. Notes occasional constipation. Further denies nausea, vomiting, dysphagia,  GERD, changes in appetite, early satiety, and unexplained weight loss.        He takes 81 mg ASA daily but denies NSAID or blood thinner use. He has never had a colonoscopy. He denies family history of colon cancer or gastrointestinal malignancy.        Hgb 13.1 as of 03/19/21.     Vital Signs  Wt 164, Ht 70, BMI 23.53, Temp 98.1, Pulse sitting 78, BP sitting 96/78.     Examination  Gastroenterology::        GENERAL APPEARANCE: Well developed, well nourished, pleasant, cooperative, no acute distress .         EYES: Lids and conjunctiva normal. Anicteric sclera.         CARDIOVASCULAR Regular rate and rhythm. No murmurs, rubs, or gallops auscultated..         RESPIRATORY Clear to auscultation bilaterally. Breathing comfortably on room air..         ABDOMEN Soft, nontender, nondistended.  Normoactive bowel sounds. No masses palpated.         EXTREMITIES: No edema, radial pulses intact .         NEURO: Alert and oriented.         PSYCH: Normal mood and affect.     Treatment   1. Gastric cancer         IMAGING: EGD    Notes: Repeat EGD for restaging of gastric cancer. Due to CHF with EF 20-25%, proceed in the hospital setting. I thoroughly discussed the procedure with the patient to include nature, alternatives, benefits, and risks (including but not limited to bleeding, infection, perforation, missing a polyp or lesion, and anesthesia/cardiac and pulmonary complications). Patient verbalized understanding and gave verbal consent to proceed with EGD.

## 2021-06-11 NOTE — Telephone Encounter (Signed)
Nurse called 06-09-2021 to check on client and remind him of appointments for the week ,states he is feeling okay ,not out much due to weather,reminded of appointments-2022 [facility] and [facility]  on 6-15 -2022 at 1 :15 pm  Will need transport there and his sister will meet him and take him to his next appointment Nurse will arrange Ask if he had food states it is low ,nurse will get food from [employer] . States he did apply for food stamps re certification but it may take 10 days to get his card.  Will extend his stay for another week

## 2021-06-11 NOTE — Progress Notes (Signed)
  Station c Ruler 29 Vest reading 35%

## 2021-06-11 NOTE — Progress Notes (Signed)
Provided 1 complementary case of Ensure Plus. 

## 2021-06-11 NOTE — Telephone Encounter (Signed)
CSW contacted [name] at [facility] [phone]  who states she continues to visit patient and monitor health status. Mariann Laster shared that [place] where patient was enrolled in a housing program had a water break and had to move patient to [employer] for temporary shelter until he can return to [employer] for [employer] which will ultimately assist patient with a permanent housing solution. CSW will continue to follow and collaborate with patient and RN. Raquel Sarna, [place], [place]

## 2021-06-11 NOTE — Addendum Note (Signed)
Encounter addended by: Louann Liv, LCSW on: 06/11/2021 12:55 PM  Actions taken: Clinical Note Signed

## 2021-06-11 NOTE — Telephone Encounter (Signed)
Transport arranged for 06-11-2021  Pick up time 11 am  Will let client know  ,transport already in place for fridays appt at 11:00 am for CT stimulation

## 2021-06-11 NOTE — Progress Notes (Signed)
CSW met with patient in the clinic. Patient and family member asked about status of  housing. Patient reports he recently moved to the hotel through [employer]. CSW explained about the [employer] project and asked if he has a Market researcher. Patient states that [name] RN [phone] is his case manager. CSW will contact Mariann Laster and follow up with patient as needed. Patient grateful or the support and provided CSW contact information if needed. Raquel Sarna, [place], [place]

## 2021-06-11 NOTE — Progress Notes (Signed)
Advanced Heart Failure Clinic Note   PCP: [name], NP AHFC: Dr. Aundra Dubin  Oncology: Dr Benay Spice   Reason for Visit:  Heart Failure  HPI: 55 y.o. 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 Also w/ h/o poor compliance. Diagnosed w/ COVID 09/19/20.   Admitted 10/21 w/ acute on chronic systolic heart failure w/ low output and AKI requiring milrinone to help w/ diuresis. He was cocaine positive. Diuresed and weaned off milrinone w/ stable co-ox at 65%. Scr had improved from 2.1>>1.7. D/c wt 159 lb. Unfortunately, his wife was also hospitalized for COVID and died while he was admitted.   Readmitted again 11/21 with marked volume overload and low output heart failure, cocaine+ again. He complained of abdominal pain, initially suspected to be 2/2 low output HF but CT showed a mass-like area in the antral pre-pyloric region of the stomach. EGD with complicated distal stomach ulcers. Pathology confirmed gastric cancer, intramucosal adenocarcinoma. Seen by oncology and not felt to be a candidate for surgery given other comorbidities.   He follows with Dr. Benay Spice and has been getting FOLFOX chemotherapy. Had porta cath placed 10/04/20.   Admitted 12/7508 with A/C systolic heart failure + AKI. + cocaine on UDS. Had low output heart failure and was placed on milrinone and diuresed with IV lasix. Once diuresed he was transitioned to torsemide 80 mg daily. Bidil, entresto, digoxin, and spirononlactone discontinued and not restarted due to AKI.   Today he returns for post hospital follow up with his sister. Complaining of abdominal discomfort and nausea. Says he has ongoing issues with abdominal bloating. Overall feeling fine. SOB with exertion. Denies /PND/Orthopnea. Appetite ok.  No fever or chills. He has not been weighing because he has not scale. Taking all medications and has  been taking digoxin. . Lives in a motel. He is on the list for low income housing.   Labs (1/22): K 3.3, creatinine 1.29, hgb 13.8 Labs (2/22): K 3.8, creatinine 1.37, hgb 13.0 Labs (05/13/21): Creatinine 2.2  Labs (05/21/21): Creatinine 1.7   Review of systems complete and found to be negative unless listed in HPI.    PMH: 1. CAD: STEMI 2015 with DES to LAD.  - Cath in 10/19 showed 90% ostial OM1 and 70% PLV, no interventional target. 2. Hyperlipidemia 3. Cocaine abuse.  4. COPD: Ongoing smoking.  5. GERD 6. Gastric cancer: Intramucosal adenocarcinoma.  - FOLFOX chemotherapy 7. Chronic systolic CHF:  Mixed ischemic/nonischemic (cocaine) cardiomyopathy.  Thought to be end stage, has had low output requiring milrinone in the hospital but not candidate for LVAD, transplant, or home milrinone given ongoing cocaine abuse.  - Echo (10/21): EF 20-25%, global HK with moderate dilation, mild LVH, RV low normal systolic function, PASP 57 mmHg, moderate-severe MR, moderate-severe TR.  8. COVID-19 infection: 9/21.   Current Outpatient Medications  Medication Instructions   acidophilus (RISAQUAD) CAPS capsule 1 capsule, Daily   albuterol (VENTOLIN HFA) 108 (90 Base) MCG/ACT inhaler 2 puffs, Inhalation, Every 4 hours PRN   ASPIRIN LOW DOSE 81 MG EC tablet TAKE 1 TABLET (81 MG TOTAL) BY MOUTH DAILY (MORNING)   atorvastatin (LIPITOR) 80 mg, Oral, Daily   empagliflozin (JARDIANCE) 10 mg, Oral, Daily   gabapentin (NEURONTIN) 100 mg, Oral, 2 times daily   Multiple Vitamins-Minerals (MULTIVITAMIN WITH  MINERALS) tablet 1 tablet, Oral, Daily   nicotine (NICODERM CQ - DOSED IN MG/24 HR) 7 mg, Transdermal, Daily   pantoprazole (PROTONIX) 40 mg, Oral, 2 times daily   polyethylene glycol (MIRALAX / GLYCOLAX) 17 g, Oral, 2 times daily   potassium chloride SA (KLOR-CON) 20 MEQ tablet 40 mEq, Oral, Daily    Torsemide 40 MG TABS Take 80 mg by mouth in the morning AND 40 mg every evening.     No current facility-administered medications for this encounter.    No Known Allergies    Social History   Socioeconomic History   Marital status: Single    Spouse name: Not on file   Number of children: 2   Years of education: 58   Highest education level: 12th grade  Occupational History   Occupation: Landscaping  Tobacco Use   Smoking status: Every Day    Packs/day: 0.25    Years: 30.00    Pack years: 7.50    Types: Cigarettes   Smokeless tobacco: Never   Tobacco comments:    .5 PK PER DAY  Vaping Use   Vaping Use: Never used  Substance and Sexual Activity   Alcohol use: No    Comment: Patient denies abuse, states social drinker   Drug use: Yes    Types: Cocaine, "Crack" cocaine    Comment: heroin   Sexual activity: Yes  Other Topics Concern   Not on file  Social History Narrative   Lives in [place] with his sister and girlfriend. He works Aeronautical engineer work.    Social Determinants of Health   Financial Resource Strain: High Risk   Difficulty of Paying Living Expenses: Very hard  Food Insecurity: Food Insecurity Present   Worried About Charity fundraiser in the Last Year: Sometimes true   Ran Out of Food in the Last Year: Sometimes true  Transportation Needs: No Transportation Needs   Lack of Transportation (Medical): No   Lack of Transportation (Non-Medical): No  Physical Activity: Not on file  Stress: Not on file  Social Connections: Not on file  Intimate Partner Violence: Not At Risk   Fear of Current or Ex-Partner: No   Emotionally Abused: No   Physically Abused: No   Sexually Abused: No     Family History  Problem Relation Age of Onset   Cirrhosis Father    Heart attack Mother    Heart attack Sister    Heart failure Sister    Heart attack Brother     Vitals:   06/11/21 1142  BP: 90/70  Pulse: 98  SpO2: 98%  Weight: 72.7 kg   Wt  Readings from Last 3 Encounters:  06/11/21 72.7 kg  06/04/21 74.4 kg  05/22/21 71.4 kg   Reds Clip 36%   PHYSICAL EXAM:  General:  No resp difficulty HEENT: normal Neck: supple. JVP 9-10. Carotids 2+ bilat; no bruits. No lymphadenopathy or thryomegaly appreciated. Cor: PMI nondisplaced. Regular rate & rhythm. No rubs . + MR  +S3  Lungs: clear Abdomen: soft, nontender, nondistended. No hepatosplenomegaly. No bruits or masses. Good bowel sounds. Extremities: no cyanosis, clubbing, rash, R and LLE trace edema Neuro: alert & orientedx3, cranial nerves grossly intact. moves all 4 extremities w/o difficulty. Affect pleasant  EKG: SR 98 bpm personally reviewed.   ASSESSMENT & PLAN: 1. End Stage Systolic HF:  Anterior MI in 2015, DES to LAD.  Echo in 2015 with EF 40-45%, thought to be ischemic cardiomyopathy. Cath in 10/19 showed  90% ostial OM1 and 70% PLV, no interventional target.  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 Last Echo 10/21 showed EF 20-25% with mild RV dysfunction.  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  Cocaine likely plays a role in his cardiomyopathy. Multiple re-admits for cardiogenic shock. Unfortunately, not a candidate for advanced therapies or home milrinone with active use of cocaine and gastric cancer.  He has end-stage HF.  - Given social situation, gastric cancer, and ongoing substance abuse,  he is not a candidate for any advanced therapies including home milrinone - NYHA III. Reds Clip 36%. Mild volume overload. Continue torsemide 80 mg in am and add 40 mg in pm.  - Off bidil due to hypotension.  - Off spiro/entresto with recent AKI.  No room to add with hypotension.  - Says he has been taking digoxin. Stop today with recent AKI - Continue Jardiance 10 mg daily.  - Continue ivabradine 5 mg bid.   - No ? blocker w/ h/o low output and cocaine use. + cocaine 05/13/21 - Check BMET  2.  CAD: H/o anterior STEMI with DES to LAD in 7/15. Cath in 10/19 with 90% ostial OM1 and 70% PLV, no interventional target. No chest pain.  - Continue ASA 81 and atorvastatin 80 mg daily.  - Needs to refrain from using cocaine - Lipids (1/22) ok.  3. Cocaine abuse: Has been + cocaine each hospital visit.  05/13/21, 10/31/20 10/27/20, 10/03/20 Encouraged to avoid cocaine.   5. CKD: Stage 3- Had AKI in May 2022. Check BMET today. BMET today.  6. Mitral Regurgitation: 09/2020 Moderate to severe on recent echo, suspect functional MR from dilated CMP.  - Not a candidate for Mitraclip candidate with cocaine abuse and compliance issues.  7. Gastric Cancer: Intramucosal adenocarcinoma.  - FOLFOX chemo ongoing.  - Has EGD later today. - Followed by Oncology and GI.   Check BMET today and in 7 days.  Follow up in 2 weeks.   Darrick Grinder, NP-C 06/11/21

## 2021-06-12 ENCOUNTER — Telehealth: Payer: Self-pay

## 2021-06-12 NOTE — Telephone Encounter (Signed)
Arranged for transport for 06-16-21 for CT stimulation

## 2021-06-12 NOTE — Telephone Encounter (Signed)
Explained my role as [facility] nurse and ask about rescheduling client's appt . [facility] nurse picked up client 05-28-2021. Client has now missed two appointments for this procedure Nurse will discuss with Dr Hubbard Hartshorn upon his return to see next steps  for client . Client didn't remember his instructions for pre-op was only going by appointment that was given to him for procedure.  Because he has been homeless and has so many appointments he was getting confused is what he stated.  Will wait to see next steps and let client know.

## 2021-06-12 NOTE — Telephone Encounter (Signed)
TC to client to inform him that Endoscopy procedure not rescheduled yet ,will have to wait on MD's decision as appointment has been scheduled 2 times now . Client understands and admitted again he forgot about pre op orders and doesn't know where the packet given to him is swelling of the ankles many appointments and calls . Nurse will try to coordinate appointments,but didn't know about about his early pre op orders that were given to him 04-09-2021. Endoscopy now has [facility] nurse's number to assist with appointments. [facility] nurse talked with nurse Lenna Sciara  [phone] Transport arranged for 06-16-21 appointment and has transport for 06-13-21 Reminded of appointment pickup time

## 2021-06-12 NOTE — Telephone Encounter (Signed)
Left message to have DR Therisa Doyne 's s nurse call me

## 2021-06-13 ENCOUNTER — Other Ambulatory Visit: Payer: Self-pay

## 2021-06-13 ENCOUNTER — Ambulatory Visit
Admission: RE | Admit: 2021-06-13 | Discharge: 2021-06-13 | Disposition: A | Discharge status: Home / Self Care | Payer: Medicaid Other | Source: Ambulatory Visit | Attending: Radiation Oncology | Admitting: Radiation Oncology

## 2021-06-13 DIAGNOSIS — C163 Malignant neoplasm of pyloric antrum: Secondary | ICD-10-CM | POA: Diagnosis not present

## 2021-06-13 DIAGNOSIS — Z510 Encounter for antineoplastic radiation therapy: Secondary | ICD-10-CM | POA: Diagnosis not present

## 2021-06-16 ENCOUNTER — Telehealth: Payer: Self-pay

## 2021-06-16 ENCOUNTER — Inpatient Hospital Stay (HOSPITAL_BASED_OUTPATIENT_CLINIC_OR_DEPARTMENT_OTHER): Payer: Medicaid Other | Admitting: Oncology

## 2021-06-16 ENCOUNTER — Ambulatory Visit (HOSPITAL_BASED_OUTPATIENT_CLINIC_OR_DEPARTMENT_OTHER)
Admission: RE | Admit: 2021-06-16 | Discharge: 2021-06-16 | Disposition: A | Discharge status: Home / Self Care | Payer: Medicaid Other | Source: Ambulatory Visit | Attending: Oncology | Admitting: Oncology

## 2021-06-16 ENCOUNTER — Other Ambulatory Visit: Payer: Self-pay

## 2021-06-16 ENCOUNTER — Encounter: Payer: Self-pay | Admitting: *Deleted

## 2021-06-16 ENCOUNTER — Inpatient Hospital Stay: Payer: Medicaid Other

## 2021-06-16 VITALS — BP 105/80 | HR 105 | Temp 98.2°F | Resp 20 | Ht 70.0 in | Wt 164.6 lb

## 2021-06-16 DIAGNOSIS — C163 Malignant neoplasm of pyloric antrum: Secondary | ICD-10-CM | POA: Diagnosis not present

## 2021-06-16 DIAGNOSIS — C169 Malignant neoplasm of stomach, unspecified: Secondary | ICD-10-CM | POA: Diagnosis not present

## 2021-06-16 LAB — CBC WITH DIFFERENTIAL (CANCER CENTER ONLY)
Abs Immature Granulocytes: 0.08 10*3/uL — ABNORMAL HIGH (ref 0.00–0.07)
Basophils Absolute: 0 10*3/uL (ref 0.0–0.1)
Basophils Relative: 0 %
Eosinophils Absolute: 0.1 10*3/uL (ref 0.0–0.5)
Eosinophils Relative: 1 %
HCT: 40.5 % (ref 39.0–52.0)
Hemoglobin: 13.2 g/dL (ref 13.0–17.0)
Immature Granulocytes: 1 %
Lymphocytes Relative: 11 %
Lymphs Abs: 1 10*3/uL (ref 0.7–4.0)
MCH: 26.5 pg (ref 26.0–34.0)
MCHC: 32.6 g/dL (ref 30.0–36.0)
MCV: 81.2 fL (ref 80.0–100.0)
Monocytes Absolute: 1.6 10*3/uL — ABNORMAL HIGH (ref 0.1–1.0)
Monocytes Relative: 17 %
Neutro Abs: 6.5 10*3/uL (ref 1.7–7.7)
Neutrophils Relative %: 70 %
Platelet Count: 323 10*3/uL (ref 150–400)
RBC: 4.99 MIL/uL (ref 4.22–5.81)
RDW: 17.4 % — ABNORMAL HIGH (ref 11.5–15.5)
WBC Count: 9.3 10*3/uL (ref 4.0–10.5)
nRBC: 0.4 % — ABNORMAL HIGH (ref 0.0–0.2)

## 2021-06-16 LAB — CMP (CANCER CENTER ONLY)
ALT: 31 U/L (ref 0–44)
AST: 36 U/L (ref 15–41)
Albumin: 3.8 g/dL (ref 3.5–5.0)
Alkaline Phosphatase: 145 U/L — ABNORMAL HIGH (ref 38–126)
Anion gap: 13 (ref 5–15)
BUN: 59 mg/dL — ABNORMAL HIGH (ref 6–20)
CO2: 27 mmol/L (ref 22–32)
Calcium: 9.6 mg/dL (ref 8.9–10.3)
Chloride: 92 mmol/L — ABNORMAL LOW (ref 98–111)
Creatinine: 2.87 mg/dL — ABNORMAL HIGH (ref 0.61–1.24)
GFR, Estimated: 25 mL/min — ABNORMAL LOW (ref >60)
Glucose, Bld: 71 mg/dL (ref 70–99)
Potassium: 5 mmol/L (ref 3.5–5.1)
Sodium: 132 mmol/L — ABNORMAL LOW (ref 135–145)
Total Bilirubin: 1.5 mg/dL — ABNORMAL HIGH (ref 0.3–1.2)
Total Protein: 7.3 g/dL (ref 6.5–8.1)

## 2021-06-16 LAB — CEA (ACCESS): CEA (CHCC): 10.72 ng/mL — ABNORMAL HIGH (ref 0.00–5.00)

## 2021-06-16 NOTE — Progress Notes (Signed)
Provided 1 case Ensure to patient today.

## 2021-06-16 NOTE — Progress Notes (Signed)
Provided patient with [employer] prepaid card at approval of managed care.

## 2021-06-16 NOTE — Telephone Encounter (Signed)
Schedules not accurate ,nurse arranged transport for 11 am appointment and checked back with client this pm an he was seen  this am ,has a list of his upcoming appointments but was having problems with SOB ,will call back later. Record review client to have radiation of stomach 06/23/2021 will have to arrange  transport . Will follow up tomorrow with client

## 2021-06-16 NOTE — Telephone Encounter (Signed)
Checked schedule and client was scheduled for 6-17@1 ;30 pm  Transportation was already arranged

## 2021-06-16 NOTE — Progress Notes (Signed)
Tyler Wong   Diagnosis: Gastric cancer  INTERVAL HISTORY:   Tyler Wong returns for a scheduled visit.  He underwent radiation simulation on 06/13/2021.  He is scheduled begin radiation to the stomach on 06/23/2021.  He is scheduled for a staging CT today.  Tyler Wong reports episodes of abdominal discomfort and bloating occurring daily.  He has dyspnea when this occurs.  The symptoms are relieved with belching, passing flatus, and having bowel movements.  He does not feel he has recurrent heart symptoms. Tyler Wong is now living in a hotel while waiting on placement at the [facility].  He denies current drug use. Objective:  Vital signs in last 24 hours:  Blood pressure 105/80, pulse (!) 105, temperature 98.2 F (36.8 C), temperature source Tympanic, resp. rate 20, height _0  (1.778 m), weight 164 lb 9.6 oz (74.7 kg), SpO2 100 %.    Resp: Lungs clear bilaterally with decreased breath sounds at the right lower posterior chest Cardio: Regular rate and rhythm, the neck veins are distended GI: No mass, nontender Vascular: No leg edema    Portacath/PICC-without erythema  Lab Results:  Lab Results  Component Value Date   WBC 9.3 06/16/2021   HGB 13.2 06/16/2021   HCT 40.5 06/16/2021   MCV 81.2 06/16/2021   PLT 323 06/16/2021   NEUTROABS 6.5 06/16/2021    CMP  Lab Results  Component Value Date   NA 132 (L) 06/16/2021   K 5.0 06/16/2021   CL 92 (L) 06/16/2021   CO2 27 06/16/2021   GLUCOSE 71 06/16/2021   BUN 59 (H) 06/16/2021   CREATININE 2.87 (H) 06/16/2021   CALCIUM 9.6 06/16/2021   PROT 7.3 06/16/2021   ALBUMIN 3.8 06/16/2021   AST 36 06/16/2021   ALT 31 06/16/2021   ALKPHOS 145 (H) 06/16/2021   BILITOT 1.5 (H) 06/16/2021   GFRNONAA 25 (L) 06/16/2021   GFRAA 55 (L) 09/21/2020    Lab Results  Component Value Date   CEA1 5.94 (H) 03/19/2021   Medications: I have reviewed the patient's current  medications.   Assessment/Plan:  Gastric cancer CT abdomen/pelvis 11/01/2020-masslike area measuring 6.3 x 5.6 x 5.6 cm in the antral prepyloric region of the stomach Upper endoscopy 11/05/2020-patchy white plaques in the distal esophagus; 3 nonbleeding cratered gastric ulcers with pigmented material in the gastric antrum largest 10 mm; segmental moderate inflammation characterized by congestion, erosions and erythema in the gastric antrum.  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  No lymphadenopathy or metastatic disease in the abdomen or pelvis. CHF, LVEF 20-25% 10/04/2020 CAD status post prior LAD stent COPD Chronic kidney disease Cocaine usage Port-A-Cath placement 12/06/2020, Interventional Radiology Mild neutropenia secondary to chemotherapy-Udenyca added with cycle 5 FOLFOX Hospital admission 05/13/2021- acute exacerbation of CHF/end-stage heart failure      Disposition: Tyler Wong has a history of gastric cancer.  He underwent treatment with FOLFOX chemotherapy with clinical and radiologic improvement.  He is not a surgical candidate.  The  plan is to begin radiation and concurrent capecitabine depending on the results from the restaging CT today.  He appeared very uncomfortable upon arrival to the clinic today with increased respirations and abdominal discomfort.   His symptoms improved after a bowel movement.  He declined a referral to the emergency room and hospital admission.  I have a low clinical suspicion for a primary cardiopulmonary process.  The creatinine is more elevated today.  He will not be a candidate for capecitabine unless the creatinine improves.  He will return for an office and lab visit later this week.  I recommended he hold the torsemide today.  Betsy Coder, MD  06/16/2021  1:32 PM

## 2021-06-17 ENCOUNTER — Other Ambulatory Visit: Payer: Self-pay

## 2021-06-17 ENCOUNTER — Emergency Department (HOSPITAL_COMMUNITY): Payer: Medicaid Other

## 2021-06-17 ENCOUNTER — Inpatient Hospital Stay (HOSPITAL_COMMUNITY): Payer: Medicaid Other

## 2021-06-17 ENCOUNTER — Encounter (HOSPITAL_COMMUNITY): Payer: Self-pay | Admitting: Internal Medicine

## 2021-06-17 ENCOUNTER — Telehealth: Payer: Self-pay

## 2021-06-17 ENCOUNTER — Inpatient Hospital Stay (HOSPITAL_COMMUNITY)
Admission: EM | Admit: 2021-06-17 | Discharge: 2021-06-23 | DRG: 444 | Disposition: A | Discharge status: Home / Self Care | Payer: Medicaid Other | Attending: Internal Medicine | Admitting: Internal Medicine

## 2021-06-17 DIAGNOSIS — K219 Gastro-esophageal reflux disease without esophagitis: Secondary | ICD-10-CM | POA: Diagnosis present

## 2021-06-17 DIAGNOSIS — R935 Abnormal findings on diagnostic imaging of other abdominal regions, including retroperitoneum: Secondary | ICD-10-CM | POA: Diagnosis not present

## 2021-06-17 DIAGNOSIS — D630 Anemia in neoplastic disease: Secondary | ICD-10-CM | POA: Diagnosis not present

## 2021-06-17 DIAGNOSIS — R570 Cardiogenic shock: Secondary | ICD-10-CM | POA: Diagnosis present

## 2021-06-17 DIAGNOSIS — I2510 Atherosclerotic heart disease of native coronary artery without angina pectoris: Secondary | ICD-10-CM | POA: Diagnosis present

## 2021-06-17 DIAGNOSIS — N170 Acute kidney failure with tubular necrosis: Secondary | ICD-10-CM | POA: Diagnosis not present

## 2021-06-17 DIAGNOSIS — I5084 End stage heart failure: Secondary | ICD-10-CM | POA: Diagnosis not present

## 2021-06-17 DIAGNOSIS — K810 Acute cholecystitis: Secondary | ICD-10-CM | POA: Diagnosis not present

## 2021-06-17 DIAGNOSIS — C163 Malignant neoplasm of pyloric antrum: Secondary | ICD-10-CM | POA: Diagnosis not present

## 2021-06-17 DIAGNOSIS — Z515 Encounter for palliative care: Secondary | ICD-10-CM | POA: Diagnosis not present

## 2021-06-17 DIAGNOSIS — R7401 Elevation of levels of liver transaminase levels: Secondary | ICD-10-CM | POA: Diagnosis not present

## 2021-06-17 DIAGNOSIS — Z20822 Contact with and (suspected) exposure to covid-19: Secondary | ICD-10-CM | POA: Diagnosis present

## 2021-06-17 DIAGNOSIS — J9601 Acute respiratory failure with hypoxia: Secondary | ICD-10-CM | POA: Diagnosis present

## 2021-06-17 DIAGNOSIS — E875 Hyperkalemia: Secondary | ICD-10-CM | POA: Diagnosis not present

## 2021-06-17 DIAGNOSIS — F17210 Nicotine dependence, cigarettes, uncomplicated: Secondary | ICD-10-CM | POA: Diagnosis present

## 2021-06-17 DIAGNOSIS — R6510 Systemic inflammatory response syndrome (SIRS) of non-infectious origin without acute organ dysfunction: Secondary | ICD-10-CM | POA: Diagnosis present

## 2021-06-17 DIAGNOSIS — I5043 Acute on chronic combined systolic (congestive) and diastolic (congestive) heart failure: Secondary | ICD-10-CM | POA: Diagnosis not present

## 2021-06-17 DIAGNOSIS — E872 Acidosis, unspecified: Secondary | ICD-10-CM | POA: Diagnosis present

## 2021-06-17 DIAGNOSIS — I5023 Acute on chronic systolic (congestive) heart failure: Secondary | ICD-10-CM | POA: Diagnosis not present

## 2021-06-17 DIAGNOSIS — B3781 Candidal esophagitis: Secondary | ICD-10-CM | POA: Diagnosis present

## 2021-06-17 DIAGNOSIS — N179 Acute kidney failure, unspecified: Secondary | ICD-10-CM | POA: Diagnosis not present

## 2021-06-17 DIAGNOSIS — Z7189 Other specified counseling: Secondary | ICD-10-CM | POA: Diagnosis not present

## 2021-06-17 DIAGNOSIS — B1910 Unspecified viral hepatitis B without hepatic coma: Secondary | ICD-10-CM | POA: Diagnosis not present

## 2021-06-17 DIAGNOSIS — Z7982 Long term (current) use of aspirin: Secondary | ICD-10-CM

## 2021-06-17 DIAGNOSIS — R7989 Other specified abnormal findings of blood chemistry: Secondary | ICD-10-CM | POA: Diagnosis present

## 2021-06-17 DIAGNOSIS — E1122 Type 2 diabetes mellitus with diabetic chronic kidney disease: Secondary | ICD-10-CM | POA: Diagnosis present

## 2021-06-17 DIAGNOSIS — Z9221 Personal history of antineoplastic chemotherapy: Secondary | ICD-10-CM

## 2021-06-17 DIAGNOSIS — Z85028 Personal history of other malignant neoplasm of stomach: Secondary | ICD-10-CM | POA: Diagnosis not present

## 2021-06-17 DIAGNOSIS — J449 Chronic obstructive pulmonary disease, unspecified: Secondary | ICD-10-CM | POA: Diagnosis present

## 2021-06-17 DIAGNOSIS — N189 Chronic kidney disease, unspecified: Secondary | ICD-10-CM | POA: Diagnosis not present

## 2021-06-17 DIAGNOSIS — Z8249 Family history of ischemic heart disease and other diseases of the circulatory system: Secondary | ICD-10-CM

## 2021-06-17 DIAGNOSIS — R1084 Generalized abdominal pain: Secondary | ICD-10-CM | POA: Diagnosis not present

## 2021-06-17 DIAGNOSIS — I252 Old myocardial infarction: Secondary | ICD-10-CM | POA: Diagnosis not present

## 2021-06-17 DIAGNOSIS — R1011 Right upper quadrant pain: Secondary | ICD-10-CM

## 2021-06-17 DIAGNOSIS — I517 Cardiomegaly: Secondary | ICD-10-CM | POA: Diagnosis not present

## 2021-06-17 DIAGNOSIS — E871 Hypo-osmolality and hyponatremia: Secondary | ICD-10-CM | POA: Diagnosis present

## 2021-06-17 DIAGNOSIS — I427 Cardiomyopathy due to drug and external agent: Secondary | ICD-10-CM | POA: Diagnosis present

## 2021-06-17 DIAGNOSIS — N1832 Chronic kidney disease, stage 3b: Secondary | ICD-10-CM | POA: Diagnosis present

## 2021-06-17 DIAGNOSIS — Z79899 Other long term (current) drug therapy: Secondary | ICD-10-CM

## 2021-06-17 DIAGNOSIS — R0600 Dyspnea, unspecified: Secondary | ICD-10-CM | POA: Diagnosis not present

## 2021-06-17 DIAGNOSIS — K828 Other specified diseases of gallbladder: Secondary | ICD-10-CM | POA: Diagnosis not present

## 2021-06-17 DIAGNOSIS — I130 Hypertensive heart and chronic kidney disease with heart failure and stage 1 through stage 4 chronic kidney disease, or unspecified chronic kidney disease: Secondary | ICD-10-CM | POA: Diagnosis not present

## 2021-06-17 DIAGNOSIS — I5042 Chronic combined systolic (congestive) and diastolic (congestive) heart failure: Secondary | ICD-10-CM

## 2021-06-17 DIAGNOSIS — F1410 Cocaine abuse, uncomplicated: Secondary | ICD-10-CM | POA: Diagnosis present

## 2021-06-17 DIAGNOSIS — K2970 Gastritis, unspecified, without bleeding: Secondary | ICD-10-CM | POA: Diagnosis not present

## 2021-06-17 DIAGNOSIS — R110 Nausea: Secondary | ICD-10-CM | POA: Diagnosis not present

## 2021-06-17 DIAGNOSIS — Z7984 Long term (current) use of oral hypoglycemic drugs: Secondary | ICD-10-CM

## 2021-06-17 DIAGNOSIS — R000 Tachycardia, unspecified: Secondary | ICD-10-CM | POA: Diagnosis not present

## 2021-06-17 DIAGNOSIS — C169 Malignant neoplasm of stomach, unspecified: Secondary | ICD-10-CM | POA: Diagnosis not present

## 2021-06-17 DIAGNOSIS — R188 Other ascites: Secondary | ICD-10-CM

## 2021-06-17 HISTORY — DX: Type 2 diabetes mellitus without complications: E11.9

## 2021-06-17 LAB — RESP PANEL BY RT-PCR (FLU A&B, COVID) ARPGX2
Influenza A by PCR: NEGATIVE
Influenza B by PCR: NEGATIVE
SARS Coronavirus 2 by RT PCR: NEGATIVE

## 2021-06-17 LAB — LACTIC ACID, PLASMA
Lactic Acid, Venous: 2.6 mmol/L (ref 0.5–1.9)
Lactic Acid, Venous: 2.3 mmol/L (ref 0.5–1.9)

## 2021-06-17 LAB — CBC WITH DIFFERENTIAL/PLATELET
Abs Immature Granulocytes: 0.1 10*3/uL — ABNORMAL HIGH (ref 0.00–0.07)
Basophils Absolute: 0.1 10*3/uL (ref 0.0–0.1)
Basophils Relative: 1 %
Eosinophils Absolute: 0.1 10*3/uL (ref 0.0–0.5)
Eosinophils Relative: 1 %
HCT: 38.7 % — ABNORMAL LOW (ref 39.0–52.0)
Hemoglobin: 12.7 g/dL — ABNORMAL LOW (ref 13.0–17.0)
Immature Granulocytes: 1 %
Lymphocytes Relative: 10 %
Lymphs Abs: 0.8 10*3/uL (ref 0.7–4.0)
MCH: 26.2 pg (ref 26.0–34.0)
MCHC: 32.8 g/dL (ref 30.0–36.0)
MCV: 79.8 fL — ABNORMAL LOW (ref 80.0–100.0)
Monocytes Absolute: 1.3 10*3/uL — ABNORMAL HIGH (ref 0.1–1.0)
Monocytes Relative: 16 %
Neutro Abs: 6 10*3/uL (ref 1.7–7.7)
Neutrophils Relative %: 71 %
Platelets: 336 10*3/uL (ref 150–400)
RBC: 4.85 MIL/uL (ref 4.22–5.81)
RDW: 17.5 % — ABNORMAL HIGH (ref 11.5–15.5)
WBC: 8.4 10*3/uL (ref 4.0–10.5)
nRBC: 0.4 % — ABNORMAL HIGH (ref 0.0–0.2)

## 2021-06-17 LAB — URINALYSIS, ROUTINE W REFLEX MICROSCOPIC
Bilirubin Urine: NEGATIVE
Glucose, UA: NEGATIVE mg/dL
Hgb urine dipstick: NEGATIVE
Ketones, ur: NEGATIVE mg/dL
Leukocytes,Ua: NEGATIVE
Nitrite: NEGATIVE
Protein, ur: 100 mg/dL — AB
Specific Gravity, Urine: 1.011 (ref 1.005–1.030)
pH: 5 (ref 5.0–8.0)

## 2021-06-17 LAB — APTT: aPTT: 28 seconds (ref 24–36)

## 2021-06-17 LAB — COMPREHENSIVE METABOLIC PANEL
ALT: 40 U/L (ref 0–44)
AST: 47 U/L — ABNORMAL HIGH (ref 15–41)
Albumin: 3.3 g/dL — ABNORMAL LOW (ref 3.5–5.0)
Alkaline Phosphatase: 168 U/L — ABNORMAL HIGH (ref 38–126)
Anion gap: 12 (ref 5–15)
BUN: 69 mg/dL — ABNORMAL HIGH (ref 6–20)
CO2: 23 mmol/L (ref 22–32)
Calcium: 9.2 mg/dL (ref 8.9–10.3)
Chloride: 94 mmol/L — ABNORMAL LOW (ref 98–111)
Creatinine, Ser: 2.92 mg/dL — ABNORMAL HIGH (ref 0.61–1.24)
GFR, Estimated: 25 mL/min — ABNORMAL LOW (ref >60)
Glucose, Bld: 135 mg/dL — ABNORMAL HIGH (ref 70–99)
Potassium: 5.6 mmol/L — ABNORMAL HIGH (ref 3.5–5.1)
Sodium: 129 mmol/L — ABNORMAL LOW (ref 135–145)
Total Bilirubin: 2 mg/dL — ABNORMAL HIGH (ref 0.3–1.2)
Total Protein: 7.1 g/dL (ref 6.5–8.1)

## 2021-06-17 LAB — LIPASE, BLOOD: Lipase: 52 U/L — ABNORMAL HIGH (ref 11–51)

## 2021-06-17 LAB — PROTIME-INR
INR: 1.5 — ABNORMAL HIGH (ref 0.8–1.2)
Prothrombin Time: 17.7 seconds — ABNORMAL HIGH (ref 11.4–15.2)

## 2021-06-17 MED ORDER — ONDANSETRON HCL 4 MG/2ML IJ SOLN
4.0000 mg | Freq: Once | INTRAMUSCULAR | Status: AC
  Administered 2021-06-17: 4 mg via INTRAVENOUS
  Filled 2021-06-17: qty 2

## 2021-06-17 MED ORDER — FAMOTIDINE IN NACL 20-0.9 MG/50ML-% IV SOLN
20.0000 mg | Freq: Once | INTRAVENOUS | Status: AC
  Administered 2021-06-17: 20 mg via INTRAVENOUS
  Filled 2021-06-17: qty 50

## 2021-06-17 MED ORDER — ASPIRIN EC 81 MG PO TBEC
81.0000 mg | DELAYED_RELEASE_TABLET | Freq: Every day | ORAL | Status: DC
  Administered 2021-06-18 – 2021-06-23 (×6): 81 mg via ORAL
  Filled 2021-06-17 (×6): qty 1

## 2021-06-17 MED ORDER — PANTOPRAZOLE SODIUM 40 MG IV SOLR
40.0000 mg | Freq: Once | INTRAVENOUS | Status: AC
  Administered 2021-06-18: 40 mg via INTRAVENOUS
  Filled 2021-06-17: qty 40

## 2021-06-17 MED ORDER — FLUCONAZOLE 200 MG PO TABS
400.0000 mg | ORAL_TABLET | Freq: Once | ORAL | Status: AC
  Administered 2021-06-18: 400 mg via ORAL
  Filled 2021-06-17: qty 2

## 2021-06-17 MED ORDER — ONDANSETRON HCL 4 MG/2ML IJ SOLN: 4.0000 mg | Freq: Four times a day (QID) | INTRAMUSCULAR | Status: DC | PRN

## 2021-06-17 MED ORDER — FENTANYL CITRATE (PF) 100 MCG/2ML IJ SOLN
50.0000 ug | Freq: Once | INTRAMUSCULAR | Status: AC
  Administered 2021-06-17: 50 ug via INTRAVENOUS
  Filled 2021-06-17: qty 2

## 2021-06-17 MED ORDER — PANTOPRAZOLE SODIUM 40 MG PO TBEC
40.0000 mg | DELAYED_RELEASE_TABLET | Freq: Two times a day (BID) | ORAL | Status: DC
  Administered 2021-06-18 – 2021-06-23 (×10): 40 mg via ORAL
  Filled 2021-06-17 (×10): qty 1

## 2021-06-17 MED ORDER — GABAPENTIN 100 MG PO CAPS
100.0000 mg | ORAL_CAPSULE | Freq: Two times a day (BID) | ORAL | Status: DC
  Administered 2021-06-18 – 2021-06-23 (×12): 100 mg via ORAL
  Filled 2021-06-17 (×12): qty 1

## 2021-06-17 MED ORDER — POLYETHYLENE GLYCOL 3350 17 G PO PACK
17.0000 g | PACK | Freq: Every day | ORAL | Status: DC | PRN
  Administered 2021-06-18 – 2021-06-20 (×3): 17 g via ORAL
  Filled 2021-06-17 (×3): qty 1

## 2021-06-17 MED ORDER — FENTANYL CITRATE (PF) 100 MCG/2ML IJ SOLN
25.0000 ug | Freq: Once | INTRAMUSCULAR | Status: AC
  Administered 2021-06-17: 25 ug via INTRAVENOUS
  Filled 2021-06-17: qty 2

## 2021-06-17 MED ORDER — SODIUM CHLORIDE 0.9 % IV BOLUS
1000.0000 mL | Freq: Once | INTRAVENOUS | Status: AC
  Administered 2021-06-17: 1000 mL via INTRAVENOUS

## 2021-06-17 MED ORDER — OXYCODONE HCL 5 MG PO TABS: 5.0000 mg | ORAL_TABLET | Freq: Once | ORAL | Status: DC

## 2021-06-17 MED ORDER — ACETAMINOPHEN 650 MG RE SUPP: 650.0000 mg | Freq: Four times a day (QID) | RECTAL | Status: DC | PRN

## 2021-06-17 MED ORDER — ATORVASTATIN CALCIUM 80 MG PO TABS
80.0000 mg | ORAL_TABLET | Freq: Every day | ORAL | Status: DC
  Administered 2021-06-18 – 2021-06-23 (×6): 80 mg via ORAL
  Filled 2021-06-17 (×3): qty 1
  Filled 2021-06-17: qty 2
  Filled 2021-06-17 (×2): qty 1

## 2021-06-17 MED ORDER — ENOXAPARIN SODIUM 30 MG/0.3ML IJ SOSY
30.0000 mg | PREFILLED_SYRINGE | INTRAMUSCULAR | Status: DC
  Administered 2021-06-18: 30 mg via SUBCUTANEOUS
  Filled 2021-06-17: qty 0.3

## 2021-06-17 MED ORDER — ACETAMINOPHEN 325 MG PO TABS: 650.0000 mg | ORAL_TABLET | Freq: Four times a day (QID) | ORAL | Status: DC | PRN

## 2021-06-17 MED ORDER — ALBUTEROL SULFATE (2.5 MG/3ML) 0.083% IN NEBU: 2.5000 mg | INHALATION_SOLUTION | RESPIRATORY_TRACT | Status: DC | PRN

## 2021-06-17 MED ORDER — ONDANSETRON HCL 4 MG PO TABS: 4.0000 mg | ORAL_TABLET | Freq: Four times a day (QID) | ORAL | Status: DC | PRN

## 2021-06-17 MED ORDER — FLUCONAZOLE 200 MG PO TABS
200.0000 mg | ORAL_TABLET | Freq: Every day | ORAL | Status: DC
  Administered 2021-06-19 – 2021-06-22 (×4): 200 mg via ORAL
  Filled 2021-06-17 (×7): qty 1

## 2021-06-17 MED ORDER — NYSTATIN 100000 UNIT/ML MT SUSP
5.0000 mL | Freq: Four times a day (QID) | OROMUCOSAL | Status: DC
  Filled 2021-06-17: qty 5

## 2021-06-17 MED ORDER — EMPAGLIFLOZIN 10 MG PO TABS: 10.0000 mg | ORAL_TABLET | Freq: Every day | ORAL | Status: DC

## 2021-06-17 NOTE — ED Notes (Signed)
Critical lactic of 2.6 reported from lab to this RN. This RN notified primary RN

## 2021-06-17 NOTE — ED Provider Notes (Signed)
[facility] EMERGENCY DEPARTMENT Provider Note   CSN: [id number] Arrival date & time: 06/17/21  1711     History Chief Complaint  Patient presents with   Abdominal Pain    Tyler Wong is a 55 y.o. male with a past medical history of gastric cancer, completed chemotherapy over 1 month ago and is set to start radiation soon but has not done so yet.  Presenting to the ED with a chief complaint of worsening abdominal pain and concern for possible acute cholecystitis.  Patient had a CT scan done without IV contrast yesterday ordered by his oncologist.  He was called today and told that he may have an inflamed gallbladder and was sent to the ER.  He states that his epigastric pain has worsened over the past 2 to 3 weeks.  This is worse with bowel movements and urination but denies any dysuria.  He reports nausea but denies any vomiting.  No fevers.  No sick contacts with similar symptoms.  No chest pain, shortness of breath, cough, hemoptysis, leg swelling.  Minimal improvement noted with NSAIDs.   Abdominal Pain Pain location:  Epigastric Pain radiates to:  Does not radiate Pain severity:  Moderate Onset quality:  Gradual Duration:  3 weeks Timing:  Intermittent Worsened by:  Bowel movements and urination Ineffective treatments:  NSAIDs Associated symptoms: nausea   Associated symptoms: no chest pain, no chills, no constipation, no cough, no diarrhea, no dysuria, no fever, no hematuria, no shortness of breath, no sore throat and no vomiting       Past Medical History:  Diagnosis Date   CAD in native artery 07/17/14   STEMI- LAD stenosis with DES   CHF (congestive heart failure) (HCC)    Cocaine abuse (HCC)    COPD (chronic obstructive pulmonary disease) (HCC)    Dyspnea    Gastric cancer ([place]) 11/01/2020   GERD (gastroesophageal reflux disease)    Hypertension    ST elevation myocardial infarction (STEMI) involving left anterior descending (LAD) coronary artery  with complication ([place])    Tobacco abuse 07/17/2014    Patient Active Problem List   Diagnosis Date Noted   SIRS (systemic inflammatory response syndrome) ([place]) 06/17/2021   Port-A-Cath in place 05/22/2021   Acute dyspnea    CHF exacerbation ([place]) 05/13/2021   Gastric cancer ([place]) 11/29/2020   Abdominal pain, generalized 11/05/2020   Congestive heart failure (CHF) ([place]) 11/01/2020   CHF (congestive heart failure) ([place]) 10/31/2020   Palliative care by specialist    Goals of care, counseling/discussion    Acute systolic HF (heart failure) ([place]) 10/03/2020   Acute on chronic systolic CHF (congestive heart failure) ([place]) 10/03/2020   Acute renal failure ([place])    Acute respiratory disease due to COVID-19 virus 09/20/2020   Acute respiratory failure due to COVID-19 ([place]) 09/19/2020   Cardiorenal syndrome 06/29/2020   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   Acute on chronic clinical systolic heart failure ([place]) 06/22/2020   Acute on chronic systolic (congestive) heart failure ([place]) 06/22/2020   Palliative care encounter    Cardiogenic shock ([place]) 04/11/2020   CKD (chronic kidney disease) stage 3, GFR 30-59 ml/min (HCC) 04/11/2020   Symptomatic anemia 04/09/2020   Acute on chronic HFrEF (heart failure with reduced ejection fraction) ([place]) 05/05/2019   HFrEF (heart failure with reduced ejection fraction) ([place]) 05/04/2019   Acute combined systolic and diastolic congestive heart failure ([place]) 10/02/2018   Hepatitis C 04/13/2017   Current non-adherence to medical treatment  04/13/2017   Acute on chronic combined systolic (congestive) and diastolic (congestive) heart failure ([place]) 04/13/2017   HTN (hypertension) 04/12/2017   Insomnia 01/18/2017   Chronic combined systolic and diastolic congestive heart failure ([place]) 01/04/2017   Hyperlipidemia 01/04/2017   Chest pain 12/30/2016   Coronary artery disease involving native heart 07/18/2014   Tobacco abuse 07/17/2014    Cocaine use 07/17/2014    Past Surgical History:  Procedure Laterality Date   BIOPSY  11/05/2020   Procedure: BIOPSY;  Surgeon: Wilford Corner, MD;  Location: [facility];  Service: Endoscopy;;   CORONARY ANGIOPLASTY WITH STENT PLACEMENT  07/18/14   resolute DES to LAD STEMI   ESOPHAGOGASTRODUODENOSCOPY N/A 11/05/2020   Procedure: ESOPHAGOGASTRODUODENOSCOPY (EGD);  Surgeon: Wilford Corner, MD;  Location: [facility];  Service: Endoscopy;  Laterality: N/A;   IR IMAGING GUIDED PORT INSERTION  12/06/2020   LEFT HEART CATH N/A 07/17/2014   Procedure: LEFT HEART CATH;  Surgeon: [name], MD;  Location: [facility] CATH LAB;  Service: Cardiovascular;  Laterality: N/A;   PERCUTANEOUS CORONARY STENT INTERVENTION (PCI-S)  07/17/2014   Procedure: PERCUTANEOUS CORONARY STENT INTERVENTION (PCI-S);  Surgeon: [name], MD;  Location: [facility] CATH LAB;  Service: Cardiovascular;;   RIGHT/LEFT HEART CATH AND CORONARY ANGIOGRAPHY N/A 10/03/2018   Procedure: RIGHT/LEFT HEART CATH AND CORONARY ANGIOGRAPHY;  Surgeon: [name], MD;  Location: [facility] CV LAB;  Service: Cardiovascular;  Laterality: N/A;       Family History  Problem Relation Age of Onset   Cirrhosis Father    Heart attack Mother    Heart attack Sister    Heart failure Sister    Heart attack Brother     Social History   Tobacco Use   Smoking status: Every Day    Packs/day: 0.25    Years: 30.00    Pack years: 7.50    Types: Cigarettes   Smokeless tobacco: Never   Tobacco comments:    .5 PK PER DAY  Vaping Use   Vaping Use: Never used  Substance Use Topics   Alcohol use: No    Comment: Patient denies abuse, states social drinker   Drug use: Yes    Types: Cocaine, "Crack" cocaine    Comment: heroin    Home Medications Prior to Admission medications   Medication Sig Start Date End Date Taking? Authorizing Provider  acidophilus (RISAQUAD) CAPS capsule Take 1 capsule by mouth daily.   Yes [provider]   albuterol (VENTOLIN HFA) 108 (90 Base) MCG/ACT inhaler Inhale 2 puffs into the lungs every 4 (four) hours as needed for wheezing or shortness of breath. 06/04/21  Yes Mayers, Cari S, PA-C  ASPIRIN LOW DOSE 81 MG EC tablet TAKE 1 TABLET (81 MG TOTAL) BY MOUTH DAILY (MORNING) Patient taking differently: Take 81 mg by mouth in the morning. 03/05/21  Yes Bensimhon, Shaune Pascal, MD  atorvastatin (LIPITOR) 80 MG tablet Take 1 tablet (80 mg total) by mouth daily. 11/26/20  Yes [name], MD  empagliflozin (JARDIANCE) 10 MG TABS tablet Take 1 tablet (10 mg total) by mouth daily. 11/26/20  Yes [name], MD  gabapentin (NEURONTIN) 100 MG capsule Take 1 capsule (100 mg total) by mouth 2 (two) times daily. 05/21/21  Yes Nita Sells, MD  Multiple Vitamins-Minerals (MULTIVITAMIN WITH MINERALS) tablet Take 1 tablet by mouth daily.   Yes [provider]  nicotine (NICODERM CQ - DOSED IN MG/24 HR) 7 mg/24hr patch Place 1 patch (7 mg total) onto  the skin daily. 05/22/21  Yes Nita Sells, MD  pantoprazole (PROTONIX) 40 MG tablet Take 1 tablet (40 mg total) by mouth 2 (two) times daily. 05/22/21 06/21/21 Yes Ladell Pier, MD  polyethylene glycol (MIRALAX / GLYCOLAX) 17 g packet Take 17 g by mouth 2 (two) times daily. 05/22/21  Yes Ladell Pier, MD  potassium chloride SA (KLOR-CON) 20 MEQ tablet Take 2 tablets (40 mEq total) by mouth daily. 01/20/21  Yes [name], MD  Torsemide 40 MG TABS Take 80 mg by mouth in the morning AND 40 mg every evening. 06/11/21  Yes [place]ath, Maricela Bo, FNP    Allergies    Patient has no known allergies.  Review of Systems   Review of Systems  Constitutional:  Negative for appetite change, chills and fever.  HENT:  Negative for ear pain, rhinorrhea, sneezing and sore throat.   Eyes:  Negative for photophobia and visual disturbance.  Respiratory:  Negative for cough, chest tightness, shortness of breath and wheezing.   Cardiovascular:   Negative for chest pain and palpitations.  Gastrointestinal:  Positive for abdominal pain and nausea. Negative for blood in stool, constipation, diarrhea and vomiting.  Genitourinary:  Negative for dysuria, hematuria and urgency.  Musculoskeletal:  Negative for myalgias.  Skin:  Negative for rash.  Neurological:  Negative for dizziness, weakness and light-headedness.   Physical Exam Updated Vital Signs BP 99/80 (BP Location: Right Arm)   Pulse (!) 116   Temp (!) 97.3 F (36.3 C) (Oral)   Resp 17   Ht 5\' 10"  (1.778 m)   Wt 75 kg   SpO2 99%   BMI 23.72 kg/m   Physical Exam Vitals and nursing note reviewed.  Constitutional:      General: He is not in acute distress.    Appearance: He is well-developed.  HENT:     Head: Normocephalic and atraumatic.     Nose: Nose normal.  Eyes:     General: No scleral icterus.       Left eye: No discharge.     Conjunctiva/sclera: Conjunctivae normal.  Cardiovascular:     Rate and Rhythm: Regular rhythm. Tachycardia present.     Heart sounds: Normal heart sounds. No murmur heard.   No friction rub. No gallop.  Pulmonary:     Effort: Pulmonary effort is normal. No respiratory distress.     Breath sounds: Normal breath sounds.  Abdominal:     General: Bowel sounds are normal. There is no distension.     Palpations: Abdomen is soft.     Tenderness: There is abdominal tenderness in the epigastric area. There is no guarding.  Musculoskeletal:        General: Normal range of motion.     Cervical back: Normal range of motion and neck supple.  Skin:    General: Skin is warm and dry.     Findings: No rash.  Neurological:     Mental Status: He is alert.     Motor: No abnormal muscle tone.     Coordination: Coordination normal.    ED Results / Procedures / Treatments   Labs (all labs ordered are listed, but only abnormal results are displayed) Labs Reviewed  LACTIC ACID, PLASMA - Abnormal; Notable for the following components:      Result  Value   Lactic Acid, Venous 2.6 (*)    All other components within normal limits  COMPREHENSIVE METABOLIC PANEL - Abnormal; Notable for the following components:   Sodium  129 (*)    Potassium 5.6 (*)    Chloride 94 (*)    Glucose, Bld 135 (*)    BUN 69 (*)    Creatinine, Ser 2.92 (*)    Albumin 3.3 (*)    AST 47 (*)    Alkaline Phosphatase 168 (*)    Total Bilirubin 2.0 (*)    GFR, Estimated 25 (*)    All other components within normal limits  CBC WITH DIFFERENTIAL/PLATELET - Abnormal; Notable for the following components:   Hemoglobin 12.7 (*)    HCT 38.7 (*)    MCV 79.8 (*)    RDW 17.5 (*)    nRBC 0.4 (*)    Monocytes Absolute 1.3 (*)    Abs Immature Granulocytes 0.10 (*)    All other components within normal limits  PROTIME-INR - Abnormal; Notable for the following components:   Prothrombin Time 17.7 (*)    INR 1.5 (*)    All other components within normal limits  RESP PANEL BY RT-PCR (FLU A&B, COVID) ARPGX2  URINE CULTURE  CULTURE, BLOOD (ROUTINE X 2)  CULTURE, BLOOD (ROUTINE X 2)  APTT  LACTIC ACID, PLASMA  URINALYSIS, ROUTINE W REFLEX MICROSCOPIC    EKG EKG Interpretation  Date/Time:  Tuesday June 17 2021 17:43:04 EDT Ventricular Rate:  105 PR Interval:  196 QRS Duration: 100 QT Interval:  352 QTC Calculation: 465 R Axis:   -69 Text Interpretation: Sinus tachycardia Biatrial enlargement Left anterior fascicular block Left ventricular hypertrophy ( Cornell product ) Cannot rule out Inferior infarct (masked by fascicular block?) , age undetermined Anterior infarct , age undetermined ST & T wave abnormality, consider lateral ischemia Abnormal ECG Sinus tachycardia, artifact v1 Confirmed by Lavenia Atlas [id number]) on 06/17/2021 8:44:44 PM  Radiology CT ABDOMEN PELVIS WO CONTRAST  Result Date: 06/17/2021 CLINICAL DATA:  Restaging gastric cancer, malignancy of pyloric antrum, ongoing chemotherapy EXAM: CT ABDOMEN AND PELVIS WITHOUT CONTRAST TECHNIQUE: Multidetector  CT imaging of the abdomen and pelvis was performed following the standard protocol without IV contrast. Oral enteric contrast was administered. COMPARISON:  02/20/2021 FINDINGS: Examination is generally limited by breath motion artifact and lack of intravenous contrast. Lower chest: No acute abnormality. Hepatobiliary: No solid liver abnormality is seen. The gallbladder wall appears thickened (series 2, image 29). No visible gallstones or biliary ductal dilatation. Pancreas: Unremarkable. No pancreatic ductal dilatation. Spleen: Normal in size without significant abnormality. Adrenals/Urinary Tract: Adrenal glands are unremarkable. Kidneys are normal, without renal calculi, solid lesion, or hydronephrosis. Bladder is unremarkable. Stomach/Bowel: No evidence of residual or recurrent mass of the gastric antrum (series 2, image 29). There is new, ill-defined fat stranding about the descending and transverse duodenum as well as the central small bowel mesentery (series 2, image 32, 37). Appendix appears normal. Vascular/Lymphatic: No significant vascular findings are present. No enlarged abdominal or pelvic lymph nodes. Reproductive: No mass or other significant abnormality. Other: No abdominal wall hernia or abnormality. New small volume ascites throughout the abdomen and pelvis. Musculoskeletal: No acute or significant osseous findings. IMPRESSION: 1. Examination is generally limited by breath motion artifact and lack of intravenous contrast. 2. Within this limitation, no evidence of residual or recurrent mass of the gastric antrum. 3. The gallbladder wall appears thickened. No visible gallstones or biliary ductal dilatation. Findings are concerning for acute cholecystitis. 4. There is new, ill-defined fat stranding about the descending and transverse duodenum as well as the central small bowel mesentery, likely reactive to findings of the adjacent gallbladder. 5. New  small volume ascites throughout the abdomen and  pelvis. These results will be called to the ordering clinician or representative by the Radiologist Assistant, and communication documented in the PACS or [employer]. Electronically Signed   By: Eddie Candle M.D.   On: 06/17/2021 08:59   DG Chest Port 1 View  Result Date: 06/17/2021 CLINICAL DATA:  Nausea EXAM: PORTABLE CHEST 1 VIEW COMPARISON:  05/14/2021 FINDINGS: Cardiac shadow is enlarged but stable. Left chest wall port is again seen. Lungs are well aerated bilaterally. No focal infiltrate or sizable effusion is seen. No acute bony abnormality is noted. IMPRESSION: Stable cardiomegaly.  No focal infiltrate is seen. Electronically Signed   By: Inez Catalina M.D.   On: 06/17/2021 20:27   US Abdomen Limited RUQ (LIVER/GB)  Result Date: 06/17/2021 CLINICAL DATA:  Right upper quadrant pain EXAM: ULTRASOUND ABDOMEN LIMITED RIGHT UPPER QUADRANT COMPARISON:  CT 06/16/2021 FINDINGS: Gallbladder: No shadowing stones. Increased gallbladder wall thickness up to 6 mm. Negative sonographic Murphy. Common bile duct: Diameter: 3.1 mm Liver: Echogenicity within normal limits. Shadowing calcification within the right hepatic lobe. Portal vein is patent on color Doppler imaging with normal direction of blood flow towards the liver. Other: None. IMPRESSION: 1. Gallbladder wall thickening without shadowing stone or sonographic Murphy. Findings are nonspecific. If acute gallbladder disease remains a concern, further evaluation with hepatobiliary nuclear medicine imaging could be obtained Electronically Signed   By: Donavan Foil M.D.   On: 06/17/2021 20:09    Procedures Procedures   Medications Ordered in ED Medications  famotidine (PEPCID) IVPB 20 mg premix (20 mg Intravenous New Bag/Given 06/17/21 2105)  sodium chloride 0.9 % bolus 1,000 mL (1,000 mLs Intravenous New Bag/Given 06/17/21 1933)  fentaNYL (SUBLIMAZE) injection 25 mcg (25 mcg Intravenous Given 06/17/21 1937)  ondansetron (ZOFRAN) injection 4 mg (4  mg Intravenous Given 06/17/21 1936)  sodium chloride 0.9 % bolus 1,000 mL (1,000 mLs Intravenous New Bag/Given 06/17/21 2106)    ED Course  I have reviewed the triage vital signs and the nursing notes.  Pertinent labs & imaging results that were available during my care of the patient were reviewed by me and considered in my medical decision making (see chart for details).  Clinical Course as of 06/17/21 2129  Tue Jun 17, 2021  1909 WBC: 8.4 [HK]  1921 Total Bilirubin(!): 2.0 Up from 1.5 about 2 days ago [HK]  1921 Creatinine(!): 2.92 Gradually increasing over the past few weeks. [HK]  1922 Spoke to on-call general surgeon Dr. Barry Dienes.  Reviewed patient's imaging and lab work.  She recommends HIDA scan as he is at risk for developing a calculus cholecystitis.  She recommends admission and hospitalist can consult if the HIDA scan is positive. No abx at this time. [HK]  2043 Lactic Acid, Venous(!!): 2.6 [HK]  2049 Spoke to nuclear medicine tech regarding HIDA scan.  She recommends n.p.o. after midnight and no opiates after midnight to have scan in the morning. [HK]    Clinical Course User Index [HK] Delia Heady, PA-C   MDM Rules/Calculators/A&P                          55 year old male with past medical history of gastric cancer completed chemotherapy 1 month ago and said start radiation soon but has not started yet presenting to the ED for the chief complaint of abdominal pain concern for possible acute cholecystitis.  Patient had a CT scan done without IV contrast but  did have p.o. contrast yesterday.  This was ordered by his oncologist.  Was called today for gallbladder wall thickening for possible cholecystitis and sent to the ER.  States that his epigastric pain will intermittently radiate to the side but is mostly in the middle.  This has worsened over the past 2 to 3 weeks.  This is worse with bowel movements and urination but denies any changes to bowel movements or dysuria.  Reports  nausea but no vomiting.  No fever, chest pain, shortness of breath.  On exam patient with tenderness palpation of the epigastric area without rebound or guarding.  He is tachycardic here.  Lungs are clear to auscultation bilaterally.  Work-up significant for no leukocytosis.  He has an INR of 1.5.  His T bilirubin is 2, yesterday it was 1.5.  AST is slightly elevated.  He has hyponatremia of 129, hyperkalemia of 5.6.  His creatinine has gradually elevated over the past month.  Reviewed CT scan done yesterday which shows gallbladder wall thickening which could be concerning for acute cholecystitis.  Right upper quadrant ultrasound was done today which again redemonstrates this nonspecific gallbladder wall thickening without evidence of choledocholithiasis or stone.  Spoke to on-call general surgery who recommends admission, HIDA scan as patient is at risk for developing a calculus cholecystitis.  Can hold off on antibiotics for now.  Will give IV fluids as well as symptom control.  Will admit to hospitalist service. Nuclear medicine tech recommends n.p.o. and no opiates after midnight to get the scan done in the morning. I have informed hospitalist of this recommendation.     Portions of this note were generated with [employer]. Dictation errors may occur despite best attempts at proofreading.  Final Clinical Impression(s) / ED Diagnoses Final diagnoses:  RUQ abdominal pain  Hyponatremia  AKI (acute kidney injury) ([place])  Thickening of wall of gallbladder    Rx / DC Orders ED Discharge Orders     None        Delia Heady, PA-C 06/17/21 2133    Lorelle Gibbs, DO 06/17/21 2255

## 2021-06-17 NOTE — Telephone Encounter (Signed)
Palliative care volunteer call placed to check in on patient. No answer

## 2021-06-17 NOTE — Telephone Encounter (Signed)
TC from [facility] Radiology calling report on Pt's CT scan results. Dr. Benay Spice informed.

## 2021-06-17 NOTE — Telephone Encounter (Signed)
TC to Pt to inform him that his CT scan showed that he has inflammation of the gallbladder (cholecystitis) which is causing him to have the abdominal pain. Informed him that Dr. Benay Spice wants him to go to the ER. Pt states he is still having pain it comes and goes. Informed Pt that the pain will not go away and to please go to the ER. Pt was hesitant but agreed to go to the ER. Will follow up.

## 2021-06-17 NOTE — ED Notes (Signed)
Patient transported to Ultrasound 

## 2021-06-17 NOTE — ED Provider Notes (Signed)
Emergency Medicine Provider Triage Evaluation Note  Tyler Wong , a 55 y.o. male  was evaluated in triage.  Pt complains of epigastric and right upper quadrant pain for 7 months, being treated for gastric cancer.  States he has CT scan yesterday and his oncologist called him today and told him to come to the emergency department for inflamed gallbladder.  He has completed chemotherapy treatment is about to start radiation therapy for his malignancy.  Per chart review, patient with evidence of gallbladder wall thickening concerning for acute cholecystitis with surrounding ill-defined fat stranding.  He denies fevers or chills at home endorses pain is pain secondary to his malignancy but has gotten worse in the last week.  Review of Systems  Positive: Epigastric and right upper quadrant pain shortness of breath Negative: Fevers, chills, nausea, vomiting, chest pain, palpitations  Physical Exam  BP 92/69   Pulse (!) 106   Temp (!) 97.3 F (36.3 C) (Oral)   Resp (!) 22   SpO2 99%  Gen:   Awake, appears uncomfortable Resp:  Normal effort tachypneic MSK:   Moves extremities without difficulty  Other:  Epigastric and right upper quadrant tenderness to palpation without rebound or guarding.  Tachycardic with regular rhythm, no M/R/G.  Medical Decision Making  Medically screening exam initiated at 5:54 PM.  Appropriate orders placed.  Tyler Wong was informed that the remainder of the evaluation will be completed by another provider, this initial triage assessment does not replace that evaluation, and the importance of remaining in the ED until their evaluation is complete.  We will proceed with sepsis work-up given patient is immunocompromised on chemotherapy.  Patient will require IV antibiotics for likely cholecystitis.  Analgesia and antiemetic offered.  This chart was dictated using voice recognition software, Dragon. Despite the best efforts of this provider to proofread and correct errors,  errors may still occur which can change documentation meaning.    Aura Dials 06/17/21 1757    Truddie Hidden, MD 06/17/21 2250

## 2021-06-17 NOTE — ED Triage Notes (Signed)
Pt reports 7 months of abdominal pain. Dx with "abdominal" cancer and had PET scan yesterday and oncologist called him today and said he has an inflamed gal bladder and that he should go to the ED. Pt having nausea and dry heaves. Has finished chemo and is about to start radiation.

## 2021-06-18 ENCOUNTER — Inpatient Hospital Stay (HOSPITAL_COMMUNITY): Payer: Medicaid Other

## 2021-06-18 DIAGNOSIS — R1011 Right upper quadrant pain: Secondary | ICD-10-CM | POA: Insufficient documentation

## 2021-06-18 DIAGNOSIS — E872 Acidosis, unspecified: Secondary | ICD-10-CM | POA: Diagnosis present

## 2021-06-18 DIAGNOSIS — E875 Hyperkalemia: Secondary | ICD-10-CM | POA: Diagnosis present

## 2021-06-18 LAB — CBC WITH DIFFERENTIAL/PLATELET
Abs Immature Granulocytes: 0.21 10*3/uL — ABNORMAL HIGH (ref 0.00–0.07)
Basophils Absolute: 0.1 10*3/uL (ref 0.0–0.1)
Basophils Relative: 1 %
Eosinophils Absolute: 0.1 10*3/uL (ref 0.0–0.5)
Eosinophils Relative: 1 %
HCT: 40.6 % (ref 39.0–52.0)
Hemoglobin: 13.1 g/dL (ref 13.0–17.0)
Immature Granulocytes: 2 %
Lymphocytes Relative: 13 %
Lymphs Abs: 1.4 10*3/uL (ref 0.7–4.0)
MCH: 26.5 pg (ref 26.0–34.0)
MCHC: 32.3 g/dL (ref 30.0–36.0)
MCV: 82 fL (ref 80.0–100.0)
Monocytes Absolute: 1.7 10*3/uL — ABNORMAL HIGH (ref 0.1–1.0)
Monocytes Relative: 14 %
Neutro Abs: 8.1 10*3/uL — ABNORMAL HIGH (ref 1.7–7.7)
Neutrophils Relative %: 69 %
Platelets: 339 10*3/uL (ref 150–400)
RBC: 4.95 MIL/uL (ref 4.22–5.81)
RDW: 18.4 % — ABNORMAL HIGH (ref 11.5–15.5)
WBC: 11.5 10*3/uL — ABNORMAL HIGH (ref 4.0–10.5)
nRBC: 0.4 % — ABNORMAL HIGH (ref 0.0–0.2)

## 2021-06-18 LAB — COMPREHENSIVE METABOLIC PANEL
ALT: 38 U/L (ref 0–44)
AST: 46 U/L — ABNORMAL HIGH (ref 15–41)
Albumin: 3 g/dL — ABNORMAL LOW (ref 3.5–5.0)
Alkaline Phosphatase: 139 U/L — ABNORMAL HIGH (ref 38–126)
Anion gap: 13 (ref 5–15)
BUN: 66 mg/dL — ABNORMAL HIGH (ref 6–20)
CO2: 21 mmol/L — ABNORMAL LOW (ref 22–32)
Calcium: 8.5 mg/dL — ABNORMAL LOW (ref 8.9–10.3)
Chloride: 97 mmol/L — ABNORMAL LOW (ref 98–111)
Creatinine, Ser: 2.7 mg/dL — ABNORMAL HIGH (ref 0.61–1.24)
GFR, Estimated: 27 mL/min — ABNORMAL LOW (ref >60)
Glucose, Bld: 157 mg/dL — ABNORMAL HIGH (ref 70–99)
Potassium: 5.8 mmol/L — ABNORMAL HIGH (ref 3.5–5.1)
Sodium: 131 mmol/L — ABNORMAL LOW (ref 135–145)
Total Bilirubin: 2.4 mg/dL — ABNORMAL HIGH (ref 0.3–1.2)
Total Protein: 6.4 g/dL — ABNORMAL LOW (ref 6.5–8.1)

## 2021-06-18 LAB — APTT: aPTT: 28 seconds (ref 24–36)

## 2021-06-18 LAB — PROTIME-INR
INR: 1.7 — ABNORMAL HIGH (ref 0.8–1.2)
Prothrombin Time: 19.9 seconds — ABNORMAL HIGH (ref 11.4–15.2)

## 2021-06-18 LAB — LACTIC ACID, PLASMA: Lactic Acid, Venous: 5.7 mmol/L (ref 0.5–1.9)

## 2021-06-18 LAB — MAGNESIUM: Magnesium: 2.3 mg/dL (ref 1.7–2.4)

## 2021-06-18 LAB — PROCALCITONIN: Procalcitonin: 2.9 ng/mL

## 2021-06-18 LAB — CORTISOL-AM, BLOOD: Cortisol - AM: 99.9 ug/dL — ABNORMAL HIGH (ref 6.7–22.6)

## 2021-06-18 MED ORDER — TECHNETIUM TC 99M MEBROFENIN IV KIT
7.6000 | PACK | Freq: Once | INTRAVENOUS | Status: AC | PRN
  Administered 2021-06-18: 7.6 via INTRAVENOUS

## 2021-06-18 MED ORDER — SODIUM ZIRCONIUM CYCLOSILICATE 10 G PO PACK
10.0000 g | PACK | Freq: Once | ORAL | Status: AC
  Administered 2021-06-18: 10 g via ORAL
  Filled 2021-06-18: qty 1

## 2021-06-18 MED ORDER — FENTANYL CITRATE (PF) 100 MCG/2ML IJ SOLN
50.0000 ug | INTRAMUSCULAR | Status: DC | PRN
  Administered 2021-06-18 – 2021-06-20 (×2): 50 ug via INTRAVENOUS
  Filled 2021-06-18 (×2): qty 2

## 2021-06-18 MED ORDER — EMPAGLIFLOZIN 10 MG PO TABS: 10.0000 mg | ORAL_TABLET | Freq: Every day | ORAL | Status: DC

## 2021-06-18 MED ORDER — PIPERACILLIN-TAZOBACTAM 3.375 G IVPB
3.3750 g | Freq: Three times a day (TID) | INTRAVENOUS | Status: AC
  Administered 2021-06-18 – 2021-06-22 (×15): 3.375 g via INTRAVENOUS
  Filled 2021-06-18 (×15): qty 50

## 2021-06-18 MED ORDER — PIPERACILLIN-TAZOBACTAM 3.375 G IVPB 30 MIN
3.3750 g | Freq: Once | INTRAVENOUS | Status: AC
  Administered 2021-06-18: 3.375 g via INTRAVENOUS
  Filled 2021-06-18: qty 50

## 2021-06-18 MED ORDER — FENTANYL CITRATE (PF) 100 MCG/2ML IJ SOLN
25.0000 ug | INTRAMUSCULAR | Status: DC | PRN
  Administered 2021-06-20 (×3): 25 ug via INTRAVENOUS
  Filled 2021-06-18 (×3): qty 2

## 2021-06-18 MED ORDER — ENOXAPARIN SODIUM 40 MG/0.4ML IJ SOSY
40.0000 mg | PREFILLED_SYRINGE | INTRAMUSCULAR | Status: DC
  Administered 2021-06-19: 40 mg via SUBCUTANEOUS
  Filled 2021-06-18: qty 0.4

## 2021-06-18 NOTE — ED Notes (Signed)
Breakfast order placed ?

## 2021-06-18 NOTE — Progress Notes (Signed)
Pharmacy Antibiotic Note  Tyler Wong is a 55 y.o. male admitted on 06/17/2021 with acalculous acute cholecystitis.  Pharmacy has been consulted for Zosyn dosing.  Plan: Zosyn 3.375g IV q8h (4 hour infusion).  Height: 5\' 10"  (177.8 cm) Weight: 75 kg (165 lb 5.5 oz) IBW/kg (Calculated) : 73  Temp (24hrs), Avg:97.3 F (36.3 C), Min:97.3 F (36.3 C), Max:97.3 F (36.3 C)  Recent Labs  Lab 06/11/21 1216 06/16/21 1122 06/17/21 1836 06/17/21 2043  WBC  --  9.3 8.4  --   CREATININE 2.19* 2.87* 2.92*  --   LATICACIDVEN  --   --  2.6* 2.3*    Estimated Creatinine Clearance: 29.5 mL/min (A) (by C-G formula based on SCr of 2.92 mg/dL (H)).    No Known Allergies   Thank you for allowing pharmacy to be a part of this patient's care.  Wynona Neat, PharmD, BCPS  06/18/2021 12:19 AM

## 2021-06-18 NOTE — H&P (Signed)
History and Physical    Tyler Wong:[id number] DOB: 1966/09/29 DOA: 06/17/2021  PCP: [name], NP  Patient coming from: Home   Chief Complaint:  Chief Complaint  Patient presents with   Abdominal Pain     HPI:     55 year old male with past medical history of gastric cancer (Dx 10/2010), chronic kidney disease stage IIIb, advanced cardiomyopathy with systolic congestive heart failure Echo 09/2020 EF 20-25%), coronary artery disease (STEMI 2015 with DES to LAD), substance abuse (cocaine), COPD, gastroesophageal reflux disease who presents [facility] emergency department at the direction of his oncologist due to concerns for cholecystitis.  Patient explains that for the past several weeks he has been experiencing increasing abdominal pain.  Patient describes his abdominal pain is epigastric in location, radiating around the abdomen, severe in intensity, sharp in quality, worse with ingestion of foods and associated with decreased appetite and generalized weakness.  Patient complains of nausea but denies vomiting.  Patient denies fevers.  Patient denies dysuria or diarrhea.  Patient denies recent travel sick contacts or contacts with confirmed COVID-19 infection.  Patient was seen in oncology clinic on 6/20 in preparation for upcoming initiation of radiation therapy on 6/27 during this evaluation patient's symptoms prompted Dr.Sherrill to obtain CT imaging of the abdomen which revealed possible developing acute cholecystitis and therefore the patient was instructed on 6/21 to go to the emergency department for evaluation.    Upon evaluation in the emergency department patient was found to exhibit multiple SIRS criteria including tachycardia and tachypnea in setting of lactic acidosis of 2.6.  ER provider ordered 2 L of lactated Ringer solution bolus for the patient.  Patient was administered intravenous fentanyl for pain.  ER provider discussed case with Dr. Barry Dienes with  general surgery who recommended obtaining a HIDA scan, admission to medicine and formal consultation of surgery if the HIDA scan is abnormal.  The hospitalist group was then called to assess the patient for admission to the hospital.  Review of Systems:   Review of Systems  Constitutional:  Positive for malaise/fatigue.  Gastrointestinal:  Positive for abdominal pain and nausea.  Neurological:  Positive for weakness.  All other systems reviewed and are negative.  Past Medical History:  Diagnosis Date   CAD in native artery 07/17/14   STEMI- LAD stenosis with DES   CHF (congestive heart failure) (HCC)    Cocaine abuse ([place])    COPD (chronic obstructive pulmonary disease) (HCC)    Dyspnea    Gastric cancer ([place]) 11/01/2020   GERD (gastroesophageal reflux disease)    Hypertension    ST elevation myocardial infarction (STEMI) involving left anterior descending (LAD) coronary artery with complication ([place])    Tobacco abuse 07/17/2014    Past Surgical History:  Procedure Laterality Date   BIOPSY  11/05/2020   Procedure: BIOPSY;  Surgeon: Wilford Corner, MD;  Location: [place];  Service: Endoscopy;;   CORONARY ANGIOPLASTY WITH STENT PLACEMENT  07/18/14   resolute DES to LAD STEMI   ESOPHAGOGASTRODUODENOSCOPY N/A 11/05/2020   Procedure: ESOPHAGOGASTRODUODENOSCOPY (EGD);  Surgeon: Wilford Corner, MD;  Location: [place];  Service: Endoscopy;  Laterality: N/A;   IR IMAGING GUIDED PORT INSERTION  12/06/2020   LEFT HEART CATH N/A 07/17/2014   Procedure: LEFT HEART CATH;  Surgeon: [name], MD;  Location: [facility] CATH LAB;  Service: Cardiovascular;  Laterality: N/A;   PERCUTANEOUS CORONARY STENT INTERVENTION (PCI-S)  07/17/2014   Procedure: PERCUTANEOUS CORONARY STENT INTERVENTION (PCI-S);  Surgeon: Joyice Faster  Claiborne Billings, MD;  Location: [facility] CATH LAB;  Service: Cardiovascular;;   RIGHT/LEFT HEART CATH AND CORONARY ANGIOGRAPHY N/A 10/03/2018   Procedure: RIGHT/LEFT HEART CATH AND CORONARY  ANGIOGRAPHY;  Surgeon: [name], MD;  Location: [place] CV LAB;  Service: Cardiovascular;  Laterality: N/A;     reports that he has been smoking cigarettes. He has a 7.50 pack-year smoking history. He has never used smokeless tobacco. He reports current drug use. Drugs: Cocaine and "Crack" cocaine. He reports that he does not drink alcohol.  No Known Allergies  Family History  Problem Relation Age of Onset   Cirrhosis Father    Heart attack Mother    Heart attack Sister    Heart failure Sister    Heart attack Brother      Prior to Admission medications   Medication Sig Start Date End Date Taking? Authorizing Provider  acidophilus (RISAQUAD) CAPS capsule Take 1 capsule by mouth daily.   Yes [provider]  albuterol (VENTOLIN HFA) 108 (90 Base) MCG/ACT inhaler Inhale 2 puffs into the lungs every 4 (four) hours as needed for wheezing or shortness of breath. 06/04/21  Yes Mayers, Cari S, PA-C  ASPIRIN LOW DOSE 81 MG EC tablet TAKE 1 TABLET (81 MG TOTAL) BY MOUTH DAILY (MORNING) Patient taking differently: Take 81 mg by mouth in the morning. 03/05/21  Yes Bensimhon, Shaune Pascal, MD  atorvastatin (LIPITOR) 80 MG tablet Take 1 tablet (80 mg total) by mouth daily. 11/26/20  Yes [name], MD  empagliflozin (JARDIANCE) 10 MG TABS tablet Take 1 tablet (10 mg total) by mouth daily. 11/26/20  Yes [name], MD  gabapentin (NEURONTIN) 100 MG capsule Take 1 capsule (100 mg total) by mouth 2 (two) times daily. 05/21/21  Yes Nita Sells, MD  Multiple Vitamins-Minerals (MULTIVITAMIN WITH MINERALS) tablet Take 1 tablet by mouth daily.   Yes [provider]  nicotine (NICODERM CQ - DOSED IN MG/24 HR) 7 mg/24hr patch Place 1 patch (7 mg total) onto the skin daily. 05/22/21  Yes Nita Sells, MD  pantoprazole (PROTONIX) 40 MG tablet Take 1 tablet (40 mg total) by mouth 2 (two) times daily. 05/22/21 06/21/21 Yes Ladell Pier, MD  polyethylene glycol  (MIRALAX / GLYCOLAX) 17 g packet Take 17 g by mouth 2 (two) times daily. 05/22/21  Yes Ladell Pier, MD  potassium chloride SA (KLOR-CON) 20 MEQ tablet Take 2 tablets (40 mEq total) by mouth daily. 01/20/21  Yes [name], MD  Torsemide 40 MG TABS Take 80 mg by mouth in the morning AND 40 mg every evening. 06/11/21  Yes Rafael Bihari, [place]    Physical Exam: Vitals:   06/17/21 2230 06/17/21 2300 06/17/21 2315 06/17/21 2330  BP: 106/81 109/77 98/76 103/88  Pulse:      Resp: (!) 21 (!) 33    Temp:      TempSrc:      SpO2:      Weight:      Height:        Constitutional: Awake alert and oriented x3, patient is in mild distress due to abdominal pain. Skin: no rashes, no lesions, good skin turgor noted. Eyes: Pupils are equally reactive to light.  No evidence of scleral icterus or conjunctival pallor.  ENMT: Moist mucous membranes noted.  Posterior pharynx clear of any exudate or lesions.   Neck: normal, supple, no masses, no thyromegaly.  No evidence of jugular venous distension.   Respiratory: clear to auscultation  bilaterally, no wheezing, no crackles. Normal respiratory effort. No accessory muscle use.  Cardiovascular: Tachycardic rate with regular rhythm, no murmurs / rubs / gallops. No extremity edema. 2+ pedal pulses. No carotid bruits.  Chest:   Nontender without crepitus or deformity.   Back:   Nontender without crepitus or deformity. Abdomen: Notable epigastric and right upper quadrant tenderness.  Abdomen is soft.  No evidence of intra-abdominal masses.  Positive bowel sounds noted in all quadrants.   Musculoskeletal: No joint deformity upper and lower extremities. Good ROM, no contractures. Normal muscle tone.  Neurologic: Patient is somewhat tremulous.  CN 2-12 grossly intact. Sensation intact.  Patient moving all 4 extremities spontaneously.  Patient is following all commands.  Patient is responsive to verbal stimuli.   Psychiatric: Patient exhibits anxious mood with  labile affect.  Patient seems to possess insight as to their current situation.     Labs on Admission: I have personally reviewed following labs and imaging studies -   CBC: Recent Labs  Lab 06/16/21 1122 06/17/21 1836  WBC 9.3 8.4  NEUTROABS 6.5 6.0  HGB 13.2 12.7*  HCT 40.5 38.7*  MCV 81.2 79.8*  PLT 323 202   Basic Metabolic Panel: Recent Labs  Lab 06/11/21 1216 06/16/21 1122 06/17/21 1836  NA 131* 132* 129*  K 5.2* 5.0 5.6*  CL 97* 92* 94*  CO2 23 27 23   GLUCOSE 92 71 135*  BUN 44* 59* 69*  CREATININE 2.19* 2.87* 2.92*  CALCIUM 9.4 9.6 9.2   GFR: Estimated Creatinine Clearance: 29.5 mL/min (A) (by C-G formula based on SCr of 2.92 mg/dL (H)). Liver Function Tests: Recent Labs  Lab 06/16/21 1122 06/17/21 1836  AST 36 47*  ALT 31 40  ALKPHOS 145* 168*  BILITOT 1.5* 2.0*  PROT 7.3 7.1  ALBUMIN 3.8 3.3*   Recent Labs  Lab 06/17/21 1836  LIPASE 52*   No results for input(s): AMMONIA in the last 168 hours. Coagulation Profile: Recent Labs  Lab 06/17/21 1836  INR 1.5*   Cardiac Enzymes: No results for input(s): CKTOTAL, CKMB, CKMBINDEX, TROPONINI in the last 168 hours. BNP (last 3 results) No results for input(s): PROBNP in the last 8760 hours. HbA1C: No results for input(s): HGBA1C in the last 72 hours. CBG: No results for input(s): GLUCAP in the last 168 hours. Lipid Profile: No results for input(s): CHOL, HDL, LDLCALC, TRIG, CHOLHDL, LDLDIRECT in the last 72 hours. Thyroid Function Tests: No results for input(s): TSH, T4TOTAL, FREET4, T3FREE, THYROIDAB in the last 72 hours. Anemia Panel: No results for input(s): VITAMINB12, FOLATE, FERRITIN, TIBC, IRON, RETICCTPCT in the last 72 hours. Urine analysis:    Component Value Date/Time   COLORURINE YELLOW 06/17/2021 2225   APPEARANCEUR CLEAR 06/17/2021 2225   LABSPEC 1.011 06/17/2021 2225   PHURINE 5.0 06/17/2021 2225   GLUCOSEU NEGATIVE 06/17/2021 2225   HGBUR NEGATIVE 06/17/2021 2225    BILIRUBINUR NEGATIVE 06/17/2021 2225   KETONESUR NEGATIVE 06/17/2021 2225   PROTEINUR 100 (A) 06/17/2021 2225   NITRITE NEGATIVE 06/17/2021 2225   LEUKOCYTESUR NEGATIVE 06/17/2021 2225    Radiological Exams on Admission - Personally Reviewed: CT ABDOMEN PELVIS WO CONTRAST  Result Date: 06/17/2021 CLINICAL DATA:  Sepsis Abdominal pain, acute, nonlocalized. Dx with "abdominal" cancer and had PET scan yesterday and oncologist called him today and said he has an inflamed gal bladder and that he should go to the ED EXAM: CT ABDOMEN AND PELVIS WITHOUT CONTRAST TECHNIQUE: Multidetector CT imaging of the abdomen and pelvis was  performed following the standard protocol without IV contrast. COMPARISON:  Ultrasound abdomen 06/17/2021, CT abdomen pelvis 06/16/2021, CT abdomen pelvis 02/20/2021, PET CT 11/28/2020 FINDINGS: Lower chest: Cardiomegaly.  Persistent trace pericardial effusion. Hepatobiliary: No focal liver abnormality. Gallbladder wall thickening and haziness. No CT evidence of a calcified stone. No biliary dilatation. Pancreas: No focal lesion. Normal pancreatic contour. No surrounding inflammatory changes. No main pancreatic ductal dilatation. Spleen: Normal in size without focal abnormality. Adrenals/Urinary Tract: No adrenal nodule bilaterally. No nephrolithiasis, no hydronephrosis, and no contour-deforming renal mass. No ureterolithiasis or hydroureter. The urinary bladder is unremarkable. Stomach/Bowel: PO contrast reaches the rectum. Haziness as well as question of wall thickening of the distal duodenum with associated fat stranding and free fluid within the upper abdomen. Stomach is within normal limits. No evidence of large bowel wall thickening or dilatation. The appendix not definitely identified. Vascular/Lymphatic: No abdominal aorta or iliac aneurysm. Mild atherosclerotic plaque of the aorta and its branches. No abdominal, pelvic, or inguinal lymphadenopathy. Reproductive: Prostate is  unremarkable. Other: No intraperitoneal free fluid. No intraperitoneal free gas. No organized fluid collection. Musculoskeletal: No abdominal wall hernia or abnormality. No suspicious lytic or blastic osseous lesions. No acute displaced fracture. IMPRESSION: 1. Findings concerning for acalculous acute cholecystitis. Differential diagnosis of nonspecific stand alone gallbladder wall thickening includes chronic liver disease versus malignancy. A HIDA scan could be obtained for further evaluation of likely acalculous acute cholecystitis. 2. Haziness as well as question of wall thickening of the distal duodenum with associated fat stranding and free fluid within the upper abdomen. Finding may represent reactive changes versus duodenitis. 3. Given upper abdominal fat stranding in the vicinity of the pancreas, consider evaluation with lipase levels. 4. Trace simple free fluid ascites. 5. Markedly limited evaluation due to noncontrast study in a patient with known malignancy. Electronically Signed   By: Iven Finn M.D.   On: 06/17/2021 22:23   CT ABDOMEN PELVIS WO CONTRAST  Result Date: 06/17/2021 CLINICAL DATA:  Restaging gastric cancer, malignancy of pyloric antrum, ongoing chemotherapy EXAM: CT ABDOMEN AND PELVIS WITHOUT CONTRAST TECHNIQUE: Multidetector CT imaging of the abdomen and pelvis was performed following the standard protocol without IV contrast. Oral enteric contrast was administered. COMPARISON:  02/20/2021 FINDINGS: Examination is generally limited by breath motion artifact and lack of intravenous contrast. Lower chest: No acute abnormality. Hepatobiliary: No solid liver abnormality is seen. The gallbladder wall appears thickened (series 2, image 29). No visible gallstones or biliary ductal dilatation. Pancreas: Unremarkable. No pancreatic ductal dilatation. Spleen: Normal in size without significant abnormality. Adrenals/Urinary Tract: Adrenal glands are unremarkable. Kidneys are normal, without  renal calculi, solid lesion, or hydronephrosis. Bladder is unremarkable. Stomach/Bowel: No evidence of residual or recurrent mass of the gastric antrum (series 2, image 29). There is new, ill-defined fat stranding about the descending and transverse duodenum as well as the central small bowel mesentery (series 2, image 32, 37). Appendix appears normal. Vascular/Lymphatic: No significant vascular findings are present. No enlarged abdominal or pelvic lymph nodes. Reproductive: No mass or other significant abnormality. Other: No abdominal wall hernia or abnormality. New small volume ascites throughout the abdomen and pelvis. Musculoskeletal: No acute or significant osseous findings. IMPRESSION: 1. Examination is generally limited by breath motion artifact and lack of intravenous contrast. 2. Within this limitation, no evidence of residual or recurrent mass of the gastric antrum. 3. The gallbladder wall appears thickened. No visible gallstones or biliary ductal dilatation. Findings are concerning for acute cholecystitis. 4. There is new, ill-defined fat stranding  about the descending and transverse duodenum as well as the central small bowel mesentery, likely reactive to findings of the adjacent gallbladder. 5. New small volume ascites throughout the abdomen and pelvis. These results will be called to the ordering clinician or representative by the Radiologist Assistant, and communication documented in the PACS or [employer]. Electronically Signed   By: Eddie Candle M.D.   On: 06/17/2021 08:59   DG Chest Port 1 View  Result Date: 06/17/2021 CLINICAL DATA:  Nausea EXAM: PORTABLE CHEST 1 VIEW COMPARISON:  05/14/2021 FINDINGS: Cardiac shadow is enlarged but stable. Left chest wall port is again seen. Lungs are well aerated bilaterally. No focal infiltrate or sizable effusion is seen. No acute bony abnormality is noted. IMPRESSION: Stable cardiomegaly.  No focal infiltrate is seen. Electronically Signed   By: Inez Catalina M.D.   On: 06/17/2021 20:27   US Abdomen Limited RUQ (LIVER/GB)  Result Date: 06/17/2021 CLINICAL DATA:  Right upper quadrant pain EXAM: ULTRASOUND ABDOMEN LIMITED RIGHT UPPER QUADRANT COMPARISON:  CT 06/16/2021 FINDINGS: Gallbladder: No shadowing stones. Increased gallbladder wall thickness up to 6 mm. Negative sonographic Murphy. Common bile duct: Diameter: 3.1 mm Liver: Echogenicity within normal limits. Shadowing calcification within the right hepatic lobe. Portal vein is patent on color Doppler imaging with normal direction of blood flow towards the liver. Other: None. IMPRESSION: 1. Gallbladder wall thickening without shadowing stone or sonographic Murphy. Findings are nonspecific. If acute gallbladder disease remains a concern, further evaluation with hepatobiliary nuclear medicine imaging could be obtained Electronically Signed   By: Donavan Foil M.D.   On: 06/17/2021 20:09    EKG: Personally reviewed.  Rhythm is sinus tachycardia with heart rate of 105 bpm.  Left anterior fascicular block.  No dynamic ST segment changes appreciated.  Assessment/Plan Principal Problem:   Acute acalculous cholecystitis Patient presenting with worsening abdominal pain with CT findings concerning for worsening acalculous cholecystitis Lipase essentially normal, urinalysis remarkable Acalculous cholecystitis complicated by presence of multiple SIRS criteria and substantial lactic acidosis with possible impending sepsis considering concurrent organ dysfunction with renal injury Emergency department provider has already provided patient with 2000 cc of isotonic fluid boluses.  We will immediately halt any further volume resuscitation considering patient's markedly depressed ejection fraction. Providing patient with broad-spectrum intravenous antibiotic therapy with intravenous Zosyn ER provider has already discussed case with Dr. Barry Dienes with general surgery who recommends obtaining HIDA scan and contacting  surgery tomorrow once formal results have been obtained Blood cultures obtained Procalcitonin, CRP pending N.p.o. after midnight for HIDA scan in the morning  Active Problems:   Acute renal failure superimposed on stage 3b chronic kidney disease ([place])  Upon review of older labs, baseline creatinine of approximately 1.8-2.1 Creatinine currently now at 2.9 concerning for acute kidney injury superimposed on chronic kidney disease Patient is already received approximately 2000 cc of isotonic fluids by the emergency department staff however will immediately stop this due to concerns for possible volume overload in the setting of markedly depressed ejection fraction. Temporarily holding home regimen of diuretics Strict input and output monitoring Monitoring renal function and electrolytes closely with serial chemistries Avoiding nephrotoxic agents if at all possible.    Hyperkalemia  Patient presenting with mild hyperkalemia, likely secondary to diminished renal function Hyperkalemia should spontaneously resolve with hydration provided up to this point in the emergency department No evidence of EKG changes secondary to hyperkalemia Monitoring patient on telemetry Monitoring potassium levels with serial chemistries.    Lactic acidosis  Patient  presenting with lactic acidosis presumably secondary to underlying infection from suspected acalculous cholecystitis Providing patient with broad-spectrum antibiotics, patient has been hydrated with intravenous isotonic fluids Performing serial lactic acid levels to ensure downtrending and resolution.    Coronary artery disease involving native coronary artery of native heart without angina pectoris  Patient is currently chest pain-free Continue home regimen of aspirin and statin therapy Monitoring patient on telemetry    Chronic combined systolic and diastolic congestive heart failure ([place])  History advanced congestive heart failure with  ejection fraction of 20 to 25% No evidence of cardiogenic volume overload at this time Temporarily holding diuretics in setting of acute kidney injury superimposed on chronic kidney disease    Cocaine abuse ([place])  Patient admits to ongoing cocaine abuse Counseling patient on cessation daily.    COPD (chronic obstructive pulmonary disease) (HCC)  No evidence of COPD exacerbation at this time As needed Bronchodilator therapy for shortness of breath and wheezing.    Esophageal candidiasis (HCC)  Notable extensive oropharyngeal thrush on examination Patient also complaining of intermittent odynophagia Considering history of esophageal candidiasis we will place patient back on fluconazole 400 mg now followed by 200 mg daily for total of 14 days of treatment. If symptoms do not quickly resolve consider GI consultation  Code Status:  Full code Family Communication: deferred   Status is: Inpatient  Remains inpatient appropriate because:Ongoing diagnostic testing needed not appropriate for outpatient work up, IV treatments appropriate due to intensity of illness or inability to take PO, and Inpatient level of care appropriate due to severity of illness  Dispo: The patient is from: Home              Anticipated d/c is to: Home              Patient currently is not medically stable to d/c.   Difficult to place patient No        Vernelle Emerald MD [facility] Pager [phone]  If 7PM-7AM, please contact night-coverage [email or web address] Use universal [place] password for that web site. If you do not have the password, please call the hospital operator.  06/18/2021, 12:06 AM

## 2021-06-18 NOTE — Progress Notes (Signed)
PROGRESS NOTE    Tyler Wong  JJH:[id number] DOB: April 14, 1966 DOA: 06/17/2021 PCP: Tyler Perna, NP   Brief Narrative:  HPI On 06/17/2021 by Dr. 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  Patient explains that for the past several weeks he has been experiencing increasing abdominal pain.  Patient describes his abdominal pain is epigastric in location, radiating around the abdomen, severe in intensity, sharp in quality, worse with ingestion of foods and associated with decreased appetite and generalized weakness.  Patient complains of nausea but denies vomiting.  Patient denies fevers.  Patient denies dysuria or diarrhea.  Patient denies recent travel sick contacts or contacts with confirmed COVID-19 infection.   Patient was seen in oncology clinic on 6/20 in preparation for upcoming initiation of radiation therapy on 6/27 during this evaluation patient's symptoms prompted Dr.Sherrill to obtain CT imaging of the abdomen which revealed possible developing acute cholecystitis and therefore the patient was instructed on 6/21 to go to the emergency department for evaluation.     Upon evaluation in the emergency department patient was found to exhibit multiple SIRS criteria including tachycardia and tachypnea in setting of lactic acidosis of 2.6.  ER provider ordered 2 L of lactated Ringer solution bolus for the patient.  Patient was administered intravenous fentanyl for pain.  ER provider discussed case with Dr. Barry Wong with general surgery who recommended obtaining a HIDA scan, admission to  medicine and formal consultation of surgery if the HIDA scan is abnormal.  The hospitalist group was then called to assess the patient for admission to the hospital.  Interim history Patient presented with abdominal pain found to have acute acalculous cholecystitis.  HIDA scan currently pending.   Assessment & Plan   Severe sepsis secondary to acute acalculous cholecystitis -Present on admission, patient was noted to have tachycardia, tachypnea, with lactic acid of 5.7 -Patient presenting with worsening abdominal pain -CT abdomen pelvis findings concerning for acute  acalculous cholecystitis -Lipase within normal limits -UA unremarkable -Pending HIDA, if positive will consult general surgery  Acute kidney injury on chronic kidney disease, stage IIIb -Baseline creatinine approximate 1.8-2.1 -On admission, creatinine 2.92 -Was given IV fluids (will hold IVF given h/o CHF) -Continue monitor BMP  Lactic acidosis -Suspect secondary to a calculus cholecystitis -Continue broad-spectrum antibiotics -Patient did receive IV fluids -Will continue to monitor  Coronary artery disease -Patient currently chest pain-free -Continue aspirin, statin  Chronic combined systolic and diastolic heart failure -Appears to be euvolemic and compensated at this time -Mild LE edema bilaterally on examination -No shortness of breath -Echocardiogram 10/04/2020 shows an EF of 81-85%, grade 2 diastolic dysfunction -Monitor intake and output, daily weights  Hyperkalemia -Suspect secondary to renal function -No EKG changes -Currently 5.8- will order lokelma after HIDA scan -Continue BMP  Cocaine abuse -Patient counseled on cessation  COPD -No evidence of exacerbation at this time -Continue bronchodilator therapy as needed  Esophageal candidiasis -Patient with noted thrush on exam -Also complaining of odynophagia -Continue fluconazole for 14 days total -If symptoms do not improve, will consider GI  consultation  DVT Prophylaxis  lovenox  Code Status: Full  Family Communication: none at bedside  Disposition Plan:  Status is: Inpatient  Remains inpatient appropriate because:Ongoing diagnostic testing needed not appropriate for outpatient work up and IV treatments appropriate due to intensity of illness or inability to take PO  Dispo: The patient is from: Home              Anticipated d/c is to: Home              Patient currently is not medically stable to d/c.   Difficult to place patient No   Consultants General surgery by EDP  Procedures  None  Antibiotics   Anti-infectives (From admission, onward)    Start     Dose/Rate Route Frequency Ordered Stop   06/18/21 2200  fluconazole (DIFLUCAN) tablet 200 mg       See Hyperspace for full Linked Orders Report.   200 mg Oral Daily at bedtime 06/17/21 2348 07/01/21 2159   06/18/21 0600  piperacillin-tazobactam (ZOSYN) IVPB 3.375 g        3.375 g 12.5 mL/hr over 240 Minutes Intravenous Every 8 hours 06/18/21 0019     06/18/21 0030  piperacillin-tazobactam (ZOSYN) IVPB 3.375 g        3.375 g 100 mL/hr over 30 Minutes Intravenous  Once 06/18/21 0019 06/18/21 0309   06/18/21 0000  fluconazole (DIFLUCAN) tablet 400 mg       See Hyperspace for full Linked Orders Report.   400 mg Oral  Once 06/17/21 2348 06/18/21 0057       Subjective:   Tyler Wong seen and examined today.  Continues to have some abdominal pain in the upper portion.  Feels very thirsty.  Denies current chest pain, shortness of breath, current diarrhea or constipation, dizziness or headache.  Denies current nausea or vomiting.   Objective:   Vitals:   06/18/21 0719 06/18/21 0955 06/18/21 1100 06/18/21 1200  BP:  97/75 97/74 94/73   Pulse:  (!) 103 (!) 102 91  Resp:  (!) 22    Temp: 97.8 F (36.6 C)     TempSrc: Oral     SpO2:  97% 100% 100%  Weight:      Height:        Intake/Output Summary (Last 24 hours) at 06/18/2021 1311 Last data filed at  06/18/2021 1201 Gross per 24 hour  Intake 2100 ml  Output 600 ml  Net 1500 ml   Filed Weights   06/17/21 1939  Weight: 75 kg    Exam General: Well developed, well nourished, NAD, appears stated age 62: NCAT, mucous membranes moist.  Cardiovascular: S1 S2 auscultated, RRR Respiratory: Clear to auscultation bilaterally with equal chest rise Abdomen: Soft, epigastric/upper abdomen TTP, nondistended, + bowel sounds Extremities: warm dry without cyanosis clubbing.  Mild LE edema bilaterally Neuro: AAOx3, nonfocal Psych: Normal affect and demeanor with intact judgement and insight   Data Reviewed: I have personally reviewed following labs and imaging studies  CBC: Recent Labs  Lab 06/16/21 1122 06/17/21 1836 06/18/21 0543  WBC 9.3 8.4 11.5*  NEUTROABS 6.5 6.0 8.1*  HGB 13.2 12.7* 13.1  HCT 40.5 38.7* 40.6  MCV 81.2 79.8* 82.0  PLT 323 336 502   Basic Metabolic Panel: Recent Labs  Lab 06/16/21 1122 06/17/21 1836 06/18/21 0100  NA 132* 129* 131*  K 5.0 5.6* 5.8*  CL 92* 94* 97*  CO2 27 23 21*  GLUCOSE 71 135* 157*  BUN 59* 69* 66*  CREATININE 2.87* 2.92* 2.70*  CALCIUM 9.6 9.2 8.5*  MG  --   --  2.3  GFR: Estimated Creatinine Clearance: 31.9 mL/min (A) (by C-G formula based on SCr of 2.7 mg/dL (H)). Liver Function Tests: Recent Labs  Lab 06/16/21 1122 06/17/21 1836 06/18/21 0100  AST 36 47* 46*  ALT 31 40 38  ALKPHOS 145* 168* 139*  BILITOT 1.5* 2.0* 2.4*  PROT 7.3 7.1 6.4*  ALBUMIN 3.8 3.3* 3.0*   Recent Labs  Lab 06/17/21 1836  LIPASE 52*   No results for input(s): AMMONIA in the last 168 hours. Coagulation Profile: Recent Labs  Lab 06/17/21 1836 06/18/21 0543  INR 1.5* 1.7*   Cardiac Enzymes: No results for input(s): CKTOTAL, CKMB, CKMBINDEX, TROPONINI in the last 168 hours. BNP (last 3 results) No results for input(s): PROBNP in the last 8760 hours. HbA1C: No results for input(s): HGBA1C in the last 72 hours. CBG: No results for  input(s): GLUCAP in the last 168 hours. Lipid Profile: No results for input(s): CHOL, HDL, LDLCALC, TRIG, CHOLHDL, LDLDIRECT in the last 72 hours. Thyroid Function Tests: No results for input(s): TSH, T4TOTAL, FREET4, T3FREE, THYROIDAB in the last 72 hours. Anemia Panel: No results for input(s): VITAMINB12, FOLATE, FERRITIN, TIBC, IRON, RETICCTPCT in the last 72 hours. Urine analysis:    Component Value Date/Time   COLORURINE YELLOW 06/17/2021 2225   APPEARANCEUR CLEAR 06/17/2021 2225   LABSPEC 1.011 06/17/2021 2225   PHURINE 5.0 06/17/2021 2225   GLUCOSEU NEGATIVE 06/17/2021 2225   HGBUR NEGATIVE 06/17/2021 2225   BILIRUBINUR NEGATIVE 06/17/2021 [place] 06/17/2021 2225   PROTEINUR 100 (A) 06/17/2021 2225   NITRITE NEGATIVE 06/17/2021 2225   LEUKOCYTESUR NEGATIVE 06/17/2021 2225   Sepsis Labs: @LABRCNTIP (procalcitonin:4,lacticidven:4)  ) Recent Results (from the past 240 hour(s))  Blood Culture (routine x 2)     Status: None (Preliminary result)   Collection Time: 06/17/21  6:36 PM   Specimen: BLOOD  Result Value Ref Range Status   Specimen Description BLOOD RIGHT ANTECUBITAL  Final   Special Requests   Final    BOTTLES DRAWN AEROBIC ONLY Blood Culture results may not be optimal due to an inadequate volume of blood received in culture bottles   Culture   Final    NO GROWTH < 24 HOURS Performed at [facility] [address], [place], [place] [address]    Report Status PENDING  Incomplete  Blood Culture (routine x 2)     Status: None (Preliminary result)   Collection Time: 06/17/21  7:30 PM   Specimen: BLOOD  Result Value Ref Range Status   Specimen Description BLOOD BLOOD RIGHT FOREARM  Final   Special Requests   Final    BOTTLES DRAWN AEROBIC AND ANAEROBIC Blood Culture results may not be optimal due to an inadequate volume of blood received in culture bottles   Culture   Final    NO GROWTH < 12 HOURS Performed at [facility], [place] [address], [place], [place] [address]    Report Status PENDING  Incomplete  Resp Panel by RT-PCR (Flu A&B, Covid) Nasopharyngeal Swab     Status: None   Collection Time: 06/17/21  7:30 PM   Specimen: Nasopharyngeal Swab; Nasopharyngeal(NP) swabs in vial transport medium  Result Value Ref Range Status   SARS Coronavirus 2 by RT PCR NEGATIVE NEGATIVE Final    Comment: (NOTE) SARS-CoV-2 target nucleic acids are NOT DETECTED.  The SARS-CoV-2 RNA is generally detectable in upper respiratory specimens during the acute phase of infection. The lowest concentration of SARS-CoV-2 viral copies this assay  can detect is 138 copies/mL. A negative result does not preclude SARS-Cov-2 infection and should not be used as the sole basis for treatment or other patient management decisions. A negative result may occur with  improper specimen collection/handling, submission of specimen other than nasopharyngeal swab, presence of viral mutation(s) within the areas targeted by this assay, and inadequate number of viral copies(<138 copies/mL). A negative result must be combined with clinical observations, patient history, and epidemiological information. The expected result is Negative.  Fact Sheet for Patients:  EntrepreneurPulse.com.au  Fact Sheet for Healthcare Providers:  IncredibleEmployment.be  This test is no t yet approved or cleared by the [place] FDA and  has been authorized for detection and/or diagnosis of SARS-CoV-2 by FDA under an Emergency Use Authorization (EUA). This EUA will remain  in effect (meaning this test can be used) for the duration of the COVID-19 declaration under Section 564(b)(1) of the Act, 21 U.S.C.section 360bbb-3(b)(1), unless the authorization is terminated  or revoked sooner.       Influenza A by PCR NEGATIVE NEGATIVE Final   Influenza B by PCR NEGATIVE NEGATIVE Final    Comment: (NOTE) The Xpert Xpress SARS-CoV-2/FLU/RSV plus  assay is intended as an aid in the diagnosis of influenza from Nasopharyngeal swab specimens and should not be used as a sole basis for treatment. Nasal washings and aspirates are unacceptable for Xpert Xpress SARS-CoV-2/FLU/RSV testing.  Fact Sheet for Patients: EntrepreneurPulse.com.au  Fact Sheet for Healthcare Providers: IncredibleEmployment.be  This test is not yet approved or cleared by the [place] FDA and has been authorized for detection and/or diagnosis of SARS-CoV-2 by FDA under an Emergency Use Authorization (EUA). This EUA will remain in effect (meaning this test can be used) for the duration of the COVID-19 declaration under Section 564(b)(1) of the Act, 21 U.S.C. section 360bbb-3(b)(1), unless the authorization is terminated or revoked.  Performed at [facility], [place] [address] [place], [place] [address]       Radiology Studies: CT ABDOMEN PELVIS WO CONTRAST  Result Date: 06/17/2021 CLINICAL DATA:  Sepsis Abdominal pain, acute, nonlocalized. Dx with "abdominal" cancer and had PET scan yesterday and oncologist called him today and said he has an inflamed gal bladder and that he should go to the ED EXAM: CT ABDOMEN AND PELVIS WITHOUT CONTRAST TECHNIQUE: Multidetector CT imaging of the abdomen and pelvis was performed following the standard protocol without IV contrast. COMPARISON:  Ultrasound abdomen 06/17/2021, CT abdomen pelvis 06/16/2021, CT abdomen pelvis 02/20/2021, PET CT 11/28/2020 FINDINGS: Lower chest: Cardiomegaly.  Persistent trace pericardial effusion. Hepatobiliary: No focal liver abnormality. Gallbladder wall thickening and haziness. No CT evidence of a calcified stone. No biliary dilatation. Pancreas: No focal lesion. Normal pancreatic contour. No surrounding inflammatory changes. No main pancreatic ductal dilatation. Spleen: Normal in size without focal abnormality. Adrenals/Urinary Tract: No adrenal nodule  bilaterally. No nephrolithiasis, no hydronephrosis, and no contour-deforming renal mass. No ureterolithiasis or hydroureter. The urinary bladder is unremarkable. Stomach/Bowel: PO contrast reaches the rectum. Haziness as well as question of wall thickening of the distal duodenum with associated fat stranding and free fluid within the upper abdomen. Stomach is within normal limits. No evidence of large bowel wall thickening or dilatation. The appendix not definitely identified. Vascular/Lymphatic: No abdominal aorta or iliac aneurysm. Mild atherosclerotic plaque of the aorta and its branches. No abdominal, pelvic, or inguinal lymphadenopathy. Reproductive: Prostate is unremarkable. Other: No intraperitoneal free fluid. No intraperitoneal free gas. No organized fluid collection. Musculoskeletal: No abdominal wall hernia  or abnormality. No suspicious lytic or blastic osseous lesions. No acute displaced fracture. IMPRESSION: 1. Findings concerning for acalculous acute cholecystitis. Differential diagnosis of nonspecific stand alone gallbladder wall thickening includes chronic liver disease versus malignancy. A HIDA scan could be obtained for further evaluation of likely acalculous acute cholecystitis. 2. Haziness as well as question of wall thickening of the distal duodenum with associated fat stranding and free fluid within the upper abdomen. Finding may represent reactive changes versus duodenitis. 3. Given upper abdominal fat stranding in the vicinity of the pancreas, consider evaluation with lipase levels. 4. Trace simple free fluid ascites. 5. Markedly limited evaluation due to noncontrast study in a patient with known malignancy. Electronically Signed   By: Iven Finn M.D.   On: 06/17/2021 22:23   CT ABDOMEN PELVIS WO CONTRAST  Result Date: 06/17/2021 CLINICAL DATA:  Restaging gastric cancer, malignancy of pyloric antrum, ongoing chemotherapy EXAM: CT ABDOMEN AND PELVIS WITHOUT CONTRAST TECHNIQUE:  Multidetector CT imaging of the abdomen and pelvis was performed following the standard protocol without IV contrast. Oral enteric contrast was administered. COMPARISON:  02/20/2021 FINDINGS: Examination is generally limited by breath motion artifact and lack of intravenous contrast. Lower chest: No acute abnormality. Hepatobiliary: No solid liver abnormality is seen. The gallbladder wall appears thickened (series 2, image 29). No visible gallstones or biliary ductal dilatation. Pancreas: Unremarkable. No pancreatic ductal dilatation. Spleen: Normal in size without significant abnormality. Adrenals/Urinary Tract: Adrenal glands are unremarkable. Kidneys are normal, without renal calculi, solid lesion, or hydronephrosis. Bladder is unremarkable. Stomach/Bowel: No evidence of residual or recurrent mass of the gastric antrum (series 2, image 29). There is new, ill-defined fat stranding about the descending and transverse duodenum as well as the central small bowel mesentery (series 2, image 32, 37). Appendix appears normal. Vascular/Lymphatic: No significant vascular findings are present. No enlarged abdominal or pelvic lymph nodes. Reproductive: No mass or other significant abnormality. Other: No abdominal wall hernia or abnormality. New small volume ascites throughout the abdomen and pelvis. Musculoskeletal: No acute or significant osseous findings. IMPRESSION: 1. Examination is generally limited by breath motion artifact and lack of intravenous contrast. 2. Within this limitation, no evidence of residual or recurrent mass of the gastric antrum. 3. The gallbladder wall appears thickened. No visible gallstones or biliary ductal dilatation. Findings are concerning for acute cholecystitis. 4. There is new, ill-defined fat stranding about the descending and transverse duodenum as well as the central small bowel mesentery, likely reactive to findings of the adjacent gallbladder. 5. New small volume ascites throughout the  abdomen and pelvis. These results will be called to the ordering clinician or representative by the Radiologist Assistant, and communication documented in the PACS or [employer]. Electronically Signed   By: Eddie Candle M.D.   On: 06/17/2021 08:59   DG Chest Port 1 View  Result Date: 06/17/2021 CLINICAL DATA:  Nausea EXAM: PORTABLE CHEST 1 VIEW COMPARISON:  05/14/2021 FINDINGS: Cardiac shadow is enlarged but stable. Left chest wall port is again seen. Lungs are well aerated bilaterally. No focal infiltrate or sizable effusion is seen. No acute bony abnormality is noted. IMPRESSION: Stable cardiomegaly.  No focal infiltrate is seen. Electronically Signed   By: Inez Catalina M.D.   On: 06/17/2021 20:27   US Abdomen Limited RUQ (LIVER/GB)  Result Date: 06/17/2021 CLINICAL DATA:  Right upper quadrant pain EXAM: ULTRASOUND ABDOMEN LIMITED RIGHT UPPER QUADRANT COMPARISON:  CT 06/16/2021 FINDINGS: Gallbladder: No shadowing stones. Increased gallbladder wall thickness up to 6 mm. Negative  sonographic Percell Miller. Common bile duct: Diameter: 3.1 mm Liver: Echogenicity within normal limits. Shadowing calcification within the right hepatic lobe. Portal vein is patent on color Doppler imaging with normal direction of blood flow towards the liver. Other: None. IMPRESSION: 1. Gallbladder wall thickening without shadowing stone or sonographic Murphy. Findings are nonspecific. If acute gallbladder disease remains a concern, further evaluation with hepatobiliary nuclear medicine imaging could be obtained Electronically Signed   By: Donavan Foil M.D.   On: 06/17/2021 20:09     Scheduled Meds:  aspirin EC  81 mg Oral Daily   atorvastatin  80 mg Oral Daily   [START ON 06/19/2021] empagliflozin  10 mg Oral Daily   enoxaparin (LOVENOX) injection  30 mg Subcutaneous Q24H   fluconazole  200 mg Oral QHS   gabapentin  100 mg Oral BID   pantoprazole  40 mg Oral BID AC   Continuous Infusions:  piperacillin-tazobactam  (ZOSYN)  IV Stopped (06/18/21 1010)     LOS: 1 day   Time Spent in minutes   45 minutes  Kristilyn Coltrane D.O. on 06/18/2021 at 1:11 PM  Between 7am to 7pm - Please see pager noted on [email or web address]  After 7pm go to [email or web address]  And look for the night coverage person covering for me after hours  [facility] Office  [phone]

## 2021-06-18 NOTE — ED Notes (Signed)
Spoke with NM. Pt will have procedure at 1200 and is not to receive opiates until after the procedure.

## 2021-06-18 NOTE — ED Notes (Signed)
Sister Peter Congo at bedside.  Pt OTF for procedure.  Sister updated.

## 2021-06-19 ENCOUNTER — Other Ambulatory Visit (HOSPITAL_COMMUNITY): Payer: Medicaid Other

## 2021-06-19 ENCOUNTER — Inpatient Hospital Stay (HOSPITAL_COMMUNITY): Payer: Medicaid Other

## 2021-06-19 DIAGNOSIS — K810 Acute cholecystitis: Principal | ICD-10-CM

## 2021-06-19 LAB — CBC
HCT: 32.9 % — ABNORMAL LOW (ref 39.0–52.0)
Hemoglobin: 11.1 g/dL — ABNORMAL LOW (ref 13.0–17.0)
MCH: 26.4 pg (ref 26.0–34.0)
MCHC: 33.7 g/dL (ref 30.0–36.0)
MCV: 78.1 fL — ABNORMAL LOW (ref 80.0–100.0)
Platelets: 308 10*3/uL (ref 150–400)
RBC: 4.21 MIL/uL — ABNORMAL LOW (ref 4.22–5.81)
RDW: 17.3 % — ABNORMAL HIGH (ref 11.5–15.5)
WBC: 12.4 10*3/uL — ABNORMAL HIGH (ref 4.0–10.5)
nRBC: 0.6 % — ABNORMAL HIGH (ref 0.0–0.2)

## 2021-06-19 LAB — COMPREHENSIVE METABOLIC PANEL
ALT: 122 U/L — ABNORMAL HIGH (ref 0–44)
AST: 155 U/L — ABNORMAL HIGH (ref 15–41)
Albumin: 2.7 g/dL — ABNORMAL LOW (ref 3.5–5.0)
Alkaline Phosphatase: 142 U/L — ABNORMAL HIGH (ref 38–126)
Anion gap: 13 (ref 5–15)
BUN: 83 mg/dL — ABNORMAL HIGH (ref 6–20)
CO2: 23 mmol/L (ref 22–32)
Calcium: 8.8 mg/dL — ABNORMAL LOW (ref 8.9–10.3)
Chloride: 92 mmol/L — ABNORMAL LOW (ref 98–111)
Creatinine, Ser: 3.42 mg/dL — ABNORMAL HIGH (ref 0.61–1.24)
GFR, Estimated: 20 mL/min — ABNORMAL LOW (ref >60)
Glucose, Bld: 146 mg/dL — ABNORMAL HIGH (ref 70–99)
Potassium: 6.1 mmol/L — ABNORMAL HIGH (ref 3.5–5.1)
Sodium: 128 mmol/L — ABNORMAL LOW (ref 135–145)
Total Bilirubin: 3.4 mg/dL — ABNORMAL HIGH (ref 0.3–1.2)
Total Protein: 6.2 g/dL — ABNORMAL LOW (ref 6.5–8.1)

## 2021-06-19 LAB — OSMOLALITY: Osmolality: 304 mOsm/kg — ABNORMAL HIGH (ref 275–295)

## 2021-06-19 LAB — OSMOLALITY, URINE: Osmolality, Ur: 470 mOsm/kg (ref 300–900)

## 2021-06-19 LAB — HEPATITIS B SURFACE ANTIGEN: Hepatitis B Surface Ag: NONREACTIVE

## 2021-06-19 LAB — CREATININE, URINE, RANDOM: Creatinine, Urine: 74.86 mg/dL

## 2021-06-19 LAB — LACTIC ACID, PLASMA: Lactic Acid, Venous: 2.6 mmol/L (ref 0.5–1.9)

## 2021-06-19 LAB — LIPASE, BLOOD: Lipase: 45 U/L (ref 11–51)

## 2021-06-19 LAB — SODIUM, URINE, RANDOM: Sodium, Ur: <10 mmol/L

## 2021-06-19 MED ORDER — SODIUM CHLORIDE 0.9 % IV SOLN: INTRAVENOUS | Status: DC

## 2021-06-19 MED ORDER — SODIUM ZIRCONIUM CYCLOSILICATE 10 G PO PACK
10.0000 g | PACK | Freq: Two times a day (BID) | ORAL | Status: DC
  Administered 2021-06-20: 10 g via ORAL
  Filled 2021-06-19 (×3): qty 1

## 2021-06-19 MED ORDER — SODIUM ZIRCONIUM CYCLOSILICATE 10 G PO PACK
10.0000 g | PACK | Freq: Two times a day (BID) | ORAL | Status: AC
  Administered 2021-06-19 (×2): 10 g via ORAL
  Filled 2021-06-19 (×2): qty 1

## 2021-06-19 MED ORDER — ENOXAPARIN SODIUM 30 MG/0.3ML IJ SOSY
30.0000 mg | PREFILLED_SYRINGE | INTRAMUSCULAR | Status: DC
  Administered 2021-06-20: 30 mg via SUBCUTANEOUS
  Filled 2021-06-19: qty 0.3

## 2021-06-19 NOTE — Consult Note (Signed)
Referring Provider: [facility] Primary Care Physician:  [name], NP Primary Gastroenterologist:  [facility]  Reason for Consultation:  Epigastric abdominal pain, abnormal LFTs, history of gastric adenocarcinoma  HPI: Tyler Wong is a 55 y.o. male is a 55 year old male with history of gastric cancer, CHF (EF 20-25% as of 09/2020), CKD presenting for consultation of epigastric abdominal pain and abnormal LFTs.  Patient reports worsening upper abdominal pain over the past month.  Pain is typically worsened by eating.  He also reports melena for the past 2-3 weeks. Denies hematochezia.  Reports some nausea but denies vomiting or dysphagia.  Reports alternating intermittent diarrhea and constipation.  He thinks he may have had some weight loss but is not entirely sure. Reports decreased appetite.  Takes 81 mg ASA daily. Occasionally takes ibuprofen PRN but not daily.  He underwent EGD 10/2020 which revealed non-bleeding gastric ulcers with pigmented material. 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 He subsequently completed 6 cycles of chemotherapy for gastric cancer.  He was scheduled for restaging EGD but did not come to the appointment.  He denies family history of colon cancer or gastrointestinal malignancy.  He has never had a colonoscopy (previously scheduled but cancelled as he did not complete prep).  Prior Hepatitis C documented in chart, unclear if he underwent treatment.  Past Medical History:  Diagnosis Date   CAD in native artery 07/17/14   STEMI- LAD stenosis with DES   CHF (congestive heart failure) (HCC)    Cocaine abuse ([place])    COPD (chronic obstructive pulmonary disease) (HCC)    Dyspnea    Gastric cancer ([place]) 11/01/2020   GERD (gastroesophageal reflux disease)    Hypertension    ST elevation myocardial infarction (STEMI) involving left anterior descending (LAD) coronary artery with complication ([place])    Tobacco  abuse 07/17/2014    Past Surgical History:  Procedure Laterality Date   BIOPSY  11/05/2020   Procedure: BIOPSY;  Surgeon: Wilford Corner, MD;  Location: [facility];  Service: Endoscopy;;   CORONARY ANGIOPLASTY WITH STENT PLACEMENT  07/18/14   resolute DES to LAD STEMI   ESOPHAGOGASTRODUODENOSCOPY N/A 11/05/2020   Procedure: ESOPHAGOGASTRODUODENOSCOPY (EGD);  Surgeon: Wilford Corner, MD;  Location: [facility];  Service: Endoscopy;  Laterality: N/A;   IR IMAGING GUIDED PORT INSERTION  12/06/2020   LEFT HEART CATH N/A 07/17/2014   Procedure: LEFT HEART CATH;  Surgeon: [name], MD;  Location: [facility] CATH LAB;  Service: Cardiovascular;  Laterality: N/A;   PERCUTANEOUS CORONARY STENT INTERVENTION (PCI-S)  07/17/2014   Procedure: PERCUTANEOUS CORONARY STENT INTERVENTION (PCI-S);  Surgeon: [name], MD;  Location: [facility] CATH LAB;  Service: Cardiovascular;;   RIGHT/LEFT HEART CATH AND CORONARY ANGIOGRAPHY N/A 10/03/2018   Procedure: RIGHT/LEFT HEART CATH AND CORONARY ANGIOGRAPHY;  Surgeon: [name], MD;  Location: [facility] CV LAB;  Service: Cardiovascular;  Laterality: N/A;    Prior to Admission medications   Medication Sig Start Date End Date Taking? Authorizing Provider  acidophilus (RISAQUAD) CAPS capsule Take 1 capsule by mouth daily.   Yes [provider]  albuterol (VENTOLIN HFA) 108 (90 Base) MCG/ACT inhaler Inhale 2 puffs into the lungs every 4 (four) hours as needed for wheezing or shortness of breath. 06/04/21  Yes Mayers, Cari S, PA-C  ASPIRIN LOW DOSE 81 MG EC tablet TAKE 1 TABLET (81 MG TOTAL) BY MOUTH DAILY (MORNING) Patient taking differently: Take 81 mg by mouth in the morning. 03/05/21  Yes  Bensimhon, Shaune Pascal, MD  atorvastatin (LIPITOR) 80 MG tablet Take 1 tablet (80 mg total) by mouth daily. 11/26/20  Yes [name], MD  empagliflozin (JARDIANCE) 10 MG TABS tablet Take 1 tablet (10 mg total) by mouth daily. 11/26/20  Yes [name], MD   gabapentin (NEURONTIN) 100 MG capsule Take 1 capsule (100 mg total) by mouth 2 (two) times daily. 05/21/21  Yes Nita Sells, MD  Multiple Vitamins-Minerals (MULTIVITAMIN WITH MINERALS) tablet Take 1 tablet by mouth daily.   Yes [provider]  nicotine (NICODERM CQ - DOSED IN MG/24 HR) 7 mg/24hr patch Place 1 patch (7 mg total) onto the skin daily. 05/22/21  Yes Nita Sells, MD  pantoprazole (PROTONIX) 40 MG tablet Take 1 tablet (40 mg total) by mouth 2 (two) times daily. 05/22/21 06/21/21 Yes Ladell Pier, MD  polyethylene glycol (MIRALAX / GLYCOLAX) 17 g packet Take 17 g by mouth 2 (two) times daily. 05/22/21  Yes Ladell Pier, MD  potassium chloride SA (KLOR-CON) 20 MEQ tablet Take 2 tablets (40 mEq total) by mouth daily. 01/20/21  Yes [name], MD  Torsemide 40 MG TABS Take 80 mg by mouth in the morning AND 40 mg every evening. 06/11/21  Yes Milford, Maricela Bo, FNP    Scheduled Meds:  aspirin EC  81 mg Oral Daily   atorvastatin  80 mg Oral Daily   enoxaparin (LOVENOX) injection  40 mg Subcutaneous Q24H   fluconazole  200 mg Oral QHS   gabapentin  100 mg Oral BID   pantoprazole  40 mg Oral BID AC   sodium zirconium cyclosilicate  10 g Oral BID   [START ON 06/20/2021] sodium zirconium cyclosilicate  10 g Oral BID   Continuous Infusions:  sodium chloride 75 mL/hr at 06/19/21 1057   piperacillin-tazobactam (ZOSYN)  IV 3.375 g (06/19/21 0522)   PRN Meds:.acetaminophen **OR** acetaminophen, albuterol, fentaNYL (SUBLIMAZE) injection **OR** fentaNYL (SUBLIMAZE) injection, ondansetron **OR** ondansetron (ZOFRAN) IV, polyethylene glycol  Allergies as of 06/17/2021   (No Known Allergies)    Family History  Problem Relation Age of Onset   Cirrhosis Father    Heart attack Mother    Heart attack Sister    Heart failure Sister    Heart attack Brother     Social History   Socioeconomic History   Marital status: Single    Spouse name: Not on file    Number of children: 2   Years of education: 12   Highest education level: 12th grade  Occupational History   Occupation: Landscaping  Tobacco Use   Smoking status: Every Day    Packs/day: 0.25    Years: 30.00    Pack years: 7.50    Types: Cigarettes   Smokeless tobacco: Never   Tobacco comments:    .5 PK PER DAY  Vaping Use   Vaping Use: Never used  Substance and Sexual Activity   Alcohol use: No    Comment: Patient denies abuse, states social drinker   Drug use: Yes    Types: Cocaine, "Crack" cocaine    Comment: heroin   Sexual activity: Yes  Other Topics Concern   Not on file  Social History Narrative   Lives in [place] with his sister and girlfriend. He works Aeronautical engineer work.    Social Determinants of Health   Financial Resource Strain: High Risk   Difficulty of Paying Living Expenses: Very hard  Food Insecurity: Food Insecurity Present   Worried About  Running Out of Food in the Last Year: Sometimes true   Ran Out of Food in the Last Year: Sometimes true  Transportation Needs: No Transportation Needs   Lack of Transportation (Medical): No   Lack of Transportation (Non-Medical): No  Physical Activity: Not on file  Stress: Not on file  Social Connections: Not on file  Intimate Partner Violence: Not At Risk   Fear of Current or Ex-Partner: No   Emotionally Abused: No   Physically Abused: No   Sexually Abused: No    Review of Systems: Review of Systems  Constitutional:  Positive for malaise/fatigue. Negative for chills and fever.  HENT:  Negative for hearing loss and tinnitus.   Eyes:  Negative for pain and redness.  Respiratory:  Negative for cough and shortness of breath.   Cardiovascular:  Negative for chest pain and palpitations.  Gastrointestinal:  Positive for abdominal pain, constipation, diarrhea, melena and nausea. Negative for blood in stool, heartburn and vomiting.  Genitourinary:  Negative for flank pain and hematuria.   Musculoskeletal:  Negative for falls and joint pain.  Skin:  Negative for itching and rash.  Neurological:  Negative for seizures and loss of consciousness.  Endo/Heme/Allergies:  Negative for polydipsia. Does not bruise/bleed easily.  Psychiatric/Behavioral:  Positive for substance abuse (cocaine). The patient is not nervous/anxious.     Physical Exam: Vital signs: Vitals:   06/19/21 0528 06/19/21 0849  BP: 99/81 95/76  Pulse: 100 100  Resp: 17 18  Temp: 98.1 F (36.7 C) 97.6 F (36.4 C)  SpO2: 98% 96%   Last BM Date:  (patient can't remember)  Physical Exam Vitals reviewed.  Constitutional:      General: He is sleeping. He is not in acute distress. HENT:     Head: Normocephalic and atraumatic.     Nose: Nose normal. No congestion.     Mouth/Throat:     Mouth: Mucous membranes are moist.     Pharynx: Oropharynx is clear.  Eyes:     General: Scleral icterus present.     Extraocular Movements: Extraocular movements intact.  Cardiovascular:     Rate and Rhythm: Normal rate and regular rhythm.  Pulmonary:     Effort: Pulmonary effort is normal. No respiratory distress.  Abdominal:     General: Bowel sounds are normal. There is no distension.     Palpations: Abdomen is soft.     Tenderness: There is abdominal tenderness in the epigastric area. There is no guarding or rebound.     Hernia: No hernia is present.  Musculoskeletal:        General: No swelling or tenderness.     Cervical back: Normal range of motion and neck supple.  Skin:    General: Skin is warm and dry.  Neurological:     General: No focal deficit present.     Mental Status: He is oriented to person, place, and time. He is lethargic.  Psychiatric:        Mood and Affect: Mood normal.        Behavior: Behavior normal. Behavior is cooperative.    GI:  Lab Results: Recent Labs    06/17/21 1836 06/18/21 0543 06/19/21 0140  WBC 8.4 11.5* 12.4*  HGB 12.7* 13.1 11.1*  HCT 38.7* 40.6 32.9*  PLT  336 339 308   BMET Recent Labs    06/17/21 1836 06/18/21 0100 06/19/21 0140  NA 129* 131* 128*  K 5.6* 5.8* 6.1*  CL 94* 97* 92*  CO2  23 21* 23  GLUCOSE 135* 157* 146*  BUN 69* 66* 83*  CREATININE 2.92* 2.70* 3.42*  CALCIUM 9.2 8.5* 8.8*   LFT Recent Labs    06/19/21 0140  PROT 6.2*  ALBUMIN 2.7*  AST 155*  ALT 122*  ALKPHOS 142*  BILITOT 3.4*   PT/INR Recent Labs    06/17/21 1836 06/18/21 0543  LABPROT 17.7* 19.9*  INR 1.5* 1.7*     Studies/Results: CT ABDOMEN PELVIS WO CONTRAST  Result Date: 06/17/2021 CLINICAL DATA:  Sepsis Abdominal pain, acute, nonlocalized. Dx with "abdominal" cancer and had PET scan yesterday and oncologist called him today and said he has an inflamed gal bladder and that he should go to the ED EXAM: CT ABDOMEN AND PELVIS WITHOUT CONTRAST TECHNIQUE: Multidetector CT imaging of the abdomen and pelvis was performed following the standard protocol without IV contrast. COMPARISON:  Ultrasound abdomen 06/17/2021, CT abdomen pelvis 06/16/2021, CT abdomen pelvis 02/20/2021, PET CT 11/28/2020 FINDINGS: Lower chest: Cardiomegaly.  Persistent trace pericardial effusion. Hepatobiliary: No focal liver abnormality. Gallbladder wall thickening and haziness. No CT evidence of a calcified stone. No biliary dilatation. Pancreas: No focal lesion. Normal pancreatic contour. No surrounding inflammatory changes. No main pancreatic ductal dilatation. Spleen: Normal in size without focal abnormality. Adrenals/Urinary Tract: No adrenal nodule bilaterally. No nephrolithiasis, no hydronephrosis, and no contour-deforming renal mass. No ureterolithiasis or hydroureter. The urinary bladder is unremarkable. Stomach/Bowel: PO contrast reaches the rectum. Haziness as well as question of wall thickening of the distal duodenum with associated fat stranding and free fluid within the upper abdomen. Stomach is within normal limits. No evidence of large bowel wall thickening or dilatation.  The appendix not definitely identified. Vascular/Lymphatic: No abdominal aorta or iliac aneurysm. Mild atherosclerotic plaque of the aorta and its branches. No abdominal, pelvic, or inguinal lymphadenopathy. Reproductive: Prostate is unremarkable. Other: No intraperitoneal free fluid. No intraperitoneal free gas. No organized fluid collection. Musculoskeletal: No abdominal wall hernia or abnormality. No suspicious lytic or blastic osseous lesions. No acute displaced fracture. IMPRESSION: 1. Findings concerning for acalculous acute cholecystitis. Differential diagnosis of nonspecific stand alone gallbladder wall thickening includes chronic liver disease versus malignancy. A HIDA scan could be obtained for further evaluation of likely acalculous acute cholecystitis. 2. Haziness as well as question of wall thickening of the distal duodenum with associated fat stranding and free fluid within the upper abdomen. Finding may represent reactive changes versus duodenitis. 3. Given upper abdominal fat stranding in the vicinity of the pancreas, consider evaluation with lipase levels. 4. Trace simple free fluid ascites. 5. Markedly limited evaluation due to noncontrast study in a patient with known malignancy. Electronically Signed   By: Iven Finn M.D.   On: 06/17/2021 22:23   NM Hepatobiliary Liver Func  Result Date: 06/18/2021 CLINICAL DATA:  Elevated bilirubin: 2.4 (06/18/21) HPI: 56 year old male with past medical history of gastric cancer (Dx 10/2010), chronic kidney disease stage IIIb, EXAM: NUCLEAR MEDICINE HEPATOBILIARY IMAGING TECHNIQUE: Sequential images of the abdomen were obtained out to 60 minutes following intravenous administration of radiopharmaceutical. RADIOPHARMACEUTICALS:  7.6 mCi Tc-9m  Choletec IV COMPARISON:  CT 621 22 FINDINGS: Prompt uptake and biliary excretion of activity by the liver is seen. Gallbladder activity is visualized, consistent with patency of cystic duct. Biliary activity passes  into small bowel, consistent with patent common bile duct. IMPRESSION: 1. Patent cystic duct and common bile duct. 2. No evidence acute cholecystitis. Electronically Signed   By: Suzy Bouchard M.D.   On: 06/18/2021 14:42  DG Chest Port 1 View  Result Date: 06/17/2021 CLINICAL DATA:  Nausea EXAM: PORTABLE CHEST 1 VIEW COMPARISON:  05/14/2021 FINDINGS: Cardiac shadow is enlarged but stable. Left chest wall port is again seen. Lungs are well aerated bilaterally. No focal infiltrate or sizable effusion is seen. No acute bony abnormality is noted. IMPRESSION: Stable cardiomegaly.  No focal infiltrate is seen. Electronically Signed   By: Inez Catalina M.D.   On: 06/17/2021 20:27   US Abdomen Limited RUQ (LIVER/GB)  Result Date: 06/17/2021 CLINICAL DATA:  Right upper quadrant pain EXAM: ULTRASOUND ABDOMEN LIMITED RIGHT UPPER QUADRANT COMPARISON:  CT 06/16/2021 FINDINGS: Gallbladder: No shadowing stones. Increased gallbladder wall thickness up to 6 mm. Negative sonographic Murphy. Common bile duct: Diameter: 3.1 mm Liver: Echogenicity within normal limits. Shadowing calcification within the right hepatic lobe. Portal vein is patent on color Doppler imaging with normal direction of blood flow towards the liver. Other: None. IMPRESSION: 1. Gallbladder wall thickening without shadowing stone or sonographic Murphy. Findings are nonspecific. If acute gallbladder disease remains a concern, further evaluation with hepatobiliary nuclear medicine imaging could be obtained Electronically Signed   By: Donavan Foil M.D.   On: 06/17/2021 20:09    Impression: Abnormal LFTs: Unclear etiology.  Underlying Hepatitis C, shock liver/reactive hepatopathy, possible acalculous cholecystitis (though not likely cause of elevated T. Bili) -T. Bili 3.4/ AST 155/ ALT 122/ ALP 142 -INR 1.7 -CT 06/17/21: Findings concerning for acalculous acute cholecystitis. Normal HIDA  Epigastric abdominal pain, unclear if this is due to biliary  etiology of gastric etiology -CT revealed haziness as well as question of wall thickening of the distal duodenum with associated fat stranding and free fluid within the upper abdomen. Finding may represent reactive changes versus duodenitis.  History of gastric adenocarcinoma, melena -Hgb 11.1  History of Hepatitis C (unclear if he underwent treatment)  AKI on CKD: BUN 83/ Cr 3.42  Plan: MRI/MRCP ordered, pending.  Recommend proceeding with EGD tomorrow, given history of gastric adenocarcinoma, as well as current epigastric abdominal pain and melena.  I thoroughly discussed the procedure with the patient to include nature, alternatives, benefits, and risks (including but not limited to bleeding, infection, perforation, anesthesia/cardiac and pulmonary complications).  Patient verbalized understanding and gave verbal consent to proceed with EGD.  Soft diet OK after MRI/MRCP.  NPO after midnight.  Recheck HCV PCR to evaluate for active Hepatitis C.  [facility] will follow.    LOS: 2 days   Salley Slaughter  PA-C 06/19/2021, 11:57 AM  Contact #  [phone]

## 2021-06-19 NOTE — Progress Notes (Signed)
[facility]) Hospital Liaison note:  This patient is currently enrolled in [facility] outpatient-based Palliative Care. Will continue to follow for disposition.  Please call with any outpatient palliative questions or concerns.  Thank you, Lorelee Market, LPN [facility] Liaison [phone]

## 2021-06-19 NOTE — Progress Notes (Addendum)
HEMATOLOGY-ONCOLOGY PROGRESS NOTE  SUBJECTIVE: Tyler Wong is followed by our practice for gastric cancer.  He was last seen in our office on 06/16/2021 and was having increased abdominal discomfort and bloating.  He had a restaging CT scan of the abdomen pelvis which showed that the gallbladder wall was thickened and findings were concerning for acute cholecystitis.  Due to his findings, the patient was referred to the emergency department for further evaluation.  He underwent HIDA scan which showed patent cystic duct and common bile duct and no evidence of acute cholecystitis.  Today, the patient continues to have abdominal discomfort and distention.  He does not have any nausea or vomiting at this time.  Oncology History  Gastric cancer ([place])  11/29/2020 Initial Diagnosis   Gastric cancer ([place])    12/12/2020 -  Chemotherapy    Patient is on Treatment Plan: GASTROESOPHAGEAL FOLFOX Q14D X 6 CYCLES        PHYSICAL EXAMINATION:  Vitals:   06/18/21 1958 06/19/21 0528  BP: 112/72 99/81  Pulse:  100  Resp: 18 17  Temp: 98.1 F (36.7 C) 98.1 F (36.7 C)  SpO2: 97% 98%   Filed Weights   06/17/21 1939  Weight: 75 kg    Intake/Output from previous day: 06/22 0701 - 06/23 0700 In: 50 [IV Piggyback:50] Out: 600 [Urine:600]  GENERAL: Sitting up on side of bed, no distress SKIN: skin color, texture, turgor are normal, no rashes or significant lesions EYES: normal, Conjunctiva are pink and non-injected, sclera clear LUNGS: clear to auscultation and percussion with normal breathing effort HEART: regular rate & rhythm and no murmurs and no lower extremity edema ABDOMEN: Positive bowel sounds, distended, tenderness with palpation NEURO: alert & oriented x 3 with fluent speech, no focal motor/sensory deficits  LABORATORY DATA:  I have reviewed the data as listed CMP Latest Ref Rng & Units 06/19/2021 06/18/2021 06/17/2021  Glucose 70 - 99 mg/dL 146(H) 157(H) 135(H)  BUN 6 - 20 mg/dL  83(H) 66(H) 69(H)  Creatinine 0.61 - 1.24 mg/dL 3.42(H) 2.70(H) 2.92(H)  Sodium 135 - 145 mmol/L 128(L) 131(L) 129(L)  Potassium 3.5 - 5.1 mmol/L 6.1(H) 5.8(H) 5.6(H)  Chloride 98 - 111 mmol/L 92(L) 97(L) 94(L)  CO2 22 - 32 mmol/L 23 21(L) 23  Calcium 8.9 - 10.3 mg/dL 8.8(L) 8.5(L) 9.2  Total Protein 6.5 - 8.1 g/dL 6.2(L) 6.4(L) 7.1  Total Bilirubin 0.3 - 1.2 mg/dL 3.4(H) 2.4(H) 2.0(H)  Alkaline Phos 38 - 126 U/L 142(H) 139(H) 168(H)  AST 15 - 41 U/L 155(H) 46(H) 47(H)  ALT 0 - 44 U/L 122(H) 38 40    Lab Results  Component Value Date   WBC 12.4 (H) 06/19/2021   HGB 11.1 (L) 06/19/2021   HCT 32.9 (L) 06/19/2021   MCV 78.1 (L) 06/19/2021   PLT 308 06/19/2021   NEUTROABS 8.1 (H) 06/18/2021    CT ABDOMEN PELVIS WO CONTRAST  Result Date: 06/17/2021 CLINICAL DATA:  Sepsis Abdominal pain, acute, nonlocalized. Dx with "abdominal" cancer and had PET scan yesterday and oncologist called him today and said he has an inflamed gal bladder and that he should go to the ED EXAM: CT ABDOMEN AND PELVIS WITHOUT CONTRAST TECHNIQUE: Multidetector CT imaging of the abdomen and pelvis was performed following the standard protocol without IV contrast. COMPARISON:  Ultrasound abdomen 06/17/2021, CT abdomen pelvis 06/16/2021, CT abdomen pelvis 02/20/2021, PET CT 11/28/2020 FINDINGS: Lower chest: Cardiomegaly.  Persistent trace pericardial effusion. Hepatobiliary: No focal liver abnormality. Gallbladder wall thickening and haziness. No  CT evidence of a calcified stone. No biliary dilatation. Pancreas: No focal lesion. Normal pancreatic contour. No surrounding inflammatory changes. No main pancreatic ductal dilatation. Spleen: Normal in size without focal abnormality. Adrenals/Urinary Tract: No adrenal nodule bilaterally. No nephrolithiasis, no hydronephrosis, and no contour-deforming renal mass. No ureterolithiasis or hydroureter. The urinary bladder is unremarkable. Stomach/Bowel: PO contrast reaches the rectum.  Haziness as well as question of wall thickening of the distal duodenum with associated fat stranding and free fluid within the upper abdomen. Stomach is within normal limits. No evidence of large bowel wall thickening or dilatation. The appendix not definitely identified. Vascular/Lymphatic: No abdominal aorta or iliac aneurysm. Mild atherosclerotic plaque of the aorta and its branches. No abdominal, pelvic, or inguinal lymphadenopathy. Reproductive: Prostate is unremarkable. Other: No intraperitoneal free fluid. No intraperitoneal free gas. No organized fluid collection. Musculoskeletal: No abdominal wall hernia or abnormality. No suspicious lytic or blastic osseous lesions. No acute displaced fracture. IMPRESSION: 1. Findings concerning for acalculous acute cholecystitis. Differential diagnosis of nonspecific stand alone gallbladder wall thickening includes chronic liver disease versus malignancy. A HIDA scan could be obtained for further evaluation of likely acalculous acute cholecystitis. 2. Haziness as well as question of wall thickening of the distal duodenum with associated fat stranding and free fluid within the upper abdomen. Finding may represent reactive changes versus duodenitis. 3. Given upper abdominal fat stranding in the vicinity of the pancreas, consider evaluation with lipase levels. 4. Trace simple free fluid ascites. 5. Markedly limited evaluation due to noncontrast study in a patient with known malignancy. Electronically Signed   By: Iven Finn M.D.   On: 06/17/2021 22:23   CT ABDOMEN PELVIS WO CONTRAST  Result Date: 06/17/2021 CLINICAL DATA:  Restaging gastric cancer, malignancy of pyloric antrum, ongoing chemotherapy EXAM: CT ABDOMEN AND PELVIS WITHOUT CONTRAST TECHNIQUE: Multidetector CT imaging of the abdomen and pelvis was performed following the standard protocol without IV contrast. Oral enteric contrast was administered. COMPARISON:  02/20/2021 FINDINGS: Examination is generally  limited by breath motion artifact and lack of intravenous contrast. Lower chest: No acute abnormality. Hepatobiliary: No solid liver abnormality is seen. The gallbladder wall appears thickened (series 2, image 29). No visible gallstones or biliary ductal dilatation. Pancreas: Unremarkable. No pancreatic ductal dilatation. Spleen: Normal in size without significant abnormality. Adrenals/Urinary Tract: Adrenal glands are unremarkable. Kidneys are normal, without renal calculi, solid lesion, or hydronephrosis. Bladder is unremarkable. Stomach/Bowel: No evidence of residual or recurrent mass of the gastric antrum (series 2, image 29). There is new, ill-defined fat stranding about the descending and transverse duodenum as well as the central small bowel mesentery (series 2, image 32, 37). Appendix appears normal. Vascular/Lymphatic: No significant vascular findings are present. No enlarged abdominal or pelvic lymph nodes. Reproductive: No mass or other significant abnormality. Other: No abdominal wall hernia or abnormality. New small volume ascites throughout the abdomen and pelvis. Musculoskeletal: No acute or significant osseous findings. IMPRESSION: 1. Examination is generally limited by breath motion artifact and lack of intravenous contrast. 2. Within this limitation, no evidence of residual or recurrent mass of the gastric antrum. 3. The gallbladder wall appears thickened. No visible gallstones or biliary ductal dilatation. Findings are concerning for acute cholecystitis. 4. There is new, ill-defined fat stranding about the descending and transverse duodenum as well as the central small bowel mesentery, likely reactive to findings of the adjacent gallbladder. 5. New small volume ascites throughout the abdomen and pelvis. These results will be called to the ordering clinician or representative by  the Psychologist, clinical, and communication documented in the PACS or [employer]. Electronically Signed   By: Eddie Candle M.D.   On: 06/17/2021 08:59   NM Hepatobiliary Liver Func  Result Date: 06/18/2021 CLINICAL DATA:  Elevated bilirubin: 2.4 (06/18/21) HPI: 55 year old male with past medical history of gastric cancer (Dx 10/2010), chronic kidney disease stage IIIb, EXAM: NUCLEAR MEDICINE HEPATOBILIARY IMAGING TECHNIQUE: Sequential images of the abdomen were obtained out to 60 minutes following intravenous administration of radiopharmaceutical. RADIOPHARMACEUTICALS:  7.6 mCi Tc-21m Choletec IV COMPARISON:  CT 621 22 FINDINGS: Prompt uptake and biliary excretion of activity by the liver is seen. Gallbladder activity is visualized, consistent with patency of cystic duct. Biliary activity passes into small bowel, consistent with patent common bile duct. IMPRESSION: 1. Patent cystic duct and common bile duct. 2. No evidence acute cholecystitis. Electronically Signed   By: SSuzy BouchardM.D.   On: 06/18/2021 14:42   DG Chest Port 1 View  Result Date: 06/17/2021 CLINICAL DATA:  Nausea EXAM: PORTABLE CHEST 1 VIEW COMPARISON:  05/14/2021 FINDINGS: Cardiac shadow is enlarged but stable. Left chest wall port is again seen. Lungs are well aerated bilaterally. No focal infiltrate or sizable effusion is seen. No acute bony abnormality is noted. IMPRESSION: Stable cardiomegaly.  No focal infiltrate is seen. Electronically Signed   By: MInez CatalinaM.D.   On: 06/17/2021 20:27   DG Abd Portable 1V  Result Date: 05/20/2021 CLINICAL DATA:  Abdominal pain EXAM: PORTABLE ABDOMEN - 1 VIEW COMPARISON:  CT 02/20/2021 FINDINGS: Large colonic stool burden. Scattered air-filled loops of bowel noted throughout the abdomen and pelvis but without high-grade obstructive pattern. Few stable coarse calcifications noted over the hepatic parenchyma. No new suspicious abdominal calcifications are evident. No acute or worrisome osseous abnormality. Telemetry leads overlie the lower chest and abdomen. IMPRESSION: Large colonic stool burden.  No  high-grade obstruction. Stable calcification projecting over the liver. Electronically Signed   By: PLovena LeM.D.   On: 05/20/2021 19:55   UK[place]Abdomen Limited RUQ (LIVER/GB)  Result Date: 06/17/2021 CLINICAL DATA:  Right upper quadrant pain EXAM: ULTRASOUND ABDOMEN LIMITED RIGHT UPPER QUADRANT COMPARISON:  CT 06/16/2021 FINDINGS: Gallbladder: No shadowing stones. Increased gallbladder wall thickness up to 6 mm. Negative sonographic Murphy. Common bile duct: Diameter: 3.1 mm Liver: Echogenicity within normal limits. Shadowing calcification within the right hepatic lobe. Portal vein is patent on color Doppler imaging with normal direction of blood flow towards the liver. Other: None. IMPRESSION: 1. Gallbladder wall thickening without shadowing stone or sonographic Murphy. Findings are nonspecific. If acute gallbladder disease remains a concern, further evaluation with hepatobiliary nuclear medicine imaging could be obtained Electronically Signed   By: KDonavan FoilM.D.   On: 06/17/2021 20:09    ASSESSMENT AND PLAN: Gastric cancer CT abdomen/pelvis 11/01/2020-masslike area measuring 6.3 x 5.6 x 5.6 cm in the antral prepyloric region of the stomach Upper endoscopy 11/05/2020-patchy white plaques in the distal esophagus; 3 nonbleeding cratered gastric ulcers with pigmented material in the gastric antrum largest 10 mm; segmental moderate inflammation characterized by congestion, erosions and erythema in the gastric antrum.  Biopsy of stomach ulcer-intramucosal adenocarcinoma arising in a background of intestinal metaplasia, HER-2 negative PET 11/2020-focal area of hypermetabolic activity at the gastric antrum, averages into the liver and small liver lesion adjacent to stomach not excluded, small chest lymph nodes with mild increase in metabolic activity-nonspecific, symmetric parotid and extraocular muscle activity-likely physiologic Cycle 1 FOLFOX 12/12/2020 Cycle 2 FOLFOX 12/26/2020 Cycle 3  FOLFOX  01/08/2021 Cycle 4 FOLFOX 01/22/2021 Cycle 5 FOLFOX 02/05/2021-Udenyca Cycle 6 FOLFOX 02/19/2021-Udenyca CTs 02/20/2021-previously noted mass of the posterior gastric antrum no longer seen.  No lymphadenopathy or metastatic disease in the abdomen or pelvis. CHF, LVEF 20-25% 10/04/2020 CAD status post prior LAD stent COPD Chronic kidney disease Cocaine usage Port-A-Cath placement 12/06/2020, Interventional Radiology Mild neutropenia secondary to chemotherapy-Udenyca added with cycle 5 FOLFOX Hospital admission 05/13/2021- acute exacerbation of CHF/end-stage heart failure Hospital admission 06/17/2021-abdominal pain  Tyler Wong has been admitted secondary to abdominal pain.  Initially, there was concern for acute cholecystitis noted on CT scan but HIDA scan does not indicate acute cholecystitis.  His abdomen is more distended but he continues to have abdominal pain.  He does have ascites and symptoms could certainly be due to gastric cancer recurrence.  Recommend ultrasound-guided paracentesis and sent for cytology.  We will request GI consult for consideration of upper endoscopy.  Recommendations: 1.  Ultrasound-guided paracentesis and send fluid for cytology. 2.  GI consult for consideration of upper endoscopy. 3.  We will continue to hold capecitabine pending further work-up.    LOS: 2 days   Mikey Bussing, DNP, AGPCNP-BC, AOCNP 06/19/21 Tyler Wong was interviewed and examined.  He is admitted with abdominal pain.  A CT revealed new small volume ascites and stranding in the upper abdomen.  I am concerned his symptoms are related to tumor progression in the stomach, and possibly carcinomatosis.  I was present for greater than 50% of today's visit.  I performed medical decision making.

## 2021-06-19 NOTE — Progress Notes (Signed)
Mobility Specialist: Progress Note   06/19/21 1411  Mobility  Activity Ambulated in hall  Level of Assistance Independent  Assistive Device None  Distance Ambulated (ft) 320 ft  Mobility Ambulated independently in hallway  Mobility Response Tolerated well  Mobility performed by Mobility specialist  $Mobility charge 1 Mobility   Post-Mobility: 59 HR, 100% SpO2  Pt c/o abdominal tightness/pain he rated 4/10 during ambulation as well as feeling SOB which he attributed to wearing a mask. Pt had no c/o dizziness or feeling light headed. Pt is sitting EOB with call bell in reach.   [facility] Rees Matura Mobility Specialist Mobility Specialist Phone: [phone]

## 2021-06-19 NOTE — Progress Notes (Signed)
Request to IR for diagnostic and therapeutic paracentesis due to CT abd/pelvis 06/17/21 noting trace ascites.  Limited abdominal US of all 4 quadrants shows no peritoneal fluid. No procedure performed, patient returned to floor in stable condition. Images available for review under imaging section of Epic.  Please call IR with questions or concerns.  Candiss Norse, PA-C

## 2021-06-19 NOTE — Progress Notes (Signed)
PROGRESS NOTE    Tyler Wong  ATF:[id number] DOB: [birth year] DOA: 06/17/2021 PCP: [name], NP   Brief Narrative:  HPI On 06/17/2021 by 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 emergency department at the direction of his oncologist due to concerns for cholecystitis.  Patient explains that for the past several weeks he has been experiencing increasing abdominal pain.  Patient describes his abdominal pain is epigastric in location, radiating around the abdomen, severe in intensity, sharp in quality, worse with ingestion of foods and associated with decreased appetite and generalized weakness.  Patient complains of nausea but denies vomiting.  Patient denies fevers.  Patient denies dysuria or diarrhea.  Patient denies recent travel sick contacts or contacts with confirmed COVID-19 infection.   Patient was seen in oncology clinic on 6/20 in preparation for upcoming initiation of radiation therapy on 6/27 during this evaluation patient's symptoms prompted Dr.Sherrill to obtain CT imaging of the abdomen which revealed possible developing acute cholecystitis and therefore the patient was instructed on 6/21 to go to the emergency department for evaluation.     Upon evaluation in the emergency department patient was found to exhibit multiple SIRS criteria including tachycardia and tachypnea in setting of lactic acidosis of 2.6.  ER provider ordered 2 L of lactated Ringer solution bolus for the patient.  Patient was administered intravenous fentanyl for pain.  ER provider discussed case with Dr. Barry Dienes with general surgery who recommended obtaining a HIDA scan, admission to  medicine and formal consultation of surgery if the HIDA scan is abnormal.  The hospitalist group was then called to assess the patient for admission to the hospital.  Interim history Patient presented with abdominal pain found to have acute acalculous cholecystitis.  HIDA scan showed no evidence of acute cholecystitis.  LFTs, alk phos and bilirubin trending upward.  Will discuss with gastroenterology. Assessment & Plan   Severe sepsis secondary to ? acute acalculous cholecystitis with elevated liver enzymes -Present on admission, patient was noted to have tachycardia, tachypnea, with lactic acid of 5.7 -Patient presenting with worsening abdominal pain -CT abdomen pelvis findings concerning for acute  acalculous cholecystitis -Lipase within normal limits -UA unremarkable -HIDA scan showed patent cystic duct and common bile duct.  No evidence of acute cholecystitis. -Gastroenterology consulted and appreciated given increase in LFTs, bilirubin and alk phos. Discussed with Dr. Cristina Gong, will order MRCP  Acute kidney injury on chronic kidney disease, stage IIIb -Baseline creatinine approximate 1.8-2.1 -On admission, creatinine 2.92- peaked today to 3.42 -Initially IV fluids were held given the patient has a history of CHF -Will start gentle IV hydration 75 cc/h -Will order renal US -Continue monitor BMP  Lactic acidosis -Suspect secondary to acalculus cholecystitis -Lactic acid 5.7, down to 2.6 today -Continue broad-spectrum antibiotics -Will continue IV fluids and monitor  Coronary artery disease -Patient currently chest pain-free -Continue aspirin, statin  Chronic combined systolic and diastolic heart failure -Appears to be euvolemic and compensated at this time -Mild LE edema bilaterally on examination -No shortness of breath -Echocardiogram 10/04/2020 shows an EF of 27-06%, grade 2 diastolic dysfunction -Monitor intake and output, daily weights  Hyperkalemia -Suspect secondary  to renal function -No EKG changes -Potassium up to 6.1 -Lokelma ordered, continue to monitor  BMP  Cocaine abuse -Patient counseled on cessation  COPD -No evidence of exacerbation at this time -Continue bronchodilator therapy as needed  Esophageal candidiasis -Patient with noted thrush on exam -Also complaining of odynophagia -Continue fluconazole for 14 days total -If symptoms do not improve, will consider GI consultation  Hepatitis B/C -Patient had hepatitis panel done in April 2021 and positive Hep B Core Ab in April 2018 -Hepatitis C antibody was reactive, with a quantitative HCV of 28, quantitative log of 1.301 -Will discuss with infectious disease -will likely need outpatient follow up  DVT Prophylaxis  lovenox  Code Status: Full  Family Communication: Sister at bedside  Disposition Plan:  Status is: Inpatient  Remains inpatient appropriate because:Ongoing diagnostic testing needed not appropriate for outpatient work up and IV treatments appropriate due to intensity of illness or inability to take PO  Dispo: The patient is from: Home              Anticipated d/c is to: Home              Patient currently is not medically stable to d/c.   Difficult to place patient No   Consultants General surgery by EDP  Procedures  None  Antibiotics   Anti-infectives (From admission, onward)    Start     Dose/Rate Route Frequency Ordered Stop   06/18/21 2200  fluconazole (DIFLUCAN) tablet 200 mg       See Hyperspace for full Linked Orders Report.   200 mg Oral Daily at bedtime 06/17/21 2348 07/01/21 2159   06/18/21 0600  piperacillin-tazobactam (ZOSYN) IVPB 3.375 g        3.375 g 12.5 mL/hr over 240 Minutes Intravenous Every 8 hours 06/18/21 0019     06/18/21 0030  piperacillin-tazobactam (ZOSYN) IVPB 3.375 g        3.375 g 100 mL/hr over 30 Minutes Intravenous  Once 06/18/21 0019 06/18/21 0309   06/18/21 0000  fluconazole (DIFLUCAN) tablet 400 mg       See Hyperspace  for full Linked Orders Report.   400 mg Oral  Once 06/17/21 2348 06/18/21 0057       Subjective:   Tyler Wong seen and examined today.  States he felt bad this morning is having some abdominal pain.  Was able to eat yesterday without pain.  Denies current nausea or vomiting.  Would like to continue to try to eat today.  Denies current chest pain or shortness of breath, dizziness or headache.   Objective:   Vitals:   06/18/21 1532 06/18/21 1958 06/19/21 0528 06/19/21 0849  BP:  112/72 99/81 95/76   Pulse: 94  100 100  Resp: 18 18 17 18   Temp: 97.8 F (36.6 C) 98.1 F (36.7 C) 98.1 F (36.7 C) 97.6 F (36.4 C)  TempSrc: Oral Oral Oral Oral  SpO2: 93% 97% 98% 96%  Weight:      Height:        Intake/Output Summary (Last 24 hours) at 06/19/2021 1047 Last data filed at 06/19/2021 0935 Gross per 24 hour  Intake 360 ml  Output 600 ml  Net -240 ml    Filed Weights   06/17/21 1939  Weight: 75 kg   Exam General: Well developed, chronically ill-appearing, NAD HEENT: NCAT, mucous membranes moist.  Cardiovascular: S1 S2 auscultated, RRR Respiratory: Clear to auscultation bilaterally  Abdomen: Soft, nontender today, nondistended, + bowel sounds Extremities: warm dry without cyanosis clubbing. Mild LE edema Neuro: AAOx3, nonfocal Psych: normal affect and demeanor  Data Reviewed: I have personally reviewed following labs and imaging studies  CBC: Recent Labs  Lab 06/16/21 1122 06/17/21 1836 06/18/21 0543 06/19/21 0140  WBC 9.3 8.4 11.5* 12.4*  NEUTROABS 6.5 6.0 8.1*  --   HGB 13.2 12.7* 13.1 11.1*  HCT 40.5 38.7* 40.6 32.9*  MCV 81.2 79.8* 82.0 78.1*  PLT 323 336 339 355    Basic Metabolic Panel: Recent Labs  Lab 06/16/21 1122 06/17/21 1836 06/18/21 0100 06/19/21 0140  NA 132* 129* 131* 128*  K 5.0 5.6* 5.8* 6.1*  CL 92* 94* 97* 92*  CO2 27 23 21* 23  GLUCOSE 71 135* 157* 146*  BUN 59* 69* 66* 83*  CREATININE 2.87* 2.92* 2.70* 3.42*  CALCIUM 9.6 9.2 8.5*  8.8*  MG  --   --  2.3  --     GFR: Estimated Creatinine Clearance: 25.2 mL/min (A) (by C-G formula based on SCr of 3.42 mg/dL (H)). Liver Function Tests: Recent Labs  Lab 06/16/21 1122 06/17/21 1836 06/18/21 0100 06/19/21 0140  AST 36 47* 46* 155*  ALT 31 40 38 122*  ALKPHOS 145* 168* 139* 142*  BILITOT 1.5* 2.0* 2.4* 3.4*  PROT 7.3 7.1 6.4* 6.2*  ALBUMIN 3.8 3.3* 3.0* 2.7*    Recent Labs  Lab 06/17/21 1836  LIPASE 52*    No results for input(s): AMMONIA in the last 168 hours. Coagulation Profile: Recent Labs  Lab 06/17/21 1836 06/18/21 0543  INR 1.5* 1.7*    Cardiac Enzymes: No results for input(s): CKTOTAL, CKMB, CKMBINDEX, TROPONINI in the last 168 hours. BNP (last 3 results) No results for input(s): PROBNP in the last 8760 hours. HbA1C: No results for input(s): HGBA1C in the last 72 hours. CBG: No results for input(s): GLUCAP in the last 168 hours. Lipid Profile: No results for input(s): CHOL, HDL, LDLCALC, TRIG, CHOLHDL, LDLDIRECT in the last 72 hours. Thyroid Function Tests: No results for input(s): TSH, T4TOTAL, FREET4, T3FREE, THYROIDAB in the last 72 hours. Anemia Panel: No results for input(s): VITAMINB12, FOLATE, FERRITIN, TIBC, IRON, RETICCTPCT in the last 72 hours. Urine analysis:    Component Value Date/Time   COLORURINE YELLOW 06/17/2021 2225   APPEARANCEUR CLEAR 06/17/2021 2225   LABSPEC 1.011 06/17/2021 2225   PHURINE 5.0 06/17/2021 2225   GLUCOSEU NEGATIVE 06/17/2021 2225   HGBUR NEGATIVE 06/17/2021 2225   BILIRUBINUR NEGATIVE 06/17/2021 [place] 06/17/2021 2225   PROTEINUR 100 (A) 06/17/2021 2225   NITRITE NEGATIVE 06/17/2021 2225   LEUKOCYTESUR NEGATIVE 06/17/2021 2225   Sepsis Labs: @LABRCNTIP (procalcitonin:4,lacticidven:4)  ) Recent Results (from the past 240 hour(s))  Blood Culture (routine x 2)     Status: None (Preliminary result)   Collection Time: 06/17/21  6:36 PM   Specimen: BLOOD  Result Value Ref  Range Status   Specimen Description BLOOD RIGHT ANTECUBITAL  Final   Special Requests   Final    BOTTLES DRAWN AEROBIC ONLY Blood Culture results may not be optimal due to an inadequate volume of blood received in culture bottles   Culture   Final    NO GROWTH 2 DAYS Performed at [facility] [address] [place], [place] [address]    Report Status PENDING  Incomplete  Blood Culture (routine x 2)     Status: None (Preliminary result)   Collection Time: 06/17/21  7:30 PM   Specimen: BLOOD  Result Value Ref Range Status   Specimen Description BLOOD BLOOD RIGHT FOREARM  Final   Special Requests  Final    BOTTLES DRAWN AEROBIC AND ANAEROBIC Blood Culture results may not be optimal due to an inadequate volume of blood received in culture bottles   Culture   Final    NO GROWTH 2 DAYS Performed at [facility], [place] [address], [place], [place] [address]    Report Status PENDING  Incomplete  Resp Panel by RT-PCR (Flu A&B, Covid) Nasopharyngeal Swab     Status: None   Collection Time: 06/17/21  7:30 PM   Specimen: Nasopharyngeal Swab; Nasopharyngeal(NP) swabs in vial transport medium  Result Value Ref Range Status   SARS Coronavirus 2 by RT PCR NEGATIVE NEGATIVE Final    Comment: (NOTE) SARS-CoV-2 target nucleic acids are NOT DETECTED.  The SARS-CoV-2 RNA is generally detectable in upper respiratory specimens during the acute phase of infection. The lowest concentration of SARS-CoV-2 viral copies this assay can detect is 138 copies/mL. A negative result does not preclude SARS-Cov-2 infection and should not be used as the sole basis for treatment or other patient management decisions. A negative result may occur with  improper specimen collection/handling, submission of specimen other than nasopharyngeal swab, presence of viral mutation(s) within the areas targeted by this assay, and inadequate number of viral copies(<138 copies/mL). A negative result must be combined  with clinical observations, patient history, and epidemiological information. The expected result is Negative.  Fact Sheet for Patients:  EntrepreneurPulse.com.au  Fact Sheet for Healthcare Providers:  IncredibleEmployment.be  This test is no t yet approved or cleared by the [place] FDA and  has been authorized for detection and/or diagnosis of SARS-CoV-2 by FDA under an Emergency Use Authorization (EUA). This EUA will remain  in effect (meaning this test can be used) for the duration of the COVID-19 declaration under Section 564(b)(1) of the Act, 21 U.S.C.section 360bbb-3(b)(1), unless the authorization is terminated  or revoked sooner.       Influenza A by PCR NEGATIVE NEGATIVE Final   Influenza B by PCR NEGATIVE NEGATIVE Final    Comment: (NOTE) The Xpert Xpress SARS-CoV-2/FLU/RSV plus assay is intended as an aid in the diagnosis of influenza from Nasopharyngeal swab specimens and should not be used as a sole basis for treatment. Nasal washings and aspirates are unacceptable for Xpert Xpress SARS-CoV-2/FLU/RSV testing.  Fact Sheet for Patients: EntrepreneurPulse.com.au  Fact Sheet for Healthcare Providers: IncredibleEmployment.be  This test is not yet approved or cleared by the [place] FDA and has been authorized for detection and/or diagnosis of SARS-CoV-2 by FDA under an Emergency Use Authorization (EUA). This EUA will remain in effect (meaning this test can be used) for the duration of the COVID-19 declaration under Section 564(b)(1) of the Act, 21 U.S.C. section 360bbb-3(b)(1), unless the authorization is terminated or revoked.  Performed at [facility], [place] [address], [place], [place] [address]       Radiology Studies: CT ABDOMEN PELVIS WO CONTRAST  Result Date: 06/17/2021 CLINICAL DATA:  Sepsis Abdominal pain, acute, nonlocalized. Dx with "abdominal" cancer and had  PET scan yesterday and oncologist called him today and said he has an inflamed gal bladder and that he should go to the ED EXAM: CT ABDOMEN AND PELVIS WITHOUT CONTRAST TECHNIQUE: Multidetector CT imaging of the abdomen and pelvis was performed following the standard protocol without IV contrast. COMPARISON:  Ultrasound abdomen 06/17/2021, CT abdomen pelvis 06/16/2021, CT abdomen pelvis 02/20/2021, PET CT 11/28/2020 FINDINGS: Lower chest: Cardiomegaly.  Persistent trace pericardial effusion. Hepatobiliary: No focal liver abnormality. Gallbladder wall thickening  and haziness. No CT evidence of a calcified stone. No biliary dilatation. Pancreas: No focal lesion. Normal pancreatic contour. No surrounding inflammatory changes. No main pancreatic ductal dilatation. Spleen: Normal in size without focal abnormality. Adrenals/Urinary Tract: No adrenal nodule bilaterally. No nephrolithiasis, no hydronephrosis, and no contour-deforming renal mass. No ureterolithiasis or hydroureter. The urinary bladder is unremarkable. Stomach/Bowel: PO contrast reaches the rectum. Haziness as well as question of wall thickening of the distal duodenum with associated fat stranding and free fluid within the upper abdomen. Stomach is within normal limits. No evidence of large bowel wall thickening or dilatation. The appendix not definitely identified. Vascular/Lymphatic: No abdominal aorta or iliac aneurysm. Mild atherosclerotic plaque of the aorta and its branches. No abdominal, pelvic, or inguinal lymphadenopathy. Reproductive: Prostate is unremarkable. Other: No intraperitoneal free fluid. No intraperitoneal free gas. No organized fluid collection. Musculoskeletal: No abdominal wall hernia or abnormality. No suspicious lytic or blastic osseous lesions. No acute displaced fracture. IMPRESSION: 1. Findings concerning for acalculous acute cholecystitis. Differential diagnosis of nonspecific stand alone gallbladder wall thickening includes  chronic liver disease versus malignancy. A HIDA scan could be obtained for further evaluation of likely acalculous acute cholecystitis. 2. Haziness as well as question of wall thickening of the distal duodenum with associated fat stranding and free fluid within the upper abdomen. Finding may represent reactive changes versus duodenitis. 3. Given upper abdominal fat stranding in the vicinity of the pancreas, consider evaluation with lipase levels. 4. Trace simple free fluid ascites. 5. Markedly limited evaluation due to noncontrast study in a patient with known malignancy. Electronically Signed   By: Iven Finn M.D.   On: 06/17/2021 22:23   NM Hepatobiliary Liver Func  Result Date: 06/18/2021 CLINICAL DATA:  Elevated bilirubin: 2.4 (06/18/21) HPI: 55 year old male with past medical history of gastric cancer (Dx 10/2010), chronic kidney disease stage IIIb, EXAM: NUCLEAR MEDICINE HEPATOBILIARY IMAGING TECHNIQUE: Sequential images of the abdomen were obtained out to 60 minutes following intravenous administration of radiopharmaceutical. RADIOPHARMACEUTICALS:  7.6 mCi Tc-92m Choletec IV COMPARISON:  CT 621 22 FINDINGS: Prompt uptake and biliary excretion of activity by the liver is seen. Gallbladder activity is visualized, consistent with patency of cystic duct. Biliary activity passes into small bowel, consistent with patent common bile duct. IMPRESSION: 1. Patent cystic duct and common bile duct. 2. No evidence acute cholecystitis. Electronically Signed   By: SSuzy BouchardM.D.   On: 06/18/2021 14:42   DG Chest Port 1 View  Result Date: 06/17/2021 CLINICAL DATA:  Nausea EXAM: PORTABLE CHEST 1 VIEW COMPARISON:  05/14/2021 FINDINGS: Cardiac shadow is enlarged but stable. Left chest wall port is again seen. Lungs are well aerated bilaterally. No focal infiltrate or sizable effusion is seen. No acute bony abnormality is noted. IMPRESSION: Stable cardiomegaly.  No focal infiltrate is seen. Electronically  Signed   By: MInez CatalinaM.D.   On: 06/17/2021 20:27   [place]Abdomen Limited RUQ (LIVER/GB)  Result Date: 06/17/2021 CLINICAL DATA:  Right upper quadrant pain EXAM: ULTRASOUND ABDOMEN LIMITED RIGHT UPPER QUADRANT COMPARISON:  CT 06/16/2021 FINDINGS: Gallbladder: No shadowing stones. Increased gallbladder wall thickness up to 6 mm. Negative sonographic Murphy. Common bile duct: Diameter: 3.1 mm Liver: Echogenicity within normal limits. Shadowing calcification within the right hepatic lobe. Portal vein is patent on color Doppler imaging with normal direction of blood flow towards the liver. Other: None. IMPRESSION: 1. Gallbladder wall thickening without shadowing stone or sonographic Murphy. Findings are nonspecific. If acute gallbladder disease remains a concern, further  evaluation with hepatobiliary nuclear medicine imaging could be obtained Electronically Signed   By: Donavan Foil M.D.   On: 06/17/2021 20:09     Scheduled Meds:  aspirin EC  81 mg Oral Daily   atorvastatin  80 mg Oral Daily   enoxaparin (LOVENOX) injection  40 mg Subcutaneous Q24H   fluconazole  200 mg Oral QHS   gabapentin  100 mg Oral BID   pantoprazole  40 mg Oral BID AC   sodium zirconium cyclosilicate  10 g Oral BID   [START ON 06/20/2021] sodium zirconium cyclosilicate  10 g Oral BID   Continuous Infusions:  sodium chloride 50 mL/hr at 06/19/21 0935   piperacillin-tazobactam (ZOSYN)  IV 3.375 g (06/19/21 0522)     LOS: 2 days   Time Spent in minutes   45 minutes  Hedaya Latendresse D.O. on 06/19/2021 at 10:47 AM  Between 7am to 7pm - Please see pager noted on [email or web address]  After 7pm go to [email or web address]  And look for the night coverage person covering for me after hours  [facility] Office  [phone]

## 2021-06-20 ENCOUNTER — Inpatient Hospital Stay (HOSPITAL_COMMUNITY): Payer: Medicaid Other

## 2021-06-20 ENCOUNTER — Encounter (HOSPITAL_COMMUNITY): Payer: Self-pay | Admitting: Internal Medicine

## 2021-06-20 ENCOUNTER — Inpatient Hospital Stay (HOSPITAL_COMMUNITY): Payer: Medicaid Other | Admitting: Anesthesiology

## 2021-06-20 ENCOUNTER — Encounter (HOSPITAL_COMMUNITY)
Admission: EM | Disposition: A | Discharge status: Home / Self Care | Payer: Self-pay | Source: Home / Self Care | Attending: Internal Medicine

## 2021-06-20 ENCOUNTER — Inpatient Hospital Stay: Payer: Medicaid Other | Admitting: Nurse Practitioner

## 2021-06-20 HISTORY — PX: ESOPHAGOGASTRODUODENOSCOPY: SHX5428

## 2021-06-20 HISTORY — PX: BIOPSY: SHX5522

## 2021-06-20 LAB — COMPREHENSIVE METABOLIC PANEL
ALT: 122 U/L — ABNORMAL HIGH (ref 0–44)
AST: 118 U/L — ABNORMAL HIGH (ref 15–41)
Albumin: 2.9 g/dL — ABNORMAL LOW (ref 3.5–5.0)
Alkaline Phosphatase: 145 U/L — ABNORMAL HIGH (ref 38–126)
Anion gap: 10 (ref 5–15)
BUN: 75 mg/dL — ABNORMAL HIGH (ref 6–20)
CO2: 22 mmol/L (ref 22–32)
Calcium: 8 mg/dL — ABNORMAL LOW (ref 8.9–10.3)
Chloride: 95 mmol/L — ABNORMAL LOW (ref 98–111)
Creatinine, Ser: 2.82 mg/dL — ABNORMAL HIGH (ref 0.61–1.24)
GFR, Estimated: 26 mL/min — ABNORMAL LOW (ref >60)
Glucose, Bld: 103 mg/dL — ABNORMAL HIGH (ref 70–99)
Potassium: 5 mmol/L (ref 3.5–5.1)
Sodium: 127 mmol/L — ABNORMAL LOW (ref 135–145)
Total Bilirubin: 2.1 mg/dL — ABNORMAL HIGH (ref 0.3–1.2)
Total Protein: 6.5 g/dL (ref 6.5–8.1)

## 2021-06-20 LAB — HEPATITIS B DNA, ULTRAQUANTITATIVE, PCR
HBV DNA SERPL PCR-ACNC: NOT DETECTED IU/mL
HBV DNA SERPL PCR-LOG IU: UNDETERMINED log10 IU/mL

## 2021-06-20 LAB — CBC
HCT: 36.1 % — ABNORMAL LOW (ref 39.0–52.0)
Hemoglobin: 12 g/dL — ABNORMAL LOW (ref 13.0–17.0)
MCH: 26.1 pg (ref 26.0–34.0)
MCHC: 33.2 g/dL (ref 30.0–36.0)
MCV: 78.6 fL — ABNORMAL LOW (ref 80.0–100.0)
Platelets: 314 10*3/uL (ref 150–400)
RBC: 4.59 MIL/uL (ref 4.22–5.81)
RDW: 17.9 % — ABNORMAL HIGH (ref 11.5–15.5)
WBC: 10.1 10*3/uL (ref 4.0–10.5)
nRBC: 0.9 % — ABNORMAL HIGH (ref 0.0–0.2)

## 2021-06-20 LAB — HEPATITIS B SURFACE ANTIBODY, QUANTITATIVE: Hep B S AB Quant (Post): 519.3 m[IU]/mL (ref >9.9)

## 2021-06-20 SURGERY — EGD (ESOPHAGOGASTRODUODENOSCOPY)
Anesthesia: General

## 2021-06-20 MED ORDER — PHENYLEPHRINE 40 MCG/ML (10ML) SYRINGE FOR IV PUSH (FOR BLOOD PRESSURE SUPPORT)
PREFILLED_SYRINGE | INTRAVENOUS | Status: DC | PRN
  Administered 2021-06-20: 80 ug via INTRAVENOUS

## 2021-06-20 MED ORDER — METOCLOPRAMIDE HCL 5 MG/ML IJ SOLN
5.0000 mg | Freq: Four times a day (QID) | INTRAMUSCULAR | Status: AC
  Administered 2021-06-20 – 2021-06-21 (×4): 5 mg via INTRAVENOUS
  Filled 2021-06-20 (×4): qty 2

## 2021-06-20 MED ORDER — MIDAZOLAM HCL (PF) 5 MG/ML IJ SOLN
INTRAMUSCULAR | Status: AC
  Filled 2021-06-20: qty 1

## 2021-06-20 MED ORDER — SODIUM CHLORIDE 0.9 % IV SOLN: INTRAVENOUS | Status: DC | PRN

## 2021-06-20 MED ORDER — MIDAZOLAM HCL (PF) 5 MG/ML IJ SOLN
INTRAMUSCULAR | Status: DC | PRN
  Administered 2021-06-20: 5 mg via INTRAVENOUS

## 2021-06-20 MED ORDER — PHENYLEPHRINE HCL-NACL 10-0.9 MG/250ML-% IV SOLN
INTRAVENOUS | Status: DC | PRN
  Administered 2021-06-20: 25 ug/min via INTRAVENOUS

## 2021-06-20 MED ORDER — VASOPRESSIN 20 UNIT/ML IV SOLN
INTRAVENOUS | Status: DC | PRN
  Administered 2021-06-20 (×2): 2 [IU] via INTRAVENOUS

## 2021-06-20 MED ORDER — OXYCODONE HCL 5 MG PO TABS: 5.0000 mg | ORAL_TABLET | ORAL | Status: DC | PRN

## 2021-06-20 MED ORDER — FLUMAZENIL 0.5 MG/5ML IV SOLN
INTRAVENOUS | Status: AC
  Filled 2021-06-20: qty 5

## 2021-06-20 MED ORDER — PHENOL 1.4 % MT LIQD: 1.0000 | OROMUCOSAL | Status: DC | PRN

## 2021-06-20 MED ORDER — FLUMAZENIL 0.5 MG/5ML IV SOLN
INTRAVENOUS | Status: DC | PRN
  Administered 2021-06-20: 5 mg via INTRAVENOUS

## 2021-06-20 MED ORDER — ALBUMIN HUMAN 5 % IV SOLN: INTRAVENOUS | Status: DC | PRN

## 2021-06-20 MED ORDER — PROPOFOL 10 MG/ML IV BOLUS
INTRAVENOUS | Status: DC | PRN
  Administered 2021-06-20 (×2): 50 mg via INTRAVENOUS

## 2021-06-20 NOTE — Anesthesia Preprocedure Evaluation (Signed)
Anesthesia Evaluation  Patient identified by MRN, date of birth, ID band Patient awake    Reviewed: Allergy & Precautions, NPO status , Patient's Chart, lab work & pertinent test results  Airway Mallampati: II  TM Distance: >3 FB Neck ROM: Full    Dental  (+) Poor Dentition,    Pulmonary Current Smoker and Patient abstained from smoking.,    breath sounds clear to auscultation       Cardiovascular hypertension,  Rhythm:Regular Rate:Normal     Neuro/Psych    GI/Hepatic   Endo/Other  diabetes  Renal/GU      Musculoskeletal   Abdominal   Peds  Hematology   Anesthesia Other Findings   Reproductive/Obstetrics                             Anesthesia Physical Anesthesia Plan  ASA: 3  Anesthesia Plan: General   Post-op Pain Management:    Induction: Intravenous  PONV Risk Score and Plan: Ondansetron and Propofol infusion  Airway Management Planned: Natural Airway and Nasal Cannula  Additional Equipment:   Intra-op Plan:   Post-operative Plan:   Informed Consent: I have reviewed the patients History and Physical, chart, labs and discussed the procedure including the risks, benefits and alternatives for the proposed anesthesia with the patient or authorized representative who has indicated his/her understanding and acceptance.     Dental advisory given  Plan Discussed with: CRNA and Anesthesiologist  Anesthesia Plan Comments:         Anesthesia Quick Evaluation

## 2021-06-20 NOTE — Progress Notes (Signed)
PROGRESS NOTE    Tyler Wong  EXB:[id number] DOB: [birth year] DOA: 06/17/2021 PCP: [name], NP   Brief Narrative:  HPI On 06/17/2021 by 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  Patient explains that for the past several weeks he has been experiencing increasing abdominal pain.  Patient describes his abdominal pain is epigastric in location, radiating around the abdomen, severe in intensity, sharp in quality, worse with ingestion of foods and associated with decreased appetite and generalized weakness.  Patient complains of nausea but denies vomiting.  Patient denies fevers.  Patient denies dysuria or diarrhea.  Patient denies recent travel sick contacts or contacts with confirmed COVID-19 infection.   Patient was seen in oncology clinic on 6/20 in preparation for upcoming initiation of radiation therapy on 6/27 during this evaluation patient's symptoms prompted Dr.Sherrill to obtain CT imaging of the abdomen which revealed possible developing acute cholecystitis and therefore the patient was instructed on 6/21 to go to the emergency department for evaluation.     Upon evaluation in the emergency department patient was found to exhibit multiple SIRS criteria including tachycardia and tachypnea in setting of lactic acidosis of 2.6.  ER provider ordered 2 L of lactated Ringer solution bolus for the patient.  Patient was administered intravenous fentanyl for pain.  ER provider discussed case with Dr. Barry Dienes with general surgery who recommended obtaining a HIDA scan, admission to  medicine and formal consultation of surgery if the HIDA scan is abnormal.  The hospitalist group was then called to assess the patient for admission to the hospital.  Interim history Patient presented with abdominal pain found to have acute acalculous cholecystitis.  HIDA scan showed no evidence of acute cholecystitis.  LFTs, alk phos and bilirubin trending upward.  Will discuss with gastroenterology. Assessment & Plan   Severe sepsis secondary to ? acute acalculous cholecystitis with elevated liver enzymes -Present on admission, patient was noted to have tachycardia, tachypnea, with lactic acid of 5.7 -Patient presenting with worsening abdominal pain -CT abdomen pelvis findings concerning for acute  acalculous cholecystitis -Lipase within normal limits -UA unremarkable -HIDA scan showed patent cystic duct and common bile duct.  No evidence of acute cholecystitis. -Gastroenterology consulted and appreciated given increase in LFTs, bilirubin and alk phos. Discussed with Dr. Cristina Gong, will order MRCP -MRCP showed stable diffuse gallbladder wall thickening, normal caliber and course of common bile duct.  No obvious mesenteric or retroperitoneal adenopathy. -EGD showed large amount of retained food.  Recommendations were to switch to liquid diet, initiate metoclopramide 5 mg IV every 6 hours, if tolerated increase to 10 mg IV every 6 hours.  Await results of duodenal and gastric biopsies.  May need repeat endoscopy in several days.  Acute kidney injury on chronic kidney disease, stage IIIb -Baseline creatinine approximate 1.8-2.1 -On admission, creatinine 2.92- peaked to 3.42 -Creatinine down to 2.82 today -Initially IV fluids were held given the patient has a history of CHF -Continue IV hydration 75 cc/h -Renal US was unremarkable -Continue monitor BMP  Lactic acidosis -Suspect secondary to acalculus cholecystitis -Lactic acid 5.7, down to 2.6 -we will continue to monitor -Continue  broad-spectrum antibiotics -Will continue IV  fluids and monitor  Coronary artery disease -Patient currently chest pain-free -Continue aspirin, statin  Chronic combined systolic and diastolic heart failure -Appears to be euvolemic and compensated at this time -Mild LE edema bilaterally on examination -No shortness of breath -Echocardiogram 10/04/2020 shows an EF of 62-56%, grade 2 diastolic dysfunction -Monitor intake and output, daily weights  Hyperkalemia -Suspect secondary to renal function -No EKG changes -Resolved with Lokelma -Continue to monitor BMP  Cocaine abuse -Patient counseled on cessation  COPD -No evidence of exacerbation at this time -Continue bronchodilator therapy as needed  Esophageal candidiasis -Patient with noted thrush on exam -Also complaining of odynophagia -Continue fluconazole for 14 days total -If symptoms do not improve, will consider GI consultation  Hepatitis B/C -Patient had hepatitis panel done in April 2021 and positive Hep B Core Ab in April 2018 -Hepatitis C antibody was reactive, with a quantitative HCV of 28, quantitative log of 1.301 -Will discuss with infectious disease -will likely need outpatient follow up  DVT Prophylaxis  lovenox  Code Status: Full  Family Communication: None at bedside  Disposition Plan:  Status is: Inpatient  Remains inpatient appropriate because:Ongoing diagnostic testing needed not appropriate for outpatient work up and IV treatments appropriate due to intensity of illness or inability to take PO  Dispo: The patient is from: Home              Anticipated d/c is to: Home              Patient currently is not medically stable to d/c.   Difficult to place patient No   Consultants General surgery by EDP  Procedures  None  Antibiotics   Anti-infectives (From admission, onward)    Start     Dose/Rate Route Frequency Ordered Stop   06/18/21 2200  [MAR Hold]  fluconazole (DIFLUCAN) tablet 200 mg         (MAR Hold since Fri 06/20/2021 at 1208.Hold Reason: Transfer to a Procedural area)  See Hyperspace for full Linked Orders Report.   200 mg Oral Daily at bedtime 06/17/21 2348 07/01/21 2159   06/18/21 0600  [MAR Hold]  piperacillin-tazobactam (ZOSYN) IVPB 3.375 g        (MAR Hold since Fri 06/20/2021 at 1208.Hold Reason: Transfer to a Procedural area)   3.375 g 12.5 mL/hr over 240 Minutes Intravenous Every 8 hours 06/18/21 0019     06/18/21 0030  piperacillin-tazobactam (ZOSYN) IVPB 3.375 g        3.375 g 100 mL/hr over 30 Minutes Intravenous  Once 06/18/21 0019 06/18/21 0309   06/18/21 0000  fluconazole (DIFLUCAN) tablet 400 mg       See Hyperspace for full Linked Orders Report.   400 mg Oral  Once 06/17/21 2348 06/18/21 0057       Subjective:   [name] seen and examined today.  Continues to have abdominal pain.  Upset that he is unable to eat or drink anything at this time.  Denies nausea or vomiting.  Denies chest pain or shortness of breath, dizziness or headache.  Was able to have a bowel movement yesterday.   Objective:   Vitals:   06/20/21 1417 06/20/21 1418 06/20/21 1423 06/20/21 1428  BP: 110/75 110/75 113/88 118/88  Pulse: 94     Resp: (!) $RemoveB'23 19 14 'mZrYQYdn$ (!) 22  Temp:      TempSrc:      SpO2: 95%     Weight:      Height:  Intake/Output Summary (Last 24 hours) at 06/20/2021 1429 Last data filed at 06/20/2021 0300 Gross per 24 hour  Intake 1878.31 ml  Output --  Net 1878.31 ml    Filed Weights   06/17/21 1939 06/20/21 0500  Weight: 75 kg 79.5 kg   Exam General: Well developed, chronically ill-appearing, NAD HEENT: NCAT, mucous membranes moist.  Cardiovascular: S1 S2 auscultated, RRR Respiratory: Clear to auscultation bilaterally  Abdomen: Soft, upper abdominal TTP, nondistended, + bowel sounds Extremities: warm dry without cyanosis clubbing. Mild LE edema Neuro: AAOx3, nonfocal Psych: normal affect and demeanor  Data Reviewed: I have personally  reviewed following labs and imaging studies  CBC: Recent Labs  Lab 06/16/21 1122 06/17/21 1836 06/18/21 0543 06/19/21 0140 06/20/21 0426  WBC 9.3 8.4 11.5* 12.4* 10.1  NEUTROABS 6.5 6.0 8.1*  --   --   HGB 13.2 12.7* 13.1 11.1* 12.0*  HCT 40.5 38.7* 40.6 32.9* 36.1*  MCV 81.2 79.8* 82.0 78.1* 78.6*  PLT 323 336 339 308 832    Basic Metabolic Panel: Recent Labs  Lab 06/16/21 1122 06/17/21 1836 06/18/21 0100 06/19/21 0140 06/20/21 0426  NA 132* 129* 131* 128* 127*  K 5.0 5.6* 5.8* 6.1* 5.0  CL 92* 94* 97* 92* 95*  CO2 27 23 21* 23 22  GLUCOSE 71 135* 157* 146* 103*  BUN 59* 69* 66* 83* 75*  CREATININE 2.87* 2.92* 2.70* 3.42* 2.82*  CALCIUM 9.6 9.2 8.5* 8.8* 8.0*  MG  --   --  2.3  --   --     GFR: Estimated Creatinine Clearance: 30.6 mL/min (A) (by C-G formula based on SCr of 2.82 mg/dL (H)). Liver Function Tests: Recent Labs  Lab 06/16/21 1122 06/17/21 1836 06/18/21 0100 06/19/21 0140 06/20/21 0426  AST 36 47* 46* 155* 118*  ALT 31 40 38 122* 122*  ALKPHOS 145* 168* 139* 142* 145*  BILITOT 1.5* 2.0* 2.4* 3.4* 2.1*  PROT 7.3 7.1 6.4* 6.2* 6.5  ALBUMIN 3.8 3.3* 3.0* 2.7* 2.9*    Recent Labs  Lab 06/17/21 1836 06/19/21 0140  LIPASE 52* 45    No results for input(s): AMMONIA in the last 168 hours. Coagulation Profile: Recent Labs  Lab 06/17/21 1836 06/18/21 0543  INR 1.5* 1.7*    Cardiac Enzymes: No results for input(s): CKTOTAL, CKMB, CKMBINDEX, TROPONINI in the last 168 hours. BNP (last 3 results) No results for input(s): PROBNP in the last 8760 hours. HbA1C: No results for input(s): HGBA1C in the last 72 hours. CBG: No results for input(s): GLUCAP in the last 168 hours. Lipid Profile: No results for input(s): CHOL, HDL, LDLCALC, TRIG, CHOLHDL, LDLDIRECT in the last 72 hours. Thyroid Function Tests: No results for input(s): TSH, T4TOTAL, FREET4, T3FREE, THYROIDAB in the last 72 hours. Anemia Panel: No results for input(s): VITAMINB12,  FOLATE, FERRITIN, TIBC, IRON, RETICCTPCT in the last 72 hours. Urine analysis:    Component Value Date/Time   COLORURINE YELLOW 06/17/2021 [place] 06/17/2021 2225   LABSPEC 1.011 06/17/2021 2225   PHURINE 5.0 06/17/2021 2225   GLUCOSEU NEGATIVE 06/17/2021 2225   HGBUR NEGATIVE 06/17/2021 2225   BILIRUBINUR NEGATIVE 06/17/2021 2225   KETONESUR NEGATIVE 06/17/2021 2225   PROTEINUR 100 (A) 06/17/2021 2225   NITRITE NEGATIVE 06/17/2021 2225   LEUKOCYTESUR NEGATIVE 06/17/2021 2225   Sepsis Labs: $RemoveBefo'@[email or web address]'EzlAmBfKRFv$ (procalcitonin:4,lacticidven:4)  ) Recent Results (from the past 240 hour(s))  Blood Culture (routine x 2)     Status: None (Preliminary result)   Collection Time: 06/17/21  6:36 PM   Specimen: BLOOD  Result Value Ref Range Status   Specimen Description BLOOD RIGHT ANTECUBITAL  Final   Special Requests   Final    BOTTLES DRAWN AEROBIC ONLY Blood Culture results may not be optimal due to an inadequate volume of blood received in culture bottles   Culture   Final    NO GROWTH 2 DAYS Performed at [facility] [address], [place], [place] [address]    Report Status PENDING  Incomplete  Blood Culture (routine x 2)     Status: None (Preliminary result)   Collection Time: 06/17/21  7:30 PM   Specimen: BLOOD  Result Value Ref Range Status   Specimen Description BLOOD BLOOD RIGHT FOREARM  Final   Special Requests   Final    BOTTLES DRAWN AEROBIC AND ANAEROBIC Blood Culture results may not be optimal due to an inadequate volume of blood received in culture bottles   Culture   Final    NO GROWTH 2 DAYS Performed at [facility], [place] [address], [place], [place] [address]    Report Status PENDING  Incomplete  Resp Panel by RT-PCR (Flu A&B, Covid) Nasopharyngeal Swab     Status: None   Collection Time: 06/17/21  7:30 PM   Specimen: Nasopharyngeal Swab; Nasopharyngeal(NP) swabs in vial transport medium  Result Value Ref Range Status   SARS Coronavirus  2 by RT PCR NEGATIVE NEGATIVE Final    Comment: (NOTE) SARS-CoV-2 target nucleic acids are NOT DETECTED.  The SARS-CoV-2 RNA is generally detectable in upper respiratory specimens during the acute phase of infection. The lowest concentration of SARS-CoV-2 viral copies this assay can detect is 138 copies/mL. A negative result does not preclude SARS-Cov-2 infection and should not be used as the sole basis for treatment or other patient management decisions. A negative result may occur with  improper specimen collection/handling, submission of specimen other than nasopharyngeal swab, presence of viral mutation(s) within the areas targeted by this assay, and inadequate number of viral copies(<138 copies/mL). A negative result must be combined with clinical observations, patient history, and epidemiological information. The expected result is Negative.  Fact Sheet for Patients:  EntrepreneurPulse.com.au  Fact Sheet for Healthcare Providers:  IncredibleEmployment.be  This test is no t yet approved or cleared by the [place] FDA and  has been authorized for detection and/or diagnosis of SARS-CoV-2 by FDA under an Emergency Use Authorization (EUA). This EUA will remain  in effect (meaning this test can be used) for the duration of the COVID-19 declaration under Section 564(b)(1) of the Act, 21 U.S.C.section 360bbb-3(b)(1), unless the authorization is terminated  or revoked sooner.       Influenza A by PCR NEGATIVE NEGATIVE Final   Influenza B by PCR NEGATIVE NEGATIVE Final    Comment: (NOTE) The Xpert Xpress SARS-CoV-2/FLU/RSV plus assay is intended as an aid in the diagnosis of influenza from Nasopharyngeal swab specimens and should not be used as a sole basis for treatment. Nasal washings and aspirates are unacceptable for Xpert Xpress SARS-CoV-2/FLU/RSV testing.  Fact Sheet for Patients: EntrepreneurPulse.com.au  Fact  Sheet for Healthcare Providers: IncredibleEmployment.be  This test is not yet approved or cleared by the [place] FDA and has been authorized for detection and/or diagnosis of SARS-CoV-2 by FDA under an Emergency Use Authorization (EUA). This EUA will remain in effect (meaning this test can be used) for the duration of the COVID-19 declaration under Section 564(b)(1) of the Act, 21 U.S.C. section  360bbb-3(b)(1), unless the authorization is terminated or revoked.  Performed at [facility], [place] [address], [place], [place] [address]       Radiology Studies: US RENAL  Result Date: 06/19/2021 CLINICAL DATA:  Acute kidney injury, history hypertension, CHF, smoking, gastric cancer EXAM: RENAL / URINARY TRACT ULTRASOUND COMPLETE COMPARISON:  CT abdomen and pelvis 06/17/2021 FINDINGS: Right Kidney: Renal measurements: 11.3 x 4.6 x 5.3 cm = volume: 144 mL. Normal cortical thickness. Increased cortical echogenicity. No mass, hydronephrosis, or shadowing calcification. Left Kidney: Renal measurements: 11.3 x 5.7 x 5.2 cm = volume: 175 mL. Less well visualized due to suboptimal acoustic window and bowel. Normal cortical thickness. Increased cortical echogenicity. No mass, hydronephrosis, or shadowing calcification. Bladder: Incompletely distended, which may account for slight prominence of the wall. No gross mass. Other: N/A IMPRESSION: No definite renal sonographic abnormalities identified. Electronically Signed   By: Lavonia Dana M.D.   On: 06/19/2021 16:51   MR ABDOMEN MRCP WO CONTRAST  Result Date: 06/20/2021 CLINICAL DATA:  Right upper quadrant and epigastric pain for 7 months. History gastric cancer. Abnormal CT scan. EXAM: MRI ABDOMEN WITHOUT CONTRAST  (INCLUDING MRCP) TECHNIQUE: Multiplanar multisequence MR imaging of the abdomen was performed. Heavily T2-weighted images of the biliary and pancreatic ducts were obtained, and three-dimensional MRCP images were rendered  by post processing. COMPARISON:  Multiple prior imaging studies. Nuclear medicine hepatic biliary scan 06/18/2021 and CT scan 06/17/2021 FINDINGS: This examination is quite limited due to respiratory motion. The patient could not his breath for the examination. Lower chest: Stable cardiac enlargement without pericardial effusion. No obvious pleural effusions or lung base lesions. Hepatobiliary: No obvious hepatic lesions or intrahepatic biliary dilatation. Stable diffuse gallbladder wall thickening likely due to low albumin. No gallstones. Grossly normal caliber and course of the common bile duct. Pancreas:  No mass, inflammation or ductal dilatation. Spleen:  Normal size.  No definite lesions. Adrenals/Urinary Tract: The adrenal glands and kidneys are grossly normal. Stomach/Bowel: No gross abnormality involving the stomach. Difficult to identify the patient has gastric cancer on this study. Duodenum and visualized small-bowel loops are grossly normal. Vascular/Lymphatic: The aorta is normal in caliber. No obvious mesenteric retroperitoneal adenopathy. Other:  No ascites but there is diffuse mesenteric edema. Musculoskeletal: No significant bony findings. IMPRESSION: 1. Very limited examination due to respiratory motion. 2. Stable diffuse gallbladder wall thickening likely due to hepatic dysfunction and low albumin. 3. Grossly normal caliber and course of the common bile duct. 4. No obvious mesenteric or retroperitoneal adenopathy. 5. Difficult to identify the patient's gastric cancer on this study. Electronically Signed   By: Marijo Sanes M.D.   On: 06/20/2021 09:26     Scheduled Meds:  [MAR Hold] aspirin EC  81 mg Oral Daily   [MAR Hold] atorvastatin  80 mg Oral Daily   [MAR Hold] enoxaparin (LOVENOX) injection  30 mg Subcutaneous Q24H   [MAR Hold] fluconazole  200 mg Oral QHS   [MAR Hold] gabapentin  100 mg Oral BID   [MAR Hold] pantoprazole  40 mg Oral BID AC   [MAR Hold] sodium zirconium  cyclosilicate  10 g Oral BID   Continuous Infusions:  sodium chloride 75 mL/hr at 06/19/21 2310   sodium chloride 20 mL/hr at 06/20/21 1219   [MAR Hold] piperacillin-tazobactam (ZOSYN)  IV 3.375 g (06/20/21 0655)     LOS: 3 days   Time Spent in minutes   45 minutes  Calob Baskette D.O. on 06/20/2021 at 2:29 PM  Between 7am to 7pm - Please see pager noted on [email or web address]  After 7pm go to [email or web address]  And look for the night coverage person covering for me after hours  [facility] Office  [phone]

## 2021-06-20 NOTE — Anesthesia Postprocedure Evaluation (Signed)
Anesthesia Post Note  Patient: Tyler Wong  Procedure(s) Performed: ESOPHAGOGASTRODUODENOSCOPY (EGD) BIOPSY     Patient location during evaluation: Endoscopy Anesthesia Type: General Level of consciousness: awake and alert Pain management: pain level controlled Vital Signs Assessment: post-procedure vital signs reviewed and stable Respiratory status: spontaneous breathing, nonlabored ventilation and respiratory function stable Cardiovascular status: blood pressure returned to baseline and stable Postop Assessment: no apparent nausea or vomiting Anesthetic complications: no   No notable events documented.  Last Vitals:  Vitals:   06/20/21 1453 06/20/21 1510  BP: 106/77 106/73  Pulse:  92  Resp: (!) 22 16  Temp:    SpO2:  96%    Last Pain:  Vitals:   06/20/21 1400  TempSrc: Axillary  PainSc: 0-No pain                 Candra R Madison Direnzo

## 2021-06-20 NOTE — Progress Notes (Signed)
Patient's endoscopy was marginally tolerated: Despite a low-dose pressure infusion, he became moderately hypotensive during the procedure, with pressures in the 70s, and O2 sat dropping into the mid 80s.  His endoscopy had a somewhat surprising finding of a large amount of retained food in the stomach, despite a wide open pylorus, with no evidence of an anatomic gastric outlet obstruction.  Please see dictated report.  I did not see any obvious residual gastric cancer but a moderately large portion of the stomach was obscured by residual food.  No obvious source of abdominal pain is present, although notably his symptoms are worse after meals, which could be related to the retained food in the stomach.  Recommendations:  1.  Switch to full liquid diet  2.  Initiate metoclopramide and low-dose (5 mg IV every 6 hours).  If this is tolerated, in a day or so I would go up to 10 mg IV every 6 hours.  3.  Await results of random duodenal and gastric biopsies  4.  Continue to monitor elevated liver chemistries but for now I do not think any further imaging or testing for that is called for.  5.  Depending on patient's clinical evolution, repeat endoscopy could be considered in several days, after hopefully the retained food has cleared out.  However, as mentioned above, his tolerance of the procedure was marginal and it would be I relatively high risk undertaking, so the benefit of the procedure would have to be carefully weighed against the risk.   Cleotis Nipper, M.D. Pager [phone] If no answer or after 5 PM call [phone]

## 2021-06-20 NOTE — Interval H&P Note (Signed)
History and Physical Interval Note:  06/20/2021 1:30 PM  Tyler Wong  has presented today for surgery, with the diagnosis of abdominal pain, melena, history of gastric cancer.  The various methods of treatment have been discussed with the patient. After consideration of risks, benefits and other options for treatment, the patient has consented to  Procedure(s): ESOPHAGOGASTRODUODENOSCOPY (EGD) (N/A) as a surgical intervention.  The patient's history has been reviewed, patient examined, no change in status, stable for surgery.  I have reviewed the patient's chart and labs.  Questions were answered to the patient's satisfaction.     Youlanda Mighty Pacen [place]

## 2021-06-20 NOTE — Progress Notes (Signed)
HEMATOLOGY-ONCOLOGY PROGRESS NOTE  SUBJECTIVE: Tyler Wong continues to have abdominal pain.  The pain is transiently relieved with fentanyl.  He has noted increased leg swelling.  He relates this to not taking his diuretic.  Oncology History  Gastric cancer ([place])  11/29/2020 Initial Diagnosis   Gastric cancer ([place])    12/12/2020 -  Chemotherapy    Patient is on Treatment Plan: GASTROESOPHAGEAL FOLFOX Q14D X 6 CYCLES        PHYSICAL EXAMINATION:  Vitals:   06/20/21 0500 06/20/21 0904  BP: (!) 131/103 92/70  Pulse: 99 99  Resp: 20 20  Temp: 98.1 F (36.7 C) 97.6 F (36.4 C)  SpO2: 99% 99%   Filed Weights   06/17/21 1939 06/20/21 0500  Weight: 165 lb 5.5 oz (75 kg) 175 lb 3.2 oz (79.5 kg)    Intake/Output from previous day: 06/23 0701 - 06/24 0700 In: 2238.3 [P.O.:720; I.V.:1265.4; IV Piggyback:252.9] Out: -  HEENT: No thrush ABDOMEN: Mildly distended, tender in the upper abdomen NEURO: alert & oriented x 3 with fluent speech, no focal motor/sensory deficits Vascular: Trace pitting edema at the lower leg and foot bilaterally  LABORATORY DATA:  I have reviewed the data as listed CMP Latest Ref Rng & Units 06/20/2021 06/19/2021 06/18/2021  Glucose 70 - 99 mg/dL 103(H) 146(H) 157(H)  BUN 6 - 20 mg/dL 75(H) 83(H) 66(H)  Creatinine 0.61 - 1.24 mg/dL 2.82(H) 3.42(H) 2.70(H)  Sodium 135 - 145 mmol/L 127(L) 128(L) 131(L)  Potassium 3.5 - 5.1 mmol/L 5.0 6.1(H) 5.8(H)  Chloride 98 - 111 mmol/L 95(L) 92(L) 97(L)  CO2 22 - 32 mmol/L 22 23 21(L)  Calcium 8.9 - 10.3 mg/dL 8.0(L) 8.8(L) 8.5(L)  Total Protein 6.5 - 8.1 g/dL 6.5 6.2(L) 6.4(L)  Total Bilirubin 0.3 - 1.2 mg/dL 2.1(H) 3.4(H) 2.4(H)  Alkaline Phos 38 - 126 U/L 145(H) 142(H) 139(H)  AST 15 - 41 U/L 118(H) 155(H) 46(H)  ALT 0 - 44 U/L 122(H) 122(H) 38    Lab Results  Component Value Date   WBC 10.1 06/20/2021   HGB 12.0 (L) 06/20/2021   HCT 36.1 (L) 06/20/2021   MCV 78.6 (L) 06/20/2021   PLT 314 06/20/2021    NEUTROABS 8.1 (H) 06/18/2021    CT ABDOMEN PELVIS WO CONTRAST  Result Date: 06/17/2021 CLINICAL DATA:  Sepsis Abdominal pain, acute, nonlocalized. Dx with "abdominal" cancer and had PET scan yesterday and oncologist called him today and said he has an inflamed gal bladder and that he should go to the ED EXAM: CT ABDOMEN AND PELVIS WITHOUT CONTRAST TECHNIQUE: Multidetector CT imaging of the abdomen and pelvis was performed following the standard protocol without IV contrast. COMPARISON:  Ultrasound abdomen 06/17/2021, CT abdomen pelvis 06/16/2021, CT abdomen pelvis 02/20/2021, PET CT 11/28/2020 FINDINGS: Lower chest: Cardiomegaly.  Persistent trace pericardial effusion. Hepatobiliary: No focal liver abnormality. Gallbladder wall thickening and haziness. No CT evidence of a calcified stone. No biliary dilatation. Pancreas: No focal lesion. Normal pancreatic contour. No surrounding inflammatory changes. No main pancreatic ductal dilatation. Spleen: Normal in size without focal abnormality. Adrenals/Urinary Tract: No adrenal nodule bilaterally. No nephrolithiasis, no hydronephrosis, and no contour-deforming renal mass. No ureterolithiasis or hydroureter. The urinary bladder is unremarkable. Stomach/Bowel: PO contrast reaches the rectum. Haziness as well as question of wall thickening of the distal duodenum with associated fat stranding and free fluid within the upper abdomen. Stomach is within normal limits. No evidence of large bowel wall thickening or dilatation. The appendix not definitely identified. Vascular/Lymphatic: No abdominal aorta  or iliac aneurysm. Mild atherosclerotic plaque of the aorta and its branches. No abdominal, pelvic, or inguinal lymphadenopathy. Reproductive: Prostate is unremarkable. Other: No intraperitoneal free fluid. No intraperitoneal free gas. No organized fluid collection. Musculoskeletal: No abdominal wall hernia or abnormality. No suspicious lytic or blastic osseous lesions. No  acute displaced fracture. IMPRESSION: 1. Findings concerning for acalculous acute cholecystitis. Differential diagnosis of nonspecific stand alone gallbladder wall thickening includes chronic liver disease versus malignancy. A HIDA scan could be obtained for further evaluation of likely acalculous acute cholecystitis. 2. Haziness as well as question of wall thickening of the distal duodenum with associated fat stranding and free fluid within the upper abdomen. Finding may represent reactive changes versus duodenitis. 3. Given upper abdominal fat stranding in the vicinity of the pancreas, consider evaluation with lipase levels. 4. Trace simple free fluid ascites. 5. Markedly limited evaluation due to noncontrast study in a patient with known malignancy. Electronically Signed   By: Iven Finn M.D.   On: 06/17/2021 22:23   CT ABDOMEN PELVIS WO CONTRAST  Result Date: 06/17/2021 CLINICAL DATA:  Restaging gastric cancer, malignancy of pyloric antrum, ongoing chemotherapy EXAM: CT ABDOMEN AND PELVIS WITHOUT CONTRAST TECHNIQUE: Multidetector CT imaging of the abdomen and pelvis was performed following the standard protocol without IV contrast. Oral enteric contrast was administered. COMPARISON:  02/20/2021 FINDINGS: Examination is generally limited by breath motion artifact and lack of intravenous contrast. Lower chest: No acute abnormality. Hepatobiliary: No solid liver abnormality is seen. The gallbladder wall appears thickened (series 2, image 29). No visible gallstones or biliary ductal dilatation. Pancreas: Unremarkable. No pancreatic ductal dilatation. Spleen: Normal in size without significant abnormality. Adrenals/Urinary Tract: Adrenal glands are unremarkable. Kidneys are normal, without renal calculi, solid lesion, or hydronephrosis. Bladder is unremarkable. Stomach/Bowel: No evidence of residual or recurrent mass of the gastric antrum (series 2, image 29). There is new, ill-defined fat stranding about  the descending and transverse duodenum as well as the central small bowel mesentery (series 2, image 32, 37). Appendix appears normal. Vascular/Lymphatic: No significant vascular findings are present. No enlarged abdominal or pelvic lymph nodes. Reproductive: No mass or other significant abnormality. Other: No abdominal wall hernia or abnormality. New small volume ascites throughout the abdomen and pelvis. Musculoskeletal: No acute or significant osseous findings. IMPRESSION: 1. Examination is generally limited by breath motion artifact and lack of intravenous contrast. 2. Within this limitation, no evidence of residual or recurrent mass of the gastric antrum. 3. The gallbladder wall appears thickened. No visible gallstones or biliary ductal dilatation. Findings are concerning for acute cholecystitis. 4. There is new, ill-defined fat stranding about the descending and transverse duodenum as well as the central small bowel mesentery, likely reactive to findings of the adjacent gallbladder. 5. New small volume ascites throughout the abdomen and pelvis. These results will be called to the ordering clinician or representative by the Radiologist Assistant, and communication documented in the PACS or [employer]. Electronically Signed   By: Eddie Candle M.D.   On: 06/17/2021 08:59   NM Hepatobiliary Liver Func  Result Date: 06/18/2021 CLINICAL DATA:  Elevated bilirubin: 2.4 (06/18/21) HPI: 55 year old male with past medical history of gastric cancer (Dx 10/2010), chronic kidney disease stage IIIb, EXAM: NUCLEAR MEDICINE HEPATOBILIARY IMAGING TECHNIQUE: Sequential images of the abdomen were obtained out to 60 minutes following intravenous administration of radiopharmaceutical. RADIOPHARMACEUTICALS:  7.6 mCi Tc-30m Choletec IV COMPARISON:  CT 621 22 FINDINGS: Prompt uptake and biliary excretion of activity by the liver is seen.  Gallbladder activity is visualized, consistent with patency of cystic duct. Biliary  activity passes into small bowel, consistent with patent common bile duct. IMPRESSION: 1. Patent cystic duct and common bile duct. 2. No evidence acute cholecystitis. Electronically Signed   By: Suzy Bouchard M.D.   On: 06/18/2021 14:42   US RENAL  Result Date: 06/19/2021 CLINICAL DATA:  Acute kidney injury, history hypertension, CHF, smoking, gastric cancer EXAM: RENAL / URINARY TRACT ULTRASOUND COMPLETE COMPARISON:  CT abdomen and pelvis 06/17/2021 FINDINGS: Right Kidney: Renal measurements: 11.3 x 4.6 x 5.3 cm = volume: 144 mL. Normal cortical thickness. Increased cortical echogenicity. No mass, hydronephrosis, or shadowing calcification. Left Kidney: Renal measurements: 11.3 x 5.7 x 5.2 cm = volume: 175 mL. Less well visualized due to suboptimal acoustic window and bowel. Normal cortical thickness. Increased cortical echogenicity. No mass, hydronephrosis, or shadowing calcification. Bladder: Incompletely distended, which may account for slight prominence of the wall. No gross mass. Other: N/A IMPRESSION: No definite renal sonographic abnormalities identified. Electronically Signed   By: Lavonia Dana M.D.   On: 06/19/2021 16:51   MR ABDOMEN MRCP WO CONTRAST  Result Date: 06/20/2021 CLINICAL DATA:  Right upper quadrant and epigastric pain for 7 months. History gastric cancer. Abnormal CT scan. EXAM: MRI ABDOMEN WITHOUT CONTRAST  (INCLUDING MRCP) TECHNIQUE: Multiplanar multisequence MR imaging of the abdomen was performed. Heavily T2-weighted images of the biliary and pancreatic ducts were obtained, and three-dimensional MRCP images were rendered by post processing. COMPARISON:  Multiple prior imaging studies. Nuclear medicine hepatic biliary scan 06/18/2021 and CT scan 06/17/2021 FINDINGS: This examination is quite limited due to respiratory motion. The patient could not his breath for the examination. Lower chest: Stable cardiac enlargement without pericardial effusion. No obvious pleural effusions or  lung base lesions. Hepatobiliary: No obvious hepatic lesions or intrahepatic biliary dilatation. Stable diffuse gallbladder wall thickening likely due to low albumin. No gallstones. Grossly normal caliber and course of the common bile duct. Pancreas:  No mass, inflammation or ductal dilatation. Spleen:  Normal size.  No definite lesions. Adrenals/Urinary Tract: The adrenal glands and kidneys are grossly normal. Stomach/Bowel: No gross abnormality involving the stomach. Difficult to identify the patient has gastric cancer on this study. Duodenum and visualized small-bowel loops are grossly normal. Vascular/Lymphatic: The aorta is normal in caliber. No obvious mesenteric retroperitoneal adenopathy. Other:  No ascites but there is diffuse mesenteric edema. Musculoskeletal: No significant bony findings. IMPRESSION: 1. Very limited examination due to respiratory motion. 2. Stable diffuse gallbladder wall thickening likely due to hepatic dysfunction and low albumin. 3. Grossly normal caliber and course of the common bile duct. 4. No obvious mesenteric or retroperitoneal adenopathy. 5. Difficult to identify the patient's gastric cancer on this study. Electronically Signed   By: Marijo Sanes M.D.   On: 06/20/2021 09:26   DG Chest Port 1 View  Result Date: 06/17/2021 CLINICAL DATA:  Nausea EXAM: PORTABLE CHEST 1 VIEW COMPARISON:  05/14/2021 FINDINGS: Cardiac shadow is enlarged but stable. Left chest wall port is again seen. Lungs are well aerated bilaterally. No focal infiltrate or sizable effusion is seen. No acute bony abnormality is noted. IMPRESSION: Stable cardiomegaly.  No focal infiltrate is seen. Electronically Signed   By: Inez Catalina M.D.   On: 06/17/2021 20:27   US Abdomen Limited RUQ (LIVER/GB)  Result Date: 06/17/2021 CLINICAL DATA:  Right upper quadrant pain EXAM: ULTRASOUND ABDOMEN LIMITED RIGHT UPPER QUADRANT COMPARISON:  CT 06/16/2021 FINDINGS: Gallbladder: No shadowing stones. Increased  gallbladder wall thickness up  to 6 mm. Negative sonographic Murphy. Common bile duct: Diameter: 3.1 mm Liver: Echogenicity within normal limits. Shadowing calcification within the right hepatic lobe. Portal vein is patent on color Doppler imaging with normal direction of blood flow towards the liver. Other: None. IMPRESSION: 1. Gallbladder wall thickening without shadowing stone or sonographic Murphy. Findings are nonspecific. If acute gallbladder disease remains a concern, further evaluation with hepatobiliary nuclear medicine imaging could be obtained Electronically Signed   By: Donavan Foil M.D.   On: 06/17/2021 20:09    ASSESSMENT AND PLAN: Gastric cancer CT abdomen/pelvis 11/01/2020-masslike area measuring 6.3 x 5.6 x 5.6 cm in the antral prepyloric region of the stomach Upper endoscopy 11/05/2020-patchy white plaques in the distal esophagus; 3 nonbleeding cratered gastric ulcers with pigmented material in the gastric antrum largest 10 mm; segmental moderate inflammation characterized by congestion, erosions and erythema in the gastric antrum.  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  No lymphadenopathy or metastatic disease in the abdomen or pelvis. CHF, LVEF 20-25% 10/04/2020 CAD status post prior LAD stent COPD Chronic kidney disease Cocaine usage Port-A-Cath placement  12/06/2020, Interventional Radiology Mild neutropenia secondary to chemotherapy-Udenyca added with cycle 5 FOLFOX Hospital admission 05/13/2021- acute exacerbation of CHF/end-stage heart failure Hospital admission 06/17/2021-abdominal pain  Tyler Wong continues to have abdominal pain.  A CT and MRI revealed no clear evidence of progressive gastric cancer.  I remain concerned his pain is related to gastric cancer.  He is scheduled for an upper endoscopy today.  It is possible the pain is related to heart disease. He is scheduled to begin outpatient radiation to the stomach next week.  We will place this on hold pending further evaluation of the abdominal pain.   I will add an oral narcotic to use as needed for pain.  His kidney function and hyperbilirubinemia are partially improved today.  Recommendations: 1.  Proceed with upper endoscopy today 2.  Oxycodone as needed for pain 3.  Resume diuretic when okay per the medical service. 4.  Please call Oncology as needed over the weekend.  I will check on him 06/23/2021 if he remains in the hospital.  Outpatient follow-up will be scheduled at the Cancer center.   LOS: 3 days   Betsy Coder, MD 06/20/21

## 2021-06-20 NOTE — Progress Notes (Signed)
   06/20/21 0904  Assess: MEWS Score  Temp 97.6 F (36.4 C)  BP 92/70  Pulse Rate 99  ECG Heart Rate (!) 105  Resp 20  Level of Consciousness Alert  SpO2 99 %  O2 Device Room Air  Assess: MEWS Score  MEWS Temp 0  MEWS Systolic 1  MEWS Pulse 1  MEWS RR 0  MEWS LOC 0  MEWS Score 2  MEWS Score Color Yellow  Assess: if the MEWS score is Yellow or Red  Were vital signs taken at a resting state? Yes  Focused Assessment No change from prior assessment  Early Detection of Sepsis Score *See Row Information* High  MEWS guidelines implemented *See Row Information* Yes  Treat  MEWS Interventions Escalated (See documentation below)  Take Vital Signs  Increase Vital Sign Frequency  Yellow: Q 2hr X 2 then Q 4hr X 2, if remains yellow, continue Q 4hrs  Escalate  MEWS: Escalate Yellow: discuss with charge nurse/RN and consider discussing with provider and RRT  Notify: Charge Nurse/RN  Name of Charge Nurse/RN Notified Chrissy Rosana Hoes  Date Charge Nurse/RN Notified 06/20/21  Time Charge Nurse/RN Notified [id number]  Document  Patient Outcome Other (Comment) (stable; remains on department)  Progress note created (see row info) Yes

## 2021-06-20 NOTE — Transfer of Care (Signed)
Immediate Anesthesia Transfer of Care Note  Patient: Tyler Wong  Procedure(s) Performed: ESOPHAGOGASTRODUODENOSCOPY (EGD) BIOPSY  Patient Location: PACU  Anesthesia Type:MAC  Level of Consciousness: sedated and patient cooperative  Airway & Oxygen Therapy: Patient Spontanous Breathing and Patient connected to nasal cannula oxygen  Post-op Assessment: Report given to RN, Post -op Vital signs reviewed and stable and on neo gtt.  Post vital signs: Reviewed and stable  Last Vitals:  Vitals Value Taken Time  BP 103/67 06/20/21 1407  Temp 36.6 C 06/20/21 1400  Pulse 96 06/20/21 1408  Resp 24 06/20/21 1408  SpO2 93 % 06/20/21 1408  Vitals shown include unvalidated device data.  Last Pain:  Vitals:   06/20/21 1400  TempSrc: Axillary  PainSc: 0-No pain      Patients Stated Pain Goal: 4 (51/88/41 6606)  Complications: No notable events documented.

## 2021-06-20 NOTE — TOC Initial Note (Addendum)
Transition of Care [facility]) - Initial/Assessment Note    Patient Details  Name: Tyler Wong MRN: [id number] Date of Birth: 1966/04/28  Transition of Care [facility]) CM/SW Contact:    [name], RN Phone Number: 06/20/2021, 9:56 AM  Clinical Narrative: Risk for readmission assessment completed. Case Manager spoke with patient and he has Medicaid- patient states he gets his medications for less than $3.00 and sometimes the cost is waived for free. Patient states he has been staying in a Materials [place] via the [employer]. Case Manager called the congregational RN to verify that the patient has a motel room via [employer]. Awaiting to hear back from them. Case Manager will continue to follow.             [place] 06-20-21 Case Manager received a phone call from [name] the [facility], patient is currently housed in a motel via [employer] and the patient will return once stable. TOC Staff will need to call [name] at [phone] when the patient is ready to transition home. If [facility] is not available when the patient transitions home, patient's sister [name] can assist with transportation. CMA will make a hospital follow up appointment for the patient post hospitalization. Case Manager will continue to follow for additional needs.   Expected Discharge Plan: Home/Self Care ([facility]- [place] Barriers to Discharge: Continued Medical Work up   Patient Goals and CMS Choice Patient states their goals for this hospitalization and ongoing recovery are:: to feel better   Choice offered to / list presented to : NA  Expected Discharge Plan and Services Expected Discharge Plan: Home/Self Care ([facility]- living in a [place].) In-house Referral:  Acupuncturist) Discharge Planning Services: CM Consult Post Acute Care Choice:  ([place].) Living arrangements for the past 2 months: Hotel/Motel Medical illustrator)                    DME Agency: NA       HH Arranged: NA          Prior Living Arrangements/Services Living arrangements for the past 2 months: Hotel/Motel Medical illustrator) Lives with:: Self Patient language and need for interpreter reviewed:: Yes Do you feel safe going back to the place where you live?: Yes      Need for Family Participation in Patient Care: No (Comment) Care giver support system in place?: No (comment)   Criminal Activity/Legal Involvement Pertinent to Current Situation/Hospitalization: No - Comment as needed  Activities of Daily Living Home Assistive Devices/Equipment: None ADL Screening (condition at time of admission) Patient's cognitive ability adequate to safely complete daily activities?: Yes Is the patient deaf or have difficulty hearing?: No Does the patient have difficulty seeing, even when wearing glasses/contacts?: No Does the patient have difficulty concentrating, remembering, or making decisions?: No Patient able to express need for assistance with ADLs?: No Does the patient have difficulty dressing or bathing?: No Independently performs ADLs?: Yes (appropriate for developmental age) Does the patient have difficulty walking or climbing stairs?: No Weakness of Legs: None Weakness of Arms/Hands: None  Permission Sought/Granted Permission sought to share information with : Facility Sport and exercise psychologist, Case Optician, dispensing granted to share information with : Yes, Verbal Permission Granted     Permission granted to share info w AGENCY: [facility]        Emotional Assessment Appearance:: Appears stated age Attitude/Demeanor/Rapport: Engaged Affect (typically observed): Appropriate Orientation: : Oriented to Self, Oriented to  Time, Oriented  to Place, Oriented to Situation Alcohol / Substance Use: Not Applicable Psych Involvement: No (comment)  Admission diagnosis:  Hyponatremia [E87.1] RUQ abdominal pain [R10.11] SIRS (systemic inflammatory  response syndrome) (HCC) [R65.10] AKI (acute kidney injury) ([place]) [N17.9] Thickening of wall of gallbladder [K82.8] Patient Active Problem List   Diagnosis Date Noted   Hyperkalemia 06/18/2021   Lactic acidosis 06/18/2021   RUQ abdominal pain    Acute acalculous cholecystitis 06/17/2021   Cocaine abuse ([place]) 06/17/2021   COPD (chronic obstructive pulmonary disease) ([place]) 06/17/2021   Esophageal candidiasis ([place]) 06/17/2021   Port-A-Cath in place 05/22/2021   Acute dyspnea    CHF exacerbation ([place]) 05/13/2021   Gastric cancer ([place]) 11/29/2020   Abdominal pain, generalized 11/05/2020   Congestive heart failure (CHF) ([place]) 11/01/2020   CHF (congestive heart failure) ([place]) 10/31/2020   Palliative care by specialist    Goals of care, counseling/discussion    Acute systolic HF (heart failure) ([place]) 10/03/2020   Acute on chronic systolic CHF (congestive heart failure) ([place]) 10/03/2020   Acute renal failure superimposed on stage 3b chronic kidney disease ([place])    Acute respiratory disease due to COVID-19 virus 09/20/2020   Acute respiratory failure due to COVID-19 ([name]) 09/19/2020   Cardiorenal syndrome 06/29/2020   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   Acute on chronic clinical systolic heart failure ([place]) 06/22/2020   Acute on chronic systolic (congestive) heart failure ([place]) 06/22/2020   Palliative care encounter    Cardiogenic shock ([place]) 04/11/2020   CKD (chronic kidney disease) stage 3, GFR 30-59 ml/min (HCC) 04/11/2020   Symptomatic anemia 04/09/2020   Acute on chronic HFrEF (heart failure with reduced ejection fraction) ([place]) 05/05/2019   HFrEF (heart failure with reduced ejection fraction) ([place]) 05/04/2019   Acute combined systolic and diastolic congestive heart failure ([place]) 10/02/2018   Hepatitis C 04/13/2017   Current non-adherence to medical treatment 04/13/2017   Acute on chronic combined systolic (congestive) and diastolic (congestive) heart failure  ([place]) 04/13/2017   HTN (hypertension) 04/12/2017   Insomnia 01/18/2017   Chronic combined systolic and diastolic congestive heart failure ([place]) 01/04/2017   AKI (acute kidney injury) ([place]) 01/04/2017   Hyperlipidemia 01/04/2017   Chest pain 12/30/2016   Coronary artery disease involving native coronary artery of native heart without angina pectoris 07/18/2014   Tobacco abuse 07/17/2014   Cocaine use 07/17/2014   PCP:  [name], NP Pharmacy:   [place], [place] - [address] [place] [address] Phone: [phone] Fax: [phone]  [facility] [address] [place] [address] Phone: [phone] Fax: [place] [address] Phone: [phone] Fax: [place] [address] [place] [address] Phone: [phone] Fax: [phone]    Readmission Risk Interventions Readmission Risk Prevention Plan 06/20/2021 05/20/2021 11/04/2020  Transportation Screening Complete Complete Complete  Medication Review Press photographer) Complete - Complete  PCP or Specialist appointment within 3-5 days of discharge Complete Complete Complete  HRI or Home Care Consult Complete Complete Complete  SW Recovery Care/Counseling Consult Complete Complete Complete  Palliative Care Screening Not Applicable Not Applicable Not [place] Not Applicable Not Applicable Not Applicable  Some recent data might be hidden

## 2021-06-20 NOTE — Op Note (Signed)
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 Estimated Blood Loss:     Estimated blood loss was minimal. Procedure:                Pre-Anesthesia Assessment:                           - Prior to the procedure, a History and Physical                            was performed, and patient medications and                            allergies were reviewed. The patient's tolerance of                            previous anesthesia was also reviewed. The risks                            and benefits of the procedure and the sedation                            options and risks were discussed with the patient.                            All questions were answered, and informed consent                            was obtained. Prior Anticoagulants: The patient has                            taken Lovenox (enoxaparin), last dose was day of                            procedure. ASA Grade Assessment: III - A patient  with severe systemic disease. After reviewing the                            risks and benefits, the patient was deemed in                            satisfactory condition to undergo the procedure.                           After obtaining informed consent, the endoscope was                            passed under direct vision. Throughout the                             procedure, the patient's blood pressure, pulse, and                            oxygen saturations were monitored continuously. The                            GIF-H190 ([id number]) Olympus gastroscope was                            introduced through the mouth, and advanced to the                            second part of duodenum. The upper GI endoscopy was                            accomplished without difficulty. The patient                            tolerated the procedure fairly well--he was                            HYPOTENSIVE and required pressors. His O2 sat went                            down to about 85%. Scope In: Scope Out: Findings:      The examined esophagus was normal.      A large amount of food (residue) was found in the gastric body and on       the anterior wall of the gastric body, obscuring visualization of about       1/3 of the stomach. Pylorus was widely patent.      The exam of the stomach was otherwise normal. No obvious tumor or       ulceration observed, but could have been missed due to overlying food.      The examined duodenum was normal. Biopsies were taken with a cold       forceps for histology.      Biopsies were taken with a cold forceps in the gastric antrum for       histology. Impression:               -  Normal esophagus.                           - A large amount of food (residue) in the stomach,                            without anatomic gastric outlet obstruction. Not                            clear if this is somehow related to his diffuse                            abdominal pain, which is worse post-prandially.                           - Normal examined duodenum. Biopsied.                           - Biopsies were also taken with a cold forceps for                            histology in the gastric antrum.                           - Marginal tolerance of exam in this patient with                            severe cardiomyopathy. Could  consider repeat egd                            for more definitive evaluation of the stomach and                            more extensive biopsies, but it would be moderately                            high risk in view of the marginal tolerance of this                            procedure. Recommendation:           - Await pathology results.                           - Trial of metoclopramide 5 mg IV QID to promote                            gastric emptying; if tolerated, consider going up                            to 10 mg QID.                           - Cou Procedure Code(s):        --- Professional ---  27639, Esophagogastroduodenoscopy, flexible,                            transoral; with biopsy, single or multiple Diagnosis Code(s):        --- Professional ---                           R10.84, Generalized abdominal pain                           C16.9, Malignant neoplasm of stomach, unspecified CPT copyright 2019 American Medical Association. All rights reserved. The codes documented in this report are preliminary and upon coder review may  be revised to meet current compliance requirements. Ronald Lobo, MD 06/20/2021 1:57:54 PM This report has been signed electronically. Number of Addenda: 0

## 2021-06-21 ENCOUNTER — Inpatient Hospital Stay: Payer: Self-pay

## 2021-06-21 ENCOUNTER — Inpatient Hospital Stay (HOSPITAL_COMMUNITY): Payer: Medicaid Other

## 2021-06-21 DIAGNOSIS — Z7189 Other specified counseling: Secondary | ICD-10-CM

## 2021-06-21 DIAGNOSIS — I5023 Acute on chronic systolic (congestive) heart failure: Secondary | ICD-10-CM

## 2021-06-21 DIAGNOSIS — R570 Cardiogenic shock: Secondary | ICD-10-CM

## 2021-06-21 DIAGNOSIS — Z515 Encounter for palliative care: Secondary | ICD-10-CM

## 2021-06-21 DIAGNOSIS — C163 Malignant neoplasm of pyloric antrum: Secondary | ICD-10-CM | POA: Diagnosis not present

## 2021-06-21 LAB — CBC
HCT: 36.3 % — ABNORMAL LOW (ref 39.0–52.0)
Hemoglobin: 11.9 g/dL — ABNORMAL LOW (ref 13.0–17.0)
MCH: 26.2 pg (ref 26.0–34.0)
MCHC: 32.8 g/dL (ref 30.0–36.0)
MCV: 79.8 fL — ABNORMAL LOW (ref 80.0–100.0)
Platelets: 297 10*3/uL (ref 150–400)
RBC: 4.55 MIL/uL (ref 4.22–5.81)
RDW: 17.8 % — ABNORMAL HIGH (ref 11.5–15.5)
WBC: 8.6 10*3/uL (ref 4.0–10.5)
nRBC: 0.7 % — ABNORMAL HIGH (ref 0.0–0.2)

## 2021-06-21 LAB — COMPREHENSIVE METABOLIC PANEL
ALT: 111 U/L — ABNORMAL HIGH (ref 0–44)
AST: 99 U/L — ABNORMAL HIGH (ref 15–41)
Albumin: 3 g/dL — ABNORMAL LOW (ref 3.5–5.0)
Alkaline Phosphatase: 143 U/L — ABNORMAL HIGH (ref 38–126)
Anion gap: 12 (ref 5–15)
BUN: 67 mg/dL — ABNORMAL HIGH (ref 6–20)
CO2: 21 mmol/L — ABNORMAL LOW (ref 22–32)
Calcium: 8.3 mg/dL — ABNORMAL LOW (ref 8.9–10.3)
Chloride: 96 mmol/L — ABNORMAL LOW (ref 98–111)
Creatinine, Ser: 2.44 mg/dL — ABNORMAL HIGH (ref 0.61–1.24)
GFR, Estimated: 30 mL/min — ABNORMAL LOW (ref >60)
Glucose, Bld: 129 mg/dL — ABNORMAL HIGH (ref 70–99)
Potassium: 4.7 mmol/L (ref 3.5–5.1)
Sodium: 129 mmol/L — ABNORMAL LOW (ref 135–145)
Total Bilirubin: 1.9 mg/dL — ABNORMAL HIGH (ref 0.3–1.2)
Total Protein: 6.7 g/dL (ref 6.5–8.1)

## 2021-06-21 LAB — RAPID URINE DRUG SCREEN, HOSP PERFORMED
Amphetamines: NOT DETECTED
Barbiturates: NOT DETECTED
Benzodiazepines: POSITIVE — AB
Cocaine: POSITIVE — AB
Opiates: NOT DETECTED
Tetrahydrocannabinol: NOT DETECTED

## 2021-06-21 LAB — TROPONIN I (HIGH SENSITIVITY)
Troponin I (High Sensitivity): 622 ng/L (ref <18)
Troponin I (High Sensitivity): 641 ng/L (ref <18)
Troponin I (High Sensitivity): 723 ng/L (ref <18)
Troponin I (High Sensitivity): 608 ng/L (ref <18)

## 2021-06-21 LAB — LACTIC ACID, PLASMA: Lactic Acid, Venous: 3.2 mmol/L (ref 0.5–1.9)

## 2021-06-21 LAB — BRAIN NATRIURETIC PEPTIDE: B Natriuretic Peptide: 2176.6 pg/mL — ABNORMAL HIGH (ref 0.0–100.0)

## 2021-06-21 MED ORDER — IPRATROPIUM-ALBUTEROL 0.5-2.5 (3) MG/3ML IN SOLN
3.0000 mL | Freq: Once | RESPIRATORY_TRACT | Status: AC
  Administered 2021-06-21: 3 mL via RESPIRATORY_TRACT
  Filled 2021-06-21: qty 3

## 2021-06-21 MED ORDER — FUROSEMIDE 10 MG/ML IJ SOLN
40.0000 mg | Freq: Every day | INTRAMUSCULAR | Status: DC
  Administered 2021-06-21: 40 mg via INTRAVENOUS
  Filled 2021-06-21: qty 4

## 2021-06-21 MED ORDER — SODIUM CHLORIDE 0.9 % IV SOLN
INTRAVENOUS | Status: DC | PRN
  Administered 2021-06-21: 250 mL via INTRAVENOUS

## 2021-06-21 MED ORDER — FUROSEMIDE 10 MG/ML IJ SOLN
40.0000 mg | Freq: Once | INTRAMUSCULAR | Status: AC
  Administered 2021-06-21: 40 mg via INTRAVENOUS
  Filled 2021-06-21: qty 4

## 2021-06-21 MED ORDER — FUROSEMIDE 10 MG/ML IJ SOLN
160.0000 mg | Freq: Two times a day (BID) | INTRAVENOUS | Status: DC
  Administered 2021-06-21 – 2021-06-23 (×4): 160 mg via INTRAVENOUS
  Filled 2021-06-21: qty 16
  Filled 2021-06-21: qty 2
  Filled 2021-06-21 (×4): qty 16

## 2021-06-21 MED ORDER — ENOXAPARIN SODIUM 40 MG/0.4ML IJ SOSY
40.0000 mg | PREFILLED_SYRINGE | INTRAMUSCULAR | Status: DC
  Administered 2021-06-21 – 2021-06-23 (×3): 40 mg via SUBCUTANEOUS
  Filled 2021-06-21 (×3): qty 0.4

## 2021-06-21 MED ORDER — NITROGLYCERIN 2 % TD OINT
0.5000 [in_us] | TOPICAL_OINTMENT | Freq: Four times a day (QID) | TRANSDERMAL | Status: AC
  Administered 2021-06-21: 0.5 [in_us] via TOPICAL
  Filled 2021-06-21: qty 30

## 2021-06-21 NOTE — Progress Notes (Addendum)
TRH night shift.  The nursing staff asked [place] to evaluate the patient due to mild restlessness in the setting of uncomfortable feeling of dyspnea.  He was admitted for acute calculus cholecystitis with associated acute kidney injury on stage IIIb CKD.  He has a history of chronic combined systolic and diastolic heart failure with an EF of 20 to 25%.  He has been mildly tachypneic and tachycardic in the low 100s.  Blood pressure readings in the 100s/70s-80s.  O2 sat ranging from the mid to high 90s on room air.  General: In mild distress due to dyspnea. HEENT: Normocephalic, oral mucosa is moist. Neck: Supple, positive JVD. Lungs: Decreased breath sounds on lower lung fields.  Bibasilar crackles. Cardiovascular: S1-S2, RRR. Abdomen: Soft, nontender. Extremities: 3+ lower extremity edema.  Assessment: Acute dyspnea Likely 2ry to combined systolic/diastolic HF. IV fluids have been discontinue. EKG, BNP, troponin, CMP, CBC, CxR. BiPAP ordered as needed. Declined BiPAP at this time. Patient asked to keep nasal cannula on. Low-dose Nitropaste to ACW. Diuretics deferred in the setting of AKI/soft BP readings.  Tennis Must, MD  Addendum at 0204: I added furosemide 40 mg IVP x1 dose after new chest radiograph showed significant increase in vascularity when compared to the previous 1.  About 55 minutes of critical care time were spent during the process of this urgent event including 2 different visits bedside, chart review, lab and imaging review, coordinating care/treatment with nursing staff and RT.  This document was prepared using Dragon voice recognition software and may contain some unintended transcription errors.

## 2021-06-21 NOTE — Progress Notes (Signed)
[facility] Gastroenterology Progress Note  Tyler Wong 55 y.o. 02/03/1966   Subjective: Sitting up on the side of the bed eating cereal. Denies abdominal pain. Anxious to leave the hospital.  Objective: Vital signs: Vitals:   06/21/21 0800 06/21/21 1000  BP: (!) 89/72 96/75  Pulse:    Resp: 20 18  Temp:  (!) 97.4 F (36.3 C)  SpO2: 98%   P 93  Physical Exam: Gen: no acute distress, thin, lethargic HEENT: anicteric sclera CV: RRR Chest: CTA B Abd: soft, nontender, nondistended, +BS Ext: no edema  Lab Results: Recent Labs    06/20/21 0426 06/21/21 0152  NA 127* 129*  K 5.0 4.7  CL 95* 96*  CO2 22 21*  GLUCOSE 103* 129*  BUN 75* 67*  CREATININE 2.82* 2.44*  CALCIUM 8.0* 8.3*   Recent Labs    06/20/21 0426 06/21/21 0152  AST 118* 99*  ALT 122* 111*  ALKPHOS 145* 143*  BILITOT 2.1* 1.9*  PROT 6.5 6.7  ALBUMIN 2.9* 3.0*   Recent Labs    06/20/21 0426 06/21/21 0152  WBC 10.1 8.6  HGB 12.0* 11.9*  HCT 36.1* 36.3*  MCV 78.6* 79.8*  PLT 314 297      Assessment/Plan: LFTs improving and likely multifactorial. No source seen on MRCP. EGD with large amount of retained food. Continue on full liquid diet. Consider repeat EGD in next 2-3 days if still an inpt to place hemoclips at site of gastric cancer for markings prior to radiation therapy. Supportive care. Will f/u.   Tyler Wong 06/21/2021, 12:00 PM  Questions please call [phone] Patient ID: Tyler Wong, male   DOB: 03-31-1966, 55 y.o.   MRN: [id number]

## 2021-06-21 NOTE — Progress Notes (Signed)
Patient is very restless, and anxious.  Patient is having a hard time breathing at this time. Patient's lung sounds are diminished and crackles in the lower left lobe.  Oxygen applied, however patient keeps pulling it off. Respiratory is at the bedside to give breathing treatment. MD notified, and medications given as MD ordered.  Will continue to monitor.     Donah Driver, RN

## 2021-06-21 NOTE — Progress Notes (Addendum)
PROGRESS NOTE    Tyler Wong  IRS:[id number] DOB: 09-25-1966 DOA: 06/17/2021 PCP: [name], NP   Brief Narrative:  HPI On 06/17/2021 by 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  Patient explains that for the past several weeks he has been experiencing increasing abdominal pain.  Patient describes his abdominal pain is epigastric in location, radiating around the abdomen, severe in intensity, sharp in quality, worse with ingestion of foods and associated with decreased appetite and generalized weakness.  Patient complains of nausea but denies vomiting.  Patient denies fevers.  Patient denies dysuria or diarrhea.  Patient denies recent travel sick contacts or contacts with confirmed COVID-19 infection.   Patient was seen in oncology clinic on 6/20 in preparation for upcoming initiation of radiation therapy on 6/27 during this evaluation patient's symptoms prompted Dr.Sherrill to obtain CT imaging of the abdomen which revealed possible developing acute cholecystitis and therefore the patient was instructed on 6/21 to go to the emergency department for evaluation.     Upon evaluation in the emergency department patient was found to exhibit multiple SIRS criteria including tachycardia and tachypnea in setting of lactic acidosis of 2.6.  ER provider ordered 2 L of lactated Ringer solution bolus for the patient.  Patient was administered intravenous fentanyl for pain.  ER provider discussed case with Dr. Barry Dienes with general surgery who recommended obtaining a HIDA scan, admission to  medicine and formal consultation of surgery if the HIDA scan is abnormal.  The hospitalist group was then called to assess the patient for admission to the hospital.  Interim history Patient presented with abdominal pain found to have acute acalculous cholecystitis.  HIDA scan showed no evidence of acute cholecystitis.  LFTs, alk phos and bilirubin trending upward.  Will discuss with gastroenterology. Assessment & Plan   Severe sepsis secondary to ? acute acalculous cholecystitis with elevated liver enzymes -Present on admission, patient was noted to have tachycardia, tachypnea, with lactic acid of 5.7 -Patient presenting with worsening abdominal pain -CT abdomen pelvis findings concerning for acute  acalculous cholecystitis -Lipase within normal limits -UA unremarkable -HIDA scan showed patent cystic duct and common bile duct.  No evidence of acute cholecystitis. -Continue IV Zosyn -Gastroenterology consulted and appreciated given increase in LFTs, bilirubin and alk phos. Discussed with Dr. Cristina Gong, will order MRCP -MRCP showed stable diffuse gallbladder wall thickening, normal caliber and course of common bile duct.  No obvious mesenteric or retroperitoneal adenopathy. -EGD showed large amount of retained food.  Recommendations were to switch to liquid diet, initiate metoclopramide 5 mg IV every 6 hours, if tolerated increase to 10 mg IV every 6 hours.  Await results of duodenal and gastric biopsies.  May need repeat endoscopy in several days.  Acute hypoxic respiratory failure -Likely secondary to CHF exacerbation -Oxygen saturations dropped 87%- placed on supplemental oxgyen -Treat underlying condition -wean as possible   Acute on Chronic combined systolic and diastolic heart failure -Patient developed shortness of breath overnight -Was on IVF for AKI and elevated LFTs -Echocardiogram 10/04/2020 shows an EF of 00-93%, grade 2 diastolic dysfunction -BNP obtained 2176.6 -CXR showed mild  cardiogenic failure -Monitor intake and output, daily  weights -Will place on IV lasix  Elevated troponin -Likely secondary to CHF exac -will continue to trend -no complaints of chest pain at this time  Acute kidney injury on chronic kidney disease, stage IIIb -Baseline creatinine approximate 1.8-2.1 -On admission, creatinine 2.92- peaked to 3.42 -Creatinine down to 2.44 today -IVF discontinued -Renal US was unremarkable -Continue monitor BMP  Lactic acidosis -Suspect secondary to acalculus cholecystitis -Lactic acid 5.7 on admission- down to 3.2 -Continue broad-spectrum antibiotics -Will continue IV fluids and monitor  Coronary artery disease -Patient currently chest pain-free -Continue aspirin, statin  Hyperkalemia -Suspect secondary to renal function -No EKG changes -Resolved with Lokelma -Continue to monitor BMP  Cocaine abuse -Patient counseled on cessation  COPD -No evidence of exacerbation at this time -Continue bronchodilator therapy as needed  Esophageal candidiasis -Patient with noted thrush on exam -Also complaining of odynophagia -Continue fluconazole for 14 days total -If symptoms do not improve, will consider GI consultation  Hepatitis B/C -Patient had hepatitis panel done in April 2021 and positive Hep B Core Ab in April 2018 -Hepatitis C antibody was reactive, with a quantitative HCV of 28, quantitative log of 1.301 -Will discuss with infectious disease -will likely need outpatient follow up  Hyponatremia -chronic -continue to monitor   DVT Prophylaxis  lovenox  Code Status: Full  Family Communication: None at bedside  Disposition Plan:  Status is: Inpatient  Remains inpatient appropriate because:Ongoing diagnostic testing needed not appropriate for outpatient work up and IV treatments appropriate due to intensity of illness or inability to take PO  Dispo: The patient is from: Home              Anticipated d/c is to: Home               Patient currently is not medically stable to d/c.   Difficult to place patient No   Consultants General surgery by [facility]  Procedures  EGD MRCP Renal US  Antibiotics   Anti-infectives (From admission, onward)    Start     Dose/Rate Route Frequency Ordered Stop   06/18/21 2200  fluconazole (DIFLUCAN) tablet 200 mg       See Hyperspace for full Linked Orders Report.   200 mg Oral Daily at bedtime 06/17/21 2348 07/01/21 2159   06/18/21 0600  piperacillin-tazobactam (ZOSYN) IVPB 3.375 g        3.375 g 12.5 mL/hr over 240 Minutes Intravenous Every 8 hours 06/18/21 0019     06/18/21 0030  piperacillin-tazobactam (ZOSYN) IVPB 3.375 g        3.375 g 100 mL/hr over 30 Minutes Intravenous  Once 06/18/21 0019 06/18/21 0309   06/18/21 0000  fluconazole (DIFLUCAN) tablet 400 mg       See Hyperspace for full Linked Orders Report.   400 mg Oral  Once 06/17/21 2348 06/18/21 0057       Subjective:   [name] seen and examined today.  Continues to have abdominal pain but states it is mildly better.  Denies current nausea or vomiting.  Denies chest pain at this time.  Feels his breathing has improved since last night but continues to feel somewhat short of breath and not back to baseline.  Denies dizziness or headache at this time.   Objective:   Vitals:   06/21/21 0500 06/21/21 0700 06/21/21 0800 06/21/21 1000  BP: 104/74 (!) 85/66 (!) 89/72 96/75  Pulse: 91 93    Resp: _0 Temp: (!) 97.4 F (36.3  C)   (!) 97.4 F (36.3 C)  TempSrc: Oral   Axillary  SpO2: 97% 100% 98%   Weight:      Height:        Intake/Output Summary (Last 24 hours) at 06/21/2021 1119 Last data filed at 06/21/2021 0809 Gross per 24 hour  Intake 1084.93 ml  Output 1625 ml  Net -540.07 ml    Filed Weights   06/17/21 1939 06/20/21 0500  Weight: 75 kg 79.5 kg   Exam General: Well developed, chronically ill-appearing, NAD HEENT: NCAT, mucous membranes moist.  Cardiovascular: S1 S2  auscultated, RRR Respiratory: Diminished breath sounds, no wheezing Abdomen: Soft, upper abdominal TTP, nondistended, + bowel sounds Extremities: warm dry without cyanosis clubbing. +LE edema Neuro: AAOx3, nonfocal Psych: normal affect and demeanor  Data Reviewed: I have personally reviewed following labs and imaging studies  CBC: Recent Labs  Lab 06/16/21 1122 06/17/21 1836 06/18/21 0543 06/19/21 0140 06/20/21 0426 06/21/21 0152  WBC 9.3 8.4 11.5* 12.4* 10.1 8.6  NEUTROABS 6.5 6.0 8.1*  --   --   --   HGB 13.2 12.7* 13.1 11.1* 12.0* 11.9*  HCT 40.5 38.7* 40.6 32.9* 36.1* 36.3*  MCV 81.2 79.8* 82.0 78.1* 78.6* 79.8*  PLT 323 336 339 308 314 546    Basic Metabolic Panel: Recent Labs  Lab 06/17/21 1836 06/18/21 0100 06/19/21 0140 06/20/21 0426 06/21/21 0152  NA 129* 131* 128* 127* 129*  K 5.6* 5.8* 6.1* 5.0 4.7  CL 94* 97* 92* 95* 96*  CO2 23 21* 23 22 21*  GLUCOSE 135* 157* 146* 103* 129*  BUN 69* 66* 83* 75* 67*  CREATININE 2.92* 2.70* 3.42* 2.82* 2.44*  CALCIUM 9.2 8.5* 8.8* 8.0* 8.3*  MG  --  2.3  --   --   --     GFR: Estimated Creatinine Clearance: 35.3 mL/min (A) (by C-G formula based on SCr of 2.44 mg/dL (H)). Liver Function Tests: Recent Labs  Lab 06/17/21 1836 06/18/21 0100 06/19/21 0140 06/20/21 0426 06/21/21 0152  AST 47* 46* 155* 118* 99*  ALT 40 38 122* 122* 111*  ALKPHOS 168* 139* 142* 145* 143*  BILITOT 2.0* 2.4* 3.4* 2.1* 1.9*  PROT 7.1 6.4* 6.2* 6.5 6.7  ALBUMIN 3.3* 3.0* 2.7* 2.9* 3.0*    Recent Labs  Lab 06/17/21 1836 06/19/21 0140  LIPASE 52* 45    No results for input(s): AMMONIA in the last 168 hours. Coagulation Profile: Recent Labs  Lab 06/17/21 1836 06/18/21 0543  INR 1.5* 1.7*    Cardiac Enzymes: No results for input(s): CKTOTAL, CKMB, CKMBINDEX, TROPONINI in the last 168 hours. BNP (last 3 results) No results for input(s): PROBNP in the last 8760 hours. HbA1C: No results for input(s): HGBA1C in the last 72  hours. CBG: No results for input(s): GLUCAP in the last 168 hours. Lipid Profile: No results for input(s): CHOL, HDL, LDLCALC, TRIG, CHOLHDL, LDLDIRECT in the last 72 hours. Thyroid Function Tests: No results for input(s): TSH, T4TOTAL, FREET4, T3FREE, THYROIDAB in the last 72 hours. Anemia Panel: No results for input(s): VITAMINB12, FOLATE, FERRITIN, TIBC, IRON, RETICCTPCT in the last 72 hours. Urine analysis:    Component Value Date/Time   COLORURINE YELLOW 06/17/2021 [place] 06/17/2021 2225   LABSPEC 1.011 06/17/2021 2225   PHURINE 5.0 06/17/2021 2225   GLUCOSEU NEGATIVE 06/17/2021 2225   HGBUR NEGATIVE 06/17/2021 [place] 06/17/2021 [name] 06/17/2021 2225   PROTEINUR 100 (A) 06/17/2021 2225  NITRITE NEGATIVE 06/17/2021 2225   LEUKOCYTESUR NEGATIVE 06/17/2021 2225   Sepsis Labs: _0 (procalcitonin:4,lacticidven:4)  ) Recent Results (from the past 240 hour(s))  Blood Culture (routine x 2)     Status: None (Preliminary result)   Collection Time: 06/17/21  6:36 PM   Specimen: BLOOD  Result Value Ref Range Status   Specimen Description BLOOD RIGHT ANTECUBITAL  Final   Special Requests   Final    BOTTLES DRAWN AEROBIC ONLY Blood Culture results may not be optimal due to an inadequate volume of blood received in culture bottles   Culture   Final    NO GROWTH 4 DAYS Performed at [facility], [place] [address], [place], [place] [address]    Report Status PENDING  Incomplete  Blood Culture (routine x 2)     Status: None (Preliminary result)   Collection Time: 06/17/21  7:30 PM   Specimen: BLOOD  Result Value Ref Range Status   Specimen Description BLOOD BLOOD RIGHT FOREARM  Final   Special Requests   Final    BOTTLES DRAWN AEROBIC AND ANAEROBIC Blood Culture results may not be optimal due to an inadequate volume of blood received in culture bottles   Culture   Final    NO GROWTH 4 DAYS Performed at [facility], [place] [address] [place], [place] [address]    Report Status PENDING  Incomplete  Resp Panel by RT-PCR (Flu A&B, Covid) Nasopharyngeal Swab     Status: None   Collection Time: 06/17/21  7:30 PM   Specimen: Nasopharyngeal Swab; Nasopharyngeal(NP) swabs in vial transport medium  Result Value Ref Range Status   SARS Coronavirus 2 by RT PCR NEGATIVE NEGATIVE Final    Comment: (NOTE) SARS-CoV-2 target nucleic acids are NOT DETECTED.  The SARS-CoV-2 RNA is generally detectable in upper respiratory specimens during the acute phase of infection. The lowest concentration of SARS-CoV-2 viral copies this assay can detect is 138 copies/mL. A negative result does not preclude SARS-Cov-2 infection and should not be used as the sole basis for treatment or other patient management decisions. A negative result may occur with  improper specimen collection/handling, submission of specimen other than nasopharyngeal swab, presence of viral mutation(s) within the areas targeted by this assay, and inadequate number of viral copies(<138 copies/mL). A negative result must be combined with clinical observations, patient history, and epidemiological information. The expected result is Negative.  Fact Sheet for Patients:  EntrepreneurPulse.com.au  Fact Sheet for Healthcare Providers:  IncredibleEmployment.be  This test is no t yet approved or cleared by the [place] FDA and  has been authorized for detection and/or diagnosis of SARS-CoV-2 by FDA under an Emergency Use Authorization (EUA). This EUA will remain  in effect (meaning this test can be used) for the duration of the COVID-19 declaration under Section 564(b)(1) of the Act, 21 U.S.C.section 360bbb-3(b)(1), unless the authorization is terminated  or revoked sooner.       Influenza A by PCR NEGATIVE NEGATIVE Final   Influenza B by PCR NEGATIVE NEGATIVE Final    Comment: (NOTE) The Xpert Xpress  SARS-CoV-2/FLU/RSV plus assay is intended as an aid in the diagnosis of influenza from Nasopharyngeal swab specimens and should not be used as a sole basis for treatment. Nasal washings and aspirates are unacceptable for Xpert Xpress SARS-CoV-2/FLU/RSV testing.  Fact Sheet for Patients: EntrepreneurPulse.com.au  Fact Sheet for Healthcare Providers: IncredibleEmployment.be  This test is not yet approved or cleared by the [place] FDA and has  been authorized for detection and/or diagnosis of SARS-CoV-2 by FDA under an Emergency Use Authorization (EUA). This EUA will remain in effect (meaning this test can be used) for the duration of the COVID-19 declaration under Section 564(b)(1) of the Act, 21 U.S.C. section 360bbb-3(b)(1), unless the authorization is terminated or revoked.  Performed at [facility], [place] [address], [place],  [address]       Radiology Studies: US RENAL  Result Date: 06/19/2021 CLINICAL DATA:  Acute kidney injury, history hypertension, CHF, smoking, gastric cancer EXAM: RENAL / URINARY TRACT ULTRASOUND COMPLETE COMPARISON:  CT abdomen and pelvis 06/17/2021 FINDINGS: Right Kidney: Renal measurements: 11.3 x 4.6 x 5.3 cm = volume: 144 mL. Normal cortical thickness. Increased cortical echogenicity. No mass, hydronephrosis, or shadowing calcification. Left Kidney: Renal measurements: 11.3 x 5.7 x 5.2 cm = volume: 175 mL. Less well visualized due to suboptimal acoustic window and bowel. Normal cortical thickness. Increased cortical echogenicity. No mass, hydronephrosis, or shadowing calcification. Bladder: Incompletely distended, which may account for slight prominence of the wall. No gross mass. Other: N/A IMPRESSION: No definite renal sonographic abnormalities identified. Electronically Signed   By: Lavonia Dana M.D.   On: 06/19/2021 16:51   MR ABDOMEN MRCP WO CONTRAST  Result Date: 06/20/2021 CLINICAL DATA:  Right  upper quadrant and epigastric pain for 7 months. History gastric cancer. Abnormal CT scan. EXAM: MRI ABDOMEN WITHOUT CONTRAST  (INCLUDING MRCP) TECHNIQUE: Multiplanar multisequence MR imaging of the abdomen was performed. Heavily T2-weighted images of the biliary and pancreatic ducts were obtained, and three-dimensional MRCP images were rendered by post processing. COMPARISON:  Multiple prior imaging studies. Nuclear medicine hepatic biliary scan 06/18/2021 and CT scan 06/17/2021 FINDINGS: This examination is quite limited due to respiratory motion. The patient could not his breath for the examination. Lower chest: Stable cardiac enlargement without pericardial effusion. No obvious pleural effusions or lung base lesions. Hepatobiliary: No obvious hepatic lesions or intrahepatic biliary dilatation. Stable diffuse gallbladder wall thickening likely due to low albumin. No gallstones. Grossly normal caliber and course of the common bile duct. Pancreas:  No mass, inflammation or ductal dilatation. Spleen:  Normal size.  No definite lesions. Adrenals/Urinary Tract: The adrenal glands and kidneys are grossly normal. Stomach/Bowel: No gross abnormality involving the stomach. Difficult to identify the patient has gastric cancer on this study. Duodenum and visualized small-bowel loops are grossly normal. Vascular/Lymphatic: The aorta is normal in caliber. No obvious mesenteric retroperitoneal adenopathy. Other:  No ascites but there is diffuse mesenteric edema. Musculoskeletal: No significant bony findings. IMPRESSION: 1. Very limited examination due to respiratory motion. 2. Stable diffuse gallbladder wall thickening likely due to hepatic dysfunction and low albumin. 3. Grossly normal caliber and course of the common bile duct. 4. No obvious mesenteric or retroperitoneal adenopathy. 5. Difficult to identify the patient's gastric cancer on this study. Electronically Signed   By: Marijo Sanes M.D.   On: 06/20/2021 09:26    DG CHEST PORT 1 VIEW  Result Date: 06/21/2021 CLINICAL DATA:  Dyspnea EXAM: PORTABLE CHEST 1 VIEW COMPARISON:  06/17/2021 FINDINGS: Pulmonary insufflation has slightly diminished in the interval since prior examination. There has developed mild bilateral perihilar interstitial pulmonary infiltrate most in keeping with mild cardiogenic failure. No pneumothorax or pleural effusion. Moderate cardiomegaly is stable. Left internal jugular chest port is again seen with its tip within the superior vena cava. No acute bone abnormality. IMPRESSION: Interval development of mild cardiogenic failure. Stable moderate cardiomegaly. Electronically Signed   By: Cassandria Anger  Christa See MD   On: 06/21/2021 01:53     Scheduled Meds:  aspirin EC  81 mg Oral Daily   atorvastatin  80 mg Oral Daily   enoxaparin (LOVENOX) injection  40 mg Subcutaneous Q24H   fluconazole  200 mg Oral QHS   furosemide  40 mg Intravenous Daily   gabapentin  100 mg Oral BID   pantoprazole  40 mg Oral BID AC   Continuous Infusions:  sodium chloride 250 mL (06/21/21 0648)   piperacillin-tazobactam (ZOSYN)  IV 3.375 g (06/21/21 0649)     LOS: 4 days   Time Spent in minutes   45 minutes  Atharv Barriere D.O. on 06/21/2021 at 11:19 AM  Between 7am to 7pm - Please see pager noted on [email or web address]  After 7pm go to [email or web address]  And look for the night coverage person covering for me after hours  [facility] Office  [phone]

## 2021-06-21 NOTE — Consult Note (Signed)
Consultation Note Date: 06/21/2021   Patient Name: Tyler Wong  DOB: 04/07/1966  MRN: [id number]  Age / Sex: 55 y.o., male  PCP: Tyler Perna, NP Referring Physician: Cristal Ford, DO  Reason for Consultation: Establishing goals of care  HPI/Patient Profile: 55 y.o. male  with past medical history of systolic heart failure (EF 20%), gastric cancer, CKD stage III/IV, cocaine abuse, tobacco abuse, CAD status post PCI, and COPD admitted on 06/17/2021 with abdominal pain and concern of cholecystitis. Found to have elevated lactic acid and concern for sepsis. Also with shortness of breath and hypoxia. Seen by cardiology - symptoms all related to low output CHF. Concern he is in cardiogenic shock d/t elevated lactic acid. He is not a candidate for LVAD or heart transplantation.  He is not a candidate for palliative inotropes. Per cardiology, he is not a candidate for short-term inotropes as he has had several admissions with short-term inotrope use and he continues to return to cocaine use which is the etiology of his cardiomyopathy. PMT consulted to discuss goals of care.  Of note, we have seen Tyler Wong many times in the past - he consistently requests full code/full scope care. He has asked [place] to leave and declines repeat visits/outpatient follow up.  Clinical Assessment and Goals of Care: I have reviewed medical records including EPIC notes, labs and imaging, received report from RN and Dr. Ree Wong, assessed the patient and then met with him  to discuss diagnosis prognosis, GOC, EOL wishes, disposition and options.  I introduced Palliative Medicine as specialized medical care for people living with serious illness. It focuses on providing relief from the symptoms and stress of a serious illness. The goal is to improve quality of life for both the patient and the family.  Tyler Wong remembers meetings with out team and appears agitated/annoyed. He remains  withdrawn throughout conversation.  I try to assess how he had been feeling since we saw him 1 month ago - he does not offer much information.   We discuss concern that all of his current symptoms are related to his failing heart. We discuss that this is not something we can fix. We discuss we are continuing medications to try to help him but our options are very limited and the medical team is concerned about a poor outcome. Tyler Wong nods to this information.  Strongly encouraged Tyler Wong to consider DNR/DNI status understanding evidenced based poor outcomes in similar hospitalized patients, as the cause of the arrest is likely associated with chronic/terminal disease rather than a reversible acute cardio-pulmonary event. We discuss that putting him through aggressive measures will not fix his failing heart. He tells me he understands my recommendation and does not agree. He again repeats adamantly that he does not agree.  We review in the past he has told us he would not want to be on a ventilator long-term and he confirms this remains true.   Tyler Wong declines to speak with me any further - I tell him I will have my partner come and check on him tomorrow and he tells me "No".  After my visit with Tyler Wong, I called his sister Tyler Wong. She is also his HCPOA. Tyler Wong is familiar with palliative team - we have spoken to her in the past.   We review the conversation I had with Tyler Wong. We review his condition and concern about prognosis. Tyler Wong would like to know "how long he has left" - we discuss that Tyler Wong is at risk for acute decompensation at any  time. Tyler Wong is tearful at this information but tells me she is not shocked - she was told during his last hospitalization that Tyler Wong would not survive to the end of this year. We discuss plan to continue current measures with medications. Tyler Wong plans to come to hospital to see Tyler Wong soon.  Primary Decision Maker PATIENT  If patient loses decision making ability, his sister  Tyler Wong is his Tyler Wong   - patient requests ongoing full code/full scope care - recommended against this - patient does confirm he would not want to be on the ventilator long term - patient's sister updated and aware of poor prognosis - coming to spend time with Tyler Wong does not want to speak to palliative team again - we will follow peripherally; if Tyler Wong loses decision making ability we can be available to support family  Code Status/Advance Care Planning: Full code  Prognosis:  Poor prognosis r/t likely cardiogenic shock  Discharge Planning: To Be Determined      Primary Diagnoses: Present on Admission:  Acute acalculous cholecystitis  Acute renal failure superimposed on stage 3b chronic kidney disease (HCC)  Coronary artery disease involving native coronary artery of native heart without angina pectoris  Cocaine abuse (HCC)  COPD (chronic obstructive pulmonary disease) (HCC)  Chronic combined systolic and diastolic congestive heart failure (HCC)  Esophageal candidiasis (HCC)  Hyperkalemia  Lactic acidosis   I have reviewed the medical record, interviewed the patient and family, and examined the patient. The following aspects are pertinent.  Past Medical History:  Diagnosis Date   CAD in native artery 07/17/2014   STEMI- LAD stenosis with DES   CHF (congestive heart failure) (HCC)    Cocaine abuse (HCC)    COPD (chronic obstructive pulmonary disease) (HCC)    Diabetes mellitus without complication ([place])    Dyspnea    Gastric cancer ([place]) 11/01/2020   GERD (gastroesophageal reflux disease)    Hypertension    ST elevation myocardial infarction (STEMI) involving left anterior descending (LAD) coronary artery with complication (HCC)    Tobacco abuse 07/17/2014   Social History   Socioeconomic History   Marital status: Single    Spouse name: Not on file   Number of children: 2   Years of education: 12   Highest education level: 12th  grade  Occupational History   Occupation: Landscaping  Tobacco Use   Smoking status: Every Day    Packs/day: 0.25    Years: 30.00    Pack years: 7.50    Types: Cigarettes   Smokeless tobacco: Never   Tobacco comments:    .5 PK PER DAY  Vaping Use   Vaping Use: Never used  Substance and Sexual Activity   Alcohol use: No    Comment: Patient denies abuse, states social drinker   Drug use: Yes    Types: Cocaine, "Crack" cocaine    Comment: heroin   Sexual activity: Yes  Other Topics Concern   Not on file  Social History Narrative   Lives in [place] with his sister and girlfriend. He works Aeronautical engineer work.    Social Determinants of Health   Financial Resource Strain: High Risk   Difficulty of Paying Living Expenses: Very hard  Food Insecurity: Food Insecurity Present   Worried About [place] in the Last Year: Sometimes true   Ran Out of Food in the Last Year: Sometimes true  Transportation Needs: No Transportation Needs   Lack of Transportation (Medical):  No   Lack of Transportation (Non-Medical): No  Physical Activity: Not on file  Stress: Not on file  Social Connections: Not on file   Family History  Problem Relation Age of Onset   Cirrhosis Father    Heart attack Mother    Heart attack Sister    Heart failure Sister    Heart attack Brother    Scheduled Meds:  aspirin EC  81 mg Oral Daily   atorvastatin  80 mg Oral Daily   enoxaparin (LOVENOX) injection  40 mg Subcutaneous Q24H   fluconazole  200 mg Oral QHS   gabapentin  100 mg Oral BID   pantoprazole  40 mg Oral BID AC   Continuous Infusions:  sodium chloride 250 mL (06/21/21 0648)   furosemide 160 mg (06/21/21 1542)   piperacillin-tazobactam (ZOSYN)  IV 3.375 g (06/21/21 1353)   PRN Meds:.sodium chloride, acetaminophen **OR** acetaminophen, albuterol, fentaNYL (SUBLIMAZE) injection **OR** fentaNYL (SUBLIMAZE) injection, ondansetron **OR** ondansetron (ZOFRAN) IV, oxyCODONE,  phenol, polyethylene glycol No Known Allergies Review of Systems  Constitutional:        Patient declines to participate in ROS   Physical Exam Constitutional:      General: He is not in acute distress. Pulmonary:     Effort: Pulmonary effort is normal.  Neurological:     Mental Status: He is alert and oriented to person, place, and time.    Vital Signs: BP 102/83 (BP Location: Right Arm)   Pulse 94   Temp 97.6 F (36.4 C) (Oral)   Resp 16   Ht 5' 10"  (1.778 m)   Wt 79.5 kg   SpO2 95%   BMI 25.14 kg/m  Pain Scale: 0-10   Pain Score: 0-No pain   SpO2: SpO2: 95 % O2 Device:SpO2: 95 % O2 Flow Rate: .O2 Flow Rate (L/min): 4 L/min  IO: Intake/output summary:  Intake/Output Summary (Last 24 hours) at 06/21/2021 1622 Last data filed at 06/21/2021 1400 Gross per 24 hour  Intake 500 ml  Output 2325 ml  Net -1825 ml    LBM: Last BM Date:  (Patient can't remember) Baseline Weight: Weight: 75 kg Most recent weight: Weight: 79.5 kg     Palliative Assessment/Data: PPS 50%    Time Total: 75 minutes Greater than 50%  of this time was spent counseling and coordinating care related to the above assessment and plan.  Juel Burrow, DNP, AGNP-C Palliative Medicine Team [phone] Pager: [phone]

## 2021-06-21 NOTE — Progress Notes (Signed)
Spoke with Cardiologist regarding pt poor prognosis and probable cardiogenic shock. Pt has refused to change code status with MD and Palliative Care team.    I notified Rapid Response RN of situation and we will continue to monitor and provide supportive care and continue plan of diuresis due to having no other available treatment options at this time.

## 2021-06-21 NOTE — Progress Notes (Signed)
Pharmacy Antibiotic Note  Tyler Wong is a 55 y.o. male admitted on 06/17/2021 with concerns for acute cholecystitis.  Pharmacy has been consulted for Zosyn dosing.  Day 4 Zosyn. HIDA scan shows no evidence of acute cholecystitis. Renal function improving. Patient is afebrile, WBC wnl. LA still elevated but trending down.   Plan: Continue Zosyn 3.375g IV q8h (4 hour infusion). F/u renal function, LOT  Height: 5\' 10"  (177.8 cm) Weight: 79.5 kg (175 lb 3.2 oz) IBW/kg (Calculated) : 73  Temp (24hrs), Avg:97.5 F (36.4 C), Min:97.4 F (36.3 C), Max:97.8 F (36.6 C)  Recent Labs  Lab 06/17/21 1836 06/17/21 2043 06/18/21 0100 06/18/21 0543 06/19/21 0140 06/20/21 0426 06/21/21 0152  WBC 8.4  --   --  11.5* 12.4* 10.1 8.6  CREATININE 2.92*  --  2.70*  --  3.42* 2.82* 2.44*  LATICACIDVEN 2.6* 2.3*  --  5.7* 2.6*  --  3.2*     Estimated Creatinine Clearance: 35.3 mL/min (A) (by C-G formula based on SCr of 2.44 mg/dL (H)).    No Known Allergies  Antimicrobials this admission: Zosyn 6/22>> Fluconazole x 14 days  Microbiology this admission: BCx 6/21: NG  Berenice Bouton, PharmD, MS, BCPS PGY2 Pharmacy Resident  Please check AMION for all [facility] Pharmacy phone numbers After 10:00 PM, call [place] [phone]  06/21/2021 12:08 PM

## 2021-06-21 NOTE — Consult Note (Signed)
Cardiology Consultation:  Patient ID: Tyler Wong MRN: [id number]; DOB: 07-10-1966  Admit date: 06/17/2021 Date of Consult: 06/21/2021  Primary Care Provider: [name], NP Primary Cardiologist: Shelva Majestic, MD  Primary Electrophysiologist:  None   Patient Profile:  Tyler Wong is a 55 y.o. male with a hx of systolic heart failure (EF 20%), gastric cancer, CKD stage III/IV, cocaine abuse, tobacco abuse, CAD status post PCI who is being seen today for the evaluation of systolic heart failure at the request of Cristal Ford, DO.  History of Present Illness:  Tyler Wong was admitted to the general medicine service on 06/17/2021.  He underwent routine imaging for abdominal pain by his oncologist.  CT of the abdomen and pelvis was concerning for acute cholecystitis.  He was admitted to the hospital elevated liver enzymes as well as elevated BNP.  Labs were notable for hyponatremia with a sodium of 129.  Labs on arrival were noted for hyponatremia with sodium 129.  Serum creatinine elevated 2.92.  He also had a persistent lactic acidosis that has not cleared.  He has been given intravenous fluids this admission.  HIDA scan shows he does not have cholecystitis.  He was evaluated by gastroenterology and underwent EGD.  He did have retained food in the esophagus but no residual gastric mass.  UDS was not checked.  He was cocaine positive in May 2022.  Due to persistently elevated liver enzymes have not improved with fluids and hyponatremia cardiology was consulted for possible acute heart failure.  BNP elevated 2176.  Troponins elevated 622 and 641 on repeat.  Lactic acid 3.2.  Telemetry shows sinus tachycardia. Reports he feels fatigued and weak.  He denies any shortness of breath.  He is grossly volume overloaded on examination with 2+ pitting edema.  He has JVD up to his earlobes.  Tachycardia noted with a 3 out of 6 holosystolic murmur.  Chest x-ray demonstrates interstitial edema.  Heart  Pathway Score:       Past Medical History: Past Medical History:  Diagnosis Date   CAD in native artery 07/17/2014   STEMI- LAD stenosis with DES   CHF (congestive heart failure) (HCC)    Cocaine abuse (HCC)    COPD (chronic obstructive pulmonary disease) ([place])    Diabetes mellitus without complication ([place])    Dyspnea    Gastric cancer ([place]) 11/01/2020   GERD (gastroesophageal reflux disease)    Hypertension    ST elevation myocardial infarction (STEMI) involving left anterior descending (LAD) coronary artery with complication ([place])    Tobacco abuse 07/17/2014    Past Surgical History: Past Surgical History:  Procedure Laterality Date   BIOPSY  11/05/2020   Procedure: BIOPSY;  Surgeon: Wilford Corner, MD;  Location: [facility];  Service: Endoscopy;;   CORONARY ANGIOPLASTY WITH STENT PLACEMENT  07/18/14   resolute DES to LAD STEMI   ESOPHAGOGASTRODUODENOSCOPY N/A 11/05/2020   Procedure: ESOPHAGOGASTRODUODENOSCOPY (EGD);  Surgeon: Wilford Corner, MD;  Location: [facility];  Service: Endoscopy;  Laterality: N/A;   IR IMAGING GUIDED PORT INSERTION  12/06/2020   LEFT HEART CATH N/A 07/17/2014   Procedure: LEFT HEART CATH;  Surgeon: [name], MD;  Location: [facility] CATH LAB;  Service: Cardiovascular;  Laterality: N/A;   PERCUTANEOUS CORONARY STENT INTERVENTION (PCI-S)  07/17/2014   Procedure: PERCUTANEOUS CORONARY STENT INTERVENTION (PCI-S);  Surgeon: [name], MD;  Location: [facility] CATH LAB;  Service: Cardiovascular;;   RIGHT/LEFT HEART CATH AND CORONARY ANGIOGRAPHY N/A 10/03/2018   Procedure: RIGHT/LEFT HEART  CATH AND CORONARY ANGIOGRAPHY;  Surgeon: [name], MD;  Location: [facility] CV LAB;  Service: Cardiovascular;  Laterality: N/A;     Inpatient Medications: Scheduled Meds:  aspirin EC  81 mg Oral Daily   atorvastatin  80 mg Oral Daily   enoxaparin (LOVENOX) injection  40 mg Subcutaneous Q24H   fluconazole  200 mg Oral QHS   gabapentin  100 mg Oral BID    pantoprazole  40 mg Oral BID AC   Continuous Infusions:  sodium chloride 250 mL (06/21/21 0648)   furosemide     piperacillin-tazobactam (ZOSYN)  IV 3.375 g (06/21/21 1353)   PRN Meds: sodium chloride, acetaminophen **OR** acetaminophen, albuterol, fentaNYL (SUBLIMAZE) injection **OR** fentaNYL (SUBLIMAZE) injection, ondansetron **OR** ondansetron (ZOFRAN) IV, oxyCODONE, phenol, polyethylene glycol  Allergies:    No Known Allergies  Social History:   Social History   Socioeconomic History   Marital status: Single    Spouse name: Not on file   Number of children: 2   Years of education: 12   Highest education level: 12th grade  Occupational History   Occupation: Landscaping  Tobacco Use   Smoking status: Every Day    Packs/day: 0.25    Years: 30.00    Pack years: 7.50    Types: Cigarettes   Smokeless tobacco: Never   Tobacco comments:    .5 PK PER DAY  Vaping Use   Vaping Use: Never used  Substance and Sexual Activity   Alcohol use: No    Comment: Patient denies abuse, states social drinker   Drug use: Yes    Types: Cocaine, "Crack" cocaine    Comment: heroin   Sexual activity: Yes  Other Topics Concern   Not on file  Social History Narrative   Lives in [place] with his sister and girlfriend. He works Aeronautical engineer work.    Social Determinants of Health   Financial Resource Strain: High Risk   Difficulty of Paying Living Expenses: Very hard  Food Insecurity: Food Insecurity Present   Worried About Charity fundraiser in the Last Year: Sometimes true   Ran Out of Food in the Last Year: Sometimes true  Transportation Needs: No Transportation Needs   Lack of Transportation (Medical): No   Lack of Transportation (Non-Medical): No  Physical Activity: Not on file  Stress: Not on file  Social Connections: Not on file  Intimate Partner Violence: Not At Risk   Fear of Current or Ex-Partner: No   Emotionally Abused: No   Physically Abused: No    Sexually Abused: No     Family History:    Family History  Problem Relation Age of Onset   Cirrhosis Father    Heart attack Mother    Heart attack Sister    Heart failure Sister    Heart attack Brother      ROS:  All other ROS reviewed and negative. Pertinent positives noted in the HPI.     Physical Exam/Data:   Vitals:   06/21/21 0700 06/21/21 0800 06/21/21 1000 06/21/21 1400  BP: (!) 85/66 (!) 89/72 96/75 102/83  Pulse: 93   94  Resp:  20 18 16   Temp:   (!) 97.4 F (36.3 C) 97.6 F (36.4 C)  TempSrc:   Axillary Oral  SpO2: 100% 98%  95%  Weight:      Height:        Intake/Output Summary (Last 24 hours) at 06/21/2021 1508 Last data filed at 06/21/2021 1400 Gross per  24 hour  Intake 1067.93 ml  Output 2325 ml  Net -1257.07 ml    Last 3 Weights 06/20/2021 06/17/2021 06/16/2021  Weight (lbs) 175 lb 3.2 oz 165 lb 5.5 oz 164 lb 9.6 oz  Weight (kg) 79.47 kg 75 kg 74.662 kg  Some encounter information is confidential and restricted. Go to Review Flowsheets activity to see all data.    Body mass index is 25.14 kg/m.   General: Ill-appearing Head: Atraumatic, normal size  Eyes: PEERLA, EOMI  Neck: Supple, JVD 17 to 18 cm of water, positive HJR Endocrine: No thryomegaly Cardiac: Normal S1, S2; tachycardia noted, 3 out of 6 holosystolic murmur Lungs: Rales at the lung bases Abd: Distended abdomen Ext: 2+ pitting edema up to knees Musculoskeletal: No deformities, BUE and BLE strength normal and equal Skin: Warm and dry, no rashes   Neuro: Alert and oriented to person, place, time, and situation, CNII-XII grossly intact, no focal deficits  Psych: Normal mood and affect   EKG:  The EKG was personally reviewed and demonstrates: Sinus tachycardia heart rate 102, diffuse Q waves, LVH by voltage Telemetry:  Telemetry was personally reviewed and demonstrates: Sinus tachycardia low 100s  Relevant CV Studies: TTE 10/04/2020   1. 3 D images of the LV were obtained. . Left  ventricular ejection  fraction, by estimation, is 20 to 25%. The left ventricle has severely  decreased function. The left ventricle demonstrates global hypokinesis.  The left ventricular internal cavity size  was moderately dilated. There is mild left ventricular hypertrophy. Left  ventricular diastolic parameters are consistent with Grade II diastolic  dysfunction (pseudonormalization).   2. Right ventricular systolic function is low normal. The right  ventricular size is normal. There is moderately elevated pulmonary artery  systolic pressure. The estimated right ventricular systolic pressure is  37.1 mmHg.   3. Left atrial size was moderately dilated.   4. Right atrial size was severely dilated.   5. The mitral valve is grossly normal. Moderate to severe mitral valve  regurgitation. Mild mitral stenosis.   6. Tricuspid valve regurgitation is moderate to severe.   7. The aortic valve is normal in structure. Aortic valve regurgitation is  not visualized. No aortic stenosis is present.   Laboratory Data: High Sensitivity Troponin:   Recent Labs  Lab 06/21/21 0152 06/21/21 0336 06/21/21 1132  TROPONINIHS 622* 641* 723*     Cardiac EnzymesNo results for input(s): TROPONINI in the last 168 hours. No results for input(s): TROPIPOC in the last 168 hours.  Chemistry Recent Labs  Lab 06/19/21 0140 06/20/21 0426 06/21/21 0152  NA 128* 127* 129*  K 6.1* 5.0 4.7  CL 92* 95* 96*  CO2 23 22 21*  GLUCOSE 146* 103* 129*  BUN 83* 75* 67*  CREATININE 3.42* 2.82* 2.44*  CALCIUM 8.8* 8.0* 8.3*  GFRNONAA 20* 26* 30*  ANIONGAP 13 10 12     Recent Labs  Lab 06/19/21 0140 06/20/21 0426 06/21/21 0152  PROT 6.2* 6.5 6.7  ALBUMIN 2.7* 2.9* 3.0*  AST 155* 118* 99*  ALT 122* 122* 111*  ALKPHOS 142* 145* 143*  BILITOT 3.4* 2.1* 1.9*   Hematology Recent Labs  Lab 06/19/21 0140 06/20/21 0426 06/21/21 0152  WBC 12.4* 10.1 8.6  RBC 4.21* 4.59 4.55  HGB 11.1* 12.0* 11.9*  HCT 32.9*  36.1* 36.3*  MCV 78.1* 78.6* 79.8*  MCH 26.4 26.1 26.2  MCHC 33.7 33.2 32.8  RDW 17.3* 17.9* 17.8*  PLT 308 314 297   BNP Recent Labs  Lab 06/21/21 0152  BNP 2,176.6*    DDimer No results for input(s): DDIMER in the last 168 hours.  Radiology/Studies:  CT ABDOMEN PELVIS WO CONTRAST  Result Date: 06/17/2021 CLINICAL DATA:  Sepsis Abdominal pain, acute, nonlocalized. Dx with "abdominal" cancer and had PET scan yesterday and oncologist called him today and said he has an inflamed gal bladder and that he should go to the ED EXAM: CT ABDOMEN AND PELVIS WITHOUT CONTRAST TECHNIQUE: Multidetector CT imaging of the abdomen and pelvis was performed following the standard protocol without IV contrast. COMPARISON:  Ultrasound abdomen 06/17/2021, CT abdomen pelvis 06/16/2021, CT abdomen pelvis 02/20/2021, PET CT 11/28/2020 FINDINGS: Lower chest: Cardiomegaly.  Persistent trace pericardial effusion. Hepatobiliary: No focal liver abnormality. Gallbladder wall thickening and haziness. No CT evidence of a calcified stone. No biliary dilatation. Pancreas: No focal lesion. Normal pancreatic contour. No surrounding inflammatory changes. No main pancreatic ductal dilatation. Spleen: Normal in size without focal abnormality. Adrenals/Urinary Tract: No adrenal nodule bilaterally. No nephrolithiasis, no hydronephrosis, and no contour-deforming renal mass. No ureterolithiasis or hydroureter. The urinary bladder is unremarkable. Stomach/Bowel: PO contrast reaches the rectum. Haziness as well as question of wall thickening of the distal duodenum with associated fat stranding and free fluid within the upper abdomen. Stomach is within normal limits. No evidence of large bowel wall thickening or dilatation. The appendix not definitely identified. Vascular/Lymphatic: No abdominal aorta or iliac aneurysm. Mild atherosclerotic plaque of the aorta and its branches. No abdominal, pelvic, or inguinal lymphadenopathy. Reproductive:  Prostate is unremarkable. Other: No intraperitoneal free fluid. No intraperitoneal free gas. No organized fluid collection. Musculoskeletal: No abdominal wall hernia or abnormality. No suspicious lytic or blastic osseous lesions. No acute displaced fracture. IMPRESSION: 1. Findings concerning for acalculous acute cholecystitis. Differential diagnosis of nonspecific stand alone gallbladder wall thickening includes chronic liver disease versus malignancy. A HIDA scan could be obtained for further evaluation of likely acalculous acute cholecystitis. 2. Haziness as well as question of wall thickening of the distal duodenum with associated fat stranding and free fluid within the upper abdomen. Finding may represent reactive changes versus duodenitis. 3. Given upper abdominal fat stranding in the vicinity of the pancreas, consider evaluation with lipase levels. 4. Trace simple free fluid ascites. 5. Markedly limited evaluation due to noncontrast study in a patient with known malignancy. Electronically Signed   By: Iven Finn M.D.   On: 06/17/2021 22:23   NM Hepatobiliary Liver Func  Result Date: 06/18/2021 CLINICAL DATA:  Elevated bilirubin: 2.4 (06/18/21) HPI: 55 year old male with past medical history of gastric cancer (Dx 10/2010), chronic kidney disease stage IIIb, EXAM: NUCLEAR MEDICINE HEPATOBILIARY IMAGING TECHNIQUE: Sequential images of the abdomen were obtained out to 60 minutes following intravenous administration of radiopharmaceutical. RADIOPHARMACEUTICALS:  7.6 mCi Tc-50m  Choletec IV COMPARISON:  CT 621 22 FINDINGS: Prompt uptake and biliary excretion of activity by the liver is seen. Gallbladder activity is visualized, consistent with patency of cystic duct. Biliary activity passes into small bowel, consistent with patent common bile duct. IMPRESSION: 1. Patent cystic duct and common bile duct. 2. No evidence acute cholecystitis. Electronically Signed   By: Suzy Bouchard M.D.   On: 06/18/2021  14:42   US RENAL  Result Date: 06/19/2021 CLINICAL DATA:  Acute kidney injury, history hypertension, CHF, smoking, gastric cancer EXAM: RENAL / URINARY TRACT ULTRASOUND COMPLETE COMPARISON:  CT abdomen and pelvis 06/17/2021 FINDINGS: Right Kidney: Renal measurements: 11.3 x 4.6 x 5.3 cm = volume: 144 mL. Normal cortical thickness. Increased cortical echogenicity.  No mass, hydronephrosis, or shadowing calcification. Left Kidney: Renal measurements: 11.3 x 5.7 x 5.2 cm = volume: 175 mL. Less well visualized due to suboptimal acoustic window and bowel. Normal cortical thickness. Increased cortical echogenicity. No mass, hydronephrosis, or shadowing calcification. Bladder: Incompletely distended, which may account for slight prominence of the wall. No gross mass. Other: N/A IMPRESSION: No definite renal sonographic abnormalities identified. Electronically Signed   By: Lavonia Dana M.D.   On: 06/19/2021 16:51   MR ABDOMEN MRCP WO CONTRAST  Result Date: 06/20/2021 CLINICAL DATA:  Right upper quadrant and epigastric pain for 7 months. History gastric cancer. Abnormal CT scan. EXAM: MRI ABDOMEN WITHOUT CONTRAST  (INCLUDING MRCP) TECHNIQUE: Multiplanar multisequence MR imaging of the abdomen was performed. Heavily T2-weighted images of the biliary and pancreatic ducts were obtained, and three-dimensional MRCP images were rendered by post processing. COMPARISON:  Multiple prior imaging studies. Nuclear medicine hepatic biliary scan 06/18/2021 and CT scan 06/17/2021 FINDINGS: This examination is quite limited due to respiratory motion. The patient could not his breath for the examination. Lower chest: Stable cardiac enlargement without pericardial effusion. No obvious pleural effusions or lung base lesions. Hepatobiliary: No obvious hepatic lesions or intrahepatic biliary dilatation. Stable diffuse gallbladder wall thickening likely due to low albumin. No gallstones. Grossly normal caliber and course of the common bile  duct. Pancreas:  No mass, inflammation or ductal dilatation. Spleen:  Normal size.  No definite lesions. Adrenals/Urinary Tract: The adrenal glands and kidneys are grossly normal. Stomach/Bowel: No gross abnormality involving the stomach. Difficult to identify the patient has gastric cancer on this study. Duodenum and visualized small-bowel loops are grossly normal. Vascular/Lymphatic: The aorta is normal in caliber. No obvious mesenteric retroperitoneal adenopathy. Other:  No ascites but there is diffuse mesenteric edema. Musculoskeletal: No significant bony findings. IMPRESSION: 1. Very limited examination due to respiratory motion. 2. Stable diffuse gallbladder wall thickening likely due to hepatic dysfunction and low albumin. 3. Grossly normal caliber and course of the common bile duct. 4. No obvious mesenteric or retroperitoneal adenopathy. 5. Difficult to identify the patient's gastric cancer on this study. Electronically Signed   By: Marijo Sanes M.D.   On: 06/20/2021 09:26   DG CHEST PORT 1 VIEW  Result Date: 06/21/2021 CLINICAL DATA:  Dyspnea EXAM: PORTABLE CHEST 1 VIEW COMPARISON:  06/17/2021 FINDINGS: Pulmonary insufflation has slightly diminished in the interval since prior examination. There has developed mild bilateral perihilar interstitial pulmonary infiltrate most in keeping with mild cardiogenic failure. No pneumothorax or pleural effusion. Moderate cardiomegaly is stable. Left internal jugular chest port is again seen with its tip within the superior vena cava. No acute bone abnormality. IMPRESSION: Interval development of mild cardiogenic failure. Stable moderate cardiomegaly. Electronically Signed   By: Fidela Salisbury MD   On: 06/21/2021 01:53   DG Chest Port 1 View  Result Date: 06/17/2021 CLINICAL DATA:  Nausea EXAM: PORTABLE CHEST 1 VIEW COMPARISON:  05/14/2021 FINDINGS: Cardiac shadow is enlarged but stable. Left chest wall port is again seen. Lungs are well aerated bilaterally. No  focal infiltrate or sizable effusion is seen. No acute bony abnormality is noted. IMPRESSION: Stable cardiomegaly.  No focal infiltrate is seen. Electronically Signed   By: Inez Catalina M.D.   On: 06/17/2021 20:27   [place] EKG SITE RITE  Result Date: 06/21/2021 If Site Rite image not attached, placement could not be confirmed due to current cardiac rhythm.  US Abdomen Limited RUQ (LIVER/GB)  Result Date: 06/17/2021 CLINICAL DATA:  Right upper  quadrant pain EXAM: ULTRASOUND ABDOMEN LIMITED RIGHT UPPER QUADRANT COMPARISON:  CT 06/16/2021 FINDINGS: Gallbladder: No shadowing stones. Increased gallbladder wall thickness up to 6 mm. Negative sonographic Murphy. Common bile duct: Diameter: 3.1 mm Liver: Echogenicity within normal limits. Shadowing calcification within the right hepatic lobe. Portal vein is patent on color Doppler imaging with normal direction of blood flow towards the liver. Other: None. IMPRESSION: 1. Gallbladder wall thickening without shadowing stone or sonographic Murphy. Findings are nonspecific. If acute gallbladder disease remains a concern, further evaluation with hepatobiliary nuclear medicine imaging could be obtained Electronically Signed   By: Donavan Foil M.D.   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  This is all just low output congestive heart failure.  Given his elevated lactic acid he is likely in cardiogenic shock. -I suspect this is the etiology of his elevated liver enzymes. -The etiology of his cardiomyopathy is continued cocaine abuse.  He is nearly cocaine positive every admission as far back as 2015.  He continues to use cocaine. -He has been evaluated by advanced heart failure.  He is not a  candidate for LVAD or heart transplantation.  He is not a candidate for palliative inotropes. -In my opinion he is not a candidate for short-term inotropes as he has had several admissions with short-term inotrope use and he continues to return to cocaine use which is the etiology of his cardiomyopathy. -All I can offer him is palliative diuresis.  Would recommend against any beta-blockers given decompensation.  He has not tolerated guideline directed medical therapy.  His CKD precludes ACE/ARB/Arni/MRA. -He currently has a narrow pulse pressure.  Extremities are cool.  Under any other circumstance in which options will be offered I would recommend PICC line with Co. oximetry as well as likely inotropes.  However, given his continued cocaine use this is all futile.  I did discuss this case briefly with the advanced heart failure team.  They are in agreement he has no options. -For now we will start with aggressive IV diuresis.  I will give him 160 mg of IV twice daily.  He will not tolerate any other therapy. -Would recommend palliative care consult.  He is not appropriate for full code.  I did discuss this with him briefly.  He reports that he wishes to remain full code and leave it up to God. -We will repeat an echocardiogram to further assess his hemodynamics.  Again I suspect his entire admission is related to low output heart failure and likely now cardiogenic shock.  For questions or updates, please contact [place] Please consult [email or web address] for contact info under   Signed, [place] T. Audie Box, MD, [place]  06/21/2021 3:08 PM

## 2021-06-22 LAB — COMPREHENSIVE METABOLIC PANEL
ALT: 83 U/L — ABNORMAL HIGH (ref 0–44)
AST: 68 U/L — ABNORMAL HIGH (ref 15–41)
Albumin: 2.5 g/dL — ABNORMAL LOW (ref 3.5–5.0)
Alkaline Phosphatase: 128 U/L — ABNORMAL HIGH (ref 38–126)
Anion gap: 11 (ref 5–15)
BUN: 43 mg/dL — ABNORMAL HIGH (ref 6–20)
CO2: 23 mmol/L (ref 22–32)
Calcium: 8.8 mg/dL — ABNORMAL LOW (ref 8.9–10.3)
Chloride: 101 mmol/L (ref 98–111)
Creatinine, Ser: 1.87 mg/dL — ABNORMAL HIGH (ref 0.61–1.24)
GFR, Estimated: 42 mL/min — ABNORMAL LOW (ref >60)
Glucose, Bld: 142 mg/dL — ABNORMAL HIGH (ref 70–99)
Potassium: 4.2 mmol/L (ref 3.5–5.1)
Sodium: 135 mmol/L (ref 135–145)
Total Bilirubin: 1.8 mg/dL — ABNORMAL HIGH (ref 0.3–1.2)
Total Protein: 6 g/dL — ABNORMAL LOW (ref 6.5–8.1)

## 2021-06-22 LAB — CULTURE, BLOOD (ROUTINE X 2)
Culture: NO GROWTH
Culture: NO GROWTH

## 2021-06-22 MED ORDER — METOCLOPRAMIDE HCL 5 MG/ML IJ SOLN: 5.0000 mg | Freq: Three times a day (TID) | INTRAMUSCULAR | Status: DC | PRN

## 2021-06-22 NOTE — Progress Notes (Signed)
PROGRESS NOTE    Tyler Wong  JEH:[id number] DOB: 03/14/1966 DOA: 06/17/2021 PCP: Tyler Perna, NP   Brief Narrative:  HPI On 06/17/2021 by Dr. 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  Patient explains that for the past several weeks he has been experiencing increasing abdominal pain.  Patient describes his abdominal pain is epigastric in location, radiating around the abdomen, severe in intensity, sharp in quality, worse with ingestion of foods and associated with decreased appetite and generalized weakness.  Patient complains of nausea but denies vomiting.  Patient denies fevers.  Patient denies dysuria or diarrhea.  Patient denies recent travel sick contacts or contacts with confirmed COVID-19 infection.   Patient was seen in oncology clinic on 6/20 in preparation for upcoming initiation of radiation therapy on 6/27 during this evaluation patient's symptoms prompted Dr.Sherrill to obtain CT imaging of the abdomen which revealed possible developing acute cholecystitis and therefore the patient was instructed on 6/21 to go to the emergency department for evaluation.     Upon evaluation in the emergency department patient was found to exhibit multiple SIRS criteria including tachycardia and tachypnea in setting of lactic acidosis of 2.6.  ER provider ordered 2 L of lactated Ringer solution bolus for the patient.  Patient was administered intravenous fentanyl for pain.  ER provider discussed case with Dr. Barry Wong with general surgery who recommended obtaining a HIDA scan, admission to  medicine and formal consultation of surgery if the HIDA scan is abnormal.  The hospitalist group was then called to assess the patient for admission to the hospital.  Interim history Patient presented with abdominal pain found to have acute acalculous cholecystitis.  HIDA scan showed no evidence of acute cholecystitis.  LFTs, alk phos and bilirubin trending upward.  Will discuss with gastroenterology. Assessment & Plan   SIRS -Initially patient thought to have severe sepsis secondary to an acute calculus cholecystitis -Patient had presented with tachycardia, tachypnea, lactic acid of 5.7, with worsening abdominal pain -CT abdomen pelvis findings concerning for acute  acalculous cholecystitis -At this time no source of infection has been found -He has completed 5 doses of IV Zosyn, which has been discontinued today -Work-up included HIDA scan which was unremarkable for acute cholecystitis.  MRCP showed stable diffuse gallbladder wall thickening,normal caliber and course of common bile duct.  No obvious mesenteric or retroperitoneal adenopathy. -Gastroenterology consulted and appreciated -EGD showed large amount of retained food.  Recommendations were to switch to liquid diet, initiate metoclopramide 5 mg IV every 6 hours, if tolerated increase to 10 mg IV every 6 hours.  Await results of duodenal and gastric biopsies.  May need repeat endoscopy in several days. -Discussed with Dr. Michail Wong, will advance diet to soft and to continue Reglan 5 mg every 8 hours as needed for nausea/vomiting  Acute hypoxic respiratory failure -Likely secondary to CHF exacerbation -Oxygen saturations dropped 87%- placed on supplemental oxgyen -Resolved, patient weaned to room air maintaining oxygen saturations at 100  Acute on Chronic combined systolic and diastolic heart failure -Patient developed shortness of breath overnight -Was on IVF for AKI and elevated LFTs -Echocardiogram 10/04/2020 shows an EF of 20-25%,  grade 2 diastolic  dysfunction -BNP obtained 2176.6 -CXR showed mild cardiogenic failure -Monitor intake and output, daily weights -Cardiology consulted and appreciated, patient placed on high-dose IV Lasix 160 mg twice daily -Patient with end-stage cardiomyopathy and not a candidate for advanced therapies.  Cannot use beta-blocker, ACE inhibitor or ARB, given CKD which also precludes digoxin therapy  Elevated troponin -Likely secondary to CHF exac/demand ischemia -High-sensitivity troponin peaked at 723, although was trending down -no complaints of chest pain at this time  Acute kidney injury on chronic kidney disease, stage IIIb -Baseline creatinine approximate 1.8-2.1 -On admission, creatinine 2.92- peaked to 3.42 -Creatinine down to 1.87 today -IVF discontinued -Renal US was unremarkable -Continue monitor BMP  Lactic acidosis -Suspect secondary to SIRS vs CHF decompensation -Lactic acid 5.7 on admission- down to 3.2 -Was placed on broad-spectrum antibiotics and IVF  Coronary artery disease -Patient currently chest pain-free -Continue aspirin, statin  Hyperkalemia -Suspect secondary to renal function -No EKG changes -Resolved with Lokelma -Continue to monitor BMP  Cocaine abuse -Patient counseled on cessation -UDS positive for benzodiazepines and cocaine  COPD -No evidence of exacerbation at this time -Continue bronchodilator therapy as needed  Esophageal candidiasis -Patient with noted thrush on exam -Also complaining of odynophagia -Continue fluconazole for 14 days total -If symptoms do not improve, will consider GI consultation  Hepatitis B/C -Patient had hepatitis panel done in April 2021 and positive Hep B Core Ab in April 2018 -Hepatitis C antibody was reactive, with a quantitative HCV of 28, quantitative log of 1.301 -Will discuss with infectious disease -will likely need outpatient follow up  Hyponatremia -chronic-but stable today -continue to  monitor   Goals of care -Given end-stage CHF, palliative care consulted and appreciated -Several discussions have been had in the past as well as this admission, patient would like to continue being full code  DVT Prophylaxis  lovenox  Code Status: Full  Family Communication: None at bedside  Disposition Plan:  Status is: Inpatient  Remains inpatient appropriate because:Ongoing diagnostic testing needed not appropriate for outpatient work up and IV treatments appropriate due to intensity of illness or inability to take PO  Dispo: The patient is from: Home              Anticipated d/c is to: Home              Patient currently is not medically stable to d/c.   Difficult to place patient No   Consultants General surgery by [facility] Cardiology Palliative care  Procedures  EGD MRCP Renal US  Antibiotics   Anti-infectives (From admission, onward)    Start     Dose/Rate Route Frequency Ordered Stop   06/18/21 2200  fluconazole (DIFLUCAN) tablet 200 mg       See Hyperspace for full Linked Orders Report.   200 mg Oral Daily at bedtime 06/17/21 2348 07/01/21 2159   06/18/21 0600  piperacillin-tazobactam (ZOSYN) IVPB 3.375 g        3.375 g 12.5 mL/hr over 240 Minutes Intravenous Every 8 hours 06/18/21 0019 06/23/21 0559   06/18/21 0030  piperacillin-tazobactam (ZOSYN) IVPB 3.375 g        3.375 g 100 mL/hr over 30 Minutes Intravenous  Once 06/18/21 0019 06/18/21 0309   06/18/21 0000  fluconazole (DIFLUCAN) tablet 400 mg       See Hyperspace for full Linked Orders Report.   400 mg Oral  Once 06/17/21 2348 06/18/21 0057       Subjective:   Tyler Wong seen and  examined today.  Continues to have some abdominal pain but would like to eat something.  Denies current nausea or vomiting.  Denies chest pain.  Feels his breathing has improved.  Denies current dizziness, or headache.    Objective:   Vitals:   06/21/21 1400 06/21/21 2140 06/22/21 0207 06/22/21 0548   BP: 102/83 112/84 (!) 81/65 114/87  Pulse: 94 94 100 99  Resp: _0 Temp: 97.6 F (36.4 C) 97.7 F (36.5 C) 97.8 F (36.6 C) 97.7 F (36.5 C)  TempSrc: Oral Oral Oral Oral  SpO2: 95% 100% 100% 100%  Weight:    77.4 kg  Height:        Intake/Output Summary (Last 24 hours) at 06/22/2021 1321 Last data filed at 06/22/2021 0544 Gross per 24 hour  Intake 800 ml  Output 1300 ml  Net -500 ml    Filed Weights   06/17/21 1939 06/20/21 0500 06/22/21 0548  Weight: 75 kg 79.5 kg 77.4 kg   Exam General: Well developed, chronically ill-appearing, NAD HEENT: NCAT, mucous membranes moist.  Cardiovascular: S1 S2 auscultated, RRR-tachycardic Respiratory: Diminished breath sounds, crackles Abdomen: Soft, upper abdominal TTP, nondistended, + bowel sounds Extremities: warm dry without cyanosis clubbing. ++LE edema Neuro: AAOx3, nonfocal Psych: normal affect and demeanor  Data Reviewed: I have personally reviewed following labs and imaging studies  CBC: Recent Labs  Lab 06/16/21 1122 06/17/21 1836 06/18/21 0543 06/19/21 0140 06/20/21 0426 06/21/21 0152  WBC 9.3 8.4 11.5* 12.4* 10.1 8.6  NEUTROABS 6.5 6.0 8.1*  --   --   --   HGB 13.2 12.7* 13.1 11.1* 12.0* 11.9*  HCT 40.5 38.7* 40.6 32.9* 36.1* 36.3*  MCV 81.2 79.8* 82.0 78.1* 78.6* 79.8*  PLT 323 336 339 308 314 935    Basic Metabolic Panel: Recent Labs  Lab 06/18/21 0100 06/19/21 0140 06/20/21 0426 06/21/21 0152 06/22/21 0733  NA 131* 128* 127* 129* 135  K 5.8* 6.1* 5.0 4.7 4.2  CL 97* 92* 95* 96* 101  CO2 21* 23 22 21* 23  GLUCOSE 157* 146* 103* 129* 142*  BUN 66* 83* 75* 67* 43*  CREATININE 2.70* 3.42* 2.82* 2.44* 1.87*  CALCIUM 8.5* 8.8* 8.0* 8.3* 8.8*  MG 2.3  --   --   --   --     GFR: Estimated Creatinine Clearance: 46.1 mL/min (A) (by C-G formula based on SCr of 1.87 mg/dL (H)). Liver Function Tests: Recent Labs  Lab 06/18/21 0100 06/19/21 0140 06/20/21 0426 06/21/21 0152 06/22/21 0733   AST 46* 155* 118* 99* 68*  ALT 38 122* 122* 111* 83*  ALKPHOS 139* 142* 145* 143* 128*  BILITOT 2.4* 3.4* 2.1* 1.9* 1.8*  PROT 6.4* 6.2* 6.5 6.7 6.0*  ALBUMIN 3.0* 2.7* 2.9* 3.0* 2.5*    Recent Labs  Lab 06/17/21 1836 06/19/21 0140  LIPASE 52* 45    No results for input(s): AMMONIA in the last 168 hours. Coagulation Profile: Recent Labs  Lab 06/17/21 1836 06/18/21 0543  INR 1.5* 1.7*    Cardiac Enzymes: No results for input(s): CKTOTAL, CKMB, CKMBINDEX, TROPONINI in the last 168 hours. BNP (last 3 results) No results for input(s): PROBNP in the last 8760 hours. HbA1C: No results for input(s): HGBA1C in the last 72 hours. CBG: No results for input(s): GLUCAP in the last 168 hours. Lipid Profile: No results for input(s): CHOL, HDL, LDLCALC, TRIG, CHOLHDL, LDLDIRECT in the last 72 hours. Thyroid Function Tests: No results for input(s): TSH, T4TOTAL, FREET4, T3FREE,  THYROIDAB in the last 72 hours. Anemia Panel: No results for input(s): VITAMINB12, FOLATE, FERRITIN, TIBC, IRON, RETICCTPCT in the last 72 hours. Urine analysis:    Component Value Date/Time   COLORURINE YELLOW 06/17/2021 2225   APPEARANCEUR CLEAR 06/17/2021 2225   LABSPEC 1.011 06/17/2021 2225   PHURINE 5.0 06/17/2021 2225   GLUCOSEU NEGATIVE 06/17/2021 2225   HGBUR NEGATIVE 06/17/2021 2225   BILIRUBINUR NEGATIVE 06/17/2021 2225   [place] 06/17/2021 2225   PROTEINUR 100 (A) 06/17/2021 2225   NITRITE NEGATIVE 06/17/2021 2225   LEUKOCYTESUR NEGATIVE 06/17/2021 2225   Sepsis Labs: _0 (procalcitonin:4,lacticidven:4)  ) Recent Results (from the past 240 hour(s))  Blood Culture (routine x 2)     Status: None   Collection Time: 06/17/21  6:36 PM   Specimen: BLOOD  Result Value Ref Range Status   Specimen Description BLOOD RIGHT ANTECUBITAL  Final   Special Requests   Final    BOTTLES DRAWN AEROBIC ONLY Blood Culture results may not be optimal due to an inadequate volume of blood  received in culture bottles   Culture   Final    NO GROWTH 5 DAYS Performed at [facility], [place] [address] [place], [place] [address]    Report Status 06/22/2021 FINAL  Final  Blood Culture (routine x 2)     Status: None   Collection Time: 06/17/21  7:30 PM   Specimen: BLOOD  Result Value Ref Range Status   Specimen Description BLOOD BLOOD RIGHT FOREARM  Final   Special Requests   Final    BOTTLES DRAWN AEROBIC AND ANAEROBIC Blood Culture results may not be optimal due to an inadequate volume of blood received in culture bottles   Culture   Final    NO GROWTH 5 DAYS Performed at [facility], [place] [address] [place], [place] [address]    Report Status 06/22/2021 FINAL  Final  Resp Panel by RT-PCR (Flu A&B, Covid) Nasopharyngeal Swab     Status: None   Collection Time: 06/17/21  7:30 PM   Specimen: Nasopharyngeal Swab; Nasopharyngeal(NP) swabs in vial transport medium  Result Value Ref Range Status   SARS Coronavirus 2 by RT PCR NEGATIVE NEGATIVE Final    Comment: (NOTE) SARS-CoV-2 target nucleic acids are NOT DETECTED.  The SARS-CoV-2 RNA is generally detectable in upper respiratory specimens during the acute phase of infection. The lowest concentration of SARS-CoV-2 viral copies this assay can detect is 138 copies/mL. A negative result does not preclude SARS-Cov-2 infection and should not be used as the sole basis for treatment or other patient management decisions. A negative result may occur with  improper specimen collection/handling, submission of specimen other than nasopharyngeal swab, presence of viral mutation(s) within the areas targeted by this assay, and inadequate number of viral copies(<138 copies/mL). A negative result must be combined with clinical observations, patient history, and epidemiological information. The expected result is Negative.  Fact Sheet for Patients:  EntrepreneurPulse.com.au  Fact Sheet for Healthcare  Providers:  IncredibleEmployment.be  This test is no t yet approved or cleared by the [place] FDA and  has been authorized for detection and/or diagnosis of SARS-CoV-2 by FDA under an Emergency Use Authorization (EUA). This EUA will remain  in effect (meaning this test can be used) for the duration of the COVID-19 declaration under Section 564(b)(1) of the Act, 21 U.S.C.section 360bbb-3(b)(1), unless the authorization is terminated  or revoked sooner.       Influenza A by PCR NEGATIVE NEGATIVE  Final   Influenza B by PCR NEGATIVE NEGATIVE Final    Comment: (NOTE) The Xpert Xpress SARS-CoV-2/FLU/RSV plus assay is intended as an aid in the diagnosis of influenza from Nasopharyngeal swab specimens and should not be used as a sole basis for treatment. Nasal washings and aspirates are unacceptable for Xpert Xpress SARS-CoV-2/FLU/RSV testing.  Fact Sheet for Patients: EntrepreneurPulse.com.au  Fact Sheet for Healthcare Providers: IncredibleEmployment.be  This test is not yet approved or cleared by the [place] FDA and has been authorized for detection and/or diagnosis of SARS-CoV-2 by FDA under an Emergency Use Authorization (EUA). This EUA will remain in effect (meaning this test can be used) for the duration of the COVID-19 declaration under Section 564(b)(1) of the Act, 21 U.S.C. section 360bbb-3(b)(1), unless the authorization is terminated or revoked.  Performed at [facility], [place] [address], [place], [place] [address]       Radiology Studies: DG CHEST PORT 1 VIEW  Result Date: 06/21/2021 CLINICAL DATA:  Dyspnea EXAM: PORTABLE CHEST 1 VIEW COMPARISON:  06/17/2021 FINDINGS: Pulmonary insufflation has slightly diminished in the interval since prior examination. There has developed mild bilateral perihilar interstitial pulmonary infiltrate most in keeping with mild cardiogenic failure. No pneumothorax or  pleural effusion. Moderate cardiomegaly is stable. Left internal jugular chest port is again seen with its tip within the superior vena cava. No acute bone abnormality. IMPRESSION: Interval development of mild cardiogenic failure. Stable moderate cardiomegaly. Electronically Signed   By: Fidela Salisbury MD   On: 06/21/2021 01:53   [place] EKG SITE RITE  Result Date: 06/21/2021 If Site Rite image not attached, placement could not be confirmed due to current cardiac rhythm.    Scheduled Meds:  aspirin EC  81 mg Oral Daily   atorvastatin  80 mg Oral Daily   enoxaparin (LOVENOX) injection  40 mg Subcutaneous Q24H   fluconazole  200 mg Oral QHS   gabapentin  100 mg Oral BID   pantoprazole  40 mg Oral BID AC   Continuous Infusions:  sodium chloride 250 mL (06/21/21 0648)   furosemide 160 mg (06/22/21 0828)   piperacillin-tazobactam (ZOSYN)  IV 3.375 g (06/22/21 1310)     LOS: 5 days   Time Spent in minutes   45 minutes  Luisantonio Adinolfi D.O. on 06/22/2021 at 1:21 PM  Between 7am to 7pm - Please see pager noted on [email or web address]  After 7pm go to [email or web address]  And look for the night coverage person covering for me after hours  [facility] Office  [phone]

## 2021-06-22 NOTE — Progress Notes (Signed)
Cardiology Progress Note  Patient ID: Tyler Wong MRN: [id number] DOB: 1966-04-28 Date of Encounter: 06/22/2021  Primary Cardiologist: Shelva Majestic, MD  Subjective   Chief Complaint: Shortness of breath  HPI: Good diuresis overnight.  Still volume overloaded.  BP is a bit better.  Cocaine positive once again.  ROS:  All other ROS reviewed and negative. Pertinent positives noted in the HPI.     Inpatient Medications  Scheduled Meds:  aspirin EC  81 mg Oral Daily   atorvastatin  80 mg Oral Daily   enoxaparin (LOVENOX) injection  40 mg Subcutaneous Q24H   fluconazole  200 mg Oral QHS   gabapentin  100 mg Oral BID   pantoprazole  40 mg Oral BID AC   Continuous Infusions:  sodium chloride 250 mL (06/21/21 0648)   furosemide 160 mg (06/21/21 1542)   piperacillin-tazobactam (ZOSYN)  IV 3.375 g (06/22/21 0544)   PRN Meds: sodium chloride, acetaminophen **OR** acetaminophen, albuterol, fentaNYL (SUBLIMAZE) injection **OR** fentaNYL (SUBLIMAZE) injection, ondansetron **OR** ondansetron (ZOFRAN) IV, oxyCODONE, phenol, polyethylene glycol   Vital Signs   Vitals:   06/21/21 1400 06/21/21 2140 06/22/21 0207 06/22/21 0548  BP: 102/83 112/84 (!) 81/65 114/87  Pulse: 94 94 100 99  Resp: 16 18 16 18   Temp: 97.6 F (36.4 C) 97.7 F (36.5 C) 97.8 F (36.6 C) 97.7 F (36.5 C)  TempSrc: Oral Oral Oral Oral  SpO2: 95% 100% 100% 100%  Weight:    77.4 kg  Height:        Intake/Output Summary (Last 24 hours) at 06/22/2021 0812 Last data filed at 06/22/2021 0544 Gross per 24 hour  Intake 800 ml  Output 1600 ml  Net -800 ml   Last 3 Weights 06/22/2021 06/20/2021 06/17/2021  Weight (lbs) 170 lb 9.6 oz 175 lb 3.2 oz 165 lb 5.5 oz  Weight (kg) 77.384 kg 79.47 kg 75 kg  Some encounter information is confidential and restricted. Go to Review Flowsheets activity to see all data.      Telemetry  Overnight telemetry shows sinus tachycardia in the low 100s, which I personally reviewed.    Physical Exam   Vitals:   06/21/21 1400 06/21/21 2140 06/22/21 0207 06/22/21 0548  BP: 102/83 112/84 (!) 81/65 114/87  Pulse: 94 94 100 99  Resp: 16 18 16 18   Temp: 97.6 F (36.4 C) 97.7 F (36.5 C) 97.8 F (36.6 C) 97.7 F (36.5 C)  TempSrc: Oral Oral Oral Oral  SpO2: 95% 100% 100% 100%  Weight:    77.4 kg  Height:        Intake/Output Summary (Last 24 hours) at 06/22/2021 0812 Last data filed at 06/22/2021 0544 Gross per 24 hour  Intake 800 ml  Output 1600 ml  Net -800 ml    Last 3 Weights 06/22/2021 06/20/2021 06/17/2021  Weight (lbs) 170 lb 9.6 oz 175 lb 3.2 oz 165 lb 5.5 oz  Weight (kg) 77.384 kg 79.47 kg 75 kg  Some encounter information is confidential and restricted. Go to Review Flowsheets activity to see all data.    Body mass index is 24.48 kg/m.   General: Ill-appearing Head: Atraumatic, normal size  Eyes: PEERLA, EOMI  Neck: Supple, JVD 17 to 18 cm of water Endocrine: No thryomegaly Cardiac: Normal S1, S2; tachycardia noted Lungs: Crackles at the lung bases Abd: Soft, nontender, no hepatomegaly  Ext: 2+ pitting edema up to the knees Musculoskeletal: No deformities, BUE and BLE strength normal and equal Skin: Warm and dry, no rashes  Neuro: Alert and oriented to person, place, time, and situation, CNII-XII grossly intact, no focal deficits  Psych: Normal mood and affect   Labs  High Sensitivity Troponin:   Recent Labs  Lab 06/21/21 0152 06/21/21 0336 06/21/21 1132 06/21/21 1446  TROPONINIHS 622* 641* 723* 608*     Cardiac EnzymesNo results for input(s): TROPONINI in the last 168 hours. No results for input(s): TROPIPOC in the last 168 hours.  Chemistry Recent Labs  Lab 06/19/21 0140 06/20/21 0426 06/21/21 0152  NA 128* 127* 129*  K 6.1* 5.0 4.7  CL 92* 95* 96*  CO2 23 22 21*  GLUCOSE 146* 103* 129*  BUN 83* 75* 67*  CREATININE 3.42* 2.82* 2.44*  CALCIUM 8.8* 8.0* 8.3*  PROT 6.2* 6.5 6.7  ALBUMIN 2.7* 2.9* 3.0*  AST 155* 118* 99*   ALT 122* 122* 111*  ALKPHOS 142* 145* 143*  BILITOT 3.4* 2.1* 1.9*  GFRNONAA 20* 26* 30*  ANIONGAP 13 10 12     Hematology Recent Labs  Lab 06/19/21 0140 06/20/21 0426 06/21/21 0152  WBC 12.4* 10.1 8.6  RBC 4.21* 4.59 4.55  HGB 11.1* 12.0* 11.9*  HCT 32.9* 36.1* 36.3*  MCV 78.1* 78.6* 79.8*  MCH 26.4 26.1 26.2  MCHC 33.7 33.2 32.8  RDW 17.3* 17.9* 17.8*  PLT 308 314 297   BNP Recent Labs  Lab 06/21/21 0152  BNP 2,176.6*    DDimer No results for input(s): DDIMER in the last 168 hours.   Radiology  DG CHEST PORT 1 VIEW  Result Date: 06/21/2021 CLINICAL DATA:  Dyspnea EXAM: PORTABLE CHEST 1 VIEW COMPARISON:  06/17/2021 FINDINGS: Pulmonary insufflation has slightly diminished in the interval since prior examination. There has developed mild bilateral perihilar interstitial pulmonary infiltrate most in keeping with mild cardiogenic failure. No pneumothorax or pleural effusion. Moderate cardiomegaly is stable. Left internal jugular chest port is again seen with its tip within the superior vena cava. No acute bone abnormality. IMPRESSION: Interval development of mild cardiogenic failure. Stable moderate cardiomegaly. Electronically Signed   By: Fidela Salisbury MD   On: 06/21/2021 01:53   [place] EKG SITE RITE  Result Date: 06/21/2021 If Site Rite image not attached, placement could not be confirmed due to current cardiac rhythm.   Cardiac Studies  TTE 10/04/2020  1. 3 D images of the LV were obtained. . Left ventricular ejection  fraction, by estimation, is 20 to 25%. The left ventricle has severely  decreased function. The left ventricle demonstrates global hypokinesis.  The left ventricular internal cavity size  was moderately dilated. There is mild left ventricular hypertrophy. Left  ventricular diastolic parameters are consistent with Grade II diastolic  dysfunction (pseudonormalization).   2. Right ventricular systolic function is low normal. The right  ventricular size is  normal. There is moderately elevated pulmonary artery  systolic pressure. The estimated right ventricular systolic pressure is  79.0 mmHg.   3. Left atrial size was moderately dilated.   4. Right atrial size was severely dilated.   5. The mitral valve is grossly normal. Moderate to severe mitral valve  regurgitation. Mild mitral stenosis.   6. Tricuspid valve regurgitation is moderate to severe.   7. The aortic valve is normal in structure. Aortic valve regurgitation is  not visualized. No aortic stenosis is present.  Patient Profile  Tyler Wong is a 55 y.o. 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  Cardiology was consulted on 06/21/2021 due to persistent lactic acidosis and decompensated systolic heart failure.  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  Was seen by his oncologist for abdominal pain.  CT scan showed a possible cholecystitis. -HIDA scan negative. -Pro-Cal was elevated.  He is on antibiotics. -Highly suspect this was all congestion in the setting of decompensated low output congestive heart failure. -Evaluated by me 06/21/2021.  Grossly volume overloaded.  BP soft and tenuous.  Discussed his case with advanced heart failure.  He is not a candidate for advanced therapies.  He has significant CKD and persistent cocaine use.  He is admitted frequently to the hospital for low output symptoms and given brief inotropes.  He then returned to using  cocaine.  I discussed with him that he is not a candidate for palliative inotropes.  I think the only thing I can offer him is diuresis.  I did discuss this with him.  He has improved with diuresis overnight. -There were multiple discussions from the advanced heart failure clinic.  Please refer to those notes. -No beta-blocker.  No ACE/ARB/Arni/MRA given CKD.  CKD also precludes digoxin therapy. -For now we will continue with Lasix 160 mg IV twice daily.  Palliative care saw him yesterday.  He does not want to see them.  I have told him that he is not appropriate for full code.  I will discuss this with his daughter as well. -Overall very poor prognosis.  No options.  For questions or updates, please contact [place] Please consult [email or web address] for contact info under   Time Spent with Patient: I have spent a total of 35 minutes with patient reviewing hospital notes, telemetry, EKGs, labs and examining the patient as well as establishing an assessment and plan that was discussed with the patient.  > 50% of time was spent in direct patient care.    Signed, Addison Naegeli. Audie Box, MD, [place]  06/22/2021 8:12 AM

## 2021-06-22 NOTE — Progress Notes (Signed)
[facility] Gastroenterology Progress Note  Kishawn Pickar 55 y.o. 1966/05/31   Subjective: Tolerated solid food for dinner. Denies abdominal pain/N/V. Reports formed stool this afternoon.  Objective: Vital signs: Vitals:   06/22/21 0207 06/22/21 0548  BP: (!) 81/65 114/87  Pulse: 100 99  Resp: 16 18  Temp: 97.8 F (36.6 C) 97.7 F (36.5 C)  SpO2: 100% 100%    Physical Exam: Gen: alert, no acute distress, thin  HEENT: anicteric sclera CV: RRR Chest: CTA B Abd: soft, nontender, nondistended, +BS Ext: no edema  Lab Results: Recent Labs    06/21/21 0152 06/22/21 0733  NA 129* 135  K 4.7 4.2  CL 96* 101  CO2 21* 23  GLUCOSE 129* 142*  BUN 67* 43*  CREATININE 2.44* 1.87*  CALCIUM 8.3* 8.8*   Recent Labs    06/21/21 0152 06/22/21 0733  AST 99* 68*  ALT 111* 83*  ALKPHOS 143* 128*  BILITOT 1.9* 1.8*  PROT 6.7 6.0*  ALBUMIN 3.0* 2.5*   Recent Labs    06/20/21 0426 06/21/21 0152  WBC 10.1 8.6  HGB 12.0* 11.9*  HCT 36.1* 36.3*  MCV 78.6* 79.8*  PLT 314 297      Assessment/Plan: Improving LFTs. High risk to undergo repeat EGD with sedation due to severe CHF and therefore will not repeat an EGD for clip markings as previously requested. Supportive care with Reglan (temporarily) prn. Tolerating solid food. Defer to Dr. Lisbeth Renshaw on whether to proceed with planned outpt radiation therapy for his gastric cancer in setting of this CHF exacerbation from ongoing cocaine use. GI will sign off. No f/u needed. Call us back if questions.   [name] 06/22/2021, 6:45 PM  Questions please call [phone] Patient ID: [name], male   DOB: [birth year], 54 y.o.   MRN: [id number]

## 2021-06-23 ENCOUNTER — Telehealth: Payer: Self-pay

## 2021-06-23 ENCOUNTER — Encounter (HOSPITAL_COMMUNITY): Payer: Self-pay | Admitting: Gastroenterology

## 2021-06-23 ENCOUNTER — Other Ambulatory Visit (HOSPITAL_COMMUNITY): Payer: Self-pay

## 2021-06-23 ENCOUNTER — Other Ambulatory Visit: Payer: Self-pay | Admitting: Oncology

## 2021-06-23 ENCOUNTER — Ambulatory Visit: Payer: Medicaid Other | Admitting: Radiation Oncology

## 2021-06-23 DIAGNOSIS — F1410 Cocaine abuse, uncomplicated: Secondary | ICD-10-CM

## 2021-06-23 DIAGNOSIS — N170 Acute kidney failure with tubular necrosis: Secondary | ICD-10-CM

## 2021-06-23 DIAGNOSIS — I5043 Acute on chronic combined systolic (congestive) and diastolic (congestive) heart failure: Secondary | ICD-10-CM

## 2021-06-23 DIAGNOSIS — I2510 Atherosclerotic heart disease of native coronary artery without angina pectoris: Secondary | ICD-10-CM

## 2021-06-23 DIAGNOSIS — N1832 Chronic kidney disease, stage 3b: Secondary | ICD-10-CM

## 2021-06-23 LAB — COMPREHENSIVE METABOLIC PANEL
ALT: 76 U/L — ABNORMAL HIGH (ref 0–44)
AST: 57 U/L — ABNORMAL HIGH (ref 15–41)
Albumin: 2.5 g/dL — ABNORMAL LOW (ref 3.5–5.0)
Alkaline Phosphatase: 117 U/L (ref 38–126)
Anion gap: 10 (ref 5–15)
BUN: 32 mg/dL — ABNORMAL HIGH (ref 6–20)
CO2: 27 mmol/L (ref 22–32)
Calcium: 8.4 mg/dL — ABNORMAL LOW (ref 8.9–10.3)
Chloride: 95 mmol/L — ABNORMAL LOW (ref 98–111)
Creatinine, Ser: 1.72 mg/dL — ABNORMAL HIGH (ref 0.61–1.24)
GFR, Estimated: 46 mL/min — ABNORMAL LOW (ref >60)
Glucose, Bld: 174 mg/dL — ABNORMAL HIGH (ref 70–99)
Potassium: 3.6 mmol/L (ref 3.5–5.1)
Sodium: 132 mmol/L — ABNORMAL LOW (ref 135–145)
Total Bilirubin: 1.2 mg/dL (ref 0.3–1.2)
Total Protein: 6.3 g/dL — ABNORMAL LOW (ref 6.5–8.1)

## 2021-06-23 LAB — SURGICAL PATHOLOGY

## 2021-06-23 MED ORDER — FLUCONAZOLE 200 MG PO TABS: 200.0000 mg | ORAL_TABLET | Freq: Every day | ORAL | 0 refills | Status: DC

## 2021-06-23 MED ORDER — METOCLOPRAMIDE HCL 5 MG PO TABS: 5.0000 mg | ORAL_TABLET | Freq: Three times a day (TID) | ORAL | 0 refills | Status: DC | PRN

## 2021-06-23 MED ORDER — CAPECITABINE 500 MG PO TABS
ORAL_TABLET | ORAL | 0 refills | Status: DC
  Filled 2021-06-23: qty 168, fill #0

## 2021-06-23 MED ORDER — TORSEMIDE 40 MG PO TABS: 80.0000 mg | ORAL_TABLET | Freq: Every day | ORAL | 1 refills | Status: DC

## 2021-06-23 MED ORDER — POTASSIUM CHLORIDE CRYS ER 20 MEQ PO TBCR
40.0000 meq | EXTENDED_RELEASE_TABLET | Freq: Once | ORAL | Status: AC
  Administered 2021-06-23: 40 meq via ORAL
  Filled 2021-06-23: qty 2

## 2021-06-23 NOTE — Progress Notes (Addendum)
HEMATOLOGY-ONCOLOGY PROGRESS NOTE  SUBJECTIVE: Abdominal pain improved at this time.  Lower extremity edema also improving.  Oncology History  Gastric cancer ([place])  11/29/2020 Initial Diagnosis   Gastric cancer ([place])    12/12/2020 -  Chemotherapy    Patient is on Treatment Plan: GASTROESOPHAGEAL FOLFOX Q14D X 6 CYCLES        PHYSICAL EXAMINATION:  Vitals:   06/23/21 0553 06/23/21 0800  BP: 100/87 100/78  Pulse: 100 92  Resp: 18 18  Temp: 98.2 F (36.8 C) 97.7 F (36.5 C)  SpO2:  100%   Filed Weights   06/20/21 0500 06/22/21 0548 06/23/21 0553  Weight: 79.5 kg 77.4 kg 75 kg    Intake/Output from previous day: 06/26 0701 - 06/27 0700 In: 1130 [P.O.:1080; IV Piggyback:50] Out: 2694 [Urine:3750] HEENT: No thrush ABDOMEN: Mildly distended, tender in the upper abdomen NEURO: alert & oriented x 3 with fluent speech, no focal motor/sensory deficits Vascular: Trace pitting edema at the lower leg and foot bilaterally  LABORATORY DATA:  I have reviewed the data as listed CMP Latest Ref Rng & Units 06/23/2021 06/22/2021 06/21/2021  Glucose 70 - 99 mg/dL 174(H) 142(H) 129(H)  BUN 6 - 20 mg/dL 32(H) 43(H) 67(H)  Creatinine 0.61 - 1.24 mg/dL 1.72(H) 1.87(H) 2.44(H)  Sodium 135 - 145 mmol/L 132(L) 135 129(L)  Potassium 3.5 - 5.1 mmol/L 3.6 4.2 4.7  Chloride 98 - 111 mmol/L 95(L) 101 96(L)  CO2 22 - 32 mmol/L 27 23 21(L)  Calcium 8.9 - 10.3 mg/dL 8.4(L) 8.8(L) 8.3(L)  Total Protein 6.5 - 8.1 g/dL 6.3(L) 6.0(L) 6.7  Total Bilirubin 0.3 - 1.2 mg/dL 1.2 1.8(H) 1.9(H)  Alkaline Phos 38 - 126 U/L 117 128(H) 143(H)  AST 15 - 41 U/L 57(H) 68(H) 99(H)  ALT 0 - 44 U/L 76(H) 83(H) 111(H)    Lab Results  Component Value Date   WBC 8.6 06/21/2021   HGB 11.9 (L) 06/21/2021   HCT 36.3 (L) 06/21/2021   MCV 79.8 (L) 06/21/2021   PLT 297 06/21/2021   NEUTROABS 8.1 (H) 06/18/2021    CT ABDOMEN PELVIS WO CONTRAST  Result Date: 06/17/2021 CLINICAL DATA:  Sepsis Abdominal pain, acute,  nonlocalized. Dx with "abdominal" cancer and had PET scan yesterday and oncologist called him today and said he has an inflamed gal bladder and that he should go to the ED EXAM: CT ABDOMEN AND PELVIS WITHOUT CONTRAST TECHNIQUE: Multidetector CT imaging of the abdomen and pelvis was performed following the standard protocol without IV contrast. COMPARISON:  Ultrasound abdomen 06/17/2021, CT abdomen pelvis 06/16/2021, CT abdomen pelvis 02/20/2021, PET CT 11/28/2020 FINDINGS: Lower chest: Cardiomegaly.  Persistent trace pericardial effusion. Hepatobiliary: No focal liver abnormality. Gallbladder wall thickening and haziness. No CT evidence of a calcified stone. No biliary dilatation. Pancreas: No focal lesion. Normal pancreatic contour. No surrounding inflammatory changes. No main pancreatic ductal dilatation. Spleen: Normal in size without focal abnormality. Adrenals/Urinary Tract: No adrenal nodule bilaterally. No nephrolithiasis, no hydronephrosis, and no contour-deforming renal mass. No ureterolithiasis or hydroureter. The urinary bladder is unremarkable. Stomach/Bowel: PO contrast reaches the rectum. Haziness as well as question of wall thickening of the distal duodenum with associated fat stranding and free fluid within the upper abdomen. Stomach is within normal limits. No evidence of large bowel wall thickening or dilatation. The appendix not definitely identified. Vascular/Lymphatic: No abdominal aorta or iliac aneurysm. Mild atherosclerotic plaque of the aorta and its branches. No abdominal, pelvic, or inguinal lymphadenopathy. Reproductive: Prostate is unremarkable. Other: No intraperitoneal  free fluid. No intraperitoneal free gas. No organized fluid collection. Musculoskeletal: No abdominal wall hernia or abnormality. No suspicious lytic or blastic osseous lesions. No acute displaced fracture. IMPRESSION: 1. Findings concerning for acalculous acute cholecystitis. Differential diagnosis of nonspecific stand  alone gallbladder wall thickening includes chronic liver disease versus malignancy. A HIDA scan could be obtained for further evaluation of likely acalculous acute cholecystitis. 2. Haziness as well as question of wall thickening of the distal duodenum with associated fat stranding and free fluid within the upper abdomen. Finding may represent reactive changes versus duodenitis. 3. Given upper abdominal fat stranding in the vicinity of the pancreas, consider evaluation with lipase levels. 4. Trace simple free fluid ascites. 5. Markedly limited evaluation due to noncontrast study in a patient with known malignancy. Electronically Signed   By: Iven Finn M.D.   On: 06/17/2021 22:23   CT ABDOMEN PELVIS WO CONTRAST  Result Date: 06/17/2021 CLINICAL DATA:  Restaging gastric cancer, malignancy of pyloric antrum, ongoing chemotherapy EXAM: CT ABDOMEN AND PELVIS WITHOUT CONTRAST TECHNIQUE: Multidetector CT imaging of the abdomen and pelvis was performed following the standard protocol without IV contrast. Oral enteric contrast was administered. COMPARISON:  02/20/2021 FINDINGS: Examination is generally limited by breath motion artifact and lack of intravenous contrast. Lower chest: No acute abnormality. Hepatobiliary: No solid liver abnormality is seen. The gallbladder wall appears thickened (series 2, image 29). No visible gallstones or biliary ductal dilatation. Pancreas: Unremarkable. No pancreatic ductal dilatation. Spleen: Normal in size without significant abnormality. Adrenals/Urinary Tract: Adrenal glands are unremarkable. Kidneys are normal, without renal calculi, solid lesion, or hydronephrosis. Bladder is unremarkable. Stomach/Bowel: No evidence of residual or recurrent mass of the gastric antrum (series 2, image 29). There is new, ill-defined fat stranding about the descending and transverse duodenum as well as the central small bowel mesentery (series 2, image 32, 37). Appendix appears normal.  Vascular/Lymphatic: No significant vascular findings are present. No enlarged abdominal or pelvic lymph nodes. Reproductive: No mass or other significant abnormality. Other: No abdominal wall hernia or abnormality. New small volume ascites throughout the abdomen and pelvis. Musculoskeletal: No acute or significant osseous findings. IMPRESSION: 1. Examination is generally limited by breath motion artifact and lack of intravenous contrast. 2. Within this limitation, no evidence of residual or recurrent mass of the gastric antrum. 3. The gallbladder wall appears thickened. No visible gallstones or biliary ductal dilatation. Findings are concerning for acute cholecystitis. 4. There is new, ill-defined fat stranding about the descending and transverse duodenum as well as the central small bowel mesentery, likely reactive to findings of the adjacent gallbladder. 5. New small volume ascites throughout the abdomen and pelvis. These results will be called to the ordering clinician or representative by the Radiologist Assistant, and communication documented in the PACS or [employer]. Electronically Signed   By: Eddie Candle M.D.   On: 06/17/2021 08:59   NM Hepatobiliary Liver Func  Result Date: 06/18/2021 CLINICAL DATA:  Elevated bilirubin: 2.4 (06/18/21) HPI: 55 year old male with past medical history of gastric cancer (Dx 10/2010), chronic kidney disease stage IIIb, EXAM: NUCLEAR MEDICINE HEPATOBILIARY IMAGING TECHNIQUE: Sequential images of the abdomen were obtained out to 60 minutes following intravenous administration of radiopharmaceutical. RADIOPHARMACEUTICALS:  7.6 mCi Tc-11m Choletec IV COMPARISON:  CT 621 22 FINDINGS: Prompt uptake and biliary excretion of activity by the liver is seen. Gallbladder activity is visualized, consistent with patency of cystic duct. Biliary activity passes into small bowel, consistent with patent common bile duct. IMPRESSION: 1. Patent  cystic duct and common bile duct. 2. No  evidence acute cholecystitis. Electronically Signed   By: Suzy Bouchard M.D.   On: 06/18/2021 14:42   US RENAL  Result Date: 06/19/2021 CLINICAL DATA:  Acute kidney injury, history hypertension, CHF, smoking, gastric cancer EXAM: RENAL / URINARY TRACT ULTRASOUND COMPLETE COMPARISON:  CT abdomen and pelvis 06/17/2021 FINDINGS: Right Kidney: Renal measurements: 11.3 x 4.6 x 5.3 cm = volume: 144 mL. Normal cortical thickness. Increased cortical echogenicity. No mass, hydronephrosis, or shadowing calcification. Left Kidney: Renal measurements: 11.3 x 5.7 x 5.2 cm = volume: 175 mL. Less well visualized due to suboptimal acoustic window and bowel. Normal cortical thickness. Increased cortical echogenicity. No mass, hydronephrosis, or shadowing calcification. Bladder: Incompletely distended, which may account for slight prominence of the wall. No gross mass. Other: N/A IMPRESSION: No definite renal sonographic abnormalities identified. Electronically Signed   By: Lavonia Dana M.D.   On: 06/19/2021 16:51   MR ABDOMEN MRCP WO CONTRAST  Result Date: 06/20/2021 CLINICAL DATA:  Right upper quadrant and epigastric pain for 7 months. History gastric cancer. Abnormal CT scan. EXAM: MRI ABDOMEN WITHOUT CONTRAST  (INCLUDING MRCP) TECHNIQUE: Multiplanar multisequence MR imaging of the abdomen was performed. Heavily T2-weighted images of the biliary and pancreatic ducts were obtained, and three-dimensional MRCP images were rendered by post processing. COMPARISON:  Multiple prior imaging studies. Nuclear medicine hepatic biliary scan 06/18/2021 and CT scan 06/17/2021 FINDINGS: This examination is quite limited due to respiratory motion. The patient could not his breath for the examination. Lower chest: Stable cardiac enlargement without pericardial effusion. No obvious pleural effusions or lung base lesions. Hepatobiliary: No obvious hepatic lesions or intrahepatic biliary dilatation. Stable diffuse gallbladder wall  thickening likely due to low albumin. No gallstones. Grossly normal caliber and course of the common bile duct. Pancreas:  No mass, inflammation or ductal dilatation. Spleen:  Normal size.  No definite lesions. Adrenals/Urinary Tract: The adrenal glands and kidneys are grossly normal. Stomach/Bowel: No gross abnormality involving the stomach. Difficult to identify the patient has gastric cancer on this study. Duodenum and visualized small-bowel loops are grossly normal. Vascular/Lymphatic: The aorta is normal in caliber. No obvious mesenteric retroperitoneal adenopathy. Other:  No ascites but there is diffuse mesenteric edema. Musculoskeletal: No significant bony findings. IMPRESSION: 1. Very limited examination due to respiratory motion. 2. Stable diffuse gallbladder wall thickening likely due to hepatic dysfunction and low albumin. 3. Grossly normal caliber and course of the common bile duct. 4. No obvious mesenteric or retroperitoneal adenopathy. 5. Difficult to identify the patient's gastric cancer on this study. Electronically Signed   By: Marijo Sanes M.D.   On: 06/20/2021 09:26   DG CHEST PORT 1 VIEW  Result Date: 06/21/2021 CLINICAL DATA:  Dyspnea EXAM: PORTABLE CHEST 1 VIEW COMPARISON:  06/17/2021 FINDINGS: Pulmonary insufflation has slightly diminished in the interval since prior examination. There has developed mild bilateral perihilar interstitial pulmonary infiltrate most in keeping with mild cardiogenic failure. No pneumothorax or pleural effusion. Moderate cardiomegaly is stable. Left internal jugular chest port is again seen with its tip within the superior vena cava. No acute bone abnormality. IMPRESSION: Interval development of mild cardiogenic failure. Stable moderate cardiomegaly. Electronically Signed   By: Fidela Salisbury MD   On: 06/21/2021 01:53   DG Chest Port 1 View  Result Date: 06/17/2021 CLINICAL DATA:  Nausea EXAM: PORTABLE CHEST 1 VIEW COMPARISON:  05/14/2021 FINDINGS: Cardiac  shadow is enlarged but stable. Left chest wall port is again seen. Lungs are  well aerated bilaterally. No focal infiltrate or sizable effusion is seen. No acute bony abnormality is noted. IMPRESSION: Stable cardiomegaly.  No focal infiltrate is seen. Electronically Signed   By: Inez Catalina M.D.   On: 06/17/2021 20:27   [place] EKG SITE RITE  Result Date: 06/21/2021 If Site Rite image not attached, placement could not be confirmed due to current cardiac rhythm.  US Abdomen Limited RUQ (LIVER/GB)  Result Date: 06/17/2021 CLINICAL DATA:  Right upper quadrant pain EXAM: ULTRASOUND ABDOMEN LIMITED RIGHT UPPER QUADRANT COMPARISON:  CT 06/16/2021 FINDINGS: Gallbladder: No shadowing stones. Increased gallbladder wall thickness up to 6 mm. Negative sonographic Murphy. Common bile duct: Diameter: 3.1 mm Liver: Echogenicity within normal limits. Shadowing calcification within the right hepatic lobe. Portal vein is patent on color Doppler imaging with normal direction of blood flow towards the liver. Other: None. IMPRESSION: 1. Gallbladder wall thickening without shadowing stone or sonographic Murphy. Findings are nonspecific. If acute gallbladder disease remains a concern, further evaluation with hepatobiliary nuclear medicine imaging could be obtained Electronically Signed   By: Donavan Foil M.D.   On: 06/17/2021 20:09    ASSESSMENT AND PLAN: Gastric cancer CT abdomen/pelvis 11/01/2020-masslike area measuring 6.3 x 5.6 x 5.6 cm in the antral prepyloric region of the stomach Upper endoscopy 11/05/2020-patchy white plaques in the distal esophagus; 3 nonbleeding cratered gastric ulcers with pigmented material in the gastric antrum largest 10 mm; segmental moderate inflammation characterized by congestion, erosions and erythema in the gastric antrum.  Biopsy of stomach ulcer-intramucosal adenocarcinoma arising in a background of intestinal metaplasia, HER-2 negative PET 11/2020-focal area of hypermetabolic activity at  the gastric antrum, averages into the liver and small liver lesion adjacent to stomach not excluded, small chest lymph nodes with mild increase in metabolic activity-nonspecific, symmetric parotid and extraocular muscle activity-likely physiologic Cycle 1 FOLFOX 12/12/2020 Cycle 2 FOLFOX 12/26/2020 Cycle 3 FOLFOX 01/08/2021 Cycle 4 FOLFOX 01/22/2021 Cycle 5 FOLFOX 02/05/2021-Udenyca Cycle 6 FOLFOX 02/19/2021-Udenyca CTs 02/20/2021-previously noted mass of the posterior gastric antrum no longer seen.  No lymphadenopathy or metastatic disease in the abdomen or pelvis. CT abdomen/pelvis 06/17/2021-haziness and question of wall thickening at the distal duodenum with free fluid in the upper abdomen MRI abdomen/MRCP 06/20/2021-diffuse gallbladder wall thickening, no evidence of mesenteric or retroperitoneal adenopathy, primary gastric mass not appreciated Upper endoscopy 06/20/2021-large amount of food residue in the gastric body, pylorus widely patent, no obvious tumor or ulceration, duodenum normal CHF, LVEF 20-25% 10/04/2020 CAD status post prior LAD stent COPD Chronic kidney disease Cocaine usage Port-A-Cath placement 12/06/2020, Interventional Radiology Mild neutropenia secondary to chemotherapy-Udenyca added with cycle 5 FOLFOX Hospital admission 05/13/2021- acute exacerbation of CHF/end-stage heart failure Hospital admission 06/17/2021-abdominal pain  Tyler Wong has improvement in his abdominal pain today.  He is not having any nausea or vomiting.  EGD was performed 6/24 which showed a large amount of retained food in his stomach.  He has been started on Reglan with improvement of his abdominal pain.  Cardiology has been following and thinks presentation is related to CHF.  They are diuresing, but options are limited for treatment of his CHF.  The patient was scheduled to begin concurrent chemoradiation with Xeloda today.  We will plan to delay the start of his treatment.  Additionally, we will need to  recheck his renal function before consideration of Xeloda.  If renal function does not improve, may consider infusional 5-FU.  We plan to reevaluate him as an outpatient to determine his candidacy for additional treatment.  Recommendations: 1.  Diuretics per cardiology service. 2.  Will reschedule his planned concurrent chemoradiation which was scheduled to begin today. 3.  Outpatient follow-up will be scheduled in medical oncology for the week of 06/30/2021    LOS: 6 days   Tyler Wong 06/23/21 Tyler Wong was interviewed and examined.  The abdominal pain and dyspnea have improved with diuresis.  Evaluation for progressive gastric cancer during this hospital admission has been negative.  The plan is to proceed with "adjuvant "radiation and Xeloda beginning later this week.  Outpatient follow-up will be scheduled in the medical oncology for next week.  A prescription for capecitabine was entered today.  I was present for greater than 50% of today's visit.  I performed medical decision making.

## 2021-06-23 NOTE — Progress Notes (Signed)
Progress Note  Patient Name: Tyler Wong Date of Encounter: 06/23/2021  [facility] Cardiologist: Shelva Majestic, MD   Subjective   Patient reports breathing is back to baseline. No chest pain overnight. LE edema continues to improve. No palpitations. He expresses concern for losing his room at a hotel while he awaits a bed [facility]. Spoke with SW who will look into this further to determine how much longer he can remain admitted without risk to housing.   Inpatient Medications    Scheduled Meds:  aspirin EC  81 mg Oral Daily   atorvastatin  80 mg Oral Daily   enoxaparin (LOVENOX) injection  40 mg Subcutaneous Q24H   fluconazole  200 mg Oral QHS   gabapentin  100 mg Oral BID   pantoprazole  40 mg Oral BID AC   Continuous Infusions:  sodium chloride 250 mL (06/21/21 0648)   furosemide 160 mg (06/22/21 1625)   PRN Meds: sodium chloride, acetaminophen **OR** acetaminophen, albuterol, fentaNYL (SUBLIMAZE) injection **OR** fentaNYL (SUBLIMAZE) injection, metoCLOPramide (REGLAN) injection, ondansetron **OR** ondansetron (ZOFRAN) IV, oxyCODONE, phenol, polyethylene glycol   Vital Signs    Vitals:   06/22/21 0548 06/22/21 2207 06/23/21 0553 06/23/21 0800  BP: 114/87 98/86 100/87 100/78  Pulse: 99 92 100 92  Resp: 18 16 18 18   Temp: 97.7 F (36.5 C) 98 F (36.7 C) 98.2 F (36.8 C) 97.7 F (36.5 C)  TempSrc: Oral Oral Oral Oral  SpO2: 100% 100%  100%  Weight: 77.4 kg  75 kg   Height:        Intake/Output Summary (Last 24 hours) at 06/23/2021 0824 Last data filed at 06/23/2021 0100 Gross per 24 hour  Intake 1130 ml  Output 3750 ml  Net -2620 ml   Last 3 Weights 06/23/2021 06/22/2021 06/20/2021  Weight (lbs) 165 lb 6.4 oz 170 lb 9.6 oz 175 lb 3.2 oz  Weight (kg) 75.025 kg 77.384 kg 79.47 kg  Some encounter information is confidential and restricted. Go to Review Flowsheets activity to see all data.      Telemetry    Sinus rhythm/sinus tachycardia -  Personally Reviewed  ECG    No new tracings - Personally Reviewed  Physical Exam   GEN: No acute distress.   Neck: No JVD Cardiac: RRR, no murmurs, rubs, or gallops.  Respiratory: Clear to auscultation bilaterally. GI: Soft, nontender, non-distended  MS: 2+ LE edema; No deformity. Neuro:  Nonfocal  Psych: Normal affect   Labs    High Sensitivity Troponin:   Recent Labs  Lab 06/21/21 0152 06/21/21 0336 06/21/21 1132 06/21/21 1446  TROPONINIHS 622* 641* 723* 608*      Chemistry Recent Labs  Lab 06/21/21 0152 06/22/21 0733 06/23/21 0207  NA 129* 135 132*  K 4.7 4.2 3.6  CL 96* 101 95*  CO2 21* 23 27  GLUCOSE 129* 142* 174*  BUN 67* 43* 32*  CREATININE 2.44* 1.87* 1.72*  CALCIUM 8.3* 8.8* 8.4*  PROT 6.7 6.0* 6.3*  ALBUMIN 3.0* 2.5* 2.5*  AST 99* 68* 57*  ALT 111* 83* 76*  ALKPHOS 143* 128* 117  BILITOT 1.9* 1.8* 1.2  GFRNONAA 30* 42* 46*  ANIONGAP 12 11 10      Hematology Recent Labs  Lab 06/19/21 0140 06/20/21 0426 06/21/21 0152  WBC 12.4* 10.1 8.6  RBC 4.21* 4.59 4.55  HGB 11.1* 12.0* 11.9*  HCT 32.9* 36.1* 36.3*  MCV 78.1* 78.6* 79.8*  MCH 26.4 26.1 26.2  MCHC 33.7 33.2 32.8  RDW 17.3* 17.9* 17.8*  PLT 308 314 297    BNP Recent Labs  Lab 06/21/21 0152  BNP 2,176.6*     DDimer No results for input(s): DDIMER in the last 168 hours.   Radiology    [place] EKG SITE RITE  Result Date: 06/21/2021 If Site Rite image not attached, placement could not be confirmed due to current cardiac rhythm.   Cardiac Studies   TTE 10/04/2020  1. 3 D images of the LV were obtained. . Left ventricular ejection  fraction, by estimation, is 20 to 25%. The left ventricle has severely  decreased function. The left ventricle demonstrates global hypokinesis.  The left ventricular internal cavity size  was moderately dilated. There is mild left ventricular hypertrophy. Left  ventricular diastolic parameters are consistent with Grade II diastolic  dysfunction  (pseudonormalization).   2. Right ventricular systolic function is low normal. The right  ventricular size is normal. There is moderately elevated pulmonary artery  systolic pressure. The estimated right ventricular systolic pressure is  16.0 mmHg.   3. Left atrial size was moderately dilated.   4. Right atrial size was severely dilated.   5. The mitral valve is grossly normal. Moderate to severe mitral valve  regurgitation. Mild mitral stenosis.   6. Tricuspid valve regurgitation is moderate to severe.   7. The aortic valve is normal in structure. Aortic valve regurgitation is  not visualized. No aortic stenosis is present.    Patient Profile     55 y.o. male with a PMH of CAD s/p PCI/DES to LAD in 2015, chronic combined CHF, CKD stage 3b, gastric cancer s/p chemo with plans for XRT to begin next week, cocaine abuse, and tobacco abuse, who is being followed by cardiology for the evaluation of CHF.  Assessment & Plan    1. 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 BNP elevated to 2176. CXR with pulmonary edema. He has previously been evaluated by [facility] team and is not a candidate for LVAD or heart transplant, nor is he a candidate for palliative inotropes. He continues to have cocaine positive Utox which is felt to be the etiology of his cardiomyopathy; limits Bblocker use. CKD limits addition of ACE/ARB/ARNI/MRA/digoxin therapies. Seen by palliative care this admission and continues to decline services or engage in discussion. Remains full code. He was started on palliative diuresis with IV lasix 160mg  BID. UOP net -2.6L in teh past 24 hours and -319mL this admission. Weight is down to 165lbs today from 175lbs on admission. Prognosis remains poor.  - Continue IV lasix 160mg  BID - Will place TED hose - SW to assist with housing situation - if patient is at risk of losing his room at the hotel if not  discharged today, will consider transitioning to torsemide 80mg  BID.  - Continue to monitor strict I&Os and daily weights - Continue to monitor electrolytes and replete as needed to maintain K >4, Mg >2  2. CAD s/p PCI/DES to LAD in 2015: No complaints of chest pain. HsTrop peaked at 723 with flat trend not c/w ACS - suspect demand ischemia in the setting of #1 and SIRS.  - No further ischemic testing this admission - Continue aspirin and statin  3. Cocaine abuse: continues to test positive on Utox this admission. Cessation strongly advised. Likely contributing to and limiting management of #1 - Continue to encourage cessation  4. Psychosocial issues: worried about losing housing.  -  SW to assist with housing concerns  Remainder of care per primary team: - SIRS - Abdominal pain - Gastric cancer - Esophageal candidiasis - Hepatitis B/C - Hyponatremia  - COPD         For questions or updates, please contact [facility] Please consult [email or web address] for contact info under        Signed, Abigail Butts, PA-C  06/23/2021, 8:24 AM

## 2021-06-23 NOTE — Congregational Nurse Program (Signed)
  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 TC to transportation and SW at [facility]

## 2021-06-23 NOTE — TOC Progression Note (Addendum)
Transition of Care [facility]) - Progression Note    Patient Details  Name: Jorgen Wolfinger MRN: [id number] Date of Birth: 22-Jan-1966  Transition of Care [facility]) CM/SW [place], [place] Phone Number: 06/23/2021, 9:39 AM  Clinical Narrative:     CSW called and left message with [name] Congregational RN at [phone]; left message inquiring about pt's hotel situation and request return call.   [phone]: CSW received call back from [place]. She informed CSW that pt is enrolled with [facility] program and has a place at the [facility] whether he d/c's today or the next 2 days.   Expected Discharge Plan: Home/Self Care ([facility]- living in a [place].) Barriers to Discharge: Continued Medical Work up  Expected Discharge Plan and Services Expected Discharge Plan: Home/Self Care ([facility]- living in a [place].) In-house Referral:  Acupuncturist) Discharge Planning Services: CM Consult Post Acute Care Choice:  ([place].) Living arrangements for the past 2 months: Hotel/Motel ([facility])                   DME Agency: NA       HH Arranged: NA           Social Determinants of Health (SDOH) Interventions    Readmission Risk Interventions Readmission Risk Prevention Plan 06/20/2021 05/20/2021 11/04/2020  Transportation Screening Complete Complete Complete  Medication Review Press photographer) Complete - Complete  PCP or Specialist appointment within 3-5 days of discharge Complete Complete Complete  HRI or Home Care Consult Complete Complete Complete  SW Recovery Care/Counseling Consult Complete Complete Complete  Palliative Care Screening Not Applicable Not Applicable Not [place] Not Applicable Not Applicable Not Applicable  Some recent data might be hidden

## 2021-06-23 NOTE — Discharge Summary (Addendum)
Physician Discharge Summary  Tyler Wong NLG:[id number] DOB: 11/20/1966 DOA: 06/17/2021  PCP: [name], NP  Admit date: 06/17/2021 Discharge date: 06/23/2021  Time spent: 45 minutes  Recommendations for Outpatient Follow-up:  Patient will be discharged to home.  Patient will need to follow up with primary care provider within one week of discharge, repeat BMP. Follow up with cardiology, oncology, and radiation oncology, infectious disease. Patient should continue medications as prescribed.  Patient should follow a heart healthy diet.   Discharge Diagnoses:  SIRS Acute hypoxic respiratory failure Acute on Chronic combined systolic and diastolic heart failure Elevated troponin Acute kidney injury on chronic kidney disease, stage IIIb Lactic acidosis Coronary artery disease Hyperkalemia Cocaine abuse COPD Esophageal candidiasis Hepatitis B/C Hyponatremia Goals of care  Discharge Condition: Stable  Diet recommendation: heart healthy  Filed Weights   06/20/21 0500 06/22/21 0548 06/23/21 0553  Weight: 79.5 kg 77.4 kg 75 kg    History of present illness:  On 06/17/2021 by [name] 55 year old male with past medical history of gastric cancer (Dx 10/2010), chronic kidney disease stage IIIb, advanced cardiomyopathy with systolic congestive heart failure Echo 09/2020 EF 20-25%), coronary artery disease (STEMI 2015 with DES to LAD), substance abuse (cocaine), COPD, gastroesophageal reflux disease who presents [facility] emergency department at the direction of his oncologist due to concerns for cholecystitis.  Patient explains that for the past several weeks he has been experiencing increasing abdominal pain.  Patient describes his abdominal pain is epigastric in location, radiating around the abdomen, severe in intensity, sharp in quality, worse with ingestion of foods and associated with decreased appetite and generalized weakness.  Patient complains of  nausea but denies vomiting.  Patient denies fevers.  Patient denies dysuria or diarrhea.  Patient denies recent travel sick contacts or contacts with confirmed COVID-19 infection.   Patient was seen in oncology clinic on 6/20 in preparation for upcoming initiation of radiation therapy on 6/27 during this evaluation patient's symptoms prompted Dr.Sherrill to obtain CT imaging of the abdomen which revealed possible developing acute cholecystitis and therefore the patient was instructed on 6/21 to go to the emergency department for evaluation.     Upon evaluation in the emergency department patient was found to exhibit multiple SIRS criteria including tachycardia and tachypnea in setting of lactic acidosis of 2.6.  ER provider ordered 2 L of lactated Ringer solution bolus for the patient.  Patient was administered intravenous fentanyl for pain.  ER provider discussed case with Dr. Barry Dienes with general surgery who recommended obtaining a HIDA scan, admission to medicine and formal consultation of surgery if the HIDA scan is abnormal.  The hospitalist group was then called to assess the patient for admission to the hospital.  Hospital Course:  SIRS -Initially patient thought to have severe sepsis secondary to an acute calculus cholecystitis -Patient had presented with tachycardia, tachypnea, lactic acid of 5.7, with worsening abdominal pain -CT abdomen pelvis findings concerning for acute  acalculous cholecystitis -At this time no source of infection has been found- Sepsis has been ruled out -He has completed 5 doses of IV Zosyn, which has been discontinued today -Work-up included HIDA scan which was unremarkable for acute cholecystitis.  MRCP showed stable diffuse gallbladder wall thickening,normal caliber and course of common bile duct.  No obvious mesenteric or retroperitoneal adenopathy. -Gastroenterology consulted and appreciated -EGD showed large amount of retained food.  Recommendations were to  switch to liquid diet, initiate metoclopramide 5 mg IV every 6 hours, if tolerated  increase to 10 mg IV every 6 hours.  Await results of duodenal and gastric biopsies.  May need repeat endoscopy in several days. -Discussed with Dr. Michail Sermon, advanced diet to soft and to continue Reglan 5 mg every 8 hours as needed for nausea/vomiting -no further complaints of abdominal pain, N/V   Acute hypoxic respiratory failure -Likely secondary to CHF exacerbation -Oxygen saturations dropped 87%- placed on supplemental oxgyen -Resolved, patient weaned to room air maintaining oxygen saturations at 100   Acute on Chronic combined systolic and diastolic heart failure -Patient developed shortness of breath overnight -Was on IVF for AKI and elevated LFTs -Echocardiogram 10/04/2020 shows an EF of 62-56%, grade 2 diastolic dysfunction -BNP obtained 2176.6 -CXR showed mild cardiogenic failure -Monitor intake and output, daily weights -Cardiology consulted and appreciated, patient placed on high-dose IV Lasix 160 mg twice daily -Patient with end-stage cardiomyopathy and not a candidate for advanced therapies.  Cannot use beta-blocker, ACE inhibitor or ARB, given CKD which also precludes digoxin therapy   Elevated troponin -Likely secondary to CHF exac/demand ischemia -High-sensitivity troponin peaked at 723, although was trending down -no complaints of chest pain at this time   Acute kidney injury on chronic kidney disease, stage IIIb -Baseline creatinine approximate 1.8-2.1 -On admission, creatinine 2.92- peaked to 3.42 -Creatinine down to 1.72 today -Renal US was unremarkable   Lactic acidosis -Suspect secondary to SIRS vs CHF decompensation -Lactic acid 5.7 on admission- down to 3.2 -Was placed on broad-spectrum antibiotics and IVF   Coronary artery disease -Patient currently chest pain-free -Continue aspirin, statin   Hyperkalemia -Suspect secondary to renal function -No EKG changes -Resolved  with Lokelma   Cocaine abuse -Patient counseled on cessation -UDS positive for benzodiazepines and cocaine   COPD -No evidence of exacerbation at this time -Continue bronchodilator therapy as needed   Esophageal candidiasis -Patient with noted thrush on exam -Also complaining of odynophagia -Continue fluconazole for 14 days total   Hepatitis B/C -Patient had hepatitis panel done in April 2021 and positive Hep B Core Ab in April 2018 -Hepatitis C antibody was reactive, with a quantitative HCV of 28, quantitative log of 1.301 -Will discuss with infectious disease -will likely need outpatient follow up   Hyponatremia -chronic   Goals of care -Given end-stage CHF, palliative care consulted and appreciated -Several discussions have been had in the past as well as this admission, patient would like to continue being full code  Consultants General surgery by EDP Gastroenterology Cardiology Palliative care   Procedures EGD MRCP Renal US  Discharge Exam: Vitals:   06/23/21 0800 06/23/21 1137  BP: 100/78 115/78  Pulse: 92 (!) 109  Resp: 18 15  Temp: 97.7 F (36.5 C) 97.7 F (36.5 C)  SpO2: 100% 98%    General: Well developed, chronically ill-appearing, NAD HEENT: NCAT, mucous membranes moist. Cardiovascular: S1 S2 auscultated, RRR-tachycardic  Respiratory: Clear to auscultation bilaterally Abdomen: Soft, nontender, nondistended, + bowel sounds Extremities: warm dry without cyanosis clubbing. +LE edema  Neuro: AAOx3, nonfocal Psych: Appropriate mood and affect   Discharge Instructions Discharge Instructions     Diet - low sodium heart healthy   Complete by: As directed    Discharge instructions   Complete by: As directed    Patient will be discharged to home.  Patient will need to follow up with primary care provider within one week of discharge, repeat BMP. Follow up with cardiology, oncology, and radiation oncology, infectious disease. Patient should continue  medications as prescribed.  Patient should  follow a heart healthy diet.   Increase activity slowly   Complete by: As directed       Allergies as of 06/23/2021   No Known Allergies      Medication List     TAKE these medications    acidophilus Caps capsule Take 1 capsule by mouth daily.   albuterol 108 (90 Base) MCG/ACT inhaler Commonly known as: VENTOLIN HFA Inhale 2 puffs into the lungs every 4 (four) hours as needed for wheezing or shortness of breath.   Aspirin Low Dose 81 MG EC tablet Generic drug: aspirin TAKE 1 TABLET (81 MG TOTAL) BY MOUTH DAILY (MORNING) What changed: See the new instructions.   atorvastatin 80 MG tablet Commonly known as: LIPITOR Take 1 tablet (80 mg total) by mouth daily.   empagliflozin 10 MG Tabs tablet Commonly known as: Jardiance Take 1 tablet (10 mg total) by mouth daily.   fluconazole 200 MG tablet Commonly known as: DIFLUCAN Take 1 tablet (200 mg total) by mouth at bedtime.   gabapentin 100 MG capsule Commonly known as: NEURONTIN Take 1 capsule (100 mg total) by mouth 2 (two) times daily.   metoCLOPramide 5 MG tablet Commonly known as: Reglan Take 1 tablet (5 mg total) by mouth every 8 (eight) hours as needed for nausea or vomiting.   multivitamin with minerals tablet Take 1 tablet by mouth daily.   nicotine 7 mg/24hr patch Commonly known as: NICODERM CQ - dosed in mg/24 hr Place 1 patch (7 mg total) onto the skin daily.   pantoprazole 40 MG tablet Commonly known as: PROTONIX Take 1 tablet (40 mg total) by mouth 2 (two) times daily.   polyethylene glycol 17 g packet Commonly known as: MIRALAX / GLYCOLAX Take 17 g by mouth 2 (two) times daily.   potassium chloride SA 20 MEQ tablet Commonly known as: KLOR-CON Take 2 tablets (40 mEq total) by mouth daily.   Torsemide 40 MG Tabs Take 80 mg by mouth daily. What changed: See the new instructions.       No Known Allergies  Follow-up Information     [name], NP. Go on 07/24/2021.   Specialty: Internal Medicine Why: at 8:15am for hospital followup with Juluis Mire, NP Contact information: [address] [place] [address] [phone]         [facility] Follow up on 06/27/2021.   Specialty: Cardiology Why: Please arrive 15 minutes early for your 8:30am post-hospital cardiology appointment. Contact information: [address] [phone] [place]        [name], NP .   Specialty: Internal Medicine Contact information: [address] [place] [address] [phone]         [name], MD .   Specialty: Cardiology Contact information: [address] [place] [address] [phone]         [name], MD .   Specialty: Cardiology Contact information: [place] [address] [phone]         [facility] Follow up in 2 week(s).   Specialty: Infectious Diseases Why: Hepatitis follow up Contact information: [place], [place] [id number] [place] [phone]                 The results of significant diagnostics from this hospitalization (including imaging, microbiology, ancillary and laboratory) are listed below for  reference.    Significant Diagnostic Studies: CT ABDOMEN PELVIS WO CONTRAST  Result Date: 06/17/2021 CLINICAL DATA:  Sepsis Abdominal pain, acute, nonlocalized. Dx with "abdominal" cancer and had PET scan yesterday and oncologist called him today and said he has an inflamed gal bladder and that he should go to the ED EXAM: CT ABDOMEN AND PELVIS WITHOUT CONTRAST TECHNIQUE: Multidetector CT imaging of the abdomen and pelvis was performed following the standard protocol without IV contrast. COMPARISON:  Ultrasound abdomen 06/17/2021, CT  abdomen pelvis 06/16/2021, CT abdomen pelvis 02/20/2021, PET CT 11/28/2020 FINDINGS: Lower chest: Cardiomegaly.  Persistent trace pericardial effusion. Hepatobiliary: No focal liver abnormality. Gallbladder wall thickening and haziness. No CT evidence of a calcified stone. No biliary dilatation. Pancreas: No focal lesion. Normal pancreatic contour. No surrounding inflammatory changes. No main pancreatic ductal dilatation. Spleen: Normal in size without focal abnormality. Adrenals/Urinary Tract: No adrenal nodule bilaterally. No nephrolithiasis, no hydronephrosis, and no contour-deforming renal mass. No ureterolithiasis or hydroureter. The urinary bladder is unremarkable. Stomach/Bowel: PO contrast reaches the rectum. Haziness as well as question of wall thickening of the distal duodenum with associated fat stranding and free fluid within the upper abdomen. Stomach is within normal limits. No evidence of large bowel wall thickening or dilatation. The appendix not definitely identified. Vascular/Lymphatic: No abdominal aorta or iliac aneurysm. Mild atherosclerotic plaque of the aorta and its branches. No abdominal, pelvic, or inguinal lymphadenopathy. Reproductive: Prostate is unremarkable. Other: No intraperitoneal free fluid. No intraperitoneal free gas. No organized fluid collection. Musculoskeletal: No abdominal wall hernia or abnormality. No suspicious lytic or blastic osseous lesions. No acute displaced fracture. IMPRESSION: 1. Findings concerning for acalculous acute cholecystitis. Differential diagnosis of nonspecific stand alone gallbladder wall thickening includes chronic liver disease versus malignancy. A HIDA scan could be obtained for further evaluation of likely acalculous acute cholecystitis. 2. Haziness as well as question of wall thickening of the distal duodenum with associated fat stranding and free fluid within the upper abdomen. Finding may represent reactive changes versus duodenitis. 3. Given  upper abdominal fat stranding in the vicinity of the pancreas, consider evaluation with lipase levels. 4. Trace simple free fluid ascites. 5. Markedly limited evaluation due to noncontrast study in a patient with known malignancy. Electronically Signed   By: Iven Finn M.D.   On: 06/17/2021 22:23   CT ABDOMEN PELVIS WO CONTRAST  Result Date: 06/17/2021 CLINICAL DATA:  Restaging gastric cancer, malignancy of pyloric antrum, ongoing chemotherapy EXAM: CT ABDOMEN AND PELVIS WITHOUT CONTRAST TECHNIQUE: Multidetector CT imaging of the abdomen and pelvis was performed following the standard protocol without IV contrast. Oral enteric contrast was administered. COMPARISON:  02/20/2021 FINDINGS: Examination is generally limited by breath motion artifact and lack of intravenous contrast. Lower chest: No acute abnormality. Hepatobiliary: No solid liver abnormality is seen. The gallbladder wall appears thickened (series 2, image 29). No visible gallstones or biliary ductal dilatation. Pancreas: Unremarkable. No pancreatic ductal dilatation. Spleen: Normal in size without significant abnormality. Adrenals/Urinary Tract: Adrenal glands are unremarkable. Kidneys are normal, without renal calculi, solid lesion, or hydronephrosis. Bladder is unremarkable. Stomach/Bowel: No evidence of residual or recurrent mass of the gastric antrum (series 2, image 29). There is new, ill-defined fat stranding about the descending and transverse duodenum as well as the central small bowel mesentery (series 2, image 32, 37). Appendix appears normal. Vascular/Lymphatic: No significant vascular findings are present. No enlarged abdominal or pelvic lymph nodes. Reproductive: No mass or other significant abnormality. Other: No abdominal wall hernia or abnormality. New  small volume ascites throughout the abdomen and pelvis. Musculoskeletal: No acute or significant osseous findings. IMPRESSION: 1. Examination is generally limited by breath motion  artifact and lack of intravenous contrast. 2. Within this limitation, no evidence of residual or recurrent mass of the gastric antrum. 3. The gallbladder wall appears thickened. No visible gallstones or biliary ductal dilatation. Findings are concerning for acute cholecystitis. 4. There is new, ill-defined fat stranding about the descending and transverse duodenum as well as the central small bowel mesentery, likely reactive to findings of the adjacent gallbladder. 5. New small volume ascites throughout the abdomen and pelvis. These results will be called to the ordering clinician or representative by the Radiologist Assistant, and communication documented in the PACS or [employer]. Electronically Signed   By: Eddie Candle M.D.   On: 06/17/2021 08:59   NM Hepatobiliary Liver Func  Result Date: 06/18/2021 CLINICAL DATA:  Elevated bilirubin: 2.4 (06/18/21) HPI: 55 year old male with past medical history of gastric cancer (Dx 10/2010), chronic kidney disease stage IIIb, EXAM: NUCLEAR MEDICINE HEPATOBILIARY IMAGING TECHNIQUE: Sequential images of the abdomen were obtained out to 60 minutes following intravenous administration of radiopharmaceutical. RADIOPHARMACEUTICALS:  7.6 mCi Tc-66m  Choletec IV COMPARISON:  CT 621 22 FINDINGS: Prompt uptake and biliary excretion of activity by the liver is seen. Gallbladder activity is visualized, consistent with patency of cystic duct. Biliary activity passes into small bowel, consistent with patent common bile duct. IMPRESSION: 1. Patent cystic duct and common bile duct. 2. No evidence acute cholecystitis. Electronically Signed   By: Suzy Bouchard M.D.   On: 06/18/2021 14:42   US RENAL  Result Date: 06/19/2021 CLINICAL DATA:  Acute kidney injury, history hypertension, CHF, smoking, gastric cancer EXAM: RENAL / URINARY TRACT ULTRASOUND COMPLETE COMPARISON:  CT abdomen and pelvis 06/17/2021 FINDINGS: Right Kidney: Renal measurements: 11.3 x 4.6 x 5.3 cm = volume:  144 mL. Normal cortical thickness. Increased cortical echogenicity. No mass, hydronephrosis, or shadowing calcification. Left Kidney: Renal measurements: 11.3 x 5.7 x 5.2 cm = volume: 175 mL. Less well visualized due to suboptimal acoustic window and bowel. Normal cortical thickness. Increased cortical echogenicity. No mass, hydronephrosis, or shadowing calcification. Bladder: Incompletely distended, which may account for slight prominence of the wall. No gross mass. Other: N/A IMPRESSION: No definite renal sonographic abnormalities identified. Electronically Signed   By: Lavonia Dana M.D.   On: 06/19/2021 16:51   MR ABDOMEN MRCP WO CONTRAST  Result Date: 06/20/2021 CLINICAL DATA:  Right upper quadrant and epigastric pain for 7 months. History gastric cancer. Abnormal CT scan. EXAM: MRI ABDOMEN WITHOUT CONTRAST  (INCLUDING MRCP) TECHNIQUE: Multiplanar multisequence MR imaging of the abdomen was performed. Heavily T2-weighted images of the biliary and pancreatic ducts were obtained, and three-dimensional MRCP images were rendered by post processing. COMPARISON:  Multiple prior imaging studies. Nuclear medicine hepatic biliary scan 06/18/2021 and CT scan 06/17/2021 FINDINGS: This examination is quite limited due to respiratory motion. The patient could not his breath for the examination. Lower chest: Stable cardiac enlargement without pericardial effusion. No obvious pleural effusions or lung base lesions. Hepatobiliary: No obvious hepatic lesions or intrahepatic biliary dilatation. Stable diffuse gallbladder wall thickening likely due to low albumin. No gallstones. Grossly normal caliber and course of the common bile duct. Pancreas:  No mass, inflammation or ductal dilatation. Spleen:  Normal size.  No definite lesions. Adrenals/Urinary Tract: The adrenal glands and kidneys are grossly normal. Stomach/Bowel: No gross abnormality involving the stomach. Difficult to identify the patient has gastric cancer  on this  study. Duodenum and visualized small-bowel loops are grossly normal. Vascular/Lymphatic: The aorta is normal in caliber. No obvious mesenteric retroperitoneal adenopathy. Other:  No ascites but there is diffuse mesenteric edema. Musculoskeletal: No significant bony findings. IMPRESSION: 1. Very limited examination due to respiratory motion. 2. Stable diffuse gallbladder wall thickening likely due to hepatic dysfunction and low albumin. 3. Grossly normal caliber and course of the common bile duct. 4. No obvious mesenteric or retroperitoneal adenopathy. 5. Difficult to identify the patient's gastric cancer on this study. Electronically Signed   By: Marijo Sanes M.D.   On: 06/20/2021 09:26   DG CHEST PORT 1 VIEW  Result Date: 06/21/2021 CLINICAL DATA:  Dyspnea EXAM: PORTABLE CHEST 1 VIEW COMPARISON:  06/17/2021 FINDINGS: Pulmonary insufflation has slightly diminished in the interval since prior examination. There has developed mild bilateral perihilar interstitial pulmonary infiltrate most in keeping with mild cardiogenic failure. No pneumothorax or pleural effusion. Moderate cardiomegaly is stable. Left internal jugular chest port is again seen with its tip within the superior vena cava. No acute bone abnormality. IMPRESSION: Interval development of mild cardiogenic failure. Stable moderate cardiomegaly. Electronically Signed   By: Fidela Salisbury MD   On: 06/21/2021 01:53   DG Chest Port 1 View  Result Date: 06/17/2021 CLINICAL DATA:  Nausea EXAM: PORTABLE CHEST 1 VIEW COMPARISON:  05/14/2021 FINDINGS: Cardiac shadow is enlarged but stable. Left chest wall port is again seen. Lungs are well aerated bilaterally. No focal infiltrate or sizable effusion is seen. No acute bony abnormality is noted. IMPRESSION: Stable cardiomegaly.  No focal infiltrate is seen. Electronically Signed   By: Inez Catalina M.D.   On: 06/17/2021 20:27   [place] EKG SITE RITE  Result Date: 06/21/2021 If Site Rite image not attached,  placement could not be confirmed due to current cardiac rhythm.  US Abdomen Limited RUQ (LIVER/GB)  Result Date: 06/17/2021 CLINICAL DATA:  Right upper quadrant pain EXAM: ULTRASOUND ABDOMEN LIMITED RIGHT UPPER QUADRANT COMPARISON:  CT 06/16/2021 FINDINGS: Gallbladder: No shadowing stones. Increased gallbladder wall thickness up to 6 mm. Negative sonographic Murphy. Common bile duct: Diameter: 3.1 mm Liver: Echogenicity within normal limits. Shadowing calcification within the right hepatic lobe. Portal vein is patent on color Doppler imaging with normal direction of blood flow towards the liver. Other: None. IMPRESSION: 1. Gallbladder wall thickening without shadowing stone or sonographic Murphy. Findings are nonspecific. If acute gallbladder disease remains a concern, further evaluation with hepatobiliary nuclear medicine imaging could be obtained Electronically Signed   By: Donavan Foil M.D.   On: 06/17/2021 20:09    Microbiology: Recent Results (from the past 240 hour(s))  Blood Culture (routine x 2)     Status: None   Collection Time: 06/17/21  6:36 PM   Specimen: BLOOD  Result Value Ref Range Status   Specimen Description BLOOD RIGHT ANTECUBITAL  Final   Special Requests   Final    BOTTLES DRAWN AEROBIC ONLY Blood Culture results may not be optimal due to an inadequate volume of blood received in culture bottles   Culture   Final    NO GROWTH 5 DAYS Performed at [facility], [place] [address] [place], [place] [address]    Report Status 06/22/2021 FINAL  Final  Blood Culture (routine x 2)     Status: None   Collection Time: 06/17/21  7:30 PM   Specimen: BLOOD  Result Value Ref Range Status   Specimen Description BLOOD BLOOD RIGHT FOREARM  Final  Special Requests   Final    BOTTLES DRAWN AEROBIC AND ANAEROBIC Blood Culture results may not be optimal due to an inadequate volume of blood received in culture bottles   Culture   Final    NO GROWTH 5 DAYS Performed at [facility], [place] [address] [place], [place] [address]    Report Status 06/22/2021 FINAL  Final  Resp Panel by RT-PCR (Flu A&B, Covid) Nasopharyngeal Swab     Status: None   Collection Time: 06/17/21  7:30 PM   Specimen: Nasopharyngeal Swab; Nasopharyngeal(NP) swabs in vial transport medium  Result Value Ref Range Status   SARS Coronavirus 2 by RT PCR NEGATIVE NEGATIVE Final    Comment: (NOTE) SARS-CoV-2 target nucleic acids are NOT DETECTED.  The SARS-CoV-2 RNA is generally detectable in upper respiratory specimens during the acute phase of infection. The lowest concentration of SARS-CoV-2 viral copies this assay can detect is 138 copies/mL. A negative result does not preclude SARS-Cov-2 infection and should not be used as the sole basis for treatment or other patient management decisions. A negative result may occur with  improper specimen collection/handling, submission of specimen other than nasopharyngeal swab, presence of viral mutation(s) within the areas targeted by this assay, and inadequate number of viral copies(<138 copies/mL). A negative result must be combined with clinical observations, patient history, and epidemiological information. The expected result is Negative.  Fact Sheet for Patients:  EntrepreneurPulse.com.au  Fact Sheet for Healthcare Providers:  IncredibleEmployment.be  This test is no t yet approved or cleared by the [place] FDA and  has been authorized for detection and/or diagnosis of SARS-CoV-2 by FDA under an Emergency Use Authorization (EUA). This EUA will remain  in effect (meaning this test can be used) for the duration of the COVID-19 declaration under Section 564(b)(1) of the Act, 21 U.S.C.section 360bbb-3(b)(1), unless the authorization is terminated  or revoked sooner.       Influenza A by PCR NEGATIVE NEGATIVE Final   Influenza B by PCR NEGATIVE NEGATIVE Final    Comment: (NOTE) The Xpert Xpress  SARS-CoV-2/FLU/RSV plus assay is intended as an aid in the diagnosis of influenza from Nasopharyngeal swab specimens and should not be used as a sole basis for treatment. Nasal washings and aspirates are unacceptable for Xpert Xpress SARS-CoV-2/FLU/RSV testing.  Fact Sheet for Patients: EntrepreneurPulse.com.au  Fact Sheet for Healthcare Providers: IncredibleEmployment.be  This test is not yet approved or cleared by the [place] FDA and has been authorized for detection and/or diagnosis of SARS-CoV-2 by FDA under an Emergency Use Authorization (EUA). This EUA will remain in effect (meaning this test can be used) for the duration of the COVID-19 declaration under Section 564(b)(1) of the Act, 21 U.S.C. section 360bbb-3(b)(1), unless the authorization is terminated or revoked.  Performed at [facility], [place] [address], [place], [place] [address]      Labs: Basic Metabolic Panel: Recent Labs  Lab 06/18/21 0100 06/19/21 0140 06/20/21 0426 06/21/21 0152 06/22/21 0733 06/23/21 0207  NA 131* 128* 127* 129* 135 132*  K 5.8* 6.1* 5.0 4.7 4.2 3.6  CL 97* 92* 95* 96* 101 95*  CO2 21* 23 22 21* 23 27  GLUCOSE 157* 146* 103* 129* 142* 174*  BUN 66* 83* 75* 67* 43* 32*  CREATININE 2.70* 3.42* 2.82* 2.44* 1.87* 1.72*  CALCIUM 8.5* 8.8* 8.0* 8.3* 8.8* 8.4*  MG 2.3  --   --   --   --   --  Liver Function Tests: Recent Labs  Lab 06/19/21 0140 06/20/21 0426 06/21/21 0152 06/22/21 0733 06/23/21 0207  AST 155* 118* 99* 68* 57*  ALT 122* 122* 111* 83* 76*  ALKPHOS 142* 145* 143* 128* 117  BILITOT 3.4* 2.1* 1.9* 1.8* 1.2  PROT 6.2* 6.5 6.7 6.0* 6.3*  ALBUMIN 2.7* 2.9* 3.0* 2.5* 2.5*   Recent Labs  Lab 06/17/21 1836 06/19/21 0140  LIPASE 52* 45   No results for input(s): AMMONIA in the last 168 hours. CBC: Recent Labs  Lab 06/17/21 1836 06/18/21 0543 06/19/21 0140 06/20/21 0426 06/21/21 0152  WBC 8.4 11.5* 12.4* 10.1  8.6  NEUTROABS 6.0 8.1*  --   --   --   HGB 12.7* 13.1 11.1* 12.0* 11.9*  HCT 38.7* 40.6 32.9* 36.1* 36.3*  MCV 79.8* 82.0 78.1* 78.6* 79.8*  PLT 336 339 308 314 297   Cardiac Enzymes: No results for input(s): CKTOTAL, CKMB, CKMBINDEX, TROPONINI in the last 168 hours. BNP: BNP (last 3 results) Recent Labs    11/12/20 1054 05/13/21 1611 06/21/21 0152  BNP 970.2* 1,976.6* 2,176.6*    ProBNP (last 3 results) No results for input(s): PROBNP in the last 8760 hours.  CBG: No results for input(s): GLUCAP in the last 168 hours.     Signed:  Cristal Ford  [facility] 06/23/2021, 12:41 PM

## 2021-06-23 NOTE — Telephone Encounter (Signed)
Client being discharged states he has food  for today. Nurse made contact with his sister she will check on food and drink . Nurse will arrange transportation for appt on Wed and has [place] visit on friday

## 2021-06-23 NOTE — Telephone Encounter (Signed)
Client to go to ED ,sister text ed me later stating client was admitted to hospital for test Will follow up

## 2021-06-23 NOTE — Congregational Nurse Program (Signed)
  Dept: [place] Nurse Program Note  Date of Encounter: 06/23/2021  Past Medical History: Past Medical History:  Diagnosis Date   CAD in native artery 07/17/2014   STEMI- LAD stenosis with DES   CHF (congestive heart failure) (HCC)    Cocaine abuse (HCC)    COPD (chronic obstructive pulmonary disease) ([place])    Diabetes mellitus without complication ([place])    Dyspnea    Gastric cancer ([place]) 11/01/2020   GERD (gastroesophageal reflux disease)    Hypertension    ST elevation myocardial infarction (STEMI) involving left anterior descending (LAD) coronary artery with complication ([place])    Tobacco abuse 07/17/2014    Encounter Details:  CNP Questionnaire - 06/19/21 1400       Questionnaire   Do you give verbal consent to treat you today? Yes    Visit Setting Other    Location Patient Served At [facility] patient    Patient Status Homeless    Medical Provider Yes    Insurance Medicaid    Intervention Counsel;Educate;Support    Housing/Utilities No permanent housing    Transportation Need transportation assistance;Within past 12 months, lack of transportation negatively impacted life    Food Have food insecurities;Within past 12 months, worried food would run out with no money to buy more;Within past 12 months, food ran out with no money to buy more    Referrals PCP - [facility] visit to check on client ! In bed states he isn't sure what is going on need to talk with his nurse. Spoke with assigned nurse and she states more test will be done not sure of discharge date but will keep nurse in community updated ,explained my role as his [facility] nurse.Nurse had extended clients stay in [place] program and assured client he would not be put out unless his plans for care changed but would always notify him in advance.  Follow closely

## 2021-06-23 NOTE — Telephone Encounter (Signed)
Client wasn't sure but thought he might be discharged on Sunday. States he is okay

## 2021-06-23 NOTE — Telephone Encounter (Signed)
Client needing transportation to Med center at [facility] [employer] [employer] [facility]. Nurse will arrange

## 2021-06-23 NOTE — Telephone Encounter (Signed)
Making plans for discharge over week end . Will notify [facility] nurse of discharge

## 2021-06-23 NOTE — Telephone Encounter (Signed)
After several call client is being discharged today and hospital will arrange transport back to  hotel ,hospital needing to verify room still available ,explained [facility] and housing situation .Client has several appointments this week transportation to be arranged by nurse and will check with his sister to assure he has food and drink for the week .

## 2021-06-23 NOTE — Telephone Encounter (Signed)
Called client 06-16-2021 to see if he was seen today he states yes

## 2021-06-24 ENCOUNTER — Telehealth: Payer: Self-pay | Admitting: Pharmacist

## 2021-06-24 ENCOUNTER — Encounter: Payer: Self-pay | Admitting: Oncology

## 2021-06-24 ENCOUNTER — Telehealth: Payer: Self-pay | Admitting: Oncology

## 2021-06-24 ENCOUNTER — Telehealth: Payer: Self-pay | Admitting: Pharmacy Technician

## 2021-06-24 ENCOUNTER — Telehealth: Payer: Self-pay

## 2021-06-24 ENCOUNTER — Other Ambulatory Visit (HOSPITAL_COMMUNITY): Payer: Self-pay

## 2021-06-24 ENCOUNTER — Ambulatory Visit: Payer: Medicaid Other

## 2021-06-24 ENCOUNTER — Other Ambulatory Visit: Payer: Self-pay | Admitting: *Deleted

## 2021-06-24 DIAGNOSIS — C163 Malignant neoplasm of pyloric antrum: Secondary | ICD-10-CM

## 2021-06-24 LAB — HCV RNA DIAGNOSIS, NAA: HCV RNA, Quantitation: NOT DETECTED IU/mL

## 2021-06-24 LAB — HEPATITIS C VRS RNA DETECT BY PCR-QUAL

## 2021-06-24 MED ORDER — CAPECITABINE 500 MG PO TABS
1500.0000 mg | ORAL_TABLET | Freq: Two times a day (BID) | ORAL | 0 refills | Status: DC
  Filled 2021-06-24: qty 168, 28d supply, fill #0
  Filled 2021-06-25: qty 120, 28d supply, fill #0

## 2021-06-24 NOTE — Telephone Encounter (Signed)
Transition Care Management Unsuccessful Follow-up Telephone Call  Date of discharge and from where:  06/23/2021, [facility]   Attempts:  1st Attempt  Reason for unsuccessful TCM follow-up call:  Left voice message on # [phone]

## 2021-06-24 NOTE — Progress Notes (Signed)
MD requesting lab/OV on 7/8 or 7/7. Scheduling message sent and notified oral chemotherapy pharmacist that xeloda script sent to [facility] w/ requested start date of 6/30.

## 2021-06-24 NOTE — Telephone Encounter (Signed)
Spoke with SW [facility] regarding upcoming appointment for client on Friday.transportation has been arranged.She will speak with client on Friday. Reviewed his upcoming appointments ,verified current address. Follow up as needed

## 2021-06-24 NOTE — Telephone Encounter (Signed)
Tc to [facility] to arrange transport for radiation treatment on 06-27-2021 at 1:15 pm  Arranged . Nurse also arranged for transport for the first week in July. Client has multiple appointments and has admitted he gets confused . Text sent to client with all appointments for this week and pick up times . Client responded ok

## 2021-06-24 NOTE — Progress Notes (Signed)
[place] CSW Progress Notes  Call to [name], [place] Nurse.  Transportation is set for appointments at [facility] through 7/8 per RN. She is making his transport arrangements as he has been confused by the number of appointments he has w various providers.  Staff will need to verify w patient that he has contacted [facility] for all upcoming radiation appointments after July 8.  He is currently staying at [employer], [place], [place] through [place] intention was for patient to transition to [place] for longer term transitional housing; however, this building is currently closed for 8 weeks for renovations.  CSW will attempt to see patient tomorrow 6/29 to further assess and provide any available resources.  Edwyna Shell, LCSW Clinical Social Worker Phone:  [phone]

## 2021-06-24 NOTE — Telephone Encounter (Signed)
Scheduled appt per 6/28 sch msg - left message for patient with appt date and time

## 2021-06-24 NOTE — Telephone Encounter (Signed)
Oral Oncology Pharmacist Encounter  Received new prescription for Xeloda (capecitabine) for the treatment of gastric cancer in conjunction with XRT, planned duration until the end of XRT. Plan start 6/29 along with his first XRT appt.  CMP from 06/23/21 assessed, SCr continues to trend down CrCl ~57mL/min. No renal dose adjustment needed at this time, continue to monitor. Prescription dose and frequency assessed.   Current medication list in Epic reviewed, one DDIs with capecitabine identified: - Pantoprazole: Proton Pump Inhibitors (PPI) may diminish the therapeutic effect of capecitabine, varying information on the clinical impact. Recommend evaluating the need for a PPI/acid suppression. If acid suppression is needed, attempt switching to a H2 antagonist (eg, famotidine) if possible.  Evaluated chart and several patient barriers to medication adherence identified. Patient was previously unhoused, but has been plugged in with resources to help with this. In the past he was also lost to follow-up. Social Work is actively involved in his care. Will continue to evaluate medication adherence barriers for Mr. Bernard.   Prescription has been e-scribed to the [facility] for benefits analysis and approval.  Oral Oncology Clinic will continue to follow for insurance authorization, copayment issues, initial counseling and start date.   Darl Pikes, PharmD, BCPS, BCOP, CPP Hematology/Oncology Clinical Pharmacist Practitioner [facility]/HP/AP Oral [facility] [phone]  06/24/2021 11:19 AM

## 2021-06-25 ENCOUNTER — Other Ambulatory Visit: Payer: Self-pay

## 2021-06-25 ENCOUNTER — Inpatient Hospital Stay: Payer: Medicaid Other | Admitting: General Practice

## 2021-06-25 ENCOUNTER — Telehealth: Payer: Self-pay

## 2021-06-25 ENCOUNTER — Other Ambulatory Visit (HOSPITAL_COMMUNITY): Payer: Self-pay

## 2021-06-25 ENCOUNTER — Ambulatory Visit
Admission: RE | Admit: 2021-06-25 | Discharge: 2021-06-25 | Disposition: A | Discharge status: Home / Self Care | Payer: Medicaid Other | Source: Ambulatory Visit | Attending: Radiation Oncology | Admitting: Radiation Oncology

## 2021-06-25 ENCOUNTER — Encounter: Payer: Self-pay | Admitting: Oncology

## 2021-06-25 DIAGNOSIS — Z510 Encounter for antineoplastic radiation therapy: Secondary | ICD-10-CM | POA: Diagnosis not present

## 2021-06-25 DIAGNOSIS — C163 Malignant neoplasm of pyloric antrum: Secondary | ICD-10-CM

## 2021-06-25 NOTE — Telephone Encounter (Signed)
TC to client after treatment to check on client ,states things went well ,saw SW ,got some food but needs something to drink besides water. Nurse will get some juices and drinks for client and deliver.  Reviewed appointments and times with client for the rest of the week

## 2021-06-25 NOTE — Telephone Encounter (Signed)
Client called informed him of change in app for 06-26-21.Client needs to get to pharmacy. Nurse will call transport to arrange before treatment today

## 2021-06-25 NOTE — Telephone Encounter (Signed)
Oral Chemotherapy Pharmacist Encounter  Patient advocate [name] organized for Xeloda to be delivered to [name] while onsite at [place] today for his first radiation appt. He will take his first dose this evening.  Patient Education I spoke with patient for overview of new oral chemotherapy medication:Xeloda (capecitabine) for the treatment of gastric cancer in conjunction with XRT, planned duration until the end of XRT. Plan start 6/29 along with his first XRT appt.   Pt is doing well. Counseled patient on administration, dosing, side effects, monitoring, drug-food interactions, safe handling, storage, and disposal. Patient will take 3 tablets (1,500 mg total) by mouth 2 (two) times daily after a meal. Take Monday-Friday. Take only on days of radiation.  Side effects include but not limited to: diarrhea, hand-foot syndrome, edema, decreased wbc, fatigue, N/V.    Reviewed with patient importance of keeping a medication schedule and plan for any missed doses.  [name] voiced understanding. All questions answered.   Provided patient with Oral [facility] phone number. Patient knows to call the office with questions or concerns. Oral Chemotherapy Navigation Clinic will continue to follow.  Darl Pikes, PharmD, BCPS, BCOP, CPP Hematology/Oncology Clinical Pharmacist Practitioner [facility]/HP/AP Oral [facility] [phone]  06/25/2021 12:17 PM

## 2021-06-25 NOTE — Progress Notes (Signed)
[place] CSW Progress Notes  Met briefly with patient prior to his first radiation treatment.  He voices significant exhaustion, is SOB, "I just got out of the hospital, I dont know if I can do all this right now."  He is living in hotel under auspices of [employer], with hopes of entering [facility].  Given his physical debility, he may not be ambulatory enough to enter [facility] when it reopens after mold remediation (approx 8 weeks from now).  His [facility] Nurse has voiced concern over his suitability for a program like [facility] where residents must be completely independent in all ADLs.  Per patient "I pretty much just lay in my bed all day except for when I have to go to doctors appointments."  He is somewhat frustrated at having to begin daily radiation treatments today as he was just released from the hospital and states he is experiencing significant fatigue.  "I don't know whether I am going to be able to do this."  He is using a wheelchair for ambulation in the cancer center - it is unclear how he ambulates at his hotel or what DME he has in place.    He is open to placement in assisted living or family care home - it is unclear whether there is a facility which would be able to accept him.  CSW will investigate options open to patient.  Messages sent to Heart Failure CSW Bridgette Habermann as well as Congregational Nurse Teresita Madura.  Edwyna Shell, LCSW Clinical Social Worker Phone:  [phone]

## 2021-06-25 NOTE — Telephone Encounter (Signed)
Transition Care Management Unsuccessful Follow-up Telephone Call  Date of discharge and from where:  06/23/2021, [facility]   Attempts:  2nd Attempt  Reason for unsuccessful TCM follow-up call:  Left voice message on # [phone].call back requested to this CM.  Call placed to [name], RN/CNP  regarding patient's scheduled appointment with Freeman Caldron, PA @ [facility] tomorrow - 06/26/2021 @ 1330. Patient also has an appointment with oncology at 1345 tomorrow.  Mariann Laster has been helping the patient manage his appointments and schedule transportation.  The appointment at [facility] was then cancelled due to the conflict.  Patient has follow up with Juluis Mire, NP @ [facility] 07/24/2021.

## 2021-06-25 NOTE — Telephone Encounter (Signed)
TC from [place] spoke with Opal Sidles ,RN client has appointment at [facility] same  time as radiation app at [employer] [facility]. [facility] nurse will reschedule transport appt . And will keep 07-24-2021 app at [place] 8:30 am,will let client know of changes

## 2021-06-25 NOTE — Telephone Encounter (Signed)
Oral Oncology Patient Advocate Encounter  After completing a benefits investigation, prior authorization for Xeloda is not required at this time through [facility].  Patient's copay is pending processing. $3 or $0 is expected copay.    [place] Patient [place] Phone [phone] Fax [phone]

## 2021-06-26 ENCOUNTER — Telehealth: Payer: Self-pay

## 2021-06-26 ENCOUNTER — Encounter: Payer: Self-pay | Admitting: General Practice

## 2021-06-26 ENCOUNTER — Ambulatory Visit
Admission: RE | Admit: 2021-06-26 | Discharge: 2021-06-26 | Disposition: A | Discharge status: Home / Self Care | Payer: Medicaid Other | Source: Ambulatory Visit | Attending: Radiation Oncology | Admitting: Radiation Oncology

## 2021-06-26 ENCOUNTER — Inpatient Hospital Stay: Payer: Medicaid Other | Admitting: Physician Assistant

## 2021-06-26 ENCOUNTER — Telehealth: Payer: Self-pay | Admitting: General Practice

## 2021-06-26 DIAGNOSIS — Z510 Encounter for antineoplastic radiation therapy: Secondary | ICD-10-CM | POA: Diagnosis not present

## 2021-06-26 DIAGNOSIS — C163 Malignant neoplasm of pyloric antrum: Secondary | ICD-10-CM | POA: Diagnosis not present

## 2021-06-26 NOTE — Telephone Encounter (Signed)
Called and spoke to patient on 6/29 about getting Xeloda.  Due to transportation issues he didn't know if he would be able to go by and pick it up.  Tyler Wong, [facility] patient advocate, picked up his medication from [facility] and delivered it to him before his radiation appointment.   [place] Patient [place] Phone [phone] Fax [phone] 06/26/2021 12:11 PM

## 2021-06-26 NOTE — Telephone Encounter (Signed)
[place] CSW Progress Notes  Call to [name], RN, [facility] Congregational RN.  Let her know that I will look into ALF/FCH placement for patient given his physical condition.  FL:2 requested from oncologist.  Will also ask dietitian about possibility of nutritional supplements as patient states he has nothing to drink other than water.  Informed her that patient told me he did not get all his medications at the pharmacy yesterday - also let her know that Xeloda was handed to him by [place] staff yesterday in radiation waiting room.  CSW checked on the following facilities:  - [facility] - no male beds - [facility] - has bed, requests FL2/H and P/med list be faxed to them at [phone] for review - [facility] - message left for [place]/admissions.  Awaiting return call. - [facility] - LVM for admissions.  Edwyna Shell, LCSW Clinical Social Worker Phone:  [phone]

## 2021-06-26 NOTE — Progress Notes (Signed)
[place] CSW Progress Notes  Call from [facility] housing team member Mendel Ryder [place]).  Reviewed patient's needs - his managed Medicaid does offer emergency and transitional housing assistance.  Emergency assistance consists of limited funds for utilities and similar.  Transitional assistance may offer payment for security deposit and first month's rent upon submission of appropriate documentation of new residence.  As patient is currently housed in a hotel, neither of these options are helpful at present.  Patient has been assigned a case manager - Quentin Cornwall [phone]; [email or web address]).  Spoke w CM, explained that patient may need ALF or [place] placement and also needs resources for food during his hotel stay as he is unable to get out to store.  She will investigate available resources through his Medicaid and advise re what they are able to offer.  Edwyna Shell, LCSW Clinical Social Worker Phone:  [phone]

## 2021-06-26 NOTE — Telephone Encounter (Signed)
Transition Care Management Follow-up Telephone Call Date of discharge and from where: 06/23/2021, [facility]  How have you been since you were released from the hospital? He said he is doing okay. Hoping to get some rest before his radiation treatment today.  Any questions or concerns? No  Items Reviewed: Did the pt receive and understand the discharge instructions provided? Yes  Medications obtained and verified? Yes  - he said that he has all of his medications.  Other? No  Any new allergies since your discharge? No  Do you have support at home?  He is currently staying at [facility] and is a member of the [facility] program.  [name], RN/Congregational Nurse Program has been working with the patient.  Edwyna Shell, [place] is starting to look for ALF for him.   Home Care and Equipment/Supplies: Were home health services ordered? no If so, what is the name of the agency? N/a  Has the agency set up a time to come to the patient's home? not applicable Were any new equipment or medical supplies ordered?  No What is the name of the medical supply agency? N/a Were you able to get the supplies/equipment? not applicable Do you have any questions related to the use of the equipment or supplies? No  Functional Questionnaire: (I = Independent and D = Dependent) ADLs: independent   Follow up appointments reviewed:  PCP Hospital f/u appt confirmed? Yes  Scheduled to see Juluis Mire, NP on 07/24/2021  Specialist Hospital f/u appt confirmed? Yes  - He has radiation appointments scheduled daily starting today.  He has an appointment with cardiology - 06/27/2021.  Are transportation arrangements needed?  [name], RN has been assisting with arranging transportation.  If their condition worsens, is the pt aware to call PCP or go to the Emergency Dept.? Yes Was the patient provided with contact information for the PCP's office or ED? Yes Was to pt encouraged to call back  with questions or concerns? Yes

## 2021-06-27 ENCOUNTER — Encounter (HOSPITAL_COMMUNITY): Payer: Self-pay

## 2021-06-27 ENCOUNTER — Ambulatory Visit (HOSPITAL_COMMUNITY)
Admission: RE | Admit: 2021-06-27 | Discharge: 2021-06-27 | Disposition: A | Discharge status: Home / Self Care | Payer: Medicaid Other | Source: Ambulatory Visit | Attending: Family Medicine | Admitting: Family Medicine

## 2021-06-27 ENCOUNTER — Ambulatory Visit
Admission: RE | Admit: 2021-06-27 | Discharge: 2021-06-27 | Disposition: A | Discharge status: Home / Self Care | Payer: Medicaid Other | Source: Ambulatory Visit | Attending: Radiation Oncology | Admitting: Radiation Oncology

## 2021-06-27 ENCOUNTER — Other Ambulatory Visit: Payer: Self-pay

## 2021-06-27 ENCOUNTER — Encounter: Payer: Self-pay | Admitting: General Practice

## 2021-06-27 VITALS — HR 99 | Wt 166.0 lb

## 2021-06-27 DIAGNOSIS — C169 Malignant neoplasm of stomach, unspecified: Secondary | ICD-10-CM

## 2021-06-27 DIAGNOSIS — I5022 Chronic systolic (congestive) heart failure: Secondary | ICD-10-CM | POA: Diagnosis not present

## 2021-06-27 DIAGNOSIS — N183 Chronic kidney disease, stage 3 unspecified: Secondary | ICD-10-CM | POA: Diagnosis not present

## 2021-06-27 DIAGNOSIS — I5084 End stage heart failure: Secondary | ICD-10-CM | POA: Diagnosis not present

## 2021-06-27 DIAGNOSIS — I340 Nonrheumatic mitral (valve) insufficiency: Secondary | ICD-10-CM | POA: Insufficient documentation

## 2021-06-27 DIAGNOSIS — F1410 Cocaine abuse, uncomplicated: Secondary | ICD-10-CM | POA: Diagnosis not present

## 2021-06-27 DIAGNOSIS — N1832 Chronic kidney disease, stage 3b: Secondary | ICD-10-CM | POA: Diagnosis not present

## 2021-06-27 DIAGNOSIS — Z8616 Personal history of COVID-19: Secondary | ICD-10-CM | POA: Insufficient documentation

## 2021-06-27 DIAGNOSIS — F17210 Nicotine dependence, cigarettes, uncomplicated: Secondary | ICD-10-CM | POA: Diagnosis not present

## 2021-06-27 DIAGNOSIS — Z79899 Other long term (current) drug therapy: Secondary | ICD-10-CM | POA: Diagnosis not present

## 2021-06-27 DIAGNOSIS — J449 Chronic obstructive pulmonary disease, unspecified: Secondary | ICD-10-CM | POA: Insufficient documentation

## 2021-06-27 DIAGNOSIS — Z7982 Long term (current) use of aspirin: Secondary | ICD-10-CM | POA: Diagnosis not present

## 2021-06-27 DIAGNOSIS — Z8249 Family history of ischemic heart disease and other diseases of the circulatory system: Secondary | ICD-10-CM | POA: Diagnosis not present

## 2021-06-27 DIAGNOSIS — I2510 Atherosclerotic heart disease of native coronary artery without angina pectoris: Secondary | ICD-10-CM | POA: Insufficient documentation

## 2021-06-27 DIAGNOSIS — I255 Ischemic cardiomyopathy: Secondary | ICD-10-CM | POA: Insufficient documentation

## 2021-06-27 DIAGNOSIS — R110 Nausea: Secondary | ICD-10-CM | POA: Diagnosis not present

## 2021-06-27 DIAGNOSIS — I252 Old myocardial infarction: Secondary | ICD-10-CM | POA: Insufficient documentation

## 2021-06-27 DIAGNOSIS — E785 Hyperlipidemia, unspecified: Secondary | ICD-10-CM | POA: Insufficient documentation

## 2021-06-27 DIAGNOSIS — C163 Malignant neoplasm of pyloric antrum: Secondary | ICD-10-CM

## 2021-06-27 DIAGNOSIS — Z955 Presence of coronary angioplasty implant and graft: Secondary | ICD-10-CM | POA: Diagnosis not present

## 2021-06-27 DIAGNOSIS — R200 Anesthesia of skin: Secondary | ICD-10-CM | POA: Diagnosis not present

## 2021-06-27 DIAGNOSIS — Z9861 Coronary angioplasty status: Secondary | ICD-10-CM

## 2021-06-27 DIAGNOSIS — N189 Chronic kidney disease, unspecified: Secondary | ICD-10-CM | POA: Diagnosis not present

## 2021-06-27 DIAGNOSIS — K219 Gastro-esophageal reflux disease without esophagitis: Secondary | ICD-10-CM | POA: Diagnosis not present

## 2021-06-27 DIAGNOSIS — Z510 Encounter for antineoplastic radiation therapy: Secondary | ICD-10-CM | POA: Diagnosis not present

## 2021-06-27 LAB — BASIC METABOLIC PANEL
Anion gap: 10 (ref 5–15)
BUN: 47 mg/dL — ABNORMAL HIGH (ref 6–20)
CO2: 27 mmol/L (ref 22–32)
Calcium: 8.5 mg/dL — ABNORMAL LOW (ref 8.9–10.3)
Chloride: 98 mmol/L (ref 98–111)
Creatinine, Ser: 1.92 mg/dL — ABNORMAL HIGH (ref 0.61–1.24)
GFR, Estimated: 41 mL/min — ABNORMAL LOW (ref >60)
Glucose, Bld: 112 mg/dL — ABNORMAL HIGH (ref 70–99)
Potassium: 4.1 mmol/L (ref 3.5–5.1)
Sodium: 135 mmol/L (ref 135–145)

## 2021-06-27 LAB — BRAIN NATRIURETIC PEPTIDE: B Natriuretic Peptide: 1406.5 pg/mL — ABNORMAL HIGH (ref 0.0–100.0)

## 2021-06-27 MED ORDER — TORSEMIDE 40 MG PO TABS: 80.0000 mg | ORAL_TABLET | Freq: Two times a day (BID) | ORAL | 3 refills | Status: DC

## 2021-06-27 MED ORDER — SONAFINE EX EMUL
1.0000 "application " | Freq: Once | CUTANEOUS | Status: AC
  Administered 2021-06-27: 1 via TOPICAL

## 2021-06-27 NOTE — Progress Notes (Signed)
ReDS Vest / Clip - 06/27/21 0900       ReDS Vest / Clip   Station Marker C    Ruler Value 25.5    ReDS Value Range Moderate volume overload    ReDS Actual Value 37

## 2021-06-27 NOTE — Progress Notes (Signed)
[place] CSW Progress Notes  CSW heard from Hydia at [facility], they have done a preliminary review and think they may be able to offer patient a bed at their facility.  His case must also be reviewed by others in administration - if approval is granted, he could move in as soon as midweek July 6th or later.  Facility would like patient to receive [facility] transport to his daily radiation appointments. Spoke to patient w an update, explained that he has possibility of placement, that he would most likely have a roommate under the terms of Medicaid placements, and that he would no longer receive his full disability check as all his needs are being met through placement in facility.  Hydia will call CSW team next week after administrators have reviewed his paperwork, she will call CSW team at [phone] to let us know final decision.  Undersigned CSW will be on PAL until 7/12, [facility] CSW team will follow and assist as needed.  VM left for Congregational RN [name], HF CSW Bridgette Habermann messaged w this update.  Edwyna Shell, LCSW Clinical Social Worker Phone:  [phone]

## 2021-06-27 NOTE — Progress Notes (Signed)
Advanced Heart Failure Clinic Note   PCP: [name], NP AHFC: Dr. Aundra Dubin  Oncology: Dr Benay Spice   Reason for Visit:  Heart Failure  HPI: 55 y.o. 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 Also w/ h/o poor compliance. Diagnosed w/ COVID 09/19/20.   Admitted 10/21 w/ acute on chronic systolic heart failure w/ low output and AKI requiring milrinone to help w/ diuresis. He was cocaine positive. Diuresed and weaned off milrinone w/ stable co-ox at 65%. Scr had improved from 2.1>>1.7. D/c wt 159 lb. Unfortunately, his wife was also hospitalized for COVID and died while he was admitted.   Readmitted again 11/21 with marked volume overload and low output heart failure, cocaine+ again. He complained of abdominal pain, initially suspected to be 2/2 low output HF but CT showed a mass-like area in the antral pre-pyloric region of the stomach. EGD with complicated distal stomach ulcers. Pathology confirmed gastric cancer, intramucosal adenocarcinoma. Seen by oncology and not felt to be a candidate for surgery given other comorbidities.   He follows with Dr. Benay Spice and has been getting FOLFOX chemotherapy. Had porta cath placed 10/04/20.   Admitted 05/9677 with A/C systolic heart failure + AKI. + cocaine on UDS. Had low output heart failure and was placed on milrinone and diuresed with IV lasix. Once diuresed he was transitioned to torsemide 80 mg daily. Bidil, entresto, digoxin, and spirononlactone discontinued and not restarted due to AKI.   He returned to clinic 06/11/21 for post hospital follow up with his sister. Complaining of abdominal discomfort and nausea. Says he has ongoing issues with abdominal bloating. Overall feeling fine. SOB with exertion. Denies /PND/Orthopnea.  Appetite ok. No fever or chills. He has not been weighing because he has not scale. Taking all medications and has  been taking digoxin. . Lives in a motel. He is on the list for low income housing.   He was admitted 06/17/21 from Oncology office for concerns for cholecystitis. In the ED, CT abdomen pelvis showed acute acalculous cholecystitis, however HIDA scan negative.  EGD showed large amount of retained food. Hospitalization c/b AKI, hypoxic respiratory failure 2/2 to [name]. He was diuresed with IV lasix 160 mg bid. UDS + for benzos/cocaine this admission. Palliative care consulted, he wanted to remain full code. His discharge weight was 165 lbs.   Today he returns for post-hospitalization HF follow up. Overall feeling fine. SOB with stairs. Says his leg swelling is much better. Denies increasing SOB, CP, dizziness,  or PND/Orthopnea. Appetite ok. No fever or chills. Weight at home 170 pounds. Taking all medications. He has been taking torsemide 80 mg bid. Discharge instructions instruct 80 mg daily. Unclear if he is on ivabradine currently. Used cocaine couple days ago. Living in hotel currently, plans to transition to [employer] when available.  Labs (1/22): K 3.3, creatinine 1.29, hgb 13.8 Labs (2/22): K 3.8, creatinine 1.37, hgb 13.0 Labs (05/13/21): Creatinine 2.2  Labs (05/21/21): Creatinine 1.7  Labs (6/22): K 3.6, creatinine 1.92  Review of systems complete and found to be negative unless listed in HPI.    PMH: 1. CAD: STEMI 2015 with DES to LAD.  - Cath in 10/19 showed 90% ostial OM1 and 70% PLV, no interventional target. 2. Hyperlipidemia 3. Cocaine abuse.  4. COPD:  Ongoing smoking.  5. GERD 6. Gastric cancer: Intramucosal adenocarcinoma.  - FOLFOX chemotherapy 7. Chronic systolic CHF:  Mixed ischemic/nonischemic (cocaine) cardiomyopathy.  Thought to be end stage, has had low output requiring milrinone in the hospital but not candidate for LVAD, transplant, or home  milrinone given ongoing cocaine abuse.  - Echo (10/21): EF 20-25%, global HK with moderate dilation, mild LVH, RV low normal systolic function, PASP 57 mmHg, moderate-severe MR, moderate-severe TR.  8. COVID-19 infection: 9/21.   Current Outpatient Medications  Medication Instructions   acidophilus (RISAQUAD) CAPS capsule 1 capsule, Oral, Daily   albuterol (VENTOLIN HFA) 108 (90 Base) MCG/ACT inhaler 2 puffs, Inhalation, Every 4 hours PRN   ASPIRIN LOW DOSE 81 MG EC tablet TAKE 1 TABLET (81 MG TOTAL) BY MOUTH DAILY (MORNING)   atorvastatin (LIPITOR) 80 mg, Oral, Daily   capecitabine (XELODA) 1,500 mg, Oral, 2 times daily after meals, Take Monday-Friday. Take only on days of radiation.   empagliflozin (JARDIANCE) 10 mg, Oral, Daily   fluconazole (DIFLUCAN) 200 mg, Oral, Daily at bedtime   gabapentin (NEURONTIN) 100 mg, Oral, 2 times daily   metoCLOPramide (REGLAN) 5 mg, Oral, Every 8 hours PRN   Multiple Vitamins-Minerals (MULTIVITAMIN WITH MINERALS) tablet 1 tablet, Oral, Daily   nicotine (NICODERM CQ - DOSED IN MG/24 HR) 7 mg, Transdermal, Daily   pantoprazole (PROTONIX) 40 mg, Oral, 2 times daily   potassium chloride SA (KLOR-CON) 20 MEQ tablet 40 mEq, Oral, Daily   Torsemide 80 mg, Oral, Daily    No current facility-administered medications for this encounter.   No Known Allergies  Social History   Socioeconomic History   Marital status: Single    Spouse name: Not on file   Number of children: 2   Years of education: 62   Highest education level: 12th grade  Occupational History   Occupation: Landscaping  Tobacco Use   Smoking status: Every Day    Packs/day: 0.25    Years: 30.00    Pack years: 7.50    Types: Cigarettes   Smokeless tobacco: Never   Tobacco comments:    .5 PK PER DAY  Vaping Use   Vaping Use: Never used  Substance and Sexual Activity   Alcohol use: No    Comment: Patient denies abuse, states social drinker   Drug use: Yes    Types: Cocaine, "Crack"  cocaine    Comment: heroin   Sexual activity: Yes  Other Topics Concern   Not on file  Social History Narrative   Lives in [place] with his sister and girlfriend. He works Aeronautical engineer work.    Social Determinants of Health   Financial Resource Strain: High Risk   Difficulty of Paying Living Expenses: Very hard  Food Insecurity: Food Insecurity Present   Worried About Charity fundraiser in the Last Year: Sometimes true   Ran Out of Food in the Last Year: Sometimes true  Transportation Needs: No Transportation Needs   Lack of Transportation (Medical): No   Lack of Transportation (Non-Medical): No  Physical Activity: Not on file  Stress: Not on file  Social Connections: Not on file  Intimate Partner Violence: Not At Risk   Fear of Current or Ex-Partner: No   Emotionally Abused: No   Physically Abused: No   Sexually Abused: No    Family History  Problem Relation Age of Onset   Cirrhosis Father    Heart attack Mother    Heart attack Sister    Heart  failure Sister    Heart attack Brother    Pulse 99   Wt 75.3 kg   SpO2 98%   BMI 23.82 kg/m   Wt Readings from Last 3 Encounters:  06/27/21 75.3 kg  06/23/21 75 kg  06/16/21 74.7 kg   PHYSICAL EXAM:  General:  NAD. Mild SOB with speaking. HEENT: Normal Neck: Supple. JVP 6-7. Carotids 2+ bilat; no bruits. No lymphadenopathy or thryomegaly appreciated. Cor: PMI nondisplaced. Irregular rate & rhythm. No rubs, gallops, +MR, +S3 Lungs: Clear, diminished Abdomen: Soft, nontender, +distended. No hepatosplenomegaly. No bruits or masses. Good bowel sounds. Extremities: No cyanosis, clubbing, rash, 2+ LE edema to knees Neuro: alert & oriented x 3, cranial nerves grossly intact. Moves all 4 extremities w/o difficulty. Affect pleasant.   EKG: SR, LVH 99 bpm (personally reviewed).   REDs: 37%  ASSESSMENT & PLAN: 1. End Stage Systolic HF:  Anterior MI in 2015, DES to LAD.  Echo in 2015 with EF 40-45%, thought to  be ischemic cardiomyopathy. Cath in 10/19 showed 90% ostial OM1 and 70% PLV, no interventional target.  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 Last Echo 10/21 showed EF 20-25% with mild RV dysfunction.  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  Cocaine likely plays a role in his cardiomyopathy. Multiple re-admits for cardiogenic shock. Unfortunately, not a candidate for advanced therapies or home milrinone with active use of cocaine and gastric cancer.  He has end-stage HF.  - Given social situation, gastric cancer, and ongoing substance abuse,  he is not a candidate for any advanced therapies including home milrinone - NYHA III. Reds Clip 37%. Mild volume overload. Continue torsemide 80 mg bid for now. - Off bidil due to hypotension.  - Off spiro/Entresto with recent AKI.  No room to add with hypotension.  - Off dig with recent AKI. - Continue Jardiance 10 mg daily. BMET today. - Continue ivabradine 5 mg bid.   - No ? blocker w/ h/o low output and cocaine use. + cocaine 05/13/21. 2. CAD: H/o anterior STEMI with DES to LAD in 7/15. Cath in 10/19 with 90% ostial OM1 and 70% PLV, no interventional target. No chest pain.  - Continue ASA 81 and atorvastatin 80 mg daily.  - Needs to refrain from using cocaine. - Lipids (1/22) ok.  3. Cocaine abuse: Has been + cocaine each hospital visit.  05/13/21, 10/31/20 10/27/20, 10/03/20 Encouraged to avoid cocaine.   5. CKD: Stage 3- Had AKI in May & June 2022. Check BMET today.  6. Mitral Regurgitation: 09/2020 Moderate to severe on recent echo, suspect functional MR from dilated CMP.  - Not a candidate for Mitraclip candidate with cocaine abuse and compliance issues.  7. Gastric Cancer: Intramucosal adenocarcinoma.  - FOLFOX chemo ongoing.  - Has EGD later today.  - Followed by Oncology and GI.   Check BMET today and in 7 days.   Follow up in 1 week. He is at high risk for  re-admit.  [place], FNP-BC 06/27/21

## 2021-06-27 NOTE — Patient Instructions (Signed)
EKG done today.  Labs done today. We will contact you only if your labs are abnormal.  INCREASE Laisx to 80mg  (2 tablets)  by mouth 2 times daily.   No other medication changes were made. Please continue all current medications as prescribed.  Your physician recommends that you schedule a follow-up appointment in: 1 week  If you have any questions or concerns before your next appointment please send [place] a message through [place] or call our office at [phone].    TO LEAVE A MESSAGE FOR THE NURSE SELECT OPTION 2, PLEASE LEAVE A MESSAGE INCLUDING: YOUR NAME DATE OF BIRTH CALL BACK NUMBER REASON FOR CALL**this is important as we prioritize the call backs  YOU WILL RECEIVE A CALL BACK THE SAME DAY AS LONG AS YOU CALL BEFORE 4:00 PM   Do the following things EVERYDAY: Weigh yourself in the morning before breakfast. Write it down and keep it in a log. Take your medicines as prescribed Eat low salt foods--Limit salt (sodium) to 2000 mg per day.  Stay as active as you can everyday Limit all fluids for the day to less than 2 liters   At the [facility], you and your health needs are our priority. As part of our continuing mission to provide you with exceptional heart care, we have created designated Provider Care Teams. These Care Teams include your primary Cardiologist (physician) and Advanced Practice Providers (APPs- Physician Assistants and Nurse Practitioners) who all work together to provide you with the care you need, when you need it.   You may see any of the following providers on your designated Care Team at your next follow up: Dr Glori Bickers Dr Haynes Kerns, NP Lyda Jester, [place] Audry Riles, PharmD   Please be sure to bring in all your medications bottles to every appointment.

## 2021-07-01 ENCOUNTER — Ambulatory Visit
Admission: RE | Admit: 2021-07-01 | Discharge: 2021-07-01 | Disposition: A | Discharge status: Home / Self Care | Payer: Medicaid Other | Source: Ambulatory Visit | Attending: Radiation Oncology | Admitting: Radiation Oncology

## 2021-07-01 ENCOUNTER — Other Ambulatory Visit: Payer: Self-pay

## 2021-07-01 DIAGNOSIS — R200 Anesthesia of skin: Secondary | ICD-10-CM | POA: Diagnosis not present

## 2021-07-01 DIAGNOSIS — N189 Chronic kidney disease, unspecified: Secondary | ICD-10-CM | POA: Diagnosis not present

## 2021-07-01 DIAGNOSIS — R110 Nausea: Secondary | ICD-10-CM | POA: Diagnosis not present

## 2021-07-01 DIAGNOSIS — C163 Malignant neoplasm of pyloric antrum: Secondary | ICD-10-CM | POA: Diagnosis not present

## 2021-07-01 DIAGNOSIS — Z510 Encounter for antineoplastic radiation therapy: Secondary | ICD-10-CM | POA: Diagnosis not present

## 2021-07-02 ENCOUNTER — Telehealth: Payer: Self-pay

## 2021-07-02 ENCOUNTER — Telehealth: Payer: Self-pay | Admitting: Licensed Clinical Social Worker

## 2021-07-02 ENCOUNTER — Ambulatory Visit
Admission: RE | Admit: 2021-07-02 | Discharge: 2021-07-02 | Disposition: A | Discharge status: Home / Self Care | Payer: Medicaid Other | Source: Ambulatory Visit | Attending: Radiation Oncology | Admitting: Radiation Oncology

## 2021-07-02 DIAGNOSIS — R110 Nausea: Secondary | ICD-10-CM | POA: Diagnosis not present

## 2021-07-02 DIAGNOSIS — C163 Malignant neoplasm of pyloric antrum: Secondary | ICD-10-CM | POA: Diagnosis not present

## 2021-07-02 DIAGNOSIS — R200 Anesthesia of skin: Secondary | ICD-10-CM | POA: Diagnosis not present

## 2021-07-02 DIAGNOSIS — Z510 Encounter for antineoplastic radiation therapy: Secondary | ICD-10-CM | POA: Diagnosis not present

## 2021-07-02 DIAGNOSIS — N189 Chronic kidney disease, unspecified: Secondary | ICD-10-CM | POA: Diagnosis not present

## 2021-07-02 NOTE — Telephone Encounter (Signed)
Checking on client pass treatment 06-27-21 states it went well ,willing to consider placement into assisted living .Will think about it over the week end ,TC received from facility stating they were considering him for placement he stated. Nurse discussed with client the need for him to be housed due to his medical needs and the difficulty in getting into the shelter at this time due to building issues/work being done and no residents are at the center during this time. Client states he understands the options and will do what needs to be done. Nurse and SW will keep client updated.

## 2021-07-02 NOTE — Telephone Encounter (Signed)
06-26-21 TC to client ,visit went well .

## 2021-07-02 NOTE — Telephone Encounter (Signed)
[place] CSW Progress Note  Holiday representative returned call to [employer], Tourist information centre manager with [employer]. She provided update that she has looked into food programs, but they do not have one through pt's insurance and she could not find hot meals to be delivered locally. She was able to have someone drop off food at the hotel where pt is staying to help for this week.    Renee asked if she can help with finding ALFs. CSW updated that [facility] is reviewing pt's file and will determine if they are able to take him this week (CSW waiting on return call). If they are not, CSW will reach back out to [facility] to explore other ALF options.    Christeen Douglas , LCSW

## 2021-07-02 NOTE — Telephone Encounter (Signed)
06-26-21 Client sen by SW at Cancer center open to assisted living if he can be admitted. SW will make some referrals and get back with nurse

## 2021-07-02 NOTE — Telephone Encounter (Signed)
TC to [facility] to determine status of bed placement for pt. Admission coordinator unavailable at this time. Left message asking for a return call.   Christeen Douglas, LCSW

## 2021-07-02 NOTE — Telephone Encounter (Signed)
07-01-21   TC to check  on client ,states treatment went well ,will accept assisted living if care option can be worked out. Reminded of appointments for the week  ,has two visits on Thursday ,one at [facility] will remind client on Wednesday pm again.Nurse had made contact with SW at Cancer center and phone call was returned ,left nurse a message that she would update on facility admission for client if it occurs this week .client 's stay at hotel will be extended until Saturday pending assisted living admission.

## 2021-07-03 ENCOUNTER — Telehealth: Payer: Self-pay | Admitting: Licensed Clinical Social Worker

## 2021-07-03 ENCOUNTER — Other Ambulatory Visit: Payer: Self-pay

## 2021-07-03 ENCOUNTER — Inpatient Hospital Stay: Payer: Medicaid Other | Attending: Oncology | Admitting: Nurse Practitioner

## 2021-07-03 ENCOUNTER — Encounter: Payer: Self-pay | Admitting: Nurse Practitioner

## 2021-07-03 ENCOUNTER — Ambulatory Visit
Admission: RE | Admit: 2021-07-03 | Discharge: 2021-07-03 | Disposition: A | Discharge status: Home / Self Care | Payer: Medicaid Other | Source: Ambulatory Visit | Attending: Radiation Oncology | Admitting: Radiation Oncology

## 2021-07-03 ENCOUNTER — Encounter: Payer: Self-pay | Admitting: *Deleted

## 2021-07-03 ENCOUNTER — Inpatient Hospital Stay: Payer: Medicaid Other

## 2021-07-03 VITALS — BP 98/64 | HR 103 | Temp 98.2°F | Resp 18 | Ht 70.0 in | Wt 156.8 lb

## 2021-07-03 DIAGNOSIS — Z510 Encounter for antineoplastic radiation therapy: Secondary | ICD-10-CM | POA: Insufficient documentation

## 2021-07-03 DIAGNOSIS — N189 Chronic kidney disease, unspecified: Secondary | ICD-10-CM | POA: Diagnosis not present

## 2021-07-03 DIAGNOSIS — R200 Anesthesia of skin: Secondary | ICD-10-CM | POA: Diagnosis not present

## 2021-07-03 DIAGNOSIS — R110 Nausea: Secondary | ICD-10-CM | POA: Insufficient documentation

## 2021-07-03 DIAGNOSIS — C163 Malignant neoplasm of pyloric antrum: Secondary | ICD-10-CM | POA: Diagnosis not present

## 2021-07-03 LAB — CBC WITH DIFFERENTIAL (CANCER CENTER ONLY)
Abs Immature Granulocytes: 0.03 10*3/uL (ref 0.00–0.07)
Basophils Absolute: 0 10*3/uL (ref 0.0–0.1)
Basophils Relative: 1 %
Eosinophils Absolute: 0.1 10*3/uL (ref 0.0–0.5)
Eosinophils Relative: 1 %
HCT: 38.4 % — ABNORMAL LOW (ref 39.0–52.0)
Hemoglobin: 12.7 g/dL — ABNORMAL LOW (ref 13.0–17.0)
Immature Granulocytes: 1 %
Lymphocytes Relative: 6 %
Lymphs Abs: 0.4 10*3/uL — ABNORMAL LOW (ref 0.7–4.0)
MCH: 25.9 pg — ABNORMAL LOW (ref 26.0–34.0)
MCHC: 33.1 g/dL (ref 30.0–36.0)
MCV: 78.4 fL — ABNORMAL LOW (ref 80.0–100.0)
Monocytes Absolute: 0.8 10*3/uL (ref 0.1–1.0)
Monocytes Relative: 14 %
Neutro Abs: 4.3 10*3/uL (ref 1.7–7.7)
Neutrophils Relative %: 77 %
Platelet Count: 227 10*3/uL (ref 150–400)
RBC: 4.9 MIL/uL (ref 4.22–5.81)
RDW: 21.1 % — ABNORMAL HIGH (ref 11.5–15.5)
WBC Count: 5.6 10*3/uL (ref 4.0–10.5)
nRBC: 0 % (ref 0.0–0.2)

## 2021-07-03 LAB — CMP (CANCER CENTER ONLY)
ALT: 23 U/L (ref 0–44)
AST: 18 U/L (ref 15–41)
Albumin: 3.8 g/dL (ref 3.5–5.0)
Alkaline Phosphatase: 112 U/L (ref 38–126)
Anion gap: 9 (ref 5–15)
BUN: 50 mg/dL — ABNORMAL HIGH (ref 6–20)
CO2: 27 mmol/L (ref 22–32)
Calcium: 9.5 mg/dL (ref 8.9–10.3)
Chloride: 97 mmol/L — ABNORMAL LOW (ref 98–111)
Creatinine: 1.28 mg/dL — ABNORMAL HIGH (ref 0.61–1.24)
GFR, Estimated: >60 mL/min (ref >60)
Glucose, Bld: 153 mg/dL — ABNORMAL HIGH (ref 70–99)
Potassium: 4.4 mmol/L (ref 3.5–5.1)
Sodium: 133 mmol/L — ABNORMAL LOW (ref 135–145)
Total Bilirubin: 1.3 mg/dL — ABNORMAL HIGH (ref 0.3–1.2)
Total Protein: 7.4 g/dL (ref 6.5–8.1)

## 2021-07-03 NOTE — Progress Notes (Signed)
[place] OFFICE PROGRESS NOTE   Diagnosis: Gastric cancer  INTERVAL HISTORY:   Tyler Wong returns for follow-up.  He began radiation/Xeloda 06/25/2021.  He misunderstood the instructions for Xeloda and has been taking once a day rather than twice a day.  He denies nausea/vomiting.  No mouth sores.  No diarrhea.  He has numbness in the feet, some pain.  Leg swelling is better.  Shortness of breath has improved.  Abdominal pain is better.  Objective:  Vital signs in last 24 hours:  Blood pressure 98/64, pulse (!) 103, temperature 98.2 F (36.8 C), temperature source Oral, resp. rate 18, height _0  (1.778 m), weight 156 lb 12.8 oz (71.1 kg), SpO2 100 %.    HEENT: No thrush or ulcers. Resp: Lungs clear bilaterally. Cardio: Regular rate and rhythm. GI: Abdomen soft and nontender.  No hepatomegaly. Vascular: No leg edema. Skin: Palms dry appearing with hyperpigmentation.  Soles also dry appearing, some peeling. Port-A-Cath without erythema.   Lab Results:  Lab Results  Component Value Date   WBC 5.6 07/03/2021   HGB 12.7 (L) 07/03/2021   HCT 38.4 (L) 07/03/2021   MCV 78.4 (L) 07/03/2021   PLT 227 07/03/2021   NEUTROABS 4.3 07/03/2021    Imaging:  No results found.  Medications: I have reviewed the patient's current medications.  Assessment/Plan: Gastric cancer CT abdomen/pelvis 11/01/2020-masslike area measuring 6.3 x 5.6 x 5.6 cm in the antral prepyloric region of the stomach Upper endoscopy 11/05/2020-patchy white plaques in the distal esophagus; 3 nonbleeding cratered gastric ulcers with pigmented material in the gastric antrum largest 10 mm; segmental moderate inflammation characterized by congestion, erosions and erythema in the gastric antrum.  Biopsy of stomach ulcer-intramucosal adenocarcinoma arising in a background of intestinal metaplasia, HER-2 negative PET 11/2020-focal area of hypermetabolic activity at the gastric antrum, averages into the  liver and small liver lesion adjacent to stomach not excluded, small chest lymph nodes with mild increase in metabolic activity-nonspecific, symmetric parotid and extraocular muscle activity-likely physiologic Cycle 1 FOLFOX 12/12/2020 Cycle 2 FOLFOX 12/26/2020 Cycle 3 FOLFOX 01/08/2021 Cycle 4 FOLFOX 01/22/2021 Cycle 5 FOLFOX 02/05/2021-Udenyca Cycle 6 FOLFOX 02/19/2021-Udenyca CTs 02/20/2021-previously noted mass of the posterior gastric antrum no longer seen.  No lymphadenopathy or metastatic disease in the abdomen or pelvis. CT abdomen/pelvis 06/17/2021-haziness and question of wall thickening at the distal duodenum with free fluid in the upper abdomen MRI abdomen/MRCP 06/20/2021-diffuse gallbladder wall thickening, no evidence of mesenteric or retroperitoneal adenopathy, primary gastric mass not appreciated Upper endoscopy 06/20/2021-large amount of food residue in the gastric body, pylorus widely patent, no obvious tumor or ulceration, duodenum normal Radiation/Xeloda 06/25/2021 CHF, LVEF 20-25% 10/04/2020 CAD status post prior LAD stent COPD Chronic kidney disease Cocaine usage Port-A-Cath placement 12/06/2020, Interventional Radiology Mild neutropenia secondary to chemotherapy-Udenyca added with cycle 5 FOLFOX Hospital admission 05/13/2021- acute exacerbation of CHF/end-stage heart failure Hospital admission 06/17/2021-abdominal pain    Disposition: Tyler Wong appears stable.  He began radiation/Xeloda 06/25/2021.  He misunderstood the Xeloda instructions and has been taking once a day rather than twice a day.  We reviewed dosing instructions today.  He will begin twice daily dosing on days of radiation.    He has mild hand-foot syndrome.  He will begin applying a lotion.  He will contact the office if symptoms worsen.  We reviewed the CBC and chemistry panel from today.  Labs adequate to proceed as above.  He will return for lab and follow-up in 2 weeks.  We are  available to see him sooner if  needed.    Ned Card ANP/GNP-BC   07/03/2021  11:07 AM

## 2021-07-03 NOTE — Telephone Encounter (Signed)
[place] CSW Progress Note  Holiday representative spoke with Hydia, Development worker, international aid for [facility], by phone. Admission is still pending approval. Per Hydia, she should have a decision by 9 or 10am tomorrow (Friday).    CSW left VM with update for congregational RN, Mariann Laster, to inform her of the above and relay potential need to extend hotel stay.    Christeen Douglas , LCSW

## 2021-07-03 NOTE — Progress Notes (Signed)
Advanced Heart Failure Clinic Note   PCP: [name], NP Oncology: Dr Benay Spice  [facility]: Dr. Aundra Dubin   Reason for Visit:  Heart Failure  HPI: 55 y.o. 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 Also w/ h/o poor compliance. Diagnosed w/ COVID 09/19/20.   Admitted 10/21 w/ acute on chronic systolic heart failure w/ low output and AKI requiring milrinone to help w/ diuresis. He was cocaine positive. Diuresed and weaned off milrinone w/ stable co-ox at 65%. Scr had improved from 2.1>>1.7. D/c wt 159 lb. Unfortunately, his wife was also hospitalized for COVID and died while he was admitted.   Readmitted again 11/21 with marked volume overload and low output heart failure, cocaine+ again. He complained of abdominal pain, initially suspected to be 2/2 low output HF but CT showed a mass-like area in the antral pre-pyloric region of the stomach. EGD with complicated distal stomach ulcers. Pathology confirmed gastric cancer, intramucosal adenocarcinoma. Seen by oncology and not felt to be a candidate for surgery given other comorbidities.   He follows with Dr. Benay Spice and has been getting FOLFOX chemotherapy. Had porta cath placed 10/04/20.   Admitted 02/5360 with A/C systolic heart failure + AKI. + cocaine on UDS. Had low output heart failure and was placed on milrinone and diuresed with IV lasix. Once diuresed he was transitioned to torsemide 80 mg daily. Bidil, entresto, digoxin, and spirononlactone discontinued and not restarted due to AKI.   He returned to clinic 06/11/21 for post hospital follow up with his sister. Complaining of abdominal discomfort and nausea. Says he has ongoing issues with abdominal bloating. Overall feeling fine. SOB with exertion. Denies /PND/Orthopnea.  Appetite ok. No fever or chills. He has not been weighing because he has not scale. Taking all medications and has  been taking digoxin. . Lives in a motel. He is on the list for low income housing.   He was admitted 06/17/21 from Oncology office for concerns for cholecystitis. In the ED, CT abdomen pelvis showed acute acalculous cholecystitis, however HIDA scan negative.  EGD showed large amount of retained food. Hospitalization c/b AKI, hypoxic respiratory failure 2/2 to [name]. He was diuresed with IV lasix 160 mg bid. UDS + for benzos/cocaine this admission. Palliative care consulted, he wanted to remain full code. His discharge weight was 165 lbs.   He was seen for post-hospitalization HF follow up 06/27/21. He has been taking torsemide 80 mg bid. Discharge instructions instruct 80 mg daily. Unclear if he is on ivabradine currently. Used cocaine couple days ago. Living in hotel currently, plans to transition to [employer] when available.   Today he returns for HF follow up. Overall feeling fine. Denies increasing SOB, CP, dizziness, edema, or PND/Orthopnea. Appetite ok. No fever or chills. Weight at home 154 pounds. Taking all medications, did not bring all medications with him. No cocaine use this week.   Labs (1/22): K 3.3, creatinine 1.29, hgb 13.8 Labs (2/22): K 3.8, creatinine 1.37, hgb 13.0 Labs (05/13/21): Creatinine 2.2  Labs (05/21/21): Creatinine 1.7  Labs (6/22): K 3.6, creatinine 1.92  Review of systems complete and found to be negative unless listed in HPI.    PMH: 1. CAD: STEMI 2015 with DES to LAD.  - Cath in 10/19 showed 90% ostial OM1 and  70% PLV, no interventional target. 2. Hyperlipidemia 3. Cocaine abuse.  4. COPD: Ongoing smoking.  5. GERD 6. Gastric cancer: Intramucosal adenocarcinoma.  - FOLFOX chemotherapy 7. Chronic systolic CHF:  Mixed ischemic/nonischemic (cocaine) cardiomyopathy.  Thought to be end stage, has had low output requiring milrinone in the  hospital but not candidate for LVAD, transplant, or home milrinone given ongoing cocaine abuse.  - Echo (10/21): EF 20-25%, global HK with moderate dilation, mild LVH, RV low normal systolic function, PASP 57 mmHg, moderate-severe MR, moderate-severe TR.  8. COVID-19 infection: 9/21.   Current Outpatient Medications  Medication Instructions   albuterol (VENTOLIN HFA) 108 (90 Base) MCG/ACT inhaler 2 puffs, Inhalation, Every 4 hours PRN   ASPIRIN LOW DOSE 81 MG EC tablet TAKE 1 TABLET (81 MG TOTAL) BY MOUTH DAILY (MORNING)   atorvastatin (LIPITOR) 80 mg, Oral, Daily   capecitabine (XELODA) 1,500 mg, Oral, 2 times daily after meals, Take Monday-Friday. Take only on days of radiation.   empagliflozin (JARDIANCE) 10 mg, Oral, Daily   fluconazole (DIFLUCAN) 200 mg, Oral, Daily at bedtime   gabapentin (NEURONTIN) 100 mg, Oral, 2 times daily   metoCLOPramide (REGLAN) 5 mg, Oral, Every 8 hours PRN   Multiple Vitamins-Minerals (MULTIVITAMIN WITH MINERALS) tablet 1 tablet, Oral, Daily   nicotine (NICODERM CQ - DOSED IN MG/24 HR) 7 mg, Transdermal, Daily   pantoprazole (PROTONIX) 40 mg, Oral, 2 times daily   potassium chloride SA (KLOR-CON) 20 MEQ tablet 40 mEq, Oral, Daily   Torsemide 80 mg, Oral, 2 times daily    No current facility-administered medications for this encounter.   No Known Allergies  Social History   Socioeconomic History   Marital status: Single    Spouse name: Not on file   Number of children: 2   Years of education: 63   Highest education level: 12th grade  Occupational History   Occupation: Landscaping  Tobacco Use   Smoking status: Every Day    Packs/day: 0.25    Years: 30.00    Pack years: 7.50    Types: Cigarettes   Smokeless tobacco: Never   Tobacco comments:    .5 PK PER DAY  Vaping Use   Vaping Use: Never used  Substance and Sexual Activity   Alcohol use: No    Comment: Patient denies abuse, states social drinker   Drug use: Yes    Types: Cocaine,  "Crack" cocaine    Comment: heroin   Sexual activity: Yes  Other Topics Concern   Not on file  Social History Narrative   Lives in [place] with his sister and girlfriend. He works Aeronautical engineer work.    Social Determinants of Health   Financial Resource Strain: High Risk   Difficulty of Paying Living Expenses: Very hard  Food Insecurity: Food Insecurity Present   Worried About Charity fundraiser in the Last Year: Sometimes true   Ran Out of Food in the Last Year: Sometimes true  Transportation Needs: No Transportation Needs   Lack of Transportation (Medical): No   Lack of Transportation (Non-Medical): No  Physical Activity: Not on file  Stress: Not on file  Social Connections: Not on file  Intimate Partner Violence: Not At Risk   Fear of Current or Ex-Partner: No   Emotionally Abused: No   Physically Abused: No   Sexually Abused: No    Family History  Problem Relation Age of Onset   Cirrhosis Father    Heart attack Mother    Heart attack  Sister    Heart failure Sister    Heart attack Brother    BP 92/60   Pulse (!) 104   Wt 72.2 kg (159 lb 3.2 oz)   SpO2 97%   BMI 22.84 kg/m   Wt Readings from Last 3 Encounters:  07/04/21 72.2 kg (159 lb 3.2 oz)  07/03/21 71.1 kg (156 lb 12.8 oz)  06/27/21 75.3 kg (166 lb)   PHYSICAL EXAM:  General:  NAD. No resp difficulty HEENT: Normal Neck: Supple. No JVD. Carotids 2+ bilat; no bruits. No lymphadenopathy or thryomegaly appreciated. Cor: PMI nondisplaced. Regular rate & rhythm. No rubs, gallops , +MR Lungs: Clear Abdomen: Soft, nontender, nondistended. No hepatosplenomegaly. No bruits or masses. Good bowel sounds. Extremities: No cyanosis, clubbing, rash, edema Neuro: Alert & oriented x 3, cranial nerves grossly intact. Moves all 4 extremities w/o difficulty. Affect pleasant.  ASSESSMENT & PLAN: 1. End Stage Systolic HF:  Anterior MI in 2015, DES to LAD.  Echo in 2015 with EF 40-45%, thought to be ischemic  cardiomyopathy. Cath in 10/19 showed 90% ostial OM1 and 70% PLV, no interventional target.  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 Last Echo 10/21 showed EF 20-25% with mild RV dysfunction.  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  Cocaine likely plays a role in his cardiomyopathy. Multiple re-admits for cardiogenic shock. Unfortunately, not a candidate for advanced therapies or home milrinone with active use of cocaine and gastric cancer.  He has end-stage HF.  - Given social situation, gastric cancer, and ongoing substance abuse,  he is not a candidate for any advanced therapies including home milrinone - NYHA II-III. Volume looks good today. Weight back down. - Continue torsemide 80 mg bid for now. SCr yesterday 1.28, K 4.4. - Off bidil due to hypotension.  - Off spiro/Entresto with recent AKI.  No room to add with low BP. - Off dig with recent AKI. - Continue Jardiance 10 mg daily.  - Continue ivabradine 5 mg bid.   - No ? blocker w/ h/o low output and cocaine use. + cocaine 05/13/21. 2. CAD: H/o anterior STEMI with DES to LAD in 7/15. Cath in 10/19 with 90% ostial OM1 and 70% PLV, no interventional target. No chest pain.  - Continue ASA 81 and atorvastatin 80 mg daily.  - Needs to refrain from using cocaine. - Lipids (1/22) ok.  3. Cocaine abuse: Has been + cocaine each hospital visit.  05/13/21, 10/31/20 10/27/20, 10/03/20 Encouraged to avoid cocaine.   4. CKD: Stage 3- Had AKI in May & June 2022. BMET yesterday OK. 5. Mitral Regurgitation: 09/2020 Moderate to severe on recent echo, suspect functional MR from dilated CMP.  - Not a candidate for Mitraclip candidate with cocaine abuse and compliance issues.  6. Gastric Cancer: Intramucosal adenocarcinoma.  - FOLFOX chemo ongoing.  - Followed by Oncology and GI.   BMET yesterday at Oncology was ok. Will follow up in 3-4 weeks with APP. Explained he needs to  bring all meds with him next time.  [place], FNP-BC 07/04/21

## 2021-07-03 NOTE — Progress Notes (Signed)
Provided patient 1 case of Ensure and #2 Visa pre-paid cards.

## 2021-07-04 ENCOUNTER — Telehealth: Payer: Self-pay | Admitting: Physician Assistant

## 2021-07-04 ENCOUNTER — Inpatient Hospital Stay (HOSPITAL_BASED_OUTPATIENT_CLINIC_OR_DEPARTMENT_OTHER): Payer: Medicaid Other | Admitting: Physician Assistant

## 2021-07-04 ENCOUNTER — Encounter: Payer: Self-pay | Admitting: Licensed Clinical Social Worker

## 2021-07-04 ENCOUNTER — Encounter (HOSPITAL_COMMUNITY): Payer: Self-pay

## 2021-07-04 ENCOUNTER — Other Ambulatory Visit: Payer: Self-pay | Admitting: Physician Assistant

## 2021-07-04 ENCOUNTER — Ambulatory Visit (HOSPITAL_COMMUNITY)
Admission: RE | Admit: 2021-07-04 | Discharge: 2021-07-04 | Disposition: A | Discharge status: Home / Self Care | Payer: Medicaid Other | Source: Ambulatory Visit | Attending: Family Medicine | Admitting: Family Medicine

## 2021-07-04 ENCOUNTER — Ambulatory Visit
Admission: RE | Admit: 2021-07-04 | Discharge: 2021-07-04 | Disposition: A | Discharge status: Home / Self Care | Payer: Medicaid Other | Source: Ambulatory Visit | Attending: Radiation Oncology | Admitting: Radiation Oncology

## 2021-07-04 VITALS — BP 98/68 | HR 112 | Temp 97.3°F | Resp 18 | Wt 159.6 lb

## 2021-07-04 VITALS — BP 92/60 | HR 104 | Wt 159.2 lb

## 2021-07-04 DIAGNOSIS — Z955 Presence of coronary angioplasty implant and graft: Secondary | ICD-10-CM | POA: Insufficient documentation

## 2021-07-04 DIAGNOSIS — R110 Nausea: Secondary | ICD-10-CM | POA: Diagnosis not present

## 2021-07-04 DIAGNOSIS — I252 Old myocardial infarction: Secondary | ICD-10-CM | POA: Insufficient documentation

## 2021-07-04 DIAGNOSIS — Z79899 Other long term (current) drug therapy: Secondary | ICD-10-CM | POA: Insufficient documentation

## 2021-07-04 DIAGNOSIS — Z8379 Family history of other diseases of the digestive system: Secondary | ICD-10-CM | POA: Insufficient documentation

## 2021-07-04 DIAGNOSIS — E785 Hyperlipidemia, unspecified: Secondary | ICD-10-CM | POA: Diagnosis not present

## 2021-07-04 DIAGNOSIS — J449 Chronic obstructive pulmonary disease, unspecified: Secondary | ICD-10-CM | POA: Diagnosis not present

## 2021-07-04 DIAGNOSIS — Z7984 Long term (current) use of oral hypoglycemic drugs: Secondary | ICD-10-CM | POA: Insufficient documentation

## 2021-07-04 DIAGNOSIS — Z8616 Personal history of COVID-19: Secondary | ICD-10-CM | POA: Diagnosis not present

## 2021-07-04 DIAGNOSIS — Z8249 Family history of ischemic heart disease and other diseases of the circulatory system: Secondary | ICD-10-CM | POA: Diagnosis not present

## 2021-07-04 DIAGNOSIS — F1410 Cocaine abuse, uncomplicated: Secondary | ICD-10-CM | POA: Diagnosis not present

## 2021-07-04 DIAGNOSIS — I5022 Chronic systolic (congestive) heart failure: Secondary | ICD-10-CM | POA: Diagnosis not present

## 2021-07-04 DIAGNOSIS — N1832 Chronic kidney disease, stage 3b: Secondary | ICD-10-CM

## 2021-07-04 DIAGNOSIS — Z20822 Contact with and (suspected) exposure to covid-19: Secondary | ICD-10-CM | POA: Insufficient documentation

## 2021-07-04 DIAGNOSIS — N183 Chronic kidney disease, stage 3 unspecified: Secondary | ICD-10-CM | POA: Insufficient documentation

## 2021-07-04 DIAGNOSIS — I340 Nonrheumatic mitral (valve) insufficiency: Secondary | ICD-10-CM | POA: Diagnosis not present

## 2021-07-04 DIAGNOSIS — Z510 Encounter for antineoplastic radiation therapy: Secondary | ICD-10-CM | POA: Diagnosis not present

## 2021-07-04 DIAGNOSIS — I255 Ischemic cardiomyopathy: Secondary | ICD-10-CM | POA: Insufficient documentation

## 2021-07-04 DIAGNOSIS — I5084 End stage heart failure: Secondary | ICD-10-CM | POA: Insufficient documentation

## 2021-07-04 DIAGNOSIS — I2510 Atherosclerotic heart disease of native coronary artery without angina pectoris: Secondary | ICD-10-CM | POA: Diagnosis not present

## 2021-07-04 DIAGNOSIS — Z720 Tobacco use: Secondary | ICD-10-CM

## 2021-07-04 DIAGNOSIS — F17210 Nicotine dependence, cigarettes, uncomplicated: Secondary | ICD-10-CM | POA: Insufficient documentation

## 2021-07-04 DIAGNOSIS — C169 Malignant neoplasm of stomach, unspecified: Secondary | ICD-10-CM

## 2021-07-04 DIAGNOSIS — Z9861 Coronary angioplasty status: Secondary | ICD-10-CM

## 2021-07-04 DIAGNOSIS — C163 Malignant neoplasm of pyloric antrum: Secondary | ICD-10-CM | POA: Diagnosis not present

## 2021-07-04 DIAGNOSIS — R200 Anesthesia of skin: Secondary | ICD-10-CM | POA: Diagnosis not present

## 2021-07-04 DIAGNOSIS — N189 Chronic kidney disease, unspecified: Secondary | ICD-10-CM | POA: Diagnosis not present

## 2021-07-04 DIAGNOSIS — Z7982 Long term (current) use of aspirin: Secondary | ICD-10-CM | POA: Insufficient documentation

## 2021-07-04 NOTE — Telephone Encounter (Signed)
[name], faith community nurse, wants cb for details of getting patient placed, asked to speak with Jan. Please call back. V

## 2021-07-04 NOTE — Progress Notes (Signed)
COVID-19 Pre-Admission testing performed today. Patient tolerated well without any concerning adverse effects.

## 2021-07-04 NOTE — Patient Instructions (Signed)
No Labs done today.   No medication changes were made. Please continue all current medications as prescribed.  Your physician recommends that you schedule a follow-up appointment in: 5-6 weeks  If you have any questions or concerns before your next appointment please send [place] a message through [place] or call our office at [phone].    TO LEAVE A MESSAGE FOR THE NURSE SELECT OPTION 2, PLEASE LEAVE A MESSAGE INCLUDING: YOUR NAME DATE OF BIRTH CALL BACK NUMBER REASON FOR CALL**this is important as we prioritize the call backs  YOU WILL RECEIVE A CALL BACK THE SAME DAY AS LONG AS YOU CALL BEFORE 4:00 PM   Do the following things EVERYDAY: Weigh yourself in the morning before breakfast. Write it down and keep it in a log. Take your medicines as prescribed Eat low salt foods--Limit salt (sodium) to 2000 mg per day.  Stay as active as you can everyday Limit all fluids for the day to less than 2 liters   At the [facility], you and your health needs are our priority. As part of our continuing mission to provide you with exceptional heart care, we have created designated Provider Care Teams. These Care Teams include your primary Cardiologist (physician) and Advanced Practice Providers (APPs- Physician Assistants and Nurse Practitioners) who all work together to provide you with the care you need, when you need it.   You may see any of the following providers on your designated Care Team at your next follow up: Dr Glori Bickers Dr Haynes Kerns, NP Lyda Jester, [place] Audry Riles, PharmD   Please be sure to bring in all your medications bottles to every appointment.

## 2021-07-04 NOTE — Telephone Encounter (Signed)
Scheduled appt per 7/8 sch msg. Pt aware.  

## 2021-07-04 NOTE — Progress Notes (Signed)
Tyler Wong Progress Note  Clinical Education officer, museum received update from Tyler Wong, admissions coordinator with [facility], that they have a bed for him that will be available on Monday pending negative TB test/chest x-ray and negative covid test.  They would plan to pick him up from the [place] after his appt on Monday.  Wong coordinated with medical team and pt is scheduled for Covid test prior to appt today. Wong sent recent chest x-ray (from 6/25) to Tyler Wong for proof of negative TB.  Wong spoke with patient and informed of potential bed availability and he agreed to placement and testing. Tyler Wong will speak with him directly regarding further details.  Wong updated congregational nurse, Tyler Wong, of the above. She will work with the hotel to extend his stay until his admission to [facility].    Christeen Douglas , LCSW

## 2021-07-05 ENCOUNTER — Telehealth: Payer: Self-pay

## 2021-07-05 LAB — SARS CORONAVIRUS 2 (TAT 6-24 HRS): SARS Coronavirus 2: NEGATIVE

## 2021-07-05 NOTE — Telephone Encounter (Signed)
SW states hospital will do COVID testing and will see if previous chest x-ray will be accepted by the center. Nurse had called nurse at [facility] waiting on her to return call if TB test is still needed

## 2021-07-05 NOTE — Telephone Encounter (Signed)
Reminded of 2 appointments for 07-04-2021 client has am appointment at [facility] and then at 1:15 pm to [facility]. Text message sent and client returned with ok. Transportation had been arranged.

## 2021-07-05 NOTE — Telephone Encounter (Signed)
[facility] will extend stay until Tuesday am pending admission to assisted living

## 2021-07-05 NOTE — Telephone Encounter (Signed)
07-04-2021  SW states assisted living has accepted client for admission on Monday [facility]) will need TB test and negative COVID testing . Nurse mentioned client has appt today at hospital if they could do his COVID test then that would be great. Nurse will call [facility] and see if they can work him in for TB and COVID test if hospital cant do.

## 2021-07-05 NOTE — Telephone Encounter (Signed)
TC to client to check on status and discuss assisted living   Client states treatments this week have gone well . SW had called to let him know he was accepted into assisted living ,he is okay with it and has informed his sisters . Client will be admitted Monday if all requirements are met. Ask if client had food and drink ,needs something to drink.Nurse will drop off some juices 07-05-2021 and several food items .reviewed treatment times for next week ,nurse will arrange transport . Informed client his stay at the hotel was approved until admission to assisted living .

## 2021-07-05 NOTE — Telephone Encounter (Signed)
Transportation for treatments next week 07-07-2021-----07-11-2021 arranged . Will need to change pick up site once client is admitted to assisted living . Pick up time 12:29 pm next week. Will notify client

## 2021-07-06 NOTE — Congregational Nurse Program (Signed)
  Dept: [place] Nurse Program Note  Date of Encounter: 07/06/2021  Past Medical History: Past Medical History:  Diagnosis Date   CAD in native artery 07/17/2014   STEMI- LAD stenosis with DES   CHF (congestive heart failure) (HCC)    Cocaine abuse (HCC)    COPD (chronic obstructive pulmonary disease) ([place])    Diabetes mellitus without complication ([place])    Dyspnea    Gastric cancer ([place]) 11/01/2020   GERD (gastroesophageal reflux disease)    Hypertension    ST elevation myocardial infarction (STEMI) involving left anterior descending (LAD) coronary artery with complication ([place])    Tobacco abuse 07/17/2014    Encounter Details:  CNP Questionnaire - 07/06/21 2041       Questionnaire   Do you give verbal consent to treat you today? Yes    Visit Setting Other    Location Patient Served At [facility] patient    Patient Status Homeless    Medical Provider Yes    Insurance Medicaid    Intervention Counsel;Support;Educate    Housing/Utilities No permanent housing    Transportation Need transportation assistance;Provided transportation assistance ([facility] transp,bus pass, taxi voucher, etc.);Within past 12 months, lack of transportation negatively impacted life    Food Have food insecurities;Within past 12 months, food ran out with no money to buy more    Referrals Area Agency;PCP - [place] visit by nurse to take client drinks and food. Client looking good today able to talk without becoming so short of breathe.Talked about move to assisted living facility not sure of admission time for Monday they may call him but nurse will follow up in am to assure all admission details have been met  .Will contact SW at [facility] center for instructions on move to assisted living and transportation will need to be arranged .Client states this is probably the best move for him now . [facility] extended until Tuesday am .

## 2021-07-07 ENCOUNTER — Other Ambulatory Visit: Payer: Self-pay

## 2021-07-07 ENCOUNTER — Ambulatory Visit
Admission: RE | Admit: 2021-07-07 | Discharge: 2021-07-07 | Disposition: A | Discharge status: Home / Self Care | Payer: Medicaid Other | Source: Ambulatory Visit | Attending: Radiation Oncology | Admitting: Radiation Oncology

## 2021-07-07 DIAGNOSIS — R200 Anesthesia of skin: Secondary | ICD-10-CM | POA: Diagnosis not present

## 2021-07-07 DIAGNOSIS — Z510 Encounter for antineoplastic radiation therapy: Secondary | ICD-10-CM | POA: Diagnosis not present

## 2021-07-07 DIAGNOSIS — C163 Malignant neoplasm of pyloric antrum: Secondary | ICD-10-CM | POA: Diagnosis not present

## 2021-07-07 DIAGNOSIS — R110 Nausea: Secondary | ICD-10-CM | POA: Diagnosis not present

## 2021-07-07 DIAGNOSIS — N189 Chronic kidney disease, unspecified: Secondary | ICD-10-CM | POA: Diagnosis not present

## 2021-07-07 NOTE — Telephone Encounter (Signed)
Call returned to [name], RN, regarding placement for patient.   Message left with call back requested to this CM

## 2021-07-08 ENCOUNTER — Telehealth: Payer: Self-pay

## 2021-07-08 ENCOUNTER — Other Ambulatory Visit: Payer: Self-pay

## 2021-07-08 ENCOUNTER — Other Ambulatory Visit: Payer: Self-pay | Admitting: *Deleted

## 2021-07-08 ENCOUNTER — Ambulatory Visit
Admission: RE | Admit: 2021-07-08 | Discharge: 2021-07-08 | Disposition: A | Discharge status: Home / Self Care | Payer: Medicaid Other | Source: Ambulatory Visit | Attending: Radiation Oncology | Admitting: Radiation Oncology

## 2021-07-08 DIAGNOSIS — R110 Nausea: Secondary | ICD-10-CM | POA: Diagnosis not present

## 2021-07-08 DIAGNOSIS — R200 Anesthesia of skin: Secondary | ICD-10-CM | POA: Diagnosis not present

## 2021-07-08 DIAGNOSIS — N189 Chronic kidney disease, unspecified: Secondary | ICD-10-CM | POA: Diagnosis not present

## 2021-07-08 DIAGNOSIS — C163 Malignant neoplasm of pyloric antrum: Secondary | ICD-10-CM | POA: Diagnosis not present

## 2021-07-08 DIAGNOSIS — Z510 Encounter for antineoplastic radiation therapy: Secondary | ICD-10-CM | POA: Diagnosis not present

## 2021-07-08 NOTE — Telephone Encounter (Signed)
Admission to occur Tuesday am at 10 am ,will take 45 -60 minutes ,client will be able to keep treatment appt  Will call sister and transport service to coordinate

## 2021-07-08 NOTE — Progress Notes (Signed)
Request from [facility] to send most recent medication list. Emailed to supervisor, Clelia Schaumann at [email or web address] as requested.

## 2021-07-08 NOTE — Telephone Encounter (Signed)
Sister will arrange to assist with things in room and get client to facility . Thanked nurse for assistance. Will have client there by 10 am for admission

## 2021-07-08 NOTE — Telephone Encounter (Signed)
Move to assisted living went well ,client settled in and got his treatment today. States everything went very smoothly and again thanked the [employer]. Transportation to treatments has been arranged for the week  and address change completed.  Will arrange transport for next week.

## 2021-07-08 NOTE — Telephone Encounter (Signed)
Nurse will try to coordinate move ,client needs more time ,call to sister to assist.

## 2021-07-08 NOTE — Telephone Encounter (Signed)
Facility did accept COVID results neg and chest x-ray ,move will occur today ,reminded of appt at 1 pm today  for treatment

## 2021-07-08 NOTE — Congregational Nurse Program (Signed)
  Dept: [place] [facility] Nurse Program Note  Date of Encounter: 07/06/2021  Past Medical History: Past Medical History:  Diagnosis Date   CAD in native artery 07/17/2014   STEMI- LAD stenosis with DES   CHF (congestive heart failure) (HCC)    Cocaine abuse (HCC)    COPD (chronic obstructive pulmonary disease) ([place])    Diabetes mellitus without complication ([place])    Dyspnea    Gastric cancer ([place]) 11/01/2020   GERD (gastroesophageal reflux disease)    Hypertension    ST elevation myocardial infarction (STEMI) involving left anterior descending (LAD) coronary artery with complication ([place])    Tobacco abuse 07/17/2014    Encounter Details:  CNP Questionnaire - 07/06/21 2300       Questionnaire   Do you give verbal consent to treat you today? Yes    Visit Setting Other    Location Patient Served At [facility] patient    Patient Status Homeless    Medical Provider Yes    Insurance Medicaid    Intervention Counsel;Educate;Support    Housing/Utilities No permanent housing    Transportation Within past 12 months, lack of transportation negatively impacted life    Food Within past 12 months, worried food would run out with no money to buy more;Within past 12 months, food ran out with no money to buy more    Referrals PCP - [place];Area Agency           Picked up food items and drinks for client and delivered. Today client was doing well ,states he feels good . Discussed plans for move to assisted living pros and cons but client has agreed that is the best option for him now . Will need to follow up with facility on time for admission on Monday but prepared client for move as [facility] was extended until Tuesday . May have to store some of his items ,sister will help with ride to facility .Client very thankful for help from program .  Will follow up with SW and facility on move for Monday and get back with client. Reminded of treatment and time of pick up

## 2021-07-09 ENCOUNTER — Other Ambulatory Visit: Payer: Self-pay

## 2021-07-09 ENCOUNTER — Ambulatory Visit
Admission: RE | Admit: 2021-07-09 | Discharge: 2021-07-09 | Disposition: A | Discharge status: Home / Self Care | Payer: Medicaid Other | Source: Ambulatory Visit | Attending: Radiation Oncology | Admitting: Radiation Oncology

## 2021-07-09 DIAGNOSIS — R200 Anesthesia of skin: Secondary | ICD-10-CM | POA: Diagnosis not present

## 2021-07-09 DIAGNOSIS — R110 Nausea: Secondary | ICD-10-CM | POA: Diagnosis not present

## 2021-07-09 DIAGNOSIS — C163 Malignant neoplasm of pyloric antrum: Secondary | ICD-10-CM | POA: Diagnosis not present

## 2021-07-09 DIAGNOSIS — Z510 Encounter for antineoplastic radiation therapy: Secondary | ICD-10-CM | POA: Diagnosis not present

## 2021-07-09 DIAGNOSIS — N189 Chronic kidney disease, unspecified: Secondary | ICD-10-CM | POA: Diagnosis not present

## 2021-07-10 ENCOUNTER — Other Ambulatory Visit: Payer: Self-pay

## 2021-07-10 ENCOUNTER — Ambulatory Visit
Admission: RE | Admit: 2021-07-10 | Discharge: 2021-07-10 | Disposition: A | Discharge status: Home / Self Care | Payer: Medicaid Other | Source: Ambulatory Visit | Attending: Radiation Oncology | Admitting: Radiation Oncology

## 2021-07-10 DIAGNOSIS — Z510 Encounter for antineoplastic radiation therapy: Secondary | ICD-10-CM | POA: Diagnosis not present

## 2021-07-10 DIAGNOSIS — R110 Nausea: Secondary | ICD-10-CM | POA: Diagnosis not present

## 2021-07-10 DIAGNOSIS — R200 Anesthesia of skin: Secondary | ICD-10-CM | POA: Diagnosis not present

## 2021-07-10 DIAGNOSIS — C163 Malignant neoplasm of pyloric antrum: Secondary | ICD-10-CM | POA: Diagnosis not present

## 2021-07-10 DIAGNOSIS — N189 Chronic kidney disease, unspecified: Secondary | ICD-10-CM | POA: Diagnosis not present

## 2021-07-11 ENCOUNTER — Ambulatory Visit
Admission: RE | Admit: 2021-07-11 | Discharge: 2021-07-11 | Disposition: A | Discharge status: Home / Self Care | Payer: Medicaid Other | Source: Ambulatory Visit | Attending: Radiation Oncology | Admitting: Radiation Oncology

## 2021-07-11 DIAGNOSIS — Z510 Encounter for antineoplastic radiation therapy: Secondary | ICD-10-CM | POA: Diagnosis not present

## 2021-07-11 DIAGNOSIS — C163 Malignant neoplasm of pyloric antrum: Secondary | ICD-10-CM | POA: Diagnosis not present

## 2021-07-11 DIAGNOSIS — R200 Anesthesia of skin: Secondary | ICD-10-CM | POA: Diagnosis not present

## 2021-07-11 DIAGNOSIS — R110 Nausea: Secondary | ICD-10-CM | POA: Diagnosis not present

## 2021-07-11 DIAGNOSIS — N189 Chronic kidney disease, unspecified: Secondary | ICD-10-CM | POA: Diagnosis not present

## 2021-07-11 MED ORDER — SONAFINE EX EMUL
1.0000 "application " | Freq: Once | CUTANEOUS | Status: AC
  Administered 2021-07-11: 1 via TOPICAL

## 2021-07-14 ENCOUNTER — Ambulatory Visit
Admission: RE | Admit: 2021-07-14 | Discharge: 2021-07-14 | Disposition: A | Discharge status: Home / Self Care | Payer: Medicaid Other | Source: Ambulatory Visit | Attending: Radiation Oncology | Admitting: Radiation Oncology

## 2021-07-14 DIAGNOSIS — C163 Malignant neoplasm of pyloric antrum: Secondary | ICD-10-CM | POA: Diagnosis not present

## 2021-07-14 DIAGNOSIS — R110 Nausea: Secondary | ICD-10-CM | POA: Diagnosis not present

## 2021-07-14 DIAGNOSIS — R200 Anesthesia of skin: Secondary | ICD-10-CM | POA: Diagnosis not present

## 2021-07-14 DIAGNOSIS — N189 Chronic kidney disease, unspecified: Secondary | ICD-10-CM | POA: Diagnosis not present

## 2021-07-14 DIAGNOSIS — Z510 Encounter for antineoplastic radiation therapy: Secondary | ICD-10-CM | POA: Diagnosis not present

## 2021-07-15 ENCOUNTER — Ambulatory Visit
Admission: RE | Admit: 2021-07-15 | Discharge: 2021-07-15 | Disposition: A | Discharge status: Home / Self Care | Payer: Medicaid Other | Source: Ambulatory Visit | Attending: Radiation Oncology | Admitting: Radiation Oncology

## 2021-07-15 DIAGNOSIS — C163 Malignant neoplasm of pyloric antrum: Secondary | ICD-10-CM | POA: Diagnosis not present

## 2021-07-15 DIAGNOSIS — N189 Chronic kidney disease, unspecified: Secondary | ICD-10-CM | POA: Diagnosis not present

## 2021-07-15 DIAGNOSIS — Z510 Encounter for antineoplastic radiation therapy: Secondary | ICD-10-CM | POA: Diagnosis not present

## 2021-07-15 DIAGNOSIS — R110 Nausea: Secondary | ICD-10-CM | POA: Diagnosis not present

## 2021-07-15 DIAGNOSIS — R200 Anesthesia of skin: Secondary | ICD-10-CM | POA: Diagnosis not present

## 2021-07-16 ENCOUNTER — Other Ambulatory Visit (HOSPITAL_COMMUNITY): Payer: Self-pay

## 2021-07-16 ENCOUNTER — Ambulatory Visit
Admission: RE | Admit: 2021-07-16 | Discharge: 2021-07-16 | Disposition: A | Discharge status: Home / Self Care | Payer: Medicaid Other | Source: Ambulatory Visit | Attending: Radiation Oncology | Admitting: Radiation Oncology

## 2021-07-16 ENCOUNTER — Other Ambulatory Visit: Payer: Self-pay

## 2021-07-16 DIAGNOSIS — R110 Nausea: Secondary | ICD-10-CM | POA: Diagnosis not present

## 2021-07-16 DIAGNOSIS — Z510 Encounter for antineoplastic radiation therapy: Secondary | ICD-10-CM | POA: Diagnosis not present

## 2021-07-16 DIAGNOSIS — C163 Malignant neoplasm of pyloric antrum: Secondary | ICD-10-CM | POA: Diagnosis not present

## 2021-07-16 DIAGNOSIS — N189 Chronic kidney disease, unspecified: Secondary | ICD-10-CM | POA: Diagnosis not present

## 2021-07-16 DIAGNOSIS — R200 Anesthesia of skin: Secondary | ICD-10-CM | POA: Diagnosis not present

## 2021-07-17 ENCOUNTER — Inpatient Hospital Stay (HOSPITAL_BASED_OUTPATIENT_CLINIC_OR_DEPARTMENT_OTHER): Payer: Medicaid Other | Admitting: Nurse Practitioner

## 2021-07-17 ENCOUNTER — Encounter: Payer: Self-pay | Admitting: Nurse Practitioner

## 2021-07-17 ENCOUNTER — Encounter: Payer: Self-pay | Admitting: *Deleted

## 2021-07-17 ENCOUNTER — Inpatient Hospital Stay: Payer: Medicaid Other

## 2021-07-17 ENCOUNTER — Ambulatory Visit
Admission: RE | Admit: 2021-07-17 | Discharge: 2021-07-17 | Disposition: A | Discharge status: Home / Self Care | Payer: Medicaid Other | Source: Ambulatory Visit | Attending: Radiation Oncology | Admitting: Radiation Oncology

## 2021-07-17 ENCOUNTER — Other Ambulatory Visit: Payer: Self-pay

## 2021-07-17 VITALS — BP 91/79 | HR 59 | Temp 97.7°F | Resp 20 | Wt 159.0 lb

## 2021-07-17 DIAGNOSIS — Z510 Encounter for antineoplastic radiation therapy: Secondary | ICD-10-CM | POA: Diagnosis not present

## 2021-07-17 DIAGNOSIS — C163 Malignant neoplasm of pyloric antrum: Secondary | ICD-10-CM | POA: Diagnosis not present

## 2021-07-17 DIAGNOSIS — R200 Anesthesia of skin: Secondary | ICD-10-CM | POA: Diagnosis not present

## 2021-07-17 DIAGNOSIS — N189 Chronic kidney disease, unspecified: Secondary | ICD-10-CM | POA: Diagnosis not present

## 2021-07-17 DIAGNOSIS — R110 Nausea: Secondary | ICD-10-CM | POA: Diagnosis not present

## 2021-07-17 LAB — CMP (CANCER CENTER ONLY)
ALT: 14 U/L (ref 0–44)
AST: 17 U/L (ref 15–41)
Albumin: 3.9 g/dL (ref 3.5–5.0)
Alkaline Phosphatase: 88 U/L (ref 38–126)
Anion gap: 9 (ref 5–15)
BUN: 26 mg/dL — ABNORMAL HIGH (ref 6–20)
CO2: 29 mmol/L (ref 22–32)
Calcium: 9.1 mg/dL (ref 8.9–10.3)
Chloride: 98 mmol/L (ref 98–111)
Creatinine: 1.22 mg/dL (ref 0.61–1.24)
GFR, Estimated: >60 mL/min (ref >60)
Glucose, Bld: 150 mg/dL — ABNORMAL HIGH (ref 70–99)
Potassium: 4.2 mmol/L (ref 3.5–5.1)
Sodium: 136 mmol/L (ref 135–145)
Total Bilirubin: 2 mg/dL — ABNORMAL HIGH (ref 0.3–1.2)
Total Protein: 6.9 g/dL (ref 6.5–8.1)

## 2021-07-17 LAB — CBC WITH DIFFERENTIAL (CANCER CENTER ONLY)
Abs Immature Granulocytes: 0.03 10*3/uL (ref 0.00–0.07)
Basophils Absolute: 0 10*3/uL (ref 0.0–0.1)
Basophils Relative: 0 %
Eosinophils Absolute: 0.1 10*3/uL (ref 0.0–0.5)
Eosinophils Relative: 2 %
HCT: 37.2 % — ABNORMAL LOW (ref 39.0–52.0)
Hemoglobin: 11.8 g/dL — ABNORMAL LOW (ref 13.0–17.0)
Immature Granulocytes: 1 %
Lymphocytes Relative: 3 %
Lymphs Abs: 0.1 10*3/uL — ABNORMAL LOW (ref 0.7–4.0)
MCH: 25.8 pg — ABNORMAL LOW (ref 26.0–34.0)
MCHC: 31.7 g/dL (ref 30.0–36.0)
MCV: 81.4 fL (ref 80.0–100.0)
Monocytes Absolute: 0.9 10*3/uL (ref 0.1–1.0)
Monocytes Relative: 20 %
Neutro Abs: 3.4 10*3/uL (ref 1.7–7.7)
Neutrophils Relative %: 74 %
Platelet Count: 156 10*3/uL (ref 150–400)
RBC: 4.57 MIL/uL (ref 4.22–5.81)
RDW: 26.8 % — ABNORMAL HIGH (ref 11.5–15.5)
WBC Count: 4.6 10*3/uL (ref 4.0–10.5)
nRBC: 0.4 % — ABNORMAL HIGH (ref 0.0–0.2)

## 2021-07-17 MED ORDER — POLYETHYLENE GLYCOL 3350 17 G PO PACK: 17.0000 g | PACK | Freq: Every day | ORAL | 2 refills | Status: AC | PRN

## 2021-07-17 NOTE — Progress Notes (Signed)
  [facility] OFFICE PROGRESS NOTE   Diagnosis: Gastric cancer  INTERVAL HISTORY:   Mr. Stuber returns as scheduled.  He continues radiation/Xeloda.  He has occasional slight nausea.  No mouth sores.  No diarrhea.  He has been constipated.  Last bowel movement 3 days ago.  He plans to resume MiraLAX.  He reports continued numbness in the hands and feet.  Also dryness on the hands and feet.  Objective:  Vital signs in last 24 hours:  Blood pressure 91/79, pulse (!) 59, temperature 97.7 F (36.5 C), temperature source Temporal, resp. rate 20, weight 159 lb (72.1 kg), SpO2 100 %.    HEENT: Mild white coating over tongue.  No buccal thrush.  No ulcers. Resp: Lungs clear bilaterally. Cardio: Regular rate and rhythm. GI: Abdomen soft, tender mid upper abdomen. Vascular: No leg edema. Skin: Palms and soles are dry appearing.  No erythema. Port-A-Cath without erythema.   Lab Results:  Lab Results  Component Value Date   WBC 4.6 07/17/2021   HGB 11.8 (L) 07/17/2021   HCT 37.2 (L) 07/17/2021   MCV 81.4 07/17/2021   PLT 156 07/17/2021   NEUTROABS 3.4 07/17/2021    Imaging:  No results found.  Medications: I have reviewed the patient's current medications.  Assessment/Plan: Gastric cancer CT abdomen/pelvis 11/01/2020-masslike area measuring 6.3 x 5.6 x 5.6 cm in the antral prepyloric region of the stomach Upper endoscopy 11/05/2020-patchy white plaques in the distal esophagus; 3 nonbleeding cratered gastric ulcers with pigmented material in the gastric antrum largest 10 mm; segmental moderate inflammation characterized by congestion, erosions and erythema in the gastric antrum.  Biopsy of stomach ulcer-intramucosal adenocarcinoma arising in a background of intestinal metaplasia, HER-2 negative PET 11/2020-focal area of hypermetabolic activity at the gastric antrum, averages into the liver and small liver lesion adjacent to stomach not excluded, small chest lymph nodes  with mild increase in metabolic activity-nonspecific, symmetric parotid and extraocular muscle activity-likely physiologic Cycle 1 FOLFOX 12/12/2020 Cycle 2 FOLFOX 12/26/2020 Cycle 3 FOLFOX 01/08/2021 Cycle 4 FOLFOX 01/22/2021 Cycle 5 FOLFOX 02/05/2021-Udenyca Cycle 6 FOLFOX 02/19/2021-Udenyca CTs 02/20/2021-previously noted mass of the posterior gastric antrum no longer seen.  No lymphadenopathy or metastatic disease in the abdomen or pelvis. CT abdomen/pelvis 06/17/2021-haziness and question of wall thickening at the distal duodenum with free fluid in the upper abdomen MRI abdomen/MRCP 06/20/2021-diffuse gallbladder wall thickening, no evidence of mesenteric or retroperitoneal adenopathy, primary gastric mass not appreciated Upper endoscopy 06/20/2021-large amount of food residue in the gastric body, pylorus widely patent, no obvious tumor or ulceration, duodenum normal Radiation/Xeloda 06/25/2021 CHF, LVEF 20-25% 10/04/2020 CAD status post prior LAD stent COPD Chronic kidney disease Cocaine usage Port-A-Cath placement 12/06/2020, Interventional Radiology Mild neutropenia secondary to chemotherapy-Udenyca added with cycle 5 FOLFOX Hospital admission 05/13/2021- acute exacerbation of CHF/end-stage heart failure Hospital admission 06/17/2021-abdominal pain    Disposition: Tyler Wong appears stable.  He continues radiation/Xeloda.  He seems to be tolerating treatment well.  We reviewed the CBC from today.  Counts adequate to continue as above.  He is scheduled to complete the course of radiation 07/30/2021.  We will see him in follow-up around that time.  He will contact the office in the interim with any problems.    Ned Card ANP/GNP-BC   07/17/2021  11:21 AM

## 2021-07-17 NOTE — Progress Notes (Signed)
Provided patient with #2 pre-paid $25 Visa cards at visit today.

## 2021-07-18 ENCOUNTER — Ambulatory Visit
Admission: RE | Admit: 2021-07-18 | Discharge: 2021-07-18 | Disposition: A | Discharge status: Home / Self Care | Payer: Medicaid Other | Source: Ambulatory Visit | Attending: Radiation Oncology | Admitting: Radiation Oncology

## 2021-07-18 DIAGNOSIS — C163 Malignant neoplasm of pyloric antrum: Secondary | ICD-10-CM | POA: Diagnosis not present

## 2021-07-18 DIAGNOSIS — Z510 Encounter for antineoplastic radiation therapy: Secondary | ICD-10-CM | POA: Diagnosis not present

## 2021-07-18 DIAGNOSIS — R110 Nausea: Secondary | ICD-10-CM | POA: Diagnosis not present

## 2021-07-18 DIAGNOSIS — R200 Anesthesia of skin: Secondary | ICD-10-CM | POA: Diagnosis not present

## 2021-07-18 DIAGNOSIS — N189 Chronic kidney disease, unspecified: Secondary | ICD-10-CM | POA: Diagnosis not present

## 2021-07-21 ENCOUNTER — Other Ambulatory Visit: Payer: Self-pay

## 2021-07-21 ENCOUNTER — Inpatient Hospital Stay (INDEPENDENT_AMBULATORY_CARE_PROVIDER_SITE_OTHER): Payer: Medicaid Other | Admitting: Primary Care

## 2021-07-21 ENCOUNTER — Ambulatory Visit
Admission: RE | Admit: 2021-07-21 | Discharge: 2021-07-21 | Disposition: A | Discharge status: Home / Self Care | Payer: Medicaid Other | Source: Ambulatory Visit | Attending: Radiation Oncology | Admitting: Radiation Oncology

## 2021-07-21 DIAGNOSIS — R200 Anesthesia of skin: Secondary | ICD-10-CM | POA: Diagnosis not present

## 2021-07-21 DIAGNOSIS — Z510 Encounter for antineoplastic radiation therapy: Secondary | ICD-10-CM | POA: Diagnosis not present

## 2021-07-21 DIAGNOSIS — N189 Chronic kidney disease, unspecified: Secondary | ICD-10-CM | POA: Diagnosis not present

## 2021-07-21 DIAGNOSIS — C163 Malignant neoplasm of pyloric antrum: Secondary | ICD-10-CM | POA: Diagnosis not present

## 2021-07-21 DIAGNOSIS — R110 Nausea: Secondary | ICD-10-CM | POA: Diagnosis not present

## 2021-07-22 ENCOUNTER — Encounter (HOSPITAL_COMMUNITY): Payer: Self-pay

## 2021-07-22 ENCOUNTER — Ambulatory Visit
Admission: RE | Admit: 2021-07-22 | Discharge: 2021-07-22 | Disposition: A | Discharge status: Home / Self Care | Payer: Medicaid Other | Source: Ambulatory Visit | Attending: Radiation Oncology | Admitting: Radiation Oncology

## 2021-07-22 ENCOUNTER — Other Ambulatory Visit: Payer: Self-pay

## 2021-07-22 ENCOUNTER — Inpatient Hospital Stay (HOSPITAL_COMMUNITY)
Admission: EM | Admit: 2021-07-22 | Discharge: 2021-07-25 | DRG: 291 | Disposition: A | Discharge status: Home / Self Care | Payer: Medicaid Other | Attending: Internal Medicine | Admitting: Internal Medicine

## 2021-07-22 ENCOUNTER — Telehealth: Payer: Self-pay

## 2021-07-22 ENCOUNTER — Emergency Department (HOSPITAL_COMMUNITY): Payer: Medicaid Other

## 2021-07-22 DIAGNOSIS — I2510 Atherosclerotic heart disease of native coronary artery without angina pectoris: Secondary | ICD-10-CM | POA: Diagnosis present

## 2021-07-22 DIAGNOSIS — F1410 Cocaine abuse, uncomplicated: Secondary | ICD-10-CM | POA: Diagnosis present

## 2021-07-22 DIAGNOSIS — K219 Gastro-esophageal reflux disease without esophagitis: Secondary | ICD-10-CM | POA: Diagnosis present

## 2021-07-22 DIAGNOSIS — C169 Malignant neoplasm of stomach, unspecified: Secondary | ICD-10-CM | POA: Diagnosis present

## 2021-07-22 DIAGNOSIS — R079 Chest pain, unspecified: Secondary | ICD-10-CM | POA: Diagnosis not present

## 2021-07-22 DIAGNOSIS — D6959 Other secondary thrombocytopenia: Secondary | ICD-10-CM | POA: Diagnosis present

## 2021-07-22 DIAGNOSIS — I5084 End stage heart failure: Secondary | ICD-10-CM | POA: Diagnosis present

## 2021-07-22 DIAGNOSIS — N19 Unspecified kidney failure: Secondary | ICD-10-CM | POA: Diagnosis not present

## 2021-07-22 DIAGNOSIS — E875 Hyperkalemia: Secondary | ICD-10-CM | POA: Diagnosis present

## 2021-07-22 DIAGNOSIS — N179 Acute kidney failure, unspecified: Secondary | ICD-10-CM | POA: Diagnosis present

## 2021-07-22 DIAGNOSIS — Z20822 Contact with and (suspected) exposure to covid-19: Secondary | ICD-10-CM | POA: Diagnosis present

## 2021-07-22 DIAGNOSIS — R0789 Other chest pain: Secondary | ICD-10-CM | POA: Diagnosis not present

## 2021-07-22 DIAGNOSIS — E1122 Type 2 diabetes mellitus with diabetic chronic kidney disease: Secondary | ICD-10-CM | POA: Diagnosis present

## 2021-07-22 DIAGNOSIS — Z7982 Long term (current) use of aspirin: Secondary | ICD-10-CM

## 2021-07-22 DIAGNOSIS — N1832 Chronic kidney disease, stage 3b: Secondary | ICD-10-CM | POA: Diagnosis present

## 2021-07-22 DIAGNOSIS — E876 Hypokalemia: Secondary | ICD-10-CM | POA: Diagnosis present

## 2021-07-22 DIAGNOSIS — E872 Acidosis: Secondary | ICD-10-CM | POA: Diagnosis present

## 2021-07-22 DIAGNOSIS — Z9221 Personal history of antineoplastic chemotherapy: Secondary | ICD-10-CM

## 2021-07-22 DIAGNOSIS — C163 Malignant neoplasm of pyloric antrum: Secondary | ICD-10-CM | POA: Diagnosis not present

## 2021-07-22 DIAGNOSIS — K5900 Constipation, unspecified: Secondary | ICD-10-CM | POA: Diagnosis present

## 2021-07-22 DIAGNOSIS — Z955 Presence of coronary angioplasty implant and graft: Secondary | ICD-10-CM

## 2021-07-22 DIAGNOSIS — I5023 Acute on chronic systolic (congestive) heart failure: Secondary | ICD-10-CM | POA: Diagnosis present

## 2021-07-22 DIAGNOSIS — Z79899 Other long term (current) drug therapy: Secondary | ICD-10-CM

## 2021-07-22 DIAGNOSIS — I517 Cardiomegaly: Secondary | ICD-10-CM | POA: Diagnosis not present

## 2021-07-22 DIAGNOSIS — I255 Ischemic cardiomyopathy: Secondary | ICD-10-CM | POA: Diagnosis present

## 2021-07-22 DIAGNOSIS — R570 Cardiogenic shock: Secondary | ICD-10-CM | POA: Diagnosis present

## 2021-07-22 DIAGNOSIS — I130 Hypertensive heart and chronic kidney disease with heart failure and stage 1 through stage 4 chronic kidney disease, or unspecified chronic kidney disease: Principal | ICD-10-CM | POA: Diagnosis present

## 2021-07-22 DIAGNOSIS — J449 Chronic obstructive pulmonary disease, unspecified: Secondary | ICD-10-CM | POA: Diagnosis present

## 2021-07-22 DIAGNOSIS — Z8249 Family history of ischemic heart disease and other diseases of the circulatory system: Secondary | ICD-10-CM

## 2021-07-22 DIAGNOSIS — I5043 Acute on chronic combined systolic (congestive) and diastolic (congestive) heart failure: Principal | ICD-10-CM | POA: Diagnosis present

## 2021-07-22 DIAGNOSIS — I252 Old myocardial infarction: Secondary | ICD-10-CM

## 2021-07-22 DIAGNOSIS — F17210 Nicotine dependence, cigarettes, uncomplicated: Secondary | ICD-10-CM | POA: Diagnosis present

## 2021-07-22 DIAGNOSIS — Z923 Personal history of irradiation: Secondary | ICD-10-CM

## 2021-07-22 LAB — BASIC METABOLIC PANEL
Anion gap: 13 (ref 5–15)
BUN: 37 mg/dL — ABNORMAL HIGH (ref 6–20)
CO2: 22 mmol/L (ref 22–32)
Calcium: 9.6 mg/dL (ref 8.9–10.3)
Chloride: 95 mmol/L — ABNORMAL LOW (ref 98–111)
Creatinine, Ser: 2.09 mg/dL — ABNORMAL HIGH (ref 0.61–1.24)
GFR, Estimated: 37 mL/min — ABNORMAL LOW (ref >60)
Glucose, Bld: 158 mg/dL — ABNORMAL HIGH (ref 70–99)
Potassium: 5.8 mmol/L — ABNORMAL HIGH (ref 3.5–5.1)
Sodium: 130 mmol/L — ABNORMAL LOW (ref 135–145)

## 2021-07-22 LAB — LACTIC ACID, PLASMA: Lactic Acid, Venous: 4 mmol/L (ref 0.5–1.9)

## 2021-07-22 LAB — CBC
HCT: 37.4 % — ABNORMAL LOW (ref 39.0–52.0)
Hemoglobin: 12.1 g/dL — ABNORMAL LOW (ref 13.0–17.0)
MCH: 26.7 pg (ref 26.0–34.0)
MCHC: 32.4 g/dL (ref 30.0–36.0)
MCV: 82.4 fL (ref 80.0–100.0)
Platelets: 72 10*3/uL — ABNORMAL LOW (ref 150–400)
RBC: 4.54 MIL/uL (ref 4.22–5.81)
RDW: 28.1 % — ABNORMAL HIGH (ref 11.5–15.5)
WBC: 6.9 10*3/uL (ref 4.0–10.5)
nRBC: 2.5 % — ABNORMAL HIGH (ref 0.0–0.2)

## 2021-07-22 LAB — TROPONIN I (HIGH SENSITIVITY)
Troponin I (High Sensitivity): 175 ng/L (ref <18)
Troponin I (High Sensitivity): 208 ng/L (ref <18)

## 2021-07-22 LAB — BRAIN NATRIURETIC PEPTIDE: B Natriuretic Peptide: 2978.4 pg/mL — ABNORMAL HIGH (ref 0.0–100.0)

## 2021-07-22 MED ORDER — SODIUM CHLORIDE 0.9 % IV BOLUS
500.0000 mL | Freq: Once | INTRAVENOUS | Status: AC
  Administered 2021-07-22: 500 mL via INTRAVENOUS

## 2021-07-22 MED ORDER — FUROSEMIDE 10 MG/ML IJ SOLN
80.0000 mg | Freq: Once | INTRAMUSCULAR | Status: AC
  Administered 2021-07-22: 80 mg via INTRAVENOUS
  Filled 2021-07-22: qty 8

## 2021-07-22 MED ORDER — FUROSEMIDE 10 MG/ML IJ SOLN
20.0000 mg/h | INTRAVENOUS | Status: DC
  Administered 2021-07-23 – 2021-07-25 (×6): 20 mg/h via INTRAVENOUS
  Filled 2021-07-22: qty 20
  Filled 2021-07-22: qty 10
  Filled 2021-07-22 (×7): qty 20

## 2021-07-22 MED ORDER — FUROSEMIDE 10 MG/ML IJ SOLN
40.0000 mg | Freq: Once | INTRAMUSCULAR | Status: AC
  Administered 2021-07-22: 40 mg via INTRAVENOUS
  Filled 2021-07-22: qty 4

## 2021-07-22 NOTE — Telephone Encounter (Signed)
[facility] transportation arranged for treatments client has 2 appointments on 07-24-21 ,explained to client  ,he is okay  with appointments

## 2021-07-22 NOTE — ED Triage Notes (Signed)
Pt states he is here today due to cp,sob. Pt reports he lives in a nursing facility and that they are not dosing him correctly with his fluid pill. Pt reports he was at the cancer center for treatment and asked them to bring him over here

## 2021-07-22 NOTE — ED Provider Notes (Signed)
Emergency Medicine Provider Triage Evaluation Note  Tyler Wong , a 55 y.o. male  was evaluated in triage.  Pt complains of chest pain and shortness of breath x 5 days.  Symptoms have been progressively worsening.  Patient states he has a history of abdominal cancer and heart failure.  He recently moved into a nursing facility and he was not getting the correct dose of his fluid pill.  Patient states he is at the cancer center today for chemo and he was so short of breath he asked to be brought here to the ER for evaluation.  Review of Systems  Positive: Chest pain, shortness of breath, leg swelling Negative: Fever, chills, hemoptysis, hematemesis, nausea, vomiting  Physical Exam  BP 102/76   Pulse (!) 112   Temp 99.1 F (37.3 C)   Resp (!) 26   SpO2 100%  Gen:   Awake, no distress   Resp:  Patient is speaking in short sentences.  Oxygen saturation is 100% in room room air MSK:   Moves extremities without difficulty  Other:  Mild 1+ lower extremity edema. Tachycardic to Tyler Wong  Medically screening exam initiated at 3:03 PM.  Appropriate orders placed.  Tyler Wong was informed that the remainder of the evaluation will be completed by another provider, this initial triage assessment does not replace that evaluation, and the importance of remaining in the ED until their evaluation is complete.  Cardiac work-up including BNP ordered.  EKG and chest x-ray ordered as well.    Portions of this note were generated with Lobbyist. Dictation errors may occur despite best attempts at proofreading.    Tyler Folk, PA-C 07/22/21 1503    Lajean Saver, MD 07/24/21 1309

## 2021-07-22 NOTE — ED Notes (Signed)
Patient found standing at the sink asking for water, asked patient to get in the bed or at least sit on the side, he refused either. States he is here because the facility was not giving him his fluid pills like they should or the medication isn't strong enough. Patient is short of breath while speaking, blood pressure was low.Again asked patient to get into the bed. Patient sat down on the side of the bed.

## 2021-07-22 NOTE — Telephone Encounter (Signed)
Clients first week at assisted living 07-14-21 states it is okay he is adjusting. Nurse was out of town for a week ,will schedule transport for next week and get back with client

## 2021-07-22 NOTE — ED Provider Notes (Signed)
[facility] EMERGENCY DEPARTMENT Provider Note   CSN: [id number] Arrival date & time: 07/22/21  1411     History Chief Complaint  Patient presents with   Chest Pain    Devontaye Edghill is a 55 y.o. male.  HPI  55 year old chronically ill male with past medical history of CAD with previous STEMI and PCI, CHF, polysubstance abuse with specifically cocaine, COPD, DM, HTN presents from facility to the emergency department with worsening shortness of breath and fatigue.  Patient is currently being treated with chemotherapy/radiation for gastric cancer.  He has been experiencing worsening nausea/vomiting with decreased appetite secondary to that.  He just switched nursing facilities and states that they were incorrectly dosing his Lasix and since then he has been feeling worsening shortness of breath.  Denies any fever, productive cough, diarrhea, abdominal pain.  Patient was recently admitted and discharged about a month ago for admission of SIRS/sepsis, heart failure, hypoxic respiratory failure.  Past Medical History:  Diagnosis Date   CAD in native artery 07/17/2014   STEMI- LAD stenosis with DES   CHF (congestive heart failure) (HCC)    Cocaine abuse ([place])    COPD (chronic obstructive pulmonary disease) ([place])    Diabetes mellitus without complication ([place])    Dyspnea    Gastric cancer ([place]) 11/01/2020   GERD (gastroesophageal reflux disease)    Hypertension    ST elevation myocardial infarction (STEMI) involving left anterior descending (LAD) coronary artery with complication ([place])    Tobacco abuse 07/17/2014    Patient Active Problem List   Diagnosis Date Noted   Hyperkalemia 06/18/2021   Lactic acidosis 06/18/2021   RUQ abdominal pain    Acute acalculous cholecystitis 06/17/2021   Cocaine abuse ([place]) 06/17/2021   COPD (chronic obstructive pulmonary disease) ([place]) 06/17/2021   Esophageal candidiasis ([place]) 06/17/2021   Port-A-Cath in place 05/22/2021   Acute  dyspnea    CHF exacerbation ([place]) 05/13/2021   Gastric cancer ([place]) 11/29/2020   Abdominal pain, generalized 11/05/2020   Congestive heart failure (CHF) ([place]) 11/01/2020   CHF (congestive heart failure) ([place]) 10/31/2020   Palliative care by specialist    Encounter for laboratory testing for COVID-19 virus    Acute systolic HF (heart failure) ([place]) 10/03/2020   Acute on chronic systolic CHF (congestive heart failure) ([place]) 10/03/2020   Acute renal failure superimposed on stage 3b chronic kidney disease ([place])    Acute respiratory disease due to COVID-19 virus 09/20/2020   Acute respiratory failure due to COVID-19 ([place]) 09/19/2020   Cardiorenal syndrome 06/29/2020   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   Acute on chronic clinical systolic heart failure ([place]) 06/22/2020   Acute on chronic systolic (congestive) heart failure ([place]) 06/22/2020   Palliative care encounter    Cardiogenic shock ([place]) 04/11/2020   CKD (chronic kidney disease) stage 3, GFR 30-59 ml/min (HCC) 04/11/2020   Symptomatic anemia 04/09/2020   Acute on chronic HFrEF (heart failure with reduced ejection fraction) ([place]) 05/05/2019   HFrEF (heart failure with reduced ejection fraction) ([employer]) 05/04/2019   Acute combined systolic and diastolic congestive heart failure ([place]) 10/02/2018   Hepatitis C 04/13/2017   Current non-adherence to medical treatment 04/13/2017   Acute on chronic combined systolic (congestive) and diastolic (congestive) heart failure ([place]) 04/13/2017   HTN (hypertension) 04/12/2017   Insomnia 01/18/2017   Chronic combined systolic and diastolic congestive heart failure ([place]) 01/04/2017   AKI (acute kidney injury) ([place]) 01/04/2017   Hyperlipidemia 01/04/2017   Chest pain 12/30/2016  Coronary artery disease involving native coronary artery of native heart without angina pectoris 07/18/2014   Tobacco abuse 07/17/2014   Cocaine use 07/17/2014    Past Surgical History:  Procedure  Laterality Date   BIOPSY  11/05/2020   Procedure: BIOPSY;  Surgeon: Wilford Corner, MD;  Location: [facility];  Service: Endoscopy;;   BIOPSY  06/20/2021   Procedure: BIOPSY;  Surgeon: Ronald Lobo, MD;  Location: [facility];  Service: Endoscopy;;   CORONARY ANGIOPLASTY WITH STENT PLACEMENT  07/18/14   resolute DES to LAD STEMI   ESOPHAGOGASTRODUODENOSCOPY N/A 11/05/2020   Procedure: ESOPHAGOGASTRODUODENOSCOPY (EGD);  Surgeon: Wilford Corner, MD;  Location: [facility];  Service: Endoscopy;  Laterality: N/A;   ESOPHAGOGASTRODUODENOSCOPY N/A 06/20/2021   Procedure: ESOPHAGOGASTRODUODENOSCOPY (EGD);  Surgeon: Ronald Lobo, MD;  Location[facility] ENDOSCOPY;  Service: Endoscopy;  Laterality: N/A;   IR IMAGING GUIDED PORT INSERTION  12/06/2020   LEFT HEART CATH N/A 07/17/2014   Procedure: LEFT HEART CATH;  Surgeon: [name], MD;  Location: [facility] CATH LAB;  Service: Cardiovascular;  Laterality: N/A;   PERCUTANEOUS CORONARY STENT INTERVENTION (PCI-S)  07/17/2014   Procedure: PERCUTANEOUS CORONARY STENT INTERVENTION (PCI-S);  Surgeon: [name], MD;  Location: [facility] CATH LAB;  Service: Cardiovascular;;   RIGHT/LEFT HEART CATH AND CORONARY ANGIOGRAPHY N/A 10/03/2018   Procedure: RIGHT/LEFT HEART CATH AND CORONARY ANGIOGRAPHY;  Surgeon: [name], MD;  Location: [facility] CV LAB;  Service: Cardiovascular;  Laterality: N/A;       Family History  Problem Relation Age of Onset   Cirrhosis Father    Heart attack Mother    Heart attack Sister    Heart failure Sister    Heart attack Brother     Social History   Tobacco Use   Smoking status: Every Day    Packs/day: 0.25    Years: 30.00    Pack years: 7.50    Types: Cigarettes   Smokeless tobacco: Never   Tobacco comments:    .5 PK PER DAY  Vaping Use   Vaping Use: Never used  Substance Use Topics   Alcohol use: No    Comment: Patient denies abuse, states social drinker   Drug use: Yes    Types: Cocaine, "Crack"  cocaine    Comment: heroin    Home Medications Prior to Admission medications   Medication Sig Start Date End Date Taking? Authorizing Provider  ASPIRIN LOW DOSE 81 MG EC tablet TAKE 1 TABLET (81 MG TOTAL) BY MOUTH DAILY (MORNING) 03/05/21   Bensimhon, Shaune Pascal, MD  atorvastatin (LIPITOR) 80 MG tablet Take 1 tablet (80 mg total) by mouth daily. 11/26/20   [name], MD  capecitabine (XELODA) 500 MG tablet Take 3 tablets (1,500 mg total) by mouth 2 (two) times daily after a meal. Take Monday-Friday. Take only on days of radiation. 06/24/21   Ladell Pier, MD  empagliflozin (JARDIANCE) 10 MG TABS tablet Take 1 tablet (10 mg total) by mouth daily. 11/26/20   [name], MD  gabapentin (NEURONTIN) 100 MG capsule Take 1 capsule (100 mg total) by mouth 2 (two) times daily. 05/21/21   Nita Sells, MD  metoCLOPramide (REGLAN) 5 MG tablet Take 1 tablet (5 mg total) by mouth every 8 (eight) hours as needed for nausea or vomiting. 06/23/21 06/23/22  Cristal Ford, DO  Multiple Vitamins-Minerals (MULTIVITAMIN WITH MINERALS) tablet Take 1 tablet by mouth daily.    [provider]  nicotine (NICODERM CQ - DOSED IN MG/24 HR) 7  mg/24hr patch Place 1 patch (7 mg total) onto the skin daily. 05/22/21   Nita Sells, MD  pantoprazole (PROTONIX) 40 MG tablet Take 1 tablet (40 mg total) by mouth 2 (two) times daily. 05/22/21 07/04/22  Ladell Pier, MD  polyethylene glycol (MIRALAX) 17 g packet Take 17 g by mouth daily as needed. Patient taking differently: Take 17 g by mouth daily. 07/17/21   Owens Shark, NP  potassium chloride SA (KLOR-CON) 20 MEQ tablet Take 2 tablets (40 mEq total) by mouth daily. Patient taking differently: Take 20 mEq by mouth 2 (two) times daily. 01/20/21   [name], MD  PROAIR HFA 108 [phone] Base) MCG/ACT inhaler INHALE 2 PUFFS INTO THE LUNGS EVERY 4 (FOUR) HOURS AS NEEDED FOR WHEEZING OR SHORTNESS OF BREATH. 07/04/21   Mayers, Cari S, PA-C   Torsemide 40 MG TABS Take 80 mg by mouth 2 (two) times daily. 06/27/21   Rafael Bihari, FNP    Allergies    Patient has no known allergies.  Review of Systems   Review of Systems  Constitutional:  Positive for appetite change and fatigue. Negative for chills and fever.  HENT:  Negative for congestion.   Eyes:  Negative for visual disturbance.  Respiratory:  Positive for chest tightness and shortness of breath.   Cardiovascular:  Positive for chest pain. Negative for palpitations and leg swelling.  Gastrointestinal:  Positive for nausea and vomiting. Negative for abdominal pain and diarrhea.  Genitourinary:  Negative for dysuria.  Musculoskeletal:  Negative for back pain.  Skin:  Negative for rash.  Neurological:  Negative for headaches.   Physical Exam Updated Vital Signs BP 99/86   Pulse (!) 113   Temp (!) 97.4 F (36.3 C) (Oral)   Resp 19   SpO2 99%   Physical Exam Vitals and nursing note reviewed.  Constitutional:      Appearance: Normal appearance. He is ill-appearing. He is not toxic-appearing or diaphoretic.  HENT:     Head: Normocephalic.     Mouth/Throat:     Mouth: Mucous membranes are moist.  Cardiovascular:     Rate and Rhythm: Normal rate.  Pulmonary:     Effort: Pulmonary effort is normal. No respiratory distress.     Breath sounds: Examination of the right-lower field reveals rales. Examination of the left-lower field reveals rales. Rales present.  Chest:     Chest wall: No tenderness.  Abdominal:     Palpations: Abdomen is soft.     Tenderness: There is no abdominal tenderness.  Musculoskeletal:     Right lower leg: No edema.     Left lower leg: No edema.  Skin:    General: Skin is warm.  Neurological:     Mental Status: He is alert and oriented to person, place, and time. Mental status is at baseline.  Psychiatric:        Mood and Affect: Mood normal.    ED Results / Procedures / Treatments   Labs (all labs ordered are listed, but only  abnormal results are displayed) Labs Reviewed  BASIC METABOLIC PANEL - Abnormal; Notable for the following components:      Result Value   Sodium 130 (*)    Potassium 5.8 (*)    Chloride 95 (*)    Glucose, Bld 158 (*)    BUN 37 (*)    Creatinine, Ser 2.09 (*)    GFR, Estimated 37 (*)    All other components within normal limits  CBC - Abnormal; Notable for the following components:   Hemoglobin 12.1 (*)    HCT 37.4 (*)    RDW 28.1 (*)    Platelets 72 (*)    nRBC 2.5 (*)    All other components within normal limits  BRAIN NATRIURETIC PEPTIDE - Abnormal; Notable for the following components:   B Natriuretic Peptide 2,978.4 (*)    All other components within normal limits  TROPONIN I (HIGH SENSITIVITY) - Abnormal; Notable for the following components:   Troponin I (High Sensitivity) 175 (*)    All other components within normal limits  TROPONIN I (HIGH SENSITIVITY) - Abnormal; Notable for the following components:   Troponin I (High Sensitivity) 208 (*)    All other components within normal limits  LACTIC ACID, PLASMA  LACTIC ACID, PLASMA  RAPID URINE DRUG SCREEN, HOSP PERFORMED    EKG EKG Interpretation  Date/Time:  Tuesday July 22 2021 14:46:14 EDT Ventricular Rate:  115 PR Interval:  186 QRS Duration: 100 QT Interval:  328 QTC Calculation: 453 R Axis:   -79 Text Interpretation: Sinus tachycardia Biatrial enlargement Left axis deviation Minimal voltage criteria for LVH, may be normal variant ( Cornell product ) Inferior infarct , age undetermined Anterior infarct , age undetermined Abnormal ECG Sinus tachycardia, unchanged from previous Confirmed by Lavenia Atlas [phone]) on 07/22/2021 9:12:50 PM  Radiology DG Chest 2 View  Result Date: 07/22/2021 CLINICAL DATA:  55 year old male with chest pain. EXAM: CHEST - 2 VIEW COMPARISON:  Chest radiograph dated 06/21/2021. FINDINGS: Left-sided Port-A-Cath with tip close to the cavoatrial junction. There is cardiomegaly with mild  vascular congestion. No focal consolidation, pleural effusion, or pneumothorax. No acute osseous pathology. IMPRESSION: Cardiomegaly with mild vascular congestion. No focal consolidation. Electronically Signed   By: Anner Crete M.D.   On: 07/22/2021 15:41    Procedures Procedures   Medications Ordered in ED Medications  furosemide (LASIX) injection 80 mg (has no administration in time range)  furosemide (LASIX) injection 40 mg (40 mg Intravenous Given 07/22/21 2206)  sodium chloride 0.9 % bolus 500 mL (500 mLs Intravenous New Bag/Given 07/22/21 2206)    ED Course  I have reviewed the triage vital signs and the nursing notes.  Pertinent labs & imaging results that were available during my care of the patient were reviewed by me and considered in my medical decision making (see chart for details).    MDM Rules/Calculators/A&P                           55 year old male presents the emergency department with worsening shortness of breath.  Of note he was recently admitted for SIRS/sepsis in the setting of heart failure and respiratory failure with hypoxia.  Patient was tachycardic on arrival, intermittently tachypneic but in no respiratory distress.  He has rales in the lower lung fields.  EKG shows sinus tachycardia without any new ischemic changes.  Most recent echo shows EF of 20 to 25%.  Blood work shows stable anemia, worsening kidney dysfunction with a potassium of 5.8 and a sodium of 130.  Initial troponin is 175 trending up to 208, currently no active chest pain, these are lower than his previous troponins from his most recent admission a month ago.  BNP is on the higher normal for him.  Chest x-ray shows fluid overload.  Plan for diuresis in regards to the fluid overload and elevated potassium.  Spoke with on-call cardiology fellow Dr. Renella Cunas who  will come evaluate the patient.  He has requested a lactic acid to help determine if this patient will need cardiology admission versus  medical.  Lactic acid is 4, cardiology is going to come evaluate the patient to determine the admitting team.  Patient pending admission.  Final Clinical Impression(s) / ED Diagnoses Final diagnoses:  None    Rx / DC Orders ED Discharge Orders     None        Lorelle Gibbs, DO 07/22/21 2357

## 2021-07-22 NOTE — Telephone Encounter (Signed)
Texted client appointment pick up times for the week after arranged through [facility],client responded ok

## 2021-07-22 NOTE — Telephone Encounter (Signed)
Transportation services arranged for treatment Services

## 2021-07-23 ENCOUNTER — Ambulatory Visit: Payer: Medicaid Other

## 2021-07-23 ENCOUNTER — Encounter (HOSPITAL_COMMUNITY): Payer: Self-pay | Admitting: Internal Medicine

## 2021-07-23 ENCOUNTER — Telehealth: Payer: Self-pay

## 2021-07-23 ENCOUNTER — Inpatient Hospital Stay (HOSPITAL_COMMUNITY): Payer: Medicaid Other

## 2021-07-23 DIAGNOSIS — E872 Acidosis: Secondary | ICD-10-CM | POA: Diagnosis present

## 2021-07-23 DIAGNOSIS — Z79899 Other long term (current) drug therapy: Secondary | ICD-10-CM | POA: Diagnosis not present

## 2021-07-23 DIAGNOSIS — I517 Cardiomegaly: Secondary | ICD-10-CM | POA: Diagnosis not present

## 2021-07-23 DIAGNOSIS — C169 Malignant neoplasm of stomach, unspecified: Secondary | ICD-10-CM | POA: Diagnosis present

## 2021-07-23 DIAGNOSIS — E876 Hypokalemia: Secondary | ICD-10-CM | POA: Diagnosis present

## 2021-07-23 DIAGNOSIS — I5084 End stage heart failure: Secondary | ICD-10-CM | POA: Diagnosis present

## 2021-07-23 DIAGNOSIS — R570 Cardiogenic shock: Secondary | ICD-10-CM | POA: Diagnosis not present

## 2021-07-23 DIAGNOSIS — N179 Acute kidney failure, unspecified: Secondary | ICD-10-CM | POA: Diagnosis present

## 2021-07-23 DIAGNOSIS — J449 Chronic obstructive pulmonary disease, unspecified: Secondary | ICD-10-CM

## 2021-07-23 DIAGNOSIS — I5023 Acute on chronic systolic (congestive) heart failure: Secondary | ICD-10-CM | POA: Diagnosis not present

## 2021-07-23 DIAGNOSIS — Z8249 Family history of ischemic heart disease and other diseases of the circulatory system: Secondary | ICD-10-CM | POA: Diagnosis not present

## 2021-07-23 DIAGNOSIS — Z955 Presence of coronary angioplasty implant and graft: Secondary | ICD-10-CM | POA: Diagnosis not present

## 2021-07-23 DIAGNOSIS — E875 Hyperkalemia: Secondary | ICD-10-CM | POA: Diagnosis present

## 2021-07-23 DIAGNOSIS — I5043 Acute on chronic combined systolic (congestive) and diastolic (congestive) heart failure: Secondary | ICD-10-CM | POA: Diagnosis not present

## 2021-07-23 DIAGNOSIS — J410 Simple chronic bronchitis: Secondary | ICD-10-CM | POA: Diagnosis not present

## 2021-07-23 DIAGNOSIS — N1832 Chronic kidney disease, stage 3b: Secondary | ICD-10-CM | POA: Diagnosis present

## 2021-07-23 DIAGNOSIS — I2510 Atherosclerotic heart disease of native coronary artery without angina pectoris: Secondary | ICD-10-CM | POA: Diagnosis present

## 2021-07-23 DIAGNOSIS — F17210 Nicotine dependence, cigarettes, uncomplicated: Secondary | ICD-10-CM | POA: Diagnosis present

## 2021-07-23 DIAGNOSIS — C163 Malignant neoplasm of pyloric antrum: Secondary | ICD-10-CM | POA: Diagnosis not present

## 2021-07-23 DIAGNOSIS — N19 Unspecified kidney failure: Secondary | ICD-10-CM | POA: Diagnosis not present

## 2021-07-23 DIAGNOSIS — K219 Gastro-esophageal reflux disease without esophagitis: Secondary | ICD-10-CM | POA: Diagnosis present

## 2021-07-23 DIAGNOSIS — R0789 Other chest pain: Secondary | ICD-10-CM | POA: Diagnosis not present

## 2021-07-23 DIAGNOSIS — I130 Hypertensive heart and chronic kidney disease with heart failure and stage 1 through stage 4 chronic kidney disease, or unspecified chronic kidney disease: Secondary | ICD-10-CM | POA: Diagnosis present

## 2021-07-23 DIAGNOSIS — I509 Heart failure, unspecified: Secondary | ICD-10-CM | POA: Diagnosis not present

## 2021-07-23 DIAGNOSIS — Z515 Encounter for palliative care: Secondary | ICD-10-CM | POA: Diagnosis not present

## 2021-07-23 DIAGNOSIS — D6959 Other secondary thrombocytopenia: Secondary | ICD-10-CM | POA: Diagnosis present

## 2021-07-23 DIAGNOSIS — E1122 Type 2 diabetes mellitus with diabetic chronic kidney disease: Secondary | ICD-10-CM | POA: Diagnosis present

## 2021-07-23 DIAGNOSIS — Z20822 Contact with and (suspected) exposure to covid-19: Secondary | ICD-10-CM | POA: Diagnosis present

## 2021-07-23 DIAGNOSIS — I252 Old myocardial infarction: Secondary | ICD-10-CM | POA: Diagnosis not present

## 2021-07-23 DIAGNOSIS — Z7982 Long term (current) use of aspirin: Secondary | ICD-10-CM | POA: Diagnosis not present

## 2021-07-23 DIAGNOSIS — R079 Chest pain, unspecified: Secondary | ICD-10-CM | POA: Diagnosis not present

## 2021-07-23 DIAGNOSIS — Z7189 Other specified counseling: Secondary | ICD-10-CM | POA: Diagnosis not present

## 2021-07-23 DIAGNOSIS — K5900 Constipation, unspecified: Secondary | ICD-10-CM | POA: Diagnosis present

## 2021-07-23 DIAGNOSIS — N171 Acute kidney failure with acute cortical necrosis: Secondary | ICD-10-CM | POA: Diagnosis not present

## 2021-07-23 DIAGNOSIS — N170 Acute kidney failure with tubular necrosis: Secondary | ICD-10-CM | POA: Diagnosis not present

## 2021-07-23 DIAGNOSIS — F1410 Cocaine abuse, uncomplicated: Secondary | ICD-10-CM | POA: Diagnosis present

## 2021-07-23 LAB — LACTIC ACID, PLASMA
Lactic Acid, Venous: 3.2 mmol/L (ref 0.5–1.9)
Lactic Acid, Venous: 2.3 mmol/L (ref 0.5–1.9)

## 2021-07-23 LAB — GLUCOSE, CAPILLARY
Glucose-Capillary: 121 mg/dL — ABNORMAL HIGH (ref 70–99)
Glucose-Capillary: 158 mg/dL — ABNORMAL HIGH (ref 70–99)

## 2021-07-23 LAB — CBC WITH DIFFERENTIAL/PLATELET
Abs Immature Granulocytes: 0.07 10*3/uL (ref 0.00–0.07)
Basophils Absolute: 0 10*3/uL (ref 0.0–0.1)
Basophils Relative: 0 %
Eosinophils Absolute: 0 10*3/uL (ref 0.0–0.5)
Eosinophils Relative: 0 %
HCT: 33.7 % — ABNORMAL LOW (ref 39.0–52.0)
Hemoglobin: 11.1 g/dL — ABNORMAL LOW (ref 13.0–17.0)
Immature Granulocytes: 1 %
Lymphocytes Relative: 2 %
Lymphs Abs: 0.1 10*3/uL — ABNORMAL LOW (ref 0.7–4.0)
MCH: 26.9 pg (ref 26.0–34.0)
MCHC: 32.9 g/dL (ref 30.0–36.0)
MCV: 81.8 fL (ref 80.0–100.0)
Monocytes Absolute: 0.9 10*3/uL (ref 0.1–1.0)
Monocytes Relative: 16 %
Neutro Abs: 4.8 10*3/uL (ref 1.7–7.7)
Neutrophils Relative %: 81 %
Platelets: 68 10*3/uL — ABNORMAL LOW (ref 150–400)
RBC: 4.12 MIL/uL — ABNORMAL LOW (ref 4.22–5.81)
RDW: 28.1 % — ABNORMAL HIGH (ref 11.5–15.5)
Smear Review: DECREASED
WBC: 6 10*3/uL (ref 4.0–10.5)
nRBC: 1.8 % — ABNORMAL HIGH (ref 0.0–0.2)

## 2021-07-23 LAB — COMPREHENSIVE METABOLIC PANEL
ALT: 18 U/L (ref 0–44)
AST: 23 U/L (ref 15–41)
Albumin: 3.6 g/dL (ref 3.5–5.0)
Alkaline Phosphatase: 89 U/L (ref 38–126)
Anion gap: 12 (ref 5–15)
BUN: 36 mg/dL — ABNORMAL HIGH (ref 6–20)
CO2: 22 mmol/L (ref 22–32)
Calcium: 9.2 mg/dL (ref 8.9–10.3)
Chloride: 98 mmol/L (ref 98–111)
Creatinine, Ser: 1.88 mg/dL — ABNORMAL HIGH (ref 0.61–1.24)
GFR, Estimated: 42 mL/min — ABNORMAL LOW (ref >60)
Glucose, Bld: 170 mg/dL — ABNORMAL HIGH (ref 70–99)
Potassium: 4.5 mmol/L (ref 3.5–5.1)
Sodium: 132 mmol/L — ABNORMAL LOW (ref 135–145)
Total Bilirubin: 4.1 mg/dL — ABNORMAL HIGH (ref 0.3–1.2)
Total Protein: 7 g/dL (ref 6.5–8.1)

## 2021-07-23 LAB — RESP PANEL BY RT-PCR (FLU A&B, COVID) ARPGX2
Influenza A by PCR: NEGATIVE
Influenza B by PCR: NEGATIVE
SARS Coronavirus 2 by RT PCR: NEGATIVE

## 2021-07-23 LAB — RAPID URINE DRUG SCREEN, HOSP PERFORMED
Amphetamines: NOT DETECTED
Barbiturates: NOT DETECTED
Benzodiazepines: NOT DETECTED
Cocaine: NOT DETECTED
Opiates: NOT DETECTED
Tetrahydrocannabinol: POSITIVE — AB

## 2021-07-23 LAB — SODIUM, URINE, RANDOM: Sodium, Ur: 29 mmol/L

## 2021-07-23 LAB — CBG MONITORING, ED
Glucose-Capillary: 130 mg/dL — ABNORMAL HIGH (ref 70–99)
Glucose-Capillary: 147 mg/dL — ABNORMAL HIGH (ref 70–99)

## 2021-07-23 LAB — HEMOGLOBIN A1C
Hgb A1c MFr Bld: 6.8 % — ABNORMAL HIGH (ref 4.8–5.6)
Mean Plasma Glucose: 148 mg/dL

## 2021-07-23 LAB — MAGNESIUM: Magnesium: 2.5 mg/dL — ABNORMAL HIGH (ref 1.7–2.4)

## 2021-07-23 MED ORDER — ATORVASTATIN CALCIUM 40 MG PO TABS
80.0000 mg | ORAL_TABLET | Freq: Every day | ORAL | Status: DC
  Administered 2021-07-23 – 2021-07-25 (×3): 80 mg via ORAL
  Filled 2021-07-23 (×3): qty 2

## 2021-07-23 MED ORDER — SODIUM ZIRCONIUM CYCLOSILICATE 10 G PO PACK
10.0000 g | PACK | Freq: Once | ORAL | Status: AC
  Administered 2021-07-23: 10 g via ORAL
  Filled 2021-07-23: qty 1

## 2021-07-23 MED ORDER — PANTOPRAZOLE SODIUM 40 MG PO TBEC
40.0000 mg | DELAYED_RELEASE_TABLET | Freq: Two times a day (BID) | ORAL | Status: DC
  Administered 2021-07-23 – 2021-07-25 (×5): 40 mg via ORAL
  Filled 2021-07-23 (×5): qty 1

## 2021-07-23 MED ORDER — ACETAMINOPHEN 325 MG PO TABS: 650.0000 mg | ORAL_TABLET | Freq: Four times a day (QID) | ORAL | Status: DC | PRN

## 2021-07-23 MED ORDER — ONDANSETRON HCL 4 MG PO TABS: 4.0000 mg | ORAL_TABLET | Freq: Four times a day (QID) | ORAL | Status: DC | PRN

## 2021-07-23 MED ORDER — CAPECITABINE 500 MG PO TABS
1500.0000 mg | ORAL_TABLET | Freq: Two times a day (BID) | ORAL | Status: DC
Start: 2021-07-23 — End: 2021-07-23

## 2021-07-23 MED ORDER — ASPIRIN 81 MG PO CHEW
81.0000 mg | CHEWABLE_TABLET | Freq: Every morning | ORAL | Status: DC
  Administered 2021-07-23 – 2021-07-25 (×3): 81 mg via ORAL
  Filled 2021-07-23 (×3): qty 1

## 2021-07-23 MED ORDER — GABAPENTIN 100 MG PO CAPS
100.0000 mg | ORAL_CAPSULE | Freq: Two times a day (BID) | ORAL | Status: DC
  Administered 2021-07-23 – 2021-07-25 (×5): 100 mg via ORAL
  Filled 2021-07-23 (×5): qty 1

## 2021-07-23 MED ORDER — POLYETHYLENE GLYCOL 3350 17 G PO PACK
17.0000 g | PACK | Freq: Every day | ORAL | Status: DC | PRN
  Administered 2021-07-24 – 2021-07-25 (×2): 17 g via ORAL
  Filled 2021-07-23 (×2): qty 1

## 2021-07-23 MED ORDER — METOCLOPRAMIDE HCL 5 MG PO TABS
5.0000 mg | ORAL_TABLET | Freq: Three times a day (TID) | ORAL | Status: DC
  Administered 2021-07-23 – 2021-07-25 (×7): 5 mg via ORAL
  Filled 2021-07-23 (×7): qty 1

## 2021-07-23 MED ORDER — INSULIN ASPART 100 UNIT/ML IJ SOLN
0.0000 [IU] | Freq: Three times a day (TID) | INTRAMUSCULAR | Status: DC
  Administered 2021-07-23: 3 [IU] via SUBCUTANEOUS
  Administered 2021-07-23 – 2021-07-24 (×3): 2 [IU] via SUBCUTANEOUS

## 2021-07-23 MED ORDER — ONDANSETRON HCL 4 MG/2ML IJ SOLN: 4.0000 mg | Freq: Four times a day (QID) | INTRAMUSCULAR | Status: DC | PRN

## 2021-07-23 MED ORDER — ACETAMINOPHEN 650 MG RE SUPP: 650.0000 mg | Freq: Four times a day (QID) | RECTAL | Status: DC | PRN

## 2021-07-23 MED ORDER — ENOXAPARIN SODIUM 40 MG/0.4ML IJ SOSY
40.0000 mg | PREFILLED_SYRINGE | INTRAMUSCULAR | Status: DC
  Administered 2021-07-23 – 2021-07-25 (×3): 40 mg via SUBCUTANEOUS
  Filled 2021-07-23 (×3): qty 0.4

## 2021-07-23 NOTE — H&P (Signed)
History and Physical    Tyler Wong [id number] DOB: [birth year] DOA: 07/22/2021  PCP: [name], NP  Patient coming from: ALF   Chief Complaint:  Chief Complaint  Patient presents with   Chest Pain     HPI:    55 year old male with past medical history of gastric cancer (Dx 10/2020), chronic kidney disease stage IIIb, advanced cardiomyopathy with systolic congestive heart failure Echo 09/2020 EF 20-25%), coronary artery disease (STEMI 2015 with DES to LAD), substance abuse (cocaine), COPD, gastroesophageal reflux disease who presents [facility] emergency department with complaints of shortness of breath.    Patient explains that for approximately the past week he has been experiencing increasing shortness of breath beyond his baseline.  Shortness of breath is worse with exertion and improved with rest.  Patient is also complaining of associated paroxysmal nocturnal dyspnea and pillow orthopnea.  Patient also complains of some bilateral lower extremity edema and increasing abdominal girth that has progressed over the same period of time.  Patient does complain of some vague intermittent atypical chest discomfort in the form of midsternal burning as well.  Patient states that he does not regularly weigh himself.  Patient states that he attempts to maintain a low-sodium diet.  Patient states that he has been abstinent of cocaine for approximately 1 month.  Patient explains that at his assisted living facility he suspects that the staff has been providing him with less torsemide than he is supposed to be prescribed.  He states that he caught them giving him a smaller dose of torsemide the other evening than what he has been usually prescribed.  Due to patient's progressively worsening shortness of breath patient eventually presented to [facility] emergency department for evaluation.    Review of Systems:   Review of Systems  Constitutional:  Positive for  malaise/fatigue.  Respiratory:  Positive for shortness of breath.   Cardiovascular:  Positive for chest pain, orthopnea, leg swelling and PND.  Neurological:  Positive for weakness.  All other systems reviewed and are negative.  Past Medical History:  Diagnosis Date   CAD in native artery 07/17/2014   STEMI- LAD stenosis with DES   CHF (congestive heart failure) (HCC)    Cocaine abuse ([place])    COPD (chronic obstructive pulmonary disease) ([place])    Diabetes mellitus without complication ([place])    Dyspnea    Gastric cancer ([place]) 11/01/2020   GERD (gastroesophageal reflux disease)    Hypertension    ST elevation myocardial infarction (STEMI) involving left anterior descending (LAD) coronary artery with complication ([place])    Tobacco abuse 07/17/2014    Past Surgical History:  Procedure Laterality Date   BIOPSY  11/05/2020   Procedure: BIOPSY;  Surgeon: Wilford Corner, MD;  Location: [place];  Service: Endoscopy;;   BIOPSY  06/20/2021   Procedure: BIOPSY;  Surgeon: Ronald Lobo, MD;  Location: [place];  Service: Endoscopy;;   CORONARY ANGIOPLASTY WITH STENT PLACEMENT  07/18/14   resolute DES to LAD STEMI   ESOPHAGOGASTRODUODENOSCOPY N/A 11/05/2020   Procedure: ESOPHAGOGASTRODUODENOSCOPY (EGD);  Surgeon: Wilford Corner, MD;  Location: [place];  Service: Endoscopy;  Laterality: N/A;   ESOPHAGOGASTRODUODENOSCOPY N/A 06/20/2021   Procedure: ESOPHAGOGASTRODUODENOSCOPY (EGD);  Surgeon: Ronald Lobo, MD;  Location: [facility] ENDOSCOPY;  Service: Endoscopy;  Laterality: N/A;   IR IMAGING GUIDED PORT INSERTION  12/06/2020   LEFT HEART CATH N/A 07/17/2014   Procedure: LEFT HEART CATH;  Surgeon: [name], MD;  Location: [facility] CATH LAB;  Service: Cardiovascular;  Laterality: N/A;   PERCUTANEOUS CORONARY STENT INTERVENTION (PCI-S)  07/17/2014   Procedure: PERCUTANEOUS CORONARY STENT INTERVENTION (PCI-S);  Surgeon: [name], MD;  Location: [facility] CATH LAB;  Service:  Cardiovascular;;   RIGHT/LEFT HEART CATH AND CORONARY ANGIOGRAPHY N/A 10/03/2018   Procedure: RIGHT/LEFT HEART CATH AND CORONARY ANGIOGRAPHY;  Surgeon: [name], MD;  Location: [place] CV LAB;  Service: Cardiovascular;  Laterality: N/A;     reports that he has been smoking cigarettes. He has a 7.50 pack-year smoking history. He has never used smokeless tobacco. He reports current drug use. Drugs: Cocaine and "Crack" cocaine. He reports that he does not drink alcohol.  No Known Allergies  Family History  Problem Relation Age of Onset   Cirrhosis Father    Heart attack Mother    Heart attack Sister    Heart failure Sister    Heart attack Brother      Prior to Admission medications   Medication Sig Start Date End Date Taking? Authorizing Provider  ASPIRIN LOW DOSE 81 MG EC tablet TAKE 1 TABLET (81 MG TOTAL) BY MOUTH DAILY (MORNING) Patient taking differently: Take 81 mg by mouth every morning. 03/05/21  Yes Bensimhon, Shaune Pascal, MD  atorvastatin (LIPITOR) 80 MG tablet Take 1 tablet (80 mg total) by mouth daily. 11/26/20  Yes [name], MD  capecitabine (XELODA) 500 MG tablet Take 3 tablets (1,500 mg total) by mouth 2 (two) times daily after a meal. Take Monday-Friday. Take only on days of radiation. 06/24/21  Yes Ladell Pier, MD  empagliflozin (JARDIANCE) 10 MG TABS tablet Take 1 tablet (10 mg total) by mouth daily. 11/26/20  Yes [name], MD  gabapentin (NEURONTIN) 100 MG capsule Take 1 capsule (100 mg total) by mouth 2 (two) times daily. 05/21/21  Yes Nita Sells, MD  metoCLOPramide (REGLAN) 5 MG tablet Take 1 tablet (5 mg total) by mouth every 8 (eight) hours as needed for nausea or vomiting. 06/23/21 06/23/22 Yes Mikhail, Velta Addison, DO  Multiple Vitamins-Minerals (MULTIVITAMIN WITH MINERALS) tablet Take 1 tablet by mouth daily.   Yes [provider]  nicotine (NICODERM CQ - DOSED IN MG/24 HR) 7 mg/24hr patch Place 1 patch (7 mg total) onto the  skin daily. 05/22/21  Yes Nita Sells, MD  pantoprazole (PROTONIX) 40 MG tablet Take 1 tablet (40 mg total) by mouth 2 (two) times daily. 05/22/21 07/04/22 Yes Ladell Pier, MD  polyethylene glycol (MIRALAX) 17 g packet Take 17 g by mouth daily as needed. Patient taking differently: Take 17 g by mouth daily as needed for mild constipation or moderate constipation. 07/17/21  Yes Owens Shark, NP  potassium chloride SA (KLOR-CON) 20 MEQ tablet Take 2 tablets (40 mEq total) by mouth daily. 01/20/21  Yes [name], MD  PROAIR HFA 108 [phone] Base) MCG/ACT inhaler INHALE 2 PUFFS INTO THE LUNGS EVERY 4 (FOUR) HOURS AS NEEDED FOR WHEEZING OR SHORTNESS OF BREATH. 07/04/21  Yes Mayers, Cari S, PA-C  Torsemide 40 MG TABS Take 80 mg by mouth 2 (two) times daily. 06/27/21  Yes Rafael Bihari, [place]    Physical Exam: Vitals:   07/23/21 0045 07/23/21 0100 07/23/21 0115 07/23/21 0200  BP: 100/78 104/82 109/85 113/79  Pulse: (!) 108 (!) 106 (!) 110 (!) 108  Resp: (!) '22 20 19 19  '$ Temp:      TempSrc:      SpO2: 99% 100% 100% 98%    Constitutional: Awake alert  and oriented x3, patient is in mild respiratory distress. Skin: no rashes, no lesions, good skin turgor noted. Eyes: Pupils are equally reactive to light.  No evidence of scleral icterus or conjunctival pallor.  ENMT: Moist mucous membranes noted.  Posterior pharynx clear of any exudate or lesions.   Neck: normal, supple, no masses, no thyromegaly.  Notable elevation of the jugular venous pulse at 45 degrees. Respiratory: Mild bibasilar rales with scattered rhonchi bilaterally.  No evidence of wheezing.  Increased respiratory effort without accessory muscle use.  Cardiovascular: Tachycardic rate with regular rhythm, no murmurs / rubs / gallops. No extremity edema. 2+ pedal pulses. No carotid bruits.  Chest:   Nontender without crepitus or deformity.   Back:   Nontender without crepitus or deformity. Abdomen: Abdomen is somewhat protuberant  but soft and nontender.  No evidence of intra-abdominal masses.  Positive bowel sounds noted in all quadrants.   Musculoskeletal: No joint deformity upper and lower extremities. Good ROM, no contractures. Normal muscle tone.  Neurologic: CN 2-12 grossly intact. Sensation intact.  Patient moving all 4 extremities spontaneously.  Patient is following all commands.  Patient is responsive to verbal stimuli.   Psychiatric: Patient exhibits somewhat anxious mood with appropriate affect.  Patient seems to possess insight as to their current situation.     Labs on Admission: I have personally reviewed following labs and imaging studies -   CBC: Recent Labs  Lab 07/17/21 1025 07/22/21 1449  WBC 4.6 6.9  NEUTROABS 3.4  --   HGB 11.8* 12.1*  HCT 37.2* 37.4*  MCV 81.4 82.4  PLT 156 72*   Basic Metabolic Panel: Recent Labs  Lab 07/17/21 1025 07/22/21 1449  NA 136 130*  K 4.2 5.8*  CL 98 95*  CO2 29 22  GLUCOSE 150* 158*  BUN 26* 37*  CREATININE 1.22 2.09*  CALCIUM 9.1 9.6   GFR: Estimated Creatinine Clearance: 40.7 mL/min (A) (by C-G formula based on SCr of 2.09 mg/dL (H)). Liver Function Tests: Recent Labs  Lab 07/17/21 1025  AST 17  ALT 14  ALKPHOS 88  BILITOT 2.0*  PROT 6.9  ALBUMIN 3.9   No results for input(s): LIPASE, AMYLASE in the last 168 hours. No results for input(s): AMMONIA in the last 168 hours. Coagulation Profile: No results for input(s): INR, PROTIME in the last 168 hours. Cardiac Enzymes: No results for input(s): CKTOTAL, CKMB, CKMBINDEX, TROPONINI in the last 168 hours. BNP (last 3 results) No results for input(s): PROBNP in the last 8760 hours. HbA1C: No results for input(s): HGBA1C in the last 72 hours. CBG: No results for input(s): GLUCAP in the last 168 hours. Lipid Profile: No results for input(s): CHOL, HDL, LDLCALC, TRIG, CHOLHDL, LDLDIRECT in the last 72 hours. Thyroid Function Tests: No results for input(s): TSH, T4TOTAL, FREET4, T3FREE,  THYROIDAB in the last 72 hours. Anemia Panel: No results for input(s): VITAMINB12, FOLATE, FERRITIN, TIBC, IRON, RETICCTPCT in the last 72 hours. Urine analysis:    Component Value Date/Time   COLORURINE YELLOW 06/17/2021 2225   APPEARANCEUR CLEAR 06/17/2021 2225   LABSPEC 1.011 06/17/2021 2225   PHURINE 5.0 06/17/2021 2225   GLUCOSEU NEGATIVE 06/17/2021 2225   HGBUR NEGATIVE 06/17/2021 2225   BILIRUBINUR NEGATIVE 06/17/2021 2225   KETONESUR NEGATIVE 06/17/2021 2225   PROTEINUR 100 (A) 06/17/2021 2225   NITRITE NEGATIVE 06/17/2021 2225   LEUKOCYTESUR NEGATIVE 06/17/2021 2225    Radiological Exams on Admission - Personally Reviewed: DG Chest 2 View  Result Date: 07/22/2021 CLINICAL  DATA:  55 year old male with chest pain. EXAM: CHEST - 2 VIEW COMPARISON:  Chest radiograph dated 06/21/2021. FINDINGS: Left-sided Port-A-Cath with tip close to the cavoatrial junction. There is cardiomegaly with mild vascular congestion. No focal consolidation, pleural effusion, or pneumothorax. No acute osseous pathology. IMPRESSION: Cardiomegaly with mild vascular congestion. No focal consolidation. Electronically Signed   By: Anner Crete M.D.   On: 07/22/2021 15:41    EKG: Personally reviewed.  Rhythm is currently sinus tachycardia with heart rate of 115 bpm.  Evidence of LVH.  No dynamic ST segment changes appreciated.  Assessment/Plan Principal Problem:   Acute on chronic combined systolic and diastolic CHF (congestive heart failure) ([place])  Patient with known history of advanced cardiomyopathy with severely depressed ejection fraction now presenting with a recurrent episode of progressive shortness of breath including PND and pillow orthopnea  Patient presenting with markedly elevated BNP, minimal pulmonary edema on chest x-ray, elevated JVP Presentation complicated by concurrent acute kidney injury and lactic acidosis Overall I feel the patient is suffering from the sequela of end-stage  congestive heart failure with possible concurrent cardiorenal syndrome.  I am uncertain how much aggressive intravenous diuretics will assist with this patient's symptoms. Patient historically on high doses of outpatient torsemide 80 mg oral twice daily Patient is already been evaluated by cardiology fellow Dr. Renella Cunas who has administered 120 mg of intravenous Lasix bolus followed by 20 mg an hour of intravenous Lasix infusion. Will monitor for symptomatic improvement and if patient fails to improve will consider initiation of inotropes Strict input and output monitoring Daily weights Monitoring renal function and electrolytes with serial chemistries Cardiology heart failure team to continue to follow daily, their input is appreciated.  Active Problems:   Acute renal failure superimposed on stage 3b chronic kidney disease (HCC)  Notable acute kidney injury with complicating hyperkalemia on arrival with creatinine currently 2.09, up from baseline of 1.2 Etiology of renal injury is not completely clear.  I am concerned for the possibility of developing cardiorenal syndrome Obtaining urinalysis and urine electrolytes Obtaining renal ultrasound Strict input and output monitoring Considering initiation of diuretics by cardiology, will monitor for paradoxical improvement in renal function with diuresis.  If this fails to improve patient clinically, will consider initiation of inotropes.  Lactic acidosis  Review of chart reveals that patient has had a chronically elevated lactic acid for at least the past year and a half Possible type A lactic acidosis due to hypoperfusion from advanced congestive heart failure Type B lactic acidosis it is also a rare complication of gastric malignancy Performing serial lactic acid levels to ensure downtrending and resolution    Hyperkalemia  Mild hyperkalemia likely secondary to diminished renal function Administering Lokelma, administering diuretics as  above No evidence of EKG changes Monitoring potassium levels closely with serial chemistries Monitoring patient on telemetry    Coronary artery disease involving native coronary artery of native heart without angina pectoris  No evidence of dynamic ST segment change on EKG Patient did complain of some intermittent atypical chest discomfort over the past several days but I do not believe this is cardiac. Slightly elevated troponins but when compared to prior hospitalizations these are lower than usual making plaque rupture unlikely Monitoring patient on telemetry    Gastric cancer [facility])  Diagnosis 10/2020 Status post 6 cycles of FOLFOX Patient continuing to receive radiation therapy Patient additionally receiving daily Xeloda Continue outpatient follow-up    Cocaine abuse [facility])  Patient reports abstinence for approximately 1 month  Urine toxicology screen negative Continuing to counsel patient on cessation    COPD (chronic obstructive pulmonary disease) (HCC)  No clinical evidence of COPD exacerbation As needed bronchodilator therapy for shortness of breath and wheezing    Nicotine dependence, cigarettes, uncomplicated  Counseling patient daily on cessation    Type 2 diabetes mellitus with stage 3b chronic kidney disease, without long-term current use of insulin ([place])  Accu-Cheks before every meal and nightly with sliding scale insulin   Code Status:  Full code Family Communication: deferred   Status is: Inpatient  Remains inpatient appropriate because:Ongoing diagnostic testing needed not appropriate for outpatient work up, IV treatments appropriate due to intensity of illness or inability to take PO, and Inpatient level of care appropriate due to severity of illness  Dispo: The patient is from: ALF              Anticipated d/c is to: ALF              Patient currently is not medically stable to d/c.   Difficult to place patient No        Vernelle Emerald  MD [facility] Pager [phone]  If 7PM-7AM, please contact night-coverage [email or web address] Use universal [place] password for that web site. If you do not have the password, please call the hospital operator.  07/23/2021, 3:07 AM

## 2021-07-23 NOTE — Progress Notes (Signed)
Patient ID: Tyler Wong, male   DOB: December 15, 1966, 55 y.o.   MRN: [id number]  Discussed with Dr. Waldron Labs. Patient has gastric cancer and needs daily radiation.  He has advanced (end stage) HF, not candidate for advanced therapies given inability to stop cocaine as well as gastric cancer.   I think it would be ok to admit him to [facility] so he can continue his radiation.  He will be followed by [facility] cardiology at [facility].  He is on a Lasix gtt which will continue.  Would hold off on milrinone if possible as there is nothing to bridge this to and he cannot go home on it with ongoing cocaine abuse.   Loralie Champagne 07/23/2021

## 2021-07-23 NOTE — Telephone Encounter (Signed)
Client responded he was seen in ED on yesterday and admitted ,will cancel ride for treatment today

## 2021-07-23 NOTE — Telephone Encounter (Signed)
Appointment is at [facility] and provider will be there. Will change transport from [facility] to [place]

## 2021-07-23 NOTE — Telephone Encounter (Signed)
Transportation ride for today cancelled due to hospitalization

## 2021-07-23 NOTE — Progress Notes (Signed)
PROGRESS NOTE    Tyler Wong  [id number] DOB: May 30, 1966 DOA: 07/22/2021 PCP: [name], NP    Chief Complaint  Patient presents with   Chest Pain    Brief Narrative:     This is a no charge note as patient was seen and admitted earlier today by Dr. Cyd Silence, patient was seen and examined, chart, imaging, labs were reviewed.  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   -Patient report he is having progressive dyspnea over the past week beyond his baseline, he does report orthopnea, paroxysmal nocturnal dyspnea as well, he reports worsening lower extremity edema, his work-up in ED was significant for volume overload, with elevated BNP, vascular congestion on x-ray and worsening lower extremity edema, patient report he thinks he has been given list torsemide at the facility, but CHF team confirmed patient is on appropriate dosing of 80 mg of torsemide twice daily, patient was admitted for further management, he was started on Lasix drip, patient with known history of gastric cancer, he is currently on Xeloda, and radiation therapy, chest with radiation oncology, patient will be transferred to [facility] long hospital where he can continue his patient therapy on schedule tomorrow (he missed today's session already), and [facility] MG will keep following at [facility] long for his heart failure as patient is not any candidate for advanced therapy.    Assessment & Plan:   Principal Problem:   Acute on chronic combined systolic and diastolic CHF (congestive heart failure) (HCC) Active Problems:   Coronary artery disease involving  native coronary artery of native heart without angina pectoris   Acute on chronic systolic CHF (congestive heart failure) (HCC)   Acute renal failure superimposed on stage 3b chronic kidney disease (HCC)   Gastric cancer (HCC)   Cocaine abuse (HCC)   COPD (chronic obstructive pulmonary disease) (HCC)   Hyperkalemia   Nicotine dependence, cigarettes, uncomplicated   Type 2 diabetes mellitus with stage 3b chronic kidney disease, without long-term current use of insulin (HCC)   Acute on chronic combined systolic and diastolic CHF (congestive heart failure) ([place]) - Patient with known history of advanced cardiomyopathy with severely depressed ejection fraction now presenting with a recurrent episode of progressive shortness of breath including PND and pillow orthopnea, with markedly elevated BNP, minimal pulmonary edema on chest x-ray, elevated JVP -Cardiology input greatly appreciated, continue with Lasix drip at 20 mg/h, daily weights, strict ins and out. -Not on any beta-blockers given decompensated heart failure -Jardiance while in decompensated heart failure -Management per CHF team.   Acute renal failure superimposed on stage 3b chronic kidney disease ([place]) -Improving with IV diuresis -Avoid nephrotoxic medications  Lactic acidosis - Possible type A lactic acidosis due to hypoperfusion from advanced congestive heart failure vs type B lactic acidosis it is also a rare complication of gastric malignancy -Trending down for> 3.2> 2.3   Hyperkalemia Resolved with diuresis   Gastric cancer ([place]) - Diagnosis 10/2020 - Status post 6 cycles of FOLFOX - Patient continuing to receive radiation therapy, will be transferred to [facility] to continue his radiation therapy from tomorrow -Patient on Xeloda, will hold in the setting CHF, as discussed with primary oncologist Dr. Benay Spice  Coronary artery disease involving native coronary artery of native heart without angina  pectoris   No evidence of dynamic ST segment change on EKG Patient did complain of some intermittent atypical chest discomfort over the past several days but I do not believe this is cardiac. Slightly elevated troponins but when compared to prior hospitalizations these are lower than usual making plaque rupture unlikely Monitoring patient on telemetry      Cocaine abuse [facility])   Patient reports abstinence for approximately 1 month Urine toxicology screen negative Continuing to counsel patient on cessation     COPD (chronic obstructive pulmonary disease) (HCC)   No clinical evidence of COPD exacerbation As needed bronchodilator therapy for shortness of breath and wheezing     Nicotine dependence, cigarettes, uncomplicated   Counseling patient daily on cessation     Type 2 diabetes mellitus with stage 3b chronic kidney disease, without long-term current use of insulin ([place])   Accu-Cheks before every meal and nightly with sliding scale insulin      DVT prophylaxis: Lovenox Code Status: Full Family Communication: None at bedside Disposition:   Status is: Inpatient  Remains inpatient appropriate because:IV treatments appropriate due to intensity of illness or inability to take PO  Dispo: The patient is from: SNF              Anticipated d/c is to: SNF              Patient currently is not medically stable to d/c.   Difficult to place patient No       Consultants:  CHF team/cardiology   Time spent: NO CHARGE  Subjective:  Patient denies any chest pain, cough, reports he is feeling better, his dyspnea has improved.  Objective: Vitals:   07/23/21 0957 07/23/21 1530 07/23/21 1534 07/23/21 1535  BP: 95/73 90/74 119/83   Pulse: (!) 107 (!) 41 (!) 111   Resp: '12 16 16   '$ Temp:   98 F (36.7 C)   TempSrc:   Oral   SpO2: 97% 100% 100%   Weight:    72 kg  Height:    '5\' 10"'$  (1.778 m)    Intake/Output Summary (Last 24 hours) at 07/23/2021 1629 Last data filed at  07/23/2021 1537 Gross per 24 hour  Intake --  Output 2000 ml  Net -2000 ml   Filed Weights   07/23/21 1535  Weight: 72 kg    Examination:  Awake Alert, Oriented X 3, No new F.N deficits, Normal affect Symmetrical Chest wall movement, Good air movement bilaterally, CTAB RRR,No Gallops,Rubs or new Murmurs, No Parasternal Heave +ve B.Sounds, Abd Soft, No tenderness, No rebound - guarding or rigidity. No Cyanosis, Clubbing , +1 edema, No new Rash or bruise       Data Reviewed: I have personally reviewed following labs and imaging studies  CBC: Recent Labs  Lab 07/17/21 1025 07/22/21 1449 07/23/21 0325  WBC 4.6 6.9 6.0  NEUTROABS 3.4  --  4.8  HGB 11.8* 12.1* 11.1*  HCT 37.2* 37.4* 33.7*  MCV 81.4 82.4 81.8  PLT 156 72* 68*    Basic Metabolic Panel: Recent Labs  Lab 07/17/21 1025 07/22/21 1449 07/23/21 0325  NA 136 130* 132*  K 4.2 5.8* 4.5  CL 98 95* 98  CO2 '29 22 22  '$ GLUCOSE 150* 158* 170*  BUN 26* 37* 36*  CREATININE 1.22 2.09* 1.88*  CALCIUM 9.1 9.6 9.2  MG  --   --  2.5*    GFR:  Estimated Creatinine Clearance: 45.2 mL/min (A) (by C-G formula based on SCr of 1.88 mg/dL (H)).  Liver Function Tests: Recent Labs  Lab 07/17/21 1025 07/23/21 0325  AST 17 23  ALT 14 18  ALKPHOS 88 89  BILITOT 2.0* 4.1*  PROT 6.9 7.0  ALBUMIN 3.9 3.6    CBG: Recent Labs  Lab 07/23/21 0826 07/23/21 1221  GLUCAP 130* 147*     Recent Results (from the past 240 hour(s))  Resp Panel by RT-PCR (Flu A&B, Covid) Nasopharyngeal Swab     Status: None   Collection Time: 07/23/21 12:40 AM   Specimen: Nasopharyngeal Swab; Nasopharyngeal(NP) swabs in vial transport medium  Result Value Ref Range Status   SARS Coronavirus 2 by RT PCR NEGATIVE NEGATIVE Final    Comment: (NOTE) SARS-CoV-2 target nucleic acids are NOT DETECTED.  The SARS-CoV-2 RNA is generally detectable in upper respiratory specimens during the acute phase of infection. The lowest concentration of  SARS-CoV-2 viral copies this assay can detect is 138 copies/mL. A negative result does not preclude SARS-Cov-2 infection and should not be used as the sole basis for treatment or other patient management decisions. A negative result may occur with  improper specimen collection/handling, submission of specimen other than nasopharyngeal swab, presence of viral mutation(s) within the areas targeted by this assay, and inadequate number of viral copies(<138 copies/mL). A negative result must be combined with clinical observations, patient history, and epidemiological information. The expected result is Negative.  Fact Sheet for Patients:  EntrepreneurPulse.com.au  Fact Sheet for Healthcare Providers:  IncredibleEmployment.be  This test is no t yet approved or cleared by the [place] FDA and  has been authorized for detection and/or diagnosis of SARS-CoV-2 by FDA under an Emergency Use Authorization (EUA). This EUA will remain  in effect (meaning this test can be used) for the duration of the COVID-19 declaration under Section 564(b)(1) of the Act, 21 U.S.C.section 360bbb-3(b)(1), unless the authorization is terminated  or revoked sooner.       Influenza A by PCR NEGATIVE NEGATIVE Final   Influenza B by PCR NEGATIVE NEGATIVE Final    Comment: (NOTE) The Xpert Xpress SARS-CoV-2/FLU/RSV plus assay is intended as an aid in the diagnosis of influenza from Nasopharyngeal swab specimens and should not be used as a sole basis for treatment. Nasal washings and aspirates are unacceptable for Xpert Xpress SARS-CoV-2/FLU/RSV testing.  Fact Sheet for Patients: EntrepreneurPulse.com.au  Fact Sheet for Healthcare Providers: IncredibleEmployment.be  This test is not yet approved or cleared by the [place] FDA and has been authorized for detection and/or diagnosis of SARS-CoV-2 by FDA under an Emergency Use  Authorization (EUA). This EUA will remain in effect (meaning this test can be used) for the duration of the COVID-19 declaration under Section 564(b)(1) of the Act, 21 U.S.C. section 360bbb-3(b)(1), unless the authorization is terminated or revoked.  Performed at [facility], [place] [address] [place], [place] [address]          Radiology Studies: DG Chest 2 View  Result Date: 07/22/2021 CLINICAL DATA:  55 year old male with chest pain. EXAM: CHEST - 2 VIEW COMPARISON:  Chest radiograph dated 06/21/2021. FINDINGS: Left-sided Port-A-Cath with tip close to the cavoatrial junction. There is cardiomegaly with mild vascular congestion. No focal consolidation, pleural effusion, or pneumothorax. No acute osseous pathology. IMPRESSION: Cardiomegaly with mild vascular congestion. No focal consolidation. Electronically Signed   By: Anner Crete M.D.   On: 07/22/2021 15:41   US RENAL  Result Date:  07/23/2021 CLINICAL DATA:  Renal failure. EXAM: RENAL / URINARY TRACT ULTRASOUND COMPLETE COMPARISON:  Ultrasound 06/19/2021. FINDINGS: Right Kidney: Renal measurements: 9.9 x 4.4 x 4.1 cm = volume: 91.9 mL. Mild increased echogenicity cannot be excluded. No mass or hydronephrosis visualized. Left Kidney: Renal measurements: 10.7 x 5.3 x 4.6 cm = volume: 137.1 mL. Mild increased echogenicity cannot be excluded. No mass or hydronephrosis visualized. Bladder: Bladder is nondistended. Other: None. IMPRESSION: Mild increased echogenicity both kidneys cannot be excluded. Chronic medical renal disease cannot be excluded. No acute abnormality. No hydronephrosis or bladder distention. Electronically Signed   By: Marcello Moores  Register   On: 07/23/2021 06:01        Scheduled Meds:  aspirin  81 mg Oral q morning   atorvastatin  80 mg Oral Daily   enoxaparin (LOVENOX) injection  40 mg Subcutaneous Q24H   gabapentin  100 mg Oral BID   insulin aspart  0-15 Units Subcutaneous TID AC & HS   metoCLOPramide  5 mg  Oral TID AC   pantoprazole  40 mg Oral BID   Continuous Infusions:  furosemide (LASIX) 200 mg in dextrose 5% 100 mL ('2mg'$ /mL) infusion 20 mg/hr (07/23/21 1536)     LOS: 0 days       Phillips Climes, MD [facility]   To contact the attending provider between 7A-7P or the covering provider during after hours 7P-7A, please log into the web site [email or web address] and access using universal [place] password for that web site. If you do not have the password, please call the hospital operator.  07/23/2021, 4:29 PM

## 2021-07-23 NOTE — ED Notes (Signed)
Pt ambulatory to restroom with one person stand by assist for safety

## 2021-07-23 NOTE — Consult Note (Signed)
Cardiology Consultation:   Patient ID: Tyler Wong MRN: [id number]; DOB: 25-Jan-1966  Admit date: 07/22/2021 Date of Consult: 07/23/2021  Primary Care Provider: [name], NP [facility] Cardiologist: Shelva Majestic, MD  [place] Electrophysiologist:  None   Patient Profile:   Tyler Wong is a 55 y.o. 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   History of Present Illness:   Tyler Wong was admitted to [facility] on 07/07/21 and felt that over the past few weeks he has had worsening HF symptoms with more recent decrease in his UOP and worsening shortness of breath.  Several days ago he said there is only one tablet of his torsemide (20 mg tablet) in his evening medications and he thinks that he was being given this reduced dose for several days (prescribed dose torsemide 80 mg PO bid).  Around 9 days ago he noticed a drop in his urine output and worsening bladder pressure where he felt that he could not void as well.  He denies significant orthopnea or PND and only had trivial LE swelling.  He reports his baseline weight as 154 lb.  He went to his appointment at the cancer center yesterday and requested transfer to the ED as he was having worsening SOB and felt it was secondary to incorrect torsemide dosing.  He also noticed loss of appetite and nausea but this is in the setting of chemo/radiation treatment.  He was admitted last month (05/2021) for abdominal pain and was found to be tachycardic, tachypneic, with uptrending lactic acidosis. Evaluation included a CT scan that was concerning for acute acalculous cholecystitis however follow-up HIDA was negative along with MRCP that only  showed GB wall thickening.  He developed worsening HF following fluid resuscitation and required aggressive diuresis before discharge.  In 04/2021 he was admitted for decompensated HF and required Lasix IV with temporary milrinone.   Past Medical History:  Diagnosis Date   CAD in native artery 07/17/2014   STEMI- LAD stenosis with DES   CHF (congestive heart failure) (HCC)    Cocaine abuse ([place])    COPD (chronic obstructive pulmonary disease) ([place])    Diabetes mellitus without complication ([place])    Dyspnea    Gastric cancer ([place]) 11/01/2020   GERD (gastroesophageal reflux disease)    Hypertension    ST elevation myocardial infarction (STEMI) involving left anterior descending (LAD) coronary artery with complication ([place])    Tobacco abuse 07/17/2014   Past Surgical History:  Procedure Laterality Date   BIOPSY  11/05/2020   Procedure: BIOPSY;  Surgeon: Wilford Corner, MD;  Location: [place];  Service: Endoscopy;;   BIOPSY  06/20/2021   Procedure: BIOPSY;  Surgeon: Ronald Lobo, MD;  Location: [place];  Service: Endoscopy;;   CORONARY ANGIOPLASTY WITH STENT PLACEMENT  07/18/14   resolute DES to LAD STEMI   ESOPHAGOGASTRODUODENOSCOPY N/A 11/05/2020   Procedure: ESOPHAGOGASTRODUODENOSCOPY (EGD);  Surgeon: Wilford Corner, MD;  Location: [place];  Service: Endoscopy;  Laterality: N/A;   ESOPHAGOGASTRODUODENOSCOPY N/A 06/20/2021   Procedure: ESOPHAGOGASTRODUODENOSCOPY (EGD);  Surgeon: Ronald Lobo, MD;  Location: [facility] ENDOSCOPY;  Service: Endoscopy;  Laterality: N/A;   IR IMAGING GUIDED PORT INSERTION  12/06/2020   LEFT HEART CATH N/A 07/17/2014   Procedure: LEFT HEART CATH;  Surgeon: [name], MD;  Location:  [place] CATH LAB;  Service: Cardiovascular;  Laterality: N/A;   PERCUTANEOUS CORONARY STENT INTERVENTION (PCI-S)  07/17/2014   Procedure: PERCUTANEOUS CORONARY STENT INTERVENTION (PCI-S);  Surgeon: [name], MD;  Location: [facility] CATH LAB;  Service:  Cardiovascular;;   RIGHT/LEFT HEART CATH AND CORONARY ANGIOGRAPHY N/A 10/03/2018   Procedure: RIGHT/LEFT HEART CATH AND CORONARY ANGIOGRAPHY;  Surgeon: [name], MD;  Location: [place] CV LAB;  Service: Cardiovascular;  Laterality: N/A;    Home Medications:  Prior to Admission medications   Medication Sig Start Date End Date Taking? Authorizing Provider  ASPIRIN LOW DOSE 81 MG EC tablet TAKE 1 TABLET (81 MG TOTAL) BY MOUTH DAILY (MORNING) Patient taking differently: Take 81 mg by mouth every morning. 03/05/21  Yes Bensimhon, Shaune Pascal, MD  atorvastatin (LIPITOR) 80 MG tablet Take 1 tablet (80 mg total) by mouth daily. 11/26/20  Yes [name], MD  capecitabine (XELODA) 500 MG tablet Take 3 tablets (1,500 mg total) by mouth 2 (two) times daily after a meal. Take Monday-Friday. Take only on days of radiation. 06/24/21  Yes Ladell Pier, MD  empagliflozin (JARDIANCE) 10 MG TABS tablet Take 1 tablet (10 mg total) by mouth daily. 11/26/20  Yes [name], MD  gabapentin (NEURONTIN) 100 MG capsule Take 1 capsule (100 mg total) by mouth 2 (two) times daily. 05/21/21  Yes Nita Sells, MD  metoCLOPramide (REGLAN) 5 MG tablet Take 1 tablet (5 mg total) by mouth every 8 (eight) hours as needed for nausea or vomiting. 06/23/21 06/23/22 Yes Mikhail, Velta Addison, DO  Multiple Vitamins-Minerals (MULTIVITAMIN WITH MINERALS) tablet Take 1 tablet by mouth daily.   Yes [provider]  nicotine (NICODERM CQ - DOSED IN MG/24 HR) 7 mg/24hr patch Place 1 patch (7 mg total) onto the skin daily. 05/22/21  Yes Nita Sells, MD  pantoprazole (PROTONIX) 40 MG tablet Take 1 tablet (40 mg total) by mouth 2 (two) times daily. 05/22/21 07/04/22 Yes Ladell Pier, MD  polyethylene glycol (MIRALAX) 17 g packet Take 17 g by mouth daily as needed. Patient taking differently: Take 17 g by mouth daily as needed for mild constipation or moderate constipation. 07/17/21  Yes Owens Shark, NP   potassium chloride SA (KLOR-CON) 20 MEQ tablet Take 2 tablets (40 mEq total) by mouth daily. 01/20/21  Yes [name], MD  PROAIR HFA 108 [phone] Base) MCG/ACT inhaler INHALE 2 PUFFS INTO THE LUNGS EVERY 4 (FOUR) HOURS AS NEEDED FOR WHEEZING OR SHORTNESS OF BREATH. 07/04/21  Yes Mayers, Cari S, PA-C  Torsemide 40 MG TABS Take 80 mg by mouth 2 (two) times daily. 06/27/21  Yes Milford, Maricela Bo, FNP    Inpatient Medications: Scheduled Meds:  Continuous Infusions:  furosemide (LASIX) 200 mg in dextrose 5% 100 mL ('2mg'$ /mL) infusion     PRN Meds:   Allergies:   No Known Allergies  Social History:   Social History   Socioeconomic History   Marital status: Single    Spouse name: Not on file   Number of children: 2   Years of education: 12   Highest education level: 12th grade  Occupational History   Occupation: Landscaping  Tobacco Use   Smoking status: Every Day    Packs/day: 0.25    Years: 30.00    Pack years: 7.50    Types: Cigarettes   Smokeless tobacco: Never   Tobacco comments:    .5 PK PER DAY  Vaping Use   Vaping Use: Never used  Substance and Sexual Activity   Alcohol use: No    Comment: Patient denies abuse, states social drinker   Drug use: Yes    Types: Cocaine, "Crack" cocaine    Comment: heroin   Sexual activity: Yes  Other Topics Concern   Not on file  Social History Narrative   Lives in [place] with his sister and girlfriend. He works Aeronautical engineer work.    Social Determinants of Health   Financial Resource Strain: High Risk   Difficulty of Paying Living Expenses: Very hard  Food Insecurity: Food Insecurity Present   Worried About Charity fundraiser in the Last Year: Sometimes true   Ran Out of Food in the Last Year: Sometimes true  Transportation Needs: No Transportation Needs   Lack of Transportation (Medical): No   Lack of Transportation (Non-Medical): No  Physical Activity: Not on file  Stress: Not on file  Social Connections:  Not on file  Intimate Partner Violence: Not At Risk   Fear of Current or Ex-Partner: No   Emotionally Abused: No   Physically Abused: No   Sexually Abused: No    Family History:   Family History  Problem Relation Age of Onset   Cirrhosis Father    Heart attack Mother    Heart attack Sister    Heart failure Sister    Heart attack Brother     ROS:  Review of Systems: [y] = yes, '[ ]'$  = no      General: Weight gain [y]; Weight loss '[ ]'$ ; Anorexia [y]; Fatigue [y]; Fever '[ ]'$ ; Chills '[ ]'$ ; Weakness [y]   Cardiac: Chest pain/pressure '[ ]'$ ; Resting SOB [y]; Exertional SOB [y]; Orthopnea '[ ]'$ ; Pedal Edema '[ ]'$ ; Palpitations '[ ]'$ ; Syncope '[ ]'$ ; Presyncope '[ ]'$ ; Paroxysmal nocturnal dyspnea '[ ]'$    Pulmonary: Cough '[ ]'$ ; Wheezing '[ ]'$ ; Hemoptysis '[ ]'$ ; Sputum '[ ]'$ ; Snoring '[ ]'$    GI: Vomiting '[ ]'$ ; Dysphagia '[ ]'$ ; Melena '[ ]'$ ; Hematochezia '[ ]'$ ; Heartburn '[ ]'$ ; Abdominal pain '[ ]'$ ; Constipation [y]; Diarrhea '[ ]'$ ; BRBPR '[ ]'$    GU: Hematuria '[ ]'$ ; Dysuria '[ ]'$ ; Nocturia '[ ]'$  Vascular: Pain in legs with walking '[ ]'$ ; Pain in feet with lying flat '[ ]'$ ; Non-healing sores '[ ]'$ ; Stroke '[ ]'$ ; TIA '[ ]'$ ; Slurred speech '[ ]'$ ;   Neuro: Headaches '[ ]'$ ; Vertigo '[ ]'$ ; Seizures '[ ]'$ ; Paresthesias '[ ]'$ ;Blurred vision '[ ]'$ ; Diplopia '[ ]'$ ; Vision changes '[ ]'$    Ortho/Skin: Arthritis '[ ]'$ ; Joint pain '[ ]'$ ; Muscle pain '[ ]'$ ; Joint swelling '[ ]'$ ; Back Pain '[ ]'$ ; Rash '[ ]'$    Psych: Depression '[ ]'$ ; Anxiety '[ ]'$    Heme: Bleeding problems '[ ]'$ ; Clotting disorders '[ ]'$ ; Anemia '[ ]'$    Endocrine: Diabetes '[ ]'$ ; Thyroid dysfunction '[ ]'$    Physical Exam/Data:   Vitals:   07/22/21 2330 07/22/21 2345 07/23/21 0000 07/23/21 0015  BP:  105/79 97/81 103/79  Pulse:  (!) 115 (!) 101 (!) 110  Resp: 20 (!) '26 13 20  '$ Temp:      TempSrc:      SpO2:  98% 98% 100%   No intake or output data in the 24 hours ending 07/23/21 0016 Last 3 Weights 07/17/2021 07/04/2021 07/04/2021  Weight (lbs) 159 lb 159 lb 9 oz 159 lb 3.2 oz  Weight (kg) 72.122 kg 72.377 kg 72.213 kg  Some encounter  information is confidential and restricted. Go to Review Flowsheets activity to see all  data.     There is no height or weight on file to calculate BMI.  General: mild cachexia, otherwise stable appearing HEENT: normal Lymph: no adenopathy Neck: no JVD Endocrine:  No thryomegaly Vascular: No carotid bruits; FA pulses 2+ bilaterally without bruits  Cardiac:  normal S1, S2; RRR; no murmur  Lungs:  clear to auscultation bilaterally, no wheezing, rhonchi or rales  Abd: soft, nontender, no hepatomegaly  Ext: trivial LE edema  Musculoskeletal:  No deformities, BUE and BLE strength normal and equal Skin: warm and dry  Neuro:  CNs 2-12 intact, no focal abnormalities noted Psych:  Normal affect   EKG:  The EKG was personally reviewed and demonstrates: 07/22/21 (14:46) sinus tachycardia (HR 115), septal/inferior Q waves, LVH, no acute ischemic changes  Telemetry:  Telemetry was personally reviewed and demonstrates: sinus tachycardia   Relevant CV Studies:  TTE Result date: 10/04/20  1. 3 D images of the LV were obtained. . Left ventricular ejection  fraction, by estimation, is 20 to 25%. The left ventricle has severely  decreased function. The left ventricle demonstrates global hypokinesis.  The left ventricular internal cavity size  was moderately dilated. There is mild left ventricular hypertrophy. Left  ventricular diastolic parameters are consistent with Grade II diastolic  dysfunction (pseudonormalization).   2. Right ventricular systolic function is low normal. The right  ventricular size is normal. There is moderately elevated pulmonary artery  systolic pressure. The estimated right ventricular systolic pressure is  [id number] mmHg.   3. Left atrial size was moderately dilated.   4. Right atrial size was severely dilated.   5. The mitral valve is grossly normal. Moderate to severe mitral valve  regurgitation. Mild mitral stenosis.   6. Tricuspid valve regurgitation is moderate to  severe.   7. The aortic valve is normal in structure. Aortic valve regurgitation is  not visualized. No aortic stenosis is present.   Laboratory Data:  High Sensitivity Troponin:   Recent Labs  Lab 07/22/21 1449 07/22/21 1649  TROPONINIHS 175* 208*     Chemistry Recent Labs  Lab 07/17/21 1025 07/22/21 1449  NA 136 130*  K 4.2 5.8*  CL 98 95*  CO2 29 22  GLUCOSE 150* 158*  BUN 26* 37*  CREATININE 1.22 2.09*  CALCIUM 9.1 9.6  GFRNONAA >60 37*  ANIONGAP 9 13    Recent Labs  Lab 07/17/21 1025  PROT 6.9  ALBUMIN 3.9  AST 17  ALT 14  ALKPHOS 88  BILITOT 2.0*   Hematology Recent Labs  Lab 07/17/21 1025 07/22/21 1449  WBC 4.6 6.9  RBC 4.57 4.54  HGB 11.8* 12.1*  HCT 37.2* 37.4*  MCV 81.4 82.4  MCH 25.8* 26.7  MCHC 31.7 32.4  RDW 26.8* 28.1*  PLT 156 72*   BNP Recent Labs  Lab 07/22/21 1459  BNP 2,978.4*    DDimer No results for input(s): DDIMER in the last 168 hours.  Radiology/Studies:  DG Chest 2 View  Result Date: 07/22/2021 CLINICAL DATA:  55 year old male with chest pain. EXAM: CHEST - 2 VIEW COMPARISON:  Chest radiograph dated 06/21/2021. FINDINGS: Left-sided Port-A-Cath with tip close to the cavoatrial junction. There is cardiomegaly with mild vascular congestion. No focal consolidation, pleural effusion, or pneumothorax. No acute osseous pathology. IMPRESSION: Cardiomegaly with mild vascular congestion. No focal consolidation. Electronically Signed   By: Anner Crete M.D.   On: 07/22/2021 15:41    Assessment and Plan:   Cardiogenic shock Acute decompensated HF HFrEF (EF 20-25%) NICM/ICM  Polysubstance use Tyler Wong presented with recurrent decompensated heart failure and evolving cardiogenic shock with AKI (sCr 2.1, bl 1.2-1.9) and lactic acidosis (4.0).  His weight this past week was 72.1 kg which is similar to his prior wt trends (72.2 kg on 07/04/21, 71.1 on 07/07, 75.3 on 07/01).  He does not have significant lower extremity edema on  exam and his JVP is moderately elevated to mid neck at 30 degrees however he is able to lay flat and denies significant orthopnea or PND over the past few weeks.  I did call his facility because his concern was whether he was being underdosed on his torsemide however his facility did confirm that he has been receiving torsemide 80 mg p.o. twice daily since his admission so I do not think that this is a primary driver for his decompensation.  Unfortunately Tyler Wong has end-stage heart failure with recurrent admissions requiring intermittent inotropes.  He has been evaluated by palliative care during prior hospitalizations but understandably does not like having discussions regarding the end of his life given his young age.  He was fairly pleasant today when discussing the bigger picture issues and acknowledged the analogy that he was on the last cylinder of his engine.  I explained that we are extremely limited in treating his recurrent HF decompensations and that although we can try to augment diuresis further I worry that he will just continue to have more frequent and severe exacerbations.  Given that he has poor p.o. intake over the past few weeks some of his weight may just be replaced with fluid however his physical exam is not very impressive.  At this time I think he just has low output heart failure.  Fortunately for now we were able to get by with aggressive diuresis and he had moderate improvement in his UOP along with improvement in his lactic acidosis and renal function. - continue lasix 20 mg/h - consult palliative care - for now will hold off on offering milrinone, this would be a bridge to no where given his active substance use and inability to continue as an OP at his facility  - hold jardiance while in decompensated HF  - hold home torsemide while on lasix gtt - unable to tolerate BB given decompensated HF  - trend lactate q6h, BMP/Mg q12h  For questions or updates, please contact [place] Please consult [email or web address] for contact info under   Signed, Dion Body, MD  07/23/2021 12:16 AM

## 2021-07-23 NOTE — ED Notes (Signed)
Pt received lunch tray 

## 2021-07-24 ENCOUNTER — Ambulatory Visit
Admission: RE | Admit: 2021-07-24 | Discharge: 2021-07-24 | Disposition: A | Discharge status: Home / Self Care | Payer: Medicaid Other | Source: Ambulatory Visit | Attending: Radiation Oncology | Admitting: Radiation Oncology

## 2021-07-24 ENCOUNTER — Inpatient Hospital Stay (INDEPENDENT_AMBULATORY_CARE_PROVIDER_SITE_OTHER): Payer: Medicaid Other | Admitting: Primary Care

## 2021-07-24 DIAGNOSIS — F1410 Cocaine abuse, uncomplicated: Secondary | ICD-10-CM

## 2021-07-24 DIAGNOSIS — N171 Acute kidney failure with acute cortical necrosis: Secondary | ICD-10-CM

## 2021-07-24 DIAGNOSIS — I5043 Acute on chronic combined systolic (congestive) and diastolic (congestive) heart failure: Secondary | ICD-10-CM | POA: Diagnosis not present

## 2021-07-24 DIAGNOSIS — I5023 Acute on chronic systolic (congestive) heart failure: Secondary | ICD-10-CM | POA: Diagnosis not present

## 2021-07-24 DIAGNOSIS — Z515 Encounter for palliative care: Secondary | ICD-10-CM | POA: Diagnosis not present

## 2021-07-24 DIAGNOSIS — N1832 Chronic kidney disease, stage 3b: Secondary | ICD-10-CM

## 2021-07-24 DIAGNOSIS — J410 Simple chronic bronchitis: Secondary | ICD-10-CM

## 2021-07-24 DIAGNOSIS — Z7189 Other specified counseling: Secondary | ICD-10-CM

## 2021-07-24 LAB — CBC
HCT: 32.9 % — ABNORMAL LOW (ref 39.0–52.0)
Hemoglobin: 11 g/dL — ABNORMAL LOW (ref 13.0–17.0)
MCH: 27.1 pg (ref 26.0–34.0)
MCHC: 33.4 g/dL (ref 30.0–36.0)
MCV: 81 fL (ref 80.0–100.0)
Platelets: 66 10*3/uL — ABNORMAL LOW (ref 150–400)
RBC: 4.06 MIL/uL — ABNORMAL LOW (ref 4.22–5.81)
RDW: 29.1 % — ABNORMAL HIGH (ref 11.5–15.5)
WBC: 5.8 10*3/uL (ref 4.0–10.5)
nRBC: 0.9 % — ABNORMAL HIGH (ref 0.0–0.2)

## 2021-07-24 LAB — BASIC METABOLIC PANEL
Anion gap: 10 (ref 5–15)
BUN: 35 mg/dL — ABNORMAL HIGH (ref 6–20)
CO2: 26 mmol/L (ref 22–32)
Calcium: 8.8 mg/dL — ABNORMAL LOW (ref 8.9–10.3)
Chloride: 99 mmol/L (ref 98–111)
Creatinine, Ser: 1.42 mg/dL — ABNORMAL HIGH (ref 0.61–1.24)
GFR, Estimated: 58 mL/min — ABNORMAL LOW (ref >60)
Glucose, Bld: 180 mg/dL — ABNORMAL HIGH (ref 70–99)
Potassium: 3.4 mmol/L — ABNORMAL LOW (ref 3.5–5.1)
Sodium: 135 mmol/L (ref 135–145)

## 2021-07-24 LAB — GLUCOSE, CAPILLARY
Glucose-Capillary: 116 mg/dL — ABNORMAL HIGH (ref 70–99)
Glucose-Capillary: 144 mg/dL — ABNORMAL HIGH (ref 70–99)
Glucose-Capillary: 149 mg/dL — ABNORMAL HIGH (ref 70–99)
Glucose-Capillary: 100 mg/dL — ABNORMAL HIGH (ref 70–99)

## 2021-07-24 LAB — MAGNESIUM: Magnesium: 2.6 mg/dL — ABNORMAL HIGH (ref 1.7–2.4)

## 2021-07-24 LAB — UREA NITROGEN, URINE: Urea Nitrogen, Ur: 252 mg/dL

## 2021-07-24 MED ORDER — POTASSIUM CHLORIDE CRYS ER 20 MEQ PO TBCR
40.0000 meq | EXTENDED_RELEASE_TABLET | Freq: Once | ORAL | Status: AC
  Administered 2021-07-24: 40 meq via ORAL
  Filled 2021-07-24: qty 2

## 2021-07-24 MED ORDER — SENNOSIDES-DOCUSATE SODIUM 8.6-50 MG PO TABS
1.0000 | ORAL_TABLET | Freq: Two times a day (BID) | ORAL | Status: DC
  Administered 2021-07-24 – 2021-07-25 (×2): 1 via ORAL
  Filled 2021-07-24 (×2): qty 1

## 2021-07-24 NOTE — Progress Notes (Addendum)
IP PROGRESS NOTE  Subjective:   Mr Tyler Wong was admitted on 07/22/2021 with decompensated heart failure.  He has continued outpatient treatment with radiation and capecitabine.  No mouth sores, nausea, or diarrhea.  He is concerned he was underdosed with his diuretic at the assisted living facility, but cardiology confirmed he had been receiving the correct dose.  He reports a burning sensation on 1 occasion at the left anterior chest.  This lasted for approximately an hour.  Objective: Vital signs in last 24 hours: Blood pressure 105/78, pulse 100, temperature 97.6 F (36.4 C), resp. rate 20, height 5' 10" (1.778 m), weight 156 lb (70.8 kg), SpO2 100 %.  Intake/Output from previous day: 07/27 0701 - 07/28 0700 In: 228.2 [I.V.:228.2] Out: 2350 [Urine:2350]  Physical Exam:  HEENT: No thrush or ulcers Lungs: Decreased breath sounds at the lower chest, no respiratory distress Cardiac: Regular rate and rhythm Abdomen: No hepatosplenomegaly, nontender Extremities: No leg edema Skin: Palms and soles without erythema or skin breakdown  Portacath/PICC-without erythema  Lab Results: Recent Labs    07/23/21 0325 07/24/21 0425  WBC 6.0 5.8  HGB 11.1* 11.0*  HCT 33.7* 32.9*  PLT 68* 66*    BMET Recent Labs    07/23/21 0325 07/24/21 0425  NA 132* 135  K 4.5 3.4*  CL 98 99  CO2 22 26  GLUCOSE 170* 180*  BUN 36* 35*  CREATININE 1.88* 1.42*  CALCIUM 9.2 8.8*    Lab Results  Component Value Date   CEA1 5.94 (H) 03/19/2021   CEA 10.72 (H) 06/16/2021    Studies/Results: DG Chest 2 View  Result Date: 07/22/2021 CLINICAL DATA:  55 year old male with chest pain. EXAM: CHEST - 2 VIEW COMPARISON:  Chest radiograph dated 06/21/2021. FINDINGS: Left-sided Port-A-Cath with tip close to the cavoatrial junction. There is cardiomegaly with mild vascular congestion. No focal consolidation, pleural effusion, or pneumothorax. No acute osseous pathology. IMPRESSION: Cardiomegaly with mild  vascular congestion. No focal consolidation. Electronically Signed   By: Anner Crete M.D.   On: 07/22/2021 15:41   US RENAL  Result Date: 07/23/2021 CLINICAL DATA:  Renal failure. EXAM: RENAL / URINARY TRACT ULTRASOUND COMPLETE COMPARISON:  Ultrasound 06/19/2021. FINDINGS: Right Kidney: Renal measurements: 9.9 x 4.4 x 4.1 cm = volume: 91.9 mL. Mild increased echogenicity cannot be excluded. No mass or hydronephrosis visualized. Left Kidney: Renal measurements: 10.7 x 5.3 x 4.6 cm = volume: 137.1 mL. Mild increased echogenicity cannot be excluded. No mass or hydronephrosis visualized. Bladder: Bladder is nondistended. Other: None. IMPRESSION: Mild increased echogenicity both kidneys cannot be excluded. Chronic medical renal disease cannot be excluded. No acute abnormality. No hydronephrosis or bladder distention. Electronically Signed   By: Marcello Moores  Register   On: 07/23/2021 06:01    Medications: I have reviewed the patient's current medications.  Assessment/Plan: Gastric cancer CT abdomen/pelvis 11/01/2020-masslike area measuring 6.3 x 5.6 x 5.6 cm in the antral prepyloric region of the stomach Upper endoscopy 11/05/2020-patchy white plaques in the distal esophagus; 3 nonbleeding cratered gastric ulcers with pigmented material in the gastric antrum largest 10 mm; segmental moderate inflammation characterized by congestion, erosions and erythema in the gastric antrum.  Biopsy of stomach ulcer-intramucosal adenocarcinoma arising in a background of intestinal metaplasia, HER-2 negative PET 11/2020-focal area of hypermetabolic activity at the gastric antrum, averages into the liver and small liver lesion adjacent to stomach not excluded, small chest lymph nodes with mild increase in metabolic activity-nonspecific, symmetric parotid and extraocular muscle activity-likely physiologic Cycle 1 FOLFOX 12/12/2020  Cycle 2 FOLFOX 12/26/2020 Cycle 3 FOLFOX 01/08/2021 Cycle 4 FOLFOX 01/22/2021 Cycle 5 FOLFOX  02/05/2021-Udenyca Cycle 6 FOLFOX 02/19/2021-Udenyca CTs 02/20/2021-previously noted mass of the posterior gastric antrum no longer seen.  No lymphadenopathy or metastatic disease in the abdomen or pelvis. CT abdomen/pelvis 06/17/2021-haziness and question of wall thickening at the distal duodenum with free fluid in the upper abdomen MRI abdomen/MRCP 06/20/2021-diffuse gallbladder wall thickening, no evidence of mesenteric or retroperitoneal adenopathy, primary gastric mass not appreciated Upper endoscopy 06/20/2021-large amount of food residue in the gastric body, pylorus widely patent, no obvious tumor or ulceration, duodenum normal Radiation/Xeloda 06/25/2021 CHF, LVEF 20-25% 10/04/2020 CAD status post prior LAD stent COPD Chronic kidney disease Cocaine usage Port-A-Cath placement 12/06/2020, Interventional Radiology Mild neutropenia secondary to chemotherapy-Udenyca added with cycle 5 FOLFOX Hospital admission 05/13/2021- acute exacerbation of CHF/end-stage heart failure Hospital admission 06/17/2021-abdominal pain Admission 07/22/2021 with decompensated heart failure Thrombocytopenia on hospital admission 07/22/2021-likely secondary to capecitabine and radiation     Mr Tyler Wong is now admitted with decompensated heart failure.  I suspect his clinical presentation is related to progression/persistence of heart failure.  Capecitabine can be associated with cardiac toxicity, most commonly ischemic heart disease, but I suspect his current admission is unrelated to capecitabine.  It appears CHF is most likely his life limiting diagnosis.  I will hold capecitabine with the remainder of his radiation with a small chance capecitabine could be contributing to the heart failure.  Recommendations: Continue gastric radiation as scheduled Management of CHF per cardiology Discontinue capecitabine Outpatient follow-up at the Cancer center as scheduled     LOS: 1 day   Betsy Coder, MD   07/24/2021, 1:22  PM

## 2021-07-24 NOTE — Progress Notes (Signed)
[facility]                                                                              Patient Demographics  Tyler Wong, is a 55 y.o. male, DOB - [birth year], [id number]  Admit date - 07/22/2021   Admitting Physician Tyler Patricia, MD  Outpatient Primary MD for the patient is Tyler Perna, NP  Outpatient specialists:   LOS - 1  days   Medical records reviewed and are as summarized below:    Chief Complaint  Patient presents with   Chest Pain       Brief summary   Patient is a 55 year old male with a gastric CA, diagnosed in 10/2020, CKD stage IIIb, advanced cardiomyopathy with systolic CHF, echo [id number] EF 20 to 25%, CAD, substance abuse, COPD, GERD presented to [facility] with complaints of shortness of breath.  Patient had reported progressive dyspnea over the past week beyond his baseline, orthopnea, PND and worsening lower extremity edema. In ED, patient was found to have significant volume overload, elevated BNP and vascular congestion on the x-ray.   Patient was admitted for further work-up, CHF team/cardiology was consulted and placed on Lasix drip For gastric CA, currently on Xeloda and radiation therapy, patient was transferred to [facility] to continue XRT.   Assessment & Plan    Principal Problem:   Acute on chronic combined systolic and diastolic CHF (HCC) -Known history of advanced cardiomyopathy, severely depressed EF, presented with markedly elevated BNP, worsening dyspnea, orthopnea, PND -Cardiology following, continue IV Lasix drip.  Had received 120 mg IV Lasix followed by the drip. -Continue strict I's and O's and daily weights, negative balance of 3.0 L   Active Problems:   Coronary artery disease, PCI to LAD in 2015 -Currently no acute chest pain, shortness of breath is improving -Cardiology following, medical management     Acute renal failure superimposed on stage 3b chronic kidney disease  (HCC) -Creatinine 2.0 at the time of admission, baseline 1.2 -Creatinine currently improving with diuresis, follow renal function    Gastric cancer (HCC) -Follows Dr. Benay Wong, appreciate following the patient.  Recommended continuing XRT, discontinue capecitabine -Outpatient follow-up at the cancer center    Cocaine abuse ([place]) -Urine toxicology screen negative, reported abstinence for a month    COPD (chronic obstructive pulmonary disease) (HCC) -Currently stable, no acute wheezing, continue as needed bronchodilators    Nicotine dependence, cigarettes, uncomplicated -Counseled on cessation    Type 2 diabetes mellitus with stage 3b chronic kidney disease, without long-term current use of insulin (HCC) -Continue sliding scale insulin -Hemoglobin A1c 6.8 on 7/27  Constipation Add MiraLAX, Senokot-S  Hypokalemia Replaced  Code Status: Full CODE STATUS DVT Prophylaxis:  enoxaparin (LOVENOX) injection 40 mg Start: 07/23/21 0600   Level of Care: Level of care: Progressive Family Communication: Discussed all imaging results, lab results, explained to the patient    Disposition Plan:     Status is: Inpatient  Remains inpatient appropriate because:Inpatient level of care appropriate due to severity of illness  Dispo: The patient is from: Home  Anticipated d/c is to: Home              Patient currently is not medically stable to d/c.,  Currently on Lasix drip   Difficult to place patient No      Time Spent in minutes   35 minutes  Procedures:  None  Consultants:   Radiation oncology Oncology Cardiology  Antimicrobials:   Anti-infectives (From admission, onward)    None          Medications  Scheduled Meds:  aspirin  81 mg Oral q morning   atorvastatin  80 mg Oral Daily   enoxaparin (LOVENOX) injection  40 mg Subcutaneous Q24H   gabapentin  100 mg Oral BID   insulin aspart  0-15 Units Subcutaneous TID AC & HS   metoCLOPramide  5 mg Oral  TID AC   pantoprazole  40 mg Oral BID   Continuous Infusions:  furosemide (LASIX) 200 mg in dextrose 5% 100 mL ('2mg'$ /mL) infusion 20 mg/hr (07/24/21 0640)   PRN Meds:.acetaminophen **OR** acetaminophen, ondansetron **OR** ondansetron (ZOFRAN) IV, polyethylene glycol      Subjective:   Tyler Wong was seen and examined today.  Ambulating in the room.  States that shortness of breath is improving.  No chest pain.  No acute nausea, vomiting, abdominal pain or diarrhea.  Feeling constipated for last 4 days.   No acute events overnight.    Objective:   Vitals:   07/23/21 2023 07/24/21 0030 07/24/21 0500 07/24/21 0533  BP: 97/68 99/68  105/78  Pulse: 89 100  100  Resp: '20 18  20  '$ Temp: 97.9 F (36.6 C) 97.9 F (36.6 C)  97.6 F (36.4 C)  TempSrc:      SpO2: 100% 100%  100%  Weight:   70.8 kg   Height:        Intake/Output Summary (Last 24 hours) at 07/24/2021 1454 Last data filed at 07/24/2021 [id number] Gross per 24 hour  Intake 228.16 ml  Output 2350 ml  Net -2121.84 ml     Wt Readings from Last 3 Encounters:  07/24/21 70.8 kg  07/17/21 72.1 kg  07/04/21 72.4 kg     Exam General: Alert and oriented x 3, NAD Cardiovascular: S1 S2 auscultated, no murmurs, RRR Respiratory: Clear to auscultation bilaterally, no wheezing, rales or rhonchi Gastrointestinal: Soft, nontender, nondistended, + bowel sounds Ext: trace pedal edema bilaterally Neuro: no acute deficits Skin: No rashes Psych: Normal affect and demeanor, alert and oriented x3    Data Reviewed:  I have personally reviewed following labs and imaging studies  Micro Results Recent Results (from the past 240 hour(s))  Resp Panel by RT-PCR (Flu A&B, Covid) Nasopharyngeal Swab     Status: None   Collection Time: 07/23/21 12:40 AM   Specimen: Nasopharyngeal Swab; Nasopharyngeal(NP) swabs in vial transport medium  Result Value Ref Range Status   SARS Coronavirus 2 by RT PCR NEGATIVE NEGATIVE Final    Comment:  (NOTE) SARS-CoV-2 target nucleic acids are NOT DETECTED.  The SARS-CoV-2 RNA is generally detectable in upper respiratory specimens during the acute phase of infection. The lowest concentration of SARS-CoV-2 viral copies this assay can detect is 138 copies/mL. A negative result does not preclude SARS-Cov-2 infection and should not be used as the sole basis for treatment or other patient management decisions. A negative result may occur with  improper specimen collection/handling, submission of specimen other than nasopharyngeal swab, presence of viral mutation(s) within the areas targeted by this assay, and inadequate  number of viral copies(<138 copies/mL). A negative result must be combined with clinical observations, patient history, and epidemiological information. The expected result is Negative.  Fact Sheet for Patients:  EntrepreneurPulse.com.au  Fact Sheet for Healthcare Providers:  IncredibleEmployment.be  This test is no t yet approved or cleared by the [place] FDA and  has been authorized for detection and/or diagnosis of SARS-CoV-2 by FDA under an Emergency Use Authorization (EUA). This EUA will remain  in effect (meaning this test can be used) for the duration of the COVID-19 declaration under Section 564(b)(1) of the Act, 21 U.S.C.section 360bbb-3(b)(1), unless the authorization is terminated  or revoked sooner.       Influenza A by PCR NEGATIVE NEGATIVE Final   Influenza B by PCR NEGATIVE NEGATIVE Final    Comment: (NOTE) The Xpert Xpress SARS-CoV-2/FLU/RSV plus assay is intended as an aid in the diagnosis of influenza from Nasopharyngeal swab specimens and should not be used as a sole basis for treatment. Nasal washings and aspirates are unacceptable for Xpert Xpress SARS-CoV-2/FLU/RSV testing.  Fact Sheet for Patients: EntrepreneurPulse.com.au  Fact Sheet for Healthcare  Providers: IncredibleEmployment.be  This test is not yet approved or cleared by the [place] FDA and has been authorized for detection and/or diagnosis of SARS-CoV-2 by FDA under an Emergency Use Authorization (EUA). This EUA will remain in effect (meaning this test can be used) for the duration of the COVID-19 declaration under Section 564(b)(1) of the Act, 21 U.S.C. section 360bbb-3(b)(1), unless the authorization is terminated or revoked.  Performed at [facility], [place] [address] [place], [place] [address]     Radiology Reports DG Chest 2 View  Result Date: 07/22/2021 CLINICAL DATA:  55 year old male with chest pain. EXAM: CHEST - 2 VIEW COMPARISON:  Chest radiograph dated 06/21/2021. FINDINGS: Left-sided Port-A-Cath with tip close to the cavoatrial junction. There is cardiomegaly with mild vascular congestion. No focal consolidation, pleural effusion, or pneumothorax. No acute osseous pathology. IMPRESSION: Cardiomegaly with mild vascular congestion. No focal consolidation. Electronically Signed   By: Anner Crete M.D.   On: 07/22/2021 15:41   US RENAL  Result Date: 07/23/2021 CLINICAL DATA:  Renal failure. EXAM: RENAL / URINARY TRACT ULTRASOUND COMPLETE COMPARISON:  Ultrasound 06/19/2021. FINDINGS: Right Kidney: Renal measurements: 9.9 x 4.4 x 4.1 cm = volume: 91.9 mL. Mild increased echogenicity cannot be excluded. No mass or hydronephrosis visualized. Left Kidney: Renal measurements: 10.7 x 5.3 x 4.6 cm = volume: 137.1 mL. Mild increased echogenicity cannot be excluded. No mass or hydronephrosis visualized. Bladder: Bladder is nondistended. Other: None. IMPRESSION: Mild increased echogenicity both kidneys cannot be excluded. Chronic medical renal disease cannot be excluded. No acute abnormality. No hydronephrosis or bladder distention. Electronically Signed   By: Marcello Moores  Register   On: 07/23/2021 06:01    Lab Data:  CBC: Recent Labs  Lab  07/22/21 1449 07/23/21 0325 07/24/21 0425  WBC 6.9 6.0 5.8  NEUTROABS  --  4.8  --   HGB 12.1* 11.1* 11.0*  HCT 37.4* 33.7* 32.9*  MCV 82.4 81.8 81.0  PLT 72* 68* 66*   Basic Metabolic Panel: Recent Labs  Lab 07/22/21 1449 07/23/21 0325 07/24/21 0425  NA 130* 132* 135  K 5.8* 4.5 3.4*  CL 95* 98 99  CO2 '22 22 26  '$ GLUCOSE 158* 170* 180*  BUN 37* 36* 35*  CREATININE 2.09* 1.88* 1.42*  CALCIUM 9.6 9.2 8.8*  MG  --  2.5* 2.6*   GFR: Estimated Creatinine Clearance: 58.9 mL/min (  A) (by C-G formula based on SCr of 1.42 mg/dL (H)). Liver Function Tests: Recent Labs  Lab 07/23/21 0325  AST 23  ALT 18  ALKPHOS 89  BILITOT 4.1*  PROT 7.0  ALBUMIN 3.6   No results for input(s): LIPASE, AMYLASE in the last 168 hours. No results for input(s): AMMONIA in the last 168 hours. Coagulation Profile: No results for input(s): INR, PROTIME in the last 168 hours. Cardiac Enzymes: No results for input(s): CKTOTAL, CKMB, CKMBINDEX, TROPONINI in the last 168 hours. BNP (last 3 results) No results for input(s): PROBNP in the last 8760 hours. HbA1C: Recent Labs    07/23/21 0325  HGBA1C 6.8*   CBG: Recent Labs  Lab 07/23/21 1221 07/23/21 1801 07/23/21 2259 07/24/21 0835 07/24/21 1157  GLUCAP 147* 121* 158* 116* 144*   Lipid Profile: No results for input(s): CHOL, HDL, LDLCALC, TRIG, CHOLHDL, LDLDIRECT in the last 72 hours. Thyroid Function Tests: No results for input(s): TSH, T4TOTAL, FREET4, T3FREE, THYROIDAB in the last 72 hours. Anemia Panel: No results for input(s): VITAMINB12, FOLATE, FERRITIN, TIBC, IRON, RETICCTPCT in the last 72 hours. Urine analysis:    Component Value Date/Time   COLORURINE YELLOW 06/17/2021 2225   APPEARANCEUR CLEAR 06/17/2021 2225   LABSPEC 1.011 06/17/2021 2225   PHURINE 5.0 06/17/2021 2225   GLUCOSEU NEGATIVE 06/17/2021 2225   HGBUR NEGATIVE 06/17/2021 [place] 06/17/2021 2225   KETONESUR NEGATIVE 06/17/2021 2225    PROTEINUR 100 (A) 06/17/2021 2225   NITRITE NEGATIVE 06/17/2021 [place] 06/17/2021 2225     Raimundo Corbit M.D. [facility] 07/24/2021, 2:54 PM  Available via Epic secure chat 7am-7pm After 7 pm, please refer to night coverage provider listed on amion.

## 2021-07-24 NOTE — Consult Note (Signed)
Consultation Note Date: 07/24/2021   Patient Name: Tyler Wong  DOB: 01-06-1966  MRN: [id number]  Age / Sex: 55 y.o., male  PCP: [name], NP Referring Physician: Mendel Corning, MD  Reason for Consultation: Establishing goals of care  HPI/Patient Profile: 55 y.o. male  with past medical history of systolic heart failure (EF 20%), gastric cancer, CKD stage III/IV, cocaine abuse, tobacco abuse, CAD status post PCI, and COPD  admitted on 07/22/2021 with shortness of breath. Found to have significant volume overload and elevated BNP. He was placed on lasix drip. For gastric CA, currently on Xeloda and radiation therapy. He reports cocaine abstinence for 1 month - UDS negative for cocaine. Patient has been living at ALF. PMT consulted to discuss [place].   Of note, we have seen Tyler Wong many times in the past - he consistently requests full code/full scope care. He has asked [place] to leave and declines repeat visits/outpatient follow up  Clinical Assessment and Goals of Care: I have reviewed medical records including EPIC notes, labs and imaging, received report from RN, assessed the patient and then met with patient  to discuss diagnosis prognosis, GOC, EOL wishes, disposition and options.  Patient is initially quite friendly and welcoming, telling me he feels very well. When I explain that I am with palliative care he says "oh no, Bye!" He then apologizes but asks why the palliative team sees him every time he is hospitalized. I share that his other providers ask [place] to join in his care for extra support d/t his severe illness. He expresses understanding but tells me he really does not want to talk to me. He apologizes and tells me he does not want to be rude; he just has no interest in discussing his care with me. I ask him if he recalls previous conversations with the palliative team and he is not sure. I summarize his previously stated wishes for him  and he confirm these wishes remain the same. I asked him about his symptoms and he reports they are well controlled. Left the room after brief conversation as [name] did not want to discuss goals of care any further.   Primary Decision Maker PATIENT  Sister [name] is patient unable  SUMMARY OF RECOMMENDATIONS   - patient continues to not want to discuss [place] with palliative team, this is consistent with multiple other visits - he did confirm his goals remain the same as previous visits: full code/full scope; would not want long-term vent support; sister [name] is HCPOA if he loses decision making ability - no plan to follow up, please call if needs arise or patient becomes open to goals of care discussion - patient has consistently declined outpatient palliative referral - did not offer today  Code Status/Advance Care Planning: Full Code     Primary Diagnoses: Present on Admission:  Acute on chronic combined systolic and diastolic CHF (congestive heart failure) ([name])  Acute renal failure superimposed on stage 3b chronic kidney disease ([name])  Coronary artery disease involving native coronary artery of native heart without angina pectoris  Hyperkalemia  Cocaine abuse (HCC)  COPD (chronic obstructive pulmonary disease) ([name])  Gastric cancer ([name])  Nicotine dependence, cigarettes, uncomplicated  Type 2 diabetes mellitus with stage 3b chronic kidney disease, without long-term current use of insulin ([name])  Acute on chronic systolic CHF (congestive heart failure) ([name])   I have reviewed the medical record, interviewed the patient and family, and examined the patient. The following aspects are pertinent.  Past Medical History:  Diagnosis Date   CAD in native artery 07/17/2014   STEMI- LAD stenosis with DES   CHF (congestive heart failure) (HCC)    Cocaine abuse (HCC)    COPD (chronic obstructive pulmonary disease) (HCC)    Diabetes mellitus without complication ([place])    Dyspnea     Gastric cancer ([place]) 11/01/2020   GERD (gastroesophageal reflux disease)    Hypertension    ST elevation myocardial infarction (STEMI) involving left anterior descending (LAD) coronary artery with complication (HCC)    Tobacco abuse 07/17/2014   Social History   Socioeconomic History   Marital status: Single    Spouse name: Not on file   Number of children: 2   Years of education: 12   Highest education level: 12th grade  Occupational History   Occupation: Landscaping  Tobacco Use   Smoking status: Every Day    Packs/day: 0.25    Years: 30.00    Pack years: 7.50    Types: Cigarettes   Smokeless tobacco: Never   Tobacco comments:    .5 PK PER DAY  Vaping Use   Vaping Use: Never used  Substance and Sexual Activity   Alcohol use: No    Comment: Patient denies abuse, states social drinker   Drug use: Yes    Types: Cocaine, "Crack" cocaine    Comment: heroin   Sexual activity: Yes  Other Topics Concern   Not on file  Social History Narrative   Lives in [place] with his sister and girlfriend. He works Aeronautical engineer work.    Social Determinants of Health   Financial Resource Strain: High Risk   Difficulty of Paying Living Expenses: Very hard  Food Insecurity: Food Insecurity Present   Worried About Charity fundraiser in the Last Year: Sometimes true   Ran Out of Food in the Last Year: Sometimes true  Transportation Needs: No Transportation Needs   Lack of Transportation (Medical): No   Lack of Transportation (Non-Medical): No  Physical Activity: Not on file  Stress: Not on file  Social Connections: Not on file   Family History  Problem Relation Age of Onset   Cirrhosis Father    Heart attack Mother    Heart attack Sister    Heart failure Sister    Heart attack Brother    Scheduled Meds:  aspirin  81 mg Oral q morning   atorvastatin  80 mg Oral Daily   enoxaparin (LOVENOX) injection  40 mg Subcutaneous Q24H   gabapentin  100 mg Oral BID    insulin aspart  0-15 Units Subcutaneous TID AC & HS   metoCLOPramide  5 mg Oral TID AC   pantoprazole  40 mg Oral BID   senna-docusate  1 tablet Oral BID   Continuous Infusions:  furosemide (LASIX) 200 mg in dextrose 5% 100 mL (35m/mL) infusion 20 mg/hr (07/24/21 0640)   PRN Meds:.acetaminophen **OR** acetaminophen, ondansetron **OR** ondansetron (ZOFRAN) IV, polyethylene glycol No Known Allergies Review of Systems  Unable to perform ROS: Other Patient declined  Physical Exam Constitutional:      General: He is not in acute distress. Pulmonary:     Effort: Pulmonary effort is normal.  Neurological:     Mental Status: He is alert and oriented to person, place, and time.    Vital Signs: BP 105/78 (BP Location: Right Arm)   Pulse 100   Temp 97.6 F (36.4 C)   Resp 20   Ht _0  (1.778 m)  Wt 70.8 kg   SpO2 100%   BMI 22.38 kg/m  Pain Scale: 0-10   Pain Score: 0-No pain   SpO2: SpO2: 100 % O2 Device:SpO2: 100 % O2 Flow Rate: .   IO: Intake/output summary:  Intake/Output Summary (Last 24 hours) at 07/24/2021 1509 Last data filed at 07/24/2021 0640 Gross per 24 hour  Intake 228.16 ml  Output 2350 ml  Net -2121.84 ml    LBM: Last BM Date: 07/21/21 Baseline Weight: Weight: 72 kg Most recent weight: Weight: 70.8 kg     Palliative Assessment/Data: PPS 50%    Time Total: 40 minutes Greater than 50%  of this time was spent counseling and coordinating care related to the above assessment and plan.  Juel Burrow, DNP, AGNP-C Palliative Medicine Team [phone] Pager: [phone]

## 2021-07-24 NOTE — Progress Notes (Addendum)
Progress Note  Patient Name: Tyler Wong Date of Encounter: 07/24/2021  [facility] Cardiologist: Shelva Majestic, MD  CHF Clinic: Dr. Aundra Dubin  Subjective   Breathing significant improved. No CP. Constipated.   Inpatient Medications    Scheduled Meds:  aspirin  81 mg Oral q morning   atorvastatin  80 mg Oral Daily   enoxaparin (LOVENOX) injection  40 mg Subcutaneous Q24H   gabapentin  100 mg Oral BID   insulin aspart  0-15 Units Subcutaneous TID AC & HS   metoCLOPramide  5 mg Oral TID AC   pantoprazole  40 mg Oral BID   Continuous Infusions:  furosemide (LASIX) 200 mg in dextrose 5% 100 mL ('2mg'$ /mL) infusion 20 mg/hr (07/24/21 0640)   PRN Meds: acetaminophen **OR** acetaminophen, ondansetron **OR** ondansetron (ZOFRAN) IV, polyethylene glycol   Vital Signs    Vitals:   07/23/21 2023 07/24/21 0030 07/24/21 0500 07/24/21 0533  BP: 97/68 99/68  105/78  Pulse: 89 100  100  Resp: '20 18  20  '$ Temp: 97.9 F (36.6 C) 97.9 F (36.6 C)  97.6 F (36.4 C)  TempSrc:      SpO2: 100% 100%  100%  Weight:   70.8 kg   Height:        Intake/Output Summary (Last 24 hours) at 07/24/2021 0902 Last data filed at 07/24/2021 0640 Gross per 24 hour  Intake 228.16 ml  Output 2350 ml  Net -2121.84 ml   Last 3 Weights 07/24/2021 07/23/2021 07/17/2021  Weight (lbs) 156 lb 158 lb 11.7 oz 159 lb  Weight (kg) 70.761 kg 72 kg 72.122 kg  Some encounter information is confidential and restricted. Go to Review Flowsheets activity to see all data.      Telemetry    NSR without significant ventricular ectopy, HR 90-100s - Personally Reviewed  ECG    Mild sinus tachycardia, poor R wave progression in the anterior leads - Personally Reviewed  Physical Exam   GEN: No acute distress.   Neck: No JVD Cardiac: RRR, no murmurs, rubs, or gallops.  Respiratory: Clear to auscultation bilaterally. GI: Soft, nontender, non-distended  MS: No edema; No deformity. Neuro:  Nonfocal  Psych: Normal  affect   Labs    High Sensitivity Troponin:   Recent Labs  Lab 07/22/21 1449 07/22/21 1649  TROPONINIHS 175* 208*      Chemistry Recent Labs  Lab 07/17/21 1025 07/22/21 1449 07/23/21 0325 07/24/21 0425  NA 136 130* 132* 135  K 4.2 5.8* 4.5 3.4*  CL 98 95* 98 99  CO2 '29 22 22 26  '$ GLUCOSE 150* 158* 170* 180*  BUN 26* 37* 36* 35*  CREATININE 1.22 2.09* 1.88* 1.42*  CALCIUM 9.1 9.6 9.2 8.8*  PROT 6.9  --  7.0  --   ALBUMIN 3.9  --  3.6  --   AST 17  --  23  --   ALT 14  --  18  --   ALKPHOS 88  --  89  --   BILITOT 2.0*  --  4.1*  --   GFRNONAA >60 37* 42* 58*  ANIONGAP '9 13 12 10     '$ Hematology Recent Labs  Lab 07/22/21 1449 07/23/21 0325 07/24/21 0425  WBC 6.9 6.0 5.8  RBC 4.54 4.12* 4.06*  HGB 12.1* 11.1* 11.0*  HCT 37.4* 33.7* 32.9*  MCV 82.4 81.8 81.0  MCH 26.7 26.9 27.1  MCHC 32.4 32.9 33.4  RDW 28.1* 28.1* 29.1*  PLT 72* 68* 66*  BNP Recent Labs  Lab 07/22/21 1459  BNP 2,978.4*     DDimer No results for input(s): DDIMER in the last 168 hours.   Radiology    DG Chest 2 View  Result Date: 07/22/2021 CLINICAL DATA:  55 year old male with chest pain. EXAM: CHEST - 2 VIEW COMPARISON:  Chest radiograph dated 06/21/2021. FINDINGS: Left-sided Port-A-Cath with tip close to the cavoatrial junction. There is cardiomegaly with mild vascular congestion. No focal consolidation, pleural effusion, or pneumothorax. No acute osseous pathology. IMPRESSION: Cardiomegaly with mild vascular congestion. No focal consolidation. Electronically Signed   By: Anner Crete M.D.   On: 07/22/2021 15:41   US RENAL  Result Date: 07/23/2021 CLINICAL DATA:  Renal failure. EXAM: RENAL / URINARY TRACT ULTRASOUND COMPLETE COMPARISON:  Ultrasound 06/19/2021. FINDINGS: Right Kidney: Renal measurements: 9.9 x 4.4 x 4.1 cm = volume: 91.9 mL. Mild increased echogenicity cannot be excluded. No mass or hydronephrosis visualized. Left Kidney: Renal measurements: 10.7 x 5.3 x 4.6 cm  = volume: 137.1 mL. Mild increased echogenicity cannot be excluded. No mass or hydronephrosis visualized. Bladder: Bladder is nondistended. Other: None. IMPRESSION: Mild increased echogenicity both kidneys cannot be excluded. Chronic medical renal disease cannot be excluded. No acute abnormality. No hydronephrosis or bladder distention. Electronically Signed   By: Marcello Moores  Register   On: 07/23/2021 06:01    Cardiac Studies   Echo 10/04/2020 1. 3 D images of the LV were obtained. . Left ventricular ejection  fraction, by estimation, is 20 to 25%. The left ventricle has severely  decreased function. The left ventricle demonstrates global hypokinesis.  The left ventricular internal cavity size  was moderately dilated. There is mild left ventricular hypertrophy. Left  ventricular diastolic parameters are consistent with Grade II diastolic  dysfunction (pseudonormalization).   2. Right ventricular systolic function is low normal. The right  ventricular size is normal. There is moderately elevated pulmonary artery  systolic pressure. The estimated right ventricular systolic pressure is  123XX123 mmHg.   3. Left atrial size was moderately dilated.   4. Right atrial size was severely dilated.   5. The mitral valve is grossly normal. Moderate to severe mitral valve  regurgitation. Mild mitral stenosis.   6. Tricuspid valve regurgitation is moderate to severe.   7. The aortic valve is normal in structure. Aortic valve regurgitation is  not visualized. No aortic stenosis is present.   Patient Profile     55 y.o. male with PMH of HFrEF (EF20-25%), mixed ICM/NICM, CAD s/p prior LAD PCI, recurrent decompensated low output HF/cardiogenic shock, gastric intramucosal adenocarcinoma, COPD, DM II, polysubstance (cocaine, tobacco, marijuana) presented with recurrent decompensated HF and cardiogenic shock  Assessment & Plan    Acute on chronic systolic CHF  - Echo [id number] EF 20-25%, mild LVH, grade 2 DD,  moderate LAE, moderate to severe MR, moderate to severe TR.   - End stage heart failure, multiple admission previously for CHF, not a candidate for inotrope due to polysubstance abuse and it is a bridge to nowhere. H/o low output CHF  - on torsemide '80mg'$  BID at facility prior to admission, patient concerned he was not given the full dose, however fellow called facility and confirmed he has been receiving torsemide '80mg'$  BID. He was given '120mg'$  IV lasix followed by '20mg'$ /hr of IV lasix here. Currently I/O net -3L. Breathing much better. Lung clear. Renal function improving. Likely at or near euvolemic level. Question comes down to the PO diuretic dosing, since patient is already near the ceiling  of diuretic dosing. Will check with MD. Agree with palliative care as patient has end stage CHF and likely will continue to have frequent admissions.   - No BB given h/o low output CHF and end stage CHF. May consider add low dose spironolactone to assist torsemide. Spironolactone previous discontinued in May due to AKI.   AKI: Cr 2 on arrival, improved with diuresis  Gastric cancer: s/p radiation and FOLFOX chemotherapy  CAD: no chest pain. H/o PCI to LAD in 2015. Last cath 09/2018, 70% PLV and 90% ost OM1, medical management.   COPD  DM II  Polysubstance use: reportedly no cocaine for 1 month. UDS negative for cocaine, positive for marijuana      For questions or updates, please contact [place] Please consult [email or web address] for contact info under        Signed, Almyra Deforest, [place]  07/24/2021, 9:02 AM    Personally seen and examined. Agree with above.  55 year old patient of Dr. Claris Gladden with polysubstance abuse cocaine, EF 25% mixed ischemic and ischemic cardiomyopathy prior LAD PCI with recurrent decompensated low output heart failure shock with gastric intramucosal adenocarcinoma.  In review of Dr. Claris Gladden note, he is not a candidate for inotropic therapy or any other advanced therapies given  his ongoing cocaine use.  He reports no cocaine over the past month.  This is good.  Thankfully, he does feel better after diuretic.  He was given 120 mg of IV Lasix followed by 20 mg grams per hour of IV Lasix drip.  -3 L.  Lungs sound better.  He is likely close to his euvolemic level.  Question comes down to overall home p.o. diuretic dosing which has been challenging.  The patient was questioning whether or not he was receiving his correct torsemide dose.  The facility did report that he was receiving 80 mg twice a day.  On discharge, we will give him metolazone 2.5 mg once a week every Monday for instance with an additional 20 mill equivalents of potassium.  I discussed this with Dr. Aundra Dubin.  He has recently seen Allena Katz, FNP in the heart failure clinic.  July 8.  We will set up close follow-up.  Let's give him one more day of IV lasix.   Candee Furbish, MD

## 2021-07-25 ENCOUNTER — Ambulatory Visit
Admission: RE | Admit: 2021-07-25 | Discharge: 2021-07-25 | Disposition: A | Discharge status: Home / Self Care | Payer: Medicaid Other | Source: Ambulatory Visit | Attending: Radiation Oncology | Admitting: Radiation Oncology

## 2021-07-25 DIAGNOSIS — N170 Acute kidney failure with tubular necrosis: Secondary | ICD-10-CM | POA: Diagnosis not present

## 2021-07-25 DIAGNOSIS — I5023 Acute on chronic systolic (congestive) heart failure: Secondary | ICD-10-CM | POA: Diagnosis not present

## 2021-07-25 DIAGNOSIS — J410 Simple chronic bronchitis: Secondary | ICD-10-CM | POA: Diagnosis not present

## 2021-07-25 DIAGNOSIS — I5043 Acute on chronic combined systolic (congestive) and diastolic (congestive) heart failure: Secondary | ICD-10-CM | POA: Diagnosis not present

## 2021-07-25 LAB — BASIC METABOLIC PANEL
Anion gap: 13 (ref 5–15)
BUN: 37 mg/dL — ABNORMAL HIGH (ref 6–20)
CO2: 23 mmol/L (ref 22–32)
Calcium: 9 mg/dL (ref 8.9–10.3)
Chloride: 92 mmol/L — ABNORMAL LOW (ref 98–111)
Creatinine, Ser: 1.79 mg/dL — ABNORMAL HIGH (ref 0.61–1.24)
GFR, Estimated: 44 mL/min — ABNORMAL LOW (ref >60)
Glucose, Bld: 119 mg/dL — ABNORMAL HIGH (ref 70–99)
Potassium: 3.8 mmol/L (ref 3.5–5.1)
Sodium: 128 mmol/L — ABNORMAL LOW (ref 135–145)

## 2021-07-25 LAB — GLUCOSE, CAPILLARY
Glucose-Capillary: 143 mg/dL — ABNORMAL HIGH (ref 70–99)
Glucose-Capillary: 117 mg/dL — ABNORMAL HIGH (ref 70–99)
Glucose-Capillary: 233 mg/dL — ABNORMAL HIGH (ref 70–99)

## 2021-07-25 MED ORDER — SORBITOL 70 % SOLN
960.0000 mL | TOPICAL_OIL | Freq: Once | ORAL | Status: DC
  Filled 2021-07-25: qty 473

## 2021-07-25 MED ORDER — FAMOTIDINE 20 MG PO TABS: 20.0000 mg | ORAL_TABLET | Freq: Every day | ORAL | 3 refills | Status: AC

## 2021-07-25 MED ORDER — METOLAZONE 2.5 MG PO TABS: 2.5000 mg | ORAL_TABLET | ORAL | 3 refills | Status: AC

## 2021-07-25 MED ORDER — METOCLOPRAMIDE HCL 5 MG PO TABS: 5.0000 mg | ORAL_TABLET | Freq: Three times a day (TID) | ORAL | 0 refills | Status: AC | PRN

## 2021-07-25 MED ORDER — SENNOSIDES-DOCUSATE SODIUM 8.6-50 MG PO TABS: 1.0000 | ORAL_TABLET | Freq: Two times a day (BID) | ORAL | 3 refills | Status: AC

## 2021-07-25 MED ORDER — FAMOTIDINE 20 MG PO TABS
20.0000 mg | ORAL_TABLET | Freq: Every day | ORAL | Status: DC
  Administered 2021-07-25: 20 mg via ORAL
  Filled 2021-07-25: qty 1

## 2021-07-25 NOTE — TOC Transition Note (Signed)
Transition of Care [facility]) - CM/SW Discharge Note   Patient Details  Name: Tyler Wong MRN: [id number] Date of Birth: 24-Sep-1966  [facility]) CM/SW Contact:  Ross Ludwig, LCSW Phone Number: 07/25/2021, 3:59 PM   Clinical Narrative:     CSW spoke to Richardson Landry at [facility] [place], [phone], they can accept patient back today.  ALF requested that [facility] fax DC summary, FL2, and Covid result to [phone].  CSW faxed requested information to ALF.  The address is [address] [place], [place], [address], ALF can accept him back today, however he will need a ride back via family or [facility] [employer], per ALF, they do not need a nurse to call report.  CSW updated bedside nurse.    Final next level of care: Assisted Living Barriers to Discharge: Barriers Resolved   Patient Goals and CMS Choice Patient states their goals for this hospitalization and ongoing recovery are:: To return back to [facility]      Discharge Placement  [facility].                     Discharge Plan and Services                                     Social Determinants of Health (SDOH) Interventions     Readmission Risk Interventions Readmission Risk Prevention Plan 06/20/2021 05/20/2021 11/04/2020  Transportation Screening Complete Complete Complete  Medication Review Press photographer) Complete - Complete  PCP or Specialist appointment within 3-5 days of discharge Complete Complete Complete  HRI or Home Care Consult Complete Complete Complete  SW Recovery Care/Counseling Consult Complete Complete Complete  Palliative Care Screening Not Applicable Not Applicable Not [place] Not Applicable Not Applicable Not Applicable  Some recent data might be hidden

## 2021-07-25 NOTE — Plan of Care (Signed)
  Problem: Activity: Goal: Capacity to carry out activities will improve Outcome: Progressing   Problem: Cardiac: Goal: Ability to achieve and maintain adequate cardiopulmonary perfusion will improve Outcome: Progressing   Problem: Education: Goal: Knowledge of General Education information will improve Description: Including pain rating scale, medication(s)/side effects and non-pharmacologic comfort measures Outcome: Progressing   Problem: Clinical Measurements: Goal: Ability to maintain clinical measurements within normal limits will improve Outcome: Progressing Goal: Will remain free from infection Outcome: Progressing Goal: Diagnostic test results will improve Outcome: Progressing Goal: Respiratory complications will improve Outcome: Progressing Goal: Cardiovascular complication will be avoided Outcome: Progressing   Problem: Activity: Goal: Risk for activity intolerance will decrease Outcome: Progressing

## 2021-07-25 NOTE — Consult Note (Addendum)
   [facility] CM Inpatient Consult   07/25/2021  Tyler Wong 1966/04/21 [id number]   Addendum:  *Coverage for [name], [facility] Liaison for [name]  Managed Medicaid:  Healthy Blue  Extreme high risk for unplanned readmission score noted  Patient was screened for post hospital needs in the Healthy [name] MM plan.  Patient continues to be followed by the HF team. Patient followed by [name] Nurse program for transportation noted  Patient to transition to [facility].  [name], RN BSN [facility] Liaison  [phone] business mobile phone Toll free office [phone]  Fax number: [phone] [name]

## 2021-07-25 NOTE — NC FL2 (Signed)
[place] LEVEL OF CARE SCREENING TOOL     IDENTIFICATION  Patient Name: Tyler Wong Birthdate: [birth year] Sex: male Admission Date (Current Location): 07/22/2021  [place] and [place] Number:  Kathleen Argue [id number] Facility and Address:  [facility],  [place], [place]      Provider Number: [id number]  Attending Physician Name and Address:  Mendel Corning, MD  Relative Name and Phone Number:  Eulah Citizen [phone]    Alethia Berthold Sister   [phone]    Current Level of Care: Hospital Recommended Level of Care: [place] ([place]) Prior Approval Number:    Date Approved/Denied:   PASRR Number:    Discharge Plan: Other (Comment) ([facility])    Current Diagnoses: Patient Active Problem List   Diagnosis Date Noted   Nicotine dependence, cigarettes, uncomplicated [id number]   Type 2 diabetes mellitus with stage 3b chronic kidney disease, without long-term current use of insulin ([place]) 07/23/2021   Hyperkalemia 06/18/2021   Lactic acidosis 06/18/2021   RUQ abdominal pain    Cocaine abuse ([place]) 06/17/2021   COPD (chronic obstructive pulmonary disease) ([place]) 06/17/2021   Esophageal candidiasis ([place]) 06/17/2021   Port-A-Cath in place 05/22/2021   Acute dyspnea    CHF exacerbation ([place]) 05/13/2021   Gastric cancer ([place]) 11/29/2020   Abdominal pain, generalized 11/05/2020   Congestive heart failure (CHF) ([place]) 11/01/2020   CHF (congestive heart failure) ([place]) 10/31/2020   Palliative care by specialist    Encounter for laboratory testing for COVID-19 virus    Acute systolic HF (heart failure) ([place]) 10/03/2020   Acute on chronic systolic CHF (congestive heart failure) ([place]) 10/03/2020   Acute renal failure superimposed on stage 3b chronic kidney disease ([place])    Acute respiratory disease due to COVID-19 virus 09/20/2020   Acute respiratory failure due to COVID-19 ([place]) 09/19/2020    Cardiorenal syndrome 06/29/2020   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   Acute on chronic clinical systolic heart failure ([place]) 06/22/2020   Acute on chronic combined systolic and diastolic CHF (congestive heart failure) ([place]) 06/22/2020   Palliative care encounter    Cardiogenic shock ([place]) 04/11/2020   CKD (chronic kidney disease) stage 3, GFR 30-59 ml/min (HCC) 04/11/2020   Symptomatic anemia 04/09/2020   Acute on chronic HFrEF (heart failure with reduced ejection fraction) ([place]) 05/05/2019   HFrEF (heart failure with reduced ejection fraction) ([place]) 05/04/2019   Acute combined systolic and diastolic congestive heart failure ([place]) 10/02/2018   Hepatitis C 04/13/2017   Current non-adherence to medical treatment 04/13/2017   Acute on chronic combined systolic (congestive) and diastolic (congestive) heart failure ([place]) 04/13/2017   HTN (hypertension) 04/12/2017   Insomnia 01/18/2017   Chronic combined systolic and diastolic congestive heart failure ([place]) 01/04/2017   Hyperlipidemia 01/04/2017   Chest pain 12/30/2016   Coronary artery disease involving native coronary artery of native heart without angina pectoris 07/18/2014   Tobacco abuse 07/17/2014   Cocaine use 07/17/2014    Orientation RESPIRATION BLADDER Height & Weight     Self, Time, Situation, Place  Normal Continent Weight: 156 lb (70.8 kg) Height:  '5\' 10"'$  (177.8 cm)  BEHAVIORAL SYMPTOMS/MOOD NEUROLOGICAL BOWEL NUTRITION STATUS      Continent Diet (Regular)  AMBULATORY STATUS COMMUNICATION OF NEEDS Skin   Independent Verbally Normal                       Personal Care Assistance Level of Assistance  Bathing,  Feeding, Dressing Bathing Assistance: Independent Feeding assistance: Independent Dressing Assistance: Independent     Functional Limitations Info  Sight, Hearing, Speech Sight Info: Adequate Hearing Info: Adequate Speech Info: Adequate    SPECIAL CARE FACTORS FREQUENCY                        Contractures      Additional Factors Info  Code Status, Allergies Code Status Info: Full Code Allergies Info: NKA           Current Medications (07/25/2021):  This is the current hospital active medication list Current Facility-Administered Medications  Medication Dose Route Frequency Provider Last Rate Last Admin   acetaminophen (TYLENOL) tablet 650 mg  650 mg Oral Q6H PRN Shalhoub, Sherryll Burger, MD       Or   acetaminophen (TYLENOL) suppository 650 mg  650 mg Rectal Q6H PRN Shalhoub, Sherryll Burger, MD       aspirin chewable tablet 81 mg  81 mg Oral q morning Shalhoub, Sherryll Burger, MD   81 mg at 07/25/21 0953   atorvastatin (LIPITOR) tablet 80 mg  80 mg Oral Daily Vernelle Emerald, MD   80 mg at 07/25/21 0943   enoxaparin (LOVENOX) injection 40 mg  40 mg Subcutaneous Q24H Vernelle Emerald, MD   40 mg at 07/25/21 0559   famotidine (PEPCID) tablet 20 mg  20 mg Oral Daily Rai, Ripudeep K, MD   20 mg at 07/25/21 0943   furosemide (LASIX) 200 mg in dextrose 5 % 100 mL (2 mg/mL) infusion  20 mg/hr Intravenous Continuous Shalhoub, Sherryll Burger, MD 10 mL/hr at 07/25/21 0348 20 mg/hr at 07/25/21 0348   gabapentin (NEURONTIN) capsule 100 mg  100 mg Oral BID Vernelle Emerald, MD   100 mg at 07/25/21 0943   insulin aspart (novoLOG) injection 0-15 Units  0-15 Units Subcutaneous TID AC & HS Shalhoub, Sherryll Burger, MD   2 Units at 07/24/21 1705   metoCLOPramide (REGLAN) tablet 5 mg  5 mg Oral TID AC Shalhoub, Sherryll Burger, MD   5 mg at 07/25/21 0944   ondansetron (ZOFRAN) tablet 4 mg  4 mg Oral Q6H PRN Vernelle Emerald, MD       Or   ondansetron [facility]) injection 4 mg  4 mg Intravenous Q6H PRN Shalhoub, Sherryll Burger, MD       pantoprazole (PROTONIX) EC tablet 40 mg  40 mg Oral BID Vernelle Emerald, MD   40 mg at 07/25/21 0943   polyethylene glycol (MIRALAX / GLYCOLAX) packet 17 g  17 g Oral Daily PRN Vernelle Emerald, MD   17 g at 07/25/21 0351   senna-docusate (Senokot-S) tablet 1 tablet  1  tablet Oral BID Rai, Ripudeep K, MD   1 tablet at 07/25/21 0943   sorbitol, milk of mag, mineral oil, glycerin (SMOG) enema  960 mL Rectal Once Rai, Ripudeep K, MD         Discharge Medications: STOP taking these medications     capecitabine 500 MG tablet Commonly known as: XELODA           TAKE these medications     Aspirin Low Dose 81 MG EC tablet Generic drug: aspirin TAKE 1 TABLET (81 MG TOTAL) BY MOUTH DAILY (MORNING) What changed: See the new instructions.    atorvastatin 80 MG tablet Commonly known as: LIPITOR Take 1 tablet (80 mg total) by mouth daily.    empagliflozin 10 MG Tabs tablet  Commonly known as: Jardiance Take 1 tablet (10 mg total) by mouth daily.    famotidine 20 MG tablet Commonly known as: PEPCID Take 1 tablet (20 mg total) by mouth daily. Start taking on: July 26, 2021    gabapentin 100 MG capsule Commonly known as: NEURONTIN Take 1 capsule (100 mg total) by mouth 2 (two) times daily.    metoCLOPramide 5 MG tablet Commonly known as: Reglan Take 1 tablet (5 mg total) by mouth every 8 (eight) hours as needed for nausea or vomiting.    metolazone 2.5 MG tablet Commonly known as: ZAROXOLYN Take 1 tablet (2.5 mg total) by mouth once a week. On every Monday morning.  Take 30 minutes prior to taking AM dose of torsemide.    multivitamin with minerals tablet Take 1 tablet by mouth daily.    nicotine 7 mg/24hr patch Commonly known as: NICODERM CQ - dosed in mg/24 hr Place 1 patch (7 mg total) onto the skin daily.    pantoprazole 40 MG tablet Commonly known as: PROTONIX Take 1 tablet (40 mg total) by mouth 2 (two) times daily.    polyethylene glycol 17 g packet Commonly known as: MiraLax Take 17 g by mouth daily as needed. What changed: reasons to take this    potassium chloride SA 20 MEQ tablet Commonly known as: KLOR-CON Take 2 tablets (40 mEq total) by mouth daily.    ProAir HFA 108 (90 Base) MCG/ACT inhaler Generic drug:  albuterol INHALE 2 PUFFS INTO THE LUNGS EVERY 4 (FOUR) HOURS AS NEEDED FOR WHEEZING OR SHORTNESS OF BREATH.    senna-docusate 8.6-50 MG tablet Commonly known as: Senokot-S Take 1 tablet by mouth 2 (two) times daily. For constipation    Torsemide 40 MG Tabs Take 80 mg by mouth 2 (two) times daily.      Relevant Imaging Results:  Relevant Lab Results:   Additional Information SSN [id number]  Ross Ludwig, LCSW

## 2021-07-25 NOTE — Discharge Summary (Signed)
Physician Discharge Summary   Patient ID: Tyler Wong MRN: [id number] DOB/AGE: 1966-07-21 55 y.o.  Admit date: 07/22/2021 Discharge date: 07/25/2021  Primary Care Physician:  [name], NP   Recommendations for Outpatient Follow-up:  Follow up with PCP in 1-2 weeks Please obtain BMP in 1 week Added metolazone 2.5 mg once weekly on Mondays Xeloda currently held per oncology recommendations  Home Health: Ambulating in the room without any difficulty Equipment/Devices:   Discharge Condition: stable  CODE STATUS: FULL Diet recommendation: Heart healthy diet   Discharge Diagnoses:     Acute on chronic combined systolic and diastolic CHF (congestive heart failure) ([place])  Acute renal failure superimposed on stage 3b chronic kidney disease ([place])  Coronary artery disease involving native coronary artery of native heart without angina pectoris  Hyperkalemia  Cocaine abuse (HCC)  COPD (chronic obstructive pulmonary disease) (HCC)  Gastric cancer (HCC)  Nicotine dependence, cigarettes, uncomplicated  Type 2 diabetes mellitus with stage 3b chronic kidney disease, without long-term current use of insulin ([place])    Consults: Cardiology Oncology, Dr. Benay Spice    Allergies:  No Known Allergies   DISCHARGE MEDICATIONS: Allergies as of 07/25/2021   No Known Allergies      Medication List     STOP taking these medications    capecitabine 500 MG tablet Commonly known as: XELODA       TAKE these medications    Aspirin Low Dose 81 MG EC tablet Generic drug: aspirin TAKE 1 TABLET (81 MG TOTAL) BY MOUTH DAILY (MORNING) What changed: See the new instructions.   atorvastatin 80 MG tablet Commonly known as: LIPITOR Take 1 tablet (80 mg total) by mouth daily.   empagliflozin 10 MG Tabs tablet Commonly known as: Jardiance Take 1 tablet (10 mg total) by mouth daily.   famotidine 20 MG tablet Commonly known as: PEPCID Take 1 tablet (20 mg total) by mouth  daily. Start taking on: July 26, 2021   gabapentin 100 MG capsule Commonly known as: NEURONTIN Take 1 capsule (100 mg total) by mouth 2 (two) times daily.   metoCLOPramide 5 MG tablet Commonly known as: Reglan Take 1 tablet (5 mg total) by mouth every 8 (eight) hours as needed for nausea or vomiting.   metolazone 2.5 MG tablet Commonly known as: ZAROXOLYN Take 1 tablet (2.5 mg total) by mouth once a week. On every Monday morning.  Take 30 minutes prior to taking AM dose of torsemide.   multivitamin with minerals tablet Take 1 tablet by mouth daily.   nicotine 7 mg/24hr patch Commonly known as: NICODERM CQ - dosed in mg/24 hr Place 1 patch (7 mg total) onto the skin daily.   pantoprazole 40 MG tablet Commonly known as: PROTONIX Take 1 tablet (40 mg total) by mouth 2 (two) times daily.   polyethylene glycol 17 g packet Commonly known as: MiraLax Take 17 g by mouth daily as needed. What changed: reasons to take this   potassium chloride SA 20 MEQ tablet Commonly known as: KLOR-CON Take 2 tablets (40 mEq total) by mouth daily.   ProAir HFA 108 (90 Base) MCG/ACT inhaler Generic drug: albuterol INHALE 2 PUFFS INTO THE LUNGS EVERY 4 (FOUR) HOURS AS NEEDED FOR WHEEZING OR SHORTNESS OF BREATH.   senna-docusate 8.6-50 MG tablet Commonly known as: Senokot-S Take 1 tablet by mouth 2 (two) times daily. For constipation   Torsemide 40 MG Tabs Take 80 mg by mouth 2 (two) times daily.  Brief H and P: For complete details please refer to admission H and P, but in brief  Patient is a 55 year old male with a gastric CA, diagnosed in 10/2020, CKD stage IIIb, advanced cardiomyopathy with systolic CHF, echo [id number] EF 20 to 25%, CAD, substance abuse, COPD, GERD presented to [facility] with complaints of shortness of breath.  Patient had reported progressive dyspnea over the past week beyond his baseline, orthopnea, PND and worsening lower extremity edema. In ED, patient was  found to have significant volume overload, elevated BNP and vascular congestion on the x-ray.   Patient was admitted for further work-up, CHF team/cardiology was consulted and placed on Lasix drip For gastric CA, currently on Xeloda and radiation therapy, patient was transferred to [facility] to continue XRT.    Hospital Course:    Acute on chronic combined systolic and diastolic CHF (HCC) -Known history of advanced cardiomyopathy, severely depressed EF, presented with markedly elevated BNP, worsening dyspnea, orthopnea, PND -Patient was followed by cardiology/CHF team, started on IV Lasix drip.  He had received 120 mg IV Lasix in ED.   -Negative balance of 3.3 L.  Cardiology recommended continue torsemide 80 mg twice daily and added metolazone 2.5 mg weekly on Mondays.  Cleared by cardiology to be discharged home      Coronary artery disease, PCI to LAD in 2015 -Currently no acute chest pain, shortness of breath is improving -Medical management per cardiology       Acute renal failure superimposed on stage 3b chronic kidney disease ([place]) -Creatinine 2.0 at the time of admission, baseline 1.2 -Creatinine 1.79, Lasix drip currently discontinued.  Cardiology recommended transition to oral torsemide and added metolazone     Gastric cancer (HCC) -Follows Dr. Benay Spice, appreciate following the patient.   -Recommended continuing XRT, discontinue capecitabine, possibly side effects -Outpatient follow-up at the cancer center     Cocaine abuse ([place]) -Urine toxicology screen negative, reported abstinence for a month     COPD (chronic obstructive pulmonary disease) ([place]) -Stable, no wheezing, continue as needed bronchodilators     Nicotine dependence, cigarettes, uncomplicated -Counseled on cessation     Type 2 diabetes mellitus with stage 3b chronic kidney disease, without long-term current use of insulin ([place]) -Patient was placed on sliding scale insulin while inpatient -Hemoglobin A1c  6.8 on 7/27   Constipation Received MiraLAX, Senokot, constipation resolved prior to discharge.   Hypokalemia Replaced    Day of Discharge S: Complains of constipation in the morning, now resolved with MiraLAX and Colace.  Cleared by cardiology to be discharged home.  BP 95/72 (BP Location: Right Arm)   Pulse 100   Temp (!) 97.5 F (36.4 C) (Oral)   Resp 20   Ht '5\' 10"'$  (1.778 m)   Wt 70.8 kg   SpO2 100%   BMI 22.38 kg/m   Physical Exam: General: Alert and awake oriented x3 not in any acute distress. CVS: S1-S2 clear, RRR, mild tachycardia Chest: clear to auscultation bilaterally, no wheezing rales or rhonchi Abdomen: soft nontender, nondistended, normal bowel sounds Extremities: no cyanosis, clubbing or edema noted bilaterally Neuro: no new deficits    Get Medicines reviewed and adjusted: Please take all your medications with you for your next visit with your Primary MD  Please request your Primary MD to go over all hospital tests and procedure/radiological results at the follow up. Please ask your Primary MD to get all Hospital records sent to his/her office.  If you experience worsening of your admission  symptoms, develop shortness of breath, life threatening emergency, suicidal or homicidal thoughts you must seek medical attention immediately by calling 911 or calling your MD immediately  if symptoms less severe.  You must read complete instructions/literature along with all the possible adverse reactions/side effects for all the Medicines you take and that have been prescribed to you. Take any new Medicines after you have completely understood and accept all the possible adverse reactions/side effects.   Do not drive when taking pain medications.   Do not take more than prescribed Pain, Sleep and Anxiety Medications  Special Instructions: If you have smoked or chewed Tobacco  in the last 2 yrs please stop smoking, stop any regular Alcohol  and or any Recreational  drug use.  Wear Seat belts while driving.  Please note  You were cared for by a hospitalist during your hospital stay. Once you are discharged, your primary care physician will handle any further medical issues. Please note that NO REFILLS for any discharge medications will be authorized once you are discharged, as it is imperative that you return to your primary care physician (or establish a relationship with a primary care physician if you do not have one) for your aftercare needs so that they can reassess your need for medications and monitor your lab values.   The results of significant diagnostics from this hospitalization (including imaging, microbiology, ancillary and laboratory) are listed below for reference.      Procedures/Studies:  DG Chest 2 View  Result Date: 07/22/2021 CLINICAL DATA:  55 year old male with chest pain. EXAM: CHEST - 2 VIEW COMPARISON:  Chest radiograph dated 06/21/2021. FINDINGS: Left-sided Port-A-Cath with tip close to the cavoatrial junction. There is cardiomegaly with mild vascular congestion. No focal consolidation, pleural effusion, or pneumothorax. No acute osseous pathology. IMPRESSION: Cardiomegaly with mild vascular congestion. No focal consolidation. Electronically Signed   By: Anner Crete M.D.   On: 07/22/2021 15:41   US RENAL  Result Date: 07/23/2021 CLINICAL DATA:  Renal failure. EXAM: RENAL / URINARY TRACT ULTRASOUND COMPLETE COMPARISON:  Ultrasound 06/19/2021. FINDINGS: Right Kidney: Renal measurements: 9.9 x 4.4 x 4.1 cm = volume: 91.9 mL. Mild increased echogenicity cannot be excluded. No mass or hydronephrosis visualized. Left Kidney: Renal measurements: 10.7 x 5.3 x 4.6 cm = volume: 137.1 mL. Mild increased echogenicity cannot be excluded. No mass or hydronephrosis visualized. Bladder: Bladder is nondistended. Other: None. IMPRESSION: Mild increased echogenicity both kidneys cannot be excluded. Chronic medical renal disease cannot be  excluded. No acute abnormality. No hydronephrosis or bladder distention. Electronically Signed   By: Marcello Moores  Register   On: 07/23/2021 06:01      LAB RESULTS: Basic Metabolic Panel: Recent Labs  Lab 07/24/21 0425 07/25/21 0722  NA 135 128*  K 3.4* 3.8  CL 99 92*  CO2 26 23  GLUCOSE 180* 119*  BUN 35* 37*  CREATININE 1.42* 1.79*  CALCIUM 8.8* 9.0  MG 2.6*  --    Liver Function Tests: Recent Labs  Lab 07/23/21 0325  AST 23  ALT 18  ALKPHOS 89  BILITOT 4.1*  PROT 7.0  ALBUMIN 3.6   No results for input(s): LIPASE, AMYLASE in the last 168 hours. No results for input(s): AMMONIA in the last 168 hours. CBC: Recent Labs  Lab 07/23/21 0325 07/24/21 0425  WBC 6.0 5.8  NEUTROABS 4.8  --   HGB 11.1* 11.0*  HCT 33.7* 32.9*  MCV 81.8 81.0  PLT 68* 66*   Cardiac Enzymes: No results for input(s):  CKTOTAL, CKMB, CKMBINDEX, TROPONINI in the last 168 hours. BNP: Invalid input(s): POCBNP CBG: Recent Labs  Lab 07/25/21 0733 07/25/21 1144  GLUCAP 117* 233*       Disposition and Follow-up: Discharge Instructions     (HEART FAILURE PATIENTS) Call MD:  Anytime you have any of the following symptoms: 1) 3 pound weight gain in 24 hours or 5 pounds in 1 week 2) shortness of breath, with or without a dry hacking cough 3) swelling in the hands, feet or stomach 4) if you have to sleep on extra pillows at night in order to breathe.   Complete by: As directed    Diet - low sodium heart healthy   Complete by: As directed    Increase activity slowly   Complete by: As directed         DISPOSITION: Home   DISCHARGE FOLLOW-UP  Follow-up Information     [name], NP. Schedule an appointment as soon as possible for a visit in 2 week(s).   Specialty: Internal Medicine Why: for hospital follow-up Contact information: [address] [place] [address] [phone]         [name], MD .   Specialty: Cardiology Contact information: [address] [place] [address] [phone]         [name], MD. Schedule an appointment as soon as possible for a visit in 1 week(s).   Specialty: Cardiology Why: for hospital follow-up Contact information: [place] [address] [phone]                  Time coordinating discharge:  35 minutes  Signed:   Estill Cotta M.D. [facility] 07/25/2021, 2:35 PM

## 2021-07-25 NOTE — Progress Notes (Addendum)
Progress Note  Patient Name: Tyler Wong Date of Encounter: 07/25/2021  [facility] Cardiologist: Shelva Majestic, MD  CHF Clinic: Dr. Aundra Dubin  Subjective   Still mildly SOB, majority of the issue now is abdominal discomfort. Had a small BM this morning after miralax. Waiting on a enema later.   Inpatient Medications    Scheduled Meds:  aspirin  81 mg Oral q morning   atorvastatin  80 mg Oral Daily   enoxaparin (LOVENOX) injection  40 mg Subcutaneous Q24H   famotidine  20 mg Oral Daily   gabapentin  100 mg Oral BID   insulin aspart  0-15 Units Subcutaneous TID AC & HS   metoCLOPramide  5 mg Oral TID AC   pantoprazole  40 mg Oral BID   senna-docusate  1 tablet Oral BID   sorbitol, milk of mag, mineral oil, glycerin (SMOG) enema  960 mL Rectal Once   Continuous Infusions:  furosemide (LASIX) 200 mg in dextrose 5% 100 mL ('2mg'$ /mL) infusion 20 mg/hr (07/25/21 0348)   PRN Meds: acetaminophen **OR** acetaminophen, ondansetron **OR** ondansetron (ZOFRAN) IV, polyethylene glycol   Vital Signs    Vitals:   07/24/21 0500 07/24/21 0533 07/24/21 2110 07/25/21 0547  BP:  1'05/78 96/70 95/72 '$  Pulse:  100 95 100  Resp:  20    Temp:  97.6 F (36.4 C) 97.7 F (36.5 C) (!) 97.5 F (36.4 C)  TempSrc:   Axillary Oral  SpO2:  100% 98% 100%  Weight: 70.8 kg   70.8 kg  Height:        Intake/Output Summary (Last 24 hours) at 07/25/2021 0803 Last data filed at 07/25/2021 0600 Gross per 24 hour  Intake 240 ml  Output 350 ml  Net -110 ml   Last 3 Weights 07/25/2021 07/24/2021 07/23/2021  Weight (lbs) 156 lb 156 lb 158 lb 11.7 oz  Weight (kg) 70.761 kg 70.761 kg 72 kg  Some encounter information is confidential and restricted. Go to Review Flowsheets activity to see all data.      Telemetry    Sinus tachycardia with HR 100s - Personally Reviewed  ECG    7/26 sinus tachycardia with HR 110s - Personally Reviewed  Physical Exam   GEN: No acute distress.   Neck: No JVD Cardiac:  mildly tachycardic, no murmurs, rubs, or gallops.  Respiratory: Clear to auscultation bilaterally. GI: Soft, non-distended. Diffuse tenderness MS: No edema; No deformity. Neuro:  Nonfocal  Psych: Normal affect   Labs    High Sensitivity Troponin:   Recent Labs  Lab 07/22/21 1449 07/22/21 1649  TROPONINIHS 175* 208*      Chemistry Recent Labs  Lab 07/22/21 1449 07/23/21 0325 07/24/21 0425  NA 130* 132* 135  K 5.8* 4.5 3.4*  CL 95* 98 99  CO2 '22 22 26  '$ GLUCOSE 158* 170* 180*  BUN 37* 36* 35*  CREATININE 2.09* 1.88* 1.42*  CALCIUM 9.6 9.2 8.8*  PROT  --  7.0  --   ALBUMIN  --  3.6  --   AST  --  23  --   ALT  --  18  --   ALKPHOS  --  89  --   BILITOT  --  4.1*  --   GFRNONAA 37* 42* 58*  ANIONGAP '13 12 10     '$ Hematology Recent Labs  Lab 07/22/21 1449 07/23/21 0325 07/24/21 0425  WBC 6.9 6.0 5.8  RBC 4.54 4.12* 4.06*  HGB 12.1* 11.1* 11.0*  HCT 37.4* 33.7*  32.9*  MCV 82.4 81.8 81.0  MCH 26.7 26.9 27.1  MCHC 32.4 32.9 33.4  RDW 28.1* 28.1* 29.1*  PLT 72* 68* 66*    BNP Recent Labs  Lab 07/22/21 1459  BNP 2,978.4*     DDimer No results for input(s): DDIMER in the last 168 hours.   Radiology    No results found.  Cardiac Studies   Echo 10/04/2020 1. 3 D images of the LV were obtained. . Left ventricular ejection  fraction, by estimation, is 20 to 25%. The left ventricle has severely  decreased function. The left ventricle demonstrates global hypokinesis.  The left ventricular internal cavity size  was moderately dilated. There is mild left ventricular hypertrophy. Left  ventricular diastolic parameters are consistent with Grade II diastolic  dysfunction (pseudonormalization).   2. Right ventricular systolic function is low normal. The right  ventricular size is normal. There is moderately elevated pulmonary artery  systolic pressure. The estimated right ventricular systolic pressure is  [id number] mmHg.   3. Left atrial size was moderately  dilated.   4. Right atrial size was severely dilated.   5. The mitral valve is grossly normal. Moderate to severe mitral valve  regurgitation. Mild mitral stenosis.   6. Tricuspid valve regurgitation is moderate to severe.   7. The aortic valve is normal in structure. Aortic valve regurgitation is  not visualized. No aortic stenosis is present.   Patient Profile     55 y.o. male with PMH of HFrEF (EF20-25%), mixed ICM/NICM, CAD s/p prior LAD PCI, recurrent decompensated low output HF/cardiogenic shock, gastric intramucosal adenocarcinoma, COPD, DM II, polysubstance (cocaine, tobacco, marijuana) presented with recurrent decompensated HF and AKI  Assessment & Plan    Acute on chronic systolic CHF             - Echo 10/04/2020 EF 20-25%, mild LVH, grade 2 DD, moderate LAE, moderate to severe MR, moderate to severe TR.             - End stage heart failure, multiple admission previously for CHF, not a candidate for inotrope due to polysubstance abuse and it is a bridge to nowhere. H/o low output CHF             - on torsemide '80mg'$  BID at facility prior to admission, patient concerned he was not given the full dose, however fellow called facility and confirmed he has been receiving torsemide '80mg'$  BID. He was given '120mg'$  IV lasix followed by '20mg'$ /hr of IV lasix here. Currently I/O net -3L.              - No BB given h/o low output CHF and end stage CHF.   - at euvolemic level at this point, may transition back to '80mg'$  BID torsemide with once weekly dosing of 2.5 mg metolazone taken 30 min prior to the morning torsemide every Monday. Seen by palliative care team yesterday, wish to remain full code, no long term vent support. Declined outpatient palliative referral, declined discussion of GOC.    AKI: Cr 2 on arrival, improved with diuresis. BMET pending this morning   Gastric cancer: s/p radiation and FOLFOX chemotherapy   CAD: no chest pain. H/o PCI to LAD in 2015. Last cath 09/2018, 70% PLV and  90% ost OM1, medical management.   COPD   DM II   Polysubstance use: reportedly no cocaine for 1 month. UDS negative for cocaine, positive for marijuana     For questions or updates, please contact  [facility] Please consult [email or web address] for contact info under        Signed, Almyra Deforest, [place]  07/25/2021, 8:03 AM    Personally seen and examined. Agree with above.  Appreciate palliative care team seeing him.  He wants to be full code.  He is not a candidate for home inotropes or advanced therapies.  Continue with current medical management.  No beta-blocker secondary to low output heart failure.  I discussed case with Dr. Aundra Dubin.  80 mg twice daily of torsemide with once weekly 2.5 mg of metolazone makes sense.  The metolazone would be new to him. Main complaint at this point is constipation. Currently in bathroom. Hopefully regimen is now working.  Seems reasonable for discharge from cardiac standpoint.  We will go ahead and sign off.  When discharged, torsemide 80 mg twice a day with once weekly metolazone 2.5 mg on Monday.  He will have close follow-up in advanced heart failure clinic.  Candee Furbish, MD

## 2021-07-25 NOTE — Progress Notes (Signed)
Provided and discussed discharge information. Addressed all questions and concerns. IV removed intact. Jerene Pitch

## 2021-07-28 ENCOUNTER — Ambulatory Visit: Payer: Medicaid Other

## 2021-07-28 ENCOUNTER — Telehealth: Payer: Self-pay

## 2021-07-28 ENCOUNTER — Other Ambulatory Visit (HOSPITAL_COMMUNITY): Payer: Self-pay

## 2021-07-28 NOTE — Telephone Encounter (Signed)
Transition Care Management Unsuccessful Follow-up Telephone Call  Date of discharge and from where:  07/25/2021, [facility]   Attempts:  1st Attempt  Reason for unsuccessful TCM follow-up call:  Left voice message on # [phone].  Patient currently residing at [facility].  This CM spoke to [name], RN/Congreg[facility] Nurse Program and scheduled patient for follow up appointment with Juluis Mire, NP 08/06/2021 @ 1450. Mariann Laster to arrange transportation for patient.

## 2021-07-29 ENCOUNTER — Other Ambulatory Visit: Payer: Self-pay | Admitting: Oncology

## 2021-07-29 ENCOUNTER — Ambulatory Visit: Payer: Medicaid Other | Attending: Radiation Oncology

## 2021-07-29 ENCOUNTER — Ambulatory Visit: Payer: Medicaid Other

## 2021-07-29 ENCOUNTER — Other Ambulatory Visit (HOSPITAL_COMMUNITY): Payer: Self-pay | Admitting: Cardiology

## 2021-07-29 ENCOUNTER — Telehealth: Payer: Self-pay

## 2021-07-29 DIAGNOSIS — C163 Malignant neoplasm of pyloric antrum: Secondary | ICD-10-CM | POA: Insufficient documentation

## 2021-07-29 DIAGNOSIS — Z510 Encounter for antineoplastic radiation therapy: Secondary | ICD-10-CM | POA: Insufficient documentation

## 2021-07-29 NOTE — Telephone Encounter (Signed)
Transition Care Management Unsuccessful Follow-up Telephone Call  Date of discharge and from where:  07/25/2021, [facility]   Attempts:  2nd Attempt  Reason for unsuccessful TCM follow-up call:  Unable to leave message,voicemail full # [phone].  Call also placed to # [phone] and the phone just rings and no option for voicemail   He has appointment with Juluis Mire, NP 08/06/2021 @ [facility].  Patient currently residing at [place]

## 2021-07-30 ENCOUNTER — Telehealth: Payer: Self-pay

## 2021-07-30 ENCOUNTER — Inpatient Hospital Stay: Payer: Medicaid Other

## 2021-07-30 ENCOUNTER — Ambulatory Visit: Payer: Medicaid Other

## 2021-07-30 ENCOUNTER — Inpatient Hospital Stay: Payer: Medicaid Other | Attending: Oncology | Admitting: Oncology

## 2021-07-30 NOTE — Telephone Encounter (Signed)
Transition Care Management Unsuccessful Follow-up Telephone Call  Date of discharge and from where:  07/25/2021, [facility]  Attempts:  3rd Attempt  Reason for unsuccessful TCM follow-up call:  Unable to leave message - voicemail full # [phone].  Call also placed to # [phone], the phone just rings, no voicemail option.  Patient has appointment with Juluis Mire, NP 08/06/2021 @ [facility].

## 2021-07-30 NOTE — Telephone Encounter (Signed)
TC to Pt to inquire about missed appointment. Unable to leave message voice mailbox is full.

## 2021-07-31 ENCOUNTER — Ambulatory Visit: Payer: Medicaid Other

## 2021-07-31 ENCOUNTER — Telehealth: Payer: Self-pay | Admitting: *Deleted

## 2021-07-31 NOTE — Telephone Encounter (Signed)
Called the patient to check in as we have not seen him this week for his radiation treatments.  Unable to leave a voicemail as his box is full.  Attempted to call his sister, left her a voicemail requesting a call back with an update.    Gloriajean Dell. Leonie Green, BSN

## 2021-08-01 ENCOUNTER — Ambulatory Visit: Payer: Medicaid Other

## 2021-08-01 ENCOUNTER — Telehealth: Payer: Self-pay | Admitting: *Deleted

## 2021-08-01 NOTE — Telephone Encounter (Signed)
[facility] facility and was informed that patient has not been seen since Sunday. His sister came in today and did paperwork to sign him out of facility.  Called sister and left VM requesting call to get him back in so treatment can resume. Patient's voice mail was full. Also sent patient Mychart message.

## 2021-08-03 ENCOUNTER — Telehealth: Payer: Self-pay

## 2021-08-03 NOTE — Telephone Encounter (Signed)
TC to [facility] and [facility] SW that client signed himself out of assisted living [facility] on 07-26-21 and stated he wouldn't not be returning . Did not notify CN and transportation was arranged for his treatment the week of 07-28-2021--08-01-2021  Text was sent to client ,client never responded ,follow up with [facility] to find out client was no longer there hd checked out on 07-26-21  after D/C from hospital on 07-25-21. Will close out community follow up

## 2021-08-03 NOTE — Telephone Encounter (Signed)
Transportation arranged for the week and talked with [employer] and wellness and appt was scheduled for PCP.text to client to let him know of appointments and travel time.  Text to  client on 07-29-2021 to check on him no response   Text again 07-30-2021 no response

## 2021-08-03 NOTE — Telephone Encounter (Signed)
Tc to [employer] ,spoke with admissions intake coordinator [facility] ,she states client checked himself out of facility on Sunday  07-26-21 and stated he was not returning .States she tried to call nurse ,he is now getting his disability checks and received his back pay since April.,2022.  Nurse has been leaving messages all week no response. Will notify Cancer center and [facility] that we are not able to locate . Was informed he was in [place] ,[place]

## 2021-08-04 ENCOUNTER — Ambulatory Visit: Payer: Medicaid Other

## 2021-08-05 ENCOUNTER — Ambulatory Visit: Payer: Medicaid Other

## 2021-08-05 ENCOUNTER — Encounter: Payer: Self-pay | Admitting: *Deleted

## 2021-08-05 NOTE — Progress Notes (Signed)
[facility] Clinical Social Work  Clinical Social Work received a message from [employer] account planner.  Patient has not returned Ms. Martin's phone calls or messages.  He also missed his radiation appointments last week.  Per Ms. Hassell Done, [facility] (ALF) stated patient checked himself out and did not have plans to return.  Patient mentioned he was staying in [place], but has not been confirmed.   CSW shared above information with [facility] RN.  Johnnye Lana, MSW, LCSW, OSW-C Clinical Social Worker [facility] [phone]

## 2021-08-06 ENCOUNTER — Ambulatory Visit (INDEPENDENT_AMBULATORY_CARE_PROVIDER_SITE_OTHER): Payer: Medicaid Other | Admitting: Nurse Practitioner

## 2021-08-06 ENCOUNTER — Ambulatory Visit: Payer: Medicaid Other

## 2021-08-07 ENCOUNTER — Ambulatory Visit: Payer: Medicaid Other

## 2021-08-08 ENCOUNTER — Ambulatory Visit: Payer: Medicaid Other

## 2021-08-08 ENCOUNTER — Encounter (HOSPITAL_COMMUNITY): Payer: Medicaid Other

## 2021-08-11 ENCOUNTER — Ambulatory Visit: Payer: Medicaid Other

## 2021-08-11 ENCOUNTER — Encounter: Payer: Self-pay | Admitting: Radiation Oncology

## 2021-08-12 ENCOUNTER — Ambulatory Visit: Payer: Medicaid Other

## 2021-08-12 ENCOUNTER — Telehealth: Payer: Self-pay | Admitting: *Deleted

## 2021-08-12 NOTE — Telephone Encounter (Signed)
Attempted to reach patient again today. No answer or voice mail to leave message. Call would not go through.

## 2021-08-13 ENCOUNTER — Ambulatory Visit: Payer: Medicaid Other

## 2021-08-14 ENCOUNTER — Ambulatory Visit: Payer: Medicaid Other | Admitting: Radiation Oncology

## 2021-08-18 ENCOUNTER — Encounter: Payer: Self-pay | Admitting: Oncology

## 2021-08-18 NOTE — Progress Notes (Signed)
                                                                                                                                                             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 He had delays in starting treatment due to housing and transportation but he did not complete therapy or return calls for several weeks. Dr. Lisbeth Renshaw decided to discontinue his treatment since he was unavailable to reach. Dr. Benay Spice had similar issues with being unable to follow the patient.  Plan: The patient will receive a call in about one month from the radiation oncology department. He will hopefully continue follow up with Dr. Benay Spice as well.   ________________________________________________    Carola Rhine, [facility]

## 2021-09-28 ENCOUNTER — Emergency Department (HOSPITAL_COMMUNITY): Payer: Medicaid Other

## 2021-09-28 ENCOUNTER — Encounter (HOSPITAL_COMMUNITY): Payer: Self-pay

## 2021-09-28 ENCOUNTER — Inpatient Hospital Stay (HOSPITAL_COMMUNITY)
Admission: EM | Admit: 2021-09-28 | Discharge: 2021-10-28 | DRG: 870 | Disposition: E | Payer: Medicaid Other | Attending: Pulmonary Disease | Admitting: Pulmonary Disease

## 2021-09-28 ENCOUNTER — Inpatient Hospital Stay (HOSPITAL_COMMUNITY): Payer: Medicaid Other

## 2021-09-28 ENCOUNTER — Other Ambulatory Visit: Payer: Self-pay

## 2021-09-28 DIAGNOSIS — J9601 Acute respiratory failure with hypoxia: Secondary | ICD-10-CM | POA: Diagnosis present

## 2021-09-28 DIAGNOSIS — R609 Edema, unspecified: Secondary | ICD-10-CM | POA: Diagnosis not present

## 2021-09-28 DIAGNOSIS — Z85028 Personal history of other malignant neoplasm of stomach: Secondary | ICD-10-CM | POA: Diagnosis not present

## 2021-09-28 DIAGNOSIS — I130 Hypertensive heart and chronic kidney disease with heart failure and stage 1 through stage 4 chronic kidney disease, or unspecified chronic kidney disease: Secondary | ICD-10-CM | POA: Diagnosis present

## 2021-09-28 DIAGNOSIS — Z978 Presence of other specified devices: Secondary | ICD-10-CM

## 2021-09-28 DIAGNOSIS — I110 Hypertensive heart disease with heart failure: Secondary | ICD-10-CM | POA: Diagnosis not present

## 2021-09-28 DIAGNOSIS — A419 Sepsis, unspecified organism: Secondary | ICD-10-CM | POA: Diagnosis not present

## 2021-09-28 DIAGNOSIS — T405X4A Poisoning by cocaine, undetermined, initial encounter: Secondary | ICD-10-CM | POA: Diagnosis present

## 2021-09-28 DIAGNOSIS — I428 Other cardiomyopathies: Secondary | ICD-10-CM | POA: Diagnosis present

## 2021-09-28 DIAGNOSIS — E1122 Type 2 diabetes mellitus with diabetic chronic kidney disease: Secondary | ICD-10-CM | POA: Diagnosis present

## 2021-09-28 DIAGNOSIS — J811 Chronic pulmonary edema: Secondary | ICD-10-CM | POA: Diagnosis not present

## 2021-09-28 DIAGNOSIS — J440 Chronic obstructive pulmonary disease with acute lower respiratory infection: Secondary | ICD-10-CM | POA: Diagnosis present

## 2021-09-28 DIAGNOSIS — N1832 Chronic kidney disease, stage 3b: Secondary | ICD-10-CM | POA: Diagnosis present

## 2021-09-28 DIAGNOSIS — F17210 Nicotine dependence, cigarettes, uncomplicated: Secondary | ICD-10-CM | POA: Diagnosis present

## 2021-09-28 DIAGNOSIS — J9690 Respiratory failure, unspecified, unspecified whether with hypoxia or hypercapnia: Secondary | ICD-10-CM

## 2021-09-28 DIAGNOSIS — Z4659 Encounter for fitting and adjustment of other gastrointestinal appliance and device: Secondary | ICD-10-CM

## 2021-09-28 DIAGNOSIS — R64 Cachexia: Secondary | ICD-10-CM | POA: Diagnosis present

## 2021-09-28 DIAGNOSIS — Z66 Do not resuscitate: Secondary | ICD-10-CM | POA: Diagnosis present

## 2021-09-28 DIAGNOSIS — Z95828 Presence of other vascular implants and grafts: Secondary | ICD-10-CM | POA: Diagnosis not present

## 2021-09-28 DIAGNOSIS — Z9221 Personal history of antineoplastic chemotherapy: Secondary | ICD-10-CM

## 2021-09-28 DIAGNOSIS — J189 Pneumonia, unspecified organism: Secondary | ICD-10-CM | POA: Diagnosis not present

## 2021-09-28 DIAGNOSIS — E872 Acidosis, unspecified: Secondary | ICD-10-CM | POA: Diagnosis present

## 2021-09-28 DIAGNOSIS — R6521 Severe sepsis with septic shock: Secondary | ICD-10-CM | POA: Diagnosis not present

## 2021-09-28 DIAGNOSIS — D72819 Decreased white blood cell count, unspecified: Secondary | ICD-10-CM | POA: Diagnosis not present

## 2021-09-28 DIAGNOSIS — R570 Cardiogenic shock: Secondary | ICD-10-CM

## 2021-09-28 DIAGNOSIS — G928 Other toxic encephalopathy: Secondary | ICD-10-CM | POA: Diagnosis not present

## 2021-09-28 DIAGNOSIS — F1990 Other psychoactive substance use, unspecified, uncomplicated: Secondary | ICD-10-CM | POA: Diagnosis present

## 2021-09-28 DIAGNOSIS — R079 Chest pain, unspecified: Secondary | ICD-10-CM | POA: Diagnosis not present

## 2021-09-28 DIAGNOSIS — Z781 Physical restraint status: Secondary | ICD-10-CM

## 2021-09-28 DIAGNOSIS — Z452 Encounter for adjustment and management of vascular access device: Secondary | ICD-10-CM | POA: Diagnosis not present

## 2021-09-28 DIAGNOSIS — N183 Chronic kidney disease, stage 3 unspecified: Secondary | ICD-10-CM | POA: Diagnosis not present

## 2021-09-28 DIAGNOSIS — R188 Other ascites: Secondary | ICD-10-CM | POA: Diagnosis not present

## 2021-09-28 DIAGNOSIS — I2510 Atherosclerotic heart disease of native coronary artery without angina pectoris: Secondary | ICD-10-CM | POA: Diagnosis not present

## 2021-09-28 DIAGNOSIS — E43 Unspecified severe protein-calorie malnutrition: Secondary | ICD-10-CM | POA: Diagnosis present

## 2021-09-28 DIAGNOSIS — I5043 Acute on chronic combined systolic (congestive) and diastolic (congestive) heart failure: Secondary | ICD-10-CM | POA: Diagnosis not present

## 2021-09-28 DIAGNOSIS — R0602 Shortness of breath: Secondary | ICD-10-CM | POA: Diagnosis not present

## 2021-09-28 DIAGNOSIS — F1410 Cocaine abuse, uncomplicated: Secondary | ICD-10-CM | POA: Diagnosis present

## 2021-09-28 DIAGNOSIS — Z955 Presence of coronary angioplasty implant and graft: Secondary | ICD-10-CM

## 2021-09-28 DIAGNOSIS — E875 Hyperkalemia: Secondary | ICD-10-CM | POA: Diagnosis not present

## 2021-09-28 DIAGNOSIS — I471 Supraventricular tachycardia: Secondary | ICD-10-CM | POA: Diagnosis present

## 2021-09-28 DIAGNOSIS — R451 Restlessness and agitation: Secondary | ICD-10-CM | POA: Diagnosis not present

## 2021-09-28 DIAGNOSIS — R579 Shock, unspecified: Secondary | ICD-10-CM

## 2021-09-28 DIAGNOSIS — Z6824 Body mass index (BMI) 24.0-24.9, adult: Secondary | ICD-10-CM

## 2021-09-28 DIAGNOSIS — N189 Chronic kidney disease, unspecified: Secondary | ICD-10-CM | POA: Diagnosis not present

## 2021-09-28 DIAGNOSIS — T68XXXA Hypothermia, initial encounter: Secondary | ICD-10-CM | POA: Diagnosis present

## 2021-09-28 DIAGNOSIS — N179 Acute kidney failure, unspecified: Secondary | ICD-10-CM

## 2021-09-28 DIAGNOSIS — E870 Hyperosmolality and hypernatremia: Secondary | ICD-10-CM | POA: Diagnosis not present

## 2021-09-28 DIAGNOSIS — E11649 Type 2 diabetes mellitus with hypoglycemia without coma: Secondary | ICD-10-CM | POA: Diagnosis present

## 2021-09-28 DIAGNOSIS — Z8249 Family history of ischemic heart disease and other diseases of the circulatory system: Secondary | ICD-10-CM

## 2021-09-28 DIAGNOSIS — R509 Fever, unspecified: Secondary | ICD-10-CM | POA: Diagnosis not present

## 2021-09-28 DIAGNOSIS — J9600 Acute respiratory failure, unspecified whether with hypoxia or hypercapnia: Secondary | ICD-10-CM | POA: Diagnosis not present

## 2021-09-28 DIAGNOSIS — Z20822 Contact with and (suspected) exposure to covid-19: Secondary | ICD-10-CM | POA: Diagnosis present

## 2021-09-28 DIAGNOSIS — I252 Old myocardial infarction: Secondary | ICD-10-CM

## 2021-09-28 DIAGNOSIS — K559 Vascular disorder of intestine, unspecified: Secondary | ICD-10-CM | POA: Diagnosis present

## 2021-09-28 DIAGNOSIS — Z01818 Encounter for other preprocedural examination: Secondary | ICD-10-CM

## 2021-09-28 DIAGNOSIS — R34 Anuria and oliguria: Secondary | ICD-10-CM | POA: Diagnosis present

## 2021-09-28 DIAGNOSIS — Z91199 Patient's noncompliance with other medical treatment and regimen due to unspecified reason: Secondary | ICD-10-CM

## 2021-09-28 DIAGNOSIS — S022XXA Fracture of nasal bones, initial encounter for closed fracture: Secondary | ICD-10-CM | POA: Diagnosis not present

## 2021-09-28 DIAGNOSIS — Z79899 Other long term (current) drug therapy: Secondary | ICD-10-CM

## 2021-09-28 DIAGNOSIS — Z7982 Long term (current) use of aspirin: Secondary | ICD-10-CM

## 2021-09-28 DIAGNOSIS — E1165 Type 2 diabetes mellitus with hyperglycemia: Secondary | ICD-10-CM | POA: Diagnosis not present

## 2021-09-28 DIAGNOSIS — J810 Acute pulmonary edema: Secondary | ICD-10-CM | POA: Diagnosis not present

## 2021-09-28 DIAGNOSIS — K753 Granulomatous hepatitis, not elsewhere classified: Secondary | ICD-10-CM | POA: Diagnosis not present

## 2021-09-28 DIAGNOSIS — J9811 Atelectasis: Secondary | ICD-10-CM | POA: Diagnosis not present

## 2021-09-28 DIAGNOSIS — D649 Anemia, unspecified: Secondary | ICD-10-CM | POA: Diagnosis not present

## 2021-09-28 DIAGNOSIS — K219 Gastro-esophageal reflux disease without esophagitis: Secondary | ICD-10-CM | POA: Diagnosis present

## 2021-09-28 DIAGNOSIS — R4182 Altered mental status, unspecified: Secondary | ICD-10-CM | POA: Diagnosis not present

## 2021-09-28 DIAGNOSIS — D65 Disseminated intravascular coagulation [defibrination syndrome]: Secondary | ICD-10-CM | POA: Diagnosis present

## 2021-09-28 DIAGNOSIS — Z7984 Long term (current) use of oral hypoglycemic drugs: Secondary | ICD-10-CM

## 2021-09-28 DIAGNOSIS — I509 Heart failure, unspecified: Secondary | ICD-10-CM

## 2021-09-28 DIAGNOSIS — R578 Other shock: Secondary | ICD-10-CM | POA: Diagnosis present

## 2021-09-28 DIAGNOSIS — R410 Disorientation, unspecified: Secondary | ICD-10-CM | POA: Diagnosis not present

## 2021-09-28 DIAGNOSIS — Z4682 Encounter for fitting and adjustment of non-vascular catheter: Secondary | ICD-10-CM | POA: Diagnosis not present

## 2021-09-28 DIAGNOSIS — I517 Cardiomegaly: Secondary | ICD-10-CM | POA: Diagnosis not present

## 2021-09-28 DIAGNOSIS — D6489 Other specified anemias: Secondary | ICD-10-CM | POA: Diagnosis present

## 2021-09-28 DIAGNOSIS — F1090 Alcohol use, unspecified, uncomplicated: Secondary | ICD-10-CM | POA: Diagnosis present

## 2021-09-28 DIAGNOSIS — G9341 Metabolic encephalopathy: Secondary | ICD-10-CM | POA: Diagnosis not present

## 2021-09-28 DIAGNOSIS — Z862 Personal history of diseases of the blood and blood-forming organs and certain disorders involving the immune mechanism: Secondary | ICD-10-CM

## 2021-09-28 DIAGNOSIS — J90 Pleural effusion, not elsewhere classified: Secondary | ICD-10-CM | POA: Diagnosis not present

## 2021-09-28 LAB — CBC WITH DIFFERENTIAL/PLATELET
Abs Immature Granulocytes: 0.22 10*3/uL — ABNORMAL HIGH (ref 0.00–0.07)
Basophils Absolute: 0.1 10*3/uL (ref 0.0–0.1)
Basophils Relative: 1 %
Eosinophils Absolute: 0 10*3/uL (ref 0.0–0.5)
Eosinophils Relative: 0 %
HCT: 45.2 % (ref 39.0–52.0)
Hemoglobin: 14 g/dL (ref 13.0–17.0)
Immature Granulocytes: 2 %
Lymphocytes Relative: 0 %
Lymphs Abs: 0 10*3/uL — ABNORMAL LOW (ref 0.7–4.0)
MCH: 30.4 pg (ref 26.0–34.0)
MCHC: 31 g/dL (ref 30.0–36.0)
MCV: 98 fL (ref 80.0–100.0)
Monocytes Absolute: 0.5 10*3/uL (ref 0.1–1.0)
Monocytes Relative: 4 %
Neutro Abs: 10.1 10*3/uL — ABNORMAL HIGH (ref 1.7–7.7)
Neutrophils Relative %: 93 %
Platelets: 64 10*3/uL — ABNORMAL LOW (ref 150–400)
RBC: 4.61 MIL/uL (ref 4.22–5.81)
RDW: 23.4 % — ABNORMAL HIGH (ref 11.5–15.5)
WBC: 10.9 10*3/uL — ABNORMAL HIGH (ref 4.0–10.5)
nRBC: 3 % — ABNORMAL HIGH (ref 0.0–0.2)

## 2021-09-28 LAB — COMPREHENSIVE METABOLIC PANEL
ALT: 80 U/L — ABNORMAL HIGH (ref 0–44)
AST: 116 U/L — ABNORMAL HIGH (ref 15–41)
Albumin: 3.3 g/dL — ABNORMAL LOW (ref 3.5–5.0)
Alkaline Phosphatase: 161 U/L — ABNORMAL HIGH (ref 38–126)
Anion gap: 32 — ABNORMAL HIGH (ref 5–15)
BUN: 113 mg/dL — ABNORMAL HIGH (ref 6–20)
CO2: 8 mmol/L — ABNORMAL LOW (ref 22–32)
Calcium: 10.4 mg/dL — ABNORMAL HIGH (ref 8.9–10.3)
Chloride: 104 mmol/L (ref 98–111)
Creatinine, Ser: 4.02 mg/dL — ABNORMAL HIGH (ref 0.61–1.24)
GFR, Estimated: 17 mL/min — ABNORMAL LOW (ref >60)
Glucose, Bld: 51 mg/dL — ABNORMAL LOW (ref 70–99)
Potassium: 6.4 mmol/L (ref 3.5–5.1)
Sodium: 144 mmol/L (ref 135–145)
Total Bilirubin: 7 mg/dL — ABNORMAL HIGH (ref 0.3–1.2)
Total Protein: 7.4 g/dL (ref 6.5–8.1)

## 2021-09-28 LAB — URINALYSIS, ROUTINE W REFLEX MICROSCOPIC
Bilirubin Urine: NEGATIVE
Glucose, UA: NEGATIVE mg/dL
Ketones, ur: NEGATIVE mg/dL
Leukocytes,Ua: NEGATIVE
Nitrite: NEGATIVE
Protein, ur: 100 mg/dL — AB
Specific Gravity, Urine: 1.015 (ref 1.005–1.030)
pH: 5 (ref 5.0–8.0)

## 2021-09-28 LAB — RAPID URINE DRUG SCREEN, HOSP PERFORMED
Amphetamines: NOT DETECTED
Barbiturates: NOT DETECTED
Benzodiazepines: NOT DETECTED
Cocaine: POSITIVE — AB
Opiates: NOT DETECTED
Tetrahydrocannabinol: NOT DETECTED

## 2021-09-28 LAB — BLOOD GAS, VENOUS
Acid-base deficit: 13.1 mmol/L — ABNORMAL HIGH (ref 0.0–2.0)
Bicarbonate: 12.2 mmol/L — ABNORMAL LOW (ref 20.0–28.0)
O2 Saturation: 69.3 %
Patient temperature: 98.6
pCO2, Ven: 27 mmHg — ABNORMAL LOW (ref 44.0–60.0)
pH, Ven: 7.275 (ref 7.250–7.430)
pO2, Ven: 49.5 mmHg — ABNORMAL HIGH (ref 32.0–45.0)

## 2021-09-28 LAB — BLOOD GAS, ARTERIAL
Acid-base deficit: 17.3 mmol/L — ABNORMAL HIGH (ref 0.0–2.0)
Bicarbonate: 11.4 mmol/L — ABNORMAL LOW (ref 20.0–28.0)
FIO2: 100
MECHVT: 450 mL
O2 Saturation: 98.9 %
Patient temperature: 98.6
RATE: 20 resp/min
pCO2 arterial: 36.4 mmHg (ref 32.0–48.0)
pH, Arterial: 7.123 — CL (ref 7.350–7.450)
pO2, Arterial: 300 mmHg — ABNORMAL HIGH (ref 83.0–108.0)

## 2021-09-28 LAB — I-STAT CHEM 8, ED
BUN: 111 mg/dL — ABNORMAL HIGH (ref 6–20)
Calcium, Ion: 0.98 mmol/L — ABNORMAL LOW (ref 1.15–1.40)
Chloride: 111 mmol/L (ref 98–111)
Creatinine, Ser: 4 mg/dL — ABNORMAL HIGH (ref 0.61–1.24)
Glucose, Bld: 71 mg/dL (ref 70–99)
HCT: 43 % (ref 39.0–52.0)
Hemoglobin: 14.6 g/dL (ref 13.0–17.0)
Potassium: 5.9 mmol/L — ABNORMAL HIGH (ref 3.5–5.1)
Sodium: 136 mmol/L (ref 135–145)
TCO2: 13 mmol/L — ABNORMAL LOW (ref 22–32)

## 2021-09-28 LAB — LACTIC ACID, PLASMA: Lactic Acid, Venous: >9 mmol/L (ref 0.5–1.9)

## 2021-09-28 LAB — CK: Total CK: 389 U/L (ref 49–397)

## 2021-09-28 LAB — ETHANOL: Alcohol, Ethyl (B): <10 mg/dL (ref <10)

## 2021-09-28 LAB — RESP PANEL BY RT-PCR (FLU A&B, COVID) ARPGX2
Influenza A by PCR: NEGATIVE
Influenza B by PCR: NEGATIVE
SARS Coronavirus 2 by RT PCR: NEGATIVE

## 2021-09-28 LAB — CBG MONITORING, ED: Glucose-Capillary: 136 mg/dL — ABNORMAL HIGH (ref 70–99)

## 2021-09-28 LAB — PROTIME-INR
INR: 3.3 — ABNORMAL HIGH (ref 0.8–1.2)
Prothrombin Time: 33.2 seconds — ABNORMAL HIGH (ref 11.4–15.2)

## 2021-09-28 LAB — BRAIN NATRIURETIC PEPTIDE: B Natriuretic Peptide: 4312.7 pg/mL — ABNORMAL HIGH (ref 0.0–100.0)

## 2021-09-28 LAB — APTT: aPTT: 38 seconds — ABNORMAL HIGH (ref 24–36)

## 2021-09-28 LAB — TROPONIN I (HIGH SENSITIVITY)
Troponin I (High Sensitivity): 426 ng/L (ref <18)
Troponin I (High Sensitivity): 453 ng/L (ref <18)

## 2021-09-28 MED ORDER — DOBUTAMINE IN D5W 4-5 MG/ML-% IV SOLN
5.0000 ug/kg/min | INTRAVENOUS | Status: DC
  Administered 2021-09-28: 5 ug/kg/min via INTRAVENOUS
  Administered 2021-09-30: 7.5 ug/kg/min via INTRAVENOUS
  Filled 2021-09-28 (×2): qty 250

## 2021-09-28 MED ORDER — CALCIUM GLUCONATE 10 % IV SOLN
1.0000 g | Freq: Once | INTRAVENOUS | Status: DC
  Filled 2021-09-28: qty 10

## 2021-09-28 MED ORDER — NOREPINEPHRINE 4 MG/250ML-% IV SOLN
0.0000 ug/min | INTRAVENOUS | Status: DC
  Administered 2021-09-28: 2 ug/min via INTRAVENOUS
  Filled 2021-09-28: qty 250

## 2021-09-28 MED ORDER — LACTATED RINGERS IV BOLUS
500.0000 mL | Freq: Once | INTRAVENOUS | Status: AC
  Administered 2021-09-28: 500 mL via INTRAVENOUS

## 2021-09-28 MED ORDER — DOCUSATE SODIUM 100 MG PO CAPS: 100.0000 mg | ORAL_CAPSULE | Freq: Two times a day (BID) | ORAL | Status: DC | PRN

## 2021-09-28 MED ORDER — FUROSEMIDE 10 MG/ML IJ SOLN
80.0000 mg | Freq: Once | INTRAMUSCULAR | Status: AC
  Administered 2021-09-28: 80 mg via INTRAVENOUS
  Filled 2021-09-28: qty 8

## 2021-09-28 MED ORDER — DEXTROSE 50 % IV SOLN: 50.0000 mL | Freq: Once | INTRAVENOUS | Status: DC

## 2021-09-28 MED ORDER — SODIUM CHLORIDE 0.9 % IV SOLN: 1.0000 g | Freq: Two times a day (BID) | INTRAVENOUS | Status: DC

## 2021-09-28 MED ORDER — SODIUM CHLORIDE 0.9 % IV SOLN
2.0000 g | INTRAVENOUS | Status: DC
  Administered 2021-09-29: 2 g via INTRAVENOUS
  Filled 2021-09-28: qty 2

## 2021-09-28 MED ORDER — SODIUM BICARBONATE 8.4 % IV SOLN
INTRAVENOUS | Status: DC
  Filled 2021-09-28: qty 150

## 2021-09-28 MED ORDER — VANCOMYCIN HCL 1500 MG/300ML IV SOLN
1500.0000 mg | INTRAVENOUS | Status: AC
  Administered 2021-09-29: 1500 mg via INTRAVENOUS
  Filled 2021-09-28: qty 300

## 2021-09-28 MED ORDER — ETOMIDATE 2 MG/ML IV SOLN: 20.0000 mg | Freq: Once | INTRAVENOUS | Status: DC

## 2021-09-28 MED ORDER — INSULIN ASPART 100 UNIT/ML IV SOLN
5.0000 [IU] | Freq: Once | INTRAVENOUS | Status: DC
  Filled 2021-09-28: qty 0.05

## 2021-09-28 MED ORDER — SODIUM BICARBONATE 8.4 % IV SOLN: 150.0000 meq | Freq: Once | INTRAVENOUS | Status: DC

## 2021-09-28 MED ORDER — SODIUM CHLORIDE 0.9 % IV SOLN
250.0000 mL | INTRAVENOUS | Status: DC
  Administered 2021-09-29 – 2021-10-08 (×3): 250 mL via INTRAVENOUS

## 2021-09-28 MED ORDER — SODIUM CHLORIDE 0.9% FLUSH
10.0000 mL | Freq: Two times a day (BID) | INTRAVENOUS | Status: DC
Start: 2021-09-28 — End: 2021-10-10
  Administered 2021-09-29 – 2021-10-02 (×8): 10 mL
  Administered 2021-10-02: 40 mL
  Administered 2021-10-03 – 2021-10-10 (×15): 10 mL

## 2021-09-28 MED ORDER — POLYETHYLENE GLYCOL 3350 17 G PO PACK: 17.0000 g | PACK | Freq: Every day | ORAL | Status: DC | PRN

## 2021-09-28 MED ORDER — SODIUM CHLORIDE 0.9 % IV SOLN
1.0000 g | Freq: Once | INTRAVENOUS | Status: AC
  Administered 2021-09-28: 1 g via INTRAVENOUS
  Filled 2021-09-28: qty 10

## 2021-09-28 MED ORDER — KETAMINE HCL 50 MG/5ML IJ SOSY
PREFILLED_SYRINGE | INTRAMUSCULAR | Status: AC
  Filled 2021-09-28: qty 5

## 2021-09-28 MED ORDER — CHLORHEXIDINE GLUCONATE CLOTH 2 % EX PADS
6.0000 | MEDICATED_PAD | Freq: Every day | CUTANEOUS | Status: DC
  Administered 2021-09-29 – 2021-10-09 (×11): 6 via TOPICAL
  Filled 2021-09-28: qty 6

## 2021-09-28 MED ORDER — NOREPINEPHRINE 4 MG/250ML-% IV SOLN
5.0000 ug/min | INTRAVENOUS | Status: DC
  Administered 2021-09-29: 40 ug/min via INTRAVENOUS
  Administered 2021-09-29: 41 ug/min via INTRAVENOUS
  Administered 2021-09-29 (×2): 40 ug/min via INTRAVENOUS
  Administered 2021-09-29: 39 ug/min via INTRAVENOUS
  Filled 2021-09-28 (×6): qty 250

## 2021-09-28 MED ORDER — CALCIUM GLUCONATE 10 % IV SOLN
1.0000 g | Freq: Once | INTRAVENOUS | Status: AC
  Administered 2021-09-28: 1 g via INTRAVENOUS
  Filled 2021-09-28: qty 10

## 2021-09-28 MED ORDER — DOBUTAMINE IN D5W 4-5 MG/ML-% IV SOLN
2.5000 ug/kg/min | INTRAVENOUS | Status: DC
  Administered 2021-09-28: 2.505 ug/kg/min via INTRAVENOUS
  Filled 2021-09-28: qty 250

## 2021-09-28 MED ORDER — FENTANYL CITRATE PF 50 MCG/ML IJ SOSY: 100.0000 ug | PREFILLED_SYRINGE | INTRAMUSCULAR | Status: DC | PRN

## 2021-09-28 MED ORDER — DEXTROSE 10 % IV SOLN: Freq: Once | INTRAVENOUS | Status: DC

## 2021-09-28 MED ORDER — PHENYLEPHRINE 40 MCG/ML (10ML) SYRINGE FOR IV PUSH (FOR BLOOD PRESSURE SUPPORT)
100.0000 ug | PREFILLED_SYRINGE | Freq: Once | INTRAVENOUS | Status: DC
  Administered 2021-09-28: 120 ug via INTRAVENOUS

## 2021-09-28 MED ORDER — ETOMIDATE 2 MG/ML IV SOLN
10.0000 mg | Freq: Once | INTRAVENOUS | Status: DC
  Administered 2021-09-28: 20 mg via INTRAVENOUS
  Filled 2021-09-28: qty 10

## 2021-09-28 MED ORDER — SODIUM BICARBONATE 8.4 % IV SOLN
INTRAVENOUS | Status: AC
  Filled 2021-09-28: qty 150

## 2021-09-28 MED ORDER — ROCURONIUM BROMIDE 10 MG/ML (PF) SYRINGE
PREFILLED_SYRINGE | INTRAVENOUS | Status: AC
  Administered 2021-09-28: 100 mg via INTRAVENOUS
  Filled 2021-09-28: qty 10

## 2021-09-28 MED ORDER — LACTATED RINGERS IV BOLUS (SEPSIS): 1000.0000 mL | Freq: Once | INTRAVENOUS | Status: DC

## 2021-09-28 MED ORDER — ETOMIDATE 2 MG/ML IV SOLN
INTRAVENOUS | Status: AC
  Filled 2021-09-28: qty 10

## 2021-09-28 MED ORDER — PHENYLEPHRINE HCL (PRESSORS) 10 MG/ML IV SOLN
0.0000 ug/min | INTRAVENOUS | Status: DC
  Filled 2021-09-28 (×3): qty 2

## 2021-09-28 MED ORDER — NOREPINEPHRINE 4 MG/250ML-% IV SOLN
INTRAVENOUS | Status: AC
  Filled 2021-09-28: qty 250

## 2021-09-28 MED ORDER — DOCUSATE SODIUM 50 MG/5ML PO LIQD
100.0000 mg | Freq: Two times a day (BID) | ORAL | Status: DC | PRN
  Filled 2021-09-28: qty 10

## 2021-09-28 MED ORDER — DEXTROSE 50 % IV SOLN
1.0000 | Freq: Once | INTRAVENOUS | Status: AC
  Administered 2021-09-28: 50 mL via INTRAVENOUS
  Filled 2021-09-28: qty 50

## 2021-09-28 MED ORDER — SODIUM ZIRCONIUM CYCLOSILICATE 10 G PO PACK
10.0000 g | PACK | Freq: Three times a day (TID) | ORAL | Status: DC
  Administered 2021-09-29 (×2): 10 g
  Filled 2021-09-28 (×2): qty 1

## 2021-09-28 MED ORDER — INSULIN ASPART 100 UNIT/ML IV SOLN
10.0000 [IU] | Freq: Once | INTRAVENOUS | Status: DC
  Filled 2021-09-28: qty 0.1

## 2021-09-28 MED ORDER — SODIUM CHLORIDE 0.9% FLUSH
10.0000 mL | INTRAVENOUS | Status: DC | PRN
  Administered 2021-10-03: 20 mL

## 2021-09-28 MED ORDER — SODIUM CHLORIDE 0.9 % IV SOLN
500.0000 mg | Freq: Once | INTRAVENOUS | Status: AC
  Administered 2021-09-28: 500 mg via INTRAVENOUS
  Filled 2021-09-28 (×2): qty 500

## 2021-09-28 MED ORDER — FENTANYL CITRATE PF 50 MCG/ML IJ SOSY
100.0000 ug | PREFILLED_SYRINGE | INTRAMUSCULAR | Status: DC | PRN
Start: 2021-09-28 — End: 2021-09-29

## 2021-09-28 MED ORDER — SODIUM CHLORIDE 0.9% FLUSH: 3.0000 mL | INTRAVENOUS | Status: DC | PRN

## 2021-09-28 MED ORDER — VANCOMYCIN VARIABLE DOSE PER UNSTABLE RENAL FUNCTION (PHARMACIST DOSING): Status: DC

## 2021-09-28 MED ORDER — LACTATED RINGERS IV SOLN: INTRAVENOUS | Status: DC

## 2021-09-28 MED ORDER — ROCURONIUM BROMIDE 50 MG/5ML IV SOLN
100.0000 mg | Freq: Once | INTRAVENOUS | Status: AC
  Filled 2021-09-28: qty 10

## 2021-09-28 MED ORDER — SODIUM ZIRCONIUM CYCLOSILICATE 10 G PO PACK: 10.0000 g | PACK | Freq: Two times a day (BID) | ORAL | Status: DC

## 2021-09-28 MED ORDER — SODIUM CHLORIDE 0.9% FLUSH: 3.0000 mL | Freq: Two times a day (BID) | INTRAVENOUS | Status: DC

## 2021-09-28 NOTE — Progress Notes (Signed)
Pharmacy Antibiotic Note  Tyler Wong is a 55 y.o. male admitted on 09/27/2021 with sepsis.  Pharmacy has been consulted for Vancomycin dosing.  SCr = 4 (10/2) SCr = 1.79 last admission (07/25/21)  Plan: Vancomycin 1500mg  IV x 1 dose Due to elevated SCr on this admission, will follow renal function trend and order future doses based off of levels or scheduled once SCr stabilizes Cefepime adjusted to 2gm IV q24h based on current renal function  Height: 5\' 11"  (180.3 cm) Weight: 70.8 kg (156 lb 1.4 oz) IBW/kg (Calculated) : 75.3  Temp (24hrs), Avg:96.2 F (35.7 C), Min:95.9 F (35.5 C), Max:96.3 F (35.7 C)  Recent Labs  Lab 10/13/2021 1853 10/12/2021 1955 10/17/2021 2203  WBC 10.9*  --   --   CREATININE 4.02* 4.00*  --   LATICACIDVEN  --   --  >9.0*    Estimated Creatinine Clearance: 20.9 mL/min (A) (by C-G formula based on SCr of 4 mg/dL (H)).    No Known Allergies  Antimicrobials this admission: 10/2 Ceftriaxone x 1 10/2 Azithromycin x 1 10/2 Vancomycin >> 10/2 Cefepime >>  Dose adjustments this admission:   Microbiology results: 10/2  BCx:   10/2 UCx:     Thank you for allowing pharmacy to be a part of this patient's care.  Everette Rank, PharmD 10/08/2021 10:57 PM

## 2021-09-28 NOTE — ED Notes (Signed)
[place] MD notified of BP 77/51

## 2021-09-28 NOTE — ED Notes (Signed)
Notified primary RN of critical lactic acid >9 per lab, rechecked for accuracy.

## 2021-09-28 NOTE — ED Notes (Signed)
MD notified that pt MAP is under 65. MD ordered initiation of Levophed.

## 2021-09-28 NOTE — ED Notes (Signed)
Tube successfully placed by MD

## 2021-09-28 NOTE — Consult Note (Addendum)
[facility] Nephrology Consultation Note  Requesting MD: Dr Regan Lemming ([place]) Reason for consult: AKI and hyperkalemia.   HPI:  Tyler Wong is a 55 y.o. 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  Apparently patient is from motel was sitting outside for last few hours in the rain.  He was uncooperative with EMS.  His lungs were clear.  On arrival to the ER, he was hypothermic to 96.3, hypotensive with blood pressure 69/55.  The labs showed VBG 7.27, potassium level 6.4, CO2 8, BUN 113, creatinine 4.02, hemoglobin 14.  The baseline serum creatinine level fluctuating between 1.2-1.8.  It seems like he is on torsemide 80 mg twice a day, metolazone weekly and Jardiance at home. He received around 500 cc of LR and then received Lasix 80 mg IV.  He was started on Levophed for shock.  He is being treated for hyperkalemia with Lasix, insulin, dextrose, calcium gluconate.  The chest x-ray with stable cardiomegaly without any active lung disease.  He was empirically treated with azithromycin and ceftriaxone for possible pneumonia. He is currently alert awake but agitated.  He is on heating blanket and reports generally not feeling well.  He answers yes for dialysis if needed.  Urine toxicology and rest of the lab results are pending.  COVID and influenza test is negative.  He is being admitted to ICU for further management.  Creatinine  Date/Time Value Ref Range Status  07/17/2021 10:25 AM 1.22 0.61 - 1.24 mg/dL Final  07/03/2021 10:45 AM 1.28 (H) 0.61 - 1.24 mg/dL Final  06/16/2021 11:22 AM 2.87 (H) 0.61 - 1.24 mg/dL Final  03/19/2021 10:50 AM 1.21 0.61 - 1.24 mg/dL  Final  03/06/2021 11:46 AM 1.34 (H) 0.61 - 1.24 mg/dL Final  02/19/2021 10:28 AM 1.48 (H) 0.61 - 1.24 mg/dL Final  02/05/2021 09:25 AM 1.37 (H) 0.61 - 1.24 mg/dL Final  01/22/2021 09:05 AM 1.66 (H) 0.61 - 1.24 mg/dL Final  01/08/2021 08:28 AM 1.29 (H) 0.61 - 1.24 mg/dL Final  12/26/2020 10:49 AM 1.50 (H) 0.61 - 1.24 mg/dL Final  12/12/2020 09:52 AM 1.46 (H) 0.61 - 1.24 mg/dL Final   Creatinine, Ser  Date/Time Value Ref Range Status  10/08/2021 07:55 PM 4.00 (H) 0.61 - 1.24 mg/dL Final  10/04/2021 06:53 PM 4.02 (H) 0.61 - 1.24 mg/dL Final  07/25/2021 07:22 AM 1.79 (H) 0.61 - 1.24 mg/dL Final  07/24/2021 04:25 AM 1.42 (H) 0.61 - 1.24 mg/dL Final  07/23/2021 03:25 AM 1.88 (H) 0.61 - 1.24 mg/dL Final    Comment:    DELTA CHECK NOTED  07/22/2021 02:49 PM 2.09 (H) 0.61 - 1.24 mg/dL Final  06/27/2021 09:16 AM 1.92 (H) 0.61 - 1.24 mg/dL Final  06/23/2021 02:07 AM 1.72 (H) 0.61 - 1.24 mg/dL Final  06/22/2021 07:33 AM 1.87 (H) 0.61 - 1.24 mg/dL Final  06/21/2021 01:52 AM 2.44 (H) 0.61 - 1.24 mg/dL Final  06/20/2021 04:26 AM 2.82 (H) 0.61 - 1.24 mg/dL Final  06/19/2021 01:40 AM 3.42 (H) 0.61 - 1.24 mg/dL Final  06/18/2021 01:00 AM 2.70 (H) 0.61 - 1.24 mg/dL Final  06/17/2021 06:36 PM 2.92 (H) 0.61 - 1.24 mg/dL Final  06/11/2021 12:16 PM 2.19 (H)  0.61 - 1.24 mg/dL Final  05/21/2021 04:55 AM 1.66 (H) 0.61 - 1.24 mg/dL Final  05/20/2021 06:50 PM 2.23 (H) 0.61 - 1.24 mg/dL Final  05/20/2021 05:18 AM 2.71 (H) 0.61 - 1.24 mg/dL Final  05/19/2021 05:35 AM 1.94 (H) 0.61 - 1.24 mg/dL Final   PMHx:   Past Medical History:  Diagnosis Date   CAD in native artery 07/17/2014   STEMI- LAD stenosis with DES   CHF (congestive heart failure) (HCC)    Cocaine abuse (HCC)    COPD (chronic obstructive pulmonary disease) (HCC)    Diabetes mellitus without complication ([place])    Dyspnea    Gastric cancer ([place]) 11/01/2020   GERD (gastroesophageal reflux disease)    Hypertension    ST elevation myocardial  infarction (STEMI) involving left anterior descending (LAD) coronary artery with complication ([place])    Tobacco abuse 07/17/2014    Past Surgical History:  Procedure Laterality Date   BIOPSY  11/05/2020   Procedure: BIOPSY;  Surgeon: Wilford Corner, MD;  Location: [place];  Service: Endoscopy;;   BIOPSY  06/20/2021   Procedure: BIOPSY;  Surgeon: Ronald Lobo, MD;  Location: [place];  Service: Endoscopy;;   CORONARY ANGIOPLASTY WITH STENT PLACEMENT  07/18/14   resolute DES to LAD STEMI   ESOPHAGOGASTRODUODENOSCOPY N/A 11/05/2020   Procedure: ESOPHAGOGASTRODUODENOSCOPY (EGD);  Surgeon: Wilford Corner, MD;  Location: [place];  Service: Endoscopy;  Laterality: N/A;   ESOPHAGOGASTRODUODENOSCOPY N/A 06/20/2021   Procedure: ESOPHAGOGASTRODUODENOSCOPY (EGD);  Surgeon: Ronald Lobo, MD;  Location: [facility] ENDOSCOPY;  Service: Endoscopy;  Laterality: N/A;   IR IMAGING GUIDED PORT INSERTION  12/06/2020   LEFT HEART CATH N/A 07/17/2014   Procedure: LEFT HEART CATH;  Surgeon: [name], MD;  Location: [facility] CATH LAB;  Service: Cardiovascular;  Laterality: N/A;   PERCUTANEOUS CORONARY STENT INTERVENTION (PCI-S)  07/17/2014   Procedure: PERCUTANEOUS CORONARY STENT INTERVENTION (PCI-S);  Surgeon: [name], MD;  Location: [facility] CATH LAB;  Service: Cardiovascular;;   RIGHT/LEFT HEART CATH AND CORONARY ANGIOGRAPHY N/A 10/03/2018   Procedure: RIGHT/LEFT HEART CATH AND CORONARY ANGIOGRAPHY;  Surgeon: [name], MD;  Location: [place] CV LAB;  Service: Cardiovascular;  Laterality: N/A;    Family Hx:  Family History  Problem Relation Age of Onset   Cirrhosis Father    Heart attack Mother    Heart attack Sister    Heart failure Sister    Heart attack Brother     Social History:  reports that he has been smoking cigarettes. He has a 7.50 pack-year smoking history. He has never used smokeless tobacco. He reports current alcohol use. He reports current drug use. Drugs: Cocaine  and "Crack" cocaine.  Allergies: No Known Allergies  Medications: Prior to Admission medications   Medication Sig Start Date End Date Taking? Authorizing Provider  ASPIRIN LOW DOSE 81 MG EC tablet TAKE 1 TABLET (81 MG TOTAL) BY MOUTH DAILY (MORNING) 03/05/21   Bensimhon, Shaune Pascal, MD  atorvastatin (LIPITOR) 80 MG tablet Take 1 tablet (80 mg total) by mouth daily. 11/26/20   [name], MD  famotidine (PEPCID) 20 MG tablet Take 1 tablet (20 mg total) by mouth daily. 07/26/21   Rai, Vernelle Emerald, MD  gabapentin (NEURONTIN) 100 MG capsule Take 1 capsule (100 mg total) by mouth 2 (two) times daily. 05/21/21   Nita Sells, MD  JARDIANCE 10 MG TABS tablet TAKE 1 TABLET (10 MG TOTAL) BY MOUTH DAILY BEFORE BREAKFAST. 07/29/21   [name], MD  metoCLOPramide (REGLAN) 5 MG tablet Take 1 tablet (5 mg total) by mouth every 8 (eight) hours as needed for nausea or vomiting. 07/25/21 07/25/22  Rai, Vernelle Emerald, MD  metolazone (ZAROXOLYN) 2.5 MG tablet Take 1 tablet (2.5 mg total) by mouth once a week. On every Monday morning.  Take 30 minutes prior to taking AM dose of torsemide. 07/25/21   Rai, Vernelle Emerald, MD  Multiple Vitamins-Minerals (MULTIVITAMIN WITH MINERALS) tablet Take 1 tablet by mouth daily.    [provider]  nicotine (NICODERM CQ - DOSED IN MG/24 HR) 7 mg/24hr patch Place 1 patch (7 mg total) onto the skin daily. 05/22/21   Nita Sells, MD  pantoprazole (PROTONIX) 40 MG tablet TAKE 1 TABLET (40 MG TOTAL) BY MOUTH 2 (TWO) TIMES DAILY. 07/29/21 08/28/21  Ladell Pier, MD  polyethylene glycol (MIRALAX) 17 g packet Take 17 g by mouth daily as needed. 07/17/21   Owens Shark, NP  potassium chloride SA (KLOR-CON) 20 MEQ tablet Take 2 tablets (40 mEq total) by mouth daily. 01/20/21   [name], MD  PROAIR HFA 108 [phone] Base) MCG/ACT inhaler INHALE 2 PUFFS INTO THE LUNGS EVERY 4 (FOUR) HOURS AS NEEDED FOR WHEEZING OR SHORTNESS OF BREATH. 07/04/21   Mayers, Cari S, PA-C   senna-docusate (SENOKOT-S) 8.6-50 MG tablet Take 1 tablet by mouth 2 (two) times daily. For constipation 07/25/21   Rai, Ripudeep K, MD  Torsemide 40 MG TABS Take 80 mg by mouth 2 (two) times daily. 06/27/21   Rafael Bihari, FNP    I have reviewed the patient's current medications.  Labs:  Results for orders placed or performed during the hospital encounter of 10/24/2021 (from the past 48 hour(s))  CBC with Differential     Status: Abnormal   Collection Time: 09/29/2021  6:53 PM  Result Value Ref Range   WBC 10.9 (H) 4.0 - 10.5 K/uL   RBC 4.61 4.22 - 5.81 MIL/uL   Hemoglobin 14.0 13.0 - 17.0 g/dL   HCT 45.2 39.0 - 52.0 %   MCV 98.0 80.0 - 100.0 fL   MCH 30.4 26.0 - 34.0 pg   MCHC 31.0 30.0 - 36.0 g/dL   RDW 23.4 (H) 11.5 - 15.5 %   Platelets 64 (L) 150 - 400 K/uL    Comment: Immature Platelet Fraction may be clinically indicated, consider ordering this additional test [id number] PLATELET COUNT CONFIRMED BY SMEAR    nRBC 3.0 (H) 0.0 - 0.2 %   Neutrophils Relative % 93 %   Neutro Abs 10.1 (H) 1.7 - 7.7 K/uL   Lymphocytes Relative 0 %   Lymphs Abs 0.0 (L) 0.7 - 4.0 K/uL   Monocytes Relative 4 %   Monocytes Absolute 0.5 0.1 - 1.0 K/uL   Eosinophils Relative 0 %   Eosinophils Absolute 0.0 0.0 - 0.5 K/uL   Basophils Relative 1 %   Basophils Absolute 0.1 0.0 - 0.1 K/uL   WBC Morphology VACUOLATED NEUTROPHILS    Immature Granulocytes 2 %   Abs Immature Granulocytes 0.22 (H) 0.00 - 0.07 K/uL   Polychromasia PRESENT    Target Cells PRESENT     Comment: Performed at [facility], [place] [address] [place], [place] [address]  Comprehensive metabolic panel     Status: Abnormal   Collection Time: 10/09/2021  6:53 PM  Result Value Ref Range   Sodium 144 135 - 145 mmol/L   Potassium 6.4 (HH) 3.5 - 5.1 mmol/L    Comment:  NO VISIBLE HEMOLYSIS CRITICAL RESULT CALLED TO, READ BACK BY AND VERIFIED WITH: [place] J @2104  BY BATTLET    Chloride 104 98 - 111 mmol/L   CO2 8  (L) 22 - 32 mmol/L   Glucose, Bld 51 (L) 70 - 99 mg/dL    Comment: Glucose reference range applies only to samples taken after fasting for at least 8 hours.   BUN 113 (H) 6 - 20 mg/dL    Comment: RESULTS CONFIRMED BY MANUAL DILUTION   Creatinine, Ser 4.02 (H) 0.61 - 1.24 mg/dL   Calcium 10.4 (H) 8.9 - 10.3 mg/dL   Total Protein 7.4 6.5 - 8.1 g/dL   Albumin 3.3 (L) 3.5 - 5.0 g/dL   AST 116 (H) 15 - 41 U/L   ALT 80 (H) 0 - 44 U/L    Comment: RESULTS CONFIRMED BY MANUAL DILUTION   Alkaline Phosphatase 161 (H) 38 - 126 U/L   Total Bilirubin 7.0 (H) 0.3 - 1.2 mg/dL   GFR, Estimated 17 (L) >60 mL/min    Comment: (NOTE) Calculated using the CKD-EPI Creatinine Equation (2021)    Anion gap 32 (H) 5 - 15    Comment: Electrolytes repeated to confirm. Performed at [facility], [place] [address] [place], [place] [address]   Ethanol     Status: None   Collection Time: 10/17/2021  6:53 PM  Result Value Ref Range   Alcohol, Ethyl (B) <10 <10 mg/dL    Comment: (NOTE) Lowest detectable limit for serum alcohol is 10 mg/dL.  For medical purposes only. Performed at [facility], [place] [address] [place], [place] [address]   Protime-INR     Status: Abnormal   Collection Time: 10/23/2021  7:23 PM  Result Value Ref Range   Prothrombin Time 33.2 (H) 11.4 - 15.2 seconds   INR 3.3 (H) 0.8 - 1.2    Comment: (NOTE) INR goal varies based on device and disease states. Performed at [facility], [place] [address] [place], [place] [address]   APTT     Status: Abnormal   Collection Time: 10/22/2021  7:23 PM  Result Value Ref Range   aPTT 38 (H) 24 - 36 seconds    Comment:        IF BASELINE aPTT IS ELEVATED, SUGGEST PATIENT RISK ASSESSMENT BE USED TO DETERMINE APPROPRIATE ANTICOAGULANT THERAPY. Performed at [facility], [place] [address], [place], [place] [address]   Resp Panel by RT-PCR (Flu A&B, Covid) Nasopharyngeal Swab      Status: None   Collection Time: 10/24/2021  7:50 PM   Specimen: Nasopharyngeal Swab; Nasopharyngeal(NP) swabs in vial transport medium  Result Value Ref Range   SARS Coronavirus 2 by RT PCR NEGATIVE NEGATIVE    Comment: (NOTE) SARS-CoV-2 target nucleic acids are NOT DETECTED.  The SARS-CoV-2 RNA is generally detectable in upper respiratory specimens during the acute phase of infection. The lowest concentration of SARS-CoV-2 viral copies this assay can detect is 138 copies/mL. A negative result does not preclude SARS-Cov-2 infection and should not be used as the sole basis for treatment or other patient management decisions. A negative result may occur with  improper specimen collection/handling, submission of specimen other than nasopharyngeal swab, presence of viral mutation(s) within the areas targeted by this assay, and inadequate number of viral copies(<138 copies/mL). A negative result must be combined with clinical observations, patient history, and epidemiological information. The expected result is Negative.  Fact Sheet for Patients:  EntrepreneurPulse.com.au  Fact Sheet for Healthcare Providers:  IncredibleEmployment.be  This test is no t yet approved or cleared by the [place] FDA and  has been authorized for detection and/or diagnosis of SARS-CoV-2 by FDA under an Emergency Use Authorization (EUA). This EUA will remain  in effect (meaning this test can be used) for the duration of the COVID-19 declaration under Section 564(b)(1) of the Act, 21 U.S.C.section 360bbb-3(b)(1), unless the authorization is terminated  or revoked sooner.       Influenza A by PCR NEGATIVE NEGATIVE   Influenza B by PCR NEGATIVE NEGATIVE    Comment: (NOTE) The Xpert Xpress SARS-CoV-2/FLU/RSV plus assay is intended as an aid in the diagnosis of influenza from Nasopharyngeal swab specimens and should not be used as a sole basis for treatment. Nasal  washings and aspirates are unacceptable for Xpert Xpress SARS-CoV-2/FLU/RSV testing.  Fact Sheet for Patients: EntrepreneurPulse.com.au  Fact Sheet for Healthcare Providers: IncredibleEmployment.be  This test is not yet approved or cleared by the [place] FDA and has been authorized for detection and/or diagnosis of SARS-CoV-2 by FDA under an Emergency Use Authorization (EUA). This EUA will remain in effect (meaning this test can be used) for the duration of the COVID-19 declaration under Section 564(b)(1) of the Act, 21 U.S.C. section 360bbb-3(b)(1), unless the authorization is terminated or revoked.  Performed at [facility], [place] [address] [place], [place] [address]   CK     Status: None   Collection Time: 10/06/2021  7:51 PM  Result Value Ref Range   Total CK 389 49 - 397 U/L    Comment: Performed at [facility], [place] [address] [place], [place] [address]  Brain natriuretic peptide     Status: Abnormal   Collection Time: 10/18/2021  7:51 PM  Result Value Ref Range   B Natriuretic Peptide 4,312.7 (H) 0.0 - 100.0 pg/mL    Comment: Performed at [facility], [place] [address], [place], [place] [address]  Troponin I (High Sensitivity)     Status: Abnormal   Collection Time: 10/01/2021  7:51 PM  Result Value Ref Range   Troponin I (High Sensitivity) 426 (HH) <18 ng/L    Comment: CRITICAL RESULT CALLED TO, READ BACK BY AND VERIFIED WITH: Alanson Aly @ 2154 BY BATTLET (NOTE) Elevated high sensitivity troponin I (hsTnI) values and significant  changes across serial measurements may suggest ACS but many other  chronic and acute conditions are known to elevate hsTnI results.  Refer to the Links section for chest pain algorithms and additional  guidance. Performed at [facility], [place] [address], [place], [place] [address]   I-stat chem 8, ED (not at [facility] or [facility])      Status: Abnormal   Collection Time: 10/22/2021  7:55 PM  Result Value Ref Range   Sodium 136 135 - 145 mmol/L   Potassium 5.9 (H) 3.5 - 5.1 mmol/L   Chloride 111 98 - 111 mmol/L   BUN 111 (H) 6 - 20 mg/dL   Creatinine, Ser 4.00 (H) 0.61 - 1.24 mg/dL   Glucose, Bld 71 70 - 99 mg/dL    Comment: Glucose reference range applies only to samples taken after fasting for at least 8 hours.   Calcium, Ion 0.98 (L) 1.15 - 1.40 mmol/L   TCO2 13 (L) 22 - 32 mmol/L   Hemoglobin 14.6 13.0 - 17.0 g/dL   HCT 43.0 39.0 - 52.0 %  Blood gas, venous (at [facility] and [facility],  not at [facility])     Status: Abnormal   Collection Time: 10/03/2021  9:22 PM  Result Value Ref Range   pH, Ven 7.275 7.250 - 7.430   pCO2, Ven 27.0 (L) 44.0 - 60.0 mmHg   pO2, Ven 49.5 (H) 32.0 - 45.0 mmHg   Bicarbonate 12.2 (L) 20.0 - 28.0 mmol/L   Acid-base deficit 13.1 (H) 0.0 - 2.0 mmol/L   O2 Saturation 69.3 %   Patient temperature 98.6     Comment: Performed at [facility], [place] [address], [place], [place] [address]  CBG monitoring, ED (now and then every hour for 3 hours)     Status: Abnormal   Collection Time: 10/14/2021 10:00 PM  Result Value Ref Range   Glucose-Capillary 136 (H) 70 - 99 mg/dL    Comment: Glucose reference range applies only to samples taken after fasting for at least 8 hours.     ROS:  Pertinent items noted in HPI and remainder of comprehensive ROS otherwise negative.  Physical Exam: Vitals:   10/08/2021 1945 10/21/2021 2151  BP:  97/80  Pulse:  (!) 119  Resp:  (!) 31  Temp: (!) 95.9 F (35.5 C)   SpO2:  98%     General exam: Critically ill looking male with heating blanket on, alert awake but seems to be agitated. Respiratory system: Clear to auscultation. Respiratory effort normal. No wheezing or crackle Cardiovascular system: S1 & S2 heard, RRR.  Bilateral lower extremity pitting edema present. Gastrointestinal system: Abdomen is nondistended, soft and nontender. Normal bowel sounds  heard. Central nervous system: Alert awake and following only simple commands. Extremities: Edema present.  No cyanosis or clubbing Skin: No rashes, lesions or ulcers Psychiatry: Hard to assess.  Assessment/Plan:  #Acute kidney injury on CKD stage III presumably due to CHF exacerbation/cardiorenal syndrome.  UA and kidney ultrasound pending.  LVEF around 20% with multiple previous admission for low cardiac output heart failure.  Elevated BNP level from his baseline.  He is currently on Levophed for cardiogenic shock.  Discussed with the ER physician and ICU team to start inotrope to increase cardiac output.  He already received IV Lasix. I will continue strict ins and out, close monitoring of lab results and hemodynamics.  Manage hyperkalemia medically.  May need CRRT if no improvement.  Patient responded yes to dialysis if needed.  #Hyperkalemia: Patient received IV fluid, Lasix, insulin, dextrose and calcium gluconate in ER.  I will order Lokelma as well if he can take orally.  Monitor lab.  May need CRRT if no improvement as discussed above.  #Acute on chronic systolic and diastolic CHF: Received a dose of Lasix IV.  Recommend to start inotropes to increase cardiac output.  Cardiology was consulted.  Diuretics when BP improves.  #Anion gap metabolic acidosis due to renal failure.  Lactic acid level pending.  Continue conservative management.  Hold further IV fluid because of CHF.  I have discussed with ER physician as well as ICU admitting physician about the plan.  Noted ICU short staffs tonight.   Marq Rebello Tanna Furry 10/21/2021, 10:06 PM  [facility].

## 2021-09-28 NOTE — ED Notes (Signed)
Assumed care of pt at [place].  Noted pt did not have vial signs, requested vital signs upon arrival.

## 2021-09-28 NOTE — ED Triage Notes (Signed)
Per EMS, patient from motel, SOB today. Reports sitting outside for the last four hours in the rain. Uncooperative with EMS. Unwilling to answer questions per EMS. Lungs clear.

## 2021-09-28 NOTE — ED Notes (Signed)
MD notified that pt BP has not improved with 572mL bolus and map is below 60.

## 2021-09-28 NOTE — ED Notes (Signed)
Timeout initiated for intubation of pt. MD Tyler Wong verbalized pt name, DOB, verified by pt armband and several members of staff.

## 2021-09-28 NOTE — ED Notes (Addendum)
Per provider, hold off on intubation of patient, since pt stabilized on Levophed.

## 2021-09-28 NOTE — ED Notes (Signed)
117mcg Phenylephrine administered

## 2021-09-28 NOTE — Progress Notes (Signed)
Pt being followed by [facility] for Sepsis protocol. 

## 2021-09-28 NOTE — ED Notes (Signed)
Pt on bear hugger.

## 2021-09-28 NOTE — ED Provider Notes (Addendum)
[facility] DEPT Provider Note   CSN: [id number] Arrival date & time: 10/17/2021  1709     History No chief complaint on file.   Tyler Wong is a 55 y.o. male.  HPI   55 y.o. 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  The patient endorsed shortness of breath but denied chest pain on arrival on my questioning but had reportedly endorsed some chest discomfort in triage.  He does have a history of cocaine use.  He denies any recent cocaine use.  He reportedly has a history of severe CHF and is not a candidate for milrinone due to his frequent cocaine use.  Per EMS, the patient had been sitting outside in the rain for 4 hours.  He had been uncooperative with EMS and unwilling to answer questions.  On arrival, the patient's rectal temperature was 96.3.  A bear hugger was applied.  The patient was found to be tachycardic and hypothermic which in the setting of shortness of breath is concerning for possible sepsis. Level 5 caveat due to patient confusion.  Past Medical History:  Diagnosis Date   CAD in native artery 07/17/2014   STEMI- LAD stenosis with DES   CHF (congestive heart failure) (HCC)    Cocaine abuse (HCC)    COPD (chronic obstructive pulmonary disease) (HCC)    Diabetes mellitus without complication ([place])    Dyspnea    Gastric cancer ([place]) 11/01/2020   GERD (gastroesophageal reflux disease)    Hypertension    ST elevation myocardial infarction (STEMI) involving left anterior descending (LAD) coronary artery with complication ([place])    Tobacco abuse 07/17/2014    Patient Active Problem List   Diagnosis Date Noted   Nicotine dependence, cigarettes,  uncomplicated 67/20/9470   Type 2 diabetes mellitus with stage 3b chronic kidney disease, without long-term current use of insulin ([place]) 07/23/2021   Hyperkalemia 06/18/2021   Lactic acidosis 06/18/2021   RUQ abdominal pain    Cocaine abuse ([place]) 06/17/2021   COPD (chronic obstructive pulmonary disease) ([place]) 06/17/2021   Esophageal candidiasis ([place]) 06/17/2021   Port-A-Cath in place 05/22/2021   Acute dyspnea    CHF exacerbation ([place]) 05/13/2021   Gastric cancer ([place]) 11/29/2020   Abdominal pain, generalized 11/05/2020   Congestive heart failure (CHF) ([place]) 11/01/2020   CHF (congestive heart failure) ([place]) 10/31/2020   Palliative care by specialist    Encounter for laboratory testing for COVID-19 virus    Acute systolic HF (heart failure) ([place]) 10/03/2020   Acute on chronic systolic CHF (congestive heart failure) ([place]) 10/03/2020   AKI (acute kidney injury) ([place])    Acute respiratory disease due to COVID-19 virus 09/20/2020   Acute respiratory failure due to COVID-19 ([place]) 09/19/2020   Cardiorenal syndrome 06/29/2020   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   Acute on chronic clinical systolic heart failure ([place]) 06/22/2020   Acute on chronic combined systolic and diastolic CHF (congestive heart failure) ([place]) 06/22/2020   Palliative care encounter    Cardiogenic shock ([place]) 04/11/2020   CKD (chronic kidney disease) stage 3, GFR 30-59 ml/min (HCC) 04/11/2020   Symptomatic anemia 04/09/2020   Acute on chronic HFrEF (heart failure with reduced ejection fraction) ([place]) 05/05/2019  HFrEF (heart failure with reduced ejection fraction) ([place]) 05/04/2019   Acute combined systolic and diastolic congestive heart failure ([place]) 10/02/2018   Hepatitis C 04/13/2017   Current non-adherence to medical treatment 04/13/2017   Acute on chronic combined systolic (congestive) and diastolic (congestive) heart failure ([place]) 04/13/2017   HTN (hypertension) 04/12/2017   Insomnia  01/18/2017   Chronic combined systolic and diastolic congestive heart failure ([place]) 01/04/2017   Hyperlipidemia 01/04/2017   Chest pain 12/30/2016   Coronary artery disease involving native coronary artery of native heart without angina pectoris 07/18/2014   Tobacco abuse 07/17/2014   Cocaine use 07/17/2014    Past Surgical History:  Procedure Laterality Date   BIOPSY  11/05/2020   Procedure: BIOPSY;  Surgeon: Wilford Corner, MD;  Location: [facility];  Service: Endoscopy;;   BIOPSY  06/20/2021   Procedure: BIOPSY;  Surgeon: Ronald Lobo, MD;  Location: [facility];  Service: Endoscopy;;   CORONARY ANGIOPLASTY WITH STENT PLACEMENT  07/18/14   resolute DES to LAD STEMI   ESOPHAGOGASTRODUODENOSCOPY N/A 11/05/2020   Procedure: ESOPHAGOGASTRODUODENOSCOPY (EGD);  Surgeon: Wilford Corner, MD;  Location: [facility];  Service: Endoscopy;  Laterality: N/A;   ESOPHAGOGASTRODUODENOSCOPY N/A 06/20/2021   Procedure: ESOPHAGOGASTRODUODENOSCOPY (EGD);  Surgeon: Ronald Lobo, MD;  Location: [facility] ENDOSCOPY;  Service: Endoscopy;  Laterality: N/A;   IR IMAGING GUIDED PORT INSERTION  12/06/2020   LEFT HEART CATH N/A 07/17/2014   Procedure: LEFT HEART CATH;  Surgeon: [name], MD;  Location: [facility] CATH LAB;  Service: Cardiovascular;  Laterality: N/A;   PERCUTANEOUS CORONARY STENT INTERVENTION (PCI-S)  07/17/2014   Procedure: PERCUTANEOUS CORONARY STENT INTERVENTION (PCI-S);  Surgeon: [name], MD;  Location: [facility] CATH LAB;  Service: Cardiovascular;;   RIGHT/LEFT HEART CATH AND CORONARY ANGIOGRAPHY N/A 10/03/2018   Procedure: RIGHT/LEFT HEART CATH AND CORONARY ANGIOGRAPHY;  Surgeon: [name], MD;  Location: [facility] CV LAB;  Service: Cardiovascular;  Laterality: N/A;       Family History  Problem Relation Age of Onset   Cirrhosis Father    Heart attack Mother    Heart attack Sister    Heart failure Sister    Heart attack Brother     Social History   Tobacco Use    Smoking status: Every Day    Packs/day: 0.25    Years: 30.00    Pack years: 7.50    Types: Cigarettes   Smokeless tobacco: Never   Tobacco comments:    .5 PK PER DAY  Vaping Use   Vaping Use: Never used  Substance Use Topics   Alcohol use: Yes   Drug use: Yes    Types: Cocaine, "Crack" cocaine    Comment: heroin    Home Medications Prior to Admission medications   Medication Sig Start Date End Date Taking? Authorizing Provider  ASPIRIN LOW DOSE 81 MG EC tablet TAKE 1 TABLET (81 MG TOTAL) BY MOUTH DAILY (MORNING) 03/05/21   Bensimhon, Shaune Pascal, MD  atorvastatin (LIPITOR) 80 MG tablet Take 1 tablet (80 mg total) by mouth daily. 11/26/20   [name], MD  BIDIL 20-37.5 MG tablet Take 1 tablet by mouth 3 (three) times daily. 09/18/21   [provider]  ENTRESTO 24-26 MG Take 1 tablet by mouth 2 (two) times daily. 09/18/21   [provider]  famotidine (PEPCID) 20 MG tablet Take 1 tablet (20 mg total) by mouth daily. 07/26/21   Rai, Vernelle Emerald, MD  gabapentin (NEURONTIN) 100 MG capsule Take 1 capsule (100  mg total) by mouth 2 (two) times daily. 05/21/21   Nita Sells, MD  JARDIANCE 10 MG TABS tablet TAKE 1 TABLET (10 MG TOTAL) BY MOUTH DAILY BEFORE BREAKFAST. 07/29/21   [name], MD  metoCLOPramide (REGLAN) 5 MG tablet Take 1 tablet (5 mg total) by mouth every 8 (eight) hours as needed for nausea or vomiting. 07/25/21 07/25/22  Rai, Vernelle Emerald, MD  metolazone (ZAROXOLYN) 2.5 MG tablet Take 1 tablet (2.5 mg total) by mouth once a week. On every Monday morning.  Take 30 minutes prior to taking AM dose of torsemide. 07/25/21   Rai, Vernelle Emerald, MD  Multiple Vitamins-Minerals (MULTIVITAMIN WITH MINERALS) tablet Take 1 tablet by mouth daily.    [provider]  nicotine (NICODERM CQ - DOSED IN MG/24 HR) 7 mg/24hr patch Place 1 patch (7 mg total) onto the skin daily. 05/22/21   Nita Sells, MD  pantoprazole (PROTONIX) 40 MG tablet TAKE 1 TABLET (40 MG  TOTAL) BY MOUTH 2 (TWO) TIMES DAILY. 07/29/21 08/28/21  Ladell Pier, MD  polyethylene glycol (MIRALAX) 17 g packet Take 17 g by mouth daily as needed. 07/17/21   Owens Shark, NP  potassium chloride SA (KLOR-CON) 20 MEQ tablet Take 2 tablets (40 mEq total) by mouth daily. 01/20/21   [name], MD  PROAIR HFA 108 [phone] Base) MCG/ACT inhaler INHALE 2 PUFFS INTO THE LUNGS EVERY 4 (FOUR) HOURS AS NEEDED FOR WHEEZING OR SHORTNESS OF BREATH. 07/04/21   Mayers, Cari S, PA-C  senna-docusate (SENOKOT-S) 8.6-50 MG tablet Take 1 tablet by mouth 2 (two) times daily. For constipation 07/25/21   Rai, Vernelle Emerald, MD  spironolactone (ALDACTONE) 25 MG tablet Take 12.5 mg by mouth daily. 09/18/21   [provider]  torsemide (DEMADEX) 20 MG tablet Take 80 mg by mouth 2 (two) times daily. 09/18/21   [provider]  Torsemide 40 MG TABS Take 80 mg by mouth 2 (two) times daily. 06/27/21   Rafael Bihari, FNP    Allergies    Patient has no known allergies.  Review of Systems   Review of Systems  Constitutional:  Negative for chills and fever.  HENT:  Negative for ear pain and sore throat.   Eyes:  Negative for pain and visual disturbance.  Respiratory:  Positive for cough and shortness of breath.   Cardiovascular:  Positive for chest pain and leg swelling. Negative for palpitations.  Gastrointestinal:  Positive for abdominal pain and nausea. Negative for vomiting.  Genitourinary:  Negative for dysuria and hematuria.  Musculoskeletal:  Negative for arthralgias and back pain.  Skin:  Negative for color change and rash.  Neurological:  Negative for seizures and syncope.  All other systems reviewed and are negative.  Physical Exam Updated Vital Signs BP (!) 86/55   Pulse (!) 123   Temp (!) 96.6 F (35.9 C)   Resp 17   Ht 5' 11" (1.803 m)   Wt 70.8 kg   SpO2 100%   BMI 21.77 kg/m   Physical Exam Vitals and nursing note reviewed.  Constitutional:      Appearance: He is  well-developed.  HENT:     Head: Normocephalic and atraumatic.  Eyes:     Conjunctiva/sclera: Conjunctivae normal.  Cardiovascular:     Rate and Rhythm: Regular rhythm. Tachycardia present.     Pulses: Normal pulses.     Comments: Distended neck veins, JVD present Pulmonary:     Effort: Pulmonary effort is normal. No respiratory  distress.     Breath sounds: Normal breath sounds.  Abdominal:     Palpations: Abdomen is soft.     Tenderness: There is no abdominal tenderness.  Musculoskeletal:     Cervical back: Neck supple.     Right lower leg: Edema present.     Left lower leg: Edema present.     Comments: 2+ pitting edema bilaterally  Skin:    Comments: Extremities cool, delayed cap refill  Neurological:     General: No focal deficit present.     Mental Status: He is alert. He is disoriented.     Cranial Nerves: No cranial nerve deficit.     Comments: Moving all four extremities spontaneously, no focal deficit.    ED Results / Procedures / Treatments   Labs (all labs ordered are listed, but only abnormal results are displayed) Labs Reviewed  CBC WITH DIFFERENTIAL/PLATELET - Abnormal; Notable for the following components:      Result Value   WBC 10.9 (*)    RDW 23.4 (*)    Platelets 64 (*)    nRBC 3.0 (*)    Neutro Abs 10.1 (*)    Lymphs Abs 0.0 (*)    Abs Immature Granulocytes 0.22 (*)    All other components within normal limits  COMPREHENSIVE METABOLIC PANEL - Abnormal; Notable for the following components:   Potassium 6.4 (*)    CO2 8 (*)    Glucose, Bld 51 (*)    BUN 113 (*)    Creatinine, Ser 4.02 (*)    Calcium 10.4 (*)    Albumin 3.3 (*)    AST 116 (*)    ALT 80 (*)    Alkaline Phosphatase 161 (*)    Total Bilirubin 7.0 (*)    GFR, Estimated 17 (*)    Anion gap 32 (*)    All other components within normal limits  BLOOD GAS, VENOUS - Abnormal; Notable for the following components:   pCO2, Ven 27.0 (*)    pO2, Ven 49.5 (*)    Bicarbonate 12.2 (*)     Acid-base deficit 13.1 (*)    All other components within normal limits  URINALYSIS, ROUTINE W REFLEX MICROSCOPIC - Abnormal; Notable for the following components:   Color, Urine AMBER (*)    APPearance HAZY (*)    Hgb urine dipstick MODERATE (*)    Protein, ur 100 (*)    Bacteria, UA RARE (*)    All other components within normal limits  RAPID URINE DRUG SCREEN, HOSP PERFORMED - Abnormal; Notable for the following components:   Cocaine POSITIVE (*)    All other components within normal limits  LACTIC ACID, PLASMA - Abnormal; Notable for the following components:   Lactic Acid, Venous >9.0 (*)    All other components within normal limits  PROTIME-INR - Abnormal; Notable for the following components:   Prothrombin Time 33.2 (*)    INR 3.3 (*)    All other components within normal limits  APTT - Abnormal; Notable for the following components:   aPTT 38 (*)    All other components within normal limits  BRAIN NATRIURETIC PEPTIDE - Abnormal; Notable for the following components:   B Natriuretic Peptide 4,312.7 (*)    All other components within normal limits  BLOOD GAS, ARTERIAL - Abnormal; Notable for the following components:   pH, Arterial 7.123 (*)    pO2, Arterial 300 (*)    Bicarbonate 11.4 (*)    Acid-base deficit 17.3 (*)  All other components within normal limits  GLUCOSE, CAPILLARY - Abnormal; Notable for the following components:   Glucose-Capillary 157 (*)    All other components within normal limits  I-STAT CHEM 8, ED - Abnormal; Notable for the following components:   Potassium 5.9 (*)    BUN 111 (*)    Creatinine, Ser 4.00 (*)    Calcium, Ion 0.98 (*)    TCO2 13 (*)    All other components within normal limits  CBG MONITORING, ED - Abnormal; Notable for the following components:   Glucose-Capillary 136 (*)    All other components within normal limits  TROPONIN I (HIGH SENSITIVITY) - Abnormal; Notable for the following components:   Troponin I (High  Sensitivity) 426 (*)    All other components within normal limits  TROPONIN I (HIGH SENSITIVITY) - Abnormal; Notable for the following components:   Troponin I (High Sensitivity) 453 (*)    All other components within normal limits  RESP PANEL BY RT-PCR (FLU A&B, COVID) ARPGX2  CULTURE, BLOOD (ROUTINE X 2)  CULTURE, BLOOD (ROUTINE X 2)  URINE CULTURE  MRSA NEXT GEN BY PCR, NASAL  ETHANOL  CK  CBC  BASIC METABOLIC PANEL  BLOOD GAS, ARTERIAL  BASIC METABOLIC PANEL  BLOOD GAS, ARTERIAL  LACTIC ACID, PLASMA  LACTIC ACID, PLASMA  CBG MONITORING, ED  CBG MONITORING, ED    EKG None  Radiology CT ABDOMEN PELVIS WO CONTRAST  Result Date: 09/29/2021 CLINICAL DATA:  Abdominal pain and fever. History of gastric cancer and chronic kidney disease. EXAM: CT ABDOMEN AND PELVIS WITHOUT CONTRAST TECHNIQUE: Multidetector CT imaging of the abdomen and pelvis was performed following the standard protocol without IV contrast. COMPARISON:  CT abdomen and pelvis 06/17/2021. FINDINGS: Lower chest: The heart is enlarged, unchanged. There are small bilateral pleural effusions. There is minimal atelectasis in the left lung base. There is also small pericardial effusion. Hepatobiliary: Calcified granulomas again noted in the liver. The liver is otherwise within normal limits. Gallbladder is grossly within normal limits. No calcified gallstones are seen. Pancreas: Unremarkable. No pancreatic ductal dilatation or surrounding inflammatory changes. Spleen: Normal in size without focal abnormality. Adrenals/Urinary Tract: There is nonspecific bilateral adrenal thickening. There is no hydronephrosis. There is no urinary tract calculus identified. The bladder is decompressed by Foley catheter. Stomach/Bowel: There is diffuse colonic wall thickening. The appendix appears within normal limits. There is no bowel obstruction or pneumatosis. There is no free air. Small bowel and stomach are grossly within normal limits.  Vascular/Lymphatic: No significant vascular findings are present. No enlarged abdominal or pelvic lymph nodes. Reproductive: Prostate is unremarkable. Other: There is a small to moderate amount of ascites throughout the abdomen and pelvis. There is diffuse body wall edema. Musculoskeletal: No acute or significant osseous findings. IMPRESSION: 1. Diffuse colonic wall thickening worrisome for nonspecific colitis. No free air or bowel obstruction. 2. Small to moderate amount of ascites.  Body wall edema. 3. Small pleural effusions. 4. Stable cardiomegaly with new small pericardial effusion. Electronically Signed   By: Ronney Asters M.D.   On: 09/29/2021 00:00   DG Chest 2 View  Result Date: 10/20/2021 CLINICAL DATA:  Shortness of breath. EXAM: CHEST - 2 VIEW COMPARISON:  Chest x-ray 07/22/2021. FINDINGS: Left chest port catheter tip projects over the SVC, unchanged. The heart is markedly enlarged, unchanged. There is some linear atelectasis in the right upper lung. There is no focal lung infiltrate, pleural effusion or pneumothorax identified. The osseous structures are within normal  limits. IMPRESSION: 1. Stable cardiomegaly. 2. No evidence for pneumonia or edema. Electronically Signed   By: Ronney Asters M.D.   On: 10/27/2021 18:48   DG Abd 1 View  Result Date: 09/29/2021 CLINICAL DATA:  Status post orogastric tube placement. EXAM: ABDOMEN - 1 VIEW COMPARISON:  None. FINDINGS: An orogastric tube is seen with its distal tip noted within the body of the stomach. The distal side hole is approximately 13 cm distal to the gastroesophageal junction. The bowel gas pattern is normal. No radio-opaque calculi or other significant radiographic abnormality are seen. IMPRESSION: Orogastric tube positioning, as described above. Electronically Signed   By: Virgina Norfolk M.D.   On: 09/29/2021 01:09   CT HEAD WO CONTRAST (5MM)  Result Date: 09/29/2021 CLINICAL DATA:  Altered mental status EXAM: CT HEAD WITHOUT  CONTRAST TECHNIQUE: Contiguous axial images were obtained from the base of the skull through the vertex without intravenous contrast. COMPARISON:  None. FINDINGS: Brain: No evidence of acute infarction, hemorrhage, hydrocephalus, extra-axial collection or mass lesion/mass effect. Area of chronic ischemic change is noted in the right temporoparietal region. Vascular: No hyperdense vessel or unexpected calcification. Skull: Normal. Negative for fracture or focal lesion. Sinuses/Orbits: No acute finding. Other: Mildly displaced left zygomatic arch fracture is noted with some subcutaneous emphysema. Nasal bone fracture on the left is noted as well. IMPRESSION: Chronic white matter ischemic change. No acute intracranial abnormality noted. Left nasal bone fracture and left zygomatic arch fracture with subcutaneous edema identified. No significant callus is noted in these likely represent acute to subacute fractures. Electronically Signed   By: Inez Catalina M.D.   On: 09/29/2021 00:05   DG Chest Portable 1 View  Result Date: 10/08/2021 CLINICAL DATA:  Acute respiratory failure.  Intubation. EXAM: PORTABLE CHEST 1 VIEW COMPARISON:  Prior today FINDINGS: A new endotracheal tube is seen in place with tip approximately 6 cm above the carina. Left-sided power port remains in appropriate position. Moderate cardiomegaly is stable.  Both lungs remain clear. IMPRESSION: New endotracheal tube tip approximately 6 cm above the carina. Stable cardiomegaly. No active lung disease. Electronically Signed   By: Marlaine Hind M.D.   On: 10/14/2021 23:29   DG Chest Port 1 View  Result Date: 10/20/2021 CLINICAL DATA:  Chest pain and shortness of breath. EXAM: PORTABLE CHEST 1 VIEW COMPARISON:  Prior today FINDINGS: Cardiomegaly remains stable. Both lungs are clear. No evidence of pneumothorax or pleural effusion. Left-sided power port remains in appropriate position. IMPRESSION: Stable cardiomegaly. No active lung disease.  Electronically Signed   By: Marlaine Hind M.D.   On: 10/03/2021 22:08    Procedures .Critical Care Performed by: Regan Lemming, MD Authorized by: Regan Lemming, MD   Critical care provider statement:    Critical care time (minutes):  125   Critical care was necessary to treat or prevent imminent or life-threatening deterioration of the following conditions:  Cardiac failure, sepsis and shock   Critical care was time spent personally by me on the following activities:  Discussions with consultants, evaluation of patient's response to treatment, examination of patient, ordering and performing treatments and interventions, ordering and review of laboratory studies, ordering and review of radiographic studies, pulse oximetry, re-evaluation of patient's condition, obtaining history from patient or surrogate and review of old charts   I assumed direction of critical care for this patient from another provider in my specialty: yes     Care discussed with: admitting provider   Procedure Name: Intubation Date/Time: 10/01/2021 10:43  PM Performed by: Regan Lemming, MD Pre-anesthesia Checklist: Patient identified, Patient being monitored, Emergency Drugs available, Timeout performed and Suction available Oxygen Delivery Method: Nasal cannula Preoxygenation: Pre-oxygenation with 100% oxygen Induction Type: Rapid sequence Ventilation: Mask ventilation without difficulty Laryngoscope Size: Mac and 3 Grade View: Grade II Tube size: 7.5 mm Number of attempts: 1 Airway Equipment and Method: Video-laryngoscopy Placement Confirmation: ETT inserted through vocal cords under direct vision, CO2 detector and Breath sounds checked- equal and bilateral Secured at: 25 cm Tube secured with: ETT holder Dental Injury: Bloody posterior oropharynx     Ultrasound ED Peripheral IV (Provider)  Date/Time: 10/19/2021 11:05 PM Performed by: Regan Lemming, MD Authorized by: Regan Lemming, MD   Procedure details:     Indications: hypotension and poor IV access     Skin Prep: chlorhexidine gluconate     Location: Right EJ.   Angiocath:  18 G   Bedside Ultrasound Guided: Yes     Images: not archived     Patient tolerated procedure without complications: Yes     Dressing applied: Yes   Ultrasound ED Echo  Date/Time: 10/02/2021 8:12 PM Performed by: Regan Lemming, MD Authorized by: Regan Lemming, MD   Procedure details:    Indications: hypotension     Views: subxiphoid, parasternal long axis view, parasternal short axis view and IVC view     Images: not archived     Limitations:  Body habitus and patient compliance Findings:    Pericardium: no pericardial effusion     LV Function: severly depressed (<30%)   Impression:    Impression: decreased contractility     Medications Ordered in ED Medications  etomidate (AMIDATE) injection 20 mg (has no administration in time range)  sodium chloride flush (NS) 0.9 % injection 10-40 mL (has no administration in time range)  sodium chloride flush (NS) 0.9 % injection 10-40 mL (has no administration in time range)  Chlorhexidine Gluconate Cloth 2 % PADS 6 each (has no administration in time range)  sodium bicarbonate 150 mEq in dextrose 5 % 1,150 mL infusion ( Intravenous New Bag/Given 09/30/2021 2325)  sodium bicarbonate injection 150 mEq (has no administration in time range)  0.9 %  sodium chloride infusion (has no administration in time range)  norepinephrine (LEVOPHED) 51m in 2511mpremix infusion (has no administration in time range)  DOBUTamine (DOBUTREX) infusion 4000 mcg/mL (5 mcg/kg/min  70.8 kg Intravenous New Bag/Given 10/27/2021 2257)  sodium zirconium cyclosilicate (LOKELMA) packet 10 g (has no administration in time range)  insulin aspart (novoLOG) injection 10 Units (has no administration in time range)  dextrose 50 % solution 50 mL (has no administration in time range)  vancomycin (VANCOREADY) IVPB 1500 mg/300 mL (has no administration in  time range)  ceFEPIme (MAXIPIME) 2 g in sodium chloride 0.9 % 100 mL IVPB (has no administration in time range)  vancomycin variable dose per unstable renal function (pharmacist dosing) (has no administration in time range)  calcium gluconate inj 10% (1 g) URGENT USE ONLY! (has no administration in time range)  docusate (COLACE) 50 MG/5ML liquid 100 mg (has no administration in time range)  polyethylene glycol (MIRALAX / GLYCOLAX) packet 17 g (has no administration in time range)  chlorhexidine gluconate (MEDLINE KIT) (PERIDEX) 0.12 % solution 15 mL (has no administration in time range)  MEDLINE mouth rinse (has no administration in time range)  fentaNYL (SUBLIMAZE) injection 100 mcg (has no administration in time range)  0.9 %  sodium chloride infusion (has no administration in time  range)  0.9 %  sodium chloride infusion (has no administration in time range)  0.9 %  sodium chloride infusion (has no administration in time range)  lactated ringers bolus 500 mL (0 mLs Intravenous Stopped 10/13/2021 2039)  cefTRIAXone (ROCEPHIN) 1 g in sodium chloride 0.9 % 100 mL IVPB (0 g Intravenous Stopped 10/11/2021 2008)  azithromycin (ZITHROMAX) 500 mg in sodium chloride 0.9 % 250 mL IVPB (0 mg Intravenous Stopped 10/02/2021 2121)  furosemide (LASIX) injection 80 mg (80 mg Intravenous Given 10/20/2021 2012)  calcium gluconate inj 10% (1 g) URGENT USE ONLY! (1 g Intravenous Given 10/13/2021 2150)  dextrose 50 % solution 50 mL (50 mLs Intravenous Given 10/04/2021 2151)  rocuronium (ZEMURON) injection 100 mg (100 mg Intravenous Given 10/17/2021 2233)    ED Course  I have reviewed the triage vital signs and the nursing notes.  Pertinent labs & imaging results that were available during my care of the patient were reviewed by me and considered in my medical decision making (see chart for details).    MDM Rules/Calculators/A&P                           55 y.o. 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   On arrival, the patient was hypothermic.  Initial vitals were not obtained in triage for an unclear reason.  Once the patient was bedded, the patient was found to be hypothermic, tachycardic to 107, not tachypneic, soft blood pressure 98/72, saturating 93% on room air.  He was placed on nasal cannula O2 and initial work-up for possible sepsis initiated.  The patient endorses shortness of breath, worsening bilateral lower extremity edema.  He appears volume overloaded with distended neck veins, 2+ pitting edema, mild tachypnea noted.  He was initially hemodynamically stable.  Given concern for possible infection, he was administered Rocephin and azithromycin to cover for community acquired pneumonia.  Initial EKG revealed ST segment elevations in the inferior leads.  I discussed this EKG with cardiology on-call (Dr. Martinique) who felt that EKG findings were not consistent with STEMI given similarity to prior EKGs.  At the time the patient denied any chest pain.  He denied any recent cocaine use but has a frequent history of cocaine use and abuse.  Concern for heart failure exacerbation given the patient's volume overload.  Additional concern for possible sepsis.  A gentle fluid challenge was administered with 500 cc of LR.  The patient was subsequently administered 80 mg of IV Lasix for diuresis.  Point-of-care ultrasound was performed which revealed no significant pericardial effusion, poor EF.    Patient subsequently began to decompensate in the emergency department with hypotension noted with a blood pressure of 79/57, worsening tachycardia with a pulse of 139.  He was placed on Levophed with an escalating requirement.  Initial istat BMP  revealed concern for hyperkalemia and acute on chronic renal failure.  I-STAT revealed a potassium of 5.9, CMP subsequently resulted with a potassium of 6.4.  The patient was temporized with calcium, insulin and glucose.  He was found to be hypoglycemic to 51 and was administered dextrose IV.  With his hypotension, the patient became agitated and required  restraints.  His blood pressure began to improve with Levophed administration however he had an escalating Levophed requirement to 40 MCG per minute.  I am concerned for cardiogenic shock given the patient's severe heart failure. Further discussed the case with Dr. Kalman Shan of cardiology on call who was in agreement with plan to initiate further inotropic support with Dobutamine. He was found to have an elevated BNP to 4313 with an elevated troponin to 453.  Repeat EKGs revealed persistent ST segment changes in the inferior leads.  I discussed the case with on-call cardiology who felt that the case was more consistent with CHF exacerbation and cardiogenic shock rather than ACS.  The patient was started on dobutamine infusion to help maintain blood pressures.  Lactic acid was found to be greater than 9.  Patient was initially found to have an anion gap metabolic acidosis likely due to combination of renal failure and lactic acidosis from shock  In discussion with critical care will administer 3 amps of bicarb.  Nephrology (Dr. Moshe Cipro) was consulted for acute on chronic renal failure in the setting of likely cardiogenic shock with possible component of septic shock.  Nephrology will continue to monitor labs and hemodynamics.  The patient may require CRRT if no improvement.  Plan to medically manage with temporizing measures for now.   Discussed the case with pulmonary critical care.  Following bedside evaluation, pulmonary critical care requested that the patient be intubated for airway protection.  This was performed without complication.  The patient  maintained hemodynamics following intubation with 148mg of Neosynephrine administered during intubation. ETT visualized through the cords, bilateral breath sounds noted, positive color change, and good waveform on end tidal capnography. ETT 6cm above carina on CXR.  CT of the head and abdomen pelvis was ordered to evaluate further given the patient's complaint of abdominal discomfort and given his worsening mental status, pending on admission.  A right sided UKoreaguided EJ was placed.  The patient's PowerPort was accessed in the ED for central venous access.  He was subsequently admitted to the ICU in critical condition.   Final Clinical Impression(s) / ED Diagnoses Final diagnoses:  Acute on chronic congestive heart failure, unspecified heart failure type ([place]  Sepsis with acute renal failure and septic shock, due to unspecified organism, unspecified acute renal failure type ([place]  Shock ([place]  Cardiogenic shock ([facility]    Rx / DC Orders ED Discharge Orders     None        LRegan Lemming MD 09/29/21 0040        LRegan Lemming MD 09/29/21 [id number]   LRegan Lemming MD 10/04/21 2115

## 2021-09-28 NOTE — ED Notes (Signed)
Rectal temp 96.3, Bair hugger applied, immediately.

## 2021-09-28 NOTE — ED Provider Notes (Signed)
Emergency Medicine Provider Triage Evaluation Note  Tyler Wong , a 55 y.o. male  was evaluated in triage.  Pt complains of shortness of breath and chest pain.  History difficult as patient only responds to certain questions.  Reports chest pain and shortness of breath which is new.  Per EMS, he was found sitting in the rain.  On chart review, patient with a history of cocaine use, CAD, CHF, hep C, CKD, cancer, DM   Review of Systems  Positive: Sob, cp Negative: Abd pain  Physical Exam  Ht 5\' 11"  (1.803 m)   Wt 70.8 kg   BMI 21.77 kg/m  Gen:   Choosing not to respond verbally to most questions Resp:  Normal effort MSK:   Remains huddled under a blanket  Unable to get vital signs due to pt being cold  Medical Decision Making  Medically screening exam initiated at 5:43 PM.  Appropriate orders placed.  Tyler Wong was informed that the remainder of the evaluation will be completed by another provider, this initial triage assessment does not replace that evaluation, and the importance of remaining in the ED until their evaluation is complete.  [address]   Tyler Wong, [place] 10/05/2021 1818    Tyler Lemming, MD 09/27/2021 2016

## 2021-09-28 NOTE — ED Notes (Signed)
20mg s Etomidate given. Followed by 100mg s Rocuronium.

## 2021-09-28 NOTE — ED Notes (Signed)
X-ray at bedside

## 2021-09-28 NOTE — H&P (Signed)
NAME:  Tyler Wong, MRN:  [id number], DOB:  May 29, 1966, LOS: 0 ADMISSION DATE:  10/08/2021, CONSULTATION DATE: 10/19/2021 REFERRING MD:  , CHIEF COMPLAINT: Shortness of breath  History of Present Illness:  55 year old male with history of hypertension diabetes mellitus coronary artery disease and systolic congestive heart failure ejection fraction 20% polysubstance abuse who was brought into the EMS due to shortness of breath he was found to be hypothermic and hypoglycemic he was sitting in the rain in the cold for a few hours.  Upon arrival he was found to be hypotensive with systolic of 96/75 with labs showing acute on chronic kidney disease with potassium of 5.9.  Patient was acidotic with pH of 7.27 and bicarb of 8.  Patient was bolused with 500 LR and then received IV Lasix as per notes.  I was called to come and assess patient patient was very agitated tachypneic he was on Levophed 40 mics and is being treated for hyperkalemia.  Patient was evaluated by nephrology and will continue medical management and possible need for CRRT if no improvement.  Patient was agitated very hard to get any history from.  Pertinent  Medical History   -Hypertension -Diabetes mellitus -CAD -Congestive heart failure ejection fraction 20% -Cocaine and polysubstance abuse -Diabetes mellitus     Objective   Blood pressure 97/80, pulse (!) 119, temperature (!) 95.9 F (35.5 C), temperature source Rectal, resp. rate (!) 31, height 5\' 11"  (1.803 m), weight 70.8 kg, SpO2 98 %.       No intake or output data in the 24 hours ending 10/27/2021 2247 Filed Weights   10/24/2021 1734  Weight: 70.8 kg    Examination: General: Acutely ill tachypneic and moderate respite distress HENT: Dry mucous membranes Lungs: Clear equal air sound bilateral no crackles no wheezing Cardiovascular: Normal heart sound no added sounds or murmurs Abdomen: Soft no tenderness no guarding Extremities: Bilateral lower limb edema Neuro:  Awake alert oriented moving all extremities     Assessment & Plan:    Assessment: -Cardiogenic shock cannot rule out septic shock -Acute on chronic kidney disease -Hyperkalemia -Severe metabolic acidosis -Acute hypoxemic respiratory failure -Hypoglycemia    Plan: -Patient is being intubated for airway protection -Start Levophed and titrate to keep map above 60 -Start dobutamine at 5 mcg -Medical management for hyperkalemia as per orders -I will give 3 ampoules of sodium bicarb -Repeat labs -Patient will receive CT head abdomen and pelvis with no contrast -We appreciate nephrology follow-up and input -I will start empiric antibiotics with cefepime and vancomycin patient has old port and has history of drug abuse -I will bolus with 500 mL normal saline and start sodium bicarb drip  I have spent 45 minutes of critical care time this time was spent at bedside or in the unit this time was exclusive of any billable procedures patient is needing intensive care due to complex medical problems requiring complex medical decisions. Best Practice (right click and "Reselect all SmartList Selections" daily)     Labs   CBC: Recent Labs  Lab 10/02/2021 1853 10/04/2021 1955  WBC 10.9*  --   NEUTROABS 10.1*  --   HGB 14.0 14.6  HCT 45.2 43.0  MCV 98.0  --   PLT 64*  --     Basic Metabolic Panel: Recent Labs  Lab 10/25/2021 1853 10/11/2021 1955  NA 144 136  K 6.4* 5.9*  CL 104 111  CO2 8*  --   GLUCOSE 51* 71  BUN 113*  111*  CREATININE 4.02* 4.00*  CALCIUM 10.4*  --    GFR: Estimated Creatinine Clearance: 20.9 mL/min (A) (by C-G formula based on SCr of 4 mg/dL (H)). Recent Labs  Lab 10/09/2021 1853  WBC 10.9*    Liver Function Tests: Recent Labs  Lab 10/16/2021 1853  AST 116*  ALT 80*  ALKPHOS 161*  BILITOT 7.0*  PROT 7.4  ALBUMIN 3.3*   No results for input(s): LIPASE, AMYLASE in the last 168 hours. No results for input(s): AMMONIA in the last 168 hours.  ABG     Component Value Date/Time   HCO3 12.2 (L) 09/29/2021 2122   TCO2 13 (L) 10/01/2021 1955   ACIDBASEDEF 13.1 (H) 10/25/2021 2122   O2SAT 69.3 10/05/2021 2122     Coagulation Profile: Recent Labs  Lab 10/27/2021 1923  INR 3.3*    Cardiac Enzymes: Recent Labs  Lab 09/27/2021 1951  CKTOTAL 389    HbA1C: Hgb A1c MFr Bld  Date/Time Value Ref Range Status  07/23/2021 03:25 AM 6.8 (H) 4.8 - 5.6 % Final    Comment:    (NOTE)         Prediabetes: 5.7 - 6.4         Diabetes: >6.4         Glycemic control for adults with diabetes: <7.0   04/12/2020 04:03 AM 6.5 (H) 4.8 - 5.6 % Final    Comment:    (NOTE) Pre diabetes:          5.7%-6.4% Diabetes:              >6.4% Glycemic control for   <7.0% adults with diabetes     CBG: Recent Labs  Lab 10/01/2021 2200  GLUCAP 136*    Review of Systems:   All 12 point system review were unremarkable other than what is mentioned history present illness  Past Medical History:  He,  has a past medical history of CAD in native artery (07/17/2014), CHF (congestive heart failure) ([place]), Cocaine abuse ([place]), COPD (chronic obstructive pulmonary disease) ([place]), Diabetes mellitus without complication ([place]), Dyspnea, Gastric cancer ([place]) (11/01/2020), GERD (gastroesophageal reflux disease), Hypertension, ST elevation myocardial infarction (STEMI) involving left anterior descending (LAD) coronary artery with complication ([place]), and Tobacco abuse (07/17/2014).   Surgical History:   Past Surgical History:  Procedure Laterality Date   BIOPSY  11/05/2020   Procedure: BIOPSY;  Surgeon: Wilford Corner, MD;  Location: [facility] ENDOSCOPY;  Service: Endoscopy;;   BIOPSY  06/20/2021   Procedure: BIOPSY;  Surgeon: Ronald Lobo, MD;  Location: [place];  Service: Endoscopy;;   CORONARY ANGIOPLASTY WITH STENT PLACEMENT  07/18/14   resolute DES to LAD STEMI   ESOPHAGOGASTRODUODENOSCOPY N/A 11/05/2020   Procedure: ESOPHAGOGASTRODUODENOSCOPY (EGD);  Surgeon:  Wilford Corner, MD;  Location: [place];  Service: Endoscopy;  Laterality: N/A;   ESOPHAGOGASTRODUODENOSCOPY N/A 06/20/2021   Procedure: ESOPHAGOGASTRODUODENOSCOPY (EGD);  Surgeon: Ronald Lobo, MD;  Location: [facility] ENDOSCOPY;  Service: Endoscopy;  Laterality: N/A;   IR IMAGING GUIDED PORT INSERTION  12/06/2020   LEFT HEART CATH N/A 07/17/2014   Procedure: LEFT HEART CATH;  Surgeon: [name], MD;  Location: [facility] CATH LAB;  Service: Cardiovascular;  Laterality: N/A;   PERCUTANEOUS CORONARY STENT INTERVENTION (PCI-S)  07/17/2014   Procedure: PERCUTANEOUS CORONARY STENT INTERVENTION (PCI-S);  Surgeon: [name], MD;  Location: [facility] CATH LAB;  Service: Cardiovascular;;   RIGHT/LEFT HEART CATH AND CORONARY ANGIOGRAPHY N/A 10/03/2018   Procedure: RIGHT/LEFT HEART CATH AND CORONARY ANGIOGRAPHY;  Surgeon: Aundra Dubin,  Elby Showers, MD;  Location: [place] CV LAB;  Service: Cardiovascular;  Laterality: N/A;     Social History:   reports that he has been smoking cigarettes. He has a 7.50 pack-year smoking history. He has never used smokeless tobacco. He reports current alcohol use. He reports current drug use. Drugs: Cocaine and "Crack" cocaine.   Family History:  His family history includes Cirrhosis in his father; Heart attack in his brother, mother, and sister; Heart failure in his sister.   Allergies No Known Allergies   Home Medications  Prior to Admission medications   Medication Sig Start Date End Date Taking? Authorizing Provider  ASPIRIN LOW DOSE 81 MG EC tablet TAKE 1 TABLET (81 MG TOTAL) BY MOUTH DAILY (MORNING) 03/05/21   Bensimhon, Shaune Pascal, MD  atorvastatin (LIPITOR) 80 MG tablet Take 1 tablet (80 mg total) by mouth daily. 11/26/20   [name], MD  famotidine (PEPCID) 20 MG tablet Take 1 tablet (20 mg total) by mouth daily. 07/26/21   Rai, Vernelle Emerald, MD  gabapentin (NEURONTIN) 100 MG capsule Take 1 capsule (100 mg total) by mouth 2 (two) times daily. 05/21/21   Nita Sells, MD  JARDIANCE 10 MG TABS tablet TAKE 1 TABLET (10 MG TOTAL) BY MOUTH DAILY BEFORE BREAKFAST. 07/29/21   [name], MD  metoCLOPramide (REGLAN) 5 MG tablet Take 1 tablet (5 mg total) by mouth every 8 (eight) hours as needed for nausea or vomiting. 07/25/21 07/25/22  Rai, Vernelle Emerald, MD  metolazone (ZAROXOLYN) 2.5 MG tablet Take 1 tablet (2.5 mg total) by mouth once a week. On every Monday morning.  Take 30 minutes prior to taking AM dose of torsemide. 07/25/21   Rai, Vernelle Emerald, MD  Multiple Vitamins-Minerals (MULTIVITAMIN WITH MINERALS) tablet Take 1 tablet by mouth daily.    [provider]  nicotine (NICODERM CQ - DOSED IN MG/24 HR) 7 mg/24hr patch Place 1 patch (7 mg total) onto the skin daily. 05/22/21   Nita Sells, MD  pantoprazole (PROTONIX) 40 MG tablet TAKE 1 TABLET (40 MG TOTAL) BY MOUTH 2 (TWO) TIMES DAILY. 07/29/21 08/28/21  Ladell Pier, MD  polyethylene glycol (MIRALAX) 17 g packet Take 17 g by mouth daily as needed. 07/17/21   Owens Shark, NP  potassium chloride SA (KLOR-CON) 20 MEQ tablet Take 2 tablets (40 mEq total) by mouth daily. 01/20/21   [name], MD  PROAIR HFA 108 [id number] Base) MCG/ACT inhaler INHALE 2 PUFFS INTO THE LUNGS EVERY 4 (FOUR) HOURS AS NEEDED FOR WHEEZING OR SHORTNESS OF BREATH. 07/04/21   Mayers, Cari S, PA-C  senna-docusate (SENOKOT-S) 8.6-50 MG tablet Take 1 tablet by mouth 2 (two) times daily. For constipation 07/25/21   Rai, Ripudeep K, MD  Torsemide 40 MG TABS Take 80 mg by mouth 2 (two) times daily. 06/27/21   Rafael Bihari, FNP

## 2021-09-29 ENCOUNTER — Inpatient Hospital Stay (HOSPITAL_COMMUNITY): Payer: Medicaid Other

## 2021-09-29 DIAGNOSIS — E43 Unspecified severe protein-calorie malnutrition: Secondary | ICD-10-CM | POA: Diagnosis not present

## 2021-09-29 DIAGNOSIS — R570 Cardiogenic shock: Secondary | ICD-10-CM | POA: Diagnosis not present

## 2021-09-29 DIAGNOSIS — I509 Heart failure, unspecified: Secondary | ICD-10-CM | POA: Diagnosis not present

## 2021-09-29 DIAGNOSIS — N179 Acute kidney failure, unspecified: Secondary | ICD-10-CM | POA: Diagnosis not present

## 2021-09-29 LAB — CBC
HCT: 35.7 % — ABNORMAL LOW (ref 39.0–52.0)
HCT: 33.7 % — ABNORMAL LOW (ref 39.0–52.0)
Hemoglobin: 12 g/dL — ABNORMAL LOW (ref 13.0–17.0)
Hemoglobin: 11.9 g/dL — ABNORMAL LOW (ref 13.0–17.0)
MCH: 30.6 pg (ref 26.0–34.0)
MCH: 30.1 pg (ref 26.0–34.0)
MCHC: 33.6 g/dL (ref 30.0–36.0)
MCHC: 35.3 g/dL (ref 30.0–36.0)
MCV: 91.1 fL (ref 80.0–100.0)
MCV: 85.1 fL (ref 80.0–100.0)
Platelets: 45 10*3/uL — ABNORMAL LOW (ref 150–400)
Platelets: 42 10*3/uL — ABNORMAL LOW (ref 150–400)
RBC: 3.92 MIL/uL — ABNORMAL LOW (ref 4.22–5.81)
RBC: 3.96 MIL/uL — ABNORMAL LOW (ref 4.22–5.81)
RDW: 22.2 % — ABNORMAL HIGH (ref 11.5–15.5)
RDW: 21.2 % — ABNORMAL HIGH (ref 11.5–15.5)
WBC: 11.9 10*3/uL — ABNORMAL HIGH (ref 4.0–10.5)
WBC: 10.6 10*3/uL — ABNORMAL HIGH (ref 4.0–10.5)
nRBC: 1.7 % — ABNORMAL HIGH (ref 0.0–0.2)
nRBC: 2.5 % — ABNORMAL HIGH (ref 0.0–0.2)

## 2021-09-29 LAB — GLUCOSE, CAPILLARY
Glucose-Capillary: 101 mg/dL — ABNORMAL HIGH (ref 70–99)
Glucose-Capillary: 103 mg/dL — ABNORMAL HIGH (ref 70–99)
Glucose-Capillary: 117 mg/dL — ABNORMAL HIGH (ref 70–99)
Glucose-Capillary: 142 mg/dL — ABNORMAL HIGH (ref 70–99)
Glucose-Capillary: 157 mg/dL — ABNORMAL HIGH (ref 70–99)
Glucose-Capillary: 191 mg/dL — ABNORMAL HIGH (ref 70–99)
Glucose-Capillary: 198 mg/dL — ABNORMAL HIGH (ref 70–99)
Glucose-Capillary: 214 mg/dL — ABNORMAL HIGH (ref 70–99)
Glucose-Capillary: 318 mg/dL — ABNORMAL HIGH (ref 70–99)
Glucose-Capillary: 323 mg/dL — ABNORMAL HIGH (ref 70–99)
Glucose-Capillary: 70 mg/dL (ref 70–99)
Glucose-Capillary: 93 mg/dL (ref 70–99)
Glucose-Capillary: 99 mg/dL (ref 70–99)

## 2021-09-29 LAB — RENAL FUNCTION PANEL
Albumin: 2.4 g/dL — ABNORMAL LOW (ref 3.5–5.0)
Anion gap: 14 (ref 5–15)
BUN: 109 mg/dL — ABNORMAL HIGH (ref 6–20)
CO2: 24 mmol/L (ref 22–32)
Calcium: 7.7 mg/dL — ABNORMAL LOW (ref 8.9–10.3)
Chloride: 97 mmol/L — ABNORMAL LOW (ref 98–111)
Creatinine, Ser: 3.53 mg/dL — ABNORMAL HIGH (ref 0.61–1.24)
GFR, Estimated: 20 mL/min — ABNORMAL LOW (ref >60)
Glucose, Bld: 132 mg/dL — ABNORMAL HIGH (ref 70–99)
Phosphorus: 5.5 mg/dL — ABNORMAL HIGH (ref 2.5–4.6)
Potassium: 4.6 mmol/L (ref 3.5–5.1)
Sodium: 135 mmol/L (ref 135–145)

## 2021-09-29 LAB — BASIC METABOLIC PANEL
Anion gap: 20 — ABNORMAL HIGH (ref 5–15)
Anion gap: 17 — ABNORMAL HIGH (ref 5–15)
BUN: 123 mg/dL — ABNORMAL HIGH (ref 6–20)
BUN: 129 mg/dL — ABNORMAL HIGH (ref 6–20)
CO2: 19 mmol/L — ABNORMAL LOW (ref 22–32)
CO2: 19 mmol/L — ABNORMAL LOW (ref 22–32)
Calcium: 9.7 mg/dL (ref 8.9–10.3)
Calcium: 8.9 mg/dL (ref 8.9–10.3)
Calcium: 8.1 mg/dL — ABNORMAL LOW (ref 8.9–10.3)
Chloride: 103 mmol/L (ref 98–111)
Chloride: 95 mmol/L — ABNORMAL LOW (ref 98–111)
Creatinine, Ser: 4.07 mg/dL — ABNORMAL HIGH (ref 0.61–1.24)
Creatinine, Ser: 4.08 mg/dL — ABNORMAL HIGH (ref 0.61–1.24)
Creatinine, Ser: 4.17 mg/dL — ABNORMAL HIGH (ref 0.61–1.24)
GFR, Estimated: 16 mL/min — ABNORMAL LOW (ref >60)
GFR, Estimated: 16 mL/min — ABNORMAL LOW (ref >60)
GFR, Estimated: 16 mL/min — ABNORMAL LOW (ref >60)
Glucose, Bld: 240 mg/dL — ABNORMAL HIGH (ref 70–99)
Glucose, Bld: 313 mg/dL — ABNORMAL HIGH (ref 70–99)
Glucose, Bld: 332 mg/dL — ABNORMAL HIGH (ref 70–99)
Potassium: 6.8 mmol/L (ref 3.5–5.1)
Potassium: 6.8 mmol/L (ref 3.5–5.1)
Sodium: 142 mmol/L (ref 135–145)
Sodium: 131 mmol/L — ABNORMAL LOW (ref 135–145)

## 2021-09-29 LAB — COMPREHENSIVE METABOLIC PANEL
ALT: 64 U/L — ABNORMAL HIGH (ref 0–44)
AST: 128 U/L — ABNORMAL HIGH (ref 15–41)
Albumin: 2.4 g/dL — ABNORMAL LOW (ref 3.5–5.0)
Alkaline Phosphatase: 120 U/L (ref 38–126)
Anion gap: 10 (ref 5–15)
BUN: 91 mg/dL — ABNORMAL HIGH (ref 6–20)
CO2: 25 mmol/L (ref 22–32)
Calcium: 7.5 mg/dL — ABNORMAL LOW (ref 8.9–10.3)
Chloride: 101 mmol/L (ref 98–111)
Creatinine, Ser: 2.86 mg/dL — ABNORMAL HIGH (ref 0.61–1.24)
GFR, Estimated: 25 mL/min — ABNORMAL LOW (ref >60)
Glucose, Bld: 89 mg/dL (ref 70–99)
Potassium: 4.5 mmol/L (ref 3.5–5.1)
Sodium: 136 mmol/L (ref 135–145)
Total Bilirubin: 5.1 mg/dL — ABNORMAL HIGH (ref 0.3–1.2)
Total Protein: 5.7 g/dL — ABNORMAL LOW (ref 6.5–8.1)

## 2021-09-29 LAB — URINE CULTURE: Culture: NO GROWTH

## 2021-09-29 LAB — BLOOD GAS, ARTERIAL
Acid-Base Excess: 2.2 mmol/L — ABNORMAL HIGH (ref 0.0–2.0)
Acid-base deficit: 13.4 mmol/L — ABNORMAL HIGH (ref 0.0–2.0)
Bicarbonate: 13.5 mmol/L — ABNORMAL LOW (ref 20.0–28.0)
Bicarbonate: 24.5 mmol/L (ref 20.0–28.0)
FIO2: 50
O2 Saturation: 99 %
O2 Saturation: 99.9 %
Patient temperature: 35.8
Patient temperature: 98.6
pCO2 arterial: 33.7 mmHg (ref 32.0–48.0)
pCO2 arterial: 31.2 mmHg — ABNORMAL LOW (ref 32.0–48.0)
pH, Arterial: 7.218 — ABNORMAL LOW (ref 7.350–7.450)
pH, Arterial: 7.506 — ABNORMAL HIGH (ref 7.350–7.450)
pO2, Arterial: 190 mmHg — ABNORMAL HIGH (ref 83.0–108.0)
pO2, Arterial: 184 mmHg — ABNORMAL HIGH (ref 83.0–108.0)

## 2021-09-29 LAB — MAGNESIUM
Magnesium: 2.4 mg/dL (ref 1.7–2.4)
Magnesium: 2.6 mg/dL — ABNORMAL HIGH (ref 1.7–2.4)

## 2021-09-29 LAB — COOXEMETRY PANEL
Carboxyhemoglobin: 1.1 % (ref 0.5–1.5)
Carboxyhemoglobin: 1.2 % (ref 0.5–1.5)
Methemoglobin: 0.6 % (ref 0.0–1.5)
Methemoglobin: 0.5 % (ref 0.0–1.5)
O2 Saturation: 68.8 %
O2 Saturation: 77 %
Total hemoglobin: 11.6 g/dL — ABNORMAL LOW (ref 12.0–16.0)
Total hemoglobin: 11.7 g/dL — ABNORMAL LOW (ref 12.0–16.0)

## 2021-09-29 LAB — MRSA NEXT GEN BY PCR, NASAL: MRSA by PCR Next Gen: NOT DETECTED

## 2021-09-29 LAB — PHOSPHORUS: Phosphorus: 10 mg/dL — ABNORMAL HIGH (ref 2.5–4.6)

## 2021-09-29 LAB — LACTIC ACID, PLASMA
Lactic Acid, Venous: >9 mmol/L (ref 0.5–1.9)
Lactic Acid, Venous: >9 mmol/L (ref 0.5–1.9)

## 2021-09-29 LAB — POTASSIUM: Potassium: 5.2 mmol/L — ABNORMAL HIGH (ref 3.5–5.1)

## 2021-09-29 LAB — PROCALCITONIN: Procalcitonin: 23.28 ng/mL

## 2021-09-29 MED ORDER — HEPARIN SODIUM (PORCINE) 1000 UNIT/ML DIALYSIS
1000.0000 [IU] | INTRAMUSCULAR | Status: DC | PRN
  Administered 2021-09-29 – 2021-10-03 (×3): 3200 [IU] via INTRAVENOUS_CENTRAL
  Filled 2021-09-29 (×2): qty 6
  Filled 2021-09-29: qty 4
  Filled 2021-09-29 (×3): qty 6

## 2021-09-29 MED ORDER — NOREPINEPHRINE 16 MG/250ML-% IV SOLN
5.0000 ug/min | INTRAVENOUS | Status: DC
  Administered 2021-09-29: 40 ug/min via INTRAVENOUS
  Administered 2021-09-29: 41 ug/min via INTRAVENOUS
  Administered 2021-09-29 – 2021-09-30 (×4): 42 ug/min via INTRAVENOUS
  Administered 2021-10-01: 37 ug/min via INTRAVENOUS
  Administered 2021-10-01: 40 ug/min via INTRAVENOUS
  Administered 2021-10-01 – 2021-10-02 (×2): 37 ug/min via INTRAVENOUS
  Administered 2021-10-03: 24 ug/min via INTRAVENOUS
  Administered 2021-10-03 – 2021-10-04 (×2): 20 ug/min via INTRAVENOUS
  Filled 2021-09-29 (×14): qty 250

## 2021-09-29 MED ORDER — PANTOPRAZOLE SODIUM 40 MG IV SOLR
40.0000 mg | INTRAVENOUS | Status: DC
  Administered 2021-09-29 – 2021-10-06 (×8): 40 mg via INTRAVENOUS
  Filled 2021-09-29 (×8): qty 40

## 2021-09-29 MED ORDER — SODIUM BICARBONATE 8.4 % IV SOLN
50.0000 meq | Freq: Once | INTRAVENOUS | Status: AC
  Administered 2021-09-29: 50 meq via INTRAVENOUS
  Filled 2021-09-29: qty 50

## 2021-09-29 MED ORDER — FENTANYL CITRATE (PF) 100 MCG/2ML IJ SOLN
100.0000 ug | INTRAMUSCULAR | Status: DC | PRN
  Administered 2021-09-29 – 2021-10-05 (×15): 100 ug via INTRAVENOUS
  Filled 2021-09-29 (×10): qty 2

## 2021-09-29 MED ORDER — SODIUM CHLORIDE 0.9 % IV SOLN: INTRAVENOUS | Status: DC

## 2021-09-29 MED ORDER — SODIUM ZIRCONIUM CYCLOSILICATE 10 G PO PACK
10.0000 g | PACK | Freq: Once | ORAL | Status: AC
  Administered 2021-09-29: 10 g
  Filled 2021-09-29: qty 1

## 2021-09-29 MED ORDER — ORAL CARE MOUTH RINSE
15.0000 mL | OROMUCOSAL | Status: DC
  Administered 2021-09-29 – 2021-10-10 (×108): 15 mL via OROMUCOSAL

## 2021-09-29 MED ORDER — INSULIN ASPART 100 UNIT/ML IV SOLN: 10.0000 [IU] | Freq: Once | INTRAVENOUS | Status: DC

## 2021-09-29 MED ORDER — PRISMASOL BGK 4/2.5 32-4-2.5 MEQ/L REPLACEMENT SOLN: Status: DC

## 2021-09-29 MED ORDER — PRISMASOL BGK 4/2.5 32-4-2.5 MEQ/L EC SOLN
Status: DC
Start: 2021-09-29 — End: 2021-10-04

## 2021-09-29 MED ORDER — STERILE WATER FOR INJECTION IJ SOLN
INTRAMUSCULAR | Status: AC
  Filled 2021-09-29: qty 10

## 2021-09-29 MED ORDER — INSULIN ASPART 100 UNIT/ML IJ SOLN
0.0000 [IU] | INTRAMUSCULAR | Status: DC
  Administered 2021-09-29: 3 [IU] via SUBCUTANEOUS
  Administered 2021-09-29: 7 [IU] via SUBCUTANEOUS

## 2021-09-29 MED ORDER — PROPOFOL 1000 MG/100ML IV EMUL
5.0000 ug/kg/min | INTRAVENOUS | Status: DC
  Administered 2021-09-29: 10 ug/kg/min via INTRAVENOUS
  Administered 2021-09-29 – 2021-09-30 (×4): 30 ug/kg/min via INTRAVENOUS
  Administered 2021-09-30 – 2021-10-01 (×2): 10 ug/kg/min via INTRAVENOUS
  Filled 2021-09-29 (×7): qty 100

## 2021-09-29 MED ORDER — INSULIN REGULAR(HUMAN) IN NACL 100-0.9 UT/100ML-% IV SOLN
INTRAVENOUS | Status: DC
  Administered 2021-09-29: 4 [IU]/h via INTRAVENOUS
  Filled 2021-09-29: qty 100

## 2021-09-29 MED ORDER — CHLORHEXIDINE GLUCONATE 0.12% ORAL RINSE (MEDLINE KIT)
15.0000 mL | Freq: Two times a day (BID) | OROMUCOSAL | Status: DC
  Administered 2021-09-29 – 2021-10-10 (×24): 15 mL via OROMUCOSAL

## 2021-09-29 MED ORDER — DEXTROSE-NACL 5-0.9 % IV SOLN: INTRAVENOUS | Status: DC

## 2021-09-29 MED ORDER — PRISMASOL BGK 0/2.5 32-2.5 MEQ/L EC SOLN
Status: DC
  Filled 2021-09-29 (×7): qty 5000

## 2021-09-29 MED ORDER — HEPARIN SODIUM (PORCINE) 5000 UNIT/ML IJ SOLN: 5000.0000 [IU] | Freq: Three times a day (TID) | INTRAMUSCULAR | Status: DC

## 2021-09-29 MED ORDER — VASOPRESSIN 20 UNITS/100 ML INFUSION FOR SHOCK
0.0000 [IU]/min | INTRAVENOUS | Status: DC
  Administered 2021-09-29 – 2021-10-04 (×13): 0.03 [IU]/min via INTRAVENOUS
  Filled 2021-09-29 (×13): qty 100

## 2021-09-29 MED ORDER — PRISMASOL BGK 0/2.5 32-2.5 MEQ/L EC SOLN
Status: DC
  Filled 2021-09-29 (×2): qty 5000

## 2021-09-29 MED ORDER — INSULIN ASPART 100 UNIT/ML IJ SOLN: 2.0000 [IU] | INTRAMUSCULAR | Status: DC

## 2021-09-29 MED ORDER — PRISMASOL BGK 4/2.5 32-4-2.5 MEQ/L REPLACEMENT SOLN
Status: DC
Start: 2021-09-29 — End: 2021-10-04

## 2021-09-29 MED ORDER — ALTEPLASE 2 MG IJ SOLR
2.0000 mg | Freq: Once | INTRAMUSCULAR | Status: AC
  Administered 2021-09-29: 2 mg
  Filled 2021-09-29: qty 2

## 2021-09-29 MED ORDER — DEXTROSE 50 % IV SOLN: 0.0000 mL | INTRAVENOUS | Status: DC | PRN

## 2021-09-29 MED ORDER — INSULIN DETEMIR 100 UNIT/ML ~~LOC~~ SOLN
5.0000 [IU] | Freq: Two times a day (BID) | SUBCUTANEOUS | Status: DC
  Administered 2021-09-29: 5 [IU] via SUBCUTANEOUS
  Filled 2021-09-29 (×2): qty 0.05

## 2021-09-29 MED ORDER — CALCIUM GLUCONATE-NACL 1-0.675 GM/50ML-% IV SOLN
1.0000 g | Freq: Once | INTRAVENOUS | Status: AC
  Administered 2021-09-29: 1000 mg via INTRAVENOUS
  Filled 2021-09-29: qty 50

## 2021-09-29 MED ORDER — SODIUM CHLORIDE 0.9 % IV SOLN
2.0000 g | Freq: Two times a day (BID) | INTRAVENOUS | Status: DC
  Administered 2021-09-29 – 2021-09-30 (×2): 2 g via INTRAVENOUS
  Filled 2021-09-29 (×3): qty 2

## 2021-09-29 MED ORDER — HEPARIN SODIUM (PORCINE) 5000 UNIT/ML IJ SOLN
350.0000 [IU]/h | INTRAMUSCULAR | Status: DC
Start: 2021-09-29 — End: 2021-09-30
  Administered 2021-09-29: 350 [IU]/h via INTRAVENOUS_CENTRAL
  Filled 2021-09-29: qty 10000
  Filled 2021-09-29: qty 2

## 2021-09-29 NOTE — Progress Notes (Addendum)
eLink Physician-Brief Progress Note Patient Name: Tyler Wong DOB: 1966/11/01 MRN: [id number]   Date of Service  09/29/2021  HPI/Events of Note  34/J with systolic CHF EF 17%, CAD, DM, HTN, polysubstance abuse, brought in due to shortness of breath.  He was found to be hypotensive, with hyperkalemia of 6.4 --> 5.9.  Pt started on levophed, given fluids and Lasix.   Camera in the room is not working.  Labs reviewed.   eICU Interventions  Acute on chronic systolic CHF Hyperkalemia AKI on CKD Lactic acidosis Acute respiratory failure  Continue empiric antibiotics.  Continue levophed and dobutamine.  Continue HCO3.  Trend lactate. Continue to trend troponin.  May need CRRT if no improvement in the morning.      Intervention Category Evaluation Type: New Patient Evaluation  Elsie Lincoln 09/29/2021, 12:30 AM  12:49 AM Notified that patient has no gag, not responding to noxious stimuli.  Pt was given etomidate and rocuronium for intubation.   CT head with no acute intracranial abnormality taken 1 hour ago.   Plan> Continue to monitor for now. Suspect that given renal failure, anesthetic and paralytic are lingering.  Check ABG, CXR and KUB now to confirm tube placement.  Recheck lacate.  1:11 AM Insert foley cath for close monitoring of I&Os in setting of AKI.  1:22 AM Pt remains completely unresponsive at this time.   Plan> Repeat CT head.  2:02 AM Notified of glucose in the 130-150s.  ABG 7.218/33.06/1189 on TV 450, PEEP 5, RR 28, 50% FiO2. Pt is also waking up slowly now.  Plan> Change POC glucose to every 4 hours with insulin sliding scale.  Defer repeat CT head at this time. Titrate FiO2 down to 30%.   3:31 AM CXR shows ETT in adequate placement.   3:59 AM Pt is more alert, thrashing head side to side. Pt is getting fentanyl 168mcg q2hrs.   Plan> Start on propofol gtt.   6:48 AM Notified of K at 6.8.   Plan> Give medical management - insulin,  calcium gluconate, naHCo3, lokelma.  Spoke to Dr. Carolin Sicks and he will put in orders for CRRT.

## 2021-09-29 NOTE — Progress Notes (Signed)
ET tube advanced 2 cm per X-ray.  ET tube secured at 27cm at the lip.

## 2021-09-29 NOTE — TOC Initial Note (Signed)
Transition of Care [facility]) - Initial/Assessment Note    Patient Details  Name: Tyler Wong MRN: [id number] Date of Birth: [birth year]  [facility]) CM/SW Contact:    Dessa Phi, RN Phone Number: 09/29/2021, 12:37 PM  Clinical Narrative:  Will follow for d/c needs-per Jenn LCW provided w/past info about shelter/rehab-provided me with Mariann [employer] tel#-[employer] [phone].                 Expected Discharge Plan: Homeless Shelter Barriers to Discharge: Continued Medical Work up   Patient Goals and CMS Choice Patient states their goals for this hospitalization and ongoing recovery are:: shelter CMS Medicare.gov Compare Post Acute Care list provided to:: Patient    Expected Discharge Plan and Services Expected Discharge Plan: [place]   Discharge Planning Services: CM Consult   Living arrangements for the past 2 months: Homeless Shelter                                      Prior Living Arrangements/Services Living arrangements for the past 2 months: Homeless Shelter Lives with:: Self Patient language and need for interpreter reviewed:: Yes Do you feel safe going back to the place where you live?: Yes      Need for Family Participation in Patient Care: No (Comment) Care giver support system in place?: Yes (comment)   Criminal Activity/Legal Involvement Pertinent to Current Situation/Hospitalization: No - Comment as needed  Activities of Daily Living Home Assistive Devices/Equipment: Other (Comment) (UTA) ADL Screening (condition at time of admission) Patient's cognitive ability adequate to safely complete daily activities?: No Is the patient deaf or have difficulty hearing?: No (UTA) Does the patient have difficulty seeing, even when wearing glasses/contacts?: No (UTA) Does the patient have difficulty concentrating, remembering, or making decisions?: No (UTA) Patient able to express need for assistance with ADLs?: No  (UTA) Does the patient have difficulty dressing or bathing?: No (UTA) Independently performs ADLs?: No (UTA) Communication: Dependent Is this a change from baseline?: Change from baseline, expected to last >3 days Dressing (OT): Dependent Is this a change from baseline?: Change from baseline, expected to last >3 days Grooming: Dependent Is this a change from baseline?: Change from baseline, expected to last >3 days Feeding: Dependent Is this a change from baseline?: Change from baseline, expected to last >3 days Bathing: Dependent Is this a change from baseline?: Change from baseline, expected to last >3 days Toileting: Dependent Is this a change from baseline?: Change from baseline, expected to last >3days In/Out Bed: Dependent Is this a change from baseline?: Change from baseline, expected to last >3 days Walks in Home: Dependent Is this a change from baseline?: Change from baseline, expected to last >3 days Does the patient have difficulty walking or climbing stairs?: No (UTA) Weakness of Legs: None Weakness of Arms/Hands: None  Permission Sought/Granted Permission sought to share information with : Case Manager Permission granted to share information with : Yes, Verbal Permission Granted  Share Information with NAME: Case Manager           Emotional Assessment Appearance:: Appears stated age   Affect (typically observed): Accepting        Admission diagnosis:  Cardiogenic shock ([place]) [R57.0] Shock ([place]) [R57.9] AKI (acute kidney injury) ([place]) [N17.9] Acute on chronic congestive heart failure, unspecified heart failure type ([place]) [I50.9] Sepsis with acute renal failure and septic shock, due to unspecified  organism, unspecified acute renal failure type ([place]) [A41.9, R65.21, N17.9] Patient Active Problem List   Diagnosis Date Noted   Nicotine dependence, cigarettes, uncomplicated 37/62/8315   Type 2 diabetes mellitus with stage 3b chronic kidney disease, without  long-term current use of insulin ([place]) 07/23/2021   Hyperkalemia 06/18/2021   Lactic acidosis 06/18/2021   RUQ abdominal pain    Cocaine abuse ([place]) 06/17/2021   COPD (chronic obstructive pulmonary disease) ([place]) 06/17/2021   Esophageal candidiasis ([place]) 06/17/2021   Port-A-Cath in place 05/22/2021   Acute dyspnea    CHF exacerbation ([place]) 05/13/2021   Gastric cancer ([place]) 11/29/2020   Abdominal pain, generalized 11/05/2020   Congestive heart failure (CHF) ([place]) 11/01/2020   CHF (congestive heart failure) ([place]) 10/31/2020   Palliative care by specialist    Encounter for laboratory testing for COVID-19 virus    Acute systolic HF (heart failure) ([place]) 10/03/2020   Acute on chronic systolic CHF (congestive heart failure) (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   Acute on chronic clinical systolic heart failure ([place]) 06/22/2020   Acute on chronic combined systolic and diastolic CHF (congestive heart failure) ([place]) 06/22/2020   Palliative care encounter    Cardiogenic shock ([place]) 04/11/2020   CKD (chronic kidney disease) stage 3, GFR 30-59 ml/min (HCC) 04/11/2020   Symptomatic anemia 04/09/2020   Acute on chronic HFrEF (heart failure with reduced ejection fraction) ([place]) 05/05/2019   HFrEF (heart failure with reduced ejection fraction) ([place]) 05/04/2019   Acute combined systolic and diastolic congestive heart failure ([place]) 10/02/2018   Hepatitis C 04/13/2017   Current non-adherence to medical treatment 04/13/2017   Acute on chronic combined systolic (congestive) and diastolic (congestive) heart failure ([place]) 04/13/2017   HTN (hypertension) 04/12/2017   Insomnia 01/18/2017   Chronic combined systolic and diastolic congestive heart failure ([place]) 01/04/2017    Hyperlipidemia 01/04/2017   Chest pain 12/30/2016   Coronary artery disease involving native coronary artery of native heart without angina pectoris 07/18/2014   Tobacco abuse 07/17/2014   Cocaine use 07/17/2014   PCP:  [name], NP Pharmacy:   [place], [place] - [address] [place] [address] Phone: [phone] Fax: [phone]  [facility] [address] [place] [address] Phone: [phone] Fax: [place] [address] Phone: [phone] Fax: [place] [address] [place] [address] Phone: [phone] Fax: [phone]     Social Determinants of Health (SDOH) Interventions    Readmission Risk Interventions Readmission Risk Prevention Plan 06/20/2021 05/20/2021 11/04/2020  Transportation Screening Complete Complete Complete  Medication Review Press photographer) Complete - Complete  PCP or Specialist appointment within 3-5 days of discharge Complete Complete Complete  HRI or Home Care Consult Complete Complete Complete  SW Recovery Care/Counseling Consult Complete Complete Complete  Palliative Care Screening Not Applicable Not Applicable Not [place] Not Applicable Not Applicable Not Applicable  Some recent data might be hidden

## 2021-09-29 NOTE — Procedures (Addendum)
Central Venous Catheter Insertion Procedure Note  Tyler Wong  [id number]  1966-05-30  Date:09/29/21  Time:9:39 AM   Provider Performing:Hermen Mario Kary Kos, NP  Procedure: Insertion of Non-tunneled Central Venous [phone]) with US guidance ([id number])   Indication(s) Medication administration  Consent Risks of the procedure as well as the alternatives and risks of each were explained to the patient and/or caregiver.  Consent for the procedure was obtained and is signed in the bedside chart  Anesthesia Topical only with 1% lidocaine   Timeout Verified patient identification, verified procedure, site/side was marked, verified correct patient position, special equipment/implants available, medications/allergies/relevant history reviewed, required imaging and test results available.  Sterile Technique Maximal sterile technique including full sterile barrier drape, hand hygiene, sterile gown, sterile gloves, mask, hair covering, sterile ultrasound probe cover (if used).  Procedure Description Area of catheter insertion was cleaned with chlorhexidine and draped in sterile fashion.  With real-time ultrasound guidance a central venous catheter was placed into the right subclavian vein. Nonpulsatile blood flow and easy flushing noted in all ports.  The catheter was sutured in place and sterile dressing applied.  Complications/Tolerance None; patient tolerated the procedure well. Chest X-ray is ordered to verify placement for internal jugular or subclavian cannulation.   Chest x-ray is not ordered for femoral cannulation.  EBL Minimal  Specimen(s) None   I was scrubbed in. Instructing and directly assisting in the entire procedure  Erick Colace ACNP-BC [facility] Pager # [phone] OR # [phone] if no answer

## 2021-09-29 NOTE — Progress Notes (Signed)
[facility] Progress Note  Subjective: pt seen in ICU, pt is on the vent, sedated  Vitals:   09/29/21 1315 09/29/21 1345 09/29/21 1400 09/29/21 1500  BP: 94/73 100/75 97/70 93/62   Pulse: 97 96 95 (!) 102  Resp: (!) 9 (!) 0 (!) 28 (!) 28  Temp: (!) 97 F (36.1 C) (!) 97 F (36.1 C) (!) 96.8 F (36 C)   TempSrc:      SpO2: 100% 100% 100% 100%  Weight:      Height:        Exam: on vent ,sedated  no jvd  throat ett in place  Chest cta bilat and lat  Cor reg no RG  Abd soft ntnd no ascites   Ext diffuse 2+ pitting edema of UE's and LE's   Neuro on vent and sedated, not following commands    R fem HD cath and a-line in place     UA 10/02 - mod Hb, prot 100, 6-10 rbcs, 0-5 wbcs   Renal US 10/03 - 10 cm kidneys bilat w/o hydro, normal echotexture  CXR : IMPRESSION: 1. Interval placement of right subclavian central venous catheter with tip in the projection of the SVC. No pneumothorax. 2. Decreased aeration to the retrocardiac left lung base.     Assessment/ Plan: AKI on CKD stage 3-  presumably due to CHF exacerbation and cardiorenal syndrome.  UA unremarkable, renal US w/o signs of obstruction.  LVEF around 20% with multiple previous admission for low cardiac output heart failure. On levo/ vaso gtt's and IV dobutamine for cardiogenic shock. Poor UOP 200 cc yesterday. Started on CRRT this am. Will follow. Hyperkalemia - treating w/ medications and CRRT Acute on chronic systolic and diastolic CHF: Received a dose of Lasix IV.  Started CRRT, not able to UF much given low BP's  Anion gap metabolic acidosis - due to renal failure.  Lactic acid level pending.  Continue conservative management Substance abuse CAD sp STEMI Tobacco use HTN - in shock now COPD     Rob Gentle Hoge 09/29/2021, 3:47 PM   Recent Labs  Lab 10/21/2021 1955 09/29/21 0040 09/29/21 0520 09/29/21 0707 09/29/21 1252  K 5.9*  --  6.8* 6.8* 5.2*  BUN 111*  --  123* 129*  --   CREATININE  4.00*   < > 4.08* 4.17*  --   CALCIUM  --    < > 8.9 8.1*  --   PHOS  --   --  10.0*  --   --   HGB 14.6  --  12.0*  --   --    < > = values in this interval not displayed.   Inpatient medications:  calcium gluconate  1 g Intravenous Once   chlorhexidine gluconate (MEDLINE KIT)  15 mL Mouth Rinse BID   Chlorhexidine Gluconate Cloth  6 each Topical Daily   dextrose  50 mL Intravenous Once   etomidate  20 mg Intravenous Once   insulin aspart  10 Units Intravenous Once   insulin aspart  10 Units Intravenous Once   mouth rinse  15 mL Mouth Rinse 10 times per day   pantoprazole (PROTONIX) IV  40 mg Intravenous Q24H   sodium bicarbonate  150 mEq Intravenous Once   sodium chloride flush  10-40 mL Intracatheter Q12H   sodium zirconium cyclosilicate  10 g Per Tube TID   sterile water (preservative free)       vancomycin variable dose per unstable renal function (pharmacist dosing)  Does not apply See admin instructions    sodium chloride 10 mL/hr at 09/29/21 1500   sodium chloride Stopped (09/29/21 0956)   sodium chloride     sodium chloride     ceFEPime (MAXIPIME) IV     DOBUTamine 5 mcg/kg/min (09/29/21 1500)   heparin 10,000 units/ 20 mL infusion syringe 350 Units/hr (09/29/21 1239)   insulin 1.3 Units/hr (09/29/21 1500)   norepinephrine (LEVOPHED) Adult infusion 40 mcg/min (09/29/21 1500)   prismasol BGK 2/2.5 dialysis solution 1,500 mL/hr at 09/29/21 1233   prismasol BGK 2/2.5 replacement solution 500 mL/hr at 09/29/21 1236   prismasol BGK 2/2.5 replacement solution 500 mL/hr at 09/29/21 1234   propofol (DIPRIVAN) infusion 30 mcg/kg/min (09/29/21 1500)   sodium bicarbonate 150 mEq in D5W infusion 50 mL/hr at 09/29/21 1500   vasopressin 0.03 Units/min (09/29/21 1500)   dextrose, docusate, fentaNYL (SUBLIMAZE) injection, heparin, polyethylene glycol, sodium chloride flush

## 2021-09-29 NOTE — Procedures (Addendum)
Central Venous Catheter Insertion Procedure Note  Tyler Wong  [id number]  June 16, 1966  Date:09/29/21  Time:9:41 AM   Provider Performing:Laura Logan Bores   Procedure: Insertion of Non-tunneled Central Venous Catheter(36556)with US guidance ([id number])    Indication(s) Hemodialysis  Consent Risks of the procedure as well as the alternatives and risks of each were explained to the patient and/or caregiver.  Consent for the procedure was obtained and is signed in the bedside chart  Anesthesia Topical only with 1% lidocaine   Timeout Verified patient identification, verified procedure, site/side was marked, verified correct patient position, special equipment/implants available, medications/allergies/relevant history reviewed, required imaging and test results available.  Sterile Technique Maximal sterile technique including full sterile barrier drape, hand hygiene, sterile gown, sterile gloves, mask, hair covering, sterile ultrasound probe cover (if used).  Procedure Description Area of catheter insertion was cleaned with chlorhexidine and draped in sterile fashion.   With real-time ultrasound guidance a HD catheter was placed into the right femoral vein.  Nonpulsatile blood flow and easy flushing noted in all ports.  The catheter was sutured in place and sterile dressing applied.  Complications/Tolerance None; patient tolerated the procedure well. Chest X-ray is ordered to verify placement for internal jugular or subclavian cannulation.  Chest x-ray is not ordered for femoral cannulation.  EBL Minimal  Specimen(s) None   I was present, scrubbed in and physically instructed and assisted in entire procedure

## 2021-09-29 NOTE — Procedures (Addendum)
Arterial Catheter Insertion Procedure Note  Diamond Martucci  [id number]  1966/10/23  Date:09/29/21  Time:9:42 AM    Provider Performing: Dorothe Pea    Procedure: Insertion of Arterial Line [phone]) with US guidance ([id number])   Indication(s) Blood pressure monitoring and/or need for frequent ABGs  Consent Risks of the procedure as well as the alternatives and risks of each were explained to the patient and/or caregiver.  Consent for the procedure was obtained and is signed in the bedside chart  Anesthesia None   Time Out Verified patient identification, verified procedure, site/side was marked, verified correct patient position, special equipment/implants available, medications/allergies/relevant history reviewed, required imaging and test results available.   Sterile Technique Maximal sterile technique including full sterile barrier drape, hand hygiene, sterile gown, sterile gloves, mask, hair covering, sterile ultrasound probe cover (if used).   Procedure Description Area of catheter insertion was cleaned with chlorhexidine and draped in sterile fashion. With real-time ultrasound guidance an arterial catheter was placed into the right femoral artery.  Appropriate arterial tracings confirmed on monitor.     Complications/Tolerance None; patient tolerated the procedure well.   EBL Minimal   Specimen(s) None   I was scrubbed in, instructing and directly assisted with placement   Erick Colace ACNP-BC [place] Pager # [phone] OR # [phone] if no answer

## 2021-09-29 NOTE — Progress Notes (Signed)
[facility] Progress Note Patient Name: Tyler Wong DOB: May 19, 1966 MRN: [id number]   Date of Service  09/29/2021  HPI/Events of Note  Notified of hypoglycemia with glucose at 70.  Pt was on HCO3 gtt earlier which has since been stopped.  eICU Interventions  Start on D5NS @ 50cc/hr.     Intervention Category Minor Interventions: Other:  Elsie Lincoln 09/29/2021, 11:58 PM

## 2021-09-29 NOTE — Progress Notes (Signed)
Tyler Wong Sister [phone]    [name]   [phone]  Several attempts made to call both numbers and even text message our number through the patient's cell phone with no response at this time.

## 2021-09-29 NOTE — H&P (Addendum)
NAME:  Tyler Wong, MRN:  [id number], DOB:  [birth year], LOS: 1 ADMISSION DATE:  10/07/2021, CONSULTATION DATE: 10/21/2021 REFERRING MD:  , CHIEF COMPLAINT: Shortness of breath  History of Present Illness:  55 year old male with history of Gastric cancer, HTN, DM type II, CAD, HFrEF (20%), Polysubstance abuse CKD stage 3b, arrived via EMS due to shortness of breath he was found to be hypothermic and hypoglycemic he was sitting in the rain in the cold for a few hour. On arrival: hypotensive with systolic of 73/71 with labs showing acute on chronic kidney disease with potassium of 5.9.  Patient was acidotic with pH of 7.27 and bicarb of 8.  Initial exam: agitated tachypneic he was on Levophed 40 mics and is being treated for hyperkalemia.  Patient was evaluated by nephrology and will continue medical management and possible need for CRRT if no improvement.  Patient was agitated very hard to get any history from.  Pertinent  Medical History  -Gastric cancer;  CR w/ Chemo. Lost to follow up  -Hypertension -Diabetes mellitus -CAD -Congestive heart failure ejection fraction 20% -Cocaine and polysubstance abuse -Diabetes mellitus   10/2 admitted. Placed on pressors for shock and dobutamine for presumed cardiogenic shock.  Bicarb administered as well as IVF bolus. Cultures sent, empirically placed on vanc and cefepime. Intubated for airway protection. Nephro consulted  10/3 hyperkalemic overnight.  Treated with bicarbonate and Lasix, refractory shock over the evening.  Potassium up to 6.8, creatinine holding around 4, anion gap metabolic acidosis persisting With lactate greater than 9 arterial blood gas pH 7.2, PCO2 33, oxygenation 190 with bicarbonate calculated at 13.5, urgent dialysis catheter being placed in addition to central access for CVP monitoring and coox monitoring, in addition to arterial line.  Renal ultrasound with no evidence of hydronephrosis.CT abdomen/pelvis diffuse colonic wall  thickening suggesting colitis no free air or bowel obstruction moderate small amount of ascites body wall edema small effusion stable cardiomegaly.  CT head negative with exception of left nasal bone fracture and left zygomatic arch fracture with subcutaneous edema appear subacute given significant callus formation  Objective   Blood pressure 96/65, pulse (Abnormal) 118, temperature 98.4 F (36.9 C), resp. rate (Abnormal) 28, height 5\' 11"  (1.803 m), weight 69.9 kg, SpO2 100 %.    Vent Mode: PRVC FiO2 (%):  [40 %-100 %] 50 % Set Rate:  [20 bmp-30 bmp] 28 bmp Vt Set:  [450 mL] 450 mL PEEP:  [5 cmH20] 5 cmH20 Plateau Pressure:  [13 cmH20-15 cmH20] 15 cmH20  No intake or output data in the 24 hours ending 09/29/21 0730 Filed Weights   10/23/2021 1734 09/29/21 0500  Weight: 70.8 kg 69.9 kg    Examination:  General: This is a chronically ill-appearing 55 year old male he is currently on multiple pressors HEENT temporal wasting noted: Orally intubated.  Mucous membranes moist Pulmonary: Clear to auscultation currently on full ventilatory support Cardiac: Regular rate and rhythm Abdomen: Soft nontender Extremities: Cool, pulses palpable Neuro: Heavily sedated GU: Minimal urine output.  Assessment & Plan:   Cardiogenic shock (POA) w/ progressive Multiple Organ Failure  cannot rule out septic shock  Plan Place central line to obtain central venous pressure Obtain cooximetry, in addition to CVP monitoring this may be helpful to titrate inotropic Continue to titrate norepinephrine for mean arterial pressure greater than 65 Aiming for even volume status Normalize acidosis with CRRT Check cooximetry Follow-up pending cultures Continue empiric vancomycin and cefepime day #2 Continue telemetry monitoring Continue low-dose baby aspirin, holding  atorvastatin Hold BiDil, Entresto, metolazone, spironolactone and Demadex in context of shock  Acute respiratory failure present on admission  secondary to end organ hypoperfusion and profound metabolic acidosis Portable chest x-ray personally reviewed demonstrating endotracheal tube in satisfactory position cardiomegaly, no overt pulmonary edema Reviewed arterial blood gas, currently optimized from ventilatory status oxygenation acceptable Plan Full vent support  Will need to repeat ABG later today as we correct his metabolic status will not need as high of a Ve Am CXR VAP bundle  PAD protocol RASS goal -2  Acute on chronic renal failure (CKD stage 3b) Plan Start CRRT Strict I&O Serial labs  Severe lactic acidosis is setting of end-organ hypoperfusion w/ life threatening hyperkalemia Plan Initiate CRRT Cont to rx shock Serial abgs, lactates   Probable bowel ischemia/ischemic colitis in the setting of organ hypoperfusion and profound lactic acidosis, further supported by CT abdomen findings Plan Supportive care Serial lactates  Acute metabolic encephalopathy in setting of acidosis, shock, also could be element of toxic encephalopathy due to polysubstance abuse +/- wd Plan Pad protocol Supportive care  DM type 2 w/ hyperglycemia Plan Hyperglycemia protocol   Mild coagulopathy in the setting of shock Plan Repeat INR  Best Practice (right click and "Reselect all SmartList Selections" daily)   Diet/type: tubefeeds DVT prophylaxis: SCD GI prophylaxis: PPI Lines: Central line and Arterial Line Foley:  Yes, and it is still needed Code Status:  full code Last date of multidisciplinary goals of care discussion [will discuss goals of care w/ family ]    My ccm time 45 min    Erick Colace ACNP-BC [place] Pager # [phone] OR # [phone] if no answer

## 2021-09-29 NOTE — Progress Notes (Signed)
Inpatient Diabetes Program Recommendations  AACE/ADA: New Consensus Statement on Inpatient Glycemic Control (2015)  Target Ranges:  Prepandial:   less than 140 mg/dL      Peak postprandial:   less than 180 mg/dL (1-2 hours)      Critically ill patients:  140 - 180 mg/dL   Lab Results  Component Value Date   GLUCAP 323 (H) 09/29/2021   HGBA1C 6.8 (H) 07/23/2021   Results for OZAN, MACLAY (MRN [id number]) as of 09/29/2021 10:34  Ref. Range 10/03/2021 22:00 09/29/2021 00:17 09/29/2021 03:07 09/29/2021 08:05  Glucose-Capillary Latest Ref Range: 70 - 99 mg/dL 136 (H) 157 (H) 214 (H) 323 (H)   Review of Glycemic Control Cardiogenic Shock Diabetes history: DM 2 Outpatient Diabetes medications: Jardiance 10 mg Daily Current orders for Inpatient glycemic control:  Novolog 0-9 units Q4 hours  A1c 6.8% on 7/27  Inpatient Diabetes Program Recommendations:    - consider IV insulin due to perfusion and poor absorption on SQ tissue in a critical state  May also consider adding Levemir 8 units bid if IV insulin not approp.  Thanks,  Tama Headings RN, MSN, BC-ADM Inpatient Diabetes Coordinator Team Pager [phone] (8a-5p)

## 2021-09-29 NOTE — Progress Notes (Signed)
Pt transported to CT on VENT without incident.

## 2021-09-29 NOTE — Plan of Care (Signed)
Discussed in front of patient plan of care, pain management and sedation needs with no evidence of learning at this time. (Intubated)  Problem: Education: Goal: Knowledge of General Education information will improve Description: Including pain rating scale, medication(s)/side effects and non-pharmacologic comfort measures Outcome: Not Progressing   Problem: Pain Managment: Goal: General experience of comfort will improve Outcome: Not Progressing

## 2021-09-29 NOTE — Progress Notes (Signed)
Pt transported to ICU on VENT without complication.   

## 2021-09-29 NOTE — Progress Notes (Signed)
Nephrology follow up: K 6.8,  Lactic acidosis No improvement in renal function.  Plan: Medical management ordered by [facility] CRRT, all 2 K bath, fixed dosed heparin and UF as tolerated by BP. HD catheter placement by icu team. Follow closely.  Discussed with ICU.  Katheran James, MD Nephrology

## 2021-09-29 NOTE — Progress Notes (Signed)
Initial Nutrition Assessment  DOCUMENTATION CODES:   Severe malnutrition in context of chronic illness  INTERVENTION:   Once stable recommend initiate enteral feeds  Vital 1.5 @ 20 ml/hr  Increase by 10 ml every 8 hours to goal rate of 50 ml/hr (1200 ml/day) 90 ml ProSource TF QID  At goal provides: 2120 kcal, 169 grams protein, and 912 ml free water.    NUTRITION DIAGNOSIS:   Severe Malnutrition related to chronic illness (cancer) as evidenced by severe fat depletion, severe muscle depletion, edema.  GOAL:   Patient will meet greater than or equal to 90% of their needs  MONITOR:   Vent status  REASON FOR ASSESSMENT:   Ventilator, Malnutrition Screening Tool    ASSESSMENT:   Pt with PMH of gastric cancer s/p chemo, HTN, DM, CAD, CHF, polysubstance abuse, and CKD stage 3b who was found hypothermic and hypoglycemic sitting in the rain for hours admitted with cardiogenic shock.   Discussed with RN at bedside and NP. Insulin drip and CRRT being started today.  Per NP, no TF for now as pt likely has ischemic gut.   10/3 CT abd diffuse colonic wall thickening suggesting colitis, small amount of ascites, L nasal bone fx and L zygomatic arch fx   Patient is currently intubated on ventilator support MV: 12 L/min Temp (24hrs), Avg:97.5 F (36.4 C), Min:94.5 F (34.7 C), Max:98.4 F (36.9 C) MAP: >63  Propofol: 8.5 ml/hr provides: 224 kcal   Medications reviewed and include: SSI, protonix, lokelma Dobutamine @ 5 mcg  Levophed @ 40 mcg Nabicarb   Labs reviewed: Na 131, K: 6.8, BUN: 129, Cr: 4.17 CBG's: 323    NUTRITION - FOCUSED PHYSICAL EXAM:  Flowsheet Row Most Recent Value  Orbital Region Severe depletion  Upper Arm Region Severe depletion  Thoracic and Lumbar Region Unable to assess  [edema]  Buccal Region Unable to assess  Temple Region Severe depletion  Clavicle Bone Region Severe depletion  Clavicle and Acromion Bone Region Severe depletion   Scapular Bone Region Severe depletion  Dorsal Hand Unable to assess  Patellar Region Moderate depletion  Anterior Thigh Region Moderate depletion  Posterior Calf Region Unable to assess  [edema]  Edema (RD Assessment) Severe  [BLE, ascites]  Hair Reviewed  Eyes Unable to assess  Mouth Unable to assess  Skin Reviewed  Nails Unable to assess       Diet Order:   Diet Order             Diet NPO time specified  Diet effective now                   EDUCATION NEEDS:   Not appropriate for education at this time  Skin:  Skin Assessment: Reviewed RN Assessment (R foot non-pressure wound)  Last BM:  10/2  Height:   Ht Readings from Last 1 Encounters:  10/14/2021 5\' 11"  (1.803 m)    Weight:   Wt Readings from Last 1 Encounters:  09/29/21 73.5 kg    BMI:  Body mass index is 22.6 kg/m.  Estimated Nutritional Needs:   Kcal:  2000-2200  Protein:  139-174 grams  Fluid:  >2 L/day  Lockie Pares., RD, LDN, CNSC See AMiON for contact information

## 2021-09-29 NOTE — Progress Notes (Signed)
ABG collected and send down to lab. Called lab.

## 2021-09-29 NOTE — Progress Notes (Signed)
PHARMACY NOTE:  ANTIMICROBIAL RENAL DOSAGE ADJUSTMENT  Current antimicrobial regimen includes a mismatch between antimicrobial dosage and estimated renal function.  As per policy approved by the Pharmacy & Therapeutics and Medical Executive Committees, the antimicrobial dosage will be adjusted accordingly.  Current antimicrobial dosage:  cefepime 2g IV q24 hours  Indication: sepsis  Renal Function:  Estimated Creatinine Clearance: 20.8 mL/min (A) (by C-G formula based on SCr of 4.17 mg/dL (H)). []      On intermittent HD, scheduled: [x]      On CRRT    Antimicrobial dosage has been changed to:  cefepime 2g IV q12 hours  Additional comments:   Thank you for allowing pharmacy to be a part of this patient's care.  Dimple Nanas, PharmD 09/29/2021 12:58 PM

## 2021-09-30 ENCOUNTER — Inpatient Hospital Stay (HOSPITAL_COMMUNITY): Payer: Medicaid Other

## 2021-09-30 DIAGNOSIS — R579 Shock, unspecified: Secondary | ICD-10-CM

## 2021-09-30 DIAGNOSIS — J811 Chronic pulmonary edema: Secondary | ICD-10-CM

## 2021-09-30 DIAGNOSIS — E43 Unspecified severe protein-calorie malnutrition: Secondary | ICD-10-CM | POA: Diagnosis not present

## 2021-09-30 DIAGNOSIS — J810 Acute pulmonary edema: Secondary | ICD-10-CM

## 2021-09-30 DIAGNOSIS — R570 Cardiogenic shock: Secondary | ICD-10-CM | POA: Diagnosis not present

## 2021-09-30 DIAGNOSIS — I509 Heart failure, unspecified: Secondary | ICD-10-CM | POA: Diagnosis not present

## 2021-09-30 DIAGNOSIS — R609 Edema, unspecified: Secondary | ICD-10-CM

## 2021-09-30 DIAGNOSIS — N179 Acute kidney failure, unspecified: Secondary | ICD-10-CM | POA: Diagnosis not present

## 2021-09-30 LAB — BLOOD GAS, ARTERIAL
Acid-base deficit: 0.1 mmol/L (ref 0.0–2.0)
Bicarbonate: 23.2 mmol/L (ref 20.0–28.0)
FIO2: 40
MECHVT: 450 mL
O2 Saturation: 99.5 %
PEEP: 5 cmH2O
Patient temperature: 98.6
RATE: 20 resp/min
pCO2 arterial: 34.9 mmHg (ref 32.0–48.0)
pH, Arterial: 7.438 (ref 7.350–7.450)
pO2, Arterial: 180 mmHg — ABNORMAL HIGH (ref 83.0–108.0)

## 2021-09-30 LAB — MAGNESIUM
Magnesium: 2.6 mg/dL — ABNORMAL HIGH (ref 1.7–2.4)
Magnesium: 2.5 mg/dL — ABNORMAL HIGH (ref 1.7–2.4)
Magnesium: 2.5 mg/dL — ABNORMAL HIGH (ref 1.7–2.4)

## 2021-09-30 LAB — DIC (DISSEMINATED INTRAVASCULAR COAGULATION)PANEL
D-Dimer, Quant: >20 ug/mL-FEU — ABNORMAL HIGH (ref 0.00–0.50)
Fibrinogen: 115 mg/dL — ABNORMAL LOW (ref 210–475)
INR: 2.3 — ABNORMAL HIGH (ref 0.8–1.2)
Platelets: 26 10*3/uL — CL (ref 150–400)
Prothrombin Time: 25.3 seconds — ABNORMAL HIGH (ref 11.4–15.2)
aPTT: 60 seconds — ABNORMAL HIGH (ref 24–36)

## 2021-09-30 LAB — RENAL FUNCTION PANEL
Albumin: 2.3 g/dL — ABNORMAL LOW (ref 3.5–5.0)
Albumin: 2.3 g/dL — ABNORMAL LOW (ref 3.5–5.0)
Anion gap: 8 (ref 5–15)
Anion gap: 10 (ref 5–15)
BUN: 64 mg/dL — ABNORMAL HIGH (ref 6–20)
BUN: 46 mg/dL — ABNORMAL HIGH (ref 6–20)
CO2: 23 mmol/L (ref 22–32)
CO2: 23 mmol/L (ref 22–32)
Calcium: 7.8 mg/dL — ABNORMAL LOW (ref 8.9–10.3)
Calcium: 7.8 mg/dL — ABNORMAL LOW (ref 8.9–10.3)
Chloride: 107 mmol/L (ref 98–111)
Chloride: 103 mmol/L (ref 98–111)
Creatinine, Ser: 2.25 mg/dL — ABNORMAL HIGH (ref 0.61–1.24)
Creatinine, Ser: 1.83 mg/dL — ABNORMAL HIGH (ref 0.61–1.24)
GFR, Estimated: 34 mL/min — ABNORMAL LOW (ref >60)
GFR, Estimated: 43 mL/min — ABNORMAL LOW (ref >60)
Glucose, Bld: 84 mg/dL (ref 70–99)
Glucose, Bld: 143 mg/dL — ABNORMAL HIGH (ref 70–99)
Phosphorus: 3 mg/dL (ref 2.5–4.6)
Phosphorus: 3.8 mg/dL (ref 2.5–4.6)
Potassium: 4.5 mmol/L (ref 3.5–5.1)
Potassium: 4.9 mmol/L (ref 3.5–5.1)
Sodium: 138 mmol/L (ref 135–145)
Sodium: 136 mmol/L (ref 135–145)

## 2021-09-30 LAB — VANCOMYCIN, RANDOM: Vancomycin Rm: 11

## 2021-09-30 LAB — GLUCOSE, CAPILLARY
Glucose-Capillary: 77 mg/dL (ref 70–99)
Glucose-Capillary: 79 mg/dL (ref 70–99)
Glucose-Capillary: 86 mg/dL (ref 70–99)
Glucose-Capillary: 83 mg/dL (ref 70–99)
Glucose-Capillary: 138 mg/dL — ABNORMAL HIGH (ref 70–99)
Glucose-Capillary: 94 mg/dL (ref 70–99)
Glucose-Capillary: 136 mg/dL — ABNORMAL HIGH (ref 70–99)

## 2021-09-30 LAB — PHOSPHORUS
Phosphorus: 2.9 mg/dL (ref 2.5–4.6)
Phosphorus: 3.7 mg/dL (ref 2.5–4.6)

## 2021-09-30 LAB — APTT: aPTT: 52 seconds — ABNORMAL HIGH (ref 24–36)

## 2021-09-30 LAB — PROCALCITONIN: Procalcitonin: 11.71 ng/mL

## 2021-09-30 LAB — CORTISOL: Cortisol, Plasma: >100 ug/dL

## 2021-09-30 LAB — LACTIC ACID, PLASMA: Lactic Acid, Venous: 1.9 mmol/L (ref 0.5–1.9)

## 2021-09-30 LAB — TRIGLYCERIDES: Triglycerides: 56 mg/dL (ref <150)

## 2021-09-30 MED ORDER — PIPERACILLIN-TAZOBACTAM 3.375 G IVPB: 3.3750 g | Freq: Four times a day (QID) | INTRAVENOUS | Status: DC

## 2021-09-30 MED ORDER — VITAL 1.5 CAL PO LIQD
1000.0000 mL | ORAL | Status: DC
  Administered 2021-09-30 – 2021-10-07 (×8): 1000 mL
  Filled 2021-09-30 (×9): qty 1000

## 2021-09-30 MED ORDER — INSULIN ASPART 100 UNIT/ML IJ SOLN
0.0000 [IU] | INTRAMUSCULAR | Status: DC
Start: 2021-09-30 — End: 2021-10-10
  Administered 2021-10-01: 1 [IU] via SUBCUTANEOUS
  Administered 2021-10-01 (×2): 2 [IU] via SUBCUTANEOUS
  Administered 2021-10-01: 1 [IU] via SUBCUTANEOUS
  Administered 2021-10-01: 2 [IU] via SUBCUTANEOUS
  Administered 2021-10-02 (×4): 1 [IU] via SUBCUTANEOUS
  Administered 2021-10-02: 2 [IU] via SUBCUTANEOUS
  Administered 2021-10-02: 1 [IU] via SUBCUTANEOUS
  Administered 2021-10-03: 2 [IU] via SUBCUTANEOUS
  Administered 2021-10-03: 1 [IU] via SUBCUTANEOUS
  Administered 2021-10-03: 2 [IU] via SUBCUTANEOUS
  Administered 2021-10-03 (×3): 1 [IU] via SUBCUTANEOUS
  Administered 2021-10-04: 2 [IU] via SUBCUTANEOUS
  Administered 2021-10-04: 1 [IU] via SUBCUTANEOUS
  Administered 2021-10-04: 2 [IU] via SUBCUTANEOUS
  Administered 2021-10-04 – 2021-10-05 (×3): 1 [IU] via SUBCUTANEOUS
  Administered 2021-10-05: 2 [IU] via SUBCUTANEOUS
  Administered 2021-10-05 – 2021-10-07 (×10): 1 [IU] via SUBCUTANEOUS
  Administered 2021-10-07: 2 [IU] via SUBCUTANEOUS
  Administered 2021-10-07 (×2): 1 [IU] via SUBCUTANEOUS
  Administered 2021-10-07: 3 [IU] via SUBCUTANEOUS
  Administered 2021-10-08 (×3): 1 [IU] via SUBCUTANEOUS
  Administered 2021-10-08: 2 [IU] via SUBCUTANEOUS
  Administered 2021-10-09 (×5): 1 [IU] via SUBCUTANEOUS

## 2021-09-30 MED ORDER — PROSOURCE TF PO LIQD
90.0000 mL | Freq: Four times a day (QID) | ORAL | Status: DC
  Administered 2021-09-30 – 2021-10-07 (×28): 90 mL
  Filled 2021-09-30 (×29): qty 90

## 2021-09-30 MED ORDER — PROSOURCE TF PO LIQD
45.0000 mL | Freq: Two times a day (BID) | ORAL | Status: DC
  Administered 2021-09-30: 45 mL
  Filled 2021-09-30: qty 45

## 2021-09-30 MED ORDER — HYDROCORTISONE SOD SUC (PF) 100 MG IJ SOLR
100.0000 mg | Freq: Two times a day (BID) | INTRAMUSCULAR | Status: DC
  Administered 2021-09-30 – 2021-10-06 (×13): 100 mg via INTRAVENOUS
  Filled 2021-09-30 (×14): qty 2

## 2021-09-30 MED ORDER — PIPERACILLIN-TAZOBACTAM 3.375 G IVPB 30 MIN
3.3750 g | Freq: Four times a day (QID) | INTRAVENOUS | Status: DC
  Administered 2021-09-30 – 2021-10-01 (×5): 3.375 g via INTRAVENOUS
  Filled 2021-09-30 (×11): qty 50

## 2021-09-30 MED ORDER — VITAL HIGH PROTEIN PO LIQD: 1000.0000 mL | ORAL | Status: DC

## 2021-09-30 NOTE — Progress Notes (Signed)
Bilateral lower extremity venous duplex has been completed. Preliminary results can be found in CV Proc through chart review.   09/30/21 2:17 PM Carlos Levering RVT

## 2021-09-30 NOTE — Progress Notes (Signed)
Nutrition Follow-up  DOCUMENTATION CODES:   Severe malnutrition in context of chronic illness  INTERVENTION:   Once stable recommend initiate enteral feeds  Vital 1.5 @ 20 ml/hr  Increase by 10 ml every 8 hours to goal rate of 50 ml/hr (1200 ml/day) 90 ml ProSource TF QID  At goal provides: 2120 kcal, 169 grams protein, and 912 ml free water.   Propofol providing additional kcal from lipid   NUTRITION DIAGNOSIS:   Severe Malnutrition related to chronic illness (cancer) as evidenced by severe fat depletion, severe muscle depletion, edema. Ongoing.   GOAL:   Patient will meet greater than or equal to 90% of their needs Progressing with initiation of enteral nutrition   MONITOR:   Vent status  REASON FOR ASSESSMENT:   Ventilator, Malnutrition Screening Tool    ASSESSMENT:   Pt with PMH of gastric cancer s/p chemo, HTN, DM, CAD, CHF, polysubstance abuse, and CKD stage 3b who was found hypothermic and hypoglycemic sitting in the rain for hours admitted with cardiogenic shock.   On admission concern for ischemic gut, lactate improved and MAP stable with current pressors. Consult to start TF via protocol. Vital High Protein ordered for 40 ml/hr. Adjusted to better meet needs and to advance more slowly.   10/3 CT abd diffuse colonic wall thickening suggesting colitis, small amount of ascites, L nasal bone fx and L zygomatic arch fx; started CRRT 10/4 TF started per protocol; RD adjusted for slower advancement   Patient is currently intubated on ventilator support MV: 8.8 L/min Temp (24hrs), Avg:98.5 F (36.9 C), Min:97.5 F (36.4 C), Max:99 F (37.2 C) MAP: >67 fiO2: 40%  Propofol: 4 ml/hr provides: 105 kcal   Medications reviewed and include: solu-cortef, SSI, protonix D5NS @ 50 ml/hr  Dobutamine @ 5 mcg > 7.5 mcg  Levophed @ 40 mcg > 42 mcg  Vasopressin added @ .03 units    Labs reviewed: BUN: 129 > 64, Cr: 4.17 > 2.25 CBG's: 323 >> 82-83 now off insulin drip     UOP: 291 ml  OG tube; tip in the body of the stomach output 100 ml   Diet Order:   Diet Order             Diet NPO time specified  Diet effective now                   EDUCATION NEEDS:   Not appropriate for education at this time  Skin:  Skin Assessment: Reviewed RN Assessment (R foot non-pressure wound)  Last BM:  10/2 (PTA smear)  Height:   Ht Readings from Last 1 Encounters:  10/27/2021 5\' 11"  (1.803 m)    Weight:   Wt Readings from Last 1 Encounters:  09/30/21 72.2 kg    BMI:  Body mass index is 22.2 kg/m.  Estimated Nutritional Needs:   Kcal:  2000-2200  Protein:  139-174 grams  Fluid:  >2 L/day  Lockie Pares., RD, LDN, CNSC See AMiON for contact information

## 2021-09-30 NOTE — Progress Notes (Signed)
[facility] Progress Note  Subjective: pt seen in ICU, pt is on the vent, sedated  Vitals:   09/30/21 1345 09/30/21 1400 09/30/21 1415 09/30/21 1430  BP:      Pulse: (!) 107 (!) 105 (!) 110 (!) 109  Resp: (!) 23 19 (!) 22 (!) 22  Temp: 98.6 F (37 C) 98.6 F (37 C)    TempSrc:      SpO2: 100% 100% 100% 100%  Weight:      Height:        Exam: on vent ,sedated  no jvd  throat ett in place  Chest cta bilat and lat  Cor reg no RG  Abd soft ntnd no ascites   Ext diffuse 2+ pitting edema of UE's and LE's   Neuro on vent and sedated, not following commands    R fem HD cath and a-line in place     UA 10/02 - mod Hb, prot 100, 6-10 rbcs, 0-5 wbcs   Renal US 10/03 - 10 cm kidneys bilat w/o hydro, normal echotexture  CXR : IMPRESSION: 1. Interval placement of right subclavian central venous catheter with tip in the projection of the SVC. No pneumothorax. 2. Decreased aeration to the retrocardiac left lung base   Assessment/ Plan: AKI on CKD stage 3-  b/l creatinine is 1.42- 1.92 from July 2022, egfr 41-58 ml/min. AKI presumably due to CHF exacerbation and cardiorenal syndrome.  UA unremarkable. [place] w/o signs of obstruction.  LVEF around 20% with multiple previous admissions for low cardiac output heart failure. On levo/ vaso gtt's and IV dobutamine due to shock. Low UOP 100- 200 cc daily. Started CRRT on 10/3. Continue for now.  Hyperkalemia - improved Acute on chronic systolic and diastolic CHF - Started CRRT, not able to UF much given low BP's and pressor support Anion gap metabolic acidosis - due to renal failure, resolved  Substance abuse CAD sp STEMI Tobacco use COPD     Tyler Wong 09/30/2021, 3:05 PM   Recent Labs  Lab 09/29/21 0520 09/29/21 0707 09/29/21 2032 09/30/21 0542 09/30/21 1031  K 6.8*   < > 4.5 4.5  --   BUN 123*   < > 91* 64*  --   CREATININE 4.08*   < > 2.86* 2.25*  --   CALCIUM 8.9   < > 7.5* 7.8*  --   PHOS 10.0*   < >  --  3.0  2.9  HGB 12.0*  --  11.9*  --   --    < > = values in this interval not displayed.    Inpatient medications:  calcium gluconate  1 g Intravenous Once   chlorhexidine gluconate (MEDLINE KIT)  15 mL Mouth Rinse BID   Chlorhexidine Gluconate Cloth  6 each Topical Daily   dextrose  50 mL Intravenous Once   etomidate  20 mg Intravenous Once   feeding supplement (PROSource TF)  45 mL Per Tube BID   hydrocortisone sod succinate (SOLU-CORTEF) inj  100 mg Intravenous Q12H   insulin aspart  0-6 Units Subcutaneous Q4H   mouth rinse  15 mL Mouth Rinse 10 times per day   pantoprazole (PROTONIX) IV  40 mg Intravenous Q24H   sodium bicarbonate  150 mEq Intravenous Once   sodium chloride flush  10-40 mL Intracatheter Q12H     prismasol BGK 4/2.5 500 mL/hr at 09/30/21 1458    prismasol BGK 4/2.5 500 mL/hr at 09/30/21 1458   sodium chloride Stopped (09/30/21 1204)  sodium chloride     dextrose 5 % and 0.9% NaCl 50 mL/hr at 09/30/21 1300   DOBUTamine 7.5 mcg/kg/min (09/30/21 1300)   feeding supplement (VITAL 1.5 CAL)     heparin 10,000 units/ 20 mL infusion syringe 350 Units/hr (09/30/21 1458)   norepinephrine (LEVOPHED) Adult infusion 42 mcg/min (09/30/21 1400)   piperacillin-tazobactam Stopped (09/30/21 1134)   prismasol BGK 4/2.5 1,500 mL/hr at 09/30/21 1457   propofol (DIPRIVAN) infusion 10 mcg/kg/min (09/30/21 1300)   vasopressin 0.03 Units/min (09/30/21 1300)   docusate, fentaNYL (SUBLIMAZE) injection, heparin, polyethylene glycol, sodium chloride flush

## 2021-09-30 NOTE — Progress Notes (Addendum)
NAME:  Tyler Wong, MRN:  [id number], DOB:  [birth year], LOS: 2 ADMISSION DATE:  10/09/2021, CONSULTATION DATE:  10/12/2021 REFERRING MD:  , CHIEF COMPLAINT:  SOB   History of Present Illness:  55 year old male with history of Gastric cancer, HTN, DM type II, CAD, HFrEF (20%), Polysubstance abuse CKD stage 3b, arrived via EMS due to shortness of breath he was found to be hypothermic and hypoglycemic he was sitting in the rain in the cold for a few hour. On arrival: hypotensive with systolic of 50/93 with labs showing acute on chronic kidney disease with potassium of 5.9.  Patient was acidotic with pH of 7.27 and bicarb of 8.  Initial exam: agitated tachypneic he was on Levophed 40 mics and is being treated for hyperkalemia.  Patient was evaluated by nephrology and will continue medical management and possible need for CRRT if no improvement.  Patient was agitated very hard to get any history from.  Pertinent  Medical History  -Gastric cancer;  CR w/ Chemo. Lost to follow up  -Hypertension -Diabetes mellitus -CAD -Congestive heart failure ejection fraction 20% -Cocaine and polysubstance abuse -Diabetes mellitus  Significant Hospital Events: Including procedures, antibiotic start and stop dates in addition to other pertinent events   10/2: admit to inpatient 10/3: vanc and cefepime started. CVC, A line, and HD catheters placed. Start CRRT. Vasopressors x2 in addition to inotrope support  Goals of care discussed with family. Remains Full code per patient's wishes with family members PTA. 10/4: Continue CRRT, vasopressors x2 still high dose. Dobutamine at 7.5; lactic acid down to 1.9   Interim History / Subjective:  Pt. Intubated, sedated. Unable to obtain information at this time.  Objective   Blood pressure 104/75, pulse (!) 111, temperature 98.6 F (37 C), temperature source Bladder, resp. rate (!) 28, height 5\' 11"  (1.803 m), weight 72.2 kg, SpO2 100 %. CVP:  [10 mmHg-19 mmHg] 12 mmHg   Vent Mode: PRVC FiO2 (%):  [40 %] 40 % Set Rate:  [28 bmp] 28 bmp Vt Set:  [450 mL] 450 mL PEEP:  [5 cmH20] 5 cmH20 Plateau Pressure:  [14 cmH20-18 cmH20] 18 cmH20   Intake/Output Summary (Last 24 hours) at 09/30/2021 0816 Last data filed at 09/30/2021 0806 Gross per 24 hour  Intake 3321.92 ml  Output 3116.8 ml  Net 205.12 ml   Filed Weights   09/29/21 0500 09/29/21 1115 09/30/21 0727  Weight: 69.9 kg 73.5 kg 72.2 kg    Examination: General: ill-appearing HENT: temporal wasting noted. ETT in place. Lungs: rhonchi, friction rub Cardiovascular: S3, S4 present Abdomen: soft, non-tender Extremities: cool, pulses palpable.  Neuro: sedated, RASS -5. GU: decreased UOP. Foley in place.  Wendelyn Kiesling exam  General sedated on prop. Still on high dose pressors Hent NCAT no JVD orally intubated.  Pulm diffuse rhonchi. Vent mechanics improving. Is having frothy output Card holosystolic HM  Abd soft not tender  Ext still cool  Neuro heavily sedated Gu fc in place w/ min out put  Resolved Hospital Problem list   Hyperglycemia Lactic acidosis (resolved 10/4) Life threatening hyperkalemia (resolved 10/4) Assessment & Plan:  Cardiogenic shock (POA) w/ progressive Multiple Organ Failure  cannot rule out septic shock (procalcitonin was elevated: inclined to think may be bacterial translocation from gut) Remains on 2 vasopressors & high dose inotrope, Blood Cultures: No growth on day 1.  Urine Culture: negative. CVP: 12 Lactate cleared so appears like we are meeting his goals  Plan: CVP monitoring; aim to keep euvolemic  Cont to titrate norepi for MAP > 65; hold vasopressin at current Trying to pull fluid w/ CRRT Cont tele  F/u cultures  Changing abx to mono-coverage w/ zosyn  Cont baby asa Holding Bidil, entresto, metolazine, spironolactone in setting of shock  Trend pct  Acute respiratory failure present on admission secondary to end organ hypoperfusion and profound metabolic  acidosis Currently stable and optimized from ventilatory status oxygenation acceptable. plan: Cont full vent Full vent support  Decrease ve now that we have corrected acid base need to decrease VE VAP bundle Volume removal as able  PAD protocol RASS goal -2    Acute on chronic renal failure (CKD stage 3b) Cr down trending: 2.25 (down from 4.17 on 10/3) K normalizing: 4.5 (down form 6.8 on 10/3) Plan: CRRT Strict I&O Serial labs  Probable bowel ischemia/ischemic colitis in the setting of organ hypoperfusion and profound lactic acidosis, further supported by CT abdomen findings-->lactate cleared so this is reassuring  Plan: Start trickle feeds Am lactate   Acute metabolic encephalopathy in setting of acidosis, shock, also could be element of toxic encephalopathy due to polysubstance abuse +/- wd Remains medically sedated.  Plan: RASS goal -2 he is over current goal  Cont prop but dec dosing  PRN fent    DM type 2 w/ hyperglycemia v. hypoglycemia Insulin gtt completed overnight. Hypoglycemic overnight (10/3 @ 2344) BSG @70 . Treated overnight with D5NS@ 62ml/hr.  Held AM Novolog insulin Plan: Will change to very sens scale    Mild coagulopathy in the setting of shock No active signs of bleeding INR pending Plan: Repeat INR Monitor for S/Sx of bleeding  Best Practice (right click and "Reselect all SmartList Selections" daily)   Diet/type: tubefeeds DVT prophylaxis: SCD GI prophylaxis: PPI Lines: Central line, Dialysis Catheter, and Arterial Line Foley:  Yes, and it is still needed Code Status:  full code Last date of multidisciplinary goals of care discussion Aletha Halim continue to discuss with family]  Critical care time: 39 min     Erick Colace ACNP-BC [place] Pager # [phone] OR # [phone] if no answer

## 2021-10-01 DIAGNOSIS — J9600 Acute respiratory failure, unspecified whether with hypoxia or hypercapnia: Secondary | ICD-10-CM | POA: Diagnosis not present

## 2021-10-01 DIAGNOSIS — R579 Shock, unspecified: Secondary | ICD-10-CM | POA: Diagnosis not present

## 2021-10-01 LAB — RENAL FUNCTION PANEL
Albumin: 2.4 g/dL — ABNORMAL LOW (ref 3.5–5.0)
Albumin: 2.2 g/dL — ABNORMAL LOW (ref 3.5–5.0)
Anion gap: 12 (ref 5–15)
Anion gap: 8 (ref 5–15)
BUN: 40 mg/dL — ABNORMAL HIGH (ref 6–20)
BUN: 40 mg/dL — ABNORMAL HIGH (ref 6–20)
CO2: 19 mmol/L — ABNORMAL LOW (ref 22–32)
CO2: 25 mmol/L (ref 22–32)
Calcium: 8.3 mg/dL — ABNORMAL LOW (ref 8.9–10.3)
Calcium: 8.4 mg/dL — ABNORMAL LOW (ref 8.9–10.3)
Chloride: 104 mmol/L (ref 98–111)
Chloride: 109 mmol/L (ref 98–111)
Creatinine, Ser: 1.36 mg/dL — ABNORMAL HIGH (ref 0.61–1.24)
Creatinine, Ser: 1.2 mg/dL (ref 0.61–1.24)
GFR, Estimated: >60 mL/min (ref >60)
GFR, Estimated: >60 mL/min (ref >60)
Glucose, Bld: 201 mg/dL — ABNORMAL HIGH (ref 70–99)
Glucose, Bld: 244 mg/dL — ABNORMAL HIGH (ref 70–99)
Phosphorus: 4.4 mg/dL (ref 2.5–4.6)
Phosphorus: 3.2 mg/dL (ref 2.5–4.6)
Potassium: 4.9 mmol/L (ref 3.5–5.1)
Potassium: 4.8 mmol/L (ref 3.5–5.1)
Sodium: 135 mmol/L (ref 135–145)
Sodium: 142 mmol/L (ref 135–145)

## 2021-10-01 LAB — GLUCOSE, CAPILLARY
Glucose-Capillary: 178 mg/dL — ABNORMAL HIGH (ref 70–99)
Glucose-Capillary: 193 mg/dL — ABNORMAL HIGH (ref 70–99)
Glucose-Capillary: 197 mg/dL — ABNORMAL HIGH (ref 70–99)
Glucose-Capillary: 70 mg/dL (ref 70–99)
Glucose-Capillary: 214 mg/dL — ABNORMAL HIGH (ref 70–99)
Glucose-Capillary: 229 mg/dL — ABNORMAL HIGH (ref 70–99)
Glucose-Capillary: 233 mg/dL — ABNORMAL HIGH (ref 70–99)

## 2021-10-01 LAB — CBC
HCT: 36.3 % — ABNORMAL LOW (ref 39.0–52.0)
HCT: 35.2 % — ABNORMAL LOW (ref 39.0–52.0)
HCT: 34.9 % — ABNORMAL LOW (ref 39.0–52.0)
Hemoglobin: 12.3 g/dL — ABNORMAL LOW (ref 13.0–17.0)
Hemoglobin: 11.9 g/dL — ABNORMAL LOW (ref 13.0–17.0)
Hemoglobin: 12.1 g/dL — ABNORMAL LOW (ref 13.0–17.0)
MCH: 30.1 pg (ref 26.0–34.0)
MCH: 30.1 pg (ref 26.0–34.0)
MCH: 30.4 pg (ref 26.0–34.0)
MCHC: 33.9 g/dL (ref 30.0–36.0)
MCHC: 33.8 g/dL (ref 30.0–36.0)
MCHC: 34.7 g/dL (ref 30.0–36.0)
MCV: 88.8 fL (ref 80.0–100.0)
MCV: 88.9 fL (ref 80.0–100.0)
MCV: 87.7 fL (ref 80.0–100.0)
Platelets: 19 10*3/uL — CL (ref 150–400)
Platelets: 17 10*3/uL — CL (ref 150–400)
Platelets: 16 10*3/uL — CL (ref 150–400)
RBC: 4.09 MIL/uL — ABNORMAL LOW (ref 4.22–5.81)
RBC: 3.96 MIL/uL — ABNORMAL LOW (ref 4.22–5.81)
RBC: 3.98 MIL/uL — ABNORMAL LOW (ref 4.22–5.81)
RDW: 22.4 % — ABNORMAL HIGH (ref 11.5–15.5)
RDW: 22.2 % — ABNORMAL HIGH (ref 11.5–15.5)
RDW: 21.8 % — ABNORMAL HIGH (ref 11.5–15.5)
WBC: 10.7 10*3/uL — ABNORMAL HIGH (ref 4.0–10.5)
WBC: 15.1 10*3/uL — ABNORMAL HIGH (ref 4.0–10.5)
WBC: 15 10*3/uL — ABNORMAL HIGH (ref 4.0–10.5)
nRBC: 1.5 % — ABNORMAL HIGH (ref 0.0–0.2)
nRBC: 1.2 % — ABNORMAL HIGH (ref 0.0–0.2)
nRBC: 0.9 % — ABNORMAL HIGH (ref 0.0–0.2)

## 2021-10-01 LAB — HEPARIN INDUCED PLATELET AB (HIT ANTIBODY): Heparin Induced Plt Ab: 0.075 OD (ref 0.000–0.400)

## 2021-10-01 LAB — PHOSPHORUS
Phosphorus: 4.6 mg/dL (ref 2.5–4.6)
Phosphorus: 3.3 mg/dL (ref 2.5–4.6)

## 2021-10-01 LAB — COOXEMETRY PANEL
Carboxyhemoglobin: 1 % (ref 0.5–1.5)
Carboxyhemoglobin: 0.9 % (ref 0.5–1.5)
Carboxyhemoglobin: 1.5 % (ref 0.5–1.5)
Methemoglobin: 0.7 % (ref 0.0–1.5)
Methemoglobin: 0.8 % (ref 0.0–1.5)
Methemoglobin: 1.1 % (ref 0.0–1.5)
O2 Saturation: 63.4 %
O2 Saturation: 62.9 %
O2 Saturation: 68.8 %
Total hemoglobin: 12.5 g/dL (ref 12.0–16.0)
Total hemoglobin: 12.3 g/dL (ref 12.0–16.0)
Total hemoglobin: 12.2 g/dL (ref 12.0–16.0)

## 2021-10-01 LAB — MAGNESIUM
Magnesium: 2.5 mg/dL — ABNORMAL HIGH (ref 1.7–2.4)
Magnesium: 2.7 mg/dL — ABNORMAL HIGH (ref 1.7–2.4)

## 2021-10-01 LAB — LACTIC ACID, PLASMA
Lactic Acid, Venous: 3 mmol/L (ref 0.5–1.9)
Lactic Acid, Venous: 1.9 mmol/L (ref 0.5–1.9)

## 2021-10-01 LAB — PROCALCITONIN: Procalcitonin: 6.18 ng/mL

## 2021-10-01 LAB — APTT: aPTT: 41 seconds — ABNORMAL HIGH (ref 24–36)

## 2021-10-01 MED ORDER — SODIUM CHLORIDE 0.9 % IV SOLN
1.0000 g | Freq: Three times a day (TID) | INTRAVENOUS | Status: DC
  Administered 2021-10-01 – 2021-10-04 (×8): 1 g via INTRAVENOUS
  Filled 2021-10-01 (×9): qty 1

## 2021-10-01 MED ORDER — LACTATED RINGERS IV BOLUS
500.0000 mL | Freq: Once | INTRAVENOUS | Status: AC
  Administered 2021-10-01: 500 mL via INTRAVENOUS

## 2021-10-01 MED ORDER — POLYETHYLENE GLYCOL 3350 17 G PO PACK
17.0000 g | PACK | Freq: Every day | ORAL | Status: DC
  Administered 2021-10-01 – 2021-10-10 (×3): 17 g
  Filled 2021-10-01 (×5): qty 1

## 2021-10-01 MED ORDER — DOCUSATE SODIUM 50 MG/5ML PO LIQD
100.0000 mg | Freq: Two times a day (BID) | ORAL | Status: DC
  Administered 2021-10-01 – 2021-10-10 (×5): 100 mg
  Filled 2021-10-01 (×9): qty 10

## 2021-10-01 MED ORDER — PHYTONADIONE 5 MG PO TABS
10.0000 mg | ORAL_TABLET | Freq: Once | ORAL | Status: AC
  Administered 2021-10-01: 10 mg
  Filled 2021-10-01: qty 2

## 2021-10-01 MED ORDER — DOBUTAMINE IN D5W 4-5 MG/ML-% IV SOLN
2.5000 ug/kg/min | INTRAVENOUS | Status: DC
  Administered 2021-10-01 – 2021-10-02 (×2): 10 ug/kg/min via INTRAVENOUS
  Administered 2021-10-03: 7.5 ug/kg/min via INTRAVENOUS
  Administered 2021-10-06: 2.5 ug/kg/min via INTRAVENOUS
  Filled 2021-10-01 (×4): qty 250

## 2021-10-01 NOTE — Progress Notes (Addendum)
Inpatient Diabetes Program Recommendations  AACE/ADA: New Consensus Statement on Inpatient Glycemic Control   Target Ranges:  Prepandial:   less than 140 mg/dL      Peak postprandial:   less than 180 mg/dL (1-2 hours)      Critically ill patients:  140 - 180 mg/dL   Results for Tyler Wong, Tyler Wong (MRN [id number]) as of 10/01/2021 12:47  Ref. Range 10/01/2021 03:30 10/01/2021 07:32 10/01/2021 07:53 10/01/2021 07:58 10/01/2021 11:50  Glucose-Capillary Latest Ref Range: 70 - 99 mg/dL 178 (H) 70 193 (H) 197 (H) 214 (H)   Review of Glycemic Control  Diabetes history: DM2 Outpatient Diabetes medications: Jardiance 10 mg daily Current orders for Inpatient glycemic control: Novolog 0-6 units Q4H; Solucortef 100 mg Q12H, Vital @ 50 ml/hr  Inpatient Diabetes Program Recommendations:    Insulin-Tube Feeding: Noted tube feeding ordered yesterday afternoon and current rate is 40 ml/hr (goal of 50 ml/hr) and glucose elevated since tube feedings started.  Please consider ordering Novolog 2 units Q4H for tube feeding coverage. If tube feeding is stopped or held then Novolog tube feeding coverage should also be stopped or held.  Thanks, Barnie Alderman, RN, MSN, CDE Diabetes Coordinator Inpatient Diabetes Program [phone] (Team Pager from 8am to 5pm)

## 2021-10-01 NOTE — Progress Notes (Signed)
[facility] Progress Note  Subjective: pt seen in ICU, pt is on the vent, sedated. I/O yesterday 1.1 L net negative.   Vitals:   10/01/21 1300 10/01/21 1400 10/01/21 1500 10/01/21 1512  BP: (!) 87/65  94/66 94/65  Pulse: (!) 107 (!) 106 98 98  Resp: 17 (!) 24 (!) 22 20  Temp: 98.4 F (36.9 C) 98.8 F (37.1 C) 98.6 F (37 C)   TempSrc:      SpO2: 100% 99% 98% 99%  Weight:      Height:        Exam: on vent ,sedated  no jvd  throat ett in place  Chest cta bilat and lat  Cor reg no RG  Abd soft ntnd no ascites   Ext diffuse mild-mod pitting edema of UE's and LE's   Neuro on vent and sedated, not following commands    R fem HD cath and a-line in place     UA 10/02 - mod Hb, prot 100, 6-10 rbcs, 0-5 wbcs   Renal US 10/03 - 10 cm kidneys bilat w/o hydro, normal echotexture  CXR : IMPRESSION: 1. Interval placement of right subclavian central venous catheter with tip in the projection of the SVC. No pneumothorax. 2. Decreased aeration to the retrocardiac left lung base   Assessment/ Plan: AKI on CKD stage 3-  b/l creatinine is 1.42- 1.92 from July 2022, egfr 41-58 ml/min. AKI presumably due to CHF exacerbation and cardiorenal syndrome.  UA unremarkable. [place] w/o signs of obstruction.  LVEF around 20% with multiple previous admissions for low cardiac output heart failure. On levo/ vaso gtt's and IV dobutamine due to shock. Low UOP dropping off w/ CRRT. Started CRRT on 10/3. Continue for now.  Acute on chronic systolic and diastolic CHF - Started CRRT, not able to pull much fluid. Per CCM request will change to keep even today, or possibly +50 cc/hr.  Cardiogenic shock - pt remains on dobutamine and 2 pressors, prognosis guarded.  Anion gap metabolic acidosis - resolved  Substance abuse CAD sp STEMI Tobacco use COPD     Rob Gautam Langhorst 10/01/2021, 3:46 PM   Recent Labs  Lab 09/29/21 2032 09/30/21 0542 09/30/21 1706 10/01/21 0510 10/01/21 1212  K 4.5   < > 4.9  4.9  --   BUN 91*   < > 46* 40*  --   CREATININE 2.86*   < > 1.83* 1.36*  --   CALCIUM 7.5*   < > 7.8* 8.3*  --   PHOS  --    < > 3.8  3.7 4.6  4.4  --   HGB 11.9*  --   --   --  12.3*   < > = values in this interval not displayed.    Inpatient medications:  calcium gluconate  1 g Intravenous Once   chlorhexidine gluconate (MEDLINE KIT)  15 mL Mouth Rinse BID   Chlorhexidine Gluconate Cloth  6 each Topical Daily   dextrose  50 mL Intravenous Once   docusate  100 mg Per Tube BID   etomidate  20 mg Intravenous Once   feeding supplement (PROSource TF)  90 mL Per Tube QID   hydrocortisone sod succinate (SOLU-CORTEF) inj  100 mg Intravenous Q12H   insulin aspart  0-6 Units Subcutaneous Q4H   mouth rinse  15 mL Mouth Rinse 10 times per day   pantoprazole (PROTONIX) IV  40 mg Intravenous Q24H   polyethylene glycol  17 g Per Tube Daily  sodium bicarbonate  150 mEq Intravenous Once   sodium chloride flush  10-40 mL Intracatheter Q12H     prismasol BGK 4/2.5 500 mL/hr at 10/01/21 1052    prismasol BGK 4/2.5 500 mL/hr at 10/01/21 1052   sodium chloride 10 mL/hr at 10/01/21 1500   sodium chloride     DOBUTamine     feeding supplement (VITAL 1.5 CAL) 40 mL/hr at 10/01/21 0800   norepinephrine (LEVOPHED) Adult infusion 46 mcg/min (10/01/21 1500)   piperacillin-tazobactam Stopped (10/01/21 1229)   prismasol BGK 4/2.5 1,200 mL/hr at 10/01/21 1501   vasopressin 0.03 Units/min (10/01/21 1500)   fentaNYL (SUBLIMAZE) injection, heparin, sodium chloride flush

## 2021-10-01 NOTE — Progress Notes (Signed)
Platelets still dropping  Pressor req worse Will change zosyn to meropenem   Erick Colace ACNP-BC [place] Pager # [phone] OR # [phone] if no answer

## 2021-10-01 NOTE — Progress Notes (Signed)
NAME:  Tyler Wong, MRN:  [id number], DOB:  31-Mar-1966, LOS: 3 ADMISSION DATE:  09/27/2021, CONSULTATION DATE:  10/25/2021 REFERRING MD:  , CHIEF COMPLAINT:  SOB   History of Present Illness:  54 year old male with history of Gastric cancer, HTN, DM type II, CAD, HFrEF (20%), Polysubstance abuse CKD stage 3b, arrived via EMS due to shortness of breath he was found to be hypothermic and hypoglycemic he was sitting in the rain in the cold for a few hour. On arrival: hypotensive with systolic of 57/84 with labs showing acute on chronic kidney disease with potassium of 5.9.  Patient was acidotic with pH of 7.27 and bicarb of 8.  Initial exam: agitated tachypneic he was on Levophed 40 mics and is being treated for hyperkalemia.  Patient was evaluated by nephrology and will continue medical management and possible need for CRRT if no improvement.  Patient was agitated very hard to get any history from.  Pertinent  Medical History  -Gastric cancer;  CR w/ Chemo. Lost to follow up  -Hypertension -Diabetes mellitus -CAD -Congestive heart failure ejection fraction 20% -Cocaine and polysubstance abuse -Diabetes mellitus  Significant Hospital Events: Including procedures, antibiotic start and stop dates in addition to other pertinent events   10/2: admit to inpatient 10/3: vanc and cefepime started. CVC, A line, and HD catheters placed. Start CRRT. Vasopressors x2 in addition to inotrope support  Goals of care discussed with family. Remains Full code per patient's wishes with family members PTA. 10/4: Continue CRRT, vasopressors x2 still high dose. Dobutamine at 7.5; lactic acid down to 1.9  10/5: wean off propofol. Continue CRRT. Co-ox dropping. Cvp and exam suggest euvolemic so changed volume goal to +50 cc an hour  Interim History / Subjective:  Remains sedated.  We have just turned off propofol. Objective   Blood pressure 91/62, pulse 99, temperature 98.6 F (37 C), temperature source Bladder,  resp. rate (!) 26, height 5\' 11"  (1.803 m), weight 74.1 kg, SpO2 99 %. CVP:  [9 mmHg-22 mmHg] 9 mmHg  Vent Mode: PRVC FiO2 (%):  [30 %-40 %] 30 % Set Rate:  [20 bmp] 20 bmp Vt Set:  [450 mL] 450 mL PEEP:  [5 cmH20] 5 cmH20 Plateau Pressure:  [13 cmH20-16 cmH20] 16 cmH20   Intake/Output Summary (Last 24 hours) at 10/01/2021 0920 Last data filed at 10/01/2021 0900 Gross per 24 hour  Intake 2913.54 ml  Output 4371 ml  Net -1457.46 ml    Filed Weights   09/29/21 1115 09/30/21 0727 10/01/21 0500  Weight: 73.5 kg 72.2 kg 74.1 kg    Examination: General: Chronically ill-appearing and acutely ill-appearing 55 year old male remains on full ventilator support Pulmonary: Diminished bases no accessory use decreasing FiO2 and PEEP requirements HEENT normocephalic atraumatic does have some temporal wasting orally intubated Cardiac: Holosystolic murmur, regular rate with frequent ectopy Abdomen: Soft, liquid stool from Flexi-Seal Extremities: Cool, pulses present. Neuro very heavily sedated, will cough, moving a little, starting to open his eyes GU: Foley catheter with minimal urine output currently on CRRT  Resolved Hospital Problem list   Hyperglycemia Lactic acidosis (resolved 10/4) Life threatening hyperkalemia (resolved 10/4)  Assessment & Plan:  Cardiogenic shock (POA) w/ progressive Multiple Organ Failure  cannot rule out septic shock (procalcitonin was elevated: inclined to think may be bacterial translocation from gut) Remains on 2 vasopressors & high dose inotrope, Blood Cultures: No growth on day 2.  CVP: 13 Lactate slightly increased to 3 (1.9 on 10/4), cooximetry is dropped some  Plan Continuing to keep him euvolemic, have spoke to nephrology, give him 50 cc net positive an hour today Continue to wean norepinephrine for mean arterial pressure greater than 65, continuing shock dose vasopressin Will hold off on titration of dobutamine given it seems volume status probably a  little low now Control acid-base with CRRT Continuing empiric antibiotics with Zosyn Discontinue aspirin given thrombocytopenia Holding BiDil, Entresto, metolazone, spironolactone all in setting of shock Continue to follow-up cultures We will repeat echo oximetry later this afternoon given volume goal changes We will continue stress dose steroids for now, suspect the cortisol of greater than 100 reflects lab draw after hydrocortisone administered  Acute respiratory failure present on admission secondary to end organ hypoperfusion and profound metabolic acidosis Currently stable and optimized from ventilatory status oxygenation acceptable. Arterial blood gas reviewed.  We had made some changes in minute ventilation on 10/4, Plan Continue full ventilator support, Will decrease minute ventilation further to support spontaneous ventilation efforts Daily assessment for weaning VAP bundle PAD protocol changing RASS goal to -2 A.m. chest x-ray Pulse oximetry  Acute on chronic renal failure (CKD stage 3b) Cr down trending: 1.36 (down from 2.25 on 10/4) K normalizing: 4.9 (stable) Plan: Continuing CRRT as directed by nephrology Volume goal as mentioned above Strict intake output Serial labs   Probable bowel ischemia/ischemic colitis in the setting of organ hypoperfusion and profound lactic acidosis, further supported by CT abdomen findings-->lactate cleared on 10/4, increased again 10/5 to 3. Will continue to follow.  Plan: Continue trickle feeds Repeat lactate this afternoon and in a.m.  Acute metabolic encephalopathy in setting of acidosis, shock, also could be element of toxic encephalopathy due to polysubstance abuse +/- wd Remains medically sedated, RASS -4/-5 Plan: RASS goal -2 Stop propofol, use as needed fentanyl If mental status does not improve given fairly significant thrombocytopenia we may need to consider CT head  DM type 2 w/ hyperglycemia v. hypoglycemia CBGs  continue to fluctuate.  Plan: Hypoglycemia protocol  Sepsis related coagulopathy/DIC with severe thrombocytopenia Plt down trending, 26 (42 on 10/4) Plan Will give 1 dose of vitamin K via tube All heparin's been discontinued Follow-up CBC, transfusion trigger for platelets would be if he gets less than 20,000 and or develops active bleeding  Best Practice (right click and "Reselect all SmartList Selections" daily)   Diet/type: tubefeeds DVT prophylaxis: SCD GI prophylaxis: PPI Lines: Central line, Dialysis Catheter, and Arterial Line Foley:  Yes, and it is still needed Code Status:  full code Last date of multidisciplinary goals of care discussion Aletha Halim continue to discuss with family]  Critical care time: 32 min     Erick Colace ACNP-BC [place] Pager # [phone] OR # [phone] if no answer   Erick Colace ACNP-BC [place] Pager # [phone] OR # [phone] if no answer

## 2021-10-01 NOTE — Progress Notes (Signed)
Pharmacy Antibiotic Note  Tyler Wong is a 54 y.o. male admitted on 10/21/2021.  Pharmacy had been consulted for Zosyn dosing, but due to ongoing thrombocytopenia, pharmacy has been consulted for meropenem dosing for suspected intra-abdominal infection.   Plan: Meropenem 1g IV q8h Follow up renal function, culture results, and clinical course.   Height: 5\' 11"  (180.3 cm) Weight: 74.1 kg (163 lb 5.8 oz) IBW/kg (Calculated) : 75.3  Temp (24hrs), Avg:98.5 F (36.9 C), Min:98.2 F (36.8 C), Max:99 F (37.2 C)  Recent Labs  Lab 09/27/2021 1853 09/27/2021 1955 10/07/2021 2203 09/29/21 0040 09/29/21 0109 09/29/21 0257 09/29/21 0520 09/29/21 0707 09/29/21 1549 09/29/21 2032 09/30/21 0542 09/30/21 0907 09/30/21 1706 10/01/21 0510 10/01/21 1212 10/01/21 1600  WBC 10.9*  --   --   --   --   --  11.9*  --   --  10.6*  --   --   --   --  10.7* 15.1*  CREATININE 4.02*   < >  --    < >  --   --  4.08*   < > 3.53* 2.86* 2.25*  --  1.83* 1.36*  --   --   LATICACIDVEN  --   --  >9.0*  --  >9.0* >9.0*  --   --   --   --   --  1.9  --  3.0*  --   --   VANCORANDOM  --   --   --   --   --   --   --   --   --   --  11  --   --   --   --   --    < > = values in this interval not displayed.    Estimated Creatinine Clearance: 64.3 mL/min (A) (by C-G formula based on SCr of 1.36 mg/dL (H)).    No Known Allergies  Antimicrobials this admission: 10/2 Ceftriaxone x 1 10/2 Azithromycin x 1 10/2 Vancomycin >> 10/3 10/2 Cefepime >>10/3 10/3 Zosyn >> 10/5 10/5 meropenem >>    Dose adjustments this admission:    Microbiology results: 10/2 BCx:  ngtd 10/2 UCx:   ngF 10/3 MRSA PCR: not detected  Thank you for allowing pharmacy to be a part of this patient's care.  Gretta Arab PharmD, BCPS Clinical Pharmacist [facility] main pharmacy [phone] 10/01/2021 5:20 PM

## 2021-10-01 NOTE — Progress Notes (Signed)
Pharmacist Heart Failure Core Measure Documentation  Assessment: Tyler Wong has an EF documented as 20-25% on 10/04/20 by ECHO.  Rationale: Heart failure patients with left ventricular systolic dysfunction (LVSD) and an EF < 40% should be prescribed an angiotensin converting enzyme inhibitor (ACEI) or angiotensin receptor blocker (ARB) at discharge unless a contraindication is documented in the medical record.  This patient is not currently on an ACEI or ARB for HF.  This note is being placed in the record in order to provide documentation that a contraindication to the use of these agents is present for this encounter.  ACE Inhibitor or Angiotensin Receptor Blocker is contraindicated (specify all that apply)  []   ACEI allergy AND ARB allergy []   Angioedema []   Moderate or severe aortic stenosis [x]   Hyperkalemia []   Hypotension []   Renal artery stenosis [x]   Worsening renal function, preexisting renal disease or dysfunction  Entresto currently held due to hyperkalemia and AKI on admit.  Continue to monitor patient and resume Entresto once stabilized.   Dimple Nanas, PharmD 10/01/2021 2:01 PM

## 2021-10-01 NOTE — Clinical Social Work Note (Deleted)
Mr. Eden Lathe presents with Acute on chronic hypoxic and hypercarbic respiratory failure requiring intubation initially secondary to COPD. The use of the NIV will treat patients high PC02 levels (50.2 with elevated Bicarbonate of 32.1 on 09/29/21) and can reduce risk of exacerbations and future hospitalizations when used at night and during the day.  All alternate devices [phone] and [id number]) have been proven ineffective to provide essential volume control necessary to maintain acceptable CO2 levels.  An NIV with AVAPS AE is necessary to prevent patient from life-threatening harm.  Interruption or failure to provide NIV would quickly lead to exacerbation of the patients condition, hospital admission, and likely harm to the patient. Continued use is preferred.  Patient is able to protect their airways and clear secretions on their own.

## 2021-10-02 ENCOUNTER — Inpatient Hospital Stay (HOSPITAL_COMMUNITY): Payer: Medicaid Other

## 2021-10-02 ENCOUNTER — Telehealth (HOSPITAL_COMMUNITY): Payer: Self-pay | Admitting: Licensed Clinical Social Worker

## 2021-10-02 DIAGNOSIS — J9600 Acute respiratory failure, unspecified whether with hypoxia or hypercapnia: Secondary | ICD-10-CM

## 2021-10-02 DIAGNOSIS — R579 Shock, unspecified: Secondary | ICD-10-CM | POA: Diagnosis not present

## 2021-10-02 DIAGNOSIS — I5043 Acute on chronic combined systolic (congestive) and diastolic (congestive) heart failure: Secondary | ICD-10-CM | POA: Diagnosis not present

## 2021-10-02 LAB — RENAL FUNCTION PANEL
Albumin: 2.2 g/dL — ABNORMAL LOW (ref 3.5–5.0)
Albumin: 2.1 g/dL — ABNORMAL LOW (ref 3.5–5.0)
Anion gap: 5 (ref 5–15)
Anion gap: 6 (ref 5–15)
BUN: 40 mg/dL — ABNORMAL HIGH (ref 6–20)
BUN: 39 mg/dL — ABNORMAL HIGH (ref 6–20)
CO2: 26 mmol/L (ref 22–32)
CO2: 26 mmol/L (ref 22–32)
Calcium: 7.7 mg/dL — ABNORMAL LOW (ref 8.9–10.3)
Calcium: 7.6 mg/dL — ABNORMAL LOW (ref 8.9–10.3)
Chloride: 103 mmol/L (ref 98–111)
Chloride: 103 mmol/L (ref 98–111)
Creatinine, Ser: 1.04 mg/dL (ref 0.61–1.24)
Creatinine, Ser: 0.99 mg/dL (ref 0.61–1.24)
GFR, Estimated: >60 mL/min (ref >60)
GFR, Estimated: >60 mL/min (ref >60)
Glucose, Bld: 205 mg/dL — ABNORMAL HIGH (ref 70–99)
Glucose, Bld: 211 mg/dL — ABNORMAL HIGH (ref 70–99)
Phosphorus: 2.9 mg/dL (ref 2.5–4.6)
Phosphorus: 2.7 mg/dL (ref 2.5–4.6)
Potassium: 4.6 mmol/L (ref 3.5–5.1)
Potassium: 4.5 mmol/L (ref 3.5–5.1)
Sodium: 134 mmol/L — ABNORMAL LOW (ref 135–145)
Sodium: 135 mmol/L (ref 135–145)

## 2021-10-02 LAB — CBC WITH DIFFERENTIAL/PLATELET
Abs Immature Granulocytes: 0.18 10*3/uL — ABNORMAL HIGH (ref 0.00–0.07)
Basophils Absolute: 0 10*3/uL (ref 0.0–0.1)
Basophils Relative: 0 %
Eosinophils Absolute: 0 10*3/uL (ref 0.0–0.5)
Eosinophils Relative: 0 %
HCT: 34 % — ABNORMAL LOW (ref 39.0–52.0)
Hemoglobin: 11.6 g/dL — ABNORMAL LOW (ref 13.0–17.0)
Immature Granulocytes: 1 %
Lymphocytes Relative: 1 %
Lymphs Abs: 0.1 10*3/uL — ABNORMAL LOW (ref 0.7–4.0)
MCH: 29.8 pg (ref 26.0–34.0)
MCHC: 34.1 g/dL (ref 30.0–36.0)
MCV: 87.4 fL (ref 80.0–100.0)
Monocytes Absolute: 0.4 10*3/uL (ref 0.1–1.0)
Monocytes Relative: 3 %
Neutro Abs: 11.9 10*3/uL — ABNORMAL HIGH (ref 1.7–7.7)
Neutrophils Relative %: 95 %
Platelets: 15 10*3/uL — CL (ref 150–400)
RBC: 3.89 MIL/uL — ABNORMAL LOW (ref 4.22–5.81)
RDW: 22.2 % — ABNORMAL HIGH (ref 11.5–15.5)
WBC: 12.6 10*3/uL — ABNORMAL HIGH (ref 4.0–10.5)
nRBC: 0.9 % — ABNORMAL HIGH (ref 0.0–0.2)

## 2021-10-02 LAB — ECHOCARDIOGRAM COMPLETE
Area-P 1/2: 5.84 cm2
Calc EF: 14.5 %
Height: 71 in
MV M vel: 5.11 m/s
MV Peak grad: 104.4 mmHg
Radius: 0.6 cm
S' Lateral: 6.4 cm
Single Plane A2C EF: 16.2 %
Single Plane A4C EF: 10.9 %
Weight: 2620.83 oz

## 2021-10-02 LAB — CBC
HCT: 34.1 % — ABNORMAL LOW (ref 39.0–52.0)
Hemoglobin: 11.4 g/dL — ABNORMAL LOW (ref 13.0–17.0)
MCH: 29.4 pg (ref 26.0–34.0)
MCHC: 33.4 g/dL (ref 30.0–36.0)
MCV: 87.9 fL (ref 80.0–100.0)
Platelets: 15 10*3/uL — CL (ref 150–400)
RBC: 3.88 MIL/uL — ABNORMAL LOW (ref 4.22–5.81)
RDW: 22.4 % — ABNORMAL HIGH (ref 11.5–15.5)
WBC: 12 10*3/uL — ABNORMAL HIGH (ref 4.0–10.5)
nRBC: 0.8 % — ABNORMAL HIGH (ref 0.0–0.2)

## 2021-10-02 LAB — LACTIC ACID, PLASMA: Lactic Acid, Venous: 1.4 mmol/L (ref 0.5–1.9)

## 2021-10-02 LAB — GLUCOSE, CAPILLARY
Glucose-Capillary: 180 mg/dL — ABNORMAL HIGH (ref 70–99)
Glucose-Capillary: 189 mg/dL — ABNORMAL HIGH (ref 70–99)
Glucose-Capillary: 192 mg/dL — ABNORMAL HIGH (ref 70–99)
Glucose-Capillary: 197 mg/dL — ABNORMAL HIGH (ref 70–99)
Glucose-Capillary: 200 mg/dL — ABNORMAL HIGH (ref 70–99)
Glucose-Capillary: 212 mg/dL — ABNORMAL HIGH (ref 70–99)

## 2021-10-02 LAB — COOXEMETRY PANEL
Carboxyhemoglobin: 1.1 % (ref 0.5–1.5)
Carboxyhemoglobin: 1.8 % — ABNORMAL HIGH (ref 0.5–1.5)
Methemoglobin: 0.6 % (ref 0.0–1.5)
Methemoglobin: 1 % (ref 0.0–1.5)
O2 Saturation: 61.8 %
O2 Saturation: 77.5 %
Total hemoglobin: 12.1 g/dL (ref 12.0–16.0)
Total hemoglobin: 11.5 g/dL — ABNORMAL LOW (ref 12.0–16.0)

## 2021-10-02 LAB — MAGNESIUM: Magnesium: 2.6 mg/dL — ABNORMAL HIGH (ref 1.7–2.4)

## 2021-10-02 MED ORDER — FENTANYL 2500MCG IN NS 250ML (10MCG/ML) PREMIX INFUSION
0.0000 ug/h | INTRAVENOUS | Status: DC
  Administered 2021-10-02: 25 ug/h via INTRAVENOUS
  Administered 2021-10-03 – 2021-10-04 (×2): 50 ug/h via INTRAVENOUS
  Administered 2021-10-05 – 2021-10-06 (×3): 175 ug/h via INTRAVENOUS
  Administered 2021-10-06: 275 ug/h via INTRAVENOUS
  Administered 2021-10-07: 200 ug/h via INTRAVENOUS
  Administered 2021-10-08 (×2): 225 ug/h via INTRAVENOUS
  Administered 2021-10-09: 275 ug/h via INTRAVENOUS
  Administered 2021-10-09: 250 ug/h via INTRAVENOUS
  Administered 2021-10-10: 175 ug/h via INTRAVENOUS
  Filled 2021-10-02 (×16): qty 250

## 2021-10-02 NOTE — NC FL2 (Signed)
[place] LEVEL OF CARE SCREENING TOOL     IDENTIFICATION  Patient Name: Tyler Wong Birthdate: 02/17/1966 Sex: male Admission Date (Current Location): 10/04/2021  [facility] and [place] Number:  Herbalist and Address:  [facility],  [place] [address], [place]      Provider Number:    Attending Physician Name and Address:  Brand Males, MD  Relative Name and Phone Number:  Lincoln Maxin (Sister)   [phone]    Current Level of Care: Hospital Recommended Level of Care: [place] Prior Approval Number:    Date Approved/Denied: 10/02/21 PASRR Number: [id number]  Discharge Plan: SNF    Current Diagnoses: Patient Active Problem List   Diagnosis Date Noted   Pulmonary edema    Protein-calorie malnutrition, severe 09/29/2021   Nicotine dependence, cigarettes, uncomplicated 38/93/7342   Type 2 diabetes mellitus with stage 3b chronic kidney disease, without long-term current use of insulin ([place]) 07/23/2021   Hyperkalemia 06/18/2021   Lactic acidosis 06/18/2021   RUQ abdominal pain    Cocaine abuse ([place]) 06/17/2021   COPD (chronic obstructive pulmonary disease) ([place]) 06/17/2021   Esophageal candidiasis ([place]) 06/17/2021   Port-A-Cath in place 05/22/2021   Acute dyspnea    CHF exacerbation ([place]) 05/13/2021   Gastric cancer ([place]) 11/29/2020   Abdominal pain, generalized 11/05/2020   Congestive heart failure (CHF) ([employer]) 11/01/2020   CHF (congestive heart failure) ([place]) 10/31/2020   Palliative care by specialist    Encounter for laboratory testing for COVID-19 virus    Acute systolic HF (heart failure) ([place]) 10/03/2020   Acute on chronic systolic CHF (congestive heart failure) ([place]) 10/03/2020   AKI (acute kidney injury) ([place])    Acute respiratory disease due to COVID-19 virus 09/20/2020   Acute respiratory failure due to COVID-19 ([place]) 09/19/2020   Cardiorenal syndrome 06/29/2020   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   Acute on chronic clinical systolic heart failure ([place]) 06/22/2020   Acute on chronic combined systolic and diastolic CHF (congestive heart failure) ([place]) 06/22/2020   Palliative care encounter    Shock ([place]) 04/11/2020   CKD (chronic kidney disease) stage 3, GFR 30-59 ml/min (HCC) 04/11/2020   Symptomatic anemia 04/09/2020   Acute on chronic HFrEF (heart failure with reduced ejection fraction) ([place]) 05/05/2019   HFrEF (heart failure with reduced ejection fraction) ([place]) 05/04/2019   Acute combined systolic and diastolic congestive heart failure ([place]) 10/02/2018   Hepatitis C 04/13/2017   Current non-adherence to medical treatment 04/13/2017   Acute on chronic combined systolic (congestive) and diastolic (congestive) heart failure ([place]) 04/13/2017   HTN (hypertension) 04/12/2017   Insomnia 01/18/2017   Chronic combined systolic and diastolic congestive heart failure ([place]) 01/04/2017   Hyperlipidemia 01/04/2017   Chest pain 12/30/2016   Coronary artery disease involving native coronary artery of native heart without angina pectoris 07/18/2014   Tobacco abuse 07/17/2014   Cocaine use 07/17/2014    Orientation RESPIRATION BLADDER Height & Weight      (Intubated not able to access)  Vent, Other (Comment) (Plan will be to remove vent) Incontinent Weight: 163 lb 12.8 oz (74.3 kg) Height:  5' 11"  (180.3 cm)  BEHAVIORAL SYMPTOMS/MOOD NEUROLOGICAL BOWEL NUTRITION STATUS      Incontinent Diet (Diet NPO time specified)  AMBULATORY STATUS COMMUNICATION OF NEEDS Skin   Extensive Assist Verbally (Verbal communication at baseline. Intubated at time of FL2) Other (Comment) (Pressure injury right foot)  Personal Care Assistance Level of Assistance  Bathing, Feeding, Dressing Bathing Assistance: Maximum assistance Feeding assistance: Limited assistance Dressing Assistance: Maximum assistance     Functional Limitations Info   Sight, Hearing, Speech Sight Info: Adequate Hearing Info: Adequate Speech Info: Impaired (Intubated at time of FL2)    SPECIAL CARE FACTORS FREQUENCY  PT (By licensed PT), OT (By licensed OT)     PT Frequency: 5x OT Frequency: 5x            Contractures Contractures Info: Not present    Additional Factors Info  Code Status, Allergies Code Status Info: Full Allergies Info: n/a           Current Medications (10/02/2021):  This is the current hospital active medication list Current Facility-Administered Medications  Medication Dose Route Frequency Provider Last Rate Last Admin    prismasol BGK 4/2.5 infusion   CRRT Continuous Roney Jaffe, MD 500 mL/hr at 10/02/21 0653 New Bag at 10/02/21 [place] 4/2.5 infusion   CRRT Continuous Roney Jaffe, MD 500 mL/hr at 10/02/21 0652 New Bag at 10/02/21 0652   0.9 %  sodium chloride infusion  250 mL Intravenous Continuous Aldean Jewett, MD   Stopped at 10/01/21 2021   0.9 %  sodium chloride infusion   Intravenous Continuous Ronnette Juniper, MD 20 mL/hr at 10/02/21 1400 Infusion Verify at 10/02/21 1400   calcium gluconate inj 10% (1 g) URGENT USE ONLY!  1 g Intravenous Once Regan Lemming, MD       chlorhexidine gluconate (MEDLINE KIT) (PERIDEX) 0.12 % solution 15 mL  15 mL Mouth Rinse BID Aldean Jewett, MD   15 mL at 10/02/21 0725   Chlorhexidine Gluconate Cloth 2 % PADS 6 each  6 each Topical Daily Regan Lemming, MD   6 each at 10/02/21 0800   dextrose 50 % solution 50 mL  50 mL Intravenous Once Aljishi, Virgina Norfolk, MD       DOBUTamine (DOBUTREX) infusion 4000 mcg/mL  10 mcg/kg/min Intravenous Continuous Erick Colace, NP 10.62 mL/hr at 10/02/21 1400 10 mcg/kg/min at 10/02/21 1400   docusate (COLACE) 50 MG/5ML liquid 100 mg  100 mg Per Tube BID Dimple Nanas, RPH   100 mg at 10/02/21 1021   feeding supplement (PROSource TF) liquid 90 mL  90 mL Per Tube QID Brand Males, MD   90 mL at 10/02/21 1400   feeding  supplement (VITAL 1.5 CAL) liquid 1,000 mL  1,000 mL Per Tube Continuous Brand Males, MD 50 mL/hr at 10/01/21 2224 1,000 mL at 10/01/21 2224   fentaNYL (SUBLIMAZE) injection 100 mcg  100 mcg Intravenous Q2H PRN Aldean Jewett, MD   100 mcg at 10/02/21 1048   fentaNYL 2525mg in NS 2570m(1014mml) infusion-PREMIX  0-400 mcg/hr Intravenous Continuous MicTilden DomeD 5 mL/hr at 10/02/21 1400 50 mcg/hr at 10/02/21 1400   heparin injection 1,000-6,000 Units  1,000-6,000 Units CRRT PRN BhaRosita FireD   3,200 Units at 09/29/21 0936   hydrocortisone sodium succinate (SOLU-CORTEF) 100 MG injection 100 mg  100 mg Intravenous Q12H BabSalvadore Dom NP   100 mg at 10/02/21 1017   insulin aspart (novoLOG) injection 0-6 Units  0-6 Units Subcutaneous Q4H BabErick ColaceP   1 Units at 10/02/21 1217   MEDLINE mouth rinse  15 mL Mouth Rinse 10 times per day AljAldean JewettD   15 mL at 10/02/21 1446   meropenem (MERREM) 1 g in sodium chloride  0.9 % 100 mL IVPB  1 g Intravenous Q8H Shade, Haze Justin, RPH   Stopped at 10/02/21 1045   norepinephrine (LEVOPHED) 16 mg in 274m premix infusion  5-50 mcg/min Intravenous Titrated RBrand Males MD 29.1 mL/hr at 10/02/21 1400 31 mcg/min at 10/02/21 1400   pantoprazole (PROTONIX) injection 40 mg  40 mg Intravenous Q24H BErick Colace NP   40 mg at 10/02/21 1019   polyethylene glycol (MIRALAX / GLYCOLAX) packet 17 g  17 g Per Tube Daily TDimple Nanas RPH   17 g at 10/02/21 1021   prismasol BGK 4/2.5 infusion   CRRT Continuous SRoney Jaffe MD 1,200 mL/hr at 10/02/21 1216 New Bag at 10/02/21 1216   sodium bicarbonate injection 150 mEq  150 mEq Intravenous Once Aljishi, WEstill BattenZ, MD       sodium chloride flush (NS) 0.9 % injection 10-40 mL  10-40 mL Intracatheter Q12H LRegan Lemming MD   40 mL at 10/02/21 1022   sodium chloride flush (NS) 0.9 % injection 10-40 mL  10-40 mL Intracatheter PRN LRegan Lemming MD       vasopressin  (PITRESSIN) 20 Units in sodium chloride 0.9 % 100 mL infusion-*FOR SHOCK*  0-0.03 Units/min Intravenous Continuous BErick Colace NP 9 mL/hr at 10/02/21 1400 0.03 Units/min at 10/02/21 1400     Discharge Medications: Please see discharge summary for a list of discharge medications.  Relevant Imaging Results:  Relevant Lab Results:   Additional Information Pt [id number] BNatasha Bence LCSW

## 2021-10-02 NOTE — Telephone Encounter (Signed)
CSW received call from pt sister, [name], inquiring if we could help figure out what is going on with pt SSA benefit.  Pt has only been getting $30/month since he was in ALF but even after providing SSA with DC papers his rate has not increased back to the full $841 amount.  Also states that he was supposed to get back pay of $9000 but that has not been deposited yet either. They are wanting to get finances in place in case pt will need funeral or long term care expenses.  She has been unable to speak with SSA on his behalf as she is not an authorized representative and pt is currently vented and disoriented so he cannot speak to them regarding his case.  CSW messaged servant center who assisted with his application and is listed as an authorized representative- they will follow up regarding his case on Monday and let me know the status.  Will continue to follow and assist as needed  [name], [facility] Desk#: [phone] Cell#: [phone]

## 2021-10-02 NOTE — Progress Notes (Signed)
Inpatient Diabetes Program Recommendations  AACE/ADA: New Consensus Statement on Inpatient Glycemic Control   Target Ranges:  Prepandial:   less than 140 mg/dL      Peak postprandial:   less than 180 mg/dL (1-2 hours)      Critically ill patients:  140 - 180 mg/dL    Review of Glycemic Control Results for Tyler Wong, Tyler Wong (MRN [id number]) as of 10/02/2021 08:58  Ref. Range 10/01/2021 16:33 10/01/2021 19:49 10/02/2021 00:06 10/02/2021 04:06 10/02/2021 07:21  Glucose-Capillary Latest Ref Range: 70 - 99 mg/dL 229 (H) 233 (H) 212 (H) 200 (H) 197 (H)   Diabetes history: DM2 Outpatient Diabetes medications: Jardiance 10 mg daily Current orders for Inpatient glycemic control:  Novolog 0-6 units Q4H  Solucortef 100 mg Q12H Vital @ 50 ml/hr  Inpatient Diabetes Program Recommendations:    -  Consider ordering Novolog 2 units Q4H for tube feeding coverage. If tube feeding is stopped or held then Novolog tube feeding coverage should also be stopped or held.  Thanks, Tama Headings RN, MSN, BC-ADM Inpatient Diabetes Coordinator Team Pager [phone] (8a-5p)

## 2021-10-02 NOTE — Plan of Care (Signed)

## 2021-10-02 NOTE — TOC Initial Note (Addendum)
Transition of Care [facility]) - Initial/Assessment Note    Patient Details  Name: Tyler Wong MRN: [id number] Date of Birth: 12-Jan-1966  [facility]) CM/SW Contact:    Natasha Bence, LCSW Phone Number: 10/02/2021, 3:21 PM  Clinical Narrative:                 Patient is a 55 year old male admitted for Protein-calorie malnutrition, severe. CSW conducted initial assessment and readmission risk assessment. Patient's sister reported that patient was ambulatory a few weeks prior to admission but has experienced increasing weakness. [place] reported that their facility had closed and that patient had been referred to another facility. Coordinator reported that patient had been referred to another facility. Coordiantor reported that they believed patient's needs were greater that an assistant living or group home due to the progression of patient's cancer. Patient's family agreeable to SNF and LTC following SNF and expressed preference for [facility]. CSW  completed FL2 and faxed out patient. CSW also Garment/textile technologist to inquire about SNF and LTC medicaid benefits for patient. TOC to follow.  Expected Discharge Plan: Skilled Nursing Facility Barriers to Discharge: Continued Medical Work up   Patient Goals and CMS Choice Patient states their goals for this hospitalization and ongoing recovery are:: Rehab with SNF CMS Medicare.gov Compare Post Acute Care list provided to:: Patient Choice offered to / list presented to : Patient  Expected Discharge Plan and Services Expected Discharge Plan: [place]   Discharge Planning Services: CM Consult   Living arrangements for the past 2 months: Group Home                                      Prior Living Arrangements/Services Living arrangements for the past 2 months: Group Home Lives with:: Self Patient language and need for interpreter reviewed:: Yes Do you feel safe going back to  the place where you live?: Yes      Need for Family Participation in Patient Care: Yes (Comment) Care giver support system in place?: Yes (comment)   Criminal Activity/Legal Involvement Pertinent to Current Situation/Hospitalization: No - Comment as needed  Activities of Daily Living Home Assistive Devices/Equipment: Other (Comment) (UTA) ADL Screening (condition at time of admission) Patient's cognitive ability adequate to safely complete daily activities?: No Is the patient deaf or have difficulty hearing?: No (UTA) Does the patient have difficulty seeing, even when wearing glasses/contacts?: No (UTA) Does the patient have difficulty concentrating, remembering, or making decisions?: No (UTA) Patient able to express need for assistance with ADLs?: No (UTA) Does the patient have difficulty dressing or bathing?: No (UTA) Independently performs ADLs?: No (UTA) Communication: Dependent Is this a change from baseline?: Change from baseline, expected to last >3 days Dressing (OT): Dependent Is this a change from baseline?: Change from baseline, expected to last >3 days Grooming: Dependent Is this a change from baseline?: Change from baseline, expected to last >3 days Feeding: Dependent Is this a change from baseline?: Change from baseline, expected to last >3 days Bathing: Dependent Is this a change from baseline?: Change from baseline, expected to last >3 days Toileting: Dependent Is this a change from baseline?: Change from baseline, expected to last >3days In/Out Bed: Dependent Is this a change from baseline?: Change from baseline, expected to last >3 days Walks in Home: Dependent Is this a change from baseline?: Change from baseline, expected to  last >3 days Does the patient have difficulty walking or climbing stairs?: No (UTA) Weakness of Legs: None Weakness of Arms/Hands: None  Permission Sought/Granted Permission sought to share information with : Family Supports Permission  granted to share information with : Yes, Verbal Permission Granted  Share Information with NAME: Gooding,Angelia  Permission granted to share info w AGENCY: Local SNF  Permission granted to share info w Relationship: (Sister)  Permission granted to share info w Contact Information: [phone]  Emotional Assessment Appearance:: Appears stated age   Affect (typically observed): Accepting   Alcohol / Substance Use: Not Applicable Psych Involvement: No (comment)  Admission diagnosis:  Cardiogenic shock ([place]) [R57.0] Shock ([place]) [R57.9] AKI (acute kidney injury) ([place]) [N17.9] Acute on chronic congestive heart failure, unspecified heart failure type ([place]) [I50.9] Sepsis with acute renal failure and septic shock, due to unspecified organism, unspecified acute renal failure type ([place]) [A41.9, R65.21, N17.9] Patient Active Problem List   Diagnosis Date Noted   Pulmonary edema    Protein-calorie malnutrition, severe 09/29/2021   Nicotine dependence, cigarettes, uncomplicated 23/76/2831   Type 2 diabetes mellitus with stage 3b chronic kidney disease, without long-term current use of insulin ([place]) 07/23/2021   Hyperkalemia 06/18/2021   Lactic acidosis 06/18/2021   RUQ abdominal pain    Cocaine abuse ([place]) 06/17/2021   COPD (chronic obstructive pulmonary disease) ([place]) 06/17/2021   Esophageal candidiasis ([place]) 06/17/2021   Port-A-Cath in place 05/22/2021   Acute dyspnea    CHF exacerbation ([place]) 05/13/2021   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   Abdominal pain, generalized 11/05/2020   Congestive heart failure (CHF) ([place]) 11/01/2020   CHF (congestive heart failure) ([place]) 10/31/2020   Palliative care by specialist    Encounter for laboratory testing for COVID-19 virus    Acute systolic HF (heart failure) ([place]) 10/03/2020   Acute on chronic systolic CHF (congestive heart failure) ([place]) 10/03/2020   AKI (acute kidney injury) ([place])    Acute respiratory disease due to COVID-19 virus 09/20/2020    Acute respiratory failure due to COVID-19 ([place]) 09/19/2020   Cardiorenal syndrome 06/29/2020   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   Acute on chronic clinical systolic heart failure ([place]) 06/22/2020   Acute on chronic combined systolic and diastolic CHF (congestive heart failure) ([place]) 06/22/2020   Palliative care encounter    Shock ([place]) 04/11/2020   CKD (chronic kidney disease) stage 3, GFR 30-59 ml/min (HCC) 04/11/2020   Symptomatic anemia 04/09/2020   Acute on chronic HFrEF (heart failure with reduced ejection fraction) ([place]) 05/05/2019   HFrEF (heart failure with reduced ejection fraction) ([place]) 05/04/2019   Acute combined systolic and diastolic congestive heart failure ([place]) 10/02/2018   Hepatitis C 04/13/2017   Current non-adherence to medical treatment 04/13/2017   Acute on chronic combined systolic (congestive) and diastolic (congestive) heart failure ([place]) 04/13/2017   HTN (hypertension) 04/12/2017   Insomnia 01/18/2017   Chronic combined systolic and diastolic congestive heart failure ([place]) 01/04/2017   Hyperlipidemia 01/04/2017   Chest pain 12/30/2016   Coronary artery disease involving native coronary artery of native heart without angina pectoris 07/18/2014   Tobacco abuse 07/17/2014   Cocaine use 07/17/2014   PCP:  [name], NP Pharmacy:   [place], [place] - [address] [place] [address] Phone: [phone] Fax: [phone]  [facility] [address] [place] [address] Phone: [phone] Fax: [place] [facility]  [place] [address] Phone: [phone] Fax: [place] [address] [address] Phone: [phone] Fax: [phone]     Social Determinants of Health (SDOH) Interventions    Readmission Risk Interventions Readmission  Risk Prevention Plan 10/02/2021 09/30/2021 06/20/2021  Transportation Screening Complete Complete Complete  Medication Review Press photographer) Complete Complete Complete  PCP or Specialist appointment within 3-5 days of discharge - Complete Complete  HRI or Home Care Consult Complete Complete Complete  SW Recovery Care/Counseling Consult Complete Complete Complete  Palliative Care Screening Not Applicable Not Applicable Not Applicable  Skilled Nursing Facility Complete Not Applicable Not Applicable  Some recent data might be hidden

## 2021-10-02 NOTE — Progress Notes (Signed)
[facility] Progress Note  Subjective: pt seen in ICU, I/O +590 cc yesterday.   Vitals:   10/02/21 1015 10/02/21 1030 10/02/21 1045 10/02/21 1100  BP:      Pulse: 90 92 93 92  Resp: _0 Temp: 97.9 F (36.6 C) 97.9 F (36.6 C) 97.9 F (36.6 C) 97.9 F (36.6 C)  TempSrc:      SpO2: 100% 100% 100% 100%  Weight:      Height:        Exam: on vent ,sedated  no jvd  throat ett in place  Chest cta bilat and lat  Cor reg no RG  Abd soft ntnd no ascites   Ext diffuse mild-mod pitting edema of UE's and LE's   Neuro on vent and sedated, not following commands    R fem HD cath and a-line in place     UA 10/02 - mod Hb, prot 100, 6-10 rbcs, 0-5 wbcs   Renal US 10/03 - 10 cm kidneys bilat w/o hydro, normal echotexture  CXR : IMPRESSION: 1. Interval placement of right subclavian central venous catheter with tip in the projection of the SVC. No pneumothorax. 2. Decreased aeration to the retrocardiac left lung base   Assessment/ Plan: AKI on CKD stage 3-  b/l creatinine is 1.42- 1.92 from July 2022, egfr 41-58 ml/min. AKI presumably due to cardiogenic shock/ cardiorenal syndrome.  UA unremarkable. [place] w/o signs of obstruction.  LVEF around 20%, multiple previous admissions for low cardiac output heart failure. On levo/ vaso gtt's and IV dobutamine due to shock. Started CRRT on 10/3. Not improving from shock standpoint, remains on 2 pressors and 1 inotrope. Keeping +50 cc/hr w/ CRRT. ECHO pending today. Prognosis very poor. Consider palliative input? Will cont CRRT for now.   Acute on chronic systolic and diastolic CHF - not able to pull much fluid. Keeping +50cc/hr.  Cardiogenic shock - remains on dobutamine and 2 pressors Substance abuse CAD sp STEMI Tobacco use COPD     Rob Oona Trammel 10/02/2021, 12:40 PM   Recent Labs  Lab 10/01/21 1600 10/01/21 1620 10/01/21 2035 10/02/21 0524 10/02/21 0825  K 4.8  --   --  4.6  --   BUN 40*  --   --  40*  --    CREATININE 1.20  --   --  1.04  --   CALCIUM 8.4*  --   --  7.7*  --   PHOS 3.2 3.3  --  2.9  --   HGB 11.9*  --  12.1*  --  11.6*    Inpatient medications:  calcium gluconate  1 g Intravenous Once   chlorhexidine gluconate (MEDLINE KIT)  15 mL Mouth Rinse BID   Chlorhexidine Gluconate Cloth  6 each Topical Daily   dextrose  50 mL Intravenous Once   docusate  100 mg Per Tube BID   feeding supplement (PROSource TF)  90 mL Per Tube QID   hydrocortisone sod succinate (SOLU-CORTEF) inj  100 mg Intravenous Q12H   insulin aspart  0-6 Units Subcutaneous Q4H   mouth rinse  15 mL Mouth Rinse 10 times per day   pantoprazole (PROTONIX) IV  40 mg Intravenous Q24H   polyethylene glycol  17 g Per Tube Daily   sodium bicarbonate  150 mEq Intravenous Once   sodium chloride flush  10-40 mL Intracatheter Q12H     prismasol BGK 4/2.5 500 mL/hr at 10/02/21 0653    prismasol BGK 4/2.5 500  mL/hr at 10/02/21 0652   sodium chloride Stopped (10/01/21 2021)   sodium chloride 20 mL/hr at 10/02/21 1200   DOBUTamine 10 mcg/kg/min (10/02/21 1200)   feeding supplement (VITAL 1.5 CAL) 1,000 mL (10/01/21 2224)   fentaNYL infusion INTRAVENOUS 50 mcg/hr (10/02/21 1200)   meropenem (MERREM) IV Stopped (10/02/21 1045)   norepinephrine (LEVOPHED) Adult infusion 31 mcg/min (10/02/21 1200)   prismasol BGK 4/2.5 1,200 mL/hr at 10/02/21 1216   vasopressin 0.03 Units/min (10/02/21 1200)   fentaNYL (SUBLIMAZE) injection, heparin, sodium chloride flush

## 2021-10-02 NOTE — Progress Notes (Signed)
  Echocardiogram 2D Echocardiogram has been performed.  Tyler Wong 10/02/2021, 2:22 PM

## 2021-10-02 NOTE — Progress Notes (Signed)
NAME:  Tyler Wong, MRN:  [id number], DOB:  07/18/1966, LOS: 4 ADMISSION DATE:  10/06/2021, CONSULTATION DATE:  10/12/2021 REFERRING MD:  , CHIEF COMPLAINT:  SOB   BRIEF  55 year old male with history of Gastric cancer, HTN, DM type II, CAD, HFrEF (20%), Polysubstance abuse CKD stage 3b, arrived via EMS due to shortness of breath he was found to be hypothermic and hypoglycemic he was sitting in the rain in the cold for a few hour. On arrival: hypotensive with systolic of 27/51 with labs showing acute on chronic kidney disease with potassium of 5.9.  Patient was acidotic with pH of 7.27 and bicarb of 8.  Initial exam: agitated tachypneic he was on Levophed 40 mics and is being treated for hyperkalemia.  Patient was evaluated by nephrology and will continue medical management and possible need for CRRT if no improvement.  Patient was agitated very hard to get any history from.  Pertinent  Medical History  -Gastric cancer;  CR w/ Chemo. Lost to follow up  -Hypertension -Diabetes mellitus -CAD -Congestive heart failure ejection fraction 20% -Cocaine and polysubstance abuse -Diabetes mellitus -Cachexia lean body mass index -Polysubstance abuse with cocaine benzodiazepine and marijuana positivity multiple occasions  Significant Hospital Events: Including procedures, antibiotic start and stop dates in addition to other pertinent events   10/2: admit to inpatient.  Urine toxicology positive for cocaine 10/3: vanc and cefepime started. CVC, A line, and HD catheters placed. Start CRRT. Vasopressors x2 in addition to inotrope support  Goals of care discussed with family. Remains Full code per patient's wishes with family members PTA. 10/4: Continue CRRT, vasopressors x2 still high dose. Dobutamine at 7.5; lactic acid down to 1.9  10/5: wean off propofol. Continue CRRT. Co-ox dropping. Cvp and exam suggest euvolemic so changed volume goal to +50 cc an hour  Interim History / Subjective:    10/6 -30%  FiO2 on the ventilator.  Remains on dobutamine infusion, vasopressin infusion, Levophed infusion.  Sedated with fentanyl infusion.  Off propofol.  Getting tube feeds.  CRRT ongoing.  Ur Op might be picking up. Is afebrile with a rising white count of 15,000.  On broad antibiotics of meropenem [antibiotic changed because of dropping platelet count].  Culture negative so far.  Remains coagulopathic with INR of 2.2. Plateltes pending  Coox 61.8  Objective   Blood pressure (!) 86/54, pulse 88, temperature 97.9 F (36.6 C), resp. rate 20, height 5\' 11"  (1.803 m), weight 74.3 kg, SpO2 96 %. CVP:  [9 mmHg-18 mmHg] 13 mmHg  Vent Mode: PRVC FiO2 (%):  [30 %] 30 % Set Rate:  [20 bmp] 20 bmp Vt Set:  [450 mL] 450 mL PEEP:  [5 cmH20] 5 cmH20 Plateau Pressure:  [14 cmH20-15 cmH20] 15 cmH20   Intake/Output Summary (Last 24 hours) at 10/02/2021 0758 Last data filed at 10/02/2021 0700 Gross per 24 hour  Intake 4327.26 ml  Output 3043 ml  Net 1284.26 ml   Filed Weights   09/30/21 0727 10/01/21 0500 10/02/21 0500  Weight: 72.2 kg 74.1 kg 74.3 kg    Examination: General Appearance:  Looks criticall ill. Thin Head:  Normocephalic, without obvious abnormality, atraumatic Eyes:  PERRL - yes, conjunctiva/corneas - muddy. Reflex +     Ears:  Normal external ear canals, both ears Nose:  G tube - no Throat:  ETT TUBE - yes , OG tube - yes Neck:  Supple,  No enlargement/tenderness/nodules Lungs: Clear to auscultation bilaterally, Ventilator   Synchrony - yes 30%  Heart:  S1 and S2 normal, no murmur, CVP - no.  Pressors - yes Abdomen:  Soft, no masses, no organomegaly Genitalia / Rectal:  Not done Extremities:  Extremities- intact Skin:  ntact in exposed areas . Sacral area - no decub reported Neurologic:  Sedation - fent gtt -> RASS - -3 . Moves all 4s - yes. CAM-ICU - cannot test . Orientation - not     Resolved Hospital Problem list   Hyperglycemia Lactic acidosis (resolved 10/4) Life  threatening hyperkalemia (resolved 10/4)  Assessment & Plan:  Cardiogenic shock (POA) w/ progressive Multiple Organ Failure  cannot rule out septic shock (procalcitonin was elevated: inclined to think may be bacterial translocation from gut) - present on admission   10/6 - Remains on 2 vasopressors & high dose inotrope, and hydrocort . Blood Cultures: No growth . CCox 61.8   Plan Continuing to keep him euvolemic per renal 10/01/21 Vasopressin and norepinephrine for mean arterial pressure greater than 65 Continue dobutamine without titration Off aspirin due to low platets Holding BiDil, Entresto, metolazone, spironolactone all in setting of shock Continue stress dose steroids for now (uspect the cortisol of greater than 100 reflects lab draw after hydrocortisone administered) ECHO  Acute respiratory failure present on admission secondary to end organ hypoperfusion and profound metabolic acidosis - present on admit   10/02/2021 - > does not meet criteria for SBT/Extubation in setting of Acute Respiratory Failure due to multi organ failure  Plan Continue full ventilator support, Will decrease minute ventilation further to support spontaneous ventilation efforts Daily assessment for weaning VAP bundle PAD protocol changing RASS goal to -2 A.m. chest x-ray Pulse oximetry  Acute on chronic renal failure (CKD stage 3b)  - present on admit. On CRRT this admit  10/02/2021 - improving Ur OP per RN. Lactic acidosis resolved  Plan: Continuing CRRT as directed by nephrology Volume goal as mentioned above Strict intake output Serial labs   Probable bowel ischemia/ischemic colitis in the setting of organ hypoperfusion and profound lactic acidosis, further supported by CT abdomen findings-->lactate cleared on 10/4, increased again 10/5 to 3. Will continue to follow.   10/02/2021 - tolerting trickle feds  Plan: Continue trickle feeds   Acute metabolic encephalopathy in setting of  acidosis, shock, also could be element of toxic encephalopathy due to polysubstance abuse +/- wd  10/02/2021  Remains medically sedated, RASS -4/-5. AT risk for Intracranial bleed with low platelets/DIC but no focal deficits   Plan: RASS goal -2 Fentanyl  If mental status does not improve given fairly significant thrombocytopenia we may need to consider CT head   Sepsis related coagulopathy/DIC with severe thrombocytopenia - present on admit  10/02/2021 - plat down to 16K yesterday.  Off aspirin and heparin. Duplex LE neg DVT . Normal HIT Ab 09/30/21. Sp vit K 10/01/21  Plan Monitor closely Plat for active bleeding or <= 10K   Anemia or citicall illness - onset of 09/29/21   - 10/6 - no active bleeding  Plan  - - PRBC for hgb </= 6.9gm%    - exceptions are   -  if ACS susepcted/confirmed then transfuse for hgb </= 8.0gm%,  or    -  active bleeding with hemodynamic instability, then transfuse regardless of hemoglobin value   At at all times try to transfuse 1 unit prbc as possible with exception of active hemorrhage   DM type 2 w/ hyperglycemia v. Hypoglycemia - CBGs continue to fluctuate.   Plan: Hypoglycemia protocol and ssi  Best Practice (right click and "Reselect all SmartList Selections" daily)   Diet/type: tubefeeds DVT prophylaxis: SCD GI prophylaxis: PPI Lines: Central line, Dialysis Catheter, and Arterial Line Foley:  Yes, and it is still needed Code Status:  full code Last date of multidisciplinary goals of care discussion   - [phone] Angelia Gooding Sister DPOA  -> explained prognosis and heading towards LTAC. Recommended DNR in case of arrest. Sister said that patient always wanted CPR  - substrituted judgement. Angelia says she is a Nurse, learning disability and she understands in best interest patient should be no CPR but says other sister would not need more support in agreeing to DNR. For now full code    [place]   The patient Tyler Wong is  critically ill with multiple organ systems failure and requires high complexity decision making for assessment and support, frequent evaluation and titration of therapies, application of advanced monitoring technologies and extensive interpretation of multiple databases.   Critical Care Time devoted to patient care services described in this note is  60  Minutes. This time reflects time of care of this signee Dr Brand Males. This critical care time does not reflect procedure time, or teaching time or supervisory time of PA/NP/Med student/Med Resident etc but could involve care discussion time     Dr. Brand Males, M.D., [facility].C.P Pulmonary and Critical Care Medicine Staff Physician [facility] Pager: [phone], If no answer or between  15:00h - 7:00h: call [phone]  10/02/2021 7:59 AM    LABS    PULMONARY Recent Labs  Lab 10/21/2021 1955 10/05/2021 2122 10/02/2021 2122 10/13/2021 2310 09/29/21 0120 09/29/21 1252 09/29/21 2033 09/30/21 1100 10/01/21 0801 10/01/21 1212 10/01/21 1505 10/02/21 0524  PHART  --   --   --  7.123* 7.218*  --  7.506* 7.438  --   --   --   --   PCO2ART  --   --   --  36.4 33.7  --  31.2* 34.9  --   --   --   --   PO2ART  --   --   --  300* 190*  --  184* 180*  --   --   --   --   HCO3  --  12.2*  --  11.4* 13.5*  --  24.5 23.2  --   --   --   --   TCO2 13*  --   --   --   --   --   --   --   --   --   --   --   O2SAT  --  69.3   < > 98.9 99.0   < > 99.9 99.5 63.4 62.9 68.8 61.8   < > = values in this interval not displayed.    CBC Recent Labs  Lab 10/01/21 1212 10/01/21 1600 10/01/21 2035  HGB 12.3* 11.9* 12.1*  HCT 36.3* 35.2* 34.9*  WBC 10.7* 15.1* 15.0*  PLT 19* 17* 16*    COAGULATION Recent Labs  Lab 10/18/2021 1923 09/30/21 1403  INR 3.3* 2.3*    CARDIAC  No results for input(s): TROPONINI in the last 168 hours. No results for input(s): PROBNP in the last 168  hours.   CHEMISTRY Recent Labs  Lab 09/30/21 0542 09/30/21 1031 09/30/21 1706 10/01/21 0510 10/01/21 1600 10/01/21 1620 10/02/21 0524  NA 138  --  136 135 142  --  134*  K 4.5  --  4.9 4.9 4.8  --  4.6  CL 107  --  103 104 109  --  103  CO2 23  --  23 19* 25  --  26  GLUCOSE 84  --  143* 201* 244*  --  205*  BUN 64*  --  46* 40* 40*  --  40*  CREATININE 2.25*  --  1.83* 1.36* 1.20  --  1.04  CALCIUM 7.8*  --  7.8* 8.3* 8.4*  --  7.7*  MG 2.6* 2.5* 2.5* 2.5*  --  2.7* 2.6*  PHOS 3.0 2.9 3.8  3.7 4.6  4.4 3.2 3.3 2.9   Estimated Creatinine Clearance: 84.3 mL/min (by C-G formula based on SCr of 1.04 mg/dL).   LIVER Recent Labs  Lab 10/27/2021 1853 10/12/2021 1923 09/29/21 1549 09/29/21 2032 09/30/21 0542 09/30/21 1403 09/30/21 1706 10/01/21 0510 10/01/21 1600 10/02/21 0524  AST 116*  --   --  128*  --   --   --   --   --   --   ALT 80*  --   --  64*  --   --   --   --   --   --   ALKPHOS 161*  --   --  120  --   --   --   --   --   --   BILITOT 7.0*  --   --  5.1*  --   --   --   --   --   --   PROT 7.4  --   --  5.7*  --   --   --   --   --   --   ALBUMIN 3.3*  --    < > 2.4* 2.3*  --  2.3* 2.4* 2.2* 2.2*  INR  --  3.3*  --   --   --  2.3*  --   --   --   --    < > = values in this interval not displayed.     INFECTIOUS Recent Labs  Lab 09/29/21 1252 09/30/21 0542 09/30/21 0907 10/01/21 0510 10/01/21 1700 10/02/21 0524  LATICACIDVEN  --   --    < > 3.0* 1.9 1.4  PROCALCITON 23.28 11.71  --  6.18  --   --    < > = values in this interval not displayed.     ENDOCRINE CBG (last 3)  Recent Labs    10/02/21 0006 10/02/21 0406 10/02/21 0721  GLUCAP 212* 200* 197*         IMAGING x48h  - image(s) personally visualized  -   highlighted in bold DG Chest Port 1 View  Result Date: 09/30/2021 CLINICAL DATA:  ETT, pulmonary edema EXAM: PORTABLE CHEST 1 VIEW COMPARISON:  Chest radiograph dated 1 day prior FINDINGS: The endotracheal tube tip is  approximately 5.4 cm from the carina. A right-sided central venous catheter and left chest wall port both terminate in the region of the cavoatrial junction. Enteric catheter courses off the field of view. The heart is enlarged, unchanged. The mediastinal contours are stable. There is unchanged dense retrocardiac opacity with a left pleural effusion. The left upper lung is well-aerated. The right lung is clear. There is no right effusion. There is no pneumothorax. The bones are stable. IMPRESSION: 1. Unchanged left pleural effusion with adjacent airspace disease. 2. Unchanged cardiomegaly. Electronically Signed   By: Court Joy.D.  On: 09/30/2021 11:59   VAS [place] LOWER EXTREMITY VENOUS (DVT)  Result Date: 09/30/2021  Lower Venous DVT Study Patient Name:  CHRISTIANO BLANDON  Date of Exam:   09/30/2021 Medical Rec #: [id number]    Accession #:    [id number] Date of Birth: Jul 19, 1966   Patient Gender: M Patient Age:   60 years Exam Location:  [facility] Procedure:      VAS [place] LOWER EXTREMITY VENOUS (DVT) Referring Phys: Shanyce Daris --------------------------------------------------------------------------------  Indications: Edema.  Risk Factors: None identified. Limitations: Bandages, line and patient positioning, patient immobility. Comparison Study: No prior studies. Performing Technologist: Oliver Hum RVT  Examination Guidelines: A complete evaluation includes B-mode imaging, spectral Doppler, color Doppler, and power Doppler as needed of all accessible portions of each vessel. Bilateral testing is considered an integral part of a complete examination. Limited examinations for reoccurring indications may be performed as noted. The reflux portion of the exam is performed with the patient in reverse Trendelenburg.  +---------+---------------+---------+-----------+----------+-------------------+ RIGHT    CompressibilityPhasicitySpontaneityPropertiesThrombus Aging       +---------+---------------+---------+-----------+----------+-------------------+ CFV                                                   Not well visualized +---------+---------------+---------+-----------+----------+-------------------+ SFJ                                                   Not well visualized +---------+---------------+---------+-----------+----------+-------------------+ FV Prox                                               Not well visualized +---------+---------------+---------+-----------+----------+-------------------+ FV Mid   Full           Yes      No                                       +---------+---------------+---------+-----------+----------+-------------------+ FV DistalFull                                                             +---------+---------------+---------+-----------+----------+-------------------+ PFV                                                   Not well visualized +---------+---------------+---------+-----------+----------+-------------------+ POP      Full           Yes      No                                       +---------+---------------+---------+-----------+----------+-------------------+ PTV      Full                                                             +---------+---------------+---------+-----------+----------+-------------------+  PERO     Full                                                             +---------+---------------+---------+-----------+----------+-------------------+   +---------+---------------+---------+-----------+----------+--------------+ LEFT     CompressibilityPhasicitySpontaneityPropertiesThrombus Aging +---------+---------------+---------+-----------+----------+--------------+ CFV      Full           Yes      No                                  +---------+---------------+---------+-----------+----------+--------------+ SFJ      Full                                                         +---------+---------------+---------+-----------+----------+--------------+ FV Prox  Full                                                        +---------+---------------+---------+-----------+----------+--------------+ FV Mid   Full                                                        +---------+---------------+---------+-----------+----------+--------------+ FV DistalFull                                                        +---------+---------------+---------+-----------+----------+--------------+ PFV      Full                                                        +---------+---------------+---------+-----------+----------+--------------+ POP      Full           Yes      No                                  +---------+---------------+---------+-----------+----------+--------------+ PTV      Full                                                        +---------+---------------+---------+-----------+----------+--------------+ PERO     Full                                                        +---------+---------------+---------+-----------+----------+--------------+  Summary: RIGHT: - There is no evidence of deep vein thrombosis in the lower extremity. However, portions of this examination were limited- see technologist comments above.  - No cystic structure found in the popliteal fossa.  LEFT: - There is no evidence of deep vein thrombosis in the lower extremity.  - No cystic structure found in the popliteal fossa.  *See table(s) above for measurements and observations. Electronically signed by Deitra Mayo MD on 09/30/2021 at 5:31:57 PM.    Final

## 2021-10-03 ENCOUNTER — Inpatient Hospital Stay (HOSPITAL_COMMUNITY): Payer: Medicaid Other

## 2021-10-03 DIAGNOSIS — R579 Shock, unspecified: Secondary | ICD-10-CM | POA: Diagnosis not present

## 2021-10-03 DIAGNOSIS — J9601 Acute respiratory failure with hypoxia: Secondary | ICD-10-CM | POA: Diagnosis not present

## 2021-10-03 LAB — RENAL FUNCTION PANEL
Albumin: 2.1 g/dL — ABNORMAL LOW (ref 3.5–5.0)
Albumin: 2.1 g/dL — ABNORMAL LOW (ref 3.5–5.0)
Anion gap: 3 — ABNORMAL LOW (ref 5–15)
Anion gap: 6 (ref 5–15)
BUN: 37 mg/dL — ABNORMAL HIGH (ref 6–20)
BUN: 37 mg/dL — ABNORMAL HIGH (ref 6–20)
CO2: 26 mmol/L (ref 22–32)
CO2: 26 mmol/L (ref 22–32)
Calcium: 7.7 mg/dL — ABNORMAL LOW (ref 8.9–10.3)
Calcium: 7.5 mg/dL — ABNORMAL LOW (ref 8.9–10.3)
Chloride: 105 mmol/L (ref 98–111)
Chloride: 106 mmol/L (ref 98–111)
Creatinine, Ser: 0.94 mg/dL (ref 0.61–1.24)
Creatinine, Ser: 1.02 mg/dL (ref 0.61–1.24)
GFR, Estimated: >60 mL/min (ref >60)
GFR, Estimated: >60 mL/min (ref >60)
Glucose, Bld: 221 mg/dL — ABNORMAL HIGH (ref 70–99)
Glucose, Bld: 214 mg/dL — ABNORMAL HIGH (ref 70–99)
Phosphorus: 2.9 mg/dL (ref 2.5–4.6)
Phosphorus: 2.8 mg/dL (ref 2.5–4.6)
Potassium: 4.3 mmol/L (ref 3.5–5.1)
Potassium: 4.3 mmol/L (ref 3.5–5.1)
Sodium: 134 mmol/L — ABNORMAL LOW (ref 135–145)
Sodium: 138 mmol/L (ref 135–145)

## 2021-10-03 LAB — GLUCOSE, CAPILLARY
Glucose-Capillary: 185 mg/dL — ABNORMAL HIGH (ref 70–99)
Glucose-Capillary: 189 mg/dL — ABNORMAL HIGH (ref 70–99)
Glucose-Capillary: 192 mg/dL — ABNORMAL HIGH (ref 70–99)
Glucose-Capillary: 199 mg/dL — ABNORMAL HIGH (ref 70–99)
Glucose-Capillary: 204 mg/dL — ABNORMAL HIGH (ref 70–99)
Glucose-Capillary: 209 mg/dL — ABNORMAL HIGH (ref 70–99)

## 2021-10-03 LAB — TRIGLYCERIDES: Triglycerides: 67 mg/dL (ref <150)

## 2021-10-03 LAB — CBC
HCT: 34.4 % — ABNORMAL LOW (ref 39.0–52.0)
Hemoglobin: 11.5 g/dL — ABNORMAL LOW (ref 13.0–17.0)
MCH: 29.9 pg (ref 26.0–34.0)
MCHC: 33.4 g/dL (ref 30.0–36.0)
MCV: 89.6 fL (ref 80.0–100.0)
Platelets: 16 10*3/uL — CL (ref 150–400)
RBC: 3.84 MIL/uL — ABNORMAL LOW (ref 4.22–5.81)
RDW: 22.3 % — ABNORMAL HIGH (ref 11.5–15.5)
WBC: 12.2 10*3/uL — ABNORMAL HIGH (ref 4.0–10.5)
nRBC: 0.6 % — ABNORMAL HIGH (ref 0.0–0.2)

## 2021-10-03 LAB — MAGNESIUM: Magnesium: 2.5 mg/dL — ABNORMAL HIGH (ref 1.7–2.4)

## 2021-10-03 LAB — COOXEMETRY PANEL
Carboxyhemoglobin: 1.1 % (ref 0.5–1.5)
Methemoglobin: 0.6 % (ref 0.0–1.5)
O2 Saturation: 58.2 %
Total hemoglobin: 10.9 g/dL — ABNORMAL LOW (ref 12.0–16.0)

## 2021-10-03 MED ORDER — CHLORHEXIDINE GLUCONATE 0.12 % MT SOLN
OROMUCOSAL | Status: AC
  Filled 2021-10-03: qty 15

## 2021-10-03 MED ORDER — VANCOMYCIN HCL IN DEXTROSE 1-5 GM/200ML-% IV SOLN: 1000.0000 mg | INTRAVENOUS | Status: DC

## 2021-10-03 MED ORDER — VANCOMYCIN HCL 1500 MG/300ML IV SOLN
1500.0000 mg | Freq: Once | INTRAVENOUS | Status: AC
  Administered 2021-10-03: 1500 mg via INTRAVENOUS
  Filled 2021-10-03: qty 300

## 2021-10-03 NOTE — Progress Notes (Signed)
[facility] Progress Note  Subjective: pt seen in ICU, I/O yest +902 cc. UOP 425 cc.   Vitals:   10/03/21 1300 10/03/21 1400 10/03/21 1500 10/03/21 1522  BP:      Pulse: 95 76 99   Resp: (!) 22 (!) 23 (!) 23   Temp: 97.9 F (36.6 C) 98.4 F (36.9 C) 98.8 F (37.1 C)   TempSrc:      SpO2: 93% 95% 95% 95%  Weight:      Height:        Exam: on vent ,sedated  no jvd  throat ett in place  Chest cta bilat and lat  Cor reg no RG  Abd soft ntnd no ascites   Ext diffuse mild-mod pitting edema of UE's and LE's   Neuro on vent and sedated, not following commands    R fem HD cath and a-line in place     UA 10/02 - mod Hb, prot 100, 6-10 rbcs, 0-5 wbcs   Renal US 10/03 - 10 cm kidneys bilat w/o hydro, normal echotexture  CXR : IMPRESSION: 1. Interval placement of right subclavian central venous catheter with tip in the projection of the SVC. No pneumothorax. 2. Decreased aeration to the retrocardiac left lung base   Assessment/ Plan: AKI on CKD stage 3-  b/l creatinine is 1.42- 1.92 from July 2022, egfr 41-58 ml/min. AKI presumably due to cardiogenic shock/ cardiorenal syndrome.  UA unremarkable. [place] w/o signs of obstruction.  LVEF around 20%, multiple previous admissions for low cardiac output heart failure. On levo/ vaso gtt's and IV dobutamine due to shock. Started CRRT on 10/3. Not improving from shock standpoint, remains on pressors and inotropes. ECHO pending today. Prognosis very poor. Making some urine 450 cc yest. Will hold CRRT over the weekend and see if there is any recovery of renal function. Doubt LTAC will take patient on CRRT. Will follow.  Acute on chronic systolic and diastolic CHF - not able to pull any fluid.  Cardiogenic shock - remains on dobutamine and 2 pressors Substance abuse CAD sp STEMI Tobacco use COPD     Rob Amilyah Nack 10/03/2021, 3:43 PM   Recent Labs  Lab 10/02/21 1413 10/02/21 1636 10/03/21 0550 10/03/21 0940  K  --  4.5 4.3   --   BUN  --  39* 37*  --   CREATININE  --  0.99 0.94  --   CALCIUM  --  7.6* 7.7*  --   PHOS  --  2.7 2.9  --   HGB 11.4*  --   --  11.5*    Inpatient medications:  calcium gluconate  1 g Intravenous Once   chlorhexidine gluconate (MEDLINE KIT)  15 mL Mouth Rinse BID   Chlorhexidine Gluconate Cloth  6 each Topical Daily   dextrose  50 mL Intravenous Once   docusate  100 mg Per Tube BID   feeding supplement (PROSource TF)  90 mL Per Tube QID   hydrocortisone sod succinate (SOLU-CORTEF) inj  100 mg Intravenous Q12H   insulin aspart  0-6 Units Subcutaneous Q4H   mouth rinse  15 mL Mouth Rinse 10 times per day   pantoprazole (PROTONIX) IV  40 mg Intravenous Q24H   polyethylene glycol  17 g Per Tube Daily   sodium bicarbonate  150 mEq Intravenous Once   sodium chloride flush  10-40 mL Intracatheter Q12H     prismasol BGK 4/2.5 500 mL/hr at 10/03/21 0233    prismasol BGK 4/2.5 500  mL/hr at 10/03/21 0230   sodium chloride Stopped (10/01/21 2021)   sodium chloride 20 mL/hr at 10/03/21 1500   DOBUTamine 7.5 mcg/kg/min (10/03/21 1500)   feeding supplement (VITAL 1.5 CAL) 1,000 mL (10/02/21 2015)   fentaNYL infusion INTRAVENOUS 75 mcg/hr (10/03/21 1500)   meropenem (MERREM) IV Stopped (10/03/21 1015)   norepinephrine (LEVOPHED) Adult infusion 20 mcg/min (10/03/21 1532)   prismasol BGK 4/2.5 1,200 mL/hr at 10/03/21 1425   [START ON 10/04/2021] vancomycin     vancomycin     vasopressin 0.03 Units/min (10/03/21 1533)   fentaNYL (SUBLIMAZE) injection, heparin, sodium chloride flush

## 2021-10-03 NOTE — Progress Notes (Signed)
Pharmacy Antibiotic Note  Tyler Wong is a 55 y.o. male admitted on 10/25/2021.  Pharmacy had been consulted for Zosyn dosing, but due to ongoing thrombocytopenia, pharmacy was consulted 10/5 for meropenem dosing for suspected intra-abdominal infection, and today adding vancomycin for cellulitis (no notes yet to provide further detail)  Today, 10/03/2021: Patient remains on CRRT WBC mildly elevated, stable (on stress-dose HC) Remains afebrile  Plan: Continue meropenem 1g IV q8h Vancomycin 1500 mg IV x 1, followed by 1000 mg IV q24 hr while on CRRT F/u CRRT for pauses and effluent rate (goal 25-30 ml/kg/hr)   Height: 5\' 11"  (180.3 cm) Weight: 74.9 kg (165 lb 2 oz) IBW/kg (Calculated) : 75.3  Temp (24hrs), Avg:98.3 F (36.8 C), Min:96.4 F (35.8 C), Max:98.8 F (37.1 C)  Recent Labs  Lab 09/29/21 0257 09/29/21 0520 09/30/21 0542 09/30/21 0907 09/30/21 1706 10/01/21 0510 10/01/21 1212 10/01/21 1600 10/01/21 1700 10/01/21 2035 10/02/21 0524 10/02/21 0825 10/02/21 1413 10/02/21 1636 10/03/21 0550 10/03/21 0940  WBC  --    < >  --   --   --   --    < > 15.1*  --  15.0*  --  12.6* 12.0*  --   --  12.2*  CREATININE  --    < > 2.25*  --    < > 1.36*  --  1.20  --   --  1.04  --   --  0.99 0.94  --   LATICACIDVEN >9.0*  --   --  1.9  --  3.0*  --   --  1.9  --  1.4  --   --   --   --   --   VANCORANDOM  --   --  11  --   --   --   --   --   --   --   --   --   --   --   --   --    < > = values in this interval not displayed.     Estimated Creatinine Clearance: 94.1 mL/min (by C-G formula based on SCr of 0.94 mg/dL).    No Known Allergies  Antimicrobials this admission: 10/2 Ceftriaxone x 1 10/2 Azithromycin x 1 10/2 Vancomycin >> 10/3 10/2 Cefepime >>10/3 10/3 Zosyn >> 10/5 10/5 meropenem >>  10/7 vancomycin >>   Dose adjustments this admission:   Microbiology results: 10/2 BCx:  ngtd 10/2 UCx:   ngF 10/3 MRSA PCR: not detected  Thank you for allowing pharmacy  to be a part of this patient's care.  Reuel Boom, PharmD, BCPS [phone] 10/03/2021, 4:17 PM

## 2021-10-03 NOTE — Progress Notes (Signed)
NAME:  Tyler Wong, MRN:  [id number], DOB:  [birth year], LOS: 5 ADMISSION DATE:  09/27/2021, CONSULTATION DATE:  10/25/2021 REFERRING MD:  , CHIEF COMPLAINT:  SOB   BRIEF  55 year old male with history of Gastric cancer, HTN, DM type II, CAD, HFrEF (20%), Polysubstance abuse CKD stage 3b, arrived via EMS due to shortness of breath he was found to be hypothermic and hypoglycemic he was sitting in the rain in the cold for a few hour. On arrival: hypotensive with systolic of 79/39 with labs showing acute on chronic kidney disease with potassium of 5.9.  Patient was acidotic with pH of 7.27 and bicarb of 8.  Initial exam: agitated tachypneic he was on Levophed 40 mics and is being treated for hyperkalemia.  Patient was evaluated by nephrology and will continue medical management and possible need for CRRT if no improvement.  Patient was agitated very hard to get any history from.  Pertinent  Medical History  -Gastric cancer;  CR w/ Chemo. Lost to follow up  -Hypertension -Diabetes mellitus -CAD -Congestive heart failure ejection fraction 20% -Cocaine and polysubstance abuse -Diabetes mellitus -Cachexia lean body mass index -Polysubstance abuse with cocaine benzodiazepine and marijuana positivity multiple occasions  Significant Hospital Events: Including procedures, antibiotic start and stop dates in addition to other pertinent events   10/2: admit to inpatient.  Urine toxicology positive for cocaine 10/3: vanc and cefepime started. CVC, A line, and HD catheters placed. Start CRRT. Vasopressors x2 in addition to inotrope support  Goals of care discussed with family. Remains Full code per patient's wishes with family members PTA. 10/4: Continue CRRT, vasopressors x2 still high dose. Dobutamine at 7.5; lactic acid down to 1.9  10/5: wean off propofol. Continue CRRT. Co-ox dropping. Cvp and exam suggest euvolemic so changed volume goal to +50 cc an hour 10/6: TTE LVEF <20%, severely decreased  function with global hypokinesis and severe dilation.  Grade 2 diastolic dysfunction.  Moderate decreased RV function with normal size.  Moderately elevated PASP 52.5 mmHg.  Severely dilated left and right atria 10/7: CT head -no acute intracranial abnormality, chronic right cerebral and cerebellar infarct.  Minimally displaced fracture of the left zygomatic arch  Interim History / Subjective:  FiO2 0.30, PEEP 5 Dobutamine 10 Norepi 24 Vasopressin 0.03 Fentanyl 75 I/O+ 2.9 L total, UOP 413 cc/24h (improved)   Objective   Blood pressure 104/66, pulse 94, temperature 98.6 F (37 C), resp. rate 20, height 5\' 11"  (1.803 m), weight 74.9 kg, SpO2 98 %. CVP:  [9 mmHg-35 mmHg] 11 mmHg  Vent Mode: PRVC FiO2 (%):  [30 %] 30 % Set Rate:  [20 bmp] 20 bmp Vt Set:  [450 mL] 450 mL PEEP:  [5 cmH20] 5 cmH20 Plateau Pressure:  [14 cmH20-17 cmH20] 17 cmH20   Intake/Output Summary (Last 24 hours) at 10/03/2021 0755 Last data filed at 10/03/2021 0700 Gross per 24 hour  Intake 4056.73 ml  Output 2857 ml  Net 1199.73 ml   Filed Weights   10/01/21 0500 10/02/21 0500 10/03/21 0414  Weight: 74.1 kg 74.3 kg 74.9 kg    Examination: General Appearance: Cachectic man, intubated, sedated HEENT: Some temporal wasting, ET tube in place, oropharynx otherwise clear Neck: Difficult to assess JVP Lungs: Distant, clear bilaterally, no crackles or wheezes Heart: Distant, no murmur Abdomen: Nondistended, positive bowel sounds Extremities: 1-2+ edema bilateral lower extremities Skin: No rash, no wounds Neurologic: Sedated, opens eyes to stimulation, tracks, has intermittently followed some commands    Resolved Hospital Problem  list   Hyperglycemia Lactic acidosis (resolved 10/4) Life threatening hyperkalemia (resolved 10/4)  Assessment & Plan:  Cardiogenic shock (POA) w/ progressive Multiple Organ Failure.  Consider also possible septic shock given elevated procalcitonin but no clear source.  Has been  treated empirically -Remains on norepinephrine 24, vasopressin, titrate down as able with goal MAP 65 -Dobutamine 10, consider decrease 10/7 and follow SBP, Co. oximetry -Goal I/O to keep even > change UF on CVVH 10/7 -Continue stress dose hydrocortisone (note elevated cortisol likely drawn while on steroids) -Home Entresto, metolazone, spironolactone, BiDil are all on hold -Empiric antibiotics, all culture data negative to date, continue to follow.  Currently meropenem alone.  Would plan for 7 days empiric therapy  Acute respiratory failure present on admission secondary to end organ hypoperfusion and profound metabolic acidosis - present on admit -Continue PRVC, hopefully will be able to push for some spontaneous breathing trials if we can decrease sedating medication, improve his hemodynamics.  No progress with this thus far. -VAP prevention order set -Sedation per PAD protocol as below -Follow intermittent chest x-ray  Acute on chronic renal failure (CKD stage 3b)  - present on admit.  Remains on CVVHD -Appreciate nephrology assistance in management of CVVH, plan volume removal = -Follow BMP, electrolytes and replace as indicated -Follow urine output, over 400 cc last 24 hours, hopefully there may be some chance for renal recovery  Probable bowel ischemia/ischemic colitis in the setting of organ hypoperfusion and profound lactic acidosis, further supported by CT abdomen findings-->lactate has cleared.  Tolerating trickle tube feeding -Note empiric meropenem as above -Continue tube feeding, consider advance as well-tolerated  Acute metabolic encephalopathy in setting of acidosis, shock, also could be element of toxic encephalopathy due to polysubstance abuse +/- wd.  Current contribution from his necessary sedating medications -CT head 10/7 reassuring -Fentanyl infusion per PAD protocol, propofol is now off.  RASS goal -1 to -2 -Daily wake-up assessment  Coagulopathy/DIC due to  critical illness, possible sepsis (unclear source) with associated thrombocytopenia, present on admission.  Consider medication effect.  HITT negative -Anticoagulation, ASA all on hold -Lower extremity Doppler ultrasound negative for DVT -Follow CBC, pending on 10/7.  Most recent plt 15k on 10/6 -Transfuse platelets for any evidence of active bleeding, or < 10k  Anemia or illness - onset of 09/29/21.  Without any evidence of active bleeding -Transfusion goal hemoglobin 7.0 unless active bleeding noted  DM type 2 w/ hyperglycemia v. Hypoglycemia - CBGs continue to fluctuate.  -Following CBG closely, anticipate some variability as tube feedings are uptitrated -Sliding scale insulin as per protocol   Best Practice (right click and "Reselect all SmartList Selections" daily)   Diet/type: tubefeeds DVT prophylaxis: SCD GI prophylaxis: PPI Lines: Central line, Dialysis Catheter, and Arterial Line Foley:  Yes, and it is still needed Code Status:  full code Last date of multidisciplinary goals of care discussion 10/6   - [phone] Angelia Gooding Sister DPOA  -> Dr Chase Caller explained prognosis and heading towards LTAC. Recommended DNR in case of arrest. Sister said that patient always wanted CPR  - substrituted judgement. Angelia says she is a Nurse, learning disability and she understands in best interest patient should be no CPR but says other sister would not need more support in agreeing to DNR. For now full code Family: Dr Lamonte Sakai called and updated patient's sister Angelia by phone 10/7    [place]   The patient Tyler Wong is critically ill with multiple organ systems  failure and requires high complexity decision making for assessment and support, frequent evaluation and titration of therapies, application of advanced monitoring technologies and extensive interpretation of multiple databases.   Independent CC Time 45 minutes  Baltazar Apo, MD, PhD 10/03/2021, 9:05 AM [facility] [phone] or if no answer before 7:00PM call [phone] For any issues after 7:00PM please call [place]  Lab 10/19/2021 1955 09/27/2021 2122 10/12/2021 2122 10/18/2021 2310 09/29/21 0120 09/29/21 1252 09/29/21 2033 09/30/21 1100 10/01/21 0801 10/01/21 1212 10/01/21 1505 10/02/21 0524 10/02/21 1636  PHART  --   --   --  7.123* 7.218*  --  7.506* 7.438  --   --   --   --   --   PCO2ART  --   --   --  36.4 33.7  --  31.2* 34.9  --   --   --   --   --   PO2ART  --   --   --  300* 190*  --  184* 180*  --   --   --   --   --   HCO3  --  12.2*  --  11.4* 13.5*  --  24.5 23.2  --   --   --   --   --   TCO2 13*  --   --   --   --   --   --   --   --   --   --   --   --   O2SAT  --  69.3   < > 98.9 99.0   < > 99.9 99.5 63.4 62.9 68.8 61.8 77.5   < > = values in this interval not displayed.    CBC Recent Labs  Lab 10/01/21 2035 10/02/21 0825 10/02/21 1413  HGB 12.1* 11.6* 11.4*  HCT 34.9* 34.0* 34.1*  WBC 15.0* 12.6* 12.0*  PLT 16* 15* 15*    COAGULATION Recent Labs  Lab 09/29/2021 1923 09/30/21 1403  INR 3.3* 2.3*    CARDIAC  No results for input(s): TROPONINI in the last 168 hours. No results for input(s): PROBNP in the last 168 hours.   CHEMISTRY Recent Labs  Lab 09/30/21 1706 10/01/21 0510 10/01/21 1600 10/01/21 1620 10/02/21 0524 10/02/21 1636 10/03/21 0550  NA 136 135 142  --  134* 135 134*  K 4.9 4.9 4.8  --  4.6 4.5 4.3  CL 103 104 109  --  103 103 105  CO2 23 19* 25  --  26 26 26   GLUCOSE 143* 201* 244*  --  205* 211* 221*  BUN 46* 40* 40*  --  40* 39* 37*  CREATININE 1.83* 1.36* 1.20  --  1.04 0.99 0.94  CALCIUM 7.8* 8.3* 8.4*  --  7.7* 7.6* 7.7*  MG 2.5* 2.5*  --  2.7* 2.6*  --  2.5*  PHOS 3.8  3.7 4.6  4.4 3.2 3.3 2.9 2.7 2.9   Estimated Creatinine Clearance: 94.1 mL/min (by C-G formula based on SCr of 0.94 mg/dL).   LIVER Recent Labs  Lab 09/29/2021 1853  10/08/2021 1923 09/29/21 1549 09/29/21 2032 09/30/21 0542 09/30/21 1403 09/30/21 1706 10/01/21 0510 10/01/21 1600 10/02/21 0524 10/02/21 1636 10/03/21 0550  AST 116*  --   --  128*  --   --   --   --   --   --   --   --  ALT 80*  --   --  64*  --   --   --   --   --   --   --   --   ALKPHOS 161*  --   --  120  --   --   --   --   --   --   --   --   BILITOT 7.0*  --   --  5.1*  --   --   --   --   --   --   --   --   PROT 7.4  --   --  5.7*  --   --   --   --   --   --   --   --   ALBUMIN 3.3*  --    < > 2.4*   < >  --    < > 2.4* 2.2* 2.2* 2.1* 2.1*  INR  --  3.3*  --   --   --  2.3*  --   --   --   --   --   --    < > = values in this interval not displayed.     INFECTIOUS Recent Labs  Lab 09/29/21 1252 09/30/21 0542 09/30/21 0907 10/01/21 0510 10/01/21 1700 10/02/21 0524  LATICACIDVEN  --   --    < > 3.0* 1.9 1.4  PROCALCITON 23.28 11.71  --  6.18  --   --    < > = values in this interval not displayed.     ENDOCRINE CBG (last 3)  Recent Labs    10/03/21 0009 10/03/21 0413 10/03/21 0717  GLUCAP 209* 192* 204*

## 2021-10-03 NOTE — Progress Notes (Signed)
CRRT d/c per order a total of 217 was returned to the patient.

## 2021-10-03 NOTE — Progress Notes (Signed)
Inpatient Diabetes Program Recommendations  AACE/ADA: New Consensus Statement on Inpatient Glycemic Control   Target Ranges:  Prepandial:   less than 140 mg/dL      Peak postprandial:   less than 180 mg/dL (1-2 hours)      Critically ill patients:  140 - 180 mg/dL    Review of Glycemic Control  Results for Tyler Wong, Tyler Wong (MRN [id number]) as of 10/03/2021 09:30  Ref. Range 10/02/2021 07:21 10/02/2021 12:12 10/02/2021 16:32 10/02/2021 19:34 10/03/2021 00:09 10/03/2021 04:13 10/03/2021 07:17  Glucose-Capillary Latest Ref Range: 70 - 99 mg/dL 197 (H) 192 (H) 180 (H) 189 (H) 209 (H) 192 (H) 204 (H)   Diabetes history: DM2 Outpatient Diabetes medications: Jardiance 10 mg daily Current orders for Inpatient glycemic control:  Novolog 0-6 units Q4H  Solucortef 100 mg Q12H Vital @ 50 ml/hr  Inpatient Diabetes Program Recommendations:    -  Consider ordering Novolog 2 units Q4H for tube feeding coverage. If tube feeding is stopped or held then Novolog tube feeding coverage should also be stopped or held.  Thanks, Tama Headings RN, MSN, BC-ADM Inpatient Diabetes Coordinator Team Pager [phone] (8a-5p)

## 2021-10-03 NOTE — Progress Notes (Signed)
Patient returned from [facility] [place] safely. VSS, will reassess and continue with scheduled therapies.

## 2021-10-04 DIAGNOSIS — J9601 Acute respiratory failure with hypoxia: Secondary | ICD-10-CM | POA: Diagnosis not present

## 2021-10-04 DIAGNOSIS — R579 Shock, unspecified: Secondary | ICD-10-CM | POA: Diagnosis not present

## 2021-10-04 DIAGNOSIS — N179 Acute kidney failure, unspecified: Secondary | ICD-10-CM | POA: Diagnosis not present

## 2021-10-04 DIAGNOSIS — R570 Cardiogenic shock: Secondary | ICD-10-CM | POA: Diagnosis not present

## 2021-10-04 LAB — CBC
HCT: 33.5 % — ABNORMAL LOW (ref 39.0–52.0)
Hemoglobin: 11.2 g/dL — ABNORMAL LOW (ref 13.0–17.0)
MCH: 29.9 pg (ref 26.0–34.0)
MCHC: 33.4 g/dL (ref 30.0–36.0)
MCV: 89.3 fL (ref 80.0–100.0)
Platelets: 20 10*3/uL — CL (ref 150–400)
RBC: 3.75 MIL/uL — ABNORMAL LOW (ref 4.22–5.81)
RDW: 22.5 % — ABNORMAL HIGH (ref 11.5–15.5)
WBC: 11.4 10*3/uL — ABNORMAL HIGH (ref 4.0–10.5)
nRBC: 0.6 % — ABNORMAL HIGH (ref 0.0–0.2)

## 2021-10-04 LAB — BLOOD GAS, ARTERIAL
Acid-Base Excess: 0 mmol/L (ref 0.0–2.0)
Acid-base deficit: 0.1 mmol/L (ref 0.0–2.0)
Bicarbonate: 25.9 mmol/L (ref 20.0–28.0)
Bicarbonate: 25.1 mmol/L (ref 20.0–28.0)
FIO2: 30
O2 Saturation: 78.2 %
O2 Saturation: 97.4 %
Patient temperature: 98.6
Patient temperature: 98.6
pCO2 arterial: 50.8 mmHg — ABNORMAL HIGH (ref 32.0–48.0)
pCO2 arterial: 45.8 mmHg (ref 32.0–48.0)
pH, Arterial: 7.329 — ABNORMAL LOW (ref 7.350–7.450)
pH, Arterial: 7.358 (ref 7.350–7.450)
pO2, Arterial: 49.6 mmHg — ABNORMAL LOW (ref 83.0–108.0)
pO2, Arterial: 98.8 mmHg (ref 83.0–108.0)

## 2021-10-04 LAB — COOXEMETRY PANEL
Carboxyhemoglobin: 1.7 % — ABNORMAL HIGH (ref 0.5–1.5)
Carboxyhemoglobin: 1.6 % — ABNORMAL HIGH (ref 0.5–1.5)
Methemoglobin: 0.9 % (ref 0.0–1.5)
Methemoglobin: 1 % (ref 0.0–1.5)
O2 Saturation: 77.5 %
O2 Saturation: 64.2 %
Total hemoglobin: 11.2 g/dL — ABNORMAL LOW (ref 12.0–16.0)
Total hemoglobin: 11.3 g/dL — ABNORMAL LOW (ref 12.0–16.0)

## 2021-10-04 LAB — RENAL FUNCTION PANEL
Albumin: 2.2 g/dL — ABNORMAL LOW (ref 3.5–5.0)
Anion gap: 6 (ref 5–15)
BUN: 47 mg/dL — ABNORMAL HIGH (ref 6–20)
CO2: 27 mmol/L (ref 22–32)
Calcium: 8.1 mg/dL — ABNORMAL LOW (ref 8.9–10.3)
Chloride: 109 mmol/L (ref 98–111)
Creatinine, Ser: 1.2 mg/dL (ref 0.61–1.24)
GFR, Estimated: >60 mL/min (ref >60)
Glucose, Bld: 205 mg/dL — ABNORMAL HIGH (ref 70–99)
Phosphorus: 3.8 mg/dL (ref 2.5–4.6)
Potassium: 4.3 mmol/L (ref 3.5–5.1)
Sodium: 142 mmol/L (ref 135–145)

## 2021-10-04 LAB — GLUCOSE, CAPILLARY
Glucose-Capillary: 116 mg/dL — ABNORMAL HIGH (ref 70–99)
Glucose-Capillary: 161 mg/dL — ABNORMAL HIGH (ref 70–99)
Glucose-Capillary: 166 mg/dL — ABNORMAL HIGH (ref 70–99)
Glucose-Capillary: 166 mg/dL — ABNORMAL HIGH (ref 70–99)
Glucose-Capillary: 178 mg/dL — ABNORMAL HIGH (ref 70–99)
Glucose-Capillary: 205 mg/dL — ABNORMAL HIGH (ref 70–99)
Glucose-Capillary: 209 mg/dL — ABNORMAL HIGH (ref 70–99)

## 2021-10-04 LAB — BASIC METABOLIC PANEL
Anion gap: 7 (ref 5–15)
BUN: 47 mg/dL — ABNORMAL HIGH (ref 6–20)
CO2: 26 mmol/L (ref 22–32)
Calcium: 7.8 mg/dL — ABNORMAL LOW (ref 8.9–10.3)
Chloride: 104 mmol/L (ref 98–111)
Creatinine, Ser: 1.12 mg/dL (ref 0.61–1.24)
GFR, Estimated: >60 mL/min (ref >60)
Glucose, Bld: 200 mg/dL — ABNORMAL HIGH (ref 70–99)
Potassium: 4.2 mmol/L (ref 3.5–5.1)
Sodium: 137 mmol/L (ref 135–145)

## 2021-10-04 LAB — PHOSPHORUS: Phosphorus: 3.6 mg/dL (ref 2.5–4.6)

## 2021-10-04 LAB — CULTURE, BLOOD (ROUTINE X 2)
Culture: NO GROWTH
Culture: NO GROWTH
Special Requests: ADEQUATE
Special Requests: ADEQUATE

## 2021-10-04 LAB — LACTIC ACID, PLASMA: Lactic Acid, Venous: 1.2 mmol/L (ref 0.5–1.9)

## 2021-10-04 LAB — MAGNESIUM: Magnesium: 2.4 mg/dL (ref 1.7–2.4)

## 2021-10-04 MED ORDER — FUROSEMIDE 10 MG/ML IJ SOLN
60.0000 mg | Freq: Once | INTRAMUSCULAR | Status: AC
  Administered 2021-10-04: 60 mg via INTRAVENOUS
  Filled 2021-10-04: qty 6

## 2021-10-04 MED ORDER — AMIODARONE HCL IN DEXTROSE 360-4.14 MG/200ML-% IV SOLN: 30.0000 mg/h | INTRAVENOUS | Status: DC

## 2021-10-04 MED ORDER — SODIUM CHLORIDE 0.9 % IV SOLN
1.0000 g | Freq: Two times a day (BID) | INTRAVENOUS | Status: DC
  Administered 2021-10-04 (×2): 1 g via INTRAVENOUS
  Filled 2021-10-04 (×3): qty 1

## 2021-10-04 MED ORDER — VANCOMYCIN VARIABLE DOSE PER UNSTABLE RENAL FUNCTION (PHARMACIST DOSING): Status: DC

## 2021-10-04 MED ORDER — AMIODARONE HCL IN DEXTROSE 360-4.14 MG/200ML-% IV SOLN
60.0000 mg/h | INTRAVENOUS | Status: AC
  Administered 2021-10-04: 60 mg/h via INTRAVENOUS
  Filled 2021-10-04 (×2): qty 200

## 2021-10-04 MED ORDER — AMIODARONE LOAD VIA INFUSION
150.0000 mg | Freq: Once | INTRAVENOUS | Status: AC
  Administered 2021-10-04: 150 mg via INTRAVENOUS
  Filled 2021-10-04: qty 83.34

## 2021-10-04 NOTE — Progress Notes (Signed)
At approximately 0500 hrs., pt had 26-beat run [place], confirmed with central telemetry. [place] notified. Dr. Oletta Darter placed order for Amiodarone bolus/continuous drip (see MAR).

## 2021-10-04 NOTE — Progress Notes (Signed)
[facility] Progress Note Patient Name: Christofer Shen DOB: 28-Jul-1966 MRN: [id number]   Date of Service  10/04/2021  HPI/Events of Note  Multiple issues: 1. 6 Beat run of NSVT and 2. CVP elevated from 14 --> 20 post being turned. CRRT stopped running this evening.   eICU Interventions  Plan: Send AM labs early at 2 AM. Lasix 60 mg IV X 1 now.      Intervention Category Major Interventions: Other:;Arrhythmia - evaluation and management  Jullisa Grigoryan Cornelia Copa 10/04/2021, 12:49 AM

## 2021-10-04 NOTE — Plan of Care (Signed)
PCCM Note  Family meeting held in the ICU with patient's sisters, other family.  Updated them on his current condition, organ system injuries, critical care interventions including mechanical ventilation, pressors, medications.  The goal is a recovery to independence out-of-hospital.  I explained that the prognosis for this is very guarded but is still possible.  The believe that the patient's wishes would be to receive all aggressive care and interventions that could give him a chance to recover to independence.  They are less sure that he would want tracheostomy, prolonged ICU or LTAC care.  We would need to discuss such a course further depending on degree of improvement, stalled improvement, decline.   I did recommend that I do not believe Tyler Wong would survive CPR, would not have a chance for meaningful recovery if he required CPR.  They had some understandable hesitation about this issue because the patient had stated in the past that he would want CPR if he could save him.  I explained to him the current circumstances CPR likely could not save him but that it could be harmful.  They voiced understanding.  I will place no CPR orders on the chart.  Independent CC time 50 minutes   Baltazar Apo, MD, PhD 10/04/2021, 4:35 PM [facility] [phone] or if no answer before 7:00PM call [phone] For any issues after 7:00PM please call eLink [phone]

## 2021-10-04 NOTE — Progress Notes (Signed)
Tyler Wong Progress Note  Subjective: pt seen in ICU, UOP improved significantly. CRRT dc'd last night. 1.2 L out today already.  Pressors down as well.   Vitals:   10/04/21 1100 10/04/21 1200 10/04/21 1209 10/04/21 1300  BP: 94/64 (!) 87/59  (!) 88/54  Pulse: 79 79  76  Resp: 14 (!) 21  20  Temp: 98.4 F (36.9 C) 98.1 F (36.7 C)  (!) 97.5 F (36.4 C)  TempSrc:      SpO2: 100% 99% 100% 98%  Weight:      Height:        Exam: on vent ,sedated  no jvd  throat ett in place  Chest cta bilat and lat  Cor reg no RG  Abd soft ntnd no ascites   Ext diffuse mild-mod pitting edema of UE's and LE's   Neuro on vent and sedated, not following commands    R fem HD cath and a-line in place     UA 10/02 - mod Hb, prot 100, 6-10 rbcs, 0-5 wbcs   Renal US 10/03 - 10 cm kidneys bilat w/o hydro, normal echotexture  CXR : IMPRESSION: 1. Interval placement of right subclavian central venous catheter with tip in the projection of the SVC. No pneumothorax. 2. Decreased aeration to the retrocardiac left lung base   Assessment/ Plan: AKI on CKD stage 3-  b/l creatinine is 1.42- 1.92 from July 2022, egfr 41-58 ml/min. AKI presumably due to cardiogenic shock/ cardiorenal syndrome.  UA unremarkable. [place] w/o signs of obstruction.  LVEF around 20%, multiple previous admissions for low cardiac output heart failure. On levo/ vaso gtt's and IV dobutamine due to shock. Started CRRT on 10/3. Not improving from shock standpoint, remains on pressors and inotropes. CRRT dc'd 10/7 for trial to see if there is recovery of renal function. Very good UOP, improved today.  A/C combined s/d CHF- w/ diffuse edema of the extremities, wt's up a bit, hold off on lasix for now Cardiogenic shock - remains on dobutamine but pressors are weaning down Substance abuse CAD sp STEMI Tobacco use COPD     Tyler Wong 10/04/2021, 1:39 PM   Recent Labs  Lab 10/03/21 0940 10/03/21 1550 10/04/21 0200  K  --   4.3 4.3  4.2  BUN  --  37* 47*  47*  CREATININE  --  1.02 1.20  1.12  CALCIUM  --  7.5* 8.1*  7.8*  PHOS  --  2.8 3.8  3.6  HGB 11.5*  --  11.2*    Inpatient medications:  calcium gluconate  1 g Intravenous Once   chlorhexidine gluconate (MEDLINE KIT)  15 mL Mouth Rinse BID   Chlorhexidine Gluconate Cloth  6 each Topical Daily   dextrose  50 mL Intravenous Once   docusate  100 mg Per Tube BID   feeding supplement (PROSource TF)  90 mL Per Tube QID   hydrocortisone sod succinate (SOLU-CORTEF) inj  100 mg Intravenous Q12H   insulin aspart  0-6 Units Subcutaneous Q4H   mouth rinse  15 mL Mouth Rinse 10 times per day   pantoprazole (PROTONIX) IV  40 mg Intravenous Q24H   polyethylene glycol  17 g Per Tube Daily   sodium chloride flush  10-40 mL Intracatheter Q12H   vancomycin variable dose per unstable renal function (pharmacist dosing)   Does not apply See admin instructions    sodium chloride Stopped (10/01/21 2021)   sodium chloride 20 mL/hr at 10/04/21 1220  DOBUTamine 5 mcg/kg/min (10/04/21 1220)   feeding supplement (VITAL 1.5 CAL) 50 mL/hr at 10/04/21 0756   fentaNYL infusion INTRAVENOUS 100 mcg/hr (10/04/21 1220)   meropenem (MERREM) IV 200 mL/hr at 10/04/21 1220   norepinephrine (LEVOPHED) Adult infusion 5 mcg/min (10/04/21 1220)   vasopressin Stopped (10/04/21 1217)   fentaNYL (SUBLIMAZE) injection, sodium chloride flush

## 2021-10-04 NOTE — Progress Notes (Signed)
NAME:  Tyler Wong, MRN:  [id number], DOB:  [birth year], LOS: 20 ADMISSION DATE:  10/01/2021, CONSULTATION DATE:  10/07/2021 REFERRING MD:  , CHIEF COMPLAINT:  SOB   BRIEF  55 year old male with history of Gastric cancer, HTN, DM type II, CAD, HFrEF (20%), Polysubstance abuse CKD stage 3b, arrived via EMS due to shortness of breath he was found to be hypothermic and hypoglycemic he was sitting in the rain in the cold for a few hour. On arrival: hypotensive with systolic of 94/80 with labs showing acute on chronic kidney disease with potassium of 5.9.  Patient was acidotic with pH of 7.27 and bicarb of 8.  Initial exam: agitated tachypneic he was on Levophed 40 mics and is being treated for hyperkalemia.  Patient was evaluated by nephrology and will continue medical management and possible need for CRRT if no improvement.  Patient was agitated very hard to get any history from.  Pertinent  Medical History  -Gastric cancer;  CR w/ Chemo. Lost to follow up  -Hypertension -Diabetes mellitus -CAD -Congestive heart failure ejection fraction 20% -Cocaine and polysubstance abuse -Diabetes mellitus -Cachexia lean body mass index -Polysubstance abuse with cocaine benzodiazepine and marijuana positivity multiple occasions  Significant Hospital Events: Including procedures, antibiotic start and stop dates in addition to other pertinent events   10/2: admit to inpatient.  Urine toxicology positive for cocaine 10/3: vanc and cefepime started. CVC, A line, and HD catheters placed. Start CRRT. Vasopressors x2 in addition to inotrope support  Goals of care discussed with family. Remains Full code per patient's wishes with family members PTA. 10/4: Continue CRRT, vasopressors x2 still high dose. Dobutamine at 7.5; lactic acid down to 1.9  10/5: wean off propofol. Continue CRRT. Co-ox dropping. Cvp and exam suggest euvolemic so changed volume goal to +50 cc an hour 10/6: TTE LVEF <20%, severely decreased  function with global hypokinesis and severe dilation.  Grade 2 diastolic dysfunction.  Moderate decreased RV function with normal size.  Moderately elevated PASP 52.5 mmHg.  Severely dilated left and right atria 10/7: CT head -no acute intracranial abnormality, chronic right cerebral and cerebellar infarct.  Minimally displaced fracture of the left zygomatic arch 10/7: Dobutamine decreased 7.5, CVVH was stopped  Interim History / Subjective:   Empiric vancomycin added on 10/7 due to some upper extremity weeping and skin changes at IV site Tolerated decrease dobutamine to 7.5 hemodynamically Had a 26 beat run of VT vs SVT overnight, amiodarone bolus and infusion was started Co. oximetry 77.5% this morning after CVVH discontinued 10/7 Norepinephrine 20, vasopressin 0.03 Fentanyl 75 I/O+ 3.1 L total, UOP 1500 cc last 24 hours (lasix given x 1 o/n)   Objective   Blood pressure 97/66, pulse 92, temperature 98.4 F (36.9 C), resp. rate 20, height 5\' 11"  (1.803 m), weight 76.4 kg, SpO2 95 %. CVP:  [8 mmHg-34 mmHg] 20 mmHg  Vent Mode: PRVC FiO2 (%):  [30 %] 30 % Set Rate:  [20 bmp] 20 bmp Vt Set:  [450 mL] 450 mL PEEP:  [5 cmH20] 5 cmH20 Plateau Pressure:  [14 cmH20-16 cmH20] 15 cmH20   Intake/Output Summary (Last 24 hours) at 10/04/2021 0750 Last data filed at 10/04/2021 0646 Gross per 24 hour  Intake 3628.03 ml  Output 3422 ml  Net 206.03 ml   Filed Weights   10/02/21 0500 10/03/21 0414 10/04/21 0234  Weight: 74.3 kg 74.9 kg 76.4 kg    Examination: General Appearance: Cachectic ill-appearing man, intubated and sedated HEENT: ET tube  in place, temporal wasting, pupils equal, scleral icterus present Neck: No JVD Lungs: Distant, clear, no crackles or wheezes Heart: Distant, no murmur, regular Abdomen: Nondistended, positive bowel sounds Extremities: 2+ ankle edema bilaterally Skin: No rash, no wounds Neurologic: Sedated.  He did open his eyes to stimulation.  May have nodded to  questions, unclear.  Some spontaneous upper extremity movement.  Did not follow commands    Resolved Hospital Problem list   Hyperglycemia Lactic acidosis (resolved 10/4) Life threatening hyperkalemia (resolved 10/4)  Assessment & Plan:  Cardiogenic shock (POA) w/ progressive Multiple Organ Failure.  Consider also possible septic shock given elevated procalcitonin but no clear source.  Has been treated empirically SVT versus VT.  Suspect due to inotropic/chronotropic effects of dobutamine -Tolerated decrease dobutamine to 7.5 on 10/7.  Plan to decrease again to 5.0 on 10/8 and follow Co. oximetry, goal continue to wean. -Wean norepinephrine as able -Continue vasopressin for now, consider DC when norepinephrine needs are <10 -Goal to keep I=O -Continue stress dose hydrocortisone for now -Home Entresto, metolazone, spironolactone, BiDil are all on hold -All culture data negative, continue meropenem, vancomycin started empirically on 10/7 for possible upper extremity cellulitis.  Planning 7 days total antibiotics  Acute respiratory failure present on admission secondary to end organ hypoperfusion and profound metabolic acidosis - present on admit -PRVC.  Initiate SBT and PSV when stabilized hemodynamically, able to decrease sedating medication. -VAP prevention order set -Sedation as per PAD protocol -Follow intermittent chest x-ray  Acute on chronic renal failure (CKD stage 3b)  - present on admit.  CVVHD initiated 10/7 -Following urine output, BMP and hemodynamics off CVVH.  Hopefully he will not require further HD -Consider repeat Lasix depending on UOP, I/O, CVP  Probable bowel ischemia/ischemic colitis in the setting of organ hypoperfusion and profound lactic acidosis, further supported by CT abdomen findings-->lactate has cleared.  Tolerating trickle tube feeding -Empiric meropenem as above -Continue tube feeding, advance as able  Acute metabolic encephalopathy in setting of  acidosis, shock, also could be element of toxic encephalopathy due to polysubstance abuse +/- wd.  Current contribution from his necessary sedating medications -CT head 10/7 reassuring -Propofol is off.  Wean fentanyl as able.  RASS goal -1 -Daily wake-up assessment  Coagulopathy/DIC due to critical illness, possible sepsis (unclear source) with associated thrombocytopenia, present on admission.  Consider medication effect.  HITT negative -ASA on hold, anticoagulation on hold -Lower extremity Doppler ultrasound reassuring -Follow CBC, goal platelets > 10k as long as no evidence of bleeding. -Follow for any evidence of active bleeding  Anemia or illness - onset of 09/29/21.  Without any evidence of active bleeding -continue Transfusion goal hemoglobin 7.0 unless active bleeding noted  DM type 2 w/ hyperglycemia v. Hypoglycemia - CBGs continue to fluctuate.  -Following CBG closely, anticipate some variability as tube feedings are uptitrated -Sliding scale insulin as per protocol   Best Practice (right click and "Reselect all SmartList Selections" daily)   Diet/type: tubefeeds DVT prophylaxis: SCD GI prophylaxis: PPI Lines: Central line, Dialysis Catheter, and Arterial Line Foley:  Yes, and it is still needed Code Status:  full code Last date of multidisciplinary goals of care discussion 10/6   - [phone] Angelia Gooding Sister DPOA  -> Dr Chase Caller explained prognosis and heading towards LTAC. Recommended DNR in case of arrest. Sister said that patient always wanted CPR  - substrituted judgement. Angelia says she is a Nurse, learning disability and she understands in best interest patient should be  no CPR but says other sister would not need more support in agreeing to DNR. For now full code Family: Dr Lamonte Sakai called and updated patient's sister Angelia by phone 10/7. Planning to meet with pt' sisters today 10/8 at 15:30    [employer]   The patient Jasman Murri is critically ill  with multiple organ systems failure and requires high complexity decision making for assessment and support, frequent evaluation and titration of therapies, application of advanced monitoring technologies and extensive interpretation of multiple databases.   Independent CC Time 35 minutes  Baltazar Apo, MD, PhD 10/04/2021, 7:50 AM [facility] [phone] or if no answer before 7:00PM call [phone] For any issues after 7:00PM please call [facility]  Lab 09/30/2021 1955 10/05/2021 2122 09/29/21 0120 09/29/21 1252 09/29/21 2033 09/30/21 1100 10/01/21 0801 10/02/21 0524 10/02/21 1636 10/03/21 1204 10/04/21 0315 10/04/21 0432  PHART  --    < > 7.218*  --  7.506* 7.438  --   --   --   --  7.329* 7.358  PCO2ART  --    < > 33.7  --  31.2* 34.9  --   --   --   --  50.8* 45.8  PO2ART  --    < > 190*  --  184* 180*  --   --   --   --  49.6* 98.8  HCO3  --    < > 13.5*  --  24.5 23.2  --   --   --   --  25.9 25.1  TCO2 13*  --   --   --   --   --   --   --   --   --   --   --   O2SAT  --    < > 99.0   < > 99.9 99.5   < > 61.8 77.5 58.2 78.2  77.5 97.4   < > = values in this interval not displayed.    CBC Recent Labs  Lab 10/02/21 1413 10/03/21 0940 10/04/21 0200  HGB 11.4* 11.5* 11.2*  HCT 34.1* 34.4* 33.5*  WBC 12.0* 12.2* 11.4*  PLT 15* 16* 20*    COAGULATION Recent Labs  Lab 10/20/2021 1923 09/30/21 1403  INR 3.3* 2.3*    CARDIAC  No results for input(s): TROPONINI in the last 168 hours. No results for input(s): PROBNP in the last 168 hours.   CHEMISTRY Recent Labs  Lab 10/01/21 0510 10/01/21 1600 10/01/21 1620 10/02/21 0524 10/02/21 1636 10/03/21 0550 10/03/21 1550 10/04/21 0200  NA 135   < >  --  134* 135 134* 138 142  137  K 4.9   < >  --  4.6 4.5 4.3 4.3 4.3  4.2  CL 104   < >  --  103 103 105 106 109  104  CO2 19*   < >  --  26 26 26 26 27  26   GLUCOSE 201*   < >  --  205*  211* 221* 214* 205*  200*  BUN 40*   < >  --  40* 39* 37* 37* 47*  47*  CREATININE 1.36*   < >  --  1.04 0.99 0.94 1.02 1.20  1.12  CALCIUM 8.3*   < >  --  7.7* 7.6* 7.7* 7.5* 8.1*  7.8*  MG 2.5*  --  2.7* 2.6*  --  2.5*  --  2.4  PHOS 4.6  4.4   < > 3.3 2.9 2.7 2.9 2.8 3.8  3.6   < > = values in this interval not displayed.   Estimated Creatinine Clearance: 74.1 mL/min (by C-G formula based on SCr of 1.2 mg/dL).   LIVER Recent Labs  Lab 10/01/2021 1853 10/11/2021 1923 09/29/21 1549 09/29/21 2032 09/30/21 0542 09/30/21 1403 09/30/21 1706 10/02/21 0524 10/02/21 1636 10/03/21 0550 10/03/21 1550 10/04/21 0200  AST 116*  --   --  128*  --   --   --   --   --   --   --   --   ALT 80*  --   --  64*  --   --   --   --   --   --   --   --   ALKPHOS 161*  --   --  120  --   --   --   --   --   --   --   --   BILITOT 7.0*  --   --  5.1*  --   --   --   --   --   --   --   --   PROT 7.4  --   --  5.7*  --   --   --   --   --   --   --   --   ALBUMIN 3.3*  --    < > 2.4*   < >  --    < > 2.2* 2.1* 2.1* 2.1* 2.2*  INR  --  3.3*  --   --   --  2.3*  --   --   --   --   --   --    < > = values in this interval not displayed.     INFECTIOUS Recent Labs  Lab 09/29/21 1252 09/30/21 0542 09/30/21 0907 10/01/21 0510 10/01/21 1700 10/02/21 0524 10/04/21 0200  LATICACIDVEN  --   --    < > 3.0* 1.9 1.4 1.2  PROCALCITON 23.28 11.71  --  6.18  --   --   --    < > = values in this interval not displayed.     ENDOCRINE CBG (last 3)  Recent Labs    10/03/21 1951 10/04/21 0028 10/04/21 0409  GLUCAP 189* 166* 116*

## 2021-10-04 NOTE — Progress Notes (Signed)
[facility] Progress Note Patient Name: Tyler Wong DOB: 02/04/1966 MRN: [id number]   Date of Service  10/04/2021  HPI/Events of Note  Nursing reports 26 beat run of NSVT, Now in NSR with multifocal PVC's. K+ = 4.3 and Mg++ = 2.4. Patient is on both Dobutamine and Norepinephrine IV infusions which can both exacerbate ventricular ectopy.  LVEF estimated to be <20% on recent echo.   eICU Interventions  Plan: Amiodarone IV load and infusion.      Intervention Category Major Interventions: Arrhythmia - evaluation and management  [place] Cork Eugene 10/04/2021, 5:19 AM

## 2021-10-04 NOTE — Progress Notes (Addendum)
Pharmacy Antibiotic Note  Tyler Wong is a 55 y.o. male admitted on 10/09/2021.  Pharmacy had been consulted for Zosyn dosing, but due to ongoing thrombocytopenia, pharmacy was consulted 10/5 for meropenem dosing for suspected intra-abdominal infection, and vancomycin added for possible cellulitis  Today, 10/04/2021: CRRT discontinued 10/7 PM Patient with ESRD, will dose based on CrCl <30 mL/min WBC mildly elevated, stable (on stress-dose HC) Remains afebrile  Plan: Adjust meropenem to 1g IV q12 hours Discontinue scheduled vancomycin Check vancomycin random 10/9 AM Follow renal function for dose adjustment Follow up clinical improvement and abx LOT   Height: 5\' 11"  (180.3 cm) Weight: 76.4 kg (168 lb 6.9 oz) IBW/kg (Calculated) : 75.3  Temp (24hrs), Avg:97.9 F (36.6 C), Min:96.4 F (35.8 C), Max:98.8 F (37.1 C)  Recent Labs  Lab 09/30/21 0542 09/30/21 0907 09/30/21 1706 10/01/21 0510 10/01/21 1212 10/01/21 1700 10/01/21 2035 10/02/21 0524 10/02/21 0825 10/02/21 1413 10/02/21 1636 10/03/21 0550 10/03/21 0940 10/03/21 1550 10/04/21 0200  WBC  --   --   --   --    < >  --  15.0*  --  12.6* 12.0*  --   --  12.2*  --  11.4*  CREATININE 2.25*  --    < > 1.36*   < >  --   --  1.04  --   --  0.99 0.94  --  1.02 1.20  1.12  LATICACIDVEN  --  1.9  --  3.0*  --  1.9  --  1.4  --   --   --   --   --   --  1.2  VANCORANDOM 11  --   --   --   --   --   --   --   --   --   --   --   --   --   --    < > = values in this interval not displayed.     Estimated Creatinine Clearance: 74.1 mL/min (by C-G formula based on SCr of 1.2 mg/dL).    No Known Allergies  Antimicrobials this admission: 10/2 Ceftriaxone x 1 10/2 Azithromycin x 1 10/2 Vancomycin >> 10/3 10/2 Cefepime >>10/3 10/3 Zosyn >> 10/5 10/5 meropenem >>  10/7 vancomycin >>   Dose adjustments this admission:   Microbiology results: 10/2 BCx:  ngtd 10/2 UCx:   ngF 10/3 MRSA PCR: not detected  Thank you for  allowing pharmacy to be a part of this patient's care.  Dimple Nanas, PharmD 10/04/2021 8:24 AM

## 2021-10-04 NOTE — Progress Notes (Signed)
ABG collected and send down to lab, called lab.

## 2021-10-05 DIAGNOSIS — R570 Cardiogenic shock: Secondary | ICD-10-CM | POA: Diagnosis not present

## 2021-10-05 LAB — CBC
HCT: 31.6 % — ABNORMAL LOW (ref 39.0–52.0)
Hemoglobin: 10.7 g/dL — ABNORMAL LOW (ref 13.0–17.0)
MCH: 30 pg (ref 26.0–34.0)
MCHC: 33.9 g/dL (ref 30.0–36.0)
MCV: 88.5 fL (ref 80.0–100.0)
Platelets: 35 10*3/uL — ABNORMAL LOW (ref 150–400)
RBC: 3.57 MIL/uL — ABNORMAL LOW (ref 4.22–5.81)
RDW: 22.3 % — ABNORMAL HIGH (ref 11.5–15.5)
WBC: 10.6 10*3/uL — ABNORMAL HIGH (ref 4.0–10.5)
nRBC: 0.3 % — ABNORMAL HIGH (ref 0.0–0.2)

## 2021-10-05 LAB — RENAL FUNCTION PANEL
Albumin: 2.2 g/dL — ABNORMAL LOW (ref 3.5–5.0)
Anion gap: 7 (ref 5–15)
BUN: 69 mg/dL — ABNORMAL HIGH (ref 6–20)
CO2: 25 mmol/L (ref 22–32)
Calcium: 7.9 mg/dL — ABNORMAL LOW (ref 8.9–10.3)
Chloride: 110 mmol/L (ref 98–111)
Creatinine, Ser: 1.33 mg/dL — ABNORMAL HIGH (ref 0.61–1.24)
GFR, Estimated: >60 mL/min (ref >60)
Glucose, Bld: 201 mg/dL — ABNORMAL HIGH (ref 70–99)
Phosphorus: 4.2 mg/dL (ref 2.5–4.6)
Potassium: 4.1 mmol/L (ref 3.5–5.1)
Sodium: 142 mmol/L (ref 135–145)

## 2021-10-05 LAB — GLUCOSE, CAPILLARY
Glucose-Capillary: 175 mg/dL — ABNORMAL HIGH (ref 70–99)
Glucose-Capillary: 203 mg/dL — ABNORMAL HIGH (ref 70–99)
Glucose-Capillary: 179 mg/dL — ABNORMAL HIGH (ref 70–99)
Glucose-Capillary: 175 mg/dL — ABNORMAL HIGH (ref 70–99)
Glucose-Capillary: 182 mg/dL — ABNORMAL HIGH (ref 70–99)

## 2021-10-05 LAB — COOXEMETRY PANEL
Carboxyhemoglobin: 1.6 % — ABNORMAL HIGH (ref 0.5–1.5)
Methemoglobin: 0.6 % (ref 0.0–1.5)
O2 Saturation: 88.5 %
Total hemoglobin: 10.4 g/dL — ABNORMAL LOW (ref 12.0–16.0)

## 2021-10-05 LAB — VANCOMYCIN, RANDOM: Vancomycin Rm: 11

## 2021-10-05 MED ORDER — CHLORHEXIDINE GLUCONATE 0.12 % MT SOLN
OROMUCOSAL | Status: AC
  Administered 2021-10-05: 15 mL via OROMUCOSAL
  Filled 2021-10-05: qty 15

## 2021-10-05 MED ORDER — FENTANYL BOLUS VIA INFUSION
100.0000 ug | INTRAVENOUS | Status: DC | PRN
Start: 2021-10-05 — End: 2021-10-06
  Administered 2021-10-06: 100 ug via INTRAVENOUS
  Filled 2021-10-05: qty 100

## 2021-10-05 NOTE — Progress Notes (Signed)
NAME:  Tyler Wong, MRN:  [id number], DOB:  1966/03/12, LOS: 49 ADMISSION DATE:  10/17/2021, CONSULTATION DATE:  10/05/2021 REFERRING MD:  , CHIEF COMPLAINT:  SOB   BRIEF  55 year old male with history of Gastric cancer, HTN, DM type II, CAD, HFrEF (20%), Polysubstance abuse CKD stage 3b, arrived via EMS due to shortness of breath he was found to be hypothermic and hypoglycemic he was sitting in the rain in the cold for a few hour. On arrival: hypotensive with systolic of 46/27 with labs showing acute on chronic kidney disease with potassium of 5.9.  Patient was acidotic with pH of 7.27 and bicarb of 8.  Initial exam: agitated tachypneic he was on Levophed 40 mics and is being treated for hyperkalemia.  Patient was evaluated by nephrology and will continue medical management and possible need for CRRT if no improvement.  Patient was agitated very hard to get any history from.  Pertinent  Medical History  -Gastric cancer;  CR w/ Chemo. Lost to follow up  -Hypertension -Diabetes mellitus -CAD -Congestive heart failure ejection fraction 20% -Cocaine and polysubstance abuse -Diabetes mellitus -Cachexia lean body mass index -Polysubstance abuse with cocaine benzodiazepine and marijuana positivity multiple occasions  Significant Hospital Events: Including procedures, antibiotic start and stop dates in addition to other pertinent events   10/2: admit to inpatient.  Urine toxicology positive for cocaine 10/3: vanc and cefepime started. CVC, A line, and HD catheters placed. Start CRRT. Vasopressors x2 in addition to inotrope support  Goals of care discussed with family. Remains Full code per patient's wishes with family members PTA. 10/4: Continue CRRT, vasopressors x2 still high dose. Dobutamine at 7.5; lactic acid down to 1.9  10/5: wean off propofol. Continue CRRT. Co-ox dropping. Cvp and exam suggest euvolemic so changed volume goal to +50 cc an hour 10/6: TTE LVEF <20%, severely decreased  function with global hypokinesis and severe dilation.  Grade 2 diastolic dysfunction.  Moderate decreased RV function with normal size.  Moderately elevated PASP 52.5 mmHg.  Severely dilated left and right atria 10/7: CT head -no acute intracranial abnormality, chronic right cerebral and cerebellar infarct.  Minimally displaced fracture of the left zygomatic arch 10/7: Dobutamine decreased 7.5, CVVH was stopped 10/8: dobuta to 5, family meeting > no CPR, continue other care; unclear that he would want longterm care/LTAC, etc  Interim History / Subjective:   Family meeting 10/8 as documented Dobutamine decreased to 5 > Co. Oximetry 64% Norepinephrine to 4, dobutamine remains 5 Fentanyl 125, restraints placed 10/8 I/O+ 3.7 L total, urine output 1.7 L last 24 hours S Cr 1.20 > 1.33 off dialysis   Objective   Blood pressure 103/61, pulse 94, temperature 97.7 F (36.5 C), resp. rate (!) 21, height 5\' 11"  (1.803 m), weight 76.4 kg, SpO2 100 %. CVP:  [8 mmHg-20 mmHg] 10 mmHg  Vent Mode: PRVC FiO2 (%):  [30 %] 30 % Set Rate:  [20 bmp] 20 bmp Vt Set:  [450 mL] 450 mL PEEP:  [5 cmH20] 5 cmH20 Pressure Support:  [12 cmH20] 12 cmH20 Plateau Pressure:  [14 cmH20-16 cmH20] 16 cmH20   Intake/Output Summary (Last 24 hours) at 10/05/2021 0721 Last data filed at 10/05/2021 0350 Gross per 24 hour  Intake 2473.99 ml  Output 1925 ml  Net 548.99 ml   Filed Weights   10/02/21 0500 10/03/21 0414 10/04/21 0234  Weight: 74.3 kg 74.9 kg 76.4 kg    Examination: General Appearance: Thin ill-appearing man, ventilated HEENT: ET tube in  place, oropharynx otherwise clear, scleral icterus, pupils equal Neck: Mild JVD Lungs: Distant, clear, no crackles or wheezes Heart: Regular, no murmur Abdomen: Nondistended, positive bowel sounds Extremities: 2+ ankle edema, some duskiness/discoloration bilateral toes Skin: No rash, no wounds Neurologic: More awake.  Eyes spontaneously open, tracks, may intermittently  follow some commands, moves upper extremities    Resolved Hospital Problem list   Hyperglycemia Lactic acidosis (resolved 10/4) Life threatening hyperkalemia (resolved 10/4)  Assessment & Plan:  Cardiogenic shock (POA) w/ progressive Multiple Organ Failure.  Consider also possible septic shock given elevated procalcitonin but no clear source.  Has been treated empirically SVT versus VT.  Suspect due to inotropic/chronotropic effects of dobutamine -Plan to decrease dobutamine to 2.5, follicle oximetry -Continue to wean norepinephrine as able -Vasopressin off since 10/8 -Goal to keep I=O -Continue stress dose hydrocortisone until pressor needs resolved -Home Entresto, metolazone, BiDil, spironolactone are on hold -All culture data negative.  No clear evidence to support an upper extremity cellulitis.  Plan to discontinue meropenem (received 7 days) and vancomycin (received 2 days) on 10/9, follow clinically  Acute respiratory failure present on admission secondary to end organ hypoperfusion and profound metabolic acidosis - present on admit -Continue PRVC.  He is tolerating some PSV.  Need to be able to lighten his sedating medication, allow appropriate WUA to assess his potential for extubation -VAP prevention orders -Sedation as per PAD protocol, attempting to lighten 10/9 -Follow chest x-ray  Acute on chronic renal failure (CKD stage 3b)  - present on admit.  CVVHD held 10/7 -Following urine output, BMP, hemodynamics off CVVH.  Hopefully he will not require further HD -Can consider repeat Lasix depending on urine output, I/O, CVP, metabolic panel  Probable bowel ischemia/ischemic colitis in the setting of organ hypoperfusion and profound lactic acidosis, further supported by CT abdomen findings-->lactate has cleared.  Tolerating trickle tube feeding -Completed course empiric meropenem -Continue tube feeding  Acute metabolic encephalopathy in setting of acidosis, shock, also could  be element of toxic encephalopathy due to polysubstance abuse +/- wd.  Current contribution from his necessary sedating medications -CT head 10/7 reassuring -Propofol is off.  Plan to wean fentanyl as able, RASS goal 0 to -1 -Daily wake-up assessment  Coagulopathy/DIC due to critical illness, possible sepsis (unclear source) with associated thrombocytopenia, present on admission.  Consider medication effect.  HITT negative -ASA and anticoagulation are both on hold -No evidence DVT -Follow CBC.  Goal platelets > 10k as long as no evidence of active bleeding  Anemia or illness - onset of 09/29/21.  Without any evidence of active bleeding -Continue transfusion goal hemoglobin 7.0 unless active bleeding noted  DM type 2 w/ hyperglycemia v. Hypoglycemia - CBGs continue to fluctuate.  -Following CBG closely, anticipate some variability as tube feedings are uptitrated -Sign scale insulin as per protocol   Best Practice (right click and "Reselect all SmartList Selections" daily)   Diet/type: tubefeeds DVT prophylaxis: SCD GI prophylaxis: PPI Lines: Central line, Dialysis Catheter, and Arterial Line Foley:  Yes, and it is still needed Code Status:  full code Last date of multidisciplinary goals of care discussion 10/8   - [phone] [place]   Family meeting with sisters, multiple family members in the ICU 10/8.  Confirmed that patient would want continued aggressive care to try to get over acute events, ultimately be able to be home, live independently.  All understand that prognosis remains guarded.  Unclear that patient would want continue dependent care,  continued ICU care or LTAC care.  Would need to discuss further depending on overall prognosis.  I recommended that patient would not benefit from CPR if he were to have an acute arrest.  It was agreed to defer chest compressions under such circumstances, try cardioversion/defib and other ACLS maneuvers if felt they could  be beneficial. Limited Code orders placed on chart.    ATTESTATION & SIGNATURE   The patient Hassel Uphoff is critically ill with multiple organ systems failure and requires high complexity decision making for assessment and support, frequent evaluation and titration of therapies, application of advanced monitoring technologies and extensive interpretation of multiple databases.   Independent CC Time 33 minutes  Baltazar Apo, MD, PhD 10/05/2021, 7:21 AM [facility] [phone] or if no answer before 7:00PM call [phone] For any issues after 7:00PM please call [facility]  Lab 10/23/2021 1955 10/26/2021 2122 09/29/21 0120 09/29/21 1252 09/29/21 2033 09/30/21 1100 10/01/21 0801 10/02/21 1636 10/03/21 1204 10/04/21 0315 10/04/21 0432 10/04/21 1018  PHART  --    < > 7.218*  --  7.506* 7.438  --   --   --  7.329* 7.358  --   PCO2ART  --    < > 33.7  --  31.2* 34.9  --   --   --  50.8* 45.8  --   PO2ART  --    < > 190*  --  184* 180*  --   --   --  49.6* 98.8  --   HCO3  --    < > 13.5*  --  24.5 23.2  --   --   --  25.9 25.1  --   TCO2 13*  --   --   --   --   --   --   --   --   --   --   --   O2SAT  --    < > 99.0   < > 99.9 99.5   < > 77.5 58.2 78.2  77.5 97.4 64.2   < > = values in this interval not displayed.    CBC Recent Labs  Lab 10/03/21 0940 10/04/21 0200 10/05/21 0508  HGB 11.5* 11.2* 10.7*  HCT 34.4* 33.5* 31.6*  WBC 12.2* 11.4* 10.6*  PLT 16* 20* 35*    COAGULATION Recent Labs  Lab 10/25/2021 1923 09/30/21 1403  INR 3.3* 2.3*    CARDIAC  No results for input(s): TROPONINI in the last 168 hours. No results for input(s): PROBNP in the last 168 hours.   CHEMISTRY Recent Labs  Lab 10/01/21 0510 10/01/21 1600 10/01/21 1620 10/02/21 0524 10/02/21 1636 10/03/21 0550 10/03/21 1550 10/04/21 0200 10/05/21 0508  NA 135   < >  --  134* 135 134* 138 142  137 142  K 4.9   < >  --   4.6 4.5 4.3 4.3 4.3  4.2 4.1  CL 104   < >  --  103 103 105 106 109  104 110  CO2 19*   < >  --  26 26 26 26 27  26 25   GLUCOSE 201*   < >  --  205* 211* 221* 214* 205*  200* 201*  BUN 40*   < >  --  40* 39* 37* 37* 47*  47* 69*  CREATININE 1.36*   < >  --  1.04 0.99 0.94 1.02 1.20  1.12 1.33*  CALCIUM 8.3*   < >  --  7.7* 7.6* 7.7* 7.5* 8.1*  7.8* 7.9*  MG 2.5*  --  2.7* 2.6*  --  2.5*  --  2.4  --   PHOS 4.6  4.4   < > 3.3 2.9 2.7 2.9 2.8 3.8  3.6 4.2   < > = values in this interval not displayed.   Estimated Creatinine Clearance: 66.8 mL/min (A) (by C-G formula based on SCr of 1.33 mg/dL (H)).   LIVER Recent Labs  Lab 10/21/2021 1853 10/16/2021 1923 09/29/21 1549 09/29/21 2032 09/30/21 0542 09/30/21 1403 09/30/21 1706 10/02/21 1636 10/03/21 0550 10/03/21 1550 10/04/21 0200 10/05/21 0508  AST 116*  --   --  128*  --   --   --   --   --   --   --   --   ALT 80*  --   --  64*  --   --   --   --   --   --   --   --   ALKPHOS 161*  --   --  120  --   --   --   --   --   --   --   --   BILITOT 7.0*  --   --  5.1*  --   --   --   --   --   --   --   --   PROT 7.4  --   --  5.7*  --   --   --   --   --   --   --   --   ALBUMIN 3.3*  --    < > 2.4*   < >  --    < > 2.1* 2.1* 2.1* 2.2* 2.2*  INR  --  3.3*  --   --   --  2.3*  --   --   --   --   --   --    < > = values in this interval not displayed.     INFECTIOUS Recent Labs  Lab 09/29/21 1252 09/30/21 0542 09/30/21 0907 10/01/21 0510 10/01/21 1700 10/02/21 0524 10/04/21 0200  LATICACIDVEN  --   --    < > 3.0* 1.9 1.4 1.2  PROCALCITON 23.28 11.71  --  6.18  --   --   --    < > = values in this interval not displayed.     ENDOCRINE CBG (last 3)  Recent Labs    10/04/21 1937 10/04/21 2345 10/05/21 0331  GLUCAP 166* 161* 175*

## 2021-10-05 NOTE — Progress Notes (Signed)
[facility] Progress Note  Subjective: pt seen in ICU, UOP remains very good off of CRRT, 1.7 L yesterday and 1300 so far today.   Vitals:   10/05/21 0700 10/05/21 0743 10/05/21 0800 10/05/21 0900  BP: 97/68 100/60 97/71 101/71  Pulse: 92 98 93 (!) 102  Resp: (!) 21 (!) 22 (!) 21 19  Temp: 98.1 F (36.7 C)  98.4 F (36.9 C) 98.6 F (37 C)  TempSrc:      SpO2: 100% 100% 100% 100%  Weight:      Height:        Exam: on vent ,sedated  no jvd  throat ett in place  Chest cta bilat and lat  Cor reg no RG  Abd soft ntnd no ascites   Ext diffuse mild-mod pitting edema of UE's and LE's   Neuro on vent and sedated, not following commands    R fem HD cath and a-line in place     UA 10/02 - mod Hb, prot 100, 6-10 rbcs, 0-5 wbcs   Renal US 10/03 - 10 cm kidneys bilat w/o hydro, normal echotexture  CXR : IMPRESSION: 1. Interval placement of right subclavian central venous catheter with tip in the projection of the SVC. No pneumothorax. 2. Decreased aeration to the retrocardiac left lung base   Assessment/ Plan: AKI on CKD stage 3-  b/l creatinine is 1.42- 1.92 from July 2022, egfr 41-58 ml/min. AKI presumably due to cardiogenic shock/ cardiorenal syndrome.  UA unremarkable. [place] w/o signs of obstruction.  LVEF around 20%, multiple previous admissions for low cardiac output heart failure. On levo/ vaso gtt's and IV dobutamine due to shock. Started CRRT on 10/3. CRRT dc'd 10/7 and since dc of CRRT his BP's have improved, UOP has greatly improved and pressors/ inotropes are almost off. Creat 1.1 yest and 1.3 today, appears to be stabilizing. Cont to hold CRRT, recheck labs in am.   A/C combined s/d CHF- w/ diffuse edema of the extremities, but no pulm edema per last CXR 10/7.  Cardiogenic shock - resolving off of dialysis Substance abuse CAD sp STEMI Tobacco use COPD     Rob Chidi Shirer 10/05/2021, 10:25 AM   Recent Labs  Lab 10/04/21 0200 10/05/21 0508  K 4.3  4.2 4.1   BUN 47*  47* 69*  CREATININE 1.20  1.12 1.33*  CALCIUM 8.1*  7.8* 7.9*  PHOS 3.8  3.6 4.2  HGB 11.2* 10.7*    Inpatient medications:  calcium gluconate  1 g Intravenous Once   chlorhexidine gluconate (MEDLINE KIT)  15 mL Mouth Rinse BID   Chlorhexidine Gluconate Cloth  6 each Topical Daily   dextrose  50 mL Intravenous Once   docusate  100 mg Per Tube BID   feeding supplement (PROSource TF)  90 mL Per Tube QID   hydrocortisone sod succinate (SOLU-CORTEF) inj  100 mg Intravenous Q12H   insulin aspart  0-6 Units Subcutaneous Q4H   mouth rinse  15 mL Mouth Rinse 10 times per day   pantoprazole (PROTONIX) IV  40 mg Intravenous Q24H   polyethylene glycol  17 g Per Tube Daily   sodium chloride flush  10-40 mL Intracatheter Q12H    sodium chloride Stopped (10/01/21 2021)   sodium chloride 20 mL/hr at 10/05/21 0800   DOBUTamine 2.5 mcg/kg/min (10/05/21 0906)   feeding supplement (VITAL 1.5 CAL) 50 mL/hr at 10/04/21 1802   fentaNYL infusion INTRAVENOUS 125 mcg/hr (10/05/21 0800)   norepinephrine (LEVOPHED) Adult infusion 4 mcg/min (  10/05/21 0800)   fentaNYL (SUBLIMAZE) injection, sodium chloride flush

## 2021-10-06 ENCOUNTER — Inpatient Hospital Stay (HOSPITAL_COMMUNITY): Payer: Medicaid Other

## 2021-10-06 DIAGNOSIS — E43 Unspecified severe protein-calorie malnutrition: Secondary | ICD-10-CM | POA: Diagnosis not present

## 2021-10-06 DIAGNOSIS — N179 Acute kidney failure, unspecified: Secondary | ICD-10-CM | POA: Diagnosis not present

## 2021-10-06 DIAGNOSIS — J810 Acute pulmonary edema: Secondary | ICD-10-CM | POA: Diagnosis not present

## 2021-10-06 DIAGNOSIS — R570 Cardiogenic shock: Secondary | ICD-10-CM | POA: Diagnosis not present

## 2021-10-06 LAB — RENAL FUNCTION PANEL
Albumin: 2.1 g/dL — ABNORMAL LOW (ref 3.5–5.0)
Anion gap: 4 — ABNORMAL LOW (ref 5–15)
BUN: 76 mg/dL — ABNORMAL HIGH (ref 6–20)
CO2: 27 mmol/L (ref 22–32)
Calcium: 8.4 mg/dL — ABNORMAL LOW (ref 8.9–10.3)
Chloride: 119 mmol/L — ABNORMAL HIGH (ref 98–111)
Creatinine, Ser: 1.25 mg/dL — ABNORMAL HIGH (ref 0.61–1.24)
GFR, Estimated: >60 mL/min (ref >60)
Glucose, Bld: 219 mg/dL — ABNORMAL HIGH (ref 70–99)
Phosphorus: 3.9 mg/dL (ref 2.5–4.6)
Potassium: 4.5 mmol/L (ref 3.5–5.1)
Sodium: 150 mmol/L — ABNORMAL HIGH (ref 135–145)

## 2021-10-06 LAB — GLUCOSE, CAPILLARY
Glucose-Capillary: 158 mg/dL — ABNORMAL HIGH (ref 70–99)
Glucose-Capillary: 158 mg/dL — ABNORMAL HIGH (ref 70–99)
Glucose-Capillary: 180 mg/dL — ABNORMAL HIGH (ref 70–99)
Glucose-Capillary: 186 mg/dL — ABNORMAL HIGH (ref 70–99)
Glucose-Capillary: 194 mg/dL — ABNORMAL HIGH (ref 70–99)
Glucose-Capillary: 23 mg/dL — CL (ref 70–99)
Glucose-Capillary: 78 mg/dL (ref 70–99)
Glucose-Capillary: 95 mg/dL (ref 70–99)

## 2021-10-06 LAB — CBC
HCT: 31 % — ABNORMAL LOW (ref 39.0–52.0)
Hemoglobin: 10.1 g/dL — ABNORMAL LOW (ref 13.0–17.0)
MCH: 29.9 pg (ref 26.0–34.0)
MCHC: 32.6 g/dL (ref 30.0–36.0)
MCV: 91.7 fL (ref 80.0–100.0)
Platelets: 40 10*3/uL — ABNORMAL LOW (ref 150–400)
RBC: 3.38 MIL/uL — ABNORMAL LOW (ref 4.22–5.81)
RDW: 22.7 % — ABNORMAL HIGH (ref 11.5–15.5)
WBC: 8.7 10*3/uL (ref 4.0–10.5)
nRBC: 0 % (ref 0.0–0.2)

## 2021-10-06 LAB — COOXEMETRY PANEL
Carboxyhemoglobin: 1.5 % (ref 0.5–1.5)
Methemoglobin: 0.6 % (ref 0.0–1.5)
O2 Saturation: 84.1 %
Total hemoglobin: 9.9 g/dL — ABNORMAL LOW (ref 12.0–16.0)

## 2021-10-06 MED ORDER — PANTOPRAZOLE 2 MG/ML SUSPENSION
40.0000 mg | Freq: Every day | ORAL | Status: DC
  Administered 2021-10-07 – 2021-10-10 (×4): 40 mg
  Filled 2021-10-06 (×4): qty 20

## 2021-10-06 MED ORDER — HYDROCORTISONE SOD SUC (PF) 100 MG IJ SOLR
50.0000 mg | Freq: Two times a day (BID) | INTRAMUSCULAR | Status: AC
  Administered 2021-10-06 – 2021-10-07 (×3): 50 mg via INTRAVENOUS
  Filled 2021-10-06 (×3): qty 2

## 2021-10-06 MED ORDER — FENTANYL BOLUS VIA INFUSION
50.0000 ug | INTRAVENOUS | Status: DC | PRN
  Administered 2021-10-06 (×2): 100 ug via INTRAVENOUS
  Administered 2021-10-07 (×2): 50 ug via INTRAVENOUS
  Administered 2021-10-09: 75 ug via INTRAVENOUS
  Administered 2021-10-09: 100 ug via INTRAVENOUS
  Administered 2021-10-09: 75 ug via INTRAVENOUS
  Administered 2021-10-09 – 2021-10-10 (×2): 100 ug via INTRAVENOUS
  Filled 2021-10-06: qty 100

## 2021-10-06 MED ORDER — FREE WATER
200.0000 mL | Status: DC
  Administered 2021-10-06 – 2021-10-07 (×8): 200 mL

## 2021-10-06 NOTE — Progress Notes (Signed)
[facility] Progress Note  Subjective: pt seen in ICU, uop ~1.5L, cr 1.3. Dobutamine currently off  Vitals:   10/06/21 1200 10/06/21 1230 10/06/21 1300 10/06/21 1330  BP: 92/73  (!) 89/61   Pulse: (!) 120 (!) 112 (!) 114 (!) 117  Resp: _0 Temp: 100 F (37.8 C)     TempSrc:      SpO2: 100% 100% 100% 100%  Weight:      Height:        Exam: Gen: sedated, ill appearing CV: rrr Resp: intubated, b/l chest rise, cta bl Abd: soft Ext: diffuse edema of all ext Neuro: sedated     UA 10/02 - mod Hb, prot 100, 6-10 rbcs, 0-5 wbcs   Renal US 10/03 - 10 cm kidneys bilat w/o hydro, normal echotexture  CXR : IMPRESSION: 1. Interval placement of right subclavian central venous catheter with tip in the projection of the SVC. No pneumothorax. 2. Decreased aeration to the retrocardiac left lung base     Recent Labs  Lab 10/05/21 0508 10/06/21 0402  K 4.1 4.5  BUN 69* 76*  CREATININE 1.33* 1.25*  CALCIUM 7.9* 8.4*  PHOS 4.2 3.9  HGB 10.7* 10.1*   Inpatient medications:  chlorhexidine gluconate (MEDLINE KIT)  15 mL Mouth Rinse BID   Chlorhexidine Gluconate Cloth  6 each Topical Daily   docusate  100 mg Per Tube BID   feeding supplement (PROSource TF)  90 mL Per Tube QID   free water  200 mL Per Tube Q4H   hydrocortisone sod succinate (SOLU-CORTEF) inj  50 mg Intravenous Q12H   insulin aspart  0-6 Units Subcutaneous Q4H   mouth rinse  15 mL Mouth Rinse 10 times per day   [START ON 10/07/2021] pantoprazole sodium  40 mg Per Tube Daily   polyethylene glycol  17 g Per Tube Daily   sodium chloride flush  10-40 mL Intracatheter Q12H    sodium chloride Stopped (10/01/21 2021)   sodium chloride 10 mL/hr at 10/06/21 1311   feeding supplement (VITAL 1.5 CAL) 1,000 mL (10/06/21 0826)   fentaNYL infusion INTRAVENOUS 200 mcg/hr (10/06/21 1312)   norepinephrine (LEVOPHED) Adult infusion Stopped (10/05/21 1518)   fentaNYL, sodium chloride flush    Assessment/  Plan: AKI on CKD stage 3-  b/l creatinine is 1.42- 1.92 from July 2022, egfr 41-58 ml/min. AKI presumably due to cardiogenic shock/ cardiorenal syndrome.  UA unremarkable. [place] w/o signs of obstruction.  LVEF around 20%, multiple previous admissions for low cardiac output heart failure. On levo/ vaso gtt's and IV dobutamine due to shock. Started CRRT on 10/3. CRRT dc'd 10/7 and since dc of CRRT his BP's have improved, UOP has greatly improved and pressors/ inotropes are off. Cr plateaued, 1.3 today with adequate urine output. Can d/c fem HD line if still present Hypernatremia: free water deficit around 3.3L, agree with increasing FWF to 200cc q4h A/C combined s/d CHF- w/ diffuse edema of the extremities, but no pulm edema per last CXR 10/7. Can diurese PRN Cardiogenic shock - resolving off of dialysis, dobutamine currently off AHRF: vent support per PCCM Anemia: transfused prn Substance abuse CAD sp STEMI Tobacco use COPD  Cr continues to be stable and he is non oliguric. Follow Na and adjust FWF accordingly. Can remove HD catheter. Will sign off from a nephrology perspective. Please call with any questions/concerns.  Gean Quint, MD [facility]

## 2021-10-06 NOTE — Progress Notes (Signed)
NAME:  Tyler Wong, MRN:  [id number], DOB:  07-Mar-1966, LOS: 70 ADMISSION DATE:  10/01/2021, CONSULTATION DATE:  10/26/2021 REFERRING MD:  , CHIEF COMPLAINT:  SOB   BRIEF  55 year old male with history of Gastric cancer, HTN, DM type II, CAD, HFrEF (20%), Polysubstance abuse CKD stage 3b, arrived via EMS due to shortness of breath he was found to be hypothermic and hypoglycemic he was sitting in the rain in the cold for a few hour. On arrival: hypotensive with systolic of 33/29 with labs showing acute on chronic kidney disease with potassium of 5.9.  Patient was acidotic with pH of 7.27 and bicarb of 8.  Initial exam: agitated tachypneic he was on Levophed 40 mics and is being treated for hyperkalemia.  Patient was evaluated by nephrology and will continue medical management and possible need for CRRT if no improvement.  Patient was agitated very hard to get any history from.  Pertinent  Medical History  -Gastric cancer;  CR w/ Chemo. Lost to follow up  -Hypertension -Diabetes mellitus -CAD -Congestive heart failure ejection fraction 20% -Cocaine and polysubstance abuse -Diabetes mellitus -Cachexia lean body mass index -Polysubstance abuse with cocaine benzodiazepine and marijuana positivity multiple occasions  Significant Hospital Events: Including procedures, antibiotic start and stop dates in addition to other pertinent events   10/2: admit to inpatient.  Urine toxicology positive for cocaine 10/3: vanc and cefepime started. CVC, A line, and HD catheters placed. Start CRRT. Vasopressors x2 in addition to inotrope support  Goals of care discussed with family. Remains Full code per patient's wishes with family members PTA. 10/4: Continue CRRT, vasopressors x2 still high dose. Dobutamine at 7.5; lactic acid down to 1.9  10/5: wean off propofol. Continue CRRT. Co-ox dropping. Cvp and exam suggest euvolemic so changed volume goal to +50 cc an hour 10/6: TTE LVEF <20%, severely decreased  function with global hypokinesis and severe dilation.  Grade 2 diastolic dysfunction.  Moderate decreased RV function with normal size.  Moderately elevated PASP 52.5 mmHg.  Severely dilated left and right atria 10/7: CT head -no acute intracranial abnormality, chronic right cerebral and cerebellar infarct.  Minimally displaced fracture of the left zygomatic arch 10/7: Dobutamine decreased 7.5, CVVH was stopped 10/8: dobuta to 5, family meeting > no CPR, continue other care; unclear that he would want longterm care/LTAC, etc 10/10: Decreased dose of dobutamine 2.5  Interim History / Subjective:   No overnight events Dobutamine at 2.5 Co-ox 84, off norepinephrine Family meeting 10/8 as documented  Remains off CRRT  Objective   Blood pressure 103/63, pulse (!) 105, temperature (!) 100.6 F (38.1 C), resp. rate 18, height 5\' 11"  (1.803 m), weight 76.7 kg, SpO2 100 %. CVP:  [6 mmHg-21 mmHg] 7 mmHg  Vent Mode: CPAP;PSV FiO2 (%):  [30 %] 30 % Set Rate:  [20 bmp] 20 bmp Vt Set:  [450 mL] 450 mL PEEP:  [5 cmH20] 5 cmH20 Pressure Support:  [8 cmH20] 8 cmH20 Plateau Pressure:  [13 cmH20-17 cmH20] 13 cmH20   Intake/Output Summary (Last 24 hours) at 10/06/2021 0830 Last data filed at 10/06/2021 0534 Gross per 24 hour  Intake 2304.73 ml  Output 2505 ml  Net -200.27 ml   Filed Weights   10/03/21 0414 10/04/21 0234 10/06/21 0400  Weight: 74.9 kg 76.4 kg 76.7 kg    Examination: General Appearance: Thin, chronically ill-appearing, responsive HEENT: Endotracheal tube in place  neck: Mild JVD Lungs: Rhonchi Heart: Systolic murmur at apex Abdomen: Bowel sounds appreciated Extremities:  2+ edema, dusky toes Skin: No rash, no wounds Neurologic: Arousable, interactive, follows one-step commands to blink   Resolved Hospital Problem list   Hyperglycemia Lactic acidosis (resolved 10/4) Life threatening hyperkalemia (resolved 10/4)  Assessment & Plan:  Cardiogenic shock-present on  admission Multiple organ failure Septic shock Supraventricular tachycardia-suspected related to inotropic/chronotropic effects -On dobutamine 2.5 -On stress dose steroids -Home Entresto, metolazone, spironolactone, BiDil on hold -Off antibiotics-did receive 7 days of meropenem, vancomycin-antibiotics discontinued 10/9  Acute respiratory failure-present on admission -Continue to wean as tolerated -VAP prevention -Lighten sedation as able to wean assessment  Acute on chronic renal failure chronic kidney disease stage IIIb, present on admission Was on CVVHD-held 10/7 -Renal service continues to follow  Probable bowel ischemia in the setting of organ hypoperfusion -Lactate has cleared -Continue tube feeding -Completed empiric meropenem  Acute metabolic encephalopathy -Maintain RASS goal of 0 to -1 -Wean off sedation as tolerated -Daily work-up assessment  Coagulopathy/DIC secondary to critical illness -Anticoagulation, ASA on hold -Follow CBC, trend platelets-counts improving  Anemia of critical illness -We will transfuse per protocol  Type 2 diabetes -Continue insulin per protocol  Prognosis remains guarded  Best Practice (right click and "Reselect all SmartList Selections" daily)   Diet/type: tubefeeds DVT prophylaxis: SCD GI prophylaxis: PPI Lines: Central line, Dialysis Catheter, and Arterial Line Foley:  Yes, and it is still needed Code Status:  full code Last date of multidisciplinary goals of care discussion 10/8   - [phone] [place]   Family meeting with sisters, multiple family members in the ICU 10/8.  Confirmed that patient would want continued aggressive care to try to get over acute events, ultimately be able to be home, live independently.  All understand that prognosis remains guarded.  Unclear that patient would want continue dependent care, continued ICU care or LTAC care.  Would need to discuss further depending on overall  prognosis.  I recommended that patient would not benefit from CPR if he were to have an acute arrest.  It was agreed to defer chest compressions under such circumstances, try cardioversion/defib and other ACLS maneuvers if felt they could be beneficial. Limited Code orders placed on chart.    ATTESTATION & SIGNATURE   The patient is critically ill with multiple organ systems failure and requires high complexity decision making for assessment and support, frequent evaluation and titration of therapies, application of advanced monitoring technologies and extensive interpretation of multiple databases. Critical Care Time devoted to patient care services described in this note independent of APP/resident time (if applicable)  is 40 minutes.   Sherrilyn Rist MD [facility] Personal pager: See Amion If unanswered, please page CCM On-call: [phone]

## 2021-10-07 DIAGNOSIS — E43 Unspecified severe protein-calorie malnutrition: Secondary | ICD-10-CM | POA: Diagnosis not present

## 2021-10-07 DIAGNOSIS — J810 Acute pulmonary edema: Secondary | ICD-10-CM | POA: Diagnosis not present

## 2021-10-07 DIAGNOSIS — N179 Acute kidney failure, unspecified: Secondary | ICD-10-CM | POA: Diagnosis not present

## 2021-10-07 DIAGNOSIS — R570 Cardiogenic shock: Secondary | ICD-10-CM | POA: Diagnosis not present

## 2021-10-07 LAB — GLUCOSE, CAPILLARY
Glucose-Capillary: 210 mg/dL — ABNORMAL HIGH (ref 70–99)
Glucose-Capillary: 260 mg/dL — ABNORMAL HIGH (ref 70–99)
Glucose-Capillary: 203 mg/dL — ABNORMAL HIGH (ref 70–99)
Glucose-Capillary: 181 mg/dL — ABNORMAL HIGH (ref 70–99)
Glucose-Capillary: 172 mg/dL — ABNORMAL HIGH (ref 70–99)

## 2021-10-07 LAB — RENAL FUNCTION PANEL
Albumin: 2.5 g/dL — ABNORMAL LOW (ref 3.5–5.0)
Anion gap: 3 — ABNORMAL LOW (ref 5–15)
BUN: 93 mg/dL — ABNORMAL HIGH (ref 6–20)
CO2: 28 mmol/L (ref 22–32)
Calcium: 8.3 mg/dL — ABNORMAL LOW (ref 8.9–10.3)
Chloride: 116 mmol/L — ABNORMAL HIGH (ref 98–111)
Creatinine, Ser: 1.2 mg/dL (ref 0.61–1.24)
GFR, Estimated: >60 mL/min (ref >60)
Glucose, Bld: 247 mg/dL — ABNORMAL HIGH (ref 70–99)
Phosphorus: 4.7 mg/dL — ABNORMAL HIGH (ref 2.5–4.6)
Potassium: 5.6 mmol/L — ABNORMAL HIGH (ref 3.5–5.1)
Sodium: 147 mmol/L — ABNORMAL HIGH (ref 135–145)

## 2021-10-07 MED ORDER — ACETAMINOPHEN 325 MG PO TABS
650.0000 mg | ORAL_TABLET | Freq: Four times a day (QID) | ORAL | Status: DC | PRN
  Administered 2021-10-07: 650 mg
  Filled 2021-10-07: qty 2

## 2021-10-07 MED ORDER — FREE WATER
100.0000 mL | Freq: Three times a day (TID) | Status: DC
  Administered 2021-10-07 – 2021-10-09 (×5): 100 mL

## 2021-10-07 MED ORDER — PROSOURCE TF PO LIQD
90.0000 mL | Freq: Two times a day (BID) | ORAL | Status: DC
  Administered 2021-10-07 – 2021-10-10 (×6): 90 mL
  Filled 2021-10-07 (×6): qty 90

## 2021-10-07 MED ORDER — SODIUM ZIRCONIUM CYCLOSILICATE 5 G PO PACK
5.0000 g | PACK | Freq: Once | ORAL | Status: AC
  Administered 2021-10-07: 5 g
  Filled 2021-10-07: qty 1

## 2021-10-07 MED ORDER — VITAL 1.5 CAL PO LIQD
1000.0000 mL | ORAL | Status: DC
  Administered 2021-10-08 – 2021-10-10 (×3): 1000 mL
  Filled 2021-10-07 (×5): qty 1000

## 2021-10-07 NOTE — Plan of Care (Signed)
  Problem: Education: Goal: Knowledge of General Education information will improve Description: Including pain rating scale, medication(s)/side effects and non-pharmacologic comfort measures Outcome: Progressing   Problem: Activity: Goal: Risk for activity intolerance will decrease Outcome: Progressing   Problem: Pain Managment: Goal: General experience of comfort will improve Outcome: Progressing   Problem: Safety: Goal: Ability to remain free from injury will improve Outcome: Progressing   Problem: Safety: Goal: Non-violent Restraint(s) Outcome: Progressing

## 2021-10-07 NOTE — TOC Progression Note (Signed)
Transition of Care [facility]) - Progression Note    Patient Details  Name: Tyler Wong MRN: [id number] Date of Birth: 19-Jun-1966  Transition of Care [facility]) CM/SW Contact  Leeroy Cha, RN Phone Number: 10/07/2021, 7:35 AM  Clinical Narrative:    55 year old male with history of Gastric cancer, HTN, DM type II, CAD, HFrEF (20%), Polysubstance abuse CKD stage 3b, arrived via EMS due to shortness of breath he was found to be hypothermic and hypoglycemic he was sitting in the rain in the cold for a few hour. On arrival: hypotensive with systolic of 50/53 with labs showing acute on chronic kidney disease with potassium of 5.9.  Patient was acidotic with pH of 7.27 and bicarb of 8.  Initial exam: agitated tachypneic he was on Levophed 40 mics and is being treated for hyperkalemia.  Patient was evaluated by nephrology and will continue medical management and possible need for CRRT if no improvement.  Patient was agitated very hard to get any history from.   Pertinent  Medical History  -Gastric cancer;  CR w/ Chemo. Lost to follow up  -Hypertension -Diabetes mellitus -CAD -Congestive heart failure ejection fraction 20% -Cocaine and polysubstance abuse -Diabetes mellitus -Cachexia lean body mass index -Polysubstance abuse with cocaine benzodiazepine and marijuana positivity multiple occasions   Significant Hospital Events: Including procedures, antibiotic start and stop dates in addition to other pertinent events   10/2: admit to inpatient.  Urine toxicology positive for cocaine 10/3: vanc and cefepime started. CVC, A line, and HD catheters placed. Start CRRT. Vasopressors x2 in addition to inotrope support  Goals of care discussed with family. Remains Full code per patient's wishes with family members PTA. 10/4: Continue CRRT, vasopressors x2 still high dose. Dobutamine at 7.5; lactic acid down to 1.9  10/5: wean off propofol. Continue CRRT. Co-ox dropping. Cvp and exam suggest euvolemic so  changed volume goal to +50 cc an hour 10/6: TTE LVEF <20%, severely decreased function with global hypokinesis and severe dilation.  Grade 2 diastolic dysfunction.  Moderate decreased RV function with normal size.  Moderately elevated PASP 52.5 mmHg.  Severely dilated left and right atria 10/7: CT head -no acute intracranial abnormality, chronic right cerebral and cerebellar infarct.  Minimally displaced fracture of the left zygomatic arch 10/7: Dobutamine decreased 7.5, CVVH was stopped 10/8: dobuta to 5, family meeting > no CPR, continue other care; unclear that he would want longterm care/LTAC, etc 10/10: Decreased dose of dobutamine 2.5 TOC PLAN OF CARE: NO LTAC BENEFITS, SNF PLACEMENT WHEN STABLE. LIVES ALONE.  FOLLOWING FOR PROGRESSION.  PARTIAL CODE.  Expected Discharge Plan: Skilled Nursing Facility Barriers to Discharge: Continued Medical Work up  Expected Discharge Plan and Services Expected Discharge Plan: [place]   Discharge Planning Services: CM Consult   Living arrangements for the past 2 months: Group Home                                       Social Determinants of Health (SDOH) Interventions    Readmission Risk Interventions Readmission Risk Prevention Plan 10/02/2021 09/30/2021 06/20/2021  Transportation Screening Complete Complete Complete  Medication Review Press photographer) Complete Complete Complete  PCP or Specialist appointment within 3-5 days of discharge - Complete Complete  HRI or Home Care Consult Complete Complete Complete  SW Recovery Care/Counseling Consult Complete Complete Complete  Palliative Care Screening Not Applicable Not Applicable Not Applicable  Skilled  Nursing Facility Complete Not Applicable Not Applicable  Some recent data might be hidden

## 2021-10-07 NOTE — Progress Notes (Addendum)
NAME:  Tyler Wong, MRN:  [id number], DOB:  [birth year], LOS: 16 ADMISSION DATE:  10/01/2021, CONSULTATION DATE:  10/26/2021 REFERRING MD:  , CHIEF COMPLAINT:  SOB   BRIEF  55 year old male with history of Gastric cancer, HTN, DM type II, CAD, HFrEF (20%), Polysubstance abuse CKD stage 3b, arrived via EMS due to shortness of breath he was found to be hypothermic and hypoglycemic he was sitting in the rain in the cold for a few hour. On arrival: hypotensive with systolic of 01/09 with labs showing acute on chronic kidney disease with potassium of 5.9.  Patient was acidotic with pH of 7.27 and bicarb of 8.  Initial exam: agitated tachypneic he was on Levophed 40 mics and is being treated for hyperkalemia.  Patient was evaluated by nephrology and will continue medical management and possible need for CRRT if no improvement.  Patient was agitated very hard to get any history from.  Pertinent  Medical History  -Gastric cancer;  CR w/ Chemo. Lost to follow up  -Hypertension -Diabetes mellitus -CAD -Congestive heart failure ejection fraction 20% -Cocaine and polysubstance abuse -Diabetes mellitus -Cachexia lean body mass index -Polysubstance abuse with cocaine benzodiazepine and marijuana positivity multiple occasions  Significant Hospital Events: Including procedures, antibiotic start and stop dates in addition to other pertinent events   10/2: admit to inpatient.  Urine toxicology positive for cocaine 10/3: vanc and cefepime started. CVC, A line, and HD catheters placed. Start CRRT. Vasopressors x2 in addition to inotrope support  Goals of care discussed with family. Remains Full code per patient's wishes with family members PTA. 10/4: Continue CRRT, vasopressors x2 still high dose. Dobutamine at 7.5; lactic acid down to 1.9  10/5: wean off propofol. Continue CRRT. Co-ox dropping. Cvp and exam suggest euvolemic so changed volume goal to +50 cc an hour 10/6: TTE LVEF <20%, severely decreased  function with global hypokinesis and severe dilation.  Grade 2 diastolic dysfunction.  Moderate decreased RV function with normal size.  Moderately elevated PASP 52.5 mmHg.  Severely dilated left and right atria 10/7: CT head -no acute intracranial abnormality, chronic right cerebral and cerebellar infarct.  Minimally displaced fracture of the left zygomatic arch 10/7: Dobutamine decreased 7.5, CVVH was stopped 10/8: dobuta to 5, family meeting > no CPR, continue other care; unclear that he would want longterm care/LTAC, etc 10/10: Decreased dose of dobutamine 2.5 10/11: off dobutamine  Interim History / Subjective:   No overnight events Co-ox 84, off norepinephrine Family meeting 10/8 as documented  Remains off CRRT  Objective   Blood pressure 92/68, pulse (!) 109, temperature 98.1 F (36.7 C), temperature source Oral, resp. rate 20, height 5\' 11"  (1.803 m), weight 76.7 kg, SpO2 100 %. CVP:  [15 mmHg-36 mmHg] 26 mmHg  Vent Mode: PRVC FiO2 (%):  [30 %] 30 % Set Rate:  [20 bmp] 20 bmp Vt Set:  [450 mL] 450 mL PEEP:  [5 cmH20] 5 cmH20 Pressure Support:  [8 cmH20] 8 cmH20 Plateau Pressure:  [14 cmH20-16 cmH20] 14 cmH20   Intake/Output Summary (Last 24 hours) at 10/07/2021 0901 Last data filed at 10/07/2021 0646 Gross per 24 hour  Intake 2483.28 ml  Output 925 ml  Net 1558.28 ml   Filed Weights   10/04/21 0234 10/06/21 0400 10/07/21 0500  Weight: 76.4 kg 76.7 kg 76.7 kg    Examination: Middle-aged, frail General Appearance: Thin, chronically ill-appearing, responsive HEENT: Endotracheal tube in place neck: Mild JVD, no adenopathy Lungs: Mild rhonchi Heart: Systolic murmur at  cardiac apex Abdomen: Bowel sounds appreciated Extremities: 2+ edema, dusky toes Skin: No rash, no wounds Neurologic: Arousable, interactive, follows one-step commands to blink   Resolved Hospital Problem list   Hyperglycemia Lactic acidosis (resolved 10/4) Life threatening hyperkalemia (resolved  10/4)  Assessment & Plan:  Cardiogenic shock present on admission Multiple organ failure Septic shock Supraventricular tachycardia-suspected related to inotropic/chronotropic effects -Weaning stress dose steroids -Off pressors -Home antihypertensives including Entresto, metolazone, spironolactone, BiDil on hold -Completed antibiotics, received 7 days of meropenem, 2 days vancomycin.  Antibiotics discontinued 10/9  Acute respiratory failure-present on admission -Lighten sedation as tolerated -VAP prevention protocol -Continue to wean as tolerated  Acute on chronic renal failure Chronic kidney disease stage IIIb-present on admission -CVVHD was held 10/7 -Renal service signed off 10/10 -Urine output 1.5 L  Hyperkalemia -Give 1 dose of Lokelma -Continue to monitor closely  Probable bowel ischemia in the setting of organ hypoperfusion -Lactulose cleared -Continue tube feeding  Acute metabolic encephalopathy -Maintain RASS goal of 0 to -1 -Wean sedation as tolerated per protocol -Daily wake-up assessment  Coagulopathy/DIC secondary to critical illness -Anticoagulation on hold, aspirin on hold -Platelet counts continue to improve  Anemia critical illness -Transfuse per protocol  Type 2 diabetes -Continue insulin  Prognosis remains guarded  Best Practice (right click and "Reselect all SmartList Selections" daily)   Diet/type: tubefeeds DVT prophylaxis: SCD GI prophylaxis: PPI Lines: Dialysis Catheter and No longer needed.  Order written to d/c  Foley:  Yes, and it is still needed Code Status:  full code Last date of multidisciplinary goals of care discussion 10/8   - [phone] [place]   Family meeting with sisters, multiple family members in the ICU 10/8.  Confirmed that patient would want continued aggressive care to try to get over acute events, ultimately be able to be home, live independently.  All understand that prognosis remains  guarded.  Unclear that patient would want continue dependent care, continued ICU care or LTAC care.  Would need to discuss further depending on overall prognosis.  I recommended that patient would not benefit from CPR if he were to have an acute arrest.  It was agreed to defer chest compressions under such circumstances, try cardioversion/defib and other ACLS maneuvers if felt they could be beneficial. Limited Code orders placed on chart.   Sister updated at bedside 10/07/2021  ATTESTATION & SIGNATURE   The patient is critically ill with multiple organ systems failure and requires high complexity decision making for assessment and support, frequent evaluation and titration of therapies, application of advanced monitoring technologies and extensive interpretation of multiple databases. Critical Care Time devoted to patient care services described in this note independent of APP/resident time (if applicable)  is 40 minutes.   Sherrilyn Rist MD [place] [facility] Personal pager: See Amion If unanswered, please page CCM On-call: [phone]

## 2021-10-07 NOTE — Progress Notes (Signed)
Nutrition Follow-up  DOCUMENTATION CODES:   Severe malnutrition in context of chronic illness  INTERVENTION:  - will adjust TF regimen: Vital 1.5 @ 45 ml/hr with 90 ml Prosource TF BID. - this regimen will provide 1780 kcal (98% kcal), 117 grams protein, and 825 ml free water. - will decrease free water flush to 100 ml TID d/t extent of edema.   NUTRITION DIAGNOSIS:   Severe Malnutrition related to chronic illness (cancer) as evidenced by severe fat depletion, severe muscle depletion, edema. -ongoing  GOAL:   Patient will meet greater than or equal to 90% of their needs -met with TF  MONITOR:   Vent status, TF tolerance, Labs, Weight trends, Skin, I & O's  ASSESSMENT:   Pt with PMH of gastric cancer s/p chemo, HTN, DM, CAD, CHF, polysubstance abuse, and CKD stage 3b who was found hypothermic and hypoglycemic sitting in the rain for hours admitted with cardiogenic shock.  Patient discussed in rounds this AM. Plan for HD cath removal today. Patient was able to wean this AM. RN reports patient is tolerating TF at goal rate without issue.   He remains intubated with OGT in place. Estimated needs adjusted now that patient off of CRRT (stopped on 10/7) and based on admission weight of 70.8 kg as weight +13 lb since that date.    He is receiving Vital 1.5 @ 50 ml/hr with 90 ml Prosource TF QID and 200 ml free water every 4 hours. This regimen is providing 2120 kcal, 169 grams protein, and 2117 ml free water.   Moderate pitting edema to BUE and perineal area and very deep pitting edema to BLE documented in the edema section of flow sheet. He is noted to be +4.25 L since admission.   CCM MD note does indicate probable bowel ischemia in the setting of organ hypoperfusion; recommends continuing TF.    Patient is currently intubated on ventilator support MV: 9.5 L/min Temp (24hrs), Avg:98.1 F (36.7 C), Min:97.2 F (36.2 C), Max:99.2 F (37.3 C) Propofol: none BP: 105/66 and MAP:  78   Labs reviewed; CBGs: 210, 260, 203 mg/dl, Na: 147 mmol/l, K: 5.6 mmol/l, Cl: 116 mmol/l, BUN: 93 mg/dl, Ca: 8.3 mg/dl, Phos: 4.7 mg/dl.  Medications reviewed; 100 mg colace BID, 50 mg solu-cortef x3 doses, sliding scale novolog, 40 mg protonix per OGT/day, 17 g miralax/day, 5 g lokelma x1 dose 10/11.  Drip; fentanyl @ 200 mcg/hr.   Diet Order:   Diet Order             Diet NPO time specified  Diet effective now                   EDUCATION NEEDS:   Not appropriate for education at this time  Skin:  Skin Assessment: Reviewed RN Assessment (R foot non-pressure wound)  Last BM:  10/11 (type 6 x1)  Height:   Ht Readings from Last 1 Encounters:  10/02/2021 _0  (1.803 m)    Weight:   Wt Readings from Last 1 Encounters:  10/07/21 76.7 kg     Estimated Nutritional Needs:  Kcal:  1817 kcal Protein:  105-120 grams Fluid:  >/= 1.5 L/day      Jarome Matin, MS, RD, LDN, CNSC Inpatient Clinical Dietitian RD pager # available in AMION  After hours/weekend pager # available in [facility]

## 2021-10-08 ENCOUNTER — Inpatient Hospital Stay (HOSPITAL_COMMUNITY): Payer: Medicaid Other

## 2021-10-08 DIAGNOSIS — R570 Cardiogenic shock: Secondary | ICD-10-CM | POA: Diagnosis not present

## 2021-10-08 DIAGNOSIS — J810 Acute pulmonary edema: Secondary | ICD-10-CM | POA: Diagnosis not present

## 2021-10-08 DIAGNOSIS — E43 Unspecified severe protein-calorie malnutrition: Secondary | ICD-10-CM | POA: Diagnosis not present

## 2021-10-08 LAB — GLUCOSE, CAPILLARY
Glucose-Capillary: 134 mg/dL — ABNORMAL HIGH (ref 70–99)
Glucose-Capillary: 149 mg/dL — ABNORMAL HIGH (ref 70–99)
Glucose-Capillary: 153 mg/dL — ABNORMAL HIGH (ref 70–99)
Glucose-Capillary: 175 mg/dL — ABNORMAL HIGH (ref 70–99)
Glucose-Capillary: 190 mg/dL — ABNORMAL HIGH (ref 70–99)
Glucose-Capillary: 192 mg/dL — ABNORMAL HIGH (ref 70–99)
Glucose-Capillary: 213 mg/dL — ABNORMAL HIGH (ref 70–99)

## 2021-10-08 LAB — CBC WITH DIFFERENTIAL/PLATELET
Abs Immature Granulocytes: 0.14 10*3/uL — ABNORMAL HIGH (ref 0.00–0.07)
Basophils Absolute: 0 10*3/uL (ref 0.0–0.1)
Basophils Relative: 0 %
Eosinophils Absolute: 0 10*3/uL (ref 0.0–0.5)
Eosinophils Relative: 0 %
HCT: 36.5 % — ABNORMAL LOW (ref 39.0–52.0)
Hemoglobin: 11.5 g/dL — ABNORMAL LOW (ref 13.0–17.0)
Immature Granulocytes: 2 %
Lymphocytes Relative: 3 %
Lymphs Abs: 0.2 10*3/uL — ABNORMAL LOW (ref 0.7–4.0)
MCH: 29.8 pg (ref 26.0–34.0)
MCHC: 31.5 g/dL (ref 30.0–36.0)
MCV: 94.6 fL (ref 80.0–100.0)
Monocytes Absolute: 0.5 10*3/uL (ref 0.1–1.0)
Monocytes Relative: 7 %
Neutro Abs: 6.3 10*3/uL (ref 1.7–7.7)
Neutrophils Relative %: 88 %
Platelets: 58 10*3/uL — ABNORMAL LOW (ref 150–400)
RBC: 3.86 MIL/uL — ABNORMAL LOW (ref 4.22–5.81)
RDW: 22.6 % — ABNORMAL HIGH (ref 11.5–15.5)
WBC: 7.1 10*3/uL (ref 4.0–10.5)
nRBC: 0.7 % — ABNORMAL HIGH (ref 0.0–0.2)

## 2021-10-08 LAB — BASIC METABOLIC PANEL
Anion gap: 8 (ref 5–15)
Anion gap: 6 (ref 5–15)
BUN: 111 mg/dL — ABNORMAL HIGH (ref 6–20)
BUN: 122 mg/dL — ABNORMAL HIGH (ref 6–20)
CO2: 25 mmol/L (ref 22–32)
CO2: 27 mmol/L (ref 22–32)
Calcium: 8.6 mg/dL — ABNORMAL LOW (ref 8.9–10.3)
Calcium: 8.7 mg/dL — ABNORMAL LOW (ref 8.9–10.3)
Chloride: 114 mmol/L — ABNORMAL HIGH (ref 98–111)
Chloride: 121 mmol/L — ABNORMAL HIGH (ref 98–111)
Creatinine, Ser: 1.53 mg/dL — ABNORMAL HIGH (ref 0.61–1.24)
Creatinine, Ser: 1.53 mg/dL — ABNORMAL HIGH (ref 0.61–1.24)
GFR, Estimated: 53 mL/min — ABNORMAL LOW (ref >60)
GFR, Estimated: 53 mL/min — ABNORMAL LOW (ref >60)
Glucose, Bld: 250 mg/dL — ABNORMAL HIGH (ref 70–99)
Glucose, Bld: 178 mg/dL — ABNORMAL HIGH (ref 70–99)
Potassium: 6.4 mmol/L (ref 3.5–5.1)
Potassium: 6 mmol/L — ABNORMAL HIGH (ref 3.5–5.1)
Sodium: 147 mmol/L — ABNORMAL HIGH (ref 135–145)
Sodium: 154 mmol/L — ABNORMAL HIGH (ref 135–145)

## 2021-10-08 MED ORDER — INSULIN ASPART 100 UNIT/ML IV SOLN
10.0000 [IU] | Freq: Once | INTRAVENOUS | Status: AC
  Administered 2021-10-08: 10 [IU] via INTRAVENOUS

## 2021-10-08 MED ORDER — DEXTROSE 50 % IV SOLN
1.0000 | Freq: Once | INTRAVENOUS | Status: AC
  Administered 2021-10-08: 50 mL via INTRAVENOUS
  Filled 2021-10-08: qty 50

## 2021-10-08 MED ORDER — SODIUM CHLORIDE 0.45 % IV BOLUS
250.0000 mL | Freq: Once | INTRAVENOUS | Status: AC
  Administered 2021-10-08: 250 mL via INTRAVENOUS

## 2021-10-08 MED ORDER — SODIUM ZIRCONIUM CYCLOSILICATE 10 G PO PACK
10.0000 g | PACK | Freq: Every day | ORAL | Status: DC
  Administered 2021-10-08 – 2021-10-10 (×3): 10 g
  Filled 2021-10-08 (×3): qty 1

## 2021-10-08 NOTE — Progress Notes (Addendum)
NAME:  Tyler Wong, MRN:  [id number], DOB:  1966/01/04, LOS: 43 ADMISSION DATE:  10/13/2021, CONSULTATION DATE:  10/19/2021 REFERRING MD:  , CHIEF COMPLAINT:  SOB   BRIEF  55 year old male with history of Gastric cancer, HTN, DM type II, CAD, HFrEF (20%), Polysubstance abuse CKD stage 3b, arrived via EMS due to shortness of breath he was found to be hypothermic and hypoglycemic he was sitting in the rain in the cold for a few hour. On arrival: hypotensive with systolic of 62/94 with labs showing acute on chronic kidney disease with potassium of 5.9.  Patient was acidotic with pH of 7.27 and bicarb of 8.  Initial exam: agitated tachypneic he was on Levophed 40 mics and is being treated for hyperkalemia.  Patient was evaluated by nephrology and will continue medical management and possible need for CRRT if no improvement.  Patient was agitated very hard to get any history from.  Pertinent  Medical History  -Gastric cancer;  CR w/ Chemo. Lost to follow up  -Hypertension -Diabetes mellitus -CAD -Congestive heart failure ejection fraction 20% -Cocaine and polysubstance abuse -Diabetes mellitus -Cachexia lean body mass index -Polysubstance abuse with cocaine benzodiazepine and marijuana positivity multiple occasions  Significant Hospital Events: Including procedures, antibiotic start and stop dates in addition to other pertinent events   10/2: admit to inpatient.  Urine toxicology positive for cocaine 10/3: vanc and cefepime started. CVC, A line, and HD catheters placed. Start CRRT. Vasopressors x2 in addition to inotrope support  Goals of care discussed with family. Remains Full code per patient's wishes with family members PTA. 10/4: Continue CRRT, vasopressors x2 still high dose. Dobutamine at 7.5; lactic acid down to 1.9  10/5: wean off propofol. Continue CRRT. Co-ox dropping. Cvp and exam suggest euvolemic so changed volume goal to +50 cc an hour 10/6: TTE LVEF <20%, severely decreased  function with global hypokinesis and severe dilation.  Grade 2 diastolic dysfunction.  Moderate decreased RV function with normal size.  Moderately elevated PASP 52.5 mmHg.  Severely dilated left and right atria 10/7: CT head -no acute intracranial abnormality, chronic right cerebral and cerebellar infarct.  Minimally displaced fracture of the left zygomatic arch 10/7: Dobutamine decreased 7.5, CVVH was stopped 10/8: dobuta to 5, family meeting > no CPR, continue other care; unclear that he would want longterm care/LTAC, etc 10/10: Decreased dose of dobutamine 2.5 10/11: off dobutamine  Interim History / Subjective:   Tmax 101 Off pressors Remains off CRRT  Responsive, follows commands Hyperkalemia  Objective   Blood pressure 92/71, pulse (!) 112, temperature 98.7 F (37.1 C), temperature source Oral, resp. rate 20, height 5\' 11"  (1.803 m), weight 76.2 kg, SpO2 98 %. CVP:  [15 mmHg-38 mmHg] 28 mmHg  Vent Mode: PSV;CPAP FiO2 (%):  [30 %] 30 % Set Rate:  [20 bmp] 20 bmp Vt Set:  [450 mL] 450 mL PEEP:  [5 cmH20] 5 cmH20 Pressure Support:  [8 cmH20-10 cmH20] 10 cmH20 Plateau Pressure:  [12 cmH20-14 cmH20] 14 cmH20   Intake/Output Summary (Last 24 hours) at 10/08/2021 0852 Last data filed at 10/08/2021 7654 Gross per 24 hour  Intake 1864.85 ml  Output 250 ml  Net 1614.85 ml   Filed Weights   10/06/21 0400 10/07/21 0500 10/08/21 0500  Weight: 76.7 kg 76.7 kg 76.2 kg    Examination: Middle-aged, very frail General Appearance: Chronically ill-appearing, responsive  HEENT: Endotracheal tube in place, moist oral mucosa neck: Mild JVD, no adenopathy Lungs: Decreased air movement, clear Heart:  Systolic murmur at cardiac apex Abdomen: Bowel sounds appreciated Extremities: 2+ edema with dusky toes Skin: No skin rash Neurologic: Arousable, interactive, following commands-one-step commands  Resolved Hospital Problem list   Hyperglycemia Lactic acidosis (resolved 10/4) Life  threatening hyperkalemia (resolved 10/4)  Assessment & Plan:   Cardiogenic shock-present on admission Multiple organ failure Septic shock Supraventricular tachycardia-suspected related to inotropic/chronotropic effects He is off stress dose steroids at present Remains off pressors Home antihypertensives on hold including Entresto, metolazone, spironolactone, BiDil Completed antibiotics-7 days of meropenem, 2 days of vancomycin, off antibiotics since 10/9  Acute respiratory failure-present on admission -VAP prevention protocol in place -We will continue to wean as tolerated  Acute on chronic renal failure Chronic kidney disease stage IIIb-present on admission CVVHD was held 10/7 Renal service signed off 10/10 Continue to monitor output  Decreasing output -We will bolus with 250 0.45 saline  Hyperkalemia -Received Lokelma -Insulin and D50 -Repeat BMP at 1400 hrs.  Fever up to 101 yesterday -No leukocytosis -Will monitor  Oozing from site of HD catheter removal -Likely secondary to third spacing -Will monitor  Probable bowel ischemia in the setting of altered hypoperfusion -Lactulose clear -Continue tube feeding  Acute metabolic encephalopathy -Maintain RASS goal of 0 to -1 -Wean sedation as tolerated -Daily wake up assessment  Coagulopathy/DIC secondary to critical illness -Platelets improving  Anemia critical illness -Transfuse per protocol  Type 2 diabetes -Continue insulin  Prognosis remains guarded  Best Practice (right click and "Reselect all SmartList Selections" daily)   Diet/type: tubefeeds DVT prophylaxis: SCD GI prophylaxis: PPI Lines: Dialysis Catheter and No longer needed.  Order written to d/c  Foley:  Yes, and it is still needed Code Status:  full code Last date of multidisciplinary goals of care discussion 10/8   - [phone] Tyler Wong  -Updated sister 10/07/2021  Family meeting with sisters, multiple family  members in the ICU 10/8.  Confirmed that patient would want continued aggressive care to try to get over acute events, ultimately be able to be home, live independently.  All understand that prognosis remains guarded.  Unclear that patient would want continue dependent care, continued ICU care or LTAC care.  Would need to discuss further depending on overall prognosis.  I recommended that patient would not benefit from CPR if he were to have an acute arrest.  It was agreed to defer chest compressions under such circumstances, try cardioversion/defib and other ACLS maneuvers if felt they could be beneficial. Limited Code orders placed on chart.   ATTESTATION & SIGNATURE  The patient is critically ill with multiple organ systems failure and requires high complexity decision making for assessment and support, frequent evaluation and titration of therapies, application of advanced monitoring technologies and extensive interpretation of multiple databases. Critical Care Time devoted to patient care services described in this note independent of APP/resident time (if applicable)  is 38 minutes.   Sherrilyn Rist MD [place] [facility] Personal pager: See Amion If unanswered, please page CCM On-call: [phone]

## 2021-10-08 NOTE — Progress Notes (Addendum)
Received call from lab, Potassium level of 6.4 was collected this morning. Spoke with June, at [facility]  Will report to day time RN.

## 2021-10-08 NOTE — Progress Notes (Signed)
Bladder scan revealed 250 cc, will continue to monitor for urinary retention.

## 2021-10-09 DIAGNOSIS — R570 Cardiogenic shock: Secondary | ICD-10-CM | POA: Diagnosis not present

## 2021-10-09 DIAGNOSIS — N179 Acute kidney failure, unspecified: Secondary | ICD-10-CM | POA: Diagnosis not present

## 2021-10-09 DIAGNOSIS — J810 Acute pulmonary edema: Secondary | ICD-10-CM | POA: Diagnosis not present

## 2021-10-09 DIAGNOSIS — E43 Unspecified severe protein-calorie malnutrition: Secondary | ICD-10-CM | POA: Diagnosis not present

## 2021-10-09 LAB — GLUCOSE, CAPILLARY
Glucose-Capillary: 175 mg/dL — ABNORMAL HIGH (ref 70–99)
Glucose-Capillary: 200 mg/dL — ABNORMAL HIGH (ref 70–99)
Glucose-Capillary: 144 mg/dL — ABNORMAL HIGH (ref 70–99)
Glucose-Capillary: 173 mg/dL — ABNORMAL HIGH (ref 70–99)
Glucose-Capillary: 186 mg/dL — ABNORMAL HIGH (ref 70–99)
Glucose-Capillary: 110 mg/dL — ABNORMAL HIGH (ref 70–99)

## 2021-10-09 LAB — CBC WITH DIFFERENTIAL/PLATELET
Abs Immature Granulocytes: 0.18 10*3/uL — ABNORMAL HIGH (ref 0.00–0.07)
Basophils Absolute: 0 10*3/uL (ref 0.0–0.1)
Basophils Relative: 0 %
Eosinophils Absolute: 0 10*3/uL (ref 0.0–0.5)
Eosinophils Relative: 0 %
HCT: 36.4 % — ABNORMAL LOW (ref 39.0–52.0)
Hemoglobin: 11.6 g/dL — ABNORMAL LOW (ref 13.0–17.0)
Immature Granulocytes: 3 %
Lymphocytes Relative: 3 %
Lymphs Abs: 0.2 10*3/uL — ABNORMAL LOW (ref 0.7–4.0)
MCH: 30.1 pg (ref 26.0–34.0)
MCHC: 31.9 g/dL (ref 30.0–36.0)
MCV: 94.5 fL (ref 80.0–100.0)
Monocytes Absolute: 0.5 10*3/uL (ref 0.1–1.0)
Monocytes Relative: 8 %
Neutro Abs: 4.9 10*3/uL (ref 1.7–7.7)
Neutrophils Relative %: 86 %
Platelets: 70 10*3/uL — ABNORMAL LOW (ref 150–400)
RBC: 3.85 MIL/uL — ABNORMAL LOW (ref 4.22–5.81)
RDW: 22.7 % — ABNORMAL HIGH (ref 11.5–15.5)
WBC: 5.7 10*3/uL (ref 4.0–10.5)
nRBC: 1.8 % — ABNORMAL HIGH (ref 0.0–0.2)

## 2021-10-09 LAB — RENAL FUNCTION PANEL
Albumin: 2.4 g/dL — ABNORMAL LOW (ref 3.5–5.0)
Anion gap: 9 (ref 5–15)
BUN: 125 mg/dL — ABNORMAL HIGH (ref 6–20)
CO2: 23 mmol/L (ref 22–32)
Calcium: 8.2 mg/dL — ABNORMAL LOW (ref 8.9–10.3)
Chloride: 115 mmol/L — ABNORMAL HIGH (ref 98–111)
Creatinine, Ser: 1.56 mg/dL — ABNORMAL HIGH (ref 0.61–1.24)
GFR, Estimated: 52 mL/min — ABNORMAL LOW (ref >60)
Glucose, Bld: 221 mg/dL — ABNORMAL HIGH (ref 70–99)
Phosphorus: 3.9 mg/dL (ref 2.5–4.6)
Potassium: 6.4 mmol/L (ref 3.5–5.1)
Sodium: 147 mmol/L — ABNORMAL HIGH (ref 135–145)

## 2021-10-09 LAB — POTASSIUM
Potassium: 5.8 mmol/L — ABNORMAL HIGH (ref 3.5–5.1)
Potassium: 6.2 mmol/L — ABNORMAL HIGH (ref 3.5–5.1)
Potassium: 6.3 mmol/L (ref 3.5–5.1)
Potassium: 5.8 mmol/L — ABNORMAL HIGH (ref 3.5–5.1)

## 2021-10-09 MED ORDER — DEXTROSE 50 % IV SOLN
1.0000 | Freq: Once | INTRAVENOUS | Status: AC
  Administered 2021-10-09: 50 mL via INTRAVENOUS
  Filled 2021-10-09: qty 50

## 2021-10-09 MED ORDER — CHLORHEXIDINE GLUCONATE 0.12 % MT SOLN
OROMUCOSAL | Status: AC
  Filled 2021-10-09: qty 15

## 2021-10-09 MED ORDER — FREE WATER
200.0000 mL | Freq: Three times a day (TID) | Status: DC
  Administered 2021-10-09 – 2021-10-10 (×3): 200 mL

## 2021-10-09 MED ORDER — DEXMEDETOMIDINE HCL IN NACL 200 MCG/50ML IV SOLN
0.4000 ug/kg/h | INTRAVENOUS | Status: DC
  Administered 2021-10-09: 1 ug/kg/h via INTRAVENOUS
  Administered 2021-10-09 – 2021-10-10 (×3): 0.4 ug/kg/h via INTRAVENOUS
  Filled 2021-10-09 (×4): qty 50

## 2021-10-09 MED ORDER — INSULIN ASPART 100 UNIT/ML IV SOLN
5.0000 [IU] | Freq: Once | INTRAVENOUS | Status: AC
  Administered 2021-10-09: 5 [IU] via INTRAVENOUS

## 2021-10-09 MED ORDER — LACTATED RINGERS IV BOLUS
250.0000 mL | Freq: Once | INTRAVENOUS | Status: AC
  Administered 2021-10-09: 250 mL via INTRAVENOUS

## 2021-10-09 NOTE — Progress Notes (Signed)
[place] Progress Note Patient Name: Minard Millirons DOB: Feb 26, 1966 MRN: [id number]   Date of Service  10/09/2021  HPI/Events of Note  K 6.3  eICU Interventions  On lokelma daily  Ordered insulin 5 units and an amp of d50  CBG in high 100's     Intervention Category Minor Interventions: Electrolytes abnormality - evaluation and management  Tilden Dome 10/09/2021, 8:07 PM

## 2021-10-09 NOTE — Progress Notes (Signed)
[place] Progress Note Patient Name: Tyler Wong DOB: May 05, 1966 MRN: [id number]   Date of Service  10/09/2021  HPI/Events of Note  agitated  eICU Interventions  Precedex ordered      Intervention Category Minor Interventions: Agitation / anxiety - evaluation and management  Tilden Dome 10/09/2021, 9:14 PM

## 2021-10-09 NOTE — Progress Notes (Signed)
eLink Physician-Brief Progress Note Patient Name: Tyler Wong DOB: 1966/05/06 MRN: [id number]   Date of Service  10/09/2021  HPI/Events of Note  Notified of K 6.4 CRRT discontinued 10/7  eICU Interventions  D50/insulin ordered Already on daily Lokelma     Intervention Category Major Interventions: Electrolyte abnormality - evaluation and management  Judd Lien 10/09/2021, 6:53 AM

## 2021-10-09 NOTE — TOC Progression Note (Signed)
Transition of Care [facility]) - Progression Note   Patient Details  Name: Tyler Wong MRN: [id number] Date of Birth: Aug 13, 1966  Transition [facility]) CM/SW [place], LCSW Phone Number: 10/09/2021, 12:47 PM  Clinical Narrative: Patient has been declined by 20+ SNFs at this time. TOC awaiting bed offers.  Expected Discharge Plan: [place] Barriers to Discharge: Continued Medical Work up, No SNF bed  Expected Discharge Plan and Services Expected Discharge Plan: [place] Discharge Planning Services: CM Consult Living arrangements for the past 2 months: Group Home  Readmission Risk Interventions Readmission Risk Prevention Plan 10/02/2021 09/30/2021 06/20/2021  Transportation Screening Complete Complete Complete  Medication Review Press photographer) Complete Complete Complete  PCP or Specialist appointment within 3-5 days of discharge - Complete Complete  HRI or Home Care Consult Complete Complete Complete  SW Recovery Care/Counseling Consult Complete Complete Complete  Palliative Care Screening Not Applicable Not Applicable Not Applicable  Skilled Nursing Facility Complete Not Applicable Not Applicable  Some recent data might be hidden

## 2021-10-09 NOTE — Progress Notes (Signed)
NAME:  Tyler Wong, MRN:  [id number], DOB:  Oct 21, 1966, LOS: 43 ADMISSION DATE:  10/21/2021, CONSULTATION DATE:  10/05/2021 REFERRING MD:  , CHIEF COMPLAINT:  SOB   BRIEF  55 year old male with history of Gastric cancer, HTN, DM type II, CAD, HFrEF (20%), Polysubstance abuse CKD stage 3b, arrived via EMS due to shortness of breath he was found to be hypothermic and hypoglycemic he was sitting in the rain in the cold for a few hour. On arrival: hypotensive with systolic of 93/23 with labs showing acute on chronic kidney disease with potassium of 5.9.  Patient was acidotic with pH of 7.27 and bicarb of 8.  Initial exam: agitated tachypneic he was on Levophed 40 mics and is being treated for hyperkalemia.  Patient was evaluated by nephrology and will continue medical management and possible need for CRRT if no improvement.  Patient was agitated very hard to get any history from.  Pertinent  Medical History  -Gastric cancer;  CR w/ Chemo. Lost to follow up  -Hypertension -Diabetes mellitus -CAD -Congestive heart failure ejection fraction 20% -Cocaine and polysubstance abuse -Diabetes mellitus -Cachexia lean body mass index -Polysubstance abuse with cocaine benzodiazepine and marijuana positivity multiple occasions  Significant Hospital Events: Including procedures, antibiotic start and stop dates in addition to other pertinent events   10/2: admit to inpatient.  Urine toxicology positive for cocaine 10/3: vanc and cefepime started. CVC, A line, and HD catheters placed. Start CRRT. Vasopressors x2 in addition to inotrope support  Goals of care discussed with family. Remains Full code per patient's wishes with family members PTA. 10/4: Continue CRRT, vasopressors x2 still high dose. Dobutamine at 7.5; lactic acid down to 1.9  10/5: wean off propofol. Continue CRRT. Co-ox dropping. Cvp and exam suggest euvolemic so changed volume goal to +50 cc an hour 10/6: TTE LVEF <20%, severely decreased  function with global hypokinesis and severe dilation.  Grade 2 diastolic dysfunction.  Moderate decreased RV function with normal size.  Moderately elevated PASP 52.5 mmHg.  Severely dilated left and right atria 10/7: CT head -no acute intracranial abnormality, chronic right cerebral and cerebellar infarct.  Minimally displaced fracture of the left zygomatic arch 10/7: Dobutamine decreased 7.5, CVVH was stopped 10/8: dobuta to 5, family meeting > no CPR, continue other care; unclear that he would want longterm care/LTAC, etc 10/10: Decreased dose of dobutamine 2.5 10/11: off dobutamine  Interim History / Subjective:   No fevers, off pressors Remains off CRRT Hypokalemia  He is responsive, follows commands, required increased sedation today Tolerating some weaning  Objective   Blood pressure (!) 88/62, pulse (!) 110, temperature 98.3 F (36.8 C), temperature source Axillary, resp. rate 20, height 5\' 11"  (1.803 m), weight 76.2 kg, SpO2 96 %. CVP:  [21 mmHg-36 mmHg] 31 mmHg  Vent Mode: CPAP;PSV FiO2 (%):  [30 %-40 %] 40 % Set Rate:  [20 bmp] 20 bmp Vt Set:  [450 mL] 450 mL PEEP:  [5 cmH20] 5 cmH20 Pressure Support:  [10 cmH20] 10 cmH20 Plateau Pressure:  [13 cmH20-16 cmH20] 15 cmH20   Intake/Output Summary (Last 24 hours) at 10/09/2021 0946 Last data filed at 10/09/2021 5573 Gross per 24 hour  Intake 1406.98 ml  Output 500 ml  Net 906.98 ml   Filed Weights   10/07/21 0500 10/08/21 0500 10/09/21 0500  Weight: 76.7 kg 76.2 kg 76.2 kg    Examination: Middle-aged, very frail General Appearance: Chronically ill-appearing, responsive HEENT: Endotracheal tube in place, moist oral mucosa  neck:  Mild JVD, no adenopathy Lungs: Decreased air movement bilaterally, clear breath sounds Heart: systolic murmur at the apex Abdomen: Bowel sounds appreciated Extremities: 2+ edema bilaterally, dusky toes bilaterally Skin: No skin rash Neurologic: Arousable, follows one-step commands  Labs  reviewed significant for hypokalemia-being addressed Resolved Hospital Problem list   Hyperglycemia Lactic acidosis (resolved 10/4) Life threatening hyperkalemia (resolved 10/4)  Assessment & Plan:   Cardiogenic shock-present on admission Multiple organ failure Septic shock Supraventricular tachycardia -Use of pressors at present -Weaned off stress dose steroids -Antihypertensives remain on hold including home Entresto, metolazone, spironolactone, BiDil Completed antibiotics on 10/9-received meropenem and vancomycin -Afebrile, no leukocytosis  Acute respiratory failure-present on admission -VAP prevention protocol in place -Continue to wean as tolerated  Acute on chronic renal failure Chronic kidney disease stage IIIb-present on admission He did receive CVVHD-held 10/7 Good urine output BUN and creatinine did increase but stabilizing -Renal service signed off 10/10 -Continue to monitor output -Continue to monitor electrolytes  Electrolyte derangements including hyperkalemia, hypernatremia -We will continue to replete -Increase free water  Fever -Resolved  Oozing from site of HD catheter removal -Secondary to third spacing  Probable bowel ischemia in the setting of altered hypoperfusion -Lactic acid did clear -Tolerating tube feeds  Acute metabolic encephalopathy -Maintain RASS goal of -1  Coagulopathy/DIC secondary to critical illness -Platelets improving  Anemia critical illness -Transfuse per protocol  Type 2 diabetes -Continue insulin  Prognosis remains guarded  Discussed with his sister Alethia Berthold at bedside-10/09/2021   Best Practice (right click and "Reselect all SmartList Selections" daily)   Diet/type: tubefeeds DVT prophylaxis: SCD GI prophylaxis: PPI Lines: Dialysis Catheter and No longer needed.  Order written to d/c  Foley:  Yes, and it is still needed Code Status:  full code Last date of multidisciplinary goals of care discussion  10/8   - [phone] Effie  -Updated sister 10/07/2021  Family meeting with sisters, multiple family members in the ICU 10/8.  Confirmed that patient would want continued aggressive care to try to get over acute events, ultimately be able to be home, live independently.  All understand that prognosis remains guarded.  Unclear that patient would want continue dependent care, continued ICU care or LTAC care.  Would need to discuss further depending on overall prognosis.  I recommended that patient would not benefit from CPR if he were to have an acute arrest.  It was agreed to defer chest compressions under such circumstances, try cardioversion/defib and other ACLS maneuvers if felt they could be beneficial. Limited Code orders placed on chart.   ATTESTATION & SIGNATURE   The patient is critically ill with multiple organ systems failure and requires high complexity decision making for assessment and support, frequent evaluation and titration of therapies, application of advanced monitoring technologies and extensive interpretation of multiple databases. Critical Care Time devoted to patient care services described in this note independent of APP/resident time (if applicable)  is 45 minutes.   Sherrilyn Rist MD [facility] Personal pager: See Amion If unanswered, please page CCM On-call: [phone]

## 2021-10-10 ENCOUNTER — Inpatient Hospital Stay (HOSPITAL_COMMUNITY): Payer: Medicaid Other

## 2021-10-10 DIAGNOSIS — J9601 Acute respiratory failure with hypoxia: Secondary | ICD-10-CM | POA: Diagnosis not present

## 2021-10-10 DIAGNOSIS — R570 Cardiogenic shock: Secondary | ICD-10-CM | POA: Diagnosis not present

## 2021-10-10 LAB — LACTIC ACID, PLASMA: Lactic Acid, Venous: 4.8 mmol/L (ref 0.5–1.9)

## 2021-10-10 LAB — CBC WITH DIFFERENTIAL/PLATELET
Abs Immature Granulocytes: 0.02 10*3/uL (ref 0.00–0.07)
Basophils Absolute: 0 10*3/uL (ref 0.0–0.1)
Basophils Relative: 0 %
Eosinophils Absolute: 0 10*3/uL (ref 0.0–0.5)
Eosinophils Relative: 0 %
HCT: 33.5 % — ABNORMAL LOW (ref 39.0–52.0)
Hemoglobin: 10.7 g/dL — ABNORMAL LOW (ref 13.0–17.0)
Immature Granulocytes: 3 %
Lymphocytes Relative: 6 %
Lymphs Abs: 0 10*3/uL — ABNORMAL LOW (ref 0.7–4.0)
MCH: 30.1 pg (ref 26.0–34.0)
MCHC: 31.9 g/dL (ref 30.0–36.0)
MCV: 94.1 fL (ref 80.0–100.0)
Monocytes Absolute: 0.1 10*3/uL (ref 0.1–1.0)
Monocytes Relative: 8 %
Neutro Abs: 0.5 10*3/uL — ABNORMAL LOW (ref 1.7–7.7)
Neutrophils Relative %: 83 %
Platelets: 69 10*3/uL — ABNORMAL LOW (ref 150–400)
RBC: 3.56 MIL/uL — ABNORMAL LOW (ref 4.22–5.81)
RDW: 22.5 % — ABNORMAL HIGH (ref 11.5–15.5)
WBC: 0.6 10*3/uL — CL (ref 4.0–10.5)
nRBC: 120.3 % — ABNORMAL HIGH (ref 0.0–0.2)

## 2021-10-10 LAB — BLOOD GAS, ARTERIAL
Acid-base deficit: 6.9 mmol/L — ABNORMAL HIGH (ref 0.0–2.0)
Bicarbonate: 20 mmol/L (ref 20.0–28.0)
Drawn by: 22052
FIO2: 90
MECHVT: 450 mL
O2 Saturation: 77.8 %
Patient temperature: 98.6
RATE: 28 resp/min
pCO2 arterial: 48.4 mmHg — ABNORMAL HIGH (ref 32.0–48.0)
pH, Arterial: 7.239 — ABNORMAL LOW (ref 7.350–7.450)
pO2, Arterial: 53.4 mmHg — ABNORMAL LOW (ref 83.0–108.0)

## 2021-10-10 LAB — POTASSIUM
Potassium: 5.2 mmol/L — ABNORMAL HIGH (ref 3.5–5.1)
Potassium: 5.3 mmol/L — ABNORMAL HIGH (ref 3.5–5.1)

## 2021-10-10 LAB — BASIC METABOLIC PANEL
Anion gap: 9 (ref 5–15)
BUN: 132 mg/dL — ABNORMAL HIGH (ref 6–20)
CO2: 20 mmol/L — ABNORMAL LOW (ref 22–32)
Calcium: 7.8 mg/dL — ABNORMAL LOW (ref 8.9–10.3)
Chloride: 119 mmol/L — ABNORMAL HIGH (ref 98–111)
Creatinine, Ser: 2.02 mg/dL — ABNORMAL HIGH (ref 0.61–1.24)
GFR, Estimated: 38 mL/min — ABNORMAL LOW (ref >60)
Glucose, Bld: 91 mg/dL (ref 70–99)
Potassium: 5.6 mmol/L — ABNORMAL HIGH (ref 3.5–5.1)
Sodium: 148 mmol/L — ABNORMAL HIGH (ref 135–145)

## 2021-10-10 LAB — CORTISOL: Cortisol, Plasma: >100 ug/dL

## 2021-10-10 LAB — PROCALCITONIN: Procalcitonin: 54.29 ng/mL

## 2021-10-10 MED ORDER — SODIUM CHLORIDE 0.9 % IV SOLN: 2.0000 g | Freq: Two times a day (BID) | INTRAVENOUS | Status: DC

## 2021-10-10 MED ORDER — DOBUTAMINE IN D5W 4-5 MG/ML-% IV SOLN
INTRAVENOUS | Status: AC
  Filled 2021-10-10: qty 250

## 2021-10-10 MED ORDER — DOBUTAMINE IN D5W 4-5 MG/ML-% IV SOLN
2.5000 ug/kg/min | INTRAVENOUS | Status: DC
  Administered 2021-10-10: 2.5 ug/kg/min via INTRAVENOUS

## 2021-10-10 MED ORDER — LACTATED RINGERS IV BOLUS
500.0000 mL | Freq: Once | INTRAVENOUS | Status: AC
  Administered 2021-10-10: 500 mL via INTRAVENOUS

## 2021-10-10 MED ORDER — METOLAZONE 5 MG PO TABS
5.0000 mg | ORAL_TABLET | Freq: Once | ORAL | Status: AC
  Administered 2021-10-10: 5 mg
  Filled 2021-10-10: qty 1

## 2021-10-10 MED ORDER — FUROSEMIDE 10 MG/ML IJ SOLN
80.0000 mg | Freq: Once | INTRAMUSCULAR | Status: AC
  Administered 2021-10-10: 80 mg via INTRAVENOUS
  Filled 2021-10-10: qty 8

## 2021-10-10 MED ORDER — VANCOMYCIN HCL 1500 MG/300ML IV SOLN
1500.0000 mg | Freq: Once | INTRAVENOUS | Status: DC
  Filled 2021-10-10: qty 300

## 2021-10-10 MED ORDER — NOREPINEPHRINE 16 MG/250ML-% IV SOLN
0.0000 ug/min | INTRAVENOUS | Status: DC
  Administered 2021-10-10: 2 ug/min via INTRAVENOUS
  Filled 2021-10-10: qty 250

## 2021-10-10 MED ORDER — SODIUM CHLORIDE 0.9 % IV SOLN: INTRAVENOUS | Status: DC | PRN

## 2021-10-10 MED ORDER — IPRATROPIUM-ALBUTEROL 0.5-2.5 (3) MG/3ML IN SOLN
3.0000 mL | Freq: Four times a day (QID) | RESPIRATORY_TRACT | Status: DC
  Administered 2021-10-10: 3 mL via RESPIRATORY_TRACT
  Filled 2021-10-10: qty 3

## 2021-10-10 MED ORDER — VASOPRESSIN 20 UNITS/100 ML INFUSION FOR SHOCK
0.0000 [IU]/min | INTRAVENOUS | Status: DC
  Administered 2021-10-10: 0.03 [IU]/min via INTRAVENOUS
  Filled 2021-10-10: qty 100

## 2021-10-10 MED ORDER — SODIUM CHLORIDE 0.9 % IV SOLN
2.0000 g | Freq: Once | INTRAVENOUS | Status: AC
  Administered 2021-10-10: 2 g via INTRAVENOUS
  Filled 2021-10-10: qty 2

## 2021-10-12 LAB — CULTURE, RESPIRATORY W GRAM STAIN: Gram Stain: NONE SEEN

## 2021-10-15 LAB — GLUCOSE, CAPILLARY
Glucose-Capillary: 85 mg/dL (ref 70–99)
Glucose-Capillary: 86 mg/dL (ref 70–99)

## 2021-10-28 NOTE — Progress Notes (Signed)
Date and time results received: 10/31/2021 0900 (use smartphrase ".now" to insert current time)  Test: WBC and LA Critical Value:  WBC 0.6 (redrawn and confirmed) Lactic Acid 4.6   Name of Provider Notified: Dewald  Orders Received? Or Actions Taken?:  Blood cultures drawn, antibiotics restarted

## 2021-10-28 NOTE — Progress Notes (Signed)
NAME:  Tyler Wong, MRN:  [id number], DOB:  Mar 12, 1966, LOS: 2 ADMISSION DATE:  10/02/2021, CONSULTATION DATE:  10/06/2021 REFERRING MD:   CHIEF COMPLAINT:  SOB   History of Present Illness:  55 year old male with history of Gastric cancer, HTN, DM type II, CAD, HFrEF (20%), Polysubstance abuse CKD stage 3b, arrived via EMS due to shortness of breath he was found to be hypothermic and hypoglycemic he was sitting in the rain in the cold for a few hour. On arrival: hypotensive with systolic of 52/84 with labs showing acute on chronic kidney disease with potassium of 5.9.  Patient was acidotic with pH of 7.27 and bicarb of 8.  Initial exam: agitated tachypneic he was on Levophed 40 mics and is being treated for hyperkalemia.  Patient was evaluated by nephrology and will continue medical management and possible need for CRRT if no improvement.  Patient was agitated very hard to get any history from.  Pertinent  Medical History  -Gastric cancer;  CR w/ Chemo. Lost to follow up  -Hypertension -Diabetes mellitus -CAD -Congestive heart failure ejection fraction 20% -Cocaine and polysubstance abuse -Diabetes mellitus -Cachexia lean body mass index -Polysubstance abuse with cocaine benzodiazepine and marijuana positivity multiple occasions  Significant Hospital Events: Including procedures, antibiotic start and stop dates in addition to other pertinent events   10/2: admit to inpatient.  Urine toxicology positive for cocaine 10/3: vanc and cefepime started. CVC, A line, and HD catheters placed. Start CRRT. Vasopressors x2 in addition to inotrope support  Goals of care discussed with family. Remains Full code per patient's wishes with family members PTA. 10/4: Continue CRRT, vasopressors x2 still high dose. Dobutamine at 7.5; lactic acid down to 1.9  10/5: wean off propofol. Continue CRRT. Co-ox dropping. Cvp and exam suggest euvolemic so changed volume goal to +50 cc an hour 10/6: TTE LVEF <20%,  severely decreased function with global hypokinesis and severe dilation.  Grade 2 diastolic dysfunction.  Moderate decreased RV function with normal size.  Moderately elevated PASP 52.5 mmHg.  Severely dilated left and right atria 10/7: CT head -no acute intracranial abnormality, chronic right cerebral and cerebellar infarct.  Minimally displaced fracture of the left zygomatic arch 10/7: Dobutamine decreased 7.5, CVVH was stopped 10/8: dobuta to 5, family meeting > no CPR, continue other care; unclear that he would want longterm care/LTAC, etc 10/10: Decreased dose of dobutamine 2.5 10/11: off dobutamine  Interim History / Subjective:   Patient developed pressor requirement overnight. He has been started on levophed and vasopressin. He was given 574mL bolus with no improvement in pressor requirements.  He is agonal breathing this morning and he is not alert or following commands.   He is 3+ liters positive over the last couple of days.   Objective   Blood pressure (!) 94/38, pulse (!) 116, temperature 100.2 F (37.9 C), temperature source Axillary, resp. rate (!) 26, height 5\' 11"  (1.803 m), weight 80.5 kg, SpO2 94 %. CVP:  [7 mmHg-31 mmHg] 8 mmHg  Vent Mode: PRVC FiO2 (%):  [30 %-90 %] 90 % Set Rate:  [20 bmp] 20 bmp Vt Set:  [450 mL] 450 mL PEEP:  [5 cmH20] 5 cmH20 Pressure Support:  [10 cmH20] 10 cmH20 Plateau Pressure:  [11 cmH20-17 cmH20] 11 cmH20   Intake/Output Summary (Last 24 hours) at 2021-10-21 0739 Last data filed at October 21, 2021 0400 Gross per 24 hour  Intake 3221.44 ml  Output 1260 ml  Net 1961.44 ml   [employer]  10/08/21 0500 10/09/21 0500 12-Oct-2021 0455  Weight: 76.2 kg 76.2 kg 80.5 kg    Examination: General: chronically ill appearing, cachectic male, moderate respiratory distress HENT: PERRL, sclera anicteric, moist mucous membranes Lungs: course breath sounds bilaterally. No wheezing. Cardiovascular: Tachycardic. No murmurs. Abdomen: soft,  non-distended. Extremities: 2+ edema lower extremities Neuro: eyes open but not following commands GU: external foley in place  Resolved Hospital Problem list     Assessment & Plan:  Cardiogenic shock-present on admission Septic shock Multiple organ failure Supraventricular tachycardia - Started on levophed and vasopressin overnight. - re-start dobutamine for cardiogenic shock - Give lasix 80mg  IV once - Check lactic acid and random cortisol -Antihypertensives remain on hold including home Entresto, metolazone, spironolactone, BiDil Completed antibiotics on 10/9-received meropenem and vancomycin   Acute respiratory failure-present on admission -VAP prevention protocol in place -Start cefepime and vancomycin for pneumonia coverage, chest radiograph 10/14 showing dense RLL consolidation concerning for possible pneumonia. Check procalcitonin - Check ABG - Increase peep to 8 from 5  Acute on chronic renal failure Chronic kidney disease stage IIIb-present on admission He did receive CVVHD-10/3 to 10/7 Urine output significantly decreased since 10/11 -Renal service signed off 10/10 -Continue to monitor output -Continue to monitor electrolytes - Give lasix 80mg  IV this AM - Check urinalysis this AM  Electrolyte derangements including hyperkalemia, hypernatremia -We will continue to replete -Increase free water   Oozing from site of HD catheter removal -Secondary to third spacing   Probable bowel ischemia in the setting of altered hypoperfusion -Lactic acid did clear previously. -has been tolerating tube feeds  Acute metabolic encephalopathy -Maintain RASS goal of -1   Coagulopathy/DIC secondary to critical illness Thrombocytopenia -Platelets improving   Anemia critical illness -Transfuse per protocol  Type 2 diabetes -Continue insulin  Best Practice (right click and "Reselect all SmartList Selections" daily)   Diet/type: tubefeeds DVT prophylaxis: prophylactic  heparin  GI prophylaxis: PPI Lines: Central line Foley:  Yes, and it is still needed Code Status:  limited Last date of multidisciplinary goals of care discussion Aletha Halim update patient's family today 10/14 about his acute status change over the past 24 hours]  Labs   CBC: Recent Labs  Lab 10/04/21 0200 10/05/21 0508 10/06/21 0402 10/08/21 0500 10/09/21 0542  WBC 11.4* 10.6* 8.7 7.1 5.7  NEUTROABS  --   --   --  6.3 4.9  HGB 11.2* 10.7* 10.1* 11.5* 11.6*  HCT 33.5* 31.6* 31.0* 36.5* 36.4*  MCV 89.3 88.5 91.7 94.6 94.5  PLT 20* 35* 40* 58* 70*    Basic Metabolic Panel: Recent Labs  Lab 10/04/21 0200 10/05/21 0508 10/06/21 0402 10/07/21 0505 10/08/21 0500 10/08/21 1345 10/09/21 0542 10/09/21 1051 10/09/21 1435 10/09/21 1816 10/09/21 2248 2021/10/12 0228  NA 142  137 142 150* 147* 147* 154* 147*  --   --   --   --   --   K 4.3  4.2 4.1 4.5 5.6* 6.4* 6.0* 6.4* 5.8* 6.2* 6.3* 5.8* 5.2*  CL 109  104 110 119* 116* 114* 121* 115*  --   --   --   --   --   CO2 27  26 25 27 28 25 27 23   --   --   --   --   --   GLUCOSE 205*  200* 201* 219* 247* 250* 178* 221*  --   --   --   --   --   BUN 47*  47* 69* 76* 93* 111* 122*  125*  --   --   --   --   --   CREATININE 1.20  1.12 1.33* 1.25* 1.20 1.53* 1.53* 1.56*  --   --   --   --   --   CALCIUM 8.1*  7.8* 7.9* 8.4* 8.3* 8.6* 8.7* 8.2*  --   --   --   --   --   MG 2.4  --   --   --   --   --   --   --   --   --   --   --   PHOS 3.8  3.6 4.2 3.9 4.7*  --   --  3.9  --   --   --   --   --    GFR: Estimated Creatinine Clearance: 57 mL/min (A) (by C-G formula based on SCr of 1.56 mg/dL (H)). Recent Labs  Lab 10/04/21 0200 10/05/21 0508 10/06/21 0402 10/08/21 0500 10/09/21 0542  WBC 11.4* 10.6* 8.7 7.1 5.7  LATICACIDVEN 1.2  --   --   --   --     Liver Function Tests: Recent Labs  Lab 10/04/21 0200 10/05/21 0508 10/06/21 0402 10/07/21 0505 10/09/21 0542  ALBUMIN 2.2* 2.2* 2.1* 2.5* 2.4*   No results for  input(s): LIPASE, AMYLASE in the last 168 hours. No results for input(s): AMMONIA in the last 168 hours.  ABG    Component Value Date/Time   PHART 7.358 10/04/2021 0432   PCO2ART 45.8 10/04/2021 0432   PO2ART 98.8 10/04/2021 0432   HCO3 25.1 10/04/2021 0432   TCO2 13 (L) 10/19/2021 1955   ACIDBASEDEF 0.1 10/04/2021 0432   O2SAT 84.1 10/06/2021 0402     Coagulation Profile: No results for input(s): INR, PROTIME in the last 168 hours.  Cardiac Enzymes: No results for input(s): CKTOTAL, CKMB, CKMBINDEX, TROPONINI in the last 168 hours.  HbA1C: Hgb A1c MFr Bld  Date/Time Value Ref Range Status  07/23/2021 03:25 AM 6.8 (H) 4.8 - 5.6 % Final    Comment:    (NOTE)         Prediabetes: 5.7 - 6.4         Diabetes: >6.4         Glycemic control for adults with diabetes: <7.0   04/12/2020 04:03 AM 6.5 (H) 4.8 - 5.6 % Final    Comment:    (NOTE) Pre diabetes:          5.7%-6.4% Diabetes:              >6.4% Glycemic control for   <7.0% adults with diabetes     CBG: Recent Labs  Lab 10/09/21 0732 10/09/21 1139 10/09/21 1511 10/09/21 1957 10/09/21 2327  GLUCAP 200* 144* 173* 186* 110*      Critical care time: 63 minutes    Freda Jackson, MD [facility] Office: [phone]   See Amion for personal pager PCCM on call pager [phone] until 7pm. Please call Elink 7p-7a. [phone]

## 2021-10-28 NOTE — Progress Notes (Signed)
[facility] Progress Note Patient Name: Tyler Wong DOB: Sep 26, 1966 MRN: [id number]   Date of Service  Oct 23, 2021  HPI/Events of Note  MAP again a bit low  eICU Interventions  Trying an LR bolus; if not effective, then will start a vasopressor     Intervention Category Intermediate Interventions: Hypotension - evaluation and management  Tilden Dome 2021/10/23, 2:09 AM

## 2021-10-28 NOTE — Significant Event (Signed)
  Patient with progressive deteriorating hemodynamics this morning despite being maxed on 3 pressors with SBP 50-60's and starting to have rhythm changes of bradycardia and varying intermittent blocks.  No improvement with bicarb.  Pulse ox not picking up given poor perfusion.  Remains agonal on MV.  Sister is now at bedside and updated her on events.  [name] states she knew this was coming and is tearful. Support ongoing provided.  Remainder of family has been called in however I suspect he will pass on his own, on full support.  He has been a limited code with no CPR.  For now, we will continue current care and vasopressor support without any further escalation in care to hopefully buy time for local family to arrive.  She is ok restarting fentanyl for comfort.       Kennieth Rad, ACNP [facility] 11-05-21, 11:00 AM  See Amion for pager If no response to pager, please call PCCM consult pager After 7:00 pm call Elink

## 2021-10-28 NOTE — Progress Notes (Signed)
[facility] Progress Note Patient Name: Tyler Wong DOB: [birth year] MRN: [id number]   Date of Service  10/27/2021  HPI/Events of Note  MAP still low  eICU Interventions  Vaso added     Intervention Category Intermediate Interventions: Hypotension - evaluation and management  Tilden Dome 10/27/21, 5:45 AM

## 2021-10-28 NOTE — Progress Notes (Signed)
HR  rapidly dropped down to 60s from 110s along with SBP down to 60s. Pressors increased per orders. MD and NP notified. Pulse ox continues to read low in the 60s. MD called family to update on deteriorating hemodynamics. Family to come to bedside.

## 2021-10-28 NOTE — Progress Notes (Signed)
Unable to pick up consistent or reliable pulse ox on monitor. Concern for low SpO2. MD made aware. ABG to verify oxygen levels.

## 2021-10-28 NOTE — Progress Notes (Signed)
Chaplain rec'd page for EOL.  Patient declining and two sisters bedside, Angelia and Peter Congo. Third sister is Magda Paganini. Other family on the way. Patient is one of five siblings. He has two daughters. He was a "free spirit."  Chaplain offered prayer and ministry of support for his sisters and will be available to other family if needed.  Chaplain provided box of tissues to them as requested.  Rev. Tamsen Snider Pager [phone]

## 2021-10-28 NOTE — Progress Notes (Signed)
Pt belongings (wallet, pipe, cigarettes, lighter) noted to be left on the unit after family had left. This RN reached out to [name] (sister) via phone [phone]. [name] will be by to pick up belongings by the end of the day.

## 2021-10-28 NOTE — Progress Notes (Signed)
Pt belongings (wallet, pipe, cigarettes, lighter) noted to be left on the unit after family had left yesterday. Patient's sister Alethia Berthold, picked up patient's belongings.

## 2021-10-28 NOTE — Progress Notes (Signed)
Family at bedside. Patient current condition, vital signs, and prognosis discussed with sister [name] at bedside. Pt HR and BP continued to drop. Family acknowledged that patient "had fought hard and was ready to go". Family decision was made to allow him to pass. Family remained at bedside as pt's heart stopped on full support from ventilator and pressors. TOD [id number] called by Leonie Man, RN and Nelva Bush, RN. Family remains appropriate at bedside.

## 2021-10-28 NOTE — Progress Notes (Signed)
[facility] Progress Note Patient Name: Tyler Wong DOB: [birth year] MRN: [id number]   Date of Service  Oct 17, 2021  HPI/Events of Note  MAP still low despite fluid bolus  eICU Interventions  Starting levo     Intervention Category Intermediate Interventions: Hypotension - evaluation and management  Tilden Dome 10-17-2021, 3:12 AM

## 2021-10-28 NOTE — Progress Notes (Addendum)
Pharmacy Antibiotic Note  Tyler Wong is a 55 y.o. male admitted on 10/25/2021.  Pharmacy has been consulted for vancomycin & cefepime dosing for PNA.  2021/10/11 AF, WBC 5.7@ 0542>> down to 0.6 @ 0815, ANC 0.5 - neutropenic, SCr 1.56> 2.02. new hypotension overnight requiring levophed & vasopressin. Also re-started on dobutamine for cardiogenic shock.  CXR: dense RLL consolidation concerning for PNA.  PCT 54. Lactate 4.8  Plan: Cefepime 2 gm IV q12 Vancomycin 1500 mg IV x 1 then 1000 mg IV q24 for est AUC 445, Css min 11.4, used SCr 2.02, Wt 76.2 kg (from 10/13 - volume overloaded today) Re-check MRSA PCR Will f/u renal function, WBC, temp, culture data  Height: 5\' 11"  (180.3 cm) Weight: 80.5 kg (177 lb 7.5 oz) IBW/kg (Calculated) : 75.3  Temp (24hrs), Avg:99.1 F (37.3 C), Min:98 F (36.7 C), Max:100.2 F (37.9 C)  Recent Labs  Lab 10/04/21 0200 10/05/21 0508 10/06/21 0402 10/07/21 0505 10/08/21 0500 10/08/21 1345 10/09/21 0542  WBC 11.4* 10.6* 8.7  --  7.1  --  5.7  CREATININE 1.20  1.12 1.33* 1.25* 1.20 1.53* 1.53* 1.56*  LATICACIDVEN 1.2  --   --   --   --   --   --   VANCORANDOM  --  11  --   --   --   --   --     Estimated Creatinine Clearance: 57 mL/min (A) (by C-G formula based on SCr of 1.56 mg/dL (H)).    No Known Allergies Antimicrobials this admission: 10/2 Ceftriaxone x 1 10/2 Azithromycin x 1 10/2 Vancomycin >> 10/3 10/2 Cefepime >>10/3 resumed 10/14>> 10/3 Zosyn >> 10/5 10/5 meropenem >> 10/9 10/7 vancomycin >>10/9  resumed 10/14>> Dose adjustments this admission: 10/9 VR = 11 - ok to d/c abx per MD Microbiology results: 10/2 BCx:  ngtF 10/2 UCx:   ngF 10/3 MRSA PCR: not detected 10/14 MRSA:   Thank you for allowing pharmacy to be a part of this patient's care.  Eudelia Bunch, Pharm.D 10-11-21 8:29 AM

## 2021-10-28 NOTE — Death Summary Note (Signed)
DEATH SUMMARY   Patient Details  Name: Tyler Wong MRN: [id number] DOB: May 03, 1966  Admission/Discharge Information   Admit Date:  Oct 25, 2021  Date of Death: Date of Death: 11-06-21  Time of Death: Time of Death: 04/09/1129  Length of Stay: 2023-04-05  Referring Physician: [name], NP   Reason(s) for Hospitalization  Cardiogenic Shock  Diagnoses  Preliminary cause of death:  Septic Shock due to Pneumonia Cardiogenic Shock  Secondary Diagnoses (including complications and co-morbidities):  Active Problems:   Cardiogenic shock (HCC)   AKI (acute kidney injury) ([place])   Protein-calorie malnutrition, severe   Pulmonary edema Polysubstance Abuse Acute on Chronic Heart Failure Supraventricular Tachycardia Acute Hypoxemic Respiratory Failure Acute Metabolic Encephalopathy Coagulopathy Anemia Leukopenia Type II Diabetes  Brief Hospital Course (including significant findings, care, treatment, and services provided and events leading to death)  Tyler Wong is a 55 y.o. year old male with history of gastric cancer, hypertension, diabetes mellitus type 2, coronary artery disease, heart failure reduced ejection fraction of 20%, polysubstance abuse, CKD stage IIIb who arrived to [facility] hospital on 10-25-2021 due to shortness of breath.  He was found to be hypothermic and hypoglycemic as he was found outside sitting in the rain.  He was found to be in cardiogenic shock likely exacerbated from cocaine use.  He was also treated with broad-spectrum antibiotics for concern of infection.  Patient was intubated due to respiratory failure.  He was started on continuous renal replacement therapy due to acute renal failure and oliguria.  Repeat echo on 10/6 showed LVEF less than 20%.  He required vasopressor therapy which included dobutamine.  He remained on continuous renal replacement therapy until 10/7.  Dobutamine was weaned off on 10/11.  Patient was tolerating spontaneous breathing trial on  10/13.  Overnight on 11-07-2023 he developed progressive shock and respiratory failure concern for volume overload versus new pneumonia leading to recurrent septic/cardiogenic shock.  His condition continued decline throughout the morning of 2023-11-07 in which his family was notified.  Patient's CODE STATUS was a DNR and unfortunately given his rapid decline he passed away at 11:30am on November 06, 2021.  Pertinent Labs and Studies  Significant Diagnostic Studies CT ABDOMEN PELVIS WO CONTRAST  Result Date: 09/29/2021 CLINICAL DATA:  Abdominal pain and fever. History of gastric cancer and chronic kidney disease. EXAM: CT ABDOMEN AND PELVIS WITHOUT CONTRAST TECHNIQUE: Multidetector CT imaging of the abdomen and pelvis was performed following the standard protocol without IV contrast. COMPARISON:  CT abdomen and pelvis 06/17/2021. FINDINGS: Lower chest: The heart is enlarged, unchanged. There are small bilateral pleural effusions. There is minimal atelectasis in the left lung base. There is also small pericardial effusion. Hepatobiliary: Calcified granulomas again noted in the liver. The liver is otherwise within normal limits. Gallbladder is grossly within normal limits. No calcified gallstones are seen. Pancreas: Unremarkable. No pancreatic ductal dilatation or surrounding inflammatory changes. Spleen: Normal in size without focal abnormality. Adrenals/Urinary Tract: There is nonspecific bilateral adrenal thickening. There is no hydronephrosis. There is no urinary tract calculus identified. The bladder is decompressed by Foley catheter. Stomach/Bowel: There is diffuse colonic wall thickening. The appendix appears within normal limits. There is no bowel obstruction or pneumatosis. There is no free air. Small bowel and stomach are grossly within normal limits. Vascular/Lymphatic: No significant vascular findings are present. No enlarged abdominal or pelvic lymph nodes. Reproductive: Prostate is unremarkable. Other: There is a  small to moderate amount of ascites throughout the abdomen and pelvis. There is diffuse body wall  edema. Musculoskeletal: No acute or significant osseous findings. IMPRESSION: 1. Diffuse colonic wall thickening worrisome for nonspecific colitis. No free air or bowel obstruction. 2. Small to moderate amount of ascites.  Body wall edema. 3. Small pleural effusions. 4. Stable cardiomegaly with new small pericardial effusion. Electronically Signed   By: Ronney Asters M.D.   On: 09/29/2021 00:00   DG Chest 2 View  Result Date: 10/05/2021 CLINICAL DATA:  Shortness of breath. EXAM: CHEST - 2 VIEW COMPARISON:  Chest x-ray 07/22/2021. FINDINGS: Left chest port catheter tip projects over the SVC, unchanged. The heart is markedly enlarged, unchanged. There is some linear atelectasis in the right upper lung. There is no focal lung infiltrate, pleural effusion or pneumothorax identified. The osseous structures are within normal limits. IMPRESSION: 1. Stable cardiomegaly. 2. No evidence for pneumonia or edema. Electronically Signed   By: Ronney Asters M.D.   On: 10/04/2021 18:48   DG Abd 1 View  Result Date: 09/29/2021 CLINICAL DATA:  Status post orogastric tube placement. EXAM: ABDOMEN - 1 VIEW COMPARISON:  None. FINDINGS: An orogastric tube is seen with its distal tip noted within the body of the stomach. The distal side hole is approximately 13 cm distal to the gastroesophageal junction. The bowel gas pattern is normal. No radio-opaque calculi or other significant radiographic abnormality are seen. IMPRESSION: Orogastric tube positioning, as described above. Electronically Signed   By: Virgina Norfolk M.D.   On: 09/29/2021 01:09   CT HEAD WO CONTRAST (5MM)  Result Date: 10/03/2021 CLINICAL DATA:  55 year old male with encephalopathy, delirium. Metabolic acidosis, shock. EXAM: CT HEAD WITHOUT CONTRAST TECHNIQUE: Contiguous axial images were obtained from the base of the skull through the vertex without intravenous  contrast. COMPARISON:  Head CT 10/22/2021. FINDINGS: Brain: Encephalomalacia at the junction of the inferior right parietal, posterior temporal and occipital lobes appears unchanged. And a small chronic infarct in the right cerebellum also appears stable. Elsewhere gray-white matter differentiation is stable and within normal limits. No midline shift, ventriculomegaly, mass effect, evidence of mass lesion, intracranial hemorrhage or evidence of cortically based acute infarction. Vascular: Calcified atherosclerosis at the skull base. Tortuous basilar artery. No suspicious intracranial vascular hyperdensity. Skull: Calvarium appears stable and negative. A minimally displaced fracture of the left zygomatic arch is stable. Regional soft tissue gas has resolved. Sinuses/Orbits: Visualized paranasal sinuses and mastoids are clear. Other: Intubated on the scout view. Visualized orbits and scalp soft tissues are within normal limits. IMPRESSION: 1. No acute intracranial abnormality, with a stable non contrast CT appearance of what appear to be chronic right cerebral and cerebellar infarcts. 2. Minimally displaced fracture of the left zygomatic arch. Electronically Signed   By: Genevie Ann M.D.   On: 10/03/2021 09:13   CT HEAD WO CONTRAST (5MM)  Result Date: 09/29/2021 CLINICAL DATA:  Altered mental status EXAM: CT HEAD WITHOUT CONTRAST TECHNIQUE: Contiguous axial images were obtained from the base of the skull through the vertex without intravenous contrast. COMPARISON:  None. FINDINGS: Brain: No evidence of acute infarction, hemorrhage, hydrocephalus, extra-axial collection or mass lesion/mass effect. Area of chronic ischemic change is noted in the right temporoparietal region. Vascular: No hyperdense vessel or unexpected calcification. Skull: Normal. Negative for fracture or focal lesion. Sinuses/Orbits: No acute finding. Other: Mildly displaced left zygomatic arch fracture is noted with some subcutaneous emphysema. Nasal  bone fracture on the left is noted as well. IMPRESSION: Chronic white matter ischemic change. No acute intracranial abnormality noted. Left nasal bone fracture and left zygomatic  arch fracture with subcutaneous edema identified. No significant callus is noted in these likely represent acute to subacute fractures. Electronically Signed   By: Inez Catalina M.D.   On: 09/29/2021 00:05   US RENAL  Result Date: 09/29/2021 CLINICAL DATA:  Acute kidney injury EXAM: RENAL / URINARY TRACT ULTRASOUND COMPLETE COMPARISON:  Abdominal CT from yesterday FINDINGS: Right Kidney: Renal measurements: 10 x 4.4 x 5.2 cm = volume: 120 mL. Borderline increased echogenicity. No hydronephrosis. Negative for mass Left Kidney: Renal measurements: 10 x 4.8 x 4.6 cm = volume: 115 mL. Echogenicity within normal limits. No mass or hydronephrosis visualized. Bladder: Collapsed around a Foley catheter. Other: Small volume ascites also seen on prior abdominal CT. IMPRESSION: No hydronephrosis or other specific cause for renal failure. Electronically Signed   By: Jorje Guild M.D.   On: 09/29/2021 06:30   DG CHEST PORT 1 VIEW  Result Date: 2021-10-14 CLINICAL DATA:  Respiratory failure. EXAM: PORTABLE CHEST 1 VIEW COMPARISON:  10/08/2021 FINDINGS: Endotracheal tube in good position. Port-A-Cath tip in the SVC unchanged. Right subclavian central venous catheter tip also in the SVC unchanged. NG tube in the stomach. Progression of bibasilar airspace disease and bilateral effusions. Pulmonary vascular congestion is present with possible mild edema. IMPRESSION: Central lines remain in good position Progressive bibasilar airspace disease which is likely atelectasis however could be pneumonia. Progression of bilateral effusions. Progressive pulmonary vascular congestion suggesting fluid overload. Electronically Signed   By: Franchot Gallo M.D.   On: 10/14/21 08:16   DG Chest Port 1 View  Result Date: 10/08/2021 CLINICAL DATA:   Respiratory failure EXAM: PORTABLE CHEST 1 VIEW COMPARISON:  Radiograph 10/06/2021 FINDINGS: Endotracheal tube tip overlies the midthoracic trachea. Unchanged left chest port and right approach central venous catheters. Nasogastric tube passes below the diaphragm, tip excluded by collimation. Unchanged, enlarged cardiac silhouette. Unchanged bibasilar airspace disease and small left pleural effusion. No visible pneumothorax. Bones are unchanged. IMPRESSION: Unchanged bibasilar airspace disease and small left pleural effusion. Stable support lines and tubes. Electronically Signed   By: Maurine Simmering M.D.   On: 10/08/2021 07:55   DG Chest Port 1 View  Result Date: 10/06/2021 CLINICAL DATA:  Acute respiratory failure EXAM: PORTABLE CHEST 1 VIEW COMPARISON:  Radiograph 10/03/2021 FINDINGS: Endotracheal tube overlies the midthoracic trachea. Nasogastric tube passes below the diaphragm, tip excluded by collimation. Unchanged left chest port and right approach central venous catheters. Unchanged cardiomediastinal silhouette. There is a small left pleural effusion with adjacent left basilar atelectasis, unchanged. Hazy right lower lung opacity, unchanged. No visible pneumothorax. No acute osseous abnormality. IMPRESSION: Stable small left pleural effusion and adjacent basilar consolidation. Stable support lines and tubes. Electronically Signed   By: Maurine Simmering M.D.   On: 10/06/2021 08:40   DG CHEST PORT 1 VIEW  Result Date: 10/03/2021 EXAM: PORTABLE CHEST 1 VIEW COMPARISON:  None. FINDINGS: Endotracheal tube, NG tube, central venous line, and porta unchanged. Stable cardiac silhouette. LEFT basilar atelectasis and effusion unchanged. No pneumothorax. No pulmonary edema. IMPRESSION: 1. Stable support apparatus. 2. LEFT basilar atelectasis and effusion. Electronically Signed   By: Suzy Bouchard M.D.   On: 10/03/2021 08:22   DG Chest Port 1 View  Result Date: 09/30/2021 CLINICAL DATA:  ETT, pulmonary edema EXAM:  PORTABLE CHEST 1 VIEW COMPARISON:  Chest radiograph dated 1 day prior FINDINGS: The endotracheal tube tip is approximately 5.4 cm from the carina. A right-sided central venous catheter and left chest wall port both terminate in the region of  the cavoatrial junction. Enteric catheter courses off the field of view. The heart is enlarged, unchanged. The mediastinal contours are stable. There is unchanged dense retrocardiac opacity with a left pleural effusion. The left upper lung is well-aerated. The right lung is clear. There is no right effusion. There is no pneumothorax. The bones are stable. IMPRESSION: 1. Unchanged left pleural effusion with adjacent airspace disease. 2. Unchanged cardiomegaly. Electronically Signed   By: Valetta Mole M.D.   On: 09/30/2021 11:59   DG CHEST PORT 1 VIEW  Result Date: 09/29/2021 CLINICAL DATA:  Status post intubation EXAM: PORTABLE CHEST 1 VIEW COMPARISON:  Earlier today FINDINGS: Interval placement of right subclavian central venous catheter with tip in the projection of the SVC. No pneumothorax. The left chest wall port a catheter tip is at the cavoatrial junction. ETT tip is above the carina. There is a nasogastric tube with tip below the GE junction. Heart size and mediastinal contours are stable. There is diminished aeration to the retrocardiac left lung base which may represent atelectasis, pleural effusion or airspace disease. IMPRESSION: 1. Interval placement of right subclavian central venous catheter with tip in the projection of the SVC. No pneumothorax. 2. Decreased aeration to the retrocardiac left lung base. Electronically Signed   By: Kerby Moors M.D.   On: 09/29/2021 10:25   DG CHEST PORT 1 VIEW  Result Date: 09/29/2021 CLINICAL DATA:  Status post intubation EXAM: PORTABLE CHEST 1 VIEW COMPARISON:  10/27/2021 FINDINGS: Endotracheal tube is again identified in satisfactory position. Gastric catheter extends into the stomach. Left chest wall port is noted at  the cavoatrial junction. Cardiac shadow remains enlarged. The lungs are clear. No bony abnormality is noted. IMPRESSION: Tubes and lines as described. Stable cardiomegaly. No other focal abnormality is noted. Electronically Signed   By: Inez Catalina M.D.   On: 09/29/2021 03:24   DG Chest Portable 1 View  Result Date: 10/15/2021 CLINICAL DATA:  Acute respiratory failure.  Intubation. EXAM: PORTABLE CHEST 1 VIEW COMPARISON:  Prior today FINDINGS: A new endotracheal tube is seen in place with tip approximately 6 cm above the carina. Left-sided power port remains in appropriate position. Moderate cardiomegaly is stable.  Both lungs remain clear. IMPRESSION: New endotracheal tube tip approximately 6 cm above the carina. Stable cardiomegaly. No active lung disease. Electronically Signed   By: Marlaine Hind M.D.   On: 10/20/2021 23:29   DG Chest Port 1 View  Result Date: 10/20/2021 CLINICAL DATA:  Chest pain and shortness of breath. EXAM: PORTABLE CHEST 1 VIEW COMPARISON:  Prior today FINDINGS: Cardiomegaly remains stable. Both lungs are clear. No evidence of pneumothorax or pleural effusion. Left-sided power port remains in appropriate position. IMPRESSION: Stable cardiomegaly. No active lung disease. Electronically Signed   By: Marlaine Hind M.D.   On: 10/02/2021 22:08   ECHOCARDIOGRAM COMPLETE  Result Date: 10/02/2021    ECHOCARDIOGRAM REPORT   Patient Name:   ARVID MARENGO Date of Exam: 10/02/2021 Medical Rec #:  [id number]   Height:       71.0 in Accession #:    [id number]  Weight:       163.8 lb Date of Birth:  04-Sep-1966  BSA:          1.937 m Patient Age:    86 years    BP:           86/54 mmHg Patient Gender: M           HR:  97 bpm. Exam Location:  Inpatient Procedure: 2D Echo, 3D Echo, Cardiac Doppler and Color Doppler Indications:    I50.40* Unspecified combined systolic (congestive) and diastolic                 (congestive) heart failure; I42.9 Cardiomyopathy (unspecified)  History:         Patient has prior history of Echocardiogram examinations, most                 recent 10/04/2020. CHF, CAD, COPD, Signs/Symptoms:Chest Pain;                 Risk Factors:Diabetes, Hypertension and Current Smoker. Cocaine                 use. Shock. Edema.  Sonographer:    Roseanna Rainbow RDCS Referring Phys: [place]  1. Left ventricular ejection fraction, by estimation, is <20%. Left ventricular ejection fraction by 3D volume is 18 %. The left ventricle has severely decreased function. The left ventricle demonstrates global hypokinesis. The left ventricular internal  cavity size was severely dilated. Left ventricular diastolic parameters are consistent with Grade II diastolic dysfunction (pseudonormalization).  2. Right ventricular systolic function is moderately reduced. The right ventricular size is normal. There is moderately elevated pulmonary artery systolic pressure. The estimated right ventricular systolic pressure is 29.7 mmHg.  3. Left atrial size was severely dilated.  4. Right atrial size was severely dilated.  5. The mitral valve is grossly normal. Severe mitral valve regurgitation.  6. Tricuspid valve regurgitation is severe.  7. The aortic valve is normal in structure. Aortic valve regurgitation is not visualized. No aortic stenosis is present. FINDINGS  Left Ventricle: Left ventricular ejection fraction, by estimation, is <20%. Left ventricular ejection fraction by 3D volume is 18 %. The left ventricle has severely decreased function. The left ventricle demonstrates global hypokinesis. The left ventricular internal cavity size was severely dilated. There is borderline left ventricular hypertrophy. Left ventricular diastolic parameters are consistent with Grade II diastolic dysfunction (pseudonormalization). Right Ventricle: The right ventricular size is normal. Right vetricular wall thickness was not well visualized. Right ventricular systolic function is moderately reduced. There  is moderately elevated pulmonary artery systolic pressure. The tricuspid regurgitant velocity is 3.06 m/s, and with an assumed right atrial pressure of 15 mmHg, the estimated right ventricular systolic pressure is 98.9 mmHg. Left Atrium: Left atrial size was severely dilated. Right Atrium: Right atrial size was severely dilated. Pericardium: There is no evidence of pericardial effusion. Mitral Valve: The mitral valve is grossly normal. Severe mitral valve regurgitation. Tricuspid Valve: The tricuspid valve is normal in structure. Tricuspid valve regurgitation is severe. Aortic Valve: The aortic valve is normal in structure. Aortic valve regurgitation is not visualized. No aortic stenosis is present. Pulmonic Valve: The pulmonic valve was grossly normal. Pulmonic valve regurgitation is trivial. Aorta: The aortic root and ascending aorta are structurally normal, with no evidence of dilitation. IAS/Shunts: The atrial septum is grossly normal.  LEFT VENTRICLE PLAX 2D LVIDd:         6.55 cm         Diastology LVIDs:         6.40 cm         LV e' medial:    3.59 cm/s LV PW:         1.15 cm         LV E/e' medial:  29.2 LV IVS:        0.85  cm         LV e' lateral:   11.10 cm/s LVOT diam:     1.90 cm         LV E/e' lateral: 9.5 LV SV:         35 LV SV Index:   18 LVOT Area:     2.84 cm        3D Volume EF                                LV 3D EF:    Left                                             ventricul LV Volumes (MOD)                            ar LV vol d, MOD    260.0 ml                   ejection A2C:                                        fraction LV vol d, MOD    274.0 ml                   by 3D A4C:                                        volume is LV vol s, MOD    218.0 ml                   18 %. A2C: LV vol s, MOD    244.0 ml A4C:                           3D Volume EF: LV SV MOD A2C:   42.0 ml       3D EF:        18 % LV SV MOD A4C:   274.0 ml      LV EDV:       310 ml LV SV MOD BP:    39.5 ml       LV ESV:        253 ml                                LV SV:        57 ml RIGHT VENTRICLE             IVC RV S prime:     11.60 cm/s  IVC diam: 3.40 cm TAPSE (M-mode): 1.5 cm LEFT ATRIUM              Index        RIGHT ATRIUM           Index LA diam:        3.70 cm  1.91 cm/m   RA Area:  32.50 cm LA Vol (A2C):   101.0 ml 52.14 ml/m  RA Volume:   133.00 ml 68.66 ml/m LA Vol (A4C):   105.0 ml 54.21 ml/m LA Biplane Vol: 106.0 ml 54.72 ml/m  AORTIC VALVE LVOT Vmax:   98.50 cm/s LVOT Vmean:  59.200 cm/s LVOT VTI:    0.122 m  AORTA Ao Root diam: 3.60 cm Ao Asc diam:  3.00 cm MITRAL VALVE                  TRICUSPID VALVE MV Area (PHT): 5.84 cm       TR Peak grad:   37.5 mmHg MV Decel Time: 130 msec       TR Vmax:        306.00 cm/s MR Peak grad:    104.4 mmHg MR Mean grad:    63.0 mmHg    SHUNTS MR Vmax:         511.00 cm/s  Systemic VTI:  0.12 m MR Vmean:        372.0 cm/s   Systemic Diam: 1.90 cm MR PISA:         2.26 cm MR PISA Eff ROA: 17 mm MR PISA Radius:  0.60 cm MV E velocity: 105.00 cm/s MV A velocity: 76.50 cm/s MV E/A ratio:  1.37 Mertie Moores MD Electronically signed by Mertie Moores MD Signature Date/Time: 10/02/2021/4:14:43 PM    Final    VAS [place] LOWER EXTREMITY VENOUS (DVT)  Result Date: 09/30/2021  Lower Venous DVT Study Patient Name:  EULAN HEYWARD  Date of Exam:   09/30/2021 Medical Rec #: [id number]    Accession #:    [id number] Date of Birth: 1966-07-16   Patient Gender: M Patient Age:   58 years Exam Location:  [facility] Procedure:      VAS [place] LOWER EXTREMITY VENOUS (DVT) Referring Phys: MURALI RAMASWAMY --------------------------------------------------------------------------------  Indications: Edema.  Risk Factors: None identified. Limitations: Bandages, line and patient positioning, patient immobility. Comparison Study: No prior studies. Performing Technologist: Oliver Hum RVT  Examination Guidelines: A complete evaluation includes B-mode imaging, spectral Doppler, color Doppler,  and power Doppler as needed of all accessible portions of each vessel. Bilateral testing is considered an integral part of a complete examination. Limited examinations for reoccurring indications may be performed as noted. The reflux portion of the exam is performed with the patient in reverse Trendelenburg.  +---------+---------------+---------+-----------+----------+-------------------+ RIGHT    CompressibilityPhasicitySpontaneityPropertiesThrombus Aging      +---------+---------------+---------+-----------+----------+-------------------+ CFV                                                   Not well visualized +---------+---------------+---------+-----------+----------+-------------------+ SFJ                                                   Not well visualized +---------+---------------+---------+-----------+----------+-------------------+ FV Prox                                               Not well visualized +---------+---------------+---------+-----------+----------+-------------------+ FV Mid   Full  Yes      No                                       +---------+---------------+---------+-----------+----------+-------------------+ FV DistalFull                                                             +---------+---------------+---------+-----------+----------+-------------------+ PFV                                                   Not well visualized +---------+---------------+---------+-----------+----------+-------------------+ POP      Full           Yes      No                                       +---------+---------------+---------+-----------+----------+-------------------+ PTV      Full                                                             +---------+---------------+---------+-----------+----------+-------------------+ PERO     Full                                                              +---------+---------------+---------+-----------+----------+-------------------+   +---------+---------------+---------+-----------+----------+--------------+ LEFT     CompressibilityPhasicitySpontaneityPropertiesThrombus Aging +---------+---------------+---------+-----------+----------+--------------+ CFV      Full           Yes      No                                  +---------+---------------+---------+-----------+----------+--------------+ SFJ      Full                                                        +---------+---------------+---------+-----------+----------+--------------+ FV Prox  Full                                                        +---------+---------------+---------+-----------+----------+--------------+ FV Mid   Full                                                        +---------+---------------+---------+-----------+----------+--------------+  FV DistalFull                                                        +---------+---------------+---------+-----------+----------+--------------+ PFV      Full                                                        +---------+---------------+---------+-----------+----------+--------------+ POP      Full           Yes      No                                  +---------+---------------+---------+-----------+----------+--------------+ PTV      Full                                                        +---------+---------------+---------+-----------+----------+--------------+ PERO     Full                                                        +---------+---------------+---------+-----------+----------+--------------+     Summary: RIGHT: - There is no evidence of deep vein thrombosis in the lower extremity. However, portions of this examination were limited- see technologist comments above.  - No cystic structure found in the popliteal fossa.  LEFT: - There is no evidence of deep  vein thrombosis in the lower extremity.  - No cystic structure found in the popliteal fossa.  *See table(s) above for measurements and observations. Electronically signed by Deitra Mayo MD on 09/30/2021 at 5:31:57 PM.    Final     Microbiology Recent Results (from the past 240 hour(s))  Culture, Respiratory w Gram Stain     Status: None (Preliminary result)   Collection Time: 2021-10-12  8:01 AM   Specimen: Tracheal Aspirate; Respiratory  Result Value Ref Range Status   Specimen Description   Final    TRACHEAL ASPIRATE Performed at [facility] [address], [place], [place] [address]    Special Requests   Final    NONE Performed at [facility], [place] [address] [place], [place] [address]    Gram Stain   Final    NO WBC SEEN FEW GRAM POSITIVE COCCI FEW GRAM NEGATIVE RODS    Culture   Final    CULTURE REINCUBATED FOR BETTER GROWTH Performed at [facility], [place] [address] [place], [place] [address]    Report Status PENDING  Incomplete    Lab Basic Metabolic Panel: Recent Labs  Lab 10/05/21 0508 10/06/21 0402 10/07/21 0505 10/08/21 0500 10/08/21 1345 10/09/21 0542 10/09/21 1051 10/09/21 1816 10/09/21 2248 10-12-21 0228 10-12-2021 0635 10-12-21 0815  NA 142 150* 147* 147* 154* 147*  --   --   --   --   --  148*  K 4.1 4.5 5.6* 6.4* 6.0* 6.4*   < > 6.3* 5.8* 5.2* 5.3* 5.6*  CL 110 119* 116* 114* 121* 115*  --   --   --   --   --  119*  CO2 25 27 28 25 27 23   --   --   --   --   --  20*  GLUCOSE 201* 219* 247* 250* 178* 221*  --   --   --   --   --  91  BUN 69* 76* 93* 111* 122* 125*  --   --   --   --   --  132*  CREATININE 1.33* 1.25* 1.20 1.53* 1.53* 1.56*  --   --   --   --   --  2.02*  CALCIUM 7.9* 8.4* 8.3* 8.6* 8.7* 8.2*  --   --   --   --   --  7.8*  PHOS 4.2 3.9 4.7*  --   --  3.9  --   --   --   --   --   --    < > = values in this interval not displayed.   Liver Function Tests: Recent Labs  Lab  10/05/21 0508 10/06/21 0402 10/07/21 0505 10/09/21 0542  ALBUMIN 2.2* 2.1* 2.5* 2.4*   No results for input(s): LIPASE, AMYLASE in the last 168 hours. No results for input(s): AMMONIA in the last 168 hours. CBC: Recent Labs  Lab 10/05/21 0508 10/06/21 0402 10/08/21 0500 10/09/21 0542 21-Oct-2021 0815  WBC 10.6* 8.7 7.1 5.7 0.6*  NEUTROABS  --   --  6.3 4.9 0.5*  HGB 10.7* 10.1* 11.5* 11.6* 10.7*  HCT 31.6* 31.0* 36.5* 36.4* 33.5*  MCV 88.5 91.7 94.6 94.5 94.1  PLT 35* 40* 58* 70* 69*   Cardiac Enzymes: No results for input(s): CKTOTAL, CKMB, CKMBINDEX, TROPONINI in the last 168 hours. Sepsis Labs: Recent Labs  Lab 10/06/21 0402 10/08/21 0500 10/09/21 0542 10/21/21 0815  PROCALCITON  --   --   --  54.29  WBC 8.7 7.1 5.7 0.6*  LATICACIDVEN  --   --   --  4.8*    Procedures/Operations  Vascath placement Endotracheal tube placement Arterial Line placement   Freddi Starr 10/11/2021, 2:53 PM

## 2021-10-28 DEATH — deceased

## 2022-02-07 IMAGING — CT CT ABD-PELV W/O CM
2 of 4 series · 16 of 46 positions shown, 18 images · non-contrast
Comparison: Ultrasound abdomen 06/17/2021, CT abdomen pelvis
06/16/2021, CT abdomen pelvis 02/20/2021, PET CT 11/28/2020

CLINICAL DATA: Sepsis Abdominal pain, acute, nonlocalized. Dx with
"abdominal" cancer and had PET scan yesterday and oncologist called
him today and said he has an inflamed gal bladder and that he should
go to the ED

EXAM:
CT ABDOMEN AND PELVIS WITHOUT CONTRAST
TECHNIQUE: Multidetector CT imaging of the abdomen and pelvis was performed
following the standard protocol without IV contrast.

[Series 3: a/p w/o 5mm · axial · non-contrast · 0.77mm/px · z∈[+820,+1245]mm · 13 of 93 slices shown, 15 images]
[im 4/93  soft-tissue]
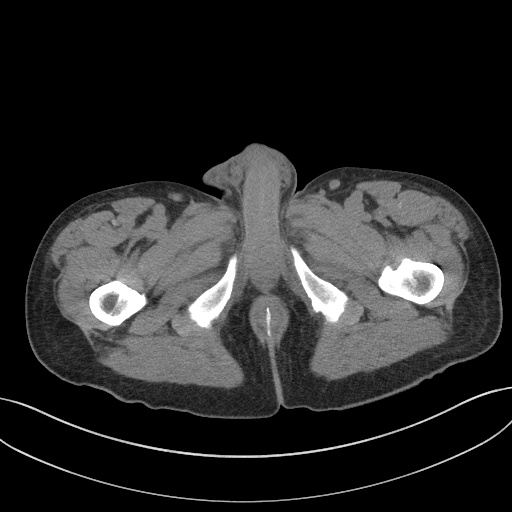
[im 4/93  bone]
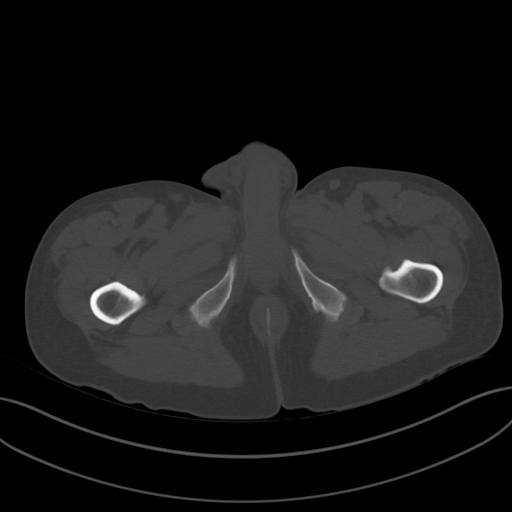
[im 12/93  soft-tissue]
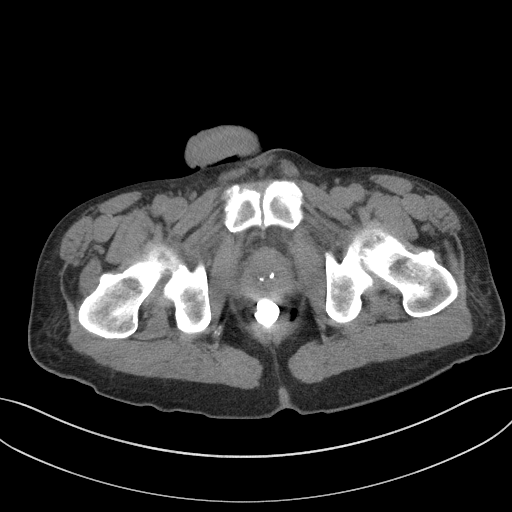
[im 19/93  soft-tissue]
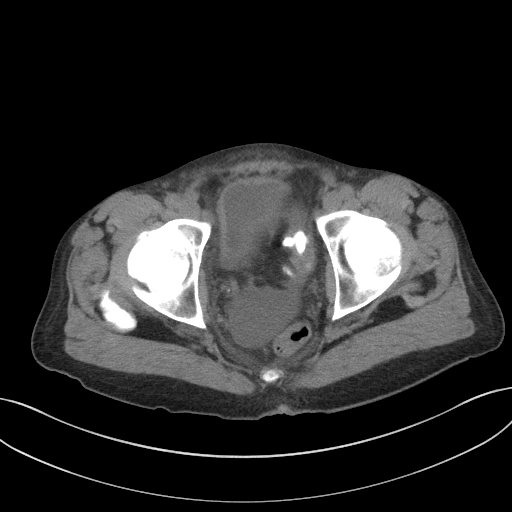
[im 26/93  soft-tissue]
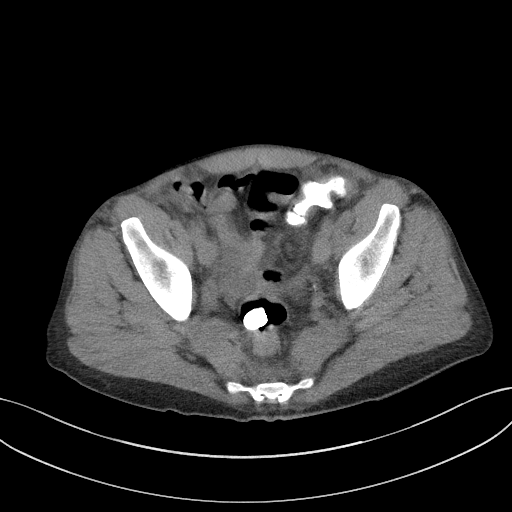
[im 34/93  soft-tissue]
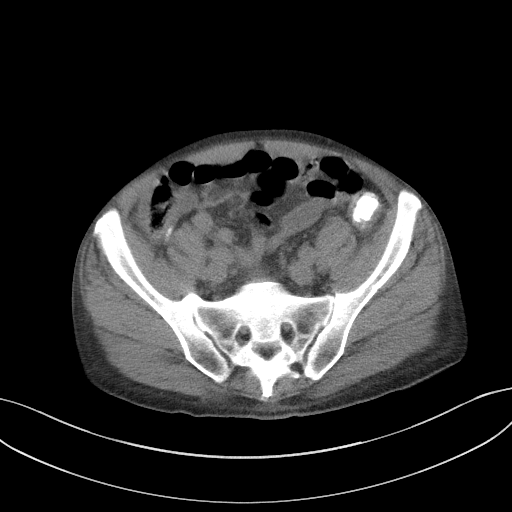
[im 41/93  soft-tissue]
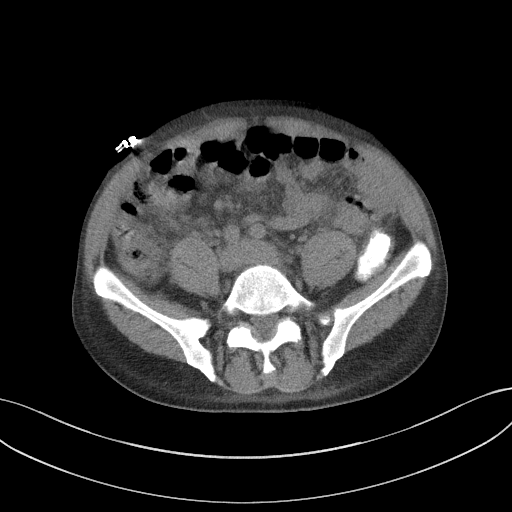
[im 48/93  soft-tissue]
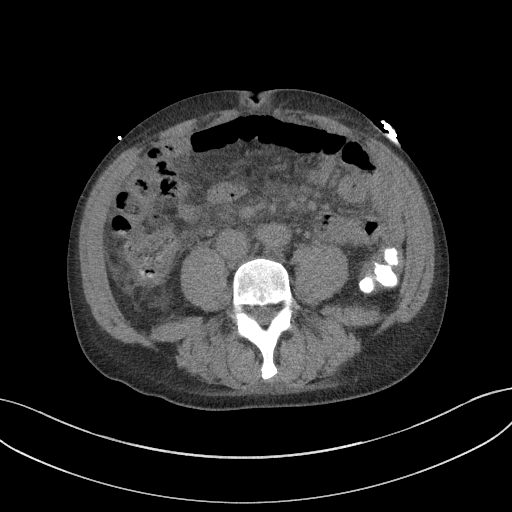
[im 52/93  soft-tissue]
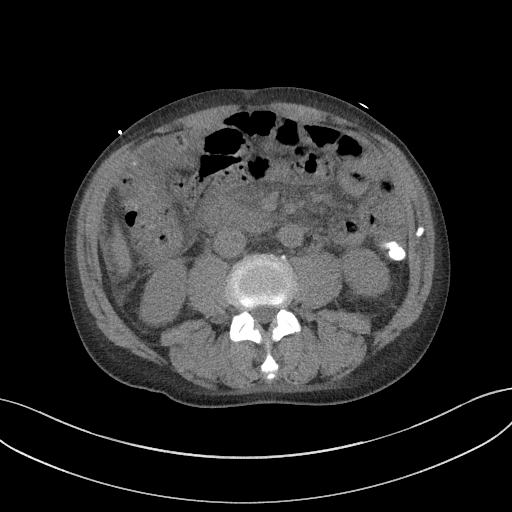
[im 59/93  soft-tissue]
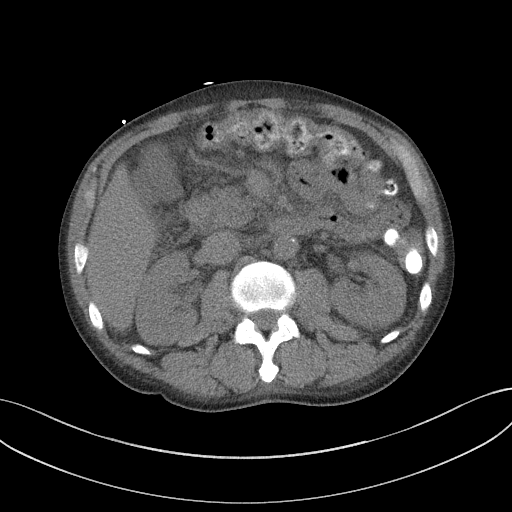
[im 59/93  bone]
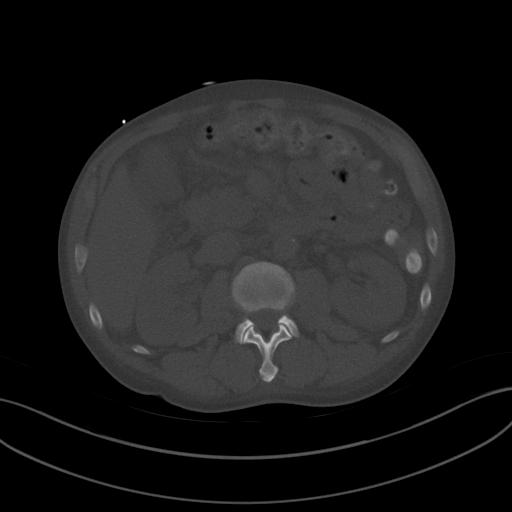
[im 67/93  soft-tissue]
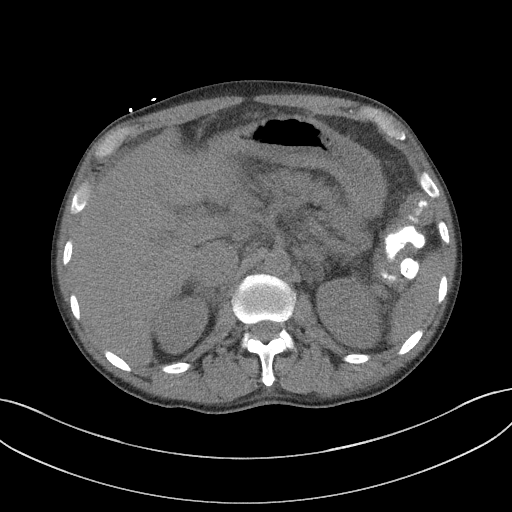
[im 74/93  soft-tissue]
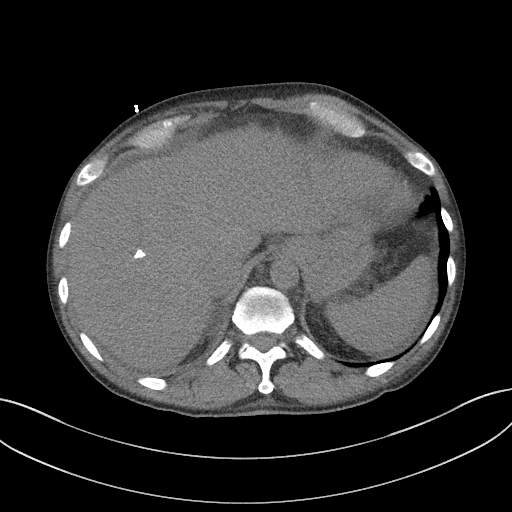
[im 81/93  soft-tissue]
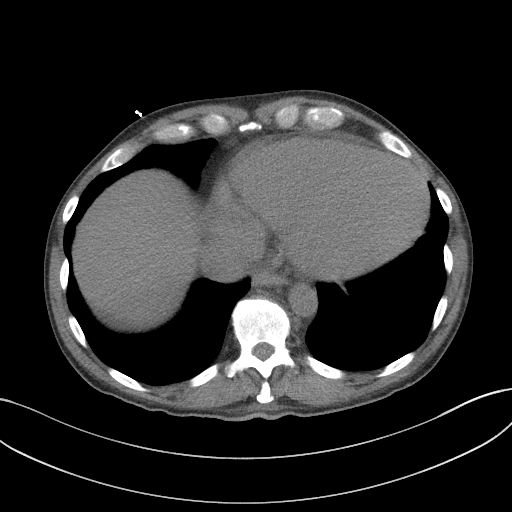
[im 89/93  soft-tissue]
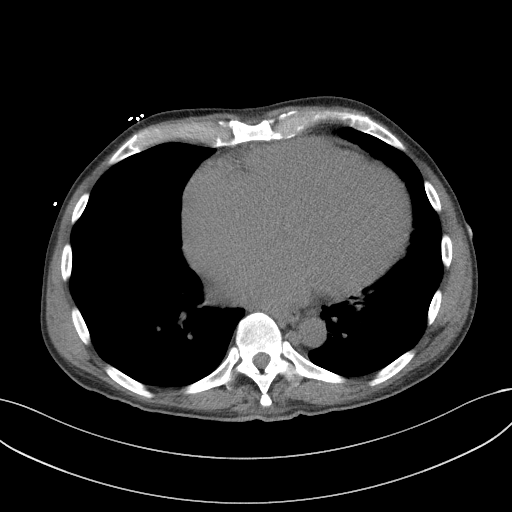

[Series 6: a/p w/o cor · coronal · non-contrast · 0.91mm/px · 3 of 147 slices shown]
[im 49/147  soft-tissue]
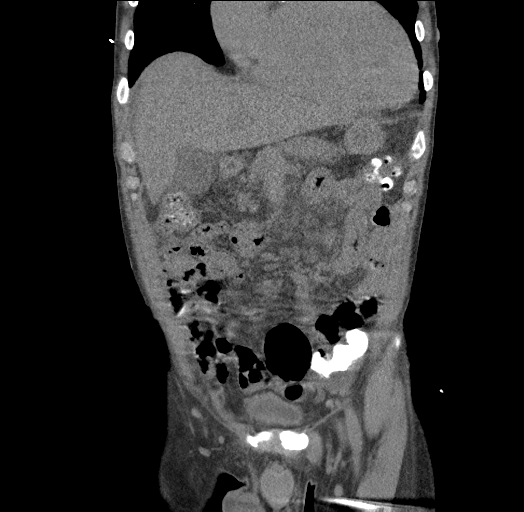
[im 65/147  soft-tissue]
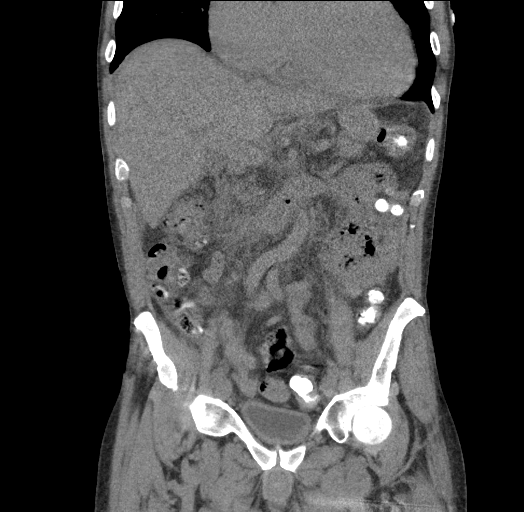
[im 82/147  soft-tissue]
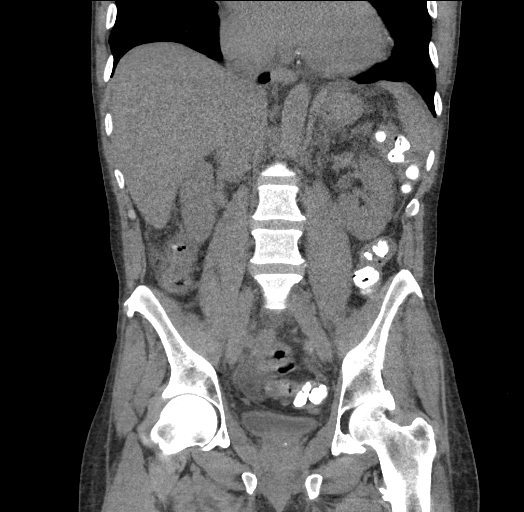

[16 of 46 positions shown; findings below may reference images not displayed]

FINDINGS: Lower chest: Cardiomegaly.  Persistent trace pericardial effusion.

Hepatobiliary: No focal liver abnormality. Gallbladder wall
thickening and haziness. No CT evidence of a calcified stone. No
biliary dilatation.

Pancreas: No focal lesion. Normal pancreatic contour. No surrounding
inflammatory changes. No main pancreatic ductal dilatation.

Spleen: Normal in size without focal abnormality.

Adrenals/Urinary Tract:

No adrenal nodule bilaterally.

No nephrolithiasis, no hydronephrosis, and no contour-deforming
renal mass. No ureterolithiasis or hydroureter.

The urinary bladder is unremarkable.

Stomach/Bowel: PO contrast reaches the rectum. Haziness as well as
question of wall thickening of the distal duodenum with associated
fat stranding and free fluid within the upper abdomen. Stomach is
within normal limits. No evidence of large bowel wall thickening or
dilatation. The appendix not definitely identified.

Vascular/Lymphatic: No abdominal aorta or iliac aneurysm. Mild
atherosclerotic plaque of the aorta and its branches. No abdominal,
pelvic, or inguinal lymphadenopathy.

Reproductive: Prostate is unremarkable.

Other: No intraperitoneal free fluid. No intraperitoneal free gas.
No organized fluid collection.

Musculoskeletal:

No abdominal wall hernia or abnormality.

No suspicious lytic or blastic osseous lesions. No acute displaced
fracture.
IMPRESSION: 1. Findings concerning for acalculous acute cholecystitis.
Differential diagnosis of nonspecific stand alone gallbladder wall
thickening includes chronic liver disease versus malignancy. A HIDA
scan could be obtained for further evaluation of likely acalculous
acute cholecystitis.
2. Haziness as well as question of wall thickening of the distal
duodenum with associated fat stranding and free fluid within the
upper abdomen. Finding may represent reactive changes versus
duodenitis.
3. Given upper abdominal fat stranding in the vicinity of the
pancreas, consider evaluation with lipase levels.
4. Trace simple free fluid ascites.
5. Markedly limited evaluation due to noncontrast study in a patient
with known malignancy.

## 2022-02-09 IMAGING — US US RENAL
1 series · 14 of 25 positions shown · non-contrast
Comparison: CT abdomen and pelvis 06/17/2021

CLINICAL DATA: Acute kidney injury, history hypertension, CHF,
smoking, gastric cancer

EXAM:
RENAL / URINARY TRACT ULTRASOUND COMPLETE

[Series 1: us renal · 14 of 26 slices shown]
[im 1/26]
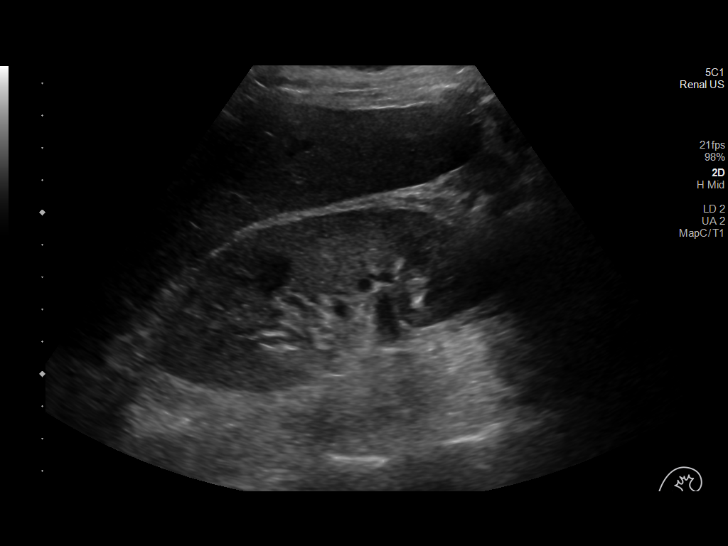
[im 3/26]
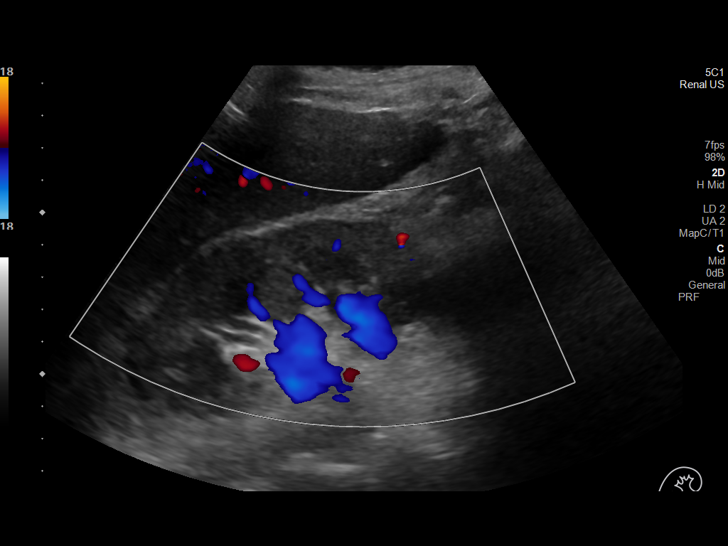
[im 5/26]
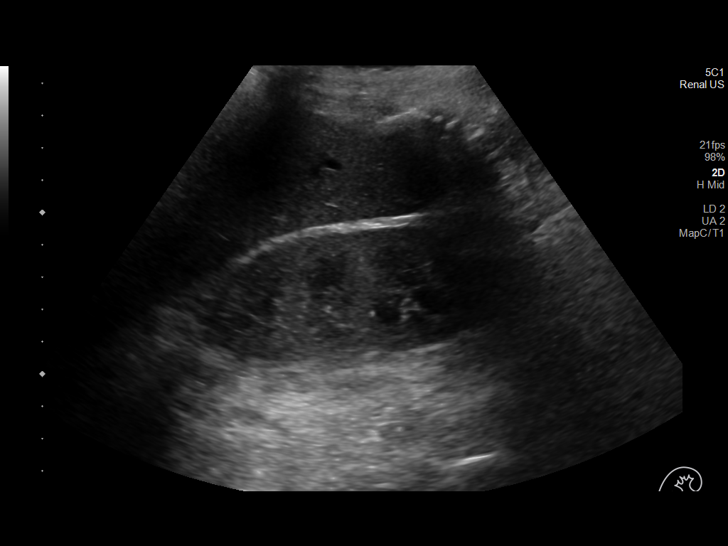
[im 7/26]
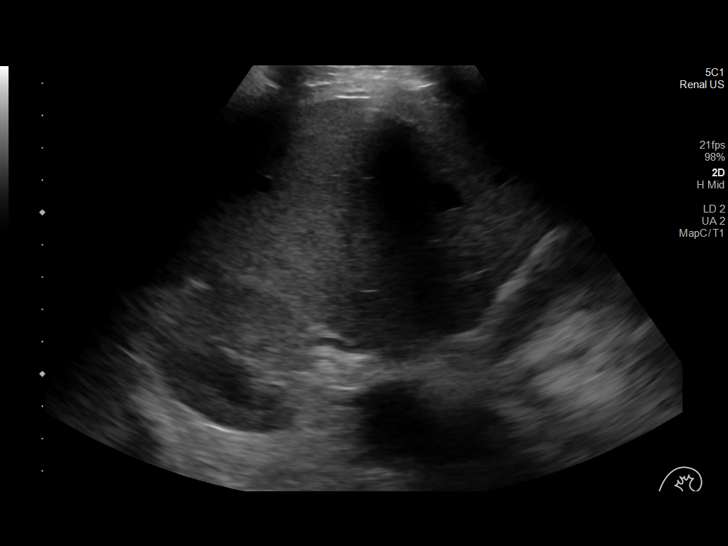
[im 9/26]
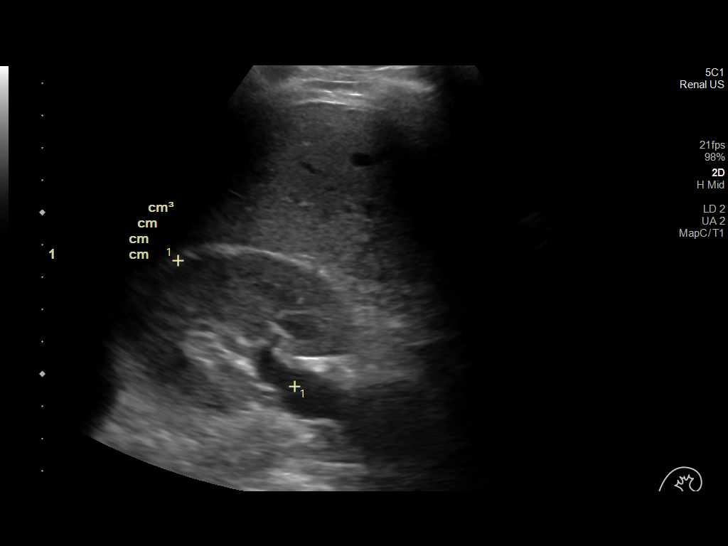
[im 10/26]
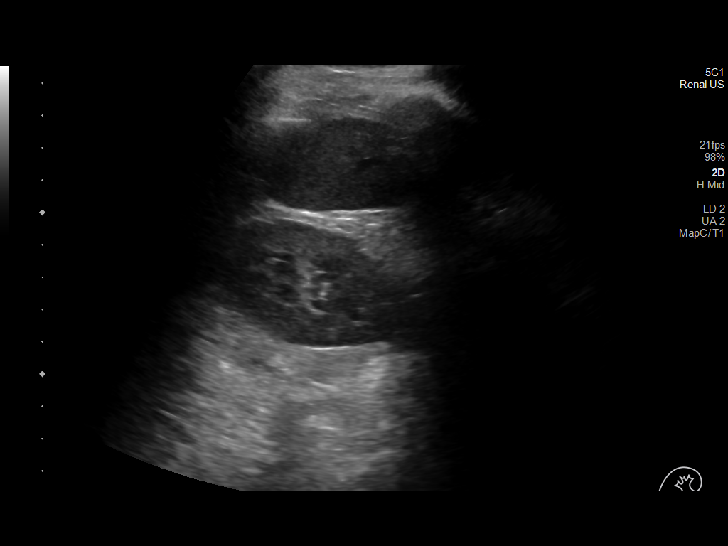
[im 12/26]
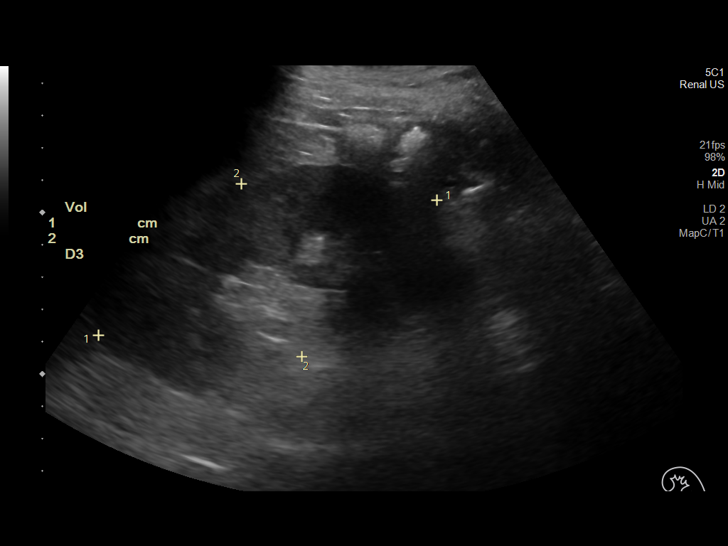
[im 14/26]
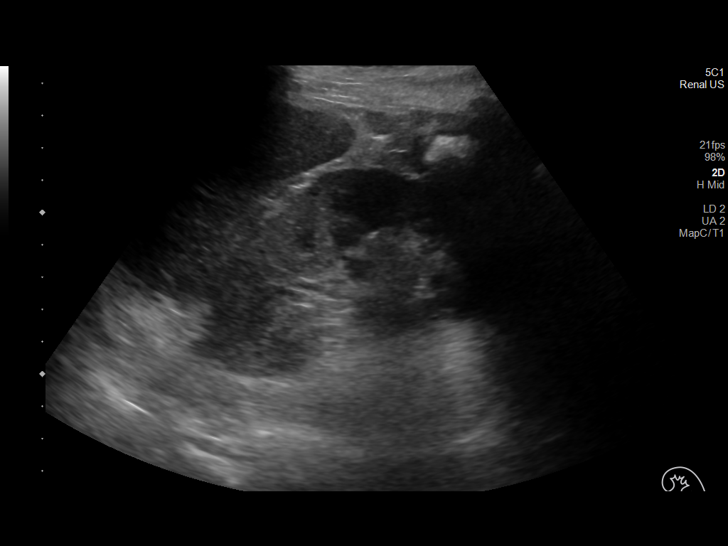
[im 16/26]
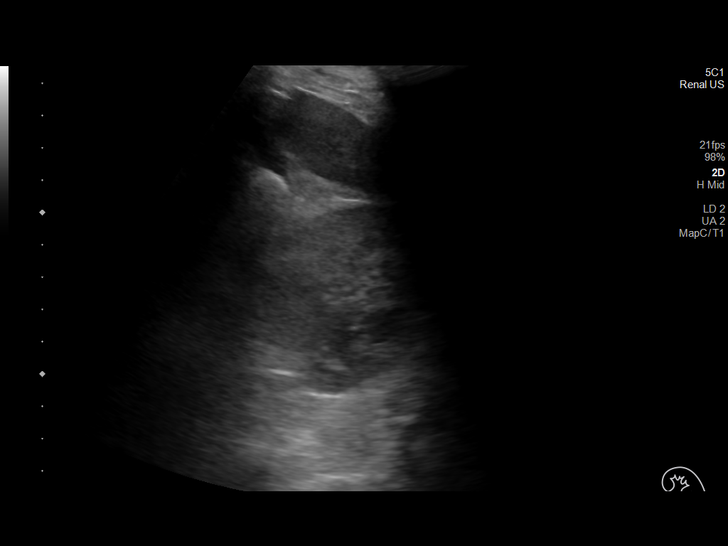
[im 17/26]
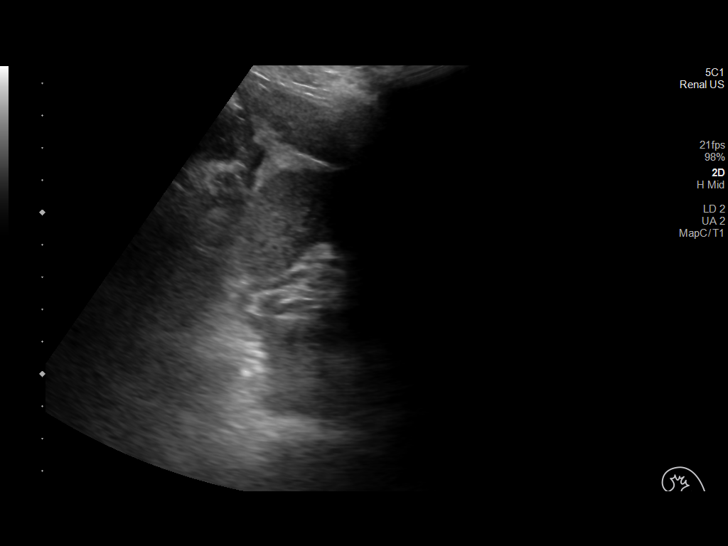
[im 19/26]
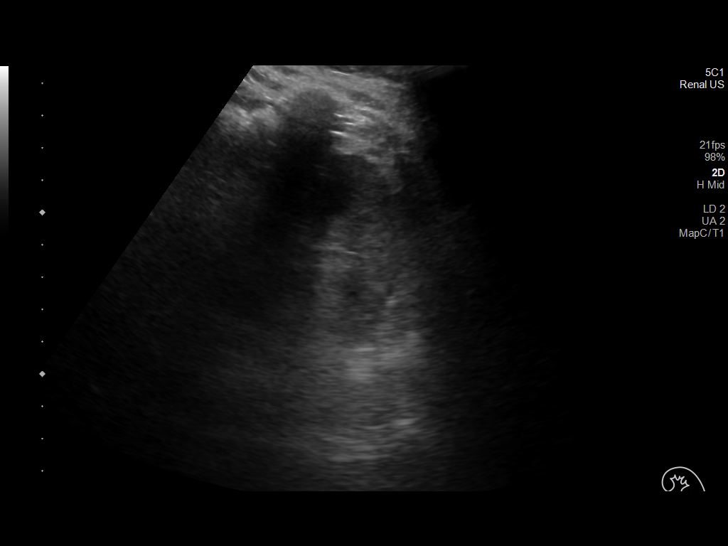
[im 21/26]
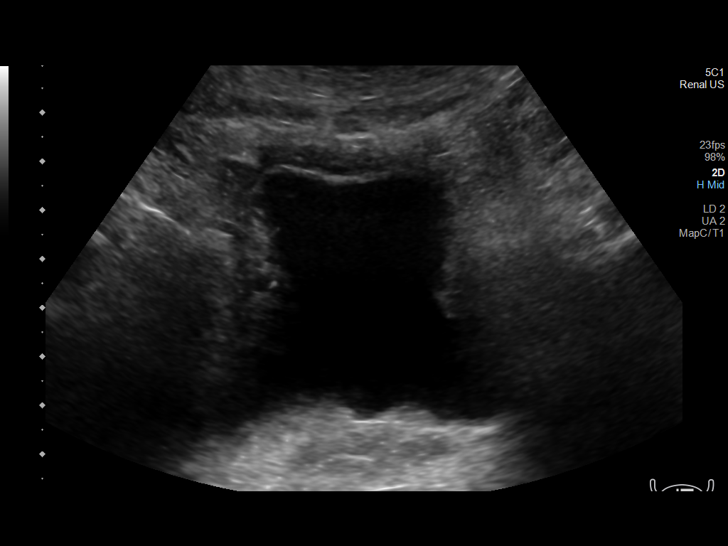
[im 23/26]
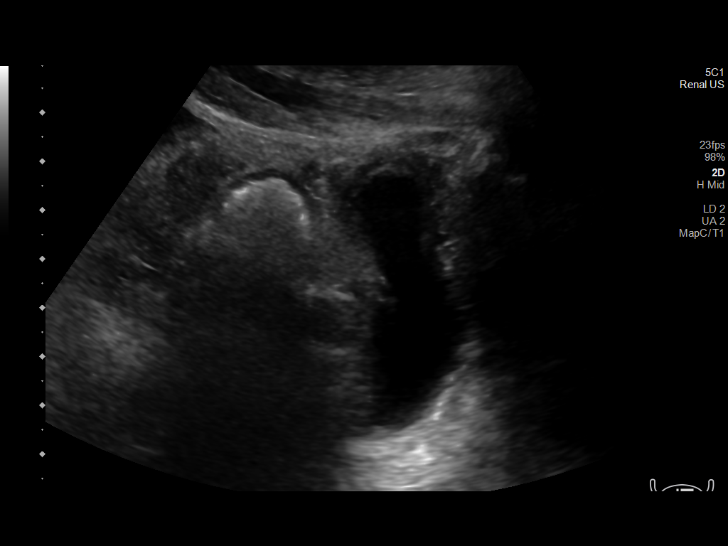
[im 26/26]
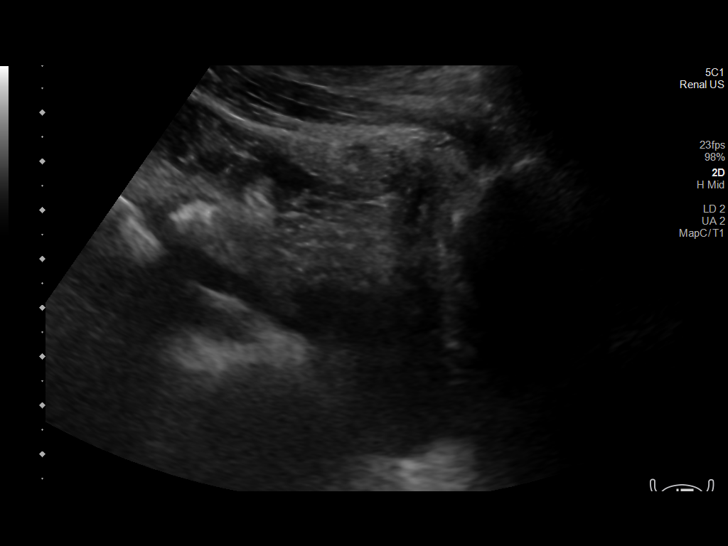

[14 of 25 positions shown; findings below may reference images not displayed]

FINDINGS: Right Kidney:

Renal measurements: 11.3 x 4.6 x 5.3 cm = volume: 144 mL. Normal
cortical thickness. Increased cortical echogenicity. No mass,
hydronephrosis, or shadowing calcification.

Left Kidney:

Renal measurements: 11.3 x 5.7 x 5.2 cm = volume: 175 mL. Less well
visualized due to suboptimal acoustic window and bowel. Normal
cortical thickness. Increased cortical echogenicity. No mass,
hydronephrosis, or shadowing calcification.

Bladder:

Incompletely distended, which may account for slight prominence of
the wall. No gross mass.

Other:

N/A
IMPRESSION: No definite renal sonographic abnormalities identified.
